# Patient Record
Sex: Male | Born: 1945
Health system: Southern US, Community
[De-identification: ages and names within clinical notes are randomized; demographics above are authoritative.]

## PROBLEM LIST (undated history)

## (undated) DIAGNOSIS — J449 Chronic obstructive pulmonary disease, unspecified: Secondary | ICD-10-CM

## (undated) DIAGNOSIS — M109 Gout, unspecified: Secondary | ICD-10-CM

## (undated) DIAGNOSIS — Z5189 Encounter for other specified aftercare: Secondary | ICD-10-CM

## (undated) DIAGNOSIS — M47816 Spondylosis without myelopathy or radiculopathy, lumbar region: Secondary | ICD-10-CM

## (undated) DIAGNOSIS — I2510 Atherosclerotic heart disease of native coronary artery without angina pectoris: Secondary | ICD-10-CM

## (undated) DIAGNOSIS — D649 Anemia, unspecified: Secondary | ICD-10-CM

## (undated) DIAGNOSIS — C801 Malignant (primary) neoplasm, unspecified: Secondary | ICD-10-CM

## (undated) DIAGNOSIS — I714 Abdominal aortic aneurysm, without rupture, unspecified: Secondary | ICD-10-CM

## (undated) DIAGNOSIS — E785 Hyperlipidemia, unspecified: Secondary | ICD-10-CM

## (undated) DIAGNOSIS — I712 Thoracic aortic aneurysm, without rupture, unspecified: Secondary | ICD-10-CM

## (undated) DIAGNOSIS — I219 Acute myocardial infarction, unspecified: Secondary | ICD-10-CM

## (undated) DIAGNOSIS — K432 Incisional hernia without obstruction or gangrene: Secondary | ICD-10-CM

## (undated) DIAGNOSIS — D72829 Elevated white blood cell count, unspecified: Secondary | ICD-10-CM

## (undated) DIAGNOSIS — I10 Essential (primary) hypertension: Secondary | ICD-10-CM

## (undated) DIAGNOSIS — K219 Gastro-esophageal reflux disease without esophagitis: Secondary | ICD-10-CM

## (undated) DIAGNOSIS — N189 Chronic kidney disease, unspecified: Secondary | ICD-10-CM

## (undated) DIAGNOSIS — E039 Hypothyroidism, unspecified: Secondary | ICD-10-CM

## (undated) HISTORY — DX: Chronic kidney disease, unspecified: N18.9

## (undated) HISTORY — DX: Spondylosis without myelopathy or radiculopathy, lumbar region: M47.816

## (undated) HISTORY — PX: JOINT REPLACEMENT: SHX530

## (undated) HISTORY — DX: Gastro-esophageal reflux disease without esophagitis: K21.9

## (undated) HISTORY — DX: Gout, unspecified: M10.9

## (undated) HISTORY — DX: Elevated white blood cell count, unspecified: D72.829

## (undated) HISTORY — DX: Atherosclerotic heart disease of native coronary artery without angina pectoris: I25.10

## (undated) HISTORY — DX: Essential (primary) hypertension: I10

## (undated) HISTORY — DX: Hypothyroidism, unspecified: E03.9

## (undated) HISTORY — DX: Hyperlipidemia, unspecified: E78.5

---

## 1995-11-14 HISTORY — PX: CORONARY ANGIOPLASTY WITH STENT PLACEMENT: SHX49

## 2001-02-26 ENCOUNTER — Encounter: Payer: Self-pay | Admitting: Cardiology

## 2001-02-26 ENCOUNTER — Ambulatory Visit (HOSPITAL_COMMUNITY): Admission: RE | Admit: 2001-02-26 | Discharge: 2001-02-26 | Payer: Self-pay | Admitting: Cardiology

## 2003-08-12 ENCOUNTER — Ambulatory Visit (HOSPITAL_COMMUNITY): Admission: RE | Admit: 2003-08-12 | Discharge: 2003-08-12 | Payer: Self-pay | Admitting: Family Medicine

## 2003-08-12 ENCOUNTER — Encounter: Payer: Self-pay | Admitting: Family Medicine

## 2004-11-13 HISTORY — PX: ABDOMINAL AORTIC ANEURYSM REPAIR: SUR1152

## 2005-05-23 ENCOUNTER — Ambulatory Visit: Payer: Self-pay

## 2005-05-29 ENCOUNTER — Encounter: Admission: RE | Admit: 2005-05-29 | Discharge: 2005-05-29 | Payer: Self-pay | Admitting: *Deleted

## 2005-06-20 ENCOUNTER — Inpatient Hospital Stay (HOSPITAL_COMMUNITY): Admission: RE | Admit: 2005-06-20 | Discharge: 2005-06-25 | Payer: Self-pay | Admitting: *Deleted

## 2005-06-20 ENCOUNTER — Encounter (INDEPENDENT_AMBULATORY_CARE_PROVIDER_SITE_OTHER): Payer: Self-pay | Admitting: *Deleted

## 2006-04-16 ENCOUNTER — Ambulatory Visit (HOSPITAL_COMMUNITY): Admission: RE | Admit: 2006-04-16 | Discharge: 2006-04-16 | Payer: Self-pay | Admitting: Urology

## 2006-11-13 HISTORY — PX: CORONARY ANGIOPLASTY WITH STENT PLACEMENT: SHX49

## 2006-11-13 HISTORY — PX: COLONOSCOPY: SHX174

## 2007-06-04 ENCOUNTER — Encounter: Payer: Self-pay | Admitting: Gastroenterology

## 2007-06-04 ENCOUNTER — Ambulatory Visit: Payer: Self-pay | Admitting: Gastroenterology

## 2007-06-04 ENCOUNTER — Ambulatory Visit (HOSPITAL_COMMUNITY): Admission: RE | Admit: 2007-06-04 | Discharge: 2007-06-04 | Payer: Self-pay | Admitting: Gastroenterology

## 2007-11-01 ENCOUNTER — Ambulatory Visit: Payer: Self-pay | Admitting: Cardiology

## 2008-11-18 ENCOUNTER — Ambulatory Visit: Payer: Self-pay | Admitting: Cardiology

## 2009-04-26 ENCOUNTER — Encounter: Payer: Self-pay | Admitting: Cardiology

## 2009-05-10 ENCOUNTER — Encounter: Payer: Self-pay | Admitting: Cardiology

## 2009-05-11 ENCOUNTER — Encounter: Payer: Self-pay | Admitting: Cardiology

## 2009-11-03 ENCOUNTER — Encounter: Payer: Self-pay | Admitting: Cardiology

## 2009-11-11 ENCOUNTER — Ambulatory Visit (HOSPITAL_COMMUNITY): Admission: RE | Admit: 2009-11-11 | Discharge: 2009-11-11 | Payer: Self-pay | Admitting: Internal Medicine

## 2010-02-02 DIAGNOSIS — Z9889 Other specified postprocedural states: Secondary | ICD-10-CM | POA: Insufficient documentation

## 2010-02-02 DIAGNOSIS — M109 Gout, unspecified: Secondary | ICD-10-CM

## 2010-02-02 DIAGNOSIS — K219 Gastro-esophageal reflux disease without esophagitis: Secondary | ICD-10-CM | POA: Insufficient documentation

## 2010-02-02 DIAGNOSIS — I10 Essential (primary) hypertension: Secondary | ICD-10-CM | POA: Insufficient documentation

## 2010-02-02 DIAGNOSIS — E039 Hypothyroidism, unspecified: Secondary | ICD-10-CM | POA: Insufficient documentation

## 2010-02-02 DIAGNOSIS — E785 Hyperlipidemia, unspecified: Secondary | ICD-10-CM | POA: Insufficient documentation

## 2010-02-02 DIAGNOSIS — I2510 Atherosclerotic heart disease of native coronary artery without angina pectoris: Secondary | ICD-10-CM

## 2010-02-09 ENCOUNTER — Ambulatory Visit: Payer: Self-pay | Admitting: Cardiology

## 2010-12-13 NOTE — Letter (Signed)
Summary: Dr Knute Neu Office Note  Dr Knute Neu Office Note   Imported By: Sallee Provencal 12/07/2009 11:41:31  _____________________________________________________________________  External Attachment:    Type:   Image     Comment:   External Document

## 2010-12-13 NOTE — Assessment & Plan Note (Signed)
Summary: f1y   Visit Type:  1 year follow up Primary Provider:  Willey Blade  CC:  No cardiac complains.  History of Present Illness: Has not been able to get out and do things because of the cold.  He is headed out on a cruise.  Denies any chest pain.    Current Medications (verified): 1)  Zocor 40 Mg Tabs (Simvastatin) .... Take 1 Tablet By Mouth Once A Day 2)  Hyzaar 50-12.5 Mg Tabs (Losartan Potassium-Hctz) .... Take 1 Tablet By Mouth Once A Day 3)  Synthroid 200 Mcg Tabs (Levothyroxine Sodium) .... Take 1 Tablet By Mouth Once A Day 4)  Metoprolol Succinate 50 Mg Xr24h-Tab (Metoprolol Succinate) .... Take 1/2 Tablet Two Times A Day 5)  Allopurinol 300 Mg Tabs (Allopurinol) .... Take 1 Tablet By Mouth Once A Day 6)  Omeprazole 20 Mg Tbec (Omeprazole) .... Take 1 Tablet By Mouth Once A Day 7)  Niaspan 750 Mg Cr-Tabs (Niacin (Antihyperlipidemic)) .... Take 1 Tablet By Mouth Two Times A Day 8)  Aspirin 81 Mg Tbec (Aspirin) .... Take One Tablet By Mouth Daily 9)  Multivitamins  Tabs (Multiple Vitamin) .... Take 1 Tablet By Mouth Once A Day 10)  Biotin 1000 Mcg Tabs (Biotin) .... Take 1 Tablet By Mouth Once A Day 11)  Cialis 20 Mg Tabs (Tadalafil) .... As Needed 12)  Aspirin 81 Mg Tbec (Aspirin) .... Take One Tablet By Mouth Daily 13)  Astaxanthin 4mg  Tablet .... Take 1 Tablet By Mouth Once A Day  Allergies (verified): 1)  ! Penicillin  Vital Signs:  Patient profile:   65 year old male Height:      72 inches Weight:      203.75 pounds BMI:     27.73 Pulse rate:   66 / minute Pulse rhythm:   regular Resp:     18 per minute BP sitting:   132 / 80  (left arm) Cuff size:   large  Vitals Entered By: Sidney Ace (February 09, 2010 12:27 PM)  Physical Exam  General:  Well developed, well nourished, in no acute distress. Head:  normocephalic and atraumatic Eyes:  PERRLA/EOM intact; conjunctiva and lids normal. Lungs:  Clear bilaterally to auscultation and percussion. Heart:  PMI non  displaced.  Normal S1 and S2.  No murmur.   Abdomen:  Small hernia in upper abdomen. Msk:  Back normal, normal gait. Muscle strength and tone normal. Pulses:  pulses normal in all 4 extremities Extremities:  No clubbing or cyanosis. Neurologic:  Alert and oriented x 3.   EKG  Procedure date:  02/09/2010  Findings:      NSR.  Nonspecific ST and T abnormaltiy.  Impression & Recommendations:  Problem # 1:  CORONARY ARTERY DISEASE (ICD-414.00) He has remained stable.  No symtoms.  Continue current medications.  His updated medication list for this problem includes:    Metoprolol Succinate 50 Mg Xr24h-tab (Metoprolol succinate) .[name]... Take 1/2 tablet two times a day    Aspirin 81 Mg Tbec (Aspirin) .[name]... Take one tablet by mouth daily  Orders: EKG w/ Interpretation (93000)  Problem # 2:  HYPERTENSION, UNSPECIFIED (ICD-401.9) Controlled. His updated medication list for this problem includes:    Hyzaar 50-12.5 Mg Tabs (Losartan potassium-hctz) .[name]... Take 1 tablet by mouth once a day    Metoprolol Succinate 50 Mg Xr24h-tab (Metoprolol succinate) .[name]... Take 1/2 tablet two times a day    Aspirin 81 Mg Tbec (Aspirin) .[name]... Take one tablet by mouth daily  Problem # 3:  HYPERLIPIDEMIA (B2193296.4) Followed nicely by Dr. Willey Blade His updated medication list for this problem includes:    Zocor 40 Mg Tabs (Simvastatin) .[name]... Take 1 tablet by mouth once a day    Niaspan 750 Mg Cr-tabs (Niacin (antihyperlipidemic)) .[name]... Take 1 tablet by mouth two times a day  Patient Instructions: 1)  Your physician recommends that you schedule a follow-up appointment as needed with Dr Lia Foyer 2)  Your physician recommends that you continue on your current medications as directed. Please refer to the Current Medication list given to you today.

## 2011-03-28 NOTE — Op Note (Signed)
NAME:  Jeremy Johnson, Jeremy Johnson               ACCOUNT NO.:  [id number]   MEDICAL RECORD NO.:  [id number]          PATIENT TYPE:  AMB   LOCATION:  DAY                           FACILITY:  [facility]   PHYSICIAN:  Caro Hight, M.D.      DATE OF BIRTH:  1946-01-11   DATE OF PROCEDURE:  06/04/2007  DATE OF DISCHARGE:                               OPERATIVE REPORT   REFERRING PHYSICIAN:  Paula Compton. Willey Blade, MD   PROCEDURE:  Colonoscopy with snare cautery polypectomy.   INDICATION FOR EXAM:  Jeremy Johnson is a 65 year old male who presents for  average-risk colon cancer screening.   FINDINGS:  1. A 6-mm descending colon polyp removed via snare cautery.  Otherwise      no masses, inflammatory changes, diverticula or AVMs seen.  2. Normal retroflexed view of the rectum.   RECOMMENDATIONS:  1. No NSAIDs, aspirin or any anticoagulation for 5 days.  2. He should follow a high-fiber diet.  He is given a handout on high-      fiber diet and polyps.  3. We will call Jeremy Johnson with the results of his biopsies.  If this      polyp is adenomatous, then his next screening colonoscopy should be      in 5 years.  If his polyp is adenomatous, then his siblings and      children should have a screening colonoscopy at age 82 and then      every 10 years.   MEDICATIONS:  1. Demerol 75 mg IV.  2. Versed 5 mg IV.   PROCEDURE TECHNIQUE:  Physical exam was performed and informed consent  was obtained from the patient after explaining the benefits, risks and  alternatives to the procedure.  The patient was connected to the monitor  and placed in the left lateral position.  Continuous oxygen was provided  by nasal cannula and IV medicine administered through an indwelling  cannula.  After administration of sedation and rectal exam, the  patient's rectum was intubated  and the scope was advanced under direct visualization to the cecum.  The  scope was removed slowly by carefully examining the color, texture,  anatomy and  integrity of the mucosa on the way out.  The patient was  recovered in endoscopy and discharged home in satisfactory condition.      Caro Hight, M.D.  Electronically Signed     SM/MEDQ  D:  06/04/2007  T:  06/04/2007  Job:  [id number]   cc:   Paula Compton. Willey Blade, MD  Fax: [phone]

## 2011-03-28 NOTE — Letter (Signed)
November 01, 2007    Jeremy Johnson. Jeremy Blade, MD  [address]  [place], [place] [address]   RE:  [name], [name]  MRN:  [id number]  /  DOB:  [birth year]   Dear Jeremy Johnson:   I had the pleasure of seeing Jeremy Johnson in the office today in followup.  He clinically is stable.  He is doing quite well.  He has had an  abdominal aortic aneurysm resection.  He denies any significant chest  pain.  I know that he saw you recently, and you suggested the  possibility of him coming in to be seen.  Overall, his most recent lipid  numbers that we have have been really quite good.  The numbers from your  office revealed a glucose of 104, BUN 17, creatinine 1.45.  His  cholesterol was 120 with a triglycerides of 71, HDL of 41, and an LDL of  65.   MEDICATIONS:  1. Zocor 40 mg daily.  2. Hyzaar 50/12.5 daily.  3. Synthroid 200 mcg daily.  4. Aspirin 325 mg daily.  5. Metoprolol 50 mg one-half tablet p.o. b.i.d.  6. Niaspan 750 mg two tablets daily.  7. Allopurinol 300 mg daily.  8. Omeprazole 20 mg daily.   PHYSICAL EXAMINATION:  GENERAL:  He is alert and oriented in no acute  distress.  VITAL SIGNS:  Blood pressure 130/80, pulse 64 and regular.  LUNGS:  The lung fields are clear to auscultation and percussion.  CARDIOVASCULAR:  I do not appreciate a significant murmur at the present  time.  ABDOMEN:  He has a large abdominal scar.   The electrocardiogram demonstrates normal sinus rhythm and essentially  within normal limits.   In summary, Jeremy Johnson has underlying coronary artery disease last  studied in 2002.  At that time he had a 30% LAD lesion, 40 and 50%  disease of the circumflex at the b bifurcation, and a small nondominant  right coronary with 40-50% narrowing.  There was an ejection fraction of  56%.  He had a moderate infrarenal abdominal aortic aneurysm which has  now been resected and was not a candidate for stent grafting.  He has  been on dual lipid management therapy which includes  Zocor and Niaspan.  His numbers are so good.  He has flushing with Niaspan, and given the  potential for drug interaction, I suggested that he decrease his Niaspan  to 750 mg once daily as opposed to twice and get these numbers rechecked  in your office in about two months.  I have also suggested that he could  potentially decrease his aspirin to 81 mg daily.   We will see him back in followup in one year.  Please do not hesitate to  let me know if I can be of help in his management.    Sincerely,      Jeremy Johnson. Jeremy Foyer, MD, [facility]  Electronically Signed    TDS/MedQ  DD: 11/01/2007  DT: 11/03/2007  Job #: [id number]

## 2011-03-28 NOTE — Letter (Signed)
November 18, 2008    Paula Compton. Willey Blade, MD  [address]  [place], [place] [address]   RE:  MONTARIUS, GRIBBINS  MRN:  [id number]  /  DOB:  01/23/1946   Dear Carloyn Manner,   I had the pleasure of seeing the nice patient, Jeremy Johnson in the office  today in followup.  He really seems to be getting along quite well.  He  did recently lose his brother-in-law, but other than that he has been  relatively stable.  He denies any ongoing chest pain or any significant  cardiac symptoms.  As you know, his last catheterization was in 2002.  He has also had an abdominal aortic aneurysm repair.   CURRENT MEDICATIONS:  1. Zocor 40 mg daily.  2. Hyzaar 50/12.5 daily.  3. Synthroid 200 mcg daily.  4. Metoprolol 50 mg one half b.i.d.  5. Allopurinol 300 mg daily.  6. Omeprazole 20 mg daily.  7. Niaspan 750 mg daily.  8. Aspirin 81 mg daily.  9. Cialis p.r.n..   PHYSICAL EXAMINATION:  GENERAL:  He is alert and oriented, in no acute  distress.  VITAL SIGNS:  Blood pressure is 151/88, he says that at home it is much  lower, and pulse is 78.  LUNGS:  Lung fields are clear.  CARDIAC:  Rhythm is regular.  I do not appreciate a significant murmur.   EKG reveals normal sinus rhythm and minor nonspecific ST abnormality.   Overall, he appears to be doing well.  We plan to see him back in  followup in about 1 year, should have any problems in the interim,  please do not hesitate to let me know and I will be happy to see him at  any time.    Sincerely,      Loretha Brasil. Lia Foyer, MD, [facility]  Electronically Signed    TDS/MedQ  DD: 11/18/2008  DT: 11/19/2008  Job #: [id number]

## 2011-03-31 NOTE — Discharge Summary (Signed)
[name], [name]               ACCOUNT NO.:  [id number]   MEDICAL RECORD NO.:  [id number]          PATIENT TYPE:  INP   LOCATION:  2022                         FACILITY:  [facility]   PHYSICIAN:  Jeremy Johnson, M.D.    DATE OF BIRTH:  05-01-1946   DATE OF ADMISSION:  06/20/2005  DATE OF DISCHARGE:  06/25/2005                                 DISCHARGE SUMMARY   PRIMARY ADMISSION DIAGNOSIS:  Abdominal aortic aneurysm.   ADDITIONAL/DISCHARGE DIAGNOSES:  1.  Abdominal aortic aneurysm.  2.  Coronary artery disease, status post myocardial infarction in 1997.  3.  Hypertension.  4.  Hyperlipidemia.  5.  Hypothyroidism.  6.  Gout.   PROCEDURES PERFORMED:  Repair of infrarenal abdominal aortic aneurysm with  18 mm straight Dacron Hemashield graft.   HISTORY:  The patient is a 65 year old white male with a history of known  abdominal aortic aneurysm which has been followed by Dr. Amedeo Johnson since 2002.  He has had routine surveillance ultrasounds over the past several years, and  during this period of time the aneurysm has progressively grown now to a 5  cm diameter.  On his most recent visit, Dr. Amedeo Johnson ordered a CT scan  ________________5.9 cm maximal diameter abdominal aortic aneurysm.  The  patient was not felt to be a good candidate for stent graft repair.  It was  recommended that he proceed with open surgical repair at this time.  Because  of his previous history of coronary artery disease, he underwent a  Cardiolite prior to surgery which showed him to be low risk for surgery and  he was cleared to proceed.  Prior to his admission, Dr. Amedeo Johnson explained the  risks, benefits, and the alternatives to the procedure, and the patient did  consent to surgery at this time.   HOSPITAL COURSE:  The patient was admitted to [facility] on June 20, 2005, underwent repair of his abdominal aortic aneurysm by Dr. Amedeo Johnson as  described in detail above.  He tolerated the procedure well, was  extubated  intraoperatively, and transferred to the SICU in stable condition.  On  postoperative day #1, he was doing well.  He was hemodynamically stable and  was felt to be ready for transfer to the floor.  He has slowly made  progress.  By postoperative day #3, he was passing flatus and had active  bowel sounds.  He was started on a clear liquid diet.  He has remained  afebrile and all vital signs have been stable.  The surgical incision sites  are all healing well.  He is ambulating in the halls without difficulty.  It  is anticipated that his bowel function will continue to return over the next  24 to 48 hours, and as this occurs, his diet will be advanced accordingly.  Once he is tolerating a regular diet without difficulty, is having normal  bowel function, it is anticipated that he will be ready for discharge home  hopefully on June 25, 2005.  His most recent labs show a hemoglobin of 11,  hematocrit 33, white count  15, platelets 152.  Sodium 136, potassium 3.4,  BUN 28, creatinine 1.1.   DISCHARGE MEDICATIONS:  1.  Tylox one to two q.4h. p.r.n. pain.  2.  Niaspan 500 mg daily.  3.  Aspirin 325 mg daily.  4.  Zocor 40 mg daily.  5.  Hyzaar 50/12.5 mg daily.  6.  Metoprolol 50 mg 1/2 tab b.i.d.  7.  Allopurinol 100 mg daily.  8.  Synthroid 175 mcg daily.   DISCHARGE INSTRUCTIONS:  1.  He is asked to refrain from driving, heavy lifting, or strenuous      activity.  2.  He may continue ambulating daily and using his incentive spirometer.  3.  He may shower daily and clean his incisions with soap and water.  4.  He will continue his same preoperative diet.   DISCHARGE FOLLOWUP:  1.  He will see Dr. Amedeo Johnson back in the office on July 24, 2005, at 3:20      p.m.  2.  He will return to the CVTS office on Friday, June 30, 2005, at 10 a.m.      for staple removal and wound re-check by the nurse.  He will contact our      office in the interim if he experiences any problems  or has questions.      Jeremy Johnson, P.A.      Jeremy Johnson, M.D.  Electronically Signed    GC/MEDQ  D:  06/23/2005  T:  06/24/2005  Job:  [id number]   cc:   Jeremy [place], [place]  [place] [address]  Fax: Jeremy Johnson, M.D. [facility]  [address] [place]  [place] [address]

## 2011-03-31 NOTE — H&P (Signed)
NAME:  Jeremy Johnson, Jeremy Johnson               ACCOUNT NO.:  [id number]   MEDICAL RECORD NO.:  [id number]          PATIENT TYPE:  INP   LOCATION:  NA                           FACILITY:  [facility]   PHYSICIAN:  Dorothea Glassman, M.D.    DATE OF BIRTH:  Jul 20, 1946   DATE OF ADMISSION:  06/20/2005  DATE OF DISCHARGE:                                HISTORY & PHYSICAL   PRIMARY CARE PHYSICIAN:  Dr. Avelino Leeds   CARDIOLOGIST:  Dr. Bing Quarry   CHIEF COMPLAINT:  Abdominal aortic aneurysm.   HISTORY OF PRESENT ILLNESS:  This is a 65 year old Caucasian male who has  been followed with routine visits and ultrasounds since 2002 by Dr. Amedeo Plenty  for his known AAA.  Over this period of time he has remained stable and the  aneurysm has slowly, but progressively grown to 5 cm in maximal diameter.  Secondary to enlargement over the previous six months Dr. Amedeo Plenty ordered a CT  scan and a Cardiolite evaluation.  The CT verified a 5.9 cm maximal diameter  AAA.  He was not a candidate for a stent graft.  A Cardiolite revealed to be  a low risk preoperative study.  The patient was then scheduled for elective  repair of his abdominal aortic aneurysm secondary to enlargement.  Carotid  duplex and lower extremity arterial evaluation was performed on June 16, 2005 at [place] office.  Carotid duplex revealed no evidence of internal or  external carotid artery stenosis bilaterally and the lower extremity  arterial evaluation revealed no evidence of significant arterial occlusive  disease bilaterally.  Patient does complain of occasional reflux symptoms.  Otherwise, he denies abdominal pain, nausea, vomiting, constipation,  diarrhea, hematochezia, hematemesis, back pain, claudication symptoms,  dysuria, hematuria, angina, palpitations, and TIA/CVA symptoms.  Patient  presents to the CVTS office today for history and physical examination prior  to surgery.   PAST MEDICAL HISTORY:  1.  Coronary artery disease status post  myocardial infarction 1997, status      post percutaneous intervention.  2.  Hypertension.  3.  Abdominal aortic aneurysm.  4.  Hyperlipidemia.  5.  Hypothyroid.  6.  Gout in his bilateral feet.   PAST SURGICAL HISTORY:  None.   ALLERGIES:  PENICILLIN which causes a rash.   MEDICATIONS:  1.  Niaspan 1500 mg daily.  2.  Zocor 40 mg daily.  3.  Metoprolol 50 mg one-half tablet b.i.d.  4.  Hyzaar 50 per 12.5 mg daily.  5.  Synthroid 175 mcg daily.  6.  Aspirin 325 mg daily.  7.  Allopurinol 100 mg daily.   REVIEW OF SYSTEMS:  Please see HPI for significant positives and negatives,  otherwise negative for diabetes mellitus and kidney disease.   SOCIAL HISTORY:  This is a married adult with two children who lives with  his family.  Patient continues to smoke a half a pack per day and has done  so for greater than 30 years.  Patient admits to occasional alcohol use.  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   FAMILY HISTORY:  Significant only in his sister for diabetes mellitus.   PHYSICAL EXAMINATION:  VITAL SIGNS:  Blood pressure 138/88 left arm sitting,  heart rate 60, respirations 18.  GENERAL:  This is a 65 year old Caucasian male in no acute distress.  HEENT:  Normocephalic, atraumatic.  Pupils are equal, round, and reactive to  light and accommodation.  Extraocular movements are intact.  The oral mucosa  is pink and moist and the sclerae are non-icteric.  NECK:  Supple with no JVD, no bruits, no lymphadenopathy.  The carotids are  palpable.  LUNGS:  Respirations are symmetric, unlabored, and clear.  CARDIAC:  Regular rate and rhythm.  No murmurs.  No rubs.  No gallops.  ABDOMEN:  Soft, nontender, nondistended with normoactive bowel sounds and  there is a palpable abdominal aortic aneurysm.  There are no bruits  auscultated, however.  GENITOURINARY:  Deferred.  EXTREMITIES:  There is no  edema evident or varicosities, venous stasis  changes, or skin breakdown.  Temperature is warm in all extremities.  Pulses  radial, femoral, popliteal, and pedal pulses are 2+ bilaterally.  NEUROLOGIC:  Nonfocal.  Patient is alert and oriented x3.  The gait is  steady.  Muscle strength is 5/5 throughout all extremities and symmetric.  Deep tendon reflexes are 2+.   ASSESSMENT:  Abdominal aortic aneurysm.   PLAN:  Resection and grafting of abdominal aortic aneurysm on June 20, 2005  by Dr. Amedeo Plenty.  Dr. Amedeo Plenty has seen and evaluated this patient prior to this  admission and has explained the risks and benefits of the procedure and the  patient has agreed to continue.       AY/MEDQ  D:  06/16/2005  T:  06/16/2005  Job:  [id number]

## 2011-03-31 NOTE — Cardiovascular Report (Signed)
[facility]  Patient:    Jeremy Johnson, Jeremy Johnson                      MRN: [id number] Adm. Date:  [id number] Attending:  Wadie Lessen CC:         Marjean Donna, M.D.  Gordy Clement, M.D.  Loretha Brasil. Lia Foyer, M.D. [facility]  CV laboratory  Patients record   Cardiac Catheterization  PROCEDURES: 1. Left heart catheterization. 2. Selective coronary arteriography. 3. Selective left ventriculography. 4. Distal aortography.  INDICATIONS:  Mr. Schlueter is a delightful 65 year old well known to me.  He has had a prior percutaneous intervention following an inferolateral MI with spontaneous reperfusion.  He had a modestly abnormal Cardiolite scan and because of that was brought back to the catheterization laboratory.  He has had some recent chest tightness.  The current study was done to assess coronary anatomy.  DESCRIPTION OF PROCEDURE:  The procedure was performed from the right femoral artery using 6 French catheter.  He tolerated the procedure well.  Because of some coiling of the wire in the intrarenal abdominal aorta, we elected to take a picture of the distal aorta.  There was evidence of abdominal aortic aneurysm.  The procedure was completed without complication.  The patient was called to Dr. Gordy Clement for evaluation of the patients aneurysm.  He was taken to the holding area in satisfactory clinical condition.  RESULTS:  Hemodynamic data: 1. Central aortic pressures were 135/77. 2. LV pressure 143/11. 3. There is no gradient on pullback across the aortic valve.  Angiographic data: 1. Left ventriculography was performed in the RAO projection.  There  was    infrabasilar hypokinesis.  Ejection fraction was calculated at 56%.  There    was no significant mitral regurgitation noted. 2. The aorta demonstrated a moderate infrarenal abdominal aortic aneurysm    which appeared to be somewhat bilobed.  The size was uncertain. 3. The  left main coronary artery was free of critical disease. 4. The LAD demonstrates about a 30% area of narrowing in the mid-vessel at the    bifurcation of the diagonal.  No critical disease is noted in the LAD.    There is some mild circumflex calcification. 5. The circumflex demonstrates about 40% narrowing through the segment of the    stent leading into the first marginal branch.  The first marginal branch    has about a 50% proximal stenosis.  The distal vessel is a dominant vessel    and shows no critical disease.  There is minor luminal irregularity. 6. There is a small ramus intermedius vessel that bifurcates and was free of    critical disease. 7. The right coronary artery is a nondominant vessel.  It bifurcates into an    acute marginal and AV branch.  The acute marginal has about 40-50%    narrowing.  There was some proximal spasm not seen on the initial shots but    later noted at the tip of the catheter.  This was not felt to be    significant.  IMPRESSION: 1. No evidence of significant restenosis. 2. Abdominal aortic aneurysm, new finding.  PLAN:  Continue medical treatment and follow up with Dr. Marjean Donna. Referral to Dr. Gordy Clement, CVTS, for evaluation of possible abdominal aortic aneurysm management. DD:  02/26/01 TD:  02/27/01 Job: [id number]

## 2011-03-31 NOTE — Op Note (Signed)
NAME:  Jeremy Johnson, Jeremy Johnson               ACCOUNT NO.:  [id number]   MEDICAL RECORD NO.:  [id number]          PATIENT TYPE:  INP   LOCATION:  2304                         FACILITY:  [facility]   PHYSICIAN:  Dorothea Glassman, M.D.    DATE OF BIRTH:  [birth year]   DATE OF PROCEDURE:  06/20/2005  DATE OF DISCHARGE:                                 OPERATIVE REPORT   SURGEON:  Gordy Clement, MD.   ASSISTANT:  Leta Baptist, PA.   ANESTHETIC:  General endotracheal.   ANESTHESIOLOGIST:  Nelda Severe. Tobias Alexander, M.D.   PREOPERATIVE DIAGNOSIS:  A 5.8-cm infrarenal abdominal aortic aneurysm.   POSTOPERATIVE DIAGNOSIS:  A 5.8-cm infrarenal abdominal aortic aneurysm.   PROCEDURE:  Repair of infrarenal abdominal aortic aneurysm with 18-mm  straight Dacron Hemashield graft.   CLINICAL NOTE:  Jeremy Johnson is a 65 year old male with a known  abdominal aortic aneurysm.  He has been followed in the office for some  time.  He recently presented with an aneurysm to have enlarged to 5.8 cm.  He has undergone a CT scan with CT angiogram.  Reconstructions revealed a  wide neck, he is not a candidate for a stent graft.  He is brought to the  operating room at this time for open operative repair.  Risks the operative  procedure with a major morbidity and mortality of approximately 5%  have  been discussed with the patient.   PROCEDURAL NOTE:  The patient was brought to the operating room in stable  condition.  Placed in a supine position.  General endotracheal anesthesia  induced.  Foley catheter, arterial line and Swan-Ganz catheter in place.   In a supine position, general endotracheal anesthesia induced.  The abdomen  and both legs were prepped and draped in a sterile fashion.   A longitudinal skin incision was made from xiphoid to umbilicus.  Dissection  carried down through subcutaneous tissue with electrocautery.  Linea alba  incised.  Peritoneal cavity entered without difficulty.   Full laparotomy  evaluation carried out.  The stomach and duodenum were  normal.  Liver, gallbladder, bile ducts and pancreas all normal.   The small bowel was unremarkable.  Large bowel revealed no masses.  There  were a few filmy adhesions noted in the right lower quadrant.   The retroperitoneum did reveal a large abdominal aortic aneurysm, this was  consistent with 5.8 cm.   The small bowel was retracted to the right, the transverse colon brought  superiorly.  The retroperitoneum incised over the neck of the aneurysm sac.  The inferior mesenteric vein, the left renal vein were both skeletonized and  retracted superiorly.  The neck of the aneurysm exposed up to the renal  arteries.  There was an adequate infrarenal neck for an infrarenal clamp  placement.  Dissection then carried distally down along the aneurysm sac to  the bifurcation.  The common iliac arteries bilaterally were exposed and  encircled Vesseloops.  These were normal in caliber.  Minimal plaque disease  was present in the iliac arteries.   The patient was administered 5000 units of  heparin intravenously, 25 grams  of mannitol intravenously.  The infrarenal aorta controlled with an aortic  DeBakey clamp.  The common iliac arteries bilaterally controlled with  coarctation clamps.   A longitudinal incision was made through the aneurysm sac.  Backbleeding  inferior mesenteric artery was controlled with a figure-of-eight 2-0 silk  suture.  Backbleeding lumbar vessels were controlled with figure-of-eight 2-  0 silk suture.   The aneurysm sac was opened up to the neck where it was T'd.  Down to the  bifurcation, it was also T'd.   An 18-mm straight graft was chosen.  This was Hemashield graft.  This was  anastomosed end-to-end to the infrarenal aorta using running 3-0 Prolene  suture.  At completion of the proximal anastomosis, the graft was flushed.  Proximal clamp removed and the graft controlled with a Fogarty clamp.   The attention  then placed on the bifurcation.  The graft was divided  transversely.  The graft was anastomosed end-to-end to the aortic  bifurcation using running 3-0 Prolene suture.  At completion of the distal  anastomosis, the graft was flushed.  The iliac arteries back-flushed.  Clamps were then removed and excellent flow present through the graft.  The  legs were well-perfused.  There were no apparent complications.  The patient  was administered 50 mg of protamine intravenously.  Adequate hemostasis  obtained.   The aneurysm sac was closed over the graft using running 2-0 Vicryl suture.   The abdomen was examined to ensure there were no retained instruments or  sponges.  The midline fascia closed using a running #1 PDS suture.  Skin  closed with staples.  Sterile dressings applied.   The patient tolerated the procedure well.  Transferred to recovery room in  stable condition.       PGH/MEDQ  D:  06/20/2005  T:  06/21/2005  Job:  [id number]

## 2011-11-24 DIAGNOSIS — E039 Hypothyroidism, unspecified: Secondary | ICD-10-CM | POA: Diagnosis not present

## 2011-11-24 DIAGNOSIS — E785 Hyperlipidemia, unspecified: Secondary | ICD-10-CM | POA: Diagnosis not present

## 2011-11-30 DIAGNOSIS — Z23 Encounter for immunization: Secondary | ICD-10-CM | POA: Diagnosis not present

## 2011-11-30 DIAGNOSIS — E785 Hyperlipidemia, unspecified: Secondary | ICD-10-CM | POA: Diagnosis not present

## 2011-11-30 DIAGNOSIS — E039 Hypothyroidism, unspecified: Secondary | ICD-10-CM | POA: Diagnosis not present

## 2012-04-22 ENCOUNTER — Encounter: Payer: Self-pay | Admitting: Gastroenterology

## 2012-05-24 DIAGNOSIS — E785 Hyperlipidemia, unspecified: Secondary | ICD-10-CM | POA: Diagnosis not present

## 2012-05-24 DIAGNOSIS — Z79899 Other long term (current) drug therapy: Secondary | ICD-10-CM | POA: Diagnosis not present

## 2012-05-24 DIAGNOSIS — I2510 Atherosclerotic heart disease of native coronary artery without angina pectoris: Secondary | ICD-10-CM | POA: Diagnosis not present

## 2012-05-24 DIAGNOSIS — E039 Hypothyroidism, unspecified: Secondary | ICD-10-CM | POA: Diagnosis not present

## 2012-05-24 DIAGNOSIS — Z125 Encounter for screening for malignant neoplasm of prostate: Secondary | ICD-10-CM | POA: Diagnosis not present

## 2012-06-10 DIAGNOSIS — E785 Hyperlipidemia, unspecified: Secondary | ICD-10-CM | POA: Diagnosis not present

## 2012-06-10 DIAGNOSIS — E039 Hypothyroidism, unspecified: Secondary | ICD-10-CM | POA: Diagnosis not present

## 2012-06-10 DIAGNOSIS — I2510 Atherosclerotic heart disease of native coronary artery without angina pectoris: Secondary | ICD-10-CM | POA: Diagnosis not present

## 2012-06-10 DIAGNOSIS — Z1212 Encounter for screening for malignant neoplasm of rectum: Secondary | ICD-10-CM | POA: Diagnosis not present

## 2012-08-16 DIAGNOSIS — Z23 Encounter for immunization: Secondary | ICD-10-CM | POA: Diagnosis not present

## 2012-12-02 DIAGNOSIS — Z79899 Other long term (current) drug therapy: Secondary | ICD-10-CM | POA: Diagnosis not present

## 2012-12-02 DIAGNOSIS — E785 Hyperlipidemia, unspecified: Secondary | ICD-10-CM | POA: Diagnosis not present

## 2012-12-09 DIAGNOSIS — I10 Essential (primary) hypertension: Secondary | ICD-10-CM | POA: Diagnosis not present

## 2012-12-09 DIAGNOSIS — K272 Acute peptic ulcer, site unspecified, with both hemorrhage and perforation: Secondary | ICD-10-CM | POA: Diagnosis not present

## 2013-06-12 DIAGNOSIS — E785 Hyperlipidemia, unspecified: Secondary | ICD-10-CM | POA: Diagnosis not present

## 2013-06-12 DIAGNOSIS — Z79899 Other long term (current) drug therapy: Secondary | ICD-10-CM | POA: Diagnosis not present

## 2013-06-12 DIAGNOSIS — Z125 Encounter for screening for malignant neoplasm of prostate: Secondary | ICD-10-CM | POA: Diagnosis not present

## 2013-06-12 DIAGNOSIS — I2510 Atherosclerotic heart disease of native coronary artery without angina pectoris: Secondary | ICD-10-CM | POA: Diagnosis not present

## 2013-06-12 DIAGNOSIS — E039 Hypothyroidism, unspecified: Secondary | ICD-10-CM | POA: Diagnosis not present

## 2013-06-19 DIAGNOSIS — E039 Hypothyroidism, unspecified: Secondary | ICD-10-CM | POA: Diagnosis not present

## 2013-06-19 DIAGNOSIS — Z1212 Encounter for screening for malignant neoplasm of rectum: Secondary | ICD-10-CM | POA: Diagnosis not present

## 2013-06-19 DIAGNOSIS — I714 Abdominal aortic aneurysm, without rupture: Secondary | ICD-10-CM | POA: Diagnosis not present

## 2013-06-19 DIAGNOSIS — I2510 Atherosclerotic heart disease of native coronary artery without angina pectoris: Secondary | ICD-10-CM | POA: Diagnosis not present

## 2013-06-19 DIAGNOSIS — E785 Hyperlipidemia, unspecified: Secondary | ICD-10-CM | POA: Diagnosis not present

## 2013-06-19 DIAGNOSIS — Z0000 Encounter for general adult medical examination without abnormal findings: Secondary | ICD-10-CM | POA: Diagnosis not present

## 2013-08-26 DIAGNOSIS — Z23 Encounter for immunization: Secondary | ICD-10-CM | POA: Diagnosis not present

## 2013-10-21 DIAGNOSIS — L570 Actinic keratosis: Secondary | ICD-10-CM | POA: Diagnosis not present

## 2013-10-21 DIAGNOSIS — C44319 Basal cell carcinoma of skin of other parts of face: Secondary | ICD-10-CM | POA: Diagnosis not present

## 2013-10-21 DIAGNOSIS — D235 Other benign neoplasm of skin of trunk: Secondary | ICD-10-CM | POA: Diagnosis not present

## 2013-12-03 DIAGNOSIS — Z85828 Personal history of other malignant neoplasm of skin: Secondary | ICD-10-CM | POA: Diagnosis not present

## 2013-12-15 DIAGNOSIS — E785 Hyperlipidemia, unspecified: Secondary | ICD-10-CM | POA: Diagnosis not present

## 2013-12-23 DIAGNOSIS — I714 Abdominal aortic aneurysm, without rupture, unspecified: Secondary | ICD-10-CM | POA: Diagnosis not present

## 2013-12-23 DIAGNOSIS — I10 Essential (primary) hypertension: Secondary | ICD-10-CM | POA: Diagnosis not present

## 2013-12-23 DIAGNOSIS — E785 Hyperlipidemia, unspecified: Secondary | ICD-10-CM | POA: Diagnosis not present

## 2013-12-23 DIAGNOSIS — N529 Male erectile dysfunction, unspecified: Secondary | ICD-10-CM | POA: Diagnosis not present

## 2014-03-17 DIAGNOSIS — L821 Other seborrheic keratosis: Secondary | ICD-10-CM | POA: Diagnosis not present

## 2014-03-17 DIAGNOSIS — Z85828 Personal history of other malignant neoplasm of skin: Secondary | ICD-10-CM | POA: Diagnosis not present

## 2014-06-17 DIAGNOSIS — E039 Hypothyroidism, unspecified: Secondary | ICD-10-CM | POA: Diagnosis not present

## 2014-06-17 DIAGNOSIS — Z79899 Other long term (current) drug therapy: Secondary | ICD-10-CM | POA: Diagnosis not present

## 2014-06-17 DIAGNOSIS — I2510 Atherosclerotic heart disease of native coronary artery without angina pectoris: Secondary | ICD-10-CM | POA: Diagnosis not present

## 2014-06-17 DIAGNOSIS — Z125 Encounter for screening for malignant neoplasm of prostate: Secondary | ICD-10-CM | POA: Diagnosis not present

## 2014-06-17 DIAGNOSIS — E785 Hyperlipidemia, unspecified: Secondary | ICD-10-CM | POA: Diagnosis not present

## 2014-06-25 DIAGNOSIS — Z0000 Encounter for general adult medical examination without abnormal findings: Secondary | ICD-10-CM | POA: Diagnosis not present

## 2014-08-31 DIAGNOSIS — Z23 Encounter for immunization: Secondary | ICD-10-CM | POA: Diagnosis not present

## 2014-09-30 DIAGNOSIS — Z08 Encounter for follow-up examination after completed treatment for malignant neoplasm: Secondary | ICD-10-CM | POA: Diagnosis not present

## 2014-09-30 DIAGNOSIS — L821 Other seborrheic keratosis: Secondary | ICD-10-CM | POA: Diagnosis not present

## 2014-09-30 DIAGNOSIS — Z85828 Personal history of other malignant neoplasm of skin: Secondary | ICD-10-CM | POA: Diagnosis not present

## 2014-10-05 DIAGNOSIS — Z23 Encounter for immunization: Secondary | ICD-10-CM | POA: Diagnosis not present

## 2014-12-21 DIAGNOSIS — E039 Hypothyroidism, unspecified: Secondary | ICD-10-CM | POA: Diagnosis not present

## 2014-12-28 DIAGNOSIS — E030 Congenital hypothyroidism with diffuse goiter: Secondary | ICD-10-CM | POA: Diagnosis not present

## 2014-12-28 DIAGNOSIS — I2510 Atherosclerotic heart disease of native coronary artery without angina pectoris: Secondary | ICD-10-CM | POA: Diagnosis not present

## 2015-06-04 DIAGNOSIS — Z6830 Body mass index (BMI) 30.0-30.9, adult: Secondary | ICD-10-CM | POA: Diagnosis not present

## 2015-06-04 DIAGNOSIS — H109 Unspecified conjunctivitis: Secondary | ICD-10-CM | POA: Diagnosis not present

## 2015-06-23 DIAGNOSIS — N181 Chronic kidney disease, stage 1: Secondary | ICD-10-CM | POA: Diagnosis not present

## 2015-06-23 DIAGNOSIS — H524 Presbyopia: Secondary | ICD-10-CM | POA: Diagnosis not present

## 2015-06-23 DIAGNOSIS — E785 Hyperlipidemia, unspecified: Secondary | ICD-10-CM | POA: Diagnosis not present

## 2015-06-23 DIAGNOSIS — I2510 Atherosclerotic heart disease of native coronary artery without angina pectoris: Secondary | ICD-10-CM | POA: Diagnosis not present

## 2015-06-23 DIAGNOSIS — Z125 Encounter for screening for malignant neoplasm of prostate: Secondary | ICD-10-CM | POA: Diagnosis not present

## 2015-06-23 DIAGNOSIS — I10 Essential (primary) hypertension: Secondary | ICD-10-CM | POA: Diagnosis not present

## 2015-06-30 DIAGNOSIS — S0502XA Injury of conjunctiva and corneal abrasion without foreign body, left eye, initial encounter: Secondary | ICD-10-CM | POA: Diagnosis not present

## 2015-07-01 DIAGNOSIS — Z0001 Encounter for general adult medical examination with abnormal findings: Secondary | ICD-10-CM | POA: Diagnosis not present

## 2015-07-01 DIAGNOSIS — I2510 Atherosclerotic heart disease of native coronary artery without angina pectoris: Secondary | ICD-10-CM | POA: Diagnosis not present

## 2015-07-01 DIAGNOSIS — Z6829 Body mass index (BMI) 29.0-29.9, adult: Secondary | ICD-10-CM | POA: Diagnosis not present

## 2015-07-01 DIAGNOSIS — N183 Chronic kidney disease, stage 3 (moderate): Secondary | ICD-10-CM | POA: Diagnosis not present

## 2015-07-01 DIAGNOSIS — I10 Essential (primary) hypertension: Secondary | ICD-10-CM | POA: Diagnosis not present

## 2015-08-13 DIAGNOSIS — Z23 Encounter for immunization: Secondary | ICD-10-CM | POA: Diagnosis not present

## 2015-12-27 DIAGNOSIS — I2510 Atherosclerotic heart disease of native coronary artery without angina pectoris: Secondary | ICD-10-CM | POA: Diagnosis not present

## 2015-12-27 DIAGNOSIS — Z79899 Other long term (current) drug therapy: Secondary | ICD-10-CM | POA: Diagnosis not present

## 2015-12-27 DIAGNOSIS — N181 Chronic kidney disease, stage 1: Secondary | ICD-10-CM | POA: Diagnosis not present

## 2016-01-03 DIAGNOSIS — N183 Chronic kidney disease, stage 3 (moderate): Secondary | ICD-10-CM | POA: Diagnosis not present

## 2016-01-03 DIAGNOSIS — M25551 Pain in right hip: Secondary | ICD-10-CM | POA: Diagnosis not present

## 2016-01-17 DIAGNOSIS — J111 Influenza due to unidentified influenza virus with other respiratory manifestations: Secondary | ICD-10-CM | POA: Diagnosis not present

## 2016-03-09 ENCOUNTER — Other Ambulatory Visit (HOSPITAL_COMMUNITY): Payer: Self-pay | Admitting: Internal Medicine

## 2016-03-09 ENCOUNTER — Ambulatory Visit (HOSPITAL_COMMUNITY)
Admission: RE | Admit: 2016-03-09 | Discharge: 2016-03-09 | Disposition: A | Discharge status: Home / Self Care | Payer: Medicare Other | Source: Ambulatory Visit | Attending: Internal Medicine | Admitting: Internal Medicine

## 2016-03-09 DIAGNOSIS — M25551 Pain in right hip: Secondary | ICD-10-CM

## 2016-03-09 DIAGNOSIS — M545 Low back pain: Secondary | ICD-10-CM | POA: Diagnosis not present

## 2016-03-09 DIAGNOSIS — M5441 Lumbago with sciatica, right side: Secondary | ICD-10-CM

## 2016-03-09 DIAGNOSIS — M5136 Other intervertebral disc degeneration, lumbar region: Secondary | ICD-10-CM | POA: Insufficient documentation

## 2016-07-05 DIAGNOSIS — E039 Hypothyroidism, unspecified: Secondary | ICD-10-CM | POA: Diagnosis not present

## 2016-07-05 DIAGNOSIS — Z125 Encounter for screening for malignant neoplasm of prostate: Secondary | ICD-10-CM | POA: Diagnosis not present

## 2016-07-05 DIAGNOSIS — I2510 Atherosclerotic heart disease of native coronary artery without angina pectoris: Secondary | ICD-10-CM | POA: Diagnosis not present

## 2016-07-05 DIAGNOSIS — Z79899 Other long term (current) drug therapy: Secondary | ICD-10-CM | POA: Diagnosis not present

## 2016-07-05 DIAGNOSIS — M109 Gout, unspecified: Secondary | ICD-10-CM | POA: Diagnosis not present

## 2016-07-13 DIAGNOSIS — I2510 Atherosclerotic heart disease of native coronary artery without angina pectoris: Secondary | ICD-10-CM | POA: Diagnosis not present

## 2016-07-13 DIAGNOSIS — Z0001 Encounter for general adult medical examination with abnormal findings: Secondary | ICD-10-CM | POA: Diagnosis not present

## 2016-07-13 DIAGNOSIS — N183 Chronic kidney disease, stage 3 (moderate): Secondary | ICD-10-CM | POA: Diagnosis not present

## 2016-08-29 DIAGNOSIS — Z23 Encounter for immunization: Secondary | ICD-10-CM | POA: Diagnosis not present

## 2016-12-20 DIAGNOSIS — H5203 Hypermetropia, bilateral: Secondary | ICD-10-CM | POA: Diagnosis not present

## 2016-12-20 DIAGNOSIS — H524 Presbyopia: Secondary | ICD-10-CM | POA: Diagnosis not present

## 2016-12-20 DIAGNOSIS — H2513 Age-related nuclear cataract, bilateral: Secondary | ICD-10-CM | POA: Diagnosis not present

## 2016-12-20 DIAGNOSIS — H52203 Unspecified astigmatism, bilateral: Secondary | ICD-10-CM | POA: Diagnosis not present

## 2017-01-05 DIAGNOSIS — I2510 Atherosclerotic heart disease of native coronary artery without angina pectoris: Secondary | ICD-10-CM | POA: Diagnosis not present

## 2017-01-05 DIAGNOSIS — Z79899 Other long term (current) drug therapy: Secondary | ICD-10-CM | POA: Diagnosis not present

## 2017-01-05 DIAGNOSIS — N181 Chronic kidney disease, stage 1: Secondary | ICD-10-CM | POA: Diagnosis not present

## 2017-01-05 DIAGNOSIS — I10 Essential (primary) hypertension: Secondary | ICD-10-CM | POA: Diagnosis not present

## 2017-01-10 DIAGNOSIS — M9903 Segmental and somatic dysfunction of lumbar region: Secondary | ICD-10-CM | POA: Diagnosis not present

## 2017-01-10 DIAGNOSIS — M9902 Segmental and somatic dysfunction of thoracic region: Secondary | ICD-10-CM | POA: Diagnosis not present

## 2017-01-10 DIAGNOSIS — M9905 Segmental and somatic dysfunction of pelvic region: Secondary | ICD-10-CM | POA: Diagnosis not present

## 2017-01-10 DIAGNOSIS — M545 Low back pain: Secondary | ICD-10-CM | POA: Diagnosis not present

## 2017-01-12 DIAGNOSIS — M545 Low back pain: Secondary | ICD-10-CM | POA: Diagnosis not present

## 2017-01-12 DIAGNOSIS — M9903 Segmental and somatic dysfunction of lumbar region: Secondary | ICD-10-CM | POA: Diagnosis not present

## 2017-01-12 DIAGNOSIS — N183 Chronic kidney disease, stage 3 (moderate): Secondary | ICD-10-CM | POA: Diagnosis not present

## 2017-01-12 DIAGNOSIS — M9902 Segmental and somatic dysfunction of thoracic region: Secondary | ICD-10-CM | POA: Diagnosis not present

## 2017-01-12 DIAGNOSIS — I2510 Atherosclerotic heart disease of native coronary artery without angina pectoris: Secondary | ICD-10-CM | POA: Diagnosis not present

## 2017-01-12 DIAGNOSIS — M9905 Segmental and somatic dysfunction of pelvic region: Secondary | ICD-10-CM | POA: Diagnosis not present

## 2017-01-15 DIAGNOSIS — M9905 Segmental and somatic dysfunction of pelvic region: Secondary | ICD-10-CM | POA: Diagnosis not present

## 2017-01-15 DIAGNOSIS — M545 Low back pain: Secondary | ICD-10-CM | POA: Diagnosis not present

## 2017-01-15 DIAGNOSIS — M9903 Segmental and somatic dysfunction of lumbar region: Secondary | ICD-10-CM | POA: Diagnosis not present

## 2017-01-15 DIAGNOSIS — M9902 Segmental and somatic dysfunction of thoracic region: Secondary | ICD-10-CM | POA: Diagnosis not present

## 2017-01-16 DIAGNOSIS — M9902 Segmental and somatic dysfunction of thoracic region: Secondary | ICD-10-CM | POA: Diagnosis not present

## 2017-01-16 DIAGNOSIS — M9905 Segmental and somatic dysfunction of pelvic region: Secondary | ICD-10-CM | POA: Diagnosis not present

## 2017-01-16 DIAGNOSIS — M9903 Segmental and somatic dysfunction of lumbar region: Secondary | ICD-10-CM | POA: Diagnosis not present

## 2017-01-16 DIAGNOSIS — M545 Low back pain: Secondary | ICD-10-CM | POA: Diagnosis not present

## 2017-01-19 DIAGNOSIS — M545 Low back pain: Secondary | ICD-10-CM | POA: Diagnosis not present

## 2017-01-19 DIAGNOSIS — M9902 Segmental and somatic dysfunction of thoracic region: Secondary | ICD-10-CM | POA: Diagnosis not present

## 2017-01-19 DIAGNOSIS — M9905 Segmental and somatic dysfunction of pelvic region: Secondary | ICD-10-CM | POA: Diagnosis not present

## 2017-01-19 DIAGNOSIS — M9903 Segmental and somatic dysfunction of lumbar region: Secondary | ICD-10-CM | POA: Diagnosis not present

## 2017-07-13 DIAGNOSIS — Z125 Encounter for screening for malignant neoplasm of prostate: Secondary | ICD-10-CM | POA: Diagnosis not present

## 2017-07-13 DIAGNOSIS — Z79899 Other long term (current) drug therapy: Secondary | ICD-10-CM | POA: Diagnosis not present

## 2017-07-13 DIAGNOSIS — I2510 Atherosclerotic heart disease of native coronary artery without angina pectoris: Secondary | ICD-10-CM | POA: Diagnosis not present

## 2017-07-13 DIAGNOSIS — R7301 Impaired fasting glucose: Secondary | ICD-10-CM | POA: Diagnosis not present

## 2017-07-13 DIAGNOSIS — I10 Essential (primary) hypertension: Secondary | ICD-10-CM | POA: Diagnosis not present

## 2017-07-13 DIAGNOSIS — E785 Hyperlipidemia, unspecified: Secondary | ICD-10-CM | POA: Diagnosis not present

## 2017-07-20 DIAGNOSIS — Z0001 Encounter for general adult medical examination with abnormal findings: Secondary | ICD-10-CM | POA: Diagnosis not present

## 2017-07-20 DIAGNOSIS — I10 Essential (primary) hypertension: Secondary | ICD-10-CM | POA: Diagnosis not present

## 2017-07-20 DIAGNOSIS — N183 Chronic kidney disease, stage 3 (moderate): Secondary | ICD-10-CM | POA: Diagnosis not present

## 2017-07-20 DIAGNOSIS — E785 Hyperlipidemia, unspecified: Secondary | ICD-10-CM | POA: Diagnosis not present

## 2017-08-09 ENCOUNTER — Other Ambulatory Visit: Payer: Self-pay | Admitting: Internal Medicine

## 2017-08-09 ENCOUNTER — Telehealth: Payer: Self-pay

## 2017-08-09 DIAGNOSIS — F17210 Nicotine dependence, cigarettes, uncomplicated: Secondary | ICD-10-CM

## 2017-08-09 NOTE — Telephone Encounter (Signed)
Pt called office, he received triage letter from [facility]. He can be reached at [phone].  Routing to DS.

## 2017-08-13 ENCOUNTER — Telehealth: Payer: Self-pay

## 2017-08-13 NOTE — Telephone Encounter (Signed)
See separate triage.  

## 2017-08-14 ENCOUNTER — Ambulatory Visit
Admission: RE | Admit: 2017-08-14 | Discharge: 2017-08-14 | Disposition: A | Discharge status: Home / Self Care | Payer: Medicare Other | Source: Ambulatory Visit | Attending: Internal Medicine | Admitting: Internal Medicine

## 2017-08-14 DIAGNOSIS — F17210 Nicotine dependence, cigarettes, uncomplicated: Secondary | ICD-10-CM

## 2017-08-14 NOTE — Telephone Encounter (Signed)
Appropriate.

## 2017-08-14 NOTE — Telephone Encounter (Signed)
Gastroenterology Pre-Procedure Review  Request Date: 08/13/2017 Requesting Physician: Dr. Willey Blade  PATIENT REVIEW QUESTIONS: The patient responded to the following health history questions as indicated:    1. Diabetes Melitis: no 2. Joint replacements in the past 12 months: no 3. Major health problems in the past 3 months: no 4. Has an artificial valve or MVP: no 5. Has a defibrillator: no 6. Has been advised in past to take antibiotics in advance of a procedure like teeth cleaning: no 7. Family history of colon cancer: no  8. Alcohol Use: no 9. History of sleep apnea: no  10. History of coronary artery or other vascular stents placed within the last 12 months: no 11. History of any prior anesthesia complications: no    MEDICATIONS & ALLERGIES:    Patient reports the following regarding taking any blood thinners:   Plavix? no Aspirin? no Coumadin? no Brilinta? no Xarelto? no Eliquis? no Pradaxa? no Savaysa? no Effient? no  Patient confirms/reports the following medications:  Current Outpatient Prescriptions  Medication Sig Dispense Refill  . allopurinol (ZYLOPRIM) 300 MG tablet Take 300 mg by mouth daily.    [name] aspirin EC 81 MG tablet Take 81 mg by mouth daily.    [name] levothyroxine (SYNTHROID, LEVOTHROID) 175 MCG tablet Take 175 mcg by mouth daily before breakfast.    . losartan-hydrochlorothiazide (HYZAAR) 100-25 MG tablet Take 1 tablet by mouth daily.    . metoprolol tartrate (LOPRESSOR) 50 MG tablet Take 50 mg by mouth 2 (two) times daily. Takes one half tablet bid    . omeprazole (PRILOSEC) 20 MG capsule Take 20 mg by mouth daily.    . simvastatin (ZOCOR) 40 MG tablet Take 40 mg by mouth daily.     No current facility-administered medications for this visit.     Patient confirms/reports the following allergies:  Allergies  Allergen Reactions  . Penicillins     No orders of the defined types were placed in this encounter.   AUTHORIZATION INFORMATION Primary  Insurance:   ID #:  Group #:  Pre-Cert / Auth required:  Pre-Cert / Auth #:   Secondary Insurance:   ID #:   Group #:  Pre-Cert / Auth required:  Pre-Cert / Auth #:  SCHEDULE INFORMATION: Procedure has been scheduled as follows:  Date:  09/17/2017              Time:  2:15 pm Location: [facility] Short Stay  This Gastroenterology Pre-Precedure Review Form is being routed to the following provider(s): Barney Drain, MD

## 2017-08-15 NOTE — Telephone Encounter (Signed)
FYI to Roseanne Kaufman, NP who signed off on the triage.

## 2017-08-15 NOTE — Telephone Encounter (Signed)
PT called and just had a CT scan and it showed an aortic aneurysm and he is gong to take care of that before he does the colonoscopy. I am taking off of the schedule for now. He will call when he is ready.

## 2017-09-04 DIAGNOSIS — Z23 Encounter for immunization: Secondary | ICD-10-CM | POA: Diagnosis not present

## 2017-09-11 ENCOUNTER — Encounter: Payer: Self-pay | Admitting: *Deleted

## 2017-09-11 ENCOUNTER — Encounter: Payer: Self-pay | Admitting: Cardiothoracic Surgery

## 2017-09-11 ENCOUNTER — Institutional Professional Consult (permissible substitution) (INDEPENDENT_AMBULATORY_CARE_PROVIDER_SITE_OTHER): Payer: Medicare Other | Admitting: Cardiothoracic Surgery

## 2017-09-11 VITALS — BP 162/76 | HR 64 | Resp 16 | Ht 70.0 in | Wt 187.0 lb

## 2017-09-11 DIAGNOSIS — E02 Subclinical iodine-deficiency hypothyroidism: Secondary | ICD-10-CM

## 2017-09-11 DIAGNOSIS — E039 Hypothyroidism, unspecified: Secondary | ICD-10-CM | POA: Insufficient documentation

## 2017-09-11 DIAGNOSIS — D72829 Elevated white blood cell count, unspecified: Secondary | ICD-10-CM | POA: Insufficient documentation

## 2017-09-11 DIAGNOSIS — I712 Thoracic aortic aneurysm, without rupture, unspecified: Secondary | ICD-10-CM

## 2017-09-11 DIAGNOSIS — I2510 Atherosclerotic heart disease of native coronary artery without angina pectoris: Secondary | ICD-10-CM | POA: Insufficient documentation

## 2017-09-11 DIAGNOSIS — I10 Essential (primary) hypertension: Secondary | ICD-10-CM

## 2017-09-11 DIAGNOSIS — M109 Gout, unspecified: Secondary | ICD-10-CM | POA: Insufficient documentation

## 2017-09-11 DIAGNOSIS — K219 Gastro-esophageal reflux disease without esophagitis: Secondary | ICD-10-CM | POA: Insufficient documentation

## 2017-09-11 DIAGNOSIS — E785 Hyperlipidemia, unspecified: Secondary | ICD-10-CM | POA: Insufficient documentation

## 2017-09-11 DIAGNOSIS — N189 Chronic kidney disease, unspecified: Secondary | ICD-10-CM | POA: Insufficient documentation

## 2017-09-11 DIAGNOSIS — M47816 Spondylosis without myelopathy or radiculopathy, lumbar region: Secondary | ICD-10-CM | POA: Insufficient documentation

## 2017-09-11 NOTE — Patient Instructions (Signed)

## 2017-09-11 NOTE — Progress Notes (Signed)
[address]       [place] [address]             [phone]                    Jeremy Johnson [place] Medical Record #[id number] Date of Birth: 09/22/1946  Referring: [name], MD Primary Care: [name], MD Cardiology: Dr Lia Foyer  Chief Complaint:    Chief Complaint  Patient presents with  . TAA    CT CHEST 08/14/17.Marland KitchenMarland KitchenNO ECHO    History of Present Illness:    Jeremy Johnson 71 y.o. male is seen in the office  today for evaluation of dilation of his arch.  The patient has had a known abdominal aneurysm repaired in 2006 by Dr. Amedeo Plenty.  Patient has a long history of smoking, and continues to smoke for more than 45 years currently half a pack a day.  Because of his long smoking history Dr. Willey Blade had recommended that he have a lung cancer screening CT. This was performed recently without contrast but did suggest a dilatation of the arch.    The patient does not have any family history of aortic dissection.  He did have an uncle who died suddenly in his 26s while in the [place] but with no details his mother at age 77 had a brain aneurysm bleed that was coiled she subsequently died at age 41 with dementia. [name]  History of CAD followed by Dr Lia Foyer, last seen by cardiology 02/09/2010  Current Activity/ Functional Status:  Patient is independent with mobility/ambulation, transfers, ADL's, IADL's.   Zubrod Score: At the time of surgery this patient's most appropriate activity status/level should be described as: []     0    Normal activity, no symptoms [x]     1    Restricted in physical strenuous activity but ambulatory, able to do out light work []     2    Ambulatory and capable of self care, unable to do work activities, up and about               >50 % of waking hours                              []     3    Only limited self care, in bed greater than 50% of waking hours []     4    Completely disabled, no self care, confined to bed or chair []     5     Moribund   Past Medical History:  Diagnosis Date  . CAD (coronary artery disease)    STENT... MID CIRCUMFLEX...1997  . Chronic kidney disease    STAGE 3  . Degenerative joint disease (DJD) of lumbar spine   . GERD (gastroesophageal reflux disease)   . Gout   . Hyperlipidemia   . Hypertension   . Hypothyroidism   . Leukocytosis    CHRONIC MILD    Past Surgical History:  Procedure Laterality Date  . ABDOMINAL AORTIC ANEURYSM REPAIR  2006  . COLONOSCOPY  2008  . CORONARY ANGIOPLASTY WITH STENT PLACEMENT  2008   MID CIRCUMFLEX    Family History  Problem Relation Age of Onset  . Stroke Brother     Social History   Social History  . Marital status: Married    Spouse name: N/A  . Number of children: N/A  .  Years of education: N/A   Occupational History  . Not on file.   Social History Main Topics  . Smoking status: Current Every Day Smoker    Packs/day: 0.50    Years: 54.00  . Smokeless tobacco: Never Used  . Alcohol use No  . Drug use: No  . Sexual activity: Not on file   Other Topics Concern  . Not on file   Social History Narrative  . No narrative on file    History  Smoking Status  . Current Every Day Smoker  . Packs/day: 0.50  . Years: 54.00  Smokeless Tobacco  . Never Used    History  Alcohol Use No     Allergies  Allergen Reactions  . Penicillins     Current Outpatient Prescriptions  Medication Sig Dispense Refill  . allopurinol (ZYLOPRIM) 300 MG tablet Take 300 mg by mouth daily.    [name] aspirin EC 81 MG tablet Take 81 mg by mouth daily.    [name] levothyroxine (SYNTHROID, LEVOTHROID) 175 MCG tablet Take 175 mcg by mouth daily before breakfast.    . losartan-hydrochlorothiazide (HYZAAR) 100-25 MG tablet Take 1 tablet by mouth daily.    . metoprolol tartrate (LOPRESSOR) 50 MG tablet Take 50 mg by mouth 2 (two) times daily. Takes one half tablet bid    . omeprazole (PRILOSEC) 20 MG capsule Take 20 mg by mouth daily.    . simvastatin (ZOCOR)  40 MG tablet Take 40 mg by mouth daily.     No current facility-administered medications for this visit.     Pertinent items are noted in HPI.   Review of Systems:     Cardiac Review of Systems: Y or N  Chest Pain [ n ]  Resting SOB [ n  ] Exertional SOB  [ y ]  Orthopnea [ n ]   Pedal Edema [ n  ]    Palpitations [ n ] Syncope  [ n ]   Presyncope [  n ]  General Review of Systems: [Y] = yes [  ]=no Constitional: recent weight change [ n ];  Wt loss over the last 3 months [   ] anorexia [  ]; fatigue [  ]; nausea [  ]; night sweats [  ]; fever [  ]; or chills [  ];          Dental: poor dentition[  ]; Last Dentist visit:   Eye : blurred vision [n  ]; diplopia [   ]; vision changes [  ];  Amaurosis fugax[  ]; Resp: cough [ n ];  wheezing[ n ];  hemoptysis[n  ]; shortness of breath[ y ]; paroxysmal nocturnal dyspnea[ n ]; dyspnea on exertion[  ]; or orthopnea[  ];  GI:  gallstones[n  ], vomiting[ n ];  dysphagia[  ]; melena[  ];  hematochezia [  ]; heartburn[  ];   Hx of  Colonoscopy[  ]; GU: kidney stones [  ]; hematuria[  ];   dysuria [  ];  nocturia[  ];  history of     obstruction [  ]; urinary frequency [  ]             Skin: rash, swelling[  ];, hair loss[  ];  peripheral edema[  ];  or itching[  ]; Musculosketetal: myalgias[  ];  joint swelling[  ];  joint erythema[  ];  joint pain[  ];  back pain[  ];  Heme/Lymph: bruising[  ];  bleeding[  ];  anemia[  ];  Neuro: TIA[ n ];  headaches[  ];  stroke[  ];  vertigo[  ];  seizures[  ];   paresthesias[  ];  difficulty walking[ n ];  Psych:depression[  ]; anxiety[  ];  Endocrine: diabetes[  ];  thyroid dysfunction[  ];  Immunizations: Flu up to date [ y ]; Pneumococcal up to date [ y ];  Other:  Physical Exam: BP (!) 162/76 (BP Location: Left Arm, Patient Position: Sitting, Cuff Size: Large)   Pulse 64   Resp 16   Ht 5\' 10"  (1.778 m)   Wt 187 lb (84.8 kg)   SpO2 98% Comment: ON RA  BMI 26.83 kg/m   PHYSICAL EXAMINATION: General  appearance: alert and cooperative Head: Normocephalic, without obvious abnormality, atraumatic Neck: no adenopathy, no carotid bruit, no JVD, supple, symmetrical, trachea midline and thyroid not enlarged, symmetric, no tenderness/mass/nodules Lymph nodes: Cervical, supraclavicular, and axillary nodes normal. Resp: clear to auscultation bilaterally Back: symmetric, no curvature. ROM normal. No CVA tenderness. Cardio: regular rate and rhythm, S1, S2 normal, no murmur, click, rub or gallop GI: soft, non-tender; bowel sounds normal; no masses,  no organomegaly Extremities: extremities normal, atraumatic, no cyanosis or edema and Homans sign is negative, no sign of DVT Neurologic: Grossly normal Patient has palpable DP and PT pulses bilaterally palpable brachial radial pulses bilaterally His previous aneurysm incision in the abdomen has a 5 cm fascial defect at the upper pole of, incisional hernia but without evidence of incarceration  Diagnostic Studies & Laboratory data:     Recent Radiology Findings:   Ct Chest Lung Ca Screen Low Dose W/o Cm  Result Date: 08/14/2017 CLINICAL DATA:  Current asymptomatic smoker. Forty pack-year history. EXAM: CT CHEST WITHOUT CONTRAST LOW-DOSE FOR LUNG CANCER SCREENING TECHNIQUE: Multidetector CT imaging of the chest was performed following the standard protocol without IV contrast. COMPARISON:  None. FINDINGS: Cardiovascular: The heart size is normal. Aortic atherosclerosis identified. Aneurysmal dilatation of the anterior arch measures 4.7 cm, image 21 of series 2. Calcifications within the left main coronary artery, LAD, left circumflex noted. No pericardial effusion. Mediastinum/Nodes: No enlarged mediastinal, hilar, or axillary lymph nodes. Thyroid gland, trachea, and esophagus demonstrate no significant findings. Lungs/Pleura: Moderate to advanced changes of centrilobular emphysema. Scattered nodules are identified the largest is in the right middle lobe with an  equivalent diameter and 6.4 mm. Upper Abdomen: No acute abnormality. Musculoskeletal: Degenerative disc disease noted within the thoracic spine. No aggressive lytic or sclerotic bone lesions. IMPRESSION: 1. Lung-RADS 2, benign appearance or behavior. Continue annual screening with low-dose chest CT without contrast in 12 months. 2. Aortic Atherosclerosis (ICD10-I70.0) and Emphysema (ICD10-J43.9). 3. Aortic aneurysm NOS (ICD10-I71.9). Aneurysmal dilatation of the anterior arch of the thoracic aorta is identified. Recommend semi-annual imaging followup by CTA or MRA and referral to cardiothoracic surgery if not already obtained. This recommendation follows 2010 ACCF/AHA/AATS/ACR/ASA/SCA/SCAI/SIR/STS/SVM Guidelines for the Diagnosis and Management of Patients With Thoracic Aortic Disease. Circulation. 2010; 121: [phone] Electronically Signed   By: Kerby Moors M.D.   On: 08/14/2017 16:00    I have independently reviewed the above radiology studies  and reviewed the findings with the patient.     Recent Lab Findings: No results found for: WBC, HGB, HCT, PLT, GLUCOSE, CHOL, TRIG, HDL, LDLDIRECT, LDLCALC, ALT, AST, NA, K, CL, CREATININE, BUN, CO2, TSH, INR, GLUF, HGBA1C   Aortic Size Index=     4.7    /Body surface area is 2.05 meters  squared. = 2.29 < 2.75 cm/m2      4% risk per year 2.75 to 4.25          8% risk per year > 4.25 cm/m2    20% risk per year     Assessment / Plan:   1/Aneurysmal dilatation of the anterior arch measures 4.7 cm, on oncontrast ct of chest 2/ Calcifications within the left main coronary artery, LAD, left circumflex- history of CAD with MI in 1999 with stent placement   3/ moderate to advanced changes of centrilobular emphysema on ct of chest   Diagnosis of dilated aortic arch was reviewed with the patient and his wife in detail, including the need to continue with good blood pressure control ideally with beta-blocker as he is doing, and to avoid cigarette smoking were  all discussed with the patient.  He expresses a desire to stop smoking.  I recommended to the patient that in 6 months we obtain a CTA of the chest to fully evaluate the thoracic aorta.  Currently the ascending and descending aorta do not appear involved.  Patient was warned about not using Cipro and similar antibiotics. Recent studies have raised concern that fluoroquinolone antibiotics could be associated with an increased risk of aortic aneurysm Fluoroquinolones have non-antimicrobial properties that might jeopardise the integrity of the extracellular matrix of the vascular wall In a  propensity score matched cohort study in [place], there was a 66% increased rate of aortic aneurysm or dissection associated with oral fluoroquinolone use, compared with amoxicillin use, within a 60 day risk period from start of treatment    I  spent 40 minutes counseling the patient face to face and 50% or more the  time was spent in counseling and coordination of care. The total time spent in the appointment was 60 minutes.  Grace Isaac MD      [address] [place] [address] Office [phone]   Beeper [phone]  09/11/2017 11:26 AM

## 2017-09-13 ENCOUNTER — Encounter: Payer: 59 | Admitting: Cardiothoracic Surgery

## 2017-10-23 DIAGNOSIS — E039 Hypothyroidism, unspecified: Secondary | ICD-10-CM | POA: Diagnosis not present

## 2017-10-30 DIAGNOSIS — I10 Essential (primary) hypertension: Secondary | ICD-10-CM | POA: Diagnosis not present

## 2017-10-30 DIAGNOSIS — E039 Hypothyroidism, unspecified: Secondary | ICD-10-CM | POA: Diagnosis not present

## 2017-10-30 DIAGNOSIS — I712 Thoracic aortic aneurysm, without rupture: Secondary | ICD-10-CM | POA: Diagnosis not present

## 2017-11-20 DIAGNOSIS — I10 Essential (primary) hypertension: Secondary | ICD-10-CM | POA: Diagnosis not present

## 2017-11-20 DIAGNOSIS — I712 Thoracic aortic aneurysm, without rupture: Secondary | ICD-10-CM | POA: Diagnosis not present

## 2018-02-12 DIAGNOSIS — I712 Thoracic aortic aneurysm, without rupture: Secondary | ICD-10-CM | POA: Diagnosis not present

## 2018-02-12 DIAGNOSIS — I10 Essential (primary) hypertension: Secondary | ICD-10-CM | POA: Diagnosis not present

## 2018-03-05 ENCOUNTER — Other Ambulatory Visit: Payer: Self-pay | Admitting: Cardiothoracic Surgery

## 2018-03-05 DIAGNOSIS — I712 Thoracic aortic aneurysm, without rupture, unspecified: Secondary | ICD-10-CM

## 2018-04-02 ENCOUNTER — Other Ambulatory Visit: Payer: Self-pay

## 2018-04-02 ENCOUNTER — Other Ambulatory Visit: Payer: Self-pay | Admitting: Cardiothoracic Surgery

## 2018-04-02 ENCOUNTER — Ambulatory Visit (INDEPENDENT_AMBULATORY_CARE_PROVIDER_SITE_OTHER): Payer: Medicare Other | Admitting: Cardiothoracic Surgery

## 2018-04-02 ENCOUNTER — Encounter: Payer: Self-pay | Admitting: Cardiothoracic Surgery

## 2018-04-02 ENCOUNTER — Ambulatory Visit
Admission: RE | Admit: 2018-04-02 | Discharge: 2018-04-02 | Disposition: A | Discharge status: Home / Self Care | Payer: Medicare Other | Source: Ambulatory Visit | Attending: Cardiothoracic Surgery | Admitting: Cardiothoracic Surgery

## 2018-04-02 VITALS — BP 145/88 | HR 72 | Ht 70.0 in | Wt 200.4 lb

## 2018-04-02 DIAGNOSIS — I712 Thoracic aortic aneurysm, without rupture, unspecified: Secondary | ICD-10-CM

## 2018-04-02 DIAGNOSIS — J439 Emphysema, unspecified: Secondary | ICD-10-CM | POA: Diagnosis not present

## 2018-04-02 NOTE — Progress Notes (Signed)
[address]       [name],Jeremy [place] [address]             [phone]                    Jeremy Johnson [place] Medical Record #[id number] Date of Birth: 1946/01/22  Referring: Jeremy Noble, MD Primary Care: Jeremy Noble, MD Cardiology: Dr Jeremy Johnson  Chief Complaint:    Chief Complaint  Patient presents with  . Thoracic Aortic Aneurysm    f/u with chest CT today    History of Present Illness:    Jeremy Johnson 72 y.o. male is seen in the office  today for follow-up evaluation evaluation of dilation of his arch.  Patient comes in today with a follow-up CTA of the chest.  Since last seen fall 2018 the patient has stopped smoking, and remain smoke-free.  Patient denies any cardiac symptoms, no angina or evidence of congestive heart failure.    Patient had a abdominal aneurysm repaired in 2006 by Dr. Amedeo Johnson.  Because of his long smoking history Dr. Willey Blade had recommended that he have a lung cancer screening CT this was done in October 2018. This was performed recently without contrast but did suggest a dilatation of the arch.    The patient does not have any family history of aortic dissection.  He did have an uncle who died suddenly in his 57s while in the [place] but with no details his mother at age 35 had a brain aneurysm bleed that was coiled she subsequently died at age 22 with dementia. Jeremy Johnson  History of CAD followed by Dr Jeremy Johnson, last seen by cardiology 02/09/2010  Current Activity/ Functional Status:  Patient is independent with mobility/ambulation, transfers, ADL's, IADL's.   Zubrod Score: At the time of surgery this patient's most appropriate activity status/level should be described as: []     0    Normal activity, no symptoms [x]     1    Restricted in physical strenuous activity but ambulatory, able to do out light work []     2    Ambulatory and capable of self care, unable to do work activities, up and about               >50 % of waking hours                               []     3    Only limited self care, in bed greater than 50% of waking hours []     4    Completely disabled, no self care, confined to bed or chair []     5    Moribund   Past Medical History:  Diagnosis Date  . CAD (coronary artery disease)    STENT... MID CIRCUMFLEX...1997  . Chronic kidney disease    STAGE 3  . Degenerative joint disease (DJD) of lumbar spine   . GERD (gastroesophageal reflux disease)   . Gout   . Hyperlipidemia   . Hypertension   . Hypothyroidism   . Leukocytosis    CHRONIC MILD      Family History  Problem Relation Age of Onset  . Stroke Brother     Social History   Socioeconomic History  . Marital status: Married    Spouse name: Not on file  . Number of children: Not on file  . Years of  education: Not on file  . Highest education level: Not on file  Occupational History  . Not on file  Social Needs  . Financial resource strain: Not on file  . Food insecurity:    Worry: Not on file    Inability: Not on file  . Transportation needs:    Medical: Not on file    Non-medical: Not on file  Tobacco Use  . Smoking status: Former Smoker    Packs/day: 0.50    Years: 54.00    Pack years: 27.00    Last attempt to quit: 09/17/2017    Years since quitting: 0.5  . Smokeless tobacco: Never Used  Substance and Sexual Activity  . Alcohol use: No  . Drug use: No  . Sexual activity: Not on file  Lifestyle  . Physical activity:    Days per week: Not on file    Minutes per session: Not on file  . Stress: Not on file  Relationships  . Social connections:    Talks on phone: Not on file    Gets together: Not on file    Attends religious service: Not on file    Active member of club or organization: Not on file    Attends meetings of clubs or organizations: Not on file    Relationship status: Not on file  . Intimate partner violence:    Fear of current or ex partner: Not on file    Emotionally abused: Not on file    Physically abused:  Not on file    Forced sexual activity: Not on file  Other Topics Concern  . Not on file  Social History Narrative  . Not on file    Social History   Tobacco Use  Smoking Status Former Smoker  . Packs/day: 0.50  . Years: 54.00  . Pack years: 27.00  . Last attempt to quit: 09/17/2017  . Years since quitting: 0.5  Smokeless Tobacco Never Used    Social History   Substance and Sexual Activity  Alcohol Use No     Allergies  Allergen Reactions  . Penicillins     Current Outpatient Medications  Medication Sig Dispense Refill  . allopurinol (ZYLOPRIM) 300 MG tablet Take 300 mg by mouth daily.    Jeremy Johnson amLODipine (NORVASC) 5 MG tablet     . aspirin EC 81 MG tablet Take 81 mg by mouth daily.    Jeremy Johnson levothyroxine (SYNTHROID, LEVOTHROID) 175 MCG tablet Take 175 mcg by mouth daily before breakfast.    . losartan-hydrochlorothiazide (HYZAAR) 100-25 MG tablet Take 1 tablet by mouth daily.    . metoprolol tartrate (LOPRESSOR) 50 MG tablet Take 50 mg by mouth 2 (two) times daily. Takes one half tablet bid    . omeprazole (PRILOSEC) 20 MG capsule Take 20 mg by mouth daily.    . simvastatin (ZOCOR) 20 MG tablet      No current facility-administered medications for this visit.     Pertinent items are noted in HPI.   Review of Systems:  Review of Systems  Constitutional: Negative.   HENT: Negative.   Eyes: Negative.   Respiratory: Negative.   Cardiovascular: Negative for chest pain, palpitations, orthopnea, claudication, leg swelling and PND.  Skin: Negative.       Physical Exam: BP (!) 145/88 (BP Location: Right Arm, Patient Position: Sitting, Cuff Size: Normal)   Pulse 72   Ht 5\' 10"  (1.778 m)   Wt 200 lb 6.4 oz (90.9 kg)   SpO2 98%  Comment: RA  BMI 28.75 kg/m   PHYSICAL EXAMINATION: General appearance: alert, cooperative and no distress Head: Normocephalic, without obvious abnormality, atraumatic Neck: no adenopathy, no carotid bruit, no JVD, supple, symmetrical, trachea  midline and thyroid not enlarged, symmetric, no tenderness/mass/nodules Lymph nodes: Cervical, supraclavicular, and axillary nodes normal. Resp: clear to auscultation bilaterally Cardio: regular rate and rhythm, S1, S2 normal, no murmur, click, rub or gallop GI: soft, non-tender; bowel sounds normal; no masses,  no organomegaly Extremities: extremities normal, atraumatic, no cyanosis or edema Neurologic: Grossly normal Patient has palpable DP and PT pulses bilaterally palpable brachial and radial pulses bilaterally He has a 5 cm incisional hernia from his previous abdominal aortic aneurysm repair but without evidence of incarceration  Diagnostic Studies & Laboratory data:     Recent Radiology Findings:  Ct Chest Wo Contrast  Result Date: 04/02/2018 CLINICAL DATA:  Thoracic aortic aneurysm without rupture. EXAM: CT CHEST WITHOUT CONTRAST TECHNIQUE: Multidetector CT imaging of the chest was performed following the standard protocol without IV contrast. COMPARISON:  CT scan of August 14, 2017. FINDINGS: Cardiovascular: Focal aneurysmal bulge measuring 4.6 cm is noted in the anterior portion of the transverse aortic arch. Atherosclerosis of thoracic aorta is noted. Coronary artery calcifications are noted. No pericardial effusion is noted. Normal cardiac size. Mediastinum/Nodes: No enlarged mediastinal or axillary lymph nodes. Thyroid gland, trachea, and esophagus demonstrate no significant findings. Lungs/Pleura: No pneumothorax or pleural effusion is noted. Emphysematous disease is noted in both upper lobes. No acute pulmonary disease is noted. Upper Abdomen: Moderate ventral hernia is noted in epigastric region. Musculoskeletal: No chest wall mass or suspicious bone lesions identified. IMPRESSION: Stable focal aneurysmal bulge is seen involving the anterior portion of transverse aortic arch measuring 4.6 cm. Ascending thoracic aortic aneurysm. Recommend semi-annual imaging followup by CTA or MRA and  referral to cardiothoracic surgery if not already obtained. This recommendation follows 2010 ACCF/AHA/AATS/ACR/ASA/SCA/SCAI/SIR/STS/SVM Guidelines for the Diagnosis and Management of Patients With Thoracic Aortic Disease. Circulation. 2010; 121: [id number]. Coronary artery calcifications are noted. Moderate ventral hernia is noted in epigastric region. Aortic Atherosclerosis (ICD10-I70.0) and Emphysema (ICD10-J43.9). Electronically Signed   By: Marijo Conception, M.D.   On: 04/02/2018 13:02     Ct Chest Lung Ca Screen Low Dose W/o Cm  Result Date: 08/14/2017 CLINICAL DATA:  Current asymptomatic smoker. Forty pack-year history. EXAM: CT CHEST WITHOUT CONTRAST LOW-DOSE FOR LUNG CANCER SCREENING TECHNIQUE: Multidetector CT imaging of the chest was performed following the standard protocol without IV contrast. COMPARISON:  None. FINDINGS: Cardiovascular: The heart size is normal. Aortic atherosclerosis identified. Aneurysmal dilatation of the anterior arch measures 4.7 cm, image 21 of series 2. Calcifications within the left main coronary artery, LAD, left circumflex noted. No pericardial effusion. Mediastinum/Nodes: No enlarged mediastinal, hilar, or axillary lymph nodes. Thyroid gland, trachea, and esophagus demonstrate no significant findings. Lungs/Pleura: Moderate to advanced changes of centrilobular emphysema. Scattered nodules are identified the largest is in the right middle lobe with an equivalent diameter and 6.4 mm. Upper Abdomen: No acute abnormality. Musculoskeletal: Degenerative disc disease noted within the thoracic spine. No aggressive lytic or sclerotic bone lesions. IMPRESSION: 1. Lung-RADS 2, benign appearance or behavior. Continue annual screening with low-dose chest CT without contrast in 12 months. 2. Aortic Atherosclerosis (ICD10-I70.0) and Emphysema (ICD10-J43.9). 3. Aortic aneurysm NOS (ICD10-I71.9). Aneurysmal dilatation of the anterior arch of the thoracic aorta is identified. Recommend  semi-annual imaging followup by CTA or MRA and referral to cardiothoracic surgery if not already obtained. This recommendation follows  2010 ACCF/AHA/AATS/ACR/ASA/SCA/SCAI/SIR/STS/SVM Guidelines for the Diagnosis and Management of Patients With Thoracic Aortic Disease. Circulation. 2010; 121: [phone] Electronically Signed   By: Kerby Moors M.D.   On: 08/14/2017 16:00    I have independently reviewed the above radiology studies  and reviewed the findings with the patient.     Recent Lab Findings: No results found for: WBC, HGB, HCT, PLT, GLUCOSE, CHOL, TRIG, HDL, LDLDIRECT, LDLCALC, ALT, AST, NA, K, CL, CREATININE, BUN, CO2, TSH, INR, GLUF, HGBA1C                                Assessment / Plan:   1/Stable focal aneurysmal bulge is seen involving the anterior portion of transverse aortic arch measuring 4.6 cm. Ascending thoracic aortic aneurys  2/Stage II chronic renal failure #3 calcification of the left main coronary artery LAD and circumflex history of myocardial infarction 1999 with stent placement #4 moderate to advanced changes of central lobar emphysema on chest CT   Patient's CT of the chest appears stable we will plan follow-up CT in 8 months He continues to remain smoke-free since stopping in the fall 2018    Grace Isaac MD      [address] [place] [address] Office [phone]   Beeper [phone]  04/02/2018 2:35 PM

## 2018-04-02 NOTE — Patient Instructions (Signed)

## 2018-04-05 DIAGNOSIS — L209 Atopic dermatitis, unspecified: Secondary | ICD-10-CM | POA: Diagnosis not present

## 2018-04-17 DIAGNOSIS — L291 Pruritus scroti: Secondary | ICD-10-CM | POA: Diagnosis not present

## 2018-04-17 DIAGNOSIS — B86 Scabies: Secondary | ICD-10-CM | POA: Diagnosis not present

## 2018-05-14 DIAGNOSIS — I10 Essential (primary) hypertension: Secondary | ICD-10-CM | POA: Diagnosis not present

## 2018-05-14 DIAGNOSIS — I712 Thoracic aortic aneurysm, without rupture: Secondary | ICD-10-CM | POA: Diagnosis not present

## 2018-05-14 DIAGNOSIS — Z79899 Other long term (current) drug therapy: Secondary | ICD-10-CM | POA: Diagnosis not present

## 2018-05-21 DIAGNOSIS — R21 Rash and other nonspecific skin eruption: Secondary | ICD-10-CM | POA: Diagnosis not present

## 2018-05-21 DIAGNOSIS — I10 Essential (primary) hypertension: Secondary | ICD-10-CM | POA: Diagnosis not present

## 2018-05-21 DIAGNOSIS — N184 Chronic kidney disease, stage 4 (severe): Secondary | ICD-10-CM | POA: Diagnosis not present

## 2018-07-08 ENCOUNTER — Ambulatory Visit: Payer: Medicare Other

## 2018-07-11 DIAGNOSIS — Z85828 Personal history of other malignant neoplasm of skin: Secondary | ICD-10-CM | POA: Diagnosis not present

## 2018-07-11 DIAGNOSIS — L309 Dermatitis, unspecified: Secondary | ICD-10-CM | POA: Diagnosis not present

## 2018-07-11 DIAGNOSIS — L570 Actinic keratosis: Secondary | ICD-10-CM | POA: Diagnosis not present

## 2018-07-11 DIAGNOSIS — L111 Transient acantholytic dermatosis [Grover]: Secondary | ICD-10-CM | POA: Diagnosis not present

## 2018-07-11 DIAGNOSIS — L821 Other seborrheic keratosis: Secondary | ICD-10-CM | POA: Diagnosis not present

## 2018-07-18 ENCOUNTER — Ambulatory Visit (INDEPENDENT_AMBULATORY_CARE_PROVIDER_SITE_OTHER): Payer: Self-pay

## 2018-07-18 DIAGNOSIS — N184 Chronic kidney disease, stage 4 (severe): Secondary | ICD-10-CM | POA: Diagnosis not present

## 2018-07-18 DIAGNOSIS — M109 Gout, unspecified: Secondary | ICD-10-CM | POA: Diagnosis not present

## 2018-07-18 DIAGNOSIS — I10 Essential (primary) hypertension: Secondary | ICD-10-CM | POA: Diagnosis not present

## 2018-07-18 DIAGNOSIS — Z79899 Other long term (current) drug therapy: Secondary | ICD-10-CM | POA: Diagnosis not present

## 2018-07-18 DIAGNOSIS — Z1211 Encounter for screening for malignant neoplasm of colon: Secondary | ICD-10-CM

## 2018-07-18 DIAGNOSIS — I2510 Atherosclerotic heart disease of native coronary artery without angina pectoris: Secondary | ICD-10-CM | POA: Diagnosis not present

## 2018-07-18 MED ORDER — NA SULFATE-K SULFATE-MG SULF 17.5-3.13-1.6 GM/177ML PO SOLN: 1.0000 | ORAL | 0 refills | Status: DC

## 2018-07-18 NOTE — Patient Instructions (Signed)
LADERRICK WILK  1946-08-01 MRN: [id number]     Procedure Date: 09/30/18 Time to register: 8:00am Place to register: [facility] Procedure Time: 9:00am Scheduled provider: Barney Drain, MD    PREPARATION FOR COLONOSCOPY WITH SUPREP BOWEL PREP KIT  Note: Suprep Bowel Prep Kit is a split-dose (2day) regimen. Consumption of BOTH 6-ounce bottles is required for a complete prep.  Please notify us immediately if you are diabetic, take iron supplements, or if you are on Coumadin or any other blood thinners.                                                                                                                                   1 DAY BEFORE PROCEDURE:  DATE: 09/29/18   DAY: Sunday  clear liquids the entire day - NO SOLID FOOD.   At 6:00pm: Complete steps 1 through 4 below, using ONE (1) 6-ounce bottle, before going to bed. Step 1:  Pour ONE (1) 6-ounce bottle of SUPREP liquid into the mixing container.  Step 2:  Add cool drinking water to the 16 ounce line on the container and mix.  Note: Dilute the solution concentrate as directed prior to use. Step 3:  DRINK ALL the liquid in the container. Step 4:  You MUST drink an additional two (2) or more 16 ounce containers of water over the next one (1) hour.   Continue clear liquids.  DAY OF PROCEDURE:   DATE: 09/30/18   DAY: Monday If you take medications for your heart, blood pressure, or breathing, you may take these medications.    5 hours before your procedure at :4:00am Step 1:  Pour ONE (1) 6-ounce bottle of SUPREP liquid into the mixing container.  Step 2:  Add cool drinking water to the 16 ounce line on the container and mix.  Note: Dilute the solution concentrate as directed prior to use. Step 3:  DRINK ALL the liquid in the container. Step 4:  You MUST drink an additional two (2) or more 16 ounce containers of water over the next one (1) hour. You MUST complete the final glass of water at least 3 hours before your  colonoscopy.   Nothing by mouth past: 6:00am  You may take your morning medications with sip of water unless we have instructed otherwise.    Please see below for Dietary Information.  CLEAR LIQUIDS INCLUDE:  Water Jello (NOT red in color)   Ice Popsicles (NOT red in color)   Tea (sugar ok, no milk/cream) Powdered fruit flavored drinks  Coffee (sugar ok, no milk/cream) Gatorade/ Lemonade/ Kool-Aid  (NOT red in color)   Juice: apple, white grape, white cranberry Soft drinks  Clear bullion, consomme, broth (fat free beef/chicken/vegetable)  Carbonated beverages (any kind)  Strained chicken noodle soup Hard Candy   Remember: Clear liquids are liquids that will allow you to see your fingers on the other side of a clear  glass. Be sure liquids are NOT red in color, and not cloudy, but CLEAR.  DO NOT EAT OR DRINK ANY OF THE FOLLOWING:  Dairy products of any kind   Cranberry juice Tomato juice / V8 juice   Grapefruit juice Orange juice     Red grape juice  Do not eat any solid foods, including such foods as: cereal, oatmeal, yogurt, fruits, vegetables, creamed soups, eggs, bread, crackers, pureed foods in a blender, etc.   HELPFUL HINTS FOR DRINKING PREP SOLUTION:   Make sure prep is extremely cold. Mix and refrigerate the the morning of the prep. You may also put in the freezer.   You may try mixing some Crystal Light or Country Time Lemonade if you prefer. Mix in small amounts; add more if necessary.  Try drinking through a straw  Rinse mouth with water or a mouthwash between glasses, to remove after-taste.  Try sipping on a cold beverage /ice/ popsicles between glasses of prep.  Place a piece of sugar-free hard candy in mouth between glasses.  If you become nauseated, try consuming smaller amounts, or stretch out the time between glasses. Stop for 30-60 minutes, then slowly start back drinking.     OTHER INSTRUCTIONS  You will need a responsible adult at least 72 years of  age to accompany you and drive you home. This person must remain in the waiting room during your procedure. The hospital will cancel your procedure if you do not have a responsible adult with you.   1. Wear loose fitting clothing that is easily removed. 2. Leave jewelry and other valuables at home.  3. Remove all body piercing jewelry and leave at home. 4. Total time from sign-in until discharge is approximately 2-3 hours. 5. You should go home directly after your procedure and rest. You can resume normal activities the day after your procedure. 6. The day of your procedure you should not:  Drive  Make legal decisions  Operate machinery  Drink alcohol  Return to work   You may call the office (Dept: [phone]) before 5:00pm, or page the doctor on call [phone]) after 5:00pm, for further instructions, if necessary.   Insurance Information YOU WILL NEED TO CHECK WITH YOUR INSURANCE COMPANY FOR THE BENEFITS OF COVERAGE YOU HAVE FOR THIS PROCEDURE.  UNFORTUNATELY, NOT ALL INSURANCE COMPANIES HAVE BENEFITS TO COVER ALL OR PART OF THESE TYPES OF PROCEDURES.  IT IS YOUR RESPONSIBILITY TO CHECK YOUR BENEFITS, HOWEVER, WE WILL BE GLAD TO ASSIST YOU WITH ANY CODES YOUR INSURANCE COMPANY MAY NEED.    PLEASE NOTE THAT MOST INSURANCE COMPANIES WILL NOT COVER A SCREENING COLONOSCOPY FOR PEOPLE UNDER THE AGE OF 50  IF YOU HAVE BCBS INSURANCE, YOU MAY HAVE BENEFITS FOR A SCREENING COLONOSCOPY BUT IF POLYPS ARE FOUND THE DIAGNOSIS WILL CHANGE AND THEN YOU MAY HAVE A DEDUCTIBLE THAT WILL NEED TO BE MET. SO PLEASE MAKE SURE YOU CHECK YOUR BENEFITS FOR A SCREENING COLONOSCOPY AS WELL AS A DIAGNOSTIC COLONOSCOPY.

## 2018-07-18 NOTE — Progress Notes (Signed)
Gastroenterology Pre-Procedure Review  Request Date:07/18/18 Requesting Physician: Dr.Fagan last tcs- 06/04/07- [facility]- hyperplastic polyp  PATIENT REVIEW QUESTIONS: The patient responded to the following health history questions as indicated:    (has aortic aneurysm- found in 08/2017, pt stated he was scheduled for a tcs last year but his pcp found an aortic aneurysm, so pt cancelled until he knew what was going on with it. He stated he has had 2 ultrasounds and it is stable at this time.    1. Diabetes Melitis: no 2. Joint replacements in the past 12 months: no 3. Major health problems in the past 3 months: no 4. Has an artificial valve or MVP: no 5. Has a defibrillator: no 6. Has been advised in past to take antibiotics in advance of a procedure like teeth cleaning: no 7. Family history of colon cancer: no  8. Alcohol Use: no 9. History of sleep apnea: no  10. History of coronary artery or other vascular stents placed within the last 12 months: no 11. History of any prior anesthesia complications: no    MEDICATIONS & ALLERGIES:    Patient reports the following regarding taking any blood thinners:   Plavix? no Aspirin? yes (81mg ) Coumadin? no Brilinta? no Xarelto? no Eliquis? no Pradaxa? no Savaysa? no Effient? no  Patient confirms/reports the following medications:  Current Outpatient Medications  Medication Sig Dispense Refill  . allopurinol (ZYLOPRIM) 300 MG tablet Take 300 mg by mouth daily.    [name] amLODipine (NORVASC) 5 MG tablet     . aspirin EC 81 MG tablet Take 81 mg by mouth daily.    [name] levothyroxine (SYNTHROID, LEVOTHROID) 175 MCG tablet Take 175 mcg by mouth daily before breakfast.    . losartan-hydrochlorothiazide (HYZAAR) 100-25 MG tablet Take 1 tablet by mouth daily.    . metoprolol tartrate (LOPRESSOR) 50 MG tablet Take 50 mg by mouth 2 (two) times daily. Takes one half tablet bid    . mometasone (ELOCON) 0.1 % ointment 2 (two) times daily.  2  . omeprazole  (PRILOSEC) 20 MG capsule Take 20 mg by mouth daily.    . simvastatin (ZOCOR) 20 MG tablet      No current facility-administered medications for this visit.     Patient confirms/reports the following allergies:  Allergies  Allergen Reactions  . Penicillins     No orders of the defined types were placed in this encounter.   AUTHORIZATION INFORMATION Primary Insurance: [place],  ID #: [id number] Pre-Cert / Josem Kaufmann required: no   SCHEDULE INFORMATION: Procedure has been scheduled as follows:  Date: 09/30/18, Time: 9:00  Location: [facility]  This Gastroenterology Pre-Precedure Review Form is being routed to the following provider(s): Neil Crouch, PA

## 2018-07-25 DIAGNOSIS — Z0001 Encounter for general adult medical examination with abnormal findings: Secondary | ICD-10-CM | POA: Diagnosis not present

## 2018-07-25 DIAGNOSIS — N184 Chronic kidney disease, stage 4 (severe): Secondary | ICD-10-CM | POA: Diagnosis not present

## 2018-07-25 DIAGNOSIS — I129 Hypertensive chronic kidney disease with stage 1 through stage 4 chronic kidney disease, or unspecified chronic kidney disease: Secondary | ICD-10-CM | POA: Diagnosis not present

## 2018-07-25 DIAGNOSIS — I712 Thoracic aortic aneurysm, without rupture: Secondary | ICD-10-CM | POA: Diagnosis not present

## 2018-07-25 DIAGNOSIS — R202 Paresthesia of skin: Secondary | ICD-10-CM | POA: Diagnosis not present

## 2018-07-28 NOTE — Progress Notes (Signed)
Reviewed epic. Patient has thoracic aortic aneurysm. Medium sized ventral hernia.   Ok to schedule.

## 2018-08-08 DIAGNOSIS — L570 Actinic keratosis: Secondary | ICD-10-CM | POA: Diagnosis not present

## 2018-08-08 DIAGNOSIS — R21 Rash and other nonspecific skin eruption: Secondary | ICD-10-CM | POA: Diagnosis not present

## 2018-08-19 DIAGNOSIS — Z23 Encounter for immunization: Secondary | ICD-10-CM | POA: Diagnosis not present

## 2018-08-21 ENCOUNTER — Other Ambulatory Visit: Payer: Self-pay

## 2018-08-21 NOTE — Patient Outreach (Signed)
[facility]) Care Management  08/21/2018  Jeremy Johnson 1946-09-02 [id number]   Medication Adherence call to Mr. Jeremy Johnson patient is due on Losartan / HCTZ 100/25 mg spoke with patient is expecting and order from [facility]  soon but , said he still has medication. Jeremy Johnson is showing past due under [place].   [facility] Management Direct Dial [phone]  Fax [phone] [email or web address]

## 2018-08-21 NOTE — Patient Outreach (Signed)
[facility]) Care Management  08/21/2018  Jeremy Johnson [birth year] [id number]   Medication Adherence call to Mr. [name] left a message for patient to call back patient is due on Lisinopril 20 mg under [place].   [facility] Management Direct Dial [phone]  Fax [phone] [email or web address]

## 2018-09-09 DIAGNOSIS — J069 Acute upper respiratory infection, unspecified: Secondary | ICD-10-CM | POA: Diagnosis not present

## 2018-09-09 DIAGNOSIS — J209 Acute bronchitis, unspecified: Secondary | ICD-10-CM | POA: Diagnosis not present

## 2018-09-13 DIAGNOSIS — J209 Acute bronchitis, unspecified: Secondary | ICD-10-CM | POA: Diagnosis not present

## 2018-09-30 ENCOUNTER — Ambulatory Visit (HOSPITAL_COMMUNITY)
Admission: RE | Admit: 2018-09-30 | Discharge: 2018-09-30 | Disposition: A | Discharge status: Home / Self Care | Payer: Medicare Other | Source: Ambulatory Visit | Attending: Gastroenterology | Admitting: Gastroenterology

## 2018-09-30 ENCOUNTER — Encounter (HOSPITAL_COMMUNITY): Payer: Self-pay

## 2018-09-30 ENCOUNTER — Encounter (HOSPITAL_COMMUNITY)
Admission: RE | Disposition: A | Discharge status: Home / Self Care | Payer: Self-pay | Source: Ambulatory Visit | Attending: Gastroenterology

## 2018-09-30 DIAGNOSIS — Z1211 Encounter for screening for malignant neoplasm of colon: Secondary | ICD-10-CM

## 2018-09-30 DIAGNOSIS — D123 Benign neoplasm of transverse colon: Secondary | ICD-10-CM | POA: Insufficient documentation

## 2018-09-30 DIAGNOSIS — D125 Benign neoplasm of sigmoid colon: Secondary | ICD-10-CM | POA: Insufficient documentation

## 2018-09-30 DIAGNOSIS — N183 Chronic kidney disease, stage 3 (moderate): Secondary | ICD-10-CM | POA: Insufficient documentation

## 2018-09-30 DIAGNOSIS — E039 Hypothyroidism, unspecified: Secondary | ICD-10-CM | POA: Insufficient documentation

## 2018-09-30 DIAGNOSIS — K219 Gastro-esophageal reflux disease without esophagitis: Secondary | ICD-10-CM | POA: Insufficient documentation

## 2018-09-30 DIAGNOSIS — Z955 Presence of coronary angioplasty implant and graft: Secondary | ICD-10-CM | POA: Diagnosis not present

## 2018-09-30 DIAGNOSIS — M47816 Spondylosis without myelopathy or radiculopathy, lumbar region: Secondary | ICD-10-CM | POA: Diagnosis not present

## 2018-09-30 DIAGNOSIS — I2510 Atherosclerotic heart disease of native coronary artery without angina pectoris: Secondary | ICD-10-CM | POA: Insufficient documentation

## 2018-09-30 DIAGNOSIS — E785 Hyperlipidemia, unspecified: Secondary | ICD-10-CM | POA: Insufficient documentation

## 2018-09-30 DIAGNOSIS — D122 Benign neoplasm of ascending colon: Secondary | ICD-10-CM | POA: Diagnosis not present

## 2018-09-30 DIAGNOSIS — M109 Gout, unspecified: Secondary | ICD-10-CM | POA: Diagnosis not present

## 2018-09-30 DIAGNOSIS — Z880 Allergy status to penicillin: Secondary | ICD-10-CM | POA: Insufficient documentation

## 2018-09-30 DIAGNOSIS — Z87891 Personal history of nicotine dependence: Secondary | ICD-10-CM | POA: Diagnosis not present

## 2018-09-30 DIAGNOSIS — I129 Hypertensive chronic kidney disease with stage 1 through stage 4 chronic kidney disease, or unspecified chronic kidney disease: Secondary | ICD-10-CM | POA: Diagnosis not present

## 2018-09-30 DIAGNOSIS — K648 Other hemorrhoids: Secondary | ICD-10-CM | POA: Insufficient documentation

## 2018-09-30 DIAGNOSIS — Z7982 Long term (current) use of aspirin: Secondary | ICD-10-CM | POA: Insufficient documentation

## 2018-09-30 HISTORY — PX: COLONOSCOPY: SHX5424

## 2018-09-30 HISTORY — PX: POLYPECTOMY: SHX5525

## 2018-09-30 HISTORY — PX: BIOPSY: SHX5522

## 2018-09-30 SURGERY — COLONOSCOPY
Anesthesia: Moderate Sedation

## 2018-09-30 MED ORDER — SPOT INK MARKER SYRINGE KIT
PACK | SUBMUCOSAL | Status: AC
  Filled 2018-09-30: qty 5

## 2018-09-30 MED ORDER — SPOT INK MARKER SYRINGE KIT
PACK | SUBMUCOSAL | Status: DC | PRN
  Administered 2018-09-30: 1 mL via SUBMUCOSAL

## 2018-09-30 MED ORDER — MIDAZOLAM HCL 5 MG/5ML IJ SOLN
INTRAMUSCULAR | Status: DC | PRN
  Administered 2018-09-30 (×2): 1 mg via INTRAVENOUS
  Administered 2018-09-30: 2 mg via INTRAVENOUS
  Administered 2018-09-30: 1 mg via INTRAVENOUS

## 2018-09-30 MED ORDER — MIDAZOLAM HCL 5 MG/5ML IJ SOLN
INTRAMUSCULAR | Status: AC
  Filled 2018-09-30: qty 10

## 2018-09-30 MED ORDER — MEPERIDINE HCL 100 MG/ML IJ SOLN
INTRAMUSCULAR | Status: DC | PRN
  Administered 2018-09-30 (×3): 25 mg

## 2018-09-30 MED ORDER — SODIUM CHLORIDE 0.9 % IV SOLN
INTRAVENOUS | Status: DC
  Administered 2018-09-30: 08:00:00 via INTRAVENOUS

## 2018-09-30 MED ORDER — MEPERIDINE HCL 100 MG/ML IJ SOLN
INTRAMUSCULAR | Status: AC
  Filled 2018-09-30: qty 2

## 2018-09-30 MED ORDER — STERILE WATER FOR IRRIGATION IR SOLN
Status: DC | PRN
  Administered 2018-09-30: 1.5 mL

## 2018-09-30 NOTE — Op Note (Signed)
[facility] Patient Name: Jeremy Johnson Procedure Date: 09/30/2018 9:03 AM MRN: [id number] Date of Birth: 26-Jul-1946 Attending MD: Barney Drain MD, MD CSN: [id number] Age: 72 Admit Type: Outpatient Procedure:                Colonoscopy WITH COLD FORCEPS BIOPSY/SNARE                            POLYPECTOMY/SPOT TATTOO Indications:              Screening for colorectal malignant neoplasm Providers:                Barney Drain MD, MD, Gerome Sam, RN, Aram Candela Referring MD:             [name] Medicines:                Meperidine 75 mg IV, Midazolam 5 mg IV Complications:             Estimated Blood Loss:     Estimated blood loss was minimal. Procedure:                Pre-Anesthesia Assessment:                           - Prior to the procedure, a History and Physical                            was performed, and patient medications and                            allergies were reviewed. The patient's tolerance of                            previous anesthesia was also reviewed. The risks                            and benefits of the procedure and the sedation                            options and risks were discussed with the patient.                            All questions were answered, and informed consent                            was obtained. Prior Anticoagulants: The patient has                            taken no previous anticoagulant or antiplatelet                            agents. ASA Grade Assessment: II - A patient with  mild systemic disease. After reviewing the risks                            and benefits, the patient was deemed in                            satisfactory condition to undergo the procedure.                            After obtaining informed consent, the colonoscope                            was passed under direct vision. Throughout the                            procedure, the  patient's blood pressure, pulse, and                            oxygen saturations were monitored continuously. The                            CF-[id number] ([id number]) scope was introduced through                            the anus and advanced to the the cecum, identified                            by appendiceal orifice and ileocecal valve. The                            colonoscopy was somewhat difficult due to a                            tortuous colon. Successful completion of the                            procedure was aided by straightening and shortening                            the scope to obtain bowel loop reduction and                            COLOWRAP. The patient tolerated the procedure well.                            The quality of the bowel preparation was good. The                            ileocecal valve, appendiceal orifice, and rectum                            were photographed. Scope In: 9:36:57 AM Scope Out: 10:10:28 AM Scope Withdrawal Time: 0 hours 29 minutes 42 seconds  Total Procedure  Duration: 0 hours 33 minutes 31 seconds  Findings:      External and internal hemorrhoids were found. The hemorrhoids were small.      The recto-sigmoid colon, sigmoid colon and descending colon were       moderately tortuous.      Six sessile polyps were found in the sigmoid colon(1: BTL #3),       transverse colon, hepatic flexure(2) and ascending colon(2). The polyps       were 3 to 6 mm in size. These polyps were removed with a cold snare.       Resection and retrieval were complete.      A medium-sized polypoid lesion was found in the proximal ascending       colon. The lesion was flat. No bleeding was present. This was biopsied       with a cold forceps for histology. THE FOLD JUST DISTAL TO THE Area was       tattooed with an injection of 1 mL of Spot (carbon black).(BTL #2) Impression:               - External and internal hemorrhoids.                           - SIX  POLYPS REMOVED                           - Tortuous LEFT colon.                           - ONE ASCENDING COLONPOLYPOID LESION BIOPSIED Moderate Sedation:      Moderate (conscious) sedation was administered by the endoscopy nurse       and supervised by the endoscopist. The following parameters were       monitored: oxygen saturation, heart rate, blood pressure, and response       to care. Total physician intraservice time was 48 minutes. Recommendation:           - Patient has a contact number available for                            emergencies. The signs and symptoms of potential                            delayed complications were discussed with the                            patient. Return to normal activities tomorrow.                            Written discharge instructions were provided to the                            patient.                           - High fiber diet.                           - Continue present medications.                           -  Await pathology results.                           - Repeat colonoscopy in 3 years for surveillance.                            IF SIMPLE ADENOMA IN BTL #2 PT WILL NEED TCS/EMR                            WITHIN 6 MOS. Procedure Code(s):        --- Professional ---                           [phone], Colonoscopy, flexible; with removal of                            tumor(s), polyp(s), or other lesion(s) by snare                            technique                           45381, Colonoscopy, flexible; with directed                            submucosal injection(s), any substance                           67124, 59, Colonoscopy, flexible; with biopsy,                            single or multiple                           99153, Moderate sedation; each additional 15                            minutes intraservice time                           99153, Moderate sedation; each additional 15                            minutes  intraservice time                           G0500, Moderate sedation services provided by the                            same physician or other qualified health care                            professional performing a gastrointestinal                            endoscopic service that sedation supports,  requiring the presence of an independent trained                            observer to assist in the monitoring of the                            patient's level of consciousness and physiological                            status; initial 15 minutes of intra-service time;                            patient age 17 years or older (additional time may                            be reported with [id number], as appropriate) Diagnosis Code(s):        --- Professional ---                           Z12.11, Encounter for screening for malignant                            neoplasm of colon                           K64.8, Other hemorrhoids                           Q43.8, Other specified congenital malformations of                            intestine CPT copyright 2018 [employer]. All rights reserved. The codes documented in this report are preliminary and upon coder review may  be revised to meet current compliance requirements. Barney Drain, MD Barney Drain MD, MD 09/30/2018 10:21:45 AM This report has been signed electronically. Number of Addenda: 0

## 2018-09-30 NOTE — Discharge Instructions (Signed)
You had 6 small polyps removed. You have SMALL internal hemorrhoids.   DRINK WATER TO KEEP YOUR URINE LIGHT YELLOW.  FOLLOW A HIGH FIBER DIET. AVOID ITEMS THAT CAUSE BLOATING & GAS. SEE INFO BELOW.  CONTINUE YOUR WEIGHT LOSS EFFORTS. YOUR BODY MASS INDEX IS OVER 30 WHICH MEANS YOU ARE OBESE. OBESITY IS ASSOCIATED WITH AN INCREASED FOR CIRRHOSIS AND ALL CANCERS, INCLUDING ESOPHAGEAL AND COLON CANCER. A WEIGHT OF 200 LBS OR LESS  WILL GET YOUR BODY MASS INDEX(BMI) UNDER 30.   YOUR BIOPSY RESULTS WILL BE BACK IN 5 BUSINESS DAYS.   Next colonoscopy in 3 years.    Colonoscopy Care After Read the instructions outlined below and refer to this sheet in the next week. These discharge instructions provide you with general information on caring for yourself after you leave the hospital. While your treatment has been planned according to the most current medical practices available, unavoidable complications occasionally occur. If you have any problems or questions after discharge, call DR. Tesa Meadors, [phone].  ACTIVITY  You may resume your regular activity, but move at a slower pace for the next 24 hours.   Take frequent rest periods for the next 24 hours.   Walking will help get rid of the air and reduce the bloated feeling in your belly (abdomen).   No driving for 24 hours (because of the medicine (anesthesia) used during the test).   You may shower.   Do not sign any important legal documents or operate any machinery for 24 hours (because of the anesthesia used during the test).    NUTRITION  Drink plenty of fluids.   You may resume your normal diet as instructed by your doctor.   Begin with a light meal and progress to your normal diet. Heavy or fried foods are harder to digest and may make you feel sick to your stomach (nauseated).   Avoid alcoholic beverages for 24 hours or as instructed.    MEDICATIONS  You may resume your normal medications.   WHAT YOU CAN EXPECT  TODAY  Some feelings of bloating in the abdomen.   Passage of more gas than usual.   Spotting of blood in your stool or on the toilet paper  .  IF YOU HAD POLYPS REMOVED DURING THE COLONOSCOPY:  Eat a soft diet IF YOU HAVE NAUSEA, BLOATING, ABDOMINAL PAIN, OR VOMITING.    FINDING OUT THE RESULTS OF YOUR TEST Not all test results are available during your visit. DR. Oneida Alar WILL CALL YOU WITHIN 14 DAYS OF YOUR PROCEDUE WITH YOUR RESULTS. Do not assume everything is normal if you have not heard from DR. Yashira Offenberger, CALL HER OFFICE AT [phone].  SEEK IMMEDIATE MEDICAL ATTENTION AND CALL THE OFFICE: [phone] IF:  You have more than a spotting of blood in your stool.   Your belly is swollen (abdominal distention).   You are nauseated or vomiting.   You have a temperature over 101F.   You have abdominal pain or discomfort that is severe or gets worse throughout the day.   High-Fiber Diet A high-fiber diet changes your normal diet to include more whole grains, legumes, fruits, and vegetables. Changes in the diet involve replacing refined carbohydrates with unrefined foods. The calorie level of the diet is essentially unchanged. The Dietary Reference Intake (recommended amount) for adult males is 38 grams per day. For adult females, it is 25 grams per day. Pregnant and lactating women should consume 28 grams of fiber per day. Fiber is the intact  part of a plant that is not broken down during digestion. Functional fiber is fiber that has been isolated from the plant to provide a beneficial effect in the body. PURPOSE  Increase stool bulk.   Ease and regulate bowel movements.   Lower cholesterol.   REDUCE RISK OF COLON CANCER  INDICATIONS THAT YOU NEED MORE FIBER  Constipation and hemorrhoids.   Uncomplicated diverticulosis (intestine condition) and irritable bowel syndrome.   Weight management.   As a protective measure against hardening of the arteries (atherosclerosis),  diabetes, and cancer.   GUIDELINES FOR INCREASING FIBER IN THE DIET  Start adding fiber to the diet slowly. A gradual increase of about 5 more grams (2 slices of whole-wheat bread, 2 servings of most fruits or vegetables, or 1 bowl of high-fiber cereal) per day is best. Too rapid an increase in fiber may result in constipation, flatulence, and bloating.   Drink enough water and fluids to keep your urine clear or pale yellow. Water, juice, or caffeine-free drinks are recommended. Not drinking enough fluid may cause constipation.   Eat a variety of high-fiber foods rather than one type of fiber.   Try to increase your intake of fiber through using high-fiber foods rather than fiber pills or supplements that contain small amounts of fiber.   The goal is to change the types of food eaten. Do not supplement your present diet with high-fiber foods, but replace foods in your present diet.   INCLUDE A VARIETY OF FIBER SOURCES  Replace refined and processed grains with whole grains, canned fruits with fresh fruits, and incorporate other fiber sources. White rice, white breads, and most bakery goods contain little or no fiber.   Brown whole-grain rice, buckwheat oats, and many fruits and vegetables are all good sources of fiber. These include: broccoli, Brussels sprouts, cabbage, cauliflower, beets, sweet potatoes, white potatoes (skin on), carrots, tomatoes, eggplant, squash, berries, fresh fruits, and dried fruits.   Cereals appear to be the richest source of fiber. Cereal fiber is found in whole grains and bran. Bran is the fiber-rich outer coat of cereal grain, which is largely removed in refining. In whole-grain cereals, the bran remains. In breakfast cereals, the largest amount of fiber is found in those with "bran" in their names. The fiber content is sometimes indicated on the label.   You may need to include additional fruits and vegetables each day.   In baking, for 1 cup white flour, you may  use the following substitutions:   1 cup whole-wheat flour minus 2 tablespoons.   1/2 cup white flour plus 1/2 cup whole-wheat flour.   Polyps, Colon  A polyp is extra tissue that grows inside your body. Colon polyps grow in the large intestine. The large intestine, also called the colon, is part of your digestive system. It is a long, hollow tube at the end of your digestive tract where your body makes and stores stool. Most polyps are not dangerous. They are benign. This means they are not cancerous. But over time, some types of polyps can turn into cancer. Polyps that are smaller than a pea are usually not harmful. But larger polyps could someday become or may already be cancerous. To be safe, doctors remove all polyps and test them.   WHO GETS POLYPS? Anyone can get polyps, but certain people are more likely than others. You may have a greater chance of getting polyps if:  You are over 50.   You have had polyps before.  Someone in your family has had polyps.   Someone in your family has had cancer of the large intestine.   Find out if someone in your family has had polyps. You may also be more likely to get polyps if you:   Eat a lot of fatty foods   Smoke   Drink alcohol   Do not exercise  Eat too much   PREVENTION There is not one sure way to prevent polyps. You might be able to lower your risk of getting them if you:  Eat more fruits and vegetables and less fatty food.   Do not smoke.   Avoid alcohol.   Exercise every day.   Lose weight if you are overweight.   Eating more calcium and folate can also lower your risk of getting polyps. Some foods that are rich in calcium are milk, cheese, and broccoli. Some foods that are rich in folate are chickpeas, kidney beans, and spinach.

## 2018-09-30 NOTE — H&P (Signed)
Primary Care Physician:  [name], MD Primary Gastroenterologist:  Dr. Oneida Alar  Pre-Procedure History & Physical: HPI:  Jeremy Johnson is a 72 y.o. male here for COLON CANCER SCREENING.  Past Medical History:  Diagnosis Date  . CAD (coronary artery disease)    STENT... MID CIRCUMFLEX...1997  . Chronic kidney disease    STAGE 3  . Degenerative joint disease (DJD) of lumbar spine   . GERD (gastroesophageal reflux disease)   . Gout   . Hyperlipidemia   . Hypertension   . Hypothyroidism   . Leukocytosis    CHRONIC MILD    Past Surgical History:  Procedure Laterality Date  . ABDOMINAL AORTIC ANEURYSM REPAIR  2006  . COLONOSCOPY  2008  . CORONARY ANGIOPLASTY WITH STENT PLACEMENT  2008   [facility]    Prior to Admission medications   Medication Sig Start Date End Date Taking? Authorizing Provider  allopurinol (ZYLOPRIM) 300 MG tablet Take 300 mg by mouth daily.   Yes [provider]  amLODipine (NORVASC) 5 MG tablet Take 5 mg by mouth daily.  03/12/18  Yes [provider]  aspirin EC 81 MG tablet Take 81 mg by mouth daily.   Yes [provider]  levothyroxine (SYNTHROID, LEVOTHROID) 175 MCG tablet Take 175 mcg by mouth daily before breakfast.   Yes [provider]  losartan-hydrochlorothiazide (HYZAAR) 100-25 MG tablet Take 1 tablet by mouth daily.   Yes [provider]  metoprolol tartrate (LOPRESSOR) 50 MG tablet Take 25 mg by mouth 2 (two) times daily.    Yes [provider]  mometasone (ELOCON) 0.1 % ointment Apply 1 application topically 2 (two) times daily as needed (for eczema).  07/11/18  Yes [provider]  Na Sulfate-K Sulfate-Mg Sulf (SUPREP BOWEL PREP KIT) 17.5-3.13-1.6 GM/177ML SOLN Take 1 kit by mouth as directed. 07/18/18  Yes Mahala Menghini, PA-C  omeprazole (PRILOSEC) 20 MG capsule Take 20 mg by mouth daily.   Yes [provider]  simvastatin (ZOCOR) 20 MG tablet Take 20 mg by mouth daily.   03/12/18  Yes [provider]    Allergies as of 07/18/2018 - Review Complete 07/18/2018  Allergen Reaction Noted  . Penicillins  02/09/2010    Family History  Problem Relation Age of Onset  . Stroke Brother     Social History   Socioeconomic History  . Marital status: Married    Spouse name: Not on file  . Number of children: Not on file  . Years of education: Not on file  . Highest education level: Not on file  Occupational History  . Not on file  Social Needs  . Financial resource strain: Not on file  . Food insecurity:    Worry: Not on file    Inability: Not on file  . Transportation needs:    Medical: Not on file    Non-medical: Not on file  Tobacco Use  . Smoking status: Former Smoker    Packs/day: 0.50    Years: 54.00    Pack years: 27.00    Last attempt to quit: 09/17/2017    Years since quitting: 1.0  . Smokeless tobacco: Never Used  Substance and Sexual Activity  . Alcohol use: No  . Drug use: No  . Sexual activity: Not on file  Lifestyle  . Physical activity:    Days per week: Not on file    Minutes per session: Not on file  . Stress: Not on file  Relationships  .  Social connections:    Talks on phone: Not on file    Gets together: Not on file    Attends religious service: Not on file    Active member of club or organization: Not on file    Attends meetings of clubs or organizations: Not on file    Relationship status: Not on file  . Intimate partner violence:    Fear of current or ex partner: Not on file    Emotionally abused: Not on file    Physically abused: Not on file    Forced sexual activity: Not on file  Other Topics Concern  . Not on file  Social History Narrative  . Not on file    Review of Systems: See HPI, otherwise negative ROS   Physical Exam: BP 124/75   Pulse 76   Temp 97.8 F (36.6 C) (Oral)   Resp 17   Ht '5\' 9"'  (1.753 m)   Wt 92.5 kg   SpO2 98%   BMI 30.13 kg/m  General:   Alert,  pleasant and  cooperative in NAD Head:  Normocephalic and atraumatic. Neck:  Supple; Lungs:  Clear throughout to auscultation.    Heart:  Regular rate and rhythm. Abdomen:  Soft, nontender and nondistended. Normal bowel sounds, without guarding, and without rebound. VENTRAL HERNIA REDUCIBLE, NONTENDER.  Neurologic:  Alert and  oriented x4;  grossly normal neurologically.  Impression/Plan:    SCREENING  Plan:  1. TCS TODAY DISCUSSED PROCEDURE, BENEFITS, & RISKS: < 1% chance of medication reaction, bleeding, perforation, or rupture of spleen/liver.

## 2018-10-03 ENCOUNTER — Encounter (HOSPITAL_COMMUNITY): Payer: Self-pay | Admitting: Gastroenterology

## 2018-10-03 NOTE — Progress Notes (Signed)
PT is aware.

## 2018-10-21 ENCOUNTER — Other Ambulatory Visit: Payer: Self-pay | Admitting: *Deleted

## 2018-10-21 DIAGNOSIS — I712 Thoracic aortic aneurysm, without rupture, unspecified: Secondary | ICD-10-CM

## 2018-11-18 DIAGNOSIS — I2510 Atherosclerotic heart disease of native coronary artery without angina pectoris: Secondary | ICD-10-CM | POA: Diagnosis not present

## 2018-11-18 DIAGNOSIS — E039 Hypothyroidism, unspecified: Secondary | ICD-10-CM | POA: Diagnosis not present

## 2018-11-18 DIAGNOSIS — N184 Chronic kidney disease, stage 4 (severe): Secondary | ICD-10-CM | POA: Diagnosis not present

## 2018-11-18 DIAGNOSIS — E785 Hyperlipidemia, unspecified: Secondary | ICD-10-CM | POA: Diagnosis not present

## 2018-11-18 DIAGNOSIS — I10 Essential (primary) hypertension: Secondary | ICD-10-CM | POA: Diagnosis not present

## 2018-11-18 DIAGNOSIS — R7303 Prediabetes: Secondary | ICD-10-CM | POA: Diagnosis not present

## 2018-11-25 DIAGNOSIS — E211 Secondary hyperparathyroidism, not elsewhere classified: Secondary | ICD-10-CM | POA: Diagnosis not present

## 2018-11-25 DIAGNOSIS — N184 Chronic kidney disease, stage 4 (severe): Secondary | ICD-10-CM | POA: Diagnosis not present

## 2018-11-25 DIAGNOSIS — M1000 Idiopathic gout, unspecified site: Secondary | ICD-10-CM | POA: Diagnosis not present

## 2018-12-05 ENCOUNTER — Ambulatory Visit: Payer: Medicare Other | Admitting: Cardiothoracic Surgery

## 2018-12-05 ENCOUNTER — Other Ambulatory Visit: Payer: Medicare Other

## 2019-01-09 ENCOUNTER — Ambulatory Visit
Admission: RE | Admit: 2019-01-09 | Discharge: 2019-01-09 | Disposition: A | Discharge status: Home / Self Care | Payer: Medicare Other | Source: Ambulatory Visit | Attending: Cardiothoracic Surgery | Admitting: Cardiothoracic Surgery

## 2019-01-09 ENCOUNTER — Encounter: Payer: Self-pay | Admitting: Cardiothoracic Surgery

## 2019-01-09 ENCOUNTER — Ambulatory Visit (INDEPENDENT_AMBULATORY_CARE_PROVIDER_SITE_OTHER): Payer: Medicare Other | Admitting: Cardiothoracic Surgery

## 2019-01-09 ENCOUNTER — Telehealth: Payer: Self-pay

## 2019-01-09 ENCOUNTER — Other Ambulatory Visit: Payer: Self-pay | Admitting: *Deleted

## 2019-01-09 VITALS — BP 147/83 | HR 79 | Resp 20 | Ht 70.0 in | Wt 200.0 lb

## 2019-01-09 DIAGNOSIS — R918 Other nonspecific abnormal finding of lung field: Secondary | ICD-10-CM

## 2019-01-09 DIAGNOSIS — R911 Solitary pulmonary nodule: Secondary | ICD-10-CM

## 2019-01-09 DIAGNOSIS — I712 Thoracic aortic aneurysm, without rupture, unspecified: Secondary | ICD-10-CM

## 2019-01-09 NOTE — Telephone Encounter (Signed)
Call report from [facility] for Ct chest done today.  Patient in the office with a follow-up appointment with Dr. Servando Snare. He is aware.

## 2019-01-09 NOTE — Progress Notes (Signed)
[address]       [place] [address]             [phone]                    Jeremy Johnson [facility] Medical Record #[id number] Date of Birth: [birth year]  Referring: Jeremy Noble, MD Primary Care: Jeremy Noble, MD Cardiology: No primary care provider on file.  Chief Complaint:    Chief Complaint  Patient presents with  . Thoracic Aortic Aneurysm    8 month f/u with chest CT    History of Present Illness:    Jeremy Johnson 73 y.o. male is seen in the office  today for follow-up evaluation evaluation of dilation of his aortic  arch.  Patient comes in today with a follow-up CTA of the chest.  Since last seen patient notes an episode of pneumonia with cough and sputum production in the fall 2019, this is resolved.  He denies any hemoptysis.  He continues to refrain from smoking.  Patient has known chronic renal insufficiency.   Patient had a abdominal aneurysm repaired in 2006 by Dr. Amedeo Plenty.  Because of his long smoking history Dr. Willey Blade had recommended that he have a lung cancer screening CT this was done in October 2018.  A lung cancer screening CT performed in October 28 suggested a dilated aortic arch and the patient was referred for further evaluation.   The patient does not have any family history of aortic dissection.  He did have an uncle who died suddenly in his 31s while in the [place] but with no details his mother at age 45 had a brain aneurysm bleed that was coiled she subsequently died at age 36 with dementia. [name]  History of CAD followed by Dr Lia Foyer, last seen by cardiology 02/09/2010  Current Activity/ Functional Status:  Patient is independent with mobility/ambulation, transfers, ADL's, IADL's.   Zubrod Score: At the time of surgery this patient's most appropriate activity status/level should be described as: []     0    Normal activity, no symptoms [x]     1    Restricted in physical strenuous activity but ambulatory, able to do out light  work []     2    Ambulatory and capable of self care, unable to do work activities, up and about               >50 % of waking hours                              []     3    Only limited self care, in bed greater than 50% of waking hours []     4    Completely disabled, no self care, confined to bed or chair []     5    Moribund   Past Medical History:  Diagnosis Date  . CAD (coronary artery disease)    STENT... MID CIRCUMFLEX...1997  . Chronic kidney disease    STAGE 3  . Degenerative joint disease (DJD) of lumbar spine   . GERD (gastroesophageal reflux disease)   . Gout   . Hyperlipidemia   . Hypertension   . Hypothyroidism   . Leukocytosis    CHRONIC MILD      Family History  Problem Relation Age of Onset  . Stroke Brother     Social History  Socioeconomic History  . Marital status: Married    Spouse name: Not on file  . Number of children: Not on file  . Years of education: Not on file  . Highest education level: Not on file  Occupational History  . Not on file  Social Needs  . Financial resource strain: Not on file  . Food insecurity:    Worry: Not on file    Inability: Not on file  . Transportation needs:    Medical: Not on file    Non-medical: Not on file  Tobacco Use  . Smoking status: Former Smoker    Packs/day: 0.50    Years: 54.00    Pack years: 27.00    Last attempt to quit: 09/17/2017    Years since quitting: 1.3  . Smokeless tobacco: Never Used  Substance and Sexual Activity  . Alcohol use: No  . Drug use: No  . Sexual activity: Not on file  Lifestyle  . Physical activity:    Days per week: Not on file    Minutes per session: Not on file  . Stress: Not on file  Relationships  . Social connections:    Talks on phone: Not on file    Gets together: Not on file    Attends religious service: Not on file    Active member of club or organization: Not on file    Attends meetings of clubs or organizations: Not on file    Relationship status:  Not on file  . Intimate partner violence:    Fear of current or ex partner: Not on file    Emotionally abused: Not on file    Physically abused: Not on file    Forced sexual activity: Not on file  Other Topics Concern  . Not on file  Social History Narrative  . Not on file    Social History   Tobacco Use  Smoking Status Former Smoker  . Packs/day: 0.50  . Years: 54.00  . Pack years: 27.00  . Last attempt to quit: 09/17/2017  . Years since quitting: 1.3  Smokeless Tobacco Never Used    Social History   Substance and Sexual Activity  Alcohol Use No     Allergies  Allergen Reactions  . Penicillins Rash and Other (See Comments)    Has patient had a PCN reaction causing immediate rash, facial/tongue/throat swelling, SOB or lightheadedness with hypotension: No Has patient had a PCN reaction causing severe rash involving mucus membranes or skin necrosis: No Has patient had a PCN reaction that required hospitalization: No Has patient had a PCN reaction occurring within the last 10 years: No If all of the above answers are "NO", then may proceed with Cephalosporin use.     Current Outpatient Medications  Medication Sig Dispense Refill  . allopurinol (ZYLOPRIM) 300 MG tablet Take 300 mg by mouth daily.    [name] amLODipine (NORVASC) 5 MG tablet Take 5 mg by mouth daily.     [name] aspirin EC 81 MG tablet Take 81 mg by mouth daily.    . calcitRIOL (ROCALTROL) 0.25 MCG capsule     . levothyroxine (SYNTHROID, LEVOTHROID) 175 MCG tablet Take 175 mcg by mouth daily before breakfast.    . losartan-hydrochlorothiazide (HYZAAR) 100-25 MG tablet Take 1 tablet by mouth daily.    . metoprolol tartrate (LOPRESSOR) 50 MG tablet Take 25 mg by mouth 2 (two) times daily.     . mometasone (ELOCON) 0.1 % ointment Apply 1 application topically 2 (two)  times daily as needed (for eczema).   2  . omeprazole (PRILOSEC) 20 MG capsule Take 20 mg by mouth daily.    . simvastatin (ZOCOR) 20 MG tablet Take 20 mg  by mouth daily.      No current facility-administered medications for this visit.     Pertinent items are noted in HPI.   Review of Systems:  Review of Systems  Constitutional: Negative.   HENT: Negative.   Eyes: Negative.   Respiratory: Positive for cough and shortness of breath. Negative for hemoptysis, sputum production and wheezing.   Cardiovascular: Negative.   Gastrointestinal: Negative.   Genitourinary: Negative.   Musculoskeletal: Negative.   Skin: Negative.   Endo/Heme/Allergies: Negative.       Physical Exam: BP (!) 147/83   Pulse 79   Resp 20   Ht 5\' 10"  (1.778 m)   Wt 200 lb (90.7 kg)   SpO2 95% Comment: RA  BMI 28.70 kg/m   PHYSICAL EXAMINATION: General appearance: alert and cooperative Head: Normocephalic, without obvious abnormality, atraumatic Neck: no adenopathy, no carotid bruit, no JVD, supple, symmetrical, trachea midline and thyroid not enlarged, symmetric, no tenderness/mass/nodules Lymph nodes: Cervical, supraclavicular, and axillary nodes normal. Resp: clear to auscultation bilaterally Back: symmetric, no curvature. ROM normal. No CVA tenderness. Cardio: regular rate and rhythm, S1, S2 normal, no murmur, click, rub or gallop GI: soft, non-tender; bowel sounds normal; no masses,  no organomegaly Extremities: extremities normal, atraumatic, no cyanosis or edema and Homans sign is negative, no sign of DVT Neurologic: Grossly normal Patient has palpable DP and PT pulses bilaterally palpable brachial and radial pulses bilaterally He has a 5 cm incisional hernia from his previous abdominal aortic aneurysm repair but without evidence of incarceration  Diagnostic Studies & Laboratory data:     Recent Radiology Findings:  Ct Chest Wo Contrast  Result Date: 01/09/2019 CLINICAL DATA:  Followup thoracic aortic aneurysm. EXAM: CT CHEST WITHOUT CONTRAST TECHNIQUE: Multidetector CT imaging of the chest was performed following the standard protocol without  IV contrast. COMPARISON:  5/21/9 FINDINGS: Cardiovascular: Stable appearance of ascending thoracic aortic ectasia measuring 3.9 cm, image 51/4. The transverse thoracic aorta is focally dilated measuring 4.5 cm on the current exam, image 107/6. Previously this measured 4.5 cm. The descending thoracic aorta is diffusely ectopic as well with a focal area of increased caliber measuring 3.4 cm, image 107/6. Unchanged. Mediastinum/Nodes: No axillary or supraclavicular adenopathy Normal appearance of the thyroid gland. The trachea appears patent and is midline. Normal appearance of the esophagus. Low left paratracheal lymph node measures 7 mm, image 47/11. Previously 6 mm. No right paratracheal or subcarinal adenopathy identified. Lungs/Pleura: No pleural effusion. There are advanced smoking related changes throughout both lungs including emphysema and diffuse bronchial wall thickening. There is a mass within the central left lung which crosses the oblique fissure and involves the posterior left upper lobe and superior segment of left lower lobe. On today's study this lesion is clearly identified measuring 2.2 x 3.2 by 4.0 cm. In retrospect this lesion was difficult to distinguish from the adjacent descending thoracic aorta due to lack of IV contrast material at this time this measured 1.0 x 1.5 by 1.8 cm. Highly concerning for primary bronchogenic carcinoma. Small nonspecific nodule in the left upper lobe measures 4 mm, image 40/8. New from previous exam. Stable nodule along the minor fissure of the right lung measuring 8 mm, image 89/8. New right middle lobe lung nodule measures 6 mm, image 96/8. Also new is  a small nodule in the posterior right base measuring 4 mm. Upper Abdomen: No acute abnormality identified. Small left adrenal gland nodule measures 0.9 cm, image 138/11. Unchanged. Musculoskeletal: No aggressive lytic or sclerotic bone lesions. IMPRESSION: 1. Extensive aortic atherosclerosis with focal aneurysmal  dilatation of the transverse aortic arch measures 4.5 cm. This is not significantly changed in the interval. Recommend semi-annual imaging followup by CTA or MRA and referral to cardiothoracic surgery if not already obtained. This recommendation follows 2010 ACCF/AHA/AATS/ACR/ASA/SCA/SCAI/SIR/STS/SVM Guidelines for the Diagnosis and Management of Patients With Thoracic Aortic Disease. Circulation. 2010; 121: X902-I09. Aortic aneurysm NOS (ICD10-I71.9) 2. There is a enlarging mass within the central left lung which crosses the oblique fracture involving the posterior left upper lobe and superior segment of left lower lobe. Highly concerning for primary bronchogenic carcinoma. Further investigation with PET-CT and tissue sampling is advised. 3. There are several new small nonspecific nodules as described above. Attention on follow-up imaging is advised. 4. Diffuse bronchial wall thickening with emphysema, as above; imaging findings suggestive of underlying COPD. 5. Multi vessel coronary artery atherosclerotic calcifications. 6. These results will be called to the ordering clinician or representative by the Radiologist Assistant, and communication documented in the PACS or zVision Dashboard. Electronically Signed   By: Kerby Moors M.D.   On: 01/09/2019 14:27    Ct Chest Wo Contrast  Result Date: 04/02/2018 CLINICAL DATA:  Thoracic aortic aneurysm without rupture. EXAM: CT CHEST WITHOUT CONTRAST TECHNIQUE: Multidetector CT imaging of the chest was performed following the standard protocol without IV contrast. COMPARISON:  CT scan of August 14, 2017. FINDINGS: Cardiovascular: Focal aneurysmal bulge measuring 4.6 cm is noted in the anterior portion of the transverse aortic arch. Atherosclerosis of thoracic aorta is noted. Coronary artery calcifications are noted. No pericardial effusion is noted. Normal cardiac size. Mediastinum/Nodes: No enlarged mediastinal or axillary lymph nodes. Thyroid gland, trachea, and  esophagus demonstrate no significant findings. Lungs/Pleura: No pneumothorax or pleural effusion is noted. Emphysematous disease is noted in both upper lobes. No acute pulmonary disease is noted. Upper Abdomen: Moderate ventral hernia is noted in epigastric region. Musculoskeletal: No chest wall mass or suspicious bone lesions identified. IMPRESSION: Stable focal aneurysmal bulge is seen involving the anterior portion of transverse aortic arch measuring 4.6 cm. Ascending thoracic aortic aneurysm. Recommend semi-annual imaging followup by CTA or MRA and referral to cardiothoracic surgery if not already obtained. This recommendation follows 2010 ACCF/AHA/AATS/ACR/ASA/SCA/SCAI/SIR/STS/SVM Guidelines for the Diagnosis and Management of Patients With Thoracic Aortic Disease. Circulation. 2010; 121: B353-G992. Coronary artery calcifications are noted. Moderate ventral hernia is noted in epigastric region. Aortic Atherosclerosis (ICD10-I70.0) and Emphysema (ICD10-J43.9). Electronically Signed   By: Marijo Conception, M.D.   On: 04/02/2018 13:02     Ct Chest Lung Ca Screen Low Dose W/o Cm  Result Date: 08/14/2017 CLINICAL DATA:  Current asymptomatic smoker. Forty pack-year history. EXAM: CT CHEST WITHOUT CONTRAST LOW-DOSE FOR LUNG CANCER SCREENING TECHNIQUE: Multidetector CT imaging of the chest was performed following the standard protocol without IV contrast. COMPARISON:  None. FINDINGS: Cardiovascular: The heart size is normal. Aortic atherosclerosis identified. Aneurysmal dilatation of the anterior arch measures 4.7 cm, image 21 of series 2. Calcifications within the left main coronary artery, LAD, left circumflex noted. No pericardial effusion. Mediastinum/Nodes: No enlarged mediastinal, hilar, or axillary lymph nodes. Thyroid gland, trachea, and esophagus demonstrate no significant findings. Lungs/Pleura: Moderate to advanced changes of centrilobular emphysema. Scattered nodules are identified the largest is in the  right middle lobe with an  equivalent diameter and 6.4 mm. Upper Abdomen: No acute abnormality. Musculoskeletal: Degenerative disc disease noted within the thoracic spine. No aggressive lytic or sclerotic bone lesions. IMPRESSION: 1. Lung-RADS 2, benign appearance or behavior. Continue annual screening with low-dose chest CT without contrast in 12 months. 2. Aortic Atherosclerosis (ICD10-I70.0) and Emphysema (ICD10-J43.9). 3. Aortic aneurysm NOS (ICD10-I71.9). Aneurysmal dilatation of the anterior arch of the thoracic aorta is identified. Recommend semi-annual imaging followup by CTA or MRA and referral to cardiothoracic surgery if not already obtained. This recommendation follows 2010 ACCF/AHA/AATS/ACR/ASA/SCA/SCAI/SIR/STS/SVM Guidelines for the Diagnosis and Management of Patients With Thoracic Aortic Disease. Circulation. 2010; 121: [phone] Electronically Signed   By: Kerby Moors M.D.   On: 08/14/2017 16:00    I have independently reviewed the above radiology studies  and reviewed the findings with the patient.     Recent Lab Findings: No results found for: WBC, HGB, HCT, PLT, GLUCOSE, CHOL, TRIG, HDL, LDLDIRECT, LDLCALC, ALT, AST, NA, K, CL, CREATININE, BUN, CO2, TSH, INR, GLUF, HGBA1C                                Assessment / Plan:   1/Stable focal aneurysmal bulge is seen involving the anterior portion of transverse aortic arch measuring 4.6 cm. Ascending thoracic aortic aneurysm 2/Stage II chronic renal failure #3 calcification of the left main coronary artery LAD and circumflex history of myocardial infarction 1999 with stent placement-last seen by cardiology in 2011 #4 moderate to advanced changes of central lobar emphysema on chest CT #5 There is a enlarging mass within the central left lung which crosses the oblique fracture involving the posterior left upper lobe and superior segment of left lower lobe. Highly concerning for primary bronchogenic carcinoma.  This mass is adjacent to  the aorta and approximately 2 cm in size.  I have reviewed with the patient and his wife the CT findings and the concern of the enlarging mass in the left lower lobe abutting the descending thoracic aorta.  The patient did not receive contrast with the CT of the chest, but the area in question appears adjacent to the aorta but not actually part of the aorta.  I discussed with he and his wife that this may represent carcinoma of the lung.  We will obtain a PET scan to further evaluate, save the CT scan data done today for possible navigation bronchoscopy, and obtain pulmonary function studies to evaluate his underlying known pulmonary disease.   Grace Isaac MD      [address] [place] [address] Office [phone]   Beeper [phone]  01/09/2019 2:36 PM

## 2019-01-22 ENCOUNTER — Ambulatory Visit (HOSPITAL_COMMUNITY)
Admission: RE | Admit: 2019-01-22 | Discharge: 2019-01-22 | Disposition: A | Discharge status: Home / Self Care | Payer: Medicare Other | Source: Ambulatory Visit | Attending: Cardiothoracic Surgery | Admitting: Cardiothoracic Surgery

## 2019-01-22 ENCOUNTER — Other Ambulatory Visit: Payer: Self-pay

## 2019-01-22 ENCOUNTER — Encounter (HOSPITAL_COMMUNITY)
Admission: RE | Admit: 2019-01-22 | Discharge: 2019-01-22 | Disposition: A | Discharge status: Home / Self Care | Payer: Medicare Other | Source: Ambulatory Visit | Attending: Cardiothoracic Surgery | Admitting: Cardiothoracic Surgery

## 2019-01-22 DIAGNOSIS — R918 Other nonspecific abnormal finding of lung field: Secondary | ICD-10-CM | POA: Insufficient documentation

## 2019-01-22 DIAGNOSIS — R911 Solitary pulmonary nodule: Secondary | ICD-10-CM | POA: Insufficient documentation

## 2019-01-22 LAB — PULMONARY FUNCTION TEST
DL/VA % pred: 80 %
DL/VA: 3.26 ml/min/mmHg/L
DLCO unc % pred: 70 %
DLCO unc: 17.14 ml/min/mmHg
FEF 25-75 Post: 2 L/sec
FEF 25-75 Pre: 1.63 L/sec
FEF2575-%Change-Post: 22 %
FEF2575-%Pred-Post: 89 %
FEF2575-%Pred-Pre: 73 %
FEV1-%Change-Post: 2 %
FEV1-%Pred-Post: 82 %
FEV1-%Pred-Pre: 79 %
FEV1-Post: 2.46 L
FEV1-Pre: 2.39 L
FEV1FVC-%Change-Post: -2 %
FEV1FVC-%Pred-Pre: 98 %
FEV6-%Change-Post: 4 %
FEV6-%Pred-Post: 90 %
FEV6-%Pred-Pre: 85 %
FEV6-Post: 3.47 L
FEV6-Pre: 3.31 L
FEV6FVC-%Change-Post: 0 %
FEV6FVC-%Pred-Post: 104 %
FEV6FVC-%Pred-Pre: 105 %
FVC-%Change-Post: 5 %
FVC-%Pred-Post: 85 %
FVC-%Pred-Pre: 81 %
FVC-Post: 3.52 L
FVC-Pre: 3.33 L
Post FEV1/FVC ratio: 70 %
Post FEV6/FVC ratio: 99 %
Pre FEV1/FVC ratio: 72 %
Pre FEV6/FVC Ratio: 99 %
RV % pred: 107 %
RV: 2.59 L
TLC % pred: 92 %
TLC: 6.25 L

## 2019-01-22 LAB — GLUCOSE, CAPILLARY: Glucose-Capillary: 134 mg/dL — ABNORMAL HIGH (ref 70–99)

## 2019-01-22 MED ORDER — FLUDEOXYGLUCOSE F - 18 (FDG) INJECTION
11.0000 | Freq: Once | INTRAVENOUS | Status: AC
  Administered 2019-01-22: 11 via INTRAVENOUS

## 2019-01-22 MED ORDER — ALBUTEROL SULFATE (2.5 MG/3ML) 0.083% IN NEBU
2.5000 mg | INHALATION_SOLUTION | Freq: Once | RESPIRATORY_TRACT | Status: AC
  Administered 2019-01-22: 2.5 mg via RESPIRATORY_TRACT

## 2019-01-23 ENCOUNTER — Other Ambulatory Visit: Payer: Self-pay | Admitting: *Deleted

## 2019-01-23 ENCOUNTER — Ambulatory Visit (INDEPENDENT_AMBULATORY_CARE_PROVIDER_SITE_OTHER): Payer: Medicare Other | Admitting: Cardiothoracic Surgery

## 2019-01-23 ENCOUNTER — Other Ambulatory Visit: Payer: Self-pay

## 2019-01-23 VITALS — BP 150/84 | HR 86 | Ht 70.0 in | Wt 200.0 lb

## 2019-01-23 DIAGNOSIS — I712 Thoracic aortic aneurysm, without rupture, unspecified: Secondary | ICD-10-CM

## 2019-01-23 DIAGNOSIS — R918 Other nonspecific abnormal finding of lung field: Secondary | ICD-10-CM | POA: Diagnosis not present

## 2019-01-23 NOTE — Progress Notes (Signed)
The proposed treatment discussed in cancer conference 01/23/2019 is for discussion purpose only and is not a binding recommendation. The patient was not physically examine nor present for their treatment options.  Therefore, final treatment plans cannot be decided.

## 2019-01-23 NOTE — Progress Notes (Signed)
[address]       [place] [address]             [phone]                    Jeremy Johnson Medical Record #[id number] Date of Birth: 1946-10-22  Referring: [name], MD Primary Care: [name], MD Cardiology: No primary care provider on file.  Chief Complaint:    Chief Complaint  Patient presents with   Lung Mass    2 week f/u review PET Scan and PFT's   Thoracic Aortic Aneurysm    History of Present Illness:    Jeremy Johnson 73 y.o. male is seen in the office  today for follow-up evaluation evaluation of dilation of his aortic  arch.  Patient comes in today with a follow-up CTA of the chest.  Since last seen patient notes an episode of pneumonia with cough and sputum production in the fall 2019, this is resolved.  He denies any hemoptysis.  He continues to refrain from smoking.  Patient has known chronic renal insufficiency.   Patient had a abdominal aneurysm repaired in 2006 by Dr. Amedeo Plenty.  Because of his long smoking history Dr. Willey Blade had recommended that he have a lung cancer screening CT this was done in October 2018.  A lung cancer screening CT performed in October 28 suggested a dilated aortic arch and the patient was referred for further evaluation.   The patient does not have any family history of aortic dissection.  He did have an uncle who died suddenly in his 80s while in the [place] but with no details his mother at age 8 had a brain aneurysm bleed that was coiled she subsequently died at age 69 with dementia. [name]  History of CAD followed by Dr Lia Foyer, last seen by cardiology 02/09/2010  Current Activity/ Functional Status:  Patient is independent with mobility/ambulation, transfers, ADL's, IADL's.   Zubrod Score: At the time of surgery this patients most appropriate activity status/level should be described as: []     0    Normal activity, no symptoms [x]     1    Restricted in physical strenuous activity but  ambulatory, able to do out light work []     2    Ambulatory and capable of self care, unable to do work activities, up and about               >50 % of waking hours                              []     3    Only limited self care, in bed greater than 50% of waking hours []     4    Completely disabled, no self care, confined to bed or chair []     5    Moribund   Past Medical History:  Diagnosis Date   CAD (coronary artery disease)    STENT... MID CIRCUMFLEX...1997   Chronic kidney disease    STAGE 3   Degenerative joint disease (DJD) of lumbar spine    GERD (gastroesophageal reflux disease)    Gout    Hyperlipidemia    Hypertension    Hypothyroidism    Leukocytosis    CHRONIC MILD      Family History  Problem Relation Age of Onset   Stroke Brother  Social History   Socioeconomic History   Marital status: Married    Spouse name: Not on file   Number of children: Not on file   Years of education: Not on file   Highest education level: Not on file  Occupational History   Not on file  Social Needs   Financial resource strain: Not on file   Food insecurity:    Worry: Not on file    Inability: Not on file   Transportation needs:    Medical: Not on file    Non-medical: Not on file  Tobacco Use   Smoking status: Former Smoker    Packs/day: 0.50    Years: 54.00    Pack years: 27.00    Last attempt to quit: 09/17/2017    Years since quitting: 1.3   Smokeless tobacco: Never Used  Substance and Sexual Activity   Alcohol use: No   Drug use: No   Sexual activity: Not on file  Lifestyle   Physical activity:    Days per week: Not on file    Minutes per session: Not on file   Stress: Not on file  Relationships   Social connections:    Talks on phone: Not on file    Gets together: Not on file    Attends religious service: Not on file    Active member of club or organization: Not on file    Attends meetings of clubs or organizations: Not on  file    Relationship status: Not on file   Intimate partner violence:    Fear of current or ex partner: Not on file    Emotionally abused: Not on file    Physically abused: Not on file    Forced sexual activity: Not on file  Other Topics Concern   Not on file  Social History Narrative   Not on file    Social History   Tobacco Use  Smoking Status Former Smoker   Packs/day: 0.50   Years: 54.00   Pack years: 27.00   Last attempt to quit: 09/17/2017   Years since quitting: 1.3  Smokeless Tobacco Never Used    Social History   Substance and Sexual Activity  Alcohol Use No     Allergies  Allergen Reactions   Penicillins Rash and Other (See Comments)    Has patient had a PCN reaction causing immediate rash, facial/tongue/throat swelling, SOB or lightheadedness with hypotension: No Has patient had a PCN reaction causing severe rash involving mucus membranes or skin necrosis: No Has patient had a PCN reaction that required hospitalization: No Has patient had a PCN reaction occurring within the last 10 years: No If all of the above answers are "NO", then may proceed with Cephalosporin use.     Current Outpatient Medications  Medication Sig Dispense Refill   allopurinol (ZYLOPRIM) 300 MG tablet Take 300 mg by mouth daily.     amLODipine (NORVASC) 5 MG tablet Take 5 mg by mouth daily.      aspirin EC 81 MG tablet Take 81 mg by mouth daily.     calcitRIOL (ROCALTROL) 0.25 MCG capsule Take 0.25 mcg by mouth daily.      levothyroxine (SYNTHROID, LEVOTHROID) 175 MCG tablet Take 175 mcg by mouth daily before breakfast.     losartan-hydrochlorothiazide (HYZAAR) 100-25 MG tablet Take 1 tablet by mouth daily.     metoprolol tartrate (LOPRESSOR) 50 MG tablet Take 25 mg by mouth 2 (two) times daily.      mometasone (  ELOCON) 0.1 % ointment Apply 1 application topically 2 (two) times daily as needed (for eczema).   2   omeprazole (PRILOSEC) 20 MG capsule Take 20 mg by  mouth daily.     simvastatin (ZOCOR) 20 MG tablet Take 20 mg by mouth daily.      No current facility-administered medications for this visit.     Pertinent items are noted in HPI.   Review of Systems:   Review of Systems  Constitutional: Negative.   HENT: Negative.   Eyes: Negative.   Respiratory: Positive for cough. Negative for hemoptysis, sputum production, shortness of breath and wheezing.   Cardiovascular: Negative.   Gastrointestinal: Negative.   Genitourinary: Negative.   Musculoskeletal: Negative.   Skin: Negative.   Neurological: Negative.   Endo/Heme/Allergies: Negative.   Psychiatric/Behavioral: Negative.      Physical Exam: BP (!) 150/84    Pulse 86    Ht 5\' 10"  (1.778 m)    Wt 200 lb (90.7 kg)    SpO2 97% Comment: RA   BMI 28.70 kg/m   PHYSICAL EXAMINATION: General appearance: alert and cooperative Head: Normocephalic, without obvious abnormality, atraumatic Neck: no adenopathy, no carotid bruit, no JVD, supple, symmetrical, trachea midline and thyroid not enlarged, symmetric, no tenderness/mass/nodules Lymph nodes: Cervical, supraclavicular, and axillary nodes normal. Resp: clear to auscultation bilaterally Back: symmetric, no curvature. ROM normal. No CVA tenderness. Cardio: regular rate and rhythm, S1, S2 normal, no murmur, click, rub or gallop GI: soft, non-tender; bowel sounds normal; no masses,  no organomegaly Extremities: extremities normal, atraumatic, no cyanosis or edema and Homans sign is negative, no sign of DVT Neurologic: Grossly normal  Patient has palpable DP and PT pulses bilaterally palpable brachial and radial pulses bilaterally He has a 5 cm incisional hernia from his previous abdominal aortic aneurysm repair but without evidence of incarceration  Diagnostic Studies & Laboratory data:     Recent Radiology Findings: Nm Pet Image Initial (pi) Skull Base To Thigh  Result Date: 01/22/2019 CLINICAL DATA:  Initial treatment strategy for  lung mass. EXAM: NUCLEAR MEDICINE PET SKULL BASE TO THIGH TECHNIQUE: 11.0 mCi F-18 FDG was injected intravenously. Full-ring PET imaging was performed from the skull base to thigh after the radiotracer. CT data was obtained and used for attenuation correction and anatomic localization. Fasting blood glucose: 134 mg/dl COMPARISON:  01/09/2019 FINDINGS: Mediastinal blood pool activity: SUV max 3.09 NECK: No hypermetabolic lymph nodes in the neck. Incidental CT findings: none CHEST: No hypermetabolic supraclavicular or axillary lymph nodes. Mild to moderate uptake associated with low left paratracheal lymph nodes with SUV max of 3.59. There is a left hilar lymph node with intense uptake within SUV max of 15.66. The mass within the central left lung which crosses the oblique fissure and involves the posterior left upper lobe and superior segment of left lower lobe is again noted. This exhibits intense radiotracer uptake within SUV max of 14.27. No additional hypermetabolic nodules or masses identified. Small perifissural nodules with in the minor fissure of the right lung are less than 1 cm and too small to characterize. No additional hypermetabolic pulmonary nodules or masses noted. Incidental CT findings: Thoracic aortic aneurysm and aortic atherosclerosis. Coronary artery calcifications. Moderate to advanced changes of emphysema. ABDOMEN/PELVIS: No abnormal hypermetabolic activity within the liver, pancreas, adrenal glands, or spleen. No hypermetabolic lymph nodes in the abdomen or pelvis. Incidental CT findings: Extensive aortic atherosclerosis. The infrarenal abdominal aorta measures 3.3 cm in maximum AP dimension. Right kidney cysts noted. Bilateral  fat containing inguinal hernias. SKELETON: No focal hypermetabolic activity to suggest skeletal metastasis. Incidental CT findings: none IMPRESSION: 1. Chest the central left perihilar lung mass which crosses the oblique fissure is again noted. This exhibits intense  radiotracer uptake within SUV max of 14.2. Hypermetabolic left hilar lymph node is also noted. Mild to moderate increased uptake identified within low left paratracheal lymph nodes, equivocal for metastatic adenopathy. No hypermetabolic contralateral mediastinal, subcarinal or hilar lymph nodes and no evidence for distant metastatic disease. 2. Aortic atherosclerosis with thoracic and infrarenal abdominal aortic aneurysm. Electronically Signed   By: Kerby Moors M.D.   On: 01/22/2019 14:11   Ct Chest Wo Contrast  Result Date: 01/09/2019 CLINICAL DATA:  Followup thoracic aortic aneurysm. EXAM: CT CHEST WITHOUT CONTRAST TECHNIQUE: Multidetector CT imaging of the chest was performed following the standard protocol without IV contrast. COMPARISON:  5/21/9 FINDINGS: Cardiovascular: Stable appearance of ascending thoracic aortic ectasia measuring 3.9 cm, image 51/4. The transverse thoracic aorta is focally dilated measuring 4.5 cm on the current exam, image 107/6. Previously this measured 4.5 cm. The descending thoracic aorta is diffusely ectopic as well with a focal area of increased caliber measuring 3.4 cm, image 107/6. Unchanged. Mediastinum/Nodes: No axillary or supraclavicular adenopathy Normal appearance of the thyroid gland. The trachea appears patent and is midline. Normal appearance of the esophagus. Low left paratracheal lymph node measures 7 mm, image 47/11. Previously 6 mm. No right paratracheal or subcarinal adenopathy identified. Lungs/Pleura: No pleural effusion. There are advanced smoking related changes throughout both lungs including emphysema and diffuse bronchial wall thickening. There is a mass within the central left lung which crosses the oblique fissure and involves the posterior left upper lobe and superior segment of left lower lobe. On today's study this lesion is clearly identified measuring 2.2 x 3.2 by 4.0 cm. In retrospect this lesion was difficult to distinguish from the adjacent  descending thoracic aorta due to lack of IV contrast material at this time this measured 1.0 x 1.5 by 1.8 cm. Highly concerning for primary bronchogenic carcinoma. Small nonspecific nodule in the left upper lobe measures 4 mm, image 40/8. New from previous exam. Stable nodule along the minor fissure of the right lung measuring 8 mm, image 89/8. New right middle lobe lung nodule measures 6 mm, image 96/8. Also new is a small nodule in the posterior right base measuring 4 mm. Upper Abdomen: No acute abnormality identified. Small left adrenal gland nodule measures 0.9 cm, image 138/11. Unchanged. Musculoskeletal: No aggressive lytic or sclerotic bone lesions. IMPRESSION: 1. Extensive aortic atherosclerosis with focal aneurysmal dilatation of the transverse aortic arch measures 4.5 cm. This is not significantly changed in the interval. Recommend semi-annual imaging followup by CTA or MRA and referral to cardiothoracic surgery if not already obtained. This recommendation follows 2010 ACCF/AHA/AATS/ACR/ASA/SCA/SCAI/SIR/STS/SVM Guidelines for the Diagnosis and Management of Patients With Thoracic Aortic Disease. Circulation. 2010; 121: N235-T73. Aortic aneurysm NOS (ICD10-I71.9) 2. There is a enlarging mass within the central left lung which crosses the oblique fracture involving the posterior left upper lobe and superior segment of left lower lobe. Highly concerning for primary bronchogenic carcinoma. Further investigation with PET-CT and tissue sampling is advised. 3. There are several new small nonspecific nodules as described above. Attention on follow-up imaging is advised. 4. Diffuse bronchial wall thickening with emphysema, as above; imaging findings suggestive of underlying COPD. 5. Multi vessel coronary artery atherosclerotic calcifications. 6. These results will be called to the ordering clinician or representative by the Radiologist  Assistant, and communication documented in the PACS or zVision Dashboard.  Electronically Signed   By: Kerby Moors M.D.   On: 01/09/2019 14:27    Ct Chest Wo Contrast  Result Date: 04/02/2018 CLINICAL DATA:  Thoracic aortic aneurysm without rupture. EXAM: CT CHEST WITHOUT CONTRAST TECHNIQUE: Multidetector CT imaging of the chest was performed following the standard protocol without IV contrast. COMPARISON:  CT scan of August 14, 2017. FINDINGS: Cardiovascular: Focal aneurysmal bulge measuring 4.6 cm is noted in the anterior portion of the transverse aortic arch. Atherosclerosis of thoracic aorta is noted. Coronary artery calcifications are noted. No pericardial effusion is noted. Normal cardiac size. Mediastinum/Nodes: No enlarged mediastinal or axillary lymph nodes. Thyroid gland, trachea, and esophagus demonstrate no significant findings. Lungs/Pleura: No pneumothorax or pleural effusion is noted. Emphysematous disease is noted in both upper lobes. No acute pulmonary disease is noted. Upper Abdomen: Moderate ventral hernia is noted in epigastric region. Musculoskeletal: No chest wall mass or suspicious bone lesions identified. IMPRESSION: Stable focal aneurysmal bulge is seen involving the anterior portion of transverse aortic arch measuring 4.6 cm. Ascending thoracic aortic aneurysm. Recommend semi-annual imaging followup by CTA or MRA and referral to cardiothoracic surgery if not already obtained. This recommendation follows 2010 ACCF/AHA/AATS/ACR/ASA/SCA/SCAI/SIR/STS/SVM Guidelines for the Diagnosis and Management of Patients With Thoracic Aortic Disease. Circulation. 2010; 121: K917-H150. Coronary artery calcifications are noted. Moderate ventral hernia is noted in epigastric region. Aortic Atherosclerosis (ICD10-I70.0) and Emphysema (ICD10-J43.9). Electronically Signed   By: Marijo Conception, M.D.   On: 04/02/2018 13:02     Ct Chest Lung Ca Screen Low Dose W/o Cm  Result Date: 08/14/2017 CLINICAL DATA:  Current asymptomatic smoker. Forty pack-year history. EXAM: CT CHEST  WITHOUT CONTRAST LOW-DOSE FOR LUNG CANCER SCREENING TECHNIQUE: Multidetector CT imaging of the chest was performed following the standard protocol without IV contrast. COMPARISON:  None. FINDINGS: Cardiovascular: The heart size is normal. Aortic atherosclerosis identified. Aneurysmal dilatation of the anterior arch measures 4.7 cm, image 21 of series 2. Calcifications within the left main coronary artery, LAD, left circumflex noted. No pericardial effusion. Mediastinum/Nodes: No enlarged mediastinal, hilar, or axillary lymph nodes. Thyroid gland, trachea, and esophagus demonstrate no significant findings. Lungs/Pleura: Moderate to advanced changes of centrilobular emphysema. Scattered nodules are identified the largest is in the right middle lobe with an equivalent diameter and 6.4 mm. Upper Abdomen: No acute abnormality. Musculoskeletal: Degenerative disc disease noted within the thoracic spine. No aggressive lytic or sclerotic bone lesions. IMPRESSION: 1. Lung-RADS 2, benign appearance or behavior. Continue annual screening with low-dose chest CT without contrast in 12 months. 2. Aortic Atherosclerosis (ICD10-I70.0) and Emphysema (ICD10-J43.9). 3. Aortic aneurysm NOS (ICD10-I71.9). Aneurysmal dilatation of the anterior arch of the thoracic aorta is identified. Recommend semi-annual imaging followup by CTA or MRA and referral to cardiothoracic surgery if not already obtained. This recommendation follows 2010 ACCF/AHA/AATS/ACR/ASA/SCA/SCAI/SIR/STS/SVM Guidelines for the Diagnosis and Management of Patients With Thoracic Aortic Disease. Circulation. 2010; 121: [id number] Electronically Signed   By: Kerby Moors M.D.   On: 08/14/2017 16:00    I have independently reviewed the above radiology studies  and reviewed the findings with the patient.     Recent Lab Findings: No results found for: WBC, HGB, HCT, PLT, GLUCOSE, CHOL, TRIG, HDL, LDLDIRECT, LDLCALC, ALT, AST, NA, K, CL, CREATININE, BUN, CO2, TSH, INR,  GLUF, HGBA1C  PFT' FEV1 2.39  79% DLCO 17.14 70%  Assessment / Plan:   1/Stable focal aneurysmal bulge is seen involving the anterior portion of transverse aortic arch measuring 4.6 cm. Ascending thoracic aortic aneurysm 2/Stage II chronic renal failure #3 calcification of the left main coronary artery LAD and circumflex history of myocardial infarction 1999 with stent placement-last seen by cardiology in 2011 #4 moderate to advanced changes of central lobar emphysema on chest CT #5 There is a enlarging mass within the central left lung which crosses the oblique fracture involving the posterior left upper lobe and superior segment of left lower lobe. Highly concerning for primary bronchogenic carcinoma.  This mass is adjacent to the aorta and approximately 2 cm in size.  I have reviewed with the patient and his wife the CT findings and the concern of the enlarging mass in the left lower lobe abutting the descending thoracic aorta.  The patient did not receive contrast with the CT of the chest, but the area in question appears adjacent to the aorta but not actually part of the aorta.  I discussed with he and his wife that this may represent carcinoma of the lung.   Patient's findings are presented in multidisciplinary thoracic oncology conference this morning including reviewing the PET scan done yesterday.  I recommend to the patient that we proceed with bronchoscopy ebus and navigation bronchoscopy  try to obtain a tissue diagnosis and possible cyst determine nodal involvement.  We will then have the patient return to the multidisciplinary thoracic oncology clinic to review the findings and make a decision on multimodality treatment if biopsy-proven carcinoma of the lung.  Grace Isaac MD      [address] [place] [address] Office [phone]   Beeper [phone]  01/23/2019 4:58 PM

## 2019-01-24 ENCOUNTER — Encounter (HOSPITAL_COMMUNITY): Payer: Self-pay

## 2019-01-24 ENCOUNTER — Encounter (HOSPITAL_COMMUNITY)
Admission: RE | Admit: 2019-01-24 | Discharge: 2019-01-24 | Disposition: A | Discharge status: Home / Self Care | Payer: Medicare Other | Source: Ambulatory Visit | Attending: Cardiothoracic Surgery | Admitting: Cardiothoracic Surgery

## 2019-01-24 ENCOUNTER — Ambulatory Visit (HOSPITAL_COMMUNITY)
Admission: RE | Admit: 2019-01-24 | Discharge: 2019-01-24 | Disposition: A | Discharge status: Home / Self Care | Payer: Medicare Other | Source: Ambulatory Visit | Attending: Cardiothoracic Surgery | Admitting: Cardiothoracic Surgery

## 2019-01-24 DIAGNOSIS — R918 Other nonspecific abnormal finding of lung field: Secondary | ICD-10-CM | POA: Diagnosis not present

## 2019-01-24 DIAGNOSIS — J929 Pleural plaque without asbestos: Secondary | ICD-10-CM | POA: Diagnosis not present

## 2019-01-24 DIAGNOSIS — R001 Bradycardia, unspecified: Secondary | ICD-10-CM | POA: Diagnosis not present

## 2019-01-24 HISTORY — DX: Acute myocardial infarction, unspecified: I21.9

## 2019-01-24 HISTORY — DX: Incisional hernia without obstruction or gangrene: K43.2

## 2019-01-24 LAB — CBC
HCT: 37.8 % — ABNORMAL LOW (ref 39.0–52.0)
Hemoglobin: 12.6 g/dL — ABNORMAL LOW (ref 13.0–17.0)
MCH: 31 pg (ref 26.0–34.0)
MCHC: 33.3 g/dL (ref 30.0–36.0)
MCV: 92.9 fL (ref 80.0–100.0)
Platelets: 282 10*3/uL (ref 150–400)
RBC: 4.07 MIL/uL — ABNORMAL LOW (ref 4.22–5.81)
RDW: 15.1 % (ref 11.5–15.5)
WBC: 10.3 10*3/uL (ref 4.0–10.5)
nRBC: 0 % (ref 0.0–0.2)

## 2019-01-24 LAB — COMPREHENSIVE METABOLIC PANEL
ALT: 15 U/L (ref 0–44)
AST: 20 U/L (ref 15–41)
Albumin: 3.8 g/dL (ref 3.5–5.0)
Alkaline Phosphatase: 82 U/L (ref 38–126)
Anion gap: 10 (ref 5–15)
BUN: 30 mg/dL — ABNORMAL HIGH (ref 8–23)
CO2: 21 mmol/L — ABNORMAL LOW (ref 22–32)
Calcium: 9.2 mg/dL (ref 8.9–10.3)
Chloride: 107 mmol/L (ref 98–111)
Creatinine, Ser: 2.4 mg/dL — ABNORMAL HIGH (ref 0.61–1.24)
GFR calc Af Amer: 30 mL/min — ABNORMAL LOW (ref >60)
GFR calc non Af Amer: 26 mL/min — ABNORMAL LOW (ref >60)
Glucose, Bld: 117 mg/dL — ABNORMAL HIGH (ref 70–99)
Potassium: 3.8 mmol/L (ref 3.5–5.1)
Sodium: 138 mmol/L (ref 135–145)
Total Bilirubin: 0.7 mg/dL (ref 0.3–1.2)
Total Protein: 7.4 g/dL (ref 6.5–8.1)

## 2019-01-24 LAB — SURGICAL PCR SCREEN
MRSA, PCR: NEGATIVE
Staphylococcus aureus: NEGATIVE

## 2019-01-24 LAB — PROTIME-INR
INR: 1 (ref 0.8–1.2)
Prothrombin Time: 12.6 seconds (ref 11.4–15.2)

## 2019-01-24 LAB — APTT: aPTT: 32 seconds (ref 24–36)

## 2019-01-24 NOTE — Anesthesia Preprocedure Evaluation (Addendum)
Anesthesia Evaluation  Patient identified by MRN, date of birth, ID band Patient awake    Reviewed: Allergy & Precautions, NPO status , Patient's Chart, lab work & pertinent test results, reviewed documented beta blocker date and time   Airway Mallampati: I  TM Distance: >3 FB Neck ROM: Full    Dental no notable dental hx. (+) Teeth Intact, Dental Advisory Given   Pulmonary neg pulmonary ROS, former smoker,    Pulmonary exam normal breath sounds clear to auscultation       Cardiovascular hypertension, Pt. on medications and Pt. on home beta blockers + CAD, + Past MI, + Cardiac Stents (1997) and + Peripheral Vascular Disease (AAA repair 2006)  Normal cardiovascular exam Rhythm:Regular Rate:Normal  12/2018 Stable appearance of ascending thoracic aortic ectasia measuring 3.9 cm, image 51/4. The transverse thoracic aorta is focally dilated measuring 4.5 cm on the current exam, image 107/6. Previously this measured 4.5 cm. The descending thoracic aorta is diffusely ectopic as well with a focal area of increased caliber measuring 3.4 cm, image 107/6. Unchanged   Neuro/Psych negative neurological ROS  negative psych ROS   GI/Hepatic Neg liver ROS, GERD  Medicated and Controlled,  Endo/Other  Hypothyroidism   Renal/GU Renal InsufficiencyRenal disease  negative genitourinary   Musculoskeletal  (+) Arthritis ,   Abdominal   Peds  Hematology negative hematology ROS (+)   Anesthesia Other Findings Left lung mass  Reproductive/Obstetrics                          Anesthesia Physical Anesthesia Plan  ASA: III  Anesthesia Plan: General   Post-op Pain Management:    Induction: Intravenous  PONV Risk Score and Plan: 2 and Dexamethasone and Ondansetron  Airway Management Planned: Oral ETT  Additional Equipment:   Intra-op Plan:   Post-operative Plan: Extubation in OR  Informed Consent: I have  reviewed the patients History and Physical, chart, labs and discussed the procedure including the risks, benefits and alternatives for the proposed anesthesia with the patient or authorized representative who has indicated his/her understanding and acceptance.     Dental advisory given  Plan Discussed with: CRNA  Anesthesia Plan Comments:       Anesthesia Quick Evaluation

## 2019-01-24 NOTE — Progress Notes (Signed)
PCP - Dr. Woody Seller  Cardiologist - Denies  Chest x-ray - 01/24/2019  EKG - 01/24/2019  Stress Test - 2006  ECHO - Denies  Cardiac Cath - 2008- Stent x1  AICD-na   PM-na LOOP-na  Sleep Study - Denies CPAP - None  LABS- 01/24/2019: CBC, CMP, PT, PTT, PCR  ASA- LD- 3/15   Anesthesia- Yes-cardiac history  Pt denies having chest pain, sob, or fever at this time. All instructions explained to the pt, with a verbal understanding of the material. Pt agrees to go over the instructions while at home for a better understanding. The opportunity to ask questions was provided.

## 2019-01-24 NOTE — Pre-Procedure Instructions (Signed)
OLVIN ROHR  01/24/2019      [id number] - [place], [place] AT [address] [place] [address] Phone: [phone] Fax: [phone]    Your procedure is scheduled on Mon., January 27, 2019 from 7:30AM-9:38AM  Report to [facility] Entrance "A" at 5:30AM  Call this number if you have problems the morning of surgery:  [phone]   Remember:  Do not eat or drink after midnight on March 15th    Take these medicines the morning of surgery with A SIP OF WATER: Allopurinol (ZYLOPRIM), AmLODipine (NORVASC), Levothyroxine (SYNTHROID, LEVOTHROID), Metoprolol tartrate (LOPRESSOR), and Omeprazole (PRILOSEC)   Follow your surgeon's instructions on when to stop Aspirin.  If no instructions were given by your surgeon then you will need to call the office to get those instructions.    As of today, stop taking all Other Aspirin Products, Vitamins, Fish oils, and Herbal medications. Also stop all NSAIDS i.e. Advil, Ibuprofen, Motrin, Aleve, Anaprox, Naproxen, BC, Goody Powders, and all Supplements.    Do not wear jewelry.  Do not wear lotions, powders, colognes, or deodorant.  Do not shave 48 hours prior to surgery.  Men may shave face.  Do not bring valuables to the hospital.  [facility] is not responsible for any belongings or valuables.  Contacts, dentures or bridgework may not be worn into surgery.  Leave your suitcase in the car.  After surgery it may be brought to your room.  For patients admitted to the hospital, discharge time will be determined by your treatment team.  Patients discharged the day of surgery will not be allowed to drive home.   Special instructions:   [place]- Preparing For Surgery  Before surgery, you can play an important role. Because skin is not sterile, your skin needs to be as free of germs as possible. You can reduce the number of germs on your skin by washing with  CHG (chlorahexidine gluconate) Soap before surgery.  CHG is an antiseptic cleaner which kills germs and bonds with the skin to continue killing germs even after washing.    Oral Hygiene is also important to reduce your risk of infection.  Remember - BRUSH YOUR TEETH THE MORNING OF SURGERY WITH YOUR REGULAR TOOTHPASTE  Please do not use if you have an allergy to CHG or antibacterial soaps. If your skin becomes reddened/irritated stop using the CHG.  Do not shave (including legs and underarms) for at least 48 hours prior to first CHG shower. It is OK to shave your face.  Please follow these instructions carefully.   1. Shower the NIGHT BEFORE SURGERY and the MORNING OF SURGERY with CHG.   2. If you chose to wash your hair, wash your hair first as usual with your normal shampoo.  3. After you shampoo, rinse your hair and body thoroughly to remove the shampoo.  4. Use CHG as you would any other liquid soap. You can apply CHG directly to the skin and wash gently with a scrungie or a clean washcloth.   5. Apply the CHG Soap to your body ONLY FROM THE NECK DOWN.  Do not use on open wounds or open sores. Avoid contact with your eyes, ears, mouth and genitals (private parts). Wash Face and genitals (private parts)  with your normal soap.  6. Wash thoroughly, paying special attention to the area where your surgery will be  performed.  7. Thoroughly rinse your body with warm water from the neck down.  8. DO NOT shower/wash with your normal soap after using and rinsing off the CHG Soap.  9. Pat yourself dry with a CLEAN TOWEL.  10. Wear CLEAN PAJAMAS to bed the night before surgery, wear comfortable clothes the morning of surgery  11. Place CLEAN SHEETS on your bed the night of your first shower and DO NOT SLEEP WITH PETS.  Day of Surgery:  Do not apply any deodorants/lotions.  Please wear clean clothes to the hospital/surgery center.   Remember to brush your teeth WITH YOUR REGULAR  TOOTHPASTE.  Please read over the following fact sheets that you were given. Pain Booklet, Coughing and Deep Breathing, MRSA Information and Surgical Site Infection Prevention

## 2019-01-26 ENCOUNTER — Encounter (HOSPITAL_COMMUNITY): Payer: Self-pay | Admitting: Certified Registered Nurse Anesthetist

## 2019-01-27 ENCOUNTER — Ambulatory Visit (HOSPITAL_COMMUNITY): Payer: Medicare Other | Admitting: Physician Assistant

## 2019-01-27 ENCOUNTER — Ambulatory Visit (HOSPITAL_COMMUNITY): Payer: Medicare Other

## 2019-01-27 ENCOUNTER — Encounter (HOSPITAL_COMMUNITY)
Admission: RE | Disposition: A | Discharge status: Home / Self Care | Payer: Self-pay | Source: Home / Self Care | Attending: Cardiothoracic Surgery

## 2019-01-27 ENCOUNTER — Other Ambulatory Visit: Payer: Self-pay

## 2019-01-27 ENCOUNTER — Ambulatory Visit (HOSPITAL_COMMUNITY): Payer: Medicare Other | Admitting: Certified Registered Nurse Anesthetist

## 2019-01-27 ENCOUNTER — Ambulatory Visit (HOSPITAL_COMMUNITY)
Admission: RE | Admit: 2019-01-27 | Discharge: 2019-01-27 | Disposition: A | Discharge status: Home / Self Care | Payer: Medicare Other | Attending: Cardiothoracic Surgery | Admitting: Cardiothoracic Surgery

## 2019-01-27 ENCOUNTER — Encounter (HOSPITAL_COMMUNITY): Payer: Self-pay | Admitting: *Deleted

## 2019-01-27 DIAGNOSIS — Z955 Presence of coronary angioplasty implant and graft: Secondary | ICD-10-CM | POA: Diagnosis not present

## 2019-01-27 DIAGNOSIS — E785 Hyperlipidemia, unspecified: Secondary | ICD-10-CM | POA: Insufficient documentation

## 2019-01-27 DIAGNOSIS — I2510 Atherosclerotic heart disease of native coronary artery without angina pectoris: Secondary | ICD-10-CM | POA: Insufficient documentation

## 2019-01-27 DIAGNOSIS — R222 Localized swelling, mass and lump, trunk: Secondary | ICD-10-CM | POA: Diagnosis not present

## 2019-01-27 DIAGNOSIS — E039 Hypothyroidism, unspecified: Secondary | ICD-10-CM | POA: Insufficient documentation

## 2019-01-27 DIAGNOSIS — Z87891 Personal history of nicotine dependence: Secondary | ICD-10-CM | POA: Insufficient documentation

## 2019-01-27 DIAGNOSIS — I252 Old myocardial infarction: Secondary | ICD-10-CM | POA: Diagnosis not present

## 2019-01-27 DIAGNOSIS — I712 Thoracic aortic aneurysm, without rupture: Secondary | ICD-10-CM | POA: Insufficient documentation

## 2019-01-27 DIAGNOSIS — K219 Gastro-esophageal reflux disease without esophagitis: Secondary | ICD-10-CM | POA: Diagnosis not present

## 2019-01-27 DIAGNOSIS — Z79899 Other long term (current) drug therapy: Secondary | ICD-10-CM | POA: Insufficient documentation

## 2019-01-27 DIAGNOSIS — I739 Peripheral vascular disease, unspecified: Secondary | ICD-10-CM | POA: Diagnosis not present

## 2019-01-27 DIAGNOSIS — M109 Gout, unspecified: Secondary | ICD-10-CM | POA: Insufficient documentation

## 2019-01-27 DIAGNOSIS — N183 Chronic kidney disease, stage 3 (moderate): Secondary | ICD-10-CM | POA: Insufficient documentation

## 2019-01-27 DIAGNOSIS — I129 Hypertensive chronic kidney disease with stage 1 through stage 4 chronic kidney disease, or unspecified chronic kidney disease: Secondary | ICD-10-CM | POA: Insufficient documentation

## 2019-01-27 DIAGNOSIS — R918 Other nonspecific abnormal finding of lung field: Secondary | ICD-10-CM | POA: Insufficient documentation

## 2019-01-27 DIAGNOSIS — J209 Acute bronchitis, unspecified: Secondary | ICD-10-CM | POA: Diagnosis not present

## 2019-01-27 DIAGNOSIS — Z419 Encounter for procedure for purposes other than remedying health state, unspecified: Secondary | ICD-10-CM

## 2019-01-27 DIAGNOSIS — R846 Abnormal cytological findings in specimens from respiratory organs and thorax: Secondary | ICD-10-CM | POA: Diagnosis not present

## 2019-01-27 HISTORY — PX: VIDEO BRONCHOSCOPY WITH ENDOBRONCHIAL ULTRASOUND: SHX6177

## 2019-01-27 HISTORY — PX: VIDEO BRONCHOSCOPY WITH ENDOBRONCHIAL NAVIGATION: SHX6175

## 2019-01-27 SURGERY — BRONCHOSCOPY, WITH EBUS
Anesthesia: General

## 2019-01-27 MED ORDER — MIDAZOLAM HCL 5 MG/5ML IJ SOLN
INTRAMUSCULAR | Status: DC | PRN
  Administered 2019-01-27: 1 mg via INTRAVENOUS

## 2019-01-27 MED ORDER — EPINEPHRINE PF 1 MG/ML IJ SOLN
INTRAMUSCULAR | Status: AC
  Filled 2019-01-27: qty 1

## 2019-01-27 MED ORDER — FENTANYL CITRATE (PF) 250 MCG/5ML IJ SOLN
INTRAMUSCULAR | Status: DC | PRN
  Administered 2019-01-27: 100 ug via INTRAVENOUS
  Administered 2019-01-27: 50 ug via INTRAVENOUS

## 2019-01-27 MED ORDER — PHENYLEPHRINE 40 MCG/ML (10ML) SYRINGE FOR IV PUSH (FOR BLOOD PRESSURE SUPPORT)
PREFILLED_SYRINGE | INTRAVENOUS | Status: DC | PRN
  Administered 2019-01-27 (×2): 80 ug via INTRAVENOUS

## 2019-01-27 MED ORDER — ROCURONIUM BROMIDE 50 MG/5ML IV SOSY
PREFILLED_SYRINGE | INTRAVENOUS | Status: AC
  Filled 2019-01-27: qty 5

## 2019-01-27 MED ORDER — 0.9 % SODIUM CHLORIDE (POUR BTL) OPTIME
TOPICAL | Status: DC | PRN
  Administered 2019-01-27: 1000 mL

## 2019-01-27 MED ORDER — ROCURONIUM BROMIDE 50 MG/5ML IV SOSY
PREFILLED_SYRINGE | INTRAVENOUS | Status: DC | PRN
  Administered 2019-01-27: 20 mg via INTRAVENOUS
  Administered 2019-01-27 (×2): 10 mg via INTRAVENOUS
  Administered 2019-01-27: 50 mg via INTRAVENOUS

## 2019-01-27 MED ORDER — EPHEDRINE 5 MG/ML INJ
INTRAVENOUS | Status: AC
  Filled 2019-01-27: qty 10

## 2019-01-27 MED ORDER — ONDANSETRON HCL 4 MG/2ML IJ SOLN
INTRAMUSCULAR | Status: AC
  Filled 2019-01-27: qty 2

## 2019-01-27 MED ORDER — FENTANYL CITRATE (PF) 250 MCG/5ML IJ SOLN
INTRAMUSCULAR | Status: AC
  Filled 2019-01-27: qty 5

## 2019-01-27 MED ORDER — EPHEDRINE SULFATE-NACL 50-0.9 MG/10ML-% IV SOSY
PREFILLED_SYRINGE | INTRAVENOUS | Status: DC | PRN
  Administered 2019-01-27 (×2): 10 mg via INTRAVENOUS

## 2019-01-27 MED ORDER — ONDANSETRON HCL 4 MG/2ML IJ SOLN
INTRAMUSCULAR | Status: DC | PRN
  Administered 2019-01-27: 4 mg via INTRAVENOUS

## 2019-01-27 MED ORDER — SODIUM CHLORIDE 0.9 % IV SOLN
INTRAVENOUS | Status: DC | PRN
  Administered 2019-01-27: 20 ug/min via INTRAVENOUS

## 2019-01-27 MED ORDER — SUGAMMADEX SODIUM 200 MG/2ML IV SOLN
INTRAVENOUS | Status: DC | PRN
  Administered 2019-01-27: 200 mg via INTRAVENOUS

## 2019-01-27 MED ORDER — DEXAMETHASONE SODIUM PHOSPHATE 10 MG/ML IJ SOLN
INTRAMUSCULAR | Status: AC
  Filled 2019-01-27: qty 1

## 2019-01-27 MED ORDER — PHENYLEPHRINE 40 MCG/ML (10ML) SYRINGE FOR IV PUSH (FOR BLOOD PRESSURE SUPPORT)
PREFILLED_SYRINGE | INTRAVENOUS | Status: AC
  Filled 2019-01-27: qty 10

## 2019-01-27 MED ORDER — MIDAZOLAM HCL 2 MG/2ML IJ SOLN
INTRAMUSCULAR | Status: AC
  Filled 2019-01-27: qty 2

## 2019-01-27 MED ORDER — LIDOCAINE 2% (20 MG/ML) 5 ML SYRINGE
INTRAMUSCULAR | Status: AC
  Filled 2019-01-27: qty 5

## 2019-01-27 MED ORDER — LACTATED RINGERS IV SOLN
INTRAVENOUS | Status: DC | PRN
  Administered 2019-01-27 (×2): via INTRAVENOUS

## 2019-01-27 MED ORDER — DEXAMETHASONE SODIUM PHOSPHATE 10 MG/ML IJ SOLN
INTRAMUSCULAR | Status: DC | PRN
  Administered 2019-01-27: 10 mg via INTRAVENOUS

## 2019-01-27 MED ORDER — PROPOFOL 10 MG/ML IV BOLUS
INTRAVENOUS | Status: AC
  Filled 2019-01-27: qty 40

## 2019-01-27 MED ORDER — ACETAMINOPHEN 500 MG PO TABS
1000.0000 mg | ORAL_TABLET | Freq: Once | ORAL | Status: AC
  Administered 2019-01-27: 1000 mg via ORAL
  Filled 2019-01-27: qty 2

## 2019-01-27 MED ORDER — SODIUM CHLORIDE 0.9 % IV SOLN: INTRAVENOUS | Status: DC

## 2019-01-27 MED ORDER — LIDOCAINE 2% (20 MG/ML) 5 ML SYRINGE
INTRAMUSCULAR | Status: DC | PRN
  Administered 2019-01-27: 100 mg via INTRAVENOUS

## 2019-01-27 MED ORDER — ROCURONIUM BROMIDE 10 MG/ML (PF) SYRINGE: PREFILLED_SYRINGE | INTRAVENOUS | Status: DC | PRN

## 2019-01-27 MED ORDER — FENTANYL CITRATE (PF) 100 MCG/2ML IJ SOLN: 25.0000 ug | INTRAMUSCULAR | Status: DC | PRN

## 2019-01-27 MED ORDER — PROPOFOL 10 MG/ML IV BOLUS
INTRAVENOUS | Status: DC | PRN
  Administered 2019-01-27: 130 mg via INTRAVENOUS

## 2019-01-27 SURGICAL SUPPLY — 50 items
ADAPTER BRONCHOSCOPE OLYMPUS (ADAPTER) ×2 IMPLANT
ADAPTER VALVE BIOPSY EBUS (MISCELLANEOUS) IMPLANT
ADPR BSCP OLMPS EDG (ADAPTER) ×1
ADPTR VALVE BIOPSY EBUS (MISCELLANEOUS)
BRUSH BIOPSY BRONCH 10 SDTNB (MISCELLANEOUS) ×1 IMPLANT
BRUSH CYTOL CELLEBRITY 1.5X140 (MISCELLANEOUS) IMPLANT
BRUSH SUPERTRAX BIOPSY (INSTRUMENTS) IMPLANT
BRUSH SUPERTRAX NDL-TIP CYTO (INSTRUMENTS) ×1 IMPLANT
CANISTER SUCT 3000ML PPV (MISCELLANEOUS) ×3 IMPLANT
CHANNEL WORK EXTEND EDGE 180 (KITS) ×1 IMPLANT
CHANNEL WORK EXTEND EDGE 90 (KITS) IMPLANT
CONT SPEC 4OZ CLIKSEAL STRL BL (MISCELLANEOUS) ×5 IMPLANT
COVER BACK TABLE 60X90IN (DRAPES) ×3 IMPLANT
COVER DOME SNAP 22 D (MISCELLANEOUS) ×2 IMPLANT
COVER WAND RF STERILE (DRAPES) ×2 IMPLANT
FILTER STRAW FLUID ASPIR (MISCELLANEOUS) IMPLANT
FORCEPS BIOP RJ4 1.8 (CUTTING FORCEPS) IMPLANT
FORCEPS BIOP SUPERTRX PREMAR (INSTRUMENTS) ×1 IMPLANT
GAUZE SPONGE 4X4 12PLY STRL (GAUZE/BANDAGES/DRESSINGS) ×3 IMPLANT
GLOVE BIO SURGEON STRL SZ 6.5 (GLOVE) ×4 IMPLANT
KIT CLEAN ENDO COMPLIANCE (KITS) ×6 IMPLANT
KIT PROCEDURE EDGE 180 (KITS) IMPLANT
KIT PROCEDURE EDGE 90 (KITS) ×1 IMPLANT
KIT TURNOVER KIT B (KITS) ×3 IMPLANT
MARKER SKIN DUAL TIP RULER LAB (MISCELLANEOUS) ×3 IMPLANT
NDL ASPIRATION VIZISHOT 19G (NEEDLE) IMPLANT
NDL ASPIRATION VIZISHOT 21G (NEEDLE) ×1 IMPLANT
NDL FILTER BLUNT 18X1 1/2 (NEEDLE) IMPLANT
NDL SUPERTRX PREMARK BIOPSY (NEEDLE) IMPLANT
NEEDLE ASPIRATION VIZISHOT 19G (NEEDLE) IMPLANT
NEEDLE ASPIRATION VIZISHOT 21G (NEEDLE) ×2 IMPLANT
NEEDLE FILTER BLUNT 18X 1/2SAF (NEEDLE)
NEEDLE FILTER BLUNT 18X1 1/2 (NEEDLE) IMPLANT
NEEDLE SUPERTRX PREMARK BIOPSY (NEEDLE) ×2 IMPLANT
NS IRRIG 1000ML POUR BTL (IV SOLUTION) ×3 IMPLANT
OIL SILICONE PENTAX (PARTS (SERVICE/REPAIRS)) ×3 IMPLANT
PAD ARMBOARD 7.5X6 YLW CONV (MISCELLANEOUS) ×6 IMPLANT
PATCHES PATIENT (LABEL) ×6 IMPLANT
SYR 20CC LL (SYRINGE) ×2 IMPLANT
SYR 20ML ECCENTRIC (SYRINGE) ×3 IMPLANT
SYR 3ML LL SCALE MARK (SYRINGE) ×2 IMPLANT
TOWEL GREEN STERILE (TOWEL DISPOSABLE) ×3 IMPLANT
TOWEL GREEN STERILE FF (TOWEL DISPOSABLE) ×3 IMPLANT
TRAP SPECIMEN MUCOUS 40CC (MISCELLANEOUS) ×3 IMPLANT
TUBE CONNECTING 20X1/4 (TUBING) ×3 IMPLANT
UNDERPAD 30X30 (UNDERPADS AND DIAPERS) ×2 IMPLANT
VALVE BIOPSY  SINGLE USE (MISCELLANEOUS) ×1
VALVE BIOPSY SINGLE USE (MISCELLANEOUS) ×2 IMPLANT
VALVE SUCTION BRONCHIO DISP (MISCELLANEOUS) ×3 IMPLANT
WATER STERILE IRR 1000ML POUR (IV SOLUTION) ×4 IMPLANT

## 2019-01-27 NOTE — Anesthesia Procedure Notes (Signed)
Procedure Name: Intubation Date/Time: 01/27/2019 7:45 AM Performed by: Harden Mo, CRNA Pre-anesthesia Checklist: Patient identified, Emergency Drugs available, Suction available and Patient being monitored Patient Re-evaluated:Patient Re-evaluated prior to induction Oxygen Delivery Method: Circle System Utilized Preoxygenation: Pre-oxygenation with 100% oxygen Induction Type: IV induction Ventilation: Mask ventilation without difficulty and Oral airway inserted - appropriate to patient size Laryngoscope Size: Sabra Heck and 2 Grade View: Grade II Tube type: Oral Tube size: 8.5 mm Number of attempts: 1 Airway Equipment and Method: Stylet and Oral airway Placement Confirmation: ETT inserted through vocal cords under direct vision,  positive ETCO2 and breath sounds checked- equal and bilateral Secured at: 23 cm Tube secured with: Tape Dental Injury: Teeth and Oropharynx as per pre-operative assessment

## 2019-01-27 NOTE — Transfer of Care (Signed)
Immediate Anesthesia Transfer of Care Note  Patient: Jeremy Johnson  Procedure(s) Performed: VIDEO BRONCHOSCOPY WITH ENDOBRONCHIAL ULTRASOUND (N/A ) VIDEO BRONCHOSCOPY WITH ENDOBRONCHIAL NAVIGATION (N/A )  Patient Location: PACU  Anesthesia Type:General  Level of Consciousness: awake, alert  and oriented  Airway & Oxygen Therapy: Patient Spontanous Breathing and Patient connected to face mask oxygen  Post-op Assessment: Report given to RN, Post -op Vital signs reviewed and stable and Patient moving all extremities X 4  Post vital signs: Reviewed and stable  Last Vitals:  Vitals Value Taken Time  BP 120/68 01/27/2019  9:59 AM  Temp    Pulse 86 01/27/2019  9:59 AM  Resp 17 01/27/2019  9:59 AM  SpO2 96 % 01/27/2019  9:59 AM  Vitals shown include unvalidated device data.  Last Pain:  Vitals:   01/27/19 0557  TempSrc: Oral  PainSc: 0-No pain      Patients Stated Pain Goal: 4 (03/35/33 1740)  Complications: No apparent anesthesia complications

## 2019-01-27 NOTE — Discharge Instructions (Signed)
Flexible Bronchoscopy, Care After This sheet gives you information about how to care for yourself after your procedure. Your health care provider may also give you more specific instructions. If you have problems or questions, contact your health care provider. What can I expect after the procedure? After the procedure, it is common to have the following symptoms for 24-48 hours:  A cough that is worse than it was before the procedure.  A low-grade fever.  A sore throat or hoarse voice.  Small streaks of blood in the mucus from your lungs (sputum), if tissue samples were removed (biopsy). Follow these instructions at home: Eating and drinking  Do not eat or drink anything (including water) for 2 hours after your procedure, or until your numbing medicine (local anesthetic) has worn off. Having a numb throat increases your risk of burning yourself or choking.  After your numbness is gone and your cough and gag reflexes have returned, you may start eating only soft foods and slowly drinking liquids.  The day after the procedure, return to your normal diet. Driving  Do not drive for 24 hours if you were given a medicine to help you relax (sedative).  Do not drive or use heavy machinery while taking prescription pain medicine. General instructions   Take over-the-counter and prescription medicines only as told by your health care provider.  Return to your normal activities as told by your health care provider. Ask your health care provider what activities are safe for you.  Do not use any products that contain nicotine or tobacco, such as cigarettes and e-cigarettes. If you need help quitting, ask your health care provider.  Keep all follow-up visits as told by your health care provider. This is important, especially if you had a biopsy taken. Get help right away if:  You have shortness of breath that gets worse.  You become light-headed or feel like you might faint.  You have  chest pain.  You cough up more than a small amount of blood.  The amount of blood you cough up increases. Summary  Common symptoms in the 24-48 hours following a flexible bronchoscopy include cough, low-grade fever, sore throat or hoarse voice, and blood-streaked mucus from the lungs (if you had a biopsy).  Do not eat or drink anything (including water) for 2 hours after your procedure, or until your local anesthetic has worn off. You can return to your normal diet the day after the procedure.  Get help right away if you develop worsening shortness of breath, have chest pain, become light-headed, or cough up more than a small amount of blood. This information is not intended to replace advice given to you by your health care provider. Make sure you discuss any questions you have with your health care provider. Document Released: 05/19/2005 Document Revised: 11/17/2016 Document Reviewed: 11/17/2016 Elsevier Interactive Patient Education  2019 [employer].

## 2019-01-27 NOTE — H&P (Signed)
[address]       [place] [address]             [id number]                    Jeremy Johnson Date of Birth: Jun 12, 1946 Referring: [name], MD Primary Care: [name], MD Cardiology: No primary care provider on file.  Chief Complaint:        Chief Complaint  Patient presents with   Lung Mass    2 week f/u review PET Scan and PFT's   Thoracic Aortic Aneurysm     History of Present Illness:    Jeremy Johnson 73 y.o. male is seen in the office for  evaluation evaluation of dilation of his aortic  arch.  Patienthad  follow-up CTA of the chest.  Since last seen patient notes an episode of pneumonia with cough and sputum production in the fall 2019, this is resolved.  He denies any hemoptysis.  He continues to refrain from smoking.  Patient has known chronic renal insufficiency.   Patient had a abdominal aneurysm repaired in 2006 by Dr. Amedeo Plenty.  Because of his long smoking history Dr. Willey Blade had recommended that he have a lung cancer screening CT this was done in October 2018.  A lung cancer screening CT performed in October 28 suggested a dilated aortic arch and the patient was referred for further evaluation.   The patient does not have any family history of aortic dissection.  He did have an uncle who died suddenly in his 41s while in the [place] but with no details his mother at age 63 had a brain aneurysm bleed that was coiled she subsequently died at age 81 with dementia. [name]  History of CAD followed by Dr Lia Foyer, last seen by cardiology 02/09/2010  Due to increasing mass in left lower lobe PET scan done last week   Current Activity/ Functional Status:  Patient is independent with mobility/ambulation, transfers, ADL's, IADL's.   Zubrod Score: At the time of surgery this patients most appropriate activity status/level should be described as: []     0    Normal activity, no symptoms [x]     1    Restricted  in physical strenuous activity but ambulatory, able to do out light work []     2    Ambulatory and capable of self care, unable to do work activities, up and about               >50 % of waking hours                              []     3    Only limited self care, in bed greater than 50% of waking hours []     4    Completely disabled, no self care, confined to bed or chair []     5    Moribund   Past Medical History:  Diagnosis Date   CAD (coronary artery disease)    STENT... MID CIRCUMFLEX...1997   Chronic kidney disease    STAGE 3   Degenerative joint disease (DJD) of lumbar spine    GERD (gastroesophageal reflux disease)    Gout    Hyperlipidemia    Hypertension    Hypothyroidism    Incisional hernia    Leukocytosis    CHRONIC MILD  Myocardial infarction ([place])    1997      Family History  Problem Relation Age of Onset   Stroke Brother     Social History   Socioeconomic History   Marital status: Married    Spouse name: Not on file   Number of children: Not on file   Years of education: Not on file   Highest education level: Not on file  Occupational History   Not on file  Social Needs   Financial resource strain: Not on file   Food insecurity:    Worry: Not on file    Inability: Not on file   Transportation needs:    Medical: Not on file    Non-medical: Not on file  Tobacco Use   Smoking status: Former Smoker    Packs/day: 0.50    Years: 54.00    Pack years: 27.00    Last attempt to quit: 09/17/2017    Years since quitting: 1.3   Smokeless tobacco: Never Used  Substance and Sexual Activity   Alcohol use: Yes    Comment: occasional   Drug use: No   Sexual activity: Not on file  Lifestyle   Physical activity:    Days per week: Not on file    Minutes per session: Not on file   Stress: Not on file  Relationships   Social connections:    Talks on phone: Not on file    Gets together: Not on file    Attends religious  service: Not on file    Active member of club or organization: Not on file    Attends meetings of clubs or organizations: Not on file    Relationship status: Not on file   Intimate partner violence:    Fear of current or ex partner: Not on file    Emotionally abused: Not on file    Physically abused: Not on file    Forced sexual activity: Not on file  Other Topics Concern   Not on file  Social History Narrative   Not on file    Social History   Tobacco Use  Smoking Status Former Smoker   Packs/day: 0.50   Years: 54.00   Pack years: 27.00   Last attempt to quit: 09/17/2017   Years since quitting: 1.3  Smokeless Tobacco Never Used    Social History   Substance and Sexual Activity  Alcohol Use Yes   Comment: occasional     Allergies  Allergen Reactions   Penicillins Rash and Other (See Comments)    Has patient had a PCN reaction causing immediate rash, facial/tongue/throat swelling, SOB or lightheadedness with hypotension: No Has patient had a PCN reaction causing severe rash involving mucus membranes or skin necrosis: No Has patient had a PCN reaction that required hospitalization: No Has patient had a PCN reaction occurring within the last 10 years: No If all of the above answers are "NO", then may proceed with Cephalosporin use.     Current Facility-Administered Medications  Medication Dose Route Frequency Provider Last Rate Last Dose   0.9 %  sodium chloride infusion   Intravenous Continuous Woodrum, Chelsey L, MD       Facility-Administered Medications Ordered in Other Encounters  Medication Dose Route Frequency Provider Last Rate Last Dose   lactated ringers infusion    Continuous PRN Harden Mo, CRNA        Pertinent items are noted in HPI.   Review of Systems:   Review of Systems  Constitutional: Negative.   HENT: Negative.   Eyes: Negative.   Respiratory: Positive for cough. Negative for hemoptysis, sputum production, shortness of breath  and wheezing.   Cardiovascular: Negative.   Gastrointestinal: Negative.   Genitourinary: Negative.   Musculoskeletal: Negative.   Skin: Negative.   Neurological: Negative.   Endo/Heme/Allergies: Negative.   Psychiatric/Behavioral: Negative.      Physical Exam: BP (!) 148/79    Pulse (!) 59    Temp 98 F (36.7 C) (Oral)    Ht 5\' 9"  (1.753 m)    Wt 90.6 kg    SpO2 99%    BMI 29.49 kg/m   PHYSICAL EXAMINATION: General appearance: alert and cooperative Head: Normocephalic, without obvious abnormality, atraumatic Neck: no adenopathy, no carotid bruit, no JVD, supple, symmetrical, trachea midline and thyroid not enlarged, symmetric, no tenderness/mass/nodules Lymph nodes: Cervical, supraclavicular, and axillary nodes normal. Resp: clear to auscultation bilaterally Back: symmetric, no curvature. ROM normal. No CVA tenderness. Cardio: regular rate and rhythm, S1, S2 normal, no murmur, click, rub or gallop GI: soft, non-tender; bowel sounds normal; no masses,  no organomegaly Extremities: extremities normal, atraumatic, no cyanosis or edema and Homans sign is negative, no sign of DVT Neurologic: Grossly normal  Patient has palpable DP and PT pulses bilaterally palpable brachial and radial pulses bilaterally He has a 5 cm incisional hernia from his previous abdominal aortic aneurysm repair but without evidence of incarceration  Diagnostic Studies & Laboratory data:     Recent Radiology Findings: Nm Pet Image Initial (pi) Skull Base To Thigh  Result Date: 01/22/2019 CLINICAL DATA:  Initial treatment strategy for lung mass. EXAM: NUCLEAR MEDICINE PET SKULL BASE TO THIGH TECHNIQUE: 11.0 mCi F-18 FDG was injected intravenously. Full-ring PET imaging was performed from the skull base to thigh after the radiotracer. CT data was obtained and used for attenuation correction and anatomic localization. Fasting blood glucose: 134 mg/dl COMPARISON:  01/09/2019 FINDINGS: Mediastinal blood pool activity:  SUV max 3.09 NECK: No hypermetabolic lymph nodes in the neck. Incidental CT findings: none CHEST: No hypermetabolic supraclavicular or axillary lymph nodes. Mild to moderate uptake associated with low left paratracheal lymph nodes with SUV max of 3.59. There is a left hilar lymph node with intense uptake within SUV max of 15.66. The mass within the central left lung which crosses the oblique fissure and involves the posterior left upper lobe and superior segment of left lower lobe is again noted. This exhibits intense radiotracer uptake within SUV max of 14.27. No additional hypermetabolic nodules or masses identified. Small perifissural nodules with in the minor fissure of the right lung are less than 1 cm and too small to characterize. No additional hypermetabolic pulmonary nodules or masses noted. Incidental CT findings: Thoracic aortic aneurysm and aortic atherosclerosis. Coronary artery calcifications. Moderate to advanced changes of emphysema. ABDOMEN/PELVIS: No abnormal hypermetabolic activity within the liver, pancreas, adrenal glands, or spleen. No hypermetabolic lymph nodes in the abdomen or pelvis. Incidental CT findings: Extensive aortic atherosclerosis. The infrarenal abdominal aorta measures 3.3 cm in maximum AP dimension. Right kidney cysts noted. Bilateral fat containing inguinal hernias. SKELETON: No focal hypermetabolic activity to suggest skeletal metastasis. Incidental CT findings: none IMPRESSION: 1. Chest the central left perihilar lung mass which crosses the oblique fissure is again noted. This exhibits intense radiotracer uptake within SUV max of 14.2. Hypermetabolic left hilar lymph node is also noted. Mild to moderate increased uptake identified within low left paratracheal lymph nodes, equivocal for metastatic adenopathy. No hypermetabolic contralateral mediastinal, subcarinal  or hilar lymph nodes and no evidence for distant metastatic disease. 2. Aortic atherosclerosis with thoracic and  infrarenal abdominal aortic aneurysm. Electronically Signed   By: Kerby Moors M.D.   On: 01/22/2019 14:11   Ct Chest Wo Contrast  Result Date: 01/09/2019 CLINICAL DATA:  Followup thoracic aortic aneurysm. EXAM: CT CHEST WITHOUT CONTRAST TECHNIQUE: Multidetector CT imaging of the chest was performed following the standard protocol without IV contrast. COMPARISON:  5/21/9 FINDINGS: Cardiovascular: Stable appearance of ascending thoracic aortic ectasia measuring 3.9 cm, image 51/4. The transverse thoracic aorta is focally dilated measuring 4.5 cm on the current exam, image 107/6. Previously this measured 4.5 cm. The descending thoracic aorta is diffusely ectopic as well with a focal area of increased caliber measuring 3.4 cm, image 107/6. Unchanged. Mediastinum/Nodes: No axillary or supraclavicular adenopathy Normal appearance of the thyroid gland. The trachea appears patent and is midline. Normal appearance of the esophagus. Low left paratracheal lymph node measures 7 mm, image 47/11. Previously 6 mm. No right paratracheal or subcarinal adenopathy identified. Lungs/Pleura: No pleural effusion. There are advanced smoking related changes throughout both lungs including emphysema and diffuse bronchial wall thickening. There is a mass within the central left lung which crosses the oblique fissure and involves the posterior left upper lobe and superior segment of left lower lobe. On today's study this lesion is clearly identified measuring 2.2 x 3.2 by 4.0 cm. In retrospect this lesion was difficult to distinguish from the adjacent descending thoracic aorta due to lack of IV contrast material at this time this measured 1.0 x 1.5 by 1.8 cm. Highly concerning for primary bronchogenic carcinoma. Small nonspecific nodule in the left upper lobe measures 4 mm, image 40/8. New from previous exam. Stable nodule along the minor fissure of the right lung measuring 8 mm, image 89/8. New right middle lobe lung nodule measures 6  mm, image 96/8. Also new is a small nodule in the posterior right base measuring 4 mm. Upper Abdomen: No acute abnormality identified. Small left adrenal gland nodule measures 0.9 cm, image 138/11. Unchanged. Musculoskeletal: No aggressive lytic or sclerotic bone lesions. IMPRESSION: 1. Extensive aortic atherosclerosis with focal aneurysmal dilatation of the transverse aortic arch measures 4.5 cm. This is not significantly changed in the interval. Recommend semi-annual imaging followup by CTA or MRA and referral to cardiothoracic surgery if not already obtained. This recommendation follows 2010 ACCF/AHA/AATS/ACR/ASA/SCA/SCAI/SIR/STS/SVM Guidelines for the Diagnosis and Management of Patients With Thoracic Aortic Disease. Circulation. 2010; 121: Q761-P50. Aortic aneurysm NOS (ICD10-I71.9) 2. There is a enlarging mass within the central left lung which crosses the oblique fracture involving the posterior left upper lobe and superior segment of left lower lobe. Highly concerning for primary bronchogenic carcinoma. Further investigation with PET-CT and tissue sampling is advised. 3. There are several new small nonspecific nodules as described above. Attention on follow-up imaging is advised. 4. Diffuse bronchial wall thickening with emphysema, as above; imaging findings suggestive of underlying COPD. 5. Multi vessel coronary artery atherosclerotic calcifications. 6. These results will be called to the ordering clinician or representative by the Radiologist Assistant, and communication documented in the PACS or zVision Dashboard. Electronically Signed   By: Kerby Moors M.D.   On: 01/09/2019 14:27    Ct Chest Wo Contrast  Result Date: 04/02/2018 CLINICAL DATA:  Thoracic aortic aneurysm without rupture. EXAM: CT CHEST WITHOUT CONTRAST TECHNIQUE: Multidetector CT imaging of the chest was performed following the standard protocol without IV contrast. COMPARISON:  CT scan of August 14, 2017. FINDINGS:  Cardiovascular:  Focal aneurysmal bulge measuring 4.6 cm is noted in the anterior portion of the transverse aortic arch. Atherosclerosis of thoracic aorta is noted. Coronary artery calcifications are noted. No pericardial effusion is noted. Normal cardiac size. Mediastinum/Nodes: No enlarged mediastinal or axillary lymph nodes. Thyroid gland, trachea, and esophagus demonstrate no significant findings. Lungs/Pleura: No pneumothorax or pleural effusion is noted. Emphysematous disease is noted in both upper lobes. No acute pulmonary disease is noted. Upper Abdomen: Moderate ventral hernia is noted in epigastric region. Musculoskeletal: No chest wall mass or suspicious bone lesions identified. IMPRESSION: Stable focal aneurysmal bulge is seen involving the anterior portion of transverse aortic arch measuring 4.6 cm. Ascending thoracic aortic aneurysm. Recommend semi-annual imaging followup by CTA or MRA and referral to cardiothoracic surgery if not already obtained. This recommendation follows 2010 ACCF/AHA/AATS/ACR/ASA/SCA/SCAI/SIR/STS/SVM Guidelines for the Diagnosis and Management of Patients With Thoracic Aortic Disease. Circulation. 2010; 121: D357-S177. Coronary artery calcifications are noted. Moderate ventral hernia is noted in epigastric region. Aortic Atherosclerosis (ICD10-I70.0) and Emphysema (ICD10-J43.9). Electronically Signed   By: Marijo Conception, M.D.   On: 04/02/2018 13:02     Ct Chest Lung Ca Screen Low Dose W/o Cm  Result Date: 08/14/2017 CLINICAL DATA:  Current asymptomatic smoker. Forty pack-year history. EXAM: CT CHEST WITHOUT CONTRAST LOW-DOSE FOR LUNG CANCER SCREENING TECHNIQUE: Multidetector CT imaging of the chest was performed following the standard protocol without IV contrast. COMPARISON:  None. FINDINGS: Cardiovascular: The heart size is normal. Aortic atherosclerosis identified. Aneurysmal dilatation of the anterior arch measures 4.7 cm, image 21 of series 2. Calcifications within the left main  coronary artery, LAD, left circumflex noted. No pericardial effusion. Mediastinum/Nodes: No enlarged mediastinal, hilar, or axillary lymph nodes. Thyroid gland, trachea, and esophagus demonstrate no significant findings. Lungs/Pleura: Moderate to advanced changes of centrilobular emphysema. Scattered nodules are identified the largest is in the right middle lobe with an equivalent diameter and 6.4 mm. Upper Abdomen: No acute abnormality. Musculoskeletal: Degenerative disc disease noted within the thoracic spine. No aggressive lytic or sclerotic bone lesions. IMPRESSION: 1. Lung-RADS 2, benign appearance or behavior. Continue annual screening with low-dose chest CT without contrast in 12 months. 2. Aortic Atherosclerosis (ICD10-I70.0) and Emphysema (ICD10-J43.9). 3. Aortic aneurysm NOS (ICD10-I71.9). Aneurysmal dilatation of the anterior arch of the thoracic aorta is identified. Recommend semi-annual imaging followup by CTA or MRA and referral to cardiothoracic surgery if not already obtained. This recommendation follows 2010 ACCF/AHA/AATS/ACR/ASA/SCA/SCAI/SIR/STS/SVM Guidelines for the Diagnosis and Management of Patients With Thoracic Aortic Disease. Circulation. 2010; 121: [phone] Electronically Signed   By: Kerby Moors M.D.   On: 08/14/2017 16:00    I have independently reviewed the above radiology studies  and reviewed the findings with the patient.     Recent Lab Findings: Lab Results  Component Value Date   WBC 10.3 01/24/2019   HGB 12.6 (L) 01/24/2019   HCT 37.8 (L) 01/24/2019   PLT 282 01/24/2019   GLUCOSE 117 (H) 01/24/2019   ALT 15 01/24/2019   AST 20 01/24/2019   NA 138 01/24/2019   K 3.8 01/24/2019   CL 107 01/24/2019   CREATININE 2.40 (H) 01/24/2019   BUN 30 (H) 01/24/2019   CO2 21 (L) 01/24/2019   INR 1.0 01/24/2019   Chronic Kidney Disease   Stage I     GFR >90  Stage II    GFR 60-89  Stage IIIA GFR 45-59  Stage IIIB GFR 30-44  Stage IV   GFR 15-29  Stage V  GFR   <15  Lab Results  Component Value Date   CREATININE 2.40 (H) 01/24/2019   Estimated Creatinine Clearance: 31 mL/min (A) (by C-G formula based on SCr of 2.4 mg/dL (H)).   PFT' FEV1 2.39  79% DLCO 17.14 70%                             Assessment / Plan:   1/Stable focal aneurysmal bulge is seen involving the anterior portion of transverse aortic arch measuring 4.6 cm. Ascending thoracic aortic aneurysm 2/Stage IIIb chronic renal failure #3 calcification of the left main coronary artery LAD and circumflex history of myocardial infarction 1999 with stent placement-last seen by cardiology in 2011 #4 moderate to advanced changes of central lobar emphysema on chest CT #5 There is a enlarging mass within the central left lung which crosses the oblique fracture involving the posterior left upper lobe and superior segment of left lower lobe. Highly concerning for primary bronchogenic carcinoma.  This mass is adjacent to the aorta and approximately 2 cm in size.  I have reviewed with the patient and his wife the CT findings and the concern of the enlarging mass in the left lower lobe abutting the descending thoracic aorta.  The patient did not receive contrast with the CT of the chest, but the area in question appears adjacent to the aorta but not actually part of the aorta.  I discussed with he and his wife that this may represent carcinoma of the lung.   Patient's findings are presented in multidisciplinary thoracic oncology conference this morning including reviewing the PET scan done yesterday.  I recommend to the patient that we proceed with bronchoscopy ebus and navigation bronchoscopy  try to obtain a tissue diagnosis and possible cyst determine nodal involvement.  We will then have the patient return to the multidisciplinary thoracic oncology clinic to review the findings and make a decision on multimodality treatment if biopsy-proven carcinoma of the lung. The goals risks and alternatives of the  planned surgical procedure Procedure(s): VIDEO BRONCHOSCOPY WITH ENDOBRONCHIAL ULTRASOUND (N/A) VIDEO BRONCHOSCOPY WITH ENDOBRONCHIAL NAVIGATION (N/A)  have been discussed with the patient in detail. The risks of the procedure including death, infection, stroke, myocardial infarction, bleeding, blood transfusion have all been discussed specifically.  I have quoted Jeremy Johnson a 1 % of perioperative mortality and a complication rate as high as 10 %. The patient's questions have been answered.Jeremy Johnson is willing  to proceed with the planned procedure.   Grace Isaac MD      [address] [place] [address] [place]   Beeper [phone]  01/27/2019 7:06 AM

## 2019-01-27 NOTE — Op Note (Signed)
      [address]       [address],[place] [address]             [phone]    01/27/2019  10:09 AM  PATIENT:  Jeremy Johnson  73 y.o. male  PRE-OPERATIVE DIAGNOSIS:  LUNG MASS  POST-OPERATIVE DIAGNOSIS:  LUNG MASS  PROCEDURE:  Procedure(s): VIDEO BRONCHOSCOPY WITH ENDOBRONCHIAL ULTRASOUND (N/A) VIDEO BRONCHOSCOPY WITH ENDOBRONCHIAL NAVIGATION (N/A) With transbronchial bx of 10 l node and left lower lung mass  SURGEON:  Surgeon(s) and Role:    * Grace Isaac, MD - Primary   ANESTHESIA:   general  EBL:  none  BLOOD ADMINISTERED:none  DRAINS: none   LOCAL MEDICATIONS USED:  NONE  SPECIMEN:  No Specimen and Source of Specimen:  10l nodes and left lower lobe lung mass  DISPOSITION OF SPECIMEN:  PATHOLOGY  COUNTS:  YES  DICTATION: .Dragon Dictation  PLAN OF CARE: Discharge to home after PACU  PATIENT DISPOSITION:  PACU - hemodynamically stable.   Delay start of Pharmacological VTE agent (>24hrs) due to surgical blood loss or risk of bleeding: yes

## 2019-01-28 ENCOUNTER — Encounter (HOSPITAL_COMMUNITY): Payer: Self-pay | Admitting: Cardiothoracic Surgery

## 2019-01-28 NOTE — Anesthesia Postprocedure Evaluation (Signed)
Anesthesia Post Note  Patient: Jeremy Johnson  Procedure(s) Performed: VIDEO BRONCHOSCOPY WITH ENDOBRONCHIAL ULTRASOUND (N/A ) VIDEO BRONCHOSCOPY WITH ENDOBRONCHIAL NAVIGATION (N/A )     Patient location during evaluation: PACU Anesthesia Type: General Level of consciousness: awake and alert Pain management: pain level controlled Vital Signs Assessment: post-procedure vital signs reviewed and stable Respiratory status: spontaneous breathing, nonlabored ventilation, respiratory function stable and patient connected to nasal cannula oxygen Cardiovascular status: blood pressure returned to baseline and stable Postop Assessment: no apparent nausea or vomiting Anesthetic complications: no    Last Vitals:  Vitals:   01/27/19 1015 01/27/19 1030  BP: 117/74 116/74  Pulse: 91 90  Resp: 15 16  Temp:  36.5 C  SpO2: 94% 94%    Last Pain:  Vitals:   01/27/19 1030  TempSrc:   PainSc: 0-No pain                 Chelsey L Woodrum

## 2019-01-30 ENCOUNTER — Other Ambulatory Visit: Payer: Medicare Other

## 2019-01-30 ENCOUNTER — Telehealth: Payer: Self-pay | Admitting: Cardiothoracic Surgery

## 2019-01-30 NOTE — Telephone Encounter (Addendum)
[address]       [place] [address]             [phone]      Jeremy Johnson [place] Medical Record #[id number] Date of Birth: August 25, 1946  Referring: No ref. provider found Primary Care: [name], MD Primary Cardiologist: No primary care provider on file.   Chief Complaint:   POST OP FOLLOW UP 01/27/2019 PROCEDURE:  Procedure(s): VIDEO BRONCHOSCOPY WITH ENDOBRONCHIAL ULTRASOUND (N/A) VIDEO BRONCHOSCOPY WITH ENDOBRONCHIAL NAVIGATION (N/A) With transbronchial bx of 10 l node and left lower lung mass History of Present Illness:     Patient had bronchoscopy with endobronchial ultrasound and navigation biopsy of left lower lobe lung lesion, he was called on the phone today said no difficulty since surgery.     Past Medical History:  Diagnosis Date  . CAD (coronary artery disease)    STENT... MID CIRCUMFLEX...1997  . Chronic kidney disease    STAGE 3  . Degenerative joint disease (DJD) of lumbar spine   . GERD (gastroesophageal reflux disease)   . Gout   . Hyperlipidemia   . Hypertension   . Hypothyroidism   . Incisional hernia   . Leukocytosis    CHRONIC MILD  . Myocardial infarction [facility])    1997     Social History   Tobacco Use  Smoking Status Former Smoker  . Packs/day: 0.50  . Years: 54.00  . Pack years: 27.00  . Last attempt to quit: 09/17/2017  . Years since quitting: 1.3  Smokeless Tobacco Never Used    Social History   Substance and Sexual Activity  Alcohol Use Yes   Comment: occasional     Allergies  Allergen Reactions  . Penicillins Rash and Other (See Comments)    Has patient had a PCN reaction causing immediate rash, facial/tongue/throat swelling, SOB or lightheadedness with hypotension: No Has patient had a PCN reaction causing severe rash involving mucus membranes or skin necrosis: No Has patient had a PCN reaction that required hospitalization: No Has patient had a PCN reaction occurring within the last  10 years: No If all of the above answers are "NO", then may proceed with Cephalosporin use.     Current Outpatient Medications  Medication Sig Dispense Refill  . allopurinol (ZYLOPRIM) 300 MG tablet Take 300 mg by mouth daily.    [name] amLODipine (NORVASC) 5 MG tablet Take 5 mg by mouth daily.     [name] aspirin EC 81 MG tablet Take 81 mg by mouth daily.    . calcitRIOL (ROCALTROL) 0.25 MCG capsule Take 0.25 mcg by mouth daily.     [name] levothyroxine (SYNTHROID, LEVOTHROID) 175 MCG tablet Take 175 mcg by mouth daily before breakfast.    . losartan-hydrochlorothiazide (HYZAAR) 100-25 MG tablet Take 1 tablet by mouth daily.    . metoprolol tartrate (LOPRESSOR) 50 MG tablet Take 25 mg by mouth 2 (two) times daily.     . mometasone (ELOCON) 0.1 % ointment Apply 1 application topically 2 (two) times daily as needed (for eczema).   2  . omeprazole (PRILOSEC) 20 MG capsule Take 20 mg by mouth daily.    . simvastatin (ZOCOR) 20 MG tablet Take 20 mg by mouth daily.      No current facility-administered medications for this visit.    Path:  Diagnosis Lung, biopsy, left lower lobe mass - ATYPICAL CELLULAR PROLIFERATION PRESENT - SEE COMMENT Microscopic Comment The biopsies are small with crush artifact; however,  there is small round blue cell proliferation present. Given the very limited material a small immunohistochemistry panel was performed (CD56, chromogranin and synaptophysin). The atypical cells are positive for CD56 but negative for chromogranin and synaptophysin. The overall features are worrisome for malignancy, specifically small cell carcinoma. Dr. Jeannie Done reviewed the case and agrees with the above diagnosis. DAWN BUTLER MD   Recent Lab Findings: Lab Results  Component Value Date   WBC 10.3 01/24/2019   HGB 12.6 (L) 01/24/2019   HCT 37.8 (L) 01/24/2019   PLT 282 01/24/2019   GLUCOSE 117 (H) 01/24/2019   ALT 15 01/24/2019   AST 20 01/24/2019   NA 138 01/24/2019   K 3.8 01/24/2019    CL 107 01/24/2019   CREATININE 2.40 (H) 01/24/2019   BUN 30 (H) 01/24/2019   CO2 21 (L) 01/24/2019   INR 1.0 01/24/2019      Assessment / Plan:   Left lower lobe lung mass with left hilar involvement in patient with chronic renal insufficiency-from the limited pathologic tissue appears most consistent with small cell lung cancer  We will have the patient seen in the multidisciplinary thoracic oncology conference and clinic and review case with medical oncology to arrive at a consensus of treatment  I have called the patient on the phone and interviewed him and reviewed the pathology findings with him.  He is expecting a call from the medical oncology nurse navigator to arrange a appointment next week to see Dr. Earlie Server.       Grace Isaac MD      [address] [place] [address] Office [phone]   Beeper [phone]  01/30/2019 12:46 PM

## 2019-02-03 ENCOUNTER — Telehealth: Payer: Self-pay | Admitting: *Deleted

## 2019-02-03 DIAGNOSIS — R918 Other nonspecific abnormal finding of lung field: Secondary | ICD-10-CM

## 2019-02-03 NOTE — Telephone Encounter (Signed)
Oncology Nurse Navigator Documentation  Oncology Nurse Navigator Flowsheets 02/03/2019  Navigator Location [facility]  Referral date to RadOnc/MedOnc 02/03/2019  Navigator Encounter Type Telephone/I received a message from [facility] about Jeremy Johnson appt.  My understanding that patient was going to be presented at cancer conference this week.  I read Dr. Everrett Coombe last not and he would like patient to be set up with Dr. Julien Nordmann.  I called patient and scheduled them to be seen this week.  He verbalized understanding of appt time and place.   Telephone Outgoing Call  Abnormal Finding Date 01/09/2019  Confirmed Diagnosis Date 01/27/2019  Treatment Phase Pre-Tx/Tx Discussion  Barriers/Navigation Needs Education;Coordination of Care  Education Other  Interventions Coordination of Care;Education  Coordination of Care Appts  Education Method Verbal  Acuity Level 2  Time Spent with Patient 30

## 2019-02-05 NOTE — Op Note (Signed)
NAME: Jeremy Johnson, [place] MEDICAL RECORD IH:[id number] ACCOUNT [id number] DATE OF BIRTH:[birth year] FACILITY: [facility] LOCATION: [facility] PHYSICIAN:Jaleiyah Alas Maryruth Bun, MD  OPERATIVE REPORT  DATE OF PROCEDURE:  01/27/2019  PREOPERATIVE DIAGNOSIS:  Left lower lobe lung mass.  POSTOPERATIVE DIAGNOSIS:  Left lower lobe lung mass.  SURGICAL PROCEDURES:  Video bronchoscopy with endobronchial ultrasound and transbronchial biopsy.  Also with biopsy of left lower lung mass with navigation bronchoscopy.  SURGEON:  Lanelle Bal, MD  BRIEF HISTORY:  The patient is a 73 year old male who was being followed for mildly dilated ascending aorta.  Over the past 2 scans, an area in the left lower lobe adjacent to the descending thoracic aorta became visible and had enlarged in size over 8  months.  Because of this change, we then performed a PET scan which showed area of hypermetabolic activity adjacent to the aorta in the left lower lobe and also evidence of left hilar adenopathy that was hypermetabolic.  It was recommended to the patient  that we attempt a biopsy.  It was felt the most appropriate to try to biopsy this with a EUS and ENB.  Risks and options were discussed with the patient who was agreeable and signed informed consent.  DESCRIPTION OF PROCEDURE:  The patient underwent general endotracheal anesthesia without incident.  Appropriate timeout was performed and through the single lumen endotracheal tube, a 2.8 mm fiberoptic video bronchoscope was passed through the  endotracheal tube.  The right and left tracheobronchial tree was examined carefully to the subsegmental level bilaterally.  No definitive endobronchial lesion was noted.  The video bronchoscopy was then removed and an endobronchial ultrasound scope was  then used.  With this, we were able to localize the area of nodal tissue corresponding to 10L lymph node.  This seemed to correlate with the area of hypermetabolic activity on the scan.   Several passes with the transbronchial aspiration with a 21  aspirating needle was then performed.  Initial smears of this showed nodal tissue.  We then proceeded to electromagnetic navigation bronchoscopy.   A standard bronchoscope was placed with a working channel and sensor placed and through the scope and  positioned.  The previously created plan was loaded into the Super D software system.  Appropriate registration of the plan to the patient was created.  We then proceeded with an initially 180 degree working channel and after multiple attempts, had   difficulty navigating satisfactorily to the lesion.  We changed the working channel to 180 degree working channel and were able to navigate to the mass more precisely.  Within 2 cm of the mass, we then used fluoroscopic guidance to correct the alignment  through the Super D system.  This gave [place] good position of the working channel.  LG was removed and we proceeded with taking biopsies first with needle aspiration,  needle brush, triple brush and biopsy forceps.  In between different passes,  the LG  probe was passed to confirm our location and the biopsies were done under fluoroscopic guidance.  The initial smears of this showed diagnostic material.  The remaining tissue was sent for permanent.  Additional biopsies with a small biopsy forceps were  also performed and sent to pathology.    At the completion of our obtaining what appeared to be adequate tissue, we then suctioned  He was then extubated in the operating room, transferred to the recovery room, having tolerated the procedure without obvious complication.  AN/NUANCE  D:02/05/2019 T:02/05/2019 JOB:[id number]

## 2019-02-06 ENCOUNTER — Inpatient Hospital Stay (HOSPITAL_BASED_OUTPATIENT_CLINIC_OR_DEPARTMENT_OTHER): Payer: Medicare Other | Admitting: Internal Medicine

## 2019-02-06 ENCOUNTER — Inpatient Hospital Stay: Payer: Medicare Other | Attending: Internal Medicine

## 2019-02-06 ENCOUNTER — Telehealth: Payer: Self-pay | Admitting: Internal Medicine

## 2019-02-06 ENCOUNTER — Other Ambulatory Visit: Payer: Self-pay

## 2019-02-06 ENCOUNTER — Other Ambulatory Visit: Payer: Self-pay | Admitting: *Deleted

## 2019-02-06 ENCOUNTER — Encounter: Payer: Self-pay | Admitting: Internal Medicine

## 2019-02-06 DIAGNOSIS — C3490 Malignant neoplasm of unspecified part of unspecified bronchus or lung: Secondary | ICD-10-CM

## 2019-02-06 DIAGNOSIS — N183 Chronic kidney disease, stage 3 (moderate): Secondary | ICD-10-CM | POA: Insufficient documentation

## 2019-02-06 DIAGNOSIS — C3411 Malignant neoplasm of upper lobe, right bronchus or lung: Secondary | ICD-10-CM

## 2019-02-06 DIAGNOSIS — Z7189 Other specified counseling: Secondary | ICD-10-CM | POA: Insufficient documentation

## 2019-02-06 DIAGNOSIS — Z5111 Encounter for antineoplastic chemotherapy: Secondary | ICD-10-CM

## 2019-02-06 DIAGNOSIS — C3431 Malignant neoplasm of lower lobe, right bronchus or lung: Secondary | ICD-10-CM | POA: Diagnosis not present

## 2019-02-06 DIAGNOSIS — I252 Old myocardial infarction: Secondary | ICD-10-CM | POA: Diagnosis not present

## 2019-02-06 DIAGNOSIS — E785 Hyperlipidemia, unspecified: Secondary | ICD-10-CM

## 2019-02-06 DIAGNOSIS — R5383 Other fatigue: Secondary | ICD-10-CM

## 2019-02-06 DIAGNOSIS — R590 Localized enlarged lymph nodes: Secondary | ICD-10-CM | POA: Insufficient documentation

## 2019-02-06 DIAGNOSIS — Z87891 Personal history of nicotine dependence: Secondary | ICD-10-CM

## 2019-02-06 DIAGNOSIS — Z7982 Long term (current) use of aspirin: Secondary | ICD-10-CM

## 2019-02-06 DIAGNOSIS — I129 Hypertensive chronic kidney disease with stage 1 through stage 4 chronic kidney disease, or unspecified chronic kidney disease: Secondary | ICD-10-CM

## 2019-02-06 DIAGNOSIS — K219 Gastro-esophageal reflux disease without esophagitis: Secondary | ICD-10-CM | POA: Diagnosis not present

## 2019-02-06 DIAGNOSIS — M109 Gout, unspecified: Secondary | ICD-10-CM

## 2019-02-06 DIAGNOSIS — E039 Hypothyroidism, unspecified: Secondary | ICD-10-CM | POA: Insufficient documentation

## 2019-02-06 DIAGNOSIS — Z8249 Family history of ischemic heart disease and other diseases of the circulatory system: Secondary | ICD-10-CM | POA: Diagnosis not present

## 2019-02-06 DIAGNOSIS — Z79899 Other long term (current) drug therapy: Secondary | ICD-10-CM | POA: Diagnosis not present

## 2019-02-06 DIAGNOSIS — R918 Other nonspecific abnormal finding of lung field: Secondary | ICD-10-CM

## 2019-02-06 DIAGNOSIS — C3492 Malignant neoplasm of unspecified part of left bronchus or lung: Secondary | ICD-10-CM

## 2019-02-06 LAB — CMP (CANCER CENTER ONLY)
ALT: 16 U/L (ref 0–44)
AST: 14 U/L — ABNORMAL LOW (ref 15–41)
Albumin: 3.6 g/dL (ref 3.5–5.0)
Alkaline Phosphatase: 103 U/L (ref 38–126)
Anion gap: 11 (ref 5–15)
BUN: 29 mg/dL — ABNORMAL HIGH (ref 8–23)
CO2: 20 mmol/L — ABNORMAL LOW (ref 22–32)
Calcium: 9 mg/dL (ref 8.9–10.3)
Chloride: 106 mmol/L (ref 98–111)
Creatinine: 2.24 mg/dL — ABNORMAL HIGH (ref 0.61–1.24)
GFR, Est AFR Am: 33 mL/min — ABNORMAL LOW (ref >60)
GFR, Estimated: 28 mL/min — ABNORMAL LOW (ref >60)
Glucose, Bld: 154 mg/dL — ABNORMAL HIGH (ref 70–99)
Potassium: 3.9 mmol/L (ref 3.5–5.1)
Sodium: 137 mmol/L (ref 135–145)
Total Bilirubin: 0.3 mg/dL (ref 0.3–1.2)
Total Protein: 7.6 g/dL (ref 6.5–8.1)

## 2019-02-06 LAB — CBC WITH DIFFERENTIAL (CANCER CENTER ONLY)
Abs Immature Granulocytes: 0.07 10*3/uL (ref 0.00–0.07)
Basophils Absolute: 0.1 10*3/uL (ref 0.0–0.1)
Basophils Relative: 1 %
Eosinophils Absolute: 0.3 10*3/uL (ref 0.0–0.5)
Eosinophils Relative: 2 %
HCT: 35.8 % — ABNORMAL LOW (ref 39.0–52.0)
Hemoglobin: 12.2 g/dL — ABNORMAL LOW (ref 13.0–17.0)
Immature Granulocytes: 1 %
Lymphocytes Relative: 36 %
Lymphs Abs: 4.1 10*3/uL — ABNORMAL HIGH (ref 0.7–4.0)
MCH: 31.6 pg (ref 26.0–34.0)
MCHC: 34.1 g/dL (ref 30.0–36.0)
MCV: 92.7 fL (ref 80.0–100.0)
Monocytes Absolute: 1.1 10*3/uL — ABNORMAL HIGH (ref 0.1–1.0)
Monocytes Relative: 10 %
Neutro Abs: 5.7 10*3/uL (ref 1.7–7.7)
Neutrophils Relative %: 50 %
Platelet Count: 263 10*3/uL (ref 150–400)
RBC: 3.86 MIL/uL — ABNORMAL LOW (ref 4.22–5.81)
RDW: 14.7 % (ref 11.5–15.5)
WBC Count: 11.2 10*3/uL — ABNORMAL HIGH (ref 4.0–10.5)
nRBC: 0 % (ref 0.0–0.2)

## 2019-02-06 MED ORDER — PROCHLORPERAZINE MALEATE 10 MG PO TABS: 10.0000 mg | ORAL_TABLET | Freq: Four times a day (QID) | ORAL | 0 refills | Status: DC | PRN

## 2019-02-06 NOTE — Progress Notes (Signed)
The proposed treatment discussed in cancer conference 02/06/2019 is for discussion purpose only an dis not a binding recommendation.  The patient was not physically examined nor present for their treatment options.  Therefore, final treatment plans cannot be decided.

## 2019-02-06 NOTE — Telephone Encounter (Signed)
Gave avs and calendar ° °

## 2019-02-06 NOTE — Progress Notes (Signed)
START ON PATHWAY REGIMEN - Small Cell Lung     A cycle is every 21 days:     Etoposide      Carboplatin   **Always confirm dose/schedule in your pharmacy ordering system**  Patient Characteristics: Newly Diagnosed, Preoperative or Nonsurgical Candidate (Clinical Staging), First Line, Limited Stage, Nonsurgical Candidate Therapeutic Status: Newly Diagnosed, Preoperative or Nonsurgical Candidate (Clinical Staging) AJCC T Category: cT2a AJCC N Category: cN1 AJCC M Category: cM0 AJCC 8 Stage Grouping: IIB Stage Classification: Limited Surgical Candidacy: Nonsurgical Candidate Intent of Therapy: Curative Intent, Discussed with Patient

## 2019-02-06 NOTE — Progress Notes (Signed)
[place] [phone]   [phone]  CONSULT NOTE  REFERRING PHYSICIAN: Dr. Lanelle Bal.  REASON FOR CONSULTATION:  73 years old white male recently diagnosed with lung cancer.  Jeremy Johnson is a 73 y.o. male with past medical history significant for coronary artery disease, chronic kidney disease, hypertension, dyslipidemia, gout, hypothyroidism and GERD as well as long history for smoking but quit in November 2018.  The patient was referred to Dr. Servando Snare for evaluation of a dilated aortic arch.  He had repeat CT scan of the chest on January 09, 2019 and that showed an enlarging mass within the central left lung which she crosses the oblique fissure and involving the posterior left upper lobe and superior segment of left lower lobe it measured 2.2 x 3.2 x 4.0 cm.  The scan also showed low left paratracheal lymph node measuring 0.7 cm.  A PET scan was performed on January 22, 2019 and it showed central left perihilar lung mass which crosses the oblique fissure and exhibit intense radiotracer uptake with SUV max of 14.2.  There was also hypermetabolic left hilar lymph node and mild to moderate increased uptake identified within lower left paratracheal lymph node equivocal for metastatic adenopathy.  It showed no hypermetabolic contralateral mediastinal, subcarinal or hilar lymph nodes and no evidence for distant metastatic disease.  On January 27, 2019 the patient underwent video bronchoscopy with endobronchial ultrasound and electromagnetic navigational bronchoscopy under the care of Dr. Servando Snare. The final pathology ([id number]) showed atypical cellular pleural variation. The biopsies are small with crush artifact; however, there is small round blue cell proliferation present. Given the very limited material a small immunohistochemistry panel was performed (CD56, chromogranin and synaptophysin). The atypical cells are positive for CD56 but negative for  chromogranin and synaptophysin. The overall features are worrisome for malignancy, specifically small cell carcinoma. Dr. Servando Snare kindly referred the patient to me today for evaluation and recommendation regarding treatment of his condition. When seen today the patient is feeling fine with no concerning complaints.  He denied having any chest pain, shortness of breath, cough or hemoptysis.  He denied having any nausea, vomiting, diarrhea or constipation.  He has no weight loss or night sweats.  He has no headache or visual changes. Family history mother died from old age at age 2, father had heart disease. The patient is married and has 2 children.  His wife.  He was available by phone during the visit because of limits on visitors.  The patient has a history for smoking 1 pack/day for around 55 years and quit in November 2018.  He also drinks alcohol occasion no history of drug abuse.  Jeremy  Past Medical History:  Diagnosis Date  . CAD (coronary artery disease)    STENT... MID CIRCUMFLEX...1997  . Chronic kidney disease    STAGE 3  . Degenerative joint disease (DJD) of lumbar spine   . GERD (gastroesophageal reflux disease)   . Gout   . Hyperlipidemia   . Hypertension   . Hypothyroidism   . Incisional hernia   . Leukocytosis    CHRONIC MILD  . Myocardial infarction ([place])    1997    Past Surgical History:  Procedure Laterality Date  . ABDOMINAL AORTIC ANEURYSM REPAIR  2006  . BIOPSY  09/30/2018   Procedure: BIOPSY;  Surgeon: Danie Binder, MD;  Location: [facility];  Service: Endoscopy;;  ascending colon  . COLONOSCOPY  2008  . COLONOSCOPY N/A  09/30/2018   Procedure: COLONOSCOPY;  Surgeon: Danie Binder, MD;  Location: [facility];  Service: Endoscopy;  Laterality: N/A;  9:00  . CORONARY ANGIOPLASTY WITH STENT PLACEMENT  2008   MID CIRCUMFLEX  . POLYPECTOMY  09/30/2018   Procedure: POLYPECTOMY;  Surgeon: Danie Binder, MD;  Location: [facility];  Service:  Endoscopy;;  colon  . VIDEO BRONCHOSCOPY WITH ENDOBRONCHIAL NAVIGATION N/A 01/27/2019   Procedure: VIDEO BRONCHOSCOPY WITH ENDOBRONCHIAL NAVIGATION;  Surgeon: Grace Isaac, MD;  Location: [place];  Service: Thoracic;  Laterality: N/A;  . VIDEO BRONCHOSCOPY WITH ENDOBRONCHIAL ULTRASOUND N/A 01/27/2019   Procedure: VIDEO BRONCHOSCOPY WITH ENDOBRONCHIAL ULTRASOUND;  Surgeon: Grace Isaac, MD;  Location: [facility];  Service: Thoracic;  Laterality: N/A;    Family History  Problem Relation Age of Onset  . Stroke Brother     Social History Social History   Tobacco Use  . Smoking status: Former Smoker    Packs/day: 0.50    Years: 54.00    Pack years: 27.00    Last attempt to quit: 09/17/2017    Years since quitting: 1.3  . Smokeless tobacco: Never Used  Substance Use Topics  . Alcohol use: Yes    Comment: occasional  . Drug use: No    Allergies  Allergen Reactions  . Penicillins Rash and Other (See Comments)    Has patient had a PCN reaction causing immediate rash, facial/tongue/throat swelling, SOB or lightheadedness with hypotension: No Has patient had a PCN reaction causing severe rash involving mucus membranes or skin necrosis: No Has patient had a PCN reaction that required hospitalization: No Has patient had a PCN reaction occurring within the last 10 years: No If all of the above answers are "NO", then may proceed with Cephalosporin use.     Current Outpatient Medications  Medication Sig Dispense Refill  . allopurinol (ZYLOPRIM) 300 MG tablet Take 300 mg by mouth daily.    [name] amLODipine (NORVASC) 5 MG tablet Take 5 mg by mouth daily.     [name] aspirin EC 81 MG tablet Take 81 mg by mouth daily.    . calcitRIOL (ROCALTROL) 0.25 MCG capsule Take 0.25 mcg by mouth daily.     [name] levothyroxine (SYNTHROID, LEVOTHROID) 175 MCG tablet Take 175 mcg by mouth daily before breakfast.    . losartan-hydrochlorothiazide (HYZAAR) 100-25 MG tablet Take 1 tablet by mouth daily.    .  metoprolol tartrate (LOPRESSOR) 50 MG tablet Take 25 mg by mouth 2 (two) times daily.     . mometasone (ELOCON) 0.1 % ointment Apply 1 application topically 2 (two) times daily as needed (for eczema).   2  . omeprazole (PRILOSEC) 20 MG capsule Take 20 mg by mouth daily.    . simvastatin (ZOCOR) 20 MG tablet Take 20 mg by mouth daily.      No current facility-administered medications for this visit.     Review of Systems  Constitutional: positive for fatigue Eyes: negative Ears, nose, mouth, throat, and face: negative Respiratory: negative Cardiovascular: negative Gastrointestinal: negative Genitourinary:negative Integument/breast: negative Hematologic/lymphatic: negative Musculoskeletal:negative Neurological: negative Behavioral/Psych: negative Endocrine: negative Allergic/Immunologic: negative  Physical Exam  AOZ:HYQMV, healthy, no distress, well nourished and well developed SKIN: skin color, texture, turgor are normal, no rashes or significant lesions HEAD: Normocephalic, No masses, lesions, tenderness or abnormalities EYES: normal, PERRLA, Conjunctiva are pink and non-injected EARS: External ears normal, Canals clear OROPHARYNX:no exudate, no erythema and lips, buccal mucosa, and tongue normal  NECK: supple, no adenopathy,  no JVD LYMPH:  no palpable lymphadenopathy, no hepatosplenomegaly LUNGS: clear to auscultation , and palpation HEART: regular rate & rhythm, no murmurs and no gallops ABDOMEN:abdomen soft, non-tender, normal bowel sounds and no masses or organomegaly BACK: Back symmetric, no curvature., No CVA tenderness EXTREMITIES:no joint deformities, effusion, or inflammation, no edema  NEURO: alert & oriented x 3 with fluent speech, no focal motor/sensory deficits  PERFORMANCE STATUS: ECOG 1  LABORATORY DATA: Lab Results  Component Value Date   WBC 11.2 (H) 02/06/2019   HGB 12.2 (L) 02/06/2019   HCT 35.8 (L) 02/06/2019   MCV 92.7 02/06/2019   PLT 263  02/06/2019      Chemistry      Component Value Date/Time   NA 138 01/24/2019 0834   K 3.8 01/24/2019 0834   CL 107 01/24/2019 0834   CO2 21 (L) 01/24/2019 0834   BUN 30 (H) 01/24/2019 0834   CREATININE 2.40 (H) 01/24/2019 0834      Component Value Date/Time   CALCIUM 9.2 01/24/2019 0834   ALKPHOS 82 01/24/2019 0834   AST 20 01/24/2019 0834   ALT 15 01/24/2019 0834   BILITOT 0.7 01/24/2019 0834       RADIOGRAPHIC STUDIES: Dg Chest 2 View  Result Date: 01/24/2019 CLINICAL DATA:  73 year old male with a history of bronchoscopy scheduled 3602020 EXAM: CHEST - 2 VIEW COMPARISON:  PET CT 01/22/2019, 01/09/2019, chest x-ray 11/11/2009 FINDINGS: Cardiomediastinal silhouette unchanged in size and contour. Abnormal tissue within the left hilar region not well visualized on the current plain film, better characterized on recent PET-CT. Coarsened interstitial markings with evidence of developing emphysema. Pleuroparenchymal thickening at the apices. No confluent airspace disease or pneumothorax. No displaced fracture IMPRESSION: Negative for acute cardiopulmonary disease. Chronic lung changes, with prior PET-CT better characterizing tissue in the left hilar region. Electronically Signed   By: Corrie Mckusick D.O.   On: 01/24/2019 09:10   Ct Chest Wo Contrast  Result Date: 01/09/2019 CLINICAL DATA:  Followup thoracic aortic aneurysm. EXAM: CT CHEST WITHOUT CONTRAST TECHNIQUE: Multidetector CT imaging of the chest was performed following the standard protocol without IV contrast. COMPARISON:  5/21/9 FINDINGS: Cardiovascular: Stable appearance of ascending thoracic aortic ectasia measuring 3.9 cm, image 51/4. The transverse thoracic aorta is focally dilated measuring 4.5 cm on the current exam, image 107/6. Previously this measured 4.5 cm. The descending thoracic aorta is diffusely ectopic as well with a focal area of increased caliber measuring 3.4 cm, image 107/6. Unchanged. Mediastinum/Nodes: No  axillary or supraclavicular adenopathy Normal appearance of the thyroid gland. The trachea appears patent and is midline. Normal appearance of the esophagus. Low left paratracheal lymph node measures 7 mm, image 47/11. Previously 6 mm. No right paratracheal or subcarinal adenopathy identified. Lungs/Pleura: No pleural effusion. There are advanced smoking related changes throughout both lungs including emphysema and diffuse bronchial wall thickening. There is a mass within the central left lung which crosses the oblique fissure and involves the posterior left upper lobe and superior segment of left lower lobe. On today's study this lesion is clearly identified measuring 2.2 x 3.2 by 4.0 cm. In retrospect this lesion was difficult to distinguish from the adjacent descending thoracic aorta due to lack of IV contrast material at this time this measured 1.0 x 1.5 by 1.8 cm. Highly concerning for primary bronchogenic carcinoma. Small nonspecific nodule in the left upper lobe measures 4 mm, image 40/8. New from previous exam. Stable nodule along the minor fissure of the right lung measuring 8  mm, image 89/8. New right middle lobe lung nodule measures 6 mm, image 96/8. Also new is a small nodule in the posterior right base measuring 4 mm. Upper Abdomen: No acute abnormality identified. Small left adrenal gland nodule measures 0.9 cm, image 138/11. Unchanged. Musculoskeletal: No aggressive lytic or sclerotic bone lesions. IMPRESSION: 1. Extensive aortic atherosclerosis with focal aneurysmal dilatation of the transverse aortic arch measures 4.5 cm. This is not significantly changed in the interval. Recommend semi-annual imaging followup by CTA or MRA and referral to cardiothoracic surgery if not already obtained. This recommendation follows 2010 ACCF/AHA/AATS/ACR/ASA/SCA/SCAI/SIR/STS/SVM Guidelines for the Diagnosis and Management of Patients With Thoracic Aortic Disease. Circulation. 2010; 121: E703-J00. Aortic aneurysm NOS  (ICD10-I71.9) 2. There is a enlarging mass within the central left lung which crosses the oblique fracture involving the posterior left upper lobe and superior segment of left lower lobe. Highly concerning for primary bronchogenic carcinoma. Further investigation with PET-CT and tissue sampling is advised. 3. There are several new small nonspecific nodules as described above. Attention on follow-up imaging is advised. 4. Diffuse bronchial wall thickening with emphysema, as above; imaging findings suggestive of underlying COPD. 5. Multi vessel coronary artery atherosclerotic calcifications. 6. These results will be called to the ordering clinician or representative by the Radiologist Assistant, and communication documented in the PACS or zVision Dashboard. Electronically Signed   By: Kerby Moors M.D.   On: 01/09/2019 14:27   Nm Pet Image Initial (pi) Skull Base To Thigh  Result Date: 01/22/2019 CLINICAL DATA:  Initial treatment strategy for lung mass. EXAM: NUCLEAR MEDICINE PET SKULL BASE TO THIGH TECHNIQUE: 11.0 mCi F-18 FDG was injected intravenously. Full-ring PET imaging was performed from the skull base to thigh after the radiotracer. CT data was obtained and used for attenuation correction and anatomic localization. Fasting blood glucose: 134 mg/dl COMPARISON:  01/09/2019 FINDINGS: Mediastinal blood pool activity: SUV max 3.09 NECK: No hypermetabolic lymph nodes in the neck. Incidental CT findings: none CHEST: No hypermetabolic supraclavicular or axillary lymph nodes. Mild to moderate uptake associated with low left paratracheal lymph nodes with SUV max of 3.59. There is a left hilar lymph node with intense uptake within SUV max of 15.66. The mass within the central left lung which crosses the oblique fissure and involves the posterior left upper lobe and superior segment of left lower lobe is again noted. This exhibits intense radiotracer uptake within SUV max of 14.27. No additional hypermetabolic  nodules or masses identified. Small perifissural nodules with in the minor fissure of the right lung are less than 1 cm and too small to characterize. No additional hypermetabolic pulmonary nodules or masses noted. Incidental CT findings: Thoracic aortic aneurysm and aortic atherosclerosis. Coronary artery calcifications. Moderate to advanced changes of emphysema. ABDOMEN/PELVIS: No abnormal hypermetabolic activity within the liver, pancreas, adrenal glands, or spleen. No hypermetabolic lymph nodes in the abdomen or pelvis. Incidental CT findings: Extensive aortic atherosclerosis. The infrarenal abdominal aorta measures 3.3 cm in maximum AP dimension. Right kidney cysts noted. Bilateral fat containing inguinal hernias. SKELETON: No focal hypermetabolic activity to suggest skeletal metastasis. Incidental CT findings: none IMPRESSION: 1. Chest the central left perihilar lung mass which crosses the oblique fissure is again noted. This exhibits intense radiotracer uptake within SUV max of 14.2. Hypermetabolic left hilar lymph node is also noted. Mild to moderate increased uptake identified within low left paratracheal lymph nodes, equivocal for metastatic adenopathy. No hypermetabolic contralateral mediastinal, subcarinal or hilar lymph nodes and no evidence for distant metastatic disease. 2.  Aortic atherosclerosis with thoracic and infrarenal abdominal aortic aneurysm. Electronically Signed   By: Kerby Moors M.D.   On: 01/22/2019 14:11   Dg C-arm Bronchoscopy  Result Date: 01/27/2019 C-ARM BRONCHOSCOPY: Fluoroscopy was utilized by the requesting physician.  No radiographic interpretation.    ASSESSMENT: This is a very pleasant 73 years old white male recently diagnosed with limited stage (T2b, N1, M0) small cell lung cancer presented with right upper lobe with invasion of superior segment of right lower lobe as well as right hilar lymphadenopathy diagnosed in March 2020.   PLAN: I had a lengthy discussion  with the patient today about his current disease stage, prognosis and treatment options.  I personally and independently reviewed the scan images and discussed the result with the patient and his wife. I recommended for the patient to complete the staging work-up by ordering MRI of the brain to rule out brain metastasis. I discussed with the patient his treatment options and recommended for him a course of systemic chemotherapy with carboplatin for AUC of 5 on day 1 and etoposide 100 mg/M2 on days 1, 2 and 3 every 3 weeks.  The patient is not a good candidate for treatment with cisplatin because of his renal insufficiency. His treatment will be concurrent with radiotherapy for 6 weeks during his chemotherapy. I discussed with the patient the adverse effect of the chemotherapy including but not limited to alopecia, myelosuppression, nausea and vomiting, peripheral neuropathy, liver or renal dysfunction. I will arrange for the patient to have a chemotherapy education class before the first dose of his treatment. I will call his pharmacy with prescription for Compazine 10 mg p.o. every 6 hours as needed for nausea. The patient will come back for follow-up visit in 1 week after the first dose of his treatment for evaluation and management of any adverse effect of his treatment. I will refer the patient to radiation oncology for evaluation and discussion of the radiotherapy option. The patient was advised to call immediately if he has any concerning symptoms in the interval. The patient voices understanding of current disease status and treatment options and is in agreement with the current care plan.  All questions were answered. The patient knows to call the clinic with any problems, questions or concerns. We can certainly see the patient much sooner if necessary.  Thank you so much for allowing me to participate in the care of Jeremy Johnson. I will continue to follow up the patient with you and assist  in his care.  I spent 55 minutes counseling the patient face to face. The total time spent in the appointment was 80 minutes.  Disclaimer: This note was dictated with voice recognition software. Similar sounding words can inadvertently be transcribed and may not be corrected upon review.   Eilleen Kempf February 06, 2019, 2:59 PM

## 2019-02-11 ENCOUNTER — Inpatient Hospital Stay: Payer: Medicare Other

## 2019-02-11 ENCOUNTER — Telehealth: Payer: Self-pay | Admitting: *Deleted

## 2019-02-17 ENCOUNTER — Encounter: Payer: Self-pay | Admitting: Internal Medicine

## 2019-02-17 ENCOUNTER — Telehealth: Payer: Self-pay | Admitting: *Deleted

## 2019-02-17 ENCOUNTER — Inpatient Hospital Stay: Payer: Medicare Other | Attending: Internal Medicine

## 2019-02-17 ENCOUNTER — Other Ambulatory Visit: Payer: Self-pay

## 2019-02-17 ENCOUNTER — Inpatient Hospital Stay: Payer: Medicare Other

## 2019-02-17 VITALS — BP 138/78 | HR 54 | Temp 98.3°F | Resp 18

## 2019-02-17 DIAGNOSIS — R05 Cough: Secondary | ICD-10-CM | POA: Diagnosis not present

## 2019-02-17 DIAGNOSIS — E785 Hyperlipidemia, unspecified: Secondary | ICD-10-CM | POA: Insufficient documentation

## 2019-02-17 DIAGNOSIS — C3492 Malignant neoplasm of unspecified part of left bronchus or lung: Secondary | ICD-10-CM

## 2019-02-17 DIAGNOSIS — I252 Old myocardial infarction: Secondary | ICD-10-CM | POA: Insufficient documentation

## 2019-02-17 DIAGNOSIS — Z5111 Encounter for antineoplastic chemotherapy: Secondary | ICD-10-CM | POA: Insufficient documentation

## 2019-02-17 DIAGNOSIS — Z7982 Long term (current) use of aspirin: Secondary | ICD-10-CM | POA: Insufficient documentation

## 2019-02-17 DIAGNOSIS — Z5189 Encounter for other specified aftercare: Secondary | ICD-10-CM | POA: Diagnosis not present

## 2019-02-17 DIAGNOSIS — I129 Hypertensive chronic kidney disease with stage 1 through stage 4 chronic kidney disease, or unspecified chronic kidney disease: Secondary | ICD-10-CM | POA: Diagnosis not present

## 2019-02-17 DIAGNOSIS — E039 Hypothyroidism, unspecified: Secondary | ICD-10-CM | POA: Insufficient documentation

## 2019-02-17 DIAGNOSIS — C3431 Malignant neoplasm of lower lobe, right bronchus or lung: Secondary | ICD-10-CM | POA: Diagnosis not present

## 2019-02-17 DIAGNOSIS — C3411 Malignant neoplasm of upper lobe, right bronchus or lung: Secondary | ICD-10-CM | POA: Diagnosis not present

## 2019-02-17 DIAGNOSIS — Z79899 Other long term (current) drug therapy: Secondary | ICD-10-CM | POA: Insufficient documentation

## 2019-02-17 DIAGNOSIS — N183 Chronic kidney disease, stage 3 (moderate): Secondary | ICD-10-CM | POA: Insufficient documentation

## 2019-02-17 DIAGNOSIS — M109 Gout, unspecified: Secondary | ICD-10-CM | POA: Diagnosis not present

## 2019-02-17 DIAGNOSIS — K219 Gastro-esophageal reflux disease without esophagitis: Secondary | ICD-10-CM | POA: Insufficient documentation

## 2019-02-17 LAB — CBC WITH DIFFERENTIAL (CANCER CENTER ONLY)
Abs Immature Granulocytes: 0.05 10*3/uL (ref 0.00–0.07)
Basophils Absolute: 0.1 10*3/uL (ref 0.0–0.1)
Basophils Relative: 1 %
Eosinophils Absolute: 0.4 10*3/uL (ref 0.0–0.5)
Eosinophils Relative: 4 %
HCT: 34.5 % — ABNORMAL LOW (ref 39.0–52.0)
Hemoglobin: 11.9 g/dL — ABNORMAL LOW (ref 13.0–17.0)
Immature Granulocytes: 1 %
Lymphocytes Relative: 41 %
Lymphs Abs: 3.9 10*3/uL (ref 0.7–4.0)
MCH: 31.6 pg (ref 26.0–34.0)
MCHC: 34.5 g/dL (ref 30.0–36.0)
MCV: 91.8 fL (ref 80.0–100.0)
Monocytes Absolute: 1 10*3/uL (ref 0.1–1.0)
Monocytes Relative: 11 %
Neutro Abs: 4.1 10*3/uL (ref 1.7–7.7)
Neutrophils Relative %: 42 %
Platelet Count: 252 10*3/uL (ref 150–400)
RBC: 3.76 MIL/uL — ABNORMAL LOW (ref 4.22–5.81)
RDW: 14.6 % (ref 11.5–15.5)
WBC Count: 9.6 10*3/uL (ref 4.0–10.5)
nRBC: 0 % (ref 0.0–0.2)

## 2019-02-17 LAB — CMP (CANCER CENTER ONLY)
ALT: 13 U/L (ref 0–44)
AST: 14 U/L — ABNORMAL LOW (ref 15–41)
Albumin: 3.5 g/dL (ref 3.5–5.0)
Alkaline Phosphatase: 104 U/L (ref 38–126)
Anion gap: 9 (ref 5–15)
BUN: 32 mg/dL — ABNORMAL HIGH (ref 8–23)
CO2: 22 mmol/L (ref 22–32)
Calcium: 9.2 mg/dL (ref 8.9–10.3)
Chloride: 107 mmol/L (ref 98–111)
Creatinine: 2.25 mg/dL — ABNORMAL HIGH (ref 0.61–1.24)
GFR, Est AFR Am: 33 mL/min — ABNORMAL LOW (ref >60)
GFR, Estimated: 28 mL/min — ABNORMAL LOW (ref >60)
Glucose, Bld: 132 mg/dL — ABNORMAL HIGH (ref 70–99)
Potassium: 4 mmol/L (ref 3.5–5.1)
Sodium: 138 mmol/L (ref 135–145)
Total Bilirubin: 0.3 mg/dL (ref 0.3–1.2)
Total Protein: 7.2 g/dL (ref 6.5–8.1)

## 2019-02-17 MED ORDER — DEXAMETHASONE SODIUM PHOSPHATE 10 MG/ML IJ SOLN
10.0000 mg | Freq: Once | INTRAMUSCULAR | Status: AC
  Administered 2019-02-17: 10 mg via INTRAVENOUS

## 2019-02-17 MED ORDER — PALONOSETRON HCL INJECTION 0.25 MG/5ML
INTRAVENOUS | Status: AC
  Filled 2019-02-17: qty 5

## 2019-02-17 MED ORDER — SODIUM CHLORIDE 0.9 % IV SOLN: 95.0000 mg/m2 | Freq: Once | INTRAVENOUS | Status: DC

## 2019-02-17 MED ORDER — SODIUM CHLORIDE 0.9 % IV SOLN
80.0000 mg/m2 | Freq: Once | INTRAVENOUS | Status: AC
  Administered 2019-02-17: 170 mg via INTRAVENOUS
  Filled 2019-02-17: qty 8.5

## 2019-02-17 MED ORDER — SODIUM CHLORIDE 0.9 % IV SOLN
Freq: Once | INTRAVENOUS | Status: AC
  Administered 2019-02-17: 09:00:00 via INTRAVENOUS
  Filled 2019-02-17: qty 250

## 2019-02-17 MED ORDER — DEXAMETHASONE SODIUM PHOSPHATE 10 MG/ML IJ SOLN
INTRAMUSCULAR | Status: AC
  Filled 2019-02-17: qty 1

## 2019-02-17 MED ORDER — PALONOSETRON HCL INJECTION 0.25 MG/5ML
0.2500 mg | Freq: Once | INTRAVENOUS | Status: AC
  Administered 2019-02-17: 0.25 mg via INTRAVENOUS

## 2019-02-17 MED ORDER — SODIUM CHLORIDE 0.9 % IV SOLN
315.5000 mg | Freq: Once | INTRAVENOUS | Status: AC
  Administered 2019-02-17: 320 mg via INTRAVENOUS
  Filled 2019-02-17: qty 32

## 2019-02-17 NOTE — Telephone Encounter (Signed)
Patient education information given to pt & reviewed meds & items in his binder & answered questions.

## 2019-02-17 NOTE — Progress Notes (Signed)
Per Dr. Julien Nordmann, okay to treat with crt of 2.25

## 2019-02-17 NOTE — Patient Instructions (Signed)
[facility] Discharge Instructions for Patients Receiving Chemotherapy  Today you received the following chemotherapy agents Carboplatin and Etoposide  To help prevent nausea and vomiting after your treatment, we encourage you to take your nausea medication as directed   If you develop nausea and vomiting that is not controlled by your nausea medication, call the clinic.   BELOW ARE SYMPTOMS THAT SHOULD BE REPORTED IMMEDIATELY:  *FEVER GREATER THAN 100.5 F  *CHILLS WITH OR WITHOUT FEVER  NAUSEA AND VOMITING THAT IS NOT CONTROLLED WITH YOUR NAUSEA MEDICATION  *UNUSUAL SHORTNESS OF BREATH  *UNUSUAL BRUISING OR BLEEDING  TENDERNESS IN MOUTH AND THROAT WITH OR WITHOUT PRESENCE OF ULCERS  *URINARY PROBLEMS  *BOWEL PROBLEMS  UNUSUAL RASH Items with * indicate a potential emergency and should be followed up as soon as possible.  Feel free to call the clinic should you have any questions or concerns. The clinic phone number is [phone].  Please show the [facility] at check-in to the Emergency Department and triage nurse.  Carboplatin injection What is this medicine? CARBOPLATIN (KAR boe pla tin) is a chemotherapy drug. It targets fast dividing cells, like cancer cells, and causes these cells to die. This medicine is used to treat ovarian cancer and many other cancers. This medicine may be used for other purposes; ask your health care provider or pharmacist if you have questions. COMMON BRAND NAME(S): Paraplatin What should I tell my health care provider before I take this medicine? They need to know if you have any of these conditions: -blood disorders -hearing problems -kidney disease -recent or ongoing radiation therapy -an unusual or allergic reaction to carboplatin, cisplatin, other chemotherapy, other medicines, foods, dyes, or preservatives -pregnant or trying to get pregnant -breast-feeding How should I use this medicine? This drug is usually  given as an infusion into a vein. It is administered in a hospital or clinic by a specially trained health care professional. Talk to your pediatrician regarding the use of this medicine in children. Special care may be needed. Overdosage: If you think you have taken too much of this medicine contact a poison control center or emergency room at once. NOTE: This medicine is only for you. Do not share this medicine with others. What if I miss a dose? It is important not to miss a dose. Call your doctor or health care professional if you are unable to keep an appointment. What may interact with this medicine? -medicines for seizures -medicines to increase blood counts like filgrastim, pegfilgrastim, sargramostim -some antibiotics like amikacin, gentamicin, neomycin, streptomycin, tobramycin -vaccines Talk to your doctor or health care professional before taking any of these medicines: -acetaminophen -aspirin -ibuprofen -ketoprofen -naproxen This list may not describe all possible interactions. Give your health care provider a list of all the medicines, herbs, non-prescription drugs, or dietary supplements you use. Also tell them if you smoke, drink alcohol, or use illegal drugs. Some items may interact with your medicine. What should I watch for while using this medicine? Your condition will be monitored carefully while you are receiving this medicine. You will need important blood work done while you are taking this medicine. This drug may make you feel generally unwell. This is not uncommon, as chemotherapy can affect healthy cells as well as cancer cells. Report any side effects. Continue your course of treatment even though you feel ill unless your doctor tells you to stop. In some cases, you may be given additional medicines to help with side effects.  Follow all directions for their use. Call your doctor or health care professional for advice if you get a fever, chills or sore throat, or  other symptoms of a cold or flu. Do not treat yourself. This drug decreases your body's ability to fight infections. Try to avoid being around people who are sick. This medicine may increase your risk to bruise or bleed. Call your doctor or health care professional if you notice any unusual bleeding. Be careful brushing and flossing your teeth or using a toothpick because you may get an infection or bleed more easily. If you have any dental work done, tell your dentist you are receiving this medicine. Avoid taking products that contain aspirin, acetaminophen, ibuprofen, naproxen, or ketoprofen unless instructed by your doctor. These medicines may hide a fever. Do not become pregnant while taking this medicine. Women should inform their doctor if they wish to become pregnant or think they might be pregnant. There is a potential for serious side effects to an unborn child. Talk to your health care professional or pharmacist for more information. Do not breast-feed an infant while taking this medicine. What side effects may I notice from receiving this medicine? Side effects that you should report to your doctor or health care professional as soon as possible: -allergic reactions like skin rash, itching or hives, swelling of the face, lips, or tongue -signs of infection - fever or chills, cough, sore throat, pain or difficulty passing urine -signs of decreased platelets or bleeding - bruising, pinpoint red spots on the skin, black, tarry stools, nosebleeds -signs of decreased red blood cells - unusually weak or tired, fainting spells, lightheadedness -breathing problems -changes in hearing -changes in vision -chest pain -high blood pressure -low blood counts - This drug may decrease the number of white blood cells, red blood cells and platelets. You may be at increased risk for infections and bleeding. -nausea and vomiting -pain, swelling, redness or irritation at the injection site -pain, tingling,  numbness in the hands or feet -problems with balance, talking, walking -trouble passing urine or change in the amount of urine Side effects that usually do not require medical attention (report to your doctor or health care professional if they continue or are bothersome): -hair loss -loss of appetite -metallic taste in the mouth or changes in taste This list may not describe all possible side effects. Call your doctor for medical advice about side effects. You may report side effects to FDA at 1-800-FDA-1088. Where should I keep my medicine? This drug is given in a hospital or clinic and will not be stored at home. NOTE: This sheet is a summary. It may not cover all possible information. If you have questions about this medicine, talk to your doctor, pharmacist, or health care provider.  2019 Elsevier/Gold Standard (2008-02-04 14:38:05)  Etoposide, VP-16 capsules What is this medicine? ETOPOSIDE, VP-16 (e toe POE side) is a chemotherapy drug. It is used to treat small cell lung cancer and other cancers. This medicine may be used for other purposes; ask your health care provider or pharmacist if you have questions. COMMON BRAND NAME(S): VePesid What should I tell my health care provider before I take this medicine? They need to know if you have any of these conditions: -infection -kidney disease -liver disease -low blood counts, like low white cell, platelet, or red cell counts -an unusual or allergic reaction to etoposide, other medicines, foods, dyes, or preservatives -pregnant or trying to get pregnant -breast-feeding How should I use this  medicine? Take this medicine by mouth with a glass of water. Follow the directions on the prescription label. Do not open, crush, or chew the capsules. It is advisable to wear gloves when handling this medicine. Take your medicine at regular intervals. Do not take it more often than directed. Do not stop taking except on your doctor's advice. Talk  to your pediatrician regarding the use of this medicine in children. Special care may be needed. Overdosage: If you think you have taken too much of this medicine contact a poison control center or emergency room at once. NOTE: This medicine is only for you. Do not share this medicine with others. What if I miss a dose? If you miss a dose, take it as soon as you can. If it is almost time for your next dose, take only that dose. Do not take double or extra doses. What may interact with this medicine? -aspirin -certain medications for seizures like carbamazepine, phenobarbital, phenytoin, valproic acid -cyclosporine -levamisole -valproic acid -warfarin This list may not describe all possible interactions. Give your health care provider a list of all the medicines, herbs, non-prescription drugs, or dietary supplements you use. Also tell them if you smoke, drink alcohol, or use illegal drugs. Some items may interact with your medicine. What should I watch for while using this medicine? Visit your doctor for checks on your progress. This drug may make you feel generally unwell. This is not uncommon, as chemotherapy can affect healthy cells as well as cancer cells. Report any side effects. Continue your course of treatment even though you feel ill unless your doctor tells you to stop. In some cases, you may be given additional medicines to help with side effects. Follow all directions for their use. Call your doctor or health care professional for advice if you get a fever, chills or sore throat, or other symptoms of a cold or flu. Do not treat yourself. This drug decreases your body's ability to fight infections. Try to avoid being around people who are sick. This medicine may increase your risk to bruise or bleed. Call your doctor or health care professional if you notice any unusual bleeding. Talk to your doctor about your risk of cancer. You may be more at risk for certain types of cancers if you  take this medicine. Do not become pregnant while taking this medicine or for at least 6 months after stopping it. Women should inform their doctor if they wish to become pregnant or think they might be pregnant. Women of child-bearing potential will need to have a negative pregnancy test before starting this medicine. There is a potential for serious side effects to an unborn child. Talk to your health care professional or pharmacist for more information. Do not breast-feed an infant while taking this medicine. Men must use a latex condom during sexual contact with a woman while taking this medicine and for at least 4 months after stopping it. A latex condom is needed even if you have had a vasectomy. Contact your doctor right away if your partner becomes pregnant. Do not donate sperm while taking this medicine and for 4 months after you stop taking this medicine. Men should inform their doctors if they wish to father a child. This medicine may lower sperm counts. What side effects may I notice from receiving this medicine? Side effects that you should report to your doctor or health care professional as soon as possible: -allergic reactions like skin rash, itching or hives, swelling of the  face, lips, or tongue -low blood counts - this medicine may decrease the number of white blood cells, red blood cells and platelets. You may be at increased risk for infections and bleeding. -signs of infection - fever or chills, cough, sore throat, pain or difficulty passing urine -signs of decreased platelets or bleeding - bruising, pinpoint red spots on the skin, black, tarry stools, blood in the urine -signs of decreased red blood cells - unusually weak or tired, fainting spells, lightheadedness -breathing problems -changes in vision -mouth or throat sores or ulcers -pain, tingling, numbness in the hands or feet -redness, blistering, peeling or loosening of the skin, including inside the  mouth -seizures -vomiting Side effects that usually do not require medical attention (report to your doctor or health care professional if they continue or are bothersome): -change in taste -diarrhea -hair loss -nausea -stomach pain This list may not describe all possible side effects. Call your doctor for medical advice about side effects. You may report side effects to FDA at 1-800-FDA-1088. Where should I keep my medicine? Keep out of the reach of children. Store in a refrigerator between 2 and 8 degrees C (36 and 46 degrees F). Do not freeze. Throw away any unused medicine after the expiration date. NOTE: This sheet is a summary. It may not cover all possible information. If you have questions about this medicine, talk to your doctor, pharmacist, or health care provider.  2019 Elsevier/Gold Standard (2015-10-22 11:49:52)

## 2019-02-17 NOTE — Progress Notes (Signed)
Spoke w/ Dr. Julien Nordmann, decrease dose of etoposide to 80 mg/m2 for renal impairment to avoid toxicities as much as possible during COVID-19 outbreak. This will be dose for all subsequent cycles. Orders updated.   No Emend w/ C1 - will see how patient tolerates and add to subsequent cycles if needed.   Demetrius Charity, PharmD, [facility] Phone: [phone] 02/17/2019

## 2019-02-17 NOTE — Progress Notes (Signed)
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  Pt has 2 insurances so copay assistance shouldn't be needed.  I offered the [place] and went over what it covers but pt declined.  He has my card for any questions or concerns he may have in the future.

## 2019-02-18 ENCOUNTER — Inpatient Hospital Stay: Payer: Medicare Other

## 2019-02-18 ENCOUNTER — Other Ambulatory Visit: Payer: Self-pay

## 2019-02-18 VITALS — BP 132/77 | HR 64 | Temp 97.7°F | Resp 18

## 2019-02-18 DIAGNOSIS — M109 Gout, unspecified: Secondary | ICD-10-CM | POA: Diagnosis not present

## 2019-02-18 DIAGNOSIS — Z5189 Encounter for other specified aftercare: Secondary | ICD-10-CM | POA: Diagnosis not present

## 2019-02-18 DIAGNOSIS — C3431 Malignant neoplasm of lower lobe, right bronchus or lung: Secondary | ICD-10-CM | POA: Diagnosis not present

## 2019-02-18 DIAGNOSIS — C3411 Malignant neoplasm of upper lobe, right bronchus or lung: Secondary | ICD-10-CM | POA: Diagnosis not present

## 2019-02-18 DIAGNOSIS — K219 Gastro-esophageal reflux disease without esophagitis: Secondary | ICD-10-CM | POA: Diagnosis not present

## 2019-02-18 DIAGNOSIS — R05 Cough: Secondary | ICD-10-CM | POA: Diagnosis not present

## 2019-02-18 DIAGNOSIS — E785 Hyperlipidemia, unspecified: Secondary | ICD-10-CM | POA: Diagnosis not present

## 2019-02-18 DIAGNOSIS — I129 Hypertensive chronic kidney disease with stage 1 through stage 4 chronic kidney disease, or unspecified chronic kidney disease: Secondary | ICD-10-CM | POA: Diagnosis not present

## 2019-02-18 DIAGNOSIS — N183 Chronic kidney disease, stage 3 (moderate): Secondary | ICD-10-CM | POA: Diagnosis not present

## 2019-02-18 DIAGNOSIS — E039 Hypothyroidism, unspecified: Secondary | ICD-10-CM | POA: Diagnosis not present

## 2019-02-18 DIAGNOSIS — Z79899 Other long term (current) drug therapy: Secondary | ICD-10-CM | POA: Diagnosis not present

## 2019-02-18 DIAGNOSIS — I252 Old myocardial infarction: Secondary | ICD-10-CM | POA: Diagnosis not present

## 2019-02-18 DIAGNOSIS — C3492 Malignant neoplasm of unspecified part of left bronchus or lung: Secondary | ICD-10-CM

## 2019-02-18 DIAGNOSIS — Z7982 Long term (current) use of aspirin: Secondary | ICD-10-CM | POA: Diagnosis not present

## 2019-02-18 DIAGNOSIS — Z5111 Encounter for antineoplastic chemotherapy: Secondary | ICD-10-CM | POA: Diagnosis not present

## 2019-02-18 MED ORDER — DEXAMETHASONE SODIUM PHOSPHATE 10 MG/ML IJ SOLN
INTRAMUSCULAR | Status: AC
  Filled 2019-02-18: qty 1

## 2019-02-18 MED ORDER — DEXAMETHASONE SODIUM PHOSPHATE 10 MG/ML IJ SOLN
10.0000 mg | Freq: Once | INTRAMUSCULAR | Status: AC
  Administered 2019-02-18: 10 mg via INTRAVENOUS

## 2019-02-18 MED ORDER — SODIUM CHLORIDE 0.9 % IV SOLN
80.0000 mg/m2 | Freq: Once | INTRAVENOUS | Status: AC
  Administered 2019-02-18: 170 mg via INTRAVENOUS
  Filled 2019-02-18: qty 8.5

## 2019-02-18 MED ORDER — SODIUM CHLORIDE 0.9 % IV SOLN
Freq: Once | INTRAVENOUS | Status: AC
  Administered 2019-02-18: 09:00:00 via INTRAVENOUS
  Filled 2019-02-18: qty 250

## 2019-02-18 NOTE — Patient Instructions (Signed)
[facility] Discharge Instructions for Patients Receiving Chemotherapy  Today you received the following chemotherapy agents: Etoposide.  To help prevent nausea and vomiting after your treatment, we encourage you to take your nausea medication as directed   If you develop nausea and vomiting that is not controlled by your nausea medication, call the clinic.   BELOW ARE SYMPTOMS THAT SHOULD BE REPORTED IMMEDIATELY:  *FEVER GREATER THAN 100.5 F  *CHILLS WITH OR WITHOUT FEVER  NAUSEA AND VOMITING THAT IS NOT CONTROLLED WITH YOUR NAUSEA MEDICATION  *UNUSUAL SHORTNESS OF BREATH  *UNUSUAL BRUISING OR BLEEDING  TENDERNESS IN MOUTH AND THROAT WITH OR WITHOUT PRESENCE OF ULCERS  *URINARY PROBLEMS  *BOWEL PROBLEMS  UNUSUAL RASH Items with * indicate a potential emergency and should be followed up as soon as possible.  Feel free to call the clinic should you have any questions or concerns. The clinic phone number is [phone].  Please show the [facility] at check-in to the Emergency Department and triage nurse.  This information is directly available on the CDC website: [email or web address].za.html    Source:CDC Reference to specific commercial products, manufacturers, companies, or trademarks does not constitute its endorsement or recommendation by the [facility], [place], or Centers for [employer] and [employer].

## 2019-02-19 ENCOUNTER — Inpatient Hospital Stay: Payer: Medicare Other | Admitting: Medical

## 2019-02-19 ENCOUNTER — Other Ambulatory Visit: Payer: Self-pay

## 2019-02-19 ENCOUNTER — Inpatient Hospital Stay: Payer: Medicare Other

## 2019-02-19 VITALS — BP 132/71 | HR 60 | Temp 97.8°F | Resp 20

## 2019-02-19 DIAGNOSIS — M109 Gout, unspecified: Secondary | ICD-10-CM | POA: Diagnosis not present

## 2019-02-19 DIAGNOSIS — Z5189 Encounter for other specified aftercare: Secondary | ICD-10-CM | POA: Diagnosis not present

## 2019-02-19 DIAGNOSIS — E785 Hyperlipidemia, unspecified: Secondary | ICD-10-CM | POA: Diagnosis not present

## 2019-02-19 DIAGNOSIS — I129 Hypertensive chronic kidney disease with stage 1 through stage 4 chronic kidney disease, or unspecified chronic kidney disease: Secondary | ICD-10-CM | POA: Diagnosis not present

## 2019-02-19 DIAGNOSIS — R05 Cough: Secondary | ICD-10-CM | POA: Diagnosis not present

## 2019-02-19 DIAGNOSIS — C3431 Malignant neoplasm of lower lobe, right bronchus or lung: Secondary | ICD-10-CM | POA: Diagnosis not present

## 2019-02-19 DIAGNOSIS — I252 Old myocardial infarction: Secondary | ICD-10-CM | POA: Diagnosis not present

## 2019-02-19 DIAGNOSIS — N183 Chronic kidney disease, stage 3 (moderate): Secondary | ICD-10-CM | POA: Diagnosis not present

## 2019-02-19 DIAGNOSIS — E039 Hypothyroidism, unspecified: Secondary | ICD-10-CM | POA: Diagnosis not present

## 2019-02-19 DIAGNOSIS — Z79899 Other long term (current) drug therapy: Secondary | ICD-10-CM | POA: Diagnosis not present

## 2019-02-19 DIAGNOSIS — Z7982 Long term (current) use of aspirin: Secondary | ICD-10-CM | POA: Diagnosis not present

## 2019-02-19 DIAGNOSIS — C3492 Malignant neoplasm of unspecified part of left bronchus or lung: Secondary | ICD-10-CM

## 2019-02-19 DIAGNOSIS — K219 Gastro-esophageal reflux disease without esophagitis: Secondary | ICD-10-CM | POA: Diagnosis not present

## 2019-02-19 DIAGNOSIS — Z5111 Encounter for antineoplastic chemotherapy: Secondary | ICD-10-CM | POA: Diagnosis not present

## 2019-02-19 DIAGNOSIS — C3411 Malignant neoplasm of upper lobe, right bronchus or lung: Secondary | ICD-10-CM | POA: Diagnosis not present

## 2019-02-19 MED ORDER — DEXAMETHASONE SODIUM PHOSPHATE 10 MG/ML IJ SOLN
INTRAMUSCULAR | Status: AC
  Filled 2019-02-19: qty 1

## 2019-02-19 MED ORDER — SODIUM CHLORIDE 0.9 % IV SOLN
80.0000 mg/m2 | Freq: Once | INTRAVENOUS | Status: AC
  Administered 2019-02-19: 170 mg via INTRAVENOUS
  Filled 2019-02-19: qty 8.5

## 2019-02-19 MED ORDER — DEXAMETHASONE SODIUM PHOSPHATE 10 MG/ML IJ SOLN
10.0000 mg | Freq: Once | INTRAMUSCULAR | Status: AC
  Administered 2019-02-19: 10 mg via INTRAVENOUS

## 2019-02-19 MED ORDER — SODIUM CHLORIDE 0.9 % IV SOLN
Freq: Once | INTRAVENOUS | Status: AC
  Administered 2019-02-19: 09:00:00 via INTRAVENOUS
  Filled 2019-02-19: qty 250

## 2019-02-19 NOTE — Patient Instructions (Signed)
[facility] Discharge Instructions for Patients Receiving Chemotherapy  Today you received the following chemotherapy agents Etoposide (VEPESID).  To help prevent nausea and vomiting after your treatment, we encourage you to take your nausea medication as prescribed.   If you develop nausea and vomiting that is not controlled by your nausea medication, call the clinic.   BELOW ARE SYMPTOMS THAT SHOULD BE REPORTED IMMEDIATELY:  *FEVER GREATER THAN 100.5 F  *CHILLS WITH OR WITHOUT FEVER  NAUSEA AND VOMITING THAT IS NOT CONTROLLED WITH YOUR NAUSEA MEDICATION  *UNUSUAL SHORTNESS OF BREATH  *UNUSUAL BRUISING OR BLEEDING  TENDERNESS IN MOUTH AND THROAT WITH OR WITHOUT PRESENCE OF ULCERS  *URINARY PROBLEMS  *BOWEL PROBLEMS  UNUSUAL RASH Items with * indicate a potential emergency and should be followed up as soon as possible.  Feel free to call the clinic should you have any questions or concerns. The clinic phone number is [phone].  Please show the [facility] at check-in to the Emergency Department and triage nurse.  Coronavirus (COVID-19) Are you at risk?  Are you at risk for the Coronavirus (COVID-19)?  To be considered HIGH RISK for Coronavirus (COVID-19), you have to meet the following criteria:  . Traveled to [place], [place], [place], [place] or [place]; or in the [place] to [place], [place], [place], or [place]; and have fever, cough, and shortness of breath within the last 2 weeks of travel OR . Been in close contact with a person diagnosed with COVID-19 within the last 2 weeks and have fever, cough, and shortness of breath . IF YOU DO NOT MEET THESE CRITERIA, YOU ARE CONSIDERED LOW RISK FOR COVID-19.  What to do if you are HIGH RISK for COVID-19?  [name] If you are having a medical emergency, call 911. . Seek medical care right away. Before you go to a doctor's office, urgent care or emergency department, call ahead and tell them  about your recent travel, contact with someone diagnosed with COVID-19, and your symptoms. You should receive instructions from your physician's office regarding next steps of care.  . When you arrive at healthcare provider, tell the healthcare staff immediately you have returned from visiting [place], [place], [place], [place] or [place]; or traveled in the [place] to [place], [place], [place], or [place]; in the last two weeks or you have been in close contact with a person diagnosed with COVID-19 in the last 2 weeks.   . Tell the health care staff about your symptoms: fever, cough and shortness of breath. . After you have been seen by a medical provider, you will be either: o Tested for (COVID-19) and discharged home on quarantine except to seek medical care if symptoms worsen, and asked to  - Stay home and avoid contact with others until you get your results (4-5 days)  - Avoid travel on public transportation if possible (such as bus, train, or airplane) or o Sent to the Emergency Department by EMS for evaluation, COVID-19 testing, and possible admission depending on your condition and test results.  What to do if you are LOW RISK for COVID-19?  Reduce your risk of any infection by using the same precautions used for avoiding the common cold or flu:  [name] Wash your hands often with soap and warm water for at least 20 seconds.  If soap and water are not readily available, use an alcohol-based hand sanitizer with at least 60% alcohol.  . If coughing or  sneezing, cover your mouth and nose by coughing or sneezing into the elbow areas of your shirt or coat, into a tissue or into your sleeve (not your hands). . Avoid shaking hands with others and consider head nods or verbal greetings only. . Avoid touching your eyes, nose, or mouth with unwashed hands.  . Avoid close contact with people who are sick. . Avoid places or events with large numbers of people in one location, like concerts or  sporting events. . Carefully consider travel plans you have or are making. . If you are planning any travel outside or inside the [place], visit the CDC's Travelers' Health webpage for the latest health notices. . If you have some symptoms but not all symptoms, continue to monitor at home and seek medical attention if your symptoms worsen. . If you are having a medical emergency, call 911.   [place] / e-Visit: [email or web address]         [facility]: [facility]: [phone]                   [facility]: [phone]

## 2019-02-20 ENCOUNTER — Ambulatory Visit
Admission: RE | Admit: 2019-02-20 | Discharge: 2019-02-20 | Disposition: A | Discharge status: Home / Self Care | Payer: Medicare Other | Source: Ambulatory Visit | Attending: Internal Medicine | Admitting: Internal Medicine

## 2019-02-20 DIAGNOSIS — C3490 Malignant neoplasm of unspecified part of unspecified bronchus or lung: Secondary | ICD-10-CM | POA: Diagnosis not present

## 2019-02-20 MED ORDER — GADOBENATE DIMEGLUMINE 529 MG/ML IV SOLN
9.0000 mL | Freq: Once | INTRAVENOUS | Status: AC | PRN
  Administered 2019-02-20: 9 mL via INTRAVENOUS

## 2019-02-25 ENCOUNTER — Inpatient Hospital Stay: Payer: Medicare Other

## 2019-02-25 ENCOUNTER — Inpatient Hospital Stay (HOSPITAL_BASED_OUTPATIENT_CLINIC_OR_DEPARTMENT_OTHER): Payer: Medicare Other | Admitting: Internal Medicine

## 2019-02-25 ENCOUNTER — Encounter: Payer: Self-pay | Admitting: Internal Medicine

## 2019-02-25 ENCOUNTER — Other Ambulatory Visit: Payer: Self-pay

## 2019-02-25 VITALS — BP 136/69 | HR 69 | Temp 98.6°F | Resp 18 | Ht 69.0 in | Wt 200.5 lb

## 2019-02-25 DIAGNOSIS — E039 Hypothyroidism, unspecified: Secondary | ICD-10-CM

## 2019-02-25 DIAGNOSIS — C3411 Malignant neoplasm of upper lobe, right bronchus or lung: Secondary | ICD-10-CM

## 2019-02-25 DIAGNOSIS — Z79899 Other long term (current) drug therapy: Secondary | ICD-10-CM | POA: Diagnosis not present

## 2019-02-25 DIAGNOSIS — C3492 Malignant neoplasm of unspecified part of left bronchus or lung: Secondary | ICD-10-CM

## 2019-02-25 DIAGNOSIS — I252 Old myocardial infarction: Secondary | ICD-10-CM | POA: Diagnosis not present

## 2019-02-25 DIAGNOSIS — K219 Gastro-esophageal reflux disease without esophagitis: Secondary | ICD-10-CM | POA: Diagnosis not present

## 2019-02-25 DIAGNOSIS — R05 Cough: Secondary | ICD-10-CM

## 2019-02-25 DIAGNOSIS — Z5189 Encounter for other specified aftercare: Secondary | ICD-10-CM | POA: Diagnosis not present

## 2019-02-25 DIAGNOSIS — Z7982 Long term (current) use of aspirin: Secondary | ICD-10-CM

## 2019-02-25 DIAGNOSIS — M109 Gout, unspecified: Secondary | ICD-10-CM | POA: Diagnosis not present

## 2019-02-25 DIAGNOSIS — C3431 Malignant neoplasm of lower lobe, right bronchus or lung: Secondary | ICD-10-CM

## 2019-02-25 DIAGNOSIS — N183 Chronic kidney disease, stage 3 (moderate): Secondary | ICD-10-CM

## 2019-02-25 DIAGNOSIS — E785 Hyperlipidemia, unspecified: Secondary | ICD-10-CM

## 2019-02-25 DIAGNOSIS — I129 Hypertensive chronic kidney disease with stage 1 through stage 4 chronic kidney disease, or unspecified chronic kidney disease: Secondary | ICD-10-CM

## 2019-02-25 DIAGNOSIS — Z5111 Encounter for antineoplastic chemotherapy: Secondary | ICD-10-CM | POA: Diagnosis not present

## 2019-02-25 LAB — CBC WITH DIFFERENTIAL (CANCER CENTER ONLY)
Abs Immature Granulocytes: 0.07 10*3/uL (ref 0.00–0.07)
Basophils Absolute: 0 10*3/uL (ref 0.0–0.1)
Basophils Relative: 0 %
Eosinophils Absolute: 0.1 10*3/uL (ref 0.0–0.5)
Eosinophils Relative: 2 %
HCT: 32.8 % — ABNORMAL LOW (ref 39.0–52.0)
Hemoglobin: 10.9 g/dL — ABNORMAL LOW (ref 13.0–17.0)
Immature Granulocytes: 1 %
Lymphocytes Relative: 54 %
Lymphs Abs: 2.7 10*3/uL (ref 0.7–4.0)
MCH: 30.7 pg (ref 26.0–34.0)
MCHC: 33.2 g/dL (ref 30.0–36.0)
MCV: 92.4 fL (ref 80.0–100.0)
Monocytes Absolute: 0.1 10*3/uL (ref 0.1–1.0)
Monocytes Relative: 2 %
Neutro Abs: 2.1 10*3/uL (ref 1.7–7.7)
Neutrophils Relative %: 41 %
Platelet Count: 120 10*3/uL — ABNORMAL LOW (ref 150–400)
RBC: 3.55 MIL/uL — ABNORMAL LOW (ref 4.22–5.81)
RDW: 14.2 % (ref 11.5–15.5)
WBC Count: 5 10*3/uL (ref 4.0–10.5)
nRBC: 0 % (ref 0.0–0.2)

## 2019-02-25 LAB — CMP (CANCER CENTER ONLY)
ALT: 13 U/L (ref 0–44)
AST: 12 U/L — ABNORMAL LOW (ref 15–41)
Albumin: 3.3 g/dL — ABNORMAL LOW (ref 3.5–5.0)
Alkaline Phosphatase: 88 U/L (ref 38–126)
Anion gap: 11 (ref 5–15)
BUN: 46 mg/dL — ABNORMAL HIGH (ref 8–23)
CO2: 21 mmol/L — ABNORMAL LOW (ref 22–32)
Calcium: 8.8 mg/dL — ABNORMAL LOW (ref 8.9–10.3)
Chloride: 103 mmol/L (ref 98–111)
Creatinine: 2.31 mg/dL — ABNORMAL HIGH (ref 0.61–1.24)
GFR, Est AFR Am: 32 mL/min — ABNORMAL LOW (ref >60)
GFR, Estimated: 27 mL/min — ABNORMAL LOW (ref >60)
Glucose, Bld: 159 mg/dL — ABNORMAL HIGH (ref 70–99)
Potassium: 4.8 mmol/L (ref 3.5–5.1)
Sodium: 135 mmol/L (ref 135–145)
Total Bilirubin: 0.4 mg/dL (ref 0.3–1.2)
Total Protein: 6.7 g/dL (ref 6.5–8.1)

## 2019-02-25 NOTE — Progress Notes (Signed)
[place] [phone]   [phone]  OFFICE PROGRESS NOTE  [name], MD [address] [place] [address]  DIAGNOSIS: Limited stage (T2b, N1, M0) small cell lung cancer presented with right upper lobe with invasion of superior segment of right lower lobe as well as right hilar lymphadenopathy diagnosed in March 2020.  PRIOR THERAPY: None.  CURRENT THERAPY: Systemic chemotherapy with carboplatin for AUC of 5 on day 1 and etoposide 100 mg/M2 on days 1, 2 and 3 every 3 weeks.  INTERVAL HISTORY: Jeremy Johnson 73 y.o. male returns to the clinic today for returns to the clinic today for follow-up visit.  His wife was on the phone.  The patient tolerated the first week of his treatment well with no concerning complaints.  He denied having any chest pain, shortness of breath but has mild cough with no hemoptysis.  He denied having any nausea, vomiting, diarrhea or constipation.  He has no headache or visual changes.  He had MRI of the brain performed last week that showed no evidence of metastatic disease to the brain.  The patient is here today for evaluation and repeat blood work.  MEDICAL HISTORY: Past Medical History:  Diagnosis Date  . CAD (coronary artery disease)    STENT... MID CIRCUMFLEX...1997  . Chronic kidney disease    STAGE 3  . Degenerative joint disease (DJD) of lumbar spine   . GERD (gastroesophageal reflux disease)   . Gout   . Hyperlipidemia   . Hypertension   . Hypothyroidism   . Incisional hernia   . Leukocytosis    CHRONIC MILD  . Myocardial infarction ([place])    1997    ALLERGIES:  is allergic to penicillins.  MEDICATIONS:  Current Outpatient Medications  Medication Sig Dispense Refill  . allopurinol (ZYLOPRIM) 300 MG tablet Take 300 mg by mouth daily.    [name] amLODipine (NORVASC) 5 MG tablet Take 5 mg by mouth daily.     [name] aspirin EC 81 MG tablet Take 81 mg by mouth daily.    . calcitRIOL (ROCALTROL)  0.25 MCG capsule Take 0.25 mcg by mouth daily.     [name] levothyroxine (SYNTHROID, LEVOTHROID) 175 MCG tablet Take 175 mcg by mouth daily before breakfast.    . losartan-hydrochlorothiazide (HYZAAR) 100-25 MG tablet Take 1 tablet by mouth daily.    . metoprolol tartrate (LOPRESSOR) 50 MG tablet Take 25 mg by mouth 2 (two) times daily.     . mometasone (ELOCON) 0.1 % ointment Apply 1 application topically 2 (two) times daily as needed (for eczema).   2  . omeprazole (PRILOSEC) 20 MG capsule Take 20 mg by mouth daily.    . prochlorperazine (COMPAZINE) 10 MG tablet Take 1 tablet (10 mg total) by mouth every 6 (six) hours as needed for nausea or vomiting. 30 tablet 0  . simvastatin (ZOCOR) 20 MG tablet Take 20 mg by mouth daily.      No current facility-administered medications for this visit.     SURGICAL HISTORY:  Past Surgical History:  Procedure Laterality Date  . ABDOMINAL AORTIC ANEURYSM REPAIR  2006  . BIOPSY  09/30/2018   Procedure: BIOPSY;  Surgeon: Danie Binder, MD;  [facility];  Service: Endoscopy;;  ascending colon  . COLONOSCOPY  2008  . COLONOSCOPY N/A 09/30/2018   Procedure: COLONOSCOPY;  Surgeon: Danie Binder, MD;  [facility];  Service: Endoscopy;  Laterality: N/A;  9:00  .  CORONARY ANGIOPLASTY WITH STENT PLACEMENT  2008   M[facility]  . POLYPECTOMY  09/30/2018   Procedure: POLYPECTOMY;  Surgeon: Danie Binder, MD;  [facility];  Service: Endoscopy;;  colon  . VIDEO BRONCHOSCOPY WITH ENDOBRONCHIAL NAVIGATION N/A 01/27/2019   Procedure: VIDEO BRONCHOSCOPY WITH ENDOBRONCHIAL NAVIGATION;  Surgeon: Grace Isaac, MD;  Location: [facility] [place];  Service: Thoracic;  Laterality: N/A;  . VIDEO BRONCHOSCOPY WITH ENDOBRONCHIAL ULTRASOUND N/A 01/27/2019   Procedure: VIDEO BRONCHOSCOPY WITH ENDOBRONCHIAL ULTRASOUND;  Surgeon: Grace Isaac, MD;  Location: [facility];  Service: Thoracic;  Laterality: N/A;    REVIEW OF SYSTEMS:  A comprehensive  review of systems was negative.   PHYSICAL EXAMINATION: General appearance: alert, cooperative and no distress Head: Normocephalic, without obvious abnormality, atraumatic Neck: no adenopathy, no JVD, supple, symmetrical, trachea midline and thyroid not enlarged, symmetric, no tenderness/mass/nodules Lymph nodes: Cervical, supraclavicular, and axillary nodes normal. Resp: clear to auscultation bilaterally Back: symmetric, no curvature. ROM normal. No CVA tenderness. Cardio: regular rate and rhythm, S1, S2 normal, no murmur, click, rub or gallop GI: soft, non-tender; bowel sounds normal; no masses,  no organomegaly Extremities: extremities normal, atraumatic, no cyanosis or edema  ECOG PERFORMANCE STATUS: 1 - Symptomatic but completely ambulatory  Blood pressure 136/69, pulse 69, temperature 98.6 F (37 C), temperature source Oral, resp. rate 18, height 5\' 9"  (1.753 m), weight 200 lb 8 oz (90.9 kg), SpO2 100 %.  LABORATORY DATA: Lab Results  Component Value Date   WBC 5.0 02/25/2019   HGB 10.9 (L) 02/25/2019   HCT 32.8 (L) 02/25/2019   MCV 92.4 02/25/2019   PLT 120 (L) 02/25/2019      Chemistry      Component Value Date/Time   NA 138 02/17/2019 0820   K 4.0 02/17/2019 0820   CL 107 02/17/2019 0820   CO2 22 02/17/2019 0820   BUN 32 (H) 02/17/2019 0820   CREATININE 2.25 (H) 02/17/2019 0820      Component Value Date/Time   CALCIUM 9.2 02/17/2019 0820   ALKPHOS 104 02/17/2019 0820   AST 14 (L) 02/17/2019 0820   ALT 13 02/17/2019 0820   BILITOT 0.3 02/17/2019 0820       RADIOGRAPHIC STUDIES: Mr Jeri Cos HY Contrast  Result Date: 02/20/2019 CLINICAL DATA:  Non-small-cell lung cancer staging Creatinine was obtained on site at [facility] at [address] Results: Creatinine 2.1 mg/dL. EXAM: MRI HEAD WITHOUT AND WITH CONTRAST TECHNIQUE: Multiplanar, multiecho pulse sequences of the brain and surrounding structures were obtained without and with intravenous  contrast. CONTRAST:  9mL MULTIHANCE GADOBENATE DIMEGLUMINE 529 MG/ML IV SOLN COMPARISON:  None. FINDINGS: Brain: Ventricle size normal. Negative for acute infarct. Minimal white matter hyperintensities bilaterally which do not enhance. No hemorrhage, mass or edema. No enhancing lesions are seen postcontrast administration. Negative for metastatic disease. Vascular: Normal arterial flow voids Skull and upper cervical spine: Negative Sinuses/Orbits: Minimal mucosal edema paranasal sinuses. Normal orbit Other: None IMPRESSION: No acute abnormality.  Negative for metastatic disease. Electronically Signed   By: Franchot Gallo M.D.   On: 02/20/2019 16:12   Dg C-arm Bronchoscopy  Result Date: 01/27/2019 C-ARM BRONCHOSCOPY: Fluoroscopy was utilized by the requesting physician.  No radiographic interpretation.    ASSESSMENT AND PLAN: This is a very pleasant 73 years old white male with limited stage small cell lung cancer and currently undergoing systemic chemotherapy with carboplatin and etoposide status post 1 cycle. He tolerated the first week of his treatment well  with no concerning complaints. He had MRI of the brain performed recently that showed no concerning findings for metastatic disease to the brain.  I discussed the results with the patient today. I recommended for him to continue his current treatment with systemic chemotherapy and he will come back for follow-up visit in 2 weeks for evaluation before starting cycle #3. The patient was advised to call immediately if he has any concerning symptoms in the interval. The patient voices understanding of current disease status and treatment options and is in agreement with the current care plan.  All questions were answered. The patient knows to call the clinic with any problems, questions or concerns. We can certainly see the patient much sooner if necessary.  I spent 10 minutes counseling the patient face to face. The total time spent in the  appointment was 15 minutes.  Disclaimer: This note was dictated with voice recognition software. Similar sounding words can inadvertently be transcribed and may not be corrected upon review.

## 2019-03-03 ENCOUNTER — Other Ambulatory Visit: Payer: Self-pay

## 2019-03-03 ENCOUNTER — Inpatient Hospital Stay: Payer: Medicare Other

## 2019-03-03 DIAGNOSIS — Z79899 Other long term (current) drug therapy: Secondary | ICD-10-CM | POA: Diagnosis not present

## 2019-03-03 DIAGNOSIS — C3431 Malignant neoplasm of lower lobe, right bronchus or lung: Secondary | ICD-10-CM | POA: Diagnosis not present

## 2019-03-03 DIAGNOSIS — I129 Hypertensive chronic kidney disease with stage 1 through stage 4 chronic kidney disease, or unspecified chronic kidney disease: Secondary | ICD-10-CM | POA: Diagnosis not present

## 2019-03-03 DIAGNOSIS — Z5189 Encounter for other specified aftercare: Secondary | ICD-10-CM | POA: Diagnosis not present

## 2019-03-03 DIAGNOSIS — N183 Chronic kidney disease, stage 3 (moderate): Secondary | ICD-10-CM | POA: Diagnosis not present

## 2019-03-03 DIAGNOSIS — Z5111 Encounter for antineoplastic chemotherapy: Secondary | ICD-10-CM | POA: Diagnosis not present

## 2019-03-03 DIAGNOSIS — C3411 Malignant neoplasm of upper lobe, right bronchus or lung: Secondary | ICD-10-CM | POA: Diagnosis not present

## 2019-03-03 DIAGNOSIS — I252 Old myocardial infarction: Secondary | ICD-10-CM | POA: Diagnosis not present

## 2019-03-03 DIAGNOSIS — M109 Gout, unspecified: Secondary | ICD-10-CM | POA: Diagnosis not present

## 2019-03-03 DIAGNOSIS — E785 Hyperlipidemia, unspecified: Secondary | ICD-10-CM | POA: Diagnosis not present

## 2019-03-03 DIAGNOSIS — C3492 Malignant neoplasm of unspecified part of left bronchus or lung: Secondary | ICD-10-CM

## 2019-03-03 DIAGNOSIS — Z7982 Long term (current) use of aspirin: Secondary | ICD-10-CM | POA: Diagnosis not present

## 2019-03-03 DIAGNOSIS — R05 Cough: Secondary | ICD-10-CM | POA: Diagnosis not present

## 2019-03-03 DIAGNOSIS — K219 Gastro-esophageal reflux disease without esophagitis: Secondary | ICD-10-CM | POA: Diagnosis not present

## 2019-03-03 DIAGNOSIS — E039 Hypothyroidism, unspecified: Secondary | ICD-10-CM | POA: Diagnosis not present

## 2019-03-03 LAB — CMP (CANCER CENTER ONLY)
ALT: 17 U/L (ref 0–44)
AST: 13 U/L — ABNORMAL LOW (ref 15–41)
Albumin: 3.4 g/dL — ABNORMAL LOW (ref 3.5–5.0)
Alkaline Phosphatase: 113 U/L (ref 38–126)
Anion gap: 10 (ref 5–15)
BUN: 32 mg/dL — ABNORMAL HIGH (ref 8–23)
CO2: 21 mmol/L — ABNORMAL LOW (ref 22–32)
Calcium: 9.1 mg/dL (ref 8.9–10.3)
Chloride: 107 mmol/L (ref 98–111)
Creatinine: 2.24 mg/dL — ABNORMAL HIGH (ref 0.61–1.24)
GFR, Est AFR Am: 33 mL/min — ABNORMAL LOW (ref >60)
GFR, Estimated: 28 mL/min — ABNORMAL LOW (ref >60)
Glucose, Bld: 114 mg/dL — ABNORMAL HIGH (ref 70–99)
Potassium: 4.7 mmol/L (ref 3.5–5.1)
Sodium: 138 mmol/L (ref 135–145)
Total Bilirubin: 0.2 mg/dL — ABNORMAL LOW (ref 0.3–1.2)
Total Protein: 7.2 g/dL (ref 6.5–8.1)

## 2019-03-03 LAB — CBC WITH DIFFERENTIAL (CANCER CENTER ONLY)
Abs Immature Granulocytes: 0.01 10*3/uL (ref 0.00–0.07)
Basophils Absolute: 0 10*3/uL (ref 0.0–0.1)
Basophils Relative: 0 %
Eosinophils Absolute: 0.2 10*3/uL (ref 0.0–0.5)
Eosinophils Relative: 5 %
HCT: 30.7 % — ABNORMAL LOW (ref 39.0–52.0)
Hemoglobin: 10.4 g/dL — ABNORMAL LOW (ref 13.0–17.0)
Immature Granulocytes: 0 %
Lymphocytes Relative: 84 %
Lymphs Abs: 3.4 10*3/uL (ref 0.7–4.0)
MCH: 30.4 pg (ref 26.0–34.0)
MCHC: 33.9 g/dL (ref 30.0–36.0)
MCV: 89.8 fL (ref 80.0–100.0)
Monocytes Absolute: 0.3 10*3/uL (ref 0.1–1.0)
Monocytes Relative: 7 %
Neutro Abs: 0.2 10*3/uL — CL (ref 1.7–7.7)
Neutrophils Relative %: 4 %
Platelet Count: 121 10*3/uL — ABNORMAL LOW (ref 150–400)
RBC: 3.42 MIL/uL — ABNORMAL LOW (ref 4.22–5.81)
RDW: 13.6 % (ref 11.5–15.5)
WBC Count: 4.1 10*3/uL (ref 4.0–10.5)
nRBC: 0 % (ref 0.0–0.2)

## 2019-03-07 ENCOUNTER — Ambulatory Visit
Admission: RE | Admit: 2019-03-07 | Discharge: 2019-03-07 | Disposition: A | Discharge status: Home / Self Care | Payer: Medicare Other | Source: Ambulatory Visit | Attending: Radiation Oncology | Admitting: Radiation Oncology

## 2019-03-07 ENCOUNTER — Encounter: Payer: Self-pay | Admitting: Radiation Oncology

## 2019-03-07 ENCOUNTER — Other Ambulatory Visit: Payer: Self-pay

## 2019-03-07 VITALS — Ht 69.0 in | Wt 200.0 lb

## 2019-03-07 DIAGNOSIS — C3492 Malignant neoplasm of unspecified part of left bronchus or lung: Secondary | ICD-10-CM

## 2019-03-07 DIAGNOSIS — Z801 Family history of malignant neoplasm of trachea, bronchus and lung: Secondary | ICD-10-CM | POA: Diagnosis not present

## 2019-03-07 DIAGNOSIS — C3412 Malignant neoplasm of upper lobe, left bronchus or lung: Secondary | ICD-10-CM

## 2019-03-07 DIAGNOSIS — Z87891 Personal history of nicotine dependence: Secondary | ICD-10-CM | POA: Diagnosis not present

## 2019-03-07 NOTE — Progress Notes (Addendum)
Thoracic Location of Tumor / Histology: Limited stage (T2b, N1, M0) small cell lung cancer presented with left upper lobe with invasion of superior segment of the left lower lobe as well as left hilar lymphadenopathy diagnosed in March 2020.  Patient was being followed for mildly dilated ascending aorta.  Over the past 2 scans, an area in the left lower lobe adjacent to the descending thoracic aorta became visible and had enlarged in size over 8 months.  Because of this change, we then performed a PET scan which showed area of hypermetabolic activity adjacent to the aorta in the left lower lobe and also evidence of left hilar adenopathy that was hypermetabolic.   Biopsies of LLL lung mass (if applicable) revealed:   Tobacco/Marijuana/Snuff/ETOH use: yes, 1 ppd x 55 years. Stopped smoking in November 2018.  Past/Anticipated interventions by cardiothoracic surgery, if any: no  Past/Anticipated interventions by medical oncology, if any:  PRIOR THERAPY: None.  CURRENT THERAPY: Systemic chemotherapy with carboplatin for AUC of 5 on day 1 and etoposide 100 mg/M2 on days 1, 2 and 3 every 3 weeks.  Signs/Symptoms  Weight changes, if any: no  Respiratory complaints, if any: Denies a cough. Denies chest pain. Reports since starting chemotherapy he has noticed episodes of shortness of breath.  Hemoptysis, if any: no  Pain issues, if any:  Intermittent hip and low back pain managed with Tylenol.   SAFETY ISSUES:  Prior radiation? no  Pacemaker/ICD? no   Possible current pregnancy?no, male patient  Is the patient on methotrexate? no  Current Complaints / other details:  73 year old male. Married with 2 children. Retired.

## 2019-03-07 NOTE — Progress Notes (Signed)
Radiation Oncology         [phone] ________________________________  Initial Outpatient Consultation Conducted via WebEx due to current COVID-19 concerns for limiting patient exposure  Name: Jeremy Johnson MRN: [id number]  Date of Service: 03/07/2019 DOB: [birth year]  WC:HENID, Jeremy Manner, MD  Curt Bears, MD   REFERRING PHYSICIAN: Curt Bears, MD  DIAGNOSIS: 73 y.o. gentleman with Limited stage (T2b, N1, M0) small cell lung cancer of the left upper lobe with invasion of superior segment of the left lower lobe as well as left hilar lymphadenopathy diagnosed in March 2020.    ICD-10-CM   1. 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 male seen at the request of Dr. Julien Johnson. The patient was initially noted to have a dilated aortic arch on a low dose lung cancer screening CT obtained in 08/2017 due to his long smoking history. He was then referred to Dr. Servando Snare on 09/11/2017 for further evaluation/management and has been followed with serial chest CTs since that time.   Unfortunately, his most recent chest CT on 01/09/2019 incidentally showed an enlarging, 4 cm mass within the central left lung crossing the oblique fissure involving the posterior left upper lobe and superior segment of the left lower lobe, highly concerning for primary bronchogenic carcinoma. A PET scan was performed on 01/22/2019 for further evaluation and confirmed a hypermetabolic central left perihilar lung mass as well as hypermetabolic left hilar lymph node and mild to moderate increased uptake identified within the low left paratracheal lymph nodes, equivocal for metastatic adenopathy. There was no hypermetabolic contralateral mediastinal, subcarinal, or hilar lymph nodes and no evidence for distant metastatic disease.  The patient then underwent video assisted bronchoscopy with EBUS on 01/27/2019. Final pathology revealed atypical  pleural variation with overall features suspicious for malignancy, specifically small cell carcinoma. He met with Dr. Julien Johnson in consult on 02/06/2019, who recommended proceeding with concurrent chemoradiation.  He was started on systemic chemotherapy with carboplatin and etoposide every 3 weeks- first cycle on 02/17/2019.  He reports that he has tolerated chemotherapy well so far.  He underwent a brain MRI on 02/20/2019, which was negative for metastatic disease.  The patient reviewed the biopsy and scan results with his oncologist and he has kindly been referred today for discussion of potential radiation treatment options.  PREVIOUS RADIATION THERAPY: No  PAST MEDICAL HISTORY:  Past Medical History:  Diagnosis Date  . CAD (coronary artery disease)    STENT... MID CIRCUMFLEX...1997  . Chronic kidney disease    STAGE 3  . Degenerative joint disease (DJD) of lumbar spine   . GERD (gastroesophageal reflux disease)   . Gout   . Hyperlipidemia   . Hypertension   . Hypothyroidism   . Incisional hernia   . Leukocytosis    CHRONIC MILD  . Myocardial infarction [facility])    1997      PAST SURGICAL HISTORY: Past Surgical History:  Procedure Laterality Date  . ABDOMINAL AORTIC ANEURYSM REPAIR  2006  . BIOPSY  09/30/2018   Procedure: BIOPSY;  Surgeon: Jeremy Binder, MD;  [facility];  Service: Endoscopy;;  ascending colon  . COLONOSCOPY  2008  . COLONOSCOPY N/A 09/30/2018   Procedure: COLONOSCOPY;  Surgeon: Jeremy Binder, MD;  [facility];  Service: Endoscopy;  Laterality: N/A;  9:00  . CORONARY ANGIOPLASTY WITH STENT PLACEMENT  2008   MID CIRCUMFLEX  .  POLYPECTOMY  09/30/2018   Procedure: POLYPECTOMY;  Surgeon: Jeremy Binder, MD;  [facility];  Service: Endoscopy;;  colon  . VIDEO BRONCHOSCOPY WITH ENDOBRONCHIAL NAVIGATION N/A 01/27/2019   Procedure: VIDEO BRONCHOSCOPY WITH ENDOBRONCHIAL NAVIGATION;  Surgeon: Jeremy Isaac, MD;  Location: [place];   Service: Thoracic;  Laterality: N/A;  . VIDEO BRONCHOSCOPY WITH ENDOBRONCHIAL ULTRASOUND N/A 01/27/2019   Procedure: VIDEO BRONCHOSCOPY WITH ENDOBRONCHIAL ULTRASOUND;  Surgeon: Jeremy Isaac, MD;  Location: [place];  Service: Thoracic;  Laterality: N/A;    FAMILY HISTORY:  Family History  Problem Relation Age of Onset  . Stroke Brother   . Lung cancer Sister 9       lung cancer/former    SOCIAL HISTORY:  Social History   Socioeconomic History  . Marital status: Married    Spouse name: Not on file  . Number of children: 2  . Years of education: Not on file  . Highest education level: Not on file  Occupational History    Comment: retired  Scientific laboratory technician  . Financial resource strain: Not on file  . Food insecurity:    Worry: Not on file    Inability: Not on file  . Transportation needs:    Medical: Not on file    Non-medical: Not on file  Tobacco Use  . Smoking status: Former Smoker    Packs/day: 0.50    Years: 54.00    Pack years: 27.00    Last attempt to quit: 09/17/2017    Years since quitting: 1.4  . Smokeless tobacco: Never Used  Substance and Sexual Activity  . Alcohol use: Yes    Comment: occasional  . Drug use: No  . Sexual activity: Not Currently  Lifestyle  . Physical activity:    Days per week: Not on file    Minutes per session: Not on file  . Stress: Not on file  Relationships  . Social connections:    Talks on phone: Not on file    Gets together: Not on file    Attends religious service: Not on file    Active member of club or organization: Not on file    Attends meetings of clubs or organizations: Not on file    Relationship status: Not on file  . Intimate partner violence:    Fear of current or ex partner: Not on file    Emotionally abused: Not on file    Physically abused: Not on file    Forced sexual activity: Not on file  Other Topics Concern  . Not on file  Social History Narrative  . Not on file    ALLERGIES: Penicillins   MEDICATIONS:  Current Outpatient Medications  Medication Sig Dispense Refill  . allopurinol (ZYLOPRIM) 300 MG tablet Take 300 mg by mouth daily.    [name] amLODipine (NORVASC) 5 MG tablet Take 5 mg by mouth daily.     [name] aspirin EC 81 MG tablet Take 81 mg by mouth daily.    . calcitRIOL (ROCALTROL) 0.25 MCG capsule Take 0.25 mcg by mouth daily.     [name] levothyroxine (SYNTHROID, LEVOTHROID) 175 MCG tablet Take 175 mcg by mouth daily before breakfast.    . losartan-hydrochlorothiazide (HYZAAR) 100-25 MG tablet Take 1 tablet by mouth daily.    . metoprolol tartrate (LOPRESSOR) 50 MG tablet Take 25 mg by mouth 2 (two) times daily.     [name] omeprazole (PRILOSEC) 20 MG capsule Take 20 mg by mouth daily.    [name]  prochlorperazine (COMPAZINE) 10 MG tablet Take 1 tablet (10 mg total) by mouth every 6 (six) hours as needed for nausea or vomiting. 30 tablet 0  . simvastatin (ZOCOR) 20 MG tablet Take 20 mg by mouth daily.     . mometasone (ELOCON) 0.1 % ointment Apply 1 application topically 2 (two) times daily as needed (for eczema).   2   No current facility-administered medications for this encounter.     REVIEW OF SYSTEMS:  On review of systems, the patient reports that he is doing well overall. He denies any chest pain, cough, fevers, chills, night sweats, unintended weight changes. He reports some episodes of shortness of breath since starting chemotherapy. He denies any bowel or bladder disturbances, and denies abdominal pain, nausea or vomiting. He reports intermittent hip and low back pain, which he manages with Tylenol. A complete review of systems is obtained and is otherwise negative.    PHYSICAL EXAM:  Wt Readings from Last 3 Encounters:  03/07/19 200 lb (90.7 kg)  02/25/19 200 lb 8 oz (90.9 kg)  02/06/19 199 lb 4.8 oz (90.4 kg)   Temp Readings from Last 3 Encounters:  02/25/19 98.6 F (37 C) (Oral)  02/19/19 97.8 F (36.6 C) (Oral)  02/18/19 97.7 F (36.5 C) (Oral)   BP Readings from Last 3  Encounters:  02/25/19 136/69  02/19/19 132/71  02/18/19 132/77   Pulse Readings from Last 3 Encounters:  02/25/19 69  02/19/19 60  02/18/19 64   Pain Assessment Pain Score: 0-No pain/10  In general this is a well appearing Caucasian gentleman in no acute distress. He's alert and oriented x4 and appropriate throughout the examination. Cardiopulmonary assessment is negative for acute distress and he exhibits normal effort.    KPS = 90  100 - Normal; no complaints; no evidence of disease. 90   - Able to carry on normal activity; minor signs or symptoms of disease. 80   - Normal activity with effort; some signs or symptoms of disease. 55   - Cares for self; unable to carry on normal activity or to do active work. 60   - Requires occasional assistance, but is able to care for most of his personal needs. 50   - Requires considerable assistance and frequent medical care. 45   - Disabled; requires special care and assistance. 62   - Severely disabled; hospital admission is indicated although death not imminent. 53   - Very sick; hospital admission necessary; active supportive treatment necessary. 10   - Moribund; fatal processes progressing rapidly. 0     - Dead  Karnofsky DA, Abelmann [place], Craver LS and Burchenal [facility] [phone]) The use of the nitrogen mustards in the palliative treatment of carcinoma: with particular reference to bronchogenic carcinoma Cancer 1 634-56  LABORATORY DATA:  Lab Results  Component Value Date   WBC 4.1 03/03/2019   HGB 10.4 (L) 03/03/2019   HCT 30.7 (L) 03/03/2019   MCV 89.8 03/03/2019   PLT 121 (L) 03/03/2019   Lab Results  Component Value Date   NA 138 03/03/2019   K 4.7 03/03/2019   CL 107 03/03/2019   CO2 21 (L) 03/03/2019   Lab Results  Component Value Date   ALT 17 03/03/2019   AST 13 (L) 03/03/2019   ALKPHOS 113 03/03/2019   BILITOT 0.2 (L) 03/03/2019     RADIOGRAPHY: [name] JX Contrast  Result Date: 02/20/2019 CLINICAL DATA:   Non-small-cell lung cancer staging Creatinine was obtained on site  at [facility] at [address] Results: Creatinine 2.1 mg/dL. EXAM: MRI HEAD WITHOUT AND WITH CONTRAST TECHNIQUE: Multiplanar, multiecho pulse sequences of the brain and surrounding structures were obtained without and with intravenous contrast. CONTRAST:  69m MULTIHANCE GADOBENATE DIMEGLUMINE 529 MG/ML IV SOLN COMPARISON:  None. FINDINGS: Brain: Ventricle size normal. Negative for acute infarct. Minimal white matter hyperintensities bilaterally which do not enhance. No hemorrhage, mass or edema. No enhancing lesions are seen postcontrast administration. Negative for metastatic disease. Vascular: Normal arterial flow voids Skull and upper cervical spine: Negative Sinuses/Orbits: Minimal mucosal edema paranasal sinuses. Normal orbit Other: None IMPRESSION: No acute abnormality.  Negative for metastatic disease. Electronically Signed   By: CFranchot GalloM.D.   On: 02/20/2019 16:12      IMPRESSION/PLAN: 1. 73y.o. gentleman with Limited stage (T2b, N1, M0) small cell lung cancer of the left upper lobe with invasion of superior segment of the left lower lobe as well as left hilar lymphadenopathy diagnosed in March 2020.  Today, we talked to the patient and his wife about the findings and work-up thus far.  We discussed the natural history of limited stage small cell lung cancer and general treatment, highlighting the role of radiotherapy in the management.  We discussed the available radiation techniques, and focused on the details of logistics and delivery.  The recommendation is to proceed with a 6-1/2-week course of daily radiotherapy concurrent with systemic chemotherapy.  We reviewed the anticipated acute and late sequelae associated with radiation in this setting.   We also briefly discussed the role of prophylactic cranial irradiation (PCI) to reduce the risk of brain metastases in patients with limited stage small cell lung  cancer.  We discussed that once he has completed his concurrent chemoradiation, we will plan to repeat an MRI of the brain and if this scan remains stable/disease-free at that time, we would meet back together to give further consideration for moving forward with PCI.  The patient was encouraged to ask questions that were answered to his stated satisfaction.   At the end of our conversation, the patient elects to proceed with daily external beam radiation to the left lung and nodes concurrent with his cystemic chemotherapy. He is scheduled for CT simulation on Monday, 03/10/2019 at 8 am in anticipatation of beginning his daily radiation treatments on Monday 03/17/19.  He appears to have a good understanding of his disease and our recommendations which are to proceed with treatment of curative intent.  He is comfortable with and in agreement with the stated plan.  We will share this discussion with Dr. MEarlie Serverand move forward with treatment planning accordingly.  This encounter was provided by telemedicine platform Webex.  The patient was contacted ahead of time and has given verbal consent for this type of encounter.  He was advised to only accept a meeting of this type in a secure network environment. The time spent during this encounter was 60 minutes and 50% of that time was spent in the review of outside records and coordination of his care. The attendants for this meeting include MTyler PitaMD, Ashlyn Bruning PA-C, [place] and patient JZACHORY MANGUALand his wife.  During the encounter, MTyler PitaMD, Ashlyn Bruning PA-C, and KWilburn Mylar scribe [facility].  JDarrin Luisand his wife were located at home.      ANicholos Johns PA-C    MTyler Pita MD  [facility]  Radiation Oncology Direct Dial: [phone]  Fax: [phone] [email or web address]  Skype  [email or web address]   This document serves  as a record of services personally performed by Tyler Pita, MD and Freeman Caldron, PA-C. It was created on their behalf by Wilburn Mylar, a trained medical scribe. The creation of this record is based on the scribe's personal observations and the provider's statements to them. This document has been checked and approved by the attending provider.

## 2019-03-07 NOTE — Progress Notes (Signed)
See progress note under physician encounter. 

## 2019-03-10 ENCOUNTER — Encounter: Payer: Self-pay | Admitting: Physician Assistant

## 2019-03-10 ENCOUNTER — Inpatient Hospital Stay: Payer: Medicare Other

## 2019-03-10 ENCOUNTER — Inpatient Hospital Stay (HOSPITAL_BASED_OUTPATIENT_CLINIC_OR_DEPARTMENT_OTHER): Payer: Medicare Other | Admitting: Physician Assistant

## 2019-03-10 ENCOUNTER — Other Ambulatory Visit: Payer: Self-pay

## 2019-03-10 ENCOUNTER — Ambulatory Visit
Admission: RE | Admit: 2019-03-10 | Discharge: 2019-03-10 | Disposition: A | Discharge status: Home / Self Care | Payer: Medicare Other | Source: Ambulatory Visit | Attending: Radiation Oncology | Admitting: Radiation Oncology

## 2019-03-10 VITALS — BP 126/66 | HR 64 | Temp 97.8°F | Resp 18 | Ht 69.0 in | Wt 206.2 lb

## 2019-03-10 DIAGNOSIS — C3431 Malignant neoplasm of lower lobe, right bronchus or lung: Secondary | ICD-10-CM

## 2019-03-10 DIAGNOSIS — N183 Chronic kidney disease, stage 3 (moderate): Secondary | ICD-10-CM | POA: Diagnosis not present

## 2019-03-10 DIAGNOSIS — C3412 Malignant neoplasm of upper lobe, left bronchus or lung: Secondary | ICD-10-CM | POA: Diagnosis not present

## 2019-03-10 DIAGNOSIS — E785 Hyperlipidemia, unspecified: Secondary | ICD-10-CM

## 2019-03-10 DIAGNOSIS — M109 Gout, unspecified: Secondary | ICD-10-CM | POA: Diagnosis not present

## 2019-03-10 DIAGNOSIS — Z5111 Encounter for antineoplastic chemotherapy: Secondary | ICD-10-CM | POA: Diagnosis not present

## 2019-03-10 DIAGNOSIS — Z79899 Other long term (current) drug therapy: Secondary | ICD-10-CM | POA: Diagnosis not present

## 2019-03-10 DIAGNOSIS — I129 Hypertensive chronic kidney disease with stage 1 through stage 4 chronic kidney disease, or unspecified chronic kidney disease: Secondary | ICD-10-CM

## 2019-03-10 DIAGNOSIS — C3492 Malignant neoplasm of unspecified part of left bronchus or lung: Secondary | ICD-10-CM

## 2019-03-10 DIAGNOSIS — Z7982 Long term (current) use of aspirin: Secondary | ICD-10-CM

## 2019-03-10 DIAGNOSIS — C3411 Malignant neoplasm of upper lobe, right bronchus or lung: Secondary | ICD-10-CM

## 2019-03-10 DIAGNOSIS — Z510 Encounter for antineoplastic radiation therapy: Secondary | ICD-10-CM | POA: Diagnosis not present

## 2019-03-10 DIAGNOSIS — I252 Old myocardial infarction: Secondary | ICD-10-CM | POA: Diagnosis not present

## 2019-03-10 DIAGNOSIS — K5900 Constipation, unspecified: Secondary | ICD-10-CM | POA: Diagnosis not present

## 2019-03-10 DIAGNOSIS — Z5189 Encounter for other specified aftercare: Secondary | ICD-10-CM | POA: Diagnosis not present

## 2019-03-10 DIAGNOSIS — K219 Gastro-esophageal reflux disease without esophagitis: Secondary | ICD-10-CM | POA: Diagnosis not present

## 2019-03-10 DIAGNOSIS — R05 Cough: Secondary | ICD-10-CM | POA: Diagnosis not present

## 2019-03-10 DIAGNOSIS — E039 Hypothyroidism, unspecified: Secondary | ICD-10-CM | POA: Diagnosis not present

## 2019-03-10 LAB — CMP (CANCER CENTER ONLY)
ALT: 14 U/L (ref 0–44)
AST: 15 U/L (ref 15–41)
Albumin: 3.4 g/dL — ABNORMAL LOW (ref 3.5–5.0)
Alkaline Phosphatase: 112 U/L (ref 38–126)
Anion gap: 12 (ref 5–15)
BUN: 24 mg/dL — ABNORMAL HIGH (ref 8–23)
CO2: 22 mmol/L (ref 22–32)
Calcium: 9.2 mg/dL (ref 8.9–10.3)
Chloride: 106 mmol/L (ref 98–111)
Creatinine: 2.28 mg/dL — ABNORMAL HIGH (ref 0.61–1.24)
GFR, Est AFR Am: 32 mL/min — ABNORMAL LOW (ref >60)
GFR, Estimated: 28 mL/min — ABNORMAL LOW (ref >60)
Glucose, Bld: 105 mg/dL — ABNORMAL HIGH (ref 70–99)
Potassium: 4.2 mmol/L (ref 3.5–5.1)
Sodium: 140 mmol/L (ref 135–145)
Total Bilirubin: <0.2 mg/dL — ABNORMAL LOW (ref 0.3–1.2)
Total Protein: 7.4 g/dL (ref 6.5–8.1)

## 2019-03-10 LAB — CBC WITH DIFFERENTIAL (CANCER CENTER ONLY)
Abs Immature Granulocytes: 0.94 10*3/uL — ABNORMAL HIGH (ref 0.00–0.07)
Basophils Absolute: 0.1 10*3/uL (ref 0.0–0.1)
Basophils Relative: 1 %
Eosinophils Absolute: 0.1 10*3/uL (ref 0.0–0.5)
Eosinophils Relative: 1 %
HCT: 32.2 % — ABNORMAL LOW (ref 39.0–52.0)
Hemoglobin: 10.8 g/dL — ABNORMAL LOW (ref 13.0–17.0)
Immature Granulocytes: 9 %
Lymphocytes Relative: 36 %
Lymphs Abs: 4.1 10*3/uL — ABNORMAL HIGH (ref 0.7–4.0)
MCH: 30.9 pg (ref 26.0–34.0)
MCHC: 33.5 g/dL (ref 30.0–36.0)
MCV: 92 fL (ref 80.0–100.0)
Monocytes Absolute: 1.8 10*3/uL — ABNORMAL HIGH (ref 0.1–1.0)
Monocytes Relative: 16 %
Neutro Abs: 4 10*3/uL (ref 1.7–7.7)
Neutrophils Relative %: 37 %
Platelet Count: 389 10*3/uL (ref 150–400)
RBC: 3.5 MIL/uL — ABNORMAL LOW (ref 4.22–5.81)
RDW: 14.6 % (ref 11.5–15.5)
WBC Count: 11 10*3/uL — ABNORMAL HIGH (ref 4.0–10.5)
nRBC: 0.4 % — ABNORMAL HIGH (ref 0.0–0.2)

## 2019-03-10 MED ORDER — SODIUM CHLORIDE 0.9 % IV SOLN
80.0000 mg/m2 | Freq: Once | INTRAVENOUS | Status: AC
  Administered 2019-03-10: 170 mg via INTRAVENOUS
  Filled 2019-03-10: qty 8.5

## 2019-03-10 MED ORDER — SODIUM CHLORIDE 0.9 % IV SOLN
315.5000 mg | Freq: Once | INTRAVENOUS | Status: AC
  Administered 2019-03-10: 320 mg via INTRAVENOUS
  Filled 2019-03-10: qty 32

## 2019-03-10 MED ORDER — DEXAMETHASONE SODIUM PHOSPHATE 10 MG/ML IJ SOLN
10.0000 mg | Freq: Once | INTRAMUSCULAR | Status: AC
  Administered 2019-03-10: 10 mg via INTRAVENOUS

## 2019-03-10 MED ORDER — PALONOSETRON HCL INJECTION 0.25 MG/5ML
INTRAVENOUS | Status: AC
  Filled 2019-03-10: qty 5

## 2019-03-10 MED ORDER — DEXAMETHASONE SODIUM PHOSPHATE 10 MG/ML IJ SOLN
INTRAMUSCULAR | Status: AC
  Filled 2019-03-10: qty 1

## 2019-03-10 MED ORDER — SODIUM CHLORIDE 0.9 % IV SOLN
Freq: Once | INTRAVENOUS | Status: AC
  Administered 2019-03-10: 11:00:00 via INTRAVENOUS
  Filled 2019-03-10: qty 250

## 2019-03-10 MED ORDER — PALONOSETRON HCL INJECTION 0.25 MG/5ML
0.2500 mg | Freq: Once | INTRAVENOUS | Status: AC
  Administered 2019-03-10: 0.25 mg via INTRAVENOUS

## 2019-03-10 NOTE — Progress Notes (Signed)
  Radiation Oncology         [phone] ________________________________  Name: CRUE OTERO MRN: [id number]  Date: 03/10/2019  DOB: 03/06/1946  SIMULATION AND TREATMENT PLANNING NOTE    ICD-10-CM   1. Small cell lung cancer, left (HCC) C34.92     DIAGNOSIS:  73 yo man with limited stage small cell lung cancer of the left upper lung  NARRATIVE:  The patient was brought to the [facility].  Identity was confirmed.  All relevant records and images related to the planned course of therapy were reviewed.  The patient freely provided informed written consent to proceed with treatment after reviewing the details related to the planned course of therapy. The consent form was witnessed and verified by the simulation staff.  Then, the patient was set-up in a stable reproducible  supine position for radiation therapy.  CT images were obtained.  Surface markings were placed.  The CT images were loaded into the planning software.  Then the target and avoidance structures were contoured.  Treatment planning then occurred.  The radiation prescription was entered and confirmed.  Then, I designed and supervised the construction of a total of 6 medically necessary complex treatment devices, including a BodyFix immobilization mold custom fitted to the patient along with 5 multileaf collimators conformally shaped radiation around the treatment target while shielding critical structures such as the heart and spinal cord maximally.  I have requested : 3D Simulation  I have requested a DVH of the following structures: Left lung, right lung, spinal cord, heart, esophagus, and target.  I have ordered:Nutrition Consult  SPECIAL TREATMENT PROCEDURE:  The planned course of therapy using radiation constitutes a special treatment procedure. Special care is required in the management of this patient for the following reasons.  The patient will be receiving concurrent chemotherapy requiring careful monitoring  for increased toxicities of treatment including periodic laboratory values.  The special nature of the planned course of radiotherapy will require increased physician supervision and oversight to ensure patient's safety with optimal treatment outcomes.  PLAN:  The patient will receive 59.4 Gy in 33 fractions.  ________________________________  Sheral Apley Tammi Klippel, M.D.

## 2019-03-10 NOTE — Progress Notes (Signed)
Per Cassie Heilingoepter, PA ok to treat with creatinine 2.28.

## 2019-03-10 NOTE — Progress Notes (Signed)
[facility] OFFICE PROGRESS NOTE  Jeremy Noble, MD [address] [place] [address]  DIAGNOSIS: Limited stage (T2b, N1, M0) small cell lung cancer presented with right upper lobe with invasion of superior segment of right lower lobe as well as right hilar lymphadenopathy diagnosed in March 2020.  PRIOR THERAPY: None  CURRENT THERAPY: Systemic chemotherapy with carboplatin for AUC of 5 on day 1 and etoposide 80 mg/M2 on days 1, 2 and 3 every 3 weeks. First treatment 02/17/2019. Status post 1 cycle.   INTERVAL HISTORY: Jeremy Johnson 73 y.o. male returns to the clinic for a follow-up visit.  The patient is feeling well today without any concerning complaints.  The patient recently completed his first cycle of treatment and tolerated it well without any adverse effects.  He denies any fevers, chills, night sweats, or weight loss.  He denies any chest pain, cough, or hemoptysis.  He does endorse some baseline shortness of breath with exertion.  He denies any nausea, vomiting, or diarrhea.  He does report some constipation which is managed with prune juice and milk of magnesia.  He denies any headaches or visual changes.  He is is going to start his radiation treatment next week on Monday, May 4.  He is here today for evaluation prior to starting treatment with cycle #2.  MEDICAL HISTORY: Past Medical History:  Diagnosis Date  . CAD (coronary artery disease)    STENT... MID CIRCUMFLEX...1997  . Chronic kidney disease    STAGE 3  . Degenerative joint disease (DJD) of lumbar spine   . GERD (gastroesophageal reflux disease)   . Gout   . Hyperlipidemia   . Hypertension   . Hypothyroidism   . Incisional hernia   . Leukocytosis    CHRONIC MILD  . Myocardial infarction ([place])    1997    ALLERGIES:  is allergic to penicillins.  MEDICATIONS:  Current Outpatient Medications  Medication Sig Dispense Refill  . allopurinol (ZYLOPRIM) 300 MG tablet Take 300 mg by mouth  daily.    [name] amLODipine (NORVASC) 5 MG tablet Take 5 mg by mouth daily.     [name] aspirin EC 81 MG tablet Take 81 mg by mouth daily.    . calcitRIOL (ROCALTROL) 0.25 MCG capsule Take 0.25 mcg by mouth daily.     [name] levothyroxine (SYNTHROID, LEVOTHROID) 175 MCG tablet Take 175 mcg by mouth daily before breakfast.    . losartan-hydrochlorothiazide (HYZAAR) 100-25 MG tablet Take 1 tablet by mouth daily.    . metoprolol tartrate (LOPRESSOR) 50 MG tablet Take 25 mg by mouth 2 (two) times daily.     . mometasone (ELOCON) 0.1 % ointment Apply 1 application topically 2 (two) times daily as needed (for eczema).   2  . omeprazole (PRILOSEC) 20 MG capsule Take 20 mg by mouth daily.    . prochlorperazine (COMPAZINE) 10 MG tablet Take 1 tablet (10 mg total) by mouth every 6 (six) hours as needed for nausea or vomiting. 30 tablet 0  . simvastatin (ZOCOR) 20 MG tablet Take 20 mg by mouth daily.      No current facility-administered medications for this visit.     SURGICAL HISTORY:  Past Surgical History:  Procedure Laterality Date  . ABDOMINAL AORTIC ANEURYSM REPAIR  2006  . BIOPSY  09/30/2018   Procedure: BIOPSY;  Surgeon: Danie Binder, MD;  [facility];  Service: Endoscopy;;  ascending colon  . COLONOSCOPY  2008  . COLONOSCOPY N/A  09/30/2018   Procedure: COLONOSCOPY;  Surgeon: Danie Binder, MD;  [facility];  Service: Endoscopy;  Laterality: N/A;  9:00  . CORONARY ANGIOPLASTY WITH STENT PLACEMENT  2008   MID CIRCUMFLEX  . POLYPECTOMY  09/30/2018   Procedure: POLYPECTOMY;  Surgeon: Danie Binder, MD;  Location: [facility];  Service: Endoscopy;;  colon  . VIDEO BRONCHOSCOPY WITH ENDOBRONCHIAL NAVIGATION N/A 01/27/2019   Procedure: VIDEO BRONCHOSCOPY WITH ENDOBRONCHIAL NAVIGATION;  Surgeon: Grace Isaac, MD;  Location: [facility];  Service: Thoracic;  Laterality: N/A;  . VIDEO BRONCHOSCOPY WITH ENDOBRONCHIAL ULTRASOUND N/A 01/27/2019   Procedure: VIDEO BRONCHOSCOPY WITH  ENDOBRONCHIAL ULTRASOUND;  Surgeon: Grace Isaac, MD;  Location: [facility];  Service: Thoracic;  Laterality: N/A;    REVIEW OF SYSTEMS:   Review of Systems  Constitutional: Negative for appetite change, chills, fatigue, fever and unexpected weight change.  HENT:   Negative for mouth sores, nosebleeds, sore throat and trouble swallowing.   Eyes: Negative for eye problems and icterus.  Respiratory: Positive for shortness of breath with exertion. Negative for cough, hemoptysis, shortness of breath and wheezing.   Cardiovascular: Negative for chest pain and leg swelling.  Gastrointestinal: Positive for constipation. Negative for abdominal pain, diarrhea, nausea and vomiting.  Genitourinary: Negative for bladder incontinence, difficulty urinating, dysuria, frequency and hematuria.   Musculoskeletal: Negative for back pain, gait problem, neck pain and neck stiffness.  Skin: Negative for itching and rash.  Neurological: Negative for dizziness, extremity weakness, gait problem, headaches, light-headedness and seizures.  Hematological: Negative for adenopathy. Does not bruise/bleed easily.  Psychiatric/Behavioral: Negative for confusion, depression and sleep disturbance. The patient is not nervous/anxious.     PHYSICAL EXAMINATION:  Blood pressure 126/66, pulse 64, temperature 97.8 F (36.6 C), temperature source Oral, resp. rate 18, height 5\' 9"  (1.753 m), weight 206 lb 3.2 oz (93.5 kg), SpO2 100 %.  ECOG PERFORMANCE STATUS: 1 - Symptomatic but completely ambulatory  Physical Exam  Constitutional: Oriented to person, place, and time and well-developed, well-nourished, and in no distress.  HENT:  Head: Normocephalic and atraumatic.  Mouth/Throat: Oropharynx is clear and moist. No oropharyngeal exudate.  Eyes: Conjunctivae are normal. Right eye exhibits no discharge. Left eye exhibits no discharge. No scleral icterus.  Neck: Normal range of motion. Neck supple.  Cardiovascular: Normal rate,  regular rhythm, normal heart sounds and intact distal pulses.   Pulmonary/Chest: Effort normal and breath sounds normal. No respiratory distress. No wheezes. No rales.  Abdominal: Incisional hernia noted in the epigastric region. Soft. Bowel sounds are normal. Exhibits no other distension and no mass. There is no tenderness.  Musculoskeletal: Normal range of motion. Exhibits no edema.  Lymphadenopathy:    No cervical adenopathy.  Neurological: Alert and oriented to person, place, and time. Exhibits normal muscle tone. Gait normal. Coordination normal.  Skin: Skin is warm and dry. No rash noted. Not diaphoretic. No erythema. No pallor.  Psychiatric: Mood, memory and judgment normal.  Vitals reviewed.  LABORATORY DATA: Lab Results  Component Value Date   WBC 11.0 (H) 03/10/2019   HGB 10.8 (L) 03/10/2019   HCT 32.2 (L) 03/10/2019   MCV 92.0 03/10/2019   PLT 389 03/10/2019      Chemistry      Component Value Date/Time   NA 140 03/10/2019 0920   K 4.2 03/10/2019 0920   CL 106 03/10/2019 0920   CO2 22 03/10/2019 0920   BUN 24 (H) 03/10/2019 0920   CREATININE 2.28 (H) 03/10/2019 0920  Component Value Date/Time   CALCIUM 9.2 03/10/2019 0920   ALKPHOS 112 03/10/2019 0920   AST 15 03/10/2019 0920   ALT 14 03/10/2019 0920   BILITOT <0.2 (L) 03/10/2019 0920       RADIOGRAPHIC STUDIES:  Mr Jeri Cos GN Contrast  Result Date: 02/20/2019 CLINICAL DATA:  Non-small-cell lung cancer staging Creatinine was obtained on site at [facility] at [address] Results: Creatinine 2.1 mg/dL. EXAM: MRI HEAD WITHOUT AND WITH CONTRAST TECHNIQUE: Multiplanar, multiecho pulse sequences of the brain and surrounding structures were obtained without and with intravenous contrast. CONTRAST:  61mL MULTIHANCE GADOBENATE DIMEGLUMINE 529 MG/ML IV SOLN COMPARISON:  None. FINDINGS: Brain: Ventricle size normal. Negative for acute infarct. Minimal white matter hyperintensities bilaterally which do  not enhance. No hemorrhage, mass or edema. No enhancing lesions are seen postcontrast administration. Negative for metastatic disease. Vascular: Normal arterial flow voids Skull and upper cervical spine: Negative Sinuses/Orbits: Minimal mucosal edema paranasal sinuses. Normal orbit Other: None IMPRESSION: No acute abnormality.  Negative for metastatic disease. Electronically Signed   By: Franchot Gallo M.D.   On: 02/20/2019 16:12     ASSESSMENT/PLAN:  This is a very pleasant 73 year old Caucasian male with limited stage small cell lung cancer.  He presented with a right upper lobe mass with invasion of the superior segment of the right lower lobe as well as right hilar lymphadenopathy.  He was diagnosed in March 2020.   He is currently undergoing treatment with carboplatin for an AUC of 5 on day 1 and etoposide 80 mg/m2 days 1, 2, and 3 every 3 weeks.  He is status post 1 cycle.  He tolerated treatment well without any adverse effects.  The patient was seen with Dr. Julien Nordmann today.  He recommend that he proceed with cycle #2 as scheduled. I will arrange for restaging CT scan of the chest to be performed prior to his next visit.  We will order the scan without contrast due to his chronic kidney disease.   We will see him back for follow-up visit in 3 weeks for evaluation and to review his scan results before starting cycle #3.  The patient was advised to call immediately if he has any concerning symptoms in the interval. The patient voices understanding of current disease status and treatment options and is in agreement with the current care plan. All questions were answered. The patient knows to call the clinic with any problems, questions or concerns. We can certainly see the patient much sooner if necessary   Orders Placed This Encounter  Procedures  . CT Chest Wo Contrast    Please schedule at [facility] 03/27/2019    Standing Status:   Future    Standing Expiration Date:   03/09/2020     Scheduling Instructions:     Please schedule at [facility] 03/27/2019    Order Specific Question:   ** REASON FOR EXAM (FREE TEXT)    Answer:   Restaging CT scan    Order Specific Question:   Preferred imaging location?    Answer:   [facility]    Order Specific Question:   Radiology Contrast Protocol - do NOT remove file path    Answer:   [email or web address]      L , PA-C 03/10/19  ADDENDUM: Hematology/Oncology Attending: I had a face-to-face encounter with the patient today.  I recommended his care plan.  This is a very pleasant 73 years old white male with limited stage small cell  lung cancer diagnosed in March 2020 and currently undergoing systemic chemotherapy with carboplatin and etoposide status post 1 cycle.  The patient tolerated the first cycle of his treatment well with no concerning adverse effects. I recommended for him to proceed with cycle #2 today as scheduled. I will see the patient back for follow-up visit in 3 weeks for evaluation with repeat CT scan of the chest without contrast for restaging of his disease. The patient was advised to call immediately if he has any concerning symptoms in the interval time.  Disclaimer: This note was dictated with voice recognition software. Similar sounding words can inadvertently be transcribed and may be missed upon review. Eilleen Kempf, MD 03/10/19

## 2019-03-10 NOTE — Patient Instructions (Signed)
[facility] Discharge Instructions for Patients Receiving Chemotherapy  Today you received the following chemotherapy agents Carboplatin and Etoposide  To help prevent nausea and vomiting after your treatment, we encourage you to take your nausea medication as directed   If you develop nausea and vomiting that is not controlled by your nausea medication, call the clinic.   BELOW ARE SYMPTOMS THAT SHOULD BE REPORTED IMMEDIATELY:  *FEVER GREATER THAN 100.5 F  *CHILLS WITH OR WITHOUT FEVER  NAUSEA AND VOMITING THAT IS NOT CONTROLLED WITH YOUR NAUSEA MEDICATION  *UNUSUAL SHORTNESS OF BREATH  *UNUSUAL BRUISING OR BLEEDING  TENDERNESS IN MOUTH AND THROAT WITH OR WITHOUT PRESENCE OF ULCERS  *URINARY PROBLEMS  *BOWEL PROBLEMS  UNUSUAL RASH Items with * indicate a potential emergency and should be followed up as soon as possible.  Feel free to call the clinic should you have any questions or concerns. The clinic phone number is [phone].  Please show the [facility] at check-in to the Emergency Department and triage nurse.  Carboplatin injection What is this medicine? CARBOPLATIN (KAR boe pla tin) is a chemotherapy drug. It targets fast dividing cells, like cancer cells, and causes these cells to die. This medicine is used to treat ovarian cancer and many other cancers. This medicine may be used for other purposes; ask your health care provider or pharmacist if you have questions. COMMON BRAND NAME(S): Paraplatin What should I tell my health care provider before I take this medicine? They need to know if you have any of these conditions: -blood disorders -hearing problems -kidney disease -recent or ongoing radiation therapy -an unusual or allergic reaction to carboplatin, cisplatin, other chemotherapy, other medicines, foods, dyes, or preservatives -pregnant or trying to get pregnant -breast-feeding How should I use this medicine? This drug is usually  given as an infusion into a vein. It is administered in a hospital or clinic by a specially trained health care professional. Talk to your pediatrician regarding the use of this medicine in children. Special care may be needed. Overdosage: If you think you have taken too much of this medicine contact a poison control center or emergency room at once. NOTE: This medicine is only for you. Do not share this medicine with others. What if I miss a dose? It is important not to miss a dose. Call your doctor or health care professional if you are unable to keep an appointment. What may interact with this medicine? -medicines for seizures -medicines to increase blood counts like filgrastim, pegfilgrastim, sargramostim -some antibiotics like amikacin, gentamicin, neomycin, streptomycin, tobramycin -vaccines Talk to your doctor or health care professional before taking any of these medicines: -acetaminophen -aspirin -ibuprofen -ketoprofen -naproxen This list may not describe all possible interactions. Give your health care provider a list of all the medicines, herbs, non-prescription drugs, or dietary supplements you use. Also tell them if you smoke, drink alcohol, or use illegal drugs. Some items may interact with your medicine. What should I watch for while using this medicine? Your condition will be monitored carefully while you are receiving this medicine. You will need important blood work done while you are taking this medicine. This drug may make you feel generally unwell. This is not uncommon, as chemotherapy can affect healthy cells as well as cancer cells. Report any side effects. Continue your course of treatment even though you feel ill unless your doctor tells you to stop. In some cases, you may be given additional medicines to help with side effects.  Follow all directions for their use. Call your doctor or health care professional for advice if you get a fever, chills or sore throat, or  other symptoms of a cold or flu. Do not treat yourself. This drug decreases your body's ability to fight infections. Try to avoid being around people who are sick. This medicine may increase your risk to bruise or bleed. Call your doctor or health care professional if you notice any unusual bleeding. Be careful brushing and flossing your teeth or using a toothpick because you may get an infection or bleed more easily. If you have any dental work done, tell your dentist you are receiving this medicine. Avoid taking products that contain aspirin, acetaminophen, ibuprofen, naproxen, or ketoprofen unless instructed by your doctor. These medicines may hide a fever. Do not become pregnant while taking this medicine. Women should inform their doctor if they wish to become pregnant or think they might be pregnant. There is a potential for serious side effects to an unborn child. Talk to your health care professional or pharmacist for more information. Do not breast-feed an infant while taking this medicine. What side effects may I notice from receiving this medicine? Side effects that you should report to your doctor or health care professional as soon as possible: -allergic reactions like skin rash, itching or hives, swelling of the face, lips, or tongue -signs of infection - fever or chills, cough, sore throat, pain or difficulty passing urine -signs of decreased platelets or bleeding - bruising, pinpoint red spots on the skin, black, tarry stools, nosebleeds -signs of decreased red blood cells - unusually weak or tired, fainting spells, lightheadedness -breathing problems -changes in hearing -changes in vision -chest pain -high blood pressure -low blood counts - This drug may decrease the number of white blood cells, red blood cells and platelets. You may be at increased risk for infections and bleeding. -nausea and vomiting -pain, swelling, redness or irritation at the injection site -pain, tingling,  numbness in the hands or feet -problems with balance, talking, walking -trouble passing urine or change in the amount of urine Side effects that usually do not require medical attention (report to your doctor or health care professional if they continue or are bothersome): -hair loss -loss of appetite -metallic taste in the mouth or changes in taste This list may not describe all possible side effects. Call your doctor for medical advice about side effects. You may report side effects to FDA at 1-800-FDA-1088. Where should I keep my medicine? This drug is given in a hospital or clinic and will not be stored at home. NOTE: This sheet is a summary. It may not cover all possible information. If you have questions about this medicine, talk to your doctor, pharmacist, or health care provider.  2019 Elsevier/Gold Standard (2008-02-04 14:38:05)  Etoposide, VP-16 capsules What is this medicine? ETOPOSIDE, VP-16 (e toe POE side) is a chemotherapy drug. It is used to treat small cell lung cancer and other cancers. This medicine may be used for other purposes; ask your health care provider or pharmacist if you have questions. COMMON BRAND NAME(S): VePesid What should I tell my health care provider before I take this medicine? They need to know if you have any of these conditions: -infection -kidney disease -liver disease -low blood counts, like low white cell, platelet, or red cell counts -an unusual or allergic reaction to etoposide, other medicines, foods, dyes, or preservatives -pregnant or trying to get pregnant -breast-feeding How should I use this  medicine? Take this medicine by mouth with a glass of water. Follow the directions on the prescription label. Do not open, crush, or chew the capsules. It is advisable to wear gloves when handling this medicine. Take your medicine at regular intervals. Do not take it more often than directed. Do not stop taking except on your doctor's advice. Talk  to your pediatrician regarding the use of this medicine in children. Special care may be needed. Overdosage: If you think you have taken too much of this medicine contact a poison control center or emergency room at once. NOTE: This medicine is only for you. Do not share this medicine with others. What if I miss a dose? If you miss a dose, take it as soon as you can. If it is almost time for your next dose, take only that dose. Do not take double or extra doses. What may interact with this medicine? -aspirin -certain medications for seizures like carbamazepine, phenobarbital, phenytoin, valproic acid -cyclosporine -levamisole -valproic acid -warfarin This list may not describe all possible interactions. Give your health care provider a list of all the medicines, herbs, non-prescription drugs, or dietary supplements you use. Also tell them if you smoke, drink alcohol, or use illegal drugs. Some items may interact with your medicine. What should I watch for while using this medicine? Visit your doctor for checks on your progress. This drug may make you feel generally unwell. This is not uncommon, as chemotherapy can affect healthy cells as well as cancer cells. Report any side effects. Continue your course of treatment even though you feel ill unless your doctor tells you to stop. In some cases, you may be given additional medicines to help with side effects. Follow all directions for their use. Call your doctor or health care professional for advice if you get a fever, chills or sore throat, or other symptoms of a cold or flu. Do not treat yourself. This drug decreases your body's ability to fight infections. Try to avoid being around people who are sick. This medicine may increase your risk to bruise or bleed. Call your doctor or health care professional if you notice any unusual bleeding. Talk to your doctor about your risk of cancer. You may be more at risk for certain types of cancers if you  take this medicine. Do not become pregnant while taking this medicine or for at least 6 months after stopping it. Women should inform their doctor if they wish to become pregnant or think they might be pregnant. Women of child-bearing potential will need to have a negative pregnancy test before starting this medicine. There is a potential for serious side effects to an unborn child. Talk to your health care professional or pharmacist for more information. Do not breast-feed an infant while taking this medicine. Men must use a latex condom during sexual contact with a woman while taking this medicine and for at least 4 months after stopping it. A latex condom is needed even if you have had a vasectomy. Contact your doctor right away if your partner becomes pregnant. Do not donate sperm while taking this medicine and for 4 months after you stop taking this medicine. Men should inform their doctors if they wish to father a child. This medicine may lower sperm counts. What side effects may I notice from receiving this medicine? Side effects that you should report to your doctor or health care professional as soon as possible: -allergic reactions like skin rash, itching or hives, swelling of the  face, lips, or tongue -low blood counts - this medicine may decrease the number of white blood cells, red blood cells and platelets. You may be at increased risk for infections and bleeding. -signs of infection - fever or chills, cough, sore throat, pain or difficulty passing urine -signs of decreased platelets or bleeding - bruising, pinpoint red spots on the skin, black, tarry stools, blood in the urine -signs of decreased red blood cells - unusually weak or tired, fainting spells, lightheadedness -breathing problems -changes in vision -mouth or throat sores or ulcers -pain, tingling, numbness in the hands or feet -redness, blistering, peeling or loosening of the skin, including inside the  mouth -seizures -vomiting Side effects that usually do not require medical attention (report to your doctor or health care professional if they continue or are bothersome): -change in taste -diarrhea -hair loss -nausea -stomach pain This list may not describe all possible side effects. Call your doctor for medical advice about side effects. You may report side effects to FDA at 1-800-FDA-1088. Where should I keep my medicine? Keep out of the reach of children. Store in a refrigerator between 2 and 8 degrees C (36 and 46 degrees F). Do not freeze. Throw away any unused medicine after the expiration date. NOTE: This sheet is a summary. It may not cover all possible information. If you have questions about this medicine, talk to your doctor, pharmacist, or health care provider.  2019 Elsevier/Gold Standard (2015-10-22 11:49:52)

## 2019-03-11 ENCOUNTER — Other Ambulatory Visit: Payer: Self-pay

## 2019-03-11 ENCOUNTER — Inpatient Hospital Stay: Payer: Medicare Other

## 2019-03-11 VITALS — BP 128/69 | HR 65 | Temp 97.8°F | Resp 20

## 2019-03-11 DIAGNOSIS — E785 Hyperlipidemia, unspecified: Secondary | ICD-10-CM | POA: Diagnosis not present

## 2019-03-11 DIAGNOSIS — E039 Hypothyroidism, unspecified: Secondary | ICD-10-CM | POA: Diagnosis not present

## 2019-03-11 DIAGNOSIS — I252 Old myocardial infarction: Secondary | ICD-10-CM | POA: Diagnosis not present

## 2019-03-11 DIAGNOSIS — Z79899 Other long term (current) drug therapy: Secondary | ICD-10-CM | POA: Diagnosis not present

## 2019-03-11 DIAGNOSIS — K219 Gastro-esophageal reflux disease without esophagitis: Secondary | ICD-10-CM | POA: Diagnosis not present

## 2019-03-11 DIAGNOSIS — C3431 Malignant neoplasm of lower lobe, right bronchus or lung: Secondary | ICD-10-CM | POA: Diagnosis not present

## 2019-03-11 DIAGNOSIS — C3411 Malignant neoplasm of upper lobe, right bronchus or lung: Secondary | ICD-10-CM | POA: Diagnosis not present

## 2019-03-11 DIAGNOSIS — I129 Hypertensive chronic kidney disease with stage 1 through stage 4 chronic kidney disease, or unspecified chronic kidney disease: Secondary | ICD-10-CM | POA: Diagnosis not present

## 2019-03-11 DIAGNOSIS — Z5189 Encounter for other specified aftercare: Secondary | ICD-10-CM | POA: Diagnosis not present

## 2019-03-11 DIAGNOSIS — R05 Cough: Secondary | ICD-10-CM | POA: Diagnosis not present

## 2019-03-11 DIAGNOSIS — N183 Chronic kidney disease, stage 3 (moderate): Secondary | ICD-10-CM | POA: Diagnosis not present

## 2019-03-11 DIAGNOSIS — Z7982 Long term (current) use of aspirin: Secondary | ICD-10-CM | POA: Diagnosis not present

## 2019-03-11 DIAGNOSIS — M109 Gout, unspecified: Secondary | ICD-10-CM | POA: Diagnosis not present

## 2019-03-11 DIAGNOSIS — C3492 Malignant neoplasm of unspecified part of left bronchus or lung: Secondary | ICD-10-CM

## 2019-03-11 DIAGNOSIS — Z5111 Encounter for antineoplastic chemotherapy: Secondary | ICD-10-CM | POA: Diagnosis not present

## 2019-03-11 MED ORDER — SODIUM CHLORIDE 0.9 % IV SOLN
80.0000 mg/m2 | Freq: Once | INTRAVENOUS | Status: AC
  Administered 2019-03-11: 170 mg via INTRAVENOUS
  Filled 2019-03-11: qty 8.5

## 2019-03-11 MED ORDER — SODIUM CHLORIDE 0.9 % IV SOLN
Freq: Once | INTRAVENOUS | Status: AC
  Administered 2019-03-11: 09:00:00 via INTRAVENOUS
  Filled 2019-03-11: qty 250

## 2019-03-11 MED ORDER — DEXAMETHASONE SODIUM PHOSPHATE 10 MG/ML IJ SOLN
10.0000 mg | Freq: Once | INTRAMUSCULAR | Status: AC
  Administered 2019-03-11: 10 mg via INTRAVENOUS

## 2019-03-11 MED ORDER — DEXAMETHASONE SODIUM PHOSPHATE 10 MG/ML IJ SOLN
INTRAMUSCULAR | Status: AC
  Filled 2019-03-11: qty 1

## 2019-03-12 ENCOUNTER — Inpatient Hospital Stay: Payer: Medicare Other

## 2019-03-12 ENCOUNTER — Other Ambulatory Visit: Payer: Self-pay

## 2019-03-12 VITALS — BP 136/76 | HR 67 | Temp 98.1°F | Resp 18

## 2019-03-12 DIAGNOSIS — E039 Hypothyroidism, unspecified: Secondary | ICD-10-CM | POA: Diagnosis not present

## 2019-03-12 DIAGNOSIS — Z79899 Other long term (current) drug therapy: Secondary | ICD-10-CM | POA: Diagnosis not present

## 2019-03-12 DIAGNOSIS — C3412 Malignant neoplasm of upper lobe, left bronchus or lung: Secondary | ICD-10-CM | POA: Diagnosis not present

## 2019-03-12 DIAGNOSIS — Z5189 Encounter for other specified aftercare: Secondary | ICD-10-CM | POA: Diagnosis not present

## 2019-03-12 DIAGNOSIS — C3411 Malignant neoplasm of upper lobe, right bronchus or lung: Secondary | ICD-10-CM | POA: Diagnosis not present

## 2019-03-12 DIAGNOSIS — Z7982 Long term (current) use of aspirin: Secondary | ICD-10-CM | POA: Diagnosis not present

## 2019-03-12 DIAGNOSIS — E785 Hyperlipidemia, unspecified: Secondary | ICD-10-CM | POA: Diagnosis not present

## 2019-03-12 DIAGNOSIS — M109 Gout, unspecified: Secondary | ICD-10-CM | POA: Diagnosis not present

## 2019-03-12 DIAGNOSIS — I129 Hypertensive chronic kidney disease with stage 1 through stage 4 chronic kidney disease, or unspecified chronic kidney disease: Secondary | ICD-10-CM | POA: Diagnosis not present

## 2019-03-12 DIAGNOSIS — N183 Chronic kidney disease, stage 3 (moderate): Secondary | ICD-10-CM | POA: Diagnosis not present

## 2019-03-12 DIAGNOSIS — Z510 Encounter for antineoplastic radiation therapy: Secondary | ICD-10-CM | POA: Diagnosis not present

## 2019-03-12 DIAGNOSIS — Z5111 Encounter for antineoplastic chemotherapy: Secondary | ICD-10-CM | POA: Diagnosis not present

## 2019-03-12 DIAGNOSIS — I252 Old myocardial infarction: Secondary | ICD-10-CM | POA: Diagnosis not present

## 2019-03-12 DIAGNOSIS — C3431 Malignant neoplasm of lower lobe, right bronchus or lung: Secondary | ICD-10-CM | POA: Diagnosis not present

## 2019-03-12 DIAGNOSIS — C3492 Malignant neoplasm of unspecified part of left bronchus or lung: Secondary | ICD-10-CM

## 2019-03-12 DIAGNOSIS — K219 Gastro-esophageal reflux disease without esophagitis: Secondary | ICD-10-CM | POA: Diagnosis not present

## 2019-03-12 DIAGNOSIS — R05 Cough: Secondary | ICD-10-CM | POA: Diagnosis not present

## 2019-03-12 MED ORDER — SODIUM CHLORIDE 0.9 % IV SOLN
80.0000 mg/m2 | Freq: Once | INTRAVENOUS | Status: AC
  Administered 2019-03-12: 170 mg via INTRAVENOUS
  Filled 2019-03-12: qty 8.5

## 2019-03-12 MED ORDER — PEGFILGRASTIM 6 MG/0.6ML ~~LOC~~ PSKT
PREFILLED_SYRINGE | SUBCUTANEOUS | Status: AC
  Filled 2019-03-12: qty 0.6

## 2019-03-12 MED ORDER — SODIUM CHLORIDE 0.9 % IV SOLN
Freq: Once | INTRAVENOUS | Status: AC
  Administered 2019-03-12: 09:00:00 via INTRAVENOUS
  Filled 2019-03-12: qty 250

## 2019-03-12 MED ORDER — DEXAMETHASONE SODIUM PHOSPHATE 10 MG/ML IJ SOLN
10.0000 mg | Freq: Once | INTRAMUSCULAR | Status: AC
  Administered 2019-03-12: 10 mg via INTRAVENOUS

## 2019-03-12 MED ORDER — DEXAMETHASONE SODIUM PHOSPHATE 10 MG/ML IJ SOLN
INTRAMUSCULAR | Status: AC
  Filled 2019-03-12: qty 1

## 2019-03-12 MED ORDER — PEGFILGRASTIM 6 MG/0.6ML ~~LOC~~ PSKT
6.0000 mg | PREFILLED_SYRINGE | Freq: Once | SUBCUTANEOUS | Status: AC
  Administered 2019-03-12: 6 mg via SUBCUTANEOUS

## 2019-03-12 NOTE — Patient Instructions (Signed)
[facility] Discharge Instructions for Patients Receiving Chemotherapy  Today you received the following chemotherapy agents Etoposide (VEPESID).  To help prevent nausea and vomiting after your treatment, we encourage you to take your nausea medication as prescribed.   If you develop nausea and vomiting that is not controlled by your nausea medication, call the clinic.   BELOW ARE SYMPTOMS THAT SHOULD BE REPORTED IMMEDIATELY:  *FEVER GREATER THAN 100.5 F  *CHILLS WITH OR WITHOUT FEVER  NAUSEA AND VOMITING THAT IS NOT CONTROLLED WITH YOUR NAUSEA MEDICATION  *UNUSUAL SHORTNESS OF BREATH  *UNUSUAL BRUISING OR BLEEDING  TENDERNESS IN MOUTH AND THROAT WITH OR WITHOUT PRESENCE OF ULCERS  *URINARY PROBLEMS  *BOWEL PROBLEMS  UNUSUAL RASH Items with * indicate a potential emergency and should be followed up as soon as possible.  Feel free to call the clinic should you have any questions or concerns. The clinic phone number is [phone].  Please show the [facility] at check-in to the Emergency Department and triage nurse.  Coronavirus (COVID-19) Are you at risk?  Are you at risk for the Coronavirus (COVID-19)?  To be considered HIGH RISK for Coronavirus (COVID-19), you have to meet the following criteria:  . Traveled to [place], [place], [place], [place] or [place]; or in the [place] to [place], [place], [place], or [place]; and have fever, cough, and shortness of breath within the last 2 weeks of travel OR . Been in close contact with a person diagnosed with COVID-19 within the last 2 weeks and have fever, cough, and shortness of breath . IF YOU DO NOT MEET THESE CRITERIA, YOU ARE CONSIDERED LOW RISK FOR COVID-19.  What to do if you are HIGH RISK for COVID-19?  [name] If you are having a medical emergency, call 911. . Seek medical care right away. Before you go to a doctor's office, urgent care or emergency department, call ahead and tell them  about your recent travel, contact with someone diagnosed with COVID-19, and your symptoms. You should receive instructions from your physician's office regarding next steps of care.  . When you arrive at healthcare provider, tell the healthcare staff immediately you have returned from visiting [place], [place], [place], [place] or [place]; or traveled in the [place] to [place], [place], [place], or [place]; in the last two weeks or you have been in close contact with a person diagnosed with COVID-19 in the last 2 weeks.   . Tell the health care staff about your symptoms: fever, cough and shortness of breath. . After you have been seen by a medical provider, you will be either: o Tested for (COVID-19) and discharged home on quarantine except to seek medical care if symptoms worsen, and asked to  - Stay home and avoid contact with others until you get your results (4-5 days)  - Avoid travel on public transportation if possible (such as bus, train, or airplane) or o Sent to the Emergency Department by EMS for evaluation, COVID-19 testing, and possible admission depending on your condition and test results.  What to do if you are LOW RISK for COVID-19?  Reduce your risk of any infection by using the same precautions used for avoiding the common cold or flu:  [name] Wash your hands often with soap and warm water for at least 20 seconds.  If soap and water are not readily available, use an alcohol-based hand sanitizer with at least 60% alcohol.  . If coughing or  sneezing, cover your mouth and nose by coughing or sneezing into the elbow areas of your shirt or coat, into a tissue or into your sleeve (not your hands). . Avoid shaking hands with others and consider head nods or verbal greetings only. . Avoid touching your eyes, nose, or mouth with unwashed hands.  . Avoid close contact with people who are sick. . Avoid places or events with large numbers of people in one location, like concerts or  sporting events. . Carefully consider travel plans you have or are making. . If you are planning any travel outside or inside the [place], visit the CDC's Travelers' Health webpage for the latest health notices. . If you have some symptoms but not all symptoms, continue to monitor at home and seek medical attention if your symptoms worsen. . If you are having a medical emergency, call 911.   [place] / e-Visit: [email or web address]         [facility]: [facility]: [phone]                   [facility]: [phone]

## 2019-03-12 NOTE — Progress Notes (Signed)
Per Cassie/MD add pegfilgrastim to C2 and subsequent cycles d/t low ANC during cycle 1.  PA approved for Neulasta formulation per Aleen Sells.  Demetrius Charity, PharmD, [facility] Phone: [phone] 03/12/2019

## 2019-03-12 NOTE — Addendum Note (Signed)
Addended by: Delane Ginger on: 03/12/2019 08:15 AM   Modules accepted: Orders

## 2019-03-14 ENCOUNTER — Telehealth: Payer: Self-pay | Admitting: Radiation Oncology

## 2019-03-14 ENCOUNTER — Encounter: Payer: Self-pay | Admitting: Radiation Oncology

## 2019-03-14 NOTE — Addendum Note (Signed)
Encounter addended by: Heywood Footman, RN on: 03/14/2019 4:18 PM  Actions taken: Clinical Note Signed

## 2019-03-14 NOTE — Telephone Encounter (Signed)
Received voicemail message from patient requesting return call. Phoned patient back to inquire. Reassured patient it is his left lung that we plan to treatment starting Monday. Confirmed his understanding that he has small cell lung cancer. Patient expressed feeling better about coming in for treatment on Monday and verbalized appreciation for the return call.

## 2019-03-17 ENCOUNTER — Ambulatory Visit
Admission: RE | Admit: 2019-03-17 | Discharge: 2019-03-17 | Disposition: A | Discharge status: Home / Self Care | Payer: Medicare Other | Source: Ambulatory Visit | Attending: Radiation Oncology | Admitting: Radiation Oncology

## 2019-03-17 ENCOUNTER — Other Ambulatory Visit: Payer: Self-pay | Admitting: Physician Assistant

## 2019-03-17 ENCOUNTER — Telehealth: Payer: Self-pay | Admitting: Medical Oncology

## 2019-03-17 ENCOUNTER — Other Ambulatory Visit: Payer: Self-pay

## 2019-03-17 ENCOUNTER — Inpatient Hospital Stay: Payer: Medicare Other | Attending: Internal Medicine

## 2019-03-17 DIAGNOSIS — I700 Atherosclerosis of aorta: Secondary | ICD-10-CM | POA: Insufficient documentation

## 2019-03-17 DIAGNOSIS — Z880 Allergy status to penicillin: Secondary | ICD-10-CM | POA: Insufficient documentation

## 2019-03-17 DIAGNOSIS — Z5111 Encounter for antineoplastic chemotherapy: Secondary | ICD-10-CM | POA: Insufficient documentation

## 2019-03-17 DIAGNOSIS — C3481 Malignant neoplasm of overlapping sites of right bronchus and lung: Secondary | ICD-10-CM | POA: Insufficient documentation

## 2019-03-17 DIAGNOSIS — J439 Emphysema, unspecified: Secondary | ICD-10-CM | POA: Diagnosis not present

## 2019-03-17 DIAGNOSIS — C3492 Malignant neoplasm of unspecified part of left bronchus or lung: Secondary | ICD-10-CM

## 2019-03-17 DIAGNOSIS — Z86010 Personal history of colonic polyps: Secondary | ICD-10-CM | POA: Insufficient documentation

## 2019-03-17 DIAGNOSIS — D6481 Anemia due to antineoplastic chemotherapy: Secondary | ICD-10-CM | POA: Insufficient documentation

## 2019-03-17 DIAGNOSIS — C3412 Malignant neoplasm of upper lobe, left bronchus or lung: Secondary | ICD-10-CM | POA: Diagnosis not present

## 2019-03-17 DIAGNOSIS — Z5189 Encounter for other specified aftercare: Secondary | ICD-10-CM | POA: Insufficient documentation

## 2019-03-17 DIAGNOSIS — Z510 Encounter for antineoplastic radiation therapy: Secondary | ICD-10-CM | POA: Insufficient documentation

## 2019-03-17 LAB — CMP (CANCER CENTER ONLY)
ALT: 10 U/L (ref 0–44)
AST: 11 U/L — ABNORMAL LOW (ref 15–41)
Albumin: 3.1 g/dL — ABNORMAL LOW (ref 3.5–5.0)
Alkaline Phosphatase: 116 U/L (ref 38–126)
Anion gap: 9 (ref 5–15)
BUN: 57 mg/dL — ABNORMAL HIGH (ref 8–23)
CO2: 22 mmol/L (ref 22–32)
Calcium: 8.7 mg/dL — ABNORMAL LOW (ref 8.9–10.3)
Chloride: 104 mmol/L (ref 98–111)
Creatinine: 2.67 mg/dL — ABNORMAL HIGH (ref 0.61–1.24)
GFR, Est AFR Am: 26 mL/min — ABNORMAL LOW (ref >60)
GFR, Estimated: 23 mL/min — ABNORMAL LOW (ref >60)
Glucose, Bld: 162 mg/dL — ABNORMAL HIGH (ref 70–99)
Potassium: 4.5 mmol/L (ref 3.5–5.1)
Sodium: 135 mmol/L (ref 135–145)
Total Bilirubin: 0.5 mg/dL (ref 0.3–1.2)
Total Protein: 6.6 g/dL (ref 6.5–8.1)

## 2019-03-17 LAB — CBC WITH DIFFERENTIAL (CANCER CENTER ONLY)
Abs Immature Granulocytes: 0.06 10*3/uL (ref 0.00–0.07)
Basophils Absolute: 0 10*3/uL (ref 0.0–0.1)
Basophils Relative: 1 %
Eosinophils Absolute: 0.1 10*3/uL (ref 0.0–0.5)
Eosinophils Relative: 1 %
HCT: 27.1 % — ABNORMAL LOW (ref 39.0–52.0)
Hemoglobin: 9.1 g/dL — ABNORMAL LOW (ref 13.0–17.0)
Immature Granulocytes: 1 %
Lymphocytes Relative: 28 %
Lymphs Abs: 2.3 10*3/uL (ref 0.7–4.0)
MCH: 31.3 pg (ref 26.0–34.0)
MCHC: 33.6 g/dL (ref 30.0–36.0)
MCV: 93.1 fL (ref 80.0–100.0)
Monocytes Absolute: 0.6 10*3/uL (ref 0.1–1.0)
Monocytes Relative: 7 %
Neutro Abs: 5.3 10*3/uL (ref 1.7–7.7)
Neutrophils Relative %: 62 %
Platelet Count: 254 10*3/uL (ref 150–400)
RBC: 2.91 MIL/uL — ABNORMAL LOW (ref 4.22–5.81)
RDW: 14.6 % (ref 11.5–15.5)
WBC Count: 8.4 10*3/uL (ref 4.0–10.5)
nRBC: 0 % (ref 0.0–0.2)

## 2019-03-17 NOTE — Telephone Encounter (Signed)
"  Bad Indigestion and dark urine after getting onpro".  He took antiemetic and it helped. He denies UTI symptoms. I instructed him to continue nausea med and increase po fluids to 10-12 glass/day,

## 2019-03-18 ENCOUNTER — Ambulatory Visit
Admission: RE | Admit: 2019-03-18 | Discharge: 2019-03-18 | Disposition: A | Discharge status: Home / Self Care | Payer: Medicare Other | Source: Ambulatory Visit | Attending: Radiation Oncology | Admitting: Radiation Oncology

## 2019-03-18 ENCOUNTER — Other Ambulatory Visit: Payer: Self-pay

## 2019-03-18 DIAGNOSIS — C3412 Malignant neoplasm of upper lobe, left bronchus or lung: Secondary | ICD-10-CM | POA: Diagnosis not present

## 2019-03-18 DIAGNOSIS — Z510 Encounter for antineoplastic radiation therapy: Secondary | ICD-10-CM | POA: Diagnosis not present

## 2019-03-19 ENCOUNTER — Ambulatory Visit
Admission: RE | Admit: 2019-03-19 | Discharge: 2019-03-19 | Disposition: A | Discharge status: Home / Self Care | Payer: Medicare Other | Source: Ambulatory Visit | Attending: Radiation Oncology | Admitting: Radiation Oncology

## 2019-03-19 ENCOUNTER — Other Ambulatory Visit: Payer: Self-pay

## 2019-03-19 DIAGNOSIS — C3412 Malignant neoplasm of upper lobe, left bronchus or lung: Secondary | ICD-10-CM | POA: Diagnosis not present

## 2019-03-19 DIAGNOSIS — Z510 Encounter for antineoplastic radiation therapy: Secondary | ICD-10-CM | POA: Diagnosis not present

## 2019-03-20 ENCOUNTER — Ambulatory Visit: Payer: Medicare Other

## 2019-03-21 ENCOUNTER — Other Ambulatory Visit: Payer: Self-pay

## 2019-03-21 ENCOUNTER — Ambulatory Visit
Admission: RE | Admit: 2019-03-21 | Discharge: 2019-03-21 | Disposition: A | Discharge status: Home / Self Care | Payer: Medicare Other | Source: Ambulatory Visit | Attending: Radiation Oncology | Admitting: Radiation Oncology

## 2019-03-21 DIAGNOSIS — C3492 Malignant neoplasm of unspecified part of left bronchus or lung: Secondary | ICD-10-CM

## 2019-03-21 DIAGNOSIS — Z510 Encounter for antineoplastic radiation therapy: Secondary | ICD-10-CM | POA: Diagnosis not present

## 2019-03-21 DIAGNOSIS — C3412 Malignant neoplasm of upper lobe, left bronchus or lung: Secondary | ICD-10-CM | POA: Diagnosis not present

## 2019-03-21 MED ORDER — SONAFINE EX EMUL
1.0000 "application " | Freq: Once | CUTANEOUS | Status: AC
  Administered 2019-03-21: 1 via TOPICAL

## 2019-03-21 NOTE — Progress Notes (Signed)
Pt here for patient teaching.  Pt given Radiation and You booklet and Sonafine.  Reviewed areas of pertinence such as fatigue, hair loss, skin changes, throat changes, cough and shortness of breath . Pt able to give teach back of to pat skin and use unscented/gentle soap,apply Sonafine bid and avoid applying anything to skin within 4 hours of treatment. Pt  Verbalizes understanding of information given and will contact nursing with any questions or concerns.     Gloriajean Dell. Leonie Green, BSN

## 2019-03-24 ENCOUNTER — Ambulatory Visit
Admission: RE | Admit: 2019-03-24 | Discharge: 2019-03-24 | Disposition: A | Discharge status: Home / Self Care | Payer: Medicare Other | Source: Ambulatory Visit | Attending: Radiation Oncology | Admitting: Radiation Oncology

## 2019-03-24 ENCOUNTER — Other Ambulatory Visit: Payer: Self-pay

## 2019-03-24 ENCOUNTER — Inpatient Hospital Stay: Payer: Medicare Other

## 2019-03-24 ENCOUNTER — Ambulatory Visit (HOSPITAL_COMMUNITY)
Admission: RE | Admit: 2019-03-24 | Discharge: 2019-03-24 | Disposition: A | Discharge status: Home / Self Care | Payer: Medicare Other | Source: Ambulatory Visit | Attending: Physician Assistant | Admitting: Physician Assistant

## 2019-03-24 ENCOUNTER — Ambulatory Visit (HOSPITAL_COMMUNITY): Payer: Medicare Other

## 2019-03-24 DIAGNOSIS — C3492 Malignant neoplasm of unspecified part of left bronchus or lung: Secondary | ICD-10-CM | POA: Insufficient documentation

## 2019-03-24 DIAGNOSIS — D6481 Anemia due to antineoplastic chemotherapy: Secondary | ICD-10-CM | POA: Diagnosis not present

## 2019-03-24 DIAGNOSIS — I700 Atherosclerosis of aorta: Secondary | ICD-10-CM | POA: Diagnosis not present

## 2019-03-24 DIAGNOSIS — Z5111 Encounter for antineoplastic chemotherapy: Secondary | ICD-10-CM | POA: Diagnosis not present

## 2019-03-24 DIAGNOSIS — Z5189 Encounter for other specified aftercare: Secondary | ICD-10-CM | POA: Diagnosis not present

## 2019-03-24 DIAGNOSIS — C3481 Malignant neoplasm of overlapping sites of right bronchus and lung: Secondary | ICD-10-CM | POA: Diagnosis not present

## 2019-03-24 DIAGNOSIS — J439 Emphysema, unspecified: Secondary | ICD-10-CM | POA: Diagnosis not present

## 2019-03-24 DIAGNOSIS — C3490 Malignant neoplasm of unspecified part of unspecified bronchus or lung: Secondary | ICD-10-CM | POA: Diagnosis not present

## 2019-03-24 DIAGNOSIS — Z880 Allergy status to penicillin: Secondary | ICD-10-CM | POA: Diagnosis not present

## 2019-03-24 DIAGNOSIS — Z510 Encounter for antineoplastic radiation therapy: Secondary | ICD-10-CM | POA: Diagnosis not present

## 2019-03-24 DIAGNOSIS — Z86010 Personal history of colonic polyps: Secondary | ICD-10-CM | POA: Diagnosis not present

## 2019-03-24 DIAGNOSIS — C3412 Malignant neoplasm of upper lobe, left bronchus or lung: Secondary | ICD-10-CM | POA: Diagnosis not present

## 2019-03-24 LAB — CMP (CANCER CENTER ONLY)
ALT: 14 U/L (ref 0–44)
AST: 17 U/L (ref 15–41)
Albumin: 2.9 g/dL — ABNORMAL LOW (ref 3.5–5.0)
Alkaline Phosphatase: 138 U/L — ABNORMAL HIGH (ref 38–126)
Anion gap: 12 (ref 5–15)
BUN: 25 mg/dL — ABNORMAL HIGH (ref 8–23)
CO2: 21 mmol/L — ABNORMAL LOW (ref 22–32)
Calcium: 8.1 mg/dL — ABNORMAL LOW (ref 8.9–10.3)
Chloride: 106 mmol/L (ref 98–111)
Creatinine: 2.79 mg/dL — ABNORMAL HIGH (ref 0.61–1.24)
GFR, Est AFR Am: 25 mL/min — ABNORMAL LOW (ref >60)
GFR, Estimated: 22 mL/min — ABNORMAL LOW (ref >60)
Glucose, Bld: 165 mg/dL — ABNORMAL HIGH (ref 70–99)
Potassium: 4.3 mmol/L (ref 3.5–5.1)
Sodium: 139 mmol/L (ref 135–145)
Total Bilirubin: <0.2 mg/dL — ABNORMAL LOW (ref 0.3–1.2)
Total Protein: 6.5 g/dL (ref 6.5–8.1)

## 2019-03-24 LAB — CBC WITH DIFFERENTIAL (CANCER CENTER ONLY)
Abs Immature Granulocytes: 9.03 10*3/uL — ABNORMAL HIGH (ref 0.00–0.07)
Basophils Absolute: 0.1 10*3/uL (ref 0.0–0.1)
Basophils Relative: 0 %
Eosinophils Absolute: 0.3 10*3/uL (ref 0.0–0.5)
Eosinophils Relative: 1 %
HCT: 24.6 % — ABNORMAL LOW (ref 39.0–52.0)
Hemoglobin: 8.3 g/dL — ABNORMAL LOW (ref 13.0–17.0)
Immature Granulocytes: 25 %
Lymphocytes Relative: 14 %
Lymphs Abs: 5 10*3/uL — ABNORMAL HIGH (ref 0.7–4.0)
MCH: 31.1 pg (ref 26.0–34.0)
MCHC: 33.7 g/dL (ref 30.0–36.0)
MCV: 92.1 fL (ref 80.0–100.0)
Monocytes Absolute: 2.9 10*3/uL — ABNORMAL HIGH (ref 0.1–1.0)
Monocytes Relative: 8 %
Neutro Abs: 18.6 10*3/uL — ABNORMAL HIGH (ref 1.7–7.7)
Neutrophils Relative %: 52 %
Platelet Count: 90 10*3/uL — ABNORMAL LOW (ref 150–400)
RBC: 2.67 MIL/uL — ABNORMAL LOW (ref 4.22–5.81)
RDW: 15.4 % (ref 11.5–15.5)
WBC Count: 36 10*3/uL — ABNORMAL HIGH (ref 4.0–10.5)
nRBC: 0.4 % — ABNORMAL HIGH (ref 0.0–0.2)

## 2019-03-25 ENCOUNTER — Other Ambulatory Visit: Payer: Self-pay

## 2019-03-25 ENCOUNTER — Ambulatory Visit
Admission: RE | Admit: 2019-03-25 | Discharge: 2019-03-25 | Disposition: A | Discharge status: Home / Self Care | Payer: Medicare Other | Source: Ambulatory Visit | Attending: Radiation Oncology | Admitting: Radiation Oncology

## 2019-03-25 DIAGNOSIS — Z510 Encounter for antineoplastic radiation therapy: Secondary | ICD-10-CM | POA: Diagnosis not present

## 2019-03-25 DIAGNOSIS — C3412 Malignant neoplasm of upper lobe, left bronchus or lung: Secondary | ICD-10-CM | POA: Diagnosis not present

## 2019-03-26 ENCOUNTER — Other Ambulatory Visit: Payer: Self-pay

## 2019-03-26 ENCOUNTER — Ambulatory Visit
Admission: RE | Admit: 2019-03-26 | Discharge: 2019-03-26 | Disposition: A | Discharge status: Home / Self Care | Payer: Medicare Other | Source: Ambulatory Visit | Attending: Radiation Oncology | Admitting: Radiation Oncology

## 2019-03-26 DIAGNOSIS — Z510 Encounter for antineoplastic radiation therapy: Secondary | ICD-10-CM | POA: Diagnosis not present

## 2019-03-26 DIAGNOSIS — C3412 Malignant neoplasm of upper lobe, left bronchus or lung: Secondary | ICD-10-CM | POA: Diagnosis not present

## 2019-03-27 ENCOUNTER — Ambulatory Visit
Admission: RE | Admit: 2019-03-27 | Discharge: 2019-03-27 | Disposition: A | Discharge status: Home / Self Care | Payer: Medicare Other | Source: Ambulatory Visit | Attending: Radiation Oncology | Admitting: Radiation Oncology

## 2019-03-27 ENCOUNTER — Other Ambulatory Visit: Payer: Self-pay

## 2019-03-27 DIAGNOSIS — Z510 Encounter for antineoplastic radiation therapy: Secondary | ICD-10-CM | POA: Diagnosis not present

## 2019-03-27 DIAGNOSIS — C3412 Malignant neoplasm of upper lobe, left bronchus or lung: Secondary | ICD-10-CM | POA: Diagnosis not present

## 2019-03-28 ENCOUNTER — Other Ambulatory Visit: Payer: Self-pay

## 2019-03-28 ENCOUNTER — Ambulatory Visit
Admission: RE | Admit: 2019-03-28 | Discharge: 2019-03-28 | Disposition: A | Discharge status: Home / Self Care | Payer: Medicare Other | Source: Ambulatory Visit | Attending: Radiation Oncology | Admitting: Radiation Oncology

## 2019-03-28 DIAGNOSIS — Z510 Encounter for antineoplastic radiation therapy: Secondary | ICD-10-CM | POA: Diagnosis not present

## 2019-03-28 DIAGNOSIS — C3412 Malignant neoplasm of upper lobe, left bronchus or lung: Secondary | ICD-10-CM | POA: Diagnosis not present

## 2019-03-31 ENCOUNTER — Inpatient Hospital Stay (HOSPITAL_BASED_OUTPATIENT_CLINIC_OR_DEPARTMENT_OTHER): Payer: Medicare Other | Admitting: Internal Medicine

## 2019-03-31 ENCOUNTER — Inpatient Hospital Stay: Payer: Medicare Other

## 2019-03-31 ENCOUNTER — Ambulatory Visit
Admission: RE | Admit: 2019-03-31 | Discharge: 2019-03-31 | Disposition: A | Discharge status: Home / Self Care | Payer: Medicare Other | Source: Ambulatory Visit | Attending: Radiation Oncology | Admitting: Radiation Oncology

## 2019-03-31 ENCOUNTER — Encounter: Payer: Self-pay | Admitting: Internal Medicine

## 2019-03-31 ENCOUNTER — Telehealth: Payer: Self-pay | Admitting: *Deleted

## 2019-03-31 ENCOUNTER — Other Ambulatory Visit: Payer: Self-pay

## 2019-03-31 VITALS — BP 129/65 | HR 97 | Temp 98.2°F | Resp 18 | Ht 69.0 in | Wt 202.5 lb

## 2019-03-31 DIAGNOSIS — C3492 Malignant neoplasm of unspecified part of left bronchus or lung: Secondary | ICD-10-CM

## 2019-03-31 DIAGNOSIS — D6481 Anemia due to antineoplastic chemotherapy: Secondary | ICD-10-CM

## 2019-03-31 DIAGNOSIS — I700 Atherosclerosis of aorta: Secondary | ICD-10-CM

## 2019-03-31 DIAGNOSIS — J439 Emphysema, unspecified: Secondary | ICD-10-CM | POA: Diagnosis not present

## 2019-03-31 DIAGNOSIS — Z86010 Personal history of colonic polyps: Secondary | ICD-10-CM

## 2019-03-31 DIAGNOSIS — Z510 Encounter for antineoplastic radiation therapy: Secondary | ICD-10-CM | POA: Diagnosis not present

## 2019-03-31 DIAGNOSIS — Z880 Allergy status to penicillin: Secondary | ICD-10-CM | POA: Diagnosis not present

## 2019-03-31 DIAGNOSIS — C3481 Malignant neoplasm of overlapping sites of right bronchus and lung: Secondary | ICD-10-CM | POA: Diagnosis not present

## 2019-03-31 DIAGNOSIS — F439 Reaction to severe stress, unspecified: Secondary | ICD-10-CM | POA: Diagnosis not present

## 2019-03-31 DIAGNOSIS — Z5111 Encounter for antineoplastic chemotherapy: Secondary | ICD-10-CM | POA: Diagnosis not present

## 2019-03-31 DIAGNOSIS — C3412 Malignant neoplasm of upper lobe, left bronchus or lung: Secondary | ICD-10-CM | POA: Diagnosis not present

## 2019-03-31 DIAGNOSIS — Z5189 Encounter for other specified aftercare: Secondary | ICD-10-CM

## 2019-03-31 DIAGNOSIS — E02 Subclinical iodine-deficiency hypothyroidism: Secondary | ICD-10-CM

## 2019-03-31 LAB — CBC WITH DIFFERENTIAL (CANCER CENTER ONLY)
Abs Immature Granulocytes: 0.86 10*3/uL — ABNORMAL HIGH (ref 0.00–0.07)
Basophils Absolute: 0.1 10*3/uL (ref 0.0–0.1)
Basophils Relative: 0 %
Eosinophils Absolute: 0 10*3/uL (ref 0.0–0.5)
Eosinophils Relative: 0 %
HCT: 24.3 % — ABNORMAL LOW (ref 39.0–52.0)
Hemoglobin: 8.2 g/dL — ABNORMAL LOW (ref 13.0–17.0)
Immature Granulocytes: 4 %
Lymphocytes Relative: 15 %
Lymphs Abs: 3 10*3/uL (ref 0.7–4.0)
MCH: 31.4 pg (ref 26.0–34.0)
MCHC: 33.7 g/dL (ref 30.0–36.0)
MCV: 93.1 fL (ref 80.0–100.0)
Monocytes Absolute: 3.6 10*3/uL — ABNORMAL HIGH (ref 0.1–1.0)
Monocytes Relative: 18 %
Neutro Abs: 13.1 10*3/uL — ABNORMAL HIGH (ref 1.7–7.7)
Neutrophils Relative %: 63 %
Platelet Count: 526 10*3/uL — ABNORMAL HIGH (ref 150–400)
RBC: 2.61 MIL/uL — ABNORMAL LOW (ref 4.22–5.81)
RDW: 16.6 % — ABNORMAL HIGH (ref 11.5–15.5)
WBC Count: 20.7 10*3/uL — ABNORMAL HIGH (ref 4.0–10.5)
nRBC: 0.1 % (ref 0.0–0.2)

## 2019-03-31 LAB — CMP (CANCER CENTER ONLY)
ALT: 16 U/L (ref 0–44)
AST: 14 U/L — ABNORMAL LOW (ref 15–41)
Albumin: 2.6 g/dL — ABNORMAL LOW (ref 3.5–5.0)
Alkaline Phosphatase: 89 U/L (ref 38–126)
Anion gap: 11 (ref 5–15)
BUN: 36 mg/dL — ABNORMAL HIGH (ref 8–23)
CO2: 21 mmol/L — ABNORMAL LOW (ref 22–32)
Calcium: 8.9 mg/dL (ref 8.9–10.3)
Chloride: 105 mmol/L (ref 98–111)
Creatinine: 3.37 mg/dL (ref 0.61–1.24)
GFR, Est AFR Am: 20 mL/min — ABNORMAL LOW (ref >60)
GFR, Estimated: 17 mL/min — ABNORMAL LOW (ref >60)
Glucose, Bld: 115 mg/dL — ABNORMAL HIGH (ref 70–99)
Potassium: 4.5 mmol/L (ref 3.5–5.1)
Sodium: 137 mmol/L (ref 135–145)
Total Bilirubin: 0.3 mg/dL (ref 0.3–1.2)
Total Protein: 7.3 g/dL (ref 6.5–8.1)

## 2019-03-31 MED ORDER — PALONOSETRON HCL INJECTION 0.25 MG/5ML
0.2500 mg | Freq: Once | INTRAVENOUS | Status: AC
  Administered 2019-03-31: 12:00:00 0.25 mg via INTRAVENOUS

## 2019-03-31 MED ORDER — SODIUM CHLORIDE 0.9 % IV SOLN
80.0000 mg/m2 | Freq: Once | INTRAVENOUS | Status: AC
  Administered 2019-03-31: 14:00:00 170 mg via INTRAVENOUS
  Filled 2019-03-31: qty 8.5

## 2019-03-31 MED ORDER — DEXAMETHASONE SODIUM PHOSPHATE 10 MG/ML IJ SOLN
10.0000 mg | Freq: Once | INTRAMUSCULAR | Status: AC
  Administered 2019-03-31: 12:00:00 10 mg via INTRAVENOUS

## 2019-03-31 MED ORDER — PALONOSETRON HCL INJECTION 0.25 MG/5ML
INTRAVENOUS | Status: AC
  Filled 2019-03-31: qty 5

## 2019-03-31 MED ORDER — DEXAMETHASONE SODIUM PHOSPHATE 10 MG/ML IJ SOLN
INTRAMUSCULAR | Status: AC
  Filled 2019-03-31: qty 1

## 2019-03-31 MED ORDER — SODIUM CHLORIDE 0.9 % IV SOLN
251.5000 mg | Freq: Once | INTRAVENOUS | Status: AC
  Administered 2019-03-31: 13:00:00 250 mg via INTRAVENOUS
  Filled 2019-03-31: qty 25

## 2019-03-31 MED ORDER — SODIUM CHLORIDE 0.9 % IV SOLN
Freq: Once | INTRAVENOUS | Status: AC
  Administered 2019-03-31: 12:00:00 via INTRAVENOUS
  Filled 2019-03-31: qty 250

## 2019-03-31 NOTE — Progress Notes (Signed)
Per Julien Nordmann it is OK to treat pt today with Carboplatin and VP-16 and creatinine 3.37

## 2019-03-31 NOTE — Patient Instructions (Signed)
[facility] Discharge Instructions for Patients Receiving Chemotherapy  Today you received the following chemotherapy agents :  Carboplatin,  Etoposide.  To help prevent nausea and vomiting after your treatment, we encourage you to take your nausea medication as prescribed.   If you develop nausea and vomiting that is not controlled by your nausea medication, call the clinic.   BELOW ARE SYMPTOMS THAT SHOULD BE REPORTED IMMEDIATELY:  *FEVER GREATER THAN 100.5 F  *CHILLS WITH OR WITHOUT FEVER  NAUSEA AND VOMITING THAT IS NOT CONTROLLED WITH YOUR NAUSEA MEDICATION  *UNUSUAL SHORTNESS OF BREATH  *UNUSUAL BRUISING OR BLEEDING  TENDERNESS IN MOUTH AND THROAT WITH OR WITHOUT PRESENCE OF ULCERS  *URINARY PROBLEMS  *BOWEL PROBLEMS  UNUSUAL RASH Items with * indicate a potential emergency and should be followed up as soon as possible.  Feel free to call the clinic should you have any questions or concerns. The clinic phone number is [phone].  Please show the [facility] at check-in to the Emergency Department and triage nurse.

## 2019-03-31 NOTE — Telephone Encounter (Signed)
Notified of elevated creatinine from lab. Message relayed to Abelina Bachelor, RN with Dr. Julien Nordmann.

## 2019-03-31 NOTE — Progress Notes (Signed)
[name] [phone]   [phone]  OFFICE PROGRESS NOTE  [name], MD [address] [place] [address]  DIAGNOSIS: Limited stage (T2b, N1, M0) small cell lung cancer presented with right upper lobe with invasion of superior segment of right lower lobe as well as right hilar lymphadenopathy diagnosed in March 2020.  PRIOR THERAPY: None.  CURRENT THERAPY: Systemic chemotherapy with carboplatin for AUC of 5 on day 1 and etoposide 100 mg/M2 on days 1, 2 and 3 every 3 weeks.  Status post 2 cycles.  INTERVAL HISTORY: Jeremy Johnson 73 y.o. male returns to the clinic today for follow-up visit.  His wife was available by phone.  The patient is feeling fine today with no concerning complaints except for fatigue and shortness of breath with exertion.  He denied having any chest pain, cough or hemoptysis.  He denied having any fever or chills.  He has no nausea, vomiting, diarrhea or constipation.  He denied having any headache or visual changes.  He had repeat CT scan of the chest performed recently and he is here for evaluation and discussion of his scan results.  MEDICAL HISTORY: Past Medical History:  Diagnosis Date  . CAD (coronary artery disease)    STENT... MID CIRCUMFLEX...1997  . Chronic kidney disease    STAGE 3  . Degenerative joint disease (DJD) of lumbar spine   . GERD (gastroesophageal reflux disease)   . Gout   . Hyperlipidemia   . Hypertension   . Hypothyroidism   . Incisional hernia   . Leukocytosis    CHRONIC MILD  . Myocardial infarction ([place])    1997    ALLERGIES:  is allergic to penicillins.  MEDICATIONS:  Current Outpatient Medications  Medication Sig Dispense Refill  . allopurinol (ZYLOPRIM) 300 MG tablet Take 300 mg by mouth daily.    [name] amLODipine (NORVASC) 5 MG tablet Take 5 mg by mouth daily.     [name] aspirin EC 81 MG tablet Take 81 mg by mouth daily.    . calcitRIOL (ROCALTROL) 0.25 MCG capsule Take  0.25 mcg by mouth daily.     [name] levothyroxine (SYNTHROID, LEVOTHROID) 175 MCG tablet Take 175 mcg by mouth daily before breakfast.    . losartan-hydrochlorothiazide (HYZAAR) 100-25 MG tablet Take 1 tablet by mouth daily.    . metoprolol tartrate (LOPRESSOR) 50 MG tablet Take 25 mg by mouth 2 (two) times daily.     . mometasone (ELOCON) 0.1 % ointment Apply 1 application topically 2 (two) times daily as needed (for eczema).   2  . omeprazole (PRILOSEC) 20 MG capsule Take 20 mg by mouth daily.    . prochlorperazine (COMPAZINE) 10 MG tablet Take 1 tablet (10 mg total) by mouth every 6 (six) hours as needed for nausea or vomiting. 30 tablet 0  . simvastatin (ZOCOR) 20 MG tablet Take 20 mg by mouth daily.      No current facility-administered medications for this visit.     SURGICAL HISTORY:  Past Surgical History:  Procedure Laterality Date  . ABDOMINAL AORTIC ANEURYSM REPAIR  2006  . BIOPSY  09/30/2018   Procedure: BIOPSY;  Surgeon: Danie Binder, MD;  [facility];  Service: Endoscopy;;  ascending colon  . COLONOSCOPY  2008  . COLONOSCOPY N/A 09/30/2018   Procedure: COLONOSCOPY;  Surgeon: Danie Binder, MD;  [facility];  Service: Endoscopy;  Laterality: N/A;  9:00  . CORONARY ANGIOPLASTY  WITH STENT PLACEMENT  2008   MID CIRCUMFLEX  . POLYPECTOMY  09/30/2018   Procedure: POLYPECTOMY;  Surgeon: Danie Binder, MD;  [facility];  Service: Endoscopy;;  colon  . VIDEO BRONCHOSCOPY WITH ENDOBRONCHIAL NAVIGATION N/A 01/27/2019   Procedure: VIDEO BRONCHOSCOPY WITH ENDOBRONCHIAL NAVIGATION;  Surgeon: Grace Isaac, MD;  Location: [place];  Service: Thoracic;  Laterality: N/A;  . VIDEO BRONCHOSCOPY WITH ENDOBRONCHIAL ULTRASOUND N/A 01/27/2019   Procedure: VIDEO BRONCHOSCOPY WITH ENDOBRONCHIAL ULTRASOUND;  Surgeon: Grace Isaac, MD;  Location: [place];  Service: Thoracic;  Laterality: N/A;    REVIEW OF SYSTEMS:  Constitutional: positive for fatigue  Eyes: negative Ears, nose, mouth, throat, and face: negative Respiratory: positive for dyspnea on exertion Cardiovascular: negative Gastrointestinal: negative Genitourinary:negative Integument/breast: negative Hematologic/lymphatic: negative Musculoskeletal:negative Neurological: negative Behavioral/Psych: negative Endocrine: negative Allergic/Immunologic: negative   PHYSICAL EXAMINATION: General appearance: alert, cooperative, fatigued and no distress Head: Normocephalic, without obvious abnormality, atraumatic Neck: no adenopathy, no JVD, supple, symmetrical, trachea midline and thyroid not enlarged, symmetric, no tenderness/mass/nodules Lymph nodes: Cervical, supraclavicular, and axillary nodes normal. Resp: clear to auscultation bilaterally Back: symmetric, no curvature. ROM normal. No CVA tenderness. Cardio: regular rate and rhythm, S1, S2 normal, no murmur, click, rub or gallop GI: soft, non-tender; bowel sounds normal; no masses,  no organomegaly Extremities: extremities normal, atraumatic, no cyanosis or edema Neurologic: Alert and oriented X 3, normal strength and tone. Normal symmetric reflexes. Normal coordination and gait  ECOG PERFORMANCE STATUS: 1 - Symptomatic but completely ambulatory  Blood pressure 129/65, pulse 97, temperature 98.2 F (36.8 C), temperature source Oral, resp. rate 18, height 5\' 9"  (1.753 m), weight 202 lb 8 oz (91.9 kg), SpO2 98 %.  LABORATORY DATA: Lab Results  Component Value Date   WBC 20.7 (H) 03/31/2019   HGB 8.2 (L) 03/31/2019   HCT 24.3 (L) 03/31/2019   MCV 93.1 03/31/2019   PLT 526 (H) 03/31/2019      Chemistry      Component Value Date/Time   NA 139 03/24/2019 1413   K 4.3 03/24/2019 1413   CL 106 03/24/2019 1413   CO2 21 (L) 03/24/2019 1413   BUN 25 (H) 03/24/2019 1413   CREATININE 2.79 (H) 03/24/2019 1413      Component Value Date/Time   CALCIUM 8.1 (L) 03/24/2019 1413   ALKPHOS 138 (H) 03/24/2019 1413   AST 17  03/24/2019 1413   ALT 14 03/24/2019 1413   BILITOT <0.2 (L) 03/24/2019 1413       RADIOGRAPHIC STUDIES: Ct Chest Wo Contrast  Result Date: 03/25/2019 CLINICAL DATA:  Limited stage small cell lung cancer EXAM: CT CHEST WITHOUT CONTRAST TECHNIQUE: Multidetector CT imaging of the chest was performed following the standard protocol without IV contrast. COMPARISON:  PET-CT dated 01/22/2019 FINDINGS: Cardiovascular: Heart is top-normal in size. No pericardial effusion. Stable focal aneurysmal dilatation anteriorly along the anterior aspect of the transverse aortic arch (series 2/image 50), measuring up to 4.7 cm, grossly unchanged. Atherosclerotic calcifications of the abdominal aorta and branch vessels. Coronary atherosclerosis of the LAD and left circumflex. Mediastinum/Nodes: No suspicious mediastinal lymphadenopathy. Lungs/Pleura: Prior perifissural nodule/mass anterior to the descending thoracic aorta has essentially resolved (series 7/image 58). Minimal residual scarring/radiation changes. Otherwise, no suspicious pulmonary nodules. No focal consolidation. Moderate centrilobular and paraseptal emphysematous changes, upper lobe predominant. No pleural effusion or pneumothorax. Upper Abdomen: Visualized upper abdomen is grossly unremarkable, noting vascular calcifications. Musculoskeletal: Degenerative changes of the visualized thoracolumbar spine. IMPRESSION: Prior perifissural nodule/mass anterior to  the descending thoracic aorta has essentially resolved. Mild residual scarring/radiation changes. No evidence of recurrent/metastatic disease. Aortic Atherosclerosis (ICD10-I70.0) and Emphysema (ICD10-J43.9). Electronically Signed   By: Julian Hy M.D.   On: 03/25/2019 08:49    ASSESSMENT AND PLAN: This is a very pleasant 73 years old white male with limited stage small cell lung cancer and currently undergoing systemic chemotherapy with carboplatin and etoposide status post 2 cycles. The patient  continues to tolerate this treatment well with no concerning adverse effects. He had repeat CT scan of the chest performed recently.  I personally and independently reviewed the scans and discussed the results with the patient and showed him the images today. His scan showed almost complete resolution of the mass anterior to the descending thoracic aorta. I recommended for the patient to continue his current treatment and he will proceed with cycle #3 of his chemotherapy today. For the chemotherapy-induced anemia, we will continue to monitor his hemoglobin and hematocrit closely and consider the patient for transfusion if needed. He will come back for follow-up visit in 3 weeks for evaluation before the next cycle of his treatment. He was advised to call immediately if he has any concerning symptoms in the interval. The patient voices understanding of current disease status and treatment options and is in agreement with the current care plan.  All questions were answered. The patient knows to call the clinic with any problems, questions or concerns. We can certainly see the patient much sooner if necessary.  Disclaimer: This note was dictated with voice recognition software. Similar sounding words can inadvertently be transcribed and may not be corrected upon review.

## 2019-04-01 ENCOUNTER — Other Ambulatory Visit: Payer: Self-pay

## 2019-04-01 ENCOUNTER — Inpatient Hospital Stay: Payer: Medicare Other

## 2019-04-01 ENCOUNTER — Ambulatory Visit
Admission: RE | Admit: 2019-04-01 | Discharge: 2019-04-01 | Disposition: A | Discharge status: Home / Self Care | Payer: Medicare Other | Source: Ambulatory Visit | Attending: Radiation Oncology | Admitting: Radiation Oncology

## 2019-04-01 VITALS — BP 125/71 | HR 81 | Temp 97.7°F | Resp 18

## 2019-04-01 DIAGNOSIS — Z5189 Encounter for other specified aftercare: Secondary | ICD-10-CM | POA: Diagnosis not present

## 2019-04-01 DIAGNOSIS — C3492 Malignant neoplasm of unspecified part of left bronchus or lung: Secondary | ICD-10-CM

## 2019-04-01 DIAGNOSIS — N184 Chronic kidney disease, stage 4 (severe): Secondary | ICD-10-CM | POA: Diagnosis not present

## 2019-04-01 DIAGNOSIS — I700 Atherosclerosis of aorta: Secondary | ICD-10-CM | POA: Diagnosis not present

## 2019-04-01 DIAGNOSIS — Z86010 Personal history of colonic polyps: Secondary | ICD-10-CM | POA: Diagnosis not present

## 2019-04-01 DIAGNOSIS — J439 Emphysema, unspecified: Secondary | ICD-10-CM | POA: Diagnosis not present

## 2019-04-01 DIAGNOSIS — C3481 Malignant neoplasm of overlapping sites of right bronchus and lung: Secondary | ICD-10-CM | POA: Diagnosis not present

## 2019-04-01 DIAGNOSIS — Z510 Encounter for antineoplastic radiation therapy: Secondary | ICD-10-CM | POA: Diagnosis not present

## 2019-04-01 DIAGNOSIS — C3491 Malignant neoplasm of unspecified part of right bronchus or lung: Secondary | ICD-10-CM | POA: Diagnosis not present

## 2019-04-01 DIAGNOSIS — Z880 Allergy status to penicillin: Secondary | ICD-10-CM | POA: Diagnosis not present

## 2019-04-01 DIAGNOSIS — D6481 Anemia due to antineoplastic chemotherapy: Secondary | ICD-10-CM | POA: Diagnosis not present

## 2019-04-01 DIAGNOSIS — Z5111 Encounter for antineoplastic chemotherapy: Secondary | ICD-10-CM | POA: Diagnosis not present

## 2019-04-01 DIAGNOSIS — C3412 Malignant neoplasm of upper lobe, left bronchus or lung: Secondary | ICD-10-CM | POA: Diagnosis not present

## 2019-04-01 MED ORDER — DEXAMETHASONE SODIUM PHOSPHATE 10 MG/ML IJ SOLN
INTRAMUSCULAR | Status: AC
  Filled 2019-04-01: qty 1

## 2019-04-01 MED ORDER — SODIUM CHLORIDE 0.9 % IV SOLN
80.0000 mg/m2 | Freq: Once | INTRAVENOUS | Status: AC
  Administered 2019-04-01: 09:00:00 170 mg via INTRAVENOUS
  Filled 2019-04-01: qty 8.5

## 2019-04-01 MED ORDER — SODIUM CHLORIDE 0.9 % IV SOLN
Freq: Once | INTRAVENOUS | Status: AC
  Administered 2019-04-01: 09:00:00 via INTRAVENOUS
  Filled 2019-04-01: qty 250

## 2019-04-01 MED ORDER — DEXAMETHASONE SODIUM PHOSPHATE 10 MG/ML IJ SOLN
10.0000 mg | Freq: Once | INTRAMUSCULAR | Status: AC
  Administered 2019-04-01: 09:00:00 10 mg via INTRAVENOUS

## 2019-04-02 ENCOUNTER — Other Ambulatory Visit: Payer: Self-pay

## 2019-04-02 ENCOUNTER — Ambulatory Visit
Admission: RE | Admit: 2019-04-02 | Discharge: 2019-04-02 | Disposition: A | Discharge status: Home / Self Care | Payer: Medicare Other | Source: Ambulatory Visit | Attending: Radiation Oncology | Admitting: Radiation Oncology

## 2019-04-02 ENCOUNTER — Inpatient Hospital Stay: Payer: Medicare Other

## 2019-04-02 VITALS — BP 139/77 | HR 80 | Temp 98.1°F | Resp 18

## 2019-04-02 DIAGNOSIS — C3481 Malignant neoplasm of overlapping sites of right bronchus and lung: Secondary | ICD-10-CM | POA: Diagnosis not present

## 2019-04-02 DIAGNOSIS — Z5111 Encounter for antineoplastic chemotherapy: Secondary | ICD-10-CM | POA: Diagnosis not present

## 2019-04-02 DIAGNOSIS — I700 Atherosclerosis of aorta: Secondary | ICD-10-CM | POA: Diagnosis not present

## 2019-04-02 DIAGNOSIS — C3412 Malignant neoplasm of upper lobe, left bronchus or lung: Secondary | ICD-10-CM | POA: Diagnosis not present

## 2019-04-02 DIAGNOSIS — Z86010 Personal history of colonic polyps: Secondary | ICD-10-CM | POA: Diagnosis not present

## 2019-04-02 DIAGNOSIS — Z510 Encounter for antineoplastic radiation therapy: Secondary | ICD-10-CM | POA: Diagnosis not present

## 2019-04-02 DIAGNOSIS — J439 Emphysema, unspecified: Secondary | ICD-10-CM | POA: Diagnosis not present

## 2019-04-02 DIAGNOSIS — Z880 Allergy status to penicillin: Secondary | ICD-10-CM | POA: Diagnosis not present

## 2019-04-02 DIAGNOSIS — C3492 Malignant neoplasm of unspecified part of left bronchus or lung: Secondary | ICD-10-CM

## 2019-04-02 DIAGNOSIS — D6481 Anemia due to antineoplastic chemotherapy: Secondary | ICD-10-CM | POA: Diagnosis not present

## 2019-04-02 DIAGNOSIS — Z5189 Encounter for other specified aftercare: Secondary | ICD-10-CM | POA: Diagnosis not present

## 2019-04-02 MED ORDER — PEGFILGRASTIM 6 MG/0.6ML ~~LOC~~ PSKT
6.0000 mg | PREFILLED_SYRINGE | Freq: Once | SUBCUTANEOUS | Status: AC
  Administered 2019-04-02: 11:00:00 6 mg via SUBCUTANEOUS

## 2019-04-02 MED ORDER — DEXAMETHASONE SODIUM PHOSPHATE 10 MG/ML IJ SOLN
INTRAMUSCULAR | Status: AC
  Filled 2019-04-02: qty 1

## 2019-04-02 MED ORDER — SODIUM CHLORIDE 0.9 % IV SOLN
80.0000 mg/m2 | Freq: Once | INTRAVENOUS | Status: AC
  Administered 2019-04-02: 170 mg via INTRAVENOUS
  Filled 2019-04-02: qty 8.5

## 2019-04-02 MED ORDER — PEGFILGRASTIM 6 MG/0.6ML ~~LOC~~ PSKT
PREFILLED_SYRINGE | SUBCUTANEOUS | Status: AC
  Filled 2019-04-02: qty 0.6

## 2019-04-02 MED ORDER — DEXAMETHASONE SODIUM PHOSPHATE 10 MG/ML IJ SOLN
10.0000 mg | Freq: Once | INTRAMUSCULAR | Status: AC
  Administered 2019-04-02: 09:00:00 10 mg via INTRAVENOUS

## 2019-04-02 MED ORDER — SODIUM CHLORIDE 0.9 % IV SOLN
Freq: Once | INTRAVENOUS | Status: AC
  Administered 2019-04-02: 09:00:00 via INTRAVENOUS
  Filled 2019-04-02: qty 250

## 2019-04-02 NOTE — Patient Instructions (Signed)
**  Remove Neulasta Onpro after 3 pm on Thursday 04/03/19.  [facility] Discharge Instructions for Patients Receiving Chemotherapy  Today you received the following chemotherapy agents :  Etoposide.  To help prevent nausea and vomiting after your treatment, we encourage you to take your nausea medication as prescribed.   If you develop nausea and vomiting that is not controlled by your nausea medication, call the clinic.   BELOW ARE SYMPTOMS THAT SHOULD BE REPORTED IMMEDIATELY:  *FEVER GREATER THAN 100.5 F  *CHILLS WITH OR WITHOUT FEVER  NAUSEA AND VOMITING THAT IS NOT CONTROLLED WITH YOUR NAUSEA MEDICATION  *UNUSUAL SHORTNESS OF BREATH  *UNUSUAL BRUISING OR BLEEDING  TENDERNESS IN MOUTH AND THROAT WITH OR WITHOUT PRESENCE OF ULCERS  *URINARY PROBLEMS  *BOWEL PROBLEMS  UNUSUAL RASH Items with * indicate a potential emergency and should be followed up as soon as possible.  Feel free to call the clinic should you have any questions or concerns. The clinic phone number is [phone].  Please show the [facility] at check-in to the Emergency Department and triage nurse.

## 2019-04-03 ENCOUNTER — Other Ambulatory Visit: Payer: Self-pay

## 2019-04-03 ENCOUNTER — Ambulatory Visit
Admission: RE | Admit: 2019-04-03 | Discharge: 2019-04-03 | Disposition: A | Discharge status: Home / Self Care | Payer: Medicare Other | Source: Ambulatory Visit | Attending: Radiation Oncology | Admitting: Radiation Oncology

## 2019-04-03 DIAGNOSIS — Z510 Encounter for antineoplastic radiation therapy: Secondary | ICD-10-CM | POA: Diagnosis not present

## 2019-04-03 DIAGNOSIS — C3412 Malignant neoplasm of upper lobe, left bronchus or lung: Secondary | ICD-10-CM | POA: Diagnosis not present

## 2019-04-04 ENCOUNTER — Other Ambulatory Visit: Payer: Self-pay

## 2019-04-04 ENCOUNTER — Ambulatory Visit
Admission: RE | Admit: 2019-04-04 | Discharge: 2019-04-04 | Disposition: A | Discharge status: Home / Self Care | Payer: Medicare Other | Source: Ambulatory Visit | Attending: Radiation Oncology | Admitting: Radiation Oncology

## 2019-04-04 DIAGNOSIS — Z510 Encounter for antineoplastic radiation therapy: Secondary | ICD-10-CM | POA: Diagnosis not present

## 2019-04-04 DIAGNOSIS — C3412 Malignant neoplasm of upper lobe, left bronchus or lung: Secondary | ICD-10-CM | POA: Diagnosis not present

## 2019-04-08 ENCOUNTER — Ambulatory Visit
Admission: RE | Admit: 2019-04-08 | Discharge: 2019-04-08 | Disposition: A | Discharge status: Home / Self Care | Payer: Medicare Other | Source: Ambulatory Visit | Attending: Radiation Oncology | Admitting: Radiation Oncology

## 2019-04-08 ENCOUNTER — Other Ambulatory Visit: Payer: Self-pay | Admitting: Medical Oncology

## 2019-04-08 ENCOUNTER — Inpatient Hospital Stay: Payer: Medicare Other

## 2019-04-08 ENCOUNTER — Other Ambulatory Visit: Payer: Self-pay

## 2019-04-08 ENCOUNTER — Telehealth: Payer: Self-pay | Admitting: *Deleted

## 2019-04-08 ENCOUNTER — Telehealth: Payer: Self-pay | Admitting: Medical Oncology

## 2019-04-08 DIAGNOSIS — Z510 Encounter for antineoplastic radiation therapy: Secondary | ICD-10-CM | POA: Diagnosis not present

## 2019-04-08 DIAGNOSIS — C3492 Malignant neoplasm of unspecified part of left bronchus or lung: Secondary | ICD-10-CM

## 2019-04-08 DIAGNOSIS — Z5189 Encounter for other specified aftercare: Secondary | ICD-10-CM | POA: Diagnosis not present

## 2019-04-08 DIAGNOSIS — D649 Anemia, unspecified: Secondary | ICD-10-CM

## 2019-04-08 DIAGNOSIS — Z5111 Encounter for antineoplastic chemotherapy: Secondary | ICD-10-CM | POA: Diagnosis not present

## 2019-04-08 DIAGNOSIS — C3481 Malignant neoplasm of overlapping sites of right bronchus and lung: Secondary | ICD-10-CM | POA: Diagnosis not present

## 2019-04-08 DIAGNOSIS — Z86010 Personal history of colonic polyps: Secondary | ICD-10-CM | POA: Diagnosis not present

## 2019-04-08 DIAGNOSIS — I700 Atherosclerosis of aorta: Secondary | ICD-10-CM | POA: Diagnosis not present

## 2019-04-08 DIAGNOSIS — D6481 Anemia due to antineoplastic chemotherapy: Secondary | ICD-10-CM | POA: Diagnosis not present

## 2019-04-08 DIAGNOSIS — Z880 Allergy status to penicillin: Secondary | ICD-10-CM | POA: Diagnosis not present

## 2019-04-08 DIAGNOSIS — C3412 Malignant neoplasm of upper lobe, left bronchus or lung: Secondary | ICD-10-CM | POA: Diagnosis not present

## 2019-04-08 DIAGNOSIS — J439 Emphysema, unspecified: Secondary | ICD-10-CM | POA: Diagnosis not present

## 2019-04-08 LAB — CBC WITH DIFFERENTIAL (CANCER CENTER ONLY)
Abs Immature Granulocytes: 0 10*3/uL (ref 0.00–0.07)
Band Neutrophils: 4 %
Basophils Absolute: 0.1 10*3/uL (ref 0.0–0.1)
Basophils Relative: 1 %
Eosinophils Absolute: 0 10*3/uL (ref 0.0–0.5)
Eosinophils Relative: 0 %
HCT: 21 % — ABNORMAL LOW (ref 39.0–52.0)
Hemoglobin: 6.8 g/dL — CL (ref 13.0–17.0)
Lymphocytes Relative: 28 %
Lymphs Abs: 2.3 10*3/uL (ref 0.7–4.0)
MCH: 31.5 pg (ref 26.0–34.0)
MCHC: 32.4 g/dL (ref 30.0–36.0)
MCV: 97.2 fL (ref 80.0–100.0)
Monocytes Absolute: 0.2 10*3/uL (ref 0.1–1.0)
Monocytes Relative: 2 %
Neutro Abs: 5.7 10*3/uL (ref 1.7–17.7)
Neutrophils Relative %: 65 %
Platelet Count: 152 10*3/uL (ref 150–400)
RBC: 2.16 MIL/uL — ABNORMAL LOW (ref 4.22–5.81)
RDW: 16.2 % — ABNORMAL HIGH (ref 11.5–15.5)
WBC Count: 8.2 10*3/uL (ref 4.0–10.5)
nRBC: 0 % (ref 0.0–0.2)

## 2019-04-08 LAB — CMP (CANCER CENTER ONLY)
ALT: 12 U/L (ref 0–44)
AST: 9 U/L — ABNORMAL LOW (ref 15–41)
Albumin: 2.6 g/dL — ABNORMAL LOW (ref 3.5–5.0)
Alkaline Phosphatase: 113 U/L (ref 38–126)
Anion gap: 11 (ref 5–15)
BUN: >85 mg/dL — ABNORMAL HIGH (ref 8–23)
CO2: 16 mmol/L — ABNORMAL LOW (ref 22–32)
Calcium: 8.2 mg/dL — ABNORMAL LOW (ref 8.9–10.3)
Chloride: 108 mmol/L (ref 98–111)
Creatinine: 4.81 mg/dL (ref 0.61–1.24)
GFR, Est AFR Am: 13 mL/min — ABNORMAL LOW (ref >60)
GFR, Estimated: 11 mL/min — ABNORMAL LOW (ref >60)
Glucose, Bld: 136 mg/dL — ABNORMAL HIGH (ref 70–99)
Potassium: 5.6 mmol/L — ABNORMAL HIGH (ref 3.5–5.1)
Sodium: 135 mmol/L (ref 135–145)
Total Bilirubin: 0.4 mg/dL (ref 0.3–1.2)
Total Protein: 6.3 g/dL — ABNORMAL LOW (ref 6.5–8.1)

## 2019-04-08 LAB — ABO/RH: ABO/RH(D): O POS

## 2019-04-08 LAB — SAMPLE TO BLOOD BANK

## 2019-04-08 LAB — PREPARE RBC (CROSSMATCH)

## 2019-04-08 MED ORDER — ACETAMINOPHEN 325 MG PO TABS
ORAL_TABLET | ORAL | Status: AC
  Filled 2019-04-08: qty 2

## 2019-04-08 MED ORDER — DIPHENHYDRAMINE HCL 25 MG PO CAPS
25.0000 mg | ORAL_CAPSULE | Freq: Once | ORAL | Status: AC
  Administered 2019-04-08: 25 mg via ORAL

## 2019-04-08 MED ORDER — SODIUM CHLORIDE 0.9% IV SOLUTION
250.0000 mL | Freq: Once | INTRAVENOUS | Status: AC
  Administered 2019-04-08: 250 mL via INTRAVENOUS
  Filled 2019-04-08: qty 250

## 2019-04-08 MED ORDER — ACETAMINOPHEN 325 MG PO TABS
650.0000 mg | ORAL_TABLET | Freq: Once | ORAL | Status: AC
  Administered 2019-04-08: 650 mg via ORAL

## 2019-04-08 MED ORDER — DIPHENHYDRAMINE HCL 25 MG PO CAPS
ORAL_CAPSULE | ORAL | Status: AC
  Filled 2019-04-08: qty 1

## 2019-04-08 NOTE — Telephone Encounter (Signed)
Weakness/SOB worse this weekend. Last night he was showering and he had to sit on corner chair due to extreme weakness and SOB. . The symptoms have been ongoing for 2 weeks but this weekend he stayed in bed every day. XRT He asked if he should keep xrt appt today and I told him yes. Message to xrt. Added sample to blood bank

## 2019-04-08 NOTE — Patient Instructions (Signed)
Blood Transfusion, Adult, Care After This sheet gives you information about how to care for yourself after your procedure. Your doctor may also give you more specific instructions. If you have problems or questions, contact your doctor. Follow these instructions at home:   Take over-the-counter and prescription medicines only as told by your doctor.  Go back to your normal activities as told by your doctor.  Follow instructions from your doctor about how to take care of the area where an IV tube was put into your vein (insertion site). Make sure you: ? Wash your hands with soap and water before you change your bandage (dressing). If there is no soap and water, use hand sanitizer. ? Change your bandage as told by your doctor.  Check your IV insertion site every day for signs of infection. Check for: ? More redness, swelling, or pain. ? More fluid or blood. ? Warmth. ? Pus or a bad smell. Contact a doctor if:  You have more redness, swelling, or pain around the IV insertion site.  You have more fluid or blood coming from the IV insertion site.  Your IV insertion site feels warm to the touch.  You have pus or a bad smell coming from the IV insertion site.  Your pee (urine) turns pink, red, or brown.  You feel weak after doing your normal activities. Get help right away if:  You have signs of a serious allergic or body defense (immune) system reaction, including: ? Itchiness. ? Hives. ? Trouble breathing. ? Anxiety. ? Pain in your chest or lower back. ? Fever, flushing, and chills. ? Fast pulse. ? Rash. ? Watery poop (diarrhea). ? Throwing up (vomiting). ? Dark pee. ? Serious headache. ? Dizziness. ? Stiff neck. ? Yellow color in your face or the white parts of your eyes (jaundice). Summary  After a blood transfusion, return to your normal activities as told by your doctor.  Every day, check for signs of infection where the IV tube was put into your vein.  Some  signs of infection are warm skin, more redness and pain, more fluid or blood, and pus or a bad smell where the needle went in.  Contact your doctor if you feel weak or have any unusual symptoms. This information is not intended to replace advice given to you by your health care provider. Make sure you discuss any questions you have with your health care provider. Document Released: 11/20/2014 Document Revised: 06/23/2016 Document Reviewed: 06/23/2016 Elsevier Interactive Patient Education  2019 [employer]  

## 2019-04-08 NOTE — Telephone Encounter (Signed)
Received call report from [facility].  "Today's Creat. = 4.81."  Secure chat message sent with results.  Received confirmation.

## 2019-04-09 ENCOUNTER — Other Ambulatory Visit: Payer: Self-pay

## 2019-04-09 ENCOUNTER — Ambulatory Visit
Admission: RE | Admit: 2019-04-09 | Discharge: 2019-04-09 | Disposition: A | Discharge status: Home / Self Care | Payer: Medicare Other | Source: Ambulatory Visit | Attending: Radiation Oncology | Admitting: Radiation Oncology

## 2019-04-09 DIAGNOSIS — C3412 Malignant neoplasm of upper lobe, left bronchus or lung: Secondary | ICD-10-CM | POA: Diagnosis not present

## 2019-04-09 DIAGNOSIS — Z510 Encounter for antineoplastic radiation therapy: Secondary | ICD-10-CM | POA: Diagnosis not present

## 2019-04-09 LAB — BPAM RBC
Blood Product Expiration Date: 202006192359
ISSUE DATE / TIME: 202005261322
Unit Type and Rh: 5100

## 2019-04-09 LAB — TYPE AND SCREEN
ABO/RH(D): O POS
Antibody Screen: NEGATIVE
Unit division: 0

## 2019-04-10 ENCOUNTER — Telehealth: Payer: Self-pay | Admitting: Medical Oncology

## 2019-04-10 ENCOUNTER — Other Ambulatory Visit: Payer: Self-pay

## 2019-04-10 ENCOUNTER — Ambulatory Visit
Admission: RE | Admit: 2019-04-10 | Discharge: 2019-04-10 | Disposition: A | Discharge status: Home / Self Care | Payer: Medicare Other | Source: Ambulatory Visit | Attending: Radiation Oncology | Admitting: Radiation Oncology

## 2019-04-10 ENCOUNTER — Ambulatory Visit: Payer: Medicare Other

## 2019-04-10 DIAGNOSIS — Z510 Encounter for antineoplastic radiation therapy: Secondary | ICD-10-CM | POA: Diagnosis not present

## 2019-04-10 DIAGNOSIS — C3412 Malignant neoplasm of upper lobe, left bronchus or lung: Secondary | ICD-10-CM | POA: Diagnosis not present

## 2019-04-10 NOTE — Telephone Encounter (Signed)
Concerned about creatinine rising . What needs to be done?  Is it reversible? Coming in for xrt soon.

## 2019-04-10 NOTE — Telephone Encounter (Signed)
Elevated creatinine .Pt thinks the Neulasta is causing his elevated creatinine. He says every time he takes the injection his urine turns very dark.   Per Julien Nordmann he needs to f/u with PCP and to push fluids. He can get IVF tomorrow. Schedule message sent.

## 2019-04-11 ENCOUNTER — Ambulatory Visit
Admission: RE | Admit: 2019-04-11 | Discharge: 2019-04-11 | Disposition: A | Discharge status: Home / Self Care | Payer: Medicare Other | Source: Ambulatory Visit | Attending: Radiation Oncology | Admitting: Radiation Oncology

## 2019-04-11 ENCOUNTER — Telehealth: Payer: Self-pay | Admitting: Radiation Oncology

## 2019-04-11 ENCOUNTER — Other Ambulatory Visit: Payer: Self-pay

## 2019-04-11 ENCOUNTER — Inpatient Hospital Stay: Payer: Medicare Other

## 2019-04-11 ENCOUNTER — Other Ambulatory Visit: Payer: Self-pay | Admitting: Radiation Oncology

## 2019-04-11 VITALS — BP 127/67 | HR 80 | Temp 98.7°F | Resp 18 | Ht 69.0 in | Wt 208.7 lb

## 2019-04-11 DIAGNOSIS — D6481 Anemia due to antineoplastic chemotherapy: Secondary | ICD-10-CM | POA: Diagnosis not present

## 2019-04-11 DIAGNOSIS — E876 Hypokalemia: Secondary | ICD-10-CM | POA: Diagnosis not present

## 2019-04-11 DIAGNOSIS — C3411 Malignant neoplasm of upper lobe, right bronchus or lung: Secondary | ICD-10-CM | POA: Diagnosis not present

## 2019-04-11 DIAGNOSIS — C3412 Malignant neoplasm of upper lobe, left bronchus or lung: Secondary | ICD-10-CM | POA: Diagnosis not present

## 2019-04-11 DIAGNOSIS — D509 Iron deficiency anemia, unspecified: Secondary | ICD-10-CM | POA: Diagnosis not present

## 2019-04-11 DIAGNOSIS — Z03818 Encounter for observation for suspected exposure to other biological agents ruled out: Secondary | ICD-10-CM | POA: Diagnosis not present

## 2019-04-11 DIAGNOSIS — I2510 Atherosclerotic heart disease of native coronary artery without angina pectoris: Secondary | ICD-10-CM | POA: Diagnosis not present

## 2019-04-11 DIAGNOSIS — E785 Hyperlipidemia, unspecified: Secondary | ICD-10-CM | POA: Diagnosis not present

## 2019-04-11 DIAGNOSIS — M109 Gout, unspecified: Secondary | ICD-10-CM | POA: Diagnosis not present

## 2019-04-11 DIAGNOSIS — D6959 Other secondary thrombocytopenia: Secondary | ICD-10-CM | POA: Diagnosis not present

## 2019-04-11 DIAGNOSIS — I712 Thoracic aortic aneurysm, without rupture: Secondary | ICD-10-CM | POA: Diagnosis not present

## 2019-04-11 DIAGNOSIS — N179 Acute kidney failure, unspecified: Secondary | ICD-10-CM | POA: Diagnosis not present

## 2019-04-11 DIAGNOSIS — J439 Emphysema, unspecified: Secondary | ICD-10-CM | POA: Diagnosis not present

## 2019-04-11 DIAGNOSIS — D649 Anemia, unspecified: Secondary | ICD-10-CM | POA: Diagnosis not present

## 2019-04-11 DIAGNOSIS — I252 Old myocardial infarction: Secondary | ICD-10-CM | POA: Diagnosis not present

## 2019-04-11 DIAGNOSIS — D72829 Elevated white blood cell count, unspecified: Secondary | ICD-10-CM | POA: Diagnosis not present

## 2019-04-11 DIAGNOSIS — I120 Hypertensive chronic kidney disease with stage 5 chronic kidney disease or end stage renal disease: Secondary | ICD-10-CM | POA: Diagnosis not present

## 2019-04-11 DIAGNOSIS — N186 End stage renal disease: Secondary | ICD-10-CM | POA: Diagnosis not present

## 2019-04-11 DIAGNOSIS — E039 Hypothyroidism, unspecified: Secondary | ICD-10-CM | POA: Diagnosis not present

## 2019-04-11 DIAGNOSIS — K219 Gastro-esophageal reflux disease without esophagitis: Secondary | ICD-10-CM | POA: Diagnosis not present

## 2019-04-11 DIAGNOSIS — E872 Acidosis: Secondary | ICD-10-CM | POA: Diagnosis not present

## 2019-04-11 DIAGNOSIS — Z85118 Personal history of other malignant neoplasm of bronchus and lung: Secondary | ICD-10-CM | POA: Diagnosis not present

## 2019-04-11 DIAGNOSIS — Z1159 Encounter for screening for other viral diseases: Secondary | ICD-10-CM | POA: Diagnosis not present

## 2019-04-11 DIAGNOSIS — N170 Acute kidney failure with tubular necrosis: Secondary | ICD-10-CM | POA: Diagnosis not present

## 2019-04-11 DIAGNOSIS — E860 Dehydration: Secondary | ICD-10-CM

## 2019-04-11 DIAGNOSIS — R319 Hematuria, unspecified: Secondary | ICD-10-CM | POA: Diagnosis not present

## 2019-04-11 DIAGNOSIS — D631 Anemia in chronic kidney disease: Secondary | ICD-10-CM | POA: Diagnosis not present

## 2019-04-11 DIAGNOSIS — T451X5A Adverse effect of antineoplastic and immunosuppressive drugs, initial encounter: Secondary | ICD-10-CM | POA: Diagnosis not present

## 2019-04-11 MED ORDER — SUCRALFATE 1 G PO TABS: 1.0000 g | ORAL_TABLET | Freq: Three times a day (TID) | ORAL | 2 refills | Status: DC

## 2019-04-11 MED ORDER — SODIUM CHLORIDE 0.9 % IV SOLN
Freq: Once | INTRAVENOUS | Status: AC
  Administered 2019-04-11: 09:00:00 via INTRAVENOUS
  Filled 2019-04-11: qty 250

## 2019-04-11 NOTE — Patient Instructions (Signed)

## 2019-04-11 NOTE — Telephone Encounter (Signed)
Received voicemail message from patient requesting clarification on new prescription given by Dr. Tammi Klippel this morning. Phoned patient back. Instructed patient to break Carafate tablet up and dissolve it in 10-15 cc of water and drink it 5-10 minutes before meals and before bedtime. Patient verbalized understanding via [facility] and expressed appreciation for the return call.

## 2019-04-11 NOTE — Progress Notes (Signed)
Pt able to drink during infusion.  Received 1L IVF NS today, tolerated well.  Reports feeling a bit better at end of infusion.  A&OX4.  Ambulatory w/steady gait to lobby to check in for radiation appt.

## 2019-04-14 ENCOUNTER — Encounter (HOSPITAL_COMMUNITY): Payer: Self-pay | Admitting: Emergency Medicine

## 2019-04-14 ENCOUNTER — Emergency Department (HOSPITAL_COMMUNITY): Payer: Medicare Other

## 2019-04-14 ENCOUNTER — Other Ambulatory Visit: Payer: Self-pay

## 2019-04-14 ENCOUNTER — Inpatient Hospital Stay: Payer: Medicare Other | Attending: Internal Medicine

## 2019-04-14 ENCOUNTER — Ambulatory Visit
Admission: RE | Admit: 2019-04-14 | Discharge: 2019-04-14 | Disposition: A | Discharge status: Home / Self Care | Payer: Medicare Other | Source: Ambulatory Visit | Attending: Radiation Oncology | Admitting: Radiation Oncology

## 2019-04-14 ENCOUNTER — Inpatient Hospital Stay (HOSPITAL_COMMUNITY)
Admission: EM | Admit: 2019-04-14 | Discharge: 2019-04-26 | DRG: 674 | Disposition: A | Discharge status: Home / Self Care | Payer: Medicare Other | Attending: Student | Admitting: Student

## 2019-04-14 ENCOUNTER — Telehealth: Payer: Self-pay | Admitting: Medical Oncology

## 2019-04-14 ENCOUNTER — Telehealth: Payer: Self-pay | Admitting: *Deleted

## 2019-04-14 DIAGNOSIS — N019 Rapidly progressive nephritic syndrome with unspecified morphologic changes: Secondary | ICD-10-CM | POA: Diagnosis not present

## 2019-04-14 DIAGNOSIS — K219 Gastro-esophageal reflux disease without esophagitis: Secondary | ICD-10-CM

## 2019-04-14 DIAGNOSIS — Z03818 Encounter for observation for suspected exposure to other biological agents ruled out: Secondary | ICD-10-CM | POA: Diagnosis not present

## 2019-04-14 DIAGNOSIS — E785 Hyperlipidemia, unspecified: Secondary | ICD-10-CM | POA: Diagnosis not present

## 2019-04-14 DIAGNOSIS — N183 Chronic kidney disease, stage 3 unspecified: Secondary | ICD-10-CM

## 2019-04-14 DIAGNOSIS — C3481 Malignant neoplasm of overlapping sites of right bronchus and lung: Secondary | ICD-10-CM | POA: Diagnosis not present

## 2019-04-14 DIAGNOSIS — N186 End stage renal disease: Secondary | ICD-10-CM | POA: Diagnosis not present

## 2019-04-14 DIAGNOSIS — I10 Essential (primary) hypertension: Secondary | ICD-10-CM | POA: Diagnosis not present

## 2019-04-14 DIAGNOSIS — I712 Thoracic aortic aneurysm, without rupture: Secondary | ICD-10-CM | POA: Diagnosis present

## 2019-04-14 DIAGNOSIS — E872 Acidosis: Secondary | ICD-10-CM

## 2019-04-14 DIAGNOSIS — I2510 Atherosclerotic heart disease of native coronary artery without angina pectoris: Secondary | ICD-10-CM | POA: Diagnosis not present

## 2019-04-14 DIAGNOSIS — R944 Abnormal results of kidney function studies: Secondary | ICD-10-CM | POA: Diagnosis not present

## 2019-04-14 DIAGNOSIS — Z7982 Long term (current) use of aspirin: Secondary | ICD-10-CM

## 2019-04-14 DIAGNOSIS — M47816 Spondylosis without myelopathy or radiculopathy, lumbar region: Secondary | ICD-10-CM | POA: Diagnosis not present

## 2019-04-14 DIAGNOSIS — N170 Acute kidney failure with tubular necrosis: Principal | ICD-10-CM | POA: Diagnosis present

## 2019-04-14 DIAGNOSIS — R319 Hematuria, unspecified: Secondary | ICD-10-CM | POA: Diagnosis present

## 2019-04-14 DIAGNOSIS — M1A9XX0 Chronic gout, unspecified, without tophus (tophi): Secondary | ICD-10-CM | POA: Diagnosis not present

## 2019-04-14 DIAGNOSIS — Z79899 Other long term (current) drug therapy: Secondary | ICD-10-CM

## 2019-04-14 DIAGNOSIS — Z1159 Encounter for screening for other viral diseases: Secondary | ICD-10-CM

## 2019-04-14 DIAGNOSIS — E876 Hypokalemia: Secondary | ICD-10-CM | POA: Diagnosis not present

## 2019-04-14 DIAGNOSIS — D72829 Elevated white blood cell count, unspecified: Secondary | ICD-10-CM | POA: Diagnosis present

## 2019-04-14 DIAGNOSIS — E782 Mixed hyperlipidemia: Secondary | ICD-10-CM | POA: Diagnosis not present

## 2019-04-14 DIAGNOSIS — Z7951 Long term (current) use of inhaled steroids: Secondary | ICD-10-CM | POA: Diagnosis not present

## 2019-04-14 DIAGNOSIS — N179 Acute kidney failure, unspecified: Secondary | ICD-10-CM | POA: Diagnosis not present

## 2019-04-14 DIAGNOSIS — Z5189 Encounter for other specified aftercare: Secondary | ICD-10-CM | POA: Diagnosis present

## 2019-04-14 DIAGNOSIS — E039 Hypothyroidism, unspecified: Secondary | ICD-10-CM | POA: Diagnosis present

## 2019-04-14 DIAGNOSIS — D6959 Other secondary thrombocytopenia: Secondary | ICD-10-CM | POA: Diagnosis present

## 2019-04-14 DIAGNOSIS — K439 Ventral hernia without obstruction or gangrene: Secondary | ICD-10-CM | POA: Diagnosis not present

## 2019-04-14 DIAGNOSIS — Z992 Dependence on renal dialysis: Secondary | ICD-10-CM

## 2019-04-14 DIAGNOSIS — I714 Abdominal aortic aneurysm, without rupture: Secondary | ICD-10-CM | POA: Diagnosis not present

## 2019-04-14 DIAGNOSIS — M109 Gout, unspecified: Secondary | ICD-10-CM | POA: Diagnosis present

## 2019-04-14 DIAGNOSIS — D631 Anemia in chronic kidney disease: Secondary | ICD-10-CM | POA: Diagnosis present

## 2019-04-14 DIAGNOSIS — D696 Thrombocytopenia, unspecified: Secondary | ICD-10-CM | POA: Diagnosis not present

## 2019-04-14 DIAGNOSIS — C3490 Malignant neoplasm of unspecified part of unspecified bronchus or lung: Secondary | ICD-10-CM | POA: Diagnosis not present

## 2019-04-14 DIAGNOSIS — C3411 Malignant neoplasm of upper lobe, right bronchus or lung: Secondary | ICD-10-CM | POA: Diagnosis not present

## 2019-04-14 DIAGNOSIS — D649 Anemia, unspecified: Secondary | ICD-10-CM | POA: Insufficient documentation

## 2019-04-14 DIAGNOSIS — Z87891 Personal history of nicotine dependence: Secondary | ICD-10-CM

## 2019-04-14 DIAGNOSIS — K8689 Other specified diseases of pancreas: Secondary | ICD-10-CM | POA: Diagnosis not present

## 2019-04-14 DIAGNOSIS — I129 Hypertensive chronic kidney disease with stage 1 through stage 4 chronic kidney disease, or unspecified chronic kidney disease: Secondary | ICD-10-CM | POA: Insufficient documentation

## 2019-04-14 DIAGNOSIS — N189 Chronic kidney disease, unspecified: Secondary | ICD-10-CM | POA: Diagnosis not present

## 2019-04-14 DIAGNOSIS — I252 Old myocardial infarction: Secondary | ICD-10-CM

## 2019-04-14 DIAGNOSIS — Z955 Presence of coronary angioplasty implant and graft: Secondary | ICD-10-CM | POA: Diagnosis not present

## 2019-04-14 DIAGNOSIS — Z923 Personal history of irradiation: Secondary | ICD-10-CM | POA: Diagnosis not present

## 2019-04-14 DIAGNOSIS — C3492 Malignant neoplasm of unspecified part of left bronchus or lung: Secondary | ICD-10-CM

## 2019-04-14 DIAGNOSIS — Z5111 Encounter for antineoplastic chemotherapy: Secondary | ICD-10-CM | POA: Diagnosis not present

## 2019-04-14 DIAGNOSIS — R809 Proteinuria, unspecified: Secondary | ICD-10-CM | POA: Diagnosis not present

## 2019-04-14 DIAGNOSIS — D509 Iron deficiency anemia, unspecified: Secondary | ICD-10-CM | POA: Diagnosis present

## 2019-04-14 DIAGNOSIS — Z4901 Encounter for fitting and adjustment of extracorporeal dialysis catheter: Secondary | ICD-10-CM | POA: Diagnosis not present

## 2019-04-14 DIAGNOSIS — D6481 Anemia due to antineoplastic chemotherapy: Secondary | ICD-10-CM | POA: Diagnosis present

## 2019-04-14 DIAGNOSIS — J439 Emphysema, unspecified: Secondary | ICD-10-CM | POA: Diagnosis not present

## 2019-04-14 DIAGNOSIS — Z823 Family history of stroke: Secondary | ICD-10-CM

## 2019-04-14 DIAGNOSIS — E02 Subclinical iodine-deficiency hypothyroidism: Secondary | ICD-10-CM | POA: Diagnosis not present

## 2019-04-14 DIAGNOSIS — I120 Hypertensive chronic kidney disease with stage 5 chronic kidney disease or end stage renal disease: Secondary | ICD-10-CM | POA: Diagnosis not present

## 2019-04-14 DIAGNOSIS — Z9221 Personal history of antineoplastic chemotherapy: Secondary | ICD-10-CM | POA: Diagnosis not present

## 2019-04-14 DIAGNOSIS — Z510 Encounter for antineoplastic radiation therapy: Secondary | ICD-10-CM | POA: Insufficient documentation

## 2019-04-14 DIAGNOSIS — T451X5A Adverse effect of antineoplastic and immunosuppressive drugs, initial encounter: Secondary | ICD-10-CM | POA: Diagnosis present

## 2019-04-14 DIAGNOSIS — Z801 Family history of malignant neoplasm of trachea, bronchus and lung: Secondary | ICD-10-CM

## 2019-04-14 DIAGNOSIS — N185 Chronic kidney disease, stage 5: Secondary | ICD-10-CM | POA: Diagnosis not present

## 2019-04-14 DIAGNOSIS — Z85118 Personal history of other malignant neoplasm of bronchus and lung: Secondary | ICD-10-CM | POA: Diagnosis not present

## 2019-04-14 DIAGNOSIS — C3412 Malignant neoplasm of upper lobe, left bronchus or lung: Secondary | ICD-10-CM | POA: Diagnosis not present

## 2019-04-14 DIAGNOSIS — E038 Other specified hypothyroidism: Secondary | ICD-10-CM | POA: Diagnosis not present

## 2019-04-14 DIAGNOSIS — N281 Cyst of kidney, acquired: Secondary | ICD-10-CM | POA: Diagnosis not present

## 2019-04-14 DIAGNOSIS — Z8679 Personal history of other diseases of the circulatory system: Secondary | ICD-10-CM

## 2019-04-14 DIAGNOSIS — Z79890 Hormone replacement therapy (postmenopausal): Secondary | ICD-10-CM

## 2019-04-14 HISTORY — DX: Encounter for other specified aftercare: Z51.89

## 2019-04-14 HISTORY — DX: Anemia, unspecified: D64.9

## 2019-04-14 HISTORY — DX: Malignant (primary) neoplasm, unspecified: C80.1

## 2019-04-14 LAB — CBC WITH DIFFERENTIAL/PLATELET
Abs Immature Granulocytes: 0.15 10*3/uL — ABNORMAL HIGH (ref 0.00–0.07)
Basophils Absolute: 0 10*3/uL (ref 0.0–0.1)
Basophils Relative: 0 %
Eosinophils Absolute: 0.3 10*3/uL (ref 0.0–0.5)
Eosinophils Relative: 2 %
HCT: 19.9 % — ABNORMAL LOW (ref 39.0–52.0)
Hemoglobin: 6.9 g/dL — CL (ref 13.0–17.0)
Immature Granulocytes: 1 %
Lymphocytes Relative: 14 %
Lymphs Abs: 1.7 10*3/uL (ref 0.7–4.0)
MCH: 31.8 pg (ref 26.0–34.0)
MCHC: 34.7 g/dL (ref 30.0–36.0)
MCV: 91.7 fL (ref 80.0–100.0)
Monocytes Absolute: 1.1 10*3/uL — ABNORMAL HIGH (ref 0.1–1.0)
Monocytes Relative: 9 %
Neutro Abs: 8.9 10*3/uL — ABNORMAL HIGH (ref 1.7–7.7)
Neutrophils Relative %: 74 %
Platelets: 32 10*3/uL — ABNORMAL LOW (ref 150–400)
RBC: 2.17 MIL/uL — ABNORMAL LOW (ref 4.22–5.81)
RDW: 15.9 % — ABNORMAL HIGH (ref 11.5–15.5)
WBC: 12.2 10*3/uL — ABNORMAL HIGH (ref 4.0–10.5)
nRBC: 0 % (ref 0.0–0.2)

## 2019-04-14 LAB — URINALYSIS, ROUTINE W REFLEX MICROSCOPIC
Bilirubin Urine: NEGATIVE
Glucose, UA: 50 mg/dL — AB
Ketones, ur: NEGATIVE mg/dL
Leukocytes,Ua: NEGATIVE
Nitrite: NEGATIVE
Protein, ur: >=300 mg/dL — AB
RBC / HPF: >50 RBC/hpf — ABNORMAL HIGH (ref 0–5)
Specific Gravity, Urine: 1.008 (ref 1.005–1.030)
pH: 7 (ref 5.0–8.0)

## 2019-04-14 LAB — CBC WITH DIFFERENTIAL (CANCER CENTER ONLY)
Abs Immature Granulocytes: 0.12 10*3/uL — ABNORMAL HIGH (ref 0.00–0.07)
Basophils Absolute: 0 10*3/uL (ref 0.0–0.1)
Basophils Relative: 0 %
Eosinophils Absolute: 0.3 10*3/uL (ref 0.0–0.5)
Eosinophils Relative: 2 %
HCT: 20.3 % — ABNORMAL LOW (ref 39.0–52.0)
Hemoglobin: 6.8 g/dL — CL (ref 13.0–17.0)
Immature Granulocytes: 1 %
Lymphocytes Relative: 14 %
Lymphs Abs: 1.6 10*3/uL (ref 0.7–4.0)
MCH: 31.1 pg (ref 26.0–34.0)
MCHC: 33.5 g/dL (ref 30.0–36.0)
MCV: 92.7 fL (ref 80.0–100.0)
Monocytes Absolute: 0.9 10*3/uL (ref 0.1–1.0)
Monocytes Relative: 8 %
Neutro Abs: 8.6 10*3/uL — ABNORMAL HIGH (ref 1.7–7.7)
Neutrophils Relative %: 75 %
Platelet Count: 34 10*3/uL — ABNORMAL LOW (ref 150–400)
RBC: 2.19 MIL/uL — ABNORMAL LOW (ref 4.22–5.81)
RDW: 15.9 % — ABNORMAL HIGH (ref 11.5–15.5)
WBC Count: 11.5 10*3/uL — ABNORMAL HIGH (ref 4.0–10.5)
nRBC: 0 % (ref 0.0–0.2)

## 2019-04-14 LAB — CMP (CANCER CENTER ONLY)
ALT: 9 U/L (ref 0–44)
AST: 11 U/L — ABNORMAL LOW (ref 15–41)
Albumin: 2.4 g/dL — ABNORMAL LOW (ref 3.5–5.0)
Alkaline Phosphatase: 97 U/L (ref 38–126)
Anion gap: 10 (ref 5–15)
BUN: 80 mg/dL — ABNORMAL HIGH (ref 8–23)
CO2: 18 mmol/L — ABNORMAL LOW (ref 22–32)
Calcium: 7.7 mg/dL — ABNORMAL LOW (ref 8.9–10.3)
Chloride: 106 mmol/L (ref 98–111)
Creatinine: 9.52 mg/dL (ref 0.61–1.24)
GFR, Est AFR Am: 6 mL/min — ABNORMAL LOW (ref >60)
GFR, Estimated: 5 mL/min — ABNORMAL LOW (ref >60)
Glucose, Bld: 152 mg/dL — ABNORMAL HIGH (ref 70–99)
Potassium: 4.4 mmol/L (ref 3.5–5.1)
Sodium: 134 mmol/L — ABNORMAL LOW (ref 135–145)
Total Bilirubin: 0.3 mg/dL (ref 0.3–1.2)
Total Protein: 5.9 g/dL — ABNORMAL LOW (ref 6.5–8.1)

## 2019-04-14 LAB — NA AND K (SODIUM & POTASSIUM), RAND UR
Potassium Urine: 27 mmol/L
Sodium, Ur: 69 mmol/L

## 2019-04-14 LAB — COMPREHENSIVE METABOLIC PANEL
ALT: 12 U/L (ref 0–44)
AST: 13 U/L — ABNORMAL LOW (ref 15–41)
Albumin: 2.7 g/dL — ABNORMAL LOW (ref 3.5–5.0)
Alkaline Phosphatase: 90 U/L (ref 38–126)
Anion gap: 14 (ref 5–15)
BUN: 82 mg/dL — ABNORMAL HIGH (ref 8–23)
CO2: 16 mmol/L — ABNORMAL LOW (ref 22–32)
Calcium: 7.8 mg/dL — ABNORMAL LOW (ref 8.9–10.3)
Chloride: 103 mmol/L (ref 98–111)
Creatinine, Ser: 9.86 mg/dL — ABNORMAL HIGH (ref 0.61–1.24)
GFR calc Af Amer: 5 mL/min — ABNORMAL LOW (ref >60)
GFR calc non Af Amer: 5 mL/min — ABNORMAL LOW (ref >60)
Glucose, Bld: 113 mg/dL — ABNORMAL HIGH (ref 70–99)
Potassium: 4.4 mmol/L (ref 3.5–5.1)
Sodium: 133 mmol/L — ABNORMAL LOW (ref 135–145)
Total Bilirubin: 0.3 mg/dL (ref 0.3–1.2)
Total Protein: 6.3 g/dL — ABNORMAL LOW (ref 6.5–8.1)

## 2019-04-14 LAB — ABO/RH: ABO/RH(D): O POS

## 2019-04-14 LAB — PREPARE RBC (CROSSMATCH)

## 2019-04-14 LAB — CHLORIDE, URINE, RANDOM: Chloride Urine: 71 mmol/L

## 2019-04-14 LAB — SARS CORONAVIRUS 2 BY RT PCR (HOSPITAL ORDER, PERFORMED IN ~~LOC~~ HOSPITAL LAB): SARS Coronavirus 2: NEGATIVE

## 2019-04-14 MED ORDER — LEVOTHYROXINE SODIUM 75 MCG PO TABS
175.0000 ug | ORAL_TABLET | Freq: Every day | ORAL | Status: DC
  Administered 2019-04-15 – 2019-04-26 (×11): 175 ug via ORAL
  Filled 2019-04-14 (×11): qty 1

## 2019-04-14 MED ORDER — SODIUM CHLORIDE 0.9 % IV SOLN
INTRAVENOUS | Status: DC
  Administered 2019-04-14: 23:00:00 via INTRAVENOUS

## 2019-04-14 MED ORDER — SUCRALFATE 1 G PO TABS
1.0000 g | ORAL_TABLET | Freq: Three times a day (TID) | ORAL | Status: DC
  Administered 2019-04-14 – 2019-04-26 (×40): 1 g via ORAL
  Filled 2019-04-14 (×42): qty 1

## 2019-04-14 MED ORDER — SODIUM BICARBONATE 650 MG PO TABS
650.0000 mg | ORAL_TABLET | Freq: Three times a day (TID) | ORAL | Status: DC
  Administered 2019-04-14: 650 mg via ORAL
  Filled 2019-04-14 (×3): qty 1

## 2019-04-14 MED ORDER — ASPIRIN EC 81 MG PO TBEC: 81.0000 mg | DELAYED_RELEASE_TABLET | Freq: Every day | ORAL | Status: DC

## 2019-04-14 MED ORDER — HEPARIN SODIUM (PORCINE) 5000 UNIT/ML IJ SOLN: 5000.0000 [IU] | Freq: Three times a day (TID) | INTRAMUSCULAR | Status: DC

## 2019-04-14 MED ORDER — METOPROLOL TARTRATE 12.5 MG HALF TABLET
12.5000 mg | ORAL_TABLET | Freq: Two times a day (BID) | ORAL | Status: DC
  Administered 2019-04-14 – 2019-04-22 (×16): 12.5 mg via ORAL
  Filled 2019-04-14 (×16): qty 1

## 2019-04-14 MED ORDER — CALCITRIOL 0.25 MCG PO CAPS
0.2500 ug | ORAL_CAPSULE | Freq: Every day | ORAL | Status: DC
  Administered 2019-04-15 – 2019-04-26 (×10): 0.25 ug via ORAL
  Filled 2019-04-14 (×11): qty 1

## 2019-04-14 MED ORDER — TRAZODONE HCL 50 MG PO TABS
25.0000 mg | ORAL_TABLET | Freq: Every evening | ORAL | Status: DC | PRN
  Administered 2019-04-14 – 2019-04-16 (×3): 25 mg via ORAL
  Filled 2019-04-14 (×3): qty 1

## 2019-04-14 MED ORDER — SODIUM CHLORIDE 0.9 % IV SOLN
10.0000 mL/h | Freq: Once | INTRAVENOUS | Status: AC
  Administered 2019-04-14: 10 mL/h via INTRAVENOUS

## 2019-04-14 MED ORDER — SIMVASTATIN 20 MG PO TABS
10.0000 mg | ORAL_TABLET | Freq: Every day | ORAL | Status: DC
  Administered 2019-04-15 – 2019-04-25 (×11): 10 mg via ORAL
  Filled 2019-04-14 (×11): qty 1

## 2019-04-14 MED ORDER — SODIUM CHLORIDE 0.9 % IV BOLUS
1000.0000 mL | Freq: Once | INTRAVENOUS | Status: AC
  Administered 2019-04-14: 1000 mL via INTRAVENOUS

## 2019-04-14 MED ORDER — ACETAMINOPHEN 325 MG PO TABS
650.0000 mg | ORAL_TABLET | Freq: Four times a day (QID) | ORAL | Status: DC | PRN
  Administered 2019-04-18 – 2019-04-24 (×6): 650 mg via ORAL
  Filled 2019-04-14 (×7): qty 2

## 2019-04-14 MED ORDER — PANTOPRAZOLE SODIUM 40 MG PO TBEC
40.0000 mg | DELAYED_RELEASE_TABLET | Freq: Every day | ORAL | Status: DC
  Administered 2019-04-15 – 2019-04-26 (×10): 40 mg via ORAL
  Filled 2019-04-14 (×11): qty 1

## 2019-04-14 MED ORDER — ALLOPURINOL 100 MG PO TABS: 100.0000 mg | ORAL_TABLET | ORAL | Status: DC

## 2019-04-14 NOTE — ED Notes (Signed)
Signature pad in room not working.  Consent for blood and transfer obtained by 2 RN's and signed as verbal consent.

## 2019-04-14 NOTE — ED Provider Notes (Signed)
[facility] EMERGENCY DEPARTMENT Provider Note   CSN: [id number] Arrival date & time: 04/14/19  1151    History   Chief Complaint Chief Complaint  Patient presents with  . Abnormal Lab    HPI Jeremy Johnson is a 73 y.o. male.     Patient sent in by Dr. Earlie Server for hematology oncology clinic at [facility].  Patient is followed there for small cell lung cancer.  Receiving chemotherapy and radiation treatment.  Patient receives radiation treatment daily.  Patient is next due for chemo in a week.  Patient had significant abnormal labs.  Was noted to have anemia and some renal failure problems.  Patient though without any specific complaints.  Is urinating fine as far as he is concerned.  Patient without any fevers no upper respiratory symptoms consistent with COVID-19 infection.     Past Medical History:  Diagnosis Date  . Anemia   . Blood transfusion without reported diagnosis   . CAD (coronary artery disease)    STENT... MID CIRCUMFLEX...1997  . Cancer ([place])   . Chronic kidney disease    STAGE 3  . Degenerative joint disease (DJD) of lumbar spine   . GERD (gastroesophageal reflux disease)   . Gout   . Hyperlipidemia   . Hypertension   . Hypothyroidism   . Incisional hernia   . Leukocytosis    CHRONIC MILD  . Myocardial infarction [facility])    1997    Patient Active Problem List   Diagnosis Date Noted  . Small cell lung cancer, left ([place]) 02/06/2019  . Encounter for antineoplastic chemotherapy 02/06/2019  . Goals of care, counseling/discussion 02/06/2019  . Special screening for malignant neoplasms, colon   . Chronic kidney disease   . GERD (gastroesophageal reflux disease)   . CAD (coronary artery disease)   . Hyperlipidemia   . Hypertension   . Gout   . Hypothyroidism   . Degenerative joint disease (DJD) of lumbar spine   . Leukocytosis   . HYPOTHYROIDISM 02/02/2010  . HYPERLIPIDEMIA 02/02/2010  . GOUT, UNSPECIFIED 02/02/2010  . HYPERTENSION,  UNSPECIFIED 02/02/2010  . CORONARY ARTERY DISEASE 02/02/2010  . GERD 02/02/2010  . ABDOMINAL AORTIC ANEURYSM REPAIR, HX OF 02/02/2010    Past Surgical History:  Procedure Laterality Date  . ABDOMINAL AORTIC ANEURYSM REPAIR  2006  . BIOPSY  09/30/2018   Procedure: BIOPSY;  Surgeon: Danie Binder, MD;  [facility];  Service: Endoscopy;;  ascending colon  . COLONOSCOPY  2008  . COLONOSCOPY N/A 09/30/2018   Procedure: COLONOSCOPY;  Surgeon: Danie Binder, MD;  [facility];  Service: Endoscopy;  Laterality: N/A;  9:00  . CORONARY ANGIOPLASTY WITH STENT PLACEMENT  2008   MID CIRCUMFLEX  . POLYPECTOMY  09/30/2018   Procedure: POLYPECTOMY;  Surgeon: Danie Binder, MD;  [facility];  Service: Endoscopy;;  colon  . VIDEO BRONCHOSCOPY WITH ENDOBRONCHIAL NAVIGATION N/A 01/27/2019   Procedure: VIDEO BRONCHOSCOPY WITH ENDOBRONCHIAL NAVIGATION;  Surgeon: Grace Isaac, MD;  Location: [place];  Service: Thoracic;  Laterality: N/A;  . VIDEO BRONCHOSCOPY WITH ENDOBRONCHIAL ULTRASOUND N/A 01/27/2019   Procedure: VIDEO BRONCHOSCOPY WITH ENDOBRONCHIAL ULTRASOUND;  Surgeon: Grace Isaac, MD;  Location: [place];  Service: Thoracic;  Laterality: N/A;        Home Medications    Prior to Admission medications   Medication Sig Start Date End Date Taking? Authorizing Provider  allopurinol (ZYLOPRIM) 300 MG tablet Take 300 mg by mouth daily.  Yes [provider]  amLODipine (NORVASC) 5 MG tablet Take 5 mg by mouth daily.  03/12/18  Yes [provider]  aspirin EC 81 MG tablet Take 81 mg by mouth daily.   Yes [provider]  calcitRIOL (ROCALTROL) 0.25 MCG capsule Take 0.25 mcg by mouth daily.  11/25/18  Yes [provider]  levothyroxine (SYNTHROID, LEVOTHROID) 175 MCG tablet Take 175 mcg by mouth daily before breakfast.   Yes [provider]  losartan-hydrochlorothiazide (HYZAAR) 100-25 MG tablet Take 1 tablet by  mouth daily.   Yes [provider]  metoprolol tartrate (LOPRESSOR) 50 MG tablet Take 25 mg by mouth 2 (two) times daily.    Yes [provider]  mometasone (ELOCON) 0.1 % ointment Apply 1 application topically 2 (two) times daily as needed (for eczema).  07/11/18  Yes [provider]  omeprazole (PRILOSEC) 20 MG capsule Take 20 mg by mouth daily.   Yes [provider]  prochlorperazine (COMPAZINE) 10 MG tablet Take 1 tablet (10 mg total) by mouth every 6 (six) hours as needed for nausea or vomiting. 02/06/19  Yes Curt Bears, MD  simvastatin (ZOCOR) 20 MG tablet Take 20 mg by mouth daily.  03/12/18  Yes [provider]  sucralfate (CARAFATE) 1 g tablet Take 1 tablet (1 g total) by mouth 4 (four) times daily -  with meals and at bedtime. 5 min before meals for radiation induced esophagitis 04/11/19  Yes Tyler Pita, MD    Family History Family History  Problem Relation Age of Onset  . Stroke Brother   . Lung cancer Sister 12       lung cancer/former    Social History Social History   Tobacco Use  . Smoking status: Former Smoker    Packs/day: 0.50    Years: 54.00    Pack years: 27.00    Last attempt to quit: 09/17/2017    Years since quitting: 1.5  . Smokeless tobacco: Never Used  Substance Use Topics  . Alcohol use: Yes    Comment: occasional  . Drug use: No     Allergies   Penicillins   Review of Systems Review of Systems  Constitutional: Negative for chills and fever.  HENT: Negative for rhinorrhea and sore throat.   Eyes: Negative for visual disturbance.  Respiratory: Negative for cough and shortness of breath.   Cardiovascular: Negative for chest pain and leg swelling.  Gastrointestinal: Negative for abdominal pain, blood in stool, diarrhea, nausea and vomiting.  Genitourinary: Negative for difficulty urinating, dysuria and frequency.  Musculoskeletal: Negative for back pain and neck pain.  Skin: Negative for rash.   Neurological: Negative for dizziness, light-headedness and headaches.  Hematological: Does not bruise/bleed easily.  Psychiatric/Behavioral: Negative for confusion.     Physical Exam Updated Vital Signs BP (!) 156/82 (BP Location: Right Arm)   Pulse 87   Temp 97.7 F (36.5 C) (Oral)   Resp 20   Ht 1.753 m (5\' 9" )   Wt 94.3 kg   SpO2 100%   BMI 30.72 kg/m   Physical Exam Vitals signs and nursing note reviewed.  Constitutional:      Appearance: Normal appearance. He is well-developed.  HENT:     Head: Normocephalic and atraumatic.  Eyes:     Conjunctiva/sclera: Conjunctivae normal.     Pupils: Pupils are equal, round, and reactive to light.  Neck:     Musculoskeletal: Normal range of motion and neck supple.  Cardiovascular:  Rate and Rhythm: Normal rate and regular rhythm.     Heart sounds: No murmur.  Pulmonary:     Effort: Pulmonary effort is normal. No respiratory distress.     Breath sounds: Normal breath sounds.  Abdominal:     Palpations: Abdomen is soft.     Tenderness: There is no abdominal tenderness.  Musculoskeletal: Normal range of motion.  Skin:    General: Skin is warm and dry.     Capillary Refill: Capillary refill takes less than 2 seconds.  Neurological:     General: No focal deficit present.     Mental Status: He is alert and oriented to person, place, and time.     Cranial Nerves: No cranial nerve deficit.     Sensory: No sensory deficit.     Motor: No weakness.      ED Treatments / Results  Labs (all labs ordered are listed, but only abnormal results are displayed) Labs Reviewed  CBC WITH DIFFERENTIAL/PLATELET - Abnormal; Notable for the following components:      Result Value   WBC 12.2 (*)    RBC 2.17 (*)    Hemoglobin 6.9 (*)    HCT 19.9 (*)    RDW 15.9 (*)    Platelets 32 (*)    Neutro Abs 8.9 (*)    Monocytes Absolute 1.1 (*)    Abs Immature Granulocytes 0.15 (*)    All other components within normal limits   COMPREHENSIVE METABOLIC PANEL - Abnormal; Notable for the following components:   Sodium 133 (*)    CO2 16 (*)    Glucose, Bld 113 (*)    BUN 82 (*)    Creatinine, Ser 9.86 (*)    Calcium 7.8 (*)    Total Protein 6.3 (*)    Albumin 2.7 (*)    AST 13 (*)    GFR calc non Af Amer 5 (*)    GFR calc Af Amer 5 (*)    All other components within normal limits  URINALYSIS, ROUTINE W REFLEX MICROSCOPIC - Abnormal; Notable for the following components:   Color, Urine STRAW (*)    Glucose, UA 50 (*)    Hgb urine dipstick MODERATE (*)    Protein, ur >=300 (*)    RBC / HPF >50 (*)    Bacteria, UA RARE (*)    All other components within normal limits  TYPE AND SCREEN    EKG None  Radiology Dg Chest 2 View  Result Date: 04/14/2019 CLINICAL DATA:  Anemia.  History of lung cancer. EXAM: CHEST - 2 VIEW COMPARISON:  Chest x-ray 01/24/2019 and chest CT 03/24/2019 FINDINGS: The cardiac silhouette, mediastinal and hilar contours are within normal limits and stable. There is mild tortuosity of the thoracic aorta. Mild chronic emphysematous changes with areas of pulmonary scarring. No infiltrates, edema or effusions. No worrisome pulmonary lesions. IMPRESSION: Chronic emphysematous changes and pulmonary scarring but no definite acute overlying pulmonary process or new pulmonary lesions. Electronically Signed   By: Marijo Sanes M.D.   On: 04/14/2019 13:20    Procedures Procedures (including critical care time)  CRITICAL CARE Performed by: Fredia Sorrow Total critical care time: 30 minutes Critical care time was exclusive of separately billable procedures and treating other patients. Critical care was necessary to treat or prevent imminent or life-threatening deterioration. Critical care was time spent personally by me on the following activities: development of treatment plan with patient and/or surrogate as well as nursing, discussions with consultants, evaluation of  patient's response to  treatment, examination of patient, obtaining history from patient or surrogate, ordering and performing treatments and interventions, ordering and review of laboratory studies, ordering and review of radiographic studies, pulse oximetry and re-evaluation of patient's condition.   Medications Ordered in ED Medications  sodium chloride 0.9 % bolus 1,000 mL (1,000 mLs Intravenous New Bag/Given 04/14/19 1255)     Initial Impression / Assessment and Plan / ED Course  I have reviewed the triage vital signs and the nursing notes.  Pertinent labs & imaging results that were available during my care of the patient were reviewed by me and considered in my medical decision making (see chart for details).        Patient stable but with significant lab abnormalities.  Has a hemoglobin less than 6 7.  And also patient has marketed renal function abnormalities.  Potassium is normal.  Renal function abnormalities consistent with acute kidney injury.  Patient will require admission.  Blood transfusion ordered for patient to receive 2 units of blood.  Discussed with hospitalist they will decide whether they will admit here or down to [facility] long so that he can continue his radiation treatment.  Nephrology not contacted.  Patient in no acute distress currently.  Final Clinical Impressions(s) / ED Diagnoses   Final diagnoses:  Anemia, unspecified type  AKI (acute kidney injury) [facility])    ED Discharge Orders    None       Fredia Sorrow, MD 04/14/19 1524

## 2019-04-14 NOTE — Telephone Encounter (Signed)
Renal Failure- Creatinine 9.0-Per Dr Julien Nordmann I instructed pt to got ED now .  Results faxed to Dr Willey Blade.

## 2019-04-14 NOTE — H&P (Addendum)
History and Physical  Jeremy Johnson Jeremy Johnson DOB: 07-Dec-1945 DOA: 04/14/2019  Referring physician: Rogene Houston  PCP: [name], MD   Chief Complaint: abnormal labs  HPI: Jeremy Johnson is a 73 y.o. male who is currently being treated by Dr. Earlie Server for small cell lung cancer was told to come to the emergency department with findings of an abnormal lab test.  The patient has been receiving radiation treatments.  He is also been having chemotherapy.  He was sent to the emergency department because he was noted to have anemia with a hemoglobin of 6.9 in addition to having markedly abnormal creatinine.  He does have a history of chronic kidney disease secondary to injury sustained when he had a AAA repair several years ago.  He says that he has been monitoring his creatinine along with his primary care physician since that time and it had been holding fairly stable up until recently.  He reports that since he started chemotherapy and he has been receiving Neulasta he reports that he has experienced several episodes where his urine became dark after a Neulasta treatment.  He says that he feels well.  He has been urinating.  He has no nausea or vomiting.  He has no chest pain or shortness of breath.  He has no sick contacts and he has tested negative for COVID-19.  He denies having blood in the urine.  He denies headaches.  He says that he has been getting fatigued with walking approximately 300 feet.  He says that he normally gets this way when he is anemic and seems to respond to a blood transfusion and seems to feel better after that.  ED course: The patient's labs were confirmed.  His creatinine is 9.86.  His CO2 is 16.  His sodium is 133.  His white blood cell count is 12.2 and his hemoglobin is 6.9.  His platelet count is 32.  The patient was typed and screened for packed red blood cells 2 units and he was started on IV fluids and admission was requested.  Review of Systems: All systems  reviewed and apart from history of presenting illness, are negative.  Past Medical History:  Diagnosis Date  . Anemia   . Blood transfusion without reported diagnosis   . CAD (coronary artery disease)    STENT... MID CIRCUMFLEX...1997  . Cancer ([place])   . Chronic kidney disease    STAGE 3  . Degenerative joint disease (DJD) of lumbar spine   . GERD (gastroesophageal reflux disease)   . Gout   . Hyperlipidemia   . Hypertension   . Hypothyroidism   . Incisional hernia   . Leukocytosis    CHRONIC MILD  . Myocardial infarction ([place])    1997   Past Surgical History:  Procedure Laterality Date  . ABDOMINAL AORTIC ANEURYSM REPAIR  2006  . BIOPSY  09/30/2018   Procedure: BIOPSY;  Surgeon: Danie Binder, MD;  [facility];  Service: Endoscopy;;  ascending colon  . COLONOSCOPY  2008  . COLONOSCOPY N/A 09/30/2018   Procedure: COLONOSCOPY;  Surgeon: Danie Binder, MD;  [facility];  Service: Endoscopy;  Laterality: N/A;  9:00  . CORONARY ANGIOPLASTY WITH STENT PLACEMENT  2008   MID CIRCUMFLEX  . POLYPECTOMY  09/30/2018   Procedure: POLYPECTOMY;  Surgeon: Danie Binder, MD;  [facility];  Service: Endoscopy;;  colon  . VIDEO BRONCHOSCOPY WITH ENDOBRONCHIAL NAVIGATION N/A 01/27/2019   Procedure: VIDEO BRONCHOSCOPY WITH  ENDOBRONCHIAL NAVIGATION;  Surgeon: Grace Isaac, MD;  Location: [place];  Service: Thoracic;  Laterality: N/A;  . VIDEO BRONCHOSCOPY WITH ENDOBRONCHIAL ULTRASOUND N/A 01/27/2019   Procedure: VIDEO BRONCHOSCOPY WITH ENDOBRONCHIAL ULTRASOUND;  Surgeon: Grace Isaac, MD;  Location: [place];  Service: Thoracic;  Laterality: N/A;   Social History:  reports that he quit smoking about 18 months ago. He has a 27.00 pack-year smoking history. He has never used smokeless tobacco. He reports current alcohol use. He reports that he does not use drugs.  Allergies  Allergen Reactions  . Penicillins Rash and Other (See Comments)    Has  patient had a PCN reaction causing immediate rash, facial/tongue/throat swelling, SOB or lightheadedness with hypotension: No Has patient had a PCN reaction causing severe rash involving mucus membranes or skin necrosis: No Has patient had a PCN reaction that required hospitalization: No Has patient had a PCN reaction occurring within the last 10 years: No If all of the above answers are "NO", then may proceed with Cephalosporin use.     Family History  Problem Relation Age of Onset  . Stroke Brother   . Lung cancer Sister 68       lung cancer/former    Prior to Admission medications   Medication Sig Start Date End Date Taking? Authorizing Provider  allopurinol (ZYLOPRIM) 300 MG tablet Take 300 mg by mouth daily.   Yes [provider]  amLODipine (NORVASC) 5 MG tablet Take 5 mg by mouth daily.  03/12/18  Yes [provider]  aspirin EC 81 MG tablet Take 81 mg by mouth daily.   Yes [provider]  calcitRIOL (ROCALTROL) 0.25 MCG capsule Take 0.25 mcg by mouth daily.  11/25/18  Yes [provider]  levothyroxine (SYNTHROID, LEVOTHROID) 175 MCG tablet Take 175 mcg by mouth daily before breakfast.   Yes [provider]  losartan-hydrochlorothiazide (HYZAAR) 100-25 MG tablet Take 1 tablet by mouth daily.   Yes [provider]  metoprolol tartrate (LOPRESSOR) 50 MG tablet Take 25 mg by mouth 2 (two) times daily.    Yes [provider]  mometasone (ELOCON) 0.1 % ointment Apply 1 application topically 2 (two) times daily as needed (for eczema).  07/11/18  Yes [provider]  omeprazole (PRILOSEC) 20 MG capsule Take 20 mg by mouth daily.   Yes [provider]  prochlorperazine (COMPAZINE) 10 MG tablet Take 1 tablet (10 mg total) by mouth every 6 (six) hours as needed for nausea or vomiting. 02/06/19  Yes Curt Bears, MD  simvastatin (ZOCOR) 20 MG tablet Take 20 mg by mouth daily.  03/12/18  Yes [provider]  sucralfate (CARAFATE) 1 g tablet Take 1 tablet (1 g total) by mouth 4 (four) times daily -  with meals and at bedtime. 5 min before meals for radiation induced esophagitis 04/11/19  Yes Tyler Pita, MD   Physical Exam: Vitals:   04/14/19 1400 04/14/19 1415 04/14/19 1430 04/14/19 1446  BP: 127/71  122/78 122/78  Pulse: 88 89 89 91  Resp: 20 (!) 24 18 16   Temp:    97.7 F (36.5 C)  TempSrc:    Oral  SpO2: 97% 96% 98%   Weight:      Height:         General exam: Chronically ill-appearing male he is in no distress.  He is alert and oriented x3.  Moderately built and nourished patient, lying comfortably supine on the gurney in no obvious distress.  Appears pale.  Head, eyes and ENT: Nontraumatic and normocephalic. Pupils equally reacting to light and accommodation. Oral mucosa moist.  Neck: Supple. No JVD, carotid bruit or thyromegaly.  Lymphatics: No lymphadenopathy.  Respiratory system: Clear to auscultation. No increased work of breathing.  Cardiovascular system: S1 and S2 heard, RRR. No JVD, murmurs, gallops, clicks or pedal edema.  Gastrointestinal system: Abdomen is nondistended, soft and nontender. Normal bowel sounds heard. No organomegaly or masses appreciated.  Central nervous system: Alert and oriented. No focal neurological deficits.  Extremities: Symmetric 5 x 5 power. Peripheral pulses symmetrically felt.   Skin: No rashes or acute findings.  Musculoskeletal system: Negative exam.  Psychiatry: Pleasant and cooperative.  Labs on Admission:  Basic Metabolic Panel: Recent Labs  Lab 04/08/19 0946 04/14/19 0959 04/14/19 1247  NA 135 134* 133*  K 5.6* 4.4 4.4  CL 108 106 103  CO2 16* 18* 16*  GLUCOSE 136* 152* 113*  BUN >85* 80* 82*  CREATININE 4.81* 9.52* 9.86*  CALCIUM 8.2* 7.7* 7.8*   Liver Function Tests: Recent Labs  Lab 04/08/19 0946 04/14/19 0959 04/14/19 1247  AST 9* 11* 13*  ALT 12 9 12   ALKPHOS 113 97 90  BILITOT 0.4 0.3 0.3   PROT 6.3* 5.9* 6.3*  ALBUMIN 2.6* 2.4* 2.7*   No results for input(s): LIPASE, AMYLASE in the last 168 hours. No results for input(s): AMMONIA in the last 168 hours. CBC: Recent Labs  Lab 04/08/19 0946 04/14/19 0959 04/14/19 1247  WBC 8.2 11.5* 12.2*  NEUTROABS 5.7 8.6* 8.9*  HGB 6.8* 6.8* 6.9*  HCT 21.0* 20.3* 19.9*  MCV 97.2 92.7 91.7  PLT 152 34* 32*   Cardiac Enzymes: No results for input(s): CKTOTAL, CKMB, CKMBINDEX, TROPONINI in the last 168 hours.  BNP (last 3 results) No results for input(s): PROBNP in the last 8760 hours. CBG: No results for input(s): GLUCAP in the last 168 hours.  Radiological Exams on Admission: Dg Chest 2 View  Result Date: 04/14/2019 CLINICAL DATA:  Anemia.  History of lung cancer. EXAM: CHEST - 2 VIEW COMPARISON:  Chest x-ray 01/24/2019 and chest CT 03/24/2019 FINDINGS: The cardiac silhouette, mediastinal and hilar contours are within normal limits and stable. There is mild tortuosity of the thoracic aorta. Mild chronic emphysematous changes with areas of pulmonary scarring. No infiltrates, edema or effusions. No worrisome pulmonary lesions. IMPRESSION: Chronic emphysematous changes and pulmonary scarring but no definite acute overlying pulmonary process or new pulmonary lesions. Electronically Signed   By: Marijo Sanes M.D.   On: 04/14/2019 13:20   Assessment/Plan Principal Problem:   Acute renal failure (ARF) (HCC) Active Problems:   Coronary atherosclerosis   Chronic kidney disease   GERD (gastroesophageal reflux disease)   CAD (coronary artery disease)   Hyperlipidemia   Hypertension   Gout   Hypothyroidism   Anemia in chronic kidney disease (CKD)   1. Acute kidney injury on stage 3 CKD - suspect his injury is related to chemotherapeutic agents.  I am going to consult inpatient nephrology.  He has been started on gentle fluid hydration.  Holding nephrotoxic agents.  Holding losartan.  Holding aspirin.  Check renal ultrasound.  Check  urine electrolytes.  Daily renal function panel testing. 2. Metabolic acidosis - added bicarbonate tabs.  3. Anemia and chronic kidney disease- the patient is being transfused 2 units packed red blood cells as ordered in the ED.  He will have daily CBC testing. 4. Essential hypertension-his blood pressures are stable we are holding  his blood pressure medications at this time. 5. Hypothyroidism-resume home levothyroxine. 6. CAD-temporarily holding aspirin. 7. Small cell lung cancer- will consult inpatient oncology service to see him while he is here.  We will temporarily have to hold his daily radiation treatments while he is recovering. 8. Thrombocytopenia- secondary to chemotherapy, holding heparin.  No signs of bleeding.  Follow daily CBC. 9. Gout-he takes daily allopurinol.  We have reduced the dose given his diminished renal function. 10. Hyperlipidemia- simvastatin dose reduced to 10 mg.   DVT Prophylaxis: SCDs Code Status: Full Family Communication: Patient updated at bedside Disposition Plan: Inpatient telemetry  Time spent: 57 minutes   Wynetta Emery, MD [facility] How to contact the [facility] Attending or Consulting provider [place] or covering provider during after hours [place], for this patient?  1. Check the care team in [facility] and look for a) attending/consulting [facility] provider listed and b) the [facility] team listed 2. Log into [email or web address] and use [place]'s universal password to access. If you do not have the password, please contact the hospital operator. 3. Locate the [facility] provider you are looking for under [facility] and page to a number that you can be directly reached. 4. If you still have difficulty reaching the provider, please page the  [facility] (Director on Call) for the Hospitalists listed on amion for assistance.

## 2019-04-14 NOTE — Telephone Encounter (Signed)
Received call report from [place].  "Today's Hgb = 6.8."  Secure chat message sent to collaborative with results.

## 2019-04-14 NOTE — ED Triage Notes (Addendum)
Patient sent here from [facility] after having blood drawn this morning. Per patient lab work indicated creatine at 9 and HGB at 6.8. HX of AAA with surgery.  Denies any blood in stools, pain, nausea, or vomiting. Patient reports recent anemia with first blood transfusion last week. Patient is a lung cancer patient and receives chemo. Last chemo a two weeks ago. Patient denies any urinary symptoms. Patient does report some shortness of breath without cough or fever.

## 2019-04-14 NOTE — ED Notes (Signed)
Date and time results received: 04/14/19 1:00 PM  (use smartphrase ".now" to insert current time)  Test: Hgb Critical Value: 6.9  Name of Provider Notified: [place]  Orders Received? Or Actions Taken?: Orders Received - See Orders for details

## 2019-04-15 ENCOUNTER — Inpatient Hospital Stay (HOSPITAL_COMMUNITY): Payer: Medicare Other

## 2019-04-15 ENCOUNTER — Ambulatory Visit: Payer: Medicare Other

## 2019-04-15 DIAGNOSIS — Z923 Personal history of irradiation: Secondary | ICD-10-CM

## 2019-04-15 DIAGNOSIS — Z801 Family history of malignant neoplasm of trachea, bronchus and lung: Secondary | ICD-10-CM

## 2019-04-15 DIAGNOSIS — Z9221 Personal history of antineoplastic chemotherapy: Secondary | ICD-10-CM

## 2019-04-15 DIAGNOSIS — I10 Essential (primary) hypertension: Secondary | ICD-10-CM

## 2019-04-15 DIAGNOSIS — N183 Chronic kidney disease, stage 3 (moderate): Secondary | ICD-10-CM

## 2019-04-15 DIAGNOSIS — E02 Subclinical iodine-deficiency hypothyroidism: Secondary | ICD-10-CM

## 2019-04-15 DIAGNOSIS — I252 Old myocardial infarction: Secondary | ICD-10-CM

## 2019-04-15 DIAGNOSIS — I129 Hypertensive chronic kidney disease with stage 1 through stage 4 chronic kidney disease, or unspecified chronic kidney disease: Secondary | ICD-10-CM

## 2019-04-15 DIAGNOSIS — C3411 Malignant neoplasm of upper lobe, right bronchus or lung: Secondary | ICD-10-CM

## 2019-04-15 DIAGNOSIS — D696 Thrombocytopenia, unspecified: Secondary | ICD-10-CM

## 2019-04-15 DIAGNOSIS — N179 Acute kidney failure, unspecified: Secondary | ICD-10-CM

## 2019-04-15 DIAGNOSIS — M47816 Spondylosis without myelopathy or radiculopathy, lumbar region: Secondary | ICD-10-CM

## 2019-04-15 DIAGNOSIS — I2510 Atherosclerotic heart disease of native coronary artery without angina pectoris: Secondary | ICD-10-CM

## 2019-04-15 DIAGNOSIS — D649 Anemia, unspecified: Secondary | ICD-10-CM

## 2019-04-15 DIAGNOSIS — Z87891 Personal history of nicotine dependence: Secondary | ICD-10-CM

## 2019-04-15 DIAGNOSIS — Z880 Allergy status to penicillin: Secondary | ICD-10-CM

## 2019-04-15 DIAGNOSIS — Z955 Presence of coronary angioplasty implant and graft: Secondary | ICD-10-CM

## 2019-04-15 LAB — TYPE AND SCREEN
ABO/RH(D): O POS
Antibody Screen: NEGATIVE
Unit division: 0
Unit division: 0

## 2019-04-15 LAB — C-REACTIVE PROTEIN: CRP: 5 mg/dL — ABNORMAL HIGH (ref <1.0)

## 2019-04-15 LAB — URINALYSIS, ROUTINE W REFLEX MICROSCOPIC
Bacteria, UA: NONE SEEN
Bilirubin Urine: NEGATIVE
Glucose, UA: 50 mg/dL — AB
Ketones, ur: NEGATIVE mg/dL
Leukocytes,Ua: NEGATIVE
Nitrite: NEGATIVE
Protein, ur: >=300 mg/dL — AB
RBC / HPF: >50 RBC/hpf — ABNORMAL HIGH (ref 0–5)
Specific Gravity, Urine: 1.01 (ref 1.005–1.030)
pH: 7 (ref 5.0–8.0)

## 2019-04-15 LAB — CBC
HCT: 26.1 % — ABNORMAL LOW (ref 39.0–52.0)
Hemoglobin: 8.9 g/dL — ABNORMAL LOW (ref 13.0–17.0)
MCH: 30.3 pg (ref 26.0–34.0)
MCHC: 34.1 g/dL (ref 30.0–36.0)
MCV: 88.8 fL (ref 80.0–100.0)
Platelets: 38 10*3/uL — ABNORMAL LOW (ref 150–400)
RBC: 2.94 MIL/uL — ABNORMAL LOW (ref 4.22–5.81)
RDW: 15.4 % (ref 11.5–15.5)
WBC: 11.2 10*3/uL — ABNORMAL HIGH (ref 4.0–10.5)
nRBC: 0 % (ref 0.0–0.2)

## 2019-04-15 LAB — RENAL FUNCTION PANEL
Albumin: 2.3 g/dL — ABNORMAL LOW (ref 3.5–5.0)
Anion gap: 13 (ref 5–15)
BUN: 82 mg/dL — ABNORMAL HIGH (ref 8–23)
CO2: 14 mmol/L — ABNORMAL LOW (ref 22–32)
Calcium: 7.4 mg/dL — ABNORMAL LOW (ref 8.9–10.3)
Chloride: 109 mmol/L (ref 98–111)
Creatinine, Ser: 10.18 mg/dL — ABNORMAL HIGH (ref 0.61–1.24)
GFR calc Af Amer: 5 mL/min — ABNORMAL LOW (ref >60)
GFR calc non Af Amer: 5 mL/min — ABNORMAL LOW (ref >60)
Glucose, Bld: 103 mg/dL — ABNORMAL HIGH (ref 70–99)
Phosphorus: 6.4 mg/dL — ABNORMAL HIGH (ref 2.5–4.6)
Potassium: 4.9 mmol/L (ref 3.5–5.1)
Sodium: 136 mmol/L (ref 135–145)

## 2019-04-15 LAB — PROTEIN / CREATININE RATIO, URINE
Creatinine, Urine: 50.58 mg/dL
Total Protein, Urine: 737 mg/dL

## 2019-04-15 LAB — SODIUM, URINE, RANDOM: Sodium, Ur: 72 mmol/L

## 2019-04-15 LAB — BPAM RBC
Blood Product Expiration Date: 202006272359
Blood Product Expiration Date: 202006272359
ISSUE DATE / TIME: 202006011439
ISSUE DATE / TIME: 202006011938
Unit Type and Rh: 5100
Unit Type and Rh: 5100

## 2019-04-15 LAB — SEDIMENTATION RATE: Sed Rate: 107 mm/hr — ABNORMAL HIGH (ref 0–16)

## 2019-04-15 LAB — HEMOGLOBIN AND HEMATOCRIT, BLOOD
HCT: 23.2 % — ABNORMAL LOW (ref 39.0–52.0)
Hemoglobin: 7.7 g/dL — ABNORMAL LOW (ref 13.0–17.0)

## 2019-04-15 LAB — OSMOLALITY, URINE: Osmolality, Ur: 289 mOsm/kg — ABNORMAL LOW (ref 300–900)

## 2019-04-15 LAB — OSMOLALITY: Osmolality: 306 mOsm/kg — ABNORMAL HIGH (ref 275–295)

## 2019-04-15 LAB — MAGNESIUM: Magnesium: 1.4 mg/dL — ABNORMAL LOW (ref 1.7–2.4)

## 2019-04-15 MED ORDER — SODIUM CHLORIDE 0.45 % IV SOLN
INTRAVENOUS | Status: DC
  Administered 2019-04-15: 14:00:00 via INTRAVENOUS
  Filled 2019-04-15 (×6): qty 100

## 2019-04-15 MED ORDER — MAGNESIUM SULFATE 2 GM/50ML IV SOLN
2.0000 g | Freq: Once | INTRAVENOUS | Status: AC
  Administered 2019-04-15: 2 g via INTRAVENOUS
  Filled 2019-04-15: qty 50

## 2019-04-15 NOTE — Progress Notes (Signed)
Radiation cancelled for today. Jeremy Johnson is an inpatient at [facility] with a low hemoglobin and poor kidney function. L2 instructed to cancel radiation therapy for today.

## 2019-04-15 NOTE — Consult Note (Signed)
[facility] Consultation Oncology  Name: Jeremy Johnson      MRN: [id number]    Location: A306/A306-01  Date: 04/15/2019 Time:5:36 PM   REFERRING PHYSICIAN: Dr. Wynetta Emery  REASON FOR CONSULT: Limited stage small cell lung cancer.   DIAGNOSIS: Acute kidney injury.  HISTORY OF PRESENT ILLNESS: Jeremy Johnson is a 73 year old very pleasant white male who is seen in consultation at the request of Dr. Wynetta Emery.  He follows up with Dr. Julien Nordmann in [place].  He was diagnosed with limited stage (T2b N1 M0) small cell lung cancer with right upper lobe invasion diagnosed in March 2020.  He was started on chemotherapy with carboplatin and etoposide.  After 2 cycles of chemotherapy was completed, CT scan of the chest showed complete resolution of the mass anterior to the descending thoracic aorta.  He did receive cycle 3 on 03/31/2019 and also started radiation therapy on 03/17/2019.  He received radiation therapy yesterday and on the way back home, he received a phone call to go to the nearest emergency room based on his labs.  He reported that he tolerated last cycle of chemotherapy reasonably well.  Denied any major issues with vomiting or diarrhea.  He felt mildly tired.  Denies any fevers or infections.  His creatinine was found to be elevated at 9.52.  His baseline creatinine is around 2.5.  His hemoglobin was also 6.8 with platelet count of 34.  He did receive 2 units of blood transfusion.  He is feeling better now.  Hemoglobin improved to 8.9.  PAST MEDICAL HISTORY:   Past Medical History:  Diagnosis Date  . Anemia   . Blood transfusion without reported diagnosis   . CAD (coronary artery disease)    STENT... MID CIRCUMFLEX...1997  . Cancer ([place])   . Chronic kidney disease    STAGE 3  . Degenerative joint disease (DJD) of lumbar spine   . GERD (gastroesophageal reflux disease)   . Gout   . Hyperlipidemia   . Hypertension   . Hypothyroidism   . Incisional hernia   . Leukocytosis    CHRONIC  MILD  . Myocardial infarction ([place])    1997    ALLERGIES: Allergies  Allergen Reactions  . Penicillins Rash and Other (See Comments)    Has patient had a PCN reaction causing immediate rash, facial/tongue/throat swelling, SOB or lightheadedness with hypotension: No Has patient had a PCN reaction causing severe rash involving mucus membranes or skin necrosis: No Has patient had a PCN reaction that required hospitalization: No Has patient had a PCN reaction occurring within the last 10 years: No If all of the above answers are "NO", then may proceed with Cephalosporin use.       MEDICATIONS: I have reviewed the patient's current medications.     PAST SURGICAL HISTORY Past Surgical History:  Procedure Laterality Date  . ABDOMINAL AORTIC ANEURYSM REPAIR  2006  . BIOPSY  09/30/2018   Procedure: BIOPSY;  Surgeon: Danie Binder, MD;  [facility];  Service: Endoscopy;;  ascending colon  . COLONOSCOPY  2008  . COLONOSCOPY N/A 09/30/2018   Procedure: COLONOSCOPY;  Surgeon: Danie Binder, MD;  [facility];  Service: Endoscopy;  Laterality: N/A;  9:00  . CORONARY ANGIOPLASTY WITH STENT PLACEMENT  2008   MID CIRCUMFLEX  . POLYPECTOMY  09/30/2018   Procedure: POLYPECTOMY;  Surgeon: Danie Binder, MD;  [facility];  Service: Endoscopy;;  colon  . VIDEO BRONCHOSCOPY WITH ENDOBRONCHIAL NAVIGATION N/A  01/27/2019   Procedure: VIDEO BRONCHOSCOPY WITH ENDOBRONCHIAL NAVIGATION;  Surgeon: Grace Isaac, MD;  Location[facility] OR;  Service: Thoracic;  Laterality: N/A;  . VIDEO BRONCHOSCOPY WITH ENDOBRONCHIAL ULTRASOUND N/A 01/27/2019   Procedure: VIDEO BRONCHOSCOPY WITH ENDOBRONCHIAL ULTRASOUND;  Surgeon: Grace Isaac, MD;  Location: [facility] OR;  Service: Thoracic;  Laterality: N/A;    FAMILY HISTORY: Family History  Problem Relation Age of Onset  . Stroke Brother   . Lung cancer Sister 49       lung cancer/former    SOCIAL HISTORY:  reports that he  quit smoking about 18 months ago. He has a 27.00 pack-year smoking history. He has never used smokeless tobacco. He reports current alcohol use. He reports that he does not use drugs.  PERFORMANCE STATUS: The patient's performance status is 1 - Symptomatic but completely ambulatory  PHYSICAL EXAM: Most Recent Vital Signs: Blood pressure 128/68, pulse 81, temperature 97.8 F (36.6 C), temperature source Oral, resp. rate 16, height 5\' 9"  (1.753 m), weight 209 lb 14.1 oz (95.2 kg), SpO2 97 %. BP 128/68 (BP Location: Left Arm)   Pulse 81   Temp 97.8 F (36.6 C) (Oral)   Resp 16   Ht 5\' 9"  (1.753 m)   Wt 209 lb 14.1 oz (95.2 kg)   SpO2 97%   BMI 30.99 kg/m  General appearance: alert, cooperative and appears stated age Neck: no adenopathy, supple, symmetrical, trachea midline and thyroid not enlarged, symmetric, no tenderness/mass/nodules Lungs: clear to auscultation bilaterally Heart: regular rate and rhythm Abdomen: soft, non-tender; bowel sounds normal; no masses,  no organomegaly and Midline abdominal hernia present. Extremities: extremities normal, atraumatic, no cyanosis or edema Skin: Skin color, texture, turgor normal. No rashes or lesions Lymph nodes: Cervical, supraclavicular, and axillary nodes normal. Neurologic: Grossly normal  LABORATORY DATA:  Results for orders placed or performed during the hospital encounter of 04/14/19 (from the past 48 hour(s))  Type and screen [facility]     Status: None   Collection Time: 04/14/19 12:45 PM  Result Value Ref Range   ABO/RH(D) O POS    Antibody Screen NEG    Sample Expiration 04/17/2019,2359    Unit Number [id number]    Blood Component Type RED CELLS,LR    Unit division 00    Status of Unit ISSUED,FINAL    Transfusion Status OK TO TRANSFUSE    Crossmatch Result Compatible    Unit Number [id number]    Blood Component Type RED CELLS,LR    Unit division 00    Status of Unit ISSUED,FINAL    Transfusion Status OK  TO TRANSFUSE    Crossmatch Result      Compatible Performed at [facility] : [facility], [address] [place], [place] [address]   Prepare RBC     Status: None   Collection Time: 04/14/19 12:45 PM  Result Value Ref Range   Order Confirmation      ORDER PROCESSED BY BLOOD BANK Performed at [facility], [address] [place], [place] [address]   ABO/Rh     Status: None   Collection Time: 04/14/19 12:45 PM  Result Value Ref Range   ABO/RH(D)      O POS Performed at [facility], [address] [place], [place] [address]   CBC with Differential     Status: Abnormal   Collection Time: 04/14/19 12:47 PM  Result Value Ref Range   WBC 12.2 (H) 4.0 - 10.5 K/uL   RBC 2.17 (L) 4.22 - 5.81  MIL/uL   Hemoglobin 6.9 (LL) 13.0 - 17.0 g/dL    Comment: REPEATED TO VERIFY THIS CRITICAL RESULT HAS VERIFIED AND BEEN CALLED TO MANDY CREWS BY HILLARY FLYNT ON 06 01 2020 AT 1257, AND HAS BEEN READ BACK.     HCT 19.9 (L) 39.0 - 52.0 %   MCV 91.7 80.0 - 100.0 fL   MCH 31.8 26.0 - 34.0 pg   MCHC 34.7 30.0 - 36.0 g/dL   RDW 15.9 (H) 11.5 - 15.5 %   Platelets 32 (L) 150 - 400 K/uL    Comment: PLATELET COUNT CONFIRMED BY SMEAR SPECIMEN CHECKED FOR CLOTS Immature Platelet Fraction may be clinically indicated, consider ordering this additional test [id number]    nRBC 0.0 0.0 - 0.2 %   Neutrophils Relative % 74 %   Neutro Abs 8.9 (H) 1.7 - 7.7 K/uL   Lymphocytes Relative 14 %   Lymphs Abs 1.7 0.7 - 4.0 K/uL   Monocytes Relative 9 %   Monocytes Absolute 1.1 (H) 0.1 - 1.0 K/uL   Eosinophils Relative 2 %   Eosinophils Absolute 0.3 0.0 - 0.5 K/uL   Basophils Relative 0 %   Basophils Absolute 0.0 0.0 - 0.1 K/uL   Immature Granulocytes 1 %   Abs Immature Granulocytes 0.15 (H) 0.00 - 0.07 K/uL    Comment: Performed at [facility], [address] [place], [place] [address]  Comprehensive metabolic panel     Status: Abnormal   Collection Time: 04/14/19 12:47 PM  Result Value Ref Range   Sodium 133 (L) 135 -  145 mmol/L   Potassium 4.4 3.5 - 5.1 mmol/L   Chloride 103 98 - 111 mmol/L   CO2 16 (L) 22 - 32 mmol/L   Glucose, Bld 113 (H) 70 - 99 mg/dL   BUN 82 (H) 8 - 23 mg/dL   Creatinine, Ser 9.86 (H) 0.61 - 1.24 mg/dL   Calcium 7.8 (L) 8.9 - 10.3 mg/dL   Total Protein 6.3 (L) 6.5 - 8.1 g/dL   Albumin 2.7 (L) 3.5 - 5.0 g/dL   AST 13 (L) 15 - 41 U/L   ALT 12 0 - 44 U/L   Alkaline Phosphatase 90 38 - 126 U/L   Total Bilirubin 0.3 0.3 - 1.2 mg/dL   GFR calc non Af Amer 5 (L) >60 mL/min   GFR calc Af Amer 5 (L) >60 mL/min   Anion gap 14 5 - 15    Comment: Performed at [facility], [address] [place], [place] [address]  Urinalysis, Routine w reflex microscopic     Status: Abnormal   Collection Time: 04/14/19 12:47 PM  Result Value Ref Range   Color, Urine STRAW (A) YELLOW   APPearance CLEAR CLEAR   Specific Gravity, Urine 1.008 1.005 - 1.030   pH 7.0 5.0 - 8.0   Glucose, UA 50 (A) NEGATIVE mg/dL   Hgb urine dipstick MODERATE (A) NEGATIVE   Bilirubin Urine NEGATIVE NEGATIVE   Ketones, ur NEGATIVE NEGATIVE mg/dL   Protein, ur >=300 (A) NEGATIVE mg/dL   Nitrite NEGATIVE NEGATIVE   Leukocytes,Ua NEGATIVE NEGATIVE   RBC / HPF >50 (H) 0 - 5 RBC/hpf   WBC, UA 0-5 0 - 5 WBC/hpf   Bacteria, UA RARE (A) NONE SEEN    Comment: Performed at [facility], [address] [place], [place] [address]  C-reactive protein     Status: Abnormal   Collection Time: 04/14/19 12:47 PM  Result Value Ref Range   CRP 5.0 (  H) <1.0 mg/dL    Comment: Performed at [facility], [address], [place], [place] [address]  SARS Coronavirus 2 ([place] - Performed in [facility] hospital lab), Hosp Order     Status: None   Collection Time: 04/14/19  2:13 PM  Result Value Ref Range   SARS Coronavirus 2 NEGATIVE NEGATIVE    Comment: (NOTE) If result is NEGATIVE SARS-CoV-2 target nucleic acids are NOT DETECTED. The SARS-CoV-2 RNA is generally detectable in upper and lower  respiratory specimens during the acute  phase of infection. The lowest  concentration of SARS-CoV-2 viral copies this assay can detect is 250  copies / mL. A negative result does not preclude SARS-CoV-2 infection  and should not be used as the sole basis for treatment or other  patient management decisions.  A negative result may occur with  improper specimen collection / handling, submission of specimen other  than nasopharyngeal swab, presence of viral mutation(s) within the  areas targeted by this assay, and inadequate number of viral copies  (<250 copies / mL). A negative result must be combined with clinical  observations, patient history, and epidemiological information. If result is POSITIVE SARS-CoV-2 target nucleic acids are DETECTED. The SARS-CoV-2 RNA is generally detectable in upper and lower  respiratory specimens dur ing the acute phase of infection.  Positive  results are indicative of active infection with SARS-CoV-2.  Clinical  correlation with patient history and other diagnostic information is  necessary to determine patient infection status.  Positive results do  not rule out bacterial infection or co-infection with other viruses. If result is PRESUMPTIVE POSTIVE SARS-CoV-2 nucleic acids MAY BE PRESENT.   A presumptive positive result was obtained on the submitted specimen  and confirmed on repeat testing.  While 2019 novel coronavirus  (SARS-CoV-2) nucleic acids may be present in the submitted sample  additional confirmatory testing may be necessary for epidemiological  and / or clinical management purposes  to differentiate between  SARS-CoV-2 and other Sarbecovirus currently known to infect humans.  If clinically indicated additional testing with an alternate test  methodology [phone]) is advised. The SARS-CoV-2 RNA is generally  detectable in upper and lower respiratory sp ecimens during the acute  phase of infection. The expected result is Negative. Fact Sheet for Patients:   StrictlyIdeas.no Fact Sheet for Healthcare Providers: BankingDealers.co.za This test is not yet approved or cleared by the [place] FDA and has been authorized for detection and/or diagnosis of SARS-CoV-2 by FDA under an Emergency Use Authorization (EUA).  This EUA will remain in effect (meaning this test can be used) for the duration of the COVID-19 declaration under Section 564(b)(1) of the Act, 21 U.S.C. section 360bbb-3(b)(1), unless the authorization is terminated or revoked sooner. Performed at [facility], [address], [place], [place] [address]   Osmolality, urine     Status: Abnormal   Collection Time: 04/14/19  4:19 PM  Result Value Ref Range   Osmolality, Ur 289 (L) 300 - 900 mOsm/kg    Comment: Performed at [place] [address] [place], [place] [address]  Na and K (sodium & potassium), [place] urine     Status: None   Collection Time: 04/14/19  4:19 PM  Result Value Ref Range   Sodium, Ur 69 mmol/L   Potassium Urine 27 mmol/L    Comment: Performed at [facility], [address], [place], [place] [address]  Chloride, urine, random     Status: None   Collection Time:  04/14/19  4:19 PM  Result Value Ref Range   Chloride Urine 71 mmol/L    Comment: Performed at [facility], [address] [place], [place] [address]  Osmolality     Status: Abnormal   Collection Time: 04/15/19 12:42 AM  Result Value Ref Range   Osmolality 306 (H) 275 - 295 mOsm/kg    Comment: Performed at [facility], [place] [address] [place], [place] [address]  Sedimentation rate     Status: Abnormal   Collection Time: 04/15/19 12:42 AM  Result Value Ref Range   Sed Rate 107 (H) 0 - 16 mm/hr    Comment: Performed at [facility], [address], [place], [place] [address]  Hemoglobin and hematocrit, blood     Status: Abnormal   Collection Time: 04/15/19 12:44 AM  Result Value Ref Range   Hemoglobin 7.7 (L) 13.0 - 17.0 g/dL   HCT  23.2 (L) 39.0 - 52.0 %    Comment: Performed at [facility], [address] [place], [place] [address]  Renal function panel     Status: Abnormal   Collection Time: 04/15/19 12:55 AM  Result Value Ref Range   Sodium 136 135 - 145 mmol/L   Potassium 4.9 3.5 - 5.1 mmol/L   Chloride 109 98 - 111 mmol/L   CO2 14 (L) 22 - 32 mmol/L   Glucose, Bld 103 (H) 70 - 99 mg/dL   BUN 82 (H) 8 - 23 mg/dL   Creatinine, Ser 10.18 (H) 0.61 - 1.24 mg/dL   Calcium 7.4 (L) 8.9 - 10.3 mg/dL   Phosphorus 6.4 (H) 2.5 - 4.6 mg/dL   Albumin 2.3 (L) 3.5 - 5.0 g/dL   GFR calc non Af Amer 5 (L) >60 mL/min   GFR calc Af Amer 5 (L) >60 mL/min   Anion gap 13 5 - 15    Comment: Performed at [facility], [address], [place], [place] [address]  Magnesium     Status: Abnormal   Collection Time: 04/15/19 12:55 AM  Result Value Ref Range   Magnesium 1.4 (L) 1.7 - 2.4 mg/dL    Comment: Performed at [facility], [address] [place], [place] [address]  CBC     Status: Abnormal   Collection Time: 04/15/19  8:54 AM  Result Value Ref Range   WBC 11.2 (H) 4.0 - 10.5 K/uL   RBC 2.94 (L) 4.22 - 5.81 MIL/uL   Hemoglobin 8.9 (L) 13.0 - 17.0 g/dL   HCT 26.1 (L) 39.0 - 52.0 %   MCV 88.8 80.0 - 100.0 fL   MCH 30.3 26.0 - 34.0 pg   MCHC 34.1 30.0 - 36.0 g/dL   RDW 15.4 11.5 - 15.5 %   Platelets 38 (L) 150 - 400 K/uL    Comment: PLATELET COUNT CONFIRMED BY SMEAR SPECIMEN CHECKED FOR CLOTS Immature Platelet Fraction may be clinically indicated, consider ordering this additional test [id number]    nRBC 0.0 0.0 - 0.2 %    Comment: Performed at [facility], [address] [place], [place] [address]  Urinalysis, Routine w reflex microscopic     Status: Abnormal   Collection Time: 04/15/19  9:13 AM  Result Value Ref Range   Color, Urine STRAW (A) YELLOW   APPearance CLEAR CLEAR   Specific Gravity, Urine 1.010 1.005 - 1.030   pH 7.0 5.0 - 8.0   Glucose, UA 50 (A) NEGATIVE mg/dL   Hgb urine dipstick MODERATE (A)  NEGATIVE   Bilirubin Urine  NEGATIVE NEGATIVE   Ketones, ur NEGATIVE NEGATIVE mg/dL   Protein, ur >=300 (A) NEGATIVE mg/dL   Nitrite NEGATIVE NEGATIVE   Leukocytes,Ua NEGATIVE NEGATIVE   RBC / HPF >50 (H) 0 - 5 RBC/hpf   WBC, UA 0-5 0 - 5 WBC/hpf   Bacteria, UA NONE SEEN NONE SEEN   Squamous Epithelial / LPF 0-5 0 - 5   Mucus PRESENT    Hyaline Casts, UA PRESENT    Uric Acid Crys, UA PRESENT     Comment: Performed at [facility], [address] [place], [place] [address]  Sodium, urine, random     Status: None   Collection Time: 04/15/19  9:13 AM  Result Value Ref Range   Sodium, Ur 72 mmol/L    Comment: Performed at [facility], [address] [place], [place] [address]  Protein / creatinine ratio, urine     Status: None   Collection Time: 04/15/19  9:16 AM  Result Value Ref Range   Creatinine, Urine 50.58 mg/dL   Total Protein, Urine 737 mg/dL    Comment: RESULTS CONFIRMED BY MANUAL DILUTION   Protein Creatinine Ratio        0.00 - 0.15 mg/mg[Cre]    Comment: RESULT BELOW REPORTABLE RANGE, UNABLE TO CALCULATE. Performed at [facility], [address] [place], [place] [address]       RADIOGRAPHY: Dg Chest 2 View  Result Date: 04/14/2019 CLINICAL DATA:  Anemia.  History of lung cancer. EXAM: CHEST - 2 VIEW COMPARISON:  Chest x-ray 01/24/2019 and chest CT 03/24/2019 FINDINGS: The cardiac silhouette, mediastinal and hilar contours are within normal limits and stable. There is mild tortuosity of the thoracic aorta. Mild chronic emphysematous changes with areas of pulmonary scarring. No infiltrates, edema or effusions. No worrisome pulmonary lesions. IMPRESSION: Chronic emphysematous changes and pulmonary scarring but no definite acute overlying pulmonary process or new pulmonary lesions. Electronically Signed   By: Marijo Sanes M.D.   On: 04/14/2019 13:20   US Renal  Result Date: 04/15/2019 CLINICAL DATA:  Acute renal disease.  Chronic renal disease. EXAM: RENAL / URINARY TRACT  ULTRASOUND COMPLETE COMPARISON:  PET-CT 01/22/2019. FINDINGS: Right Kidney: Renal measurements: 12.8 x 6.0 x 6.1 cm = volume: 242.3 mL. Increased echogenicity. 8.8 cm simple cyst. 3.6 cm simple cyst. No hydronephrosis visualized. Left Kidney: Renal measurements: 12.9 x 6.1 x 5.3 cm = volume: 219.5 mL. Increased echogenicity. No mass or hydronephrosis visualized. Bladder: Appears normal for degree of bladder distention. IMPRESSION: Increased echogenicity both kidneys. This consistent chronic medical renal disease. Two simple cyst right kidney. No evidence of hydronephrosis or bladder distention. No acute abnormality identified. Electronically Signed   By: Marcello Moores  Register   On: 04/15/2019 11:33       PATHOLOGY: Small cell lung cancer.  ASSESSMENT and PLAN:  1.  Limited stage small cell lung cancer: - Diagnosed in March 2020, started on cycle 1 chemotherapy with carboplatin and etoposide on 02/17/2019, delivered under the direction of Dr. Julien Nordmann. -Radiation was started on 03/17/2019 with cycle 3 of chemotherapy on 03/31/2019. - CT scan after 2 cycles showed near complete resolution of the mass anterior to the descending thoracic aorta. -Thrombocytopenia with platelet count of 38, most likely from myelosuppression from chemotherapy.  This will improve in the next few days. - Anemia improved with transfusion.  Hemoglobin today is 8.9.  Transfuse as needed. -We will follow the patient in the hospital and notify Dr. Julien Nordmann of his admission. -Radiation therapy will be on hold  until his discharge.  2.  Acute kidney injury: -He has CKD with a baseline creatinine around 2.2-2.5. - His creatinine on admission was 9.6, was 10.18 today. - He is nonoliguric and has no uremic symptoms. - He is being followed by nephrology Dr. Carolin Sicks.  All questions were answered. The patient knows to call the clinic with any problems, questions or concerns. We can certainly see the patient much sooner if  necessary.    Derek Jack

## 2019-04-15 NOTE — Progress Notes (Signed)
Patient has order for Bladder scan. Patient bladder scanned with 0 cc urine retention. Patient has a total of 1050 cc urine output today with several unmeasured urine occurrences. Patient denies any bladder fullness or discomfort at this time or difficulty voiding. Will continue to monitor patient.

## 2019-04-15 NOTE — Progress Notes (Addendum)
PROGRESS NOTE    Jeremy Johnson  IRW:[id number]  DOB: 10/05/1946  DOA: 04/14/2019 PCP: [name], MD   Brief Admission Hx: 73 year old gentleman currently being treated for small cell lung cancer by Dr. Earlie Server presented to the emergency department after he was noted to have an abnormal lab test.  He is currently receiving radiation treatments and chemotherapy.  He was sent to the emergency department with a hemoglobin of 6.9 and an acute kidney injury with a creatinine bump up to 9.3.  He had no uremic symptoms and was urinating on presentation.   MDM/Assessment & Plan:   1. AKI on CKD stage 3 - Pt's creatinine continue to rise, he has been seen by inpatient nephrology team.  It is felt that this could be from combination of carboplatin in association with losartan/HCT and possibly from Neulasta. He is having a repeat urinalysis, bladder scan, urine lytes pending, urine protein/creatine ratio pending.  2. Metabolic acidosis - Nephrology started on bicarbonate infusion.  3. Hypomagnesemia - this is being replaced, follow.  4. Gout - hold allopurinol for now.  5. Essential hypertension - continue metoprolol.  6. Anemia due to renal disease and neoplastic disease - He is s/p 2 unit PRBC.  Follow.  7. Thrombocytopenia - secondary to chemotherapy.  Follow.  No active bleeding.  8. SCLC - temporarily hold daily radiation treatments.    DVT prophylaxis: SCDs Code Status: Full  Family Communication:  Disposition Plan: continue inpatient care    Consultants:   nephrology  Procedures:    Antimicrobials:     Subjective: Pt says he has no nausea or vomiting, no chest pain and no SOB.  He is urinating.    Objective: Vitals:   04/14/19 2258 04/15/19 0525 04/15/19 0814 04/15/19 0916  BP: 140/74 122/66  135/81  Pulse: 95 85  97  Resp: 16 20    Temp: 98.4 F (36.9 C) 98.7 F (37.1 C)    TempSrc: Oral Oral    SpO2: 97% 96% 96%   Weight: 95.2 kg     Height:         Intake/Output Summary (Last 24 hours) at 04/15/2019 1148 Last data filed at 04/15/2019 0900 Gross per 24 hour  Intake 2374.11 ml  Output 650 ml  Net 1724.11 ml   Filed Weights   04/14/19 1204 04/14/19 2258  Weight: 94.3 kg 95.2 kg    REVIEW OF SYSTEMS  As per history otherwise all reviewed and reported negative  Exam:  General exam: awake, alert, NAD. Cooperative.  Respiratory system: Clear. No increased work of breathing. Cardiovascular system: S1 & S2 heard. No JVD, murmurs, gallops, clicks or pedal edema. Gastrointestinal system: Abdomen is nondistended, soft and nontender. Normal bowel sounds heard. Central nervous system: Alert and oriented. No focal neurological deficits. Extremities: trace pretibial edema BLEs.  Data Reviewed: Basic Metabolic Panel: Recent Labs  Lab 04/14/19 0959 04/14/19 1247 04/15/19 0055  NA 134* 133* 136  K 4.4 4.4 4.9  CL 106 103 109  CO2 18* 16* 14*  GLUCOSE 152* 113* 103*  BUN 80* 82* 82*  CREATININE 9.52* 9.86* 10.18*  CALCIUM 7.7* 7.8* 7.4*  MG  --   --  1.4*  PHOS  --   --  6.4*   Liver Function Tests: Recent Labs  Lab 04/14/19 0959 04/14/19 1247 04/15/19 0055  AST 11* 13*  --   ALT 9 12  --   ALKPHOS 97 90  --   BILITOT 0.3 0.3  --  PROT 5.9* 6.3*  --   ALBUMIN 2.4* 2.7* 2.3*   No results for input(s): LIPASE, AMYLASE in the last 168 hours. No results for input(s): AMMONIA in the last 168 hours. CBC: Recent Labs  Lab 04/14/19 0959 04/14/19 1247 04/15/19 0044 04/15/19 0854  WBC 11.5* 12.2*  --  11.2*  NEUTROABS 8.6* 8.9*  --   --   HGB 6.8* 6.9* 7.7* 8.9*  HCT 20.3* 19.9* 23.2* 26.1*  MCV 92.7 91.7  --  88.8  PLT 34* 32*  --  38*   Cardiac Enzymes: No results for input(s): CKTOTAL, CKMB, CKMBINDEX, TROPONINI in the last 168 hours. CBG (last 3)  No results for input(s): GLUCAP in the last 72 hours. Recent Results (from the past 240 hour(s))  SARS Coronavirus 2 (CEPHEID - Performed in [place] hospital  lab), Hosp Order     Status: None   Collection Time: 04/14/19  2:13 PM  Result Value Ref Range Status   SARS Coronavirus 2 NEGATIVE NEGATIVE Final    Comment: (NOTE) If result is NEGATIVE SARS-CoV-2 target nucleic acids are NOT DETECTED. The SARS-CoV-2 RNA is generally detectable in upper and lower  respiratory specimens during the acute phase of infection. The lowest  concentration of SARS-CoV-2 viral copies this assay can detect is 250  copies / mL. A negative result does not preclude SARS-CoV-2 infection  and should not be used as the sole basis for treatment or other  patient management decisions.  A negative result may occur with  improper specimen collection / handling, submission of specimen other  than nasopharyngeal swab, presence of viral mutation(s) within the  areas targeted by this assay, and inadequate number of viral copies  (<250 copies / mL). A negative result must be combined with clinical  observations, patient history, and epidemiological information. If result is POSITIVE SARS-CoV-2 target nucleic acids are DETECTED. The SARS-CoV-2 RNA is generally detectable in upper and lower  respiratory specimens dur ing the acute phase of infection.  Positive  results are indicative of active infection with SARS-CoV-2.  Clinical  correlation with patient history and other diagnostic information is  necessary to determine patient infection status.  Positive results do  not rule out bacterial infection or co-infection with other viruses. If result is PRESUMPTIVE POSTIVE SARS-CoV-2 nucleic acids MAY BE PRESENT.   A presumptive positive result was obtained on the submitted specimen  and confirmed on repeat testing.  While 2019 novel coronavirus  (SARS-CoV-2) nucleic acids may be present in the submitted sample  additional confirmatory testing may be necessary for epidemiological  and / or clinical management purposes  to differentiate between  SARS-CoV-2 and other Sarbecovirus  currently known to infect humans.  If clinically indicated additional testing with an alternate test  methodology [phone]) is advised. The SARS-CoV-2 RNA is generally  detectable in upper and lower respiratory sp ecimens during the acute  phase of infection. The expected result is Negative. Fact Sheet for Patients:  StrictlyIdeas.no Fact Sheet for Healthcare Providers: BankingDealers.co.za This test is not yet approved or cleared by the [place] FDA and has been authorized for detection and/or diagnosis of SARS-CoV-2 by FDA under an Emergency Use Authorization (EUA).  This EUA will remain in effect (meaning this test can be used) for the duration of the COVID-19 declaration under Section 564(b)(1) of the Act, 21 U.S.C. section 360bbb-3(b)(1), unless the authorization is terminated or revoked sooner. Performed at [facility], [address], [place], [place] [address]  Studies: Dg Chest 2 View  Result Date: 04/14/2019 CLINICAL DATA:  Anemia.  History of lung cancer. EXAM: CHEST - 2 VIEW COMPARISON:  Chest x-ray 01/24/2019 and chest CT 03/24/2019 FINDINGS: The cardiac silhouette, mediastinal and hilar contours are within normal limits and stable. There is mild tortuosity of the thoracic aorta. Mild chronic emphysematous changes with areas of pulmonary scarring. No infiltrates, edema or effusions. No worrisome pulmonary lesions. IMPRESSION: Chronic emphysematous changes and pulmonary scarring but no definite acute overlying pulmonary process or new pulmonary lesions. Electronically Signed   By: Marijo Sanes M.D.   On: 04/14/2019 13:20   US Renal  Result Date: 04/15/2019 CLINICAL DATA:  Acute renal disease.  Chronic renal disease. EXAM: RENAL / URINARY TRACT ULTRASOUND COMPLETE COMPARISON:  PET-CT 01/22/2019. FINDINGS: Right Kidney: Renal measurements: 12.8 x 6.0 x 6.1 cm = volume: 242.3 mL. Increased echogenicity. 8.8 cm simple cyst. 3.6  cm simple cyst. No hydronephrosis visualized. Left Kidney: Renal measurements: 12.9 x 6.1 x 5.3 cm = volume: 219.5 mL. Increased echogenicity. No mass or hydronephrosis visualized. Bladder: Appears normal for degree of bladder distention. IMPRESSION: Increased echogenicity both kidneys. This consistent chronic medical renal disease. Two simple cyst right kidney. No evidence of hydronephrosis or bladder distention. No acute abnormality identified. Electronically Signed   By: [place]   On: 04/15/2019 11:33     Scheduled Meds: . calcitRIOL  0.25 mcg Oral Daily  . levothyroxine  175 mcg Oral QAC breakfast  . metoprolol tartrate  12.5 mg Oral BID  . pantoprazole  40 mg Oral Daily  . simvastatin  10 mg Oral q1800  . sucralfate  1 g Oral TID WC & HS   Continuous Infusions: . magnesium sulfate bolus IVPB    .  sodium bicarbonate  infusion 1000 mL      Principal Problem:   Acute renal failure (ARF) (HCC) Active Problems:   Coronary atherosclerosis   Chronic kidney disease   GERD (gastroesophageal reflux disease)   CAD (coronary artery disease)   Hyperlipidemia   Hypertension   Gout   Hypothyroidism   Anemia in chronic kidney disease (CKD)   Metabolic acidosis  Time spent:   Irwin Brakeman, MD [facility] 04/15/2019, 11:48 AM    LOS: 1 day  How to contact the [facility] Attending or Consulting provider [place] or covering provider during after hours [place], for this patient?  1. Check the care team in [facility] and look for a) attending/consulting [facility] provider listed and b) the [facility] team listed 2. Log into [email or web address] and use [place]'s universal password to access. If you do not have the password, please contact the hospital operator. 3. Locate the [facility] provider you are looking for under [facility] and page to a number that you can be directly reached. 4. If you still have difficulty reaching the provider, please page the [facility] (Director on Call) for the Hospitalists  listed on amion for assistance.

## 2019-04-15 NOTE — Consult Note (Addendum)
[facility] Nephrology Consultation Note  Requesting MD: Dr Irwin Brakeman, MD Reason for consult: AKI  HPI:  Jeremy Johnson is a 73 y.o. male with history of hypertension, hyperlipidemia, abdominal aortic aneurysm repair in 2006 causing damage to the kidney and then CKD with baseline creatinine around 2.2 per patient, close to the small cell lung cancer in the right upper lobe status post 3 cycles of carboplatin and etoposide, last chemo on 5/18, sent to the hospital for the evaluation of abnormal lab including hemoglobin 6.9 and serum creatinine level 9.86.  The creatinine level gradually increasing from 2.79 on 03/24/2019, 4.81 on 5/26, 9.86 on 6/1 and 10.18 on 04/15/2019.  BUN 82, CO2 14, potassium 4.9.  Patient was treated with IV fluid.  He received red blood cell transfusion yesterday. He was on losartan and hydrochlorothiazide until a week ago when it was discontinued because of rising creatinine level.  Patient received Neulasta on 04/02/2019.  He reports dark urine.  He denied headache, dizziness, nausea, vomiting, chest pain, abdomen pain, diarrhea or constipation.  Occasional shortness of breath is a stable.  She denies any uremic symptoms including loss of appetite, dysgeusia, poor sleep.  The urine output is recorded 650 cc overnight.  Denies use of NSAIDs.  No recent IV contrast.  No family history of kidney disease.  Creatinine  Date/Time Value Ref Range Status  04/14/2019 09:59 AM 9.52 (HH) 0.61 - 1.24 mg/dL Final    Comment:    CRITICAL RESULT CALLED TO, READ BACK BY AND VERIFIED WITH: DIANE BELL, RN AT 1045 BY DM   04/08/2019 09:46 AM 4.81 (HH) 0.61 - 1.24 mg/dL Final    Comment:    CRITICAL RESULT CALLED TO, READ BACK BY AND VERIFIED WITH: [place] 04/08/2019 at 1043. RQ   03/31/2019 10:18 AM 3.37 (HH) 0.61 - 1.24 mg/dL Final    Comment:    CRITICAL RESULT CALLED TO, READ BACK BY AND VERIFIED WITH: Beth Tracey 03/31/2019 at 1105. RQ    03/24/2019 02:13 PM 2.79 (H) 0.61 - 1.24 mg/dL Final  03/17/2019 01:50 PM 2.67 (H) 0.61 - 1.24 mg/dL Final  03/10/2019 09:20 AM 2.28 (H) 0.61 - 1.24 mg/dL Final  03/03/2019 02:13 PM 2.24 (H) 0.61 - 1.24 mg/dL Final  02/25/2019 02:49 PM 2.31 (H) 0.61 - 1.24 mg/dL Final  02/17/2019 08:20 AM 2.25 (H) 0.61 - 1.24 mg/dL Final  02/06/2019 02:30 PM 2.24 (H) 0.61 - 1.24 mg/dL Final   Creatinine, Ser  Date/Time Value Ref Range Status  04/15/2019 12:55 AM 10.18 (H) 0.61 - 1.24 mg/dL Final  04/14/2019 12:47 PM 9.86 (H) 0.61 - 1.24 mg/dL Final  01/24/2019 08:34 AM 2.40 (H) 0.61 - 1.24 mg/dL Final     PMHx:   Past Medical History:  Diagnosis Date  . Anemia   . Blood transfusion without reported diagnosis   . CAD (coronary artery disease)    STENT... MID CIRCUMFLEX...1997  . Cancer ([place])   . Chronic kidney disease    STAGE 3  . Degenerative joint disease (DJD) of lumbar spine   . GERD (gastroesophageal reflux disease)   . Gout   . Hyperlipidemia   . Hypertension   . Hypothyroidism   . Incisional hernia   . Leukocytosis    CHRONIC MILD  . Myocardial infarction ([employer])    1997    Past Surgical History:  Procedure Laterality Date  . ABDOMINAL AORTIC ANEURYSM REPAIR  2006  . BIOPSY  09/30/2018   Procedure: BIOPSY;  Surgeon: Danie Binder, MD;  [facility];  Service: Endoscopy;;  ascending colon  . COLONOSCOPY  2008  . COLONOSCOPY N/A 09/30/2018   Procedure: COLONOSCOPY;  Surgeon: Danie Binder, MD;  [facility];  Service: Endoscopy;  Laterality: N/A;  9:00  . CORONARY ANGIOPLASTY WITH STENT PLACEMENT  2008   MID CIRCUMFLEX  . POLYPECTOMY  09/30/2018   Procedure: POLYPECTOMY;  Surgeon: Danie Binder, MD;  [facility];  Service: Endoscopy;;  colon  . VIDEO BRONCHOSCOPY WITH ENDOBRONCHIAL NAVIGATION N/A 01/27/2019   Procedure: VIDEO BRONCHOSCOPY WITH ENDOBRONCHIAL NAVIGATION;  Surgeon: Grace Isaac, MD;  Location: [place];  Service:  Thoracic;  Laterality: N/A;  . VIDEO BRONCHOSCOPY WITH ENDOBRONCHIAL ULTRASOUND N/A 01/27/2019   Procedure: VIDEO BRONCHOSCOPY WITH ENDOBRONCHIAL ULTRASOUND;  Surgeon: Grace Isaac, MD;  Location[facility];  Service: Thoracic;  Laterality: N/A;    Family Hx:  Family History  Problem Relation Age of Onset  . Stroke Brother   . Lung cancer Sister 67       lung cancer/former    Social History:  reports that he quit smoking about 18 months ago. He has a 27.00 pack-year smoking history. He has never used smokeless tobacco. He reports current alcohol use. He reports that he does not use drugs.  Allergies:  Allergies  Allergen Reactions  . Penicillins Rash and Other (See Comments)    Has patient had a PCN reaction causing immediate rash, facial/tongue/throat swelling, SOB or lightheadedness with hypotension: No Has patient had a PCN reaction causing severe rash involving mucus membranes or skin necrosis: No Has patient had a PCN reaction that required hospitalization: No Has patient had a PCN reaction occurring within the last 10 years: No If all of the above answers are "NO", then may proceed with Cephalosporin use.     Medications: Prior to Admission medications   Medication Sig Start Date End Date Taking? Authorizing Provider  allopurinol (ZYLOPRIM) 300 MG tablet Take 300 mg by mouth daily.   Yes [provider]  amLODipine (NORVASC) 5 MG tablet Take 5 mg by mouth daily.  03/12/18  Yes [provider]  aspirin EC 81 MG tablet Take 81 mg by mouth daily.   Yes [provider]  calcitRIOL (ROCALTROL) 0.25 MCG capsule Take 0.25 mcg by mouth daily.  11/25/18  Yes [provider]  levothyroxine (SYNTHROID, LEVOTHROID) 175 MCG tablet Take 175 mcg by mouth daily before breakfast.   Yes [provider]  losartan-hydrochlorothiazide (HYZAAR) 100-25 MG tablet Take 1 tablet by mouth daily.   Yes [provider]  metoprolol tartrate (LOPRESSOR)  50 MG tablet Take 25 mg by mouth 2 (two) times daily.    Yes [provider]  mometasone (ELOCON) 0.1 % ointment Apply 1 application topically 2 (two) times daily as needed (for eczema).  07/11/18  Yes [provider]  omeprazole (PRILOSEC) 20 MG capsule Take 20 mg by mouth daily.   Yes [provider]  prochlorperazine (COMPAZINE) 10 MG tablet Take 1 tablet (10 mg total) by mouth every 6 (six) hours as needed for nausea or vomiting. 02/06/19  Yes Curt Bears, MD  simvastatin (ZOCOR) 20 MG tablet Take 20 mg by mouth daily.  03/12/18  Yes [provider]  sucralfate (CARAFATE) 1 g tablet Take 1 tablet (1 g total) by mouth 4 (four) times daily -  with meals and at bedtime. 5 min before meals for radiation induced esophagitis 04/11/19  Yes Tyler Pita, MD    I have reviewed the patient's current medications.  Labs:  Results for orders placed or performed during the hospital encounter of 04/14/19 (from the past 48 hour(s))  Type and screen [facility]     Status: None   Collection Time: 04/14/19 12:45 PM  Result Value Ref Range   ABO/RH(D) O POS    Antibody Screen NEG    Sample Expiration 04/17/2019,2359    Unit Number [id number]    Blood Component Type RED CELLS,LR    Unit division 00    Status of Unit ISSUED,FINAL    Transfusion Status OK TO TRANSFUSE    Crossmatch Result Compatible    Unit Number [id number]    Blood Component Type RED CELLS,LR    Unit division 00    Status of Unit ISSUED,FINAL    Transfusion Status OK TO TRANSFUSE    Crossmatch Result      Compatible Performed at [facility], [address], [place], [place] [address]   Prepare RBC     Status: None   Collection Time: 04/14/19 12:45 PM  Result Value Ref Range   Order Confirmation      ORDER PROCESSED BY BLOOD BANK Performed at [facility], [address] [place], [place] [address]   ABO/Rh     Status: None   Collection Time: 04/14/19 12:45 PM  Result  Value Ref Range   ABO/RH(D)      O POS Performed at [facility], [address], [place], [place] [address]   CBC with Differential     Status: Abnormal   Collection Time: 04/14/19 12:47 PM  Result Value Ref Range   WBC 12.2 (H) 4.0 - 10.5 K/uL   RBC 2.17 (L) 4.22 - 5.81 MIL/uL   Hemoglobin 6.9 (LL) 13.0 - 17.0 g/dL    Comment: REPEATED TO VERIFY THIS CRITICAL RESULT HAS VERIFIED AND BEEN CALLED TO MANDY CREWS BY HILLARY FLYNT ON 06 01 2020 AT 1257, AND HAS BEEN READ BACK.     HCT 19.9 (L) 39.0 - 52.0 %   MCV 91.7 80.0 - 100.0 fL   MCH 31.8 26.0 - 34.0 pg   MCHC 34.7 30.0 - 36.0 g/dL   RDW 15.9 (H) 11.5 - 15.5 %   Platelets 32 (L) 150 - 400 K/uL    Comment: PLATELET COUNT CONFIRMED BY SMEAR SPECIMEN CHECKED FOR CLOTS Immature Platelet Fraction may be clinically indicated, consider ordering this additional test [id number]    nRBC 0.0 0.0 - 0.2 %   Neutrophils Relative % 74 %   Neutro Abs 8.9 (H) 1.7 - 7.7 K/uL   Lymphocytes Relative 14 %   Lymphs Abs 1.7 0.7 - 4.0 K/uL   Monocytes Relative 9 %   Monocytes Absolute 1.1 (H) 0.1 - 1.0 K/uL   Eosinophils Relative 2 %   Eosinophils Absolute 0.3 0.0 - 0.5 K/uL   Basophils Relative 0 %   Basophils Absolute 0.0 0.0 - 0.1 K/uL   Immature Granulocytes 1 %   Abs Immature Granulocytes 0.15 (H) 0.00 - 0.07 K/uL    Comment: Performed at [facility], [address], [place], [place] [address]  Comprehensive metabolic panel     Status: Abnormal   Collection Time: 04/14/19 12:47 PM  Result Value Ref Range   Sodium 133 (L) 135 - 145 mmol/L   Potassium 4.4 3.5 - 5.1 mmol/L   Chloride 103 98 - 111 mmol/L   CO2 16 (L) 22 - 32 mmol/L  Glucose, Bld 113 (H) 70 - 99 mg/dL   BUN 82 (H) 8 - 23 mg/dL   Creatinine, Ser 9.86 (H) 0.61 - 1.24 mg/dL   Calcium 7.8 (L) 8.9 - 10.3 mg/dL   Total Protein 6.3 (L) 6.5 - 8.1 g/dL   Albumin 2.7 (L) 3.5 - 5.0 g/dL   AST 13 (L) 15 - 41 U/L   ALT 12 0 - 44 U/L   Alkaline Phosphatase 90 38 - 126 U/L   Total  Bilirubin 0.3 0.3 - 1.2 mg/dL   GFR calc non Af Amer 5 (L) >60 mL/min   GFR calc Af Amer 5 (L) >60 mL/min   Anion gap 14 5 - 15    Comment: Performed at [facility], [address], [place], [place] [address]  Urinalysis, Routine w reflex microscopic     Status: Abnormal   Collection Time: 04/14/19 12:47 PM  Result Value Ref Range   Color, Urine STRAW (A) YELLOW   APPearance CLEAR CLEAR   Specific Gravity, Urine 1.008 1.005 - 1.030   pH 7.0 5.0 - 8.0   Glucose, UA 50 (A) NEGATIVE mg/dL   Hgb urine dipstick MODERATE (A) NEGATIVE   Bilirubin Urine NEGATIVE NEGATIVE   Ketones, ur NEGATIVE NEGATIVE mg/dL   Protein, ur >=300 (A) NEGATIVE mg/dL   Nitrite NEGATIVE NEGATIVE   Leukocytes,Ua NEGATIVE NEGATIVE   RBC / HPF >50 (H) 0 - 5 RBC/hpf   WBC, UA 0-5 0 - 5 WBC/hpf   Bacteria, UA RARE (A) NONE SEEN    Comment: Performed at [facility], [address], [place], [place] [address]  C-reactive protein     Status: Abnormal   Collection Time: 04/14/19 12:47 PM  Result Value Ref Range   CRP 5.0 (H) <1.0 mg/dL    Comment: Performed at [facility], [address], [place], [place] [address]  SARS Coronavirus 2 (CEPHEID - Performed in [facility] hospital lab), Hosp Order     Status: None   Collection Time: 04/14/19  2:13 PM  Result Value Ref Range   SARS Coronavirus 2 NEGATIVE NEGATIVE    Comment: (NOTE) If result is NEGATIVE SARS-CoV-2 target nucleic acids are NOT DETECTED. The SARS-CoV-2 RNA is generally detectable in upper and lower  respiratory specimens during the acute phase of infection. The lowest  concentration of SARS-CoV-2 viral copies this assay can detect is 250  copies / mL. A negative result does not preclude SARS-CoV-2 infection  and should not be used as the sole basis for treatment or other  patient management decisions.  A negative result may occur with  improper specimen collection / handling, submission of specimen other  than nasopharyngeal swab, presence of viral  mutation(s) within the  areas targeted by this assay, and inadequate number of viral copies  (<250 copies / mL). A negative result must be combined with clinical  observations, patient history, and epidemiological information. If result is POSITIVE SARS-CoV-2 target nucleic acids are DETECTED. The SARS-CoV-2 RNA is generally detectable in upper and lower  respiratory specimens dur ing the acute phase of infection.  Positive  results are indicative of active infection with SARS-CoV-2.  Clinical  correlation with patient history and other diagnostic information is  necessary to determine patient infection status.  Positive results do  not rule out bacterial infection or co-infection with other viruses. If result is PRESUMPTIVE POSTIVE SARS-CoV-2 nucleic acids MAY BE PRESENT.   A presumptive positive result was obtained on the submitted specimen  and confirmed  on repeat testing.  While 2019 novel coronavirus  (SARS-CoV-2) nucleic acids may be present in the submitted sample  additional confirmatory testing may be necessary for epidemiological  and / or clinical management purposes  to differentiate between  SARS-CoV-2 and other Sarbecovirus currently known to infect humans.  If clinically indicated additional testing with an alternate test  methodology [phone]) is advised. The SARS-CoV-2 RNA is generally  detectable in upper and lower respiratory sp ecimens during the acute  phase of infection. The expected result is Negative. Fact Sheet for Patients:  StrictlyIdeas.no Fact Sheet for Healthcare Providers: BankingDealers.co.za This test is not yet approved or cleared by the [place] FDA and has been authorized for detection and/or diagnosis of SARS-CoV-2 by FDA under an Emergency Use Authorization (EUA).  This EUA will remain in effect (meaning this test can be used) for the duration of the COVID-19 declaration under Section 564(b)(1)  of the Act, 21 U.S.C. section 360bbb-3(b)(1), unless the authorization is terminated or revoked sooner. Performed at [facility], [address] [place], [place] [address]   Na and K (sodium & potassium), rand urine     Status: None   Collection Time: 04/14/19  4:19 PM  Result Value Ref Range   Sodium, Ur 69 mmol/L   Potassium Urine 27 mmol/L    Comment: Performed at [facility], [address] [place], [place] [address]  Chloride, urine, random     Status: None   Collection Time: 04/14/19  4:19 PM  Result Value Ref Range   Chloride Urine 71 mmol/L    Comment: Performed at [facility], [address], [place], [place] [address]  Sedimentation rate     Status: Abnormal   Collection Time: 04/15/19 12:42 AM  Result Value Ref Range   Sed Rate 107 (H) 0 - 16 mm/hr    Comment: Performed at [facility], [address], [place], [place] [address]  Hemoglobin and hematocrit, blood     Status: Abnormal   Collection Time: 04/15/19 12:44 AM  Result Value Ref Range   Hemoglobin 7.7 (L) 13.0 - 17.0 g/dL   HCT 23.2 (L) 39.0 - 52.0 %    Comment: Performed at [facility], [address], [place], [place] [address]  Renal function panel     Status: Abnormal   Collection Time: 04/15/19 12:55 AM  Result Value Ref Range   Sodium 136 135 - 145 mmol/L   Potassium 4.9 3.5 - 5.1 mmol/L   Chloride 109 98 - 111 mmol/L   CO2 14 (L) 22 - 32 mmol/L   Glucose, Bld 103 (H) 70 - 99 mg/dL   BUN 82 (H) 8 - 23 mg/dL   Creatinine, Ser 10.18 (H) 0.61 - 1.24 mg/dL   Calcium 7.4 (L) 8.9 - 10.3 mg/dL   Phosphorus 6.4 (H) 2.5 - 4.6 mg/dL   Albumin 2.3 (L) 3.5 - 5.0 g/dL   GFR calc non Af Amer 5 (L) >60 mL/min   GFR calc Af Amer 5 (L) >60 mL/min   Anion gap 13 5 - 15    Comment: Performed at [facility], [address], [place], [place] [address]  Magnesium     Status: Abnormal   Collection Time: 04/15/19 12:55 AM  Result Value Ref Range   Magnesium 1.4 (L) 1.7 - 2.4 mg/dL    Comment: Performed at [facility], [address], [place], [place] [address]     ROS:  Pertinent items noted in HPI and remainder of comprehensive  ROS otherwise negative.  Physical Exam: Vitals:   04/15/19 0814 04/15/19 0916  BP:  135/81  Pulse:  97  Resp:    Temp:    SpO2: 96%      General exam: Appears calm and comfortable  Respiratory system: Clear to auscultation. Respiratory effort normal. No wheezing or crackle Cardiovascular system: S1 & S2 heard, RRR.  No pedal edema. Gastrointestinal system: Abdomen is nondistended, soft and nontender. Normal bowel sounds heard. Central nervous system: Alert and oriented. No focal neurological deficits. Extremities: Symmetric 5 x 5 power. Skin: No rashes, lesions or ulcers Psychiatry: Judgement and insight appear normal. Mood & affect appropriate.   Assessment/Plan:  #Acute kidney injury on CKD likely due to combination of carboplatin in association with losartan/HCTZ and anemia: Other differential could be glomerulonephritis due to Neulasta.  Urinalysis with proteinuria and hematuria.  He is nonoliguric and has no uremic symptoms. -Repeat urinalysis, check urine electrolytes, urine protein creatinine ratio -Bladder scan and kidney ultrasound to rule out obstruction -Change IV fluid to sodium bicarbonate.  -Monitor BMP, urine output and avoid nephrotoxins. -No need for dialysis at this time, hopefully the kidney function will recover gradually.  I have discussed this with the patient.  Continue to hold losartan/HCTZ. Hold allopurinol.  #Metabolic acidosis due to renal failure: Sodium bicarbonate IV as above.  #Hypomagnesemia: Due to carboplatin.  Replete magnesium sulfate.  Monitor lab.  #Hypertension: Blood pressure acceptable.  Continue metoprolol.  #Anemia due to chemotherapy: Received 2 units of PRBC transfusion on 04/14/2019.  Monitor CBC.  #Thrombocytopenia: Due to chemo.  No sign of bleeding.  #Small cell lung cancer: Receiving chemotherapy.  Per  oncology.  Thank you for the consult.  We will continue to follow with you.  Beckhem Isadore Tanna Furry 04/15/2019, 9:20 AM  [employer].

## 2019-04-15 NOTE — Progress Notes (Signed)
Patient c/o having small clots with urination.  Patient is able to void without issues, but small clots pass with urination.  Urine is still yellow.  Notified mid-level.  No new orders received at this time.

## 2019-04-16 ENCOUNTER — Ambulatory Visit: Payer: Medicare Other

## 2019-04-16 DIAGNOSIS — E782 Mixed hyperlipidemia: Secondary | ICD-10-CM

## 2019-04-16 DIAGNOSIS — C3490 Malignant neoplasm of unspecified part of unspecified bronchus or lung: Secondary | ICD-10-CM

## 2019-04-16 DIAGNOSIS — C3411 Malignant neoplasm of upper lobe, right bronchus or lung: Secondary | ICD-10-CM

## 2019-04-16 DIAGNOSIS — E038 Other specified hypothyroidism: Secondary | ICD-10-CM

## 2019-04-16 DIAGNOSIS — N183 Chronic kidney disease, stage 3 unspecified: Secondary | ICD-10-CM

## 2019-04-16 DIAGNOSIS — N179 Acute kidney failure, unspecified: Secondary | ICD-10-CM

## 2019-04-16 LAB — RENAL FUNCTION PANEL
Albumin: 2.3 g/dL — ABNORMAL LOW (ref 3.5–5.0)
Anion gap: 14 (ref 5–15)
BUN: 79 mg/dL — ABNORMAL HIGH (ref 8–23)
CO2: 15 mmol/L — ABNORMAL LOW (ref 22–32)
Calcium: 7.9 mg/dL — ABNORMAL LOW (ref 8.9–10.3)
Chloride: 106 mmol/L (ref 98–111)
Creatinine, Ser: 10.73 mg/dL — ABNORMAL HIGH (ref 0.61–1.24)
GFR calc Af Amer: 5 mL/min — ABNORMAL LOW (ref >60)
GFR calc non Af Amer: 4 mL/min — ABNORMAL LOW (ref >60)
Glucose, Bld: 105 mg/dL — ABNORMAL HIGH (ref 70–99)
Phosphorus: 7.1 mg/dL — ABNORMAL HIGH (ref 2.5–4.6)
Potassium: 3.8 mmol/L (ref 3.5–5.1)
Sodium: 135 mmol/L (ref 135–145)

## 2019-04-16 LAB — CBC
HCT: 24.2 % — ABNORMAL LOW (ref 39.0–52.0)
Hemoglobin: 8.2 g/dL — ABNORMAL LOW (ref 13.0–17.0)
MCH: 29.9 pg (ref 26.0–34.0)
MCHC: 33.9 g/dL (ref 30.0–36.0)
MCV: 88.3 fL (ref 80.0–100.0)
Platelets: 45 10*3/uL — ABNORMAL LOW (ref 150–400)
RBC: 2.74 MIL/uL — ABNORMAL LOW (ref 4.22–5.81)
RDW: 15 % (ref 11.5–15.5)
WBC: 11.4 10*3/uL — ABNORMAL HIGH (ref 4.0–10.5)
nRBC: 0 % (ref 0.0–0.2)

## 2019-04-16 LAB — MAGNESIUM: Magnesium: 1.7 mg/dL (ref 1.7–2.4)

## 2019-04-16 MED ORDER — SODIUM BICARBONATE 8.4 % IV SOLN
INTRAVENOUS | Status: DC
  Administered 2019-04-16 (×2): via INTRAVENOUS
  Filled 2019-04-16 (×6): qty 150

## 2019-04-16 NOTE — Progress Notes (Signed)
Patient had witnessed clot in urine this am. Clot less than a quarter but larger than a dime. Total output 450. Patient denies any pain. Started 24 hour collection. Bottle in patient bathroom, labeled, dated and timed.

## 2019-04-16 NOTE — Progress Notes (Signed)
PROGRESS NOTE  Jeremy Johnson QHU:[id number] DOB: 04-03-1946 DOA: 04/14/2019 PCP: [name], MD  Brief History:  73 year old male with a history of limited stage small cell lung cancer, hypertension, anemia of CKD, CKD stage III, hyperlipidemia, hypothyroidism, coronary artery disease presenting secondary to abnormal labs.  Patient was contacted by his medical oncologist, Dr. Earlie Server secondary to elevated serum creatinine on routine blood work of 9.52.  In addition, the patient was noted to have hemoglobin of 6.9.  Patient himself was feeling pretty well except for some mild generalized weakness.  He had denied any fevers, chills, chest pain, shortness breath, coughing, hemoptysis, nausea, vomiting, diarrhea, abdominal pain, dysuria, hematuria.  The patient denies any new medications or NSAIDs.  Upon presentation, the patient was noted to have serum creatinine of 9.52 and hemoglobin 6.9.  The patient was transfused with 2 units PRBC.  Nephrology was consulted to assist with management.  Assessment/Plan: Acute on chronic renal failure--CKD stage III -Baseline creatinine 2.2-2.4 -Nephrology consult appreciated -24-hour urine initiated -Work-up in progress -Renal ultrasound negative for hydronephrosis; suggest medical renal disease -ESR 107 -may be due to carboplatin in setting of losartan/HCTZ which have been held -last chemo 4/65  Metabolic acidosis -Continue bicarbonate drip per nephrology  Anemia of CKD -Check FOBT -Transfused 2 units PRBC  Essential hypertension -Holding losartan/HCTZ and amlodipine -Continue metoprolol titrate  Anemia of CKD and chemotherapy -Check FOBT  Limited stage small cell lung cancer -Initially diagnosed in March 2020 -Last chemotherapy 03/31/2019--cycle 3 -Last XRT 03/17/2019 -Follow-up with Dr. Earlie Server after discharge -Received 2 units PRBC  Thrombocytopenia -Secondary to chemotherapy -No signs of active bleeding -A.m.  CBC  Hypothyroidism -continue synthroid  Coronary Artery Disease -ASA on hold due to thrombocytopenia -continue metoprolol and statin -no chest pain presently     Disposition Plan:   Not stable for D/C--several days pending renal recovery Family Communication:   No Family at bedside  Consultants:  Renal/ med/onc  Code Status:  FULL   DVT Prophylaxis:  SCDs   Procedures: As Listed in Progress Note Above  Antibiotics: None    Subjective: Patient denies fevers, chills, headache, chest pain, dyspnea, nausea, vomiting, diarrhea, abdominal pain, dysuria, hematuria, hematochezia, and melena.   Objective: Vitals:   04/15/19 1316 04/15/19 2053 04/16/19 0519 04/16/19 1449  BP: 128/68 (!) 146/77 (!) 150/80 135/87  Pulse: 81 92 97 78  Resp: '16 20 20 16  ' Temp: 97.8 F (36.6 C) 98.6 F (37 C) 99.2 F (37.3 C) 98.5 F (36.9 C)  TempSrc: Oral Oral Oral Oral  SpO2: 97% 94% 96% 95%  Weight:   93.1 kg   Height:        Intake/Output Summary (Last 24 hours) at 04/16/2019 1516 Last data filed at 04/16/2019 1500 Gross per 24 hour  Intake 1492.14 ml  Output 2850 ml  Net -1357.86 ml   Weight change: -1.27 kg Exam:   General:  Pt is alert, follows commands appropriately, not in acute distress  HEENT: No icterus, No thrush, No neck mass, [place]/AT  Cardiovascular: RRR, S1/S2, no rubs, no gallops  Respiratory: CTA bilaterally, no wheezing, no crackles, no rhonchi  Abdomen: Soft/+BS, non tender, non distended, no guarding  Extremities: No edema, No lymphangitis, No petechiae, No rashes, no synovitis   Data Reviewed: I have personally reviewed following labs and imaging studies Basic Metabolic Panel: Recent Labs  Lab 04/14/19 0959 04/14/19 1247 04/15/19 0055 04/16/19 0442  NA 134* 133* 136  135  K 4.4 4.4 4.9 3.8  CL 106 103 109 106  CO2 18* 16* 14* 15*  GLUCOSE 152* 113* 103* 105*  BUN 80* 82* 82* 79*  CREATININE 9.52* 9.86* 10.18* 10.73*  CALCIUM 7.7* 7.8* 7.4*  7.9*  MG  --   --  1.4* 1.7  PHOS  --   --  6.4* 7.1*   Liver Function Tests: Recent Labs  Lab 04/14/19 0959 04/14/19 1247 04/15/19 0055 04/16/19 0442  AST 11* 13*  --   --   ALT 9 12  --   --   ALKPHOS 97 90  --   --   BILITOT 0.3 0.3  --   --   PROT 5.9* 6.3*  --   --   ALBUMIN 2.4* 2.7* 2.3* 2.3*   No results for input(s): LIPASE, AMYLASE in the last 168 hours. No results for input(s): AMMONIA in the last 168 hours. Coagulation Profile: No results for input(s): INR, PROTIME in the last 168 hours. CBC: Recent Labs  Lab 04/14/19 0959 04/14/19 1247 04/15/19 0044 04/15/19 0854 04/16/19 0442  WBC 11.5* 12.2*  --  11.2* 11.4*  NEUTROABS 8.6* 8.9*  --   --   --   HGB 6.8* 6.9* 7.7* 8.9* 8.2*  HCT 20.3* 19.9* 23.2* 26.1* 24.2*  MCV 92.7 91.7  --  88.8 88.3  PLT 34* 32*  --  38* 45*   Cardiac Enzymes: No results for input(s): CKTOTAL, CKMB, CKMBINDEX, TROPONINI in the last 168 hours. BNP: Invalid input(s): POCBNP CBG: No results for input(s): GLUCAP in the last 168 hours. HbA1C: No results for input(s): HGBA1C in the last 72 hours. Urine analysis:    Component Value Date/Time   COLORURINE STRAW (A) 04/15/2019 0913   APPEARANCEUR CLEAR 04/15/2019 0913   LABSPEC 1.010 04/15/2019 0913   PHURINE 7.0 04/15/2019 0913   GLUCOSEU 50 (A) 04/15/2019 0913   HGBUR MODERATE (A) 04/15/2019 0913   BILIRUBINUR NEGATIVE 04/15/2019 0913   KETONESUR NEGATIVE 04/15/2019 0913   PROTEINUR >=300 (A) 04/15/2019 0913   NITRITE NEGATIVE 04/15/2019 0913   LEUKOCYTESUR NEGATIVE 04/15/2019 0913   Sepsis Labs: '@LABRCNTIP' (procalcitonin:4,lacticidven:4) ) Recent Results (from the past 240 hour(s))  SARS Coronavirus 2 (CEPHEID - Performed in [place] hospital lab), Hosp Order     Status: None   Collection Time: 04/14/19  2:13 PM  Result Value Ref Range Status   SARS Coronavirus 2 NEGATIVE NEGATIVE Final    Comment: (NOTE) If result is NEGATIVE SARS-CoV-2 target nucleic acids are NOT  DETECTED. The SARS-CoV-2 RNA is generally detectable in upper and lower  respiratory specimens during the acute phase of infection. The lowest  concentration of SARS-CoV-2 viral copies this assay can detect is 250  copies / mL. A negative result does not preclude SARS-CoV-2 infection  and should not be used as the sole basis for treatment or other  patient management decisions.  A negative result may occur with  improper specimen collection / handling, submission of specimen other  than nasopharyngeal swab, presence of viral mutation(s) within the  areas targeted by this assay, and inadequate number of viral copies  (<250 copies / mL). A negative result must be combined with clinical  observations, patient history, and epidemiological information. If result is POSITIVE SARS-CoV-2 target nucleic acids are DETECTED. The SARS-CoV-2 RNA is generally detectable in upper and lower  respiratory specimens dur ing the acute phase of infection.  Positive  results are indicative of active infection with SARS-CoV-2.  Clinical  correlation with patient history and other diagnostic information is  necessary to determine patient infection status.  Positive results do  not rule out bacterial infection or co-infection with other viruses. If result is PRESUMPTIVE POSTIVE SARS-CoV-2 nucleic acids MAY BE PRESENT.   A presumptive positive result was obtained on the submitted specimen  and confirmed on repeat testing.  While 2019 novel coronavirus  (SARS-CoV-2) nucleic acids may be present in the submitted sample  additional confirmatory testing may be necessary for epidemiological  and / or clinical management purposes  to differentiate between  SARS-CoV-2 and other Sarbecovirus currently known to infect humans.  If clinically indicated additional testing with an alternate test  methodology [phone]) is advised. The SARS-CoV-2 RNA is generally  detectable in upper and lower respiratory sp ecimens during  the acute  phase of infection. The expected result is Negative. Fact Sheet for Patients:  StrictlyIdeas.no Fact Sheet for Healthcare Providers: BankingDealers.co.za This test is not yet approved or cleared by the [place] FDA and has been authorized for detection and/or diagnosis of SARS-CoV-2 by FDA under an Emergency Use Authorization (EUA).  This EUA will remain in effect (meaning this test can be used) for the duration of the COVID-19 declaration under Section 564(b)(1) of the Act, 21 U.S.C. section 360bbb-3(b)(1), unless the authorization is terminated or revoked sooner. Performed at [facility], [address] [place], [place] [address]      Scheduled Meds: . calcitRIOL  0.25 mcg Oral Daily  . levothyroxine  175 mcg Oral QAC breakfast  . metoprolol tartrate  12.5 mg Oral BID  . pantoprazole  40 mg Oral Daily  . simvastatin  10 mg Oral q1800  . sucralfate  1 g Oral TID WC & HS   Continuous Infusions: .  sodium bicarbonate  infusion 1000 mL 100 mL/hr at 04/16/19 1226    Procedures/Studies: Dg Chest 2 View  Result Date: 04/14/2019 CLINICAL DATA:  Anemia.  History of lung cancer. EXAM: CHEST - 2 VIEW COMPARISON:  Chest x-ray 01/24/2019 and chest CT 03/24/2019 FINDINGS: The cardiac silhouette, mediastinal and hilar contours are within normal limits and stable. There is mild tortuosity of the thoracic aorta. Mild chronic emphysematous changes with areas of pulmonary scarring. No infiltrates, edema or effusions. No worrisome pulmonary lesions. IMPRESSION: Chronic emphysematous changes and pulmonary scarring but no definite acute overlying pulmonary process or new pulmonary lesions. Electronically Signed   By: Marijo Sanes M.D.   On: 04/14/2019 13:20   Ct Chest Wo Contrast  Result Date: 03/25/2019 CLINICAL DATA:  Limited stage small cell lung cancer EXAM: CT CHEST WITHOUT CONTRAST TECHNIQUE: Multidetector CT imaging of the chest  was performed following the standard protocol without IV contrast. COMPARISON:  PET-CT dated 01/22/2019 FINDINGS: Cardiovascular: Heart is top-normal in size. No pericardial effusion. Stable focal aneurysmal dilatation anteriorly along the anterior aspect of the transverse aortic arch (series 2/image 50), measuring up to 4.7 cm, grossly unchanged. Atherosclerotic calcifications of the abdominal aorta and branch vessels. Coronary atherosclerosis of the LAD and left circumflex. Mediastinum/Nodes: No suspicious mediastinal lymphadenopathy. Lungs/Pleura: Prior perifissural nodule/mass anterior to the descending thoracic aorta has essentially resolved (series 7/image 58). Minimal residual scarring/radiation changes. Otherwise, no suspicious pulmonary nodules. No focal consolidation. Moderate centrilobular and paraseptal emphysematous changes, upper lobe predominant. No pleural effusion or pneumothorax. Upper Abdomen: Visualized upper abdomen is grossly unremarkable, noting vascular calcifications. Musculoskeletal: Degenerative changes of the visualized thoracolumbar spine. IMPRESSION: Prior perifissural nodule/mass anterior to the descending thoracic aorta has essentially resolved. Mild  residual scarring/radiation changes. No evidence of recurrent/metastatic disease. Aortic Atherosclerosis (ICD10-I70.0) and Emphysema (ICD10-J43.9). Electronically Signed   By: Julian Hy M.D.   On: 03/25/2019 08:49   US Renal  Result Date: 04/15/2019 CLINICAL DATA:  Acute renal disease.  Chronic renal disease. EXAM: RENAL / URINARY TRACT ULTRASOUND COMPLETE COMPARISON:  PET-CT 01/22/2019. FINDINGS: Right Kidney: Renal measurements: 12.8 x 6.0 x 6.1 cm = volume: 242.3 mL. Increased echogenicity. 8.8 cm simple cyst. 3.6 cm simple cyst. No hydronephrosis visualized. Left Kidney: Renal measurements: 12.9 x 6.1 x 5.3 cm = volume: 219.5 mL. Increased echogenicity. No mass or hydronephrosis visualized. Bladder: Appears normal for  degree of bladder distention. IMPRESSION: Increased echogenicity both kidneys. This consistent chronic medical renal disease. Two simple cyst right kidney. No evidence of hydronephrosis or bladder distention. No acute abnormality identified. Electronically Signed   By: Marcello Moores  Register   On: 04/15/2019 11:33    Orson Eva, DO  [facility] Pager [phone]  If 7PM-7AM, please contact night-coverage [email or web address] Password [email or web address] 04/16/2019, 3:16 PM   LOS: 2 days

## 2019-04-16 NOTE — Care Management Important Message (Signed)
Important Message  Patient Details  Name: Jeremy Johnson MRN: [id number] Date of Birth: April 23, 1946   Medicare Important Message Given:  Yes    [name] 04/16/2019, 2:01 PM

## 2019-04-16 NOTE — Consult Note (Signed)
[facility] Oncology Progress Note  Name: Jeremy Johnson      MRN: [id number]    Location: A306/A306-01  Date: 04/16/2019 Time:5:39 PM   Subjective: Interval History:Jeremy Johnson is seen today for follow-up of his acute renal failure in the setting of treatment for small cell lung cancer.  He is sitting in bed and having his dinner.  He denies any breathing difficulty while lying down.  He has good urine output of around 2400cc in the last 24 hours.  He does not have any uremic symptoms.  Does not report any nausea, vomiting or diarrhea.  Does not report any bleeding per rectum or melena.  Appetite is good.  Objective: Vital signs in last 24 hours: Temp:  [98.5 F (36.9 C)-99.2 F (37.3 C)] 98.5 F (36.9 C) (06/03 1449) Pulse Rate:  [78-97] 78 (06/03 1449) Resp:  [16-20] 16 (06/03 1449) BP: (135-150)/(77-87) 135/87 (06/03 1449) SpO2:  [94 %-96 %] 95 % (06/03 1449) Weight:  [205 lb 3.2 oz (93.1 kg)] 205 lb 3.2 oz (93.1 kg) (06/03 0519)    Intake/Output from previous day: 06/02 0800 - 06/03 0759 In: 1163.8 [P.O.:960; I.V.:153.8] Out: 2500 [Urine:2500]    Intake/Output this shift: Total I/O In: 1012.1 [P.O.:800; I.V.:212.1] Out: 750 [Urine:750]   PHYSICAL EXAM: BP 135/87 (BP Location: Left Arm)   Pulse 78   Temp 98.5 F (36.9 C) (Oral)   Resp 16   Ht 5\' 9"  (1.753 m)   Wt 205 lb 3.2 oz (93.1 kg)   SpO2 95%   BMI 30.30 kg/m  General appearance: alert, cooperative and appears stated age Lungs: clear to auscultation bilaterally Heart: regular rate and rhythm Abdomen: soft, non-tender; bowel sounds normal; no masses,  no organomegaly Extremities: extremities normal, atraumatic, no cyanosis or edema Skin: Skin color, texture, turgor normal. No rashes or lesions Lymph nodes: Cervical, supraclavicular, and axillary nodes normal. Neurologic: Grossly normal   Studies/Results: Results for orders placed or performed during the hospital encounter of 04/14/19 (from the  past 48 hour(s))  Osmolality     Status: Abnormal   Collection Time: 04/15/19 12:42 AM  Result Value Ref Range   Osmolality 306 (H) 275 - 295 mOsm/kg    Comment: Performed at [facility], [address] [place], [place] [address]  Sedimentation rate     Status: Abnormal   Collection Time: 04/15/19 12:42 AM  Result Value Ref Range   Sed Rate 107 (H) 0 - 16 mm/hr    Comment: Performed at [facility], [address], [place], [place] [address]  Hemoglobin and hematocrit, blood     Status: Abnormal   Collection Time: 04/15/19 12:44 AM  Result Value Ref Range   Hemoglobin 7.7 (L) 13.0 - 17.0 g/dL   HCT 23.2 (L) 39.0 - 52.0 %    Comment: Performed at [facility], [address] [place], [place] [address]  Renal function panel     Status: Abnormal   Collection Time: 04/15/19 12:55 AM  Result Value Ref Range   Sodium 136 135 - 145 mmol/L   Potassium 4.9 3.5 - 5.1 mmol/L   Chloride 109 98 - 111 mmol/L   CO2 14 (L) 22 - 32 mmol/L   Glucose, Bld 103 (H) 70 - 99 mg/dL   BUN 82 (H) 8 - 23 mg/dL   Creatinine, Ser 10.18 (H) 0.61 - 1.24 mg/dL   Calcium 7.4 (L) 8.9 - 10.3 mg/dL   Phosphorus 6.4 (H) 2.5 - 4.6 mg/dL  Albumin 2.3 (L) 3.5 - 5.0 g/dL   GFR calc non Af Amer 5 (L) >60 mL/min   GFR calc Af Amer 5 (L) >60 mL/min   Anion gap 13 5 - 15    Comment: Performed at [facility], [address], [place], [place] [address]  Magnesium     Status: Abnormal   Collection Time: 04/15/19 12:55 AM  Result Value Ref Range   Magnesium 1.4 (L) 1.7 - 2.4 mg/dL    Comment: Performed at [facility], [address], [place], [place] [address]  CBC     Status: Abnormal   Collection Time: 04/15/19  8:54 AM  Result Value Ref Range   WBC 11.2 (H) 4.0 - 10.5 K/uL   RBC 2.94 (L) 4.22 - 5.81 MIL/uL   Hemoglobin 8.9 (L) 13.0 - 17.0 g/dL   HCT 26.1 (L) 39.0 - 52.0 %   MCV 88.8 80.0 - 100.0 fL   MCH 30.3 26.0 - 34.0 pg   MCHC 34.1 30.0 - 36.0 g/dL   RDW 15.4 11.5 - 15.5 %   Platelets 38 (L) 150 -  400 K/uL    Comment: PLATELET COUNT CONFIRMED BY SMEAR SPECIMEN CHECKED FOR CLOTS Immature Platelet Fraction may be clinically indicated, consider ordering this additional test [id number]    nRBC 0.0 0.0 - 0.2 %    Comment: Performed at [facility], [address] [place], [place] [address]  Urinalysis, Routine w reflex microscopic     Status: Abnormal   Collection Time: 04/15/19  9:13 AM  Result Value Ref Range   Color, Urine STRAW (A) YELLOW   APPearance CLEAR CLEAR   Specific Gravity, Urine 1.010 1.005 - 1.030   pH 7.0 5.0 - 8.0   Glucose, UA 50 (A) NEGATIVE mg/dL   Hgb urine dipstick MODERATE (A) NEGATIVE   Bilirubin Urine NEGATIVE NEGATIVE   Ketones, ur NEGATIVE NEGATIVE mg/dL   Protein, ur >=300 (A) NEGATIVE mg/dL   Nitrite NEGATIVE NEGATIVE   Leukocytes,Ua NEGATIVE NEGATIVE   RBC / HPF >50 (H) 0 - 5 RBC/hpf   WBC, UA 0-5 0 - 5 WBC/hpf   Bacteria, UA NONE SEEN NONE SEEN   Squamous Epithelial / LPF 0-5 0 - 5   Mucus PRESENT    Hyaline Casts, UA PRESENT    Uric Acid Crys, UA PRESENT     Comment: Performed at [facility], [address] [place], [place] [address]  Sodium, urine, random     Status: None   Collection Time: 04/15/19  9:13 AM  Result Value Ref Range   Sodium, Ur 72 mmol/L    Comment: Performed at [facility], [address], [place], [place] [address]  Protein / creatinine ratio, urine     Status: None   Collection Time: 04/15/19  9:16 AM  Result Value Ref Range   Creatinine, Urine 50.58 mg/dL   Total Protein, Urine 737 mg/dL    Comment: RESULTS CONFIRMED BY MANUAL DILUTION   Protein Creatinine Ratio        0.00 - 0.15 mg/mg[Cre]    Comment: RESULT BELOW REPORTABLE RANGE, UNABLE TO CALCULATE. Performed at [facility], [address], [place], [place] [address]   Renal function panel     Status: Abnormal   Collection Time: 04/16/19  4:42 AM  Result Value Ref Range   Sodium 135 135 - 145 mmol/L   Potassium 3.8 3.5 - 5.1 mmol/L    Comment: DELTA CHECK  NOTED   Chloride 106 98 -  111 mmol/L   CO2 15 (L) 22 - 32 mmol/L   Glucose, Bld 105 (H) 70 - 99 mg/dL   BUN 79 (H) 8 - 23 mg/dL   Creatinine, Ser 10.73 (H) 0.61 - 1.24 mg/dL   Calcium 7.9 (L) 8.9 - 10.3 mg/dL   Phosphorus 7.1 (H) 2.5 - 4.6 mg/dL   Albumin 2.3 (L) 3.5 - 5.0 g/dL   GFR calc non Af Amer 4 (L) >60 mL/min   GFR calc Af Amer 5 (L) >60 mL/min   Anion gap 14 5 - 15    Comment: Performed at [facility], [address] [place], [place] [address]  Magnesium     Status: None   Collection Time: 04/16/19  4:42 AM  Result Value Ref Range   Magnesium 1.7 1.7 - 2.4 mg/dL    Comment: Performed at [facility], [address] [place], [place] [address]  CBC     Status: Abnormal   Collection Time: 04/16/19  4:42 AM  Result Value Ref Range   WBC 11.4 (H) 4.0 - 10.5 K/uL   RBC 2.74 (L) 4.22 - 5.81 MIL/uL   Hemoglobin 8.2 (L) 13.0 - 17.0 g/dL   HCT 24.2 (L) 39.0 - 52.0 %   MCV 88.3 80.0 - 100.0 fL   MCH 29.9 26.0 - 34.0 pg   MCHC 33.9 30.0 - 36.0 g/dL   RDW 15.0 11.5 - 15.5 %   Platelets 45 (L) 150 - 400 K/uL    Comment: REPEATED TO VERIFY SPECIMEN CHECKED FOR CLOTS Immature Platelet Fraction may be clinically indicated, consider ordering this additional test [id number] CONSISTENT WITH PREVIOUS RESULT    nRBC 0.0 0.0 - 0.2 %    Comment: Performed at [facility], [address], [place], [place] [address]   US Renal  Result Date: 04/15/2019 CLINICAL DATA:  Acute renal disease.  Chronic renal disease. EXAM: RENAL / URINARY TRACT ULTRASOUND COMPLETE COMPARISON:  PET-CT 01/22/2019. FINDINGS: Right Kidney: Renal measurements: 12.8 x 6.0 x 6.1 cm = volume: 242.3 mL. Increased echogenicity. 8.8 cm simple cyst. 3.6 cm simple cyst. No hydronephrosis visualized. Left Kidney: Renal measurements: 12.9 x 6.1 x 5.3 cm = volume: 219.5 mL. Increased echogenicity. No mass or hydronephrosis visualized. Bladder: Appears normal for degree of bladder distention. IMPRESSION: Increased echogenicity both  kidneys. This consistent chronic medical renal disease. Two simple cyst right kidney. No evidence of hydronephrosis or bladder distention. No acute abnormality identified. Electronically Signed   By: Marcello Moores  Register   On: 04/15/2019 11:33     MEDICATIONS: I have reviewed the patient's current medications.     Assessment/Plan:  1.  Limited stage small cell lung cancer: - Diagnosed in March 2020, started on chemotherapy with carboplatin and etoposide on 02/17/2019, delivered under the direction of Dr. Earlie Server. -Radiation started on 03/17/2019 with cycle 3 of chemotherapy on 03/31/2019. -CT scan after 2 cycles showed near complete resolution of the mass anterior to the descending thoracic aorta. - Platelet count improved to 45.  White count is 11.4, likely from Neulasta.  Hemoglobin is 8.2 after 2 units of blood transfusion. -Dr. Earlie Server was notified of his admission.  2.  Nonoliguric acute renal failure: - He has CKD with baseline creatinine around 2.02.5. - His creatinine today is at 10.73. - Ultrasound of the kidneys on 04/15/2019 shows increased echogenicity of both kidneys consistent with chronic medical renal disease.  No hydronephrosis noted. - Further work-up as per Dr. Carolin Sicks. - He is receiving IV sodium bicarbonate for  metabolic acidosis.  All questions were answered. The patient knows to call the clinic with any problems, questions or concerns. We can certainly see the patient much sooner if necessary.     Derek Jack

## 2019-04-16 NOTE — Progress Notes (Signed)
Patient remains hospitalized at [facility]. Radiation on hold again today. Melanie, RT on L2 informed of this finding.

## 2019-04-16 NOTE — Progress Notes (Signed)
Bladder scan performed.  Patient had approximately 230 cc of urine in bladder.

## 2019-04-16 NOTE — Progress Notes (Addendum)
[facility] NEPHROLOGY PROGRESS NOTE  Assessment/ Plan: Pt is a 73 y.o. yo male with history of HTN, HLD, AAA s/p repair in 2006 causing damage to the kidney and then CKD with baseline creatinine around 2.5 per patient, recently dx with SCC of lung s/p 3 cycles of carboplatin and etoposide and RT, last chemo on 5/18,  sent to the hospital for the evaluation of abnormal lab including hemoglobin 6.9 and serum creatinine level 9.86.    #Acute kidney injury on CKD likely due to combination of carboplatin in association with losartan/HCTZ and anemia: Other differential could be glomerulonephritis due to Neulasta.  Urinalysis with proteinuria and hematuria. -the serum cr level 10.73 today with urine output 2500 cc/24 hrs.  -check 24 hours urine protein. -check serologies including c3, c4, ANCA, ANA, antiGBM ab, hep B, C, HIV, K/l ratio. -change IVF to D5+sod bicarb 150 meq. -US renal with increased echogenicity consistent with CKD. He will probably need kidney biopsy for the definitive diagnosis. He has high risk of bleeding due to thrombocytopenia at this time.  -He is non-oliguric and has no sign of uremia. No need for dialysis today, hopefully the kidney function will recover gradually.  I have discussed this with the patient at length today.  Continue to hold losartan/HCTZ. Hold allopurinol.  #Metabolic acidosis due to renal failure: Sodium bicarbonate IV as above.  #Hypomagnesemia: Due to carboplatin.  Repleted magnesium sulfate.  Monitor lab.  #Hypertension: Monitor BP. Continue metoprolol.  #Anemia due to chemotherapy: Received 2 units of PRBC transfusion on 04/14/2019.  Monitor CBC.  #Thrombocytopenia: Due to chemo.  No sign of bleeding.  #Small cell lung cancer: Receiving chemotherapy.  Per oncology.  Subjective: Patient was seen and examined at bedside.  Patient reported feeling good and denies any complaint.  No nausea, vomiting, weakness, chest pain, shortness of  breath.  Good urine output. Objective Vital signs in last 24 hours: Vitals:   04/15/19 0916 04/15/19 1316 04/15/19 2053 04/16/19 0519  BP: 135/81 128/68 (!) 146/77 (!) 150/80  Pulse: 97 81 92 97  Resp:  16 20 20   Temp:  97.8 F (36.6 C) 98.6 F (37 C) 99.2 F (37.3 C)  TempSrc:  Oral Oral Oral  SpO2:  97% 94% 96%  Weight:    93.1 kg  Height:       Weight change: -1.27 kg  Intake/Output Summary (Last 24 hours) at 04/16/2019 0914 Last data filed at 04/16/2019 0500 Gross per 24 hour  Intake 923.79 ml  Output 2500 ml  Net -1576.21 ml       Labs: Basic Metabolic Panel: Recent Labs  Lab 04/14/19 1247 04/15/19 0055 04/16/19 0442  NA 133* 136 135  K 4.4 4.9 3.8  CL 103 109 106  CO2 16* 14* 15*  GLUCOSE 113* 103* 105*  BUN 82* 82* 79*  CREATININE 9.86* 10.18* 10.73*  CALCIUM 7.8* 7.4* 7.9*  PHOS  --  6.4* 7.1*   Liver Function Tests: Recent Labs  Lab 04/14/19 0959 04/14/19 1247 04/15/19 0055 04/16/19 0442  AST 11* 13*  --   --   ALT 9 12  --   --   ALKPHOS 97 90  --   --   BILITOT 0.3 0.3  --   --   PROT 5.9* 6.3*  --   --   ALBUMIN 2.4* 2.7* 2.3* 2.3*   No results for input(s): LIPASE, AMYLASE in the last 168 hours. No results for input(s): AMMONIA in the last 168 hours.  CBC: Recent Labs  Lab 04/14/19 0959  04/14/19 1247 04/15/19 0044 04/15/19 0854 04/16/19 0442  WBC 11.5*  --  12.2*  --  11.2* 11.4*  NEUTROABS 8.6*  --  8.9*  --   --   --   HGB 6.8*   < > 6.9* 7.7* 8.9* 8.2*  HCT 20.3*  --  19.9* 23.2* 26.1* 24.2*  MCV 92.7  --  91.7  --  88.8 88.3  PLT 34*  --  32*  --  38* 45*   < > = values in this interval not displayed.   Cardiac Enzymes: No results for input(s): CKTOTAL, CKMB, CKMBINDEX, TROPONINI in the last 168 hours. CBG: No results for input(s): GLUCAP in the last 168 hours.  Iron Studies: No results for input(s): IRON, TIBC, TRANSFERRIN, FERRITIN in the last 72 hours. Studies/Results: Dg Chest 2 View  Result Date:  04/14/2019 CLINICAL DATA:  Anemia.  History of lung cancer. EXAM: CHEST - 2 VIEW COMPARISON:  Chest x-ray 01/24/2019 and chest CT 03/24/2019 FINDINGS: The cardiac silhouette, mediastinal and hilar contours are within normal limits and stable. There is mild tortuosity of the thoracic aorta. Mild chronic emphysematous changes with areas of pulmonary scarring. No infiltrates, edema or effusions. No worrisome pulmonary lesions. IMPRESSION: Chronic emphysematous changes and pulmonary scarring but no definite acute overlying pulmonary process or new pulmonary lesions. Electronically Signed   By: Marijo Sanes M.D.   On: 04/14/2019 13:20   US Renal  Result Date: 04/15/2019 CLINICAL DATA:  Acute renal disease.  Chronic renal disease. EXAM: RENAL / URINARY TRACT ULTRASOUND COMPLETE COMPARISON:  PET-CT 01/22/2019. FINDINGS: Right Kidney: Renal measurements: 12.8 x 6.0 x 6.1 cm = volume: 242.3 mL. Increased echogenicity. 8.8 cm simple cyst. 3.6 cm simple cyst. No hydronephrosis visualized. Left Kidney: Renal measurements: 12.9 x 6.1 x 5.3 cm = volume: 219.5 mL. Increased echogenicity. No mass or hydronephrosis visualized. Bladder: Appears normal for degree of bladder distention. IMPRESSION: Increased echogenicity both kidneys. This consistent chronic medical renal disease. Two simple cyst right kidney. No evidence of hydronephrosis or bladder distention. No acute abnormality identified. Electronically Signed   By: Marcello Moores  Register   On: 04/15/2019 11:33    Medications: Infusions: .  sodium bicarbonate  infusion 1000 mL      Scheduled Medications: . calcitRIOL  0.25 mcg Oral Daily  . levothyroxine  175 mcg Oral QAC breakfast  . metoprolol tartrate  12.5 mg Oral BID  . pantoprazole  40 mg Oral Daily  . simvastatin  10 mg Oral q1800  . sucralfate  1 g Oral TID WC & HS    have reviewed scheduled and prn medications.  Physical Exam: General:NAD, comfortable Heart:RRR, s1s2 nl Lungs:clear b/l, no crackle,  wheezing Abdomen:soft, Non-tender, non-distended Extremities:No edema Neurology: Alert, awake, nonfocal, no asterixis Skin: No rash or ulcer  Jeremy Johnson Jeremy Johnson Jeremy Johnson 04/16/2019,9:14 AM  LOS: 2 days

## 2019-04-17 ENCOUNTER — Inpatient Hospital Stay (HOSPITAL_COMMUNITY): Payer: Medicare Other

## 2019-04-17 ENCOUNTER — Ambulatory Visit: Payer: Medicare Other

## 2019-04-17 LAB — CBC
HCT: 21.3 % — ABNORMAL LOW (ref 39.0–52.0)
Hemoglobin: 7.5 g/dL — ABNORMAL LOW (ref 13.0–17.0)
MCH: 30.5 pg (ref 26.0–34.0)
MCHC: 35.2 g/dL (ref 30.0–36.0)
MCV: 86.6 fL (ref 80.0–100.0)
Platelets: 57 10*3/uL — ABNORMAL LOW (ref 150–400)
RBC: 2.46 MIL/uL — ABNORMAL LOW (ref 4.22–5.81)
RDW: 15.2 % (ref 11.5–15.5)
WBC: 10.6 10*3/uL — ABNORMAL HIGH (ref 4.0–10.5)
nRBC: 0 % (ref 0.0–0.2)

## 2019-04-17 LAB — PROTEIN, URINE, 24 HOUR
Collection Interval-UPROT: 24 hours
Protein, 24H Urine: 11550 mg/d — ABNORMAL HIGH (ref 50–100)
Protein, Urine: 550 mg/dL
Urine Total Volume-UPROT: 2100 mL

## 2019-04-17 LAB — RENAL FUNCTION PANEL
Albumin: 2 g/dL — ABNORMAL LOW (ref 3.5–5.0)
Anion gap: 13 (ref 5–15)
BUN: 73 mg/dL — ABNORMAL HIGH (ref 8–23)
CO2: 23 mmol/L (ref 22–32)
Calcium: 7.4 mg/dL — ABNORMAL LOW (ref 8.9–10.3)
Chloride: 99 mmol/L (ref 98–111)
Creatinine, Ser: 11.7 mg/dL — ABNORMAL HIGH (ref 0.61–1.24)
GFR calc Af Amer: 4 mL/min — ABNORMAL LOW (ref >60)
GFR calc non Af Amer: 4 mL/min — ABNORMAL LOW (ref >60)
Glucose, Bld: 124 mg/dL — ABNORMAL HIGH (ref 70–99)
Phosphorus: 7.2 mg/dL — ABNORMAL HIGH (ref 2.5–4.6)
Potassium: 3.4 mmol/L — ABNORMAL LOW (ref 3.5–5.1)
Sodium: 135 mmol/L (ref 135–145)

## 2019-04-17 LAB — MAGNESIUM: Magnesium: 1.5 mg/dL — ABNORMAL LOW (ref 1.7–2.4)

## 2019-04-17 LAB — PROTIME-INR
INR: 1.2 (ref 0.8–1.2)
Prothrombin Time: 14.6 seconds (ref 11.4–15.2)

## 2019-04-17 LAB — LACTATE DEHYDROGENASE: LDH: 199 U/L — ABNORMAL HIGH (ref 98–192)

## 2019-04-17 LAB — HEPATITIS C ANTIBODY: HCV Ab: <0.1 s/co ratio (ref 0.0–0.9)

## 2019-04-17 LAB — ANA W/REFLEX IF POSITIVE: Anti Nuclear Antibody (ANA): NEGATIVE

## 2019-04-17 LAB — C3 COMPLEMENT: C3 Complement: 122 mg/dL (ref 82–167)

## 2019-04-17 LAB — KAPPA/LAMBDA LIGHT CHAINS
Kappa free light chain: 119.8 mg/L — ABNORMAL HIGH (ref 3.3–19.4)
Kappa, lambda light chain ratio: 1.45 (ref 0.26–1.65)
Lambda free light chains: 82.9 mg/L — ABNORMAL HIGH (ref 5.7–26.3)

## 2019-04-17 LAB — HEPATITIS B SURFACE ANTIGEN: Hepatitis B Surface Ag: NEGATIVE

## 2019-04-17 LAB — GLOMERULAR BASEMENT MEMBRANE ANTIBODIES: GBM Ab: 2 units (ref 0–20)

## 2019-04-17 LAB — HIV ANTIBODY (ROUTINE TESTING W REFLEX): HIV Screen 4th Generation wRfx: NONREACTIVE

## 2019-04-17 LAB — C4 COMPLEMENT: Complement C4, Body Fluid: 31 mg/dL (ref 14–44)

## 2019-04-17 MED ORDER — ONDANSETRON HCL 4 MG/2ML IJ SOLN
4.0000 mg | Freq: Four times a day (QID) | INTRAMUSCULAR | Status: DC | PRN
  Administered 2019-04-17 – 2019-04-20 (×2): 4 mg via INTRAVENOUS
  Filled 2019-04-17 (×2): qty 2

## 2019-04-17 MED ORDER — CALCIUM ACETATE (PHOS BINDER) 667 MG PO CAPS
667.0000 mg | ORAL_CAPSULE | Freq: Three times a day (TID) | ORAL | Status: DC
  Administered 2019-04-17 – 2019-04-21 (×11): 667 mg via ORAL
  Filled 2019-04-17 (×12): qty 1

## 2019-04-17 MED ORDER — BARIUM SULFATE 2.1 % PO SUSP
ORAL | Status: AC
  Filled 2019-04-17: qty 2

## 2019-04-17 MED ORDER — MAGNESIUM SULFATE 2 GM/50ML IV SOLN
2.0000 g | Freq: Once | INTRAVENOUS | Status: AC
  Administered 2019-04-17: 2 g via INTRAVENOUS
  Filled 2019-04-17: qty 50

## 2019-04-17 MED ORDER — POTASSIUM CHLORIDE CRYS ER 20 MEQ PO TBCR
40.0000 meq | EXTENDED_RELEASE_TABLET | Freq: Once | ORAL | Status: AC
  Administered 2019-04-17: 40 meq via ORAL
  Filled 2019-04-17: qty 2

## 2019-04-17 MED ORDER — SODIUM CHLORIDE 0.9 % IV SOLN
INTRAVENOUS | Status: DC
  Administered 2019-04-17 – 2019-04-19 (×4): via INTRAVENOUS

## 2019-04-17 NOTE — Progress Notes (Signed)
Patient with small cell lung cancer currently undergoing chemoradiation treatment. He was admitted to [facility] 04/14/19 due to anemia with HgB of 6.9 and elevated SCr.  Unfortunately his SCr continues to rise. IR consulted for renal biopsy at the request of Dr. Carolin Sicks.  Patient also with myelosuppression likely from chemotherapy.  S/p 2u PRBCs. His platelet count decreased to 38K and has slowly improved.  Discussed with Dr. Annamaria Boots. Patient approved for kidney biopsy once platelet count >100K.  Per Dr. Carles Collet note from today, consideration of transfer to [facility] for possible need of HD.   Plan to proceed with biopsy as able pending lab trends.  INR 1.2  Brynda Greathouse, MS RD PA-C 2:38 PM

## 2019-04-17 NOTE — Progress Notes (Signed)
Patient transferred to [facility] via [facility]. VSS. IV intact, IVF infusing. Tele removed. Report called to Shela Commons, RN.

## 2019-04-17 NOTE — Progress Notes (Addendum)
[facility] NEPHROLOGY PROGRESS NOTE  Assessment/ Plan: Pt is a 73 y.o. yo male with history of HTN, HLD, AAA s/p repair in 2006 causing damage to the kidney and then CKD with baseline creatinine around 2.5 per patient, recently dx with SCC of lung s/p 3 cycles of carboplatin and etoposide and RT, last chemo on 5/18,  sent to the hospital for the evaluation of abnormal lab including hemoglobin 6.9 and serum creatinine level 9.86.    #Acute kidney injury on CKD likely ATN due to carboplatin, losartan/HCTZ and anemia: Other differential could be glomerulonephritis due to Neulasta vs membranous GN due to cancer vs rare possibility of chemo induced TMA. Urinalysis with proteinuria and hematuria. -Patient is asymptomatic and has good urine output despite of worsening serum creatinine level to 11.7 today. Platelet counts improving. -F/u 24 hours urine protein and serolgies including c3, c4, ANCA, ANA, antiGBM ab, hep B, C, HIV, K/l ratio. -add haptoglobin, INR and LDH. No mention of schistocytes in CBC differential. -since patient reports occasional clots in the urine, he may benefit from urology evaluation. -acidosis improved therefore discontinue sodium bicarbonate and changed to NS.   -US renal with increased echogenicity consistent with CKD. He will probably need kidney biopsy for the definitive diagnosis. Will wait pending lab results and monitor platelet counts. -He has no sign of uremia. No need for dialysis today, hopefully the kidney function will start improving.  I have discussed above with the patient at length today.  Continue to hold losartan/HCTZ. Hold allopurinol.  #Metabolic acidosis due to renal failure: Improved.  #Hypomagnesemia: Due to carboplatin.  Replete magnesium sulfate.  Monitor lab.  # Hypokalemia: replete KCL.  #Hypertension: Monitor BP. Continue metoprolol.  #Anemia due to chemotherapy: Received 2 units of PRBC transfusion on 04/14/2019.  Monitor  CBC.  #Thrombocytopenia: Due to chemo.  No sign of bleeding.   #Small cell lung cancer: On chemotherapy.  Per oncology.  Addendum:  24 hour urine protein: 11,550  Complements normal.  He has nephrotic syndrome. I think we should transfer him to [facility] for possible kidney biopsy (if platelets continue to improve) and daily eval for dialysis. D/w Dr. Carles Collet. I also informed patient about the transfer.  Addendum: 12;46 PM Discussed benefit and risks of kidney biopsy with the patient. The risks including bleeding, infection, loss of kidney function etc discussed. Pt verbalized understanding and agreed. I have spoken with Dr. Annamaria Boots from IR. IR consult placed.  Subjective: Patient was seen and examined at bedside.  No new event.  Denies headache, dizziness, nausea, vomiting, chest pain, shortness of breath.  No weakness, no abdominal pain.  He feels good.  Reports occasional small clots during urination.  Objective Vital signs in last 24 hours: Vitals:   04/16/19 1449 04/16/19 1957 04/16/19 2107 04/17/19 0509  BP: 135/87  134/70 126/73  Pulse: 78  80 85  Resp: 16  20 20   Temp: 98.5 F (36.9 C)  98.6 F (37 C) 99.1 F (37.3 C)  TempSrc: Oral  Oral Oral  SpO2: 95% 97% 93% 94%  Weight:      Height:       Weight change:   Intake/Output Summary (Last 24 hours) at 04/17/2019 0836 Last data filed at 04/17/2019 0630 Gross per 24 hour  Intake 2533.22 ml  Output 1550 ml  Net 983.22 ml       Labs: Basic Metabolic Panel: Recent Labs  Lab 04/15/19 0055 04/16/19 0442 04/17/19 0424  NA 136 135 135  K  4.9 3.8 3.4*  CL 109 106 99  CO2 14* 15* 23  GLUCOSE 103* 105* 124*  BUN 82* 79* 73*  CREATININE 10.18* 10.73* 11.70*  CALCIUM 7.4* 7.9* 7.4*  PHOS 6.4* 7.1* 7.2*   Liver Function Tests: Recent Labs  Lab 04/14/19 0959 04/14/19 1247 04/15/19 0055 04/16/19 0442 04/17/19 0424  AST 11* 13*  --   --   --   ALT 9 12  --   --   --   ALKPHOS 97 90  --   --   --   BILITOT 0.3 0.3  --    --   --   PROT 5.9* 6.3*  --   --   --   ALBUMIN 2.4* 2.7* 2.3* 2.3* 2.0*   No results for input(s): LIPASE, AMYLASE in the last 168 hours. No results for input(s): AMMONIA in the last 168 hours. CBC: Recent Labs  Lab 04/14/19 0959  04/14/19 1247  04/15/19 0854 04/16/19 0442 04/17/19 0424  WBC 11.5*   < > 12.2*  --  11.2* 11.4* 10.6*  NEUTROABS 8.6*  --  8.9*  --   --   --   --   HGB 6.8*  --  6.9*   < > 8.9* 8.2* 7.5*  HCT 20.3*  --  19.9*   < > 26.1* 24.2* 21.3*  MCV 92.7  --  91.7  --  88.8 88.3 86.6  PLT 34*   < > 32*  --  38* 45* 57*   < > = values in this interval not displayed.   Cardiac Enzymes: No results for input(s): CKTOTAL, CKMB, CKMBINDEX, TROPONINI in the last 168 hours. CBG: No results for input(s): GLUCAP in the last 168 hours.  Iron Studies: No results for input(s): IRON, TIBC, TRANSFERRIN, FERRITIN in the last 72 hours. Studies/Results: US Renal  Result Date: 04/15/2019 CLINICAL DATA:  Acute renal disease.  Chronic renal disease. EXAM: RENAL / URINARY TRACT ULTRASOUND COMPLETE COMPARISON:  PET-CT 01/22/2019. FINDINGS: Right Kidney: Renal measurements: 12.8 x 6.0 x 6.1 cm = volume: 242.3 mL. Increased echogenicity. 8.8 cm simple cyst. 3.6 cm simple cyst. No hydronephrosis visualized. Left Kidney: Renal measurements: 12.9 x 6.1 x 5.3 cm = volume: 219.5 mL. Increased echogenicity. No mass or hydronephrosis visualized. Bladder: Appears normal for degree of bladder distention. IMPRESSION: Increased echogenicity both kidneys. This consistent chronic medical renal disease. Two simple cyst right kidney. No evidence of hydronephrosis or bladder distention. No acute abnormality identified. Electronically Signed   By: Marcello Moores  Register   On: 04/15/2019 11:33    Medications: Infusions: . sodium chloride      Scheduled Medications: . calcitRIOL  0.25 mcg Oral Daily  . calcium acetate  667 mg Oral TID WC  . levothyroxine  175 mcg Oral QAC breakfast  . metoprolol tartrate   12.5 mg Oral BID  . pantoprazole  40 mg Oral Daily  . potassium chloride  40 mEq Oral Once  . simvastatin  10 mg Oral q1800  . sucralfate  1 g Oral TID WC & HS    have reviewed scheduled and prn medications.  Physical Exam: General: Not in distress, comfortable Heart:RRR, s1s2 nl no rubs Lungs: Bilateral, no wheezing or crackle Abdomen:soft, Non-tender, non-distended Extremities: No edema Neurology: Alert, awake, nonfocal, no asterixis Skin: No rash or ulcer   Prasad  04/17/2019,8:36 AM  LOS: 3 days

## 2019-04-17 NOTE — Progress Notes (Addendum)
PROGRESS NOTE  MAKAVELI HOARD XIP:[id number] DOB: [birth year] DOA: 04/14/2019 PCP: [name], MD  Brief History:  73 year old male with a history of limited stage small cell lung cancer, hypertension, anemia of CKD, CKD stage III, hyperlipidemia, hypothyroidism, coronary artery disease presenting secondary to abnormal labs.  Patient was contacted by his medical oncologist, Dr. Earlie Server secondary to elevated serum creatinine on routine blood work of 9.52.  In addition, the patient was noted to have hemoglobin of 6.9.  Patient himself was feeling pretty well except for some mild generalized weakness.  He had denied any fevers, chills, chest pain, shortness breath, coughing, hemoptysis, nausea, vomiting, diarrhea, abdominal pain, dysuria, hematuria.  The patient denies any new medications or NSAIDs.  Upon presentation, the patient was noted to have serum creatinine of 9.52 and hemoglobin 6.9.  The patient was transfused with 2 units PRBC.  Nephrology was consulted to assist with management.  Assessment/Plan: Acute on chronic renal failure--CKD stage III -Baseline creatinine 2.2-2.4 -Nephrology consult appreciated -24-hour urine initiated -Work-up in progress -Renal ultrasound neg for hydronephrosis; suggests medical renal disease -ESR 107 -may be due to carboplatin in setting of losartan/HCTZ which have been held -last chemo 5/18 -complements unremarkable -6/4--case discussed with renal--Dr. Ardis Rowan to [facility] for possible renal bx and initiation of HD  Metabolic acidosis -Continue bicarbonate drip per nephrology  Anemia of CKD and chemotherapy -Check FOBT -Transfused 2 units PRBC this admit  Essential hypertension -Holding losartan/HCTZ and amlodipine -Continue metoprolol tartrate  Limited stage small cell lung cancer -Initially diagnosed in March 2020 -Last chemotherapy 03/31/2019--cycle 3 -Last XRT 03/17/2019 -Follow-up with Dr. Earlie Server after  discharge -Received 2 units PRBC this admission  Thrombocytopenia -Secondary to chemotherapy -starting to recover -may be contributing to hematuria -A.m. CBC  Hypothyroidism -continue synthroid  Coronary Artery Disease -ASA on hold due to thrombocytopenia -continue metoprolol and statin -no chest pain presently  Hypomagnesemia--replete  Hematuria -urinating blood clots -able to void without difficulty presently -6/4-discussed with urology, Dr. Eleonore Chiquito CT abd/pelvis and can follow up as outpt for cystoscopy unless CT abd is concerning     Disposition Plan:   Transfer to [facility]  Family Communication:   No Family at bedside  Consultants:  Renal/ med/onc  Code Status:  FULL   DVT Prophylaxis:  SCDs   Procedures: As Listed in Progress Note Above  Antibiotics: None   Total time spent 35 minutes.  Greater than 50% spent face to face counseling and coordinating care.    Subjective: Patient denies fevers, chills, headache, chest pain, dyspnea, nausea, vomiting, diarrhea, abdominal pain, dysuria, hematuria, hematochezia, and melena.   Objective: Vitals:   04/16/19 1449 04/16/19 1957 04/16/19 2107 04/17/19 0509  BP: 135/87  134/70 126/73  Pulse: 78  80 85  Resp: _0 Temp: 98.5 F (36.9 C)  98.6 F (37 C) 99.1 F (37.3 C)  TempSrc: Oral  Oral Oral  SpO2: 95% 97% 93% 94%  Weight:      Height:        Intake/Output Summary (Last 24 hours) at 04/17/2019 0902 Last data filed at 04/17/2019 0630 Gross per 24 hour  Intake 2533.22 ml  Output 1550 ml  Net 983.22 ml   Weight change:  Exam:   General:  Pt is alert, follows commands appropriately, not in acute distress  HEENT: No icterus, No thrush, No neck mass, [place]/AT  Cardiovascular: RRR, S1/S2, no rubs, no gallops  Respiratory: fine  bibasilar crackles, no wheeze  Abdomen: Soft/+BS, non tender, non distended, no guarding  Extremities: trace LE edema, No lymphangitis, No  petechiae, No rashes, no synovitis   Data Reviewed: I have personally reviewed following labs and imaging studies Basic Metabolic Panel: Recent Labs  Lab 04/14/19 0959 04/14/19 1247 04/15/19 0055 04/16/19 0442 04/17/19 0424  NA 134* 133* 136 135 135  K 4.4 4.4 4.9 3.8 3.4*  CL 106 103 109 106 99  CO2 18* 16* 14* 15* 23  GLUCOSE 152* 113* 103* 105* 124*  BUN 80* 82* 82* 79* 73*  CREATININE 9.52* 9.86* 10.18* 10.73* 11.70*  CALCIUM 7.7* 7.8* 7.4* 7.9* 7.4*  MG  --   --  1.4* 1.7 1.5*  PHOS  --   --  6.4* 7.1* 7.2*   Liver Function Tests: Recent Labs  Lab 04/14/19 0959 04/14/19 1247 04/15/19 0055 04/16/19 0442 04/17/19 0424  AST 11* 13*  --   --   --   ALT 9 12  --   --   --   ALKPHOS 97 90  --   --   --   BILITOT 0.3 0.3  --   --   --   PROT 5.9* 6.3*  --   --   --   ALBUMIN 2.4* 2.7* 2.3* 2.3* 2.0*   No results for input(s): LIPASE, AMYLASE in the last 168 hours. No results for input(s): AMMONIA in the last 168 hours. Coagulation Profile: No results for input(s): INR, PROTIME in the last 168 hours. CBC: Recent Labs  Lab 04/14/19 0959 04/14/19 1247 04/15/19 0044 04/15/19 0854 04/16/19 0442 04/17/19 0424  WBC 11.5* 12.2*  --  11.2* 11.4* 10.6*  NEUTROABS 8.6* 8.9*  --   --   --   --   HGB 6.8* 6.9* 7.7* 8.9* 8.2* 7.5*  HCT 20.3* 19.9* 23.2* 26.1* 24.2* 21.3*  MCV 92.7 91.7  --  88.8 88.3 86.6  PLT 34* 32*  --  38* 45* 57*   Cardiac Enzymes: No results for input(s): CKTOTAL, CKMB, CKMBINDEX, TROPONINI in the last 168 hours. BNP: Invalid input(s): POCBNP CBG: No results for input(s): GLUCAP in the last 168 hours. HbA1C: No results for input(s): HGBA1C in the last 72 hours. Urine analysis:    Component Value Date/Time   COLORURINE STRAW (A) 04/15/2019 0913   APPEARANCEUR CLEAR 04/15/2019 0913   LABSPEC 1.010 04/15/2019 0913   PHURINE 7.0 04/15/2019 0913   GLUCOSEU 50 (A) 04/15/2019 0913   HGBUR MODERATE (A) 04/15/2019 0913   BILIRUBINUR NEGATIVE  04/15/2019 0913   KETONESUR NEGATIVE 04/15/2019 0913   PROTEINUR >=300 (A) 04/15/2019 0913   NITRITE NEGATIVE 04/15/2019 0913   LEUKOCYTESUR NEGATIVE 04/15/2019 0913   Sepsis Labs: _0 (procalcitonin:4,lacticidven:4) ) Recent Results (from the past 240 hour(s))  SARS Coronavirus 2 (CEPHEID - Performed in [facility] hospital lab), Hosp Order     Status: None   Collection Time: 04/14/19  2:13 PM  Result Value Ref Range Status   SARS Coronavirus 2 NEGATIVE NEGATIVE Final    Comment: (NOTE) If result is NEGATIVE SARS-CoV-2 target nucleic acids are NOT DETECTED. The SARS-CoV-2 RNA is generally detectable in upper and lower  respiratory specimens during the acute phase of infection. The lowest  concentration of SARS-CoV-2 viral copies this assay can detect is 250  copies / mL. A negative result does not preclude SARS-CoV-2 infection  and should not be used as the sole basis for treatment or other  patient management decisions.  A negative  result may occur with  improper specimen collection / handling, submission of specimen other  than nasopharyngeal swab, presence of viral mutation(s) within the  areas targeted by this assay, and inadequate number of viral copies  (<250 copies / mL). A negative result must be combined with clinical  observations, patient history, and epidemiological information. If result is POSITIVE SARS-CoV-2 target nucleic acids are DETECTED. The SARS-CoV-2 RNA is generally detectable in upper and lower  respiratory specimens dur ing the acute phase of infection.  Positive  results are indicative of active infection with SARS-CoV-2.  Clinical  correlation with patient history and other diagnostic information is  necessary to determine patient infection status.  Positive results do  not rule out bacterial infection or co-infection with other viruses. If result is PRESUMPTIVE POSTIVE SARS-CoV-2 nucleic acids MAY BE PRESENT.   A presumptive positive result  was obtained on the submitted specimen  and confirmed on repeat testing.  While 2019 novel coronavirus  (SARS-CoV-2) nucleic acids may be present in the submitted sample  additional confirmatory testing may be necessary for epidemiological  and / or clinical management purposes  to differentiate between  SARS-CoV-2 and other Sarbecovirus currently known to infect humans.  If clinically indicated additional testing with an alternate test  methodology [phone]) is advised. The SARS-CoV-2 RNA is generally  detectable in upper and lower respiratory sp ecimens during the acute  phase of infection. The expected result is Negative. Fact Sheet for Patients:  StrictlyIdeas.no Fact Sheet for Healthcare Providers: BankingDealers.co.za This test is not yet approved or cleared by the [place] FDA and has been authorized for detection and/or diagnosis of SARS-CoV-2 by FDA under an Emergency Use Authorization (EUA).  This EUA will remain in effect (meaning this test can be used) for the duration of the COVID-19 declaration under Section 564(b)(1) of the Act, 21 U.S.C. section 360bbb-3(b)(1), unless the authorization is terminated or revoked sooner. Performed at [facility], [address], [place], [place] [address]      Scheduled Meds: . calcitRIOL  0.25 mcg Oral Daily  . calcium acetate  667 mg Oral TID WC  . levothyroxine  175 mcg Oral QAC breakfast  . metoprolol tartrate  12.5 mg Oral BID  . pantoprazole  40 mg Oral Daily  . simvastatin  10 mg Oral q1800  . sucralfate  1 g Oral TID WC & HS   Continuous Infusions: . sodium chloride 75 mL/hr at 04/17/19 0853  . magnesium sulfate bolus IVPB 2 g (04/17/19 0853)    Procedures/Studies: Dg Chest 2 View  Result Date: 04/14/2019 CLINICAL DATA:  Anemia.  History of lung cancer. EXAM: CHEST - 2 VIEW COMPARISON:  Chest x-ray 01/24/2019 and chest CT 03/24/2019 FINDINGS: The cardiac silhouette,  mediastinal and hilar contours are within normal limits and stable. There is mild tortuosity of the thoracic aorta. Mild chronic emphysematous changes with areas of pulmonary scarring. No infiltrates, edema or effusions. No worrisome pulmonary lesions. IMPRESSION: Chronic emphysematous changes and pulmonary scarring but no definite acute overlying pulmonary process or new pulmonary lesions. Electronically Signed   By: Marijo Sanes M.D.   On: 04/14/2019 13:20   Ct Chest Wo Contrast  Result Date: 03/25/2019 CLINICAL DATA:  Limited stage small cell lung cancer EXAM: CT CHEST WITHOUT CONTRAST TECHNIQUE: Multidetector CT imaging of the chest was performed following the standard protocol without IV contrast. COMPARISON:  PET-CT dated 01/22/2019 FINDINGS: Cardiovascular: Heart is top-normal in size. No pericardial effusion. Stable focal aneurysmal dilatation anteriorly along  the anterior aspect of the transverse aortic arch (series 2/image 50), measuring up to 4.7 cm, grossly unchanged. Atherosclerotic calcifications of the abdominal aorta and branch vessels. Coronary atherosclerosis of the LAD and left circumflex. Mediastinum/Nodes: No suspicious mediastinal lymphadenopathy. Lungs/Pleura: Prior perifissural nodule/mass anterior to the descending thoracic aorta has essentially resolved (series 7/image 58). Minimal residual scarring/radiation changes. Otherwise, no suspicious pulmonary nodules. No focal consolidation. Moderate centrilobular and paraseptal emphysematous changes, upper lobe predominant. No pleural effusion or pneumothorax. Upper Abdomen: Visualized upper abdomen is grossly unremarkable, noting vascular calcifications. Musculoskeletal: Degenerative changes of the visualized thoracolumbar spine. IMPRESSION: Prior perifissural nodule/mass anterior to the descending thoracic aorta has essentially resolved. Mild residual scarring/radiation changes. No evidence of recurrent/metastatic disease. Aortic  Atherosclerosis (ICD10-I70.0) and Emphysema (ICD10-J43.9). Electronically Signed   By: Julian Hy M.D.   On: 03/25/2019 08:49   US Renal  Result Date: 04/15/2019 CLINICAL DATA:  Acute renal disease.  Chronic renal disease. EXAM: RENAL / URINARY TRACT ULTRASOUND COMPLETE COMPARISON:  PET-CT 01/22/2019. FINDINGS: Right Kidney: Renal measurements: 12.8 x 6.0 x 6.1 cm = volume: 242.3 mL. Increased echogenicity. 8.8 cm simple cyst. 3.6 cm simple cyst. No hydronephrosis visualized. Left Kidney: Renal measurements: 12.9 x 6.1 x 5.3 cm = volume: 219.5 mL. Increased echogenicity. No mass or hydronephrosis visualized. Bladder: Appears normal for degree of bladder distention. IMPRESSION: Increased echogenicity both kidneys. This consistent chronic medical renal disease. Two simple cyst right kidney. No evidence of hydronephrosis or bladder distention. No acute abnormality identified. Electronically Signed   By: Marcello Moores  Register   On: 04/15/2019 11:33    Orson Eva, DO  [facility] Pager [phone]  If 7PM-7AM, please contact night-coverage [email or web address] Password [email or web address] 04/17/2019, 9:02 AM   LOS: 3 days

## 2019-04-18 ENCOUNTER — Ambulatory Visit: Payer: Medicare Other

## 2019-04-18 LAB — CBC WITH DIFFERENTIAL/PLATELET
Abs Immature Granulocytes: 0.13 10*3/uL — ABNORMAL HIGH (ref 0.00–0.07)
Basophils Absolute: 0 10*3/uL (ref 0.0–0.1)
Basophils Relative: 0 %
Eosinophils Absolute: 0.1 10*3/uL (ref 0.0–0.5)
Eosinophils Relative: 1 %
HCT: 19.8 % — ABNORMAL LOW (ref 39.0–52.0)
Hemoglobin: 7.1 g/dL — ABNORMAL LOW (ref 13.0–17.0)
Immature Granulocytes: 1 %
Lymphocytes Relative: 11 %
Lymphs Abs: 1.2 10*3/uL (ref 0.7–4.0)
MCH: 30.7 pg (ref 26.0–34.0)
MCHC: 35.9 g/dL (ref 30.0–36.0)
MCV: 85.7 fL (ref 80.0–100.0)
Monocytes Absolute: 1.5 10*3/uL — ABNORMAL HIGH (ref 0.1–1.0)
Monocytes Relative: 14 %
Neutro Abs: 7.8 10*3/uL — ABNORMAL HIGH (ref 1.7–7.7)
Neutrophils Relative %: 73 %
Platelets: 72 10*3/uL — ABNORMAL LOW (ref 150–400)
RBC: 2.31 MIL/uL — ABNORMAL LOW (ref 4.22–5.81)
RDW: 14.6 % (ref 11.5–15.5)
WBC: 10.7 10*3/uL — ABNORMAL HIGH (ref 4.0–10.5)
nRBC: 0 % (ref 0.0–0.2)

## 2019-04-18 LAB — RENAL FUNCTION PANEL
Albumin: 1.8 g/dL — ABNORMAL LOW (ref 3.5–5.0)
Anion gap: 13 (ref 5–15)
BUN: 69 mg/dL — ABNORMAL HIGH (ref 8–23)
CO2: 21 mmol/L — ABNORMAL LOW (ref 22–32)
Calcium: 7.3 mg/dL — ABNORMAL LOW (ref 8.9–10.3)
Chloride: 101 mmol/L (ref 98–111)
Creatinine, Ser: 11.98 mg/dL — ABNORMAL HIGH (ref 0.61–1.24)
GFR calc Af Amer: 4 mL/min — ABNORMAL LOW (ref >60)
GFR calc non Af Amer: 4 mL/min — ABNORMAL LOW (ref >60)
Glucose, Bld: 105 mg/dL — ABNORMAL HIGH (ref 70–99)
Phosphorus: 7.5 mg/dL — ABNORMAL HIGH (ref 2.5–4.6)
Potassium: 3.4 mmol/L — ABNORMAL LOW (ref 3.5–5.1)
Sodium: 135 mmol/L (ref 135–145)

## 2019-04-18 LAB — ANCA TITERS
Atypical P-ANCA titer: <1:20 {titer}
C-ANCA: <1:20 {titer}
P-ANCA: <1:20 {titer}

## 2019-04-18 LAB — MPO/PR-3 (ANCA) ANTIBODIES
ANCA Proteinase 3: <3.5 U/mL (ref 0.0–3.5)
Myeloperoxidase Abs: <9 U/mL (ref 0.0–9.0)

## 2019-04-18 LAB — MAGNESIUM: Magnesium: 1.8 mg/dL (ref 1.7–2.4)

## 2019-04-18 LAB — HAPTOGLOBIN: Haptoglobin: 383 mg/dL — ABNORMAL HIGH (ref 34–355)

## 2019-04-18 LAB — LIPASE, BLOOD: Lipase: 22 U/L (ref 11–51)

## 2019-04-18 MED ORDER — SENNOSIDES-DOCUSATE SODIUM 8.6-50 MG PO TABS: 2.0000 | ORAL_TABLET | Freq: Every evening | ORAL | Status: DC | PRN

## 2019-04-18 MED ORDER — POLYETHYLENE GLYCOL 3350 17 G PO PACK: 17.0000 g | PACK | Freq: Every day | ORAL | Status: DC | PRN

## 2019-04-18 MED ORDER — POTASSIUM CHLORIDE CRYS ER 20 MEQ PO TBCR
40.0000 meq | EXTENDED_RELEASE_TABLET | Freq: Once | ORAL | Status: AC
  Administered 2019-04-18: 40 meq via ORAL
  Filled 2019-04-18: qty 2

## 2019-04-18 MED ORDER — FAMOTIDINE 20 MG PO TABS: 20.0000 mg | ORAL_TABLET | Freq: Two times a day (BID) | ORAL | Status: DC | PRN

## 2019-04-18 MED ORDER — HYDRALAZINE HCL 20 MG/ML IJ SOLN: 10.0000 mg | INTRAMUSCULAR | Status: DC | PRN

## 2019-04-18 MED ORDER — FAMOTIDINE IN NACL 20-0.9 MG/50ML-% IV SOLN
20.0000 mg | Freq: Once | INTRAVENOUS | Status: AC
  Administered 2019-04-18: 20 mg via INTRAVENOUS
  Filled 2019-04-18: qty 50

## 2019-04-18 NOTE — Progress Notes (Signed)
[facility] NEPHROLOGY PROGRESS NOTE  Assessment/ Plan: Pt is a 73 y.o. yo male with  HTN, HLD, AAA s/p repair in 2006 causing damage to the kidney and then CKD with baseline creatinine around 2.5 per patient, recently dx with SCC of lung s/p 3 cycles of carboplatin and etoposide and RT, last chemo on 5/18,  sent to the hospital for abnormal labs including hemoglobin 6.9 and serum creatinine level 9.86.    #Acute kidney injury on CKD - on 5/11 crt was 2.79, 5/18 3.37 then just worse from there.   likely ATN due to carboplatin, losartan/HCTZ and anemia: Other differential could be glomerulonephritis due to Neulasta vs membranous GN due to cancer vs rare possibility of chemo induced TMA. Urinalysis with proteinuria- over 11 grams and hematuria. -Patient is asymptomatic and had good urine output despite of worsening serum creatinine level to 11.7 today. Rate of rise is less.    serologies- c3, c4 WNL, ANCA pending , ANA negative , antiGBM ab negative , hep B, C, HIV all negative , K/l ratio is 1.45. -  Haptoglobin high , INR and LDH slightly high. No mention of schistocytes in CBC differential.  -acidosis improved therefore discontinue sodium bicarbonate and changed to NS.   -US renal with increased echogenicity consistent with CKD. He will probably need kidney biopsy for the definitive diagnosis. Will wait pending lab results and monitor platelet counts. IR following for posibility of biopsy- want plts over 100  -He has no sign of uremia. No need for dialysis today, hopefully the kidney function will start improving- rate of rise is less.  I have discussed above with the patient at length today.  Continue to hold losartan/HCTZ. Hold allopurinol.  #Metabolic acidosis due to renal failure: Improved.  #Hypomagnesemia: Due to carboplatin.  Replete magnesium sulfate.  1.8 today   # Hypokalemia: replete KCL. Again   #Hypertension: Monitor BP. Continue metoprolol. Seems fine  #Anemia  due to chemotherapy: Received 2 units of PRBC transfusion on 04/14/2019.  Monitor CBC. Drifting down again   #Thrombocytopenia: Due to chemo.  No sign of bleeding.   #Small cell lung cancer: On chemotherapy.  Per oncology.   Subjective:  Moved to [facility] for needing renal biopsy and possibly dialysis.  He looks great, no complaints   Objective Vital signs in last 24 hours: Vitals:   04/17/19 1627 04/17/19 2028 04/18/19 0430 04/18/19 0907  BP: (!) 147/82 (!) 148/85 136/80 (!) 142/75  Pulse: 94 86 84 86  Resp: 18   16  Temp: 98.4 F (36.9 C) 98.4 F (36.9 C) 98.7 F (37.1 C) 98.2 F (36.8 C)  TempSrc: Oral Oral Oral Oral  SpO2: 97% 94% 90% 94%  Weight: 93.8 kg     Height: 5\' 9"  (1.753 m)      Weight change:   Intake/Output Summary (Last 24 hours) at 04/18/2019 1108 Last data filed at 04/18/2019 0923 Gross per 24 hour  Intake 1807.36 ml  Output 400 ml  Net 1407.36 ml       Labs: Basic Metabolic Panel: Recent Labs  Lab 04/16/19 0442 04/17/19 0424 04/18/19 0154  NA 135 135 135  K 3.8 3.4* 3.4*  CL 106 99 101  CO2 15* 23 21*  GLUCOSE 105* 124* 105*  BUN 79* 73* 69*  CREATININE 10.73* 11.70* 11.98*  CALCIUM 7.9* 7.4* 7.3*  PHOS 7.1* 7.2* 7.5*   Liver Function Tests: Recent Labs  Lab 04/14/19 0959 04/14/19 1247  04/16/19 0442 04/17/19 0424 04/18/19  0154  AST 11* 13*  --   --   --   --   ALT 9 12  --   --   --   --   ALKPHOS 97 90  --   --   --   --   BILITOT 0.3 0.3  --   --   --   --   PROT 5.9* 6.3*  --   --   --   --   ALBUMIN 2.4* 2.7*   < > 2.3* 2.0* 1.8*   < > = values in this interval not displayed.   Recent Labs  Lab 04/18/19 0154  LIPASE 22   No results for input(s): AMMONIA in the last 168 hours. CBC: Recent Labs  Lab 04/14/19 0959  04/14/19 1247  04/15/19 0854 04/16/19 0442 04/17/19 0424 04/18/19 0154  WBC 11.5*   < > 12.2*  --  11.2* 11.4* 10.6* 10.7*  NEUTROABS 8.6*  --  8.9*  --   --   --   --  7.8*  HGB 6.8*  --  6.9*   <  > 8.9* 8.2* 7.5* 7.1*  HCT 20.3*  --  19.9*   < > 26.1* 24.2* 21.3* 19.8*  MCV 92.7  --  91.7  --  88.8 88.3 86.6 85.7  PLT 34*   < > 32*  --  38* 45* 57* 72*   < > = values in this interval not displayed.   Cardiac Enzymes: No results for input(s): CKTOTAL, CKMB, CKMBINDEX, TROPONINI in the last 168 hours. CBG: No results for input(s): GLUCAP in the last 168 hours.  Iron Studies: No results for input(s): IRON, TIBC, TRANSFERRIN, FERRITIN in the last 72 hours. Studies/Results: Ct Abdomen Pelvis Wo Contrast  Result Date: 04/17/2019 CLINICAL DATA:  Inpatient. Gross hematuria with passage of clots. Acute renal failure. History of small cell lung cancer diagnosed March 2020 treated with chemotherapy. EXAM: CT ABDOMEN AND PELVIS WITHOUT CONTRAST TECHNIQUE: Multidetector CT imaging of the abdomen and pelvis was performed following the standard protocol without IV contrast. COMPARISON:  01/22/2019 PET-CT. FINDINGS: Lower chest: Trace dependent bilateral pleural effusions with mild dependent bibasilar atelectasis. Hepatobiliary: Normal liver size. No liver mass. Normal gallbladder with no radiopaque cholelithiasis. No biliary ductal dilatation. Pancreas: No pancreatic mass or duct dilation. There is mild haziness of the peripancreatic fat. There is new fat stranding and ill-defined fluid throughout the anterior paranephric spaces bilaterally extending into the pelvis. Spleen: Normal size. No mass. Adrenals/Urinary Tract: Normal adrenals. No hydronephrosis. No renal stones. Normal caliber ureters. No ureteral stones. Several simple renal cysts scattered in the right kidney, largest 7.2 cm in posterior lower right kidney. No contour deforming left renal masses. Normal bladder. Stomach/Bowel: Small hiatal hernia. Otherwise normal nondistended stomach. Normal caliber small bowel with no small bowel wall thickening. Appendix not discretely visualized. Oral contrast transits to left colon. Partial visualization of a  midline supraumbilical ventral hernia containing a small portion of the transverse colon, with no evidence of associated colonic wall thickening, pneumatosis or caliber transition. Small left inguinal hernia contains a small portion of the proximal sigmoid colon, with no associated colonic wall thickening, pneumatosis or caliber transition. No large bowel wall thickening, significant diverticulosis or significant pericolonic fat stranding. Vascular/Lymphatic: Atherosclerotic abdominal aorta with 3.2 cm infrarenal abdominal aortic aneurysm. No pathologically enlarged lymph nodes in the abdomen or pelvis. Reproductive: Top-normal size prostate. Other: No pneumoperitoneum, ascites or focal fluid collection. Musculoskeletal: No aggressive appearing focal osseous lesions. Moderate  thoracolumbar spondylosis. IMPRESSION: 1. No urolithiasis. No hydronephrosis. No acute bladder abnormality. 2. Mild haziness of the peripancreatic fat. New fat stranding and ill-defined fluid throughout the anterior paranephric retroperitoneal spaces bilaterally extending into the pelvis. Acute pancreatitis cannot be excluded. Suggest correlation with serum lipase. No biliary or pancreatic duct dilation on this noncontrast scan. 3. Trace dependent bilateral pleural effusions. 4. Midline supraumbilical ventral hernia contains a portion of the transverse colon. Left inguinal hernia contains a portion of the sigmoid colon. No acute bowel complication. 5. Small hiatal hernia. 6. Infrarenal 3.2 cm Abdominal Aortic Aneurysm (ICD10-I71.9). Recommend follow-up aortic ultrasound in 3 years. This recommendation follows ACR consensus guidelines: White Paper of the ACR Incidental Findings Committee II on Vascular Findings. J Am Coll Radiol 2013; 10:789-794. 7.  Aortic Atherosclerosis (ICD10-I70.0). Electronically Signed   By: Ilona Sorrel M.D.   On: 04/17/2019 16:09    Medications: Infusions: . sodium chloride 75 mL/hr at 04/17/19 2329    Scheduled  Medications: . calcitRIOL  0.25 mcg Oral Daily  . calcium acetate  667 mg Oral TID WC  . levothyroxine  175 mcg Oral QAC breakfast  . metoprolol tartrate  12.5 mg Oral BID  . pantoprazole  40 mg Oral Daily  . simvastatin  10 mg Oral q1800  . sucralfate  1 g Oral TID WC & HS    have reviewed scheduled and prn medications.  Physical Exam: General: Not in distress, comfortable Heart:RRR, s1s2 nl no rubs Lungs: Bilateral, no wheezing or crackle Abdomen:soft, Non-tender, non-distended Extremities: No edema Neurology: Alert, awake, nonfocal, no asterixis Skin: No rash or ulcer  Katniss Weedman A Teylor Wolven 04/18/2019,11:08 AM  LOS: 4 days

## 2019-04-18 NOTE — Progress Notes (Signed)
Platelets continue to slowly trend up; improved to 72K today.  Will continue to monitor for possible kidney biopsy in IR early next week.   Brynda Greathouse, MS RD PA-C 6:33 AM

## 2019-04-18 NOTE — Progress Notes (Signed)
PROGRESS NOTE    Jeremy Johnson  VOH:[id number] DOB: [birth year] DOA: 04/14/2019 PCP: [name], MD   Brief Narrative:  73 year old with history of limited stage small cell lung cancer, anemia of chronic disease, CKD stage III, essential hypertension, hypothyroidism, hyperlipidemia, coronary artery disease sent to the hospital by oncologist for evaluation of acute kidney injury 9.5 Hemoglobin of 6.9.  Patient was transferred to [facility] for possible renal biopsy and initiation of dialysis.   Assessment & Plan:   Principal Problem:   Acute renal failure (ARF) (HCC) Active Problems:   Coronary atherosclerosis   Chronic kidney disease   GERD (gastroesophageal reflux disease)   CAD (coronary artery disease)   Hyperlipidemia   Hypertension   Gout   Hypothyroidism   Small cell lung cancer (HCC)   Anemia in chronic kidney disease (CKD)   Metabolic acidosis   Acute renal failure superimposed on stage 3 chronic kidney disease (HCC)   Small cell lung cancer, right upper lobe (HCC)  Acute on chronic kidney disease, stage III Acute metabolic acidosis secondary to renal dysfunction -Baseline creatinine 2.2.  Admission creatinine 9.52.  Creatinine today is 11.7 -Renal ultrasound-negative for hydronephrosis.  Inflammatory markers elevated - Concern secondary to carboplatin, losartan/hydrochlorothiazide.  Last chemo 5/18 -Patient transferred here for consideration of renal biopsy and initiation of hemodialysis; but this this does not appear to be emergent because of good urine output and stable electrolytes. -Currently patient's bicarb and potassium appears to be stable with decent urine output.  Acute anemia of chronic disease, hemoglobin of 6.9 Thrombocytopenia - Likely secondary to chemotherapy.  Status post units of PRBC transfusion. Hb drifted down to 7.1 -No obvious evidence of bleeding except slight hematuria.  Monitor  Hematuria -Secondary to thrombocytopenia.  Case was  discussed with urology by the previous provider, recommending CT of the abdomen pelvis.  CT abdomen pelvis showed possible pancreatitis, trace bilateral pleural effusion, midline supraumbilical ventral hernia, 3.2 cm AAA. -Check lipase  Coronary artery disease As patient is currently chest pain-free.  Aspirin on hold due to thrombocytopenia.  Continue metoprolol and statin  Essential hypertension - Holding nephrotoxic medication including losartan hydrochlorothiazide.  Continue metoprolol  Limited stage small cell lung cancer - Last chemo 5/18.  Last radiation 03/17/19.  Initial diagnosis March 2020.  Follow oncology recommendations  DVT prophylaxis: SCDs Code Status: Full code Family Communication: None at bedside.  Offered to speak with this family but states no for now Disposition Plan: Maintain hospital stay until renal function has improved and stabilized  Consultants:   Oncology  Renal  Procedures:   None  Antimicrobials:   None   Subjective: States his hematuria is better and has produced decent urine over last 24 hours and has mostly been light yellow.  Denies any shortness of breath or other complaints.  Review of Systems Otherwise negative except as per HPI, including: General: Denies fever, chills, night sweats or unintended weight loss. Resp: Denies cough, wheezing, shortness of breath. Cardiac: Denies chest pain, palpitations, orthopnea, paroxysmal nocturnal dyspnea. GI: Denies abdominal pain, nausea, vomiting, diarrhea or constipation GU: Denies dysuria, frequency, hesitancy or incontinence MS: Denies muscle aches, joint pain or swelling Neuro: Denies headache, neurologic deficits (focal weakness, numbness, tingling), abnormal gait Psych: Denies anxiety, depression, SI/HI/AVH Skin: Denies new rashes or lesions ID: Denies sick contacts, exotic exposures, travel  Objective: Vitals:   04/17/19 1525 04/17/19 1627 04/17/19 2028 04/18/19 0430  BP: (!) 153/88  (!) 147/82 (!) 148/85 136/80  Pulse: 88 94  86 84  Resp: 17 18    Temp: 98.5 F (36.9 C) 98.4 F (36.9 C) 98.4 F (36.9 C) 98.7 F (37.1 C)  TempSrc: Oral Oral Oral Oral  SpO2: 95% 97% 94% 90%  Weight:  93.8 kg    Height:  5\' 9"  (1.753 m)      Intake/Output Summary (Last 24 hours) at 04/18/2019 0729 Last data filed at 04/18/2019 0545 Gross per 24 hour  Intake 1507.36 ml  Output 0 ml  Net 1507.36 ml   Filed Weights   04/14/19 2258 04/16/19 0519 04/17/19 1627  Weight: 95.2 kg 93.1 kg 93.8 kg    Examination:  General exam: Appears calm and comfortable  Respiratory system: Fine bibasilar crackles Cardiovascular system: S1 & S2 heard, RRR. No JVD, murmurs, rubs, gallops or clicks. No pedal edema. Gastrointestinal system: Abdomen is nondistended, soft and nontender. No organomegaly or masses felt. Normal bowel sounds heard. Central nervous system: Alert and oriented. No focal neurological deficits. Extremities: Symmetric 5 x 5 power. Skin: No rashes, lesions or ulcers Psychiatry: Judgement and insight appear normal. Mood & affect appropriate.     Data Reviewed:   CBC: Recent Labs  Lab 04/14/19 0959  04/14/19 1247 04/15/19 0044 04/15/19 0854 04/16/19 0442 04/17/19 0424 04/18/19 0154  WBC 11.5*  --  12.2*  --  11.2* 11.4* 10.6* 10.7*  NEUTROABS 8.6*  --  8.9*  --   --   --   --  7.8*  HGB 6.8*   < > 6.9* 7.7* 8.9* 8.2* 7.5* 7.1*  HCT 20.3*  --  19.9* 23.2* 26.1* 24.2* 21.3* 19.8*  MCV 92.7  --  91.7  --  88.8 88.3 86.6 85.7  PLT 34*  --  32*  --  38* 45* 57* 72*   < > = values in this interval not displayed.   Basic Metabolic Panel: Recent Labs  Lab 04/14/19 1247 04/15/19 0055 04/16/19 0442 04/17/19 0424 04/18/19 0154  NA 133* 136 135 135 135  K 4.4 4.9 3.8 3.4* 3.4*  CL 103 109 106 99 101  CO2 16* 14* 15* 23 21*  GLUCOSE 113* 103* 105* 124* 105*  BUN 82* 82* 79* 73* 69*  CREATININE 9.86* 10.18* 10.73* 11.70* 11.98*  CALCIUM 7.8* 7.4* 7.9* 7.4* 7.3*  MG   --  1.4* 1.7 1.5* 1.8  PHOS  --  6.4* 7.1* 7.2* 7.5*   GFR: Estimated Creatinine Clearance: 6.3 mL/min (A) (by C-G formula based on SCr of 11.98 mg/dL (H)). Liver Function Tests: Recent Labs  Lab 04/14/19 0959 04/14/19 1247 04/15/19 0055 04/16/19 0442 04/17/19 0424 04/18/19 0154  AST 11* 13*  --   --   --   --   ALT 9 12  --   --   --   --   ALKPHOS 97 90  --   --   --   --   BILITOT 0.3 0.3  --   --   --   --   PROT 5.9* 6.3*  --   --   --   --   ALBUMIN 2.4* 2.7* 2.3* 2.3* 2.0* 1.8*   No results for input(s): LIPASE, AMYLASE in the last 168 hours. No results for input(s): AMMONIA in the last 168 hours. Coagulation Profile: Recent Labs  Lab 04/17/19 0951  INR 1.2   Cardiac Enzymes: No results for input(s): CKTOTAL, CKMB, CKMBINDEX, TROPONINI in the last 168 hours. BNP (last 3 results) No results for input(s): PROBNP in the last 8760  hours. HbA1C: No results for input(s): HGBA1C in the last 72 hours. CBG: No results for input(s): GLUCAP in the last 168 hours. Lipid Profile: No results for input(s): CHOL, HDL, LDLCALC, TRIG, CHOLHDL, LDLDIRECT in the last 72 hours. Thyroid Function Tests: No results for input(s): TSH, T4TOTAL, FREET4, T3FREE, THYROIDAB in the last 72 hours. Anemia Panel: No results for input(s): VITAMINB12, FOLATE, FERRITIN, TIBC, IRON, RETICCTPCT in the last 72 hours. Sepsis Labs: No results for input(s): PROCALCITON, LATICACIDVEN in the last 168 hours.  Recent Results (from the past 240 hour(s))  SARS Coronavirus 2 (CEPHEID - Performed in [facility] hospital lab), Hosp Order     Status: None   Collection Time: 04/14/19  2:13 PM  Result Value Ref Range Status   SARS Coronavirus 2 NEGATIVE NEGATIVE Final    Comment: (NOTE) If result is NEGATIVE SARS-CoV-2 target nucleic acids are NOT DETECTED. The SARS-CoV-2 RNA is generally detectable in upper and lower  respiratory specimens during the acute phase of infection. The lowest  concentration of  SARS-CoV-2 viral copies this assay can detect is 250  copies / mL. A negative result does not preclude SARS-CoV-2 infection  and should not be used as the sole basis for treatment or other  patient management decisions.  A negative result may occur with  improper specimen collection / handling, submission of specimen other  than nasopharyngeal swab, presence of viral mutation(s) within the  areas targeted by this assay, and inadequate number of viral copies  (<250 copies / mL). A negative result must be combined with clinical  observations, patient history, and epidemiological information. If result is POSITIVE SARS-CoV-2 target nucleic acids are DETECTED. The SARS-CoV-2 RNA is generally detectable in upper and lower  respiratory specimens dur ing the acute phase of infection.  Positive  results are indicative of active infection with SARS-CoV-2.  Clinical  correlation with patient history and other diagnostic information is  necessary to determine patient infection status.  Positive results do  not rule out bacterial infection or co-infection with other viruses. If result is PRESUMPTIVE POSTIVE SARS-CoV-2 nucleic acids MAY BE PRESENT.   A presumptive positive result was obtained on the submitted specimen  and confirmed on repeat testing.  While 2019 novel coronavirus  (SARS-CoV-2) nucleic acids may be present in the submitted sample  additional confirmatory testing may be necessary for epidemiological  and / or clinical management purposes  to differentiate between  SARS-CoV-2 and other Sarbecovirus currently known to infect humans.  If clinically indicated additional testing with an alternate test  methodology [phone]) is advised. The SARS-CoV-2 RNA is generally  detectable in upper and lower respiratory sp ecimens during the acute  phase of infection. The expected result is Negative. Fact Sheet for Patients:  StrictlyIdeas.no Fact Sheet for Healthcare  Providers: BankingDealers.co.za This test is not yet approved or cleared by the [place] FDA and has been authorized for detection and/or diagnosis of SARS-CoV-2 by FDA under an Emergency Use Authorization (EUA).  This EUA will remain in effect (meaning this test can be used) for the duration of the COVID-19 declaration under Section 564(b)(1) of the Act, 21 U.S.C. section 360bbb-3(b)(1), unless the authorization is terminated or revoked sooner. Performed at [facility], [address], [place], [place] [address]          Radiology Studies: Ct Abdomen Pelvis Wo Contrast  Result Date: 04/17/2019 CLINICAL DATA:  Inpatient. Gross hematuria with passage of clots. Acute renal failure. History of small cell lung cancer diagnosed  March 2020 treated with chemotherapy. EXAM: CT ABDOMEN AND PELVIS WITHOUT CONTRAST TECHNIQUE: Multidetector CT imaging of the abdomen and pelvis was performed following the standard protocol without IV contrast. COMPARISON:  01/22/2019 PET-CT. FINDINGS: Lower chest: Trace dependent bilateral pleural effusions with mild dependent bibasilar atelectasis. Hepatobiliary: Normal liver size. No liver mass. Normal gallbladder with no radiopaque cholelithiasis. No biliary ductal dilatation. Pancreas: No pancreatic mass or duct dilation. There is mild haziness of the peripancreatic fat. There is new fat stranding and ill-defined fluid throughout the anterior paranephric spaces bilaterally extending into the pelvis. Spleen: Normal size. No mass. Adrenals/Urinary Tract: Normal adrenals. No hydronephrosis. No renal stones. Normal caliber ureters. No ureteral stones. Several simple renal cysts scattered in the right kidney, largest 7.2 cm in posterior lower right kidney. No contour deforming left renal masses. Normal bladder. Stomach/Bowel: Small hiatal hernia. Otherwise normal nondistended stomach. Normal caliber small bowel with no small bowel wall thickening.  Appendix not discretely visualized. Oral contrast transits to left colon. Partial visualization of a midline supraumbilical ventral hernia containing a small portion of the transverse colon, with no evidence of associated colonic wall thickening, pneumatosis or caliber transition. Small left inguinal hernia contains a small portion of the proximal sigmoid colon, with no associated colonic wall thickening, pneumatosis or caliber transition. No large bowel wall thickening, significant diverticulosis or significant pericolonic fat stranding. Vascular/Lymphatic: Atherosclerotic abdominal aorta with 3.2 cm infrarenal abdominal aortic aneurysm. No pathologically enlarged lymph nodes in the abdomen or pelvis. Reproductive: Top-normal size prostate. Other: No pneumoperitoneum, ascites or focal fluid collection. Musculoskeletal: No aggressive appearing focal osseous lesions. Moderate thoracolumbar spondylosis. IMPRESSION: 1. No urolithiasis. No hydronephrosis. No acute bladder abnormality. 2. Mild haziness of the peripancreatic fat. New fat stranding and ill-defined fluid throughout the anterior paranephric retroperitoneal spaces bilaterally extending into the pelvis. Acute pancreatitis cannot be excluded. Suggest correlation with serum lipase. No biliary or pancreatic duct dilation on this noncontrast scan. 3. Trace dependent bilateral pleural effusions. 4. Midline supraumbilical ventral hernia contains a portion of the transverse colon. Left inguinal hernia contains a portion of the sigmoid colon. No acute bowel complication. 5. Small hiatal hernia. 6. Infrarenal 3.2 cm Abdominal Aortic Aneurysm (ICD10-I71.9). Recommend follow-up aortic ultrasound in 3 years. This recommendation follows ACR consensus guidelines: White Paper of the ACR Incidental Findings Committee II on Vascular Findings. J Am Coll Radiol 2013; 10:789-794. 7.  Aortic Atherosclerosis (ICD10-I70.0). Electronically Signed   By: Ilona Sorrel M.D.   On:  04/17/2019 16:09        Scheduled Meds: . calcitRIOL  0.25 mcg Oral Daily  . calcium acetate  667 mg Oral TID WC  . levothyroxine  175 mcg Oral QAC breakfast  . metoprolol tartrate  12.5 mg Oral BID  . pantoprazole  40 mg Oral Daily  . simvastatin  10 mg Oral q1800  . sucralfate  1 g Oral TID WC & HS   Continuous Infusions: . sodium chloride 75 mL/hr at 04/17/19 2329     LOS: 4 days   Time spent= 35 mins    Hiba Garry Arsenio Loader, MD [facility]  If 7PM-7AM, please contact night-coverage [email or web address] 04/18/2019, 7:29 AM

## 2019-04-19 LAB — COMPREHENSIVE METABOLIC PANEL
ALT: 13 U/L (ref 0–44)
AST: 16 U/L (ref 15–41)
Albumin: 1.8 g/dL — ABNORMAL LOW (ref 3.5–5.0)
Alkaline Phosphatase: 70 U/L (ref 38–126)
Anion gap: 12 (ref 5–15)
BUN: 72 mg/dL — ABNORMAL HIGH (ref 8–23)
CO2: 20 mmol/L — ABNORMAL LOW (ref 22–32)
Calcium: 7.6 mg/dL — ABNORMAL LOW (ref 8.9–10.3)
Chloride: 104 mmol/L (ref 98–111)
Creatinine, Ser: 13.15 mg/dL — ABNORMAL HIGH (ref 0.61–1.24)
GFR calc Af Amer: 4 mL/min — ABNORMAL LOW (ref >60)
GFR calc non Af Amer: 3 mL/min — ABNORMAL LOW (ref >60)
Glucose, Bld: 95 mg/dL (ref 70–99)
Potassium: 4 mmol/L (ref 3.5–5.1)
Sodium: 136 mmol/L (ref 135–145)
Total Bilirubin: 0.5 mg/dL (ref 0.3–1.2)
Total Protein: 4.7 g/dL — ABNORMAL LOW (ref 6.5–8.1)

## 2019-04-19 LAB — CBC
HCT: 20.5 % — ABNORMAL LOW (ref 39.0–52.0)
Hemoglobin: 7 g/dL — ABNORMAL LOW (ref 13.0–17.0)
MCH: 29.9 pg (ref 26.0–34.0)
MCHC: 34.1 g/dL (ref 30.0–36.0)
MCV: 87.6 fL (ref 80.0–100.0)
Platelets: 97 10*3/uL — ABNORMAL LOW (ref 150–400)
RBC: 2.34 MIL/uL — ABNORMAL LOW (ref 4.22–5.81)
RDW: 15 % (ref 11.5–15.5)
WBC: 10.7 10*3/uL — ABNORMAL HIGH (ref 4.0–10.5)
nRBC: 0 % (ref 0.0–0.2)

## 2019-04-19 LAB — MAGNESIUM: Magnesium: 1.7 mg/dL (ref 1.7–2.4)

## 2019-04-19 NOTE — Progress Notes (Signed)
PROGRESS NOTE    Jeremy Johnson  EPP:[id number] DOB: 09-22-1946 DOA: 04/14/2019 PCP: [name], MD   Brief Narrative:  73 year old with history of limited stage small cell lung cancer, anemia of chronic disease, CKD stage III, essential hypertension, hypothyroidism, hyperlipidemia, coronary artery disease sent to the hospital by oncologist for evaluation of acute kidney injury 9.5 Hemoglobin of 6.9.  Patient was transferred to [facility] for possible renal biopsy and initiation of dialysis.  04/19/2019: Patient seen.  Available records reviewed.  Nephrology input is appreciated.  BUN is 72 and serum creatinine is 13.15 today.  Kidney biopsy is planned for in 2 days time.  Otherwise, no new complaints.   Assessment & Plan:   Principal Problem:   Acute renal failure (ARF) (HCC) Active Problems:   Coronary atherosclerosis   Chronic kidney disease   GERD (gastroesophageal reflux disease)   CAD (coronary artery disease)   Hyperlipidemia   Hypertension   Gout   Hypothyroidism   Small cell lung cancer (HCC)   Anemia in chronic kidney disease (CKD)   Metabolic acidosis   Acute renal failure superimposed on stage 3 chronic kidney disease (HCC)   Small cell lung cancer, right upper lobe (HCC)  Acute on chronic kidney disease, stage III Acute metabolic acidosis secondary to renal dysfunction -Baseline creatinine 2.2.  Admission creatinine 9.52.  Creatinine today is 11.7 -Renal ultrasound-negative for hydronephrosis.  Inflammatory markers elevated - Concern secondary to carboplatin, losartan/hydrochlorothiazide.  Last chemo 5/18 -Patient transferred here for consideration of renal biopsy and initiation of hemodialysis; but this this does not appear to be emergent because of good urine output and stable electrolytes. -Currently patient's bicarb and potassium appears to be stable with decent urine output. 04/19/2019: Definitive etiology of her acute kidney injury remains uncertain.   For renal biopsy on Monday (2 days time).  No indication for emergent renal replacement therapy.  Nephrology input is highly appreciated.  Acute anemia of chronic disease, hemoglobin of 6.9 Thrombocytopenia - Likely secondary to chemotherapy.  Status post units of PRBC transfusion. Hb drifted down to 7.1 -No obvious evidence of bleeding except slight hematuria.  Monitor 04/19/2019: Anemia and thrombocytopenia likely multifactorial.  Patient is currently undergoing chemotherapy for lung cancer.  Patient also has chronic kidney disease, with recent acute kidney injury.  Recent hematuria was also reported.  Monitor closely, and transfuse packed red blood cells as deemed necessary.    Hematuria -Etiology is unclear.  Possibly secondary to thrombocytopenia.   -Case was discussed with urology by the previous provider, recommending CT of the abdomen pelvis.  CT abdomen pelvis showed possible pancreatitis, trace bilateral pleural effusion, midline supraumbilical ventral hernia, 3.2 cm AAA.  Coronary artery disease As patient is currently chest pain-free.  Aspirin on hold due to thrombocytopenia.  Continue metoprolol and statin  Essential hypertension - Holding nephrotoxic medication including losartan hydrochlorothiazide.  Continue metoprolol  Limited stage small cell lung cancer - Last chemo 5/18.  Last radiation 03/17/19.  Initial diagnosis March 2020.  Follow oncology recommendations  DVT prophylaxis: SCDs Code Status: Full code Family Communication: None at bedside.  Offered to speak with this family but states no for now Disposition Plan: Maintain hospital stay until renal function has improved and stabilized  Consultants:   Oncology  Renal  Procedures:   None  Antimicrobials:   None   Subjective: Patient seen. No new complaints. No fever or chills. No shortness of breath.  Objective: Vitals:   04/18/19 2111 04/19/19 0500 04/19/19 6063  04/19/19 0821  BP: 136/76  (!) 141/84  131/64  Pulse: 83  78 88  Resp: 18  18 18   Temp: 99.7 F (37.6 C)  98.4 F (36.9 C) 98.4 F (36.9 C)  TempSrc: Oral  Oral Oral  SpO2: 90%  (!) 89% 92%  Weight: 93.8 kg 93.8 kg    Height:        Intake/Output Summary (Last 24 hours) at 04/19/2019 1444 Last data filed at 04/19/2019 1351 Gross per 24 hour  Intake 2160.97 ml  Output 2425 ml  Net -264.03 ml   Filed Weights   04/17/19 1627 04/18/19 2111 04/19/19 0500  Weight: 93.8 kg 93.8 kg 93.8 kg    Examination:  General exam: Appears calm and comfortable  Respiratory system: Adequate air entry. Cardiovascular system: S1 & S2 Gastrointestinal system: Abdomen is soft and nontender. No organomegaly or masses felt. Normal bowel sounds heard. Central nervous system: Alert and oriented.  Is all extremities. Extremities: Minimal leg edema  Data Reviewed:   CBC: Recent Labs  Lab 04/14/19 0959  04/14/19 1247  04/15/19 0854 04/16/19 0442 04/17/19 0424 04/18/19 0154 04/19/19 0303  WBC 11.5*   < > 12.2*  --  11.2* 11.4* 10.6* 10.7* 10.7*  NEUTROABS 8.6*  --  8.9*  --   --   --   --  7.8*  --   HGB 6.8*  --  6.9*   < > 8.9* 8.2* 7.5* 7.1* 7.0*  HCT 20.3*  --  19.9*   < > 26.1* 24.2* 21.3* 19.8* 20.5*  MCV 92.7  --  91.7  --  88.8 88.3 86.6 85.7 87.6  PLT 34*   < > 32*  --  38* 45* 57* 72* 97*   < > = values in this interval not displayed.   Basic Metabolic Panel: Recent Labs  Lab 04/15/19 0055 04/16/19 0442 04/17/19 0424 04/18/19 0154 04/19/19 0303  NA 136 135 135 135 136  K 4.9 3.8 3.4* 3.4* 4.0  CL 109 106 99 101 104  CO2 14* 15* 23 21* 20*  GLUCOSE 103* 105* 124* 105* 95  BUN 82* 79* 73* 69* 72*  CREATININE 10.18* 10.73* 11.70* 11.98* 13.15*  CALCIUM 7.4* 7.9* 7.4* 7.3* 7.6*  MG 1.4* 1.7 1.5* 1.8 1.7  PHOS 6.4* 7.1* 7.2* 7.5*  --    GFR: Estimated Creatinine Clearance: 5.7 mL/min (A) (by C-G formula based on SCr of 13.15 mg/dL (H)). Liver Function Tests: Recent Labs  Lab 04/14/19 0959 04/14/19 1247  04/15/19 0055 04/16/19 0442 04/17/19 0424 04/18/19 0154 04/19/19 0303  AST 11* 13*  --   --   --   --  16  ALT 9 12  --   --   --   --  13  ALKPHOS 97 90  --   --   --   --  70  BILITOT 0.3 0.3  --   --   --   --  0.5  PROT 5.9* 6.3*  --   --   --   --  4.7*  ALBUMIN 2.4* 2.7* 2.3* 2.3* 2.0* 1.8* 1.8*   Recent Labs  Lab 04/18/19 0154  LIPASE 22   No results for input(s): AMMONIA in the last 168 hours. Coagulation Profile: Recent Labs  Lab 04/17/19 0951  INR 1.2   Cardiac Enzymes: No results for input(s): CKTOTAL, CKMB, CKMBINDEX, TROPONINI in the last 168 hours. BNP (last 3 results) No results for input(s): PROBNP in the last 8760 hours. HbA1C:  No results for input(s): HGBA1C in the last 72 hours. CBG: No results for input(s): GLUCAP in the last 168 hours. Lipid Profile: No results for input(s): CHOL, HDL, LDLCALC, TRIG, CHOLHDL, LDLDIRECT in the last 72 hours. Thyroid Function Tests: No results for input(s): TSH, T4TOTAL, FREET4, T3FREE, THYROIDAB in the last 72 hours. Anemia Panel: No results for input(s): VITAMINB12, FOLATE, FERRITIN, TIBC, IRON, RETICCTPCT in the last 72 hours. Sepsis Labs: No results for input(s): PROCALCITON, LATICACIDVEN in the last 168 hours.  Recent Results (from the past 240 hour(s))  SARS Coronavirus 2 (CEPHEID - Performed in [place] hospital lab), Hosp Order     Status: None   Collection Time: 04/14/19  2:13 PM  Result Value Ref Range Status   SARS Coronavirus 2 NEGATIVE NEGATIVE Final    Comment: (NOTE) If result is NEGATIVE SARS-CoV-2 target nucleic acids are NOT DETECTED. The SARS-CoV-2 RNA is generally detectable in upper and lower  respiratory specimens during the acute phase of infection. The lowest  concentration of SARS-CoV-2 viral copies this assay can detect is 250  copies / mL. A negative result does not preclude SARS-CoV-2 infection  and should not be used as the sole basis for treatment or other  patient management  decisions.  A negative result may occur with  improper specimen collection / handling, submission of specimen other  than nasopharyngeal swab, presence of viral mutation(s) within the  areas targeted by this assay, and inadequate number of viral copies  (<250 copies / mL). A negative result must be combined with clinical  observations, patient history, and epidemiological information. If result is POSITIVE SARS-CoV-2 target nucleic acids are DETECTED. The SARS-CoV-2 RNA is generally detectable in upper and lower  respiratory specimens dur ing the acute phase of infection.  Positive  results are indicative of active infection with SARS-CoV-2.  Clinical  correlation with patient history and other diagnostic information is  necessary to determine patient infection status.  Positive results do  not rule out bacterial infection or co-infection with other viruses. If result is PRESUMPTIVE POSTIVE SARS-CoV-2 nucleic acids MAY BE PRESENT.   A presumptive positive result was obtained on the submitted specimen  and confirmed on repeat testing.  While 2019 novel coronavirus  (SARS-CoV-2) nucleic acids may be present in the submitted sample  additional confirmatory testing may be necessary for epidemiological  and / or clinical management purposes  to differentiate between  SARS-CoV-2 and other Sarbecovirus currently known to infect humans.  If clinically indicated additional testing with an alternate test  methodology [phone]) is advised. The SARS-CoV-2 RNA is generally  detectable in upper and lower respiratory sp ecimens during the acute  phase of infection. The expected result is Negative. Fact Sheet for Patients:  StrictlyIdeas.no Fact Sheet for Healthcare Providers: BankingDealers.co.za This test is not yet approved or cleared by the [place] FDA and has been authorized for detection and/or diagnosis of SARS-CoV-2 by FDA under an  Emergency Use Authorization (EUA).  This EUA will remain in effect (meaning this test can be used) for the duration of the COVID-19 declaration under Section 564(b)(1) of the Act, 21 U.S.C. section 360bbb-3(b)(1), unless the authorization is terminated or revoked sooner. Performed at [facility], [address] [place], [place] [address]          Radiology Studies: No results found.      Scheduled Meds: . calcitRIOL  0.25 mcg Oral Daily  . calcium acetate  667 mg Oral TID WC  . levothyroxine  175 mcg Oral QAC breakfast  . metoprolol tartrate  12.5 mg Oral BID  . pantoprazole  40 mg Oral Daily  . simvastatin  10 mg Oral q1800  . sucralfate  1 g Oral TID WC & HS   Continuous Infusions:    LOS: 5 days   Time spent= 35 mins    Bonnell Public, MD [facility]  If 7PM-7AM, please contact night-coverage [email or web address] 04/19/2019, 2:44 PM

## 2019-04-19 NOTE — Progress Notes (Signed)
[facility] NEPHROLOGY PROGRESS NOTE  Assessment/ Plan: Pt is a 73 y.o. yo male with  HTN, HLD, AAA s/p repair in 2006 causing damage to the kidney and then CKD with baseline creatinine around 2.5 per patient, recently dx with SCC of lung s/p 3 cycles of carboplatin and etoposide and RT, last chemo on 5/18,  sent to the hospital for abnormal labs including hemoglobin 6.9 and serum creatinine level 9.86.    #Acute kidney injury on CKD - on 5/11 crt was 2.79, 5/18 3.37 then just worse from there.   possibly ATN due to carboplatin, losartan/HCTZ and anemia: Other differential could be glomerulonephritis due to Neulasta vs membranous GN due to cancer vs rare possibility of chemo induced TMA. Urinalysis with proteinuria- over 11 grams and hematuria. -Patient is asymptomatic and had good urine output despite of worsening serum creatinine level to 13 today. Rate of rise is less. No HD indications but starting to get to an uncomfortable range   serologies- c3, c4 WNL, ANCA neg , ANA negative , antiGBM ab negative , hep B, C, HIV all negative , K/l ratio is 1.45. -  Haptoglobin high , INR and LDH slightly high. No mention of schistocytes in CBC differential.   -US renal with increased echogenicity consistent with CKD. He will probably need kidney biopsy for the definitive diagnosis. IR want plts over 100 - biopsy tentatively planned for Monday- will fill out bx form   #Metabolic acidosis due to renal failure: Improved.  #Hypomagnesemia: Due to carboplatin.  Replete magnesium sulfate.  1.7 today   # Hypokalemia: replete KCL. Is fine today   #Hypertension: Monitor BP. Continue metoprolol. Seems fine.  Stop IVF   #Anemia due to chemotherapy: Received 2 units of PRBC transfusion on 04/14/2019.  Monitor CBC. Drifting down again   #Thrombocytopenia: Due to chemo.  No sign of bleeding. Inc nicely    #Small cell lung cancer: On chemotherapy.  Per oncology.   Subjective:  2300 of UOP but  crt up- pt continues to look clinically well - potassium hurt stomach - wants to drink more fluid   Objective Vital signs in last 24 hours: Vitals:   04/18/19 2111 04/19/19 0500 04/19/19 0521 04/19/19 0821  BP: 136/76  (!) 141/84 131/64  Pulse: 83  78 88  Resp: 18  18 18   Temp: 99.7 F (37.6 C)  98.4 F (36.9 C) 98.4 F (36.9 C)  TempSrc: Oral  Oral Oral  SpO2: 90%  (!) 89% 92%  Weight: 93.8 kg 93.8 kg    Height:       Weight change: 0.041 kg  Intake/Output Summary (Last 24 hours) at 04/19/2019 1059 Last data filed at 04/19/2019 0818 Gross per 24 hour  Intake 2619.87 ml  Output 1925 ml  Net 694.87 ml       Labs: Basic Metabolic Panel: Recent Labs  Lab 04/16/19 0442 04/17/19 0424 04/18/19 0154 04/19/19 0303  NA 135 135 135 136  K 3.8 3.4* 3.4* 4.0  CL 106 99 101 104  CO2 15* 23 21* 20*  GLUCOSE 105* 124* 105* 95  BUN 79* 73* 69* 72*  CREATININE 10.73* 11.70* 11.98* 13.15*  CALCIUM 7.9* 7.4* 7.3* 7.6*  PHOS 7.1* 7.2* 7.5*  --    Liver Function Tests: Recent Labs  Lab 04/14/19 0959 04/14/19 1247  04/17/19 0424 04/18/19 0154 04/19/19 0303  AST 11* 13*  --   --   --  16  ALT 9 12  --   --   --  13  ALKPHOS 97 90  --   --   --  70  BILITOT 0.3 0.3  --   --   --  0.5  PROT 5.9* 6.3*  --   --   --  4.7*  ALBUMIN 2.4* 2.7*   < > 2.0* 1.8* 1.8*   < > = values in this interval not displayed.   Recent Labs  Lab 04/18/19 0154  LIPASE 22   No results for input(s): AMMONIA in the last 168 hours. CBC: Recent Labs  Lab 04/14/19 0959  04/14/19 1247  04/15/19 0854 04/16/19 0442 04/17/19 0424 04/18/19 0154 04/19/19 0303  WBC 11.5*   < > 12.2*  --  11.2* 11.4* 10.6* 10.7* 10.7*  NEUTROABS 8.6*  --  8.9*  --   --   --   --  7.8*  --   HGB 6.8*  --  6.9*   < > 8.9* 8.2* 7.5* 7.1* 7.0*  HCT 20.3*  --  19.9*   < > 26.1* 24.2* 21.3* 19.8* 20.5*  MCV 92.7  --  91.7  --  88.8 88.3 86.6 85.7 87.6  PLT 34*   < > 32*  --  38* 45* 57* 72* 97*   < > = values in this  interval not displayed.   Cardiac Enzymes: No results for input(s): CKTOTAL, CKMB, CKMBINDEX, TROPONINI in the last 168 hours. CBG: No results for input(s): GLUCAP in the last 168 hours.  Iron Studies: No results for input(s): IRON, TIBC, TRANSFERRIN, FERRITIN in the last 72 hours. Studies/Results: Ct Abdomen Pelvis Wo Contrast  Result Date: 04/17/2019 CLINICAL DATA:  Inpatient. Gross hematuria with passage of clots. Acute renal failure. History of small cell lung cancer diagnosed March 2020 treated with chemotherapy. EXAM: CT ABDOMEN AND PELVIS WITHOUT CONTRAST TECHNIQUE: Multidetector CT imaging of the abdomen and pelvis was performed following the standard protocol without IV contrast. COMPARISON:  01/22/2019 PET-CT. FINDINGS: Lower chest: Trace dependent bilateral pleural effusions with mild dependent bibasilar atelectasis. Hepatobiliary: Normal liver size. No liver mass. Normal gallbladder with no radiopaque cholelithiasis. No biliary ductal dilatation. Pancreas: No pancreatic mass or duct dilation. There is mild haziness of the peripancreatic fat. There is new fat stranding and ill-defined fluid throughout the anterior paranephric spaces bilaterally extending into the pelvis. Spleen: Normal size. No mass. Adrenals/Urinary Tract: Normal adrenals. No hydronephrosis. No renal stones. Normal caliber ureters. No ureteral stones. Several simple renal cysts scattered in the right kidney, largest 7.2 cm in posterior lower right kidney. No contour deforming left renal masses. Normal bladder. Stomach/Bowel: Small hiatal hernia. Otherwise normal nondistended stomach. Normal caliber small bowel with no small bowel wall thickening. Appendix not discretely visualized. Oral contrast transits to left colon. Partial visualization of a midline supraumbilical ventral hernia containing a small portion of the transverse colon, with no evidence of associated colonic wall thickening, pneumatosis or caliber transition.  Small left inguinal hernia contains a small portion of the proximal sigmoid colon, with no associated colonic wall thickening, pneumatosis or caliber transition. No large bowel wall thickening, significant diverticulosis or significant pericolonic fat stranding. Vascular/Lymphatic: Atherosclerotic abdominal aorta with 3.2 cm infrarenal abdominal aortic aneurysm. No pathologically enlarged lymph nodes in the abdomen or pelvis. Reproductive: Top-normal size prostate. Other: No pneumoperitoneum, ascites or focal fluid collection. Musculoskeletal: No aggressive appearing focal osseous lesions. Moderate thoracolumbar spondylosis. IMPRESSION: 1. No urolithiasis. No hydronephrosis. No acute bladder abnormality. 2. Mild haziness of the peripancreatic fat. New fat stranding and ill-defined fluid throughout the anterior  paranephric retroperitoneal spaces bilaterally extending into the pelvis. Acute pancreatitis cannot be excluded. Suggest correlation with serum lipase. No biliary or pancreatic duct dilation on this noncontrast scan. 3. Trace dependent bilateral pleural effusions. 4. Midline supraumbilical ventral hernia contains a portion of the transverse colon. Left inguinal hernia contains a portion of the sigmoid colon. No acute bowel complication. 5. Small hiatal hernia. 6. Infrarenal 3.2 cm Abdominal Aortic Aneurysm (ICD10-I71.9). Recommend follow-up aortic ultrasound in 3 years. This recommendation follows ACR consensus guidelines: White Paper of the ACR Incidental Findings Committee II on Vascular Findings. J Am Coll Radiol 2013; 10:789-794. 7.  Aortic Atherosclerosis (ICD10-I70.0). Electronically Signed   By: Ilona Sorrel M.D.   On: 04/17/2019 16:09    Medications: Infusions: . sodium chloride 75 mL/hr at 04/19/19 0505    Scheduled Medications: . calcitRIOL  0.25 mcg Oral Daily  . calcium acetate  667 mg Oral TID WC  . levothyroxine  175 mcg Oral QAC breakfast  . metoprolol tartrate  12.5 mg Oral BID  .  pantoprazole  40 mg Oral Daily  . simvastatin  10 mg Oral q1800  . sucralfate  1 g Oral TID WC & HS    have reviewed scheduled and prn medications.  Physical Exam: General: Not in distress, comfortable Heart:RRR, s1s2 nl no rubs Lungs: Bilateral, no wheezing or crackle Abdomen:soft, Non-tender, non-distended Extremities: No edema Neurology: Alert, awake, nonfocal, no asterixis Skin: No rash or ulcer  Stepheni Cameron A Deaysia Grigoryan 04/19/2019,10:59 AM  LOS: 5 days

## 2019-04-19 NOTE — Consult Note (Signed)
Chief Complaint: Patient was seen in consultation today for random renal biopsy Chief Complaint  Patient presents with  . Abnormal Lab   at the request of Dr Shirl Harris  Supervising Physician: Jacqulynn Cadet  Patient Status: [facility] - In-pt  History of Present Illness: Jeremy Johnson is a 73 y.o. male   HTN; HLD Known kidney injury - AAA repair 2006 CKD New Dx lung Cancer Chemo and radiation-- last chemo 03/31/19 Admitted with anemia and worsening renal function Rising Cr level: today 13.15 +proteinuria  [place] 6/2:  IMPRESSION: Increased echogenicity both kidneys. This consistent chronic medical renal disease. Two simple cyst right kidney. No evidence of hydronephrosis or bladder distention. No acute abnormality Identified.  Random renal biopsy ordered per Nephrology Plts 97 today Hg 7 this am Will prepare for Mon June 8 Will recheck plts  And Hg that am  Past Medical History:  Diagnosis Date  . Anemia   . Blood transfusion without reported diagnosis   . CAD (coronary artery disease)    STENT... MID CIRCUMFLEX...1997  . Cancer ([place])   . Chronic kidney disease    STAGE 3  . Degenerative joint disease (DJD) of lumbar spine   . GERD (gastroesophageal reflux disease)   . Gout   . Hyperlipidemia   . Hypertension   . Hypothyroidism   . Incisional hernia   . Leukocytosis    CHRONIC MILD  . Myocardial infarction ([place])    1997    Past Surgical History:  Procedure Laterality Date  . ABDOMINAL AORTIC ANEURYSM REPAIR  2006  . BIOPSY  09/30/2018   Procedure: BIOPSY;  Surgeon: Danie Binder, MD;  [facility]: [facility];  Service: Endoscopy;;  ascending colon  . COLONOSCOPY  2008  . COLONOSCOPY N/A 09/30/2018   Procedure: COLONOSCOPY;  Surgeon: Danie Binder, MD;  Location: [facility];  Service: Endoscopy;  Laterality: N/A;  9:00  . CORONARY ANGIOPLASTY WITH STENT PLACEMENT  2008   MID CIRCUMFLEX  . POLYPECTOMY  09/30/2018   Procedure: POLYPECTOMY;   Surgeon: Danie Binder, MD;  Location: [facility];  Service: Endoscopy;;  colon  . VIDEO BRONCHOSCOPY WITH ENDOBRONCHIAL NAVIGATION N/A 01/27/2019   Procedure: VIDEO BRONCHOSCOPY WITH ENDOBRONCHIAL NAVIGATION;  Surgeon: Grace Isaac, MD;  Location: [place];  Service: Thoracic;  Laterality: N/A;  . VIDEO BRONCHOSCOPY WITH ENDOBRONCHIAL ULTRASOUND N/A 01/27/2019   Procedure: VIDEO BRONCHOSCOPY WITH ENDOBRONCHIAL ULTRASOUND;  Surgeon: Grace Isaac, MD;  Location: [facility];  Service: Thoracic;  Laterality: N/A;    Allergies: Penicillins  Medications: Prior to Admission medications   Medication Sig Start Date End Date Taking? Authorizing Provider  allopurinol (ZYLOPRIM) 300 MG tablet Take 300 mg by mouth daily.   Yes [provider]  amLODipine (NORVASC) 5 MG tablet Take 5 mg by mouth daily.  03/12/18  Yes [provider]  aspirin EC 81 MG tablet Take 81 mg by mouth daily.   Yes [provider]  calcitRIOL (ROCALTROL) 0.25 MCG capsule Take 0.25 mcg by mouth daily.  11/25/18  Yes [provider]  levothyroxine (SYNTHROID, LEVOTHROID) 175 MCG tablet Take 175 mcg by mouth daily before breakfast.   Yes [provider]  losartan-hydrochlorothiazide (HYZAAR) 100-25 MG tablet Take 1 tablet by mouth daily.   Yes [provider]  metoprolol tartrate (LOPRESSOR) 50 MG tablet Take 25 mg by mouth 2 (two) times daily.    Yes [provider]  mometasone (ELOCON) 0.1 % ointment Apply 1 application  topically 2 (two) times daily as needed (for eczema).  07/11/18  Yes [provider]  omeprazole (PRILOSEC) 20 MG capsule Take 20 mg by mouth daily.   Yes [provider]  prochlorperazine (COMPAZINE) 10 MG tablet Take 1 tablet (10 mg total) by mouth every 6 (six) hours as needed for nausea or vomiting. 02/06/19  Yes Curt Bears, MD  simvastatin (ZOCOR) 20 MG tablet Take 20 mg by mouth daily.  03/12/18  Yes [provider]  sucralfate (CARAFATE) 1 g tablet Take 1 tablet (1 g total) by mouth 4 (four) times daily -  with meals and at bedtime. 5 min before meals for radiation induced esophagitis 04/11/19  Yes Tyler Pita, MD     Family History  Problem Relation Age of Onset  . Stroke Brother   . Lung cancer Sister 62       lung cancer/former    Social History   Socioeconomic History  . Marital status: Married    Spouse name: Not on file  . Number of children: 2  . Years of education: Not on file  . Highest education level: Not on file  Occupational History    Comment: retired  Scientific laboratory technician  . Financial resource strain: Not on file  . Food insecurity:    Worry: Not on file    Inability: Not on file  . Transportation needs:    Medical: Not on file    Non-medical: Not on file  Tobacco Use  . Smoking status: Former Smoker    Packs/day: 0.50    Years: 54.00    Pack years: 27.00    Last attempt to quit: 09/17/2017    Years since quitting: 1.5  . Smokeless tobacco: Never Used  Substance and Sexual Activity  . Alcohol use: Yes    Comment: occasional  . Drug use: No  . Sexual activity: Not Currently  Lifestyle  . Physical activity:    Days per week: Not on file    Minutes per session: Not on file  . Stress: Not on file  Relationships  . Social connections:    Talks on phone: Not on file    Gets together: Not on file    Attends religious service: Not on file    Active member of club or organization: Not on file    Attends meetings of clubs or organizations: Not on file    Relationship status: Not on file  Other Topics Concern  . Not on file  Social History Narrative  . Not on file   Review of Systems: A 12 point ROS discussed and pertinent positives are indicated in the HPI above.  All other systems are negative.  Review of Systems  Constitutional: Positive for fatigue. Negative for activity change and fever.  Respiratory: Negative for shortness of breath.   Cardiovascular:  Negative for chest pain.  Gastrointestinal: Negative for abdominal pain.  Neurological: Negative for weakness.  Psychiatric/Behavioral: Negative for behavioral problems and confusion.    Vital Signs: BP 131/64 (BP Location: Left Arm)   Pulse 88   Temp 98.4 F (36.9 C) (Oral)   Resp 18   Ht 5\' 9"  (1.753 m)   Wt 206 lb 12.7 oz (93.8 kg)   SpO2 92%   BMI 30.54 kg/m   Physical Exam Vitals signs reviewed.  Cardiovascular:     Rate and Rhythm: Normal rate and regular rhythm.     Heart sounds: Normal heart sounds.  Pulmonary:     Breath  sounds: Normal breath sounds.  Abdominal:     General: Bowel sounds are normal.  Musculoskeletal: Normal range of motion.  Skin:    General: Skin is warm and dry.  Neurological:     Mental Status: He is alert and oriented to person, place, and time.  Psychiatric:        Mood and Affect: Mood normal.        Behavior: Behavior normal.        Thought Content: Thought content normal.        Judgment: Judgment normal.     Imaging: Ct Abdomen Pelvis Wo Contrast  Result Date: 04/17/2019 CLINICAL DATA:  Inpatient. Gross hematuria with passage of clots. Acute renal failure. History of small cell lung cancer diagnosed March 2020 treated with chemotherapy. EXAM: CT ABDOMEN AND PELVIS WITHOUT CONTRAST TECHNIQUE: Multidetector CT imaging of the abdomen and pelvis was performed following the standard protocol without IV contrast. COMPARISON:  01/22/2019 PET-CT. FINDINGS: Lower chest: Trace dependent bilateral pleural effusions with mild dependent bibasilar atelectasis. Hepatobiliary: Normal liver size. No liver mass. Normal gallbladder with no radiopaque cholelithiasis. No biliary ductal dilatation. Pancreas: No pancreatic mass or duct dilation. There is mild haziness of the peripancreatic fat. There is new fat stranding and ill-defined fluid throughout the anterior paranephric spaces bilaterally extending into the pelvis. Spleen: Normal size. No mass.  Adrenals/Urinary Tract: Normal adrenals. No hydronephrosis. No renal stones. Normal caliber ureters. No ureteral stones. Several simple renal cysts scattered in the right kidney, largest 7.2 cm in posterior lower right kidney. No contour deforming left renal masses. Normal bladder. Stomach/Bowel: Small hiatal hernia. Otherwise normal nondistended stomach. Normal caliber small bowel with no small bowel wall thickening. Appendix not discretely visualized. Oral contrast transits to left colon. Partial visualization of a midline supraumbilical ventral hernia containing a small portion of the transverse colon, with no evidence of associated colonic wall thickening, pneumatosis or caliber transition. Small left inguinal hernia contains a small portion of the proximal sigmoid colon, with no associated colonic wall thickening, pneumatosis or caliber transition. No large bowel wall thickening, significant diverticulosis or significant pericolonic fat stranding. Vascular/Lymphatic: Atherosclerotic abdominal aorta with 3.2 cm infrarenal abdominal aortic aneurysm. No pathologically enlarged lymph nodes in the abdomen or pelvis. Reproductive: Top-normal size prostate. Other: No pneumoperitoneum, ascites or focal fluid collection. Musculoskeletal: No aggressive appearing focal osseous lesions. Moderate thoracolumbar spondylosis. IMPRESSION: 1. No urolithiasis. No hydronephrosis. No acute bladder abnormality. 2. Mild haziness of the peripancreatic fat. New fat stranding and ill-defined fluid throughout the anterior paranephric retroperitoneal spaces bilaterally extending into the pelvis. Acute pancreatitis cannot be excluded. Suggest correlation with serum lipase. No biliary or pancreatic duct dilation on this noncontrast scan. 3. Trace dependent bilateral pleural effusions. 4. Midline supraumbilical ventral hernia contains a portion of the transverse colon. Left inguinal hernia contains a portion of the sigmoid colon. No acute  bowel complication. 5. Small hiatal hernia. 6. Infrarenal 3.2 cm Abdominal Aortic Aneurysm (ICD10-I71.9). Recommend follow-up aortic ultrasound in 3 years. This recommendation follows ACR consensus guidelines: White Paper of the ACR Incidental Findings Committee II on Vascular Findings. J Am Coll Radiol 2013; 10:789-794. 7.  Aortic Atherosclerosis (ICD10-I70.0). Electronically Signed   By: Ilona Sorrel M.D.   On: 04/17/2019 16:09   Dg Chest 2 View  Result Date: 04/14/2019 CLINICAL DATA:  Anemia.  History of lung cancer. EXAM: CHEST - 2 VIEW COMPARISON:  Chest x-ray 01/24/2019 and chest CT 03/24/2019 FINDINGS: The cardiac silhouette, mediastinal and hilar contours are within normal  limits and stable. There is mild tortuosity of the thoracic aorta. Mild chronic emphysematous changes with areas of pulmonary scarring. No infiltrates, edema or effusions. No worrisome pulmonary lesions. IMPRESSION: Chronic emphysematous changes and pulmonary scarring but no definite acute overlying pulmonary process or new pulmonary lesions. Electronically Signed   By: Marijo Sanes M.D.   On: 04/14/2019 13:20   Ct Chest Wo Contrast  Result Date: 03/25/2019 CLINICAL DATA:  Limited stage small cell lung cancer EXAM: CT CHEST WITHOUT CONTRAST TECHNIQUE: Multidetector CT imaging of the chest was performed following the standard protocol without IV contrast. COMPARISON:  PET-CT dated 01/22/2019 FINDINGS: Cardiovascular: Heart is top-normal in size. No pericardial effusion. Stable focal aneurysmal dilatation anteriorly along the anterior aspect of the transverse aortic arch (series 2/image 50), measuring up to 4.7 cm, grossly unchanged. Atherosclerotic calcifications of the abdominal aorta and branch vessels. Coronary atherosclerosis of the LAD and left circumflex. Mediastinum/Nodes: No suspicious mediastinal lymphadenopathy. Lungs/Pleura: Prior perifissural nodule/mass anterior to the descending thoracic aorta has essentially resolved  (series 7/image 58). Minimal residual scarring/radiation changes. Otherwise, no suspicious pulmonary nodules. No focal consolidation. Moderate centrilobular and paraseptal emphysematous changes, upper lobe predominant. No pleural effusion or pneumothorax. Upper Abdomen: Visualized upper abdomen is grossly unremarkable, noting vascular calcifications. Musculoskeletal: Degenerative changes of the visualized thoracolumbar spine. IMPRESSION: Prior perifissural nodule/mass anterior to the descending thoracic aorta has essentially resolved. Mild residual scarring/radiation changes. No evidence of recurrent/metastatic disease. Aortic Atherosclerosis (ICD10-I70.0) and Emphysema (ICD10-J43.9). Electronically Signed   By: Julian Hy M.D.   On: 03/25/2019 08:49   US Renal  Result Date: 04/15/2019 CLINICAL DATA:  Acute renal disease.  Chronic renal disease. EXAM: RENAL / URINARY TRACT ULTRASOUND COMPLETE COMPARISON:  PET-CT 01/22/2019. FINDINGS: Right Kidney: Renal measurements: 12.8 x 6.0 x 6.1 cm = volume: 242.3 mL. Increased echogenicity. 8.8 cm simple cyst. 3.6 cm simple cyst. No hydronephrosis visualized. Left Kidney: Renal measurements: 12.9 x 6.1 x 5.3 cm = volume: 219.5 mL. Increased echogenicity. No mass or hydronephrosis visualized. Bladder: Appears normal for degree of bladder distention. IMPRESSION: Increased echogenicity both kidneys. This consistent chronic medical renal disease. Two simple cyst right kidney. No evidence of hydronephrosis or bladder distention. No acute abnormality identified. Electronically Signed   By: Marcello Moores  Register   On: 04/15/2019 11:33    Labs:  CBC: Recent Labs    04/16/19 0442 04/17/19 0424 04/18/19 0154 04/19/19 0303  WBC 11.4* 10.6* 10.7* 10.7*  HGB 8.2* 7.5* 7.1* 7.0*  HCT 24.2* 21.3* 19.8* 20.5*  PLT 45* 57* 72* 97*    COAGS: Recent Labs    01/24/19 0834 04/17/19 0951  INR 1.0 1.2  APTT 32  --     BMP: Recent Labs    04/16/19 0442  04/17/19 0424 04/18/19 0154 04/19/19 0303  NA 135 135 135 136  K 3.8 3.4* 3.4* 4.0  CL 106 99 101 104  CO2 15* 23 21* 20*  GLUCOSE 105* 124* 105* 95  BUN 79* 73* 69* 72*  CALCIUM 7.9* 7.4* 7.3* 7.6*  CREATININE 10.73* 11.70* 11.98* 13.15*  GFRNONAA 4* 4* 4* 3*  GFRAA 5* 4* 4* 4*    LIVER FUNCTION TESTS: Recent Labs    04/08/19 0946 04/14/19 0959 04/14/19 1247  04/16/19 0442 04/17/19 0424 04/18/19 0154 04/19/19 0303  BILITOT 0.4 0.3 0.3  --   --   --   --  0.5  AST 9* 11* 13*  --   --   --   --  16  ALT  12 9 12   --   --   --   --  13  ALKPHOS 113 97 90  --   --   --   --  70  PROT 6.3* 5.9* 6.3*  --   --   --   --  4.7*  ALBUMIN 2.6* 2.4* 2.7*   < > 2.3* 2.0* 1.8* 1.8*   < > = values in this interval not displayed.    TUMOR MARKERS: No results for input(s): AFPTM, CEA, CA199, CHROMGRNA in the last 8760 hours.  Assessment and Plan:  Anemia Thrombocytopenia Proteinuria Worsening /rising creatinine--- A/CKD Plan for poss random renal bx Mon June 8 Will check labs that am  Risks and benefits of random renal bx was discussed with the patient and/or patient's family including, but not limited to bleeding, infection, damage to adjacent structures or low yield requiring additional tests.  All of the questions were answered and there is agreement to proceed. Consent signed and in chart.  Thank you for this interesting consult.  I greatly enjoyed meeting Jeremy Johnson and look forward to participating in their care.  A copy of this report was sent to the requesting provider on this date.  Electronically Signed: Lavonia Drafts, PA-C 04/19/2019, 8:55 AM   I spent a total of 40 Minutes    in face to face in clinical consultation, greater than 50% of which was counseling/coordinating care for random renal bx

## 2019-04-20 LAB — CBC WITH DIFFERENTIAL/PLATELET
Abs Immature Granulocytes: 0.17 10*3/uL — ABNORMAL HIGH (ref 0.00–0.07)
Basophils Absolute: 0 10*3/uL (ref 0.0–0.1)
Basophils Relative: 0 %
Eosinophils Absolute: 0.1 10*3/uL (ref 0.0–0.5)
Eosinophils Relative: 1 %
HCT: 21.4 % — ABNORMAL LOW (ref 39.0–52.0)
Hemoglobin: 7.4 g/dL — ABNORMAL LOW (ref 13.0–17.0)
Immature Granulocytes: 1 %
Lymphocytes Relative: 11 %
Lymphs Abs: 1.3 10*3/uL (ref 0.7–4.0)
MCH: 30.2 pg (ref 26.0–34.0)
MCHC: 34.6 g/dL (ref 30.0–36.0)
MCV: 87.3 fL (ref 80.0–100.0)
Monocytes Absolute: 1.9 10*3/uL — ABNORMAL HIGH (ref 0.1–1.0)
Monocytes Relative: 15 %
Neutro Abs: 8.6 10*3/uL — ABNORMAL HIGH (ref 1.7–7.7)
Neutrophils Relative %: 72 %
Platelets: 133 10*3/uL — ABNORMAL LOW (ref 150–400)
RBC: 2.45 MIL/uL — ABNORMAL LOW (ref 4.22–5.81)
RDW: 15.3 % (ref 11.5–15.5)
WBC: 12.1 10*3/uL — ABNORMAL HIGH (ref 4.0–10.5)
nRBC: 0 % (ref 0.0–0.2)

## 2019-04-20 LAB — COMPREHENSIVE METABOLIC PANEL
ALT: 17 U/L (ref 0–44)
AST: 20 U/L (ref 15–41)
Albumin: 1.8 g/dL — ABNORMAL LOW (ref 3.5–5.0)
Alkaline Phosphatase: 74 U/L (ref 38–126)
Anion gap: 16 — ABNORMAL HIGH (ref 5–15)
BUN: 74 mg/dL — ABNORMAL HIGH (ref 8–23)
CO2: 19 mmol/L — ABNORMAL LOW (ref 22–32)
Calcium: 7.8 mg/dL — ABNORMAL LOW (ref 8.9–10.3)
Chloride: 102 mmol/L (ref 98–111)
Creatinine, Ser: 13.78 mg/dL — ABNORMAL HIGH (ref 0.61–1.24)
GFR calc Af Amer: 4 mL/min — ABNORMAL LOW (ref >60)
GFR calc non Af Amer: 3 mL/min — ABNORMAL LOW (ref >60)
Glucose, Bld: 101 mg/dL — ABNORMAL HIGH (ref 70–99)
Potassium: 3.6 mmol/L (ref 3.5–5.1)
Sodium: 137 mmol/L (ref 135–145)
Total Bilirubin: 0.3 mg/dL (ref 0.3–1.2)
Total Protein: 4.9 g/dL — ABNORMAL LOW (ref 6.5–8.1)

## 2019-04-20 LAB — MAGNESIUM: Magnesium: 1.7 mg/dL (ref 1.7–2.4)

## 2019-04-20 LAB — PHOSPHORUS: Phosphorus: 7.2 mg/dL — ABNORMAL HIGH (ref 2.5–4.6)

## 2019-04-20 NOTE — Progress Notes (Signed)
PROGRESS NOTE    Jeremy Johnson  KDT:[id number] DOB: [birth year] DOA: 04/14/2019 PCP: [name], MD   Brief Narrative:  73 year old with history of limited stage small cell lung cancer, anemia of chronic disease, CKD stage III, essential hypertension, hypothyroidism, hyperlipidemia, coronary artery disease sent to the hospital by oncologist for evaluation of acute kidney injury 9.5 Hemoglobin of 6.9.  Patient was transferred to [facility] for possible renal biopsy and initiation of dialysis.  04/20/2019: Patient seen.  Available records reviewed.  Nephrology input is appreciated.  BUN is 74 and serum creatinine is 13.78 today.  Kidney biopsy is planned for tomorrow.  Otherwise, no new complaints.   Assessment & Plan:   Principal Problem:   Acute renal failure (ARF) (HCC) Active Problems:   Coronary atherosclerosis   Chronic kidney disease   GERD (gastroesophageal reflux disease)   CAD (coronary artery disease)   Hyperlipidemia   Hypertension   Gout   Hypothyroidism   Small cell lung cancer (HCC)   Anemia in chronic kidney disease (CKD)   Metabolic acidosis   Acute renal failure superimposed on stage 3 chronic kidney disease (HCC)   Small cell lung cancer, right upper lobe (HCC)  Acute on chronic kidney disease, stage III Acute metabolic acidosis secondary to renal dysfunction -Baseline creatinine 2.2.  Admission creatinine 9.52.  Creatinine today is 11.7 -Renal ultrasound-negative for hydronephrosis.  Inflammatory markers elevated - Concern secondary to carboplatin, losartan/hydrochlorothiazide.  Last chemo 5/18 -Patient transferred here for consideration of renal biopsy and initiation of hemodialysis; but this this does not appear to be emergent because of good urine output and stable electrolytes. -Currently patient's bicarb and potassium appears to be stable with decent urine output. 04/20/2019: Definitive etiology of her acute kidney injury remains uncertain.  For  renal biopsy tomorrow.  No indication for emergent renal replacement therapy.  Nephrology input is highly appreciated.  Acute anemia of chronic disease, hemoglobin of 6.9 Thrombocytopenia - Likely secondary to chemotherapy.  Status post units of PRBC transfusion. Hb drifted down to 7.1 -No obvious evidence of bleeding except slight hematuria.  Monitor 04/20/2019: Anemia and thrombocytopenia likely multifactorial.  Patient is currently undergoing chemotherapy for lung cancer.  Patient also has chronic kidney disease, with recent acute kidney injury.  Recent hematuria was also reported.  Monitor closely, and transfuse packed red blood cells as deemed necessary.  Hemoglobin today 7.4 g/dL.  Hematuria -Etiology is unclear.  Possibly secondary to thrombocytopenia.   -Case was discussed with urology by the previous provider, recommending CT of the abdomen pelvis.  CT abdomen pelvis showed possible pancreatitis, trace bilateral pleural effusion, midline supraumbilical ventral hernia, 3.2 cm AAA.  Coronary artery disease As patient is currently chest pain-free.  Aspirin on hold due to thrombocytopenia.  Continue metoprolol and statin  Essential hypertension - Holding nephrotoxic medication including losartan hydrochlorothiazide.  Continue metoprolol  Limited stage small cell lung cancer - Last chemo 5/18.  Last radiation 03/17/19.  Initial diagnosis March 2020.  Follow oncology recommendations  DVT prophylaxis: SCDs Code Status: Full code Family Communication: None at bedside.  Offered to speak with this family but states no for now Disposition Plan: Maintain hospital stay until renal function has improved and stabilized  Consultants:   Oncology  Renal  Procedures:   None  Antimicrobials:   None   Subjective: Patient seen. No new complaints. No fever or chills. No shortness of breath.  Objective: Vitals:   04/19/19 2054 04/20/19 0500 04/20/19 0520 04/20/19 0858  BP: [name])  153/89   138/67 (!) 146/75  Pulse: 86  82 77  Resp: 19  20 18   Temp: 98.5 F (36.9 C)  98.8 F (37.1 C) 98.5 F (36.9 C)  TempSrc: Oral  Oral Oral  SpO2: 95%  93% 94%  Weight: 94.3 kg 94.3 kg    Height:        Intake/Output Summary (Last 24 hours) at 04/20/2019 1347 Last data filed at 04/20/2019 1309 Gross per 24 hour  Intake 890 ml  Output 2500 ml  Net -1610 ml   Filed Weights   04/19/19 0500 04/19/19 2054 04/20/19 0500  Weight: 93.8 kg 94.3 kg 94.3 kg    Examination:  General exam: Appears calm and comfortable  Respiratory system: Adequate air entry. Cardiovascular system: S1 & S2 Gastrointestinal system: Abdomen is soft and nontender. No organomegaly or masses felt. Normal bowel sounds heard. Central nervous system: Alert and oriented.  Is all extremities. Extremities: Minimal leg edema  Data Reviewed:   CBC: Recent Labs  Lab 04/14/19 0959 04/14/19 1247  04/16/19 0442 04/17/19 0424 04/18/19 0154 04/19/19 0303 04/20/19 0449  WBC 11.5* 12.2*   < > 11.4* 10.6* 10.7* 10.7* 12.1*  NEUTROABS 8.6* 8.9*  --   --   --  7.8*  --  8.6*  HGB 6.8* 6.9*   < > 8.2* 7.5* 7.1* 7.0* 7.4*  HCT 20.3* 19.9*   < > 24.2* 21.3* 19.8* 20.5* 21.4*  MCV 92.7 91.7   < > 88.3 86.6 85.7 87.6 87.3  PLT 34* 32*   < > 45* 57* 72* 97* 133*   < > = values in this interval not displayed.   Basic Metabolic Panel: Recent Labs  Lab 04/15/19 0055 04/16/19 0442 04/17/19 0424 04/18/19 0154 04/19/19 0303 04/20/19 0449  NA 136 135 135 135 136 137  K 4.9 3.8 3.4* 3.4* 4.0 3.6  CL 109 106 99 101 104 102  CO2 14* 15* 23 21* 20* 19*  GLUCOSE 103* 105* 124* 105* 95 101*  BUN 82* 79* 73* 69* 72* 74*  CREATININE 10.18* 10.73* 11.70* 11.98* 13.15* 13.78*  CALCIUM 7.4* 7.9* 7.4* 7.3* 7.6* 7.8*  MG 1.4* 1.7 1.5* 1.8 1.7 1.7  PHOS 6.4* 7.1* 7.2* 7.5*  --  7.2*   GFR: Estimated Creatinine Clearance: 5.5 mL/min (A) (by C-G formula based on SCr of 13.78 mg/dL (H)). Liver Function Tests: Recent Labs  Lab  04/14/19 0959 04/14/19 1247  04/16/19 0442 04/17/19 0424 04/18/19 0154 04/19/19 0303 04/20/19 0449  AST 11* 13*  --   --   --   --  16 20  ALT 9 12  --   --   --   --  13 17  ALKPHOS 97 90  --   --   --   --  70 74  BILITOT 0.3 0.3  --   --   --   --  0.5 0.3  PROT 5.9* 6.3*  --   --   --   --  4.7* 4.9*  ALBUMIN 2.4* 2.7*   < > 2.3* 2.0* 1.8* 1.8* 1.8*   < > = values in this interval not displayed.   Recent Labs  Lab 04/18/19 0154  LIPASE 22   No results for input(s): AMMONIA in the last 168 hours. Coagulation Profile: Recent Labs  Lab 04/17/19 0951  INR 1.2   Cardiac Enzymes: No results for input(s): CKTOTAL, CKMB, CKMBINDEX, TROPONINI in the last 168 hours. BNP (last 3 results) No results for  input(s): PROBNP in the last 8760 hours. HbA1C: No results for input(s): HGBA1C in the last 72 hours. CBG: No results for input(s): GLUCAP in the last 168 hours. Lipid Profile: No results for input(s): CHOL, HDL, LDLCALC, TRIG, CHOLHDL, LDLDIRECT in the last 72 hours. Thyroid Function Tests: No results for input(s): TSH, T4TOTAL, FREET4, T3FREE, THYROIDAB in the last 72 hours. Anemia Panel: No results for input(s): VITAMINB12, FOLATE, FERRITIN, TIBC, IRON, RETICCTPCT in the last 72 hours. Sepsis Labs: No results for input(s): PROCALCITON, LATICACIDVEN in the last 168 hours.  Recent Results (from the past 240 hour(s))  SARS Coronavirus 2 (CEPHEID - Performed in [place] [facility] hospital lab), Hosp Order     Status: None   Collection Time: 04/14/19  2:13 PM  Result Value Ref Range Status   SARS Coronavirus 2 NEGATIVE NEGATIVE Final    Comment: (NOTE) If result is NEGATIVE SARS-CoV-2 target nucleic acids are NOT DETECTED. The SARS-CoV-2 RNA is generally detectable in upper and lower  respiratory specimens during the acute phase of infection. The lowest  concentration of SARS-CoV-2 viral copies this assay can detect is 250  copies / mL. A negative result does not preclude  SARS-CoV-2 infection  and should not be used as the sole basis for treatment or other  patient management decisions.  A negative result may occur with  improper specimen collection / handling, submission of specimen other  than nasopharyngeal swab, presence of viral mutation(s) within the  areas targeted by this assay, and inadequate number of viral copies  (<250 copies / mL). A negative result must be combined with clinical  observations, patient history, and epidemiological information. If result is POSITIVE SARS-CoV-2 target nucleic acids are DETECTED. The SARS-CoV-2 RNA is generally detectable in upper and lower  respiratory specimens dur ing the acute phase of infection.  Positive  results are indicative of active infection with SARS-CoV-2.  Clinical  correlation with patient history and other diagnostic information is  necessary to determine patient infection status.  Positive results do  not rule out bacterial infection or co-infection with other viruses. If result is PRESUMPTIVE POSTIVE SARS-CoV-2 nucleic acids MAY BE PRESENT.   A presumptive positive result was obtained on the submitted specimen  and confirmed on repeat testing.  While 2019 novel coronavirus  (SARS-CoV-2) nucleic acids may be present in the submitted sample  additional confirmatory testing may be necessary for epidemiological  and / or clinical management purposes  to differentiate between  SARS-CoV-2 and other Sarbecovirus currently known to infect humans.  If clinically indicated additional testing with an alternate test  methodology [phone]) is advised. The SARS-CoV-2 RNA is generally  detectable in upper and lower respiratory sp ecimens during the acute  phase of infection. The expected result is Negative. Fact Sheet for Patients:  StrictlyIdeas.no Fact Sheet for Healthcare Providers: BankingDealers.co.za This test is not yet approved or cleared by the  [place] FDA and has been authorized for detection and/or diagnosis of SARS-CoV-2 by FDA under an Emergency Use Authorization (EUA).  This EUA will remain in effect (meaning this test can be used) for the duration of the COVID-19 declaration under Section 564(b)(1) of the Act, 21 U.S.C. section 360bbb-3(b)(1), unless the authorization is terminated or revoked sooner. Performed at [facility], [address] [place], [place] [address]          Radiology Studies: No results found.      Scheduled Meds: . calcitRIOL  0.25 mcg Oral Daily  . calcium acetate  667  mg Oral TID WC  . levothyroxine  175 mcg Oral QAC breakfast  . metoprolol tartrate  12.5 mg Oral BID  . pantoprazole  40 mg Oral Daily  . simvastatin  10 mg Oral q1800  . sucralfate  1 g Oral TID WC & HS   Continuous Infusions:    LOS: 6 days   Time spent= 35 mins    Bonnell Public, MD [facility]  If 7PM-7AM, please contact night-coverage [email or web address] 04/20/2019, 1:47 PM

## 2019-04-20 NOTE — Progress Notes (Signed)
[facility] NEPHROLOGY PROGRESS NOTE  Assessment/ Plan: Pt is a 73 y.o. yo male with  HTN, HLD, AAA s/p repair in 2006 causing damage to the kidney and then CKD with baseline creatinine around 2.5 per patient, recently dx with SCC of lung s/p 3 cycles of carboplatin and etoposide and RT, last chemo on 5/18,  sent to the hospital for abnormal labs including hemoglobin 6.9 and serum creatinine level 9.86.    #Acute kidney injury on CKD - on 5/11 crt was 2.79, 5/18 3.37 then just worse from there.   possibly ATN due to carboplatin, losartan/HCTZ and anemia: Other differential could be glomerulonephritis due to Neulasta vs membranous GN due to cancer vs rare possibility of chemo induced TMA. Urinalysis with proteinuria- over 11 grams and hematuria. -Patient is asymptomatic and had good urine output despite of worsening serum creatinine level to 13 today. Rate of rise is less ? No absolute HD indications but starting to get to an uncomfortable range   serologies- c3, c4 WNL, ANCA neg , ANA negative , antiGBM ab negative , hep B, C, HIV all negative , K/l ratio is 1.45. -  Haptoglobin high , INR and LDH slightly high. No mention of schistocytes in CBC differential.   -US renal with increased echogenicity consistent with CKD. He will probably need kidney biopsy for the definitive diagnosis. IR want plts over 100 - biopsy tentatively planned for Monday- will fill out bx form   #Metabolic acidosis due to renal failure: Improved.  #Hypomagnesemia: Due to carboplatin.  Replete magnesium sulfate.  1.7 today   # Hypokalemia: replete KCL. Is fine today   #Hypertension: Monitor BP. Continue metoprolol. Seems fine.  Stop IVF   #Anemia due to chemotherapy: Received 2 units of PRBC transfusion on 04/14/2019.  Monitor CBC. Drifting down again   #Thrombocytopenia: Due to chemo.  No sign of bleeding. Inc nicely    #Small cell lung cancer: On chemotherapy.  Per oncology.  Hyperphosphatemia-   Started on phoslo    Subjective:  1900 of UOP but crt up- pt continues to look clinically well - probably some uremic sxms-  Feels nauseated when smells food  Objective Vital signs in last 24 hours: Vitals:   04/19/19 2054 04/20/19 0500 04/20/19 0520 04/20/19 0858  BP: (!) 153/89  138/67 (!) 146/75  Pulse: 86  82 77  Resp: 19  20 18   Temp: 98.5 F (36.9 C)  98.8 F (37.1 C) 98.5 F (36.9 C)  TempSrc: Oral  Oral Oral  SpO2: 95%  93% 94%  Weight: 94.3 kg 94.3 kg    Height:       Weight change: 0.5 kg  Intake/Output Summary (Last 24 hours) at 04/20/2019 1047 Last data filed at 04/20/2019 0938 Gross per 24 hour  Intake 670 ml  Output 2300 ml  Net -1630 ml       Labs: Basic Metabolic Panel: Recent Labs  Lab 04/17/19 0424 04/18/19 0154 04/19/19 0303 04/20/19 0449  NA 135 135 136 137  K 3.4* 3.4* 4.0 3.6  CL 99 101 104 102  CO2 23 21* 20* 19*  GLUCOSE 124* 105* 95 101*  BUN 73* 69* 72* 74*  CREATININE 11.70* 11.98* 13.15* 13.78*  CALCIUM 7.4* 7.3* 7.6* 7.8*  PHOS 7.2* 7.5*  --  7.2*   Liver Function Tests: Recent Labs  Lab 04/14/19 1247  04/18/19 0154 04/19/19 0303 04/20/19 0449  AST 13*  --   --  16 20  ALT 12  --   --  13 17  ALKPHOS 90  --   --  70 74  BILITOT 0.3  --   --  0.5 0.3  PROT 6.3*  --   --  4.7* 4.9*  ALBUMIN 2.7*   < > 1.8* 1.8* 1.8*   < > = values in this interval not displayed.   Recent Labs  Lab 04/18/19 0154  LIPASE 22   No results for input(s): AMMONIA in the last 168 hours. CBC: Recent Labs  Lab 04/14/19 1247  04/16/19 0442 04/17/19 0424 04/18/19 0154 04/19/19 0303 04/20/19 0449  WBC 12.2*   < > 11.4* 10.6* 10.7* 10.7* 12.1*  NEUTROABS 8.9*  --   --   --  7.8*  --  8.6*  HGB 6.9*   < > 8.2* 7.5* 7.1* 7.0* 7.4*  HCT 19.9*   < > 24.2* 21.3* 19.8* 20.5* 21.4*  MCV 91.7   < > 88.3 86.6 85.7 87.6 87.3  PLT 32*   < > 45* 57* 72* 97* 133*   < > = values in this interval not displayed.   Cardiac Enzymes: No results for  input(s): CKTOTAL, CKMB, CKMBINDEX, TROPONINI in the last 168 hours. CBG: No results for input(s): GLUCAP in the last 168 hours.  Iron Studies: No results for input(s): IRON, TIBC, TRANSFERRIN, FERRITIN in the last 72 hours. Studies/Results: No results found.  Medications: Infusions:   Scheduled Medications: . calcitRIOL  0.25 mcg Oral Daily  . calcium acetate  667 mg Oral TID WC  . levothyroxine  175 mcg Oral QAC breakfast  . metoprolol tartrate  12.5 mg Oral BID  . pantoprazole  40 mg Oral Daily  . simvastatin  10 mg Oral q1800  . sucralfate  1 g Oral TID WC & HS    have reviewed scheduled and prn medications.  Physical Exam: General: Not in distress, comfortable Heart:RRR, s1s2 nl no rubs Lungs: Bilateral, no wheezing or crackle Abdomen:soft, Non-tender, non-distended Extremities: No edema Neurology: Alert, awake, nonfocal, no asterixis Skin: No rash or ulcer  Neysa Arts A Jermiah Soderman 04/20/2019,10:47 AM  LOS: 6 days

## 2019-04-21 ENCOUNTER — Other Ambulatory Visit (HOSPITAL_COMMUNITY): Payer: Self-pay | Admitting: Interventional Radiology

## 2019-04-21 ENCOUNTER — Ambulatory Visit: Payer: Medicare Other

## 2019-04-21 ENCOUNTER — Other Ambulatory Visit: Payer: Medicare Other

## 2019-04-21 ENCOUNTER — Ambulatory Visit: Payer: Medicare Other | Admitting: Physician Assistant

## 2019-04-21 ENCOUNTER — Inpatient Hospital Stay (HOSPITAL_COMMUNITY): Payer: Medicare Other

## 2019-04-21 LAB — CBC
HCT: 21.1 % — ABNORMAL LOW (ref 39.0–52.0)
Hemoglobin: 7.3 g/dL — ABNORMAL LOW (ref 13.0–17.0)
MCH: 30.3 pg (ref 26.0–34.0)
MCHC: 34.6 g/dL (ref 30.0–36.0)
MCV: 87.6 fL (ref 80.0–100.0)
Platelets: 165 10*3/uL (ref 150–400)
RBC: 2.41 MIL/uL — ABNORMAL LOW (ref 4.22–5.81)
RDW: 15.3 % (ref 11.5–15.5)
WBC: 12.1 10*3/uL — ABNORMAL HIGH (ref 4.0–10.5)
nRBC: 0 % (ref 0.0–0.2)

## 2019-04-21 LAB — COMPREHENSIVE METABOLIC PANEL
ALT: 18 U/L (ref 0–44)
AST: 21 U/L (ref 15–41)
Albumin: 1.7 g/dL — ABNORMAL LOW (ref 3.5–5.0)
Alkaline Phosphatase: 76 U/L (ref 38–126)
Anion gap: 14 (ref 5–15)
BUN: 79 mg/dL — ABNORMAL HIGH (ref 8–23)
CO2: 19 mmol/L — ABNORMAL LOW (ref 22–32)
Calcium: 7.8 mg/dL — ABNORMAL LOW (ref 8.9–10.3)
Chloride: 102 mmol/L (ref 98–111)
Creatinine, Ser: 14.89 mg/dL — ABNORMAL HIGH (ref 0.61–1.24)
GFR calc Af Amer: 3 mL/min — ABNORMAL LOW (ref >60)
GFR calc non Af Amer: 3 mL/min — ABNORMAL LOW (ref >60)
Glucose, Bld: 94 mg/dL (ref 70–99)
Potassium: 3.9 mmol/L (ref 3.5–5.1)
Sodium: 135 mmol/L (ref 135–145)
Total Bilirubin: 0.4 mg/dL (ref 0.3–1.2)
Total Protein: 5 g/dL — ABNORMAL LOW (ref 6.5–8.1)

## 2019-04-21 LAB — PHOSPHORUS: Phosphorus: 8.5 mg/dL — ABNORMAL HIGH (ref 2.5–4.6)

## 2019-04-21 LAB — MAGNESIUM: Magnesium: 1.7 mg/dL (ref 1.7–2.4)

## 2019-04-21 MED ORDER — MIDAZOLAM HCL 2 MG/2ML IJ SOLN
INTRAMUSCULAR | Status: AC
  Filled 2019-04-21: qty 2

## 2019-04-21 MED ORDER — LIDOCAINE HCL (PF) 1 % IJ SOLN
INTRAMUSCULAR | Status: AC
  Filled 2019-04-21: qty 30

## 2019-04-21 MED ORDER — SODIUM CHLORIDE 0.9 % IV SOLN
INTRAVENOUS | Status: AC | PRN
  Administered 2019-04-21: 10 mL/h via INTRAVENOUS

## 2019-04-21 MED ORDER — CHLORHEXIDINE GLUCONATE CLOTH 2 % EX PADS
6.0000 | MEDICATED_PAD | Freq: Every day | CUTANEOUS | Status: DC
  Administered 2019-04-22 – 2019-04-26 (×5): 6 via TOPICAL

## 2019-04-21 MED ORDER — MIDAZOLAM HCL 2 MG/2ML IJ SOLN
INTRAMUSCULAR | Status: AC | PRN
  Administered 2019-04-21 (×2): 1 mg via INTRAVENOUS

## 2019-04-21 MED ORDER — FENTANYL CITRATE (PF) 100 MCG/2ML IJ SOLN
INTRAMUSCULAR | Status: AC | PRN
  Administered 2019-04-21: 50 ug via INTRAVENOUS

## 2019-04-21 MED ORDER — FENTANYL CITRATE (PF) 100 MCG/2ML IJ SOLN
INTRAMUSCULAR | Status: AC
  Filled 2019-04-21: qty 2

## 2019-04-21 MED ORDER — GELATIN ABSORBABLE 12-7 MM EX MISC
CUTANEOUS | Status: AC
  Filled 2019-04-21: qty 1

## 2019-04-21 NOTE — Progress Notes (Signed)
PROGRESS NOTE    Jeremy Johnson  PPJ:[id number] DOB: [birth year] DOA: 04/14/2019 PCP: [name], MD   Brief Narrative:  73 year old with history of limited stage small cell lung cancer, anemia of chronic disease, CKD stage III, essential hypertension, hypothyroidism, hyperlipidemia, coronary artery disease sent to the hospital by oncologist for evaluation of acute kidney injury 9.5 Hemoglobin of 6.9.  Patient was transferred to [facility] for possible renal biopsy and initiation of dialysis.  04/20/2019: Patient seen.  Available records reviewed.  Nephrology input is appreciated.  BUN is 74 and serum creatinine is 13.78 today.  Kidney biopsy is planned for tomorrow.  Otherwise, no new complaints.  04/21/2019: Patient underwent renal biopsy earlier today.  Renal function continues to worsen.  Plan as per nephrology team is to place hemodialysis catheter.  Nephrology is directing care.  Assessment & Plan:   Principal Problem:   Acute renal failure (ARF) (HCC) Active Problems:   Coronary atherosclerosis   Chronic kidney disease   GERD (gastroesophageal reflux disease)   CAD (coronary artery disease)   Hyperlipidemia   Hypertension   Gout   Hypothyroidism   Small cell lung cancer (HCC)   Anemia in chronic kidney disease (CKD)   Metabolic acidosis   Acute renal failure superimposed on stage 3 chronic kidney disease (HCC)   Small cell lung cancer, right upper lobe (HCC)  Acute on chronic kidney disease, stage III Acute metabolic acidosis secondary to renal dysfunction -Baseline creatinine 2.2.  Admission creatinine 9.52.  Creatinine today is 11.7 -Renal ultrasound-negative for hydronephrosis.  Inflammatory markers elevated - Concern secondary to carboplatin, losartan/hydrochlorothiazide.  Last chemo 5/18 -Patient transferred here for consideration of renal biopsy and initiation of hemodialysis; but this this does not appear to be emergent because of good urine output and stable  electrolytes. -Currently patient's bicarb and potassium appears to be stable with decent urine output. 04/20/2019: Definitive etiology of her acute kidney injury remains uncertain.  For renal biopsy tomorrow.  No indication for emergent renal replacement therapy.  Nephrology input is highly appreciated. -04/21/2019: Patient underwent renal biopsy earlier today.  Renal function continues to worsen.  Plan as per nephrology team is to place hemodialysis catheter.  Nephrology is directing care.  Acute anemia of chronic disease, hemoglobin of 6.9 Thrombocytopenia - Likely secondary to chemotherapy.  Status post units of PRBC transfusion. Hb drifted down to 7.1 -No obvious evidence of bleeding except slight hematuria.  Monitor 04/20/2019: Anemia and thrombocytopenia likely multifactorial.  Patient is currently undergoing chemotherapy for lung cancer.  Patient also has chronic kidney disease, with recent acute kidney injury.  Recent hematuria was also reported.  Monitor closely, and transfuse packed red blood cells as deemed necessary.  Hemoglobin today 7.4 g/dL. 04/21/2019: Hemoglobin is stable.  Continue to monitor.  Hematuria -Etiology is unclear.  Possibly secondary to thrombocytopenia.   -Case was discussed with urology by the previous provider, recommending CT of the abdomen pelvis.  CT abdomen pelvis showed possible pancreatitis, trace bilateral pleural effusion, midline supraumbilical ventral hernia, 3.2 cm AAA.  Coronary artery disease As patient is currently chest pain-free.  Aspirin on hold due to thrombocytopenia.  Continue metoprolol and statin  Essential hypertension - Holding nephrotoxic medication including losartan hydrochlorothiazide.  Continue metoprolol  Limited stage small cell lung cancer - Last chemo 5/18.  Last radiation 03/17/19.  Initial diagnosis March 2020.  Follow oncology recommendations  DVT prophylaxis: SCDs Code Status: Full code Family Communication: None at bedside.   Offered to speak with  this family but states no for now Disposition Plan: Maintain hospital stay until renal function has improved and stabilized  Consultants:   Oncology  Renal  Procedures:   None  Antimicrobials:   None   Subjective: Patient seen. No new complaints. No fever or chills. No shortness of breath.  Objective: Vitals:   04/21/19 0845 04/21/19 0855 04/21/19 0900 04/21/19 0932  BP: (!) 144/81 (!) 157/89 (!) 153/86 134/77  Pulse: 92 95 92 89  Resp: (!) 22 18 20 18   Temp:    98.2 F (36.8 C)  TempSrc:    Oral  SpO2: 97% 99% 98% 95%  Weight:      Height:        Intake/Output Summary (Last 24 hours) at 04/21/2019 1335 Last data filed at 04/21/2019 1248 Gross per 24 hour  Intake 240 ml  Output 2450 ml  Net -2210 ml   Filed Weights   04/20/19 0500 04/20/19 2122 04/21/19 0317  Weight: 94.3 kg 92.4 kg 92.4 kg    Examination:  General exam: Appears calm and comfortable  Respiratory system: Adequate air entry. Cardiovascular system: S1 & S2 Gastrointestinal system: Abdomen is soft and nontender. No organomegaly or masses felt. Normal bowel sounds heard. Central nervous system: Alert and oriented.  Is all extremities. Extremities: Minimal leg edema  Data Reviewed:   CBC: Recent Labs  Lab 04/17/19 0424 04/18/19 0154 04/19/19 0303 04/20/19 0449 04/21/19 0541  WBC 10.6* 10.7* 10.7* 12.1* 12.1*  NEUTROABS  --  7.8*  --  8.6*  --   HGB 7.5* 7.1* 7.0* 7.4* 7.3*  HCT 21.3* 19.8* 20.5* 21.4* 21.1*  MCV 86.6 85.7 87.6 87.3 87.6  PLT 57* 72* 97* 133* 188   Basic Metabolic Panel: Recent Labs  Lab 04/16/19 0442 04/17/19 0424 04/18/19 0154 04/19/19 0303 04/20/19 0449 04/21/19 0541  NA 135 135 135 136 137 135  K 3.8 3.4* 3.4* 4.0 3.6 3.9  CL 106 99 101 104 102 102  CO2 15* 23 21* 20* 19* 19*  GLUCOSE 105* 124* 105* 95 101* 94  BUN 79* 73* 69* 72* 74* 79*  CREATININE 10.73* 11.70* 11.98* 13.15* 13.78* 14.89*  CALCIUM 7.9* 7.4* 7.3* 7.6* 7.8*  7.8*  MG 1.7 1.5* 1.8 1.7 1.7 1.7  PHOS 7.1* 7.2* 7.5*  --  7.2* 8.5*   GFR: Estimated Creatinine Clearance: 5 mL/min (A) (by C-G formula based on SCr of 14.89 mg/dL (H)). Liver Function Tests: Recent Labs  Lab 04/17/19 0424 04/18/19 0154 04/19/19 0303 04/20/19 0449 04/21/19 0541  AST  --   --  16 20 21   ALT  --   --  13 17 18   ALKPHOS  --   --  70 74 76  BILITOT  --   --  0.5 0.3 0.4  PROT  --   --  4.7* 4.9* 5.0*  ALBUMIN 2.0* 1.8* 1.8* 1.8* 1.7*   Recent Labs  Lab 04/18/19 0154  LIPASE 22   No results for input(s): AMMONIA in the last 168 hours. Coagulation Profile: Recent Labs  Lab 04/17/19 0951  INR 1.2   Cardiac Enzymes: No results for input(s): CKTOTAL, CKMB, CKMBINDEX, TROPONINI in the last 168 hours. BNP (last 3 results) No results for input(s): PROBNP in the last 8760 hours. HbA1C: No results for input(s): HGBA1C in the last 72 hours. CBG: No results for input(s): GLUCAP in the last 168 hours. Lipid Profile: No results for input(s): CHOL, HDL, LDLCALC, TRIG, CHOLHDL, LDLDIRECT in the last 72 hours. Thyroid  Function Tests: No results for input(s): TSH, T4TOTAL, FREET4, T3FREE, THYROIDAB in the last 72 hours. Anemia Panel: No results for input(s): VITAMINB12, FOLATE, FERRITIN, TIBC, IRON, RETICCTPCT in the last 72 hours. Sepsis Labs: No results for input(s): PROCALCITON, LATICACIDVEN in the last 168 hours.  Recent Results (from the past 240 hour(s))  SARS Coronavirus 2 (CEPHEID - Performed in [facility] hospital lab), Hosp Order     Status: None   Collection Time: 04/14/19  2:13 PM  Result Value Ref Range Status   SARS Coronavirus 2 NEGATIVE NEGATIVE Final    Comment: (NOTE) If result is NEGATIVE SARS-CoV-2 target nucleic acids are NOT DETECTED. The SARS-CoV-2 RNA is generally detectable in upper and lower  respiratory specimens during the acute phase of infection. The lowest  concentration of SARS-CoV-2 viral copies this assay can detect is 250   copies / mL. A negative result does not preclude SARS-CoV-2 infection  and should not be used as the sole basis for treatment or other  patient management decisions.  A negative result may occur with  improper specimen collection / handling, submission of specimen other  than nasopharyngeal swab, presence of viral mutation(s) within the  areas targeted by this assay, and inadequate number of viral copies  (<250 copies / mL). A negative result must be combined with clinical  observations, patient history, and epidemiological information. If result is POSITIVE SARS-CoV-2 target nucleic acids are DETECTED. The SARS-CoV-2 RNA is generally detectable in upper and lower  respiratory specimens dur ing the acute phase of infection.  Positive  results are indicative of active infection with SARS-CoV-2.  Clinical  correlation with patient history and other diagnostic information is  necessary to determine patient infection status.  Positive results do  not rule out bacterial infection or co-infection with other viruses. If result is PRESUMPTIVE POSTIVE SARS-CoV-2 nucleic acids MAY BE PRESENT.   A presumptive positive result was obtained on the submitted specimen  and confirmed on repeat testing.  While 2019 novel coronavirus  (SARS-CoV-2) nucleic acids may be present in the submitted sample  additional confirmatory testing may be necessary for epidemiological  and / or clinical management purposes  to differentiate between  SARS-CoV-2 and other Sarbecovirus currently known to infect humans.  If clinically indicated additional testing with an alternate test  methodology [phone]) is advised. The SARS-CoV-2 RNA is generally  detectable in upper and lower respiratory sp ecimens during the acute  phase of infection. The expected result is Negative. Fact Sheet for Patients:  StrictlyIdeas.no Fact Sheet for Healthcare  Providers: BankingDealers.co.za This test is not yet approved or cleared by the [place] FDA and has been authorized for detection and/or diagnosis of SARS-CoV-2 by FDA under an Emergency Use Authorization (EUA).  This EUA will remain in effect (meaning this test can be used) for the duration of the COVID-19 declaration under Section 564(b)(1) of the Act, 21 U.S.C. section 360bbb-3(b)(1), unless the authorization is terminated or revoked sooner. Performed at [facility], [address] [place], [place] [address]          Radiology Studies: US Biopsy (kidney)  Result Date: 04/21/2019 INDICATION: Acute kidney injury EXAM: ULTRASOUND GUIDED CORE BIOPSY OF LEFT KIDNEY MEDICATIONS: 1% LIDOCAINE LOCAL ANESTHESIA/SEDATION: Versed 2.0mg  IV; Fentanyl 20mcg IV; Moderate Sedation Time:  11 minutes The patient was continuously monitored during the procedure by the interventional radiology nurse under my direct supervision. FLUOROSCOPY TIME:  Fluoroscopy Time: None. COMPLICATIONS: None immediate. PROCEDURE: The procedure, risks, benefits, and alternatives were  explained to the patient. Questions regarding the procedure were encouraged and answered. The patient understands and consents to the procedure. The left flank was prepped with ChloraPrep in a sterile fashion, and a sterile drape was applied covering the operative field. A sterile gown and sterile gloves were used for the procedure. Local anesthesia was provided with 1% Lidocaine. Patient positioned prone. Preliminary ultrasound performed. The left kidney lower pole was localized. Overlying skin marked. Under sterile conditions and local anesthesia, a 15 gauge coaxial guide needle was advanced to the left kidney lower pole. Needle position confirmed with ultrasound. 2 16 gauge core biopsies obtained of the cortex. Needle position confirmed with ultrasound. Images obtained for documentation. Needle tract occluded with Gel-Foam.  Patient tolerated the procedure well. FINDINGS: Imaging confirms needle placed into the left kidney lower pole cortex for core biopsy IMPRESSION: Successful ultrasound left renal 16 gauge core biopsy Electronically Signed   By: Jerilynn Mages.  Shick M.D.   On: 04/21/2019 09:41        Scheduled Meds: . calcitRIOL  0.25 mcg Oral Daily  . calcium acetate  667 mg Oral TID WC  . [START ON 04/22/2019] Chlorhexidine Gluconate Cloth  6 each Topical Q0600  . fentaNYL      . gelatin adsorbable      . levothyroxine  175 mcg Oral QAC breakfast  . lidocaine (PF)      . metoprolol tartrate  12.5 mg Oral BID  . midazolam      . pantoprazole  40 mg Oral Daily  . simvastatin  10 mg Oral q1800  . sucralfate  1 g Oral TID WC & HS   Continuous Infusions:    LOS: 7 days   Time spent= 25 mins    Bonnell Public, MD [facility]  If 7PM-7AM, please contact night-coverage [email or web address] 04/21/2019, 1:35 PM

## 2019-04-21 NOTE — Care Management Important Message (Signed)
Important Message  Patient Details  Name: Jeremy Johnson MRN: [id number] Date of Birth: [birth year]   Medicare Important Message Given:  Yes    Memory Argue 04/21/2019, 4:19 PM

## 2019-04-21 NOTE — Progress Notes (Signed)
[facility] Progress Note    Assessment/ Plan:   73 y.o. yo male with  HTN, HLD, AAA s/p repair in 2006causing damage to the kidney and then CKD with baseline creatinine around2.5per patient, recently dx with SCC of lung s/p 3 cycles of carboplatin and etoposide and RT, last chemo on 5/18, sent to the hospital for abnormal labs including hemoglobin 6.9 and serum creatinine level 9.86.    #Acute kidney injury on CKD - on 5/11 crt was 2.79, 5/18 3.37 then just worse from there.   possiblyATN due to carboplatin, losartan/HCTZand anemia: Other differential could be glomerulonephritis due to Neulasta vs membranous GN due to cancer vs rare possibility of chemo induced TMA. Urinalysis with proteinuria- over 11 grams and hematuria. -Patient is asymptomatic and had good urine output despite of worsening serum creatinine level to 13 today. Rate of rise is less ? No absolute HD indications but starting to get to an uncomfortable range   serologies- c3, c4 WNL, ANCA neg , ANA negative , antiGBM ab negative , hep B, C, HIV all negative , K/l ratio is 1.45. -  Haptoglobin high , INR and LDH slightly high. No mention of schistocytes in CBC differential.   -US renal with increased echogenicity consistent with CKD.   - Appreciate Dr. Annamaria Boots performing the kidney biopsy for the definitive diagnosis.  - temporarily will need to initiate HD as he's getting uremic; will request VIR to place temp cath.   #Metabolic acidosis due to renal failure: Improved.  #Hypomagnesemia: Due to carboplatin. Replete magnesium sulfate. 1.7 today   # Hypokalemia: replete KCL. Is fine today   #Hypertension: Monitor BP.Continue metoprolol. Seems fine.  Stop IVF   #Anemia due to chemotherapy: Received 2 units of PRBC transfusion on 04/14/2019. Monitor CBC. Drifting down again   #Thrombocytopenia: Due to chemo. No sign of bleeding. Inc nicely    #Small cell lung cancer: On chemotherapy. Per  oncology.  Hyperphosphatemia-  Started on phoslo   Subjective:   Denies f/c/v but is  Intermittently nauseous with a poor appetite. Denies dyspnea.   Objective:   BP 134/77 (BP Location: Right Arm)   Pulse 89   Temp 98.2 F (36.8 C) (Oral)   Resp 18   Ht 5\' 9"  (1.753 m)   Wt 92.4 kg   SpO2 95%   BMI 30.08 kg/m   Intake/Output Summary (Last 24 hours) at 04/21/2019 1206 Last data filed at 04/21/2019 1038 Gross per 24 hour  Intake 460 ml  Output 1950 ml  Net -1490 ml   Weight change: -1.948 kg  Physical Exam: General: Not in distress, comfortable Heart:RRR, s1s2 nl no rubs Lungs: Bilateral, no wheezing or crackle Abdomen:soft, Non-tender, non-distended Extremities: trace edema Neurology: Alert, awake, nonfocal, no asterixis Skin: No rash or ulcer  Imaging: US Biopsy (kidney)  Result Date: 04/21/2019 INDICATION: Acute kidney injury EXAM: ULTRASOUND GUIDED CORE BIOPSY OF LEFT KIDNEY MEDICATIONS: 1% LIDOCAINE LOCAL ANESTHESIA/SEDATION: Versed 2.0mg  IV; Fentanyl 83mcg IV; Moderate Sedation Time:  11 minutes The patient was continuously monitored during the procedure by the interventional radiology nurse under my direct supervision. FLUOROSCOPY TIME:  Fluoroscopy Time: None. COMPLICATIONS: None immediate. PROCEDURE: The procedure, risks, benefits, and alternatives were explained to the patient. Questions regarding the procedure were encouraged and answered. The patient understands and consents to the procedure. The left flank was prepped with ChloraPrep in a sterile fashion, and a sterile drape was applied covering the operative field. A sterile gown and sterile gloves were used  for the procedure. Local anesthesia was provided with 1% Lidocaine. Patient positioned prone. Preliminary ultrasound performed. The left kidney lower pole was localized. Overlying skin marked. Under sterile conditions and local anesthesia, a 15 gauge coaxial guide needle was advanced to the left kidney lower  pole. Needle position confirmed with ultrasound. 2 16 gauge core biopsies obtained of the cortex. Needle position confirmed with ultrasound. Images obtained for documentation. Needle tract occluded with Gel-Foam. Patient tolerated the procedure well. FINDINGS: Imaging confirms needle placed into the left kidney lower pole cortex for core biopsy IMPRESSION: Successful ultrasound left renal 16 gauge core biopsy Electronically Signed   By: Jerilynn Mages.  Shick M.D.   On: 04/21/2019 09:41    Labs: BMET Recent Labs  Lab 04/15/19 0055 04/16/19 0442 04/17/19 0424 04/18/19 0154 04/19/19 0303 04/20/19 0449 04/21/19 0541  NA 136 135 135 135 136 137 135  K 4.9 3.8 3.4* 3.4* 4.0 3.6 3.9  CL 109 106 99 101 104 102 102  CO2 14* 15* 23 21* 20* 19* 19*  GLUCOSE 103* 105* 124* 105* 95 101* 94  BUN 82* 79* 73* 69* 72* 74* 79*  CREATININE 10.18* 10.73* 11.70* 11.98* 13.15* 13.78* 14.89*  CALCIUM 7.4* 7.9* 7.4* 7.3* 7.6* 7.8* 7.8*  PHOS 6.4* 7.1* 7.2* 7.5*  --  7.2* 8.5*   CBC Recent Labs  Lab 04/14/19 1247  04/18/19 0154 04/19/19 0303 04/20/19 0449 04/21/19 0541  WBC 12.2*   < > 10.7* 10.7* 12.1* 12.1*  NEUTROABS 8.9*  --  7.8*  --  8.6*  --   HGB 6.9*   < > 7.1* 7.0* 7.4* 7.3*  HCT 19.9*   < > 19.8* 20.5* 21.4* 21.1*  MCV 91.7   < > 85.7 87.6 87.3 87.6  PLT 32*   < > 72* 97* 133* 165   < > = values in this interval not displayed.    Medications:    . calcitRIOL  0.25 mcg Oral Daily  . calcium acetate  667 mg Oral TID WC  . fentaNYL      . gelatin adsorbable      . levothyroxine  175 mcg Oral QAC breakfast  . lidocaine (PF)      . metoprolol tartrate  12.5 mg Oral BID  . midazolam      . pantoprazole  40 mg Oral Daily  . simvastatin  10 mg Oral q1800  . sucralfate  1 g Oral TID WC & HS      Otelia Santee, MD 04/21/2019, 12:06 PM

## 2019-04-21 NOTE — Procedures (Signed)
CKD  S/P [place] LEFT RENAL BX  No comp Stable ebl min Path pending Full report in pacs

## 2019-04-21 NOTE — Progress Notes (Signed)
IR requested by Dr. Augustin Coupe for possible image-guided tunneled HD catheter conversion.  Patient underwent a sedation procedure in IR this AM. Because of this, plan for procedure tomorrow 04/22/2019 in IR. Will consent patient tomorrow prior to procedure as he has had sedation this AM. INR 1.1 on 04/17/2019. Patient will be NPO at midnight.  Please call IR with questions/concerns.   Bea Graff Delon Revelo, PA-C 04/21/2019, 1:31 PM

## 2019-04-22 ENCOUNTER — Ambulatory Visit: Payer: Medicare Other

## 2019-04-22 ENCOUNTER — Encounter (HOSPITAL_COMMUNITY): Payer: Self-pay | Admitting: Radiology

## 2019-04-22 ENCOUNTER — Inpatient Hospital Stay (HOSPITAL_COMMUNITY): Payer: Medicare Other

## 2019-04-22 DIAGNOSIS — R809 Proteinuria, unspecified: Secondary | ICD-10-CM

## 2019-04-22 DIAGNOSIS — R319 Hematuria, unspecified: Secondary | ICD-10-CM

## 2019-04-22 HISTORY — PX: IR US GUIDE VASC ACCESS RIGHT: IMG2390

## 2019-04-22 HISTORY — PX: IR FLUORO GUIDE CV LINE RIGHT: IMG2283

## 2019-04-22 LAB — RETICULOCYTES
Immature Retic Fract: 15.2 % (ref 2.3–15.9)
RBC.: 2.59 MIL/uL — ABNORMAL LOW (ref 4.22–5.81)
Retic Count, Absolute: 39.1 10*3/uL (ref 19.0–186.0)
Retic Ct Pct: 1.5 % (ref 0.4–3.1)

## 2019-04-22 LAB — IRON AND TIBC
Iron: 18 ug/dL — ABNORMAL LOW (ref 45–182)
Saturation Ratios: 13 % — ABNORMAL LOW (ref 17.9–39.5)
TIBC: 141 ug/dL — ABNORMAL LOW (ref 250–450)
UIBC: 123 ug/dL

## 2019-04-22 LAB — RENAL FUNCTION PANEL
Albumin: 2 g/dL — ABNORMAL LOW (ref 3.5–5.0)
Anion gap: 20 — ABNORMAL HIGH (ref 5–15)
BUN: 87 mg/dL — ABNORMAL HIGH (ref 8–23)
CO2: 18 mmol/L — ABNORMAL LOW (ref 22–32)
Calcium: 8.2 mg/dL — ABNORMAL LOW (ref 8.9–10.3)
Chloride: 96 mmol/L — ABNORMAL LOW (ref 98–111)
Creatinine, Ser: 15.95 mg/dL — ABNORMAL HIGH (ref 0.61–1.24)
GFR calc Af Amer: 3 mL/min — ABNORMAL LOW (ref >60)
GFR calc non Af Amer: 3 mL/min — ABNORMAL LOW (ref >60)
Glucose, Bld: 103 mg/dL — ABNORMAL HIGH (ref 70–99)
Phosphorus: 8.7 mg/dL — ABNORMAL HIGH (ref 2.5–4.6)
Potassium: 3.3 mmol/L — ABNORMAL LOW (ref 3.5–5.1)
Sodium: 134 mmol/L — ABNORMAL LOW (ref 135–145)

## 2019-04-22 LAB — CBC
HCT: 25 % — ABNORMAL LOW (ref 39.0–52.0)
Hemoglobin: 8.5 g/dL — ABNORMAL LOW (ref 13.0–17.0)
MCH: 30 pg (ref 26.0–34.0)
MCHC: 34 g/dL (ref 30.0–36.0)
MCV: 88.3 fL (ref 80.0–100.0)
Platelets: 234 10*3/uL (ref 150–400)
RBC: 2.83 MIL/uL — ABNORMAL LOW (ref 4.22–5.81)
RDW: 15.7 % — ABNORMAL HIGH (ref 11.5–15.5)
WBC: 15.5 10*3/uL — ABNORMAL HIGH (ref 4.0–10.5)
nRBC: 0 % (ref 0.0–0.2)

## 2019-04-22 LAB — LIPID PANEL
Cholesterol: 136 mg/dL (ref 0–200)
HDL: 25 mg/dL — ABNORMAL LOW (ref >40)
LDL Cholesterol: 77 mg/dL (ref 0–99)
Total CHOL/HDL Ratio: 5.4 RATIO
Triglycerides: 169 mg/dL — ABNORMAL HIGH (ref <150)
VLDL: 34 mg/dL (ref 0–40)

## 2019-04-22 LAB — FERRITIN: Ferritin: 1038 ng/mL — ABNORMAL HIGH (ref 24–336)

## 2019-04-22 LAB — VITAMIN B12: Vitamin B-12: 1007 pg/mL — ABNORMAL HIGH (ref 180–914)

## 2019-04-22 LAB — FOLATE: Folate: 11.1 ng/mL (ref >5.9)

## 2019-04-22 LAB — MAGNESIUM: Magnesium: 1.9 mg/dL (ref 1.7–2.4)

## 2019-04-22 MED ORDER — GELATIN ABSORBABLE 12-7 MM EX MISC
CUTANEOUS | Status: AC
  Administered 2019-04-22: 1
  Filled 2019-04-22: qty 1

## 2019-04-22 MED ORDER — LIDOCAINE HCL (PF) 1 % IJ SOLN
INTRAMUSCULAR | Status: AC | PRN
  Administered 2019-04-22: 10 mL

## 2019-04-22 MED ORDER — FENTANYL CITRATE (PF) 100 MCG/2ML IJ SOLN
INTRAMUSCULAR | Status: AC | PRN
  Administered 2019-04-22: 50 ug via INTRAVENOUS

## 2019-04-22 MED ORDER — HEPARIN SODIUM (PORCINE) 1000 UNIT/ML IJ SOLN
INTRAMUSCULAR | Status: AC
  Administered 2019-04-22: 3.8 [IU]
  Filled 2019-04-22: qty 1

## 2019-04-22 MED ORDER — METOPROLOL TARTRATE 25 MG PO TABS
25.0000 mg | ORAL_TABLET | Freq: Two times a day (BID) | ORAL | Status: DC
  Administered 2019-04-22 – 2019-04-26 (×7): 25 mg via ORAL
  Filled 2019-04-22 (×7): qty 1

## 2019-04-22 MED ORDER — AMLODIPINE BESYLATE 5 MG PO TABS
5.0000 mg | ORAL_TABLET | Freq: Every day | ORAL | Status: DC
  Administered 2019-04-23 – 2019-04-26 (×2): 5 mg via ORAL
  Filled 2019-04-22 (×2): qty 1

## 2019-04-22 MED ORDER — FENTANYL CITRATE (PF) 100 MCG/2ML IJ SOLN
INTRAMUSCULAR | Status: AC
  Filled 2019-04-22: qty 2

## 2019-04-22 MED ORDER — SEVELAMER CARBONATE 2.4 G PO PACK
2.4000 g | PACK | Freq: Three times a day (TID) | ORAL | Status: DC
  Administered 2019-04-22 – 2019-04-24 (×5): 2.4 g via ORAL
  Filled 2019-04-22 (×6): qty 1

## 2019-04-22 MED ORDER — LIDOCAINE HCL 1 % IJ SOLN
INTRAMUSCULAR | Status: AC
  Filled 2019-04-22: qty 20

## 2019-04-22 MED ORDER — VANCOMYCIN HCL IN DEXTROSE 1-5 GM/200ML-% IV SOLN
1000.0000 mg | INTRAVENOUS | Status: AC
  Administered 2019-04-22: 1000 mg via INTRAVENOUS

## 2019-04-22 MED ORDER — VANCOMYCIN HCL IN DEXTROSE 1-5 GM/200ML-% IV SOLN
INTRAVENOUS | Status: AC
  Filled 2019-04-22: qty 200

## 2019-04-22 NOTE — Procedures (Signed)
Acute kidney injury  S/p RT IJ TUNNELED HD CATH  No comp Stable ebl min Tip svcra  Ready for use

## 2019-04-22 NOTE — Consult Note (Signed)
Chief Complaint: Patient was seen in consultation today for tunneled dialysis catheter placement Chief Complaint  Patient presents with  . Abnormal Lab   at the request of Dr Corliss Marcus  Supervising Physician: Daryll Brod  Patient Status: [facility] - In-pt  History of Present Illness: Jeremy Johnson is a 73 y.o. male   Hx AAA repair 2006-- AKI Transition to CKD Now acute on chronic kidney disease Worsening renal function Renal bx yesterday  Request for tunneled dialysis catheter placement for initiation of HD-- possible prolonged course per MD    Past Medical History:  Diagnosis Date  . Anemia   . Blood transfusion without reported diagnosis   . CAD (coronary artery disease)    STENT... MID CIRCUMFLEX...1997  . Cancer ([place])   . Chronic kidney disease    STAGE 3  . Degenerative joint disease (DJD) of lumbar spine   . GERD (gastroesophageal reflux disease)   . Gout   . Hyperlipidemia   . Hypertension   . Hypothyroidism   . Incisional hernia   . Leukocytosis    CHRONIC MILD  . Myocardial infarction ([place])    1997    Past Surgical History:  Procedure Laterality Date  . ABDOMINAL AORTIC ANEURYSM REPAIR  2006  . BIOPSY  09/30/2018   Procedure: BIOPSY;  Surgeon: Danie Binder, MD;  [facility];  Service: Endoscopy;;  ascending colon  . COLONOSCOPY  2008  . COLONOSCOPY N/A 09/30/2018   Procedure: COLONOSCOPY;  Surgeon: Danie Binder, MD;  Location: [facility];  Service: Endoscopy;  Laterality: N/A;  9:00  . CORONARY ANGIOPLASTY WITH STENT PLACEMENT  2008   MID CIRCUMFLEX  . POLYPECTOMY  09/30/2018   Procedure: POLYPECTOMY;  Surgeon: Danie Binder, MD;  [facility]: [facility];  Service: Endoscopy;;  colon  . VIDEO BRONCHOSCOPY WITH ENDOBRONCHIAL NAVIGATION N/A 01/27/2019   Procedure: VIDEO BRONCHOSCOPY WITH ENDOBRONCHIAL NAVIGATION;  Surgeon: Grace Isaac, MD;  Location: [place];  Service: Thoracic;  Laterality: N/A;  . VIDEO BRONCHOSCOPY WITH  ENDOBRONCHIAL ULTRASOUND N/A 01/27/2019   Procedure: VIDEO BRONCHOSCOPY WITH ENDOBRONCHIAL ULTRASOUND;  Surgeon: Grace Isaac, MD;  Location: [facility] OR;  Service: Thoracic;  Laterality: N/A;    Allergies: Penicillins  Medications: Prior to Admission medications   Medication Sig Start Date End Date Taking? Authorizing Provider  allopurinol (ZYLOPRIM) 300 MG tablet Take 300 mg by mouth daily.   Yes [provider]  amLODipine (NORVASC) 5 MG tablet Take 5 mg by mouth daily.  03/12/18  Yes [provider]  aspirin EC 81 MG tablet Take 81 mg by mouth daily.   Yes [provider]  calcitRIOL (ROCALTROL) 0.25 MCG capsule Take 0.25 mcg by mouth daily.  11/25/18  Yes [provider]  levothyroxine (SYNTHROID, LEVOTHROID) 175 MCG tablet Take 175 mcg by mouth daily before breakfast.   Yes [provider]  losartan-hydrochlorothiazide (HYZAAR) 100-25 MG tablet Take 1 tablet by mouth daily.   Yes [provider]  metoprolol tartrate (LOPRESSOR) 50 MG tablet Take 25 mg by mouth 2 (two) times daily.    Yes [provider]  mometasone (ELOCON) 0.1 % ointment Apply 1 application topically 2 (two) times daily as needed (for eczema).  07/11/18  Yes [provider]  omeprazole (PRILOSEC) 20 MG capsule Take 20 mg by mouth daily.   Yes [provider]  prochlorperazine (COMPAZINE) 10 MG tablet Take 1 tablet (10 mg total) by mouth every 6 (six) hours as  needed for nausea or vomiting. 02/06/19  Yes Curt Bears, MD  simvastatin (ZOCOR) 20 MG tablet Take 20 mg by mouth daily.  03/12/18  Yes [provider]  sucralfate (CARAFATE) 1 g tablet Take 1 tablet (1 g total) by mouth 4 (four) times daily -  with meals and at bedtime. 5 min before meals for radiation induced esophagitis 04/11/19  Yes Tyler Pita, MD     Family History  Problem Relation Age of Onset  . Stroke Brother   . Lung cancer Sister 53       lung  cancer/former    Social History   Socioeconomic History  . Marital status: Married    Spouse name: Not on file  . Number of children: 2  . Years of education: Not on file  . Highest education level: Not on file  Occupational History    Comment: retired  Scientific laboratory technician  . Financial resource strain: Not on file  . Food insecurity:    Worry: Not on file    Inability: Not on file  . Transportation needs:    Medical: Not on file    Non-medical: Not on file  Tobacco Use  . Smoking status: Former Smoker    Packs/day: 0.50    Years: 54.00    Pack years: 27.00    Last attempt to quit: 09/17/2017    Years since quitting: 1.5  . Smokeless tobacco: Never Used  Substance and Sexual Activity  . Alcohol use: Yes    Comment: occasional  . Drug use: No  . Sexual activity: Not Currently  Lifestyle  . Physical activity:    Days per week: Not on file    Minutes per session: Not on file  . Stress: Not on file  Relationships  . Social connections:    Talks on phone: Not on file    Gets together: Not on file    Attends religious service: Not on file    Active member of club or organization: Not on file    Attends meetings of clubs or organizations: Not on file    Relationship status: Not on file  Other Topics Concern  . Not on file  Social History Narrative  . Not on file    Review of Systems: A 12 point ROS discussed and pertinent positives are indicated in the HPI above.  All other systems are negative.  Review of Systems  Constitutional: Positive for activity change and fatigue. Negative for fever.  Respiratory: Negative for cough and shortness of breath.   Cardiovascular: Negative for chest pain.  Gastrointestinal: Negative for abdominal pain.  Neurological: Negative for weakness.  Psychiatric/Behavioral: Negative for behavioral problems and confusion.    Vital Signs: BP 136/66 (BP Location: Left Arm)   Pulse 78   Temp 98.2 F (36.8 C) (Oral)   Resp 18   Ht 5\' 9"   (1.753 m)   Wt 200 lb 9.9 oz (91 kg)   SpO2 94%   BMI 29.63 kg/m   Physical Exam Vitals signs reviewed.  Cardiovascular:     Rate and Rhythm: Normal rate and regular rhythm.     Heart sounds: Normal heart sounds.  Pulmonary:     Breath sounds: Normal breath sounds.  Abdominal:     Tenderness: There is no abdominal tenderness.  Musculoskeletal: Normal range of motion.  Skin:    General: Skin is warm and dry.  Neurological:     Mental Status: He is alert and oriented to person, place, and  time.  Psychiatric:        Mood and Affect: Mood normal.        Behavior: Behavior normal.        Thought Content: Thought content normal.        Judgment: Judgment normal.     Imaging: Ct Abdomen Pelvis Wo Contrast  Result Date: 04/17/2019 CLINICAL DATA:  Inpatient. Gross hematuria with passage of clots. Acute renal failure. History of small cell lung cancer diagnosed March 2020 treated with chemotherapy. EXAM: CT ABDOMEN AND PELVIS WITHOUT CONTRAST TECHNIQUE: Multidetector CT imaging of the abdomen and pelvis was performed following the standard protocol without IV contrast. COMPARISON:  01/22/2019 PET-CT. FINDINGS: Lower chest: Trace dependent bilateral pleural effusions with mild dependent bibasilar atelectasis. Hepatobiliary: Normal liver size. No liver mass. Normal gallbladder with no radiopaque cholelithiasis. No biliary ductal dilatation. Pancreas: No pancreatic mass or duct dilation. There is mild haziness of the peripancreatic fat. There is new fat stranding and ill-defined fluid throughout the anterior paranephric spaces bilaterally extending into the pelvis. Spleen: Normal size. No mass. Adrenals/Urinary Tract: Normal adrenals. No hydronephrosis. No renal stones. Normal caliber ureters. No ureteral stones. Several simple renal cysts scattered in the right kidney, largest 7.2 cm in posterior lower right kidney. No contour deforming left renal masses. Normal bladder. Stomach/Bowel: Small hiatal  hernia. Otherwise normal nondistended stomach. Normal caliber small bowel with no small bowel wall thickening. Appendix not discretely visualized. Oral contrast transits to left colon. Partial visualization of a midline supraumbilical ventral hernia containing a small portion of the transverse colon, with no evidence of associated colonic wall thickening, pneumatosis or caliber transition. Small left inguinal hernia contains a small portion of the proximal sigmoid colon, with no associated colonic wall thickening, pneumatosis or caliber transition. No large bowel wall thickening, significant diverticulosis or significant pericolonic fat stranding. Vascular/Lymphatic: Atherosclerotic abdominal aorta with 3.2 cm infrarenal abdominal aortic aneurysm. No pathologically enlarged lymph nodes in the abdomen or pelvis. Reproductive: Top-normal size prostate. Other: No pneumoperitoneum, ascites or focal fluid collection. Musculoskeletal: No aggressive appearing focal osseous lesions. Moderate thoracolumbar spondylosis. IMPRESSION: 1. No urolithiasis. No hydronephrosis. No acute bladder abnormality. 2. Mild haziness of the peripancreatic fat. New fat stranding and ill-defined fluid throughout the anterior paranephric retroperitoneal spaces bilaterally extending into the pelvis. Acute pancreatitis cannot be excluded. Suggest correlation with serum lipase. No biliary or pancreatic duct dilation on this noncontrast scan. 3. Trace dependent bilateral pleural effusions. 4. Midline supraumbilical ventral hernia contains a portion of the transverse colon. Left inguinal hernia contains a portion of the sigmoid colon. No acute bowel complication. 5. Small hiatal hernia. 6. Infrarenal 3.2 cm Abdominal Aortic Aneurysm (ICD10-I71.9). Recommend follow-up aortic ultrasound in 3 years. This recommendation follows ACR consensus guidelines: White Paper of the ACR Incidental Findings Committee II on Vascular Findings. J Am Coll Radiol 2013;  10:789-794. 7.  Aortic Atherosclerosis (ICD10-I70.0). Electronically Signed   By: Ilona Sorrel M.D.   On: 04/17/2019 16:09   Dg Chest 2 View  Result Date: 04/14/2019 CLINICAL DATA:  Anemia.  History of lung cancer. EXAM: CHEST - 2 VIEW COMPARISON:  Chest x-ray 01/24/2019 and chest CT 03/24/2019 FINDINGS: The cardiac silhouette, mediastinal and hilar contours are within normal limits and stable. There is mild tortuosity of the thoracic aorta. Mild chronic emphysematous changes with areas of pulmonary scarring. No infiltrates, edema or effusions. No worrisome pulmonary lesions. IMPRESSION: Chronic emphysematous changes and pulmonary scarring but no definite acute overlying pulmonary process or new pulmonary lesions. Electronically Signed  By: Marijo Sanes M.D.   On: 04/14/2019 13:20   Ct Chest Wo Contrast  Result Date: 03/25/2019 CLINICAL DATA:  Limited stage small cell lung cancer EXAM: CT CHEST WITHOUT CONTRAST TECHNIQUE: Multidetector CT imaging of the chest was performed following the standard protocol without IV contrast. COMPARISON:  PET-CT dated 01/22/2019 FINDINGS: Cardiovascular: Heart is top-normal in size. No pericardial effusion. Stable focal aneurysmal dilatation anteriorly along the anterior aspect of the transverse aortic arch (series 2/image 50), measuring up to 4.7 cm, grossly unchanged. Atherosclerotic calcifications of the abdominal aorta and branch vessels. Coronary atherosclerosis of the LAD and left circumflex. Mediastinum/Nodes: No suspicious mediastinal lymphadenopathy. Lungs/Pleura: Prior perifissural nodule/mass anterior to the descending thoracic aorta has essentially resolved (series 7/image 58). Minimal residual scarring/radiation changes. Otherwise, no suspicious pulmonary nodules. No focal consolidation. Moderate centrilobular and paraseptal emphysematous changes, upper lobe predominant. No pleural effusion or pneumothorax. Upper Abdomen: Visualized upper abdomen is grossly  unremarkable, noting vascular calcifications. Musculoskeletal: Degenerative changes of the visualized thoracolumbar spine. IMPRESSION: Prior perifissural nodule/mass anterior to the descending thoracic aorta has essentially resolved. Mild residual scarring/radiation changes. No evidence of recurrent/metastatic disease. Aortic Atherosclerosis (ICD10-I70.0) and Emphysema (ICD10-J43.9). Electronically Signed   By: Julian Hy M.D.   On: 03/25/2019 08:49   US Renal  Result Date: 04/15/2019 CLINICAL DATA:  Acute renal disease.  Chronic renal disease. EXAM: RENAL / URINARY TRACT ULTRASOUND COMPLETE COMPARISON:  PET-CT 01/22/2019. FINDINGS: Right Kidney: Renal measurements: 12.8 x 6.0 x 6.1 cm = volume: 242.3 mL. Increased echogenicity. 8.8 cm simple cyst. 3.6 cm simple cyst. No hydronephrosis visualized. Left Kidney: Renal measurements: 12.9 x 6.1 x 5.3 cm = volume: 219.5 mL. Increased echogenicity. No mass or hydronephrosis visualized. Bladder: Appears normal for degree of bladder distention. IMPRESSION: Increased echogenicity both kidneys. This consistent chronic medical renal disease. Two simple cyst right kidney. No evidence of hydronephrosis or bladder distention. No acute abnormality identified. Electronically Signed   By: Marcello Moores  Register   On: 04/15/2019 11:33   US Biopsy (kidney)  Result Date: 04/21/2019 INDICATION: Acute kidney injury EXAM: ULTRASOUND GUIDED CORE BIOPSY OF LEFT KIDNEY MEDICATIONS: 1% LIDOCAINE LOCAL ANESTHESIA/SEDATION: Versed 2.0mg  IV; Fentanyl 43mcg IV; Moderate Sedation Time:  11 minutes The patient was continuously monitored during the procedure by the interventional radiology nurse under my direct supervision. FLUOROSCOPY TIME:  Fluoroscopy Time: None. COMPLICATIONS: None immediate. PROCEDURE: The procedure, risks, benefits, and alternatives were explained to the patient. Questions regarding the procedure were encouraged and answered. The patient understands and consents to the  procedure. The left flank was prepped with ChloraPrep in a sterile fashion, and a sterile drape was applied covering the operative field. A sterile gown and sterile gloves were used for the procedure. Local anesthesia was provided with 1% Lidocaine. Patient positioned prone. Preliminary ultrasound performed. The left kidney lower pole was localized. Overlying skin marked. Under sterile conditions and local anesthesia, a 15 gauge coaxial guide needle was advanced to the left kidney lower pole. Needle position confirmed with ultrasound. 2 16 gauge core biopsies obtained of the cortex. Needle position confirmed with ultrasound. Images obtained for documentation. Needle tract occluded with Gel-Foam. Patient tolerated the procedure well. FINDINGS: Imaging confirms needle placed into the left kidney lower pole cortex for core biopsy IMPRESSION: Successful ultrasound left renal 16 gauge core biopsy Electronically Signed   By: Jerilynn Mages.  Shick M.D.   On: 04/21/2019 09:41    Labs:  CBC: Recent Labs    04/18/19 0154 04/19/19 0303 04/20/19 0449 04/21/19 0541  WBC 10.7* 10.7* 12.1* 12.1*  HGB 7.1* 7.0* 7.4* 7.3*  HCT 19.8* 20.5* 21.4* 21.1*  PLT 72* 97* 133* 165    COAGS: Recent Labs    01/24/19 0834 04/17/19 0951  INR 1.0 1.2  APTT 32  --     BMP: Recent Labs    04/18/19 0154 04/19/19 0303 04/20/19 0449 04/21/19 0541  NA 135 136 137 135  K 3.4* 4.0 3.6 3.9  CL 101 104 102 102  CO2 21* 20* 19* 19*  GLUCOSE 105* 95 101* 94  BUN 69* 72* 74* 79*  CALCIUM 7.3* 7.6* 7.8* 7.8*  CREATININE 11.98* 13.15* 13.78* 14.89*  GFRNONAA 4* 3* 3* 3*  GFRAA 4* 4* 4* 3*    LIVER FUNCTION TESTS: Recent Labs    04/14/19 1247  04/18/19 0154 04/19/19 0303 04/20/19 0449 04/21/19 0541  BILITOT 0.3  --   --  0.5 0.3 0.4  AST 13*  --   --  16 20 21   ALT 12  --   --  13 17 18   ALKPHOS 90  --   --  70 74 76  PROT 6.3*  --   --  4.7* 4.9* 5.0*  ALBUMIN 2.7*   < > 1.8* 1.8* 1.8* 1.7*   < > = values in this  interval not displayed.    TUMOR MARKERS: No results for input(s): AFPTM, CEA, CA199, CHROMGRNA in the last 8760 hours.  Assessment and Plan:  Worsening A/CKD Initiation of HD -- prolonged course per MD Tunneled catheter placement in IR today Risks and benefits discussed with the patient including, but not limited to bleeding, infection, vascular injury, pneumothorax which may require chest tube placement, air embolism or even death  All of the patient's questions were answered, patient is agreeable to proceed. Consent signed and in chart.   Thank you for this interesting consult.  I greatly enjoyed meeting DORSEY AUTHEMENT and look forward to participating in their care.  A copy of this report was sent to the requesting provider on this date.  Electronically Signed: Lavonia Drafts, PA-C 04/22/2019, 7:24 AM   I spent a total of 20 Minutes    in face to face in clinical consultation, greater than 50% of which was counseling/coordinating care for tunneled dialysis catheter placement

## 2019-04-22 NOTE — Progress Notes (Signed)
[facility] Progress Note    Assessment/ Plan:   73 y.o.yo malewith HTN, HLD, AAA s/p repair in 2006causing damage to the kidney and then CKD with baseline creatinine around2.5per patient, recently dx with SCC of lung s/p 3 cycles of carboplatin and etoposide and RT, last chemo on 5/18, sent to the hospital for abnormal labs including hemoglobin 6.9 and serum creatinine level 9.86.  #Acute kidney injury on CKD- on 5/11 crt was 2.79, 5/18 3.37 then just worse from there. possiblyATN due to carboplatin, losartan/HCTZand anemia: Other differential could be glomerulonephritis due to Neulasta vs membranous GN due to cancer vs rare possibility of chemo induced TMA. Urinalysis with proteinuria- over 11 grams and hematuria. -Patient is asymptomatic and had good urine output despite of worsening serum creatinine level to 13 today. Rate of rise is less?No absoluteHD indications but starting to get to an uncomfortable range  serologies- c3, c4 WNL, ANCA neg , ANA negative , antiGBM ab negative , hep B, C, HIV all negative , K/l ratio is 1.45. - Haptoglobin high , INR and LDH slightly high. No mention of schistocytes in CBC differential.  -US renal with increased echogenicity consistent with CKD.   - Appreciate Dr. Annamaria Boots performing the kidney biopsy for the definitive diagnosis. No bleeding noted overnight. - temporarily will need to initiate HD as he's getting uremic; VIR to place tunneled cath as this is likely to be a prolonged course and will HD #1 afterwards today.  #Metabolic acidosis due to renal failure: Improved.  #Hypomagnesemia: Due to carboplatin. Repleted magnesium sulfate.   # Hypokalemia: improved  #Hypertension: Monitor BP.Continue metoprolol. Seems fine. Stop IVF   #Anemia due to chemotherapy: Received 2 units of PRBC transfusion on 04/14/2019. Monitor CBC. Drifting down again   #Thrombocytopenia: Due to chemo. No sign of bleeding. Inc  nicely   #Small cell lung cancer: On chemotherapy. Per oncology.  Hyperphosphatemia- change to renvela 2400mg  TIDM, will also check a CK.  Subjective:   Denies f/c/v but is  Intermittently nauseous with a poor appetite. Denies dyspnea. No blood in urine overnight.   Objective:   BP 136/66 (BP Location: Left Arm)   Pulse 78   Temp 98.2 F (36.8 C) (Oral)   Resp 18   Ht 5\' 9"  (1.753 m)   Wt 91 kg   SpO2 94%   BMI 29.63 kg/m   Intake/Output Summary (Last 24 hours) at 04/22/2019 0732 Last data filed at 04/22/2019 0630 Gross per 24 hour  Intake 590 ml  Output 3900 ml  Net -3310 ml   Weight change: -1.352 kg  Physical Exam: General: Not in distress, comfortable Heart:RRR, s1s2 nl no rubs Lungs: Bilateral, no wheezing or crackle Abdomen:soft, Non-tender, non-distended Extremities: trace edema Neurology: Alert, awake, nonfocal, no asterixis Skin:No rash or ulcer  Imaging: US Biopsy (kidney)  Result Date: 04/21/2019 INDICATION: Acute kidney injury EXAM: ULTRASOUND GUIDED CORE BIOPSY OF LEFT KIDNEY MEDICATIONS: 1% LIDOCAINE LOCAL ANESTHESIA/SEDATION: Versed 2.0mg  IV; Fentanyl 45mcg IV; Moderate Sedation Time:  11 minutes The patient was continuously monitored during the procedure by the interventional radiology nurse under my direct supervision. FLUOROSCOPY TIME:  Fluoroscopy Time: None. COMPLICATIONS: None immediate. PROCEDURE: The procedure, risks, benefits, and alternatives were explained to the patient. Questions regarding the procedure were encouraged and answered. The patient understands and consents to the procedure. The left flank was prepped with ChloraPrep in a sterile fashion, and a sterile drape was applied covering the operative field. A sterile gown and sterile gloves were  used for the procedure. Local anesthesia was provided with 1% Lidocaine. Patient positioned prone. Preliminary ultrasound performed. The left kidney lower pole was localized. Overlying skin marked.  Under sterile conditions and local anesthesia, a 15 gauge coaxial guide needle was advanced to the left kidney lower pole. Needle position confirmed with ultrasound. 2 16 gauge core biopsies obtained of the cortex. Needle position confirmed with ultrasound. Images obtained for documentation. Needle tract occluded with Gel-Foam. Patient tolerated the procedure well. FINDINGS: Imaging confirms needle placed into the left kidney lower pole cortex for core biopsy IMPRESSION: Successful ultrasound left renal 16 gauge core biopsy Electronically Signed   By: Jerilynn Mages.  Shick M.D.   On: 04/21/2019 09:41    Labs: BMET Recent Labs  Lab 04/16/19 0442 04/17/19 0424 04/18/19 0154 04/19/19 0303 04/20/19 0449 04/21/19 0541  NA 135 135 135 136 137 135  K 3.8 3.4* 3.4* 4.0 3.6 3.9  CL 106 99 101 104 102 102  CO2 15* 23 21* 20* 19* 19*  GLUCOSE 105* 124* 105* 95 101* 94  BUN 79* 73* 69* 72* 74* 79*  CREATININE 10.73* 11.70* 11.98* 13.15* 13.78* 14.89*  CALCIUM 7.9* 7.4* 7.3* 7.6* 7.8* 7.8*  PHOS 7.1* 7.2* 7.5*  --  7.2* 8.5*   CBC Recent Labs  Lab 04/18/19 0154 04/19/19 0303 04/20/19 0449 04/21/19 0541  WBC 10.7* 10.7* 12.1* 12.1*  NEUTROABS 7.8*  --  8.6*  --   HGB 7.1* 7.0* 7.4* 7.3*  HCT 19.8* 20.5* 21.4* 21.1*  MCV 85.7 87.6 87.3 87.6  PLT 72* 97* 133* 165    Medications:    . calcitRIOL  0.25 mcg Oral Daily  . calcium acetate  667 mg Oral TID WC  . Chlorhexidine Gluconate Cloth  6 each Topical Q0600  . levothyroxine  175 mcg Oral QAC breakfast  . metoprolol tartrate  12.5 mg Oral BID  . pantoprazole  40 mg Oral Daily  . simvastatin  10 mg Oral q1800  . sucralfate  1 g Oral TID WC & HS      Otelia Santee, MD 04/22/2019, 7:32 AM

## 2019-04-22 NOTE — Progress Notes (Signed)
PROGRESS NOTE  Jeremy Johnson BJY:[id number] DOB: 1946/05/14 DOA: 04/14/2019 PCP: [name], MD   LOS: 8 days   Patient is from: Home  Brief Narrative / Interim history: 73 year old with history of limited stage small cell lung cancer, anemia of chronic disease, CKD-3, essential HTN hypothyroidism, HLD and CAD who was sent to the hospital by oncologist for evaluation of AKI with creatinine to 9.5 and anemia with Hgb to 6.9. Patient was transferred to [facility] for possible renal biopsy and initiation of dialysis.  Renal function continued to worsen. Patient underwent renal biopsy on 04/21/2019.  Plan for HD cath to start hemodialysis per nephrology.  Subjective: No major events overnight of this morning.  No complaint this morning.  Denies chest pain, dyspnea, nausea, vomiting or abdominal pain.  Reports good urine output.  Denies leg swelling.   Assessment & Plan: Acute on CKD-3: Creatinine 9.5 on admission, up to 16 this morning. Cr 2.79 and 3.37 on 5/11 and 5/18 respectively. BUN 87.  Suspicion for ATN due to carboplatin, losartan/HCTZ and anemia.  Has proteinuria to 11.5 g dysuria concerning for nephrotic syndrome/glomerular pathology although he does not have significant edema.  Renal ultrasound suggestive for CKD.  Serologies including hepatitis panel not impressive.  No schistocytes on smear.  Haptoglobin high.  -Appreciate nephrology guidance -HD cath today, then to start HD -Has renal biopsy on 6/8-follow-up pathology -Continue monitoring renal function -Avoid nephrotoxic meds  Anion gap metabolic acidosis: Likely due to renal failure/azotemia. -Defer to nephrology  Leukocytosis: there is some element of hemoconcentration.  No apparent source of infection.  -We will continue monitoring  Anemia of chronic disease likely due to chemotherapy and renal failure: Hemoglobin 6.9 on admission.  Transfused 1 unit with appropriate response.  Hgb 8.5 today.  No apparent  bleeding except for moderate hematuria -Continue monitoring -Check anemia panel  Hematuria: H&H stable now -Outpatient urology follow-up  Chronic CAD: No cardiopulmonary symptoms -Continue home metoprolol, aspirin and Zocor  Essential hypertension: Normotensive.  On amlodipine and metoprolol at home -Increase metoprolol to home dose -Resume home amlodipine -Hold home losartan/HCTZ in the setting of AKI  Limited stage (T2b, N1, M0) small cell lung cancer: Diagnosed in 01/31/2019 -Recently started chemotherapy with carboplatin, etoposide-received cycle 3 on 5/18. -Outpatient oncology follow-up  Thrombocytopenia: Resolved.  Hypokalemia -Per nephrology  Scheduled Meds: . calcitRIOL  0.25 mcg Oral Daily  . Chlorhexidine Gluconate Cloth  6 each Topical Q0600  . heparin      . levothyroxine  175 mcg Oral QAC breakfast  . lidocaine      . metoprolol tartrate  12.5 mg Oral BID  . pantoprazole  40 mg Oral Daily  . sevelamer carbonate  2.4 g Oral TID WC  . simvastatin  10 mg Oral q1800  . sucralfate  1 g Oral TID WC & HS   Continuous Infusions: . vancomycin     PRN Meds:.acetaminophen, famotidine, hydrALAZINE, ondansetron (ZOFRAN) IV, polyethylene glycol, senna-docusate, traZODone   DVT prophylaxis: SCD Code Status: Full code Family Communication: Pending. Disposition Plan: Remains inpatient for acute renal failure.  Starting HD  Consultants:   Nephrology  IR  Procedures:   Renal biopsy on 6/8  HD cath on 6/9  Microbiology: . COVID-19 negative  Antimicrobials: Anti-infectives (From admission, onward)   Start     Dose/Rate Route Frequency Ordered Stop   04/22/19 0745  vancomycin (VANCOCIN) IVPB 1000 mg/200 mL premix     1,000 mg 200 mL/hr over 60 Minutes Intravenous  To Radiology 04/22/19 0731 04/23/19 0745       Objective: Vitals:   04/21/19 2055 04/22/19 0500 04/22/19 0509 04/22/19 1045  BP: 134/80  136/66 135/68  Pulse: 80  78 79  Resp: 18  18    Temp: 98.8 F (37.1 C)  98.2 F (36.8 C) 98.1 F (36.7 C)  TempSrc: Oral  Oral Oral  SpO2: 94%  94% 95%  Weight: 91 kg 91 kg    Height:        Intake/Output Summary (Last 24 hours) at 04/22/2019 1227 Last data filed at 04/22/2019 0801 Gross per 24 hour  Intake 350 ml  Output 3250 ml  Net -2900 ml   Filed Weights   04/21/19 0317 04/21/19 2055 04/22/19 0500  Weight: 92.4 kg 91 kg 91 kg    Examination:  GENERAL: No acute distress.  Appears well.  HEENT: MMM.  Vision and hearing grossly intact.  NECK: Supple.  No JVD.  LUNGS:  No IWOB. Good air movement bilaterally. HEART:  RRR. Heart sounds normal.  ABD: Bowel sounds present. Soft. Non tender.  MSK/EXT:  Moves all extremities. No apparent deformity. No edema bilaterally.  SKIN: no apparent skin lesion or wound NEURO: Awake, alert and oriented appropriately.  No gross deficit.  PSYCH: Calm. Normal affect.    Data Reviewed: I have independently reviewed following labs and imaging studies  CBC: Recent Labs  Lab 04/18/19 0154 04/19/19 0303 04/20/19 0449 04/21/19 0541 04/22/19 0612  WBC 10.7* 10.7* 12.1* 12.1* 15.5*  NEUTROABS 7.8*  --  8.6*  --   --   HGB 7.1* 7.0* 7.4* 7.3* 8.5*  HCT 19.8* 20.5* 21.4* 21.1* 25.0*  MCV 85.7 87.6 87.3 87.6 88.3  PLT 72* 97* 133* 165 270   Basic Metabolic Panel: Recent Labs  Lab 04/17/19 0424 04/18/19 0154 04/19/19 0303 04/20/19 0449 04/21/19 0541 04/22/19 0612  NA 135 135 136 137 135 134*  K 3.4* 3.4* 4.0 3.6 3.9 3.3*  CL 99 101 104 102 102 96*  CO2 23 21* 20* 19* 19* 18*  GLUCOSE 124* 105* 95 101* 94 103*  BUN 73* 69* 72* 74* 79* 87*  CREATININE 11.70* 11.98* 13.15* 13.78* 14.89* 15.95*  CALCIUM 7.4* 7.3* 7.6* 7.8* 7.8* 8.2*  MG 1.5* 1.8 1.7 1.7 1.7 1.9  PHOS 7.2* 7.5*  --  7.2* 8.5* 8.7*   GFR: Estimated Creatinine Clearance: 4.7 mL/min (A) (by C-G formula based on SCr of 15.95 mg/dL (H)). Liver Function Tests: Recent Labs  Lab 04/18/19 0154 04/19/19 0303  04/20/19 0449 04/21/19 0541 04/22/19 0612  AST  --  16 20 21   --   ALT  --  13 17 18   --   ALKPHOS  --  70 74 76  --   BILITOT  --  0.5 0.3 0.4  --   PROT  --  4.7* 4.9* 5.0*  --   ALBUMIN 1.8* 1.8* 1.8* 1.7* 2.0*   Recent Labs  Lab 04/18/19 0154  LIPASE 22   No results for input(s): AMMONIA in the last 168 hours. Coagulation Profile: Recent Labs  Lab 04/17/19 0951  INR 1.2   Cardiac Enzymes: No results for input(s): CKTOTAL, CKMB, CKMBINDEX, TROPONINI in the last 168 hours. BNP (last 3 results) No results for input(s): PROBNP in the last 8760 hours. HbA1C: No results for input(s): HGBA1C in the last 72 hours. CBG: No results for input(s): GLUCAP in the last 168 hours. Lipid Profile: No results for input(s): CHOL, HDL, LDLCALC, TRIG, CHOLHDL,  LDLDIRECT in the last 72 hours. Thyroid Function Tests: No results for input(s): TSH, T4TOTAL, FREET4, T3FREE, THYROIDAB in the last 72 hours. Anemia Panel: No results for input(s): VITAMINB12, FOLATE, FERRITIN, TIBC, IRON, RETICCTPCT in the last 72 hours. Urine analysis:    Component Value Date/Time   COLORURINE STRAW (A) 04/15/2019 0913   APPEARANCEUR CLEAR 04/15/2019 0913   LABSPEC 1.010 04/15/2019 0913   PHURINE 7.0 04/15/2019 0913   GLUCOSEU 50 (A) 04/15/2019 0913   HGBUR MODERATE (A) 04/15/2019 0913   BILIRUBINUR NEGATIVE 04/15/2019 0913   KETONESUR NEGATIVE 04/15/2019 0913   PROTEINUR >=300 (A) 04/15/2019 0913   NITRITE NEGATIVE 04/15/2019 0913   LEUKOCYTESUR NEGATIVE 04/15/2019 0913   Sepsis Labs: Invalid input(s): PROCALCITONIN, LACTICIDVEN  Recent Results (from the past 240 hour(s))  SARS Coronavirus 2 (CEPHEID - Performed in [place] hospital lab), Hosp Order     Status: None   Collection Time: 04/14/19  2:13 PM  Result Value Ref Range Status   SARS Coronavirus 2 NEGATIVE NEGATIVE Final    Comment: (NOTE) If result is NEGATIVE SARS-CoV-2 target nucleic acids are NOT DETECTED. The SARS-CoV-2 RNA is  generally detectable in upper and lower  respiratory specimens during the acute phase of infection. The lowest  concentration of SARS-CoV-2 viral copies this assay can detect is 250  copies / mL. A negative result does not preclude SARS-CoV-2 infection  and should not be used as the sole basis for treatment or other  patient management decisions.  A negative result may occur with  improper specimen collection / handling, submission of specimen other  than nasopharyngeal swab, presence of viral mutation(s) within the  areas targeted by this assay, and inadequate number of viral copies  (<250 copies / mL). A negative result must be combined with clinical  observations, patient history, and epidemiological information. If result is POSITIVE SARS-CoV-2 target nucleic acids are DETECTED. The SARS-CoV-2 RNA is generally detectable in upper and lower  respiratory specimens dur ing the acute phase of infection.  Positive  results are indicative of active infection with SARS-CoV-2.  Clinical  correlation with patient history and other diagnostic information is  necessary to determine patient infection status.  Positive results do  not rule out bacterial infection or co-infection with other viruses. If result is PRESUMPTIVE POSTIVE SARS-CoV-2 nucleic acids MAY BE PRESENT.   A presumptive positive result was obtained on the submitted specimen  and confirmed on repeat testing.  While 2019 novel coronavirus  (SARS-CoV-2) nucleic acids may be present in the submitted sample  additional confirmatory testing may be necessary for epidemiological  and / or clinical management purposes  to differentiate between  SARS-CoV-2 and other Sarbecovirus currently known to infect humans.  If clinically indicated additional testing with an alternate test  methodology [phone]) is advised. The SARS-CoV-2 RNA is generally  detectable in upper and lower respiratory sp ecimens during the acute  phase of  infection. The expected result is Negative. Fact Sheet for Patients:  StrictlyIdeas.no Fact Sheet for Healthcare Providers: BankingDealers.co.za This test is not yet approved or cleared by the [place] FDA and has been authorized for detection and/or diagnosis of SARS-CoV-2 by FDA under an Emergency Use Authorization (EUA).  This EUA will remain in effect (meaning this test can be used) for the duration of the COVID-19 declaration under Section 564(b)(1) of the Act, 21 U.S.C. section 360bbb-3(b)(1), unless the authorization is terminated or revoked sooner. Performed at [facility], [address], [place], [place] [address]  Radiology Studies: No results found.   Taye T. [facility] Pager [phone]  If 7PM-7AM, please contact night-coverage [email or web address] Password [facility] 04/22/2019, 12:27 PM

## 2019-04-22 NOTE — Sedation Documentation (Addendum)
Patient ate lunch. No sedation to be given. MD aware.

## 2019-04-23 ENCOUNTER — Telehealth: Payer: Self-pay | Admitting: Medical Oncology

## 2019-04-23 ENCOUNTER — Ambulatory Visit: Payer: Medicare Other

## 2019-04-23 DIAGNOSIS — N019 Rapidly progressive nephritic syndrome with unspecified morphologic changes: Secondary | ICD-10-CM

## 2019-04-23 DIAGNOSIS — N186 End stage renal disease: Secondary | ICD-10-CM

## 2019-04-23 DIAGNOSIS — Z992 Dependence on renal dialysis: Secondary | ICD-10-CM

## 2019-04-23 LAB — COMPREHENSIVE METABOLIC PANEL
ALT: 25 U/L (ref 0–44)
AST: 25 U/L (ref 15–41)
Albumin: 1.7 g/dL — ABNORMAL LOW (ref 3.5–5.0)
Alkaline Phosphatase: 74 U/L (ref 38–126)
Anion gap: 15 (ref 5–15)
BUN: 51 mg/dL — ABNORMAL HIGH (ref 8–23)
CO2: 21 mmol/L — ABNORMAL LOW (ref 22–32)
Calcium: 7.6 mg/dL — ABNORMAL LOW (ref 8.9–10.3)
Chloride: 100 mmol/L (ref 98–111)
Creatinine, Ser: 11.18 mg/dL — ABNORMAL HIGH (ref 0.61–1.24)
GFR calc Af Amer: 5 mL/min — ABNORMAL LOW (ref >60)
GFR calc non Af Amer: 4 mL/min — ABNORMAL LOW (ref >60)
Glucose, Bld: 154 mg/dL — ABNORMAL HIGH (ref 70–99)
Potassium: 3.1 mmol/L — ABNORMAL LOW (ref 3.5–5.1)
Sodium: 136 mmol/L (ref 135–145)
Total Bilirubin: 0.6 mg/dL (ref 0.3–1.2)
Total Protein: 5.7 g/dL — ABNORMAL LOW (ref 6.5–8.1)

## 2019-04-23 LAB — MAGNESIUM: Magnesium: 1.6 mg/dL — ABNORMAL LOW (ref 1.7–2.4)

## 2019-04-23 LAB — CBC
HCT: 20.9 % — ABNORMAL LOW (ref 39.0–52.0)
Hemoglobin: 7.4 g/dL — ABNORMAL LOW (ref 13.0–17.0)
MCH: 30.6 pg (ref 26.0–34.0)
MCHC: 35.4 g/dL (ref 30.0–36.0)
MCV: 86.4 fL (ref 80.0–100.0)
Platelets: 211 10*3/uL (ref 150–400)
RBC: 2.42 MIL/uL — ABNORMAL LOW (ref 4.22–5.81)
RDW: 15.4 % (ref 11.5–15.5)
WBC: 9.8 10*3/uL (ref 4.0–10.5)
nRBC: 0 % (ref 0.0–0.2)

## 2019-04-23 LAB — PHOSPHORUS: Phosphorus: 4.3 mg/dL (ref 2.5–4.6)

## 2019-04-23 LAB — CK: Total CK: 72 U/L (ref 49–397)

## 2019-04-23 MED ORDER — HEPARIN SODIUM (PORCINE) 1000 UNIT/ML DIALYSIS: 1000.0000 [IU] | INTRAMUSCULAR | Status: DC | PRN

## 2019-04-23 MED ORDER — MAGNESIUM SULFATE 2 GM/50ML IV SOLN
2.0000 g | Freq: Once | INTRAVENOUS | Status: AC
  Administered 2019-04-23: 2 g via INTRAVENOUS
  Filled 2019-04-23: qty 50

## 2019-04-23 MED ORDER — POTASSIUM CHLORIDE CRYS ER 20 MEQ PO TBCR
40.0000 meq | EXTENDED_RELEASE_TABLET | Freq: Once | ORAL | Status: AC
  Administered 2019-04-23: 40 meq via ORAL
  Filled 2019-04-23: qty 2

## 2019-04-23 MED ORDER — SODIUM CHLORIDE 0.9 % IV SOLN: 100.0000 mL | INTRAVENOUS | Status: DC | PRN

## 2019-04-23 NOTE — Progress Notes (Signed)
[facility] Progress Note    Assessment/ Plan:   73 y.o.yo malewith HTN, HLD, AAA s/p repair in 2006causing damage to the kidney and then CKD with baseline creatinine around2.5per patient, recently dx with SCC of lung s/p 3 cycles of carboplatin and etoposide and RT, last chemo on 5/18, sent to the hospital for abnormal labs including hemoglobin 6.9 and serum creatinine level 9.86.  #Acute kidney injury on CKD- on 5/11 crt was 2.79, 5/18 3.37 then just worse from there. possiblyATN due to carboplatin, losartan/HCTZand anemia: Other differential could be glomerulonephritis due to Neulasta vs membranous GN due to cancer vs rare possibility of chemo induced TMA. Urinalysis with proteinuria- over 11 grams and hematuria. -Patient is asymptomatic and had good urine output despite of worsening serum creatinine level to 13 today. Rate of rise is less?No absoluteHD indications but starting to get to an uncomfortable range  serologies- c3, c4 WNL, ANCA neg , ANA negative , antiGBM ab negative , hep B, C, HIV all negative , K/l ratio is 1.45. - Haptoglobin high , INR and LDH slightly high. No mention of schistocytes in CBC differential.  -US renal with increased echogenicity consistent with CKD.  - Appreciate Dr. Annamaria Boots performing the kidney biopsy for the definitive diagnosis.No bleeding noted overnight. - Biopsy shows anti-GBM w/ 100% crescents; chances of recovery with aggressive tx (PLEX, high dose steroids, CTX) is very low. D/w pt and risk of sepsis far outweights low potential for recovery.    Appreciate  VIR  Placing RIJ  tunneled; consulted [facility] for a permanent access placement.  Plan on HD #2 tomorrow but will plan around [facility].  Also started CLIP process.  #Metabolic acidosis due to renal failure: Improved.  #Hypomagnesemia: Due to carboplatin. Repleted magnesium sulfate.   # Hypokalemia: improved  #Hypertension: Monitor BP.Continue  metoprolol. Seems fine.    #Anemia due to chemotherapy: Received 2 units of PRBC transfusion on 04/14/2019. Monitor CBC. Drifting down again   #Thrombocytopenia: Due to chemo. No sign of bleeding. Inc nicely   #Small cell lung cancer: On chemotherapy. Per oncology - spoke w/ Dr. Julien Nordmann and he will need 1 more cycle (would be the 4th).  Hyperphosphatemia- change to renvela 2400mg  TIDM, will also check a CK. Phos is better now. Recheck Sat.  Subjective:   Denies f/c/v but is Intermittently nauseous with a poor appetite. Denies dyspnea. No blood in urine overnight.  No issues w/ HD but finished early this am and he is tired   Objective:   BP (!) 143/83 (BP Location: Right Arm)   Pulse 82   Temp 98 F (36.7 C) (Oral)   Resp 18   Ht 5\' 9"  (1.753 m)   Wt 89.9 kg   SpO2 95%   BMI 29.27 kg/m   Intake/Output Summary (Last 24 hours) at 04/23/2019 1258 Last data filed at 04/23/2019 0900 Gross per 24 hour  Intake 910 ml  Output 1700 ml  Net -790 ml   Weight change: 0 kg  Physical Exam: General: Not in distress, comfortable Heart:RRR, s1s2 nl no rubs Lungs: Bilateral, no wheezing or crackle Abdomen:soft, Non-tender, non-distended Extremities:traceedema Neurology: Alert, awake, nonfocal, no asterixis Skin:No rash or ulcer Access:RIJ TC   Imaging: Ir Fluoro Guide Cv Line Right  Result Date: 04/22/2019 INDICATION: Acute kidney injury, access for dialysis EXAM: ULTRASOUND GUIDANCE FOR VASCULAR ACCESS RIGHT INTERNAL JUGULAR PERMANENT HEMODIALYSIS CATHETER Date:  04/22/2019 04/22/2019 1:31 pm Radiologist:  Jerilynn Mages. Daryll Brod, MD Guidance:  Ultrasound and fluoroscopic FLUOROSCOPY TIME:  Fluoroscopy Time: 0 minutes 32 seconds (4 mGy). MEDICATIONS: 1 g vancomycin within hour of the procedure ANESTHESIA/SEDATION: Versed 0 mg IV; Fentanyl 50 mcg IV; Moderate Sedation Time:  None. The patient was continuously monitored during the procedure by the interventional radiology nurse under my  direct supervision. CONTRAST:  None. COMPLICATIONS: None immediate. PROCEDURE: Informed consent was obtained from the patient following explanation of the procedure, risks, benefits and alternatives. The patient understands, agrees and consents for the procedure. All questions were addressed. A time out was performed. Maximal barrier sterile technique utilized including caps, mask, sterile gowns, sterile gloves, large sterile drape, hand hygiene, and 2% chlorhexidine scrub. Under sterile conditions and local anesthesia, right internal jugular micropuncture venous access was performed with ultrasound. Images were obtained for documentation of the patent right internal jugular vein. A guide wire was inserted followed by a transitional dilator. Next, a 0.035 guidewire was advanced into the IVC with a 5-French catheter. Measurements were obtained from the right venotomy site to the proximal right atrium. In the right infraclavicular chest, a subcutaneous tunnel was created under sterile conditions and local anesthesia. 1% lidocaine with epinephrine was utilized for this. The 23 cm tip to cuff palindrome catheter was tunneled subcutaneously to the venotomy site and inserted into the SVC/RA junction through a valved peel-away sheath. Position was confirmed with fluoroscopy. Images were obtained for documentation. Blood was aspirated from the catheter followed by saline and heparin flushes. The appropriate volume and strength of heparin was instilled in each lumen. Caps were applied. The catheter was secured at the tunnel site with Gelfoam and a pursestring suture. The venotomy site was closed with subcuticular Vicryl suture. Dermabond was applied to the small right neck incision. A dry sterile dressing was applied. The catheter is ready for use. No immediate complications. IMPRESSION: Ultrasound and fluoroscopically guided right internal jugular tunneled hemodialysis catheter (23 cm tip to cuff palindrome catheter).  Electronically Signed   By: Jerilynn Mages.  Shick M.D.   On: 04/22/2019 13:36   Ir US Guide Vasc Access Right  Result Date: 04/22/2019 INDICATION: Acute kidney injury, access for dialysis EXAM: ULTRASOUND GUIDANCE FOR VASCULAR ACCESS RIGHT INTERNAL JUGULAR PERMANENT HEMODIALYSIS CATHETER Date:  04/22/2019 04/22/2019 1:31 pm Radiologist:  Jerilynn Mages. Daryll Brod, MD Guidance:  Ultrasound and fluoroscopic FLUOROSCOPY TIME:  Fluoroscopy Time: 0 minutes 32 seconds (4 mGy). MEDICATIONS: 1 g vancomycin within hour of the procedure ANESTHESIA/SEDATION: Versed 0 mg IV; Fentanyl 50 mcg IV; Moderate Sedation Time:  None. The patient was continuously monitored during the procedure by the interventional radiology nurse under my direct supervision. CONTRAST:  None. COMPLICATIONS: None immediate. PROCEDURE: Informed consent was obtained from the patient following explanation of the procedure, risks, benefits and alternatives. The patient understands, agrees and consents for the procedure. All questions were addressed. A time out was performed. Maximal barrier sterile technique utilized including caps, mask, sterile gowns, sterile gloves, large sterile drape, hand hygiene, and 2% chlorhexidine scrub. Under sterile conditions and local anesthesia, right internal jugular micropuncture venous access was performed with ultrasound. Images were obtained for documentation of the patent right internal jugular vein. A guide wire was inserted followed by a transitional dilator. Next, a 0.035 guidewire was advanced into the IVC with a 5-French catheter. Measurements were obtained from the right venotomy site to the proximal right atrium. In the right infraclavicular chest, a subcutaneous tunnel was created under sterile conditions and local anesthesia. 1% lidocaine with epinephrine was utilized for this. The 23 cm tip to cuff palindrome catheter was  tunneled subcutaneously to the venotomy site and inserted into the SVC/RA junction through a valved peel-away  sheath. Position was confirmed with fluoroscopy. Images were obtained for documentation. Blood was aspirated from the catheter followed by saline and heparin flushes. The appropriate volume and strength of heparin was instilled in each lumen. Caps were applied. The catheter was secured at the tunnel site with Gelfoam and a pursestring suture. The venotomy site was closed with subcuticular Vicryl suture. Dermabond was applied to the small right neck incision. A dry sterile dressing was applied. The catheter is ready for use. No immediate complications. IMPRESSION: Ultrasound and fluoroscopically guided right internal jugular tunneled hemodialysis catheter (23 cm tip to cuff palindrome catheter). Electronically Signed   By: Jerilynn Mages.  Shick M.D.   On: 04/22/2019 13:36    Labs: BMET Recent Labs  Lab 04/17/19 0424 04/18/19 0154 04/19/19 0303 04/20/19 0449 04/21/19 0541 04/22/19 0612 04/23/19 0934  NA 135 135 136 137 135 134* 136  K 3.4* 3.4* 4.0 3.6 3.9 3.3* 3.1*  CL 99 101 104 102 102 96* 100  CO2 23 21* 20* 19* 19* 18* 21*  GLUCOSE 124* 105* 95 101* 94 103* 154*  BUN 73* 69* 72* 74* 79* 87* 51*  CREATININE 11.70* 11.98* 13.15* 13.78* 14.89* 15.95* 11.18*  CALCIUM 7.4* 7.3* 7.6* 7.8* 7.8* 8.2* 7.6*  PHOS 7.2* 7.5*  --  7.2* 8.5* 8.7* 4.3   CBC Recent Labs  Lab 04/18/19 0154  04/20/19 0449 04/21/19 0541 04/22/19 0612 04/23/19 0934  WBC 10.7*   < > 12.1* 12.1* 15.5* 9.8  NEUTROABS 7.8*  --  8.6*  --   --   --   HGB 7.1*   < > 7.4* 7.3* 8.5* 7.4*  HCT 19.8*   < > 21.4* 21.1* 25.0* 20.9*  MCV 85.7   < > 87.3 87.6 88.3 86.4  PLT 72*   < > 133* 165 234 211   < > = values in this interval not displayed.    Medications:    . amLODipine  5 mg Oral Daily  . calcitRIOL  0.25 mcg Oral Daily  . Chlorhexidine Gluconate Cloth  6 each Topical Q0600  . levothyroxine  175 mcg Oral QAC breakfast  . metoprolol tartrate  25 mg Oral BID  . pantoprazole  40 mg Oral Daily  . sevelamer carbonate  2.4 g  Oral TID WC  . simvastatin  10 mg Oral q1800  . sucralfate  1 g Oral TID WC & HS      Otelia Santee, MD 04/23/2019, 12:58 PM

## 2019-04-23 NOTE — Progress Notes (Signed)
Renal Navigator received notification from Nephrologist/Dr. Augustin Coupe to initiate OP HD referral. Renal Navigator attempted to call patient who did not answer. Renal Navigator called patient's wife/Betty who is listed as ER contact in Epic. Patient's wife informed Renal Navigator of patient's Chemo and Radiation appointments at [facility] on M, T, Ws. She asked that patient be referred to [facility]. Renal Navigator called Diane, RN at Dr. Lew Dawes office and spoke with her about patient's OP HD needs and current cancer treatment appointments.  Renal Navigator submitted referral to [facility] Admissions Coordinator requesting a first shift TTS schedule to accommodate his cancer treatments.  Renal Navigator will follow up with patient's wife, Dr. Augustin Coupe and Shauna Hugh, RN once seat schedule has been obtained.  Alphonzo Cruise, Page Renal Navigator [phone]

## 2019-04-23 NOTE — Telephone Encounter (Signed)
Dialysis -Jeremy Johnson is setting up his dialysis at [facility] in [place]. She is going to try for Tuesday,thursday and sat ,first shift 6a-10a to work around his xrt and infusion.

## 2019-04-23 NOTE — Consult Note (Addendum)
Hospital Consult    Reason for Consult: Permanent dialysis access Requesting Physician: Dr. Augustin Coupe MRN #:  [id number]  History of Present Illness: This is a 73 y.o. male with past medical history significant for hypertension, hyperlipidemia, CAD, active small cell lung cancer receiving chemotherapy, thoracic aortic aneurysm, and CKD progressing to end-stage renal disease requiring initiation of hemodialysis.  He is being seen in consultation for evaluation for permanent dialysis access placement.  Interventional radiology placed a tunneled dialysis catheter and the patient had his first hemodialysis treatment earlier this morning.  He is right arm dominant and would prefer placement of access in left arm.  He is not taking any blood thinners.  He does not have a pacemaker.  Past Medical History:  Diagnosis Date  . Anemia   . Blood transfusion without reported diagnosis   . CAD (coronary artery disease)    STENT... MID CIRCUMFLEX...1997  . Cancer ([place])   . Chronic kidney disease    STAGE 3  . Degenerative joint disease (DJD) of lumbar spine   . GERD (gastroesophageal reflux disease)   . Gout   . Hyperlipidemia   . Hypertension   . Hypothyroidism   . Incisional hernia   . Leukocytosis    CHRONIC MILD  . Myocardial infarction ([place])    1997    Past Surgical History:  Procedure Laterality Date  . ABDOMINAL AORTIC ANEURYSM REPAIR  2006  . BIOPSY  09/30/2018   Procedure: BIOPSY;  Surgeon: Danie Binder, MD;  [facility];  Service: Endoscopy;;  ascending colon  . COLONOSCOPY  2008  . COLONOSCOPY N/A 09/30/2018   Procedure: COLONOSCOPY;  Surgeon: Danie Binder, MD;  [facility];  Service: Endoscopy;  Laterality: N/A;  9:00  . CORONARY ANGIOPLASTY WITH STENT PLACEMENT  2008   MID CIRCUMFLEX  . IR FLUORO GUIDE CV LINE RIGHT  04/22/2019  . IR US GUIDE VASC ACCESS RIGHT  04/22/2019  . POLYPECTOMY  09/30/2018   Procedure: POLYPECTOMY;  Surgeon: Danie Binder,  MD;  [facility];  Service: Endoscopy;;  colon  . VIDEO BRONCHOSCOPY WITH ENDOBRONCHIAL NAVIGATION N/A 01/27/2019   Procedure: VIDEO BRONCHOSCOPY WITH ENDOBRONCHIAL NAVIGATION;  Surgeon: Grace Isaac, MD;  Location: [facility];  Service: Thoracic;  Laterality: N/A;  . VIDEO BRONCHOSCOPY WITH ENDOBRONCHIAL ULTRASOUND N/A 01/27/2019   Procedure: VIDEO BRONCHOSCOPY WITH ENDOBRONCHIAL ULTRASOUND;  Surgeon: Grace Isaac, MD;  Location: [facility];  Service: Thoracic;  Laterality: N/A;    Allergies  Allergen Reactions  . Penicillins Rash and Other (See Comments)    Has patient had a PCN reaction causing immediate rash, facial/tongue/throat swelling, SOB or lightheadedness with hypotension: No Has patient had a PCN reaction causing severe rash involving mucus membranes or skin necrosis: No Has patient had a PCN reaction that required hospitalization: No Has patient had a PCN reaction occurring within the last 10 years: No If all of the above answers are "NO", then may proceed with Cephalosporin use.     Prior to Admission medications   Medication Sig Start Date End Date Taking? Authorizing Provider  allopurinol (ZYLOPRIM) 300 MG tablet Take 300 mg by mouth daily.   Yes [provider]  amLODipine (NORVASC) 5 MG tablet Take 5 mg by mouth daily.  03/12/18  Yes [provider]  aspirin EC 81 MG tablet Take 81 mg by mouth daily.   Yes [provider]  calcitRIOL (ROCALTROL) 0.25 MCG capsule Take 0.25 mcg by mouth daily.  11/25/18  Yes [provider]  levothyroxine (SYNTHROID, LEVOTHROID) 175 MCG tablet Take 175 mcg by mouth daily before breakfast.   Yes [provider]  losartan-hydrochlorothiazide (HYZAAR) 100-25 MG tablet Take 1 tablet by mouth daily.   Yes [provider]  metoprolol tartrate (LOPRESSOR) 50 MG tablet Take 25 mg by mouth 2 (two) times daily.    Yes [provider]  mometasone (ELOCON) 0.1 % ointment Apply 1  application topically 2 (two) times daily as needed (for eczema).  07/11/18  Yes [provider]  omeprazole (PRILOSEC) 20 MG capsule Take 20 mg by mouth daily.   Yes [provider]  prochlorperazine (COMPAZINE) 10 MG tablet Take 1 tablet (10 mg total) by mouth every 6 (six) hours as needed for nausea or vomiting. 02/06/19  Yes Curt Bears, MD  simvastatin (ZOCOR) 20 MG tablet Take 20 mg by mouth daily.  03/12/18  Yes [provider]  sucralfate (CARAFATE) 1 g tablet Take 1 tablet (1 g total) by mouth 4 (four) times daily -  with meals and at bedtime. 5 min before meals for radiation induced esophagitis 04/11/19  Yes Tyler Pita, MD    Social History   Socioeconomic History  . Marital status: Married    Spouse name: Not on file  . Number of children: 2  . Years of education: Not on file  . Highest education level: Not on file  Occupational History    Comment: retired  Scientific laboratory technician  . Financial resource strain: Not on file  . Food insecurity:    Worry: Not on file    Inability: Not on file  . Transportation needs:    Medical: Not on file    Non-medical: Not on file  Tobacco Use  . Smoking status: Former Smoker    Packs/day: 0.50    Years: 54.00    Pack years: 27.00    Last attempt to quit: 09/17/2017    Years since quitting: 1.5  . Smokeless tobacco: Never Used  Substance and Sexual Activity  . Alcohol use: Yes    Comment: occasional  . Drug use: No  . Sexual activity: Not Currently  Lifestyle  . Physical activity:    Days per week: Not on file    Minutes per session: Not on file  . Stress: Not on file  Relationships  . Social connections:    Talks on phone: Not on file    Gets together: Not on file    Attends religious service: Not on file    Active member of club or organization: Not on file    Attends meetings of clubs or organizations: Not on file    Relationship status: Not on file  . Intimate partner violence:    Fear of  current or ex partner: Not on file    Emotionally abused: Not on file    Physically abused: Not on file    Forced sexual activity: Not on file  Other Topics Concern  . Not on file  Social History Narrative  . Not on file     Family History  Problem Relation Age of Onset  . Stroke Brother   . Lung cancer Sister 23       lung cancer/former    ROS: Otherwise negative unless mentioned in HPI  Physical Examination  Vitals:   04/23/19 0544 04/23/19 0951  BP: 135/64 (!) 143/83  Pulse: 77 82  Resp: 18 18  Temp: 98 F (36.7 C) 98 F (36.7  C)  SpO2: 94% 95%   Body mass index is 29.27 kg/m.  General:  WDWN in NAD Gait: Not observed HENT: WNL, normocephalic Pulmonary: normal non-labored breathing Cardiac: regular Abdomen:  soft, NT/ND, no masses Skin: without rashes Vascular Exam/Pulses: symmetrical radial pulses Extremities: without ischemic changes, without Gangrene , without cellulitis; without open wounds;  Musculoskeletal: no muscle wasting or atrophy  Neurologic: A&O X 3;  No focal weakness or paresthesias are detected; speech is fluent/normal Psychiatric:  The pt has Normal affect. Lymph:  Unremarkable  CBC    Component Value Date/Time   WBC 9.8 04/23/2019 0934   RBC 2.42 (L) 04/23/2019 0934   HGB 7.4 (L) 04/23/2019 0934   HGB 6.8 (LL) 04/14/2019 0959   HCT 20.9 (L) 04/23/2019 0934   PLT 211 04/23/2019 0934   PLT 34 (L) 04/14/2019 0959   MCV 86.4 04/23/2019 0934   MCH 30.6 04/23/2019 0934   MCHC 35.4 04/23/2019 0934   RDW 15.4 04/23/2019 0934   LYMPHSABS 1.3 04/20/2019 0449   MONOABS 1.9 (H) 04/20/2019 0449   EOSABS 0.1 04/20/2019 0449   BASOSABS 0.0 04/20/2019 0449    BMET    Component Value Date/Time   NA 136 04/23/2019 0934   K 3.1 (L) 04/23/2019 0934   CL 100 04/23/2019 0934   CO2 21 (L) 04/23/2019 0934   GLUCOSE 154 (H) 04/23/2019 0934   BUN 51 (H) 04/23/2019 0934   CREATININE 11.18 (H) 04/23/2019 0934   CREATININE 9.52 (HH) 04/14/2019  0959   CALCIUM 7.6 (L) 04/23/2019 0934   GFRNONAA 4 (L) 04/23/2019 0934   GFRNONAA 5 (L) 04/14/2019 0959   GFRAA 5 (L) 04/23/2019 0934   GFRAA 6 (L) 04/14/2019 0959    COAGS: Lab Results  Component Value Date   INR 1.2 04/17/2019   INR 1.0 01/24/2019     Non-Invasive Vascular Imaging:   Vein mapping pending   ASSESSMENT/PLAN: This is a 73 y.o. male with CKD progressing to end-stage renal disease requiring hemodialysis  Vein mapping pending Right arm dominant; restrict left upper extremity Plan will be for left arm AV fistula creation versus graft placement later this week On-call vascular surgeon Dr. Donnetta Hutching will evaluate the patient and provide further treatment plans.   Dagoberto Ligas PA-C [facility] [phone]  I have examined the patient, reviewed and agree with above.  Normal arterial pulses in left and right arm.  Will make a plan for left AV fistula versus AV graft pending vein map.  Plan surgery for Friday.  Discussed at length with the patient who understands  Curt Jews, MD 04/23/2019 3:26 PM

## 2019-04-23 NOTE — Progress Notes (Signed)
PROGRESS NOTE  Jeremy Johnson RXV:[id number] DOB: Jun 06, 1946 DOA: 04/14/2019 PCP: [name], MD   LOS: 9 days   Patient is from: Home  Brief Narrative / Interim history: 73 year old with history of limited stage small cell lung cancer, anemia of chronic disease, CKD-3, essential HTN hypothyroidism, HLD and CAD who was sent to the hospital by oncologist for evaluation of AKI with creatinine to 9.5 and anemia with Hgb to 6.9. Patient was transferred to [facility] for possible renal biopsy and initiation of dialysis.  Renal function continued to worsen. Patient underwent renal biopsy on 04/21/2019.  HD cath placed on 6/9 hemodialysis started on 6/10.  Subjective: No major events overnight of this morning.  No complaints this morning other than being woken up at 3 AM for dialysis.  Denies chest pain, dyspnea, abdominal pain, nausea or vomiting.  Continues to make significant amount   Assessment & Plan: Acute on CKD-3/azotemia: Creatinine 9.5 on admission, up to 16 this morning. Cr 2.79 and 3.37 on 5/11 and 5/18 respectively. BUN 87.  Suspicion for ATN due to carboplatin, losartan/HCTZ and anemia.  Has proteinuria to 11.5 g dysuria concerning for nephrotic syndrome/glomerular pathology although he does not have significant edema.  Renal ultrasound suggestive for CKD.  Serologies including hepatitis panel not impressive.  No schistocytes on smear.  Haptoglobin high.   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  Plan for permanent access by VVS -Continue monitoring renal function -Avoid nephrotoxic meds  Anion gap metabolic acidosis: Likely due to renal failure/azotemia.  Improving. -Defer to nephrology  Leukocytosis: there is some element of hemoconcentration.  No apparent source of infection.  -We will continue monitoring  Iron deficiency anemia/anemia of  chronic disease likely due to chemotherapy and renal failure: Hemoglobin 6.9 on admission.  Transfused 1 unit with appropriate response. No apparent bleeding except for moderate hematuria.  Hemoglobin 7.4 this morning.  Anemia panel suggestive combination of iron deficiency and anemia of chronic disease. -Could benefit from iron infusion and Aransep but defer this to nephrology. -Continue monitoring  Hematuria:  -Outpatient urology follow-up  Chronic CAD: No cardiopulmonary symptoms -Continue home metoprolol, aspirin and Zocor  Essential hypertension: Normotensive.  On amlodipine and metoprolol at home -Increased metoprolol to home dose -Resumed home amlodipine -Hold home losartan/HCTZ in the setting of AKI  Limited stage (T2b, N1, M0) small cell lung cancer: Diagnosed in 01/31/2019 -Recently started chemotherapy with carboplatin, etoposide-received cycle 3 on 5/18. -Outpatient oncology follow-up  Thrombocytopenia: Resolved.  Hypokalemia and hypomagnesemia -Carefully replenish and recheck  Scheduled Meds: . amLODipine  5 mg Oral Daily  . calcitRIOL  0.25 mcg Oral Daily  . Chlorhexidine Gluconate Cloth  6 each Topical Q0600  . levothyroxine  175 mcg Oral QAC breakfast  . metoprolol tartrate  25 mg Oral BID  . pantoprazole  40 mg Oral Daily  . sevelamer carbonate  2.4 g Oral TID WC  . simvastatin  10 mg Oral q1800  . sucralfate  1 g Oral TID WC & HS   Continuous Infusions: . magnesium sulfate bolus IVPB     PRN Meds:.acetaminophen, famotidine, hydrALAZINE, ondansetron (ZOFRAN) IV, polyethylene glycol, senna-docusate, traZODone   DVT prophylaxis: SCD Code Status: Full code Family Communication: patient to let me know if questions from family Disposition Plan: Remains inpatient for acute renal failure.  Starting HD  Consultants:   Nephrology  IR  Procedures:   Renal  biopsy on 6/8  HD cath on 6/9  Microbiology: . COVID-19  negative  Antimicrobials: Anti-infectives (From admission, onward)   Start     Dose/Rate Route Frequency Ordered Stop   04/22/19 1253  vancomycin (VANCOCIN) 1-5 GM/200ML-% IVPB    Note to Pharmacy:  Manuela Neptune   : cabinet override      04/22/19 1253 04/23/19 0059   04/22/19 0745  vancomycin (VANCOCIN) IVPB 1000 mg/200 mL premix     1,000 mg 200 mL/hr over 60 Minutes Intravenous To Radiology 04/22/19 0731 04/22/19 1409      Objective: Vitals:   04/23/19 0456 04/23/19 0505 04/23/19 0544 04/23/19 0951  BP: (!) 156/85 (!) 149/85 135/64 (!) 143/83  Pulse: 70 74 77 82  Resp:  18 18 18   Temp:  98.3 F (36.8 C) 98 F (36.7 C) 98 F (36.7 C)  TempSrc:  Oral Oral Oral  SpO2:  99% 94% 95%  Weight:  89.9 kg    Height:        Intake/Output Summary (Last 24 hours) at 04/23/2019 1249 Last data filed at 04/23/2019 0900 Gross per 24 hour  Intake 910 ml  Output 1700 ml  Net -790 ml   Filed Weights   04/22/19 2115 04/23/19 0249 04/23/19 0505  Weight: 91 kg 90.8 kg 89.9 kg    Examination:  GENERAL: No acute distress.  Appears well.  HEENT: MMM.  Vision and hearing grossly intact.  NECK: Supple.  No JVD.  LUNGS:  No IWOB. Good air movement bilaterally. HEART:  RRR. Heart sounds normal.  ABD: Bowel sounds present. Soft. Non tender.  MSK/EXT:  Moves all extremities. No apparent deformity. No edema bilaterally.  SKIN: no apparent skin lesion or wound NEURO: Awake, alert and oriented appropriately.  No gross deficit.  PSYCH: Calm. Normal affect.  Data Reviewed: I have independently reviewed following labs and imaging studies  CBC: Recent Labs  Lab 04/18/19 0154 04/19/19 0303 04/20/19 0449 04/21/19 0541 04/22/19 0612 04/23/19 0934  WBC 10.7* 10.7* 12.1* 12.1* 15.5* 9.8  NEUTROABS 7.8*  --  8.6*  --   --   --   HGB 7.1* 7.0* 7.4* 7.3* 8.5* 7.4*  HCT 19.8* 20.5* 21.4* 21.1* 25.0* 20.9*  MCV 85.7 87.6 87.3 87.6 88.3 86.4  PLT 72* 97* 133* 165 234 240   Basic Metabolic  Panel: Recent Labs  Lab 04/18/19 0154 04/19/19 0303 04/20/19 0449 04/21/19 0541 04/22/19 0612 04/23/19 0934  NA 135 136 137 135 134* 136  K 3.4* 4.0 3.6 3.9 3.3* 3.1*  CL 101 104 102 102 96* 100  CO2 21* 20* 19* 19* 18* 21*  GLUCOSE 105* 95 101* 94 103* 154*  BUN 69* 72* 74* 79* 87* 51*  CREATININE 11.98* 13.15* 13.78* 14.89* 15.95* 11.18*  CALCIUM 7.3* 7.6* 7.8* 7.8* 8.2* 7.6*  MG 1.8 1.7 1.7 1.7 1.9 1.6*  PHOS 7.5*  --  7.2* 8.5* 8.7* 4.3   GFR: Estimated Creatinine Clearance: 6.6 mL/min (A) (by C-G formula based on SCr of 11.18 mg/dL (H)). Liver Function Tests: Recent Labs  Lab 04/19/19 0303 04/20/19 0449 04/21/19 0541 04/22/19 0612 04/23/19 0934  AST 16 20 21   --  25  ALT 13 17 18   --  25  ALKPHOS 70 74 76  --  74  BILITOT 0.5 0.3 0.4  --  0.6  PROT 4.7* 4.9* 5.0*  --  5.7*  ALBUMIN 1.8* 1.8* 1.7* 2.0* 1.7*   Recent Labs  Lab 04/18/19 0154  LIPASE 22  No results for input(s): AMMONIA in the last 168 hours. Coagulation Profile: Recent Labs  Lab 04/17/19 0951  INR 1.2   Cardiac Enzymes: Recent Labs  Lab 04/23/19 0934  CKTOTAL 72   BNP (last 3 results) No results for input(s): PROBNP in the last 8760 hours. HbA1C: No results for input(s): HGBA1C in the last 72 hours. CBG: No results for input(s): GLUCAP in the last 168 hours. Lipid Profile: Recent Labs    04/22/19 1518  CHOL 136  HDL 25*  LDLCALC 77  TRIG 169*  CHOLHDL 5.4   Thyroid Function Tests: No results for input(s): TSH, T4TOTAL, FREET4, T3FREE, THYROIDAB in the last 72 hours. Anemia Panel: Recent Labs    04/22/19 1518  VITAMINB12 1,007*  FOLATE 11.1  FERRITIN 1,038*  TIBC 141*  IRON 18*  RETICCTPCT 1.5   Urine analysis:    Component Value Date/Time   COLORURINE STRAW (A) 04/15/2019 0913   APPEARANCEUR CLEAR 04/15/2019 0913   LABSPEC 1.010 04/15/2019 0913   PHURINE 7.0 04/15/2019 0913   GLUCOSEU 50 (A) 04/15/2019 0913   HGBUR MODERATE (A) 04/15/2019 0913    BILIRUBINUR NEGATIVE 04/15/2019 0913   KETONESUR NEGATIVE 04/15/2019 0913   PROTEINUR >=300 (A) 04/15/2019 0913   NITRITE NEGATIVE 04/15/2019 0913   LEUKOCYTESUR NEGATIVE 04/15/2019 0913   Sepsis Labs: Invalid input(s): PROCALCITONIN, LACTICIDVEN  Recent Results (from the past 240 hour(s))  SARS Coronavirus 2 (CEPHEID - Performed in [place] hospital lab), Hosp Order     Status: None   Collection Time: 04/14/19  2:13 PM  Result Value Ref Range Status   SARS Coronavirus 2 NEGATIVE NEGATIVE Final    Comment: (NOTE) If result is NEGATIVE SARS-CoV-2 target nucleic acids are NOT DETECTED. The SARS-CoV-2 RNA is generally detectable in upper and lower  respiratory specimens during the acute phase of infection. The lowest  concentration of SARS-CoV-2 viral copies this assay can detect is 250  copies / mL. A negative result does not preclude SARS-CoV-2 infection  and should not be used as the sole basis for treatment or other  patient management decisions.  A negative result may occur with  improper specimen collection / handling, submission of specimen other  than nasopharyngeal swab, presence of viral mutation(s) within the  areas targeted by this assay, and inadequate number of viral copies  (<250 copies / mL). A negative result must be combined with clinical  observations, patient history, and epidemiological information. If result is POSITIVE SARS-CoV-2 target nucleic acids are DETECTED. The SARS-CoV-2 RNA is generally detectable in upper and lower  respiratory specimens dur ing the acute phase of infection.  Positive  results are indicative of active infection with SARS-CoV-2.  Clinical  correlation with patient history and other diagnostic information is  necessary to determine patient infection status.  Positive results do  not rule out bacterial infection or co-infection with other viruses. If result is PRESUMPTIVE POSTIVE SARS-CoV-2 nucleic acids MAY BE PRESENT.   A  presumptive positive result was obtained on the submitted specimen  and confirmed on repeat testing.  While 2019 novel coronavirus  (SARS-CoV-2) nucleic acids may be present in the submitted sample  additional confirmatory testing may be necessary for epidemiological  and / or clinical management purposes  to differentiate between  SARS-CoV-2 and other Sarbecovirus currently known to infect humans.  If clinically indicated additional testing with an alternate test  methodology [phone]) is advised. The SARS-CoV-2 RNA is generally  detectable in upper and lower respiratory sp ecimens during  the acute  phase of infection. The expected result is Negative. Fact Sheet for Patients:  StrictlyIdeas.no Fact Sheet for Healthcare Providers: BankingDealers.co.za This test is not yet approved or cleared by the [place] FDA and has been authorized for detection and/or diagnosis of SARS-CoV-2 by FDA under an Emergency Use Authorization (EUA).  This EUA will remain in effect (meaning this test can be used) for the duration of the COVID-19 declaration under Section 564(b)(1) of the Act, 21 U.S.C. section 360bbb-3(b)(1), unless the authorization is terminated or revoked sooner. Performed at [facility], [address] [place], [place] [address]       Radiology Studies: Ir Fluoro Guide Cv Line Right  Result Date: 04/22/2019 INDICATION: Acute kidney injury, access for dialysis EXAM: ULTRASOUND GUIDANCE FOR VASCULAR ACCESS RIGHT INTERNAL JUGULAR PERMANENT HEMODIALYSIS CATHETER Date:  04/22/2019 04/22/2019 1:31 pm Radiologist:  Jerilynn Mages. Daryll Brod, MD Guidance:  Ultrasound and fluoroscopic FLUOROSCOPY TIME:  Fluoroscopy Time: 0 minutes 32 seconds (4 mGy). MEDICATIONS: 1 g vancomycin within hour of the procedure ANESTHESIA/SEDATION: Versed 0 mg IV; Fentanyl 50 mcg IV; Moderate Sedation Time:  None. The patient was continuously monitored during the procedure by the  interventional radiology nurse under my direct supervision. CONTRAST:  None. COMPLICATIONS: None immediate. PROCEDURE: Informed consent was obtained from the patient following explanation of the procedure, risks, benefits and alternatives. The patient understands, agrees and consents for the procedure. All questions were addressed. A time out was performed. Maximal barrier sterile technique utilized including caps, mask, sterile gowns, sterile gloves, large sterile drape, hand hygiene, and 2% chlorhexidine scrub. Under sterile conditions and local anesthesia, right internal jugular micropuncture venous access was performed with ultrasound. Images were obtained for documentation of the patent right internal jugular vein. A guide wire was inserted followed by a transitional dilator. Next, a 0.035 guidewire was advanced into the IVC with a 5-French catheter. Measurements were obtained from the right venotomy site to the proximal right atrium. In the right infraclavicular chest, a subcutaneous tunnel was created under sterile conditions and local anesthesia. 1% lidocaine with epinephrine was utilized for this. The 23 cm tip to cuff palindrome catheter was tunneled subcutaneously to the venotomy site and inserted into the SVC/RA junction through a valved peel-away sheath. Position was confirmed with fluoroscopy. Images were obtained for documentation. Blood was aspirated from the catheter followed by saline and heparin flushes. The appropriate volume and strength of heparin was instilled in each lumen. Caps were applied. The catheter was secured at the tunnel site with Gelfoam and a pursestring suture. The venotomy site was closed with subcuticular Vicryl suture. Dermabond was applied to the small right neck incision. A dry sterile dressing was applied. The catheter is ready for use. No immediate complications. IMPRESSION: Ultrasound and fluoroscopically guided right internal jugular tunneled hemodialysis catheter (23 cm  tip to cuff palindrome catheter). Electronically Signed   By: Jerilynn Mages.  Shick M.D.   On: 04/22/2019 13:36   Ir US Guide Vasc Access Right  Result Date: 04/22/2019 INDICATION: Acute kidney injury, access for dialysis EXAM: ULTRASOUND GUIDANCE FOR VASCULAR ACCESS RIGHT INTERNAL JUGULAR PERMANENT HEMODIALYSIS CATHETER Date:  04/22/2019 04/22/2019 1:31 pm Radiologist:  Jerilynn Mages. Daryll Brod, MD Guidance:  Ultrasound and fluoroscopic FLUOROSCOPY TIME:  Fluoroscopy Time: 0 minutes 32 seconds (4 mGy). MEDICATIONS: 1 g vancomycin within hour of the procedure ANESTHESIA/SEDATION: Versed 0 mg IV; Fentanyl 50 mcg IV; Moderate Sedation Time:  None. The patient was continuously monitored during the procedure by the interventional radiology nurse under my direct  supervision. CONTRAST:  None. COMPLICATIONS: None immediate. PROCEDURE: Informed consent was obtained from the patient following explanation of the procedure, risks, benefits and alternatives. The patient understands, agrees and consents for the procedure. All questions were addressed. A time out was performed. Maximal barrier sterile technique utilized including caps, mask, sterile gowns, sterile gloves, large sterile drape, hand hygiene, and 2% chlorhexidine scrub. Under sterile conditions and local anesthesia, right internal jugular micropuncture venous access was performed with ultrasound. Images were obtained for documentation of the patent right internal jugular vein. A guide wire was inserted followed by a transitional dilator. Next, a 0.035 guidewire was advanced into the IVC with a 5-French catheter. Measurements were obtained from the right venotomy site to the proximal right atrium. In the right infraclavicular chest, a subcutaneous tunnel was created under sterile conditions and local anesthesia. 1% lidocaine with epinephrine was utilized for this. The 23 cm tip to cuff palindrome catheter was tunneled subcutaneously to the venotomy site and inserted into the SVC/RA  junction through a valved peel-away sheath. Position was confirmed with fluoroscopy. Images were obtained for documentation. Blood was aspirated from the catheter followed by saline and heparin flushes. The appropriate volume and strength of heparin was instilled in each lumen. Caps were applied. The catheter was secured at the tunnel site with Gelfoam and a pursestring suture. The venotomy site was closed with subcuticular Vicryl suture. Dermabond was applied to the small right neck incision. A dry sterile dressing was applied. The catheter is ready for use. No immediate complications. IMPRESSION: Ultrasound and fluoroscopically guided right internal jugular tunneled hemodialysis catheter (23 cm tip to cuff palindrome catheter). Electronically Signed   By: Jerilynn Mages.  Shick M.D.   On: 04/22/2019 13:36     Taye T. [facility] Pager [phone]  If 7PM-7AM, please contact night-coverage [email or web address] Password [facility] 04/23/2019, 12:49 PM

## 2019-04-24 ENCOUNTER — Ambulatory Visit: Payer: Medicare Other

## 2019-04-24 ENCOUNTER — Telehealth: Payer: Self-pay | Admitting: *Deleted

## 2019-04-24 ENCOUNTER — Inpatient Hospital Stay (HOSPITAL_COMMUNITY): Payer: Medicare Other

## 2019-04-24 DIAGNOSIS — N186 End stage renal disease: Secondary | ICD-10-CM

## 2019-04-24 LAB — RENAL FUNCTION PANEL
Albumin: 1.7 g/dL — ABNORMAL LOW (ref 3.5–5.0)
Anion gap: 14 (ref 5–15)
BUN: 57 mg/dL — ABNORMAL HIGH (ref 8–23)
CO2: 20 mmol/L — ABNORMAL LOW (ref 22–32)
Calcium: 7.7 mg/dL — ABNORMAL LOW (ref 8.9–10.3)
Chloride: 100 mmol/L (ref 98–111)
Creatinine, Ser: 12.54 mg/dL — ABNORMAL HIGH (ref 0.61–1.24)
GFR calc Af Amer: 4 mL/min — ABNORMAL LOW (ref >60)
GFR calc non Af Amer: 4 mL/min — ABNORMAL LOW (ref >60)
Glucose, Bld: 101 mg/dL — ABNORMAL HIGH (ref 70–99)
Phosphorus: 5.2 mg/dL — ABNORMAL HIGH (ref 2.5–4.6)
Potassium: 3.5 mmol/L (ref 3.5–5.1)
Sodium: 134 mmol/L — ABNORMAL LOW (ref 135–145)

## 2019-04-24 LAB — CBC
HCT: 20.5 % — ABNORMAL LOW (ref 39.0–52.0)
Hemoglobin: 7 g/dL — ABNORMAL LOW (ref 13.0–17.0)
MCH: 30.7 pg (ref 26.0–34.0)
MCHC: 34.1 g/dL (ref 30.0–36.0)
MCV: 89.9 fL (ref 80.0–100.0)
Platelets: 233 10*3/uL (ref 150–400)
RBC: 2.28 MIL/uL — ABNORMAL LOW (ref 4.22–5.81)
RDW: 15.9 % — ABNORMAL HIGH (ref 11.5–15.5)
WBC: 10.3 10*3/uL (ref 4.0–10.5)
nRBC: 0 % (ref 0.0–0.2)

## 2019-04-24 LAB — HEPATITIS B SURFACE ANTIBODY, QUANTITATIVE: Hep B S AB Quant (Post): <3.1 m[IU]/mL — ABNORMAL LOW (ref >9.9)

## 2019-04-24 LAB — ABO/RH: ABO/RH(D): O POS

## 2019-04-24 LAB — PREPARE RBC (CROSSMATCH)

## 2019-04-24 LAB — HEPATITIS B CORE ANTIBODY, TOTAL: Hep B Core Total Ab: NEGATIVE

## 2019-04-24 LAB — MAGNESIUM: Magnesium: 2.1 mg/dL (ref 1.7–2.4)

## 2019-04-24 MED ORDER — SEVELAMER CARBONATE 800 MG PO TABS
1600.0000 mg | ORAL_TABLET | Freq: Three times a day (TID) | ORAL | Status: DC
  Administered 2019-04-25 – 2019-04-26 (×2): 1600 mg via ORAL
  Filled 2019-04-24 (×2): qty 2

## 2019-04-24 MED ORDER — DARBEPOETIN ALFA 100 MCG/0.5ML IJ SOSY
PREFILLED_SYRINGE | INTRAMUSCULAR | Status: AC
  Filled 2019-04-24: qty 0.5

## 2019-04-24 MED ORDER — SODIUM CHLORIDE 0.9% IV SOLUTION
Freq: Once | INTRAVENOUS | Status: AC
  Administered 2019-04-24: 18:00:00 via INTRAVENOUS

## 2019-04-24 MED ORDER — SODIUM CHLORIDE 0.9 % IV SOLN: 510.0000 mg | Freq: Once | INTRAVENOUS | Status: DC

## 2019-04-24 MED ORDER — HEPARIN SODIUM (PORCINE) 1000 UNIT/ML IJ SOLN
INTRAMUSCULAR | Status: AC
  Administered 2019-04-24: 3800 [IU] via INTRAVENOUS
  Filled 2019-04-24: qty 4

## 2019-04-24 MED ORDER — DARBEPOETIN ALFA 100 MCG/0.5ML IJ SOSY
100.0000 ug | PREFILLED_SYRINGE | INTRAMUSCULAR | Status: DC
  Administered 2019-04-24: 100 ug via INTRAVENOUS

## 2019-04-24 MED ORDER — HEPARIN SODIUM (PORCINE) 1000 UNIT/ML IJ SOLN
1000.0000 [IU] | INTRAMUSCULAR | Status: DC | PRN
  Administered 2019-04-24: 16:00:00 3800 [IU] via INTRAVENOUS
  Administered 2019-04-26: 1000 [IU] via INTRAVENOUS
  Filled 2019-04-24: qty 1

## 2019-04-24 MED ORDER — VANCOMYCIN HCL IN DEXTROSE 1-5 GM/200ML-% IV SOLN
1000.0000 mg | INTRAVENOUS | Status: DC
  Filled 2019-04-24 (×2): qty 200

## 2019-04-24 MED ORDER — SODIUM CHLORIDE 0.9 % IV SOLN
250.0000 mg | Freq: Every day | INTRAVENOUS | Status: AC
  Administered 2019-04-24 – 2019-04-26 (×2): 250 mg via INTRAVENOUS
  Filled 2019-04-24 (×3): qty 20

## 2019-04-24 NOTE — H&P (View-Only) (Signed)
Patient ID: Jeremy Johnson, male   DOB: [birth year], 73 y.o.   MRN: [id number] Comfortable today.  No distress. Vein map pending with left arm being restricted. Discussed plan for AV fistula versus AV graft tomorrow in the early afternoon

## 2019-04-24 NOTE — Telephone Encounter (Signed)
Received call from Renal navigator, Garth Bigness.  She is trying to coordinate pt's new dialysis schedule with his chemo and radiation. He is schedule for chemo 6/29, 6/30 and 7/1.  He also has radiation treatments n those days as well. The only day that is a problem is 6/30.  He has dialysis on Tuesdays, Thursdays and Saturdays. The plan is for HD in the morning of 6/30 in [place] and then pt is to have chemo and radiation that afternoon.  Also he will need radiation earlier in the day on 05/15/19. Scheduling message sent to change time of chemo on 6/30.  Message sent to [facility] in rad/onc to adjust his radiation schedule on 6/30 and 7/2

## 2019-04-24 NOTE — Progress Notes (Signed)
Patient ID: Jeremy Johnson, male   DOB: November 25, 1945, 73 y.o.   MRN: [id number] Comfortable today.  No distress. Vein map pending with left arm being restricted. Discussed plan for AV fistula versus AV graft tomorrow in the early afternoon

## 2019-04-24 NOTE — Care Management Important Message (Signed)
Important Message  Patient Details  Name: Jeremy Johnson MRN: [id number] Date of Birth: [birth year]   Medicare Important Message Given:  Yes    Orbie Pyo 04/24/2019, 1:36 PM

## 2019-04-24 NOTE — Progress Notes (Signed)
Bilateral upper extremity vein mapping has been completed. Preliminary results can be found in CV Proc through chart review.   04/24/19 11:52 AM Jeremy Johnson RVT

## 2019-04-24 NOTE — Progress Notes (Signed)
PROGRESS NOTE  Jeremy Johnson IOM:[id number] DOB: 02/28/1946 DOA: 04/14/2019 PCP: [name], MD   LOS: 10 days   Patient is from: Home  Brief Narrative / Interim history: 73 year old with history of limited stage small cell lung cancer, anemia of chronic disease, CKD-3, essential HTN hypothyroidism, HLD and CAD who was sent to the hospital by oncologist for evaluation of AKI with creatinine to 9.5 and anemia with Hgb to 6.9. Patient was transferred to [facility] for possible renal biopsy and initiation of dialysis.  Renal function continued to worsen. Patient underwent renal biopsy on 04/21/2019.  HD cath placed on 6/9 hemodialysis started on 6/10.  Subjective: No major events overnight of this morning.  No complaint this morning.  Denies chest pain, dyspnea, GI or GU symptoms.  Reports making fair amount of urine.   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 Cr 2.79 and 3.37 on 5/11 and 5/18 respectively. -ATN from carboplatin, losartan/HCTZ and anemia could contribute. -Serologies including hepatitis panel not impressive.  No schistocytes on smear.  Haptoglobin high.   -Appreciate nephrology guidance -Patient is not pursuing treatment for anti-GBM after risk-benefit discussion with nephrology. -HD started on 6/10.   -Plan for permanent access by VVS on 6/12 -Continue monitoring renal function -Avoid nephrotoxic meds -Needs hepatitis B vaccine  Anion gap metabolic acidosis: Likely due to renal failure/azotemia.  Improving. -Defer to nephrology  Leukocytosis: there is some element of hemoconcentration.  No apparent source of infection.  Resolved.  Iron deficiency anemia/anemia of chronic disease likely due to chemotherapy and renal failure: Hemoglobin 6.9 on admission.  Transfused 1 unit with appropriate response. No apparent bleeding except for moderate hematuria.   Hemoglobin trended down to 7.0 this morning.  Anemia panel suggestive combination of iron deficiency and anemia of chronic disease. -Transfuse 1 unit -Could benefit from iron infusion and Aransep but defer this to nephrology. -Continue monitoring -FOBT  Hematuria: ?due to GBM -Recheck UA -Outpatient urology follow-up  Chronic CAD: No cardiopulmonary symptoms -Continue home metoprolol, aspirin and Zocor  Essential hypertension: Normotensive.  On amlodipine and metoprolol at home -Increased metoprolol to home dose -Resumed home amlodipine -Hold home losartan/HCTZ in the setting of AKI  Limited stage (T2b, N1, M0) small cell lung cancer: Diagnosed in 01/31/2019 -Recently started chemotherapy with carboplatin, etoposide-received cycle 3 on 5/18. -Outpatient oncology follow-up  Thrombocytopenia: Resolved.  Hypokalemia and hypomagnesemia -Carefully replenish and recheck  Scheduled Meds: . amLODipine  5 mg Oral Daily  . calcitRIOL  0.25 mcg Oral Daily  . Chlorhexidine Gluconate Cloth  6 each Topical Q0600  . levothyroxine  175 mcg Oral QAC breakfast  . metoprolol tartrate  25 mg Oral BID  . pantoprazole  40 mg Oral Daily  . sevelamer carbonate  2.4 g Oral TID WC  . simvastatin  10 mg Oral q1800  . sucralfate  1 g Oral TID WC & HS   Continuous Infusions:  PRN Meds:.acetaminophen, famotidine, hydrALAZINE, ondansetron (ZOFRAN) IV, polyethylene glycol, senna-docusate, traZODone   DVT prophylaxis: SCD Code Status: Full code Family Communication: patient to let me know if questions from family Disposition Plan: Remains inpatient for acute renal failure.  Starting HD  Consultants:   Nephrology  IR  VVS  Procedures:   Renal biopsy on 6/8  HD cath on 6/9  Microbiology: . COVID-19 negative  Antimicrobials: Anti-infectives (From admission, onward)   Start  Dose/Rate Route Frequency Ordered Stop   04/22/19 1253  vancomycin (VANCOCIN) 1-5 GM/200ML-% IVPB    Note to  Pharmacy: Manuela Neptune   : cabinet override      04/22/19 1253 04/23/19 0059   04/22/19 0745  vancomycin (VANCOCIN) IVPB 1000 mg/200 mL premix     1,000 mg 200 mL/hr over 60 Minutes Intravenous To Radiology 04/22/19 0731 04/22/19 1409      Objective: Vitals:   04/23/19 1634 04/23/19 2039 04/24/19 0458 04/24/19 0500  BP: 132/60 123/65 (!) 154/86   Pulse: 75 76 80   Resp: 18 18 18    Temp: 98 F (36.7 C) 98.4 F (36.9 C) 98.3 F (36.8 C)   TempSrc: Oral Oral Oral   SpO2: 97% 95% 96%   Weight:  89.9 kg  89.9 kg  Height:        Intake/Output Summary (Last 24 hours) at 04/24/2019 0751 Last data filed at 04/24/2019 0600 Gross per 24 hour  Intake 580 ml  Output 850 ml  Net -270 ml   Filed Weights   04/23/19 0505 04/23/19 2039 04/24/19 0500  Weight: 89.9 kg 89.9 kg 89.9 kg    Examination:  GENERAL: No acute distress.  Appears well.  HEENT: MMM.  Vision and hearing grossly intact.  NECK: Supple.  No JVD.  LUNGS:  No IWOB. Good air movement bilaterally. HEART:  RRR. Heart sounds normal.  ABD: Bowel sounds present. Soft. Non tender.  MSK/EXT:  Moves all extremities. No apparent deformity. No edema bilaterally.  SKIN: no apparent skin lesion or wound NEURO: Awake, alert and oriented appropriately.  No gross deficit.  PSYCH: Calm. Normal affect.  Data Reviewed: I have independently reviewed following labs and imaging studies  CBC: Recent Labs  Lab 04/18/19 0154 04/19/19 0303 04/20/19 0449 04/21/19 0541 04/22/19 0612 04/23/19 0934  WBC 10.7* 10.7* 12.1* 12.1* 15.5* 9.8  NEUTROABS 7.8*  --  8.6*  --   --   --   HGB 7.1* 7.0* 7.4* 7.3* 8.5* 7.4*  HCT 19.8* 20.5* 21.4* 21.1* 25.0* 20.9*  MCV 85.7 87.6 87.3 87.6 88.3 86.4  PLT 72* 97* 133* 165 234 097   Basic Metabolic Panel: Recent Labs  Lab 04/18/19 0154 04/19/19 0303 04/20/19 0449 04/21/19 0541 04/22/19 0612 04/23/19 0934  NA 135 136 137 135 134* 136  K 3.4* 4.0 3.6 3.9 3.3* 3.1*  CL 101 104 102 102 96*  100  CO2 21* 20* 19* 19* 18* 21*  GLUCOSE 105* 95 101* 94 103* 154*  BUN 69* 72* 74* 79* 87* 51*  CREATININE 11.98* 13.15* 13.78* 14.89* 15.95* 11.18*  CALCIUM 7.3* 7.6* 7.8* 7.8* 8.2* 7.6*  MG 1.8 1.7 1.7 1.7 1.9 1.6*  PHOS 7.5*  --  7.2* 8.5* 8.7* 4.3   GFR: Estimated Creatinine Clearance: 6.6 mL/min (A) (by C-G formula based on SCr of 11.18 mg/dL (H)). Liver Function Tests: Recent Labs  Lab 04/19/19 0303 04/20/19 0449 04/21/19 0541 04/22/19 0612 04/23/19 0934  AST 16 20 21   --  25  ALT 13 17 18   --  25  ALKPHOS 70 74 76  --  74  BILITOT 0.5 0.3 0.4  --  0.6  PROT 4.7* 4.9* 5.0*  --  5.7*  ALBUMIN 1.8* 1.8* 1.7* 2.0* 1.7*   Recent Labs  Lab 04/18/19 0154  LIPASE 22   No results for input(s): AMMONIA in the last 168 hours. Coagulation Profile: Recent Labs  Lab 04/17/19 0951  INR 1.2   Cardiac Enzymes: Recent  Labs  Lab 04/23/19 0934  CKTOTAL 72   BNP (last 3 results) No results for input(s): PROBNP in the last 8760 hours. HbA1C: No results for input(s): HGBA1C in the last 72 hours. CBG: No results for input(s): GLUCAP in the last 168 hours. Lipid Profile: Recent Labs    04/22/19 1518  CHOL 136  HDL 25*  LDLCALC 77  TRIG 169*  CHOLHDL 5.4   Thyroid Function Tests: No results for input(s): TSH, T4TOTAL, FREET4, T3FREE, THYROIDAB in the last 72 hours. Anemia Panel: Recent Labs    04/22/19 1518  VITAMINB12 1,007*  FOLATE 11.1  FERRITIN 1,038*  TIBC 141*  IRON 18*  RETICCTPCT 1.5   Urine analysis:    Component Value Date/Time   COLORURINE STRAW (A) 04/15/2019 0913   APPEARANCEUR CLEAR 04/15/2019 0913   LABSPEC 1.010 04/15/2019 0913   PHURINE 7.0 04/15/2019 0913   GLUCOSEU 50 (A) 04/15/2019 0913   HGBUR MODERATE (A) 04/15/2019 0913   BILIRUBINUR NEGATIVE 04/15/2019 0913   KETONESUR NEGATIVE 04/15/2019 0913   PROTEINUR >=300 (A) 04/15/2019 0913   NITRITE NEGATIVE 04/15/2019 0913   LEUKOCYTESUR NEGATIVE 04/15/2019 0913   Sepsis  Labs: Invalid input(s): PROCALCITONIN, LACTICIDVEN  Recent Results (from the past 240 hour(s))  SARS Coronavirus 2 (CEPHEID - Performed in [facility] hospital lab), Hosp Order     Status: None   Collection Time: 04/14/19  2:13 PM   Specimen: Nasopharyngeal Swab  Result Value Ref Range Status   SARS Coronavirus 2 NEGATIVE NEGATIVE Final    Comment: (NOTE) If result is NEGATIVE SARS-CoV-2 target nucleic acids are NOT DETECTED. The SARS-CoV-2 RNA is generally detectable in upper and lower  respiratory specimens during the acute phase of infection. The lowest  concentration of SARS-CoV-2 viral copies this assay can detect is 250  copies / mL. A negative result does not preclude SARS-CoV-2 infection  and should not be used as the sole basis for treatment or other  patient management decisions.  A negative result may occur with  improper specimen collection / handling, submission of specimen other  than nasopharyngeal swab, presence of viral mutation(s) within the  areas targeted by this assay, and inadequate number of viral copies  (<250 copies / mL). A negative result must be combined with clinical  observations, patient history, and epidemiological information. If result is POSITIVE SARS-CoV-2 target nucleic acids are DETECTED. The SARS-CoV-2 RNA is generally detectable in upper and lower  respiratory specimens dur ing the acute phase of infection.  Positive  results are indicative of active infection with SARS-CoV-2.  Clinical  correlation with patient history and other diagnostic information is  necessary to determine patient infection status.  Positive results do  not rule out bacterial infection or co-infection with other viruses. If result is PRESUMPTIVE POSTIVE SARS-CoV-2 nucleic acids MAY BE PRESENT.   A presumptive positive result was obtained on the submitted specimen  and confirmed on repeat testing.  While 2019 novel coronavirus  (SARS-CoV-2) nucleic acids may be present  in the submitted sample  additional confirmatory testing may be necessary for epidemiological  and / or clinical management purposes  to differentiate between  SARS-CoV-2 and other Sarbecovirus currently known to infect humans.  If clinically indicated additional testing with an alternate test  methodology [phone]) is advised. The SARS-CoV-2 RNA is generally  detectable in upper and lower respiratory sp ecimens during the acute  phase of infection. The expected result is Negative. Fact Sheet for Patients:  StrictlyIdeas.no Fact Sheet for Healthcare  Providers: BankingDealers.co.za This test is not yet approved or cleared by the [place] and has been authorized for detection and/or diagnosis of SARS-CoV-2 by FDA under an Emergency Use Authorization (EUA).  This EUA will remain in effect (meaning this test can be used) for the duration of the COVID-19 declaration under Section 564(b)(1) of the Act, 21 U.S.C. section 360bbb-3(b)(1), unless the authorization is terminated or revoked sooner. Performed at [facility], [address], [place],  [address]       Radiology Studies: No results found.   [facility] Pager [phone]  If 7PM-7AM, please contact night-coverage [email or web address] Password [facility] 04/24/2019, 7:51 AM

## 2019-04-24 NOTE — Progress Notes (Addendum)
[facility] Progress Note    Assessment/ Plan:   73 y.o.yo malewith HTN, HLD, AAA s/p repair in 2006causing damage to the kidney and then CKD with baseline creatinine around2.5per patient, recently dx with SCC of lung s/p 3 cycles of carboplatin and etoposide and RT, last chemo on 5/18, sent to the hospital for abnormal labs including hemoglobin 6.9 and serum creatinine level 9.86.  #Acute kidney injury on CKD- on 5/11 crt was 2.79, 5/18 3.37 then just worse from there. possiblyATN due to carboplatin, losartan/HCTZand anemia: Other differential could be glomerulonephritis due to Neulasta vs membranous GN due to cancer vs rare possibility of chemo induced TMA. Urinalysis with proteinuria- over 11 grams and hematuria. -Patient is asymptomatic and had good urine output despite of worsening serum creatinine level to 13 today. Rate of rise is less?No absoluteHD indications but starting to get to an uncomfortable range  serologies- c3, c4 WNL, ANCA neg , ANA negative , antiGBM ab negative , hep B, C, HIV all negative , K/l ratio is 1.45. - Haptoglobin high , INR and LDH slightly high. No mention of schistocytes in CBC differential.  -US renal with increased echogenicity consistent with CKD.  - Appreciate Dr. Annamaria Boots performing the kidney biopsy for the definitive diagnosis.No bleeding noted overnight. - Biopsy shows anti-GBM w/ 100% crescents; chances of recovery with aggressive tx (PLEX, high dose steroids, CTX) is very low. D/w pt and risk of sepsis far outweights low potential for recovery.    Appreciate  VIR  Placing RIJ tunneled; Dr. Donnetta Hutching has already seen the pt and has pt on for a permanent access placement Fri.  Plan on HD #2 today and then will not HD till Sat.  We have already started CLIP process but no center yet.  Hyperphosphatemia- change to renvela 2400mg  TIDM, will also check a CK. Phos is better now. Recheck Sat.  #Hypertension:  Monitor BP.Continue metoprolol. Seems fine.    #Anemia due to chemotherapy: Received 2 units of PRBC transfusion on 04/14/2019. Monitor CBC. Drifting down again. Feraheme x1 + Aranesp ordered for today.  [name] #Thrombocytopenia: Due to chemo. No sign of bleeding. Inc nicely   #Small cell lung cancer: On chemotherapy. Per oncology - spoke w/ Dr. Julien Nordmann and he will need 1 more cycle (would be the 4th) + XRT.   Subjective:   Denies f/c/v but is Intermittently nauseous with a poor appetite. Denies dyspnea. No blood in urine overnight.  He is ready to go home.   Objective:   BP 137/79 (BP Location: Right Arm)   Pulse 75   Temp 98.3 F (36.8 C) (Oral)   Resp 14   Ht 5\' 9"  (1.753 m)   Wt 89.9 kg   SpO2 97%   BMI 29.27 kg/m   Intake/Output Summary (Last 24 hours) at 04/24/2019 1202 Last data filed at 04/24/2019 9741 Gross per 24 hour  Intake 490 ml  Output 850 ml  Net -360 ml   Weight change: -1.1 kg  Physical Exam: General: Not in distress, comfortable Heart:RRR, s1s2 nl no rubs Lungs: Bilateral, no wheezing or crackle Abdomen:soft, Non-tender, non-distended Extremities:traceedema Neurology: Alert, awake, nonfocal, no asterixis Skin:No rash or ulcer Access:RIJ TC  Imaging: Ir Fluoro Guide Cv Line Right  Result Date: 04/22/2019 INDICATION: Acute kidney injury, access for dialysis EXAM: ULTRASOUND GUIDANCE FOR VASCULAR ACCESS RIGHT INTERNAL JUGULAR PERMANENT HEMODIALYSIS CATHETER Date:  04/22/2019 04/22/2019 1:31 pm Radiologist:  Jerilynn Mages. Daryll Brod, MD Guidance:  Ultrasound and fluoroscopic FLUOROSCOPY TIME:  Fluoroscopy Time:  0 minutes 32 seconds (4 mGy). MEDICATIONS: 1 g vancomycin within hour of the procedure ANESTHESIA/SEDATION: Versed 0 mg IV; Fentanyl 50 mcg IV; Moderate Sedation Time:  None. The patient was continuously monitored during the procedure by the interventional radiology nurse under my direct supervision. CONTRAST:  None. COMPLICATIONS: None immediate.  PROCEDURE: Informed consent was obtained from the patient following explanation of the procedure, risks, benefits and alternatives. The patient understands, agrees and consents for the procedure. All questions were addressed. A time out was performed. Maximal barrier sterile technique utilized including caps, mask, sterile gowns, sterile gloves, large sterile drape, hand hygiene, and 2% chlorhexidine scrub. Under sterile conditions and local anesthesia, right internal jugular micropuncture venous access was performed with ultrasound. Images were obtained for documentation of the patent right internal jugular vein. A guide wire was inserted followed by a transitional dilator. Next, a 0.035 guidewire was advanced into the IVC with a 5-French catheter. Measurements were obtained from the right venotomy site to the proximal right atrium. In the right infraclavicular chest, a subcutaneous tunnel was created under sterile conditions and local anesthesia. 1% lidocaine with epinephrine was utilized for this. The 23 cm tip to cuff palindrome catheter was tunneled subcutaneously to the venotomy site and inserted into the SVC/RA junction through a valved peel-away sheath. Position was confirmed with fluoroscopy. Images were obtained for documentation. Blood was aspirated from the catheter followed by saline and heparin flushes. The appropriate volume and strength of heparin was instilled in each lumen. Caps were applied. The catheter was secured at the tunnel site with Gelfoam and a pursestring suture. The venotomy site was closed with subcuticular Vicryl suture. Dermabond was applied to the small right neck incision. A dry sterile dressing was applied. The catheter is ready for use. No immediate complications. IMPRESSION: Ultrasound and fluoroscopically guided right internal jugular tunneled hemodialysis catheter (23 cm tip to cuff palindrome catheter). Electronically Signed   By: Jerilynn Mages.  Shick M.D.   On: 04/22/2019 13:36   Ir  US Guide Vasc Access Right  Result Date: 04/22/2019 INDICATION: Acute kidney injury, access for dialysis EXAM: ULTRASOUND GUIDANCE FOR VASCULAR ACCESS RIGHT INTERNAL JUGULAR PERMANENT HEMODIALYSIS CATHETER Date:  04/22/2019 04/22/2019 1:31 pm Radiologist:  Jerilynn Mages. Daryll Brod, MD Guidance:  Ultrasound and fluoroscopic FLUOROSCOPY TIME:  Fluoroscopy Time: 0 minutes 32 seconds (4 mGy). MEDICATIONS: 1 g vancomycin within hour of the procedure ANESTHESIA/SEDATION: Versed 0 mg IV; Fentanyl 50 mcg IV; Moderate Sedation Time:  None. The patient was continuously monitored during the procedure by the interventional radiology nurse under my direct supervision. CONTRAST:  None. COMPLICATIONS: None immediate. PROCEDURE: Informed consent was obtained from the patient following explanation of the procedure, risks, benefits and alternatives. The patient understands, agrees and consents for the procedure. All questions were addressed. A time out was performed. Maximal barrier sterile technique utilized including caps, mask, sterile gowns, sterile gloves, large sterile drape, hand hygiene, and 2% chlorhexidine scrub. Under sterile conditions and local anesthesia, right internal jugular micropuncture venous access was performed with ultrasound. Images were obtained for documentation of the patent right internal jugular vein. A guide wire was inserted followed by a transitional dilator. Next, a 0.035 guidewire was advanced into the IVC with a 5-French catheter. Measurements were obtained from the right venotomy site to the proximal right atrium. In the right infraclavicular chest, a subcutaneous tunnel was created under sterile conditions and local anesthesia. 1% lidocaine with epinephrine was utilized for this. The 23 cm tip to cuff palindrome catheter was tunneled subcutaneously  to the venotomy site and inserted into the SVC/RA junction through a valved peel-away sheath. Position was confirmed with fluoroscopy. Images were obtained for  documentation. Blood was aspirated from the catheter followed by saline and heparin flushes. The appropriate volume and strength of heparin was instilled in each lumen. Caps were applied. The catheter was secured at the tunnel site with Gelfoam and a pursestring suture. The venotomy site was closed with subcuticular Vicryl suture. Dermabond was applied to the small right neck incision. A dry sterile dressing was applied. The catheter is ready for use. No immediate complications. IMPRESSION: Ultrasound and fluoroscopically guided right internal jugular tunneled hemodialysis catheter (23 cm tip to cuff palindrome catheter). Electronically Signed   By: Jerilynn Mages.  Shick M.D.   On: 04/22/2019 13:36    Labs: BMET Recent Labs  Lab 04/18/19 0154 04/19/19 0303 04/20/19 0449 04/21/19 0541 04/22/19 0612 04/23/19 0934 04/24/19 0631  NA 135 136 137 135 134* 136 134*  K 3.4* 4.0 3.6 3.9 3.3* 3.1* 3.5  CL 101 104 102 102 96* 100 100  CO2 21* 20* 19* 19* 18* 21* 20*  GLUCOSE 105* 95 101* 94 103* 154* 101*  BUN 69* 72* 74* 79* 87* 51* 57*  CREATININE 11.98* 13.15* 13.78* 14.89* 15.95* 11.18* 12.54*  CALCIUM 7.3* 7.6* 7.8* 7.8* 8.2* 7.6* 7.7*  PHOS 7.5*  --  7.2* 8.5* 8.7* 4.3 5.2*   CBC Recent Labs  Lab 04/18/19 0154  04/20/19 0449 04/21/19 0541 04/22/19 0612 04/23/19 0934  WBC 10.7*   < > 12.1* 12.1* 15.5* 9.8  NEUTROABS 7.8*  --  8.6*  --   --   --   HGB 7.1*   < > 7.4* 7.3* 8.5* 7.4*  HCT 19.8*   < > 21.4* 21.1* 25.0* 20.9*  MCV 85.7   < > 87.3 87.6 88.3 86.4  PLT 72*   < > 133* 165 234 211   < > = values in this interval not displayed.    Medications:    . amLODipine  5 mg Oral Daily  . calcitRIOL  0.25 mcg Oral Daily  . Chlorhexidine Gluconate Cloth  6 each Topical Q0600  . levothyroxine  175 mcg Oral QAC breakfast  . metoprolol tartrate  25 mg Oral BID  . pantoprazole  40 mg Oral Daily  . sevelamer carbonate  2.4 g Oral TID WC  . simvastatin  10 mg Oral q1800  . sucralfate  1 g Oral  TID WC & HS      Otelia Santee, MD 04/24/2019, 12:02 PM

## 2019-04-24 NOTE — Progress Notes (Signed)
Patient has been accepted at [facility] on a TTS schedule with a seat time of 12:15pm. Renal Navigator spoke with Clinic Manager/J. Cronic who states she cannot give patient a first shift seat, although Renal Navigator has explained conflict with cancer treatments. Clinic Manager states she can move patient's seat time on days that his dialysis conflicts with cancer treatments. Patient needs to arrive at 11:30am on his first day of treatment to sign paperwork. If first day of treatment falls on a Saturday, he will need to go to the clinic the Friday prior to complete paperwork. Renal Navigator spoke with patient's wife/Betty Lamica to explain above. She states understanding and appreciation. Renal Navigator called RN at [facility] to discuss also. Patient's cancer treatment will conflict with dialysis on 05/12/19 and 05/15/19. Dialysis seat time will be moved to an earlier time, per Clinic Manager on 6/29 to accommodate Chemo and Radiation. Cancer Center RN will reschedule Chemo to 1pm that day and will send message to Radiation RN to reschedule Radiation to after Chemo on 6/29 and move Radiation to early in the am on 05/15/19.  Patient is cleared for discharge from an OP HD standpoint and Renal Navigator will follow for discharge plan in order to notify OP HD clinic of start date.  Alphonzo Cruise, [place] Renal Navigator [phone]

## 2019-04-25 ENCOUNTER — Ambulatory Visit: Payer: Medicare Other

## 2019-04-25 ENCOUNTER — Inpatient Hospital Stay (HOSPITAL_COMMUNITY): Payer: Medicare Other | Admitting: Registered Nurse

## 2019-04-25 ENCOUNTER — Encounter (HOSPITAL_COMMUNITY): Payer: Self-pay

## 2019-04-25 ENCOUNTER — Telehealth: Payer: Self-pay | Admitting: Physician Assistant

## 2019-04-25 ENCOUNTER — Encounter (HOSPITAL_COMMUNITY)
Admission: EM | Disposition: A | Discharge status: Home / Self Care | Payer: Self-pay | Source: Home / Self Care | Attending: Student

## 2019-04-25 DIAGNOSIS — C3490 Malignant neoplasm of unspecified part of unspecified bronchus or lung: Secondary | ICD-10-CM | POA: Insufficient documentation

## 2019-04-25 DIAGNOSIS — K432 Incisional hernia without obstruction or gangrene: Secondary | ICD-10-CM | POA: Insufficient documentation

## 2019-04-25 DIAGNOSIS — K219 Gastro-esophageal reflux disease without esophagitis: Secondary | ICD-10-CM | POA: Insufficient documentation

## 2019-04-25 DIAGNOSIS — M1000 Idiopathic gout, unspecified site: Secondary | ICD-10-CM | POA: Insufficient documentation

## 2019-04-25 DIAGNOSIS — N189 Chronic kidney disease, unspecified: Secondary | ICD-10-CM | POA: Insufficient documentation

## 2019-04-25 DIAGNOSIS — N185 Chronic kidney disease, stage 5: Secondary | ICD-10-CM

## 2019-04-25 DIAGNOSIS — I2510 Atherosclerotic heart disease of native coronary artery without angina pectoris: Secondary | ICD-10-CM | POA: Insufficient documentation

## 2019-04-25 DIAGNOSIS — E7849 Other hyperlipidemia: Secondary | ICD-10-CM | POA: Insufficient documentation

## 2019-04-25 DIAGNOSIS — G3189 Other specified degenerative diseases of nervous system: Secondary | ICD-10-CM | POA: Insufficient documentation

## 2019-04-25 DIAGNOSIS — D631 Anemia in chronic kidney disease: Secondary | ICD-10-CM | POA: Insufficient documentation

## 2019-04-25 DIAGNOSIS — N186 End stage renal disease: Secondary | ICD-10-CM | POA: Insufficient documentation

## 2019-04-25 DIAGNOSIS — E039 Hypothyroidism, unspecified: Secondary | ICD-10-CM | POA: Insufficient documentation

## 2019-04-25 DIAGNOSIS — I10 Essential (primary) hypertension: Secondary | ICD-10-CM | POA: Insufficient documentation

## 2019-04-25 HISTORY — PX: AV FISTULA PLACEMENT: SHX1204

## 2019-04-25 LAB — TYPE AND SCREEN
ABO/RH(D): O POS
Antibody Screen: NEGATIVE
Unit division: 0

## 2019-04-25 LAB — SURGICAL PCR SCREEN
MRSA, PCR: NEGATIVE
Staphylococcus aureus: NEGATIVE

## 2019-04-25 LAB — PROTIME-INR
INR: 1.1 (ref 0.8–1.2)
Prothrombin Time: 14.3 seconds (ref 11.4–15.2)

## 2019-04-25 LAB — CBC
HCT: 24.3 % — ABNORMAL LOW (ref 39.0–52.0)
HCT: 27.6 % — ABNORMAL LOW (ref 39.0–52.0)
Hemoglobin: 8.4 g/dL — ABNORMAL LOW (ref 13.0–17.0)
Hemoglobin: 9.4 g/dL — ABNORMAL LOW (ref 13.0–17.0)
MCH: 30.1 pg (ref 26.0–34.0)
MCH: 30.2 pg (ref 26.0–34.0)
MCHC: 34.6 g/dL (ref 30.0–36.0)
MCHC: 34.1 g/dL (ref 30.0–36.0)
MCV: 87.1 fL (ref 80.0–100.0)
MCV: 88.7 fL (ref 80.0–100.0)
Platelets: 252 10*3/uL (ref 150–400)
Platelets: 267 10*3/uL (ref 150–400)
RBC: 2.79 MIL/uL — ABNORMAL LOW (ref 4.22–5.81)
RBC: 3.11 MIL/uL — ABNORMAL LOW (ref 4.22–5.81)
RDW: 15.3 % (ref 11.5–15.5)
RDW: 15.4 % (ref 11.5–15.5)
WBC: 10.2 10*3/uL (ref 4.0–10.5)
WBC: 10.9 10*3/uL — ABNORMAL HIGH (ref 4.0–10.5)
nRBC: 0 % (ref 0.0–0.2)
nRBC: 0 % (ref 0.0–0.2)

## 2019-04-25 LAB — BPAM RBC
Blood Product Expiration Date: 202007142359
ISSUE DATE / TIME: 202006111755
Unit Type and Rh: 5100

## 2019-04-25 LAB — HEMOGLOBIN A1C
Hgb A1c MFr Bld: 6.5 % — ABNORMAL HIGH (ref 4.8–5.6)
Mean Plasma Glucose: 139.85 mg/dL

## 2019-04-25 SURGERY — ARTERIOVENOUS (AV) FISTULA CREATION
Anesthesia: Monitor Anesthesia Care | Laterality: Left

## 2019-04-25 MED ORDER — SODIUM CHLORIDE 0.9 % IV SOLN
INTRAVENOUS | Status: AC
  Filled 2019-04-25: qty 1.2

## 2019-04-25 MED ORDER — PENTAFLUOROPROP-TETRAFLUOROETH EX AERO
1.0000 "application " | INHALATION_SPRAY | CUTANEOUS | Status: DC | PRN
  Filled 2019-04-25: qty 30

## 2019-04-25 MED ORDER — VANCOMYCIN HCL IN DEXTROSE 1-5 GM/200ML-% IV SOLN
1000.0000 mg | INTRAVENOUS | Status: AC
  Administered 2019-04-25: 1000 mg via INTRAVENOUS

## 2019-04-25 MED ORDER — FENTANYL CITRATE (PF) 250 MCG/5ML IJ SOLN
INTRAMUSCULAR | Status: AC
  Filled 2019-04-25: qty 5

## 2019-04-25 MED ORDER — ONDANSETRON HCL 4 MG/2ML IJ SOLN: 4.0000 mg | Freq: Once | INTRAMUSCULAR | Status: DC | PRN

## 2019-04-25 MED ORDER — LIDOCAINE-EPINEPHRINE (PF) 1 %-1:200000 IJ SOLN
INTRAMUSCULAR | Status: DC | PRN
  Administered 2019-04-25: 30 mL

## 2019-04-25 MED ORDER — PROTAMINE SULFATE 10 MG/ML IV SOLN
INTRAVENOUS | Status: DC | PRN
  Administered 2019-04-25 (×2): 10 mg via INTRAVENOUS
  Administered 2019-04-25: 20 mg via INTRAVENOUS

## 2019-04-25 MED ORDER — SODIUM CHLORIDE 0.9 % IV SOLN
INTRAVENOUS | Status: DC | PRN
  Administered 2019-04-25: 25 ug/min via INTRAVENOUS

## 2019-04-25 MED ORDER — LIDOCAINE-EPINEPHRINE (PF) 1 %-1:200000 IJ SOLN
INTRAMUSCULAR | Status: AC
  Filled 2019-04-25: qty 30

## 2019-04-25 MED ORDER — SODIUM CHLORIDE 0.9% FLUSH: 10.0000 mL | INTRAVENOUS | Status: DC | PRN

## 2019-04-25 MED ORDER — PAPAVERINE HCL 30 MG/ML IJ SOLN
INTRAMUSCULAR | Status: AC
  Filled 2019-04-25: qty 2

## 2019-04-25 MED ORDER — LIDOCAINE HCL (PF) 1 % IJ SOLN: 5.0000 mL | INTRAMUSCULAR | Status: DC | PRN

## 2019-04-25 MED ORDER — OXYCODONE HCL 5 MG/5ML PO SOLN: 5.0000 mg | Freq: Once | ORAL | Status: DC | PRN

## 2019-04-25 MED ORDER — SODIUM CHLORIDE 0.9 % IV SOLN
INTRAVENOUS | Status: DC
  Administered 2019-04-25 (×3): via INTRAVENOUS

## 2019-04-25 MED ORDER — 0.9 % SODIUM CHLORIDE (POUR BTL) OPTIME
TOPICAL | Status: DC | PRN
  Administered 2019-04-25: 1000 mL

## 2019-04-25 MED ORDER — PROPOFOL 10 MG/ML IV BOLUS
INTRAVENOUS | Status: DC | PRN
  Administered 2019-04-25 (×2): 20 mg via INTRAVENOUS

## 2019-04-25 MED ORDER — FENTANYL CITRATE (PF) 100 MCG/2ML IJ SOLN: 25.0000 ug | INTRAMUSCULAR | Status: DC | PRN

## 2019-04-25 MED ORDER — LIDOCAINE 2% (20 MG/ML) 5 ML SYRINGE
INTRAMUSCULAR | Status: DC | PRN
  Administered 2019-04-25: 40 mg via INTRAVENOUS

## 2019-04-25 MED ORDER — LIDOCAINE HCL (PF) 1 % IJ SOLN
INTRAMUSCULAR | Status: AC
  Filled 2019-04-25: qty 30

## 2019-04-25 MED ORDER — FENTANYL CITRATE (PF) 250 MCG/5ML IJ SOLN
INTRAMUSCULAR | Status: DC | PRN
  Administered 2019-04-25: 50 ug via INTRAVENOUS

## 2019-04-25 MED ORDER — PAPAVERINE HCL 30 MG/ML IJ SOLN
INTRAMUSCULAR | Status: DC | PRN
  Administered 2019-04-25: 60 mg via INTRAVENOUS

## 2019-04-25 MED ORDER — OXYCODONE HCL 5 MG PO TABS: 5.0000 mg | ORAL_TABLET | Freq: Once | ORAL | Status: DC | PRN

## 2019-04-25 MED ORDER — LIDOCAINE-PRILOCAINE 2.5-2.5 % EX CREA: 1.0000 "application " | TOPICAL_CREAM | CUTANEOUS | Status: DC | PRN

## 2019-04-25 MED ORDER — SODIUM CHLORIDE 0.9 % IV SOLN: 100.0000 mL | INTRAVENOUS | Status: DC | PRN

## 2019-04-25 MED ORDER — SODIUM CHLORIDE 0.9 % IV SOLN
INTRAVENOUS | Status: DC | PRN
  Administered 2019-04-25: 500 mL

## 2019-04-25 MED ORDER — PROPOFOL 500 MG/50ML IV EMUL
INTRAVENOUS | Status: DC | PRN
  Administered 2019-04-25: 75 ug/kg/min via INTRAVENOUS

## 2019-04-25 MED ORDER — PROPOFOL 10 MG/ML IV BOLUS
INTRAVENOUS | Status: AC
  Filled 2019-04-25: qty 20

## 2019-04-25 MED ORDER — ONDANSETRON HCL 4 MG/2ML IJ SOLN
INTRAMUSCULAR | Status: DC | PRN
  Administered 2019-04-25: 4 mg via INTRAVENOUS

## 2019-04-25 MED ORDER — HEPARIN SODIUM (PORCINE) 1000 UNIT/ML IJ SOLN
INTRAMUSCULAR | Status: DC | PRN
  Administered 2019-04-25: 8000 [IU] via INTRAVENOUS

## 2019-04-25 MED ORDER — ALTEPLASE 2 MG IJ SOLR: 2.0000 mg | Freq: Once | INTRAMUSCULAR | Status: DC | PRN

## 2019-04-25 SURGICAL SUPPLY — 39 items
ADH SKN CLS APL DERMABOND .7 (GAUZE/BANDAGES/DRESSINGS) ×1
ARMBAND PINK RESTRICT EXTREMIT (MISCELLANEOUS) ×6 IMPLANT
CANISTER SUCT 3000ML PPV (MISCELLANEOUS) ×3 IMPLANT
CANNULA VESSEL 3MM 2 BLNT TIP (CANNULA) ×3 IMPLANT
CLIP VESOCCLUDE MED 6/CT (CLIP) ×3 IMPLANT
CLIP VESOCCLUDE SM WIDE 6/CT (CLIP) ×5 IMPLANT
COVER PROBE W GEL 5X96 (DRAPES) IMPLANT
COVER WAND RF STERILE (DRAPES) ×3 IMPLANT
DECANTER SPIKE VIAL GLASS SM (MISCELLANEOUS) ×3 IMPLANT
DERMABOND ADVANCED (GAUZE/BANDAGES/DRESSINGS) ×2
DERMABOND ADVANCED .7 DNX12 (GAUZE/BANDAGES/DRESSINGS) ×1 IMPLANT
ELECT REM PT RETURN 9FT ADLT (ELECTROSURGICAL) ×3
ELECTRODE REM PT RTRN 9FT ADLT (ELECTROSURGICAL) ×1 IMPLANT
GLOVE BIO SURGEON STRL SZ7.5 (GLOVE) ×3 IMPLANT
GLOVE BIOGEL PI IND STRL 6.5 (GLOVE) IMPLANT
GLOVE BIOGEL PI IND STRL 8 (GLOVE) ×1 IMPLANT
GLOVE BIOGEL PI INDICATOR 6.5 (GLOVE) ×2
GLOVE BIOGEL PI INDICATOR 8 (GLOVE) ×2
GLOVE ECLIPSE 7.0 STRL STRAW (GLOVE) ×2 IMPLANT
GLOVE INDICATOR 7.5 STRL GRN (GLOVE) ×2 IMPLANT
GOWN STRL REUS W/ TWL LRG LVL3 (GOWN DISPOSABLE) ×3 IMPLANT
GOWN STRL REUS W/TWL LRG LVL3 (GOWN DISPOSABLE) ×9
KIT BASIN OR (CUSTOM PROCEDURE TRAY) ×3 IMPLANT
KIT TURNOVER KIT B (KITS) ×3 IMPLANT
NDL 18GX1X1/2 (RX/OR ONLY) (NEEDLE) IMPLANT
NEEDLE 18GX1X1/2 (RX/OR ONLY) (NEEDLE) ×3 IMPLANT
NS IRRIG 1000ML POUR BTL (IV SOLUTION) ×3 IMPLANT
PACK CV ACCESS (CUSTOM PROCEDURE TRAY) ×3 IMPLANT
PAD ARMBOARD 7.5X6 YLW CONV (MISCELLANEOUS) ×6 IMPLANT
SPONGE SURGIFOAM ABS GEL 100 (HEMOSTASIS) IMPLANT
SUT PROLENE 6 0 BV (SUTURE) ×5 IMPLANT
SUT VIC AB 3-0 SH 27 (SUTURE) ×3
SUT VIC AB 3-0 SH 27X BRD (SUTURE) ×1 IMPLANT
SUT VICRYL 4-0 PS2 18IN ABS (SUTURE) ×3 IMPLANT
SYR 20CC LL (SYRINGE) ×2 IMPLANT
SYR 3ML LL SCALE MARK (SYRINGE) ×2 IMPLANT
TOWEL GREEN STERILE (TOWEL DISPOSABLE) ×3 IMPLANT
UNDERPAD 30X30 (UNDERPADS AND DIAPERS) ×3 IMPLANT
WATER STERILE IRR 1000ML POUR (IV SOLUTION) ×3 IMPLANT

## 2019-04-25 NOTE — Anesthesia Preprocedure Evaluation (Addendum)
Anesthesia Evaluation  Patient identified by MRN, date of birth, ID band Patient awake    Reviewed: Allergy & Precautions, NPO status , Patient's Chart, lab work & pertinent test results  Airway Mallampati: II  TM Distance: >3 FB Neck ROM: Full    Dental  (+) Teeth Intact, Dental Advisory Given   Pulmonary former smoker,    breath sounds clear to auscultation       Cardiovascular hypertension,  Rhythm:Regular Rate:Normal     Neuro/Psych    GI/Hepatic   Endo/Other    Renal/GU      Musculoskeletal   Abdominal   Peds  Hematology   Anesthesia Other Findings   Reproductive/Obstetrics                            Anesthesia Physical Anesthesia Plan  ASA: III  Anesthesia Plan: MAC   Post-op Pain Management:    Induction: Intravenous  PONV Risk Score and Plan:   Airway Management Planned: Natural Airway and Simple Face Mask  Additional Equipment:   Intra-op Plan:   Post-operative Plan:   Informed Consent: I have reviewed the patients History and Physical, chart, labs and discussed the procedure including the risks, benefits and alternatives for the proposed anesthesia with the patient or authorized representative who has indicated his/her understanding and acceptance.     Dental advisory given  Plan Discussed with: CRNA and Anesthesiologist  Anesthesia Plan Comments:        Anesthesia Quick Evaluation

## 2019-04-25 NOTE — Anesthesia Postprocedure Evaluation (Signed)
Anesthesia Post Note  Patient: Jeremy Johnson  Procedure(s) Performed: ARTERIOVENOUS (AV) FISTULA CREATION LEFT ARM (Left )     Patient location during evaluation: PACU Anesthesia Type: MAC Level of consciousness: awake and alert Pain management: pain level controlled Vital Signs Assessment: post-procedure vital signs reviewed and stable Respiratory status: spontaneous breathing, nonlabored ventilation, respiratory function stable and patient connected to nasal cannula oxygen Cardiovascular status: stable and blood pressure returned to baseline Postop Assessment: no apparent nausea or vomiting Anesthetic complications: no    Last Vitals:  Vitals:   04/25/19 1642 04/25/19 1702  BP: (!) 147/79 (!) 145/90  Pulse: 76 77  Resp: 15 18  Temp: (!) 36.3 C 36.8 C  SpO2: 93% 92%    Last Pain:  Vitals:   04/25/19 1702  TempSrc: Oral  PainSc:                  Stephone Gum COKER

## 2019-04-25 NOTE — Progress Notes (Signed)
Renal Navigator discussed patient with Nephrologist/Dr. Augustin Coupe and notified OP HD clinic/[facility] that patient will start in clinic on Tuesday, 04/29/19 if discharged over the weekend. Patient's wife aware of this plan. Patient cleared for discharge from OP HD standpoint for start in clinic on 04/29/19. If patient remains in hospital on Monday, Renal Navigator will continue to follow.  Alphonzo Cruise, [place] Renal Navigator [phone]

## 2019-04-25 NOTE — Progress Notes (Signed)
Brief Nutrition Note  RD planned to see pt in person to provide ESRD on HD diet education today. However, pt was unavailable in the OR on all attempts.  Multiple nutrition education materials left in pt's room. Pt will receive extensive diet education from the RD at his outpatient HD center.   Gaynell Face, MS, RD, LDN Inpatient Clinical Dietitian Pager: [phone] Weekend/After Hours: [phone]

## 2019-04-25 NOTE — Op Note (Signed)
    NAME: Jeremy Johnson    MRN: [id number] DOB: 01-17-1946    DATE OF OPERATION: 04/25/2019  PREOP DIAGNOSIS:    End-stage renal disease  POSTOP DIAGNOSIS:    Same  PROCEDURE:    Left brachiocephalic AV fistula  SURGEON: Judeth Cornfield. Scot Dock, MD, FACS  ASSIST: Laurence Slate, PA  ANESTHESIA: Local with sedation  EBL: Minimal  INDICATIONS:    [name] is a 73 y.o. male who presents for new access  FINDINGS:   3 mm upper arm cephalic vein  TECHNIQUE:   The patient was taken to the operating room and sedated by anesthesia.  The left upper extremity was prepped and draped in the usual sterile fashion.  A transverse incision was made just above the antecubital level after the skin was anesthetized.  The cephalic vein was dissected free and mobilized and ligated distally.  It was a 3 mm vein.  The brachial artery was dissected free of any the fascia.  The patient was heparinized.  Of note the artery was medial and the vein quite lateral.  I mobilized enough vein to make that reach.  Brachial artery was clamped proximally and distally and a longitudinal arteriotomy was made.  The vein was slightly spatulated and sewn into side to the artery using continuous 6-0 Prolene suture.  At the completion there was an excellent thrill in the fistula and a palpable radial pulse.  Hemostasis was obtained in the wound.  The wound was closed with a deep layer 3-0 Vicryl the skin closed with 4-0 Vicryl.  Dermabond was applied.  Patient tolerated the procedure well was transferred to the recovery room in stable condition.  All needle and sponge counts were correct.  [name], MD, FACS [facility]  DATE OF DICTATION:   04/25/2019

## 2019-04-25 NOTE — Plan of Care (Signed)
Pt gets up and walks a round his room frequently. Makes frequent moves from the bed to his chair also.

## 2019-04-25 NOTE — Anesthesia Procedure Notes (Signed)
Procedure Name: MAC Date/Time: 04/25/2019 3:00 PM Performed by: Jearld Pies, CRNA Pre-anesthesia Checklist: Patient being monitored, Suction available, Emergency Drugs available and Patient identified Patient Re-evaluated:Patient Re-evaluated prior to induction Oxygen Delivery Method: Simple face mask Preoxygenation: Pre-oxygenation with 100% oxygen

## 2019-04-25 NOTE — Progress Notes (Signed)
Pt arrived to the unit from PACU. He is alert & oriented x4, denies any pain, no distress noted. Bed alarm on and call light within reach.    Paulla Fore, RN.

## 2019-04-25 NOTE — Progress Notes (Signed)
Started Vancomycin 1gm per order. Epic not allowing pharmacy to program order in [facility].

## 2019-04-25 NOTE — Progress Notes (Signed)
PROGRESS NOTE  Jeremy Johnson TIR:[id number] DOB: 08-29-1946 DOA: 04/14/2019 PCP: [name], MD   LOS: 11 days   Patient is from: Home  Brief Narrative / Interim history: 73 year old with history of limited stage small cell lung cancer, anemia of chronic disease, CKD-3, essential HTN hypothyroidism, HLD and CAD who was sent to the hospital by oncologist for evaluation of AKI with creatinine to 9.5 and anemia with Hgb to 6.9. Patient was transferred to [facility] for possible renal biopsy and initiation of dialysis.  Renal function continued to worsen. Patient underwent renal biopsy on 04/21/2019.  HD cath placed on 6/9 hemodialysis started on 6/10.  Subjective: No major events overnight of this morning.  No complaint this morning.  Denies chest pain, dyspnea, GI or GU symptoms.  Continues to make fair amount of urine.  On board with the plan for vascular procedure today. CLIPPED for outpatient HD.   Assessment & Plan: Acute on CKD-3/azotemia: Azotemia resolving with HD. 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 Cr 2.79 & 3.37 on 5/11 & 5/18 respectively.  -ATN from carboplatin, losartan/HCTZ and anemia could contribute. -Serologies not impressive.  No schistocytes on smear.  Haptoglobin high.   -Appreciate nephrology guidance -Not pursuing tx for anti-GBM after risk-benefit discussion with renal -HD started on 6/10.   -Plan for permanent access by VVS on 6/12 -CLIPPED -Continue monitoring renal function -Avoid nephrotoxic meds -Needs hepatitis B vaccine  Anion gap metabolic acidosis: Likely due to renal failure/azotemia.  Resolved.  Leukocytosis: there is some element of hemoconcentration.  No apparent source of infection.  Resolved.  IDA/AICD likely due to chemo and renal failure:  -Anemia panel suggestive IDA/ACD combined -Hgb 6.9 (admit)--1 unit--8.5--7.4--7.0--1 unit--8.4.  -Aransep and p.o. iron per nephrology -Continue  monitoring -FOBT  Hematuria: ?due to GBM -Outpatient urology follow-up  Chronic CAD: No cardiopulmonary symptoms -Continue home metoprolol, aspirin and Zocor  Essential hypertension: Normotensive.  On amlodipine and metoprolol at home -Continue home metoprolol and amlodipine -Hold home losartan/HCTZ in the setting of AKI  Limited stage (T2b, N1, M0) small cell lung cancer: Diagnosed in 01/31/2019 -Recently started chemo with carboplatin, etoposide-received cycle 3 on 5/18. -Outpatient oncology follow-up  Thrombocytopenia: Resolved.  Hypokalemia and hypomagnesemia -Carefully replenish and recheck  Scheduled Meds: . [MAR Hold] amLODipine  5 mg Oral Daily  . [MAR Hold] calcitRIOL  0.25 mcg Oral Daily  . [MAR Hold] Chlorhexidine Gluconate Cloth  6 each Topical Q0600  . [MAR Hold] darbepoetin (ARANESP) injection - DIALYSIS  100 mcg Intravenous Q Thu-HD  . [MAR Hold] levothyroxine  175 mcg Oral QAC breakfast  . [MAR Hold] metoprolol tartrate  25 mg Oral BID  . [MAR Hold] pantoprazole  40 mg Oral Daily  . [MAR Hold] sevelamer carbonate  1,600 mg Oral TID WC  . [MAR Hold] simvastatin  10 mg Oral q1800  . [MAR Hold] sucralfate  1 g Oral TID WC & HS   Continuous Infusions: . sodium chloride 10 mL/hr at 04/25/19 0940  . [MAR Hold] ferric gluconate (FERRLECIT/NULECIT) IV 250 mg (04/24/19 1449)  . vancomycin 1,000 mg (04/25/19 0955)   PRN Meds:.[MAR Hold] acetaminophen, [MAR Hold] famotidine, [MAR Hold] heparin, [MAR Hold] hydrALAZINE, [MAR Hold] ondansetron (ZOFRAN) IV, [MAR Hold] polyethylene glycol, [MAR Hold] senna-docusate, [MAR Hold] traZODone   DVT prophylaxis: SCD Code Status: Full code Family Communication: patient to let me know if questions from family Disposition Plan: Remains inpatient for acute renal failure.  Starting HD  Consultants:   Nephrology  IR  VVS  Procedures:   Renal biopsy on 6/8  HD cath on 6/9  Microbiology: . COVID-19  negative  Antimicrobials: Anti-infectives (From admission, onward)   Start     Dose/Rate Route Frequency Ordered Stop   04/25/19 1200  vancomycin (VANCOCIN) IVPB 1000 mg/200 mL premix  Status:  Discontinued     1,000 mg 200 mL/hr over 60 Minutes Intravenous To Surgery 04/24/19 0801 04/25/19 1002   04/25/19 1000  vancomycin (VANCOCIN) IVPB 1000 mg/200 mL premix     1,000 mg 200 mL/hr over 60 Minutes Intravenous To Surgery 04/25/19 1002 04/26/19 1015   04/22/19 1253  vancomycin (VANCOCIN) 1-5 GM/200ML-% IVPB    Note to Pharmacy: Manuela Neptune   : cabinet override      04/22/19 1253 04/23/19 0059   04/22/19 0745  vancomycin (VANCOCIN) IVPB 1000 mg/200 mL premix     1,000 mg 200 mL/hr over 60 Minutes Intravenous To Radiology 04/22/19 0731 04/22/19 1409      Objective: Vitals:   04/25/19 0427 04/25/19 0440 04/25/19 0923 04/25/19 0938  BP:  (!) 149/92 (!) 156/89   Pulse:  85 92   Resp:  20 18   Temp:  98.3 F (36.8 C) 98 F (36.7 C)   TempSrc:  Oral Oral   SpO2:  96% 99%   Weight: 87.6 kg   87.6 kg  Height:    5\' 9"  (1.753 m)    Intake/Output Summary (Last 24 hours) at 04/25/2019 1038 Last data filed at 04/25/2019 0601 Gross per 24 hour  Intake 229.78 ml  Output 2800 ml  Net -2570.22 ml   Filed Weights   04/24/19 2114 04/25/19 0427 04/25/19 0938  Weight: 87.6 kg 87.6 kg 87.6 kg    Examination:  GENERAL: No acute distress.  Appears well.  HEENT: MMM.  Vision and hearing grossly intact.  NECK: Supple.  No JVD.  LUNGS:  No IWOB. Good air movement bilaterally. HEART:  RRR. Heart sounds normal.  ABD: Bowel sounds present. Soft. Non tender.  MSK/EXT:  Moves all extremities. No apparent deformity. No edema bilaterally.  SKIN: no apparent skin lesion or wound NEURO: Awake, alert and oriented appropriately.  No gross deficit.  PSYCH: Calm. Normal affect.  Data Reviewed: I have independently reviewed following labs and imaging studies  CBC: Recent Labs  Lab  04/20/19 0449 04/21/19 0541 04/22/19 0612 04/23/19 0934 04/24/19 1311 04/25/19 0512  WBC 12.1* 12.1* 15.5* 9.8 10.3 10.2  NEUTROABS 8.6*  --   --   --   --   --   HGB 7.4* 7.3* 8.5* 7.4* 7.0* 8.4*  HCT 21.4* 21.1* 25.0* 20.9* 20.5* 24.3*  MCV 87.3 87.6 88.3 86.4 89.9 87.1  PLT 133* 165 234 211 233 643   Basic Metabolic Panel: Recent Labs  Lab 04/20/19 0449 04/21/19 0541 04/22/19 0612 04/23/19 0934 04/24/19 0631 04/24/19 1648 04/25/19 0512  NA 137 135 134* 136 134*  --  135  K 3.6 3.9 3.3* 3.1* 3.5  --  3.4*  CL 102 102 96* 100 100  --  99  CO2 19* 19* 18* 21* 20*  --  24  GLUCOSE 101* 94 103* 154* 101*  --  102*  BUN 74* 79* 87* 51* 57*  --  26*  CREATININE 13.78* 14.89* 15.95* 11.18* 12.54*  --  7.72*  CALCIUM 7.8* 7.8* 8.2* 7.6* 7.7*  --  7.9*  MG 1.7 1.7 1.9 1.6*  --  2.1  --  PHOS 7.2* 8.5* 8.7* 4.3 5.2*  --  3.7   GFR: Estimated Creatinine Clearance: 9.5 mL/min (A) (by C-G formula based on SCr of 7.72 mg/dL (H)). Liver Function Tests: Recent Labs  Lab 04/19/19 0303 04/20/19 0449 04/21/19 0541 04/22/19 0612 04/23/19 0934 04/24/19 0631 04/25/19 0512  AST 16 20 21   --  25  --   --   ALT 13 17 18   --  25  --   --   ALKPHOS 70 74 76  --  74  --   --   BILITOT 0.5 0.3 0.4  --  0.6  --   --   PROT 4.7* 4.9* 5.0*  --  5.7*  --   --   ALBUMIN 1.8* 1.8* 1.7* 2.0* 1.7* 1.7* 1.9*   No results for input(s): LIPASE, AMYLASE in the last 168 hours. No results for input(s): AMMONIA in the last 168 hours. Coagulation Profile: Recent Labs  Lab 04/25/19 0512  INR 1.1   Cardiac Enzymes: Recent Labs  Lab 04/23/19 0934  CKTOTAL 72   BNP (last 3 results) No results for input(s): PROBNP in the last 8760 hours. HbA1C: Recent Labs    04/25/19 0512  HGBA1C 6.5*   CBG: No results for input(s): GLUCAP in the last 168 hours. Lipid Profile: Recent Labs    04/22/19 1518  CHOL 136  HDL 25*  LDLCALC 77  TRIG 169*  CHOLHDL 5.4   Thyroid Function Tests: No  results for input(s): TSH, T4TOTAL, FREET4, T3FREE, THYROIDAB in the last 72 hours. Anemia Panel: Recent Labs    04/22/19 1518  VITAMINB12 1,007*  FOLATE 11.1  FERRITIN 1,038*  TIBC 141*  IRON 18*  RETICCTPCT 1.5   Urine analysis:    Component Value Date/Time   COLORURINE STRAW (A) 04/15/2019 0913   APPEARANCEUR CLEAR 04/15/2019 0913   LABSPEC 1.010 04/15/2019 0913   PHURINE 7.0 04/15/2019 0913   GLUCOSEU 50 (A) 04/15/2019 0913   HGBUR MODERATE (A) 04/15/2019 0913   BILIRUBINUR NEGATIVE 04/15/2019 0913   KETONESUR NEGATIVE 04/15/2019 0913   PROTEINUR >=300 (A) 04/15/2019 0913   NITRITE NEGATIVE 04/15/2019 0913   LEUKOCYTESUR NEGATIVE 04/15/2019 0913   Sepsis Labs: Invalid input(s): PROCALCITONIN, LACTICIDVEN  No results found for this or any previous visit (from the past 240 hour(s)).    Radiology Studies: Vas [place] Upper Ext Vein Mapping (pre-op Avf)  Result Date: 04/24/2019 UPPER EXTREMITY VEIN MAPPING  Indications: Pre-access. Comparison Study: No prior studies.  Examination Guidelines: A complete evaluation includes B-mode imaging, spectral Doppler, color Doppler, and power Doppler as needed of all accessible portions of each vessel. Bilateral testing is considered an integral part of a complete examination. Limited examinations for reoccurring indications may be performed as noted. +-----------------+-------------+----------+---------+ Right Cephalic   Diameter (cm)Depth (cm)Findings  +-----------------+-------------+----------+---------+ Shoulder             0.48        0.83             +-----------------+-------------+----------+---------+ Prox upper arm       0.40        0.93             +-----------------+-------------+----------+---------+ Mid upper arm        0.38        0.39             +-----------------+-------------+----------+---------+ Dist upper arm       0.32        0.38             +-----------------+-------------+----------+---------+  Antecubital fossa    0.33        0.58   branching +-----------------+-------------+----------+---------+ Prox forearm         0.27        0.52             +-----------------+-------------+----------+---------+ Mid forearm          0.22        0.48             +-----------------+-------------+----------+---------+ Dist forearm         0.25        0.31             +-----------------+-------------+----------+---------+ +-----------------+-------------+----------+---------+ Left Cephalic    Diameter (cm)Depth (cm)Findings  +-----------------+-------------+----------+---------+ Shoulder             0.42        1.08             +-----------------+-------------+----------+---------+ Prox upper arm       0.35        1.14             +-----------------+-------------+----------+---------+ Mid upper arm        0.32        0.40   branching +-----------------+-------------+----------+---------+ Dist upper arm       0.44        0.38             +-----------------+-------------+----------+---------+ Antecubital fossa    0.43        0.40   branching +-----------------+-------------+----------+---------+ Prox forearm         0.38        0.48   branching +-----------------+-------------+----------+---------+ Mid forearm          0.29        0.45             +-----------------+-------------+----------+---------+ Dist forearm         0.22        0.27             +-----------------+-------------+----------+---------+ *See table(s) above for measurements and observations.  Diagnosing physician: Servando Snare MD Electronically signed by Servando Snare MD on 04/24/2019 at 5:32:07 PM.    Final       [facility] Pager [phone]  If 7PM-7AM, please contact night-coverage [email or web address] Password [facility] 04/25/2019, 10:38 AM

## 2019-04-25 NOTE — Interval H&P Note (Signed)
History and Physical Interval Note:  04/25/2019 2:24 PM  Jeremy Johnson  has presented today for surgery, with the diagnosis of END STAGE RENAL DISEASE FOR HEMODIALYSIS ACCESS.  The various methods of treatment have been discussed with the patient and family. After consideration of risks, benefits and other options for treatment, the patient has consented to  Procedure(s): ARTERIOVENOUS (AV) FISTULA CREATION VERSUS INSERTION OF ARTERIOVENOUS GRAFT LEFT ARM (Left) as a surgical intervention.  The patient's history has been reviewed, patient examined, no change in status, stable for surgery.  I have reviewed the patient's chart and labs.  Questions were answered to the patient's satisfaction.     [name]

## 2019-04-25 NOTE — Telephone Encounter (Signed)
I talk with patients wife regarding time change on 6/30

## 2019-04-25 NOTE — Transfer of Care (Signed)
Immediate Anesthesia Transfer of Care Note  Patient: Jeremy Johnson  Procedure(s) Performed: ARTERIOVENOUS (AV) FISTULA CREATION LEFT ARM (Left )  Patient Location: PACU  Anesthesia Type:MAC  Level of Consciousness: awake, alert  and oriented  Airway & Oxygen Therapy: Patient Spontanous Breathing and Patient connected to face mask oxygen  Post-op Assessment: Report given to RN and Post -op Vital signs reviewed and stable  Post vital signs: Reviewed and stable  Last Vitals:  Vitals Value Taken Time  BP 132/80   Temp    Pulse 74 04/25/19 1627  Resp 22 04/25/19 1627  SpO2 98 % 04/25/19 1627  Vitals shown include unvalidated device data.  Last Pain:  Vitals:   04/25/19 0923  TempSrc: Oral  PainSc:          Complications: No apparent anesthesia complications

## 2019-04-26 ENCOUNTER — Encounter (HOSPITAL_COMMUNITY): Payer: Self-pay | Admitting: Vascular Surgery

## 2019-04-26 DIAGNOSIS — Z23 Encounter for immunization: Secondary | ICD-10-CM | POA: Insufficient documentation

## 2019-04-26 DIAGNOSIS — R0602 Shortness of breath: Secondary | ICD-10-CM | POA: Insufficient documentation

## 2019-04-26 DIAGNOSIS — R52 Pain, unspecified: Secondary | ICD-10-CM | POA: Insufficient documentation

## 2019-04-26 DIAGNOSIS — R197 Diarrhea, unspecified: Secondary | ICD-10-CM | POA: Insufficient documentation

## 2019-04-26 DIAGNOSIS — N2581 Secondary hyperparathyroidism of renal origin: Secondary | ICD-10-CM | POA: Insufficient documentation

## 2019-04-26 DIAGNOSIS — M1A9XX0 Chronic gout, unspecified, without tophus (tophi): Secondary | ICD-10-CM

## 2019-04-26 DIAGNOSIS — R0600 Dyspnea, unspecified: Secondary | ICD-10-CM | POA: Insufficient documentation

## 2019-04-26 DIAGNOSIS — D689 Coagulation defect, unspecified: Secondary | ICD-10-CM | POA: Insufficient documentation

## 2019-04-26 DIAGNOSIS — L299 Pruritus, unspecified: Secondary | ICD-10-CM | POA: Insufficient documentation

## 2019-04-26 DIAGNOSIS — E876 Hypokalemia: Secondary | ICD-10-CM | POA: Insufficient documentation

## 2019-04-26 LAB — RENAL FUNCTION PANEL
Albumin: 1.9 g/dL — ABNORMAL LOW (ref 3.5–5.0)
Albumin: 1.7 g/dL — ABNORMAL LOW (ref 3.5–5.0)
Anion gap: 12 (ref 5–15)
Anion gap: 13 (ref 5–15)
BUN: 26 mg/dL — ABNORMAL HIGH (ref 8–23)
BUN: 35 mg/dL — ABNORMAL HIGH (ref 8–23)
CO2: 24 mmol/L (ref 22–32)
CO2: 23 mmol/L (ref 22–32)
Calcium: 7.9 mg/dL — ABNORMAL LOW (ref 8.9–10.3)
Calcium: 7.8 mg/dL — ABNORMAL LOW (ref 8.9–10.3)
Chloride: 99 mmol/L (ref 98–111)
Chloride: 97 mmol/L — ABNORMAL LOW (ref 98–111)
Creatinine, Ser: 7.72 mg/dL — ABNORMAL HIGH (ref 0.61–1.24)
Creatinine, Ser: 9.6 mg/dL — ABNORMAL HIGH (ref 0.61–1.24)
GFR calc Af Amer: 7 mL/min — ABNORMAL LOW
GFR calc Af Amer: 6 mL/min — ABNORMAL LOW (ref >60)
GFR calc non Af Amer: 6 mL/min — ABNORMAL LOW
GFR calc non Af Amer: 5 mL/min — ABNORMAL LOW (ref >60)
Glucose, Bld: 102 mg/dL — ABNORMAL HIGH (ref 70–99)
Glucose, Bld: 96 mg/dL (ref 70–99)
Phosphorus: 3.7 mg/dL (ref 2.5–4.6)
Phosphorus: 5 mg/dL — ABNORMAL HIGH (ref 2.5–4.6)
Potassium: 3.4 mmol/L — ABNORMAL LOW (ref 3.5–5.1)
Potassium: 3.5 mmol/L (ref 3.5–5.1)
Sodium: 135 mmol/L (ref 135–145)
Sodium: 133 mmol/L — ABNORMAL LOW (ref 135–145)

## 2019-04-26 LAB — CBC
HCT: 24.7 % — ABNORMAL LOW (ref 39.0–52.0)
Hemoglobin: 8.2 g/dL — ABNORMAL LOW (ref 13.0–17.0)
MCH: 30.1 pg (ref 26.0–34.0)
MCHC: 33.2 g/dL (ref 30.0–36.0)
MCV: 90.8 fL (ref 80.0–100.0)
Platelets: 246 10*3/uL (ref 150–400)
RBC: 2.72 MIL/uL — ABNORMAL LOW (ref 4.22–5.81)
RDW: 15.5 % (ref 11.5–15.5)
WBC: 10.5 10*3/uL (ref 4.0–10.5)
nRBC: 0 % (ref 0.0–0.2)

## 2019-04-26 MED ORDER — ALLOPURINOL 100 MG PO TABS: 100.0000 mg | ORAL_TABLET | Freq: Every day | ORAL | 1 refills | Status: DC

## 2019-04-26 MED ORDER — DARBEPOETIN ALFA 100 MCG/0.5ML IJ SOSY: 100.0000 ug | PREFILLED_SYRINGE | INTRAMUSCULAR | Status: DC

## 2019-04-26 MED ORDER — HEPARIN SODIUM (PORCINE) 1000 UNIT/ML IJ SOLN
INTRAMUSCULAR | Status: AC
  Administered 2019-04-26: 1000 [IU] via INTRAVENOUS
  Filled 2019-04-26: qty 4

## 2019-04-26 MED ORDER — SEVELAMER CARBONATE 800 MG PO TABS: 1600.0000 mg | ORAL_TABLET | Freq: Three times a day (TID) | ORAL | 2 refills | Status: DC

## 2019-04-26 NOTE — Progress Notes (Signed)
DISCHARGE NOTE Jeremy Johnson to be discharged Home per MD order. Patient verbalized understanding.  Skin clean, dry and intact without evidence of skin break down, no evidence of skin tears noted. IV catheter discontinued intact. Site without signs and symptoms of complications. Dressing and pressure applied. Pt denies pain at the site currently. No complaints noted.  Patient free of lines, drains, and wounds.   Discharge packet assembled. An After Visit Summary (AVS) was printed and given to the patient. Patient escorted via wheelchair and discharged to home  via private auto.  Babs Sciara, RN

## 2019-04-26 NOTE — Progress Notes (Signed)
[facility] Progress Note    Assessment/ Plan:   73 y.o.yo malewith HTN, HLD, AAA s/p repair in 2006causing damage to the kidney and then CKD with baseline creatinine around2.5per patient, recently dx with SCC of lung s/p 3 cycles of carboplatin and etoposide and RT, last chemo on 5/18, sent to the hospital for abnormal labs including hemoglobin 6.9 and serum creatinine level 9.86.  #Acute kidney injury on CKD- on 5/11 crt was 2.79, 5/18 3.37 then just worse from there. possiblyATN due to carboplatin, losartan/HCTZand anemia: Other differential could be glomerulonephritis due to Neulasta vs membranous GN due to cancer vs rare possibility of chemo induced TMA. Urinalysis with proteinuria- over 11 grams and hematuria. -Patient is asymptomatic and had good urine output despite of worsening serum creatinine level to 13 today. Rate of rise is less?No absoluteHD indications but starting to get to an uncomfortable range  serologies- c3, c4 WNL, ANCA neg , ANA negative , antiGBM ab negative , hep B, C, HIV all negative , K/l ratio is 1.45. - Haptoglobin high , INR and LDH slightly high. No mention of schistocytes in CBC differential.  -US renal with increased echogenicity consistent with CKD.  - Appreciate Dr. Annamaria Boots performing the kidney biopsy for the definitive diagnosis.No bleeding noted overnight. -Biopsy shows anti-GBM w/ 100% crescents; chances of recovery with aggressive tx (PLEX, high dose steroids, CTX) is very low. D/w pt and risk of sepsis far outweights low potential for recovery.    AppreciateVIR Placing RIJtunneled and Dr. Scot Dock placing the lt BCF on 6/12.  Seen just after HD #3; filter clotted last 15-20 min and did not restart.   CLIP'd for [facility] TTS  #Hyperphosphatemia- change to renvela 2400mg  TIDM, will also check a CK.Phos is better now. Sat phos is 5.  #Hypertension: Monitor BP.Continue metoprolol. Seems fine.    #Anemia  due to chemotherapy: Received 2 units of PRBC transfusion on 04/14/2019. Monitor CBC. Drifting down again. Nulecit 250 given on 6/11 and 6/13, Aranesp 100 given 6/11 . #Thrombocytopenia: Due to chemo. No sign of bleeding. Inc nicely   #Small cell lung cancer: On chemotherapy. Per oncology- spoke w/ Dr. Julien Nordmann and he will need 1 more cycle (would be the 4th) + XRT.  Subjective:   Denies f/c/v but is Intermittently nauseous but improving  Denies dyspnea. No blood in urine overnight.  He is ready to go home.   Objective:   BP (!) 157/83   Pulse 80   Temp 97.9 F (36.6 C) (Oral)   Resp 18   Ht 5\' 9"  (1.753 m)   Wt 87.9 kg   SpO2 94%   BMI 28.62 kg/m   Intake/Output Summary (Last 24 hours) at 04/26/2019 1054 Last data filed at 04/26/2019 0601 Gross per 24 hour  Intake 620 ml  Output 925 ml  Net -305 ml   Weight change: -1.9 kg  Physical Exam: General: Not in distress, comfortable Heart:RRR, s1s2 nl no rubs Lungs: Bilateral, no wheezing or crackle Abdomen:soft, Non-tender, non-distended Extremities:traceedema Neurology: Alert, awake, nonfocal, no asterixis Skin:No rash or ulcer Access:RIJ TC, Lt BCF good bruit  Imaging: Vas [place] Upper Ext Vein Mapping (pre-op Avf)  Result Date: 04/24/2019 UPPER EXTREMITY VEIN MAPPING  Indications: Pre-access. Comparison Study: No prior studies.  Examination Guidelines: A complete evaluation includes B-mode imaging, spectral Doppler, color Doppler, and power Doppler as needed of all accessible portions of each vessel. Bilateral testing is considered an integral part of a complete examination. Limited examinations for reoccurring indications  may be performed as noted. +-----------------+-------------+----------+---------+ Right Cephalic   Diameter (cm)Depth (cm)Findings  +-----------------+-------------+----------+---------+ Shoulder             0.48        0.83              +-----------------+-------------+----------+---------+ Prox upper arm       0.40        0.93             +-----------------+-------------+----------+---------+ Mid upper arm        0.38        0.39             +-----------------+-------------+----------+---------+ Dist upper arm       0.32        0.38             +-----------------+-------------+----------+---------+ Antecubital fossa    0.33        0.58   branching +-----------------+-------------+----------+---------+ Prox forearm         0.27        0.52             +-----------------+-------------+----------+---------+ Mid forearm          0.22        0.48             +-----------------+-------------+----------+---------+ Dist forearm         0.25        0.31             +-----------------+-------------+----------+---------+ +-----------------+-------------+----------+---------+ Left Cephalic    Diameter (cm)Depth (cm)Findings  +-----------------+-------------+----------+---------+ Shoulder             0.42        1.08             +-----------------+-------------+----------+---------+ Prox upper arm       0.35        1.14             +-----------------+-------------+----------+---------+ Mid upper arm        0.32        0.40   branching +-----------------+-------------+----------+---------+ Dist upper arm       0.44        0.38             +-----------------+-------------+----------+---------+ Antecubital fossa    0.43        0.40   branching +-----------------+-------------+----------+---------+ Prox forearm         0.38        0.48   branching +-----------------+-------------+----------+---------+ Mid forearm          0.29        0.45             +-----------------+-------------+----------+---------+ Dist forearm         0.22        0.27             +-----------------+-------------+----------+---------+ *See table(s) above for measurements and observations.  Diagnosing physician:  Servando Snare MD Electronically signed by Servando Snare MD on 04/24/2019 at 5:32:07 PM.    Final     Labs: BMET Recent Labs  Lab 04/20/19 4034 04/21/19 7425 04/22/19 9563 04/23/19 8756 04/24/19 0631 04/25/19 0512 04/26/19 0721  NA 137 135 134* 136 134* 135 133*  K 3.6 3.9 3.3* 3.1* 3.5 3.4* 3.5  CL 102 102 96* 100 100 99 97*  CO2 19* 19* 18* 21* 20* 24 23  GLUCOSE 101* 94 103* 154* 101* 102* 96  BUN 74* 79* 87*  51* 57* 26* 35*  CREATININE 13.78* 14.89* 15.95* 11.18* 12.54* 7.72* 9.60*  CALCIUM 7.8* 7.8* 8.2* 7.6* 7.7* 7.9* 7.8*  PHOS 7.2* 8.5* 8.7* 4.3 5.2* 3.7 5.0*   CBC Recent Labs  Lab 04/20/19 0449  04/24/19 1311 04/25/19 0512 04/25/19 1834 04/26/19 0720  WBC 12.1*   < > 10.3 10.2 10.9* 10.5  NEUTROABS 8.6*  --   --   --   --   --   HGB 7.4*   < > 7.0* 8.4* 9.4* 8.2*  HCT 21.4*   < > 20.5* 24.3* 27.6* 24.7*  MCV 87.3   < > 89.9 87.1 88.7 90.8  PLT 133*   < > 233 252 267 246   < > = values in this interval not displayed.    Medications:    . amLODipine  5 mg Oral Daily  . calcitRIOL  0.25 mcg Oral Daily  . Chlorhexidine Gluconate Cloth  6 each Topical Q0600  . [START ON 05/01/2019] darbepoetin (ARANESP) injection - DIALYSIS  100 mcg Intravenous Q Thu-HD  . heparin      . levothyroxine  175 mcg Oral QAC breakfast  . metoprolol tartrate  25 mg Oral BID  . pantoprazole  40 mg Oral Daily  . sevelamer carbonate  1,600 mg Oral TID WC  . simvastatin  10 mg Oral q1800  . sucralfate  1 g Oral TID WC & HS      Otelia Santee, MD 04/26/2019, 10:54 AM

## 2019-04-26 NOTE — Progress Notes (Signed)
[facility]  Subjective  - Doing well and ready to go home.   Objective (!) 157/83 80 97.9 F (36.6 C) (Oral) 18 94%  Intake/Output Summary (Last 24 hours) at 04/26/2019 1145 Last data filed at 04/26/2019 1100 Gross per 24 hour  Intake 860 ml  Output 925 ml  Net -65 ml    Left UE incision with mild edema, but soft surrounding tissue. Left grip 5/5, palpable radial pulse, and intact sensation.  Palpable thrill in fistula. Lungs non labored breathing Gen NAD   Assessment/Planning: POD # 1 left BC fistula creation  No signs of steal F/U in Dr. Nicole Cella office in 5-6 weeks  Roxy Horseman 04/26/2019 11:45 AM --  Laboratory Lab Results: Recent Labs    04/25/19 1834 04/26/19 0720  WBC 10.9* 10.5  HGB 9.4* 8.2*  HCT 27.6* 24.7*  PLT 267 246   BMET Recent Labs    04/25/19 0512 04/26/19 0721  NA 135 133*  K 3.4* 3.5  CL 99 97*  CO2 24 23  GLUCOSE 102* 96  BUN 26* 35*  CREATININE 7.72* 9.60*  CALCIUM 7.9* 7.8*    COAG Lab Results  Component Value Date   INR 1.1 04/25/2019   INR 1.2 04/17/2019   INR 1.0 01/24/2019   No results found for: PTT

## 2019-04-26 NOTE — Discharge Summary (Signed)
Physician Discharge Summary  Jeremy Johnson URK:[id number] DOB: Apr 04, 1946 DOA: 04/14/2019  PCP: [name], MD  Admit date: 04/14/2019 Discharge date: 04/26/2019   Admitted From: Home Disposition: Home  Recommendations for Outpatient Follow-up:  1. Follow up with PCP in 1-2 weeks 2. Please obtain CBC/BMP/Mag at follow up 3. Please follow up on the following pending results: None  Home Health: None Equipment/Devices: None  Discharge Condition: Stable CODE STATUS: Full code  Hospital Course: 73 year old with history of limited stage small cell lung cancer, anemia of chronic disease, CKD-3, essential HTN hypothyroidism, HLD and CAD who was sent to the hospital by oncologist for evaluation of AKI with creatinine to 9.5 and anemia with Hgb to 6.9.Patient was transferred to [facility] for possible renal biopsy and initiation of dialysis.  Renal function continued to worsen. Patient underwent renal biopsy on 04/21/2019.  HD cath placed on 6/9 hemodialysis started on 6/10.  Had left brachiocephalic AVF on 8/31 without complication.  Patient to follow-up outpatient dialysis at [facility] kidney center on TTS.  See individual problem list below for more.  Discharge Diagnoses:  Acute on CKD-3/azotemia: Azotemia resolving with HD. 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 Cr 2.79 & 3.37 on 5/11 & 5/18 respectively.  -ATN from carboplatin, losartan/HCTZ and anemia could contribute. -Serologies not impressive.  No schistocytes on smear.  Haptoglobin high.   -Appreciate nephrology guidance -Not pursuing tx for anti-GBM after risk-benefit discussion with renal -HD started on 6/10.   -Left brachiocephalic aVF on 5/17 -Patient to continue outpatient dialysis [facility] T[facility].  Anion gap metabolic acidosis: Likely due to renal failure/azotemia.  Resolved.  Leukocytosis: there is some element of hemoconcentration.  No apparent source of infection.   Resolved.  IDA/AICD likely due to chemo and renal failure:  -Anemia panel suggestive IDA/ACD combined -Hgb 6.9 (admit)--1 unit--8.5--7.4--7.0--1 unit--8.2.  -Aransep and p.o. iron per nephrology -Recheck CBC at follow-up  Hematuria: ?due to GBM -Consider outpatient urology follow-up  Chronic CAD: No cardiopulmonary symptoms -Continue home metoprolol, aspirin and Zocor  Essential hypertension: Normotensive.  On amlodipine and metoprolol at home -Continue home metoprolol and amlodipine -Hold home losartan/HCTZ in the setting of AKI  Limited stage (T2b, N1, M0) small cell lung cancer: Diagnosed in 01/31/2019 -Recently started chemo with carboplatin, etoposide-received cycle 3 on 5/18. -Outpatient oncology follow-up  Thrombocytopenia: Resolved.  Hypokalemia and hypomagnesemia: Resolved.   History of gout: -Reduced home allopurinol to 100 mg daily  Discharge Instructions  Discharge Instructions    Call MD for:  extreme fatigue   Complete by: As directed    Call MD for:  persistant dizziness or light-headedness   Complete by: As directed    Call MD for:  persistant nausea and vomiting   Complete by: As directed    Call MD for:  redness, tenderness, or signs of infection (pain, swelling, redness, odor or green/yellow discharge around incision site)   Complete by: As directed    Call MD for:  severe uncontrolled pain   Complete by: As directed    Call MD for:  temperature >100.4   Complete by: As directed    Diet - low sodium heart healthy   Complete by: As directed    Discharge instructions   Complete by: As directed    It has been a pleasure taking care of you! You were admitted with kidney failure and started on dialysis.  You will continue dialysis as recommended by nephrology  at dialysis center.  They are some adjustments to your home medication based on your kidney function.  Please review medication list and the directions before you take them.  Follow-up with  your primary care doctors and your oncologist in 1 to 2 weeks.  Once you are discharged, your primary care physician will handle any further medical issues. Please note that NO REFILLS for any discharge medications will be authorized once you are discharged, as it is imperative that you return to your primary care physician (or establish a relationship with a primary care physician if you do not have one) for your aftercare needs so that they can reassess your need for medications and monitor your lab values. Take care,   Increase activity slowly   Complete by: As directed      Allergies as of 04/26/2019      Reactions   Penicillins Rash, Other (See Comments)   Has patient had a PCN reaction causing immediate rash, facial/tongue/throat swelling, SOB or lightheadedness with hypotension: No Has patient had a PCN reaction causing severe rash involving mucus membranes or skin necrosis: No Has patient had a PCN reaction that required hospitalization: No Has patient had a PCN reaction occurring within the last 10 years: No If all of the above answers are "NO", then may proceed with Cephalosporin use.      Medication List    STOP taking these medications   losartan-hydrochlorothiazide 100-25 MG tablet Commonly known as: HYZAAR     TAKE these medications   allopurinol 100 MG tablet Commonly known as: ZYLOPRIM Take 1 tablet (100 mg total) by mouth daily. What changed:   medication strength  how much to take   amLODipine 5 MG tablet Commonly known as: NORVASC Take 5 mg by mouth daily.   aspirin EC 81 MG tablet Take 81 mg by mouth daily.   calcitRIOL 0.25 MCG capsule Commonly known as: ROCALTROL Take 0.25 mcg by mouth daily.   Darbepoetin Alfa 100 MCG/0.5ML Sosy injection Commonly known as: ARANESP Inject 0.5 mLs (100 mcg total) into the vein every Thursday with hemodialysis. Start taking on: May 01, 2019   levothyroxine 175 MCG tablet Commonly known as: SYNTHROID Take 175  mcg by mouth daily before breakfast.   metoprolol tartrate 50 MG tablet Commonly known as: LOPRESSOR Take 25 mg by mouth 2 (two) times daily.   mometasone 0.1 % ointment Commonly known as: ELOCON Apply 1 application topically 2 (two) times daily as needed (for eczema).   omeprazole 20 MG capsule Commonly known as: PRILOSEC Take 20 mg by mouth daily.   prochlorperazine 10 MG tablet Commonly known as: COMPAZINE Take 1 tablet (10 mg total) by mouth every 6 (six) hours as needed for nausea or vomiting.   sevelamer carbonate 800 MG tablet Commonly known as: RENVELA Take 2 tablets (1,600 mg total) by mouth 3 (three) times daily with meals.   simvastatin 20 MG tablet Commonly known as: ZOCOR Take 20 mg by mouth daily.   sucralfate 1 g tablet Commonly known as: Carafate Take 1 tablet (1 g total) by mouth 4 (four) times daily -  with meals and at bedtime. 5 min before meals for radiation induced esophagitis        Consultations:  Nephrology  Vascular surgery  Procedures/Studies:  2D Echo: None  HD cath on 04/22/2019  Left brachiocephalic aVF on 7/78/2423    Ct Abdomen Pelvis Wo Contrast  Result Date: 04/17/2019 CLINICAL DATA:  Inpatient. Gross hematuria with passage of clots.  Acute renal failure. History of small cell lung cancer diagnosed March 2020 treated with chemotherapy. EXAM: CT ABDOMEN AND PELVIS WITHOUT CONTRAST TECHNIQUE: Multidetector CT imaging of the abdomen and pelvis was performed following the standard protocol without IV contrast. COMPARISON:  01/22/2019 PET-CT. FINDINGS: Lower chest: Trace dependent bilateral pleural effusions with mild dependent bibasilar atelectasis. Hepatobiliary: Normal liver size. No liver mass. Normal gallbladder with no radiopaque cholelithiasis. No biliary ductal dilatation. Pancreas: No pancreatic mass or duct dilation. There is mild haziness of the peripancreatic fat. There is new fat stranding and ill-defined fluid throughout the  anterior paranephric spaces bilaterally extending into the pelvis. Spleen: Normal size. No mass. Adrenals/Urinary Tract: Normal adrenals. No hydronephrosis. No renal stones. Normal caliber ureters. No ureteral stones. Several simple renal cysts scattered in the right kidney, largest 7.2 cm in posterior lower right kidney. No contour deforming left renal masses. Normal bladder. Stomach/Bowel: Small hiatal hernia. Otherwise normal nondistended stomach. Normal caliber small bowel with no small bowel wall thickening. Appendix not discretely visualized. Oral contrast transits to left colon. Partial visualization of a midline supraumbilical ventral hernia containing a small portion of the transverse colon, with no evidence of associated colonic wall thickening, pneumatosis or caliber transition. Small left inguinal hernia contains a small portion of the proximal sigmoid colon, with no associated colonic wall thickening, pneumatosis or caliber transition. No large bowel wall thickening, significant diverticulosis or significant pericolonic fat stranding. Vascular/Lymphatic: Atherosclerotic abdominal aorta with 3.2 cm infrarenal abdominal aortic aneurysm. No pathologically enlarged lymph nodes in the abdomen or pelvis. Reproductive: Top-normal size prostate. Other: No pneumoperitoneum, ascites or focal fluid collection. Musculoskeletal: No aggressive appearing focal osseous lesions. Moderate thoracolumbar spondylosis. IMPRESSION: 1. No urolithiasis. No hydronephrosis. No acute bladder abnormality. 2. Mild haziness of the peripancreatic fat. New fat stranding and ill-defined fluid throughout the anterior paranephric retroperitoneal spaces bilaterally extending into the pelvis. Acute pancreatitis cannot be excluded. Suggest correlation with serum lipase. No biliary or pancreatic duct dilation on this noncontrast scan. 3. Trace dependent bilateral pleural effusions. 4. Midline supraumbilical ventral hernia contains a portion  of the transverse colon. Left inguinal hernia contains a portion of the sigmoid colon. No acute bowel complication. 5. Small hiatal hernia. 6. Infrarenal 3.2 cm Abdominal Aortic Aneurysm (ICD10-I71.9). Recommend follow-up aortic ultrasound in 3 years. This recommendation follows ACR consensus guidelines: White Paper of the ACR Incidental Findings Committee II on Vascular Findings. J Am Coll Radiol 2013; 10:789-794. 7.  Aortic Atherosclerosis (ICD10-I70.0). Electronically Signed   By: Ilona Sorrel M.D.   On: 04/17/2019 16:09   Dg Chest 2 View  Result Date: 04/14/2019 CLINICAL DATA:  Anemia.  History of lung cancer. EXAM: CHEST - 2 VIEW COMPARISON:  Chest x-ray 01/24/2019 and chest CT 03/24/2019 FINDINGS: The cardiac silhouette, mediastinal and hilar contours are within normal limits and stable. There is mild tortuosity of the thoracic aorta. Mild chronic emphysematous changes with areas of pulmonary scarring. No infiltrates, edema or effusions. No worrisome pulmonary lesions. IMPRESSION: Chronic emphysematous changes and pulmonary scarring but no definite acute overlying pulmonary process or new pulmonary lesions. Electronically Signed   By: Marijo Sanes M.D.   On: 04/14/2019 13:20   US Renal  Result Date: 04/15/2019 CLINICAL DATA:  Acute renal disease.  Chronic renal disease. EXAM: RENAL / URINARY TRACT ULTRASOUND COMPLETE COMPARISON:  PET-CT 01/22/2019. FINDINGS: Right Kidney: Renal measurements: 12.8 x 6.0 x 6.1 cm = volume: 242.3 mL. Increased echogenicity. 8.8 cm simple cyst. 3.6 cm simple cyst. No hydronephrosis visualized. Left Kidney:  Renal measurements: 12.9 x 6.1 x 5.3 cm = volume: 219.5 mL. Increased echogenicity. No mass or hydronephrosis visualized. Bladder: Appears normal for degree of bladder distention. IMPRESSION: Increased echogenicity both kidneys. This consistent chronic medical renal disease. Two simple cyst right kidney. No evidence of hydronephrosis or bladder distention. No acute  abnormality identified. Electronically Signed   By: Marcello Moores  Register   On: 04/15/2019 11:33   Ir Fluoro Guide Cv Line Right  Result Date: 04/22/2019 INDICATION: Acute kidney injury, access for dialysis EXAM: ULTRASOUND GUIDANCE FOR VASCULAR ACCESS RIGHT INTERNAL JUGULAR PERMANENT HEMODIALYSIS CATHETER Date:  04/22/2019 04/22/2019 1:31 pm Radiologist:  Jerilynn Mages. Daryll Brod, MD Guidance:  Ultrasound and fluoroscopic FLUOROSCOPY TIME:  Fluoroscopy Time: 0 minutes 32 seconds (4 mGy). MEDICATIONS: 1 g vancomycin within hour of the procedure ANESTHESIA/SEDATION: Versed 0 mg IV; Fentanyl 50 mcg IV; Moderate Sedation Time:  None. The patient was continuously monitored during the procedure by the interventional radiology nurse under my direct supervision. CONTRAST:  None. COMPLICATIONS: None immediate. PROCEDURE: Informed consent was obtained from the patient following explanation of the procedure, risks, benefits and alternatives. The patient understands, agrees and consents for the procedure. All questions were addressed. A time out was performed. Maximal barrier sterile technique utilized including caps, mask, sterile gowns, sterile gloves, large sterile drape, hand hygiene, and 2% chlorhexidine scrub. Under sterile conditions and local anesthesia, right internal jugular micropuncture venous access was performed with ultrasound. Images were obtained for documentation of the patent right internal jugular vein. A guide wire was inserted followed by a transitional dilator. Next, a 0.035 guidewire was advanced into the IVC with a 5-French catheter. Measurements were obtained from the right venotomy site to the proximal right atrium. In the right infraclavicular chest, a subcutaneous tunnel was created under sterile conditions and local anesthesia. 1% lidocaine with epinephrine was utilized for this. The 23 cm tip to cuff palindrome catheter was tunneled subcutaneously to the venotomy site and inserted into the SVC/RA junction  through a valved peel-away sheath. Position was confirmed with fluoroscopy. Images were obtained for documentation. Blood was aspirated from the catheter followed by saline and heparin flushes. The appropriate volume and strength of heparin was instilled in each lumen. Caps were applied. The catheter was secured at the tunnel site with Gelfoam and a pursestring suture. The venotomy site was closed with subcuticular Vicryl suture. Dermabond was applied to the small right neck incision. A dry sterile dressing was applied. The catheter is ready for use. No immediate complications. IMPRESSION: Ultrasound and fluoroscopically guided right internal jugular tunneled hemodialysis catheter (23 cm tip to cuff palindrome catheter). Electronically Signed   By: Jerilynn Mages.  Shick M.D.   On: 04/22/2019 13:36   Ir US Guide Vasc Access Right  Result Date: 04/22/2019 INDICATION: Acute kidney injury, access for dialysis EXAM: ULTRASOUND GUIDANCE FOR VASCULAR ACCESS RIGHT INTERNAL JUGULAR PERMANENT HEMODIALYSIS CATHETER Date:  04/22/2019 04/22/2019 1:31 pm Radiologist:  Jerilynn Mages. Daryll Brod, MD Guidance:  Ultrasound and fluoroscopic FLUOROSCOPY TIME:  Fluoroscopy Time: 0 minutes 32 seconds (4 mGy). MEDICATIONS: 1 g vancomycin within hour of the procedure ANESTHESIA/SEDATION: Versed 0 mg IV; Fentanyl 50 mcg IV; Moderate Sedation Time:  None. The patient was continuously monitored during the procedure by the interventional radiology nurse under my direct supervision. CONTRAST:  None. COMPLICATIONS: None immediate. PROCEDURE: Informed consent was obtained from the patient following explanation of the procedure, risks, benefits and alternatives. The patient understands, agrees and consents for the procedure. All questions were addressed. A time out was performed.  Maximal barrier sterile technique utilized including caps, mask, sterile gowns, sterile gloves, large sterile drape, hand hygiene, and 2% chlorhexidine scrub. Under sterile conditions and local  anesthesia, right internal jugular micropuncture venous access was performed with ultrasound. Images were obtained for documentation of the patent right internal jugular vein. A guide wire was inserted followed by a transitional dilator. Next, a 0.035 guidewire was advanced into the IVC with a 5-French catheter. Measurements were obtained from the right venotomy site to the proximal right atrium. In the right infraclavicular chest, a subcutaneous tunnel was created under sterile conditions and local anesthesia. 1% lidocaine with epinephrine was utilized for this. The 23 cm tip to cuff palindrome catheter was tunneled subcutaneously to the venotomy site and inserted into the SVC/RA junction through a valved peel-away sheath. Position was confirmed with fluoroscopy. Images were obtained for documentation. Blood was aspirated from the catheter followed by saline and heparin flushes. The appropriate volume and strength of heparin was instilled in each lumen. Caps were applied. The catheter was secured at the tunnel site with Gelfoam and a pursestring suture. The venotomy site was closed with subcuticular Vicryl suture. Dermabond was applied to the small right neck incision. A dry sterile dressing was applied. The catheter is ready for use. No immediate complications. IMPRESSION: Ultrasound and fluoroscopically guided right internal jugular tunneled hemodialysis catheter (23 cm tip to cuff palindrome catheter). Electronically Signed   By: Jerilynn Mages.  Shick M.D.   On: 04/22/2019 13:36   US Biopsy (kidney)  Result Date: 04/21/2019 INDICATION: Acute kidney injury EXAM: ULTRASOUND GUIDED CORE BIOPSY OF LEFT KIDNEY MEDICATIONS: 1% LIDOCAINE LOCAL ANESTHESIA/SEDATION: Versed 2.0mg  IV; Fentanyl 44mcg IV; Moderate Sedation Time:  11 minutes The patient was continuously monitored during the procedure by the interventional radiology nurse under my direct supervision. FLUOROSCOPY TIME:  Fluoroscopy Time: None. COMPLICATIONS: None  immediate. PROCEDURE: The procedure, risks, benefits, and alternatives were explained to the patient. Questions regarding the procedure were encouraged and answered. The patient understands and consents to the procedure. The left flank was prepped with ChloraPrep in a sterile fashion, and a sterile drape was applied covering the operative field. A sterile gown and sterile gloves were used for the procedure. Local anesthesia was provided with 1% Lidocaine. Patient positioned prone. Preliminary ultrasound performed. The left kidney lower pole was localized. Overlying skin marked. Under sterile conditions and local anesthesia, a 15 gauge coaxial guide needle was advanced to the left kidney lower pole. Needle position confirmed with ultrasound. 2 16 gauge core biopsies obtained of the cortex. Needle position confirmed with ultrasound. Images obtained for documentation. Needle tract occluded with Gel-Foam. Patient tolerated the procedure well. FINDINGS: Imaging confirms needle placed into the left kidney lower pole cortex for core biopsy IMPRESSION: Successful ultrasound left renal 16 gauge core biopsy Electronically Signed   By: Jerilynn Mages.  Shick M.D.   On: 04/21/2019 09:41   Vas [place] Upper Ext Vein Mapping (pre-op Avf)  Result Date: 04/24/2019 UPPER EXTREMITY VEIN MAPPING  Indications: Pre-access. Comparison Study: No prior studies.  Examination Guidelines: A complete evaluation includes B-mode imaging, spectral Doppler, color Doppler, and power Doppler as needed of all accessible portions of each vessel. Bilateral testing is considered an integral part of a complete examination. Limited examinations for reoccurring indications may be performed as noted. +-----------------+-------------+----------+---------+ Right Cephalic   Diameter (cm)Depth (cm)Findings  +-----------------+-------------+----------+---------+ Shoulder             0.48        0.83             +-----------------+-------------+----------+---------+  Prox upper arm       0.40        0.93             +-----------------+-------------+----------+---------+ Mid upper arm        0.38        0.39             +-----------------+-------------+----------+---------+ Dist upper arm       0.32        0.38             +-----------------+-------------+----------+---------+ Antecubital fossa    0.33        0.58   branching +-----------------+-------------+----------+---------+ Prox forearm         0.27        0.52             +-----------------+-------------+----------+---------+ Mid forearm          0.22        0.48             +-----------------+-------------+----------+---------+ Dist forearm         0.25        0.31             +-----------------+-------------+----------+---------+ +-----------------+-------------+----------+---------+ Left Cephalic    Diameter (cm)Depth (cm)Findings  +-----------------+-------------+----------+---------+ Shoulder             0.42        1.08             +-----------------+-------------+----------+---------+ Prox upper arm       0.35        1.14             +-----------------+-------------+----------+---------+ Mid upper arm        0.32        0.40   branching +-----------------+-------------+----------+---------+ Dist upper arm       0.44        0.38             +-----------------+-------------+----------+---------+ Antecubital fossa    0.43        0.40   branching +-----------------+-------------+----------+---------+ Prox forearm         0.38        0.48   branching +-----------------+-------------+----------+---------+ Mid forearm          0.29        0.45             +-----------------+-------------+----------+---------+ Dist forearm         0.22        0.27             +-----------------+-------------+----------+---------+ *See table(s) above for measurements and observations.  Diagnosing physician: Servando Snare MD Electronically signed by Servando Snare MD  on 04/24/2019 at 5:32:07 PM.    Final      Subjective: No major events overnight of this morning.  Had left brachiocephalic aVF yesterday.  No significant pain.  Receiving dialysis today.  Excited and ready to go home.   Discharge Exam: Vitals:   04/26/19 0915 04/26/19 0945  BP: 139/76 (!) 157/92  Pulse: 77 82  Resp:    Temp:    SpO2:      GENERAL: No acute distress.  Appears well.  HEENT: MMM.  Vision and hearing grossly intact.  NECK: Supple.  No JVD.  LUNGS:  No IWOB. Good air movement bilaterally. HEART:  RRR. Heart sounds normal.  ABD: Bowel sounds present. Soft. Non tender.  MSK/EXT:  Moves all extremities. No apparent deformity. No edema  bilaterally.  Left BC AVF with good bruits. SKIN: no apparent skin lesion or wound NEURO: Awake, alert and oriented appropriately.  No gross deficit.  PSYCH: Calm. Normal affect.   The results of significant diagnostics from this hospitalization (including imaging, microbiology, ancillary and laboratory) are listed below for reference.     Microbiology: Recent Results (from the past 240 hour(s))  Surgical pcr screen     Status: None   Collection Time: 04/25/19  7:35 AM   Specimen: Nasal Mucosa; Nasal Swab  Result Value Ref Range Status   MRSA, PCR NEGATIVE NEGATIVE Final   Staphylococcus aureus NEGATIVE NEGATIVE Final    Comment: (NOTE) The Xpert SA Assay (FDA approved for NASAL specimens in patients 33 years of age and older), is one component of a comprehensive surveillance program. It is not intended to diagnose infection nor to guide or monitor treatment. Performed at [facility], [place] [address], [place], [place] [address]      Labs: BNP (last 3 results) No results for input(s): BNP in the last 8760 hours. Basic Metabolic Panel: Recent Labs  Lab 04/20/19 0449 04/21/19 0541 04/22/19 0612 04/23/19 0934 04/24/19 0631 04/24/19 1648 04/25/19 0512 04/26/19 0721  NA 137 135 134* 136 134*  --  135 133*  K 3.6  3.9 3.3* 3.1* 3.5  --  3.4* 3.5  CL 102 102 96* 100 100  --  99 97*  CO2 19* 19* 18* 21* 20*  --  24 23  GLUCOSE 101* 94 103* 154* 101*  --  102* 96  BUN 74* 79* 87* 51* 57*  --  26* 35*  CREATININE 13.78* 14.89* 15.95* 11.18* 12.54*  --  7.72* 9.60*  CALCIUM 7.8* 7.8* 8.2* 7.6* 7.7*  --  7.9* 7.8*  MG 1.7 1.7 1.9 1.6*  --  2.1  --   --   PHOS 7.2* 8.5* 8.7* 4.3 5.2*  --  3.7 5.0*   Liver Function Tests: Recent Labs  Lab 04/20/19 0449 04/21/19 0541 04/22/19 0612 04/23/19 0934 04/24/19 0631 04/25/19 0512 04/26/19 0721  AST 20 21  --  25  --   --   --   ALT 17 18  --  25  --   --   --   ALKPHOS 74 76  --  74  --   --   --   BILITOT 0.3 0.4  --  0.6  --   --   --   PROT 4.9* 5.0*  --  5.7*  --   --   --   ALBUMIN 1.8* 1.7* 2.0* 1.7* 1.7* 1.9* 1.7*   No results for input(s): LIPASE, AMYLASE in the last 168 hours. No results for input(s): AMMONIA in the last 168 hours. CBC: Recent Labs  Lab 04/20/19 0449  04/23/19 0934 04/24/19 1311 04/25/19 0512 04/25/19 1834 04/26/19 0720  WBC 12.1*   < > 9.8 10.3 10.2 10.9* 10.5  NEUTROABS 8.6*  --   --   --   --   --   --   HGB 7.4*   < > 7.4* 7.0* 8.4* 9.4* 8.2*  HCT 21.4*   < > 20.9* 20.5* 24.3* 27.6* 24.7*  MCV 87.3   < > 86.4 89.9 87.1 88.7 90.8  PLT 133*   < > 211 233 252 267 246   < > = values in this interval not displayed.   Cardiac Enzymes: Recent Labs  Lab 04/23/19 0934  CKTOTAL 72   BNP: Invalid input(s):  POCBNP CBG: No results for input(s): GLUCAP in the last 168 hours. D-Dimer No results for input(s): DDIMER in the last 72 hours. Hgb A1c Recent Labs    04/25/19 0512  HGBA1C 6.5*   Lipid Profile No results for input(s): CHOL, HDL, LDLCALC, TRIG, CHOLHDL, LDLDIRECT in the last 72 hours. Thyroid function studies No results for input(s): TSH, T4TOTAL, T3FREE, THYROIDAB in the last 72 hours.  Invalid input(s): FREET3 Anemia work up No results for input(s): VITAMINB12, FOLATE, FERRITIN, TIBC, IRON,  RETICCTPCT in the last 72 hours. Urinalysis    Component Value Date/Time   COLORURINE STRAW (A) 04/15/2019 0913   APPEARANCEUR CLEAR 04/15/2019 0913   LABSPEC 1.010 04/15/2019 0913   PHURINE 7.0 04/15/2019 0913   GLUCOSEU 50 (A) 04/15/2019 0913   HGBUR MODERATE (A) 04/15/2019 0913   BILIRUBINUR NEGATIVE 04/15/2019 0913   KETONESUR NEGATIVE 04/15/2019 0913   PROTEINUR >=300 (A) 04/15/2019 0913   NITRITE NEGATIVE 04/15/2019 0913   LEUKOCYTESUR NEGATIVE 04/15/2019 0913   Sepsis Labs Invalid input(s): PROCALCITONIN,  WBC,  LACTICIDVEN   Time coordinating discharge: 35 minutes  SIGNED:  Mercy Riding, MD  [facility] 04/26/2019, 10:28 AM Pager [phone]  If 7PM-7AM, please contact night-coverage [email or web address] Password [email or web address]

## 2019-04-26 NOTE — Progress Notes (Signed)
Pt stated he will call his wife for update

## 2019-04-28 ENCOUNTER — Telehealth: Payer: Self-pay | Admitting: *Deleted

## 2019-04-28 ENCOUNTER — Inpatient Hospital Stay: Payer: Medicare Other

## 2019-04-28 ENCOUNTER — Other Ambulatory Visit: Payer: Medicare Other

## 2019-04-28 ENCOUNTER — Other Ambulatory Visit: Payer: Self-pay

## 2019-04-28 ENCOUNTER — Ambulatory Visit
Admission: RE | Admit: 2019-04-28 | Discharge: 2019-04-28 | Disposition: A | Discharge status: Home / Self Care | Payer: Medicare Other | Source: Ambulatory Visit | Attending: Radiation Oncology | Admitting: Radiation Oncology

## 2019-04-28 DIAGNOSIS — Z5111 Encounter for antineoplastic chemotherapy: Secondary | ICD-10-CM | POA: Diagnosis not present

## 2019-04-28 DIAGNOSIS — I129 Hypertensive chronic kidney disease with stage 1 through stage 4 chronic kidney disease, or unspecified chronic kidney disease: Secondary | ICD-10-CM | POA: Diagnosis not present

## 2019-04-28 DIAGNOSIS — C3412 Malignant neoplasm of upper lobe, left bronchus or lung: Secondary | ICD-10-CM | POA: Diagnosis not present

## 2019-04-28 DIAGNOSIS — K219 Gastro-esophageal reflux disease without esophagitis: Secondary | ICD-10-CM | POA: Diagnosis not present

## 2019-04-28 DIAGNOSIS — Z5189 Encounter for other specified aftercare: Secondary | ICD-10-CM | POA: Diagnosis present

## 2019-04-28 DIAGNOSIS — C3481 Malignant neoplasm of overlapping sites of right bronchus and lung: Secondary | ICD-10-CM | POA: Diagnosis not present

## 2019-04-28 DIAGNOSIS — Z992 Dependence on renal dialysis: Secondary | ICD-10-CM | POA: Diagnosis not present

## 2019-04-28 DIAGNOSIS — E785 Hyperlipidemia, unspecified: Secondary | ICD-10-CM | POA: Diagnosis not present

## 2019-04-28 DIAGNOSIS — C3492 Malignant neoplasm of unspecified part of left bronchus or lung: Secondary | ICD-10-CM

## 2019-04-28 DIAGNOSIS — N189 Chronic kidney disease, unspecified: Secondary | ICD-10-CM | POA: Diagnosis not present

## 2019-04-28 DIAGNOSIS — Z79899 Other long term (current) drug therapy: Secondary | ICD-10-CM | POA: Diagnosis not present

## 2019-04-28 DIAGNOSIS — Z7982 Long term (current) use of aspirin: Secondary | ICD-10-CM | POA: Diagnosis not present

## 2019-04-28 DIAGNOSIS — Z7951 Long term (current) use of inhaled steroids: Secondary | ICD-10-CM | POA: Diagnosis not present

## 2019-04-28 DIAGNOSIS — Z510 Encounter for antineoplastic radiation therapy: Secondary | ICD-10-CM | POA: Diagnosis not present

## 2019-04-28 DIAGNOSIS — D649 Anemia, unspecified: Secondary | ICD-10-CM | POA: Diagnosis not present

## 2019-04-28 LAB — CMP (CANCER CENTER ONLY)
ALT: 25 U/L (ref 0–44)
AST: 28 U/L (ref 15–41)
Albumin: 2.1 g/dL — ABNORMAL LOW (ref 3.5–5.0)
Alkaline Phosphatase: 93 U/L (ref 38–126)
Anion gap: 13 (ref 5–15)
BUN: 29 mg/dL — ABNORMAL HIGH (ref 8–23)
CO2: 25 mmol/L (ref 22–32)
Calcium: 8.1 mg/dL — ABNORMAL LOW (ref 8.9–10.3)
Chloride: 97 mmol/L — ABNORMAL LOW (ref 98–111)
Creatinine: 9.69 mg/dL (ref 0.61–1.24)
GFR, Est AFR Am: 6 mL/min — ABNORMAL LOW (ref >60)
GFR, Estimated: 5 mL/min — ABNORMAL LOW (ref >60)
Glucose, Bld: 96 mg/dL (ref 70–99)
Potassium: 3.9 mmol/L (ref 3.5–5.1)
Sodium: 135 mmol/L (ref 135–145)
Total Bilirubin: 0.4 mg/dL (ref 0.3–1.2)
Total Protein: 7.1 g/dL (ref 6.5–8.1)

## 2019-04-28 LAB — CBC WITH DIFFERENTIAL (CANCER CENTER ONLY)
Abs Immature Granulocytes: 0.51 10*3/uL — ABNORMAL HIGH (ref 0.00–0.07)
Basophils Absolute: 0.1 10*3/uL (ref 0.0–0.1)
Basophils Relative: 1 %
Eosinophils Absolute: 0.1 10*3/uL (ref 0.0–0.5)
Eosinophils Relative: 1 %
HCT: 28.2 % — ABNORMAL LOW (ref 39.0–52.0)
Hemoglobin: 9.1 g/dL — ABNORMAL LOW (ref 13.0–17.0)
Immature Granulocytes: 4 %
Lymphocytes Relative: 13 %
Lymphs Abs: 1.7 10*3/uL (ref 0.7–4.0)
MCH: 30.3 pg (ref 26.0–34.0)
MCHC: 32.3 g/dL (ref 30.0–36.0)
MCV: 94 fL (ref 80.0–100.0)
Monocytes Absolute: 2.9 10*3/uL — ABNORMAL HIGH (ref 0.1–1.0)
Monocytes Relative: 22 %
Neutro Abs: 8 10*3/uL — ABNORMAL HIGH (ref 1.7–7.7)
Neutrophils Relative %: 59 %
Platelet Count: 253 10*3/uL (ref 150–400)
RBC: 3 MIL/uL — ABNORMAL LOW (ref 4.22–5.81)
RDW: 16 % — ABNORMAL HIGH (ref 11.5–15.5)
WBC Count: 13.2 10*3/uL — ABNORMAL HIGH (ref 4.0–10.5)
nRBC: 0 % (ref 0.0–0.2)

## 2019-04-28 NOTE — Telephone Encounter (Signed)
Received call from [facility] in [facility] lab with Creatinine level of 9.69. Pt is a new hemo dialysis patient. Dr. Julien Nordmann made aware.

## 2019-04-29 ENCOUNTER — Other Ambulatory Visit: Payer: Self-pay

## 2019-04-29 ENCOUNTER — Ambulatory Visit
Admission: RE | Admit: 2019-04-29 | Discharge: 2019-04-29 | Disposition: A | Discharge status: Home / Self Care | Payer: Medicare Other | Source: Ambulatory Visit | Attending: Radiation Oncology | Admitting: Radiation Oncology

## 2019-04-29 DIAGNOSIS — N2581 Secondary hyperparathyroidism of renal origin: Secondary | ICD-10-CM | POA: Diagnosis not present

## 2019-04-29 DIAGNOSIS — Z510 Encounter for antineoplastic radiation therapy: Secondary | ICD-10-CM | POA: Diagnosis not present

## 2019-04-29 DIAGNOSIS — Z23 Encounter for immunization: Secondary | ICD-10-CM | POA: Diagnosis not present

## 2019-04-29 DIAGNOSIS — D689 Coagulation defect, unspecified: Secondary | ICD-10-CM | POA: Diagnosis not present

## 2019-04-29 DIAGNOSIS — E876 Hypokalemia: Secondary | ICD-10-CM | POA: Diagnosis not present

## 2019-04-29 DIAGNOSIS — C3412 Malignant neoplasm of upper lobe, left bronchus or lung: Secondary | ICD-10-CM | POA: Diagnosis not present

## 2019-04-29 DIAGNOSIS — N186 End stage renal disease: Secondary | ICD-10-CM | POA: Diagnosis not present

## 2019-04-30 ENCOUNTER — Other Ambulatory Visit: Payer: Self-pay

## 2019-04-30 ENCOUNTER — Ambulatory Visit
Admission: RE | Admit: 2019-04-30 | Discharge: 2019-04-30 | Disposition: A | Discharge status: Home / Self Care | Payer: Medicare Other | Source: Ambulatory Visit | Attending: Radiation Oncology | Admitting: Radiation Oncology

## 2019-04-30 DIAGNOSIS — Z510 Encounter for antineoplastic radiation therapy: Secondary | ICD-10-CM | POA: Diagnosis not present

## 2019-04-30 DIAGNOSIS — C3412 Malignant neoplasm of upper lobe, left bronchus or lung: Secondary | ICD-10-CM | POA: Diagnosis not present

## 2019-05-01 ENCOUNTER — Ambulatory Visit
Admission: RE | Admit: 2019-05-01 | Discharge: 2019-05-01 | Disposition: A | Discharge status: Home / Self Care | Payer: Medicare Other | Source: Ambulatory Visit | Attending: Radiation Oncology | Admitting: Radiation Oncology

## 2019-05-01 ENCOUNTER — Other Ambulatory Visit: Payer: Self-pay

## 2019-05-01 ENCOUNTER — Ambulatory Visit: Payer: Medicare Other

## 2019-05-01 DIAGNOSIS — Z510 Encounter for antineoplastic radiation therapy: Secondary | ICD-10-CM | POA: Diagnosis not present

## 2019-05-01 DIAGNOSIS — N2581 Secondary hyperparathyroidism of renal origin: Secondary | ICD-10-CM | POA: Diagnosis not present

## 2019-05-01 DIAGNOSIS — D689 Coagulation defect, unspecified: Secondary | ICD-10-CM | POA: Diagnosis not present

## 2019-05-01 DIAGNOSIS — C3412 Malignant neoplasm of upper lobe, left bronchus or lung: Secondary | ICD-10-CM | POA: Diagnosis not present

## 2019-05-01 DIAGNOSIS — Z23 Encounter for immunization: Secondary | ICD-10-CM | POA: Diagnosis not present

## 2019-05-01 DIAGNOSIS — N186 End stage renal disease: Secondary | ICD-10-CM | POA: Diagnosis not present

## 2019-05-01 DIAGNOSIS — E876 Hypokalemia: Secondary | ICD-10-CM | POA: Diagnosis not present

## 2019-05-02 ENCOUNTER — Telehealth: Payer: Self-pay | Admitting: Medical Oncology

## 2019-05-02 ENCOUNTER — Ambulatory Visit: Payer: Medicare Other

## 2019-05-02 ENCOUNTER — Ambulatory Visit
Admission: RE | Admit: 2019-05-02 | Discharge: 2019-05-02 | Disposition: A | Discharge status: Home / Self Care | Payer: Medicare Other | Source: Ambulatory Visit | Attending: Radiation Oncology | Admitting: Radiation Oncology

## 2019-05-02 ENCOUNTER — Other Ambulatory Visit: Payer: Self-pay

## 2019-05-02 DIAGNOSIS — C3412 Malignant neoplasm of upper lobe, left bronchus or lung: Secondary | ICD-10-CM | POA: Diagnosis not present

## 2019-05-02 DIAGNOSIS — Z510 Encounter for antineoplastic radiation therapy: Secondary | ICD-10-CM | POA: Diagnosis not present

## 2019-05-02 DIAGNOSIS — E46 Unspecified protein-calorie malnutrition: Secondary | ICD-10-CM | POA: Insufficient documentation

## 2019-05-02 NOTE — Telephone Encounter (Signed)
Chemo appts - Pt wants to make sure we coordinate chemo with dialysis which is on on Tuesday,thurs and sat 12-4.  Next lab , Cassie and chemo is June 29th ( Monday ).

## 2019-05-03 DIAGNOSIS — N2581 Secondary hyperparathyroidism of renal origin: Secondary | ICD-10-CM | POA: Diagnosis not present

## 2019-05-03 DIAGNOSIS — D689 Coagulation defect, unspecified: Secondary | ICD-10-CM | POA: Diagnosis not present

## 2019-05-03 DIAGNOSIS — N186 End stage renal disease: Secondary | ICD-10-CM | POA: Diagnosis not present

## 2019-05-03 DIAGNOSIS — E876 Hypokalemia: Secondary | ICD-10-CM | POA: Diagnosis not present

## 2019-05-03 DIAGNOSIS — Z23 Encounter for immunization: Secondary | ICD-10-CM | POA: Diagnosis not present

## 2019-05-03 NOTE — Telephone Encounter (Signed)
No. I will see him with Cassie

## 2019-05-05 ENCOUNTER — Telehealth: Payer: Self-pay | Admitting: *Deleted

## 2019-05-05 ENCOUNTER — Ambulatory Visit
Admission: RE | Admit: 2019-05-05 | Discharge: 2019-05-05 | Disposition: A | Discharge status: Home / Self Care | Payer: Medicare Other | Source: Ambulatory Visit | Attending: Radiation Oncology | Admitting: Radiation Oncology

## 2019-05-05 ENCOUNTER — Other Ambulatory Visit: Payer: Self-pay

## 2019-05-05 ENCOUNTER — Ambulatory Visit: Payer: Medicare Other

## 2019-05-05 ENCOUNTER — Inpatient Hospital Stay: Payer: Medicare Other

## 2019-05-05 DIAGNOSIS — Z7982 Long term (current) use of aspirin: Secondary | ICD-10-CM | POA: Diagnosis not present

## 2019-05-05 DIAGNOSIS — C3412 Malignant neoplasm of upper lobe, left bronchus or lung: Secondary | ICD-10-CM | POA: Diagnosis not present

## 2019-05-05 DIAGNOSIS — Z7951 Long term (current) use of inhaled steroids: Secondary | ICD-10-CM | POA: Diagnosis not present

## 2019-05-05 DIAGNOSIS — I129 Hypertensive chronic kidney disease with stage 1 through stage 4 chronic kidney disease, or unspecified chronic kidney disease: Secondary | ICD-10-CM | POA: Diagnosis not present

## 2019-05-05 DIAGNOSIS — Z510 Encounter for antineoplastic radiation therapy: Secondary | ICD-10-CM | POA: Diagnosis not present

## 2019-05-05 DIAGNOSIS — N189 Chronic kidney disease, unspecified: Secondary | ICD-10-CM | POA: Diagnosis not present

## 2019-05-05 DIAGNOSIS — D649 Anemia, unspecified: Secondary | ICD-10-CM | POA: Diagnosis not present

## 2019-05-05 DIAGNOSIS — Z79899 Other long term (current) drug therapy: Secondary | ICD-10-CM | POA: Diagnosis not present

## 2019-05-05 DIAGNOSIS — K219 Gastro-esophageal reflux disease without esophagitis: Secondary | ICD-10-CM | POA: Diagnosis not present

## 2019-05-05 DIAGNOSIS — C3481 Malignant neoplasm of overlapping sites of right bronchus and lung: Secondary | ICD-10-CM | POA: Diagnosis not present

## 2019-05-05 DIAGNOSIS — Z992 Dependence on renal dialysis: Secondary | ICD-10-CM | POA: Diagnosis not present

## 2019-05-05 DIAGNOSIS — E785 Hyperlipidemia, unspecified: Secondary | ICD-10-CM | POA: Diagnosis not present

## 2019-05-05 DIAGNOSIS — C3491 Malignant neoplasm of unspecified part of right bronchus or lung: Secondary | ICD-10-CM | POA: Diagnosis not present

## 2019-05-05 DIAGNOSIS — C3492 Malignant neoplasm of unspecified part of left bronchus or lung: Secondary | ICD-10-CM

## 2019-05-05 DIAGNOSIS — N186 End stage renal disease: Secondary | ICD-10-CM | POA: Diagnosis not present

## 2019-05-05 DIAGNOSIS — Z5111 Encounter for antineoplastic chemotherapy: Secondary | ICD-10-CM | POA: Diagnosis not present

## 2019-05-05 LAB — CBC WITH DIFFERENTIAL (CANCER CENTER ONLY)
Abs Immature Granulocytes: 0.21 10*3/uL — ABNORMAL HIGH (ref 0.00–0.07)
Basophils Absolute: 0.1 10*3/uL (ref 0.0–0.1)
Basophils Relative: 1 %
Eosinophils Absolute: 0.2 10*3/uL (ref 0.0–0.5)
Eosinophils Relative: 2 %
HCT: 29.3 % — ABNORMAL LOW (ref 39.0–52.0)
Hemoglobin: 9.6 g/dL — ABNORMAL LOW (ref 13.0–17.0)
Immature Granulocytes: 2 %
Lymphocytes Relative: 14 %
Lymphs Abs: 1.6 10*3/uL (ref 0.7–4.0)
MCH: 30.6 pg (ref 26.0–34.0)
MCHC: 32.8 g/dL (ref 30.0–36.0)
MCV: 93.3 fL (ref 80.0–100.0)
Monocytes Absolute: 1.6 10*3/uL — ABNORMAL HIGH (ref 0.1–1.0)
Monocytes Relative: 15 %
Neutro Abs: 7.2 10*3/uL (ref 1.7–7.7)
Neutrophils Relative %: 66 %
Platelet Count: 283 10*3/uL (ref 150–400)
RBC: 3.14 MIL/uL — ABNORMAL LOW (ref 4.22–5.81)
RDW: 16.3 % — ABNORMAL HIGH (ref 11.5–15.5)
WBC Count: 10.9 10*3/uL — ABNORMAL HIGH (ref 4.0–10.5)
nRBC: 0 % (ref 0.0–0.2)

## 2019-05-05 LAB — CMP (CANCER CENTER ONLY)
ALT: 17 U/L (ref 0–44)
AST: 19 U/L (ref 15–41)
Albumin: 2.4 g/dL — ABNORMAL LOW (ref 3.5–5.0)
Alkaline Phosphatase: 102 U/L (ref 38–126)
Anion gap: 14 (ref 5–15)
BUN: 26 mg/dL — ABNORMAL HIGH (ref 8–23)
CO2: 27 mmol/L (ref 22–32)
Calcium: 8.3 mg/dL — ABNORMAL LOW (ref 8.9–10.3)
Chloride: 93 mmol/L — ABNORMAL LOW (ref 98–111)
Creatinine: 8.05 mg/dL (ref 0.61–1.24)
GFR, Est AFR Am: 7 mL/min — ABNORMAL LOW (ref >60)
GFR, Estimated: 6 mL/min — ABNORMAL LOW (ref >60)
Glucose, Bld: 143 mg/dL — ABNORMAL HIGH (ref 70–99)
Potassium: 3.7 mmol/L (ref 3.5–5.1)
Sodium: 134 mmol/L — ABNORMAL LOW (ref 135–145)
Total Bilirubin: 0.3 mg/dL (ref 0.3–1.2)
Total Protein: 7.2 g/dL (ref 6.5–8.1)

## 2019-05-05 NOTE — Telephone Encounter (Signed)
Reeceived call re: creatinine of 8.05 today.  Pt is a hemodialysis patient.

## 2019-05-06 ENCOUNTER — Other Ambulatory Visit: Payer: Self-pay

## 2019-05-06 ENCOUNTER — Ambulatory Visit: Payer: Medicare Other

## 2019-05-06 ENCOUNTER — Ambulatory Visit
Admission: RE | Admit: 2019-05-06 | Discharge: 2019-05-06 | Disposition: A | Discharge status: Home / Self Care | Payer: Medicare Other | Source: Ambulatory Visit | Attending: Radiation Oncology | Admitting: Radiation Oncology

## 2019-05-06 DIAGNOSIS — E876 Hypokalemia: Secondary | ICD-10-CM | POA: Diagnosis not present

## 2019-05-06 DIAGNOSIS — C3412 Malignant neoplasm of upper lobe, left bronchus or lung: Secondary | ICD-10-CM | POA: Diagnosis not present

## 2019-05-06 DIAGNOSIS — Z23 Encounter for immunization: Secondary | ICD-10-CM | POA: Diagnosis not present

## 2019-05-06 DIAGNOSIS — Z510 Encounter for antineoplastic radiation therapy: Secondary | ICD-10-CM | POA: Diagnosis not present

## 2019-05-06 DIAGNOSIS — N2581 Secondary hyperparathyroidism of renal origin: Secondary | ICD-10-CM | POA: Diagnosis not present

## 2019-05-06 DIAGNOSIS — D689 Coagulation defect, unspecified: Secondary | ICD-10-CM | POA: Diagnosis not present

## 2019-05-06 DIAGNOSIS — N186 End stage renal disease: Secondary | ICD-10-CM | POA: Diagnosis not present

## 2019-05-06 NOTE — Telephone Encounter (Signed)
LVM re next appt.

## 2019-05-07 ENCOUNTER — Ambulatory Visit: Payer: Medicare Other

## 2019-05-07 ENCOUNTER — Other Ambulatory Visit: Payer: Self-pay

## 2019-05-07 ENCOUNTER — Ambulatory Visit
Admission: RE | Admit: 2019-05-07 | Discharge: 2019-05-07 | Disposition: A | Discharge status: Home / Self Care | Payer: Medicare Other | Source: Ambulatory Visit | Attending: Radiation Oncology | Admitting: Radiation Oncology

## 2019-05-07 DIAGNOSIS — C3412 Malignant neoplasm of upper lobe, left bronchus or lung: Secondary | ICD-10-CM | POA: Diagnosis not present

## 2019-05-07 DIAGNOSIS — Z510 Encounter for antineoplastic radiation therapy: Secondary | ICD-10-CM | POA: Diagnosis not present

## 2019-05-08 ENCOUNTER — Ambulatory Visit
Admission: RE | Admit: 2019-05-08 | Discharge: 2019-05-08 | Disposition: A | Discharge status: Home / Self Care | Payer: Medicare Other | Source: Ambulatory Visit | Attending: Radiation Oncology | Admitting: Radiation Oncology

## 2019-05-08 ENCOUNTER — Ambulatory Visit: Payer: Medicare Other

## 2019-05-08 ENCOUNTER — Other Ambulatory Visit: Payer: Self-pay

## 2019-05-08 DIAGNOSIS — Z23 Encounter for immunization: Secondary | ICD-10-CM | POA: Diagnosis not present

## 2019-05-08 DIAGNOSIS — N2581 Secondary hyperparathyroidism of renal origin: Secondary | ICD-10-CM | POA: Diagnosis not present

## 2019-05-08 DIAGNOSIS — E876 Hypokalemia: Secondary | ICD-10-CM | POA: Diagnosis not present

## 2019-05-08 DIAGNOSIS — N186 End stage renal disease: Secondary | ICD-10-CM | POA: Diagnosis not present

## 2019-05-08 DIAGNOSIS — D689 Coagulation defect, unspecified: Secondary | ICD-10-CM | POA: Diagnosis not present

## 2019-05-08 DIAGNOSIS — C3412 Malignant neoplasm of upper lobe, left bronchus or lung: Secondary | ICD-10-CM | POA: Diagnosis not present

## 2019-05-08 DIAGNOSIS — Z510 Encounter for antineoplastic radiation therapy: Secondary | ICD-10-CM | POA: Diagnosis not present

## 2019-05-09 ENCOUNTER — Ambulatory Visit
Admission: RE | Admit: 2019-05-09 | Discharge: 2019-05-09 | Disposition: A | Discharge status: Home / Self Care | Payer: Medicare Other | Source: Ambulatory Visit | Attending: Radiation Oncology | Admitting: Radiation Oncology

## 2019-05-09 ENCOUNTER — Ambulatory Visit: Payer: Medicare Other

## 2019-05-09 ENCOUNTER — Other Ambulatory Visit: Payer: Self-pay

## 2019-05-09 DIAGNOSIS — Z510 Encounter for antineoplastic radiation therapy: Secondary | ICD-10-CM | POA: Diagnosis not present

## 2019-05-09 DIAGNOSIS — C3412 Malignant neoplasm of upper lobe, left bronchus or lung: Secondary | ICD-10-CM | POA: Diagnosis not present

## 2019-05-10 DIAGNOSIS — N186 End stage renal disease: Secondary | ICD-10-CM | POA: Diagnosis not present

## 2019-05-10 DIAGNOSIS — D689 Coagulation defect, unspecified: Secondary | ICD-10-CM | POA: Diagnosis not present

## 2019-05-10 DIAGNOSIS — E876 Hypokalemia: Secondary | ICD-10-CM | POA: Diagnosis not present

## 2019-05-10 DIAGNOSIS — Z23 Encounter for immunization: Secondary | ICD-10-CM | POA: Diagnosis not present

## 2019-05-10 DIAGNOSIS — N2581 Secondary hyperparathyroidism of renal origin: Secondary | ICD-10-CM | POA: Diagnosis not present

## 2019-05-12 ENCOUNTER — Telehealth: Payer: Self-pay | Admitting: Internal Medicine

## 2019-05-12 ENCOUNTER — Encounter (HOSPITAL_COMMUNITY): Payer: Self-pay

## 2019-05-12 ENCOUNTER — Telehealth: Payer: Self-pay | Admitting: *Deleted

## 2019-05-12 ENCOUNTER — Other Ambulatory Visit: Payer: Self-pay

## 2019-05-12 ENCOUNTER — Inpatient Hospital Stay: Payer: Medicare Other

## 2019-05-12 ENCOUNTER — Ambulatory Visit
Admission: RE | Admit: 2019-05-12 | Discharge: 2019-05-12 | Disposition: A | Discharge status: Home / Self Care | Payer: Medicare Other | Source: Ambulatory Visit | Attending: Radiation Oncology | Admitting: Radiation Oncology

## 2019-05-12 ENCOUNTER — Encounter: Payer: Self-pay | Admitting: Physician Assistant

## 2019-05-12 ENCOUNTER — Inpatient Hospital Stay (HOSPITAL_BASED_OUTPATIENT_CLINIC_OR_DEPARTMENT_OTHER): Payer: Medicare Other | Admitting: Physician Assistant

## 2019-05-12 VITALS — BP 151/78 | HR 75 | Temp 99.8°F | Resp 20 | Ht 69.0 in | Wt 191.8 lb

## 2019-05-12 DIAGNOSIS — Z7951 Long term (current) use of inhaled steroids: Secondary | ICD-10-CM

## 2019-05-12 DIAGNOSIS — N189 Chronic kidney disease, unspecified: Secondary | ICD-10-CM

## 2019-05-12 DIAGNOSIS — D649 Anemia, unspecified: Secondary | ICD-10-CM | POA: Diagnosis not present

## 2019-05-12 DIAGNOSIS — N183 Chronic kidney disease, stage 3 unspecified: Secondary | ICD-10-CM

## 2019-05-12 DIAGNOSIS — Z5111 Encounter for antineoplastic chemotherapy: Secondary | ICD-10-CM

## 2019-05-12 DIAGNOSIS — K219 Gastro-esophageal reflux disease without esophagitis: Secondary | ICD-10-CM | POA: Diagnosis not present

## 2019-05-12 DIAGNOSIS — C3412 Malignant neoplasm of upper lobe, left bronchus or lung: Secondary | ICD-10-CM | POA: Diagnosis not present

## 2019-05-12 DIAGNOSIS — Z7982 Long term (current) use of aspirin: Secondary | ICD-10-CM

## 2019-05-12 DIAGNOSIS — Z992 Dependence on renal dialysis: Secondary | ICD-10-CM | POA: Diagnosis not present

## 2019-05-12 DIAGNOSIS — Z79899 Other long term (current) drug therapy: Secondary | ICD-10-CM | POA: Diagnosis not present

## 2019-05-12 DIAGNOSIS — Z510 Encounter for antineoplastic radiation therapy: Secondary | ICD-10-CM | POA: Diagnosis not present

## 2019-05-12 DIAGNOSIS — E785 Hyperlipidemia, unspecified: Secondary | ICD-10-CM | POA: Diagnosis not present

## 2019-05-12 DIAGNOSIS — C3490 Malignant neoplasm of unspecified part of unspecified bronchus or lung: Secondary | ICD-10-CM

## 2019-05-12 DIAGNOSIS — C3492 Malignant neoplasm of unspecified part of left bronchus or lung: Secondary | ICD-10-CM

## 2019-05-12 DIAGNOSIS — I129 Hypertensive chronic kidney disease with stage 1 through stage 4 chronic kidney disease, or unspecified chronic kidney disease: Secondary | ICD-10-CM

## 2019-05-12 DIAGNOSIS — C3481 Malignant neoplasm of overlapping sites of right bronchus and lung: Secondary | ICD-10-CM

## 2019-05-12 LAB — CBC WITH DIFFERENTIAL (CANCER CENTER ONLY)
Abs Immature Granulocytes: 0.08 10*3/uL — ABNORMAL HIGH (ref 0.00–0.07)
Basophils Absolute: 0.1 10*3/uL (ref 0.0–0.1)
Basophils Relative: 1 %
Eosinophils Absolute: 0.3 10*3/uL (ref 0.0–0.5)
Eosinophils Relative: 3 %
HCT: 26.7 % — ABNORMAL LOW (ref 39.0–52.0)
Hemoglobin: 8.7 g/dL — ABNORMAL LOW (ref 13.0–17.0)
Immature Granulocytes: 1 %
Lymphocytes Relative: 18 %
Lymphs Abs: 1.6 10*3/uL (ref 0.7–4.0)
MCH: 29.8 pg (ref 26.0–34.0)
MCHC: 32.6 g/dL (ref 30.0–36.0)
MCV: 91.4 fL (ref 80.0–100.0)
Monocytes Absolute: 1.2 10*3/uL — ABNORMAL HIGH (ref 0.1–1.0)
Monocytes Relative: 13 %
Neutro Abs: 5.5 10*3/uL (ref 1.7–7.7)
Neutrophils Relative %: 64 %
Platelet Count: 279 10*3/uL (ref 150–400)
RBC: 2.92 MIL/uL — ABNORMAL LOW (ref 4.22–5.81)
RDW: 16.6 % — ABNORMAL HIGH (ref 11.5–15.5)
WBC Count: 8.7 10*3/uL (ref 4.0–10.5)
nRBC: 0 % (ref 0.0–0.2)

## 2019-05-12 LAB — CMP (CANCER CENTER ONLY)
ALT: 10 U/L (ref 0–44)
AST: 16 U/L (ref 15–41)
Albumin: 2.4 g/dL — ABNORMAL LOW (ref 3.5–5.0)
Alkaline Phosphatase: 99 U/L (ref 38–126)
Anion gap: 12 (ref 5–15)
BUN: 25 mg/dL — ABNORMAL HIGH (ref 8–23)
CO2: 25 mmol/L (ref 22–32)
Calcium: 8 mg/dL — ABNORMAL LOW (ref 8.9–10.3)
Chloride: 100 mmol/L (ref 98–111)
Creatinine: 7.07 mg/dL (ref 0.61–1.24)
GFR, Est AFR Am: 8 mL/min — ABNORMAL LOW (ref >60)
GFR, Estimated: 7 mL/min — ABNORMAL LOW (ref >60)
Glucose, Bld: 165 mg/dL — ABNORMAL HIGH (ref 70–99)
Potassium: 3.4 mmol/L — ABNORMAL LOW (ref 3.5–5.1)
Sodium: 137 mmol/L (ref 135–145)
Total Bilirubin: 0.3 mg/dL (ref 0.3–1.2)
Total Protein: 6.9 g/dL (ref 6.5–8.1)

## 2019-05-12 MED ORDER — DEXAMETHASONE SODIUM PHOSPHATE 10 MG/ML IJ SOLN
10.0000 mg | Freq: Once | INTRAMUSCULAR | Status: AC
  Administered 2019-05-12: 10 mg via INTRAVENOUS

## 2019-05-12 MED ORDER — SODIUM CHLORIDE 0.9 % IV SOLN
185.5000 mg | Freq: Once | INTRAVENOUS | Status: AC
  Administered 2019-05-12: 190 mg via INTRAVENOUS
  Filled 2019-05-12: qty 19

## 2019-05-12 MED ORDER — SODIUM CHLORIDE 0.9 % IV SOLN
Freq: Once | INTRAVENOUS | Status: AC
  Administered 2019-05-12: 13:00:00 via INTRAVENOUS
  Filled 2019-05-12: qty 250

## 2019-05-12 MED ORDER — SODIUM CHLORIDE 0.9 % IV SOLN
80.0000 mg/m2 | Freq: Once | INTRAVENOUS | Status: AC
  Administered 2019-05-12: 170 mg via INTRAVENOUS
  Filled 2019-05-12: qty 8.5

## 2019-05-12 MED ORDER — PALONOSETRON HCL INJECTION 0.25 MG/5ML
0.2500 mg | Freq: Once | INTRAVENOUS | Status: AC
  Administered 2019-05-12: 0.25 mg via INTRAVENOUS

## 2019-05-12 MED ORDER — DEXAMETHASONE SODIUM PHOSPHATE 10 MG/ML IJ SOLN
INTRAMUSCULAR | Status: AC
  Filled 2019-05-12: qty 1

## 2019-05-12 MED ORDER — PALONOSETRON HCL INJECTION 0.25 MG/5ML
INTRAVENOUS | Status: AC
  Filled 2019-05-12: qty 5

## 2019-05-12 NOTE — Progress Notes (Signed)
Per Dr. Julien Nordmann: okay to treat patient with current Scr. level

## 2019-05-12 NOTE — Progress Notes (Signed)
[facility] OFFICE PROGRESS NOTE  [name], MD [address] [place] [address]  DIAGNOSIS: Limited stage (T2b, N1, M0) small cell lung cancer presented with right upper lobe with invasion of superior segment of right lower lobe as well as right hilar lymphadenopathy diagnosed in March 2020.  PRIOR THERAPY: None.  CURRENT THERAPY: Systemic chemotherapy with carboplatin for AUC of 5 on day 1 and etoposide 100 mg/M2 on days 1, 2 and 3 every 3 weeks.  Status post 2 cycles.  INTERVAL HISTORY: Jeremy Johnson 73 y.o. male returns to the clinic for a follow-up visit.  On routine labs the patient's creatinine elevated to 9.5. He also had anemia with a hemoglobin of 6.9.  He was subsequently sent to the emergency room and admitted to [facility] for worsening kidney function.  While admitted, he underwent kidney biopsy which showed anti-GBM. The patient is currently undergoing hemodialysis on Tuesdays, Thursdays, and Saturdays. He is doing well with dialysis except for experiencing fatigue after dialysis.   Today the patient is feeling well without any concerning complaints.  He denies any fever, chills, or night sweats.  He denies any chest pain, cough, or hemoptysis. He reports his baseline shortness of breath with exertion. He denies any nausea, vomiting, diarrhea, or constipation.  He denies any headache or visual changes. He is here today for evaluation before starting cycle #4.   MEDICAL HISTORY: Past Medical History:  Diagnosis Date  . Anemia   . Blood transfusion without reported diagnosis   . CAD (coronary artery disease)    STENT... MID CIRCUMFLEX...1997  . Cancer ([place])   . Chronic kidney disease    STAGE 3  . Degenerative joint disease (DJD) of lumbar spine   . GERD (gastroesophageal reflux disease)   . Gout   . Hyperlipidemia   . Hypertension   . Hypothyroidism   . Incisional hernia   . Leukocytosis    CHRONIC MILD  . Myocardial infarction ([place])     1997    ALLERGIES:  is allergic to penicillins.  MEDICATIONS:  Current Outpatient Medications  Medication Sig Dispense Refill  . allopurinol (ZYLOPRIM) 100 MG tablet Take 1 tablet (100 mg total) by mouth daily. 30 tablet 1  . amLODipine (NORVASC) 5 MG tablet Take 5 mg by mouth daily.     [name] aspirin EC 81 MG tablet Take 81 mg by mouth daily.    . Darbepoetin Alfa (ARANESP) 100 MCG/0.5ML SOSY injection Inject 0.5 mLs (100 mcg total) into the vein every Thursday with hemodialysis. 4.2 mL   . levothyroxine (SYNTHROID, LEVOTHROID) 175 MCG tablet Take 175 mcg by mouth daily before breakfast.    . metoprolol tartrate (LOPRESSOR) 50 MG tablet Take 25 mg by mouth 2 (two) times daily.     . mometasone (ELOCON) 0.1 % ointment Apply 1 application topically 2 (two) times daily as needed (for eczema).   2  . omeprazole (PRILOSEC) 20 MG capsule Take 20 mg by mouth daily.    . sevelamer carbonate (RENVELA) 800 MG tablet Take 2 tablets (1,600 mg total) by mouth 3 (three) times daily with meals. 180 tablet 2  . simvastatin (ZOCOR) 20 MG tablet Take 20 mg by mouth daily.     . sucralfate (CARAFATE) 1 g tablet Take 1 tablet (1 g total) by mouth 4 (four) times daily -  with meals and at bedtime. 5 min before meals for radiation induced esophagitis 120 tablet 2  . prochlorperazine (COMPAZINE) 10 MG  tablet Take 1 tablet (10 mg total) by mouth every 6 (six) hours as needed for nausea or vomiting. (Patient not taking: Reported on 05/12/2019) 30 tablet 0   No current facility-administered medications for this visit.     SURGICAL HISTORY:  Past Surgical History:  Procedure Laterality Date  . ABDOMINAL AORTIC ANEURYSM REPAIR  2006  . AV FISTULA PLACEMENT Left 04/25/2019   Procedure: ARTERIOVENOUS (AV) FISTULA CREATION LEFT ARM;  Surgeon: Angelia Mould, MD;  Location: [facility];  Service: Vascular;  Laterality: Left;  . BIOPSY  09/30/2018   Procedure: BIOPSY;  Surgeon: Danie Binder, MD;  [facility];  Service: Endoscopy;;  ascending colon  . COLONOSCOPY  2008  . COLONOSCOPY N/A 09/30/2018   Procedure: COLONOSCOPY;  Surgeon: Danie Binder, MD;  [facility];  Service: Endoscopy;  Laterality: N/A;  9:00  . CORONARY ANGIOPLASTY WITH STENT PLACEMENT  2008   MID CIRCUMFLEX  . IR FLUORO GUIDE CV LINE RIGHT  04/22/2019  . IR US GUIDE VASC ACCESS RIGHT  04/22/2019  . POLYPECTOMY  09/30/2018   Procedure: POLYPECTOMY;  Surgeon: Danie Binder, MD;  [facility];  Service: Endoscopy;;  colon  . VIDEO BRONCHOSCOPY WITH ENDOBRONCHIAL NAVIGATION N/A 01/27/2019   Procedure: VIDEO BRONCHOSCOPY WITH ENDOBRONCHIAL NAVIGATION;  Surgeon: Grace Isaac, MD;  Location: [facility];  Service: Thoracic;  Laterality: N/A;  . VIDEO BRONCHOSCOPY WITH ENDOBRONCHIAL ULTRASOUND N/A 01/27/2019   Procedure: VIDEO BRONCHOSCOPY WITH ENDOBRONCHIAL ULTRASOUND;  Surgeon: Grace Isaac, MD;  Location: [facility];  Service: Thoracic;  Laterality: N/A;    REVIEW OF SYSTEMS:   Review of Systems  Constitutional: Negative for appetite change, chills, fever and unexpected weight change.  HENT:   Negative for mouth sores, nosebleeds, sore throat and trouble swallowing.   Eyes: Negative for eye problems and icterus.  Respiratory: Positive for baseline shortness of breath. Negative for cough, hemoptysis, and wheezing.   Cardiovascular: Negative for chest pain and leg swelling.  Gastrointestinal: Negative for abdominal pain, constipation, diarrhea, nausea and vomiting.  Genitourinary: Negative for bladder incontinence, difficulty urinating, dysuria, frequency and hematuria.   Musculoskeletal: Negative for back pain, gait problem, neck pain and neck stiffness.  Skin: Negative for itching and rash.  Neurological: Negative for dizziness, extremity weakness, gait problem, headaches, light-headedness and seizures.  Hematological: Negative for adenopathy. Does not bruise/bleed easily.  Psychiatric/Behavioral:  Negative for confusion, depression and sleep disturbance. The patient is not nervous/anxious.     PHYSICAL EXAMINATION:  Blood pressure (!) 151/78, pulse 75, temperature 99.8 F (37.7 C), temperature source Oral, resp. rate 20, height 5\' 9"  (1.753 m), weight 191 lb 12.8 oz (87 kg), SpO2 100 %.  ECOG PERFORMANCE STATUS: 1 - Symptomatic but completely ambulatory  Physical Exam  Constitutional: Oriented to person, place, and time and well-developed, well-nourished, and in no distress.  HENT:  Head: Normocephalic and atraumatic.  Mouth/Throat: Oropharynx is clear and moist. No oropharyngeal exudate.  Eyes: Conjunctivae are normal. Right eye exhibits no discharge. Left eye exhibits no discharge. No scleral icterus.  Neck: Normal range of motion. Neck supple.  Cardiovascular: Normal rate, regular rhythm, normal heart sounds and intact distal pulses.   Pulmonary/Chest: Effort normal and breath sounds normal. No respiratory distress. No wheezes. No rales.  Abdominal: Soft. Bowel sounds are normal. Exhibits no distension and no mass. There is no tenderness.  Musculoskeletal: Normal range of motion. Exhibits no edema.  Lymphadenopathy:    No cervical adenopathy.  Neurological: Alert and oriented to person, place, and time. Exhibits normal muscle tone. Gait normal. Coordination normal.  Skin: Skin is warm and dry. No rash noted. Not diaphoretic. No erythema. No pallor.  Psychiatric: Mood, memory and judgment normal.  Vitals reviewed.  LABORATORY DATA: Lab Results  Component Value Date   WBC 8.7 05/12/2019   HGB 8.7 (L) 05/12/2019   HCT 26.7 (L) 05/12/2019   MCV 91.4 05/12/2019   PLT 279 05/12/2019      Chemistry      Component Value Date/Time   NA 137 05/12/2019 0953   K 3.4 (L) 05/12/2019 0953   CL 100 05/12/2019 0953   CO2 25 05/12/2019 0953   BUN 25 (H) 05/12/2019 0953   CREATININE 7.07 (HH) 05/12/2019 0953      Component Value Date/Time   CALCIUM 8.0 (L) 05/12/2019 0953    ALKPHOS 99 05/12/2019 0953   AST 16 05/12/2019 0953   ALT 10 05/12/2019 0953   BILITOT 0.3 05/12/2019 0953       RADIOGRAPHIC STUDIES:  Ct Abdomen Pelvis Wo Contrast  Result Date: 04/17/2019 CLINICAL DATA:  Inpatient. Gross hematuria with passage of clots. Acute renal failure. History of small cell lung cancer diagnosed March 2020 treated with chemotherapy. EXAM: CT ABDOMEN AND PELVIS WITHOUT CONTRAST TECHNIQUE: Multidetector CT imaging of the abdomen and pelvis was performed following the standard protocol without IV contrast. COMPARISON:  01/22/2019 PET-CT. FINDINGS: Lower chest: Trace dependent bilateral pleural effusions with mild dependent bibasilar atelectasis. Hepatobiliary: Normal liver size. No liver mass. Normal gallbladder with no radiopaque cholelithiasis. No biliary ductal dilatation. Pancreas: No pancreatic mass or duct dilation. There is mild haziness of the peripancreatic fat. There is new fat stranding and ill-defined fluid throughout the anterior paranephric spaces bilaterally extending into the pelvis. Spleen: Normal size. No mass. Adrenals/Urinary Tract: Normal adrenals. No hydronephrosis. No renal stones. Normal caliber ureters. No ureteral stones. Several simple renal cysts scattered in the right kidney, largest 7.2 cm in posterior lower right kidney. No contour deforming left renal masses. Normal bladder. Stomach/Bowel: Small hiatal hernia. Otherwise normal nondistended stomach. Normal caliber small bowel with no small bowel wall thickening. Appendix not discretely visualized. Oral contrast transits to left colon. Partial visualization of a midline supraumbilical ventral hernia containing a small portion of the transverse colon, with no evidence of associated colonic wall thickening, pneumatosis or caliber transition. Small left inguinal hernia contains a small portion of the proximal sigmoid colon, with no associated colonic wall thickening, pneumatosis or caliber transition. No  large bowel wall thickening, significant diverticulosis or significant pericolonic fat stranding. Vascular/Lymphatic: Atherosclerotic abdominal aorta with 3.2 cm infrarenal abdominal aortic aneurysm. No pathologically enlarged lymph nodes in the abdomen or pelvis. Reproductive: Top-normal size prostate. Other: No pneumoperitoneum, ascites or focal fluid collection. Musculoskeletal: No aggressive appearing focal osseous lesions. Moderate thoracolumbar spondylosis. IMPRESSION: 1. No urolithiasis. No hydronephrosis. No acute bladder abnormality. 2. Mild haziness of the peripancreatic fat. New fat stranding and ill-defined fluid throughout the anterior paranephric retroperitoneal spaces bilaterally extending into the pelvis. Acute pancreatitis cannot be excluded. Suggest correlation with serum lipase. No biliary or pancreatic duct dilation on this noncontrast scan. 3. Trace dependent bilateral pleural effusions. 4. Midline supraumbilical ventral hernia contains a portion of the transverse colon. Left inguinal hernia contains a portion of the sigmoid colon. No acute bowel complication. 5. Small hiatal hernia. 6. Infrarenal 3.2 cm Abdominal Aortic Aneurysm (ICD10-I71.9). Recommend follow-up aortic ultrasound in 3 years. This recommendation follows ACR consensus guidelines: White Paper  of the ACR Incidental Findings Committee II on Vascular Findings. J Am Coll Radiol 2013; 10:789-794. 7.  Aortic Atherosclerosis (ICD10-I70.0). Electronically Signed   By: Ilona Sorrel M.D.   On: 04/17/2019 16:09   Dg Chest 2 View  Result Date: 04/14/2019 CLINICAL DATA:  Anemia.  History of lung cancer. EXAM: CHEST - 2 VIEW COMPARISON:  Chest x-ray 01/24/2019 and chest CT 03/24/2019 FINDINGS: The cardiac silhouette, mediastinal and hilar contours are within normal limits and stable. There is mild tortuosity of the thoracic aorta. Mild chronic emphysematous changes with areas of pulmonary scarring. No infiltrates, edema or effusions. No  worrisome pulmonary lesions. IMPRESSION: Chronic emphysematous changes and pulmonary scarring but no definite acute overlying pulmonary process or new pulmonary lesions. Electronically Signed   By: Marijo Sanes M.D.   On: 04/14/2019 13:20   US Renal  Result Date: 04/15/2019 CLINICAL DATA:  Acute renal disease.  Chronic renal disease. EXAM: RENAL / URINARY TRACT ULTRASOUND COMPLETE COMPARISON:  PET-CT 01/22/2019. FINDINGS: Right Kidney: Renal measurements: 12.8 x 6.0 x 6.1 cm = volume: 242.3 mL. Increased echogenicity. 8.8 cm simple cyst. 3.6 cm simple cyst. No hydronephrosis visualized. Left Kidney: Renal measurements: 12.9 x 6.1 x 5.3 cm = volume: 219.5 mL. Increased echogenicity. No mass or hydronephrosis visualized. Bladder: Appears normal for degree of bladder distention. IMPRESSION: Increased echogenicity both kidneys. This consistent chronic medical renal disease. Two simple cyst right kidney. No evidence of hydronephrosis or bladder distention. No acute abnormality identified. Electronically Signed   By: Marcello Moores  Register   On: 04/15/2019 11:33   Ir Fluoro Guide Cv Line Right  Result Date: 04/22/2019 INDICATION: Acute kidney injury, access for dialysis EXAM: ULTRASOUND GUIDANCE FOR VASCULAR ACCESS RIGHT INTERNAL JUGULAR PERMANENT HEMODIALYSIS CATHETER Date:  04/22/2019 04/22/2019 1:31 pm Radiologist:  Jerilynn Mages. Daryll Brod, MD Guidance:  Ultrasound and fluoroscopic FLUOROSCOPY TIME:  Fluoroscopy Time: 0 minutes 32 seconds (4 mGy). MEDICATIONS: 1 g vancomycin within hour of the procedure ANESTHESIA/SEDATION: Versed 0 mg IV; Fentanyl 50 mcg IV; Moderate Sedation Time:  None. The patient was continuously monitored during the procedure by the interventional radiology nurse under my direct supervision. CONTRAST:  None. COMPLICATIONS: None immediate. PROCEDURE: Informed consent was obtained from the patient following explanation of the procedure, risks, benefits and alternatives. The patient understands, agrees and  consents for the procedure. All questions were addressed. A time out was performed. Maximal barrier sterile technique utilized including caps, mask, sterile gowns, sterile gloves, large sterile drape, hand hygiene, and 2% chlorhexidine scrub. Under sterile conditions and local anesthesia, right internal jugular micropuncture venous access was performed with ultrasound. Images were obtained for documentation of the patent right internal jugular vein. A guide wire was inserted followed by a transitional dilator. Next, a 0.035 guidewire was advanced into the IVC with a 5-French catheter. Measurements were obtained from the right venotomy site to the proximal right atrium. In the right infraclavicular chest, a subcutaneous tunnel was created under sterile conditions and local anesthesia. 1% lidocaine with epinephrine was utilized for this. The 23 cm tip to cuff palindrome catheter was tunneled subcutaneously to the venotomy site and inserted into the SVC/RA junction through a valved peel-away sheath. Position was confirmed with fluoroscopy. Images were obtained for documentation. Blood was aspirated from the catheter followed by saline and heparin flushes. The appropriate volume and strength of heparin was instilled in each lumen. Caps were applied. The catheter was secured at the tunnel site with Gelfoam and a pursestring suture. The venotomy site was closed with  subcuticular Vicryl suture. Dermabond was applied to the small right neck incision. A dry sterile dressing was applied. The catheter is ready for use. No immediate complications. IMPRESSION: Ultrasound and fluoroscopically guided right internal jugular tunneled hemodialysis catheter (23 cm tip to cuff palindrome catheter). Electronically Signed   By: Jerilynn Mages.  Shick M.D.   On: 04/22/2019 13:36   Ir US Guide Vasc Access Right  Result Date: 04/22/2019 INDICATION: Acute kidney injury, access for dialysis EXAM: ULTRASOUND GUIDANCE FOR VASCULAR ACCESS RIGHT INTERNAL  JUGULAR PERMANENT HEMODIALYSIS CATHETER Date:  04/22/2019 04/22/2019 1:31 pm Radiologist:  Jerilynn Mages. Daryll Brod, MD Guidance:  Ultrasound and fluoroscopic FLUOROSCOPY TIME:  Fluoroscopy Time: 0 minutes 32 seconds (4 mGy). MEDICATIONS: 1 g vancomycin within hour of the procedure ANESTHESIA/SEDATION: Versed 0 mg IV; Fentanyl 50 mcg IV; Moderate Sedation Time:  None. The patient was continuously monitored during the procedure by the interventional radiology nurse under my direct supervision. CONTRAST:  None. COMPLICATIONS: None immediate. PROCEDURE: Informed consent was obtained from the patient following explanation of the procedure, risks, benefits and alternatives. The patient understands, agrees and consents for the procedure. All questions were addressed. A time out was performed. Maximal barrier sterile technique utilized including caps, mask, sterile gowns, sterile gloves, large sterile drape, hand hygiene, and 2% chlorhexidine scrub. Under sterile conditions and local anesthesia, right internal jugular micropuncture venous access was performed with ultrasound. Images were obtained for documentation of the patent right internal jugular vein. A guide wire was inserted followed by a transitional dilator. Next, a 0.035 guidewire was advanced into the IVC with a 5-French catheter. Measurements were obtained from the right venotomy site to the proximal right atrium. In the right infraclavicular chest, a subcutaneous tunnel was created under sterile conditions and local anesthesia. 1% lidocaine with epinephrine was utilized for this. The 23 cm tip to cuff palindrome catheter was tunneled subcutaneously to the venotomy site and inserted into the SVC/RA junction through a valved peel-away sheath. Position was confirmed with fluoroscopy. Images were obtained for documentation. Blood was aspirated from the catheter followed by saline and heparin flushes. The appropriate volume and strength of heparin was instilled in each lumen.  Caps were applied. The catheter was secured at the tunnel site with Gelfoam and a pursestring suture. The venotomy site was closed with subcuticular Vicryl suture. Dermabond was applied to the small right neck incision. A dry sterile dressing was applied. The catheter is ready for use. No immediate complications. IMPRESSION: Ultrasound and fluoroscopically guided right internal jugular tunneled hemodialysis catheter (23 cm tip to cuff palindrome catheter). Electronically Signed   By: Jerilynn Mages.  Shick M.D.   On: 04/22/2019 13:36   US Biopsy (kidney)  Result Date: 04/21/2019 INDICATION: Acute kidney injury EXAM: ULTRASOUND GUIDED CORE BIOPSY OF LEFT KIDNEY MEDICATIONS: 1% LIDOCAINE LOCAL ANESTHESIA/SEDATION: Versed 2.0mg  IV; Fentanyl 53mcg IV; Moderate Sedation Time:  11 minutes The patient was continuously monitored during the procedure by the interventional radiology nurse under my direct supervision. FLUOROSCOPY TIME:  Fluoroscopy Time: None. COMPLICATIONS: None immediate. PROCEDURE: The procedure, risks, benefits, and alternatives were explained to the patient. Questions regarding the procedure were encouraged and answered. The patient understands and consents to the procedure. The left flank was prepped with ChloraPrep in a sterile fashion, and a sterile drape was applied covering the operative field. A sterile gown and sterile gloves were used for the procedure. Local anesthesia was provided with 1% Lidocaine. Patient positioned prone. Preliminary ultrasound performed. The left kidney lower pole was localized. Overlying skin marked. Under  sterile conditions and local anesthesia, a 15 gauge coaxial guide needle was advanced to the left kidney lower pole. Needle position confirmed with ultrasound. 2 16 gauge core biopsies obtained of the cortex. Needle position confirmed with ultrasound. Images obtained for documentation. Needle tract occluded with Gel-Foam. Patient tolerated the procedure well. FINDINGS: Imaging  confirms needle placed into the left kidney lower pole cortex for core biopsy IMPRESSION: Successful ultrasound left renal 16 gauge core biopsy Electronically Signed   By: Jerilynn Mages.  Shick M.D.   On: 04/21/2019 09:41   Vas [place] Upper Ext Vein Mapping (pre-op Avf)  Result Date: 04/24/2019 UPPER EXTREMITY VEIN MAPPING  Indications: Pre-access. Comparison Study: No prior studies.  Examination Guidelines: A complete evaluation includes B-mode imaging, spectral Doppler, color Doppler, and power Doppler as needed of all accessible portions of each vessel. Bilateral testing is considered an integral part of a complete examination. Limited examinations for reoccurring indications may be performed as noted. +-----------------+-------------+----------+---------+ Right Cephalic   Diameter (cm)Depth (cm)Findings  +-----------------+-------------+----------+---------+ Shoulder             0.48        0.83             +-----------------+-------------+----------+---------+ Prox upper arm       0.40        0.93             +-----------------+-------------+----------+---------+ Mid upper arm        0.38        0.39             +-----------------+-------------+----------+---------+ Dist upper arm       0.32        0.38             +-----------------+-------------+----------+---------+ Antecubital fossa    0.33        0.58   branching +-----------------+-------------+----------+---------+ Prox forearm         0.27        0.52             +-----------------+-------------+----------+---------+ Mid forearm          0.22        0.48             +-----------------+-------------+----------+---------+ Dist forearm         0.25        0.31             +-----------------+-------------+----------+---------+ +-----------------+-------------+----------+---------+ Left Cephalic    Diameter (cm)Depth (cm)Findings  +-----------------+-------------+----------+---------+ Shoulder             0.42         1.08             +-----------------+-------------+----------+---------+ Prox upper arm       0.35        1.14             +-----------------+-------------+----------+---------+ Mid upper arm        0.32        0.40   branching +-----------------+-------------+----------+---------+ Dist upper arm       0.44        0.38             +-----------------+-------------+----------+---------+ Antecubital fossa    0.43        0.40   branching +-----------------+-------------+----------+---------+ Prox forearm         0.38        0.48   branching +-----------------+-------------+----------+---------+ Mid forearm  0.29        0.45             +-----------------+-------------+----------+---------+ Dist forearm         0.22        0.27             +-----------------+-------------+----------+---------+ *See table(s) above for measurements and observations.  Diagnosing physician: Servando Snare MD Electronically signed by Servando Snare MD on 04/24/2019 at 5:32:07 PM.    Final      ASSESSMENT/PLAN:  This is a very pleasant 73 year old Caucasian male with limited stage small cell lung cancer.  He presented with a right upper lobe mass with invasion of the superior segment of the right lower lobe as well as right hilar lymphadenopathy. He was diagnosed in March 2020.   He is currently undergoing treatment with carboplatin for an AUC of 5 on day 1 and etoposide 80 mg/m2 days 1, 2, and 3 every 3 weeks.  He is status post 3 cycles. He tolerated treatment well without any adverse effects. The patient was recently hospitalized for worsening kidney function and is currently undergoing dialysis Tuesday, Thursday, and saturday in [place].   The patient was seen with Dr. Julien Nordmann today. Her lengthy discussion with the patient today about his current condition. Dr. Julien Nordmann recommends that the patient proceed with one more cycle, cycle #4, of chemotherapy today. We will re-evaluate his  disease with a restaging CT scan of his chest in 3 weeks.   I will see him back for a follow up visit in 3 weeks for evaluation and to review his scan results.   We will coordinate with his dialysis center to have him receive dialysis in the morning and his chemotherapy/radiation in the afternoon.   The patient was advised to call immediately if she has any concerning symptoms in the interval. The patient voices understanding of current disease status and treatment options and is in agreement with the current care plan. All questions were answered. The patient knows to call the clinic with any problems, questions or concerns. We can certainly see the patient much sooner if necessary   Orders Placed This Encounter  Procedures  . CT Chest W Contrast    Standing Status:   Future    Standing Expiration Date:   05/11/2020    Order Specific Question:   ** REASON FOR EXAM (FREE TEXT)    Answer:   Restaging Lung Cancer    Order Specific Question:   If indicated for the ordered procedure, I authorize the administration of contrast media per Radiology protocol    Answer:   Yes    Order Specific Question:   Preferred imaging location?    Answer:   [facility]    Order Specific Question:   Radiology Contrast Protocol - do NOT remove file path    Answer:   [email or web address]     Nathaniel Wakeley L Dravon Nott, PA-C 05/12/19   ADDENDUM: Hematology/Oncology Attending: I had a face-to-face encounter with the patient today.  I recommended his care plan.  This is a very pleasant 73 years old white male with limited stage small cell lung cancer diagnosed in March 2020 and currently undergoing systemic chemotherapy with carboplatin and etoposide concurrent with radiation.  The patient also has a history of chronic renal insufficiency with acute renal failure recently.  He had significant elevation of his serum creatinine and the patient was restarted on hemodialysis in  [place]. I recommended for the patient to proceed with  cycle #4 of his treatment today as planned. We will work with the hemodialysis team to reschedule his treatment anyway that he can receive his systemic chemotherapy and stay in his system for at least 1 day before the dialysis. We will see him back for follow-up visit in 3 weeks for evaluation with repeat CT scan of the chest for restaging of his disease. The patient was advised to call immediately if he has any concerning symptoms in the interval.  Disclaimer: This note was dictated with voice recognition software. Similar sounding words can inadvertently be transcribed and may be missed upon review. Eilleen Kempf, MD 05/12/19

## 2019-05-12 NOTE — Patient Instructions (Signed)
[facility] Discharge Instructions for Patients Receiving Chemotherapy  Today you received the following chemotherapy agents Etoposide (VEPESID) & Carboplatin (PARAPLATIN).  To help prevent nausea and vomiting after your treatment, we encourage you to take your nausea medication as prescribed.   If you develop nausea and vomiting that is not controlled by your nausea medication, call the clinic.   BELOW ARE SYMPTOMS THAT SHOULD BE REPORTED IMMEDIATELY:  *FEVER GREATER THAN 100.5 F  *CHILLS WITH OR WITHOUT FEVER  NAUSEA AND VOMITING THAT IS NOT CONTROLLED WITH YOUR NAUSEA MEDICATION  *UNUSUAL SHORTNESS OF BREATH  *UNUSUAL BRUISING OR BLEEDING  TENDERNESS IN MOUTH AND THROAT WITH OR WITHOUT PRESENCE OF ULCERS  *URINARY PROBLEMS  *BOWEL PROBLEMS  UNUSUAL RASH Items with * indicate a potential emergency and should be followed up as soon as possible.  Feel free to call the clinic should you have any questions or concerns. The clinic phone number is [phone].  Please show the [place] at check-in to the Emergency Department and triage nurse.  Coronavirus (COVID-19) Are you at risk?  Are you at risk for the Coronavirus (COVID-19)?  To be considered HIGH RISK for Coronavirus (COVID-19), you have to meet the following criteria:  . Traveled to [place], [place], [place], [place] or [place]; or in the [place] to [place], [place], [place], or [place]; and have fever, cough, and shortness of breath within the last 2 weeks of travel OR . Been in close contact with a person diagnosed with COVID-19 within the last 2 weeks and have fever, cough, and shortness of breath . IF YOU DO NOT MEET THESE CRITERIA, YOU ARE CONSIDERED LOW RISK FOR COVID-19.  What to do if you are HIGH RISK for COVID-19?  [name] If you are having a medical emergency, call 911. . Seek medical care right away. Before you go to a doctor's office, urgent care or emergency department,  call ahead and tell them about your recent travel, contact with someone diagnosed with COVID-19, and your symptoms. You should receive instructions from your physician's office regarding next steps of care.  . When you arrive at healthcare provider, tell the healthcare staff immediately you have returned from visiting [place], [place], [place], [place] or [place]; or traveled in the [place] to [place], [place], [place], or [place]; in the last two weeks or you have been in close contact with a person diagnosed with COVID-19 in the last 2 weeks.   . Tell the health care staff about your symptoms: fever, cough and shortness of breath. . After you have been seen by a medical provider, you will be either: o Tested for (COVID-19) and discharged home on quarantine except to seek medical care if symptoms worsen, and asked to  - Stay home and avoid contact with others until you get your results (4-5 days)  - Avoid travel on public transportation if possible (such as bus, train, or airplane) or o Sent to the Emergency Department by EMS for evaluation, COVID-19 testing, and possible admission depending on your condition and test results.  What to do if you are LOW RISK for COVID-19?  Reduce your risk of any infection by using the same precautions used for avoiding the common cold or flu:  [name] Wash your hands often with soap and warm water for at least 20 seconds.  If soap and water are not readily available, use an alcohol-based hand sanitizer with at least 60% alcohol.  [name]  If coughing or sneezing, cover your mouth and nose by coughing or sneezing into the elbow areas of your shirt or coat, into a tissue or into your sleeve (not your hands). . Avoid shaking hands with others and consider head nods or verbal greetings only. . Avoid touching your eyes, nose, or mouth with unwashed hands.  . Avoid close contact with people who are sick. . Avoid places or events with large numbers of people in one  location, like concerts or sporting events. . Carefully consider travel plans you have or are making. . If you are planning any travel outside or inside the [place], visit the CDC's Travelers' Health webpage for the latest health notices. . If you have some symptoms but not all symptoms, continue to monitor at home and seek medical attention if your symptoms worsen. . If you are having a medical emergency, call 911.   [place] / e-Visit: [email or web address]         [facility]: [facility]: [phone]                   [facility]: [phone]

## 2019-05-12 NOTE — Telephone Encounter (Signed)
Wife aware of appts added per 6/29 sch message and pt to get an updated schedule tomorrow

## 2019-05-12 NOTE — Telephone Encounter (Signed)
Received report from lab that pt has elevated creat = 7.07.  This is slightly better than last week. Pt is being seen today.  Message left on Dr [name]/RN vm & will also route & send via CHAT.

## 2019-05-13 ENCOUNTER — Ambulatory Visit: Payer: Medicare Other

## 2019-05-13 ENCOUNTER — Other Ambulatory Visit: Payer: Self-pay

## 2019-05-13 ENCOUNTER — Ambulatory Visit
Admission: RE | Admit: 2019-05-13 | Discharge: 2019-05-13 | Disposition: A | Discharge status: Home / Self Care | Payer: Medicare Other | Source: Ambulatory Visit | Attending: Radiation Oncology | Admitting: Radiation Oncology

## 2019-05-13 ENCOUNTER — Inpatient Hospital Stay: Payer: Medicare Other

## 2019-05-13 VITALS — BP 120/81 | HR 78 | Temp 98.9°F | Resp 18

## 2019-05-13 DIAGNOSIS — Z510 Encounter for antineoplastic radiation therapy: Secondary | ICD-10-CM | POA: Diagnosis not present

## 2019-05-13 DIAGNOSIS — Z5111 Encounter for antineoplastic chemotherapy: Secondary | ICD-10-CM | POA: Diagnosis not present

## 2019-05-13 DIAGNOSIS — Z79899 Other long term (current) drug therapy: Secondary | ICD-10-CM | POA: Diagnosis not present

## 2019-05-13 DIAGNOSIS — D689 Coagulation defect, unspecified: Secondary | ICD-10-CM | POA: Diagnosis not present

## 2019-05-13 DIAGNOSIS — E876 Hypokalemia: Secondary | ICD-10-CM | POA: Diagnosis not present

## 2019-05-13 DIAGNOSIS — K219 Gastro-esophageal reflux disease without esophagitis: Secondary | ICD-10-CM | POA: Diagnosis not present

## 2019-05-13 DIAGNOSIS — E785 Hyperlipidemia, unspecified: Secondary | ICD-10-CM | POA: Diagnosis not present

## 2019-05-13 DIAGNOSIS — I129 Hypertensive chronic kidney disease with stage 1 through stage 4 chronic kidney disease, or unspecified chronic kidney disease: Secondary | ICD-10-CM | POA: Diagnosis not present

## 2019-05-13 DIAGNOSIS — Z7982 Long term (current) use of aspirin: Secondary | ICD-10-CM | POA: Diagnosis not present

## 2019-05-13 DIAGNOSIS — Z992 Dependence on renal dialysis: Secondary | ICD-10-CM | POA: Diagnosis not present

## 2019-05-13 DIAGNOSIS — Z7951 Long term (current) use of inhaled steroids: Secondary | ICD-10-CM | POA: Diagnosis not present

## 2019-05-13 DIAGNOSIS — C3490 Malignant neoplasm of unspecified part of unspecified bronchus or lung: Secondary | ICD-10-CM

## 2019-05-13 DIAGNOSIS — C3481 Malignant neoplasm of overlapping sites of right bronchus and lung: Secondary | ICD-10-CM | POA: Diagnosis not present

## 2019-05-13 DIAGNOSIS — C3412 Malignant neoplasm of upper lobe, left bronchus or lung: Secondary | ICD-10-CM | POA: Diagnosis not present

## 2019-05-13 DIAGNOSIS — N2581 Secondary hyperparathyroidism of renal origin: Secondary | ICD-10-CM | POA: Diagnosis not present

## 2019-05-13 DIAGNOSIS — Z23 Encounter for immunization: Secondary | ICD-10-CM | POA: Diagnosis not present

## 2019-05-13 DIAGNOSIS — N186 End stage renal disease: Secondary | ICD-10-CM | POA: Diagnosis not present

## 2019-05-13 DIAGNOSIS — D649 Anemia, unspecified: Secondary | ICD-10-CM | POA: Diagnosis not present

## 2019-05-13 DIAGNOSIS — N189 Chronic kidney disease, unspecified: Secondary | ICD-10-CM | POA: Diagnosis not present

## 2019-05-13 MED ORDER — SODIUM CHLORIDE 0.9 % IV SOLN
80.0000 mg/m2 | Freq: Once | INTRAVENOUS | Status: AC
  Administered 2019-05-13: 170 mg via INTRAVENOUS
  Filled 2019-05-13: qty 8.5

## 2019-05-13 MED ORDER — DEXAMETHASONE SODIUM PHOSPHATE 10 MG/ML IJ SOLN
10.0000 mg | Freq: Once | INTRAMUSCULAR | Status: AC
  Administered 2019-05-13: 10 mg via INTRAVENOUS

## 2019-05-13 MED ORDER — SODIUM CHLORIDE 0.9 % IV SOLN
Freq: Once | INTRAVENOUS | Status: AC
  Administered 2019-05-13: 16:00:00 via INTRAVENOUS
  Filled 2019-05-13: qty 250

## 2019-05-13 MED ORDER — DEXAMETHASONE SODIUM PHOSPHATE 10 MG/ML IJ SOLN
INTRAMUSCULAR | Status: AC
  Filled 2019-05-13: qty 1

## 2019-05-13 NOTE — Patient Instructions (Signed)
[facility] Discharge Instructions for Patients Receiving Chemotherapy  Today you received the following chemotherapy agents: Etoposide (VEPESID).  To help prevent nausea and vomiting after your treatment, we encourage you to take your nausea medication as prescribed.   If you develop nausea and vomiting that is not controlled by your nausea medication, call the clinic.   BELOW ARE SYMPTOMS THAT SHOULD BE REPORTED IMMEDIATELY:  *FEVER GREATER THAN 100.5 F  *CHILLS WITH OR WITHOUT FEVER  NAUSEA AND VOMITING THAT IS NOT CONTROLLED WITH YOUR NAUSEA MEDICATION  *UNUSUAL SHORTNESS OF BREATH  *UNUSUAL BRUISING OR BLEEDING  TENDERNESS IN MOUTH AND THROAT WITH OR WITHOUT PRESENCE OF ULCERS  *URINARY PROBLEMS  *BOWEL PROBLEMS  UNUSUAL RASH Items with * indicate a potential emergency and should be followed up as soon as possible.  Feel free to call the clinic should you have any questions or concerns. The clinic phone number is [phone].  Please show the [facility] ALERT CARD at check-in to the Emergency Department and triage nurse.   

## 2019-05-14 ENCOUNTER — Ambulatory Visit (HOSPITAL_COMMUNITY)
Admission: RE | Admit: 2019-05-14 | Discharge: 2019-05-14 | Disposition: A | Discharge status: Home / Self Care | Payer: Medicare Other | Source: Ambulatory Visit | Attending: Family | Admitting: Family

## 2019-05-14 ENCOUNTER — Inpatient Hospital Stay: Payer: Medicare Other | Attending: Internal Medicine

## 2019-05-14 ENCOUNTER — Encounter: Payer: Self-pay | Admitting: Family

## 2019-05-14 ENCOUNTER — Other Ambulatory Visit: Payer: Self-pay | Admitting: *Deleted

## 2019-05-14 ENCOUNTER — Inpatient Hospital Stay: Payer: Medicare Other

## 2019-05-14 ENCOUNTER — Other Ambulatory Visit: Payer: Self-pay

## 2019-05-14 ENCOUNTER — Encounter: Payer: Self-pay | Admitting: *Deleted

## 2019-05-14 ENCOUNTER — Ambulatory Visit (INDEPENDENT_AMBULATORY_CARE_PROVIDER_SITE_OTHER): Payer: Self-pay | Admitting: Family

## 2019-05-14 ENCOUNTER — Ambulatory Visit
Admission: RE | Admit: 2019-05-14 | Discharge: 2019-05-14 | Disposition: A | Discharge status: Home / Self Care | Payer: Medicare Other | Source: Ambulatory Visit | Attending: Radiation Oncology | Admitting: Radiation Oncology

## 2019-05-14 VITALS — BP 157/86 | HR 86 | Temp 98.4°F | Resp 18 | Ht 69.0 in | Wt 195.0 lb

## 2019-05-14 VITALS — BP 153/86 | HR 79 | Temp 97.9°F | Resp 18

## 2019-05-14 DIAGNOSIS — R5383 Other fatigue: Secondary | ICD-10-CM | POA: Diagnosis not present

## 2019-05-14 DIAGNOSIS — Z9221 Personal history of antineoplastic chemotherapy: Secondary | ICD-10-CM | POA: Diagnosis not present

## 2019-05-14 DIAGNOSIS — Z79899 Other long term (current) drug therapy: Secondary | ICD-10-CM | POA: Diagnosis not present

## 2019-05-14 DIAGNOSIS — J449 Chronic obstructive pulmonary disease, unspecified: Secondary | ICD-10-CM | POA: Diagnosis not present

## 2019-05-14 DIAGNOSIS — Z510 Encounter for antineoplastic radiation therapy: Secondary | ICD-10-CM | POA: Diagnosis not present

## 2019-05-14 DIAGNOSIS — Z5111 Encounter for antineoplastic chemotherapy: Secondary | ICD-10-CM | POA: Insufficient documentation

## 2019-05-14 DIAGNOSIS — I120 Hypertensive chronic kidney disease with stage 5 chronic kidney disease or end stage renal disease: Secondary | ICD-10-CM | POA: Insufficient documentation

## 2019-05-14 DIAGNOSIS — N186 End stage renal disease: Secondary | ICD-10-CM

## 2019-05-14 DIAGNOSIS — Z955 Presence of coronary angioplasty implant and graft: Secondary | ICD-10-CM | POA: Insufficient documentation

## 2019-05-14 DIAGNOSIS — Z87891 Personal history of nicotine dependence: Secondary | ICD-10-CM | POA: Insufficient documentation

## 2019-05-14 DIAGNOSIS — Z5189 Encounter for other specified aftercare: Secondary | ICD-10-CM | POA: Diagnosis not present

## 2019-05-14 DIAGNOSIS — E039 Hypothyroidism, unspecified: Secondary | ICD-10-CM | POA: Diagnosis not present

## 2019-05-14 DIAGNOSIS — D696 Thrombocytopenia, unspecified: Secondary | ICD-10-CM | POA: Insufficient documentation

## 2019-05-14 DIAGNOSIS — C3490 Malignant neoplasm of unspecified part of unspecified bronchus or lung: Secondary | ICD-10-CM

## 2019-05-14 DIAGNOSIS — Z992 Dependence on renal dialysis: Secondary | ICD-10-CM

## 2019-05-14 DIAGNOSIS — Z923 Personal history of irradiation: Secondary | ICD-10-CM | POA: Insufficient documentation

## 2019-05-14 DIAGNOSIS — M109 Gout, unspecified: Secondary | ICD-10-CM | POA: Insufficient documentation

## 2019-05-14 DIAGNOSIS — C3412 Malignant neoplasm of upper lobe, left bronchus or lung: Secondary | ICD-10-CM | POA: Diagnosis not present

## 2019-05-14 DIAGNOSIS — D61818 Other pancytopenia: Secondary | ICD-10-CM | POA: Diagnosis not present

## 2019-05-14 DIAGNOSIS — D631 Anemia in chronic kidney disease: Secondary | ICD-10-CM | POA: Diagnosis not present

## 2019-05-14 DIAGNOSIS — C3481 Malignant neoplasm of overlapping sites of right bronchus and lung: Secondary | ICD-10-CM | POA: Diagnosis not present

## 2019-05-14 DIAGNOSIS — T82898A Other specified complication of vascular prosthetic devices, implants and grafts, initial encounter: Secondary | ICD-10-CM

## 2019-05-14 DIAGNOSIS — E785 Hyperlipidemia, unspecified: Secondary | ICD-10-CM | POA: Insufficient documentation

## 2019-05-14 DIAGNOSIS — I252 Old myocardial infarction: Secondary | ICD-10-CM | POA: Insufficient documentation

## 2019-05-14 DIAGNOSIS — K219 Gastro-esophageal reflux disease without esophagitis: Secondary | ICD-10-CM | POA: Insufficient documentation

## 2019-05-14 MED ORDER — SODIUM CHLORIDE 0.9 % IV SOLN
80.0000 mg/m2 | Freq: Once | INTRAVENOUS | Status: AC
  Administered 2019-05-14: 170 mg via INTRAVENOUS
  Filled 2019-05-14: qty 8.5

## 2019-05-14 MED ORDER — PEGFILGRASTIM 6 MG/0.6ML ~~LOC~~ PSKT
PREFILLED_SYRINGE | SUBCUTANEOUS | Status: AC
  Filled 2019-05-14: qty 0.6

## 2019-05-14 MED ORDER — PEGFILGRASTIM 6 MG/0.6ML ~~LOC~~ PSKT
6.0000 mg | PREFILLED_SYRINGE | Freq: Once | SUBCUTANEOUS | Status: AC
  Administered 2019-05-14: 6 mg via SUBCUTANEOUS

## 2019-05-14 MED ORDER — DEXAMETHASONE SODIUM PHOSPHATE 10 MG/ML IJ SOLN
INTRAMUSCULAR | Status: AC
  Filled 2019-05-14: qty 1

## 2019-05-14 MED ORDER — SODIUM CHLORIDE 0.9 % IV SOLN
Freq: Once | INTRAVENOUS | Status: AC
  Administered 2019-05-14: 10:00:00 via INTRAVENOUS
  Filled 2019-05-14: qty 250

## 2019-05-14 MED ORDER — DEXAMETHASONE SODIUM PHOSPHATE 10 MG/ML IJ SOLN
10.0000 mg | Freq: Once | INTRAMUSCULAR | Status: AC
  Administered 2019-05-14: 10 mg via INTRAVENOUS

## 2019-05-14 NOTE — H&P (View-Only) (Signed)
CC: no palpable thrill, no audible bruit at left arm AVF s/p creation of AVF on 04-25-19, ESRD on hemodialysis  History of Present Illness  Jeremy Johnson is a 73 y.o. (May 23, 1946) male who is s/p left brachiocephalic AV fistula creation on 04-25-19 by Dr. Scot Dock.  He states that in 2006 Dr. Amedeo Plenty performed an open AAA repair. Pt denies back pain or abdominal pain. He has an asymptomatic incisional hernia from this.   He returns today with reported no thrill at AVF, appears to have been referred by Dr. Marval Regal.   He started hemodialysis 2 weeks ago via right IJ TDC on T-T-S at [facility] in [place].  He denies tingling or numbness in his left hand or arm. He is right hand dominant.    He is undergoing treatment for small cell lung cancer.  He is a former smoker, quit in 2018, smoked 1/2 ppd x 54 years.    Past Medical History:  Diagnosis Date  . Anemia   . Blood transfusion without reported diagnosis   . CAD (coronary artery disease)    STENT... MID CIRCUMFLEX...1997  . Cancer ([place])   . Chronic kidney disease    STAGE 3  . Degenerative joint disease (DJD) of lumbar spine   . GERD (gastroesophageal reflux disease)   . Gout   . Hyperlipidemia   . Hypertension   . Hypothyroidism   . Incisional hernia   . Leukocytosis    CHRONIC MILD  . Myocardial infarction [facility])    1997    Social History Social History   Tobacco Use  . Smoking status: Former Smoker    Packs/day: 0.50    Years: 54.00    Pack years: 27.00    Quit date: 09/17/2017    Years since quitting: 1.6  . Smokeless tobacco: Never Used  Substance Use Topics  . Alcohol use: Yes    Comment: occasional  . Drug use: No    Family History Family History  Problem Relation Age of Onset  . Stroke Brother   . Lung cancer Sister 76       lung cancer/former    Surgical History Past Surgical History:  Procedure Laterality Date  . ABDOMINAL AORTIC ANEURYSM REPAIR  2006  . AV FISTULA PLACEMENT Left  04/25/2019   Procedure: ARTERIOVENOUS (AV) FISTULA CREATION LEFT ARM;  Surgeon: Angelia Mould, MD;  Location: [facility] [facility];  Service: Vascular;  Laterality: Left;  . BIOPSY  09/30/2018   Procedure: BIOPSY;  Surgeon: Danie Binder, MD;  [facility];  Service: Endoscopy;;  ascending colon  . COLONOSCOPY  2008  . COLONOSCOPY N/A 09/30/2018   Procedure: COLONOSCOPY;  Surgeon: Danie Binder, MD;  [facility];  Service: Endoscopy;  Laterality: N/A;  9:00  . CORONARY ANGIOPLASTY WITH STENT PLACEMENT  2008   MID CIRCUMFLEX  . IR FLUORO GUIDE CV LINE RIGHT  04/22/2019  . IR US GUIDE VASC ACCESS RIGHT  04/22/2019  . POLYPECTOMY  09/30/2018   Procedure: POLYPECTOMY;  Surgeon: Danie Binder, MD;  [facility];  Service: Endoscopy;;  colon  . VIDEO BRONCHOSCOPY WITH ENDOBRONCHIAL NAVIGATION N/A 01/27/2019   Procedure: VIDEO BRONCHOSCOPY WITH ENDOBRONCHIAL NAVIGATION;  Surgeon: Grace Isaac, MD;  Location: [facility];  Service: Thoracic;  Laterality: N/A;  . VIDEO BRONCHOSCOPY WITH ENDOBRONCHIAL ULTRASOUND N/A 01/27/2019   Procedure: VIDEO BRONCHOSCOPY WITH ENDOBRONCHIAL ULTRASOUND;  Surgeon: Grace Isaac, MD;  Location: [facility];  Service: Thoracic;  Laterality:  N/A;    Allergies  Allergen Reactions  . Penicillins Rash and Other (See Comments)    Has patient had a PCN reaction causing immediate rash, facial/tongue/throat swelling, SOB or lightheadedness with hypotension: No Has patient had a PCN reaction causing severe rash involving mucus membranes or skin necrosis: No Has patient had a PCN reaction that required hospitalization: No Has patient had a PCN reaction occurring within the last 10 years: No If all of the above answers are "NO", then may proceed with Cephalosporin use.     Current Outpatient Medications  Medication Sig Dispense Refill  . allopurinol (ZYLOPRIM) 100 MG tablet Take 1 tablet (100 mg total) by mouth daily. 30 tablet 1  . amLODipine  (NORVASC) 5 MG tablet Take 5 mg by mouth daily.     [name] aspirin EC 81 MG tablet Take 81 mg by mouth daily.    . Darbepoetin Alfa (ARANESP) 100 MCG/0.5ML SOSY injection Inject 0.5 mLs (100 mcg total) into the vein every Thursday with hemodialysis. 4.2 mL   . levothyroxine (SYNTHROID, LEVOTHROID) 175 MCG tablet Take 175 mcg by mouth daily before breakfast.    . metoprolol tartrate (LOPRESSOR) 50 MG tablet Take 25 mg by mouth 2 (two) times daily.     . mometasone (ELOCON) 0.1 % ointment Apply 1 application topically 2 (two) times daily as needed (for eczema).   2  . omeprazole (PRILOSEC) 20 MG capsule Take 20 mg by mouth daily.    . prochlorperazine (COMPAZINE) 10 MG tablet Take 1 tablet (10 mg total) by mouth every 6 (six) hours as needed for nausea or vomiting. 30 tablet 0  . sevelamer carbonate (RENVELA) 800 MG tablet Take 2 tablets (1,600 mg total) by mouth 3 (three) times daily with meals. 180 tablet 2  . simvastatin (ZOCOR) 20 MG tablet Take 20 mg by mouth daily.     . sucralfate (CARAFATE) 1 g tablet Take 1 tablet (1 g total) by mouth 4 (four) times daily -  with meals and at bedtime. 5 min before meals for radiation induced esophagitis 120 tablet 2   No current facility-administered medications for this visit.      REVIEW OF SYSTEMS: see HPI for pertinent positives and negatives    PHYSICAL EXAMINATION:  Vitals:   05/14/19 1511  BP: (!) 157/86  Pulse: 86  Resp: 18  Temp: 98.4 F (36.9 C)  TempSrc: Temporal  SpO2: 99%  Weight: 195 lb (88.5 kg)  Height: 5\' 9"  (1.753 m)   Body mass index is 28.8 kg/m.  General: The patient appears his stated age.   HEENT:  No gross abnormalities Pulmonary: Respirations are non-labored Abdomen: Soft and non-tender. Moderate sized asymptomatic incisional hernia at AAA well healed old incision.  Musculoskeletal: There are no major deformities.   Neurologic: No focal weakness or paresthesias are detected. CN 2-12 grossly intact except that he has  considerable hearing loss. Bilateral hand grip strength is 5/5.  Skin: There are no ulcer or rashes noted. Psychiatric: The patient has normal affect. Cardiovascular: There is a regular rate and rhythm without significant murmur appreciated.  Radial pulses are 2+ palpable bilaterally. Left brachiocephalic AVF has no palpable thrill, no audible bruit. Left brachial pulse is palpable.  Left antecubital incision is healing well, edges well proximated, with no signs of infection. Some surgical glue remains.   Non-Invasive Vascular Imaging  Left arm Access Duplex  (Date: 05/14/2019): AVF - thrombus noted at the antecubital fossa region with no color or Doppler flow.  Widely patent left basilic vein.   Medical Decision Making  Jeremy Johnson is a 73 y.o. male who is s/p left brachiocephalic AV fistula creation on 04-25-19 by Dr. Scot Dock.  No palpable thrill and no audible bruit left arm AVF. Left radial pulse is 2+ palpable, left brachial pulse is 1+ palpable.   Dr. Scot Dock spoke with and examined pt.  He reviewed vein mapping results from 04-24-19.  Will schedule for conversion of left arm AVF to AV graft on 05-19-19 by Dr. Scot Dock.   Jeremy Chambers, RN, MSN, FNP-C [facility] Office: [phone]  05/14/2019, 3:39 PM  Clinic MD: Scot Dock

## 2019-05-14 NOTE — Patient Instructions (Addendum)
Remove Neulasta Onpro after 4 pm on Thursday 05/15/19.   [facility] Discharge Instructions for Patients Receiving Chemotherapy  Today you received the following chemotherapy agents Etoposide (VEPESID)  To help prevent nausea and vomiting after your treatment, we encourage you to take your nausea medication as prescribed.   If you develop nausea and vomiting that is not controlled by your nausea medication, call the clinic.   BELOW ARE SYMPTOMS THAT SHOULD BE REPORTED IMMEDIATELY:  *FEVER GREATER THAN 100.5 F  *CHILLS WITH OR WITHOUT FEVER  NAUSEA AND VOMITING THAT IS NOT CONTROLLED WITH YOUR NAUSEA MEDICATION  *UNUSUAL SHORTNESS OF BREATH  *UNUSUAL BRUISING OR BLEEDING  TENDERNESS IN MOUTH AND THROAT WITH OR WITHOUT PRESENCE OF ULCERS  *URINARY PROBLEMS  *BOWEL PROBLEMS  UNUSUAL RASH Items with * indicate a potential emergency and should be followed up as soon as possible.  Feel free to call the clinic should you have any questions or concerns. The clinic phone number is [phone].  Please show the [facility] at check-in to the Emergency Department and triage nurse.

## 2019-05-14 NOTE — Progress Notes (Signed)
CC: no palpable thrill, no audible bruit at left arm AVF s/p creation of AVF on 04-25-19, ESRD on hemodialysis  History of Present Illness  Jeremy Johnson is a 73 y.o. (1946-08-16) male who is s/p left brachiocephalic AV fistula creation on 04-25-19 by Dr. Scot Dock.  He states that in 2006 Dr. Amedeo Plenty performed an open AAA repair. Pt denies back pain or abdominal pain. He has an asymptomatic incisional hernia from this.   He returns today with reported no thrill at AVF, appears to have been referred by Dr. Marval Regal.   He started hemodialysis 2 weeks ago via right IJ TDC on T-T-S at [facility] in [place].  He denies tingling or numbness in his left hand or arm. He is right hand dominant.    He is undergoing treatment for small cell lung cancer.  He is a former smoker, quit in 2018, smoked 1/2 ppd x 54 years.    Past Medical History:  Diagnosis Date  . Anemia   . Blood transfusion without reported diagnosis   . CAD (coronary artery disease)    STENT... MID CIRCUMFLEX...1997  . Cancer ([place])   . Chronic kidney disease    STAGE 3  . Degenerative joint disease (DJD) of lumbar spine   . GERD (gastroesophageal reflux disease)   . Gout   . Hyperlipidemia   . Hypertension   . Hypothyroidism   . Incisional hernia   . Leukocytosis    CHRONIC MILD  . Myocardial infarction [facility])    1997    Social History Social History   Tobacco Use  . Smoking status: Former Smoker    Packs/day: 0.50    Years: 54.00    Pack years: 27.00    Quit date: 09/17/2017    Years since quitting: 1.6  . Smokeless tobacco: Never Used  Substance Use Topics  . Alcohol use: Yes    Comment: occasional  . Drug use: No    Family History Family History  Problem Relation Age of Onset  . Stroke Brother   . Lung cancer Sister 18       lung cancer/former    Surgical History Past Surgical History:  Procedure Laterality Date  . ABDOMINAL AORTIC ANEURYSM REPAIR  2006  . AV FISTULA PLACEMENT Left  04/25/2019   Procedure: ARTERIOVENOUS (AV) FISTULA CREATION LEFT ARM;  Surgeon: Angelia Mould, MD;  Location: [facility];  Service: Vascular;  Laterality: Left;  . BIOPSY  09/30/2018   Procedure: BIOPSY;  Surgeon: Danie Binder, MD;  [facility];  Service: Endoscopy;;  ascending colon  . COLONOSCOPY  2008  . COLONOSCOPY N/A 09/30/2018   Procedure: COLONOSCOPY;  Surgeon: Danie Binder, MD;  [facility];  Service: Endoscopy;  Laterality: N/A;  9:00  . CORONARY ANGIOPLASTY WITH STENT PLACEMENT  2008   MID CIRCUMFLEX  . IR FLUORO GUIDE CV LINE RIGHT  04/22/2019  . IR US GUIDE VASC ACCESS RIGHT  04/22/2019  . POLYPECTOMY  09/30/2018   Procedure: POLYPECTOMY;  Surgeon: Danie Binder, MD;  [facility];  Service: Endoscopy;;  colon  . VIDEO BRONCHOSCOPY WITH ENDOBRONCHIAL NAVIGATION N/A 01/27/2019   Procedure: VIDEO BRONCHOSCOPY WITH ENDOBRONCHIAL NAVIGATION;  Surgeon: Grace Isaac, MD;  Location: [facility];  Service: Thoracic;  Laterality: N/A;  . VIDEO BRONCHOSCOPY WITH ENDOBRONCHIAL ULTRASOUND N/A 01/27/2019   Procedure: VIDEO BRONCHOSCOPY WITH ENDOBRONCHIAL ULTRASOUND;  Surgeon: Grace Isaac, MD;  Location: [facility];  Service: Thoracic;  Laterality:  N/A;    Allergies  Allergen Reactions  . Penicillins Rash and Other (See Comments)    Has patient had a PCN reaction causing immediate rash, facial/tongue/throat swelling, SOB or lightheadedness with hypotension: No Has patient had a PCN reaction causing severe rash involving mucus membranes or skin necrosis: No Has patient had a PCN reaction that required hospitalization: No Has patient had a PCN reaction occurring within the last 10 years: No If all of the above answers are "NO", then may proceed with Cephalosporin use.     Current Outpatient Medications  Medication Sig Dispense Refill  . allopurinol (ZYLOPRIM) 100 MG tablet Take 1 tablet (100 mg total) by mouth daily. 30 tablet 1  . amLODipine  (NORVASC) 5 MG tablet Take 5 mg by mouth daily.     [name] aspirin EC 81 MG tablet Take 81 mg by mouth daily.    . Darbepoetin Alfa (ARANESP) 100 MCG/0.5ML SOSY injection Inject 0.5 mLs (100 mcg total) into the vein every Thursday with hemodialysis. 4.2 mL   . levothyroxine (SYNTHROID, LEVOTHROID) 175 MCG tablet Take 175 mcg by mouth daily before breakfast.    . metoprolol tartrate (LOPRESSOR) 50 MG tablet Take 25 mg by mouth 2 (two) times daily.     . mometasone (ELOCON) 0.1 % ointment Apply 1 application topically 2 (two) times daily as needed (for eczema).   2  . omeprazole (PRILOSEC) 20 MG capsule Take 20 mg by mouth daily.    . prochlorperazine (COMPAZINE) 10 MG tablet Take 1 tablet (10 mg total) by mouth every 6 (six) hours as needed for nausea or vomiting. 30 tablet 0  . sevelamer carbonate (RENVELA) 800 MG tablet Take 2 tablets (1,600 mg total) by mouth 3 (three) times daily with meals. 180 tablet 2  . simvastatin (ZOCOR) 20 MG tablet Take 20 mg by mouth daily.     . sucralfate (CARAFATE) 1 g tablet Take 1 tablet (1 g total) by mouth 4 (four) times daily -  with meals and at bedtime. 5 min before meals for radiation induced esophagitis 120 tablet 2   No current facility-administered medications for this visit.      REVIEW OF SYSTEMS: see HPI for pertinent positives and negatives    PHYSICAL EXAMINATION:  Vitals:   05/14/19 1511  BP: (!) 157/86  Pulse: 86  Resp: 18  Temp: 98.4 F (36.9 C)  TempSrc: Temporal  SpO2: 99%  Weight: 195 lb (88.5 kg)  Height: 5\' 9"  (1.753 m)   Body mass index is 28.8 kg/m.  General: The patient appears his stated age.   HEENT:  No gross abnormalities Pulmonary: Respirations are non-labored Abdomen: Soft and non-tender. Moderate sized asymptomatic incisional hernia at AAA well healed old incision.  Musculoskeletal: There are no major deformities.   Neurologic: No focal weakness or paresthesias are detected. CN 2-12 grossly intact except that he has  considerable hearing loss. Bilateral hand grip strength is 5/5.  Skin: There are no ulcer or rashes noted. Psychiatric: The patient has normal affect. Cardiovascular: There is a regular rate and rhythm without significant murmur appreciated.  Radial pulses are 2+ palpable bilaterally. Left brachiocephalic AVF has no palpable thrill, no audible bruit. Left brachial pulse is palpable.  Left antecubital incision is healing well, edges well proximated, with no signs of infection. Some surgical glue remains.   Non-Invasive Vascular Imaging  Left arm Access Duplex  (Date: 05/14/2019): AVF - thrombus noted at the antecubital fossa region with no color or Doppler flow.  Widely patent left basilic vein.   Medical Decision Making  DEVON PRETTY is a 73 y.o. male who is s/p left brachiocephalic AV fistula creation on 04-25-19 by Dr. Scot Dock.  No palpable thrill and no audible bruit left arm AVF. Left radial pulse is 2+ palpable, left brachial pulse is 1+ palpable.   Dr. Scot Dock spoke with and examined pt.  He reviewed vein mapping results from 04-24-19.  Will schedule for conversion of left arm AVF to AV graft on 05-19-19 by Dr. Scot Dock.   Clemon Chambers, RN, MSN, FNP-C [facility] Office: [phone]  05/14/2019, 3:39 PM  Clinic MD: Scot Dock

## 2019-05-15 ENCOUNTER — Other Ambulatory Visit: Payer: Self-pay

## 2019-05-15 ENCOUNTER — Telehealth: Payer: Self-pay | Admitting: *Deleted

## 2019-05-15 ENCOUNTER — Encounter (HOSPITAL_COMMUNITY): Payer: Self-pay | Admitting: *Deleted

## 2019-05-15 ENCOUNTER — Ambulatory Visit
Admission: RE | Admit: 2019-05-15 | Discharge: 2019-05-15 | Disposition: A | Discharge status: Home / Self Care | Payer: Medicare Other | Source: Ambulatory Visit | Attending: Radiation Oncology | Admitting: Radiation Oncology

## 2019-05-15 ENCOUNTER — Encounter: Payer: Self-pay | Admitting: Radiation Oncology

## 2019-05-15 ENCOUNTER — Other Ambulatory Visit (HOSPITAL_COMMUNITY)
Admission: RE | Admit: 2019-05-15 | Discharge: 2019-05-15 | Disposition: A | Discharge status: Home / Self Care | Payer: Medicare Other | Source: Ambulatory Visit | Attending: Vascular Surgery | Admitting: Vascular Surgery

## 2019-05-15 DIAGNOSIS — E039 Hypothyroidism, unspecified: Secondary | ICD-10-CM | POA: Diagnosis not present

## 2019-05-15 DIAGNOSIS — D631 Anemia in chronic kidney disease: Secondary | ICD-10-CM | POA: Diagnosis not present

## 2019-05-15 DIAGNOSIS — Z1159 Encounter for screening for other viral diseases: Secondary | ICD-10-CM | POA: Diagnosis not present

## 2019-05-15 DIAGNOSIS — Z01812 Encounter for preprocedural laboratory examination: Secondary | ICD-10-CM | POA: Diagnosis not present

## 2019-05-15 DIAGNOSIS — N2581 Secondary hyperparathyroidism of renal origin: Secondary | ICD-10-CM | POA: Diagnosis not present

## 2019-05-15 DIAGNOSIS — E876 Hypokalemia: Secondary | ICD-10-CM | POA: Diagnosis not present

## 2019-05-15 DIAGNOSIS — N186 End stage renal disease: Secondary | ICD-10-CM | POA: Diagnosis not present

## 2019-05-15 DIAGNOSIS — Z23 Encounter for immunization: Secondary | ICD-10-CM | POA: Diagnosis not present

## 2019-05-15 DIAGNOSIS — D689 Coagulation defect, unspecified: Secondary | ICD-10-CM | POA: Diagnosis not present

## 2019-05-15 DIAGNOSIS — Z510 Encounter for antineoplastic radiation therapy: Secondary | ICD-10-CM | POA: Diagnosis not present

## 2019-05-15 DIAGNOSIS — C3412 Malignant neoplasm of upper lobe, left bronchus or lung: Secondary | ICD-10-CM | POA: Diagnosis not present

## 2019-05-15 LAB — SARS CORONAVIRUS 2 (TAT 6-24 HRS): SARS Coronavirus 2: NEGATIVE

## 2019-05-15 NOTE — Telephone Encounter (Signed)
Pt called to cancel appt 7/6 as he is having surgery.

## 2019-05-17 DIAGNOSIS — D631 Anemia in chronic kidney disease: Secondary | ICD-10-CM | POA: Diagnosis not present

## 2019-05-17 DIAGNOSIS — Z23 Encounter for immunization: Secondary | ICD-10-CM | POA: Diagnosis not present

## 2019-05-17 DIAGNOSIS — N2581 Secondary hyperparathyroidism of renal origin: Secondary | ICD-10-CM | POA: Diagnosis not present

## 2019-05-17 DIAGNOSIS — D689 Coagulation defect, unspecified: Secondary | ICD-10-CM | POA: Diagnosis not present

## 2019-05-17 DIAGNOSIS — E876 Hypokalemia: Secondary | ICD-10-CM | POA: Diagnosis not present

## 2019-05-17 DIAGNOSIS — N186 End stage renal disease: Secondary | ICD-10-CM | POA: Diagnosis not present

## 2019-05-17 DIAGNOSIS — E039 Hypothyroidism, unspecified: Secondary | ICD-10-CM | POA: Diagnosis not present

## 2019-05-19 ENCOUNTER — Other Ambulatory Visit: Payer: Self-pay

## 2019-05-19 ENCOUNTER — Ambulatory Visit (HOSPITAL_COMMUNITY): Payer: Medicare Other | Admitting: Certified Registered Nurse Anesthetist

## 2019-05-19 ENCOUNTER — Ambulatory Visit (HOSPITAL_COMMUNITY)
Admission: RE | Admit: 2019-05-19 | Discharge: 2019-05-19 | Disposition: A | Discharge status: Home / Self Care | Payer: Medicare Other | Source: Home / Self Care | Attending: Vascular Surgery | Admitting: Vascular Surgery

## 2019-05-19 ENCOUNTER — Encounter (HOSPITAL_COMMUNITY): Payer: Self-pay | Admitting: Certified Registered Nurse Anesthetist

## 2019-05-19 ENCOUNTER — Other Ambulatory Visit: Payer: Medicare Other

## 2019-05-19 ENCOUNTER — Ambulatory Visit (HOSPITAL_COMMUNITY)
Admission: RE | Disposition: A | Discharge status: Home / Self Care | Payer: Self-pay | Source: Home / Self Care | Attending: Vascular Surgery

## 2019-05-19 DIAGNOSIS — Z1159 Encounter for screening for other viral diseases: Secondary | ICD-10-CM | POA: Diagnosis not present

## 2019-05-19 DIAGNOSIS — Z7982 Long term (current) use of aspirin: Secondary | ICD-10-CM | POA: Insufficient documentation

## 2019-05-19 DIAGNOSIS — E039 Hypothyroidism, unspecified: Secondary | ICD-10-CM | POA: Insufficient documentation

## 2019-05-19 DIAGNOSIS — T829XXA Unspecified complication of cardiac and vascular prosthetic device, implant and graft, initial encounter: Secondary | ICD-10-CM | POA: Insufficient documentation

## 2019-05-19 DIAGNOSIS — D696 Thrombocytopenia, unspecified: Secondary | ICD-10-CM | POA: Diagnosis not present

## 2019-05-19 DIAGNOSIS — Z955 Presence of coronary angioplasty implant and graft: Secondary | ICD-10-CM | POA: Insufficient documentation

## 2019-05-19 DIAGNOSIS — I10 Essential (primary) hypertension: Secondary | ICD-10-CM | POA: Diagnosis not present

## 2019-05-19 DIAGNOSIS — Z87891 Personal history of nicotine dependence: Secondary | ICD-10-CM | POA: Insufficient documentation

## 2019-05-19 DIAGNOSIS — I2510 Atherosclerotic heart disease of native coronary artery without angina pectoris: Secondary | ICD-10-CM | POA: Insufficient documentation

## 2019-05-19 DIAGNOSIS — M47816 Spondylosis without myelopathy or radiculopathy, lumbar region: Secondary | ICD-10-CM | POA: Insufficient documentation

## 2019-05-19 DIAGNOSIS — T82898A Other specified complication of vascular prosthetic devices, implants and grafts, initial encounter: Secondary | ICD-10-CM | POA: Diagnosis not present

## 2019-05-19 DIAGNOSIS — D631 Anemia in chronic kidney disease: Secondary | ICD-10-CM | POA: Diagnosis not present

## 2019-05-19 DIAGNOSIS — I120 Hypertensive chronic kidney disease with stage 5 chronic kidney disease or end stage renal disease: Secondary | ICD-10-CM | POA: Insufficient documentation

## 2019-05-19 DIAGNOSIS — C3492 Malignant neoplasm of unspecified part of left bronchus or lung: Secondary | ICD-10-CM | POA: Diagnosis not present

## 2019-05-19 DIAGNOSIS — D61810 Antineoplastic chemotherapy induced pancytopenia: Secondary | ICD-10-CM | POA: Diagnosis not present

## 2019-05-19 DIAGNOSIS — M109 Gout, unspecified: Secondary | ICD-10-CM | POA: Insufficient documentation

## 2019-05-19 DIAGNOSIS — Z992 Dependence on renal dialysis: Secondary | ICD-10-CM | POA: Diagnosis not present

## 2019-05-19 DIAGNOSIS — Z79899 Other long term (current) drug therapy: Secondary | ICD-10-CM | POA: Insufficient documentation

## 2019-05-19 DIAGNOSIS — K219 Gastro-esophageal reflux disease without esophagitis: Secondary | ICD-10-CM | POA: Insufficient documentation

## 2019-05-19 DIAGNOSIS — I252 Old myocardial infarction: Secondary | ICD-10-CM | POA: Insufficient documentation

## 2019-05-19 DIAGNOSIS — E785 Hyperlipidemia, unspecified: Secondary | ICD-10-CM | POA: Insufficient documentation

## 2019-05-19 DIAGNOSIS — N186 End stage renal disease: Secondary | ICD-10-CM | POA: Diagnosis not present

## 2019-05-19 DIAGNOSIS — Z79890 Hormone replacement therapy (postmenopausal): Secondary | ICD-10-CM | POA: Insufficient documentation

## 2019-05-19 DIAGNOSIS — C3490 Malignant neoplasm of unspecified part of unspecified bronchus or lung: Secondary | ICD-10-CM | POA: Insufficient documentation

## 2019-05-19 HISTORY — DX: Chronic obstructive pulmonary disease, unspecified: J44.9

## 2019-05-19 HISTORY — PX: AV FISTULA PLACEMENT: SHX1204

## 2019-05-19 SURGERY — INSERTION OF ARTERIOVENOUS (AV) GORE-TEX GRAFT ARM
Anesthesia: Monitor Anesthesia Care | Site: Arm Upper | Laterality: Left

## 2019-05-19 MED ORDER — DEXAMETHASONE SODIUM PHOSPHATE 10 MG/ML IJ SOLN
INTRAMUSCULAR | Status: DC | PRN
  Administered 2019-05-19: 4 mg via INTRAVENOUS

## 2019-05-19 MED ORDER — LIDOCAINE-EPINEPHRINE (PF) 1 %-1:200000 IJ SOLN
INTRAMUSCULAR | Status: AC
  Filled 2019-05-19: qty 30

## 2019-05-19 MED ORDER — PROPOFOL 10 MG/ML IV BOLUS
INTRAVENOUS | Status: AC
  Filled 2019-05-19: qty 20

## 2019-05-19 MED ORDER — PAPAVERINE HCL 30 MG/ML IJ SOLN
INTRAMUSCULAR | Status: DC | PRN
  Administered 2019-05-19: 60 mg

## 2019-05-19 MED ORDER — FENTANYL CITRATE (PF) 100 MCG/2ML IJ SOLN: 25.0000 ug | INTRAMUSCULAR | Status: DC | PRN

## 2019-05-19 MED ORDER — PROMETHAZINE HCL 25 MG/ML IJ SOLN: 6.2500 mg | INTRAMUSCULAR | Status: DC | PRN

## 2019-05-19 MED ORDER — THROMBIN 20000 UNITS EX SOLR
CUTANEOUS | Status: DC | PRN
  Administered 2019-05-19: 20 mL via TOPICAL

## 2019-05-19 MED ORDER — ONDANSETRON HCL 4 MG/2ML IJ SOLN
INTRAMUSCULAR | Status: DC | PRN
  Administered 2019-05-19: 4 mg via INTRAVENOUS

## 2019-05-19 MED ORDER — FENTANYL CITRATE (PF) 250 MCG/5ML IJ SOLN
INTRAMUSCULAR | Status: AC
  Filled 2019-05-19: qty 5

## 2019-05-19 MED ORDER — PROTAMINE SULFATE 10 MG/ML IV SOLN
INTRAVENOUS | Status: DC | PRN
  Administered 2019-05-19: 50 mg via INTRAVENOUS

## 2019-05-19 MED ORDER — PROPOFOL 500 MG/50ML IV EMUL
INTRAVENOUS | Status: DC | PRN
  Administered 2019-05-19: 75 ug/kg/min via INTRAVENOUS

## 2019-05-19 MED ORDER — PROTAMINE SULFATE 10 MG/ML IV SOLN
INTRAVENOUS | Status: AC
  Filled 2019-05-19: qty 5

## 2019-05-19 MED ORDER — SODIUM CHLORIDE 0.9 % IV SOLN
INTRAVENOUS | Status: DC | PRN
  Administered 2019-05-19: 10 ug/min via INTRAVENOUS

## 2019-05-19 MED ORDER — CHLORHEXIDINE GLUCONATE 4 % EX LIQD: 60.0000 mL | Freq: Once | CUTANEOUS | Status: DC

## 2019-05-19 MED ORDER — HYDROCODONE-ACETAMINOPHEN 5-325 MG PO TABS: 1.0000 | ORAL_TABLET | Freq: Four times a day (QID) | ORAL | 0 refills | Status: DC | PRN

## 2019-05-19 MED ORDER — FENTANYL CITRATE (PF) 100 MCG/2ML IJ SOLN
INTRAMUSCULAR | Status: DC | PRN
  Administered 2019-05-19: 75 ug via INTRAVENOUS

## 2019-05-19 MED ORDER — SODIUM CHLORIDE 0.9 % IV SOLN
INTRAVENOUS | Status: AC
  Filled 2019-05-19: qty 1.2

## 2019-05-19 MED ORDER — LIDOCAINE-EPINEPHRINE (PF) 1 %-1:200000 IJ SOLN
INTRAMUSCULAR | Status: DC | PRN
  Administered 2019-05-19: 25 mL

## 2019-05-19 MED ORDER — SODIUM CHLORIDE 0.9 % IV SOLN
INTRAVENOUS | Status: DC
  Administered 2019-05-19: 07:00:00 via INTRAVENOUS

## 2019-05-19 MED ORDER — HEPARIN SODIUM (PORCINE) 1000 UNIT/ML IJ SOLN
INTRAMUSCULAR | Status: AC
  Filled 2019-05-19: qty 1

## 2019-05-19 MED ORDER — 0.9 % SODIUM CHLORIDE (POUR BTL) OPTIME
TOPICAL | Status: DC | PRN
  Administered 2019-05-19: 1000 mL

## 2019-05-19 MED ORDER — PAPAVERINE HCL 30 MG/ML IJ SOLN
INTRAMUSCULAR | Status: AC
  Filled 2019-05-19: qty 2

## 2019-05-19 MED ORDER — THROMBIN (RECOMBINANT) 20000 UNITS EX SOLR
CUTANEOUS | Status: AC
  Filled 2019-05-19: qty 20000

## 2019-05-19 MED ORDER — HEPARIN SODIUM (PORCINE) 1000 UNIT/ML IJ SOLN
INTRAMUSCULAR | Status: DC | PRN
  Administered 2019-05-19: 8000 [IU] via INTRAVENOUS

## 2019-05-19 MED ORDER — VANCOMYCIN HCL IN DEXTROSE 1-5 GM/200ML-% IV SOLN
1000.0000 mg | INTRAVENOUS | Status: AC
  Administered 2019-05-19: 1000 mg via INTRAVENOUS
  Filled 2019-05-19: qty 200

## 2019-05-19 MED ORDER — LIDOCAINE HCL (PF) 1 % IJ SOLN
INTRAMUSCULAR | Status: AC
  Filled 2019-05-19: qty 30

## 2019-05-19 MED ORDER — SODIUM CHLORIDE 0.9 % IV SOLN
INTRAVENOUS | Status: DC | PRN
  Administered 2019-05-19: 500 mL

## 2019-05-19 MED ORDER — PROPOFOL 10 MG/ML IV BOLUS
INTRAVENOUS | Status: DC | PRN
  Administered 2019-05-19 (×2): 20 mg via INTRAVENOUS
  Administered 2019-05-19: 10 mg via INTRAVENOUS

## 2019-05-19 SURGICAL SUPPLY — 37 items
ADH SKN CLS APL DERMABOND .7 (GAUZE/BANDAGES/DRESSINGS) ×1
ARMBAND PINK RESTRICT EXTREMIT (MISCELLANEOUS) ×4 IMPLANT
CANISTER SUCT 3000ML PPV (MISCELLANEOUS) ×3 IMPLANT
CANNULA VESSEL 3MM 2 BLNT TIP (CANNULA) ×5 IMPLANT
CLIP VESOCCLUDE MED 6/CT (CLIP) ×3 IMPLANT
CLIP VESOCCLUDE SM WIDE 6/CT (CLIP) ×3 IMPLANT
COVER WAND RF STERILE (DRAPES) ×1 IMPLANT
DECANTER SPIKE VIAL GLASS SM (MISCELLANEOUS) ×3 IMPLANT
DERMABOND ADVANCED (GAUZE/BANDAGES/DRESSINGS) ×2
DERMABOND ADVANCED .7 DNX12 (GAUZE/BANDAGES/DRESSINGS) ×1 IMPLANT
ELECT REM PT RETURN 9FT ADLT (ELECTROSURGICAL) ×3
ELECTRODE REM PT RTRN 9FT ADLT (ELECTROSURGICAL) ×1 IMPLANT
GAUZE 4X4 16PLY RFD (DISPOSABLE) ×2 IMPLANT
GLOVE BIO SURGEON STRL SZ7.5 (GLOVE) ×3 IMPLANT
GLOVE BIOGEL PI IND STRL 8 (GLOVE) ×1 IMPLANT
GLOVE BIOGEL PI INDICATOR 8 (GLOVE) ×2
GOWN STRL REUS W/ TWL LRG LVL3 (GOWN DISPOSABLE) ×3 IMPLANT
GOWN STRL REUS W/TWL LRG LVL3 (GOWN DISPOSABLE) ×6
GRAFT GORETEX STRT 4-7X45 (Vascular Products) ×2 IMPLANT
KIT BASIN OR (CUSTOM PROCEDURE TRAY) ×3 IMPLANT
KIT TURNOVER KIT B (KITS) ×3 IMPLANT
NDL 18GX1X1/2 (RX/OR ONLY) (NEEDLE) IMPLANT
NEEDLE 18GX1X1/2 (RX/OR ONLY) (NEEDLE) ×3 IMPLANT
NS IRRIG 1000ML POUR BTL (IV SOLUTION) ×3 IMPLANT
PACK CV ACCESS (CUSTOM PROCEDURE TRAY) ×3 IMPLANT
PAD ARMBOARD 7.5X6 YLW CONV (MISCELLANEOUS) ×6 IMPLANT
SPONGE SURGIFOAM ABS GEL 100 (HEMOSTASIS) ×2 IMPLANT
SUT PROLENE 6 0 BV (SUTURE) ×14 IMPLANT
SUT VIC AB 3-0 SH 27 (SUTURE) ×6
SUT VIC AB 3-0 SH 27X BRD (SUTURE) ×2 IMPLANT
SUT VICRYL 4-0 PS2 18IN ABS (SUTURE) ×6 IMPLANT
SYR 5ML LL (SYRINGE) ×2 IMPLANT
SYR TOOMEY 50ML (SYRINGE) IMPLANT
SYRINGE 20CC LL (MISCELLANEOUS) ×2 IMPLANT
TOWEL GREEN STERILE (TOWEL DISPOSABLE) ×3 IMPLANT
UNDERPAD 30X30 (UNDERPADS AND DIAPERS) ×3 IMPLANT
WATER STERILE IRR 1000ML POUR (IV SOLUTION) ×3 IMPLANT

## 2019-05-19 NOTE — Anesthesia Preprocedure Evaluation (Signed)
Anesthesia Evaluation  Patient identified by MRN, date of birth, ID band Patient awake    Reviewed: Allergy & Precautions, NPO status , Patient's Chart, lab work & pertinent test results  Airway Mallampati: II  TM Distance: >3 FB Neck ROM: Full    Dental no notable dental hx.    Pulmonary COPD, former smoker,    Pulmonary exam normal breath sounds clear to auscultation       Cardiovascular hypertension, + CAD, + Past MI and + Cardiac Stents  Normal cardiovascular exam Rhythm:Regular Rate:Normal     Neuro/Psych negative neurological ROS  negative psych ROS   GI/Hepatic negative GI ROS, Neg liver ROS,   Endo/Other  negative endocrine ROS  Renal/GU negative Renal ROS  negative genitourinary   Musculoskeletal negative musculoskeletal ROS (+)   Abdominal   Peds negative pediatric ROS (+)  Hematology negative hematology ROS (+)   Anesthesia Other Findings   Reproductive/Obstetrics negative OB ROS                             Anesthesia Physical Anesthesia Plan  ASA: III  Anesthesia Plan: MAC   Post-op Pain Management:    Induction: Intravenous  PONV Risk Score and Plan: 0  Airway Management Planned: Simple Face Mask  Additional Equipment:   Intra-op Plan:   Post-operative Plan:   Informed Consent: I have reviewed the patients History and Physical, chart, labs and discussed the procedure including the risks, benefits and alternatives for the proposed anesthesia with the patient or authorized representative who has indicated his/her understanding and acceptance.     Dental advisory given  Plan Discussed with: CRNA and Surgeon  Anesthesia Plan Comments:         Anesthesia Quick Evaluation

## 2019-05-19 NOTE — Interval H&P Note (Signed)
History and Physical Interval Note:  05/19/2019 7:14 AM  Jeremy Johnson  has presented today for surgery, with the diagnosis of COMPLICATION OF ARTERIOVENOUS FISTULA.  The various methods of treatment have been discussed with the patient and family. After consideration of risks, benefits and other options for treatment, the patient has consented to  Procedure(s): CONVERSION OF LEFT ARM ARTERIOVENOUS FISTULA TO GRAFT (Left) as a surgical intervention.  The patient's history has been reviewed, patient examined, no change in status, stable for surgery.  I have reviewed the patient's chart and labs.  Questions were answered to the patient's satisfaction.     [name]

## 2019-05-19 NOTE — Transfer of Care (Signed)
Immediate Anesthesia Transfer of Care Note  Patient: Jeremy Johnson  Procedure(s) Performed: CONVERSION OF LEFT ARM ARTERIOVENOUS FISTULA TO GRAFT (Left Arm Upper)  Patient Location: PACU  Anesthesia Type:MAC  Level of Consciousness: drowsy and patient cooperative  Airway & Oxygen Therapy: Patient Spontanous Breathing  Post-op Assessment: Report given to RN, Post -op Vital signs reviewed and stable and Patient moving all extremities X 4  Post vital signs: Reviewed and stable  Last Vitals:  Vitals Value Taken Time  BP 121/71 05/19/19 0930  Temp    Pulse 76 05/19/19 0930  Resp 10 05/19/19 0930  SpO2 98 % 05/19/19 0930  Vitals shown include unvalidated device data.  Last Pain:  Vitals:   05/19/19 0646  TempSrc:   PainSc: 0-No pain      Patients Stated Pain Goal: 3 (32/95/18 8416)  Complications: No apparent anesthesia complications

## 2019-05-19 NOTE — Discharge Instructions (Signed)
° °  [facility] ° °Discharge Instructions ° °AV Fistula or Graft Surgery for Dialysis Access ° °Please refer to the following instructions for your post-procedure care. Your surgeon or physician assistant will discuss any changes with you. ° °Activity ° °You may drive the day following your surgery, if you are comfortable and no longer taking prescription pain medication. Resume full activity as the soreness in your incision resolves. ° °Bathing/Showering ° °You may shower after you go home. Keep your incision dry for 48 hours. Do not soak in a bathtub, hot tub, or swim until the incision heals completely. You may not shower if you have a hemodialysis catheter. ° °Incision Care ° °Clean your incision with mild soap and water after 48 hours. Pat the area dry with a clean towel. You do not need a bandage unless otherwise instructed. Do not apply any ointments or creams to your incision. You may have skin glue on your incision. Do not peel it off. It will come off on its own in about one week. Your arm may swell a bit after surgery. To reduce swelling use pillows to elevate your arm so it is above your heart. Your doctor will tell you if you need to lightly wrap your arm with an ACE bandage. ° °Diet ° °Resume your normal diet. There are not special food restrictions following this procedure. In order to heal from your surgery, it is CRITICAL to get adequate nutrition. Your body requires vitamins, minerals, and protein. Vegetables are the best source of vitamins and minerals. Vegetables also provide the perfect balance of protein. Processed food has little nutritional value, so try to avoid this. ° °Medications ° °Resume taking all of your medications. If your incision is causing pain, you may take over-the counter pain relievers such as acetaminophen (Tylenol). If you were prescribed a stronger pain medication, please be aware these medications can cause nausea and constipation. Prevent  nausea by taking the medication with a snack or meal. Avoid constipation by drinking plenty of fluids and eating foods with high amount of fiber, such as fruits, vegetables, and grains. Do not take Tylenol if you are taking prescription pain medications. ° ° ° ° °Follow up °Your surgeon may want to see you in the office following your access surgery. If so, this will be arranged at the time of your surgery. ° °Please call us immediately for any of the following conditions: ° °Increased pain, redness, drainage (pus) from your incision site °Fever of 101 degrees or higher °Severe or worsening pain at your incision site °Hand pain or numbness. ° °Reduce your risk of vascular disease: ° °Stop smoking. If you would like help, call [facility] at 1-800-QUIT-NOW (1-800-784-8669) or [place] at [phone] ° °Manage your cholesterol °Maintain a desired weight °Control your diabetes °Keep your blood pressure down ° °Dialysis ° °It will take several weeks to several months for your new dialysis access to be ready for use. Your surgeon will determine when it is OK to use it. Your nephrologist will continue to direct your dialysis. You can continue to use your Permcath until your new access is ready for use. ° °If you have any questions, please call the office at [phone]. ° °

## 2019-05-19 NOTE — Op Note (Signed)
    NAME: [name]    MRN: [id number] DOB: [birth year]    DATE OF OPERATION: 05/19/2019  PREOP DIAGNOSIS:    End-stage renal disease  POSTOP DIAGNOSIS:    Same  PROCEDURE:    New left upper arm AV graft (4-7 mm PTFE graft)  SURGEON: Judeth Cornfield. Scot Dock, MD, FACS  ASSIST: Arlee Muslim, PA  ANESTHESIA: Local with sedation  EBL: Minimal  INDICATIONS:    Jeremy Johnson is a 73 y.o. male who presents for new access.  He has a functioning catheter.  He had a left brachiocephalic fistula placed which failed very quickly.  The basilic vein was not adequate.  FINDINGS:   Small antecubital veins therefore an upper arm graft was placed.  TECHNIQUE:   The patient was taken to the operating room and sedated by anesthesia.  The left upper extremity was prepped and draped in usual sterile fashion.  After the skin was anesthetized a longitudinal incision was made over the brachial artery just above the antecubital space.  Here the artery was dissected free and was reasonable in size.  The adjacent veins were quite small.  I elected to place an upper arm graft.  Separate longitudinal incision was made beneath the axilla after the skin was anesthetized.  Here the high brachial vein was dissected free.  A 4-7 mm PTFE graft was tunneled between the 2 incisions and the patient was heparinized.  The brachial artery was clamped proximally and distally and a longitudinal arteriotomy was made.  A segment of the 4 mm into the graft was excised, the graft slightly spatulated and sewn into side to the artery using continuous 6-0 Prolene suture.  The graft then pulled the appropriate length for anastomosis to the high brachial vein.  The vein was ligated distally and spatulated proximally.  The graft cut the appropriate length, spatulated and sewn into into the vein using 2 continuous 6-0 Prolene sutures.  At the completion there was an excellent thrill in the graft and a palpable radial pulse.   Hemostasis was obtained in the wounds in each of the wounds was closed with a deep layer of 3-0 Vicryl and the skin closed with 4-0 Vicryl.  Dermabond was applied.  The patient tolerated the procedure well and was transferred to the recovery room in stable condition.  All needle and sponge counts were correct. [name], MD, FACS [facility]  DATE OF DICTATION:   05/19/2019

## 2019-05-19 NOTE — Anesthesia Postprocedure Evaluation (Signed)
Anesthesia Post Note  Patient: Jeremy Johnson  Procedure(s) Performed: CONVERSION OF LEFT ARM ARTERIOVENOUS FISTULA TO GRAFT (Left Arm Upper)     Patient location during evaluation: PACU Anesthesia Type: MAC Level of consciousness: awake and alert Pain management: pain level controlled Vital Signs Assessment: post-procedure vital signs reviewed and stable Respiratory status: spontaneous breathing, nonlabored ventilation, respiratory function stable and patient connected to nasal cannula oxygen Cardiovascular status: stable and blood pressure returned to baseline Postop Assessment: no apparent nausea or vomiting Anesthetic complications: no    Last Vitals:  Vitals:   05/19/19 0601 05/19/19 0930  BP: (!) 158/84 121/71  Pulse: 66 77  Resp: 18 10  Temp: 36.6 C (!) 36.2 C  SpO2: 100% 98%    Last Pain:  Vitals:   05/19/19 0930  TempSrc:   PainSc: 0-No pain                 Dylan Monforte S

## 2019-05-20 ENCOUNTER — Encounter (HOSPITAL_COMMUNITY): Payer: Self-pay | Admitting: Vascular Surgery

## 2019-05-20 DIAGNOSIS — N186 End stage renal disease: Secondary | ICD-10-CM | POA: Diagnosis not present

## 2019-05-20 DIAGNOSIS — K5900 Constipation, unspecified: Secondary | ICD-10-CM | POA: Diagnosis not present

## 2019-05-20 LAB — POCT I-STAT 4, (NA,K, GLUC, HGB,HCT)
Glucose, Bld: 105 mg/dL — ABNORMAL HIGH (ref 70–99)
HCT: 19 % — ABNORMAL LOW (ref 39.0–52.0)
Hemoglobin: 6.5 g/dL — CL (ref 13.0–17.0)
Potassium: 3.6 mmol/L (ref 3.5–5.1)
Sodium: 132 mmol/L — ABNORMAL LOW (ref 135–145)

## 2019-05-20 MED FILL — Thrombin (Recombinant) For Soln 20000 Unit: CUTANEOUS | Qty: 1 | Status: AC

## 2019-05-21 DIAGNOSIS — N186 End stage renal disease: Secondary | ICD-10-CM | POA: Diagnosis not present

## 2019-05-21 DIAGNOSIS — E876 Hypokalemia: Secondary | ICD-10-CM | POA: Diagnosis not present

## 2019-05-21 DIAGNOSIS — D689 Coagulation defect, unspecified: Secondary | ICD-10-CM | POA: Diagnosis not present

## 2019-05-21 DIAGNOSIS — N2581 Secondary hyperparathyroidism of renal origin: Secondary | ICD-10-CM | POA: Diagnosis not present

## 2019-05-21 DIAGNOSIS — Z23 Encounter for immunization: Secondary | ICD-10-CM | POA: Diagnosis not present

## 2019-05-21 DIAGNOSIS — D631 Anemia in chronic kidney disease: Secondary | ICD-10-CM | POA: Diagnosis not present

## 2019-05-21 DIAGNOSIS — E039 Hypothyroidism, unspecified: Secondary | ICD-10-CM | POA: Diagnosis not present

## 2019-05-22 ENCOUNTER — Other Ambulatory Visit: Payer: Self-pay

## 2019-05-22 ENCOUNTER — Telehealth: Payer: Self-pay | Admitting: *Deleted

## 2019-05-22 ENCOUNTER — Inpatient Hospital Stay: Payer: Medicare Other

## 2019-05-22 ENCOUNTER — Inpatient Hospital Stay (HOSPITAL_BASED_OUTPATIENT_CLINIC_OR_DEPARTMENT_OTHER): Payer: Medicare Other | Admitting: Medical

## 2019-05-22 ENCOUNTER — Encounter (HOSPITAL_COMMUNITY): Payer: Self-pay | Admitting: *Deleted

## 2019-05-22 ENCOUNTER — Telehealth: Payer: Self-pay | Admitting: Internal Medicine

## 2019-05-22 ENCOUNTER — Inpatient Hospital Stay (HOSPITAL_COMMUNITY)
Admission: EM | Admit: 2019-05-22 | Discharge: 2019-05-25 | DRG: 264 | Disposition: A | Discharge status: Home / Self Care | Payer: Medicare Other | Attending: Internal Medicine | Admitting: Internal Medicine

## 2019-05-22 ENCOUNTER — Emergency Department (HOSPITAL_COMMUNITY): Payer: Medicare Other

## 2019-05-22 ENCOUNTER — Other Ambulatory Visit: Payer: Self-pay | Admitting: Emergency Medicine

## 2019-05-22 DIAGNOSIS — M109 Gout, unspecified: Secondary | ICD-10-CM

## 2019-05-22 DIAGNOSIS — K432 Incisional hernia without obstruction or gangrene: Secondary | ICD-10-CM | POA: Diagnosis present

## 2019-05-22 DIAGNOSIS — Z8679 Personal history of other diseases of the circulatory system: Secondary | ICD-10-CM

## 2019-05-22 DIAGNOSIS — K219 Gastro-esophageal reflux disease without esophagitis: Secondary | ICD-10-CM | POA: Diagnosis not present

## 2019-05-22 DIAGNOSIS — D62 Acute posthemorrhagic anemia: Secondary | ICD-10-CM | POA: Diagnosis not present

## 2019-05-22 DIAGNOSIS — R42 Dizziness and giddiness: Secondary | ICD-10-CM | POA: Diagnosis not present

## 2019-05-22 DIAGNOSIS — C3492 Malignant neoplasm of unspecified part of left bronchus or lung: Secondary | ICD-10-CM | POA: Diagnosis present

## 2019-05-22 DIAGNOSIS — Z823 Family history of stroke: Secondary | ICD-10-CM

## 2019-05-22 DIAGNOSIS — C3411 Malignant neoplasm of upper lobe, right bronchus or lung: Secondary | ICD-10-CM

## 2019-05-22 DIAGNOSIS — C3481 Malignant neoplasm of overlapping sites of right bronchus and lung: Secondary | ICD-10-CM

## 2019-05-22 DIAGNOSIS — M5136 Other intervertebral disc degeneration, lumbar region: Secondary | ICD-10-CM | POA: Diagnosis present

## 2019-05-22 DIAGNOSIS — Z880 Allergy status to penicillin: Secondary | ICD-10-CM

## 2019-05-22 DIAGNOSIS — Z923 Personal history of irradiation: Secondary | ICD-10-CM

## 2019-05-22 DIAGNOSIS — Z992 Dependence on renal dialysis: Secondary | ICD-10-CM

## 2019-05-22 DIAGNOSIS — D61818 Other pancytopenia: Secondary | ICD-10-CM | POA: Diagnosis not present

## 2019-05-22 DIAGNOSIS — N189 Chronic kidney disease, unspecified: Secondary | ICD-10-CM | POA: Diagnosis not present

## 2019-05-22 DIAGNOSIS — D631 Anemia in chronic kidney disease: Secondary | ICD-10-CM

## 2019-05-22 DIAGNOSIS — Z955 Presence of coronary angioplasty implant and graft: Secondary | ICD-10-CM

## 2019-05-22 DIAGNOSIS — E039 Hypothyroidism, unspecified: Secondary | ICD-10-CM | POA: Diagnosis present

## 2019-05-22 DIAGNOSIS — N183 Chronic kidney disease, stage 3 unspecified: Secondary | ICD-10-CM

## 2019-05-22 DIAGNOSIS — D709 Neutropenia, unspecified: Secondary | ICD-10-CM | POA: Diagnosis not present

## 2019-05-22 DIAGNOSIS — R7889 Finding of other specified substances, not normally found in blood: Secondary | ICD-10-CM | POA: Diagnosis not present

## 2019-05-22 DIAGNOSIS — T451X5A Adverse effect of antineoplastic and immunosuppressive drugs, initial encounter: Secondary | ICD-10-CM | POA: Diagnosis not present

## 2019-05-22 DIAGNOSIS — I252 Old myocardial infarction: Secondary | ICD-10-CM | POA: Diagnosis not present

## 2019-05-22 DIAGNOSIS — R0602 Shortness of breath: Secondary | ICD-10-CM | POA: Diagnosis not present

## 2019-05-22 DIAGNOSIS — Z7982 Long term (current) use of aspirin: Secondary | ICD-10-CM

## 2019-05-22 DIAGNOSIS — Z9221 Personal history of antineoplastic chemotherapy: Secondary | ICD-10-CM

## 2019-05-22 DIAGNOSIS — I10 Essential (primary) hypertension: Secondary | ICD-10-CM | POA: Diagnosis present

## 2019-05-22 DIAGNOSIS — I953 Hypotension of hemodialysis: Secondary | ICD-10-CM | POA: Diagnosis not present

## 2019-05-22 DIAGNOSIS — T82898A Other specified complication of vascular prosthetic devices, implants and grafts, initial encounter: Secondary | ICD-10-CM | POA: Diagnosis not present

## 2019-05-22 DIAGNOSIS — I2510 Atherosclerotic heart disease of native coronary artery without angina pectoris: Secondary | ICD-10-CM | POA: Diagnosis present

## 2019-05-22 DIAGNOSIS — J449 Chronic obstructive pulmonary disease, unspecified: Secondary | ICD-10-CM

## 2019-05-22 DIAGNOSIS — C3490 Malignant neoplasm of unspecified part of unspecified bronchus or lung: Secondary | ICD-10-CM | POA: Diagnosis present

## 2019-05-22 DIAGNOSIS — N186 End stage renal disease: Secondary | ICD-10-CM | POA: Diagnosis not present

## 2019-05-22 DIAGNOSIS — R5383 Other fatigue: Secondary | ICD-10-CM

## 2019-05-22 DIAGNOSIS — D61810 Antineoplastic chemotherapy induced pancytopenia: Secondary | ICD-10-CM | POA: Diagnosis not present

## 2019-05-22 DIAGNOSIS — N179 Acute kidney failure, unspecified: Secondary | ICD-10-CM | POA: Diagnosis not present

## 2019-05-22 DIAGNOSIS — D696 Thrombocytopenia, unspecified: Secondary | ICD-10-CM

## 2019-05-22 DIAGNOSIS — Z801 Family history of malignant neoplasm of trachea, bronchus and lung: Secondary | ICD-10-CM | POA: Diagnosis not present

## 2019-05-22 DIAGNOSIS — D649 Anemia, unspecified: Secondary | ICD-10-CM

## 2019-05-22 DIAGNOSIS — Y832 Surgical operation with anastomosis, bypass or graft as the cause of abnormal reaction of the patient, or of later complication, without mention of misadventure at the time of the procedure: Secondary | ICD-10-CM | POA: Diagnosis present

## 2019-05-22 DIAGNOSIS — E785 Hyperlipidemia, unspecified: Secondary | ICD-10-CM

## 2019-05-22 DIAGNOSIS — I120 Hypertensive chronic kidney disease with stage 5 chronic kidney disease or end stage renal disease: Secondary | ICD-10-CM

## 2019-05-22 DIAGNOSIS — Y929 Unspecified place or not applicable: Secondary | ICD-10-CM

## 2019-05-22 DIAGNOSIS — Z95828 Presence of other vascular implants and grafts: Secondary | ICD-10-CM

## 2019-05-22 DIAGNOSIS — Z87891 Personal history of nicotine dependence: Secondary | ICD-10-CM

## 2019-05-22 DIAGNOSIS — Z1159 Encounter for screening for other viral diseases: Secondary | ICD-10-CM

## 2019-05-22 DIAGNOSIS — Z79899 Other long term (current) drug therapy: Secondary | ICD-10-CM

## 2019-05-22 DIAGNOSIS — Z79890 Hormone replacement therapy (postmenopausal): Secondary | ICD-10-CM

## 2019-05-22 LAB — CBC WITH DIFFERENTIAL/PLATELET
Abs Immature Granulocytes: 0.11 10*3/uL — ABNORMAL HIGH (ref 0.00–0.07)
Basophils Absolute: 0 10*3/uL (ref 0.0–0.1)
Basophils Relative: 2 %
Eosinophils Absolute: 0 10*3/uL (ref 0.0–0.5)
Eosinophils Relative: 2 %
HCT: 18 % — ABNORMAL LOW (ref 39.0–52.0)
Hemoglobin: 6 g/dL — CL (ref 13.0–17.0)
Immature Granulocytes: 10 %
Lymphocytes Relative: 66 %
Lymphs Abs: 0.8 10*3/uL (ref 0.7–4.0)
MCH: 30.8 pg (ref 26.0–34.0)
MCHC: 33.3 g/dL (ref 30.0–36.0)
MCV: 92.3 fL (ref 80.0–100.0)
Monocytes Absolute: 0.1 10*3/uL (ref 0.1–1.0)
Monocytes Relative: 9 %
Neutro Abs: 0.1 10*3/uL — ABNORMAL LOW (ref 1.7–7.7)
Neutrophils Relative %: 11 %
Platelets: 12 10*3/uL — CL (ref 150–400)
RBC: 1.95 MIL/uL — ABNORMAL LOW (ref 4.22–5.81)
RDW: 15.6 % — ABNORMAL HIGH (ref 11.5–15.5)
WBC: 1.1 10*3/uL — CL (ref 4.0–10.5)
nRBC: 0 % (ref 0.0–0.2)

## 2019-05-22 LAB — CBC WITH DIFFERENTIAL (CANCER CENTER ONLY)
Abs Immature Granulocytes: 0 10*3/uL (ref 0.00–0.07)
Basophils Absolute: 0 10*3/uL (ref 0.0–0.1)
Basophils Relative: 2 %
Eosinophils Absolute: 0 10*3/uL (ref 0.0–0.5)
Eosinophils Relative: 2 %
HCT: 17.6 % — ABNORMAL LOW (ref 39.0–52.0)
Hemoglobin: 5.8 g/dL — CL (ref 13.0–17.0)
Immature Granulocytes: 0 %
Lymphocytes Relative: 71 %
Lymphs Abs: 0.9 10*3/uL (ref 0.7–4.0)
MCH: 30.2 pg (ref 26.0–34.0)
MCHC: 33 g/dL (ref 30.0–36.0)
MCV: 91.7 fL (ref 80.0–100.0)
Monocytes Absolute: 0.2 10*3/uL (ref 0.1–1.0)
Monocytes Relative: 11 %
Neutro Abs: 0.2 10*3/uL — CL (ref 1.7–7.7)
Neutrophils Relative %: 14 %
Platelet Count: 12 10*3/uL — ABNORMAL LOW (ref 150–400)
RBC: 1.92 MIL/uL — ABNORMAL LOW (ref 4.22–5.81)
RDW: 15.7 % — ABNORMAL HIGH (ref 11.5–15.5)
WBC Count: 1.3 10*3/uL — ABNORMAL LOW (ref 4.0–10.5)
nRBC: 0 % (ref 0.0–0.2)

## 2019-05-22 LAB — COMPREHENSIVE METABOLIC PANEL
ALT: 11 U/L (ref 0–44)
AST: 13 U/L — ABNORMAL LOW (ref 15–41)
Albumin: 2.7 g/dL — ABNORMAL LOW (ref 3.5–5.0)
Alkaline Phosphatase: 92 U/L (ref 38–126)
Anion gap: 13 (ref 5–15)
BUN: 33 mg/dL — ABNORMAL HIGH (ref 8–23)
CO2: 25 mmol/L (ref 22–32)
Calcium: 8.3 mg/dL — ABNORMAL LOW (ref 8.9–10.3)
Chloride: 96 mmol/L — ABNORMAL LOW (ref 98–111)
Creatinine, Ser: 7.04 mg/dL — ABNORMAL HIGH (ref 0.61–1.24)
GFR calc Af Amer: 8 mL/min — ABNORMAL LOW (ref >60)
GFR calc non Af Amer: 7 mL/min — ABNORMAL LOW (ref >60)
Glucose, Bld: 119 mg/dL — ABNORMAL HIGH (ref 70–99)
Potassium: 3.8 mmol/L (ref 3.5–5.1)
Sodium: 134 mmol/L — ABNORMAL LOW (ref 135–145)
Total Bilirubin: 0.8 mg/dL (ref 0.3–1.2)
Total Protein: 6 g/dL — ABNORMAL LOW (ref 6.5–8.1)

## 2019-05-22 LAB — URINALYSIS, ROUTINE W REFLEX MICROSCOPIC
Bilirubin Urine: NEGATIVE
Glucose, UA: 150 mg/dL — AB
Ketones, ur: NEGATIVE mg/dL
Leukocytes,Ua: NEGATIVE
Nitrite: NEGATIVE
Protein, ur: >=300 mg/dL — AB
RBC / HPF: >50 RBC/hpf — ABNORMAL HIGH (ref 0–5)
Specific Gravity, Urine: 1.022 (ref 1.005–1.030)
pH: 8 (ref 5.0–8.0)

## 2019-05-22 LAB — PROTIME-INR
INR: 1 (ref 0.8–1.2)
Prothrombin Time: 13.4 seconds (ref 11.4–15.2)

## 2019-05-22 LAB — PREPARE RBC (CROSSMATCH)

## 2019-05-22 LAB — LACTIC ACID, PLASMA: Lactic Acid, Venous: 1.6 mmol/L (ref 0.5–1.9)

## 2019-05-22 LAB — APTT: aPTT: 33 seconds (ref 24–36)

## 2019-05-22 LAB — SARS CORONAVIRUS 2 BY RT PCR (HOSPITAL ORDER, PERFORMED IN ~~LOC~~ HOSPITAL LAB): SARS Coronavirus 2: NEGATIVE

## 2019-05-22 MED ORDER — CHLORHEXIDINE GLUCONATE CLOTH 2 % EX PADS
6.0000 | MEDICATED_PAD | Freq: Every day | CUTANEOUS | Status: DC
  Administered 2019-05-25: 6 via TOPICAL

## 2019-05-22 MED ORDER — SODIUM CHLORIDE 0.9 % IV SOLN: 10.0000 mL/h | Freq: Once | INTRAVENOUS | Status: DC

## 2019-05-22 MED ORDER — SODIUM CHLORIDE 0.9 % IV SOLN: INTRAVENOUS | Status: DC

## 2019-05-22 MED ORDER — SODIUM CHLORIDE 0.9% IV SOLUTION
250.0000 mL | Freq: Once | INTRAVENOUS | Status: DC
  Filled 2019-05-22: qty 250

## 2019-05-22 MED ORDER — SODIUM CHLORIDE 0.9% IV SOLUTION
Freq: Once | INTRAVENOUS | Status: AC
  Administered 2019-05-22: 17:00:00 via INTRAVENOUS

## 2019-05-22 MED ORDER — ACETAMINOPHEN 325 MG PO TABS: 650.0000 mg | ORAL_TABLET | Freq: Once | ORAL | Status: DC

## 2019-05-22 MED ORDER — DIPHENHYDRAMINE HCL 25 MG PO CAPS: 25.0000 mg | ORAL_CAPSULE | Freq: Once | ORAL | Status: DC

## 2019-05-22 NOTE — H&P (Signed)
History and Physical    IRVINE GLORIOSO TKP:[id number] DOB: [birth year] DOA: 05/22/2019  PCP: [name], MD  Patient coming from: Cancer center  I have personally briefly reviewed patient's old medical records in [place]  Chief Complaint: Shortness of breath, anemia  HPI: Jeremy Johnson is a 73 y.o. male with medical history significant of small cell lung cancer on active chemotherapy, ESRD on HD (TTS), gout, essential hypertension, hypothyroidism, GERD, HLD who presents from the cancer center with weakness, fatigue.  He is found to have pancytopenia with a white blood cell count of 1.1, hemoglobin 6.0, platelet 12.  He has been experiencing weakness, fatigue and overall malaise over the past week.  He is currently on chemotherapy with carboplatin, etoposide with Neulasta last treatment on 05/14/2019.  Additionally, he has reported passing out during hemodialysis on several past occasions.  No other specific complaints at this time.  Of note, patient recently had left upper extremity AV graft placed on 05/19/2019 by vascular surgery, Dr. Scot Dock.  Shortly following procedure, patient has complained of progressive left upper extremity edema from his graft site proceeding distally to his hand with associated ecchymosis.  Patient also reports missed HD session on Tuesday, but had full session Wednesday/yesterday and was planning on getting back on his normal schedule today but missed hemodialysis since he was at the cancer center.  ED Course: Temperature 97.8, HR 79, RR 14, BP 118/70, SPO2 99% on room air.  WBC count 1.1, hemoglobin 6.0, platelets 12.  Sodium 134, potassium 3.8, chloride 96, CO2 25, BUN 33, creatinine 7.04, glucose 119.  Anion gap 13.  Albumin 2.7.  INR 1.0.  Calcium 8.3.  Chest x-ray with no acute findings, mild COPD changes noted.  ED provider ordered 1 unit of platelets and 2 units PRBCs for transfusion.  Hospital service was consulted for admission given severe pancytopenia  and concern of left upper extremity edema with recent AV graft placement as possible site of acute blood loss anemia.  Review of Systems: As per HPI otherwise 10 point review of systems negative.   Past Medical History:  Diagnosis Date  . Anemia   . Blood transfusion without reported diagnosis   . CAD (coronary artery disease)    STENT... MID CIRCUMFLEX...1997  . Cancer [facility])    Lung cancer  . Chronic kidney disease    STAGE 3  . COPD (chronic obstructive pulmonary disease) ([place])   . Degenerative joint disease (DJD) of lumbar spine   . GERD (gastroesophageal reflux disease)   . Gout   . Hyperlipidemia   . Hypertension   . Hypothyroidism   . Incisional hernia    abdomen  . Leukocytosis    CHRONIC MILD  . Myocardial infarction ([place])    1997    Past Surgical History:  Procedure Laterality Date  . ABDOMINAL AORTIC ANEURYSM REPAIR  2006  . AV FISTULA PLACEMENT Left 04/25/2019   Procedure: ARTERIOVENOUS (AV) FISTULA CREATION LEFT ARM;  Surgeon: Angelia Mould, MD;  Location: [facility];  Service: Vascular;  Laterality: Left;  . AV FISTULA PLACEMENT Left 05/19/2019   Procedure: CONVERSION OF LEFT ARM ARTERIOVENOUS FISTULA TO GRAFT;  Surgeon: Angelia Mould, MD;  Location: [facility];  Service: Vascular;  Laterality: Left;  . BIOPSY  09/30/2018   Procedure: BIOPSY;  Surgeon: Danie Binder, MD;  [facility];  Service: Endoscopy;;  ascending colon  . COLONOSCOPY  2008  . COLONOSCOPY N/A 09/30/2018   Procedure: COLONOSCOPY;  Surgeon: Danie Binder, MD;  [facility];  Service: Endoscopy;  Laterality: N/A;  9:00  . CORONARY ANGIOPLASTY WITH STENT [place]  . IR FLUORO GUIDE CV LINE RIGHT  04/22/2019  . IR US GUIDE VASC ACCESS RIGHT  04/22/2019  . POLYPECTOMY  09/30/2018   Procedure: POLYPECTOMY;  Surgeon: Danie Binder, MD;  [facility];  Service: Endoscopy;;  colon  . VIDEO BRONCHOSCOPY WITH ENDOBRONCHIAL NAVIGATION  N/A 01/27/2019   Procedure: VIDEO BRONCHOSCOPY WITH ENDOBRONCHIAL NAVIGATION;  Surgeon: Grace Isaac, MD;  Location: [facility];  Service: Thoracic;  Laterality: N/A;  . VIDEO BRONCHOSCOPY WITH ENDOBRONCHIAL ULTRASOUND N/A 01/27/2019   Procedure: VIDEO BRONCHOSCOPY WITH ENDOBRONCHIAL ULTRASOUND;  Surgeon: Grace Isaac, MD;  Location: [facility];  Service: Thoracic;  Laterality: N/A;     reports that he quit smoking about 20 months ago. He has a 27.00 pack-year smoking history. He has never used smokeless tobacco. He reports current alcohol use. He reports that he does not use drugs.  Allergies  Allergen Reactions  . Penicillins Rash and Other (See Comments)    Has patient had a PCN reaction causing immediate rash, facial/tongue/throat swelling, SOB or lightheadedness with hypotension: No Has patient had a PCN reaction causing severe rash involving mucus membranes or skin necrosis: No Has patient had a PCN reaction that required hospitalization: No Has patient had a PCN reaction occurring within the last 10 years: No If all of the above answers are "NO", then may proceed with Cephalosporin use.     Family History  Problem Relation Age of Onset  . Stroke Brother   . Lung cancer Sister 83       lung cancer/former     Prior to Admission medications   Medication Sig Start Date End Date Taking? Authorizing Provider  allopurinol (ZYLOPRIM) 100 MG tablet Take 1 tablet (100 mg total) by mouth daily. 04/26/19  Yes Mercy Riding, MD  amLODipine (NORVASC) 5 MG tablet Take 5 mg by mouth daily.  03/12/18  Yes [provider]  aspirin EC 81 MG tablet Take 81 mg by mouth daily.   Yes [provider]  Darbepoetin Alfa (ARANESP) 100 MCG/0.5ML SOSY injection Inject 0.5 mLs (100 mcg total) into the vein every Thursday with hemodialysis. 05/01/19  Yes Mercy Riding, MD  levothyroxine (SYNTHROID, LEVOTHROID) 175 MCG tablet Take 175 mcg by mouth daily before breakfast.   Yes [provider]  metoprolol tartrate (LOPRESSOR) 50 MG tablet Take 25 mg by mouth 2 (two) times daily.    Yes [provider]  omeprazole (PRILOSEC) 20 MG capsule Take 20 mg by mouth daily.   Yes [provider]  sevelamer carbonate (RENVELA) 800 MG tablet Take 2 tablets (1,600 mg total) by mouth 3 (three) times daily with meals. 04/26/19  Yes Mercy Riding, MD  simvastatin (ZOCOR) 20 MG tablet Take 20 mg by mouth at bedtime.  03/12/18  Yes [provider]  sucralfate (CARAFATE) 1 g tablet Take 1 tablet (1 g total) by mouth 4 (four) times daily -  with meals and at bedtime. 5 min before meals for radiation induced esophagitis Patient taking differently: Take 1 g by mouth 3 (three) times daily as needed (stomach irritation). 5 min before meals for radiation induced esophagitis 04/11/19  Yes Tyler Pita, MD    Physical Exam: Vitals:   05/22/19 1530 05/22/19 1630 05/22/19 1650 05/22/19 1702  BP: 118/70 122/70 122/70 [name])  141/80  Pulse: 79  85 84  Resp: 14 19 17  (!) 23  Temp:   98.3 F (36.8 C) 98.5 F (36.9 C)  TempSrc:   Oral Oral  SpO2: 99%  100% 99%  Weight:      Height:        Constitutional: NAD, calm, comfortable Vitals:   05/22/19 1530 05/22/19 1630 05/22/19 1650 05/22/19 1702  BP: 118/70 122/70 122/70 (!) 141/80  Pulse: 79  85 84  Resp: 14 19 17  (!) 23  Temp:   98.3 F (36.8 C) 98.5 F (36.9 C)  TempSrc:   Oral Oral  SpO2: 99%  100% 99%  Weight:      Height:       Eyes: PERRL, lids normal, pale conjunctivae ENMT: Mucous membranes are moist. Posterior pharynx clear of any exudate or lesions.Normal dentition.  Neck: normal, supple, no masses, no thyromegaly Respiratory: clear to auscultation bilaterally, no wheezing, no crackles. Normal respiratory effort. No accessory muscle use.  Cardiovascular: Regular rate and rhythm, no murmurs / rubs / gallops. No extremity edema. 2+ pedal pulses. No carotid bruits.  Abdomen: no tenderness, no masses  palpated. No hepatosplenomegaly. Bowel sounds positive.  Musculoskeletal: no clubbing / cyanosis. No joint deformity upper and lower extremities. Good ROM, no contractures. Normal muscle tone.  Skin: Significant ecchymosis surrounding LUE AV graft site with pitting edema to left hand Neurologic: CN 2-12 grossly intact. Sensation intact, DTR normal. Strength 5/5 in all 4.  Psychiatric: Normal judgment and insight. Alert and oriented x 3. Normal mood.    Labs on Admission: I have personally reviewed following labs and imaging studies  CBC: Recent Labs  Lab 05/19/19 0704 05/22/19 1232 05/22/19 1418  WBC  --  1.3* 1.1*  NEUTROABS  --  0.2* 0.1*  HGB 6.5* 5.8* 6.0*  HCT 19.0* 17.6* 18.0*  MCV  --  91.7 92.3  PLT  --  12* 12*   Basic Metabolic Panel: Recent Labs  Lab 05/19/19 0704 05/22/19 1418  NA 132* 134*  K 3.6 3.8  CL  --  96*  CO2  --  25  GLUCOSE 105* 119*  BUN  --  33*  CREATININE  --  7.04*  CALCIUM  --  8.3*   GFR: Estimated Creatinine Clearance: 10.4 mL/min (A) (by C-G formula based on SCr of 7.04 mg/dL (H)). Liver Function Tests: Recent Labs  Lab 05/22/19 1418  AST 13*  ALT 11  ALKPHOS 92  BILITOT 0.8  PROT 6.0*  ALBUMIN 2.7*   No results for input(s): LIPASE, AMYLASE in the last 168 hours. No results for input(s): AMMONIA in the last 168 hours. Coagulation Profile: Recent Labs  Lab 05/22/19 1604  INR 1.0   Cardiac Enzymes: No results for input(s): CKTOTAL, CKMB, CKMBINDEX, TROPONINI in the last 168 hours. BNP (last 3 results) No results for input(s): PROBNP in the last 8760 hours. HbA1C: No results for input(s): HGBA1C in the last 72 hours. CBG: No results for input(s): GLUCAP in the last 168 hours. Lipid Profile: No results for input(s): CHOL, HDL, LDLCALC, TRIG, CHOLHDL, LDLDIRECT in the last 72 hours. Thyroid Function Tests: No results for input(s): TSH, T4TOTAL, FREET4, T3FREE, THYROIDAB in the last 72 hours. Anemia Panel: No results  for input(s): VITAMINB12, FOLATE, FERRITIN, TIBC, IRON, RETICCTPCT in the last 72 hours. Urine analysis:    Component Value Date/Time   COLORURINE STRAW (A) 04/15/2019 0913   APPEARANCEUR CLEAR 04/15/2019 0913   LABSPEC 1.010 04/15/2019 0913  PHURINE 7.0 04/15/2019 0913   GLUCOSEU 50 (A) 04/15/2019 0913   HGBUR MODERATE (A) 04/15/2019 0913   BILIRUBINUR NEGATIVE 04/15/2019 0913   KETONESUR NEGATIVE 04/15/2019 0913   PROTEINUR >=300 (A) 04/15/2019 0913   NITRITE NEGATIVE 04/15/2019 0913   LEUKOCYTESUR NEGATIVE 04/15/2019 0913    Radiological Exams on Admission: Dg Chest Portable 1 View  Result Date: 05/22/2019 CLINICAL DATA:  Shortness of breath. EXAM: PORTABLE CHEST 1 VIEW COMPARISON:  04/14/2019. FINDINGS: Normal sized heart. Tortuous aorta. Clear lungs with stable biapical pleural and parenchymal scarring. The lungs are mildly hyperexpanded. Interval right jugular catheter with its tip at the superior cavoatrial junction. No pneumothorax. Thoracic spine degenerative changes. IMPRESSION: No acute abnormality. Mild changes of COPD. Electronically Signed   By: Claudie Revering M.D.   On: 05/22/2019 15:49    EKG: Independently reviewed. NSR  Assessment/Plan Active Problems:   Pancytopenia ([place])  Pancytopenia  Patient presenting from the cancer center with profound pancytopenia with white blood cell count 1.1, hemoglobin 6.0, platelet count 12.  No signs of active blood loss, although slightly concerned at left upper extremity AV graft site with edema and ecchymosis.  Most recent CBC on 05/12/2019 with WBC count 8.7, hemoglobin 8.7, and platelet 279.  Currently on chemotherapy for his small cell lung cancer with carboplatin, etoposide and receiving Neulasta; last treatment on 05/14/2019.  Etiology likely chemotherapy induced.  Also considerations for immune induced, nutritional deficiency, infectious, DIC, HUS, HL H, TTP --Transfusion 2 units PRBCs and 1 unit platelets ordered by  EDP --Peripheral blood smear, reticulocyte count, iron, TIBC, ferritin, B12, folate, PTT, uric acid, fibrinogen, d-dimer ordered --Oncology, Dr. Julien Nordmann tagged the chart for evaluation and further recommendations --Neutropenic precautions --Repeat CBC with differential in the a.m. --Holding home aspirin --Transfuse for hemoglobin less than 7.0 and platelets less than 10 or if active bleeding  Small cell lung cancer active on chemotherapy Patient is followed by medical oncology, Dr. Julien Nordmann.  Currently on chemotherapy with carboplatin, etoposide and Neulasta with last treatment on 05/14/2019.  Has completed cycle 4-day 3.  Concern that his pancytopenia is likely chemotherapy induced.  Case was discussed by ED provider with on-call oncology, Dr. Jana Hakim who will notify his primary oncologist regarding his admission --Oncology, Dr. Julien Nordmann tagged the chart for evaluation and further recommendations  ESRD on HD (TTS) Patient follows in [facility] hemod[facility] center.  Reports misses HD session on Tuesday but had a full session Wednesday, 05/21/2019.  Was post to have another session today on 05/22/2019 to get back on his normal schedule but missed since he was at the cancer center. --Hemodynamically stable, oxygenating 99% on room air, potassium 3.8 --Nephrology consulted, Dr. Royce Macadamia; likely plan HD tomorrow  Left upper extremity edema and ecchymosis Recent AV graft placed left upper extremity with notable edema from AV graft site to left hand with surrounding ecchymosis. --Ordered vascular US duplex left upper extremity for further evaluation of AV graft --Vascular surgery consulted, Dr. Donzetta Matters for evaluation of AV graft site  Essential hypertension Patient on amlodipine 5 mg p.o. daily, and Lopressor 25 mg p.o. twice daily.  Blood pressure 118/70 in ED, patient states has not taken his blood pressure medicines over the past few days.  He reports has had "passing out spells" during dialysis due to low  blood pressures. --Continue to hold home antihypertensives --Closely monitor blood pressure  Hypothyroidism --Check TSH --Continue levothyroxine 175 mcg p.o. daily  GERD: Continue PPI  Hyperlipidemia: Continue statin  DVT prophylaxis: SCDs, chemical DVT  prophylaxis contraindicated in the setting of pancytopenia Code Status: Full code Family Communication: Spouse, Inez Catalina updated via telephone Disposition Plan: Anticipate discharge home when medically ready Consults called: Nephrology, Dr. Royce Macadamia; vascular surgery, Dr. Donzetta Matters; Oncology, Dr. Julien Nordmann tagged to chart Admission status: Inpatient, telemetry  Severity of Illness: The appropriate patient status for this patient is INPATIENT. Inpatient status is judged to be reasonable and necessary in order to provide the required intensity of service to ensure the patient's safety. The patient's presenting symptoms, physical exam findings, and initial radiographic and laboratory data in the context of their chronic comorbidities is felt to place them at high risk for further clinical deterioration. Furthermore, it is not anticipated that the patient will be medically stable for discharge from the hospital within 2 midnights of admission. The following factors support the patient status of inpatient.   " The patient's presenting symptoms include fatigue, weakness, generalized malaise, shortness of breath " The worrisome physical exam findings include pale conjunctiva, ecchymosis and edema to left upper extremity with recent AV graft placement " The initial radiographic and laboratory data are worrisome because of pancytopenia, " The chronic co-morbidities include small cell lung cancer active on chemotherapy, ESRD on HD, essential hypertension, hypothyroidism, GERD, hyperlipidemia, gout..   * I certify that at the point of admission it is my clinical judgment that the patient will require inpatient hospital care spanning beyond 2 midnights from the  point of admission due to high intensity of service, high risk for further deterioration and high frequency of surveillance required.*  Eric J [place] DO [facility] Pager [phone]  If 7PM-7AM, please contact night-coverage [email or web address] Password [email or web address]  05/22/2019, 5:26 PM

## 2019-05-22 NOTE — ED Notes (Signed)
ED TO INPATIENT HANDOFF REPORT  ED Nurse Name and Phone #: Annie Main [phone]  S Name/Age/Gender Jeremy Johnson 73 y.o. male Room/Bed: 057C/057C  Code Status   Code Status: Prior  Home/SNF/Other Home Patient oriented to: self, place, time and situation Is this baseline? Yes   Triage Complete: Triage complete  Chief Complaint Abnormal Labs  Triage Note PT sent from Cancer center because of HGB 5.8 . Pt reported he was short of breath with movement and a little dizzy the patient had a AV graft placed with a large bruise as a concern for blood loss.  PT goes to dialysis  T, W , T   Allergies Allergies  Allergen Reactions  . Penicillins Rash and Other (See Comments)    Has patient had a PCN reaction causing immediate rash, facial/tongue/throat swelling, SOB or lightheadedness with hypotension: No Has patient had a PCN reaction causing severe rash involving mucus membranes or skin necrosis: No Has patient had a PCN reaction that required hospitalization: No Has patient had a PCN reaction occurring within the last 10 years: No If all of the above answers are "NO", then may proceed with Cephalosporin use.     Level of Care/Admitting Diagnosis ED Disposition    ED Disposition Condition [place] [facility]: [place] [[id number]]  Level of Care: Telemetry Medical [104]  Covid Evaluation: Asymptomatic Screening Protocol (No Symptoms)  Diagnosis: Pancytopenia [facility]) [[id number]]  Admitting Physician: [place] ERIC J [[id number]]  Attending Physician: [place] ERIC J [[id number]]  Estimated length of stay: past midnight tomorrow  Certification:: I certify this patient will need inpatient services for at least 2 midnights  PT Class (Do Not Modify): Inpatient [101]  PT Acc Code (Do Not Modify): Private [1]       B Medical/Surgery History Past Medical History:  Diagnosis Date  . Anemia   . Blood transfusion without reported diagnosis   . CAD (coronary artery  disease)    STENT... MID CIRCUMFLEX...1997  . Cancer [facility])    Lung cancer  . Chronic kidney disease    STAGE 3  . COPD (chronic obstructive pulmonary disease) ([place])   . Degenerative joint disease (DJD) of lumbar spine   . GERD (gastroesophageal reflux disease)   . Gout   . Hyperlipidemia   . Hypertension   . Hypothyroidism   . Incisional hernia    abdomen  . Leukocytosis    CHRONIC MILD  . Myocardial infarction ([place])    1997   Past Surgical History:  Procedure Laterality Date  . ABDOMINAL AORTIC ANEURYSM REPAIR  2006  . AV FISTULA PLACEMENT Left 04/25/2019   Procedure: ARTERIOVENOUS (AV) FISTULA CREATION LEFT ARM;  Surgeon: Angelia Mould, MD;  Location: [place];  Service: Vascular;  Laterality: Left;  . AV FISTULA PLACEMENT Left 05/19/2019   Procedure: CONVERSION OF LEFT ARM ARTERIOVENOUS FISTULA TO GRAFT;  Surgeon: Angelia Mould, MD;  Location: [place];  Service: Vascular;  Laterality: Left;  . BIOPSY  09/30/2018   Procedure: BIOPSY;  Surgeon: Danie Binder, MD;  [facility];  Service: Endoscopy;;  ascending colon  . COLONOSCOPY  2008  . COLONOSCOPY N/A 09/30/2018   Procedure: COLONOSCOPY;  Surgeon: Danie Binder, MD;  [facility];  Service: Endoscopy;  Laterality: N/A;  9:00  . CORONARY ANGIOPLASTY WITH STENT [place]  . IR FLUORO GUIDE CV LINE RIGHT  04/22/2019  . IR US GUIDE VASC ACCESS  RIGHT  04/22/2019  . POLYPECTOMY  09/30/2018   Procedure: POLYPECTOMY;  Surgeon: Danie Binder, MD;  [facility];  Service: Endoscopy;;  colon  . VIDEO BRONCHOSCOPY WITH ENDOBRONCHIAL NAVIGATION N/A 01/27/2019   Procedure: VIDEO BRONCHOSCOPY WITH ENDOBRONCHIAL NAVIGATION;  Surgeon: Grace Isaac, MD;  Location: [place];  Service: Thoracic;  Laterality: N/A;  . VIDEO BRONCHOSCOPY WITH ENDOBRONCHIAL ULTRASOUND N/A 01/27/2019   Procedure: VIDEO BRONCHOSCOPY WITH ENDOBRONCHIAL ULTRASOUND;  Surgeon: Grace Isaac,  MD;  Location: [place];  Service: Thoracic;  Laterality: N/A;     A IV Location/Drains/Wounds Patient Lines/Drains/Airways Status   Active Line/Drains/Airways    Name:   Placement date:   Placement time:   Site:   Days:   Peripheral IV 05/22/19 Right Hand   05/22/19    1522    Hand   less than 1   Peripheral IV 05/22/19 Right Antecubital   05/22/19    1701    Antecubital   less than 1   Fistula / Graft Left Upper arm Arteriovenous vein graft   04/25/19    1603    Upper arm   27   Hemodialysis Catheter Right Internal jugular Double lumen Permanent   04/22/19    1322    Internal jugular   30   Incision (Closed) 01/27/19 N/A Other (Comment)   01/27/19    0726     115   Incision (Closed) 04/22/19 Neck Right   04/22/19    1333     30   Incision (Closed) 04/22/19 Chest Right   04/22/19    1334     30   Incision (Closed) 04/25/19 Arm Left   04/25/19    1254     27   Incision (Closed) 05/19/19 Arm Left   05/19/19    0715     3          Intake/Output Last 24 hours No intake or output data in the 24 hours ending 05/22/19 1749  Labs/Imaging Results for orders placed or performed during the hospital encounter of 05/22/19 (from the past 48 hour(s))  CBC with Differential/Platelet     Status: Abnormal   Collection Time: 05/22/19  2:18 PM  Result Value Ref Range   WBC 1.1 (LL) 4.0 - 10.5 K/uL    Comment: REPEATED TO VERIFY WHITE COUNT CONFIRMED ON SMEAR THIS CRITICAL RESULT HAS VERIFIED AND BEEN CALLED TO K BROWN RN BY KIRSTENE FORSYTH ON 07 09 2020 AT 6433, AND HAS BEEN READ BACK.     RBC 1.95 (L) 4.22 - 5.81 MIL/uL   Hemoglobin 6.0 (LL) 13.0 - 17.0 g/dL    Comment: REPEATED TO VERIFY THIS CRITICAL RESULT HAS VERIFIED AND BEEN CALLED TO T MORRIS RN BY KIRSTENE FORSYTH ON 07 09 2020 AT 1512, AND HAS BEEN READ BACK.     HCT 18.0 (L) 39.0 - 52.0 %   MCV 92.3 80.0 - 100.0 fL   MCH 30.8 26.0 - 34.0 pg   MCHC 33.3 30.0 - 36.0 g/dL   RDW 15.6 (H) 11.5 - 15.5 %   Platelets 12 (LL) 150 - 400 K/uL     Comment: REPEATED TO VERIFY PLATELET COUNT CONFIRMED BY SMEAR Immature Platelet Fraction may be clinically indicated, consider ordering this additional test [id number] THIS CRITICAL RESULT HAS VERIFIED AND BEEN CALLED TO K BROWN RN BY KIRSTENE FORSYTH ON 07 09 2020 AT 6606, AND HAS BEEN READ BACK.     nRBC 0.0 0.0 -  0.2 %   Neutrophils Relative % 11 %   Neutro Abs 0.1 (L) 1.7 - 7.7 K/uL   Lymphocytes Relative 66 %   Lymphs Abs 0.8 0.7 - 4.0 K/uL   Monocytes Relative 9 %   Monocytes Absolute 0.1 0.1 - 1.0 K/uL   Eosinophils Relative 2 %   Eosinophils Absolute 0.0 0.0 - 0.5 K/uL   Basophils Relative 2 %   Basophils Absolute 0.0 0.0 - 0.1 K/uL   Immature Granulocytes 10 %   Abs Immature Granulocytes 0.11 (H) 0.00 - 0.07 K/uL    Comment: Performed at [facility] [address] [place], [place] [address]  Comprehensive metabolic panel     Status: Abnormal   Collection Time: 05/22/19  2:18 PM  Result Value Ref Range   Sodium 134 (L) 135 - 145 mmol/L   Potassium 3.8 3.5 - 5.1 mmol/L   Chloride 96 (L) 98 - 111 mmol/L   CO2 25 22 - 32 mmol/L   Glucose, Bld 119 (H) 70 - 99 mg/dL   BUN 33 (H) 8 - 23 mg/dL   Creatinine, Ser 7.04 (H) 0.61 - 1.24 mg/dL   Calcium 8.3 (L) 8.9 - 10.3 mg/dL   Total Protein 6.0 (L) 6.5 - 8.1 g/dL   Albumin 2.7 (L) 3.5 - 5.0 g/dL   AST 13 (L) 15 - 41 U/L   ALT 11 0 - 44 U/L   Alkaline Phosphatase 92 38 - 126 U/L   Total Bilirubin 0.8 0.3 - 1.2 mg/dL   GFR calc non Af Amer 7 (L) >60 mL/min   GFR calc Af Amer 8 (L) >60 mL/min   Anion gap 13 5 - 15    Comment: Performed at [facility], [place] [address], [place], [place] [address]  Type and screen     Status: None (Preliminary result)   Collection Time: 05/22/19  2:29 PM  Result Value Ref Range   ABO/RH(D) O POS    Antibody Screen NEG    Sample Expiration 05/25/2019,2359    Unit Number [id number]    Blood Component Type RED CELLS,LR    Unit division 00    Status of Unit ISSUED     Transfusion Status OK TO TRANSFUSE    Crossmatch Result      Compatible Performed at [facility], [place] [address] [place], [place] [address]    Unit Number [id number]    Blood Component Type RBC LR PHER1    Unit division 00    Status of Unit ALLOCATED    Transfusion Status OK TO TRANSFUSE    Crossmatch Result Compatible   Lactic acid, plasma     Status: None   Collection Time: 05/22/19  3:59 PM  Result Value Ref Range   Lactic Acid, Venous 1.6 0.5 - 1.9 mmol/L    Comment: Performed at [facility], [place] [address], [place], [place] [address]  Prepare RBC     Status: None   Collection Time: 05/22/19  4:00 PM  Result Value Ref Range   Order Confirmation      ORDER PROCESSED BY BLOOD BANK Performed at [place] [facility], [place] [address], [place], [place] [address]   PTT     Status: None   Collection Time: 05/22/19  4:04 PM  Result Value Ref Range   aPTT 33 24 - 36 seconds    Comment: Performed at [facility] [address] [place],  [address]  Protime-INR     Status: None   Collection Time: 05/22/19  4:04 PM  Result Value Ref Range   Prothrombin Time 13.4 11.4 - 15.2 seconds   INR 1.0 0.8 - 1.2    Comment: (NOTE) INR goal varies based on device and disease states. Performed at [facility], [place] [address], [place],  [address]   Prepare Pheresed Platelets     Status: None (Preliminary result)   Collection Time: 05/22/19  4:18 PM  Result Value Ref Range   Unit Number [id number]    Blood Component Type PLTP LR2 PAS    Unit division 00    Status of Unit ALLOCATED    Transfusion Status OK TO TRANSFUSE    Dg Chest Portable 1 View  Result Date: 05/22/2019 CLINICAL DATA:  Shortness of breath. EXAM: PORTABLE CHEST 1 VIEW COMPARISON:  04/14/2019. FINDINGS: Normal sized heart. Tortuous aorta. Clear lungs with stable biapical pleural and parenchymal scarring. The lungs are mildly hyperexpanded. Interval right jugular catheter with its tip  at the superior cavoatrial junction. No pneumothorax. Thoracic spine degenerative changes. IMPRESSION: No acute abnormality. Mild changes of COPD. Electronically Signed   By: Claudie Revering M.D.   On: 05/22/2019 15:49    Pending Labs Unresulted Labs (From admission, onward)    Start     Ordered   05/22/19 1515  Blood culture (routine x 2)  BLOOD CULTURE X 2,   STAT     05/22/19 1514   05/22/19 1515  Urinalysis, Routine w reflex microscopic  Once,   STAT     05/22/19 1514   05/22/19 1514  Lactic acid, plasma  Now then every 2 hours,   STAT     05/22/19 1514   Signed and Held  CBC with Differential/Platelet  Daily,   R     Signed and Held   Signed and Held  Basic metabolic panel  Daily,   R     Signed and Held   Signed and Held  Magnesium  Tomorrow morning,   R     Signed and Held   Signed and Held  SARS Coronavirus 2 (CEPHEID - Performed in [place] hospital lab), [employer]  Once,   R    Question:  Rule Out  Answer:  Yes   Signed and Held   Signed and Held  Save Smear  Once,   R     Signed and Held   Visual merchandiser and Held  Miscellaneous test - Anatomic Pathology  Once,   R    Comments: Pathology smear review   Signed and Held   Visual merchandiser and Held  Reticulocytes  Once,   R     Signed and Held   Signed and Held  APTT  Once,   R     Signed and Held   Signed and Held  Lactate dehydrogenase  Once,   R     Signed and Held   Visual merchandiser and Held  Uric acid  Once,   R     Signed and Held   Visual merchandiser and Held  Folate RBC  Once,   R     Signed and Held   Visual merchandiser and Held  Vitamin B12  Once,   R     Signed and Held   Signed and Held  Ferritin  Once,   R     Signed and Held   Signed and Held  Iron and TIBC  Once,   R  Signed and Held   Signed and Held  Fibrinogen  Once,   R     Signed and Held   Signed and Held  D-dimer, quantitative (not at [facility])  Once,   R     Signed and Held          Vitals/Pain Today's Vitals   05/22/19 1530 05/22/19 1630 05/22/19 1650 05/22/19 1702  BP: 118/70 122/70  122/70 (!) 141/80  Pulse: 79  85 84  Resp: 14 19 17  (!) 23  Temp:   98.3 F (36.8 C) 98.5 F (36.9 C)  TempSrc:   Oral Oral  SpO2: 99%  100% 99%  Weight:      Height:      PainSc:        Isolation Precautions No active isolations  Medications Medications  0.9 %  sodium chloride infusion ( Intravenous Canceled Entry 05/22/19 1656)  0.9 %  sodium chloride infusion ( Intravenous Canceled Entry 05/22/19 1657)  0.9 %  sodium chloride infusion (Manually program via Guardrails IV Fluids) ( Intravenous New Bag/Given 05/22/19 1657)    Mobility walks Low fall risk   Focused Assessments    R Recommendations: See Admitting Provider Note  Report given to:   Additional Notes:

## 2019-05-22 NOTE — Telephone Encounter (Signed)
Dialysis called and requested blood transfusion for hgb 6.5 on 7/6. Blood transfusion scheduled for today . Pt notified.

## 2019-05-22 NOTE — Telephone Encounter (Signed)
Plts 12k -Van notified.

## 2019-05-22 NOTE — ED Notes (Signed)
Wife Inez [place] [phone]

## 2019-05-22 NOTE — ED Provider Notes (Signed)
[facility] EMERGENCY DEPARTMENT Provider Note   CSN: [id number] Arrival date & time: 05/22/19  1359    History   Chief Complaint Chief Complaint  Patient presents with  . Abnormal Lab    HPI Jeremy Johnson is a 73 y.o. male.     The history is provided by the patient and medical records.  Abnormal Lab Time since result:  3 hours Patient referred by:  Specialist Resulting agency:  Internal Result type: hematology   Hematology:    Hematocrit:  Low   Hemoglobin:  Low   Leukocytes:  Low   Platelets:  Low  73 year old male patient with past medical history significant for CAD, small cell lung cancer (diagnosed March 2020, currently receiving radiation therapy and chemotherapy), hypothyroidism, HTN, HLD, CKD with recent hemodialysis initiation who presents to the emergency department today with symptomatic anemia.  He was scheduled to have dialysis today, and on predialysis labs he was found to have a hemoglobin of 5.8.  He was sent to the ED for further evaluation.  Of note, he is 3 days status post new left upper arm AV fistula for dialysis, he still has a right upper chest PermCath.  He reports his last chemotherapy treatment was last week on Monday, Tuesday, Wednesday. Current chemotherapy is carboplatin and etoposide  Past Medical History:  Diagnosis Date  . Anemia   . Blood transfusion without reported diagnosis   . CAD (coronary artery disease)    STENT... MID CIRCUMFLEX...1997  . Cancer [facility])    Lung cancer  . Chronic kidney disease    STAGE 3  . COPD (chronic obstructive pulmonary disease) ([place])   . Degenerative joint disease (DJD) of lumbar spine   . GERD (gastroesophageal reflux disease)   . Gout   . Hyperlipidemia   . Hypertension   . Hypothyroidism   . Incisional hernia    abdomen  . Leukocytosis    CHRONIC MILD  . Myocardial infarction [facility])    1997    Patient Active Problem List   Diagnosis Date Noted  . Pancytopenia ([place])  05/22/2019  . ESRD (end stage renal disease) on dialysis ([place]) 05/22/2019  . Acute renal failure superimposed on stage 3 chronic kidney disease ([place]) 04/16/2019  . Small cell lung cancer, right upper lobe ([place]) 04/16/2019  . Acute renal failure (ARF) ([place]) 04/14/2019  . Anemia in chronic kidney disease (CKD) 04/14/2019  . Metabolic acidosis 32/35/5732  . Small cell lung cancer ([place]) 02/06/2019  . Encounter for antineoplastic chemotherapy 02/06/2019  . Goals of care, counseling/discussion 02/06/2019  . Special screening for malignant neoplasms, colon   . Chronic kidney disease   . GERD (gastroesophageal reflux disease)   . CAD (coronary artery disease)   . Hyperlipidemia   . Hypertension   . Gout   . Hypothyroidism   . Degenerative joint disease (DJD) of lumbar spine   . Leukocytosis   . HYPOTHYROIDISM 02/02/2010  . HLD (hyperlipidemia) 02/02/2010  . Gout, unspecified 02/02/2010  . Essential hypertension 02/02/2010  . Coronary atherosclerosis 02/02/2010  . GERD 02/02/2010  . ABDOMINAL AORTIC ANEURYSM REPAIR, HX OF 02/02/2010    Past Surgical History:  Procedure Laterality Date  . ABDOMINAL AORTIC ANEURYSM REPAIR  2006  . AV FISTULA PLACEMENT Left 04/25/2019   Procedure: ARTERIOVENOUS (AV) FISTULA CREATION LEFT ARM;  Surgeon: Angelia Mould, MD;  Location: [facility] OR;  Service: Vascular;  Laterality: Left;  . AV FISTULA PLACEMENT Left 05/19/2019   Procedure: CONVERSION OF LEFT  ARM ARTERIOVENOUS FISTULA TO GRAFT;  Surgeon: Angelia Mould, MD;  Location: [facility];  Service: Vascular;  Laterality: Left;  . BIOPSY  09/30/2018   Procedure: BIOPSY;  Surgeon: Danie Binder, MD;  [facility];  Service: Endoscopy;;  ascending colon  . COLONOSCOPY  2008  . COLONOSCOPY N/A 09/30/2018   Procedure: COLONOSCOPY;  Surgeon: Danie Binder, MD;  [facility];  Service: Endoscopy;  Laterality: N/A;  9:00  . CORONARY ANGIOPLASTY WITH STENT [place]  . IR FLUORO GUIDE CV LINE RIGHT  04/22/2019  . IR US GUIDE VASC ACCESS RIGHT  04/22/2019  . POLYPECTOMY  09/30/2018   Procedure: POLYPECTOMY;  Surgeon: Danie Binder, MD;  [facility];  Service: Endoscopy;;  colon  . VIDEO BRONCHOSCOPY WITH ENDOBRONCHIAL NAVIGATION N/A 01/27/2019   Procedure: VIDEO BRONCHOSCOPY WITH ENDOBRONCHIAL NAVIGATION;  Surgeon: Grace Isaac, MD;  Location: [facility];  Service: Thoracic;  Laterality: N/A;  . VIDEO BRONCHOSCOPY WITH ENDOBRONCHIAL ULTRASOUND N/A 01/27/2019   Procedure: VIDEO BRONCHOSCOPY WITH ENDOBRONCHIAL ULTRASOUND;  Surgeon: Grace Isaac, MD;  Location: [facility];  Service: Thoracic;  Laterality: N/A;        Home Medications    Prior to Admission medications   Medication Sig Start Date End Date Taking? Authorizing Provider  allopurinol (ZYLOPRIM) 100 MG tablet Take 1 tablet (100 mg total) by mouth daily. 04/26/19  Yes Mercy Riding, MD  amLODipine (NORVASC) 5 MG tablet Take 5 mg by mouth daily.  03/12/18  Yes [provider]  aspirin EC 81 MG tablet Take 81 mg by mouth daily.   Yes [provider]  Darbepoetin Alfa (ARANESP) 100 MCG/0.5ML SOSY injection Inject 0.5 mLs (100 mcg total) into the vein every Thursday with hemodialysis. 05/01/19  Yes Mercy Riding, MD  levothyroxine (SYNTHROID, LEVOTHROID) 175 MCG tablet Take 175 mcg by mouth daily before breakfast.   Yes [provider]  metoprolol tartrate (LOPRESSOR) 50 MG tablet Take 25 mg by mouth 2 (two) times daily.    Yes [provider]  omeprazole (PRILOSEC) 20 MG capsule Take 20 mg by mouth daily.   Yes [provider]  sevelamer carbonate (RENVELA) 800 MG tablet Take 2 tablets (1,600 mg total) by mouth 3 (three) times daily with meals. 04/26/19  Yes Mercy Riding, MD  simvastatin (ZOCOR) 20 MG tablet Take 20 mg by mouth at bedtime.  03/12/18  Yes [provider]  sucralfate (CARAFATE) 1 g tablet Take 1 tablet (1 g total)  by mouth 4 (four) times daily -  with meals and at bedtime. 5 min before meals for radiation induced esophagitis Patient taking differently: Take 1 g by mouth 3 (three) times daily as needed (stomach irritation). 5 min before meals for radiation induced esophagitis 04/11/19  Yes Tyler Pita, MD    Family History Family History  Problem Relation Age of Onset  . Stroke Brother   . Lung cancer Sister 47       lung cancer/former    Social History Social History   Tobacco Use  . Smoking status: Former Smoker    Packs/day: 0.50    Years: 54.00    Pack years: 27.00    Quit date: 09/17/2017    Years since quitting: 1.6  . Smokeless tobacco: Never Used  Substance Use Topics  . Alcohol use: Yes    Comment: occasional  . Drug use: No  Allergies   Penicillins   Review of Systems Review of Systems  Constitutional: Positive for fatigue. Negative for chills and fever.  HENT: Negative for ear pain and sore throat.   Eyes: Negative for pain and visual disturbance.  Respiratory: Negative for cough and shortness of breath.   Cardiovascular: Negative for chest pain and palpitations.  Gastrointestinal: Negative for abdominal pain and vomiting.  Genitourinary: Negative for dysuria and hematuria.  Musculoskeletal: Negative for arthralgias and back pain.  Skin: Negative for color change and rash.  Neurological: Positive for dizziness (with exertion) and weakness. Negative for seizures, syncope, light-headedness and headaches.  All other systems reviewed and are negative.    Physical Exam Updated Vital Signs BP 136/76 (BP Location: Right Arm)   Pulse 85   Temp 98.5 F (36.9 C) (Oral)   Resp 16   Ht 5\' 9"  (1.753 m)   Wt 87 kg   SpO2 99%   BMI 28.32 kg/m   Physical Exam Vitals signs and nursing note reviewed.  Constitutional:      General: He is not in acute distress.    Appearance: He is well-developed. He is ill-appearing.  HENT:     Head: Normocephalic and  atraumatic.     Right Ear: External ear normal.     Left Ear: External ear normal.     Nose: No congestion.     Mouth/Throat:     Mouth: Mucous membranes are moist.  Eyes:     Extraocular Movements: Extraocular movements intact.     Conjunctiva/sclera: Conjunctivae normal.     Pupils: Pupils are equal, round, and reactive to light.  Neck:     Musculoskeletal: Neck supple.  Cardiovascular:     Rate and Rhythm: Normal rate and regular rhythm.     Pulses:          Radial pulses are 2+ on the right side and 1+ on the left side.     Heart sounds: No murmur.     Comments: Left radial pulse slightly decreased compared to the right.  See additional physical exam, patient has moderate edema of the left upper extremity. Pulmonary:     Effort: Pulmonary effort is normal. No respiratory distress.     Breath sounds: Normal breath sounds.  Abdominal:     Palpations: Abdomen is soft.     Tenderness: There is no abdominal tenderness.     Hernia: A hernia is present. Hernia is present in the ventral area. There is no hernia in the umbilical area.     Comments: Approximately 4 x 4 centimeter ventral hernia is noted on abdominal exam, it is easily reducible.  Musculoskeletal:     Left forearm: He exhibits swelling and edema.       Arms:  Skin:    General: Skin is warm and dry.     Comments: examination of the left upper extremity reveals post surgical site to the left upper arm where he just had a new left upper arm AV graft placed on 7/6.  There is moderate ecchymosis surrounding the surgical incision site.  There is no active drainage.  There is moderate edema of the entirety of the distal left upper extremity.  Neurovascular exam of the distal aspect of the left upper extremity reveals normal sensation in radial, median and ulnar nerve distributions.  Radial pulse is slightly diminished at 1+ in comparison to the right, though no sensory change and full motor function.  Neurological:     Mental  Status:  He is alert.      ED Treatments / Results  Labs (all labs ordered are listed, but only abnormal results are displayed) Labs Reviewed  CBC WITH DIFFERENTIAL/PLATELET - Abnormal; Notable for the following components:      Result Value   WBC 1.1 (*)    RBC 1.95 (*)    Hemoglobin 6.0 (*)    HCT 18.0 (*)    RDW 15.6 (*)    Platelets 12 (*)    Neutro Abs 0.1 (*)    Abs Immature Granulocytes 0.11 (*)    All other components within normal limits  COMPREHENSIVE METABOLIC PANEL - Abnormal; Notable for the following components:   Sodium 134 (*)    Chloride 96 (*)    Glucose, Bld 119 (*)    BUN 33 (*)    Creatinine, Ser 7.04 (*)    Calcium 8.3 (*)    Total Protein 6.0 (*)    Albumin 2.7 (*)    AST 13 (*)    GFR calc non Af Amer 7 (*)    GFR calc Af Amer 8 (*)    All other components within normal limits  URINALYSIS, ROUTINE W REFLEX MICROSCOPIC - Abnormal; Notable for the following components:   Glucose, UA 150 (*)    Hgb urine dipstick MODERATE (*)    Protein, ur >=300 (*)    RBC / HPF >50 (*)    Bacteria, UA RARE (*)    All other components within normal limits  CBC - Abnormal; Notable for the following components:   WBC 1.2 (*)    RBC 1.97 (*)    Hemoglobin 6.1 (*)    HCT 17.3 (*)    Platelets 15 (*)    All other components within normal limits  RETICULOCYTES - Abnormal; Notable for the following components:   Retic Ct Pct <0.4 (*)    RBC. 1.97 (*)    Immature Retic Fract 0.0 (*)    All other components within normal limits  FERRITIN - Abnormal; Notable for the following components:   Ferritin 1,379 (*)    All other components within normal limits  IRON AND TIBC - Abnormal; Notable for the following components:   Iron 185 (*)    TIBC 231 (*)    Saturation Ratios 80 (*)    All other components within normal limits  D-DIMER, QUANTITATIVE (NOT AT [facility]) - Abnormal; Notable for the following components:   D-Dimer, Quant 5.88 (*)    All other components within  normal limits  TSH - Abnormal; Notable for the following components:   TSH 9.439 (*)    All other components within normal limits  BASIC METABOLIC PANEL - Abnormal; Notable for the following components:   Sodium 133 (*)    Chloride 97 (*)    Glucose, Bld 123 (*)    BUN 44 (*)    Creatinine, Ser 8.83 (*)    Calcium 7.9 (*)    GFR calc non Af Amer 5 (*)    GFR calc Af Amer 6 (*)    All other components within normal limits  CBC WITH DIFFERENTIAL/PLATELET - Abnormal; Notable for the following components:   WBC 1.2 (*)    RBC 1.97 (*)    Hemoglobin 6.1 (*)    HCT 17.3 (*)    Platelets 13 (*)    Neutro Abs 0.3 (*)    All other components within normal limits  HEMOGLOBIN AND HEMATOCRIT, BLOOD - Abnormal; Notable for the following components:  Hemoglobin 7.7 (*)    HCT 21.6 (*)    All other components within normal limits  CULTURE, BLOOD (ROUTINE X 2)  CULTURE, BLOOD (ROUTINE X 2)  SARS CORONAVIRUS 2 (HOSPITAL ORDER, [place] LAB)  LACTIC ACID, PLASMA  APTT  PROTIME-INR  LACTIC ACID, PLASMA  MAGNESIUM  SAVE SMEAR (SS[facility])  APTT  LACTATE DEHYDROGENASE  URIC ACID  VITAMIN B12  FIBRINOGEN  PATHOLOGIST SMEAR REVIEW  CBC WITH DIFFERENTIAL/PLATELET  MISCELLANEOUS TEST - ANATOMIC PATHOLOGY  FOLATE RBC  CBC WITH DIFFERENTIAL/PLATELET  BASIC METABOLIC PANEL  TYPE AND SCREEN  PREPARE RBC (CROSSMATCH)  PREPARE PLATELET PHERESIS  PREPARE RBC (CROSSMATCH)    EKG EKG Interpretation  Date/Time:  Thursday May 22 2019 16:14:52 EDT Ventricular Rate:  81 PR Interval:    QRS Duration: 110 QT Interval:  401 QTC Calculation: 466 R Axis:   66 Text Interpretation:  Sinus rhythm Minimal ST depression, inferior leads Borderline ECG Confirmed by Carmin Muskrat [phone]) on 05/23/2019 4:30:38 PM   Radiology Dg Chest Portable 1 View  Result Date: 05/22/2019 CLINICAL DATA:  Shortness of breath. EXAM: PORTABLE CHEST 1 VIEW COMPARISON:  04/14/2019. FINDINGS: Normal  sized heart. Tortuous aorta. Clear lungs with stable biapical pleural and parenchymal scarring. The lungs are mildly hyperexpanded. Interval right jugular catheter with its tip at the superior cavoatrial junction. No pneumothorax. Thoracic spine degenerative changes. IMPRESSION: No acute abnormality. Mild changes of COPD. Electronically Signed   By: Claudie Revering M.D.   On: 05/22/2019 15:49    Procedures Procedures (including critical care time)  Medications Ordered in ED Medications  0.9 %  sodium chloride infusion ( Intravenous Canceled Entry 05/22/19 1656)  0.9 %  sodium chloride infusion ( Intravenous Canceled Entry 05/22/19 1657)  acetaminophen (TYLENOL) tablet 650 mg (has no administration in time range)    Or  acetaminophen (TYLENOL) suppository 650 mg (has no administration in time range)  senna-docusate (Senokot-S) tablet 1 tablet (has no administration in time range)  ondansetron (ZOFRAN) tablet 4 mg (has no administration in time range)    Or  ondansetron (ZOFRAN) injection 4 mg (has no administration in time range)  allopurinol (ZYLOPRIM) tablet 100 mg (100 mg Oral Given 05/23/19 1019)  simvastatin (ZOCOR) tablet 20 mg (20 mg Oral Given 05/23/19 2135)  levothyroxine (SYNTHROID) tablet 175 mcg (175 mcg Oral Given 05/23/19 1019)  pantoprazole (PROTONIX) EC tablet 40 mg (40 mg Oral Given 05/23/19 1019)  sevelamer carbonate (RENVELA) tablet 1,600 mg (1,600 mg Oral Given 05/23/19 1808)  sucralfate (CARAFATE) tablet 1 g (1 g Oral Given 05/23/19 1808)  Chlorhexidine Gluconate Cloth 2 % PADS 6 each (has no administration in time range)  0.9 %  sodium chloride infusion (Manually program via Guardrails IV Fluids) ( Intravenous Not Given 05/23/19 1221)  Chlorhexidine Gluconate Cloth 2 % PADS 6 each (has no administration in time range)  heparin 1000 UNIT/ML injection (has no administration in time range)  0.9 %  sodium chloride infusion (Manually program via Guardrails IV Fluids) ( Intravenous New  Bag/Given 05/22/19 1657)  diphenhydrAMINE (BENADRYL) capsule 25 mg (25 mg Oral Given 05/23/19 1220)  acetaminophen (TYLENOL) tablet 650 mg (650 mg Oral Given 05/23/19 1220)  heparin injection 3,800 Units (3,800 Units Intracatheter Given 05/23/19 1736)     Initial Impression / Assessment and Plan / ED Course  I have reviewed the triage vital signs and the nursing notes.  Pertinent labs & imaging results that were available during my care of the patient were  reviewed by me and considered in my medical decision making (see chart for details).  Differentials considered: Symptomatic anemia, pneumonia, sepsis, sepsis, oncology fever   EM Physician interpretation of Labs & Imaging: . Leukopenia with WBC 1.1, Hgb 6.0 and 18 on repeat, thrombocytopenia with platelets of 12.   Medical Decision Making:  AERIK POLAN is a 73 y.o. male with medical history as above significant for small cell lung cancer and ESRD requiring hemodialysis who presented to the emergency department today for evaluation of symptomatic anemia.  Blood work has been reviewed and is significant for hemoglobin of 5.8 and hematocrit of 17 as well as leukopenia and neutropenia.   I consulted the on-call hematology/oncology attending who will follow him while he is inpatient with patient.  He requested patient be admitted to medicine/hospitalist service.  IV transfusion of RBCs and platelets has been ordered.  Patient has been updated to the plan for admission.  The care of this patient was supervised by Mauri Pole , who agreed with the plan and management of the patient.   CLINICAL IMPRESSION: 1. Symptomatic anemia   2. Thrombocytopenia ([place])   3. Neutropenia, unspecified type ([place])      Disposition: Admit  Tyris Eliot A. Jimmye Norman, MD Resident Physician, PGY-3 Emergency Medicine [facility]    Jefm Petty, MD 05/24/19 0518    Maudie Flakes, MD 05/26/19 [phone]

## 2019-05-22 NOTE — ED Notes (Signed)
WBC 1.1, platelet count of 12 called to this RN by lab. Provider notified.

## 2019-05-22 NOTE — Telephone Encounter (Signed)
Scheduled appt per 7/09 sch message- pt is aware of appt date and time.

## 2019-05-22 NOTE — Telephone Encounter (Signed)
Received call from lab with HG of 5.8 Pt is in [facility] for blood transfusion today. Spoke with Learta Codding, RN who will advise Dr. Julien Nordmann.

## 2019-05-22 NOTE — Progress Notes (Signed)
See RN progress note from [facility] infusion visit today (05/22/2019).

## 2019-05-22 NOTE — Progress Notes (Addendum)
Received critical Hgb value of 5.8 today.  Reviewed with Md Julien Nordmann who recommended pt be taken to the ER for evaluation d/t recent severe decrease in Hgb along with recently reported hematuria, current DOE, and recent surgical procedure.  Pt denies blood in stools or emesis or sputum.  Denies recent injury.  PA Lucianne Lei called EMS for transfer to [facility] ED as pt is on dialysis.  Pt made aware and alerted his wife.  Blood bank called and alerted to pt being transferred, transfusion orders held at this time for [place].  MD [facility] aware.  Report given bedside to EMS by PA Lucianne Lei and RN Learta Codding, pt transported via stretcher with belongings.  PA Lucianne Lei also called report to [facility] Agricultural consultant.

## 2019-05-22 NOTE — Progress Notes (Signed)
Symptoms Management Clinic Progress Note   Jeremy Johnson [id number] 10-23-1946 73 y.o.  Jeremy Johnson is managed by Dr. Fanny Bien. Jeremy Johnson  Actively treated with chemotherapy/immunotherapy/hormonal therapy: yes  Current therapy: Carboplatin and etoposide with Neulasta support  Last treated: 05/14/2019 (cycle 4, day 3)  Next scheduled appointment with provider: 06/02/2019  Assessment: Plan:    Small cell lung cancer, right upper lobe (HCC)  Acute renal failure superimposed on stage 3 chronic kidney disease, unspecified acute renal failure type ([place])  Anemia in stage 3 chronic kidney disease ([place])   Small cell lung cancer: The patient is status post cycle 4, day 3 of carboplatin and etoposide with Neulasta support.  His last cycle was completed on 05/14/2019.  He is scheduled to be seen in follow-up on 06/02/2019.  Anemia in setting of acute renal failure: The patient is on dialysis.  He presented to the symptom management clinic today for a transfusion of red blood cells.  His hemoglobin from 2 days ago was 6.3.  His hemoglobin today was 5.8.  He reports that he is having some blood in what urine that he does make.  He was transported via nonemergency transport to [facility] for evaluation and management.  Please see After Visit Summary for patient specific instructions.  Future Appointments  Date Time Provider [place]  05/26/2019 11:45 AM [facility] LAB 4 [facility] None  05/30/2019 12:30 PM [facility] 2 [facility] [place]  06/02/2019 11:45 AM Curt Bears, MD [facility] None  06/09/2019 11:00 AM [facility] VASC 2 - [facility]  06/09/2019 11:45 AM Nickel, Sharmon Leyden, NP [facility]  06/18/2019  2:00 PM Bruning, Ashlyn, PA-C [facility] None    No orders of the defined types were placed in this encounter.      Subjective:   Patient ID:  Jeremy Johnson is a 73 y.o. (DOB [birth year]) male.  Chief Complaint: No chief complaint on  file.   HPI Jeremy Johnson   is a 73 year old male with a diagnosis of a small cell lung cancer who is managed by Dr. Julien Nordmann and is status post cycle 4 of carboplatin and etoposide.  He has a history of anemia in a setting of acute renal failure.  2 days ago his hemoglobin was noted to be at 6.3.  He presented to the clinic today for transfusion of packed red blood cells and was found to have a hemoglobin of 5.8.  He reports that he does still produce a small amount of urine despite the fact that he is on dialysis.  He has noted bright red blood in his urine recently.  He had a shunt placed recently in his left upper extremity.  He has extensive swelling and bruising at the site.  He denies fevers, chills, sweats, nausea, vomiting, constipation, or diarrhea.  Medications: I have reviewed the patient's current medications.  Allergies:  Allergies  Allergen Reactions   Penicillins Rash and Other (See Comments)    Has patient had a PCN reaction causing immediate rash, facial/tongue/throat swelling, SOB or lightheadedness with hypotension: No Has patient had a PCN reaction causing severe rash involving mucus membranes or skin necrosis: No Has patient had a PCN reaction that required hospitalization: No Has patient had a PCN reaction occurring within the last 10 years: No If all of the above answers are "NO", then may proceed with Cephalosporin use.     Past Medical History:  Diagnosis Date   Anemia    Blood  transfusion without reported diagnosis    CAD (coronary artery disease)    STENT... MID CIRCUMFLEX...1997   Cancer ([place])    Lung cancer   Chronic kidney disease    STAGE 3   COPD (chronic obstructive pulmonary disease) (HCC)    Degenerative joint disease (DJD) of lumbar spine    GERD (gastroesophageal reflux disease)    Gout    Hyperlipidemia    Hypertension    Hypothyroidism    Incisional hernia    abdomen   Leukocytosis    CHRONIC MILD   Myocardial  infarction ([place])    1997    Past Surgical History:  Procedure Laterality Date   ABDOMINAL AORTIC ANEURYSM REPAIR  2006   AV FISTULA PLACEMENT Left 04/25/2019   Procedure: ARTERIOVENOUS (AV) FISTULA CREATION LEFT ARM;  Surgeon: Angelia Mould, MD;  Location: [place];  Service: Vascular;  Laterality: Left;   AV FISTULA PLACEMENT Left 05/19/2019   Procedure: CONVERSION OF LEFT ARM ARTERIOVENOUS FISTULA TO GRAFT;  Surgeon: Angelia Mould, MD;  Location: [place];  Service: Vascular;  Laterality: Left;   BIOPSY  09/30/2018   Procedure: BIOPSY;  Surgeon: Danie Binder, MD;  [facility];  Service: Endoscopy;;  ascending colon   COLONOSCOPY  2008   COLONOSCOPY N/A 09/30/2018   Procedure: COLONOSCOPY;  Surgeon: Danie Binder, MD;  [facility];  Service: Endoscopy;  Laterality: N/A;  9:00   CORONARY ANGIOPLASTY WITH STENT PLACEMENT  1997   MID CIRCUMFLEX   IR FLUORO GUIDE CV LINE RIGHT  04/22/2019   IR US GUIDE [place] RIGHT  04/22/2019   POLYPECTOMY  09/30/2018   Procedure: POLYPECTOMY;  Surgeon: Danie Binder, MD;  [facility];  Service: Endoscopy;;  colon   VIDEO BRONCHOSCOPY WITH ENDOBRONCHIAL NAVIGATION N/A 01/27/2019   Procedure: VIDEO BRONCHOSCOPY WITH ENDOBRONCHIAL NAVIGATION;  Surgeon: Grace Isaac, MD;  Location: [place];  Service: Thoracic;  Laterality: N/A;   VIDEO BRONCHOSCOPY WITH ENDOBRONCHIAL ULTRASOUND N/A 01/27/2019   Procedure: VIDEO BRONCHOSCOPY WITH ENDOBRONCHIAL ULTRASOUND;  Surgeon: Grace Isaac, MD;  [facility];  Service: Thoracic;  Laterality: N/A;    Family History  Problem Relation Age of Onset   Stroke Brother    Lung cancer Sister 54       lung cancer/former    Social History   Socioeconomic History   Marital status: Married    Spouse name: Not on file   Number of children: 2   Years of education: Not on file   Highest education level: Not on file  Occupational History     Comment: retired  Scientist, product/process development strain: Not on file   Food insecurity    Worry: Not on file    Inability: Not on Lexicographer needs    Medical: Not on file    Non-medical: Not on file  Tobacco Use   Smoking status: Former Smoker    Packs/day: 0.50    Years: 54.00    Pack years: 27.00    Quit date: 09/17/2017    Years since quitting: 1.6   Smokeless tobacco: Never Used  Substance and Sexual Activity   Alcohol use: Yes    Comment: occasional   Drug use: No   Sexual activity: Not Currently  Lifestyle   Physical activity    Days per week: Not on file    Minutes per session: Not on file   Stress: Not on  file  Relationships   Social connections    Talks on phone: Not on file    Gets together: Not on file    Attends religious service: Not on file    Active member of club or organization: Not on file    Attends meetings of clubs or organizations: Not on file    Relationship status: Not on file   Intimate partner violence    Fear of current or ex partner: Not on file    Emotionally abused: Not on file    Physically abused: Not on file    Forced sexual activity: Not on file  Other Topics Concern   Not on file  Social History Narrative   Not on file    Past Medical History, Surgical history, Social history, and Family history were reviewed and updated as appropriate.   Please see review of systems for further details on the patient's review from today.   Review of Systems:  Review of Systems  Constitutional: Negative for activity change, appetite change, chills, diaphoresis and fever.  Respiratory: Negative for cough, chest tightness and shortness of breath.   Cardiovascular: Negative for chest pain, palpitations and leg swelling.  Gastrointestinal: Negative for abdominal distention, abdominal pain, constipation, diarrhea, nausea and vomiting.  Skin: Positive for color change.       Swelling and bruising of the left upper  extremity.    Objective:   Physical Exam:  There were no vitals taken for this visit. ECOG: 1  Physical Exam Constitutional:      General: He is not in acute distress.    Appearance: He is not diaphoretic.  HENT:     Head: Normocephalic and atraumatic.  Eyes:     General: No scleral icterus.       Right eye: No discharge.        Left eye: No discharge.     Conjunctiva/sclera: Conjunctivae normal.  Cardiovascular:     Rate and Rhythm: Normal rate and regular rhythm.     Heart sounds: Normal heart sounds. No murmur. No friction rub. No gallop.      Comments: Permacath noted in the right chest wall Pulmonary:     Effort: Pulmonary effort is normal. No respiratory distress.     Breath sounds: Normal breath sounds. No wheezing or rales.  Skin:    General: Skin is warm and dry.     Findings: Bruising present. No erythema or rash.     Comments: Extensive bruising and swelling was noted and the left upper extremity in the area associated with a recent dialysis shunt placement.  Neurological:     Mental Status: He is alert.  Psychiatric:        Mood and Affect: Mood normal.        Behavior: Behavior normal.        Thought Content: Thought content normal.        Judgment: Judgment normal.     Lab Review:     Component Value Date/Time   NA 134 (L) 05/22/2019 1418   K 3.8 05/22/2019 1418   CL 96 (L) 05/22/2019 1418   CO2 25 05/22/2019 1418   GLUCOSE 119 (H) 05/22/2019 1418   BUN 33 (H) 05/22/2019 1418   CREATININE 7.04 (H) 05/22/2019 1418   CREATININE 7.07 (HH) 05/12/2019 0953   CALCIUM 8.3 (L) 05/22/2019 1418   PROT 6.0 (L) 05/22/2019 1418   ALBUMIN 2.7 (L) 05/22/2019 1418   AST 13 (L) 05/22/2019 1418   AST  16 05/12/2019 0953   ALT 11 05/22/2019 1418   ALT 10 05/12/2019 0953   ALKPHOS 92 05/22/2019 1418   BILITOT 0.8 05/22/2019 1418   BILITOT 0.3 05/12/2019 0953   GFRNONAA 7 (L) 05/22/2019 1418   GFRNONAA 7 (L) 05/12/2019 0953   GFRAA 8 (L) 05/22/2019 1418    GFRAA 8 (L) 05/12/2019 0953       Component Value Date/Time   WBC 1.1 (LL) 05/22/2019 1418   RBC 1.95 (L) 05/22/2019 1418   HGB 6.0 (LL) 05/22/2019 1418   HGB 5.8 (LL) 05/22/2019 1232   HCT 18.0 (L) 05/22/2019 1418   PLT 12 (LL) 05/22/2019 1418   PLT 12 (L) 05/22/2019 1232   MCV 92.3 05/22/2019 1418   MCH 30.8 05/22/2019 1418   MCHC 33.3 05/22/2019 1418   RDW 15.6 (H) 05/22/2019 1418   LYMPHSABS 0.8 05/22/2019 1418   MONOABS 0.1 05/22/2019 1418   EOSABS 0.0 05/22/2019 1418   BASOSABS 0.0 05/22/2019 1418   -------------------------------  Imaging from last 24 hours (if applicable):  Radiology interpretation: Vas US Duplex Dialysis Access (avf, Avg)  Result Date: 05/14/2019 DIALYSIS ACCESS Reason for Exam: No palpable thrill for AVF/AVG. Access Site: Left Upper Extremity. Access Type: Brachial-cephalic AVF 7/37/1062 Performing Technologist: Ronal Fear RVS, RCS  Examination Guidelines: A complete evaluation includes B-mode imaging, spectral Doppler, color Doppler, and power Doppler as needed of all accessible portions of each vessel. Unilateral testing is considered an integral part of a complete examination. Limited examinations for reoccurring indications may be performed as noted.  Findings: +--------------------+----------+-----------------+--------+  AVF                  PSV (cm/s) Flow Vol (mL/min) Comments  +--------------------+----------+-----------------+--------+  Native artery inflow     98                                 +--------------------+----------+-----------------+--------+  AVF Anastomosis          62                                 +--------------------+----------+-----------------+--------+  +------------+----------+-------------+----------+-------------------+  OUTFLOW VEIN PSV (cm/s) Diameter (cm) Depth (cm)      Describe        +------------+----------+-------------+----------+-------------------+  Shoulder                    0.21         1.18                          +------------+----------+-------------+----------+-------------------+  Prox UA                     0.23         0.68                         +------------+----------+-------------+----------+-------------------+  Mid UA                      0.37         0.52                         +------------+----------+-------------+----------+-------------------+  Dist UA  0.43         0.31    partially-occlusive  +------------+----------+-------------+----------+-------------------+  AC Fossa                    0.22                      occluded        +------------+----------+-------------+----------+-------------------+  +-----------------+-------------+----------+---------+----------+--------------+  basilic vein:     Diameter (cm) Depth (cm) Branching PSV (cm/s)  Flow Volume                                                                        (ml/min)     +-----------------+-------------+----------+---------+----------+--------------+  proximal upper        0.62                                                       arm                                                                             +-----------------+-------------+----------+---------+----------+--------------+  mid upper arm         0.55                                                      +-----------------+-------------+----------+---------+----------+--------------+  distal upper arm      0.52                                                      +-----------------+-------------+----------+---------+----------+--------------+  antecubital fossa     0.33                   0.22                               +-----------------+-------------+----------+---------+----------+--------------+  Summary: Arteriovenous fistula-Thrombus noted in the antecubital fossa region with no color or Doppler flow.  Widely patent left basilic vein.  *See table(s) above for measurements and observations.  Diagnosing physician: [name] MD  Electronically signed by [name] MD on 05/14/2019 at 4:59:25 PM.    --------------------------------------------------------------------------------   Final    Vas [place] Upper Ext Vein Mapping (pre-op Avf)  Result Date: 04/24/2019 UPPER EXTREMITY VEIN MAPPING  Indications: Pre-access. Comparison Study: No prior studies.  Examination Guidelines: A complete evaluation includes B-mode imaging, spectral Doppler, color Doppler, and power Doppler as needed of all accessible  portions of each vessel. Bilateral testing is considered an integral part of a complete examination. Limited examinations for reoccurring indications may be performed as noted. +-----------------+-------------+----------+---------+  Right Cephalic    Diameter (cm) Depth (cm) Findings   +-----------------+-------------+----------+---------+  Shoulder              0.48         0.83               +-----------------+-------------+----------+---------+  Prox upper arm        0.40         0.93               +-----------------+-------------+----------+---------+  Mid upper arm         0.38         0.39               +-----------------+-------------+----------+---------+  Dist upper arm        0.32         0.38               +-----------------+-------------+----------+---------+  Antecubital fossa     0.33         0.58    branching  +-----------------+-------------+----------+---------+  Prox forearm          0.27         0.52               +-----------------+-------------+----------+---------+  Mid forearm           0.22         0.48               +-----------------+-------------+----------+---------+  Dist forearm          0.25         0.31               +-----------------+-------------+----------+---------+ +-----------------+-------------+----------+---------+  Left Cephalic     Diameter (cm) Depth (cm) Findings   +-----------------+-------------+----------+---------+  Shoulder              0.42         1.08                +-----------------+-------------+----------+---------+  Prox upper arm        0.35         1.14               +-----------------+-------------+----------+---------+  Mid upper arm         0.32         0.40    branching  +-----------------+-------------+----------+---------+  Dist upper arm        0.44         0.38               +-----------------+-------------+----------+---------+  Antecubital fossa     0.43         0.40    branching  +-----------------+-------------+----------+---------+  Prox forearm          0.38         0.48    branching  +-----------------+-------------+----------+---------+  Mid forearm           0.29         0.45               +-----------------+-------------+----------+---------+  Dist forearm          0.22         0.27               +-----------------+-------------+----------+---------+ *  See table(s) above for measurements and observations.  Diagnosing physician: Servando Snare MD Electronically signed by Servando Snare MD on 04/24/2019 at 5:32:07 PM.    Final         This case was discussed with Dr. Julien Nordmann. He expressed agreement with my management of this patient.

## 2019-05-22 NOTE — Consult Note (Signed)
ED Consult    Reason for Consult:  Left arm swelling Referring Physician:  Dr. [place] MRN #:  [id number]  History of Present Illness: This is a 73 y.o. male with end-stage renal disease currently on dialysis via catheter.  He has previously failed left arm fistula.  Recently had left arm graft placement.  Now presents with pancytopenia.  Has swelling and ecchymosis of his left upper extremity.  Does not have much pain to left arm.  Sensation and motor of his left hand is intact  Past Medical History:  Diagnosis Date  . Anemia   . Blood transfusion without reported diagnosis   . CAD (coronary artery disease)    STENT... MID CIRCUMFLEX...1997  . Cancer [facility])    Lung cancer  . Chronic kidney disease    STAGE 3  . COPD (chronic obstructive pulmonary disease) ([place])   . Degenerative joint disease (DJD) of lumbar spine   . GERD (gastroesophageal reflux disease)   . Gout   . Hyperlipidemia   . Hypertension   . Hypothyroidism   . Incisional hernia    abdomen  . Leukocytosis    CHRONIC MILD  . Myocardial infarction ([place])    1997    Past Surgical History:  Procedure Laterality Date  . ABDOMINAL AORTIC ANEURYSM REPAIR  2006  . AV FISTULA PLACEMENT Left 04/25/2019   Procedure: ARTERIOVENOUS (AV) FISTULA CREATION LEFT ARM;  Surgeon: Angelia Mould, MD;  Location: [place];  Service: Vascular;  Laterality: Left;  . AV FISTULA PLACEMENT Left 05/19/2019   Procedure: CONVERSION OF LEFT ARM ARTERIOVENOUS FISTULA TO GRAFT;  Surgeon: Angelia Mould, MD;  Location: [place];  Service: Vascular;  Laterality: Left;  . BIOPSY  09/30/2018   Procedure: BIOPSY;  Surgeon: Danie Binder, MD;  [facility];  Service: Endoscopy;;  ascending colon  . COLONOSCOPY  2008  . COLONOSCOPY N/A 09/30/2018   Procedure: COLONOSCOPY;  Surgeon: Danie Binder, MD;  [facility];  Service: Endoscopy;  Laterality: N/A;  9:00  . CORONARY ANGIOPLASTY WITH STENT [place]  . IR FLUORO GUIDE CV LINE RIGHT  04/22/2019  . IR US GUIDE VASC ACCESS RIGHT  04/22/2019  . POLYPECTOMY  09/30/2018   Procedure: POLYPECTOMY;  Surgeon: Danie Binder, MD;  [facility];  Service: Endoscopy;;  colon  . VIDEO BRONCHOSCOPY WITH ENDOBRONCHIAL NAVIGATION N/A 01/27/2019   Procedure: VIDEO BRONCHOSCOPY WITH ENDOBRONCHIAL NAVIGATION;  Surgeon: Grace Isaac, MD;  Location: [place];  Service: Thoracic;  Laterality: N/A;  . VIDEO BRONCHOSCOPY WITH ENDOBRONCHIAL ULTRASOUND N/A 01/27/2019   Procedure: VIDEO BRONCHOSCOPY WITH ENDOBRONCHIAL ULTRASOUND;  Surgeon: Grace Isaac, MD;  Location: [place];  Service: Thoracic;  Laterality: N/A;    Allergies  Allergen Reactions  . Penicillins Rash and Other (See Comments)    Has patient had a PCN reaction causing immediate rash, facial/tongue/throat swelling, SOB or lightheadedness with hypotension: No Has patient had a PCN reaction causing severe rash involving mucus membranes or skin necrosis: No Has patient had a PCN reaction that required hospitalization: No Has patient had a PCN reaction occurring within the last 10 years: No If all of the above answers are "NO", then may proceed with Cephalosporin use.     Prior to Admission medications   Medication Sig Start Date End Date Taking? Authorizing Provider  allopurinol (ZYLOPRIM) 100 MG tablet Take 1 tablet (100 mg total) by mouth daily. 04/26/19  Yes  Mercy Riding, MD  amLODipine (NORVASC) 5 MG tablet Take 5 mg by mouth daily.  03/12/18  Yes [provider]  aspirin EC 81 MG tablet Take 81 mg by mouth daily.   Yes [provider]  Darbepoetin Alfa (ARANESP) 100 MCG/0.5ML SOSY injection Inject 0.5 mLs (100 mcg total) into the vein every Thursday with hemodialysis. 05/01/19  Yes Mercy Riding, MD  levothyroxine (SYNTHROID, LEVOTHROID) 175 MCG tablet Take 175 mcg by mouth daily before breakfast.   Yes [provider]  metoprolol tartrate  (LOPRESSOR) 50 MG tablet Take 25 mg by mouth 2 (two) times daily.    Yes [provider]  omeprazole (PRILOSEC) 20 MG capsule Take 20 mg by mouth daily.   Yes [provider]  sevelamer carbonate (RENVELA) 800 MG tablet Take 2 tablets (1,600 mg total) by mouth 3 (three) times daily with meals. 04/26/19  Yes Mercy Riding, MD  simvastatin (ZOCOR) 20 MG tablet Take 20 mg by mouth at bedtime.  03/12/18  Yes [provider]  sucralfate (CARAFATE) 1 g tablet Take 1 tablet (1 g total) by mouth 4 (four) times daily -  with meals and at bedtime. 5 min before meals for radiation induced esophagitis Patient taking differently: Take 1 g by mouth 3 (three) times daily as needed (stomach irritation). 5 min before meals for radiation induced esophagitis 04/11/19  Yes Tyler Pita, MD    Social History   Socioeconomic History  . Marital status: Married    Spouse name: Not on file  . Number of children: 2  . Years of education: Not on file  . Highest education level: Not on file  Occupational History    Comment: retired  Scientific laboratory technician  . Financial resource strain: Not on file  . Food insecurity    Worry: Not on file    Inability: Not on file  . Transportation needs    Medical: Not on file    Non-medical: Not on file  Tobacco Use  . Smoking status: Former Smoker    Packs/day: 0.50    Years: 54.00    Pack years: 27.00    Quit date: 09/17/2017    Years since quitting: 1.6  . Smokeless tobacco: Never Used  Substance and Sexual Activity  . Alcohol use: Yes    Comment: occasional  . Drug use: No  . Sexual activity: Not Currently  Lifestyle  . Physical activity    Days per week: Not on file    Minutes per session: Not on file  . Stress: Not on file  Relationships  . Social Herbalist on phone: Not on file    Gets together: Not on file    Attends religious service: Not on file    Active member of club or organization: Not on file    Attends meetings of  clubs or organizations: Not on file    Relationship status: Not on file  . Intimate partner violence    Fear of current or ex partner: Not on file    Emotionally abused: Not on file    Physically abused: Not on file    Forced sexual activity: Not on file  Other Topics Concern  . Not on file  Social History Narrative  . Not on file    Family History  Problem Relation Age of Onset  . Stroke Brother   . Lung cancer Sister 20       lung cancer/former  ROS: swelling and bruising of left arm   Physical Examination  Vitals:   05/22/19 1650 05/22/19 1702  BP: 122/70 (!) 141/80  Pulse: 85 84  Resp: 17 (!) 23  Temp: 98.3 F (36.8 C) 98.5 F (36.9 C)  SpO2: 100% 99%   Body mass index is 28.65 kg/m.  General: No acute distress HENT: WNL, normocephalic Pulmonary: normal non-labored breathing Cardiac: Palpable left radial pulse Extremities: Bruising and mild edema of left upper extremity.  Skin is intact incisions are clean dry intact Neurologic: A&O X 3; sensory and motor left hand is intact  CBC    Component Value Date/Time   WBC 1.1 (LL) 05/22/2019 1418   RBC 1.95 (L) 05/22/2019 1418   HGB 6.0 (LL) 05/22/2019 1418   HGB 5.8 (LL) 05/22/2019 1232   HCT 18.0 (L) 05/22/2019 1418   PLT 12 (LL) 05/22/2019 1418   PLT 12 (L) 05/22/2019 1232   MCV 92.3 05/22/2019 1418   MCH 30.8 05/22/2019 1418   MCHC 33.3 05/22/2019 1418   RDW 15.6 (H) 05/22/2019 1418   LYMPHSABS 0.8 05/22/2019 1418   MONOABS 0.1 05/22/2019 1418   EOSABS 0.0 05/22/2019 1418   BASOSABS 0.0 05/22/2019 1418    BMET    Component Value Date/Time   NA 134 (L) 05/22/2019 1418   K 3.8 05/22/2019 1418   CL 96 (L) 05/22/2019 1418   CO2 25 05/22/2019 1418   GLUCOSE 119 (H) 05/22/2019 1418   BUN 33 (H) 05/22/2019 1418   CREATININE 7.04 (H) 05/22/2019 1418   CREATININE 7.07 (HH) 05/12/2019 0953   CALCIUM 8.3 (L) 05/22/2019 1418   GFRNONAA 7 (L) 05/22/2019 1418   GFRNONAA 7 (L) 05/12/2019 0953   GFRAA  8 (L) 05/22/2019 1418   GFRAA 8 (L) 05/12/2019 0953    COAGS: Lab Results  Component Value Date   INR 1.0 05/22/2019   INR 1.1 04/25/2019   INR 1.2 04/17/2019     ASSESSMENT/PLAN: This is a 73 y.o. male here with end-stage renal disease admitted with pancytopenia.  Recent left arm AV graft with minimal hematoma and swelling of his entire left upper extremity although mild and minimally symptomatic.  May have a central stenosis given swelling and previously failed fistula but at this time would not intervene.  I will notify Dr. Scot Dock of his admission but do not anticipate any surgical intervention necessary  Kitara Hebb C. Donzetta Matters, MD [facility] Office: [phone] Pager: [phone]

## 2019-05-22 NOTE — ED Triage Notes (Signed)
PT sent from Cancer center because of HGB 5.8 . Pt reported he was short of breath with movement and a little dizzy the patient had a AV graft placed with a large bruise as a concern for blood loss.

## 2019-05-22 NOTE — ED Notes (Signed)
Date and time results received: 05/22/19 1520 (use smartphrase ".now" to insert current time)  Test: hemoglobin Critical Value: 6.0  Name of Provider Notified: Dr Sedonia Small  Orders Received? Or Actions Taken?: Orders Received - See Orders for details

## 2019-05-22 NOTE — Progress Notes (Addendum)
Received report from day shift nurse stated patient will received second unit of blood at dialysis per doctor order. Therefore day shift nurse did not transfuse second unit even after released. Blood did not get pick up and still at the blood bank.

## 2019-05-22 NOTE — ED Notes (Signed)
Portable at bedside 

## 2019-05-22 NOTE — Consult Note (Addendum)
[facility] Renal Consultation Note  Requesting MD: [place]  Indication for Consultation:  ESRD  Chief complaint: weakness   HPI: Jeremy Johnson is a 73 y.o. male with a history of new ESRD, small cell lung cancer, and CAD who presented with fatigue - "felt terrible".  He states that he received chemotherapy last week and felt like his blood counts were dropping.  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  He has been dialyzing at [facility] TTS.  He presented from the cancer center with weakness and fatigue and was found to have pancytopenia.  He has received one unit of PRBC's and they are preparing a second unit to give tomorrow with HD.  He missed dialysis on Tuesday this week and had a treatment yesterday to make up.  Missed treatment today with acute issues above.  He states that he has passed out with dialysis as recently as Saturday, 7/4.  Yesterday his blood pressure dropped with treatment.  They are adjusting his regimen - he tells me he takes amlodipine 5 mg daily and metoprolol 25 mg BID.   He recently had LUE AV graft placed earlier this week and given worsening edema in this extremity vascular has evaluated the patient; they do not anticipate need for surgical intervention.  Note failed LUE AVF.  Started HD 6/9 per catheter per charting.  Hemoglobin was 6 earlier today and had been 8.2 on 7/2 at HD and 8.8 on 6/25.  Hb 6.5 on 7/6 as well.  He had been ordered mircera 30 at outpatient unit but note that he had not received it.  SARS Coronavirus 2 negative tonight and 7 days ago.   PMHx:   Past Medical History:  Diagnosis Date  . Anemia   . Blood transfusion without reported diagnosis   . CAD (coronary artery disease)    STENT... MID  CIRCUMFLEX...1997  . Cancer [facility])    Lung cancer  . Chronic kidney disease    STAGE 3  . COPD (chronic obstructive pulmonary disease) ([place])   . Degenerative joint disease (DJD) of lumbar spine   . GERD (gastroesophageal reflux disease)   . Gout   . Hyperlipidemia   . Hypertension   . Hypothyroidism   . Incisional hernia    abdomen  . Leukocytosis    CHRONIC MILD  . Myocardial infarction ([place])    1997    Past Surgical History:  Procedure Laterality Date  . ABDOMINAL AORTIC ANEURYSM REPAIR  2006  . AV FISTULA PLACEMENT Left 04/25/2019   Procedure: ARTERIOVENOUS (AV) FISTULA CREATION LEFT ARM;  Surgeon: Angelia Mould, MD;  Location: [place];  Service: Vascular;  Laterality: Left;  . AV FISTULA PLACEMENT Left 05/19/2019   Procedure: CONVERSION OF LEFT ARM ARTERIOVENOUS FISTULA TO GRAFT;  Surgeon: Angelia Mould, MD;  Location: [place];  Service: Vascular;  Laterality: Left;  . BIOPSY  09/30/2018   Procedure: BIOPSY;  Surgeon: Danie Binder, MD;  [facility];  Service: Endoscopy;;  ascending colon  . COLONOSCOPY  2008  . COLONOSCOPY N/A 09/30/2018   Procedure: COLONOSCOPY;  Surgeon: Danie Binder, MD;  [facility];  Service: Endoscopy;  Laterality: N/A;  9:00  . [place]  MID CIRCUMFLEX  . IR FLUORO GUIDE CV LINE RIGHT  04/22/2019  . IR US GUIDE VASC ACCESS RIGHT  04/22/2019  . POLYPECTOMY  09/30/2018   Procedure: POLYPECTOMY;  Surgeon: Danie Binder, MD;  [facility];  Service: Endoscopy;;  colon  . VIDEO BRONCHOSCOPY WITH ENDOBRONCHIAL NAVIGATION N/A 01/27/2019   Procedure: VIDEO BRONCHOSCOPY WITH ENDOBRONCHIAL NAVIGATION;  Surgeon: Grace Isaac, MD;  Location: [place];  Service: Thoracic;  Laterality: N/A;  . VIDEO BRONCHOSCOPY WITH ENDOBRONCHIAL ULTRASOUND N/A 01/27/2019   Procedure: VIDEO BRONCHOSCOPY WITH ENDOBRONCHIAL ULTRASOUND;  Surgeon: Grace Isaac, MD;  Location: [facility];   Service: Thoracic;  Laterality: N/A;    Family Hx:  Family History  Problem Relation Age of Onset  . Stroke Brother   . Lung cancer Sister 50       lung cancer/former    Social History:  reports that he quit smoking about 20 months ago. He has a 27.00 pack-year smoking history. He has never used smokeless tobacco. He reports current alcohol use. He reports that he does not use drugs.  Allergies:  Allergies  Allergen Reactions  . Penicillins Rash and Other (See Comments)    Has patient had a PCN reaction causing immediate rash, facial/tongue/throat swelling, SOB or lightheadedness with hypotension: No Has patient had a PCN reaction causing severe rash involving mucus membranes or skin necrosis: No Has patient had a PCN reaction that required hospitalization: No Has patient had a PCN reaction occurring within the last 10 years: No If all of the above answers are "NO", then may proceed with Cephalosporin use.     Medications: Prior to Admission medications   Medication Sig Start Date End Date Taking? Authorizing Provider  allopurinol (ZYLOPRIM) 100 MG tablet Take 1 tablet (100 mg total) by mouth daily. 04/26/19  Yes Mercy Riding, MD  amLODipine (NORVASC) 5 MG tablet Take 5 mg by mouth daily.  03/12/18  Yes [provider]  aspirin EC 81 MG tablet Take 81 mg by mouth daily.   Yes [provider]  Darbepoetin Alfa (ARANESP) 100 MCG/0.5ML SOSY injection Inject 0.5 mLs (100 mcg total) into the vein every Thursday with hemodialysis. 05/01/19  Yes Mercy Riding, MD  levothyroxine (SYNTHROID, LEVOTHROID) 175 MCG tablet Take 175 mcg by mouth daily before breakfast.   Yes [provider]  metoprolol tartrate (LOPRESSOR) 50 MG tablet Take 25 mg by mouth 2 (two) times daily.    Yes [provider]  omeprazole (PRILOSEC) 20 MG capsule Take 20 mg by mouth daily.   Yes [provider]  sevelamer carbonate (RENVELA) 800 MG tablet Take 2 tablets (1,600 mg  total) by mouth 3 (three) times daily with meals. 04/26/19  Yes Mercy Riding, MD  simvastatin (ZOCOR) 20 MG tablet Take 20 mg by mouth at bedtime.  03/12/18  Yes [provider]  sucralfate (CARAFATE) 1 g tablet Take 1 tablet (1 g total) by mouth 4 (four) times daily -  with meals and at bedtime. 5 min before meals for radiation induced esophagitis Patient taking differently: Take 1 g by mouth 3 (three) times daily as needed (stomach irritation). 5 min before meals for radiation induced esophagitis 04/11/19  Yes Tyler Pita, MD    I have reviewed the patient's current medications.  Labs:  BMP Latest Ref Rng & Units 05/22/2019 05/19/2019 05/12/2019  Glucose 70 - 99 mg/dL 119(H) 105(H) 165(H)  BUN 8 - 23 mg/dL 33(H) - 25(H)  Creatinine 0.61 -  1.24 mg/dL 7.04(H) - 7.07(HH)  Sodium 135 - 145 mmol/L 134(L) 132(L) 137  Potassium 3.5 - 5.1 mmol/L 3.8 3.6 3.4(L)  Chloride 98 - 111 mmol/L 96(L) - 100  CO2 22 - 32 mmol/L 25 - 25  Calcium 8.9 - 10.3 mg/dL 8.3(L) - 8.0(L)    ROS:  Pertinent items noted in HPI and remainder of comprehensive ROS otherwise negative.  Physical Exam: Vitals:   05/22/19 1831 05/22/19 1915  BP: (!) 145/88 (!) 152/79  Pulse: 81 81  Resp: 16 18  Temp:  98.6 F (37 C)  SpO2: 100% 100%     General: adult male in bed in NAD at rest HEENT:NCAT Eyes: EOMI; sclera anicteric Neck: supple no JVD Heart: RRR; no rub Lungs:clear to auscultation bilaterally ; unlabored Abdomen:  Soft/NT/ND normal bowel sounds Extremities: no lower extremity edema; edema LUE  Skin: bruising LUE  Neuro: alert and oriented x 3; provides a history and follows commands Access: right chest tunneled catheter and LUE AVG with bruit and thrill; bruising and swelling of extremity  HD outpatient orders:  4 hours EDW 86.5 kg  400 BF/800 dialysate  3K 2.5 calcium bath  180 dialyzer Currently using tunneled catheter    Assessment/Plan:  # ESRD - Recent dialysis dependent AKI and was  felt to have progressed to ESRD per outpatient charting - Patient is on hemodialysis Tuesday Thursday Saturday - No emergent indication for HD tonight - Plan for dialysis on 7/10 and anticipate HD on 7/11 as well to return to TTS schedule  # Anemia - Setting of chemotherapy as well as AKI  - Plan for the second unit of blood to be given on dialysis tomorrow if possible; if has been released ok to go ahead and transfuse tonight as tolerated  - Will defer any ESA given his malignancy    # Pancytopenia - Setting of chemotherapy   # Small cell lung cancer - s/p cycle 4 day 3 of carboplatin and etoposide with neulasta support per hem/onc note.  Last cycle completed 7/1 - per hematology/oncology   # HTN - Given hypotension on HD would hold amlodipine.  Metoprolol is currently on hold as well.  Ok to resume low dose beta blocker if agent needed for BP control   # Syncope on HD - Hopeful for improvement with transfusion and holding anti-hypertensives     Claudia Desanctis 05/22/2019, 7:35 PM

## 2019-05-22 NOTE — ED Triage Notes (Signed)
PT goes to dialysis  T, W , T

## 2019-05-22 NOTE — Patient Instructions (Signed)
Anemia  Anemia is a condition in which you do not have enough red blood cells or hemoglobin. Hemoglobin is a substance in red blood cells that carries oxygen. When you do not have enough red blood cells or hemoglobin (are anemic), your body cannot get enough oxygen and your organs may not work properly. As a result, you may feel very tired or have other problems. What are the causes? Common causes of anemia include:  Excessive bleeding. Anemia can be caused by excessive bleeding inside or outside the body, including bleeding from the intestine or from periods in women.  Poor nutrition.  Long-lasting (chronic) kidney, thyroid, and liver disease.  Bone marrow disorders.  Cancer and treatments for cancer.  HIV (human immunodeficiency virus) and AIDS (acquired immunodeficiency syndrome).  Treatments for HIV and AIDS.  Spleen problems.  Blood disorders.  Infections, medicines, and autoimmune disorders that destroy red blood cells. What are the signs or symptoms? Symptoms of this condition include:  Minor weakness.  Dizziness.  Headache.  Feeling heartbeats that are irregular or faster than normal (palpitations).  Shortness of breath, especially with exercise.  Paleness.  Cold sensitivity.  Indigestion.  Nausea.  Difficulty sleeping.  Difficulty concentrating. Symptoms may occur suddenly or develop slowly. If your anemia is mild, you may not have symptoms. How is this diagnosed? This condition is diagnosed based on:  Blood tests.  Your medical history.  A physical exam.  Bone marrow biopsy. Your health care provider may also check your stool (feces) for blood and may do additional testing to look for the cause of your bleeding. You may also have other tests, including:  Imaging tests, such as a CT scan or MRI.  Endoscopy.  Colonoscopy. How is this treated? Treatment for this condition depends on the cause. If you continue to lose a lot of blood, you may  need to be treated at a hospital. Treatment may include:  Taking supplements of iron, vitamin S31, or folic acid.  Taking a hormone medicine (erythropoietin) that can help to stimulate red blood cell growth.  Having a blood transfusion. This may be needed if you lose a lot of blood.  Making changes to your diet.  Having surgery to remove your spleen. Follow these instructions at home:  Take over-the-counter and prescription medicines only as told by your health care provider.  Take supplements only as told by your health care provider.  Follow any diet instructions that you were given.  Keep all follow-up visits as told by your health care provider. This is important. Contact a health care provider if:  You develop new bleeding anywhere in the body. Get help right away if:  You are very weak.  You are short of breath.  You have pain in your abdomen or chest.  You are dizzy or feel faint.  You have trouble concentrating.  You have bloody or black, tarry stools.  You vomit repeatedly or you vomit up blood. Summary  Anemia is a condition in which you do not have enough red blood cells or enough of a substance in your red blood cells that carries oxygen (hemoglobin).  Symptoms may occur suddenly or develop slowly.  If your anemia is mild, you may not have symptoms.  This condition is diagnosed with blood tests as well as a medical history and physical exam. Other tests may be needed.  Treatment for this condition depends on the cause of the anemia. This information is not intended to replace advice given to you by  your health care provider. Make sure you discuss any questions you have with your health care provider. Document Released: 12/07/2004 Document Revised: 10/12/2017 Document Reviewed: 12/01/2016 Elsevier Patient Education  2020 [employer].

## 2019-05-22 NOTE — ED Notes (Signed)
Attempted report x1. 

## 2019-05-22 NOTE — Plan of Care (Signed)
  Problem: Health Behavior/Discharge Planning: Goal: Ability to manage health-related needs will improve Outcome: Progressing   Problem: Education: Goal: Knowledge of General Education information will improve Description: Including pain rating scale, medication(s)/side effects and non-pharmacologic comfort measures Outcome: Progressing   Problem: Clinical Measurements: Goal: Will remain free from infection Outcome: Progressing   Problem: Clinical Measurements: Goal: Ability to maintain clinical measurements within normal limits will improve Outcome: Progressing   Problem: Clinical Measurements: Goal: Diagnostic test results will improve Outcome: Progressing

## 2019-05-23 ENCOUNTER — Ambulatory Visit: Payer: Medicare Other

## 2019-05-23 ENCOUNTER — Telehealth: Payer: Self-pay | Admitting: Medical

## 2019-05-23 ENCOUNTER — Encounter (HOSPITAL_COMMUNITY): Payer: Medicare Other

## 2019-05-23 ENCOUNTER — Other Ambulatory Visit: Payer: Medicare Other

## 2019-05-23 LAB — RETICULOCYTES
Immature Retic Fract: 0 % — ABNORMAL LOW (ref 2.3–15.9)
RBC.: 1.97 MIL/uL — ABNORMAL LOW (ref 4.22–5.81)
Retic Ct Pct: <0.4 % — ABNORMAL LOW (ref 0.4–3.1)

## 2019-05-23 LAB — MAGNESIUM: Magnesium: 1.9 mg/dL (ref 1.7–2.4)

## 2019-05-23 LAB — CBC WITH DIFFERENTIAL/PLATELET
Abs Immature Granulocytes: 0.03 10*3/uL (ref 0.00–0.07)
Basophils Absolute: 0 10*3/uL (ref 0.0–0.1)
Basophils Relative: 2 %
Eosinophils Absolute: 0.1 10*3/uL (ref 0.0–0.5)
Eosinophils Relative: 4 %
HCT: 17.3 % — ABNORMAL LOW (ref 39.0–52.0)
Hemoglobin: 6.1 g/dL — CL (ref 13.0–17.0)
Immature Granulocytes: 2 %
Lymphocytes Relative: 56 %
Lymphs Abs: 0.7 10*3/uL (ref 0.7–4.0)
MCH: 31 pg (ref 26.0–34.0)
MCHC: 35.3 g/dL (ref 30.0–36.0)
MCV: 87.8 fL (ref 80.0–100.0)
Monocytes Absolute: 0.1 10*3/uL (ref 0.1–1.0)
Monocytes Relative: 9 %
Neutro Abs: 0.3 10*3/uL — ABNORMAL LOW (ref 1.7–7.7)
Neutrophils Relative %: 27 %
Platelets: 13 10*3/uL — CL (ref 150–400)
RBC: 1.97 MIL/uL — ABNORMAL LOW (ref 4.22–5.81)
RDW: 14.7 % (ref 11.5–15.5)
WBC Morphology: INCREASED
WBC: 1.2 10*3/uL — CL (ref 4.0–10.5)
nRBC: 0 % (ref 0.0–0.2)

## 2019-05-23 LAB — TYPE AND SCREEN
ABO/RH(D): O POS
Antibody Screen: NEGATIVE
Unit division: 0

## 2019-05-23 LAB — FERRITIN: Ferritin: 1379 ng/mL — ABNORMAL HIGH (ref 24–336)

## 2019-05-23 LAB — PREPARE PLATELET PHERESIS: Unit division: 0

## 2019-05-23 LAB — BPAM PLATELET PHERESIS
Blood Product Expiration Date: 202007092359
ISSUE DATE / TIME: 202007092050
Unit Type and Rh: 5100

## 2019-05-23 LAB — IRON AND TIBC
Iron: 185 ug/dL — ABNORMAL HIGH (ref 45–182)
Saturation Ratios: 80 % — ABNORMAL HIGH (ref 17.9–39.5)
TIBC: 231 ug/dL — ABNORMAL LOW (ref 250–450)
UIBC: 46 ug/dL

## 2019-05-23 LAB — BASIC METABOLIC PANEL
Anion gap: 12 (ref 5–15)
BUN: 44 mg/dL — ABNORMAL HIGH (ref 8–23)
CO2: 24 mmol/L (ref 22–32)
Calcium: 7.9 mg/dL — ABNORMAL LOW (ref 8.9–10.3)
Chloride: 97 mmol/L — ABNORMAL LOW (ref 98–111)
Creatinine, Ser: 8.83 mg/dL — ABNORMAL HIGH (ref 0.61–1.24)
GFR calc Af Amer: 6 mL/min — ABNORMAL LOW (ref >60)
GFR calc non Af Amer: 5 mL/min — ABNORMAL LOW (ref >60)
Glucose, Bld: 123 mg/dL — ABNORMAL HIGH (ref 70–99)
Potassium: 3.7 mmol/L (ref 3.5–5.1)
Sodium: 133 mmol/L — ABNORMAL LOW (ref 135–145)

## 2019-05-23 LAB — PREPARE RBC (CROSSMATCH)

## 2019-05-23 LAB — LACTIC ACID, PLASMA: Lactic Acid, Venous: 0.9 mmol/L (ref 0.5–1.9)

## 2019-05-23 LAB — URIC ACID: Uric Acid, Serum: 4.6 mg/dL (ref 3.7–8.6)

## 2019-05-23 LAB — BPAM RBC
Blood Product Expiration Date: 202007142359
ISSUE DATE / TIME: 202007091903
Unit Type and Rh: 5100

## 2019-05-23 LAB — HEMOGLOBIN AND HEMATOCRIT, BLOOD
HCT: 21.6 % — ABNORMAL LOW (ref 39.0–52.0)
Hemoglobin: 7.7 g/dL — ABNORMAL LOW (ref 13.0–17.0)

## 2019-05-23 LAB — SAVE SMEAR(SSMR), FOR PROVIDER SLIDE REVIEW

## 2019-05-23 LAB — PATHOLOGIST SMEAR REVIEW

## 2019-05-23 LAB — FIBRINOGEN: Fibrinogen: 401 mg/dL (ref 210–475)

## 2019-05-23 LAB — VITAMIN B12: Vitamin B-12: 454 pg/mL (ref 180–914)

## 2019-05-23 LAB — TSH: TSH: 9.439 u[IU]/mL — ABNORMAL HIGH (ref 0.350–4.500)

## 2019-05-23 LAB — LACTATE DEHYDROGENASE: LDH: 172 U/L (ref 98–192)

## 2019-05-23 LAB — APTT: aPTT: 33 seconds (ref 24–36)

## 2019-05-23 LAB — D-DIMER, QUANTITATIVE: D-Dimer, Quant: 5.88 ug/mL-FEU — ABNORMAL HIGH (ref 0.00–0.50)

## 2019-05-23 MED ORDER — HEPARIN SODIUM (PORCINE) 1000 UNIT/ML IJ SOLN
INTRAMUSCULAR | Status: AC
  Filled 2019-05-23: qty 4

## 2019-05-23 MED ORDER — SODIUM CHLORIDE 0.9% IV SOLUTION: Freq: Once | INTRAVENOUS | Status: DC

## 2019-05-23 MED ORDER — DIPHENHYDRAMINE HCL 25 MG PO CAPS
25.0000 mg | ORAL_CAPSULE | Freq: Once | ORAL | Status: AC
  Administered 2019-05-23: 25 mg via ORAL
  Filled 2019-05-23: qty 1

## 2019-05-23 MED ORDER — SIMVASTATIN 20 MG PO TABS
20.0000 mg | ORAL_TABLET | Freq: Every day | ORAL | Status: DC
  Administered 2019-05-23 – 2019-05-24 (×2): 20 mg via ORAL
  Filled 2019-05-23 (×2): qty 1

## 2019-05-23 MED ORDER — ALLOPURINOL 100 MG PO TABS
100.0000 mg | ORAL_TABLET | Freq: Every day | ORAL | Status: DC
  Administered 2019-05-23 – 2019-05-25 (×3): 100 mg via ORAL
  Filled 2019-05-23 (×3): qty 1

## 2019-05-23 MED ORDER — SENNOSIDES-DOCUSATE SODIUM 8.6-50 MG PO TABS: 1.0000 | ORAL_TABLET | Freq: Every evening | ORAL | Status: DC | PRN

## 2019-05-23 MED ORDER — ONDANSETRON HCL 4 MG PO TABS: 4.0000 mg | ORAL_TABLET | Freq: Four times a day (QID) | ORAL | Status: DC | PRN

## 2019-05-23 MED ORDER — ONDANSETRON HCL 4 MG/2ML IJ SOLN: 4.0000 mg | Freq: Four times a day (QID) | INTRAMUSCULAR | Status: DC | PRN

## 2019-05-23 MED ORDER — ACETAMINOPHEN 650 MG RE SUPP: 650.0000 mg | Freq: Four times a day (QID) | RECTAL | Status: DC | PRN

## 2019-05-23 MED ORDER — LEVOTHYROXINE SODIUM 75 MCG PO TABS
175.0000 ug | ORAL_TABLET | Freq: Every day | ORAL | Status: DC
  Administered 2019-05-23 – 2019-05-25 (×3): 175 ug via ORAL
  Filled 2019-05-23 (×3): qty 1

## 2019-05-23 MED ORDER — SUCRALFATE 1 G PO TABS
1.0000 g | ORAL_TABLET | Freq: Three times a day (TID) | ORAL | Status: DC | PRN
  Administered 2019-05-23 (×2): 1 g via ORAL
  Filled 2019-05-23 (×2): qty 1

## 2019-05-23 MED ORDER — CHLORHEXIDINE GLUCONATE CLOTH 2 % EX PADS
6.0000 | MEDICATED_PAD | Freq: Every day | CUTANEOUS | Status: DC
  Administered 2019-05-24 – 2019-05-25 (×2): 6 via TOPICAL

## 2019-05-23 MED ORDER — ACETAMINOPHEN 325 MG PO TABS
650.0000 mg | ORAL_TABLET | Freq: Once | ORAL | Status: AC
  Administered 2019-05-23: 650 mg via ORAL
  Filled 2019-05-23: qty 2

## 2019-05-23 MED ORDER — PANTOPRAZOLE SODIUM 40 MG PO TBEC
40.0000 mg | DELAYED_RELEASE_TABLET | Freq: Every day | ORAL | Status: DC
  Administered 2019-05-23 – 2019-05-25 (×3): 40 mg via ORAL
  Filled 2019-05-23 (×3): qty 1

## 2019-05-23 MED ORDER — HEPARIN SODIUM (PORCINE) 1000 UNIT/ML IJ SOLN
3800.0000 [IU] | Freq: Once | INTRAMUSCULAR | Status: AC
  Administered 2019-05-23: 3800 [IU]

## 2019-05-23 MED ORDER — ACETAMINOPHEN 325 MG PO TABS: 650.0000 mg | ORAL_TABLET | Freq: Four times a day (QID) | ORAL | Status: DC | PRN

## 2019-05-23 MED ORDER — SEVELAMER CARBONATE 800 MG PO TABS
1600.0000 mg | ORAL_TABLET | Freq: Three times a day (TID) | ORAL | Status: DC
  Administered 2019-05-23 – 2019-05-25 (×5): 1600 mg via ORAL
  Filled 2019-05-23 (×5): qty 2

## 2019-05-23 NOTE — Progress Notes (Signed)
   VASCULAR SURGERY ASSESSMENT & PLAN:   STATUS POST LEFT UPPER ARM AV GRAFT: His graft has a good thrill.  He has a palpable radial pulse.  I suspect his ecchymosis will take several weeks to resolve.  He does have some moderate swelling and I have encouraged him to elevate his arm.  SUBJECTIVE:   No specific complaints.  PHYSICAL EXAM:   Vitals:   05/22/19 2049 05/22/19 2114 05/22/19 2252 05/23/19 0634  BP: (!) 141/76 (!) 145/82 132/72 140/79  Pulse: 80 85 95 84  Resp: 16 18 18 17   Temp: 98.3 F (36.8 C) 98.3 F (36.8 C) 98.8 F (37.1 C) 98.1 F (36.7 C)  TempSrc: Oral Oral Oral Oral  SpO2: 99% 100% 99% 97%  Weight:      Height:       Good thrill in left upper arm graft. Palpable left radial pulse. Significant ecchymosis for the graft was tunneled.  LABS:   Lab Results  Component Value Date   WBC 1.1 (LL) 05/22/2019   HGB 6.0 (LL) 05/22/2019   HCT 18.0 (L) 05/22/2019   MCV 92.3 05/22/2019   PLT 12 (LL) 05/22/2019   Lab Results  Component Value Date   CREATININE 7.04 (H) 05/22/2019   Lab Results  Component Value Date   INR 1.0 05/22/2019   CBG (last 3)  No results for input(s): GLUCAP in the last 72 hours.  PROBLEM LIST:    Principal Problem:   Pancytopenia ([place]) Active Problems:   HLD (hyperlipidemia)   Gout, unspecified   Essential hypertension   GERD (gastroesophageal reflux disease)   Hypothyroidism   Small cell lung cancer (HCC)   ESRD (end stage renal disease) on dialysis (HCC)   CURRENT MEDS:   . allopurinol  100 mg Oral Daily  . Chlorhexidine Gluconate Cloth  6 each Topical Q0600  . levothyroxine  175 mcg Oral Q0600  . pantoprazole  40 mg Oral Daily  . sevelamer carbonate  1,600 mg Oral TID WC  . simvastatin  20 mg Oral QHS    [name] Beeper: [phone] Office: [phone] 05/23/2019

## 2019-05-23 NOTE — Progress Notes (Signed)
HEMATOLOGY-ONCOLOGY PROGRESS NOTE  SUBJECTIVE: Mr. Jeremy Johnson is a 73 year old male with limited stage small cell lung cancer.  He is currently receiving systemic chemotherapy with carboplatin for an AUC of 5 on day 1 and etoposide 80 mg/m on days 1 through 3 each cycle.  He received his fourth cycle of chemotherapy on 05/12/2019 through 05/14/2019.  Patient received Neulasta as part of his treatment plan on 05/14/2019.  His chemotherapy was given currently with radiation which he completed on 05/15/2019.  During therapy, his kidney function worsened and he is now receiving dialysis. He had a left upper arm AV graft placed on 05/19/2019.  He was seen at the cancer center on the day of admission secondary to anemia.  His hemoglobin was down to 5.8 and he reported hematuria.  Additionally, his CBC showed a total white blood cell count of 1.3, ANC 0.2, and platelet count of 12,000.  He was referred to the emergency room for evaluation and admission.  When seen today, he feels well overall. Still has left arm swelling. Had a small amount of blood in his urine earlier today, but no other bleeding noted. Denies fevers and chills. No chest pain or shortness of breath. Due for dialysis shortly.   Oncology History  Small cell lung cancer ([place])  02/06/2019 Initial Diagnosis   Small cell lung cancer, left ([place])   02/17/2019 -  Chemotherapy   The patient had palonosetron (ALOXI) injection 0.25 mg, 0.25 mg, Intravenous,  Once, 4 of 6 cycles Administration: 0.25 mg (02/17/2019), 0.25 mg (03/10/2019), 0.25 mg (03/31/2019), 0.25 mg (05/12/2019) pegfilgrastim (NEULASTA ONPRO KIT) injection 6 mg, 6 mg, Subcutaneous, Once, 3 of 5 cycles Administration: 6 mg (03/12/2019), 6 mg (04/02/2019), 6 mg (05/14/2019) CARBOplatin (PARAPLATIN) 320 mg in sodium chloride 0.9 % 250 mL chemo infusion, 320 mg (100 % of original dose 315.5 mg), Intravenous,  Once, 4 of 6 cycles Dose modification: 315.5 mg (original dose 315.5 mg, Cycle 1), 315.5 mg  (original dose 315.5 mg, Cycle 2), 251.5 mg (original dose 251.5 mg, Cycle 3), 185.5 mg (original dose 185.5 mg, Cycle 4) Administration: 320 mg (02/17/2019), 320 mg (03/10/2019), 250 mg (03/31/2019), 190 mg (05/12/2019) etoposide (VEPESID) 200 mg in sodium chloride 0.9 % 500 mL chemo infusion, 95 mg/m2 = 210 mg, Intravenous,  Once, 4 of 6 cycles Dose modification: 80 mg/m2 (original dose 100 mg/m2, Cycle 1, Reason: Other (see comments), Comment: adjusted for renal fxn per MD) Administration: 170 mg (02/18/2019), 170 mg (02/19/2019), 170 mg (03/10/2019), 170 mg (03/11/2019), 170 mg (03/12/2019), 170 mg (03/31/2019), 170 mg (04/01/2019), 170 mg (04/02/2019), 170 mg (05/12/2019), 170 mg (05/13/2019), 170 mg (05/14/2019)  for chemotherapy treatment.       REVIEW OF SYSTEMS:   Constitutional: Denies fevers, chills Eyes: Denies blurriness of vision Ears, nose, mouth, throat, and face: Denies mucositis or sore throat Respiratory: Denies cough, dyspnea or wheezes Cardiovascular: Denies palpitation, chest discomfort  Gastrointestinal:  Denies nausea, heartburn or change in bowel habits Skin: Denies abnormal skin rashes Lymphatics: Denies new lymphadenopathy or easy bruising Neurological:Denies numbness, tingling or new weaknesses Behavioral/Psych: Mood is stable, no new changes  Extremities: No lower extremity edema. Has Left arm swelling.  All other systems were reviewed with the patient and are negative.  I have reviewed the past medical history, past surgical history, social history and family history with the patient and they are unchanged from previous note.   PHYSICAL EXAMINATION: ECOG PERFORMANCE STATUS: 1 - Symptomatic but completely ambulatory  Vitals:   05/22/19  2252 05/23/19 0634  BP: 132/72 140/79  Pulse: 95 84  Resp: 18 17  Temp: 98.8 F (37.1 C) 98.1 F (36.7 C)  SpO2: 99% 97%   Filed Weights   05/22/19 1406  Weight: 194 lb (88 kg)    Intake/Output from previous day: No intake/output  data recorded.  GENERAL:alert, no distress and comfortable SKIN: skin color, texture, turgor are normal, no rashes or significant lesions EYES: normal, Conjunctiva are pink and non-injected, sclera clear OROPHARYNX:no exudate, no erythema and lips, buccal mucosa, and tongue normal  NECK: supple, thyroid normal size, non-tender, without nodularity LYMPH:  no palpable lymphadenopathy in the cervical, axillary or inguinal LUNGS: clear to auscultation and percussion with normal breathing effort HEART: regular rate & rhythm and no murmurs and no lower extremity edema. Left arm edema and bruising. L upper arm AV graft with thrill and bruit.  ABDOMEN:abdomen soft, non-tender and normal bowel sounds Musculoskeletal:no cyanosis of digits and no clubbing  NEURO: alert & oriented x 3 with fluent speech, no focal motor/sensory deficits  LABORATORY DATA:  I have reviewed the data as listed CMP Latest Ref Rng & Units 05/22/2019 05/19/2019 05/12/2019  Glucose 70 - 99 mg/dL 119(H) 105(H) 165(H)  BUN 8 - 23 mg/dL 33(H) - 25(H)  Creatinine 0.61 - 1.24 mg/dL 7.04(H) - 7.07(HH)  Sodium 135 - 145 mmol/L 134(L) 132(L) 137  Potassium 3.5 - 5.1 mmol/L 3.8 3.6 3.4(L)  Chloride 98 - 111 mmol/L 96(L) - 100  CO2 22 - 32 mmol/L 25 - 25  Calcium 8.9 - 10.3 mg/dL 8.3(L) - 8.0(L)  Total Protein 6.5 - 8.1 g/dL 6.0(L) - 6.9  Total Bilirubin 0.3 - 1.2 mg/dL 0.8 - 0.3  Alkaline Phos 38 - 126 U/L 92 - 99  AST 15 - 41 U/L 13(L) - 16  ALT 0 - 44 U/L 11 - 10    Lab Results  Component Value Date   WBC  DUP SEE [id number] 05/23/2019   HGB  DUP SEE [id number] 05/23/2019   HCT  DUP SEE [id number] 05/23/2019   MCV  DUP SEE [id number] 05/23/2019   PLT  DUP SEE [id number] 05/23/2019   NEUTROABS PENDING 05/23/2019    Dg Chest Portable 1 View  Result Date: 05/22/2019 CLINICAL DATA:  Shortness of breath. EXAM: PORTABLE CHEST 1 VIEW COMPARISON:  04/14/2019. FINDINGS: Normal sized heart. Tortuous aorta. Clear lungs with stable biapical pleural and  parenchymal scarring. The lungs are mildly hyperexpanded. Interval right jugular catheter with its tip at the superior cavoatrial junction. No pneumothorax. Thoracic spine degenerative changes. IMPRESSION: No acute abnormality. Mild changes of COPD. Electronically Signed   By: Claudie Revering M.D.   On: 05/22/2019 15:49   Vas US Duplex Dialysis Access (avf, Avg)  Result Date: 05/14/2019 DIALYSIS ACCESS Reason for Exam: No palpable thrill for AVF/AVG. Access Site: Left Upper Extremity. Access Type: Brachial-cephalic AVF 5/36/6440 Performing Technologist: Ronal Fear RVS, RCS  Examination Guidelines: A complete evaluation includes B-mode imaging, spectral Doppler, color Doppler, and power Doppler as needed of all accessible portions of each vessel. Unilateral testing is considered an integral part of a complete examination. Limited examinations for reoccurring indications may be performed as noted.  Findings: +--------------------+----------+-----------------+--------+ AVF                 PSV (cm/s)Flow Vol (mL/min)Comments +--------------------+----------+-----------------+--------+ Native artery inflow    98                              +--------------------+----------+-----------------+--------+  AVF Anastomosis         62                              +--------------------+----------+-----------------+--------+  +------------+----------+-------------+----------+-------------------+ OUTFLOW VEINPSV (cm/s)Diameter (cm)Depth (cm)     Describe       +------------+----------+-------------+----------+-------------------+ Shoulder                  0.21        1.18                       +------------+----------+-------------+----------+-------------------+ Prox UA                   0.23        0.68                       +------------+----------+-------------+----------+-------------------+ Mid UA                    0.37        0.52                        +------------+----------+-------------+----------+-------------------+ Dist UA                   0.43        0.31   partially-occlusive +------------+----------+-------------+----------+-------------------+ AC Fossa                  0.22                    occluded       +------------+----------+-------------+----------+-------------------+  +-----------------+-------------+----------+---------+----------+--------------+ basilic vein:    Diameter (cm)Depth (cm)BranchingPSV (cm/s) Flow Volume                                                                 (ml/min)    +-----------------+-------------+----------+---------+----------+--------------+ proximal upper       0.62                                                 arm                                                                       +-----------------+-------------+----------+---------+----------+--------------+ mid upper arm        0.55                                                 +-----------------+-------------+----------+---------+----------+--------------+ distal upper arm     0.52                                                 +-----------------+-------------+----------+---------+----------+--------------+  antecubital fossa    0.33                 0.22                            +-----------------+-------------+----------+---------+----------+--------------+  Summary: Arteriovenous fistula-Thrombus noted in the antecubital fossa region with no color or Doppler flow.  Widely patent left basilic vein.  *See table(s) above for measurements and observations.  Diagnosing physician: [name] MD Electronically signed by [name] MD on 05/14/2019 at 4:59:25 PM.    --------------------------------------------------------------------------------   Final    Vas [place] Upper Ext Vein Mapping (pre-op Avf)  Result Date: 04/24/2019 UPPER EXTREMITY VEIN MAPPING  Indications:  Pre-access. Comparison Study: No prior studies.  Examination Guidelines: A complete evaluation includes B-mode imaging, spectral Doppler, color Doppler, and power Doppler as needed of all accessible portions of each vessel. Bilateral testing is considered an integral part of a complete examination. Limited examinations for reoccurring indications may be performed as noted. +-----------------+-------------+----------+---------+ Right Cephalic   Diameter (cm)Depth (cm)Findings  +-----------------+-------------+----------+---------+ Shoulder             0.48        0.83             +-----------------+-------------+----------+---------+ Prox upper arm       0.40        0.93             +-----------------+-------------+----------+---------+ Mid upper arm        0.38        0.39             +-----------------+-------------+----------+---------+ Dist upper arm       0.32        0.38             +-----------------+-------------+----------+---------+ Antecubital fossa    0.33        0.58   branching +-----------------+-------------+----------+---------+ Prox forearm         0.27        0.52             +-----------------+-------------+----------+---------+ Mid forearm          0.22        0.48             +-----------------+-------------+----------+---------+ Dist forearm         0.25        0.31             +-----------------+-------------+----------+---------+ +-----------------+-------------+----------+---------+ Left Cephalic    Diameter (cm)Depth (cm)Findings  +-----------------+-------------+----------+---------+ Shoulder             0.42        1.08             +-----------------+-------------+----------+---------+ Prox upper arm       0.35        1.14             +-----------------+-------------+----------+---------+ Mid upper arm        0.32        0.40   branching +-----------------+-------------+----------+---------+ Dist upper arm       0.44         0.38             +-----------------+-------------+----------+---------+ Antecubital fossa    0.43        0.40   branching +-----------------+-------------+----------+---------+ Prox forearm         0.38  0.48   branching +-----------------+-------------+----------+---------+ Mid forearm          0.29        0.45             +-----------------+-------------+----------+---------+ Dist forearm         0.22        0.27             +-----------------+-------------+----------+---------+ *See table(s) above for measurements and observations.  Diagnosing physician: Servando Snare MD Electronically signed by Servando Snare MD on 04/24/2019 at 5:32:07 PM.    Final     ASSESSMENT AND PLAN: 1.  Limited stage small cell lung cancer 2.  Pancytopenia secondary to chemotherapy 3.  End-stage renal disease on hemodialysis  -Pancytopenia due to recent chemo. He has already received Neulasta. No need for Granix. He is afebrile. BC are negative. No need for antibiotics unless he develops fever. He remains anemic and will receive 2 units PRBC later today in dialysis. Recommend PRBC transfusion for hemoglobin <8.0 or active bleeding. Platelet count is 15,000. Recommend platelet transfusion for platelet count less than 10,000 or active bleeding. No need for platelets today unless he develops recurrent hematuria or other bleeding. -He will have a restaging CT scan of the chest on 05/30/2019. He will keep this appointment and follow-up with Dr. Julien Nordmann as scheduled to discuss these results.  -HD per Nephrology.   Please call Oncology over the weekend if there are questions. Will follow-up next week if he remains inpatient.   LOS: 1 day   Mikey Bussing, DNP, AGPCNP-BC, AOCNP 05/23/19

## 2019-05-23 NOTE — Progress Notes (Signed)
Renal Navigator notified OP HD clinic/[facility] of patient's admission and negative COVID 19 rapid test result in order to provide continuity of care and safety.  Alphonzo Cruise, [facility] Renal Navigator [phone]

## 2019-05-23 NOTE — Progress Notes (Signed)
[facility] Progress Note  Subjective: no c/o today, no bleeding or SOB  Vitals:   05/23/19 1400 05/23/19 1430 05/23/19 1440 05/23/19 1500  BP: 139/79 126/78 130/80 131/83  Pulse: 85 82 82 84  Resp: 14 15 16 16   Temp:  98.3 F (36.8 C) 98.1 F (36.7 C)   TempSrc:  Oral Oral   SpO2:      Weight:      Height:        Inpatient medications: . sodium chloride   Intravenous Once  . allopurinol  100 mg Oral Daily  . Chlorhexidine Gluconate Cloth  6 each Topical Q0600  . levothyroxine  175 mcg Oral Q0600  . pantoprazole  40 mg Oral Daily  . sevelamer carbonate  1,600 mg Oral TID WC  . simvastatin  20 mg Oral QHS   . sodium chloride    . sodium chloride     acetaminophen **OR** acetaminophen, ondansetron **OR** ondansetron (ZOFRAN) IV, senna-docusate, sucralfate    Exam: General: adult male in bed in NAD at rest HEENT:NCAT Eyes: EOMI; sclera anicteric Neck: supple no JVD Heart: RRR; no rub Lungs:clear to auscultation bilaterally ; unlabored Abdomen:  Soft/NT/ND normal bowel sounds Extremities: no lower extremity edema; edema LUE  Skin: bruising LUE  Neuro: alert and oriented x 3; provides a history and follows commands Access: right chest tunneled catheter and LUE AVG with bruit and thrill; bruising and swelling of LU extremity  HD outpatient orders:   4h  86.5kg  400/800   3K/2.5 bath  TDC   Assessment/Plan:  # ESRD - Recent dialysis dependent AKI and was felt to have progressed to ESRD per outpatient charting - pt has no edema except for focal LUE - Patient is on hemodialysis TTS - No emergent indication for HD tonight - Plan for dialysis today and then again tomorrow to get back on sched  # Anemia - Setting of chemotherapy as well as AKI  - Plan for the second unit of blood to be given on dialysis today - Will defer any ESA given his malignancy    # Pancytopenia - Setting of chemotherapy , seen by ONC  # Small cell lung cancer -  s/p cycle 4 of carboplatin and etoposide with neulasta support per hem/onc note.  Last cycle completed 7/1 - per hematology/oncology   # HTN - Given hypotension on HD would hold amlodipine.  Metoprolol is currently on hold as well.  Ok to resume low dose beta blocker if agent needed for BP control  - BP's are normal today  # Syncope on HD: combination anemia, BP meds, possibly need edw raised - hopeful for improvement with transfusion and holding anti-hypertensives  - keep even on HD tomorrow  # LUE edema: this is local edema due to recent AVF placement and not reflective of vol overload    [facility] 05/23/2019, 3:31 PM  Iron/TIBC/Ferritin/ %Sat    Component Value Date/Time   IRON 185 (H) 05/23/2019 0746   TIBC 231 (L) 05/23/2019 0746   FERRITIN 1,379 (H) 05/23/2019 0746   IRONPCTSAT 80 (H) 05/23/2019 0746   Recent Labs  Lab 05/22/19 1418 05/22/19 1604 05/23/19 1202  NA 134*  --  133*  K 3.8  --  3.7  CL 96*  --  97*  CO2 25  --  24  GLUCOSE 119*  --  123*  BUN 33*  --  44*  CREATININE 7.04*  --  8.83*  CALCIUM 8.3*  --  7.9*  ALBUMIN 2.7*  --   --   INR  --  1.0  --    Recent Labs  Lab 05/22/19 1418  AST 13*  ALT 11  ALKPHOS 92  BILITOT 0.8  PROT 6.0*   Recent Labs  Lab 05/23/19 0849  WBC  DUP SEE [id number]  HGB  DUP SEE [id number]  HCT  DUP SEE [id number]  PLT  DUP SEE [id number]

## 2019-05-23 NOTE — Progress Notes (Signed)
PROGRESS NOTE  Jeremy Johnson SHF:[id number] DOB: [birth year] DOA: 05/22/2019 PCP: [name], MD  Brief History   Jeremy Johnson is a 73 y.o. male with medical history significant of small cell lung cancer on active chemotherapy, ESRD on HD (TTS), gout, essential hypertension, hypothyroidism, GERD, HLD who presents from the cancer center with weakness, fatigue.  He is found to have pancytopenia with a white blood cell count of 1.1, hemoglobin 6.0, platelet 12.  He has been experiencing weakness, fatigue and overall malaise over the past week.  He is currently on chemotherapy with carboplatin, etoposide with Neulasta last treatment on 05/14/2019.  Additionally, he has reported passing out during hemodialysis on several past occasions.  No other specific complaints at this time.  Of note, patient recently had left upper extremity AV graft placed on 05/19/2019 by vascular surgery, Dr. Scot Dock.  Shortly following procedure, patient has complained of progressive left upper extremity edema from his graft site proceeding distally to his hand with associated ecchymosis.  Patient also reports missed HD session on Tuesday, but had full session Wednesday/yesterday and was planning on getting back on his normal schedule today but missed hemodialysis since he was at the cancer center.  ED Course: Temperature 97.8, HR 79, RR 14, BP 118/70, SPO2 99% on room air.  WBC count 1.1, hemoglobin 6.0, platelets 12.  Sodium 134, potassium 3.8, chloride 96, CO2 25, BUN 33, creatinine 7.04, glucose 119.  Anion gap 13.  Albumin 2.7.  INR 1.0.  Calcium 8.3.  Chest x-ray with no acute findings, mild COPD changes noted.  ED provider ordered 1 unit of platelets and 2 units PRBCs for transfusion.  Hospital service was consulted for admission given severe pancytopenia and concern of left upper extremity edema with recent AV graft placement as possible site of acute blood loss anemia. The patient received 2 units of PRBC's as inpatient  and 1 unit of platelets in the ED.  He was admitted to a medical bed. Nephrology, oncology, and vascular surgery were consulted.  Consultants  . Nephrology . Vascular Surgery . Oncology  Procedures  . HD  Antibiotics   Anti-infectives (From admission, onward)   None    .   Subjective  The patient is resting comfortably. He states that he is feeling better. No new complaints.  Objective   Vitals:  Vitals:   05/23/19 1530 05/23/19 1600  BP: 130/80 133/80  Pulse: 80 78  Resp: 16 18  Temp: 98.1 F (36.7 C)   SpO2:      Exam:  Constitutional:  . The patient is awake, alert, and oriented x 3. No acute distress. Respiratory:  . There is no increased work of breathing. . No wheezes, rales, or rhonchi. . No tactile fremitus. Cardiovascular:  . Regular rate and rhythm . No murmurs, ectopy, or gallups . No lateral PMI. No thrills. Abdomen:  . Abdomen is soft, non-tender, non-distended . No hernias, masses, or organomegaly . Normoactive bowel sounds. Musculoskeletal:  . No cyanosis, clubbing, or edema Skin:  . No rashes, lesions, ulcers . palpation of skin: no induration or nodules Neurologic:  . CN 2-12 intact . Sensation all 4 extremities intact Psychiatric:  . Mental status o Mood, affect appropriate o Orientation to person, place, time  . judgment and insight appear intact   I have personally reviewed the following:   Today's Data  . CBC, BMP, Vitals  Scheduled Meds: . sodium chloride   Intravenous Once  . allopurinol  100 mg Oral  Daily  . Chlorhexidine Gluconate Cloth  6 each Topical Q0600  . [START ON 05/24/2019] Chlorhexidine Gluconate Cloth  6 each Topical Q0600  . heparin  3,800 Units Intracatheter Once  . levothyroxine  175 mcg Oral Q0600  . pantoprazole  40 mg Oral Daily  . sevelamer carbonate  1,600 mg Oral TID WC  . simvastatin  20 mg Oral QHS   Continuous Infusions: . sodium chloride    . sodium chloride      Principal Problem:    Pancytopenia (HCC) Active Problems:   HLD (hyperlipidemia)   Gout, unspecified   Essential hypertension   GERD (gastroesophageal reflux disease)   Hypothyroidism   Small cell lung cancer (HCC)   ESRD (end stage renal disease) on dialysis ([place])   LOS: 1 day    A & P   Pancytopenia  Patient presenting from the cancer center with profound pancytopenia with white blood cell count 1.1, hemoglobin 6.0, platelet count 12.  No signs of active blood loss, although slightly concerned at left upper extremity AV graft site with edema and ecchymosis.  Most recent CBC on 05/12/2019 with WBC count 8.7, hemoglobin 8.7, and platelet 279.  Currently on chemotherapy for his small cell lung cancer with carboplatin, etoposide and receiving Neulasta; last treatment on 05/14/2019.  Etiology likely chemotherapy induced.  Also considerations for immune induced, nutritional deficiency, infectious, DIC, HUS, HL H, TTP. The patient received transfusion of 2 units PRBCs and 1 unit platelets ordered by EDP. This morning the patient's hemoglobin was again 6.1 and platelets were 15 up from 12 on admission. The patient will be transfused with 2 more units PRBC's. Oncology is following the patient. He has completed chemotherapy for his small cell CA. He is on neutropenic precautions. ASA was held. CBC will be followed. Transfuse for hemoglobin less than 7.0 and platelets less than 10 or if active bleeding.  Small cell lung cancer active on chemotherapy: Patient is followed by medical oncology, Dr. Julien Nordmann.  Currently on chemotherapy with carboplatin, etoposide and Neulasta with last treatment on 05/14/2019.  Has completed cycle 4-day 3.  Concern that his pancytopenia is likely chemotherapy induced.  Case was discussed by ED provider with on-call oncology, Dr. Jana Hakim who will notify his primary oncologist regarding his admission. Oncology following. Dr. Julien Nordmann tagged the chart for evaluation and further recommendations.  ESRD on  HD (TTS): Patient follows in [facility].  Reports misses HD session on Tuesday but had a full session Wednesday, 05/21/2019.  Was post to have another session today on 05/22/2019 to get back on his normal schedule but missed since he was at the cancer center. Hemodynamically stable, oxygenating 99% on room air, potassium 3.8. Nephrology consulted, Dr. Royce Macadamia; likely plan HD today.  Left upper extremity edema and ecchymosis: Recent AV graft placed left upper extremity with notable edema from AV graft site to left hand with surrounding ecchymosis. Vascular surgery consulted, Dr. Scot Dock for evaluation of AV graft site. I received a call from vascular that vascular duplex of left upper extremity was not necessary and it has been discontinued.  Essential hypertension: Patient on amlodipine 5 mg p.o. daily, and Lopressor 25 mg p.o. twice daily.  Blood pressure 118/70 in ED, patient states has not taken his blood pressure medicines over the past few days.  He reports has had "passing out spells" during dialysis due to low blood pressures. Continue to hold home antihypertensives. Closely monitor blood pressure.  Hypothyroidism: TSH is elevated at 9.439. He receives  171mcg daily. Will check FT4 and FT3.  GERD: Continue PPI  Hyperlipidemia: Continue statin  DVT prophylaxis: SCDs, chemical DVT prophylaxis contraindicated in the setting of pancytopenia Code Status: Full code Family Communication: Spouse, Inez Catalina updated via telephone Disposition Plan: Anticipate discharge home when medically ready.  I have seen and examined this patient myself. I have spent 35 minutes in his evaluation and care.  Giovanna Kemmerer, DO [facility] Direct contact: see [email or web address]  7PM-7AM contact night coverage as above 05/23/2019, 4:25 PM  LOS: 1 day

## 2019-05-23 NOTE — Plan of Care (Signed)

## 2019-05-23 NOTE — Telephone Encounter (Signed)
No los per 7/9.

## 2019-05-24 DIAGNOSIS — C3490 Malignant neoplasm of unspecified part of unspecified bronchus or lung: Secondary | ICD-10-CM

## 2019-05-24 LAB — CBC WITH DIFFERENTIAL/PLATELET
Abs Immature Granulocytes: 0.01 10*3/uL (ref 0.00–0.07)
Abs Immature Granulocytes: 0.04 10*3/uL (ref 0.00–0.07)
Basophils Absolute: 0 10*3/uL (ref 0.0–0.1)
Basophils Absolute: 0 10*3/uL (ref 0.0–0.1)
Basophils Relative: 1 %
Basophils Relative: 1 %
Eosinophils Absolute: 0 10*3/uL (ref 0.0–0.5)
Eosinophils Absolute: 0 10*3/uL (ref 0.0–0.5)
Eosinophils Relative: 2 %
Eosinophils Relative: 2 %
HCT: 20.7 % — ABNORMAL LOW (ref 39.0–52.0)
HCT: 20.3 % — ABNORMAL LOW (ref 39.0–52.0)
Hemoglobin: 7.2 g/dL — ABNORMAL LOW (ref 13.0–17.0)
Hemoglobin: 6.9 g/dL — CL (ref 13.0–17.0)
Immature Granulocytes: 1 %
Immature Granulocytes: 2 %
Lymphocytes Relative: 37 %
Lymphocytes Relative: 32 %
Lymphs Abs: 0.7 10*3/uL (ref 0.7–4.0)
Lymphs Abs: 0.8 10*3/uL (ref 0.7–4.0)
MCH: 30.4 pg (ref 26.0–34.0)
MCH: 30.3 pg (ref 26.0–34.0)
MCHC: 34.8 g/dL (ref 30.0–36.0)
MCHC: 34 g/dL (ref 30.0–36.0)
MCV: 87.3 fL (ref 80.0–100.0)
MCV: 89 fL (ref 80.0–100.0)
Monocytes Absolute: 0.2 10*3/uL (ref 0.1–1.0)
Monocytes Absolute: 0.4 10*3/uL (ref 0.1–1.0)
Monocytes Relative: 13 %
Monocytes Relative: 15 %
Neutro Abs: 0.9 10*3/uL — ABNORMAL LOW (ref 1.7–7.7)
Neutro Abs: 1.2 10*3/uL — ABNORMAL LOW (ref 1.7–7.7)
Neutrophils Relative %: 46 %
Neutrophils Relative %: 48 %
Platelets: 8 10*3/uL — CL (ref 150–400)
Platelets: 18 10*3/uL — CL (ref 150–400)
RBC: 2.37 MIL/uL — ABNORMAL LOW (ref 4.22–5.81)
RBC: 2.28 MIL/uL — ABNORMAL LOW (ref 4.22–5.81)
RDW: 14.7 % (ref 11.5–15.5)
RDW: 14.4 % (ref 11.5–15.5)
WBC Morphology: INCREASED
WBC: 1.9 10*3/uL — ABNORMAL LOW (ref 4.0–10.5)
WBC: 2.4 10*3/uL — ABNORMAL LOW (ref 4.0–10.5)
nRBC: 0 % (ref 0.0–0.2)
nRBC: 0 % (ref 0.0–0.2)

## 2019-05-24 LAB — BPAM PLATELET PHERESIS
Blood Product Expiration Date: 202007112359
Unit Type and Rh: 7300

## 2019-05-24 LAB — CBC
HCT: 17.3 % — ABNORMAL LOW (ref 39.0–52.0)
Hemoglobin: 6.1 g/dL — CL (ref 13.0–17.0)
MCH: 31 pg (ref 26.0–34.0)
MCHC: 35.3 g/dL (ref 30.0–36.0)
MCV: 87.8 fL (ref 80.0–100.0)
Platelets: 15 10*3/uL — CL (ref 150–400)
RBC: 1.97 MIL/uL — ABNORMAL LOW (ref 4.22–5.81)
RDW: 14.7 % (ref 11.5–15.5)
WBC: 1.2 10*3/uL — CL (ref 4.0–10.5)
nRBC: 0 % (ref 0.0–0.2)

## 2019-05-24 LAB — PREPARE PLATELET PHERESIS: Unit division: 0

## 2019-05-24 LAB — PREPARE RBC (CROSSMATCH)

## 2019-05-24 MED ORDER — ACETAMINOPHEN 325 MG PO TABS
650.0000 mg | ORAL_TABLET | Freq: Once | ORAL | Status: DC
  Filled 2019-05-24: qty 2

## 2019-05-24 MED ORDER — PENTAFLUOROPROP-TETRAFLUOROETH EX AERO: 1.0000 "application " | INHALATION_SPRAY | CUTANEOUS | Status: DC | PRN

## 2019-05-24 MED ORDER — ALTEPLASE 2 MG IJ SOLR
2.0000 mg | Freq: Once | INTRAMUSCULAR | Status: DC | PRN
  Filled 2019-05-24: qty 2

## 2019-05-24 MED ORDER — FUROSEMIDE 10 MG/ML IJ SOLN
20.0000 mg | Freq: Once | INTRAMUSCULAR | Status: AC
  Administered 2019-05-24: 18:00:00 20 mg via INTRAVENOUS
  Filled 2019-05-24: qty 2

## 2019-05-24 MED ORDER — SODIUM CHLORIDE 0.9% IV SOLUTION
Freq: Once | INTRAVENOUS | Status: AC
  Administered 2019-05-24: 10 mL/h via INTRAVENOUS

## 2019-05-24 MED ORDER — DIPHENHYDRAMINE HCL 25 MG PO CAPS
25.0000 mg | ORAL_CAPSULE | Freq: Once | ORAL | Status: DC
  Filled 2019-05-24: qty 1

## 2019-05-24 MED ORDER — ACETAMINOPHEN 325 MG PO TABS: 650.0000 mg | ORAL_TABLET | Freq: Once | ORAL | Status: DC

## 2019-05-24 MED ORDER — HEPARIN SODIUM (PORCINE) 1000 UNIT/ML DIALYSIS
1000.0000 [IU] | INTRAMUSCULAR | Status: DC | PRN
  Administered 2019-05-24: 1000 [IU] via INTRAVENOUS_CENTRAL
  Filled 2019-05-24 (×2): qty 1

## 2019-05-24 MED ORDER — SODIUM CHLORIDE 0.9% IV SOLUTION: Freq: Once | INTRAVENOUS | Status: DC

## 2019-05-24 MED ORDER — HEPARIN SODIUM (PORCINE) 1000 UNIT/ML IJ SOLN
INTRAMUSCULAR | Status: AC
  Filled 2019-05-24: qty 4

## 2019-05-24 MED ORDER — POLYETHYLENE GLYCOL 3350 17 G PO PACK
17.0000 g | PACK | Freq: Every day | ORAL | Status: DC
  Administered 2019-05-24: 17 g via ORAL
  Filled 2019-05-24 (×2): qty 1

## 2019-05-24 MED ORDER — SODIUM CHLORIDE 0.9 % IV SOLN: 100.0000 mL | INTRAVENOUS | Status: DC | PRN

## 2019-05-24 MED ORDER — LIDOCAINE-PRILOCAINE 2.5-2.5 % EX CREA: 1.0000 "application " | TOPICAL_CREAM | CUTANEOUS | Status: DC | PRN

## 2019-05-24 MED ORDER — LIDOCAINE HCL (PF) 1 % IJ SOLN: 5.0000 mL | INTRAMUSCULAR | Status: DC | PRN

## 2019-05-24 MED ORDER — DIPHENHYDRAMINE HCL 25 MG PO CAPS: 25.0000 mg | ORAL_CAPSULE | Freq: Once | ORAL | Status: DC

## 2019-05-24 MED ORDER — SODIUM CHLORIDE 0.9% IV SOLUTION
Freq: Once | INTRAVENOUS | Status: AC
  Administered 2019-05-24: 15:00:00 via INTRAVENOUS

## 2019-05-24 NOTE — Progress Notes (Signed)
PROGRESS NOTE  Jeremy Johnson ATF:[id number] DOB: [birth year] DOA: 05/22/2019 PCP: [name], MD  Brief History   Jeremy Johnson is a 73 y.o. male with medical history significant of small cell lung cancer on active chemotherapy, ESRD on HD (TTS), gout, essential hypertension, hypothyroidism, GERD, HLD who presents from the cancer center with weakness, fatigue.  He is found to have pancytopenia with a white blood cell count of 1.1, hemoglobin 6.0, platelet 12.  He has been experiencing weakness, fatigue and overall malaise over the past week.  He is currently on chemotherapy with carboplatin, etoposide with Neulasta last treatment on 05/14/2019.  Additionally, he has reported passing out during hemodialysis on several past occasions.  No other specific complaints at this time.  Of note, patient recently had left upper extremity AV graft placed on 05/19/2019 by vascular surgery, Dr. Scot Johnson.  Shortly following procedure, patient has complained of progressive left upper extremity edema from his graft site proceeding distally to his hand with associated ecchymosis.  Patient also reports missed HD session on Tuesday, but had full session Wednesday/yesterday and was planning on getting back on his normal schedule today but missed hemodialysis since he was at the cancer center.  ED Course: Temperature 97.8, HR 79, RR 14, BP 118/70, SPO2 99% on room air.  WBC count 1.1, hemoglobin 6.0, platelets 12.  Sodium 134, potassium 3.8, chloride 96, CO2 25, BUN 33, creatinine 7.04, glucose 119.  Anion gap 13.  Albumin 2.7.  INR 1.0.  Calcium 8.3.  Chest x-ray with no acute findings, mild COPD changes noted.  ED provider ordered 1 unit of platelets and 2 units PRBCs for transfusion.  Hospital service was consulted for admission given severe pancytopenia and concern of left upper extremity edema with recent AV graft placement as possible site of acute blood loss anemia. The patient received 2 units of PRBC's as inpatient  and 1 unit of platelets in the ED.  He was admitted to a medical bed. Nephrology, oncology, and vascular surgery were consulted. He received 2 units of PRBC's again on 05/23/2019. He received another 2 units of platelets this morning, and is getting another 2 units of PRBC's this afternoon. Although WBC is improved, he remains neutropenic.  Consultants  . Nephrology . Vascular Surgery . Oncology  Procedures  . HD  Antibiotics   Anti-infectives (From admission, onward)   None      Subjective  The patient is resting comfortably. No new complaints.  Objective   Vitals:  Vitals:   05/24/19 1420 05/24/19 1507  BP: (!) 153/86 (!) 143/77  Pulse: 81 87  Resp: 17 16  Temp: 98.5 F (36.9 C) 97.9 F (36.6 C)  SpO2: 98% 99%    Exam:  Constitutional:  . The patient is awake, alert, and oriented x 3. No acute distress. Respiratory:  . There is no increased work of breathing. . No wheezes, rales, or rhonchi. . No tactile fremitus. Cardiovascular:  . Regular rate and rhythm . No murmurs, ectopy, or gallups . No lateral PMI. No thrills. Abdomen:  . Abdomen is soft, non-tender, non-distended . No hernias, masses, or organomegaly . Normoactive bowel sounds. Musculoskeletal:  . No cyanosis, clubbing, or edema Skin:  . No rashes, lesions, ulcers . palpation of skin: no induration or nodules Neurologic:  . CN 2-12 intact . Sensation all 4 extremities intact Psychiatric:  . Mental status o Mood, affect appropriate o Orientation to person, place, time  . judgment and insight appear intact  I have personally reviewed the following:   Today's Data  . CBC, BMP, Vitals  Scheduled Meds: . sodium chloride   Intravenous Once  . sodium chloride   Intravenous Once  . acetaminophen  650 mg Oral Once  . acetaminophen  650 mg Oral Once  . allopurinol  100 mg Oral Daily  . Chlorhexidine Gluconate Cloth  6 each Topical Q0600  . Chlorhexidine Gluconate Cloth  6 each Topical  Q0600  . diphenhydrAMINE  25 mg Oral Once  . diphenhydrAMINE  25 mg Oral Once  . furosemide  20 mg Intravenous Once  . levothyroxine  175 mcg Oral Q0600  . pantoprazole  40 mg Oral Daily  . polyethylene glycol  17 g Oral Daily  . sevelamer carbonate  1,600 mg Oral TID WC  . simvastatin  20 mg Oral QHS   Continuous Infusions: . sodium chloride    . sodium chloride    . sodium chloride    . sodium chloride      Principal Problem:   Pancytopenia (HCC) Active Problems:   HLD (hyperlipidemia)   Gout, unspecified   Essential hypertension   GERD (gastroesophageal reflux disease)   Hypothyroidism   Small cell lung cancer (HCC)   ESRD (end stage renal disease) on dialysis ([place])   LOS: 2 days    A & P   Pancytopenia  Patient presenting from the cancer center with profound pancytopenia with white blood cell count 1.1, hemoglobin 6.0, platelet count 12.  No signs of active blood loss, although slightly concerned at left upper extremity AV graft site with edema and ecchymosis.  Most recent CBC on 05/12/2019 with WBC count 8.7, hemoglobin 8.7, and platelet 279.  Currently on chemotherapy for his small cell lung cancer with carboplatin, etoposide and receiving Neulasta; last treatment on 05/14/2019.  Etiology likely chemotherapy induced.  Also considerations for immune induced, nutritional deficiency, infectious, DIC, HUS, HL H, TTP. The patient received transfusion of 2 units PRBCs and 1 unit platelets ordered by EDP. This morning the patient's hemoglobin was again 6.1 and platelets were 15 up from 12 on admission. The patient was transfused with 2 more units PRBC's on 05/23/2019. This morning platelets were 8. The patient received 2 more pheresis packs of platelets. This afternoon his hemoglobin againdropped below 7. He will receive 2 more units of PRBC's. Oncology is following the patient. He has completed chemotherapy for his small cell CA. He is on neutropenic precautions. ASA was held. CBC will  be followed. Transfuse for hemoglobin less than 7.0 and platelets less than 10 or if active bleeding.  Small cell lung cancer active on chemotherapy: Patient is followed by medical oncology, Dr. Julien Johnson.  Currently on chemotherapy with carboplatin, etoposide and Neulasta with last treatment on 05/14/2019.  Has completed cycle 4-day 3.  Concern that his pancytopenia is likely chemotherapy induced.  Case was discussed by ED provider with on-call oncology, Dr. Jana Johnson who will notify his primary oncologist regarding his admission. Oncology following. Dr. Julien Johnson tagged the chart for evaluation and further recommendations.  Hypotension: Blood pressures low on admission, but much better now with transfusions and holding antihypertensives. Monitor.  ESRD on HD (TTS): Patient follows in [place].  Reports misses HD session on Tuesday but had a full session Wednesday, 05/21/2019.  Was post to have another session today on 05/22/2019 to get back on his normal schedule but missed since he was at the cancer center. Hemodynamically stable, oxygenating 99% on room air, potassium 3.8.  Nephrology consulted, Dr. Royce Johnson.  Left upper extremity edema and ecchymosis: Resolved. Recent AV graft placed left upper extremity with notable edema from AV graft site to left hand with surrounding ecchymosis. Vascular surgery consulted, Dr. Scot Johnson for evaluation of AV graft site. I received a call from vascular that vascular duplex of left upper extremity was not necessary and it has been discontinued.  Essential hypertension: Patient on amlodipine 5 mg p.o. daily, and Lopressor 25 mg p.o. twice daily at home.  Blood pressure 118/70 in ED, patient states has not taken his blood pressure medicines over the past few days.  He reports has had "passing out spells" during dialysis due to low blood pressures. Continue to hold home antihypertensives. Blood pressures improved with holding antihypertensives and  transfusions.  Hypothyroidism: TSH is elevated at 9.439. He receives 135mcg daily. Will check FT4 and FT3.  GERD: Continue PPI  Hyperlipidemia: Continue statin  DVT prophylaxis: SCDs, chemical DVT prophylaxis contraindicated in the setting of pancytopenia Code Status: Full code Family Communication: Spouse, Jeremy Johnson updated via telephone Disposition Plan: Anticipate discharge home when medically ready.  I have seen and examined this patient myself. I have spent 32 minutes in his evaluation and care.  Zethan Alfieri, DO [facility] Direct contact: see [email or web address]  7PM-7AM contact night coverage as above 05/24/2019, 3:30 PM  LOS: 1 day

## 2019-05-24 NOTE — Progress Notes (Signed)
CRITICAL VALUE ALERT  Critical Value: Hemoglobin 6.9  Date & Time Notied:05/24/2019 1345  Provider Notified: [place]  Orders Received/Actions taken:MD notified will continue to monitor.

## 2019-05-24 NOTE — Progress Notes (Addendum)
Subjective:  No  Co's   Objective Vital signs in last 24 hours: Vitals:   05/24/19 0819 05/24/19 0834 05/24/19 1103 05/24/19 1115  BP: (!) 151/74 (!) 158/76 (!) 145/81 (!) 146/85  Pulse: 85 90 80 82  Resp: 17 18 17 17   Temp: 98 F (36.7 C) 98.1 F (36.7 C) 98.1 F (36.7 C) 99 F (37.2 C)  TempSrc: Oral Oral Oral Oral  SpO2:  99% 98% 98%  Weight:    85.8 kg  Height:       Weight change: 0.002 kg  Physical Exam: General: alert ,adult male in bed in NAD  Heart:RRR; no rub Lungs:clear to auscultation bilaterally ; unlabored Abdomen:Soft/NT/ND normal bowel sounds Extremities:no lower extremity edema; edema LUE Skin:bruising LUE Access: right chest tunneled catheter and LUE AVG with bruit and thrill; bruising and swelling of LU extremity  HD outpatient orders:  [facility] Caraway [facility]=  4h  86.5kg  400/800   3K/2.5 bath  TDC   Problem/Plan  #ESRD - Recent dialysis dependent AKI now progressed to ESRD per outpatient charting - pt has no edema except for focal LUE sp new AVF  -Patient is on hemodialysis TTS  # Syncope on HD: combination anemia, BP meds, possibly need edw raised - hopeful for improvement with transfusion and holding anti-hypertensives - keep even on HD today   #Anemia -Setting ofchemotherapy as well as AKI  -  yest for the second unit of blood to be given on dialysis  - Will defer any ESA given his malignancy  # Pancytopenia - Setting of chemotherapy , seen by ONC  # Small cell lung cancer - s/p cycle 4 of carboplatin and etoposide with neulasta support per hem/onc note. Last cycle completed 7/1 - per hematology/oncology  # HTN/ Hypotension on Admit  - Given hypotension on HD with Admit =would hold amlodipine. Metoprolol is currently on hold as well. Ok to resume low dose beta blocker if agent needed for BP control  - BP's are normal today   # LUE edema: this is local edema due to recent AVF placement (not  vol overload), improving  slowly per pt  Ernest Haber, PA-C [facility] [phone] 05/24/2019,12:45 PM  LOS: 2 days   Labs: Basic Metabolic Panel: Recent Labs  Lab 05/22/19 1418 05/23/19 1202 05/24/19 0353  NA 134* 133* 133*  K 3.8 3.7 3.3*  CL 96* 97* 98  CO2 25 24 27   GLUCOSE 119* 123* 99  BUN 33* 44* 15  CREATININE 7.04* 8.83* 4.89*  CALCIUM 8.3* 7.9* 7.7*   Liver Function Tests: Recent Labs  Lab 05/22/19 1418  AST 13*  ALT 11  ALKPHOS 92  BILITOT 0.8  PROT 6.0*  ALBUMIN 2.7*   No results for input(s): LIPASE, AMYLASE in the last 168 hours. No results for input(s): AMMONIA in the last 168 hours. CBC: Recent Labs  Lab 05/22/19 1232  05/22/19 1418 05/23/19 0746 05/23/19 0849 05/23/19 1843 05/24/19 0353  WBC 1.3*   < > 1.1* 1.2*  1.2*  DUP SEE [id number]  --  1.9*  NEUTROABS 0.2*  --  0.1* 0.3* PENDING  --  0.9*  HGB 5.8*  --  6.0* 6.1*  6.1*  DUP SEE [id number] 7.7* 7.2*  HCT 17.6*  --  18.0* 17.3*  17.3*  DUP SEE [id number] 21.6* 20.7*  MCV 91.7  --  92.3 87.8  87.8  DUP SEE [id number]  --  87.3  PLT 12*   < > 12* 13*  15*  DUP SEE [id number]  --  8*   < > = values in this interval not displayed.   Cardiac Enzymes: No results for input(s): CKTOTAL, CKMB, CKMBINDEX, TROPONINI in the last 168 hours. CBG: No results for input(s): GLUCAP in the last 168 hours.  Studies/Results: Dg Chest Portable 1 View  Result Date: 05/22/2019 CLINICAL DATA:  Shortness of breath. EXAM: PORTABLE CHEST 1 VIEW COMPARISON:  04/14/2019. FINDINGS: Normal sized heart. Tortuous aorta. Clear lungs with stable biapical pleural and parenchymal scarring. The lungs are mildly hyperexpanded. Interval right jugular catheter with its tip at the superior cavoatrial junction. No pneumothorax. Thoracic spine degenerative changes. IMPRESSION: No acute abnormality. Mild changes of COPD. Electronically Signed   By: Claudie Revering M.D.   On: 05/22/2019 15:49   Medications: . sodium chloride    . sodium chloride      . sodium chloride   Intravenous Once  . sodium chloride   Intravenous Once  . acetaminophen  650 mg Oral Once  . allopurinol  100 mg Oral Daily  . Chlorhexidine Gluconate Cloth  6 each Topical Q0600  . Chlorhexidine Gluconate Cloth  6 each Topical Q0600  . diphenhydrAMINE  25 mg Oral Once  . levothyroxine  175 mcg Oral Q0600  . pantoprazole  40 mg Oral Daily  . polyethylene glycol  17 g Oral Daily  . sevelamer carbonate  1,600 mg Oral TID WC  . simvastatin  20 mg Oral QHS

## 2019-05-24 NOTE — Progress Notes (Signed)
Critical platelets 8. Blount, NP notified.

## 2019-05-25 LAB — TYPE AND SCREEN
ABO/RH(D): O POS
Antibody Screen: NEGATIVE
Unit division: 0
Unit division: 0
Unit division: 0
Unit division: 0

## 2019-05-25 LAB — BASIC METABOLIC PANEL
Anion gap: 8 (ref 5–15)
Anion gap: 9 (ref 5–15)
BUN: 15 mg/dL (ref 8–23)
BUN: 10 mg/dL (ref 8–23)
CO2: 27 mmol/L (ref 22–32)
CO2: 25 mmol/L (ref 22–32)
Calcium: 7.7 mg/dL — ABNORMAL LOW (ref 8.9–10.3)
Calcium: 7.6 mg/dL — ABNORMAL LOW (ref 8.9–10.3)
Chloride: 98 mmol/L (ref 98–111)
Chloride: 100 mmol/L (ref 98–111)
Creatinine, Ser: 4.89 mg/dL — ABNORMAL HIGH (ref 0.61–1.24)
Creatinine, Ser: 4.22 mg/dL — ABNORMAL HIGH (ref 0.61–1.24)
GFR calc Af Amer: 13 mL/min — ABNORMAL LOW (ref >60)
GFR calc Af Amer: 15 mL/min — ABNORMAL LOW (ref >60)
GFR calc non Af Amer: 11 mL/min — ABNORMAL LOW (ref >60)
GFR calc non Af Amer: 13 mL/min — ABNORMAL LOW (ref >60)
Glucose, Bld: 99 mg/dL (ref 70–99)
Glucose, Bld: 91 mg/dL (ref 70–99)
Potassium: 3.3 mmol/L — ABNORMAL LOW (ref 3.5–5.1)
Potassium: 3.6 mmol/L (ref 3.5–5.1)
Sodium: 133 mmol/L — ABNORMAL LOW (ref 135–145)
Sodium: 134 mmol/L — ABNORMAL LOW (ref 135–145)

## 2019-05-25 LAB — CBC WITH DIFFERENTIAL/PLATELET
Abs Immature Granulocytes: 0.08 10*3/uL — ABNORMAL HIGH (ref 0.00–0.07)
Basophils Absolute: 0 10*3/uL (ref 0.0–0.1)
Basophils Relative: 1 %
Eosinophils Absolute: 0.1 10*3/uL (ref 0.0–0.5)
Eosinophils Relative: 1 %
HCT: 25.8 % — ABNORMAL LOW (ref 39.0–52.0)
Hemoglobin: 9.1 g/dL — ABNORMAL LOW (ref 13.0–17.0)
Immature Granulocytes: 2 %
Lymphocytes Relative: 32 %
Lymphs Abs: 1.1 10*3/uL (ref 0.7–4.0)
MCH: 30.6 pg (ref 26.0–34.0)
MCHC: 35.3 g/dL (ref 30.0–36.0)
MCV: 86.9 fL (ref 80.0–100.0)
Monocytes Absolute: 0.5 10*3/uL (ref 0.1–1.0)
Monocytes Relative: 13 %
Neutro Abs: 1.8 10*3/uL (ref 1.7–7.7)
Neutrophils Relative %: 51 %
Platelets: 15 10*3/uL — CL (ref 150–400)
RBC: 2.97 MIL/uL — ABNORMAL LOW (ref 4.22–5.81)
RDW: 14.5 % (ref 11.5–15.5)
WBC: 3.5 10*3/uL — ABNORMAL LOW (ref 4.0–10.5)
nRBC: 0 % (ref 0.0–0.2)

## 2019-05-25 LAB — HEPATITIS B SURFACE ANTIGEN: Hepatitis B Surface Ag: NEGATIVE

## 2019-05-25 LAB — BPAM RBC
Blood Product Expiration Date: 202008072359
Blood Product Expiration Date: 202008102359
Blood Product Expiration Date: 202008112359
Blood Product Expiration Date: 202008122359
ISSUE DATE / TIME: 202007091637
ISSUE DATE / TIME: 202007101430
ISSUE DATE / TIME: 202007111512
ISSUE DATE / TIME: 202007111838
Unit Type and Rh: 5100
Unit Type and Rh: 5100
Unit Type and Rh: 5100
Unit Type and Rh: 5100

## 2019-05-25 LAB — PREPARE PLATELET PHERESIS
Unit division: 0
Unit division: 0

## 2019-05-25 LAB — BPAM PLATELET PHERESIS
Blood Product Expiration Date: 202007122359
Blood Product Expiration Date: 202007122359
ISSUE DATE / TIME: 202007110620
ISSUE DATE / TIME: 202007110812
Unit Type and Rh: 6200
Unit Type and Rh: 6200

## 2019-05-25 LAB — HEPATITIS B SURFACE ANTIBODY,QUALITATIVE: Hep B S Ab: REACTIVE

## 2019-05-25 MED ORDER — POLYETHYLENE GLYCOL 3350 17 G PO PACK: 17.0000 g | PACK | Freq: Every day | ORAL | 0 refills | Status: DC

## 2019-05-25 MED ORDER — ACETAMINOPHEN 325 MG PO TABS: 650.0000 mg | ORAL_TABLET | Freq: Four times a day (QID) | ORAL | 0 refills | Status: DC | PRN

## 2019-05-25 NOTE — Progress Notes (Addendum)
Subjective:  Tolerated hd yesterday  No bp drops and not syncopal ,Up in bedside chair  Objective Vital signs in last 24 hours: Vitals:   05/24/19 2126 05/24/19 2200 05/25/19 0449 05/25/19 0556  BP: (!) 155/89 (!) 144/83 (!) 147/81   Pulse: 83 78 79   Resp: 18 16 18    Temp: 98.5 F (36.9 C) 99 F (37.2 C) 98.1 F (36.7 C)   TempSrc: Oral Oral Oral   SpO2: 100% 97% 96%   Weight:    86.9 kg  Height:       Weight change: -2.2 kg  Physical Exam: General: alert ,adult male  NAD  Heart:RRR; no rub Lungs:clear to auscultation bilaterally ; unlabored Abdomen:Soft/NT/ND normal bowel sounds Extremities:no lower extremity edema; edema LUE @ new avf  Skin:bruising LUE improving  Access: right chest tunneled catheter and LUE AVG with bruit and thrill; bruising and swelling ofLUextremity  HD outpatient orders: [facility] Car[facility]= 4h 86.5kg 400/800 3K/2.5 bath TDC   Problem/Plan  #ESRD - Recent dialysis dependent AKI now progressed to ESRD per outpatient charting - pt has no edema except for focal LUE sp new AVF  -Patient is on hemodialysisTTS  # Syncope on HD: combination anemia, BP meds, possibly need edw raised  To 87.5  -hopeful for improvement with transfusion and holding anti-hypertensives - keep even on HD  yesterday  #Anemia -Setting ofchemotherapy as well as AKI  -  yest for the second unit of blood to be given on dialysis hgb 9.1 - Will defer any ESA given his malignancy  # Pancytopenia plt 15  - Setting of chemotherapy, seen by ONC  # Small cell lung cancer - s/p cycle 4ofcarboplatin and etoposide with neulasta support per hem/onc note. Last cycle completed 7/1 - per hematology/oncology  # HTN/ Hypotension on Admit  - Given hypotension on HD with Admit =would hold amlodipine. Metoprolol is currently on hold as well. Ok to resume low dose beta blocker if agent needed for BP control - BP's are normal today  # LUE edema: this  is local edema due to recent AVF placement (not  vol overload), improving slowly per pt  Ernest Haber, PA-C [place] [phone] 05/25/2019,2:10 PM  LOS: 3 days   Pt seen, examined and agree w A/P as above. Counts are up, for dc today.  Kelly Splinter  MD 05/25/2019, 4:07 PM   Labs: Basic Metabolic Panel: Recent Labs  Lab 05/23/19 1202 05/24/19 0353 05/25/19 0325  NA 133* 133* 134*  K 3.7 3.3* 3.6  CL 97* 98 100  CO2 24 27 25   GLUCOSE 123* 99 91  BUN 44* 15 10  CREATININE 8.83* 4.89* 4.22*  CALCIUM 7.9* 7.7* 7.6*   Liver Function Tests: Recent Labs  Lab 05/22/19 1418  AST 13*  ALT 11  ALKPHOS 92  BILITOT 0.8  PROT 6.0*  ALBUMIN 2.7*   No results for input(s): LIPASE, AMYLASE in the last 168 hours. No results for input(s): AMMONIA in the last 168 hours. CBC: Recent Labs  Lab 05/23/19 0746 05/23/19 0849  05/24/19 0353 05/24/19 1244 05/25/19 0325  WBC 1.2*  1.2*  DUP SEE [id number]  --  1.9* 2.4* 3.5*  NEUTROABS 0.3* PENDING  --  0.9* 1.2* 1.8  HGB 6.1*  6.1*  DUP SEE [id number]   < > 7.2* 6.9* 9.1*  HCT 17.3*  17.3*  DUP SEE [id number]   < > 20.7* 20.3* 25.8*  MCV 87.8  87.8  DUP SEE [id number]  --  87.3 89.0 86.9  PLT 15*  13*  DUP SEE [id number]  --  8* 18* 15*   < > = values in this interval not displayed.   Cardiac Enzymes: No results for input(s): CKTOTAL, CKMB, CKMBINDEX, TROPONINI in the last 168 hours. CBG: No results for input(s): GLUCAP in the last 168 hours.  Studies/Results: No results found. Medications: . sodium chloride    . sodium chloride    . sodium chloride    . sodium chloride     . sodium chloride   Intravenous Once  . sodium chloride   Intravenous Once  . acetaminophen  650 mg Oral Once  . acetaminophen  650 mg Oral Once  . allopurinol  100 mg Oral Daily  . Chlorhexidine Gluconate Cloth  6 each Topical Q0600  . Chlorhexidine Gluconate Cloth  6 each Topical Q0600  . diphenhydrAMINE  25 mg Oral Once  . diphenhydrAMINE   25 mg Oral Once  . levothyroxine  175 mcg Oral Q0600  . pantoprazole  40 mg Oral Daily  . polyethylene glycol  17 g Oral Daily  . sevelamer carbonate  1,600 mg Oral TID WC  . simvastatin  20 mg Oral QHS

## 2019-05-25 NOTE — Progress Notes (Signed)
Jeremy Johnson to be D/C'd home per MD order. Discussed with the patient and all questions fully answered.   VVS, Skin clean, dry and intact without evidence of skin break down, no evidence of skin tears noted.  IV catheter discontinued intact. Site without signs and symptoms of complications. Dressing and pressure applied.  An After Visit Summary was printed and given to the patient.  Patient escorted via [place], and D/C home via private auto.  [name]  05/25/2019 2:28 PM

## 2019-05-25 NOTE — Progress Notes (Signed)
Lab Called with Critical Value.  RBC FOLATE Sent out to Lab Core on 05/23/2019, HCT 17.5.  Its an old value but I reported this to RN taking care of him tonight.  I don't believe this needs to be reported to MD because pt has been treated with 2 units of blood since this lab was drawn.

## 2019-05-26 ENCOUNTER — Inpatient Hospital Stay: Payer: Medicare Other

## 2019-05-26 ENCOUNTER — Telehealth: Payer: Self-pay | Admitting: Hematology

## 2019-05-26 ENCOUNTER — Other Ambulatory Visit: Payer: Self-pay | Admitting: Medical Oncology

## 2019-05-26 ENCOUNTER — Other Ambulatory Visit: Payer: Self-pay

## 2019-05-26 ENCOUNTER — Telehealth: Payer: Self-pay | Admitting: Medical Oncology

## 2019-05-26 ENCOUNTER — Telehealth: Payer: Self-pay | Admitting: Internal Medicine

## 2019-05-26 DIAGNOSIS — Z923 Personal history of irradiation: Secondary | ICD-10-CM | POA: Diagnosis not present

## 2019-05-26 DIAGNOSIS — K219 Gastro-esophageal reflux disease without esophagitis: Secondary | ICD-10-CM | POA: Diagnosis not present

## 2019-05-26 DIAGNOSIS — Z5189 Encounter for other specified aftercare: Secondary | ICD-10-CM | POA: Diagnosis not present

## 2019-05-26 DIAGNOSIS — Z9221 Personal history of antineoplastic chemotherapy: Secondary | ICD-10-CM | POA: Diagnosis not present

## 2019-05-26 DIAGNOSIS — R5383 Other fatigue: Secondary | ICD-10-CM | POA: Diagnosis not present

## 2019-05-26 DIAGNOSIS — J449 Chronic obstructive pulmonary disease, unspecified: Secondary | ICD-10-CM | POA: Diagnosis not present

## 2019-05-26 DIAGNOSIS — C3481 Malignant neoplasm of overlapping sites of right bronchus and lung: Secondary | ICD-10-CM | POA: Diagnosis not present

## 2019-05-26 DIAGNOSIS — Z955 Presence of coronary angioplasty implant and graft: Secondary | ICD-10-CM | POA: Diagnosis not present

## 2019-05-26 DIAGNOSIS — D696 Thrombocytopenia, unspecified: Secondary | ICD-10-CM | POA: Diagnosis not present

## 2019-05-26 DIAGNOSIS — D631 Anemia in chronic kidney disease: Secondary | ICD-10-CM | POA: Diagnosis not present

## 2019-05-26 DIAGNOSIS — Z87891 Personal history of nicotine dependence: Secondary | ICD-10-CM | POA: Diagnosis not present

## 2019-05-26 DIAGNOSIS — E039 Hypothyroidism, unspecified: Secondary | ICD-10-CM | POA: Diagnosis not present

## 2019-05-26 DIAGNOSIS — Z5111 Encounter for antineoplastic chemotherapy: Secondary | ICD-10-CM | POA: Diagnosis not present

## 2019-05-26 DIAGNOSIS — N186 End stage renal disease: Secondary | ICD-10-CM | POA: Diagnosis not present

## 2019-05-26 DIAGNOSIS — E785 Hyperlipidemia, unspecified: Secondary | ICD-10-CM | POA: Diagnosis not present

## 2019-05-26 DIAGNOSIS — M109 Gout, unspecified: Secondary | ICD-10-CM | POA: Diagnosis not present

## 2019-05-26 DIAGNOSIS — Z992 Dependence on renal dialysis: Secondary | ICD-10-CM | POA: Diagnosis not present

## 2019-05-26 DIAGNOSIS — I120 Hypertensive chronic kidney disease with stage 5 chronic kidney disease or end stage renal disease: Secondary | ICD-10-CM | POA: Diagnosis not present

## 2019-05-26 DIAGNOSIS — Z79899 Other long term (current) drug therapy: Secondary | ICD-10-CM | POA: Diagnosis not present

## 2019-05-26 DIAGNOSIS — C3492 Malignant neoplasm of unspecified part of left bronchus or lung: Secondary | ICD-10-CM

## 2019-05-26 DIAGNOSIS — D61818 Other pancytopenia: Secondary | ICD-10-CM | POA: Diagnosis not present

## 2019-05-26 DIAGNOSIS — I252 Old myocardial infarction: Secondary | ICD-10-CM | POA: Diagnosis not present

## 2019-05-26 LAB — CBC WITH DIFFERENTIAL (CANCER CENTER ONLY)
Abs Immature Granulocytes: 0.28 10*3/uL — ABNORMAL HIGH (ref 0.00–0.07)
Basophils Absolute: 0 10*3/uL (ref 0.0–0.1)
Basophils Relative: 0 %
Eosinophils Absolute: 0.1 10*3/uL (ref 0.0–0.5)
Eosinophils Relative: 1 %
HCT: 30.1 % — ABNORMAL LOW (ref 39.0–52.0)
Hemoglobin: 10.1 g/dL — ABNORMAL LOW (ref 13.0–17.0)
Immature Granulocytes: 4 %
Lymphocytes Relative: 20 %
Lymphs Abs: 1.4 10*3/uL (ref 0.7–4.0)
MCH: 30.1 pg (ref 26.0–34.0)
MCHC: 33.6 g/dL (ref 30.0–36.0)
MCV: 89.9 fL (ref 80.0–100.0)
Monocytes Absolute: 1 10*3/uL (ref 0.1–1.0)
Monocytes Relative: 14 %
Neutro Abs: 4.4 10*3/uL (ref 1.7–7.7)
Neutrophils Relative %: 61 %
Platelet Count: 12 10*3/uL — ABNORMAL LOW (ref 150–400)
RBC: 3.35 MIL/uL — ABNORMAL LOW (ref 4.22–5.81)
RDW: 14.6 % (ref 11.5–15.5)
WBC Count: 7.1 10*3/uL (ref 4.0–10.5)
nRBC: 0 % (ref 0.0–0.2)

## 2019-05-26 LAB — CMP (CANCER CENTER ONLY)
ALT: 7 U/L (ref 0–44)
AST: 14 U/L — ABNORMAL LOW (ref 15–41)
Albumin: 2.6 g/dL — ABNORMAL LOW (ref 3.5–5.0)
Alkaline Phosphatase: 116 U/L (ref 38–126)
Anion gap: 9 (ref 5–15)
BUN: 19 mg/dL (ref 8–23)
CO2: 29 mmol/L (ref 22–32)
Calcium: 7.8 mg/dL — ABNORMAL LOW (ref 8.9–10.3)
Chloride: 98 mmol/L (ref 98–111)
Creatinine: 6.97 mg/dL (ref 0.61–1.24)
GFR, Est AFR Am: 8 mL/min — ABNORMAL LOW (ref >60)
GFR, Estimated: 7 mL/min — ABNORMAL LOW (ref >60)
Glucose, Bld: 112 mg/dL — ABNORMAL HIGH (ref 70–99)
Potassium: 4.1 mmol/L (ref 3.5–5.1)
Sodium: 136 mmol/L (ref 135–145)
Total Bilirubin: 0.3 mg/dL (ref 0.3–1.2)
Total Protein: 6.3 g/dL — ABNORMAL LOW (ref 6.5–8.1)

## 2019-05-26 LAB — FOLATE RBC
Folate, Hemolysate: 244 ng/mL
Folate, RBC: 1394 ng/mL (ref >498)
Hematocrit: 17.5 % — CL (ref 37.5–51.0)

## 2019-05-26 NOTE — Discharge Summary (Signed)
Physician Discharge Summary  Jeremy Johnson TIR:[id number] DOB: 1946/03/17 DOA: 05/22/2019  PCP: [name], MD  Admit date: 05/22/2019 Discharge date: 05/26/2019  Recommendations for Outpatient Follow-up:  1. Patient to follow up with oncology as directed. 2. Patient to have CBC drawn on 05/28/2019 and reported to oncology and PCP. 3. Patient is to follow up with PCP in 7-10 days.  Discharge Diagnoses: Principal diagnosis is #1 1. Pancytopenia due to chemotherapy 2. ESRD on HD 3. Recent AV graft 4. Small Cell Lung CA. 5. Ecchymosis 6. Weakness and fatigue  Discharge Condition: Fair Disposition: Home  Diet recommendation: Heart healthy  Filed Weights   05/24/19 1115 05/24/19 1420 05/25/19 0556  Weight: 85.8 kg 85.8 kg 86.9 kg    History of present illness:    Hospital Course:  Jeremy Johnson a 73 y.o.malewith medical history significant ofsmall cell lung cancer on active chemotherapy, ESRD on HD(TTS),gout, essential hypertension, hypothyroidism, GERD, HLD who presents from the cancer center with weakness, fatigue. He is found to have pancytopenia with a white blood cell count of 1.1, hemoglobin 6.0, platelet 12. He has been experiencing weakness, fatigue and overall malaise over the past week. He is currently on chemotherapy with carboplatin, etoposide with Neulasta last treatment on 05/14/2019. Additionally, he has reported passing out during hemodialysis on several past occasions. No other specific complaints at this time.  Of note, patient recently had left upper extremity AV graft placed on 05/19/2019 by vascular surgery, Dr.Dickson.Shortly following procedure, patient has complained of progressive left upper extremity edema from his graft site proceeding distally to his hand with associated ecchymosis.  Patient also reports missed HD session on Tuesday, but had full session Wednesday/yesterday and was planning on getting back on his normal schedule today but  missed hemodialysis since he was at the cancer center.  ED Course:Temperature 97.8, HR 79, RR 14, BP 118/70, SPO2 99% on room air. WBC count 1.1, hemoglobin 6.0, platelets 12. Sodium 134, potassium 3.8, chloride 96, CO2 25, BUN 33, creatinine 7.04, glucose 119. Anion gap 13. Albumin 2.7. INR 1.0. Calcium 8.3. Chest x-ray with no acute findings, mild COPD changes noted. ED provider ordered 1 unit of platelets and 2 units PRBCs for transfusion. Hospital service was consulted for admission given severe pancytopenia and concern of left upper extremity edema with recent AV graft placement as possible site of acute blood loss anemia. The patient received 2 units of PRBC's as inpatient and 1 unit of platelets in the ED.  He was admitted to a medical bed. Nephrology, oncology, and vascular surgery were consulted. He received 2 units of PRBC's again on 05/23/2019. He received another 2 units of platelets this morning, and is getting another 2 units of PRBC's this afternoon.   On the day of discharge, the patient's platelets remained at 15, his WBC was up to 3.5. ANC was 1.78. Hemoglobin was 9.1. He was feeling well and wanted to be discharged.  Today's assessment: S: The patient is sitting up at bedside. No new complaints. O: Vitals:  Vitals:   05/24/19 2200 05/25/19 0449  BP: (!) 144/83 (!) 147/81  Pulse: 78 79  Resp: 16 18  Temp: 99 F (37.2 C) 98.1 F (36.7 C)  SpO2: 97% 96%    Constitutional:   The patient is awake, alert, and oriented x 3. No acute distress. Respiratory:   There is no increased work of breathing.  No wheezes, rales, or rhonchi.  No tactile fremitus. Cardiovascular:   Regular rate and rhythm  No murmurs, ectopy, or gallups  No lateral PMI. No thrills. Abdomen:   Abdomen is soft, non-tender, non-distended  No hernias, masses, or organomegaly  Normoactive bowel sounds. Musculoskeletal:   No cyanosis, clubbing, or edema Skin:   No rashes,  lesions, ulcers  palpation of skin: no induration or nodules Neurologic:   CN 2-12 intact  Sensation all 4 extremities intact Psychiatric:   Mental status ? Mood, affect appropriate ? Orientation to person, place, time   judgment and insight appear intact   Discharge Instructions  Discharge Instructions    Activity as tolerated - No restrictions   Complete by: As directed    Call MD for:  persistant dizziness or light-headedness   Complete by: As directed    Call MD for:  persistant nausea and vomiting   Complete by: As directed    Call MD for:  severe uncontrolled pain   Complete by: As directed    Diet - low sodium heart healthy   Complete by: As directed    Discharge instructions   Complete by: As directed    Follow up with PCP in 7-10 days. Keep appointments with oncology. CBC to be drawn and reported to oncology on 05/28/2019.   Increase activity slowly   Complete by: As directed      Allergies as of 05/25/2019      Reactions   Penicillins Rash, Other (See Comments)   Has patient had a PCN reaction causing immediate rash, facial/tongue/throat swelling, SOB or lightheadedness with hypotension: No Has patient had a PCN reaction causing severe rash involving mucus membranes or skin necrosis: No Has patient had a PCN reaction that required hospitalization: No Has patient had a PCN reaction occurring within the last 10 years: No If all of the above answers are "NO", then may proceed with Cephalosporin use.      Medication List    STOP taking these medications   amLODipine 5 MG tablet Commonly known as: NORVASC   aspirin EC 81 MG tablet     TAKE these medications   acetaminophen 325 MG tablet Commonly known as: TYLENOL Take 2 tablets (650 mg total) by mouth every 6 (six) hours as needed for mild pain (or Fever >/= 101).   allopurinol 100 MG tablet Commonly known as: ZYLOPRIM Take 1 tablet (100 mg total) by mouth daily.   Darbepoetin Alfa 100 MCG/0.5ML  Sosy injection Commonly known as: ARANESP Inject 0.5 mLs (100 mcg total) into the vein every Thursday with hemodialysis.   levothyroxine 175 MCG tablet Commonly known as: SYNTHROID Take 175 mcg by mouth daily before breakfast.   metoprolol tartrate 50 MG tablet Commonly known as: LOPRESSOR Take 25 mg by mouth 2 (two) times daily.   omeprazole 20 MG capsule Commonly known as: PRILOSEC Take 20 mg by mouth daily.   polyethylene glycol 17 g packet Commonly known as: MIRALAX / GLYCOLAX Take 17 g by mouth daily.   sevelamer carbonate 800 MG tablet Commonly known as: RENVELA Take 2 tablets (1,600 mg total) by mouth 3 (three) times daily with meals.   simvastatin 20 MG tablet Commonly known as: ZOCOR Take 20 mg by mouth at bedtime.   sucralfate 1 g tablet Commonly known as: Carafate Take 1 tablet (1 g total) by mouth 4 (four) times daily -  with meals and at bedtime. 5 min before meals for radiation induced esophagitis What changed:   when to take this  reasons to take this      Allergies  Allergen Reactions  . Penicillins Rash and Other (See Comments)    Has patient had a PCN reaction causing immediate rash, facial/tongue/throat swelling, SOB or lightheadedness with hypotension: No Has patient had a PCN reaction causing severe rash involving mucus membranes or skin necrosis: No Has patient had a PCN reaction that required hospitalization: No Has patient had a PCN reaction occurring within the last 10 years: No If all of the above answers are "NO", then may proceed with Cephalosporin use.     The results of significant diagnostics from this hospitalization (including imaging, microbiology, ancillary and laboratory) are listed below for reference.    Significant Diagnostic Studies: Dg Chest Portable 1 View  Result Date: 05/22/2019 CLINICAL DATA:  Shortness of breath. EXAM: PORTABLE CHEST 1 VIEW COMPARISON:  04/14/2019. FINDINGS: Normal sized heart. Tortuous aorta. Clear  lungs with stable biapical pleural and parenchymal scarring. The lungs are mildly hyperexpanded. Interval right jugular catheter with its tip at the superior cavoatrial junction. No pneumothorax. Thoracic spine degenerative changes. IMPRESSION: No acute abnormality. Mild changes of COPD. Electronically Signed   By: Claudie Revering M.D.   On: 05/22/2019 15:49   Vas US Duplex Dialysis Access (avf, Avg)  Result Date: 05/14/2019 DIALYSIS ACCESS Reason for Exam: No palpable thrill for AVF/AVG. Access Site: Left Upper Extremity. Access Type: Brachial-cephalic AVF 02/22/8785 Performing Technologist: Ronal Fear RVS, RCS  Examination Guidelines: A complete evaluation includes B-mode imaging, spectral Doppler, color Doppler, and power Doppler as needed of all accessible portions of each vessel. Unilateral testing is considered an integral part of a complete examination. Limited examinations for reoccurring indications may be performed as noted.  Findings: +--------------------+----------+-----------------+--------+ AVF                 PSV (cm/s)Flow Vol (mL/min)Comments +--------------------+----------+-----------------+--------+ Native artery inflow    98                              +--------------------+----------+-----------------+--------+ AVF Anastomosis         62                              +--------------------+----------+-----------------+--------+  +------------+----------+-------------+----------+-------------------+ OUTFLOW VEINPSV (cm/s)Diameter (cm)Depth (cm)     Describe       +------------+----------+-------------+----------+-------------------+ Shoulder                  0.21        1.18                       +------------+----------+-------------+----------+-------------------+ Prox UA                   0.23        0.68                       +------------+----------+-------------+----------+-------------------+ Mid UA                    0.37        0.52                        +------------+----------+-------------+----------+-------------------+ Dist UA                   0.43        0.31   partially-occlusive +------------+----------+-------------+----------+-------------------+ [facility] Fossa  0.22                    occluded       +------------+----------+-------------+----------+-------------------+  +-----------------+-------------+----------+---------+----------+--------------+ basilic vein:    Diameter (cm)Depth (cm)BranchingPSV (cm/s) Flow Volume                                                                 (ml/min)    +-----------------+-------------+----------+---------+----------+--------------+ proximal upper       0.62                                                 arm                                                                       +-----------------+-------------+----------+---------+----------+--------------+ mid upper arm        0.55                                                 +-----------------+-------------+----------+---------+----------+--------------+ distal upper arm     0.52                                                 +-----------------+-------------+----------+---------+----------+--------------+ antecubital fossa    0.33                 0.22                            +-----------------+-------------+----------+---------+----------+--------------+  Summary: Arteriovenous fistula-Thrombus noted in the antecubital fossa region with no color or Doppler flow.  Widely patent left basilic vein.  *See table(s) above for measurements and observations.  Diagnosing physician: [name] MD Electronically signed by [name] MD on 05/14/2019 at 4:59:25 PM.    --------------------------------------------------------------------------------   Final     Microbiology: Recent Results (from the past 240 hour(s))  Blood culture (routine x 2)     Status: None  (Preliminary result)   Collection Time: 05/22/19  3:59 PM   Specimen: BLOOD LEFT ARM  Result Value Ref Range Status   Specimen Description BLOOD LEFT ARM  Final   Special Requests   Final    BOTTLES DRAWN AEROBIC AND ANAEROBIC Blood Culture results may not be optimal due to an inadequate volume of blood received in culture bottles   Culture   Final    NO GROWTH 3 DAYS Performed at [facility], [place] [address], [place], [place] [address]    Report Status PENDING  Incomplete  Blood culture (routine x 2)     Status: None (Preliminary result)  Collection Time: 05/22/19  3:59 PM   Specimen: BLOOD LEFT ARM  Result Value Ref Range Status   Specimen Description BLOOD LEFT ARM  Final   Special Requests   Final    BOTTLES DRAWN AEROBIC AND ANAEROBIC Blood Culture adequate volume   Culture   Final    NO GROWTH 3 DAYS Performed at [facility], [address] [place], [place] [address]    Report Status PENDING  Incomplete  SARS Coronavirus 2 (CEPHEID - Performed in [place] hospital lab), Hosp Order     Status: None   Collection Time: 05/22/19  5:58 PM   Specimen: Nasopharyngeal Swab  Result Value Ref Range Status   SARS Coronavirus 2 NEGATIVE NEGATIVE Final    Comment: (NOTE) If result is NEGATIVE SARS-CoV-2 target nucleic acids are NOT DETECTED. The SARS-CoV-2 RNA is generally detectable in upper and lower  respiratory specimens during the acute phase of infection. The lowest  concentration of SARS-CoV-2 viral copies this assay can detect is 250  copies / mL. A negative result does not preclude SARS-CoV-2 infection  and should not be used as the sole basis for treatment or other  patient management decisions.  A negative result may occur with  improper specimen collection / handling, submission of specimen other  than nasopharyngeal swab, presence of viral mutation(s) within the  areas targeted by this assay, and inadequate number of viral copies  (<250 copies / mL). A  negative result must be combined with clinical  observations, patient history, and epidemiological information. If result is POSITIVE SARS-CoV-2 target nucleic acids are DETECTED. The SARS-CoV-2 RNA is generally detectable in upper and lower  respiratory specimens dur ing the acute phase of infection.  Positive  results are indicative of active infection with SARS-CoV-2.  Clinical  correlation with patient history and other diagnostic information is  necessary to determine patient infection status.  Positive results do  not rule out bacterial infection or co-infection with other viruses. If result is PRESUMPTIVE POSTIVE SARS-CoV-2 nucleic acids MAY BE PRESENT.   A presumptive positive result was obtained on the submitted specimen  and confirmed on repeat testing.  While 2019 novel coronavirus  (SARS-CoV-2) nucleic acids may be present in the submitted sample  additional confirmatory testing may be necessary for epidemiological  and / or clinical management purposes  to differentiate between  SARS-CoV-2 and other Sarbecovirus currently known to infect humans.  If clinically indicated additional testing with an alternate test  methodology [phone]) is advised. The SARS-CoV-2 RNA is generally  detectable in upper and lower respiratory sp ecimens during the acute  phase of infection. The expected result is Negative. Fact Sheet for Patients:  StrictlyIdeas.no Fact Sheet for Healthcare Providers: BankingDealers.co.za This test is not yet approved or cleared by the [place] FDA and has been authorized for detection and/or diagnosis of SARS-CoV-2 by FDA under an Emergency Use Authorization (EUA).  This EUA will remain in effect (meaning this test can be used) for the duration of the COVID-19 declaration under Section 564(b)(1) of the Act, 21 U.S.C. section 360bbb-3(b)(1), unless the authorization is terminated or revoked sooner. Performed  at [facility], [place] [address] [place], [place] [address]      Labs: Basic Metabolic Panel: Recent Labs  Lab 05/22/19 1418 05/23/19 0746 05/23/19 1202 05/24/19 0353 05/25/19 0325  NA 134*  --  133* 133* 134*  K 3.8  --  3.7 3.3* 3.6  CL 96*  --  97*  98 100  CO2 25  --  24 27 25   GLUCOSE 119*  --  123* 99 91  BUN 33*  --  44* 15 10  CREATININE 7.04*  --  8.83* 4.89* 4.22*  CALCIUM 8.3*  --  7.9* 7.7* 7.6*  MG  --  1.9  --   --   --    Liver Function Tests: Recent Labs  Lab 05/22/19 1418  AST 13*  ALT 11  ALKPHOS 92  BILITOT 0.8  PROT 6.0*  ALBUMIN 2.7*   No results for input(s): LIPASE, AMYLASE in the last 168 hours. No results for input(s): AMMONIA in the last 168 hours. CBC: Recent Labs  Lab 05/23/19 0746 05/23/19 0849 05/23/19 1843 05/24/19 0353 05/24/19 1244 05/25/19 0325  WBC 1.2*  1.2*  DUP SEE [id number]  --  1.9* 2.4* 3.5*  NEUTROABS 0.3* PENDING  --  0.9* 1.2* 1.8  HGB 6.1*  6.1*  DUP SEE [id number] 7.7* 7.2* 6.9* 9.1*  HCT 17.3*  17.3*  DUP SEE [id number] 21.6* 20.7* 20.3* 25.8*  MCV 87.8  87.8  DUP SEE [id number]  --  87.3 89.0 86.9  PLT 15*  13*  DUP SEE [id number]  --  8* 18* 15*   Cardiac Enzymes: No results for input(s): CKTOTAL, CKMB, CKMBINDEX, TROPONINI in the last 168 hours. BNP: BNP (last 3 results) No results for input(s): BNP in the last 8760 hours.  ProBNP (last 3 results) No results for input(s): PROBNP in the last 8760 hours.  CBG: No results for input(s): GLUCAP in the last 168 hours.  Principal Problem:   Pancytopenia ([place]) Active Problems:   HLD (hyperlipidemia)   Gout, unspecified   Essential hypertension   GERD (gastroesophageal reflux disease)   Hypothyroidism   Small cell lung cancer ([place])   ESRD (end stage renal disease) on dialysis [facility])   Time coordinating discharge: 38 minutes.  Signed:        Brittiny Levitz, DO [facility]  05/26/2019, 9:19 AM

## 2019-05-26 NOTE — Telephone Encounter (Signed)
Erroneous encounter

## 2019-05-26 NOTE — Telephone Encounter (Signed)
Spoke with patient re 7/15 infusion. Per desk nurse date ok due to patient has dialysis tues/thurs/sat.

## 2019-05-26 NOTE — Telephone Encounter (Signed)
Low platelets-  Per Dr Julien Nordmann I LVM for pt expect a call to schedule a platelet transfusion this week.

## 2019-05-27 ENCOUNTER — Ambulatory Visit: Payer: Medicare Other

## 2019-05-27 DIAGNOSIS — D689 Coagulation defect, unspecified: Secondary | ICD-10-CM | POA: Diagnosis not present

## 2019-05-27 DIAGNOSIS — N2581 Secondary hyperparathyroidism of renal origin: Secondary | ICD-10-CM | POA: Diagnosis not present

## 2019-05-27 DIAGNOSIS — Z23 Encounter for immunization: Secondary | ICD-10-CM | POA: Diagnosis not present

## 2019-05-27 DIAGNOSIS — E039 Hypothyroidism, unspecified: Secondary | ICD-10-CM | POA: Diagnosis not present

## 2019-05-27 DIAGNOSIS — N186 End stage renal disease: Secondary | ICD-10-CM | POA: Diagnosis not present

## 2019-05-27 DIAGNOSIS — E876 Hypokalemia: Secondary | ICD-10-CM | POA: Diagnosis not present

## 2019-05-27 DIAGNOSIS — D631 Anemia in chronic kidney disease: Secondary | ICD-10-CM | POA: Diagnosis not present

## 2019-05-27 LAB — CULTURE, BLOOD (ROUTINE X 2)
Culture: NO GROWTH
Culture: NO GROWTH
Special Requests: ADEQUATE

## 2019-05-28 ENCOUNTER — Other Ambulatory Visit: Payer: Self-pay

## 2019-05-28 ENCOUNTER — Inpatient Hospital Stay: Payer: Medicare Other

## 2019-05-28 DIAGNOSIS — Z955 Presence of coronary angioplasty implant and graft: Secondary | ICD-10-CM | POA: Diagnosis not present

## 2019-05-28 DIAGNOSIS — Z79899 Other long term (current) drug therapy: Secondary | ICD-10-CM | POA: Diagnosis not present

## 2019-05-28 DIAGNOSIS — Z5111 Encounter for antineoplastic chemotherapy: Secondary | ICD-10-CM | POA: Diagnosis not present

## 2019-05-28 DIAGNOSIS — E039 Hypothyroidism, unspecified: Secondary | ICD-10-CM | POA: Diagnosis not present

## 2019-05-28 DIAGNOSIS — J449 Chronic obstructive pulmonary disease, unspecified: Secondary | ICD-10-CM | POA: Diagnosis not present

## 2019-05-28 DIAGNOSIS — M109 Gout, unspecified: Secondary | ICD-10-CM | POA: Diagnosis not present

## 2019-05-28 DIAGNOSIS — Z87891 Personal history of nicotine dependence: Secondary | ICD-10-CM | POA: Diagnosis not present

## 2019-05-28 DIAGNOSIS — N186 End stage renal disease: Secondary | ICD-10-CM | POA: Diagnosis not present

## 2019-05-28 DIAGNOSIS — I120 Hypertensive chronic kidney disease with stage 5 chronic kidney disease or end stage renal disease: Secondary | ICD-10-CM | POA: Diagnosis not present

## 2019-05-28 DIAGNOSIS — D631 Anemia in chronic kidney disease: Secondary | ICD-10-CM | POA: Diagnosis not present

## 2019-05-28 DIAGNOSIS — D696 Thrombocytopenia, unspecified: Secondary | ICD-10-CM | POA: Diagnosis not present

## 2019-05-28 DIAGNOSIS — Z992 Dependence on renal dialysis: Secondary | ICD-10-CM | POA: Diagnosis not present

## 2019-05-28 DIAGNOSIS — D61818 Other pancytopenia: Secondary | ICD-10-CM | POA: Diagnosis not present

## 2019-05-28 DIAGNOSIS — Z5189 Encounter for other specified aftercare: Secondary | ICD-10-CM | POA: Diagnosis not present

## 2019-05-28 DIAGNOSIS — Z923 Personal history of irradiation: Secondary | ICD-10-CM | POA: Diagnosis not present

## 2019-05-28 DIAGNOSIS — Z9221 Personal history of antineoplastic chemotherapy: Secondary | ICD-10-CM | POA: Diagnosis not present

## 2019-05-28 DIAGNOSIS — I252 Old myocardial infarction: Secondary | ICD-10-CM | POA: Diagnosis not present

## 2019-05-28 DIAGNOSIS — K219 Gastro-esophageal reflux disease without esophagitis: Secondary | ICD-10-CM | POA: Diagnosis not present

## 2019-05-28 DIAGNOSIS — C3481 Malignant neoplasm of overlapping sites of right bronchus and lung: Secondary | ICD-10-CM | POA: Diagnosis not present

## 2019-05-28 DIAGNOSIS — R5383 Other fatigue: Secondary | ICD-10-CM | POA: Diagnosis not present

## 2019-05-28 DIAGNOSIS — E785 Hyperlipidemia, unspecified: Secondary | ICD-10-CM | POA: Diagnosis not present

## 2019-05-28 MED ORDER — ACETAMINOPHEN 325 MG PO TABS
ORAL_TABLET | ORAL | Status: AC
  Filled 2019-05-28: qty 2

## 2019-05-28 MED ORDER — DIPHENHYDRAMINE HCL 25 MG PO CAPS
ORAL_CAPSULE | ORAL | Status: AC
  Filled 2019-05-28: qty 1

## 2019-05-28 MED ORDER — ACETAMINOPHEN 325 MG PO TABS
650.0000 mg | ORAL_TABLET | Freq: Once | ORAL | Status: AC
  Administered 2019-05-28: 650 mg via ORAL

## 2019-05-28 MED ORDER — DIPHENHYDRAMINE HCL 25 MG PO CAPS
25.0000 mg | ORAL_CAPSULE | Freq: Once | ORAL | Status: AC
  Administered 2019-05-28: 25 mg via ORAL

## 2019-05-28 MED ORDER — SODIUM CHLORIDE 0.9% IV SOLUTION
250.0000 mL | Freq: Once | INTRAVENOUS | Status: AC
  Administered 2019-05-28: 250 mL via INTRAVENOUS
  Filled 2019-05-28: qty 250

## 2019-05-28 NOTE — Patient Instructions (Signed)
Platelet Count Test Why am I having this test? Platelets are specialized cells that help the blood clot. When you get a tissue injury like a cut, platelets gather at the site of the injury to stop the bleeding. You may have a platelet count test:  If you have symptoms that may be related to excess bleeding or delayed blood clotting, such as: ? A rash of pinprick-sized red and purple dots on the skin (petechiae). These are small collections of blood (hemorrhages) in the skin. ? Heavy menstrual bleeding.  To help monitor treatment for: ? Thrombocytopenia. This is a condition in which you have a low platelet count. ? Bone marrow failure. What is being tested? This test measures how many platelets you have within a specific amount (volume) of blood. What kind of sample is taken?  A blood sample is required for this test. It is usually collected by inserting a needle into a blood vessel or by sticking a finger with a small needle. Tell a health care provider about:  Any allergies you have.  All medicines you are taking, including vitamins, herbs, eye drops, creams, and over-the-counter medicines.  Any blood disorders you have.  Any surgeries you have had.  Any medical conditions you have.  Whether you are pregnant or may be pregnant. How are the results reported? Your test results will be reported as a value that indicates how many platelets are in the blood volume. This will be given as platelets per cubic millimeter (mm3) of blood. Your health care provider will compare your results to normal ranges that were established after testing a large group of people (reference ranges). Reference ranges may vary among labs and hospitals. For this test, common reference ranges are:  Adult or elderly: 150,000-400,000/mm3.  Child: 150,000-400,000/mm3.  Infant: 200,000-475,000/mm3.  Premature infant: 100,000-300,000/mm3.  Newborn: 150,000-300,000/mm3. What do the results mean? A result  that is within your reference range is considered normal, meaning that you have a normal amount of platelets in your blood. A result that is higher than your reference range means that you have too many platelets in your blood. This may mean that you have:  Certain types of cancer, such as leukemia or lymphoma.  A condition in which the bone marrow produces excess amounts of all cell types, including platelets (polycythemia vera).  A condition that can occur after surgery to remove the spleen (post-splenectomy syndrome).  Rheumatoid arthritis.  Anemia due to lack of iron (iron-deficiency anemia). A result that is lower than your reference range means that you have too few platelets in your blood. This may mean that you have:  A condition in which the spleen breaks down platelets faster than normal (hypersplenism).  A hemorrhage somewhere in your body.  Low platelet count due to your body's disease-fighting system attacking your platelets (immune thrombocytopenia).  Cancer. Chemotherapy treatments for cancers such as leukemia can also cause low platelet count.  A rare, serious form of thrombocytopenia that causes blood clots (thrombotic thrombocytopenia).  HELLP syndrome, a disorder of pregnancy that causes high blood pressure and other serious problems.  Certain disorders that are passed from parent to child (inherited) that cause a low platelet count.  A condition in which the proteins that control blood clotting are overactive, causing abnormal clotting processes to occur (disseminated intravascular coagulation, DIC).  A disease that causes long-term inflammation and pain in many parts of the body (systemic lupus erythematosus, SLE).  Certain types of anemia, such as pernicious anemia or hemolytic anemia.  Infection. Talk with your health care provider about what your results mean. Questions to ask your health care provider Ask your health care provider, or the department that  is doing the test:  When will my results be ready?  How will I get my results?  What are my treatment options?  What other tests do I need?  What are my next steps? Summary  Platelets are specialized cells that help the blood clot. When you get a tissue injury like a cut, platelets gather at the site of the injury to stop the bleeding.  This test measures how many platelets you have within a specific amount (volume) of blood.  Talk with your health care provider about what your results mean. This information is not intended to replace advice given to you by your health care provider. Make sure you discuss any questions you have with your health care provider. Document Released: 11/24/2004 Document Revised: 07/23/2017 Document Reviewed: 07/23/2017 Elsevier Patient Education  2020 [employer].

## 2019-05-29 ENCOUNTER — Other Ambulatory Visit: Payer: Self-pay | Admitting: *Deleted

## 2019-05-29 DIAGNOSIS — E876 Hypokalemia: Secondary | ICD-10-CM | POA: Diagnosis not present

## 2019-05-29 DIAGNOSIS — D689 Coagulation defect, unspecified: Secondary | ICD-10-CM | POA: Diagnosis not present

## 2019-05-29 DIAGNOSIS — C3411 Malignant neoplasm of upper lobe, right bronchus or lung: Secondary | ICD-10-CM

## 2019-05-29 DIAGNOSIS — E039 Hypothyroidism, unspecified: Secondary | ICD-10-CM | POA: Diagnosis not present

## 2019-05-29 DIAGNOSIS — N186 End stage renal disease: Secondary | ICD-10-CM | POA: Diagnosis not present

## 2019-05-29 DIAGNOSIS — N2581 Secondary hyperparathyroidism of renal origin: Secondary | ICD-10-CM | POA: Diagnosis not present

## 2019-05-29 DIAGNOSIS — D631 Anemia in chronic kidney disease: Secondary | ICD-10-CM | POA: Diagnosis not present

## 2019-05-29 DIAGNOSIS — Z23 Encounter for immunization: Secondary | ICD-10-CM | POA: Diagnosis not present

## 2019-05-29 LAB — BPAM PLATELET PHERESIS
Blood Product Expiration Date: 202007162359
ISSUE DATE / TIME: 202007150957
Unit Type and Rh: 5100

## 2019-05-29 LAB — PREPARE PLATELET PHERESIS: Unit division: 0

## 2019-05-30 ENCOUNTER — Encounter (HOSPITAL_COMMUNITY): Payer: Self-pay

## 2019-05-30 ENCOUNTER — Other Ambulatory Visit: Payer: Self-pay

## 2019-05-30 ENCOUNTER — Ambulatory Visit (HOSPITAL_COMMUNITY)
Admission: RE | Admit: 2019-05-30 | Discharge: 2019-05-30 | Disposition: A | Discharge status: Home / Self Care | Payer: Medicare Other | Source: Ambulatory Visit | Attending: Physician Assistant | Admitting: Physician Assistant

## 2019-05-30 DIAGNOSIS — C3411 Malignant neoplasm of upper lobe, right bronchus or lung: Secondary | ICD-10-CM | POA: Insufficient documentation

## 2019-05-30 DIAGNOSIS — C3490 Malignant neoplasm of unspecified part of unspecified bronchus or lung: Secondary | ICD-10-CM | POA: Diagnosis not present

## 2019-05-31 DIAGNOSIS — E876 Hypokalemia: Secondary | ICD-10-CM | POA: Diagnosis not present

## 2019-05-31 DIAGNOSIS — N186 End stage renal disease: Secondary | ICD-10-CM | POA: Diagnosis not present

## 2019-05-31 DIAGNOSIS — D631 Anemia in chronic kidney disease: Secondary | ICD-10-CM | POA: Diagnosis not present

## 2019-05-31 DIAGNOSIS — D689 Coagulation defect, unspecified: Secondary | ICD-10-CM | POA: Diagnosis not present

## 2019-05-31 DIAGNOSIS — Z23 Encounter for immunization: Secondary | ICD-10-CM | POA: Diagnosis not present

## 2019-05-31 DIAGNOSIS — E039 Hypothyroidism, unspecified: Secondary | ICD-10-CM | POA: Diagnosis not present

## 2019-05-31 DIAGNOSIS — N2581 Secondary hyperparathyroidism of renal origin: Secondary | ICD-10-CM | POA: Diagnosis not present

## 2019-06-02 ENCOUNTER — Telehealth: Payer: Self-pay | Admitting: Internal Medicine

## 2019-06-02 ENCOUNTER — Inpatient Hospital Stay (HOSPITAL_BASED_OUTPATIENT_CLINIC_OR_DEPARTMENT_OTHER): Payer: Medicare Other | Admitting: Internal Medicine

## 2019-06-02 ENCOUNTER — Telehealth: Payer: Self-pay | Admitting: Medical Oncology

## 2019-06-02 ENCOUNTER — Encounter: Payer: Self-pay | Admitting: Internal Medicine

## 2019-06-02 ENCOUNTER — Other Ambulatory Visit: Payer: Self-pay

## 2019-06-02 VITALS — BP 162/81 | HR 77 | Temp 98.6°F | Resp 17 | Ht 69.0 in | Wt 195.4 lb

## 2019-06-02 DIAGNOSIS — I120 Hypertensive chronic kidney disease with stage 5 chronic kidney disease or end stage renal disease: Secondary | ICD-10-CM | POA: Diagnosis not present

## 2019-06-02 DIAGNOSIS — Z992 Dependence on renal dialysis: Secondary | ICD-10-CM

## 2019-06-02 DIAGNOSIS — C3481 Malignant neoplasm of overlapping sites of right bronchus and lung: Secondary | ICD-10-CM | POA: Diagnosis not present

## 2019-06-02 DIAGNOSIS — Z79899 Other long term (current) drug therapy: Secondary | ICD-10-CM

## 2019-06-02 DIAGNOSIS — Z923 Personal history of irradiation: Secondary | ICD-10-CM | POA: Diagnosis not present

## 2019-06-02 DIAGNOSIS — E039 Hypothyroidism, unspecified: Secondary | ICD-10-CM | POA: Diagnosis not present

## 2019-06-02 DIAGNOSIS — K219 Gastro-esophageal reflux disease without esophagitis: Secondary | ICD-10-CM | POA: Diagnosis not present

## 2019-06-02 DIAGNOSIS — M109 Gout, unspecified: Secondary | ICD-10-CM | POA: Diagnosis not present

## 2019-06-02 DIAGNOSIS — Z9221 Personal history of antineoplastic chemotherapy: Secondary | ICD-10-CM

## 2019-06-02 DIAGNOSIS — D631 Anemia in chronic kidney disease: Secondary | ICD-10-CM

## 2019-06-02 DIAGNOSIS — I252 Old myocardial infarction: Secondary | ICD-10-CM

## 2019-06-02 DIAGNOSIS — C3411 Malignant neoplasm of upper lobe, right bronchus or lung: Secondary | ICD-10-CM

## 2019-06-02 DIAGNOSIS — N186 End stage renal disease: Secondary | ICD-10-CM

## 2019-06-02 DIAGNOSIS — R5383 Other fatigue: Secondary | ICD-10-CM

## 2019-06-02 DIAGNOSIS — D61818 Other pancytopenia: Secondary | ICD-10-CM

## 2019-06-02 DIAGNOSIS — E785 Hyperlipidemia, unspecified: Secondary | ICD-10-CM | POA: Diagnosis not present

## 2019-06-02 DIAGNOSIS — D696 Thrombocytopenia, unspecified: Secondary | ICD-10-CM | POA: Diagnosis not present

## 2019-06-02 DIAGNOSIS — N183 Chronic kidney disease, stage 3 unspecified: Secondary | ICD-10-CM

## 2019-06-02 DIAGNOSIS — I10 Essential (primary) hypertension: Secondary | ICD-10-CM

## 2019-06-02 DIAGNOSIS — C3490 Malignant neoplasm of unspecified part of unspecified bronchus or lung: Secondary | ICD-10-CM

## 2019-06-02 DIAGNOSIS — Z5189 Encounter for other specified aftercare: Secondary | ICD-10-CM | POA: Diagnosis not present

## 2019-06-02 DIAGNOSIS — Z955 Presence of coronary angioplasty implant and graft: Secondary | ICD-10-CM | POA: Diagnosis not present

## 2019-06-02 DIAGNOSIS — J449 Chronic obstructive pulmonary disease, unspecified: Secondary | ICD-10-CM | POA: Diagnosis not present

## 2019-06-02 DIAGNOSIS — Z87891 Personal history of nicotine dependence: Secondary | ICD-10-CM | POA: Diagnosis not present

## 2019-06-02 DIAGNOSIS — Z5111 Encounter for antineoplastic chemotherapy: Secondary | ICD-10-CM | POA: Diagnosis not present

## 2019-06-02 NOTE — Telephone Encounter (Signed)
Scheduled appt per 7/20 los - reminder letter mailed/

## 2019-06-02 NOTE — Progress Notes (Signed)
[facility] [phone]   [phone]  OFFICE PROGRESS NOTE  Jeremy Noble, MD [address] [place] [address]  DIAGNOSIS: Limited stage (T2b, N1, M0) small cell lung cancer presented with right upper lobe with invasion of superior segment of right lower lobe as well as right hilar lymphadenopathy diagnosed in March 2020.  PRIOR THERAPY: None.  CURRENT THERAPY: Systemic chemotherapy with carboplatin for AUC of 5 on day 1 and etoposide 100 mg/M2 on days 1, 2 and 3 every 3 weeks.  Status post 4 cycles.  INTERVAL HISTORY: Jeremy Johnson 73 y.o. male returns to the clinic today for follow-up visit.  His wife Inez Catalina was available by phone during the visit.  The patient is feeling fine today with no concerning complaints except for fatigue.  He is currently on hemodialysis because of the end-stage renal disease.  He completed 4 cycles of systemic chemotherapy with carboplatin and 2 etoposide in addition to concurrent radiation.  He is feeling much better.  He has significant pancytopenia after the last cycle of his treatment.  He required PRBCs transfusion as well as platelet transfusion.  The patient denied having any current nausea, vomiting, diarrhea or constipation.  He denied having any fever or chills.  He has no chest pain, shortness of breath, cough or hemoptysis.  He had repeat CT scan of the chest performed recently and is here for evaluation and discussion of his scan results.  MEDICAL HISTORY: Past Medical History:  Diagnosis Date   Anemia    Blood transfusion without reported diagnosis    CAD (coronary artery disease)    STENT... MID CIRCUMFLEX...1997   Chronic kidney disease    STAGE 3   COPD (chronic obstructive pulmonary disease) (HCC)    Degenerative joint disease (DJD) of lumbar spine    GERD (gastroesophageal reflux disease)    Gout    Hyperlipidemia    Hypertension    Hypothyroidism    Incisional hernia    abdomen   Leukocytosis    CHRONIC MILD   Myocardial infarction ([place])    1997   SCL CA dx'd 01/2019   Lung cancer    ALLERGIES:  is allergic to penicillins.  MEDICATIONS:  Current Outpatient Medications  Medication Sig Dispense Refill   acetaminophen (TYLENOL) 325 MG tablet Take 2 tablets (650 mg total) by mouth every 6 (six) hours as needed for mild pain (or Fever >/= 101). 30 tablet 0   allopurinol (ZYLOPRIM) 100 MG tablet Take 1 tablet (100 mg total) by mouth daily. 30 tablet 1   Darbepoetin Alfa (ARANESP) 100 MCG/0.5ML SOSY injection Inject 0.5 mLs (100 mcg total) into the vein every Thursday with hemodialysis. 4.2 mL    levothyroxine (SYNTHROID, LEVOTHROID) 175 MCG tablet Take 175 mcg by mouth daily before breakfast.     metoprolol tartrate (LOPRESSOR) 50 MG tablet Take 25 mg by mouth 2 (two) times daily.      omeprazole (PRILOSEC) 20 MG capsule Take 20 mg by mouth daily.     polyethylene glycol (MIRALAX / GLYCOLAX) 17 g packet Take 17 g by mouth daily. 14 each 0   sevelamer carbonate (RENVELA) 800 MG tablet Take 2 tablets (1,600 mg total) by mouth 3 (three) times daily with meals. 180 tablet 2   simvastatin (ZOCOR) 20 MG tablet Take 20 mg by mouth at bedtime.      sucralfate (CARAFATE) 1 g tablet Take 1 tablet (1 g total) by mouth 4 (four)  times daily -  with meals and at bedtime. 5 min before meals for radiation induced esophagitis (Patient taking differently: Take 1 g by mouth 3 (three) times daily as needed (stomach irritation). 5 min before meals for radiation induced esophagitis) 120 tablet 2   No current facility-administered medications for this visit.     SURGICAL HISTORY:  Past Surgical History:  Procedure Laterality Date   ABDOMINAL AORTIC ANEURYSM REPAIR  2006   AV FISTULA PLACEMENT Left 04/25/2019   Procedure: ARTERIOVENOUS (AV) FISTULA CREATION LEFT ARM;  Surgeon: Angelia Mould, MD;  Location: [facility];  Service: Vascular;  Laterality: Left;    AV FISTULA PLACEMENT Left 05/19/2019   Procedure: CONVERSION OF LEFT ARM ARTERIOVENOUS FISTULA TO GRAFT;  Surgeon: Angelia Mould, MD;  Location: [facility];  Service: Vascular;  Laterality: Left;   BIOPSY  09/30/2018   Procedure: BIOPSY;  Surgeon: Danie Binder, MD;  [facility];  Service: Endoscopy;;  ascending colon   COLONOSCOPY  2008   COLONOSCOPY N/A 09/30/2018   Procedure: COLONOSCOPY;  Surgeon: Danie Binder, MD;  [facility];  Service: Endoscopy;  Laterality: N/A;  9:00   CORONARY ANGIOPLASTY WITH STENT PLACEMENT  1997   MID CIRCUMFLEX   IR FLUORO GUIDE CV LINE RIGHT  04/22/2019   IR US GUIDE [place] RIGHT  04/22/2019   POLYPECTOMY  09/30/2018   Procedure: POLYPECTOMY;  Surgeon: Danie Binder, MD;  [facility];  Service: Endoscopy;;  colon   VIDEO BRONCHOSCOPY WITH ENDOBRONCHIAL NAVIGATION N/A 01/27/2019   Procedure: VIDEO BRONCHOSCOPY WITH ENDOBRONCHIAL NAVIGATION;  Surgeon: Grace Isaac, MD;  Location: [facility];  Service: Thoracic;  Laterality: N/A;   VIDEO BRONCHOSCOPY WITH ENDOBRONCHIAL ULTRASOUND N/A 01/27/2019   Procedure: VIDEO BRONCHOSCOPY WITH ENDOBRONCHIAL ULTRASOUND;  Surgeon: Grace Isaac, MD;  Location: [facility];  Service: Thoracic;  Laterality: N/A;    REVIEW OF SYSTEMS:  Constitutional: positive for fatigue Eyes: negative Ears, nose, mouth, throat, and face: negative Respiratory: negative Cardiovascular: negative Gastrointestinal: negative Genitourinary:negative Integument/breast: negative Hematologic/lymphatic: negative Musculoskeletal:negative Neurological: negative Behavioral/Psych: negative Endocrine: negative Allergic/Immunologic: negative   PHYSICAL EXAMINATION: General appearance: alert, cooperative, fatigued and no distress Head: Normocephalic, without obvious abnormality, atraumatic Neck: no adenopathy, no JVD, supple, symmetrical, trachea midline and thyroid not enlarged, symmetric, no  tenderness/mass/nodules Lymph nodes: Cervical, supraclavicular, and axillary nodes normal. Resp: clear to auscultation bilaterally Back: symmetric, no curvature. ROM normal. No CVA tenderness. Cardio: regular rate and rhythm, S1, S2 normal, no murmur, click, rub or gallop GI: soft, non-tender; bowel sounds normal; no masses,  no organomegaly Extremities: extremities normal, atraumatic, no cyanosis or edema Neurologic: Alert and oriented X 3, normal strength and tone. Normal symmetric reflexes. Normal coordination and gait  ECOG PERFORMANCE STATUS: 1 - Symptomatic but completely ambulatory  Blood pressure (!) 162/81, pulse 77, temperature 98.6 F (37 C), temperature source Oral, resp. rate 17, height 5\' 9"  (1.753 m), weight 195 lb 7 oz (88.6 kg), SpO2 98 %.  LABORATORY DATA: Lab Results  Component Value Date   WBC 7.1 05/26/2019   HGB 10.1 (L) 05/26/2019   HCT 30.1 (L) 05/26/2019   MCV 89.9 05/26/2019   PLT 12 (L) 05/26/2019      Chemistry      Component Value Date/Time   NA 136 05/26/2019 1224   K 4.1 05/26/2019 1224   CL 98 05/26/2019 1224   CO2 29 05/26/2019 1224   BUN 19 05/26/2019 1224   CREATININE 6.97 (  HH) 05/26/2019 1224      Component Value Date/Time   CALCIUM 7.8 (L) 05/26/2019 1224   ALKPHOS 116 05/26/2019 1224   AST 14 (L) 05/26/2019 1224   ALT 7 05/26/2019 1224   BILITOT 0.3 05/26/2019 1224       RADIOGRAPHIC STUDIES: Ct Chest Wo Contrast  Result Date: 05/30/2019 CLINICAL DATA:  Small-cell lung cancer. EXAM: CT CHEST WITHOUT CONTRAST TECHNIQUE: Multidetector CT imaging of the chest was performed following the standard protocol without IV contrast. COMPARISON:  03/24/2019 FINDINGS: Cardiovascular: The heart size is normal. No substantial pericardial effusion. Coronary artery calcification is evident. Atherosclerotic calcification is noted in the wall of the thoracic aorta. The focal aneurysmal dilatation along the proximal transverse thoracic aorta is stable,  measuring 4.7 cm diameter today. Right IJ central line tip projects in the upper right atrium. Mediastinum/Nodes: No mediastinal lymphadenopathy. No evidence for gross hilar lymphadenopathy although assessment is limited by the lack of intravenous contrast on today's study. The esophagus has normal imaging features. There is no axillary lymphadenopathy. Lungs/Pleura: Centrilobular emphsyema noted. 10 mm perifissural nodule in the right lung (109/10) is stable. No new suspicious pulmonary nodule or mass. No focal airspace consolidation. No pleural effusion. There is some minimal atelectasis in the posterior right costophrenic sulcus. Upper Abdomen: There is abdominal aortic atherosclerosis without aneurysm. Musculoskeletal: No worrisome lytic or sclerotic osseous abnormality.Supraumbilical ventral hernia has been incompletely visualized. IMPRESSION: 1. Stable exam.  No new or progressive interval findings. 2.  Aortic Atherosclerois (ICD10-170.0) 3.  Emphysema. ([id number]) Electronically Signed   By: Misty Stanley M.D.   On: 05/30/2019 17:22   Dg Chest Portable 1 View  Result Date: 05/22/2019 CLINICAL DATA:  Shortness of breath. EXAM: PORTABLE CHEST 1 VIEW COMPARISON:  04/14/2019. FINDINGS: Normal sized heart. Tortuous aorta. Clear lungs with stable biapical pleural and parenchymal scarring. The lungs are mildly hyperexpanded. Interval right jugular catheter with its tip at the superior cavoatrial junction. No pneumothorax. Thoracic spine degenerative changes. IMPRESSION: No acute abnormality. Mild changes of COPD. Electronically Signed   By: Claudie Revering M.D.   On: 05/22/2019 15:49   Vas US Duplex Dialysis Access (avf, Avg)  Result Date: 05/14/2019 DIALYSIS ACCESS Reason for Exam: No palpable thrill for AVF/AVG. Access Site: Left Upper Extremity. Access Type: Brachial-cephalic AVF 2/80/0349 Performing Technologist: Ronal Fear RVS, RCS  Examination Guidelines: A complete evaluation includes B-mode  imaging, spectral Doppler, color Doppler, and power Doppler as needed of all accessible portions of each vessel. Unilateral testing is considered an integral part of a complete examination. Limited examinations for reoccurring indications may be performed as noted.  Findings: +--------------------+----------+-----------------+--------+  AVF                  PSV (cm/s) Flow Vol (mL/min) Comments  +--------------------+----------+-----------------+--------+  Native artery inflow     98                                 +--------------------+----------+-----------------+--------+  AVF Anastomosis          62                                 +--------------------+----------+-----------------+--------+  +------------+----------+-------------+----------+-------------------+  OUTFLOW VEIN PSV (cm/s) Diameter (cm) Depth (cm)      Describe        +------------+----------+-------------+----------+-------------------+  Shoulder  0.21         1.18                         +------------+----------+-------------+----------+-------------------+  Prox UA                     0.23         0.68                         +------------+----------+-------------+----------+-------------------+  Mid UA                      0.37         0.52                         +------------+----------+-------------+----------+-------------------+  Dist UA                     0.43         0.31    partially-occlusive  +------------+----------+-------------+----------+-------------------+  AC Fossa                    0.22                      occluded        +------------+----------+-------------+----------+-------------------+  +-----------------+-------------+----------+---------+----------+--------------+  basilic vein:     Diameter (cm) Depth (cm) Branching PSV (cm/s)  Flow Volume                                                                        (ml/min)      +-----------------+-------------+----------+---------+----------+--------------+  proximal upper        0.62                                                       arm                                                                             +-----------------+-------------+----------+---------+----------+--------------+  mid upper arm         0.55                                                      +-----------------+-------------+----------+---------+----------+--------------+  distal upper arm      0.52                                                      +-----------------+-------------+----------+---------+----------+--------------+  antecubital fossa     0.33                   0.22                               +-----------------+-------------+----------+---------+----------+--------------+  Summary: Arteriovenous fistula-Thrombus noted in the antecubital fossa region with no color or Doppler flow.  Widely patent left basilic vein.  *See table(s) above for measurements and observations.  Diagnosing physician: [name] MD Electronically signed by [name] MD on 05/14/2019 at 4:59:25 PM.    --------------------------------------------------------------------------------   Final     ASSESSMENT AND PLAN: This is a very pleasant 73 years old white male with limited stage small cell lung cancer and currently undergoing systemic chemotherapy with carboplatin and etoposide status post 4 cycles. The patient tolerated his treatment well except for the pancytopenia that was complicated by his end-stage renal disease. He had repeat CT scan of the chest performed recently.  I personally and independently reviewed the scans and discussed the results with the patient and his wife today. His a scan showed no concerning findings for disease progression. I recommended for the patient to continue on observation with repeat CT scan of the chest in 3 months. He will see Dr. Tammi Klippel in few weeks to  discuss the prophylactic cranial irradiation. For the end-stage renal disease, the patient will continue on hemodialysis in [place]. He was advised to call immediately if he has any concerning symptoms in the interval. The patient voices understanding of current disease status and treatment options and is in agreement with the current care plan.  All questions were answered. The patient knows to call the clinic with any problems, questions or concerns. We can certainly see the patient much sooner if necessary.  Disclaimer: This note was dictated with voice recognition software. Similar sounding words can inadvertently be transcribed and may not be corrected upon review.

## 2019-06-03 ENCOUNTER — Other Ambulatory Visit: Payer: Self-pay

## 2019-06-03 DIAGNOSIS — Z992 Dependence on renal dialysis: Secondary | ICD-10-CM

## 2019-06-03 DIAGNOSIS — D689 Coagulation defect, unspecified: Secondary | ICD-10-CM | POA: Diagnosis not present

## 2019-06-03 DIAGNOSIS — N186 End stage renal disease: Secondary | ICD-10-CM | POA: Diagnosis not present

## 2019-06-03 DIAGNOSIS — E039 Hypothyroidism, unspecified: Secondary | ICD-10-CM | POA: Diagnosis not present

## 2019-06-03 DIAGNOSIS — D631 Anemia in chronic kidney disease: Secondary | ICD-10-CM | POA: Diagnosis not present

## 2019-06-03 DIAGNOSIS — Z23 Encounter for immunization: Secondary | ICD-10-CM | POA: Diagnosis not present

## 2019-06-03 DIAGNOSIS — N2581 Secondary hyperparathyroidism of renal origin: Secondary | ICD-10-CM | POA: Diagnosis not present

## 2019-06-03 DIAGNOSIS — E876 Hypokalemia: Secondary | ICD-10-CM | POA: Diagnosis not present

## 2019-06-03 NOTE — Progress Notes (Signed)
  Radiation Oncology         [phone]) [phone] ________________________________  Name: Jeremy Johnson MRN: [id number]  Date: 05/15/2019  DOB: [birth year]  End of Treatment Note  Diagnosis:   73 y.o. male with limited stage small cell lung cancer of the left upper lung  Indication for treatment:  Curative       Radiation treatment dates:   03/17/2019 - 04/14/2019, break in RT due to hospitalization, 04/28/2019 - 05/15/2019  Site/dose:   The left lung was treated to 59.4 Gy in 33 fractions of 1.8 Gy- Concurrent with systemic chemotherapy.  Beams/energy:   3D / 6X, 10X Photon   Narrative: The patient tolerated radiation treatment relatively well.   He denied any chest pain, cough, shortness of breath, or difficulty swallowing. He is applying Radiaplex gel to his skin as directed and denied any skin changes within treatment field.  He lost approximately 18 lbs during his two-week break from RT due to hospitalization related to kidney failure. He now takes dialysis every Tuesday, Thursday, and Saturday.   He completed treatment feeling well with no further complaints.   Plan: The patient has completed radiation treatment. The patient will return to radiation oncology clinic for routine followup in one month. I advised him to call or return sooner if he has any questions or concerns related to his recovery or treatment. ________________________________  Sheral Apley. Tammi Klippel, M.D.  This document serves as a record of services personally performed by Tyler Pita, MD. It was created on his behalf by Rae Lips, a trained medical scribe. The creation of this record is based on the scribe's personal observations and the provider's statements to them. This document has been checked and approved by the attending provider.

## 2019-06-03 NOTE — Telephone Encounter (Signed)
Lab Results received.

## 2019-06-04 DIAGNOSIS — D61810 Antineoplastic chemotherapy induced pancytopenia: Secondary | ICD-10-CM | POA: Diagnosis not present

## 2019-06-04 DIAGNOSIS — I712 Thoracic aortic aneurysm, without rupture: Secondary | ICD-10-CM | POA: Diagnosis not present

## 2019-06-04 DIAGNOSIS — N186 End stage renal disease: Secondary | ICD-10-CM | POA: Diagnosis not present

## 2019-06-04 DIAGNOSIS — I10 Essential (primary) hypertension: Secondary | ICD-10-CM | POA: Diagnosis not present

## 2019-06-04 DIAGNOSIS — M109 Gout, unspecified: Secondary | ICD-10-CM | POA: Diagnosis not present

## 2019-06-05 DIAGNOSIS — Z23 Encounter for immunization: Secondary | ICD-10-CM | POA: Diagnosis not present

## 2019-06-05 DIAGNOSIS — E039 Hypothyroidism, unspecified: Secondary | ICD-10-CM | POA: Diagnosis not present

## 2019-06-05 DIAGNOSIS — E876 Hypokalemia: Secondary | ICD-10-CM | POA: Diagnosis not present

## 2019-06-05 DIAGNOSIS — D689 Coagulation defect, unspecified: Secondary | ICD-10-CM | POA: Diagnosis not present

## 2019-06-05 DIAGNOSIS — D631 Anemia in chronic kidney disease: Secondary | ICD-10-CM | POA: Diagnosis not present

## 2019-06-05 DIAGNOSIS — N186 End stage renal disease: Secondary | ICD-10-CM | POA: Diagnosis not present

## 2019-06-05 DIAGNOSIS — N2581 Secondary hyperparathyroidism of renal origin: Secondary | ICD-10-CM | POA: Diagnosis not present

## 2019-06-06 ENCOUNTER — Telehealth (HOSPITAL_COMMUNITY): Payer: Self-pay

## 2019-06-06 NOTE — Telephone Encounter (Signed)
The above patient or their representative was contacted and gave the following answers to these questions:         Do you have any of the following symptoms? NO  Fever                    Cough                   Shortness of breath  Do  you have any of the following other symptoms?    muscle pain         vomiting,        diarrhea        rash         weakness        red eye        abdominal pain         bruising          bruising or bleeding              joint pain           severe headache    Have you been in contact with someone who was or has been sick in the past 2 weeks? No  Yes                 Unsure                         Unable to assess   Does the person that you were in contact with have any of the following symptoms?   Cough         shortness of breath           muscle pain         vomiting,            diarrhea            rash            weakness           fever            red eye           abdominal pain           bruising  or  bleeding                joint pain                severe headache

## 2019-06-07 DIAGNOSIS — D689 Coagulation defect, unspecified: Secondary | ICD-10-CM | POA: Diagnosis not present

## 2019-06-07 DIAGNOSIS — Z23 Encounter for immunization: Secondary | ICD-10-CM | POA: Diagnosis not present

## 2019-06-07 DIAGNOSIS — N186 End stage renal disease: Secondary | ICD-10-CM | POA: Diagnosis not present

## 2019-06-07 DIAGNOSIS — D631 Anemia in chronic kidney disease: Secondary | ICD-10-CM | POA: Diagnosis not present

## 2019-06-07 DIAGNOSIS — N2581 Secondary hyperparathyroidism of renal origin: Secondary | ICD-10-CM | POA: Diagnosis not present

## 2019-06-07 DIAGNOSIS — E876 Hypokalemia: Secondary | ICD-10-CM | POA: Diagnosis not present

## 2019-06-07 DIAGNOSIS — E039 Hypothyroidism, unspecified: Secondary | ICD-10-CM | POA: Diagnosis not present

## 2019-06-09 ENCOUNTER — Ambulatory Visit (INDEPENDENT_AMBULATORY_CARE_PROVIDER_SITE_OTHER): Payer: Self-pay | Admitting: Family

## 2019-06-09 ENCOUNTER — Ambulatory Visit (HOSPITAL_COMMUNITY)
Admission: RE | Admit: 2019-06-09 | Discharge: 2019-06-09 | Disposition: A | Discharge status: Home / Self Care | Payer: Medicare Other | Source: Ambulatory Visit | Attending: Family | Admitting: Family

## 2019-06-09 ENCOUNTER — Encounter: Payer: Self-pay | Admitting: Family

## 2019-06-09 ENCOUNTER — Other Ambulatory Visit: Payer: Self-pay

## 2019-06-09 VITALS — BP 158/87 | HR 75 | Temp 97.2°F | Resp 16 | Ht 69.0 in | Wt 194.0 lb

## 2019-06-09 DIAGNOSIS — Z992 Dependence on renal dialysis: Secondary | ICD-10-CM

## 2019-06-09 DIAGNOSIS — Z9889 Other specified postprocedural states: Secondary | ICD-10-CM

## 2019-06-09 DIAGNOSIS — Z95828 Presence of other vascular implants and grafts: Secondary | ICD-10-CM

## 2019-06-09 DIAGNOSIS — N186 End stage renal disease: Secondary | ICD-10-CM | POA: Diagnosis not present

## 2019-06-09 NOTE — Progress Notes (Signed)
CC: AV graft duplex follow up s/p AVG creation on 05-19-19  History of Present Illness  Jeremy Johnson is a 73 y.o. ([birth year]) male who is s/p creation of new left upper arm AV graft (4-7 mm PTFE graft) on 05-19-19 by Dr. Scot Dock. He had a left brachiocephalic fistula placed which failed very quickly.  The basilic vein was not adequate.  He returns today for duplex follow up of AV graft.   He states that in 2006 Dr. Amedeo Plenty performed an open AAA repair. Pt denies back pain or abdominal pain. He has an asymptomatic incisional hernia from this.   He started hemodialysis via right IJ TDC on T-T-S at [facility] in [place]. He denies tingling or numbness in his left hand or arm. He is right hand dominant.    He was undergoing treatment for small cell lung cancer, states it is currently in remission and the chemotherapy has bee  Stopped.  He is a former smoker, quit in 2018, smoked 1/2 ppd x 54 years   Past Medical History:  Diagnosis Date  . Anemia   . Blood transfusion without reported diagnosis   . CAD (coronary artery disease)    STENT... MID CIRCUMFLEX...1997  . Chronic kidney disease    STAGE 3  . COPD (chronic obstructive pulmonary disease) ([place])   . Degenerative joint disease (DJD) of lumbar spine   . GERD (gastroesophageal reflux disease)   . Gout   . Hyperlipidemia   . Hypertension   . Hypothyroidism   . Incisional hernia    abdomen  . Leukocytosis    CHRONIC MILD  . Myocardial infarction ([place])    1997  . SCL CA dx'd 01/2019   Lung cancer    Social History Social History   Tobacco Use  . Smoking status: Former Smoker    Packs/day: 0.50    Years: 54.00    Pack years: 27.00    Quit date: 09/17/2017    Years since quitting: 1.7  . Smokeless tobacco: Never Used  Substance Use Topics  . Alcohol use: Yes    Comment: occasional  . Drug use: No    Family History Family History  Problem Relation Age of Onset  . Stroke Brother   . Lung cancer Sister 61      lung cancer/former    Surgical History Past Surgical History:  Procedure Laterality Date  . ABDOMINAL AORTIC ANEURYSM REPAIR  2006  . AV FISTULA PLACEMENT Left 04/25/2019   Procedure: ARTERIOVENOUS (AV) FISTULA CREATION LEFT ARM;  Surgeon: Angelia Mould, MD;  Location: [facility];  Service: Vascular;  Laterality: Left;  . AV FISTULA PLACEMENT Left 05/19/2019   Procedure: CONVERSION OF LEFT ARM ARTERIOVENOUS FISTULA TO GRAFT;  Surgeon: Angelia Mould, MD;  Location: [facility];  Service: Vascular;  Laterality: Left;  . BIOPSY  09/30/2018   Procedure: BIOPSY;  Surgeon: Danie Binder, MD;  [facility];  Service: Endoscopy;;  ascending colon  . COLONOSCOPY  2008  . COLONOSCOPY N/A 09/30/2018   Procedure: COLONOSCOPY;  Surgeon: Danie Binder, MD;  [facility];  Service: Endoscopy;  Laterality: N/A;  9:00  . CORONARY ANGIOPLASTY WITH STENT [place]  . IR FLUORO GUIDE CV LINE RIGHT  04/22/2019  . IR US GUIDE VASC ACCESS RIGHT  04/22/2019  . POLYPECTOMY  09/30/2018   Procedure: POLYPECTOMY;  Surgeon: Danie Binder, MD;  [facility];  Service: Endoscopy;;  colon  . VIDEO BRONCHOSCOPY WITH ENDOBRONCHIAL NAVIGATION N/A 01/27/2019   Procedure: VIDEO BRONCHOSCOPY WITH ENDOBRONCHIAL NAVIGATION;  Surgeon: Grace Isaac, MD;  Location: [facility];  Service: Thoracic;  Laterality: N/A;  . VIDEO BRONCHOSCOPY WITH ENDOBRONCHIAL ULTRASOUND N/A 01/27/2019   Procedure: VIDEO BRONCHOSCOPY WITH ENDOBRONCHIAL ULTRASOUND;  Surgeon: Grace Isaac, MD;  Location: [facility];  Service: Thoracic;  Laterality: N/A;    Allergies  Allergen Reactions  . Penicillins Rash and Other (See Comments)    Has patient had a PCN reaction causing immediate rash, facial/tongue/throat swelling, SOB or lightheadedness with hypotension: No Has patient had a PCN reaction causing severe rash involving mucus membranes or skin necrosis: No Has patient had a PCN reaction  that required hospitalization: No Has patient had a PCN reaction occurring within the last 10 years: No If all of the above answers are "NO", then may proceed with Cephalosporin use.     Current Outpatient Medications  Medication Sig Dispense Refill  . acetaminophen (TYLENOL) 325 MG tablet Take 2 tablets (650 mg total) by mouth every 6 (six) hours as needed for mild pain (or Fever >/= 101). 30 tablet 0  . allopurinol (ZYLOPRIM) 100 MG tablet Take 1 tablet (100 mg total) by mouth daily. 30 tablet 1  . Darbepoetin Alfa (ARANESP) 100 MCG/0.5ML SOSY injection Inject 0.5 mLs (100 mcg total) into the vein every Thursday with hemodialysis. 4.2 mL   . levothyroxine (SYNTHROID, LEVOTHROID) 175 MCG tablet Take 175 mcg by mouth daily before breakfast.    . metoprolol tartrate (LOPRESSOR) 50 MG tablet Take 25 mg by mouth 2 (two) times daily.     [name] omeprazole (PRILOSEC) 20 MG capsule Take 20 mg by mouth daily.    . polyethylene glycol (MIRALAX / GLYCOLAX) 17 g packet Take 17 g by mouth daily. 14 each 0  . sevelamer carbonate (RENVELA) 800 MG tablet Take 2 tablets (1,600 mg total) by mouth 3 (three) times daily with meals. 180 tablet 2  . simvastatin (ZOCOR) 20 MG tablet Take 20 mg by mouth at bedtime.     . sucralfate (CARAFATE) 1 g tablet Take 1 tablet (1 g total) by mouth 4 (four) times daily -  with meals and at bedtime. 5 min before meals for radiation induced esophagitis (Patient taking differently: Take 1 g by mouth 3 (three) times daily as needed (stomach irritation). 5 min before meals for radiation induced esophagitis) 120 tablet 2   No current facility-administered medications for this visit.      REVIEW OF SYSTEMS: see HPI for pertinent positives and negatives    PHYSICAL EXAMINATION:  Vitals:   06/09/19 1122  BP: (!) 158/87  Pulse: 75  Resp: 16  Temp: (!) 97.2 F (36.2 C)  TempSrc: Temporal  SpO2: 100%  Weight: 194 lb (88 kg)  Height: 5\' 9"  (1.753 m)   Body mass index is 28.65  kg/m.  General: The patient appears his stated age.   HEENT:  No gross abnormalities Pulmonary: Respirations are non-labored Abdomen: Soft and non-tender  Musculoskeletal: There are no major deformities.   Neurologic: No focal weakness or paresthesias are detected, bilateral hand grip strength is 5/5. Skin: There are no ulcer or rashes noted. Psychiatric: The patient has normal affect. Cardiovascular: There is a regular rate and rhythm without significant murmur appreciated.  Left radial pulse is 1+ palpable.  Left upper arm AVG with brisk bruit in several segments and turbulence in other segments.  Palpation notes several segments of thrill and  several segments of turbulence.   Non-Invasive Vascular Imaging  Left upper arm Access Duplex  (Date: 06/09/2019):  +--------------------+----------+-----------------+--------+ AVG                 PSV (cm/s)Flow Vol (mL/min)Describe +--------------------+----------+-----------------+--------+ Native artery inflow   339                              +--------------------+----------+-----------------+--------+ Arterial anastomosis   664                              +--------------------+----------+-----------------+--------+ Prox graft             787                              +--------------------+----------+-----------------+--------+ Mid graft              240                              +--------------------+----------+-----------------+--------+ Distal graft           341                              +--------------------+----------+-----------------+--------+ Venous anastomosis     270                              +--------------------+----------+-----------------+--------+ Venous outflow         334                              +--------------------+----------+-----------------+--------+ Left subclavian vein was evaluated from anastomosis to proximal to the clavicle. No obvious outflow  stenosis identified.    Medical Decision Making  Jeremy Johnson is a 72 y.o. male who is s/p creation of new left upper arm AV graft (4-7 mm PTFE graft) on 05-19-19 by Dr. Scot Dock.  There are no signs of infection at the left upper arm AVG, he has no fever or chills.  I advised him to notify his nephrologist, dialysis center, or [place] if he develops pain or redness at his AVG site, or fever of chills.   He has swelling in his left arm that he states has improved. I advised him to elevate his left arm above his heart as often and as long as he can tolerate it to facilitate mobilization of subcutaneous fluid.    I spoke with Dr. Oneida Alar by phone re AVG placement on 05-19-19, no steal sx's, and turbulence and bruits auscultated in left arm AVG.   May try to access left upper arm AV graft no sooner than June 19 2019. Follow up with [place] as needed.   Clemon Chambers, RN, MSN, FNP-C [facility] Office: [phone]  06/09/2019, 11:31 AM  Clinic MD: Oneida Alar on call

## 2019-06-10 DIAGNOSIS — D631 Anemia in chronic kidney disease: Secondary | ICD-10-CM | POA: Diagnosis not present

## 2019-06-10 DIAGNOSIS — N2581 Secondary hyperparathyroidism of renal origin: Secondary | ICD-10-CM | POA: Diagnosis not present

## 2019-06-10 DIAGNOSIS — Z23 Encounter for immunization: Secondary | ICD-10-CM | POA: Diagnosis not present

## 2019-06-10 DIAGNOSIS — E039 Hypothyroidism, unspecified: Secondary | ICD-10-CM | POA: Diagnosis not present

## 2019-06-10 DIAGNOSIS — D689 Coagulation defect, unspecified: Secondary | ICD-10-CM | POA: Diagnosis not present

## 2019-06-10 DIAGNOSIS — N186 End stage renal disease: Secondary | ICD-10-CM | POA: Diagnosis not present

## 2019-06-10 DIAGNOSIS — E876 Hypokalemia: Secondary | ICD-10-CM | POA: Diagnosis not present

## 2019-06-12 DIAGNOSIS — D689 Coagulation defect, unspecified: Secondary | ICD-10-CM | POA: Diagnosis not present

## 2019-06-12 DIAGNOSIS — Z23 Encounter for immunization: Secondary | ICD-10-CM | POA: Diagnosis not present

## 2019-06-12 DIAGNOSIS — D631 Anemia in chronic kidney disease: Secondary | ICD-10-CM | POA: Diagnosis not present

## 2019-06-12 DIAGNOSIS — E039 Hypothyroidism, unspecified: Secondary | ICD-10-CM | POA: Diagnosis not present

## 2019-06-12 DIAGNOSIS — E876 Hypokalemia: Secondary | ICD-10-CM | POA: Diagnosis not present

## 2019-06-12 DIAGNOSIS — N2581 Secondary hyperparathyroidism of renal origin: Secondary | ICD-10-CM | POA: Diagnosis not present

## 2019-06-12 DIAGNOSIS — N186 End stage renal disease: Secondary | ICD-10-CM | POA: Diagnosis not present

## 2019-06-13 DIAGNOSIS — Z992 Dependence on renal dialysis: Secondary | ICD-10-CM | POA: Diagnosis not present

## 2019-06-13 DIAGNOSIS — N189 Chronic kidney disease, unspecified: Secondary | ICD-10-CM | POA: Diagnosis not present

## 2019-06-13 DIAGNOSIS — I129 Hypertensive chronic kidney disease with stage 1 through stage 4 chronic kidney disease, or unspecified chronic kidney disease: Secondary | ICD-10-CM | POA: Diagnosis not present

## 2019-06-14 DIAGNOSIS — D689 Coagulation defect, unspecified: Secondary | ICD-10-CM | POA: Diagnosis not present

## 2019-06-14 DIAGNOSIS — N2581 Secondary hyperparathyroidism of renal origin: Secondary | ICD-10-CM | POA: Diagnosis not present

## 2019-06-14 DIAGNOSIS — E876 Hypokalemia: Secondary | ICD-10-CM | POA: Diagnosis not present

## 2019-06-14 DIAGNOSIS — D631 Anemia in chronic kidney disease: Secondary | ICD-10-CM | POA: Diagnosis not present

## 2019-06-14 DIAGNOSIS — N186 End stage renal disease: Secondary | ICD-10-CM | POA: Diagnosis not present

## 2019-06-14 DIAGNOSIS — Z23 Encounter for immunization: Secondary | ICD-10-CM | POA: Diagnosis not present

## 2019-06-14 DIAGNOSIS — Z992 Dependence on renal dialysis: Secondary | ICD-10-CM | POA: Diagnosis not present

## 2019-06-17 DIAGNOSIS — D689 Coagulation defect, unspecified: Secondary | ICD-10-CM | POA: Diagnosis not present

## 2019-06-17 DIAGNOSIS — N186 End stage renal disease: Secondary | ICD-10-CM | POA: Diagnosis not present

## 2019-06-17 DIAGNOSIS — D631 Anemia in chronic kidney disease: Secondary | ICD-10-CM | POA: Diagnosis not present

## 2019-06-17 DIAGNOSIS — N2581 Secondary hyperparathyroidism of renal origin: Secondary | ICD-10-CM | POA: Diagnosis not present

## 2019-06-17 DIAGNOSIS — E876 Hypokalemia: Secondary | ICD-10-CM | POA: Diagnosis not present

## 2019-06-17 DIAGNOSIS — Z992 Dependence on renal dialysis: Secondary | ICD-10-CM | POA: Diagnosis not present

## 2019-06-17 DIAGNOSIS — Z23 Encounter for immunization: Secondary | ICD-10-CM | POA: Diagnosis not present

## 2019-06-18 ENCOUNTER — Other Ambulatory Visit: Payer: Self-pay | Admitting: Radiation Therapy

## 2019-06-18 ENCOUNTER — Ambulatory Visit
Admission: RE | Admit: 2019-06-18 | Discharge: 2019-06-18 | Disposition: A | Discharge status: Home / Self Care | Payer: Medicare Other | Source: Ambulatory Visit | Attending: Radiation Oncology | Admitting: Radiation Oncology

## 2019-06-18 ENCOUNTER — Other Ambulatory Visit: Payer: Self-pay

## 2019-06-18 DIAGNOSIS — C3411 Malignant neoplasm of upper lobe, right bronchus or lung: Secondary | ICD-10-CM

## 2019-06-18 DIAGNOSIS — Z510 Encounter for antineoplastic radiation therapy: Secondary | ICD-10-CM | POA: Insufficient documentation

## 2019-06-18 DIAGNOSIS — C3490 Malignant neoplasm of unspecified part of unspecified bronchus or lung: Secondary | ICD-10-CM

## 2019-06-18 DIAGNOSIS — C3412 Malignant neoplasm of upper lobe, left bronchus or lung: Secondary | ICD-10-CM | POA: Insufficient documentation

## 2019-06-18 NOTE — Progress Notes (Signed)
Radiation Oncology         [phone] ________________________________  Name: Jeremy Johnson MRN: [id number]  Date: 06/18/2019  DOB: 08/30/1946  Post Treatment Note  CC: [name], MD  [name], MD  Diagnosis:   73 y.o. male with limited stage small cell lung cancer of the left upper lung  Interval Since Last Radiation:  5 weeks  03/17/2019 - 04/14/2019, break in RT due to hospitalization, 04/28/2019 - 05/15/2019:  The left lung was treated to 59.4 Gy in 33 fractions of 1.8 Gy- Concurrent with systemic chemotherapy.   Narrative:  I spoke with the patient to conduct his routine scheduled 1 month follow up visit via telephone to spare the patient unnecessary potential exposure in the healthcare setting during the current COVID-19 pandemic.  The patient was notified in advance and gave permission to proceed with this visit format.   He tolerated radiation treatment relatively well.  He denied any chest pain, cough, shortness of breath, or difficulty swallowing and denied any skin changes within treatment field. He lost approximately 18 lbs during his two-week break from RT due to hospitalization related to acute renal failure. He was determined to have ESRD and was started on and has continued with dialysis every Tuesday, Thursday, and Saturday.                               On review of systems, the patient states that he is doing very well overall.  He does report a mild, dry intermittent cough particularly when he is wearing a facemask.  Otherwise, he denies productive cough, chest pain, increased shortness of breath, fever, chills or night sweats.  He reports a healthy appetite and is maintaining his weight.  He has completed chemotherapy and is currently on observation.  He had a recent follow-up chest CT on 05/30/2019 which showed a stable appearance of the previously treated right lung nodule without evidence of new disease or progression.  At his last follow-up visit with Dr. Earlie Server  on 06/02/2019, the decision was to continue in observation only with a repeat CT chest scan in approximately 3 months.  ALLERGIES:  is allergic to penicillins.  Meds: Current Outpatient Medications  Medication Sig Dispense Refill  . acetaminophen (TYLENOL) 325 MG tablet Take 2 tablets (650 mg total) by mouth every 6 (six) hours as needed for mild pain (or Fever >/= 101). 30 tablet 0  . allopurinol (ZYLOPRIM) 100 MG tablet Take 1 tablet (100 mg total) by mouth daily. 30 tablet 1  . Darbepoetin Alfa (ARANESP) 100 MCG/0.5ML SOSY injection Inject 0.5 mLs (100 mcg total) into the vein every Thursday with hemodialysis. 4.2 mL   . levothyroxine (SYNTHROID, LEVOTHROID) 175 MCG tablet Take 175 mcg by mouth daily before breakfast.    . metoprolol tartrate (LOPRESSOR) 50 MG tablet Take 25 mg by mouth 2 (two) times daily.     [name] omeprazole (PRILOSEC) 20 MG capsule Take 20 mg by mouth daily.    . polyethylene glycol (MIRALAX / GLYCOLAX) 17 g packet Take 17 g by mouth daily. 14 each 0  . simvastatin (ZOCOR) 20 MG tablet Take 20 mg by mouth at bedtime.     . sevelamer carbonate (RENVELA) 800 MG tablet Take 2 tablets (1,600 mg total) by mouth 3 (three) times daily with meals. (Patient not taking: Reported on 06/18/2019) 180 tablet 2  . sucralfate (CARAFATE) 1 g tablet Take 1 tablet (1 g  total) by mouth 4 (four) times daily -  with meals and at bedtime. 5 min before meals for radiation induced esophagitis (Patient not taking: Reported on 06/18/2019) 120 tablet 2   No current facility-administered medications for this encounter.     Physical Findings:  vitals were not taken for this visit.   /Unable to assess due to telephone follow-up visit format.  Lab Findings: Lab Results  Component Value Date   WBC 7.1 05/26/2019   HGB 10.1 (L) 05/26/2019   HCT 30.1 (L) 05/26/2019   MCV 89.9 05/26/2019   PLT 12 (L) 05/26/2019     Radiographic Findings: Ct Chest Wo Contrast  Result Date: 05/30/2019 CLINICAL DATA:   Small-cell lung cancer. EXAM: CT CHEST WITHOUT CONTRAST TECHNIQUE: Multidetector CT imaging of the chest was performed following the standard protocol without IV contrast. COMPARISON:  03/24/2019 FINDINGS: Cardiovascular: The heart size is normal. No substantial pericardial effusion. Coronary artery calcification is evident. Atherosclerotic calcification is noted in the wall of the thoracic aorta. The focal aneurysmal dilatation along the proximal transverse thoracic aorta is stable, measuring 4.7 cm diameter today. Right IJ central line tip projects in the upper right atrium. Mediastinum/Nodes: No mediastinal lymphadenopathy. No evidence for gross hilar lymphadenopathy although assessment is limited by the lack of intravenous contrast on today's study. The esophagus has normal imaging features. There is no axillary lymphadenopathy. Lungs/Pleura: Centrilobular emphsyema noted. 10 mm perifissural nodule in the right lung (109/10) is stable. No new suspicious pulmonary nodule or mass. No focal airspace consolidation. No pleural effusion. There is some minimal atelectasis in the posterior right costophrenic sulcus. Upper Abdomen: There is abdominal aortic atherosclerosis without aneurysm. Musculoskeletal: No worrisome lytic or sclerotic osseous abnormality.Supraumbilical ventral hernia has been incompletely visualized. IMPRESSION: 1. Stable exam.  No new or progressive interval findings. 2.  Aortic Atherosclerois (ICD10-170.0) 3.  Emphysema. ([id number]-J09.9) Electronically Signed   By: Misty Stanley M.D.   On: 05/30/2019 17:22   Dg Chest Portable 1 View  Result Date: 05/22/2019 CLINICAL DATA:  Shortness of breath. EXAM: PORTABLE CHEST 1 VIEW COMPARISON:  04/14/2019. FINDINGS: Normal sized heart. Tortuous aorta. Clear lungs with stable biapical pleural and parenchymal scarring. The lungs are mildly hyperexpanded. Interval right jugular catheter with its tip at the superior cavoatrial junction. No pneumothorax. Thoracic  spine degenerative changes. IMPRESSION: No acute abnormality. Mild changes of COPD. Electronically Signed   By: Claudie Revering M.D.   On: 05/22/2019 15:49   Vas US Duplex Dialysis Access (avf,avg)  Result Date: 06/10/2019 DIALYSIS ACCESS Reason for Exam: Routine follow up. Access Site: Left Upper Extremity. Access Type: Conversion to left arm AVGG. History: Previous left brachiocephalic fistula failed and basilic vein was not          usable. Patient complains of mild itching, redness, and edema in the          left upper extremity. Performing Technologist: Delorise Shiner RVT  Examination Guidelines: A complete evaluation includes B-mode imaging, spectral Doppler, color Doppler, and power Doppler as needed of all accessible portions of each vessel. Unilateral testing is considered an integral part of a complete examination. Limited examinations for reoccurring indications may be performed as noted.  Findings:   +--------------------+----------+-----------------+--------+ AVG                 PSV (cm/s)Flow Vol (mL/min)Describe +--------------------+----------+-----------------+--------+ Native artery inflow   339                              +--------------------+----------+-----------------+--------+  Arterial anastomosis   664                              +--------------------+----------+-----------------+--------+ Prox graft             787                              +--------------------+----------+-----------------+--------+ Mid graft              240                              +--------------------+----------+-----------------+--------+ Distal graft           341                              +--------------------+----------+-----------------+--------+ Venous anastomosis     270                              +--------------------+----------+-----------------+--------+ Venous outflow         334                               +--------------------+----------+-----------------+--------+ Left subclavian vein was evaluated from anastomosis to proximal to the clavicle. No obvious outflow stenosis identified.  Summary: Patent arteriovenous graft. *See table(s) above for measurements and observations.  Diagnosing physician: Curt Jews MD Electronically signed by Curt Jews MD on 06/10/2019 at 12:55:09 PM.   --------------------------------------------------------------------------------   Final     Impression/Plan: 1. 73 y.o. male with limited stage small cell lung cancer of the left upper lung. He appears to be recovering well from the effects of his recent lung radiation.  The plan is to continue in observation at this point with follow-up restaging CT chest scan in approximately 3 months under the care and direction of Dr. Earlie Server. 2. PCI.  We again discussed the role of prophylactic cranial irradiation in the management of limited stage small cell lung cancer to reduce the risk of brain metastasis.  We discussed the risks and benefits as well as the long-term and short-term side effects associated with this treatment.  We discussed the need to repeat an MRI brain to confirm that he remains disease-free in the brain prior to proceeding with this treatment.  He is in agreement and we will schedule this within the next 1 to 2 weeks with plans to meet back together again to discuss these results and confirm his decision to proceed with PCI.    Nicholos Johns, PA-C

## 2019-06-19 DIAGNOSIS — N2581 Secondary hyperparathyroidism of renal origin: Secondary | ICD-10-CM | POA: Diagnosis not present

## 2019-06-19 DIAGNOSIS — E876 Hypokalemia: Secondary | ICD-10-CM | POA: Diagnosis not present

## 2019-06-19 DIAGNOSIS — D631 Anemia in chronic kidney disease: Secondary | ICD-10-CM | POA: Diagnosis not present

## 2019-06-19 DIAGNOSIS — Z992 Dependence on renal dialysis: Secondary | ICD-10-CM | POA: Diagnosis not present

## 2019-06-19 DIAGNOSIS — Z23 Encounter for immunization: Secondary | ICD-10-CM | POA: Diagnosis not present

## 2019-06-19 DIAGNOSIS — D689 Coagulation defect, unspecified: Secondary | ICD-10-CM | POA: Diagnosis not present

## 2019-06-19 DIAGNOSIS — N186 End stage renal disease: Secondary | ICD-10-CM | POA: Diagnosis not present

## 2019-06-21 DIAGNOSIS — D689 Coagulation defect, unspecified: Secondary | ICD-10-CM | POA: Diagnosis not present

## 2019-06-21 DIAGNOSIS — N2581 Secondary hyperparathyroidism of renal origin: Secondary | ICD-10-CM | POA: Diagnosis not present

## 2019-06-21 DIAGNOSIS — D631 Anemia in chronic kidney disease: Secondary | ICD-10-CM | POA: Diagnosis not present

## 2019-06-21 DIAGNOSIS — E876 Hypokalemia: Secondary | ICD-10-CM | POA: Diagnosis not present

## 2019-06-21 DIAGNOSIS — N186 End stage renal disease: Secondary | ICD-10-CM | POA: Diagnosis not present

## 2019-06-21 DIAGNOSIS — Z992 Dependence on renal dialysis: Secondary | ICD-10-CM | POA: Diagnosis not present

## 2019-06-21 DIAGNOSIS — Z23 Encounter for immunization: Secondary | ICD-10-CM | POA: Diagnosis not present

## 2019-06-23 ENCOUNTER — Telehealth: Payer: Self-pay | Admitting: Radiation Oncology

## 2019-06-23 NOTE — Telephone Encounter (Signed)
Phoned patient back. Explained that I received his message and forwarded his concerns about contrast onto [employer], PA-C. Patient understands this RN will phone back with her response.

## 2019-06-24 DIAGNOSIS — E876 Hypokalemia: Secondary | ICD-10-CM | POA: Diagnosis not present

## 2019-06-24 DIAGNOSIS — D689 Coagulation defect, unspecified: Secondary | ICD-10-CM | POA: Diagnosis not present

## 2019-06-24 DIAGNOSIS — N186 End stage renal disease: Secondary | ICD-10-CM | POA: Diagnosis not present

## 2019-06-24 DIAGNOSIS — D631 Anemia in chronic kidney disease: Secondary | ICD-10-CM | POA: Diagnosis not present

## 2019-06-24 DIAGNOSIS — Z992 Dependence on renal dialysis: Secondary | ICD-10-CM | POA: Diagnosis not present

## 2019-06-24 DIAGNOSIS — Z23 Encounter for immunization: Secondary | ICD-10-CM | POA: Diagnosis not present

## 2019-06-24 DIAGNOSIS — N2581 Secondary hyperparathyroidism of renal origin: Secondary | ICD-10-CM | POA: Diagnosis not present

## 2019-06-26 DIAGNOSIS — E876 Hypokalemia: Secondary | ICD-10-CM | POA: Diagnosis not present

## 2019-06-26 DIAGNOSIS — Z23 Encounter for immunization: Secondary | ICD-10-CM | POA: Diagnosis not present

## 2019-06-26 DIAGNOSIS — N2581 Secondary hyperparathyroidism of renal origin: Secondary | ICD-10-CM | POA: Diagnosis not present

## 2019-06-26 DIAGNOSIS — D631 Anemia in chronic kidney disease: Secondary | ICD-10-CM | POA: Diagnosis not present

## 2019-06-26 DIAGNOSIS — N186 End stage renal disease: Secondary | ICD-10-CM | POA: Diagnosis not present

## 2019-06-26 DIAGNOSIS — D689 Coagulation defect, unspecified: Secondary | ICD-10-CM | POA: Diagnosis not present

## 2019-06-26 DIAGNOSIS — Z992 Dependence on renal dialysis: Secondary | ICD-10-CM | POA: Diagnosis not present

## 2019-06-28 DIAGNOSIS — N186 End stage renal disease: Secondary | ICD-10-CM | POA: Diagnosis not present

## 2019-06-28 DIAGNOSIS — N2581 Secondary hyperparathyroidism of renal origin: Secondary | ICD-10-CM | POA: Diagnosis not present

## 2019-06-28 DIAGNOSIS — Z23 Encounter for immunization: Secondary | ICD-10-CM | POA: Diagnosis not present

## 2019-06-28 DIAGNOSIS — Z992 Dependence on renal dialysis: Secondary | ICD-10-CM | POA: Diagnosis not present

## 2019-06-28 DIAGNOSIS — E876 Hypokalemia: Secondary | ICD-10-CM | POA: Diagnosis not present

## 2019-06-28 DIAGNOSIS — D689 Coagulation defect, unspecified: Secondary | ICD-10-CM | POA: Diagnosis not present

## 2019-06-28 DIAGNOSIS — D631 Anemia in chronic kidney disease: Secondary | ICD-10-CM | POA: Diagnosis not present

## 2019-06-30 ENCOUNTER — Ambulatory Visit (HOSPITAL_COMMUNITY)
Admission: RE | Admit: 2019-06-30 | Discharge: 2019-06-30 | Disposition: A | Discharge status: Home / Self Care | Payer: Medicare Other | Source: Ambulatory Visit | Attending: Radiation Oncology | Admitting: Radiation Oncology

## 2019-06-30 ENCOUNTER — Other Ambulatory Visit: Payer: Self-pay

## 2019-06-30 DIAGNOSIS — J3489 Other specified disorders of nose and nasal sinuses: Secondary | ICD-10-CM | POA: Diagnosis not present

## 2019-06-30 DIAGNOSIS — M47812 Spondylosis without myelopathy or radiculopathy, cervical region: Secondary | ICD-10-CM | POA: Diagnosis not present

## 2019-06-30 DIAGNOSIS — C3490 Malignant neoplasm of unspecified part of unspecified bronchus or lung: Secondary | ICD-10-CM | POA: Insufficient documentation

## 2019-06-30 DIAGNOSIS — I6782 Cerebral ischemia: Secondary | ICD-10-CM | POA: Diagnosis not present

## 2019-06-30 DIAGNOSIS — G319 Degenerative disease of nervous system, unspecified: Secondary | ICD-10-CM | POA: Diagnosis not present

## 2019-07-01 ENCOUNTER — Encounter: Payer: Self-pay | Admitting: Urology

## 2019-07-01 ENCOUNTER — Ambulatory Visit
Admission: RE | Admit: 2019-07-01 | Discharge: 2019-07-01 | Disposition: A | Discharge status: Home / Self Care | Payer: Medicare Other | Source: Ambulatory Visit | Attending: Urology | Admitting: Urology

## 2019-07-01 ENCOUNTER — Other Ambulatory Visit: Payer: Self-pay

## 2019-07-01 DIAGNOSIS — E876 Hypokalemia: Secondary | ICD-10-CM | POA: Diagnosis not present

## 2019-07-01 DIAGNOSIS — N186 End stage renal disease: Secondary | ICD-10-CM | POA: Diagnosis not present

## 2019-07-01 DIAGNOSIS — Z992 Dependence on renal dialysis: Secondary | ICD-10-CM | POA: Diagnosis not present

## 2019-07-01 DIAGNOSIS — N2581 Secondary hyperparathyroidism of renal origin: Secondary | ICD-10-CM | POA: Diagnosis not present

## 2019-07-01 DIAGNOSIS — Z23 Encounter for immunization: Secondary | ICD-10-CM | POA: Diagnosis not present

## 2019-07-01 DIAGNOSIS — D631 Anemia in chronic kidney disease: Secondary | ICD-10-CM | POA: Diagnosis not present

## 2019-07-01 DIAGNOSIS — C3411 Malignant neoplasm of upper lobe, right bronchus or lung: Secondary | ICD-10-CM

## 2019-07-01 DIAGNOSIS — D689 Coagulation defect, unspecified: Secondary | ICD-10-CM | POA: Diagnosis not present

## 2019-07-01 NOTE — Progress Notes (Signed)
Patient for My chart visit no complaints at this time

## 2019-07-01 NOTE — Progress Notes (Signed)
Radiation Oncology         [phone]) [phone] ________________________________  Name: Jeremy Johnson MRN: [id number]  Date: 07/01/2019  DOB: 1946/05/23  Follow up Re-Evaluation Note  CC: [name], MD  [name], MD  Diagnosis:   73 y.o. male with limited stage small cell lung cancer of the left upper lung  Interval Since Last Radiation:  6.5 weeks  03/17/2019 - 04/14/2019, break in RT due to hospitalization, 04/28/2019 - 05/15/2019:  The left lung was treated to 59.4 Gy in 33 fractions of 1.8 Gy- Concurrent with systemic chemotherapy.  Narrative:  Jeremy Johnson was contacted today via [place] virtual visit to avoid unnecessary exposure in the healthcare setting during the current COVID-19 pandemic.  We discussed the results of his recent brain MRI performed 06/30/19 which fortunately remains without evidence of metastasis, though this exam is somewhat limited due to lack of contrast as his nephrologist preferred that he not have contrast exam  In light of his ESRD, on dialysis every Tuesday, Thursday, and Saturday currently. We reviewed the recommendation for prophylactic cranial irradiation (PCI) to reduce the risk of potential brain metastasis in the future.  We discussed the risks, benefits, short and long term sequela associated with PCI.  We discussed the logistics of delivery as it relates to the recommended 2 week course of PCI delivered daily in 10 fractions.  He was encouraged to ask questions that were answered to his stated satisfaction.   He has completed chemotherapy and is currently on observation. He had a recent follow-up chest CT on 05/30/2019 which showed a stable appearance of the previously treated left upper lung nodule without evidence of new disease or progression.  At his last follow-up visit with Dr. Earlie Server on 06/02/2019, the decision was to continue in observation only with repeat chest CT on 09/01/2019 for disease restaging.   ALLERGIES:  is allergic to  penicillins.  Meds: Current Outpatient Medications  Medication Sig Dispense Refill   acetaminophen (TYLENOL) 325 MG tablet Take 2 tablets (650 mg total) by mouth every 6 (six) hours as needed for mild pain (or Fever >/= 101). 30 tablet 0   allopurinol (ZYLOPRIM) 100 MG tablet Take 1 tablet (100 mg total) by mouth daily. 30 tablet 1   Darbepoetin Alfa (ARANESP) 100 MCG/0.5ML SOSY injection Inject 0.5 mLs (100 mcg total) into the vein every Thursday with hemodialysis. 4.2 mL    levothyroxine (SYNTHROID, LEVOTHROID) 175 MCG tablet Take 175 mcg by mouth daily before breakfast.     metoprolol tartrate (LOPRESSOR) 50 MG tablet Take 25 mg by mouth 2 (two) times daily.      omeprazole (PRILOSEC) 20 MG capsule Take 20 mg by mouth daily.     polyethylene glycol (MIRALAX / GLYCOLAX) 17 g packet Take 17 g by mouth daily. 14 each 0   sevelamer carbonate (RENVELA) 800 MG tablet Take 2 tablets (1,600 mg total) by mouth 3 (three) times daily with meals. (Patient not taking: Reported on 06/18/2019) 180 tablet 2   simvastatin (ZOCOR) 20 MG tablet Take 20 mg by mouth at bedtime.      sucralfate (CARAFATE) 1 g tablet Take 1 tablet (1 g total) by mouth 4 (four) times daily -  with meals and at bedtime. 5 min before meals for radiation induced esophagitis (Patient not taking: Reported on 06/18/2019) 120 tablet 2   No current facility-administered medications for this encounter.     Physical Findings:  vitals were not taken for this visit.    In  general this is a well appearing Caucasian male in no acute distress. He's alert and oriented x4 and appropriate throughout the examination. Cardiopulmonary assessment is negative for acute distress and he exhibits normal effort.    Lab Findings: Lab Results  Component Value Date   WBC 7.1 05/26/2019   HGB 10.1 (L) 05/26/2019   HCT 30.1 (L) 05/26/2019   MCV 89.9 05/26/2019   PLT 12 (L) 05/26/2019     Radiographic Findings: Vas US Duplex Dialysis Access  (avf,avg)  Result Date: 06/10/2019 DIALYSIS ACCESS Reason for Exam: Routine follow up. Access Site: Left Upper Extremity. Access Type: Conversion to left arm AVGG. History: Previous left brachiocephalic fistula failed and basilic vein was not          usable. Patient complains of mild itching, redness, and edema in the          left upper extremity. Performing Technologist: Delorise Shiner RVT  Examination Guidelines: A complete evaluation includes B-mode imaging, spectral Doppler, color Doppler, and power Doppler as needed of all accessible portions of each vessel. Unilateral testing is considered an integral part of a complete examination. Limited examinations for reoccurring indications may be performed as noted.  Findings:   +--------------------+----------+-----------------+--------+  AVG                  PSV (cm/s) Flow Vol (mL/min) Describe  +--------------------+----------+-----------------+--------+  Native artery inflow    339                                 +--------------------+----------+-----------------+--------+  Arterial anastomosis    664                                 +--------------------+----------+-----------------+--------+  Prox graft              787                                 +--------------------+----------+-----------------+--------+  Mid graft               240                                 +--------------------+----------+-----------------+--------+  Distal graft            341                                 +--------------------+----------+-----------------+--------+  Venous anastomosis      270                                 +--------------------+----------+-----------------+--------+  Venous outflow          334                                 +--------------------+----------+-----------------+--------+ Left subclavian vein was evaluated from anastomosis to proximal to the clavicle. No obvious outflow stenosis identified.  Summary: Patent arteriovenous graft. *See table(s)  above for measurements and observations.  Diagnosing physician: Curt Jews MD Electronically signed by Curt Jews MD on 06/10/2019 at 12:55:09  PM.   --------------------------------------------------------------------------------   Final     Impression/Plan: 1. 73 y.o. male with limited stage small cell lung cancer of the left upper lung. He continues to recover well from the effects of his recent lung radiation.  He is scheduled for repeat chest CT on 09/01/2019 and for follow up with Dr. Julien Nordmann on 09/04/2019. 2. PCI.   We reviewed his recent brain MRI, which confirmed that he remains without convincing evidence of any intraparenchymal metastasis. We discussed the available radiation techniques, and focused on the details of logistics and delivery related to PCI. We reviewed the anticipated acute and late sequelae associated with radiation in this setting. The patient was encouraged to ask questions that were answered to his satisfaction.  At the conclusion of our conversation, the patient elects to proceed with PCI which will be delivered over the course of 10 daily treatments in a 2 week period of time.  He appears to have a good understanding of these recommendations and is in agreement to proceed.  He is scheduled for CT simulation on Friday, 07/04/2019, at 9 am in anticipation of beginning his treatments in the near future.  Given current concerns for patient exposure during the COVID-19 pandemic, this encounter was conducted via MyChart virtual visit. The patient was notified in advance and gave permission to proceed with a visit of this type. The patient has given verbal consent for this type of encounter. The time spent during this encounter was 25 minutes.   Nicholos Johns, PA-C    Tyler Pita, MD  [facility] Direct Dial: [phone]   Fax: [phone] [email or web address]   Skype   [email or web address]   This document serves as a record of services personally performed  by Tyler Pita, MD and Freeman Caldron, PA-C. It was created on their behalf by Wilburn Mylar, a trained medical scribe. The creation of this record is based on the scribe's personal observations and the provider's statements to them. This document has been checked and approved by the attending provider.

## 2019-07-03 DIAGNOSIS — D689 Coagulation defect, unspecified: Secondary | ICD-10-CM | POA: Diagnosis not present

## 2019-07-03 DIAGNOSIS — N186 End stage renal disease: Secondary | ICD-10-CM | POA: Diagnosis not present

## 2019-07-03 DIAGNOSIS — D631 Anemia in chronic kidney disease: Secondary | ICD-10-CM | POA: Diagnosis not present

## 2019-07-03 DIAGNOSIS — Z992 Dependence on renal dialysis: Secondary | ICD-10-CM | POA: Diagnosis not present

## 2019-07-03 DIAGNOSIS — E876 Hypokalemia: Secondary | ICD-10-CM | POA: Diagnosis not present

## 2019-07-03 DIAGNOSIS — N2581 Secondary hyperparathyroidism of renal origin: Secondary | ICD-10-CM | POA: Diagnosis not present

## 2019-07-03 DIAGNOSIS — Z23 Encounter for immunization: Secondary | ICD-10-CM | POA: Diagnosis not present

## 2019-07-04 ENCOUNTER — Other Ambulatory Visit: Payer: Self-pay

## 2019-07-04 ENCOUNTER — Ambulatory Visit
Admission: RE | Admit: 2019-07-04 | Discharge: 2019-07-04 | Disposition: A | Discharge status: Home / Self Care | Payer: Medicare Other | Source: Ambulatory Visit | Attending: Radiation Oncology | Admitting: Radiation Oncology

## 2019-07-04 DIAGNOSIS — C3412 Malignant neoplasm of upper lobe, left bronchus or lung: Secondary | ICD-10-CM | POA: Diagnosis not present

## 2019-07-04 DIAGNOSIS — C3411 Malignant neoplasm of upper lobe, right bronchus or lung: Secondary | ICD-10-CM

## 2019-07-04 DIAGNOSIS — Z298 Encounter for other specified prophylactic measures: Secondary | ICD-10-CM | POA: Diagnosis not present

## 2019-07-04 DIAGNOSIS — C3490 Malignant neoplasm of unspecified part of unspecified bronchus or lung: Secondary | ICD-10-CM

## 2019-07-04 DIAGNOSIS — Z510 Encounter for antineoplastic radiation therapy: Secondary | ICD-10-CM | POA: Diagnosis not present

## 2019-07-04 NOTE — Progress Notes (Signed)
  Radiation Oncology         [phone] ________________________________  Name: Jeremy Johnson MRN: [id number]  Date: 07/04/2019  DOB: 1946-02-18  SIMULATION AND TREATMENT PLANNING NOTE    ICD-10-CM   1. Small cell lung cancer (HCC)  C34.90   2. Small cell lung cancer, right upper lobe (HCC)  C34.11     DIAGNOSIS:  72 y.o.male withlimited stage small cell lung cancer of the left upper lung.  NARRATIVE:  The patient was brought to the [facility].  Identity was confirmed.  All relevant records and images related to the planned course of therapy were reviewed.  The patient freely provided informed written consent to proceed with treatment after reviewing the details related to the planned course of therapy. The consent form was witnessed and verified by the simulation staff.  Then, the patient was set-up in a stable reproducible  supine position for radiation therapy.  CT images were obtained.  Surface markings were placed.  The CT images were loaded into the planning software.  Then the target and avoidance structures were contoured.  Treatment planning then occurred.  The radiation prescription was entered and confirmed.  Then, I designed and supervised the construction of a total of 3 medically necessary complex treatment devices, including a custom made thermoplastic mask used for immobilization and two complex multileaf collimators to cover the entire intracranial contents, while shielding the eyes and face.  Each [facility] is independently created to account for beam divergence.  The right and left lateral fields will be treated with 6 MV X-rays.  I have requested : Isodose Plan.    PLAN:  The whole brain will be treated to 25 Gy in 10 fractions.  ________________________________  Sheral Apley Tammi Klippel, M.D.

## 2019-07-05 DIAGNOSIS — N2581 Secondary hyperparathyroidism of renal origin: Secondary | ICD-10-CM | POA: Diagnosis not present

## 2019-07-05 DIAGNOSIS — E876 Hypokalemia: Secondary | ICD-10-CM | POA: Diagnosis not present

## 2019-07-05 DIAGNOSIS — D631 Anemia in chronic kidney disease: Secondary | ICD-10-CM | POA: Diagnosis not present

## 2019-07-05 DIAGNOSIS — N186 End stage renal disease: Secondary | ICD-10-CM | POA: Diagnosis not present

## 2019-07-05 DIAGNOSIS — Z992 Dependence on renal dialysis: Secondary | ICD-10-CM | POA: Diagnosis not present

## 2019-07-05 DIAGNOSIS — D689 Coagulation defect, unspecified: Secondary | ICD-10-CM | POA: Diagnosis not present

## 2019-07-05 DIAGNOSIS — Z23 Encounter for immunization: Secondary | ICD-10-CM | POA: Diagnosis not present

## 2019-07-07 DIAGNOSIS — Z510 Encounter for antineoplastic radiation therapy: Secondary | ICD-10-CM | POA: Diagnosis not present

## 2019-07-07 DIAGNOSIS — C3412 Malignant neoplasm of upper lobe, left bronchus or lung: Secondary | ICD-10-CM | POA: Diagnosis not present

## 2019-07-07 DIAGNOSIS — Z298 Encounter for other specified prophylactic measures: Secondary | ICD-10-CM | POA: Diagnosis not present

## 2019-07-08 DIAGNOSIS — Z23 Encounter for immunization: Secondary | ICD-10-CM | POA: Diagnosis not present

## 2019-07-08 DIAGNOSIS — D631 Anemia in chronic kidney disease: Secondary | ICD-10-CM | POA: Diagnosis not present

## 2019-07-08 DIAGNOSIS — Z992 Dependence on renal dialysis: Secondary | ICD-10-CM | POA: Diagnosis not present

## 2019-07-08 DIAGNOSIS — N2581 Secondary hyperparathyroidism of renal origin: Secondary | ICD-10-CM | POA: Diagnosis not present

## 2019-07-08 DIAGNOSIS — E876 Hypokalemia: Secondary | ICD-10-CM | POA: Diagnosis not present

## 2019-07-08 DIAGNOSIS — N186 End stage renal disease: Secondary | ICD-10-CM | POA: Diagnosis not present

## 2019-07-08 DIAGNOSIS — D689 Coagulation defect, unspecified: Secondary | ICD-10-CM | POA: Diagnosis not present

## 2019-07-10 DIAGNOSIS — D689 Coagulation defect, unspecified: Secondary | ICD-10-CM | POA: Diagnosis not present

## 2019-07-10 DIAGNOSIS — Z992 Dependence on renal dialysis: Secondary | ICD-10-CM | POA: Diagnosis not present

## 2019-07-10 DIAGNOSIS — N2581 Secondary hyperparathyroidism of renal origin: Secondary | ICD-10-CM | POA: Diagnosis not present

## 2019-07-10 DIAGNOSIS — D631 Anemia in chronic kidney disease: Secondary | ICD-10-CM | POA: Diagnosis not present

## 2019-07-10 DIAGNOSIS — E876 Hypokalemia: Secondary | ICD-10-CM | POA: Diagnosis not present

## 2019-07-10 DIAGNOSIS — N186 End stage renal disease: Secondary | ICD-10-CM | POA: Diagnosis not present

## 2019-07-10 DIAGNOSIS — Z23 Encounter for immunization: Secondary | ICD-10-CM | POA: Diagnosis not present

## 2019-07-12 DIAGNOSIS — Z992 Dependence on renal dialysis: Secondary | ICD-10-CM | POA: Diagnosis not present

## 2019-07-12 DIAGNOSIS — N186 End stage renal disease: Secondary | ICD-10-CM | POA: Diagnosis not present

## 2019-07-12 DIAGNOSIS — Z23 Encounter for immunization: Secondary | ICD-10-CM | POA: Diagnosis not present

## 2019-07-12 DIAGNOSIS — N2581 Secondary hyperparathyroidism of renal origin: Secondary | ICD-10-CM | POA: Diagnosis not present

## 2019-07-12 DIAGNOSIS — D631 Anemia in chronic kidney disease: Secondary | ICD-10-CM | POA: Diagnosis not present

## 2019-07-12 DIAGNOSIS — E876 Hypokalemia: Secondary | ICD-10-CM | POA: Diagnosis not present

## 2019-07-12 DIAGNOSIS — D689 Coagulation defect, unspecified: Secondary | ICD-10-CM | POA: Diagnosis not present

## 2019-07-14 ENCOUNTER — Other Ambulatory Visit: Payer: Self-pay

## 2019-07-14 ENCOUNTER — Ambulatory Visit
Admission: RE | Admit: 2019-07-14 | Discharge: 2019-07-14 | Disposition: A | Discharge status: Home / Self Care | Payer: Medicare Other | Source: Ambulatory Visit | Attending: Radiation Oncology | Admitting: Radiation Oncology

## 2019-07-14 DIAGNOSIS — N189 Chronic kidney disease, unspecified: Secondary | ICD-10-CM | POA: Diagnosis not present

## 2019-07-14 DIAGNOSIS — Z298 Encounter for other specified prophylactic measures: Secondary | ICD-10-CM | POA: Diagnosis not present

## 2019-07-14 DIAGNOSIS — Z992 Dependence on renal dialysis: Secondary | ICD-10-CM | POA: Diagnosis not present

## 2019-07-14 DIAGNOSIS — Z510 Encounter for antineoplastic radiation therapy: Secondary | ICD-10-CM | POA: Diagnosis not present

## 2019-07-14 DIAGNOSIS — I129 Hypertensive chronic kidney disease with stage 1 through stage 4 chronic kidney disease, or unspecified chronic kidney disease: Secondary | ICD-10-CM | POA: Diagnosis not present

## 2019-07-14 DIAGNOSIS — C3412 Malignant neoplasm of upper lobe, left bronchus or lung: Secondary | ICD-10-CM | POA: Diagnosis not present

## 2019-07-15 ENCOUNTER — Other Ambulatory Visit: Payer: Self-pay

## 2019-07-15 ENCOUNTER — Ambulatory Visit
Admission: RE | Admit: 2019-07-15 | Discharge: 2019-07-15 | Disposition: A | Discharge status: Home / Self Care | Payer: Medicare Other | Source: Ambulatory Visit | Attending: Radiation Oncology | Admitting: Radiation Oncology

## 2019-07-15 DIAGNOSIS — D631 Anemia in chronic kidney disease: Secondary | ICD-10-CM | POA: Diagnosis not present

## 2019-07-15 DIAGNOSIS — Z510 Encounter for antineoplastic radiation therapy: Secondary | ICD-10-CM | POA: Diagnosis not present

## 2019-07-15 DIAGNOSIS — Z992 Dependence on renal dialysis: Secondary | ICD-10-CM | POA: Diagnosis not present

## 2019-07-15 DIAGNOSIS — Z23 Encounter for immunization: Secondary | ICD-10-CM | POA: Diagnosis not present

## 2019-07-15 DIAGNOSIS — N186 End stage renal disease: Secondary | ICD-10-CM | POA: Diagnosis not present

## 2019-07-15 DIAGNOSIS — C3412 Malignant neoplasm of upper lobe, left bronchus or lung: Secondary | ICD-10-CM | POA: Insufficient documentation

## 2019-07-15 DIAGNOSIS — E876 Hypokalemia: Secondary | ICD-10-CM | POA: Diagnosis not present

## 2019-07-15 DIAGNOSIS — N2581 Secondary hyperparathyroidism of renal origin: Secondary | ICD-10-CM | POA: Diagnosis not present

## 2019-07-15 DIAGNOSIS — D689 Coagulation defect, unspecified: Secondary | ICD-10-CM | POA: Diagnosis not present

## 2019-07-16 ENCOUNTER — Ambulatory Visit
Admission: RE | Admit: 2019-07-16 | Discharge: 2019-07-16 | Disposition: A | Discharge status: Home / Self Care | Payer: Medicare Other | Source: Ambulatory Visit | Attending: Radiation Oncology | Admitting: Radiation Oncology

## 2019-07-16 ENCOUNTER — Other Ambulatory Visit: Payer: Self-pay

## 2019-07-16 DIAGNOSIS — Z510 Encounter for antineoplastic radiation therapy: Secondary | ICD-10-CM | POA: Diagnosis not present

## 2019-07-16 DIAGNOSIS — C3412 Malignant neoplasm of upper lobe, left bronchus or lung: Secondary | ICD-10-CM | POA: Diagnosis not present

## 2019-07-17 ENCOUNTER — Ambulatory Visit
Admission: RE | Admit: 2019-07-17 | Discharge: 2019-07-17 | Disposition: A | Discharge status: Home / Self Care | Payer: Medicare Other | Source: Ambulatory Visit | Attending: Radiation Oncology | Admitting: Radiation Oncology

## 2019-07-17 ENCOUNTER — Other Ambulatory Visit: Payer: Self-pay

## 2019-07-17 DIAGNOSIS — Z23 Encounter for immunization: Secondary | ICD-10-CM | POA: Diagnosis not present

## 2019-07-17 DIAGNOSIS — C3490 Malignant neoplasm of unspecified part of unspecified bronchus or lung: Secondary | ICD-10-CM | POA: Diagnosis not present

## 2019-07-17 DIAGNOSIS — D689 Coagulation defect, unspecified: Secondary | ICD-10-CM | POA: Diagnosis not present

## 2019-07-17 DIAGNOSIS — Z992 Dependence on renal dialysis: Secondary | ICD-10-CM | POA: Diagnosis not present

## 2019-07-17 DIAGNOSIS — I10 Essential (primary) hypertension: Secondary | ICD-10-CM | POA: Diagnosis not present

## 2019-07-17 DIAGNOSIS — Z510 Encounter for antineoplastic radiation therapy: Secondary | ICD-10-CM | POA: Diagnosis not present

## 2019-07-17 DIAGNOSIS — N2581 Secondary hyperparathyroidism of renal origin: Secondary | ICD-10-CM | POA: Diagnosis not present

## 2019-07-17 DIAGNOSIS — N186 End stage renal disease: Secondary | ICD-10-CM | POA: Diagnosis not present

## 2019-07-17 DIAGNOSIS — D631 Anemia in chronic kidney disease: Secondary | ICD-10-CM | POA: Diagnosis not present

## 2019-07-17 DIAGNOSIS — C3412 Malignant neoplasm of upper lobe, left bronchus or lung: Secondary | ICD-10-CM | POA: Diagnosis not present

## 2019-07-17 DIAGNOSIS — E876 Hypokalemia: Secondary | ICD-10-CM | POA: Diagnosis not present

## 2019-07-18 ENCOUNTER — Other Ambulatory Visit: Payer: Self-pay

## 2019-07-18 ENCOUNTER — Ambulatory Visit
Admission: RE | Admit: 2019-07-18 | Discharge: 2019-07-18 | Disposition: A | Discharge status: Home / Self Care | Payer: Medicare Other | Source: Ambulatory Visit | Attending: Radiation Oncology | Admitting: Radiation Oncology

## 2019-07-18 DIAGNOSIS — Z298 Encounter for other specified prophylactic measures: Secondary | ICD-10-CM | POA: Diagnosis not present

## 2019-07-18 DIAGNOSIS — C3412 Malignant neoplasm of upper lobe, left bronchus or lung: Secondary | ICD-10-CM | POA: Diagnosis not present

## 2019-07-18 DIAGNOSIS — Z452 Encounter for adjustment and management of vascular access device: Secondary | ICD-10-CM | POA: Diagnosis not present

## 2019-07-18 DIAGNOSIS — Z510 Encounter for antineoplastic radiation therapy: Secondary | ICD-10-CM | POA: Diagnosis not present

## 2019-07-19 DIAGNOSIS — N2581 Secondary hyperparathyroidism of renal origin: Secondary | ICD-10-CM | POA: Diagnosis not present

## 2019-07-19 DIAGNOSIS — Z992 Dependence on renal dialysis: Secondary | ICD-10-CM | POA: Diagnosis not present

## 2019-07-19 DIAGNOSIS — Z23 Encounter for immunization: Secondary | ICD-10-CM | POA: Diagnosis not present

## 2019-07-19 DIAGNOSIS — N186 End stage renal disease: Secondary | ICD-10-CM | POA: Diagnosis not present

## 2019-07-19 DIAGNOSIS — E876 Hypokalemia: Secondary | ICD-10-CM | POA: Diagnosis not present

## 2019-07-19 DIAGNOSIS — D689 Coagulation defect, unspecified: Secondary | ICD-10-CM | POA: Diagnosis not present

## 2019-07-19 DIAGNOSIS — D631 Anemia in chronic kidney disease: Secondary | ICD-10-CM | POA: Diagnosis not present

## 2019-07-21 DIAGNOSIS — D689 Coagulation defect, unspecified: Secondary | ICD-10-CM | POA: Diagnosis not present

## 2019-07-21 DIAGNOSIS — E876 Hypokalemia: Secondary | ICD-10-CM | POA: Diagnosis not present

## 2019-07-21 DIAGNOSIS — Z992 Dependence on renal dialysis: Secondary | ICD-10-CM | POA: Diagnosis not present

## 2019-07-21 DIAGNOSIS — N186 End stage renal disease: Secondary | ICD-10-CM | POA: Diagnosis not present

## 2019-07-21 DIAGNOSIS — D631 Anemia in chronic kidney disease: Secondary | ICD-10-CM | POA: Diagnosis not present

## 2019-07-21 DIAGNOSIS — Z23 Encounter for immunization: Secondary | ICD-10-CM | POA: Diagnosis not present

## 2019-07-21 DIAGNOSIS — N2581 Secondary hyperparathyroidism of renal origin: Secondary | ICD-10-CM | POA: Diagnosis not present

## 2019-07-22 ENCOUNTER — Other Ambulatory Visit: Payer: Self-pay

## 2019-07-22 ENCOUNTER — Ambulatory Visit
Admission: RE | Admit: 2019-07-22 | Discharge: 2019-07-22 | Disposition: A | Discharge status: Home / Self Care | Payer: Medicare Other | Source: Ambulatory Visit | Attending: Radiation Oncology | Admitting: Radiation Oncology

## 2019-07-22 DIAGNOSIS — N2581 Secondary hyperparathyroidism of renal origin: Secondary | ICD-10-CM | POA: Diagnosis not present

## 2019-07-22 DIAGNOSIS — D689 Coagulation defect, unspecified: Secondary | ICD-10-CM | POA: Diagnosis not present

## 2019-07-22 DIAGNOSIS — C3412 Malignant neoplasm of upper lobe, left bronchus or lung: Secondary | ICD-10-CM | POA: Diagnosis not present

## 2019-07-22 DIAGNOSIS — Z992 Dependence on renal dialysis: Secondary | ICD-10-CM | POA: Diagnosis not present

## 2019-07-22 DIAGNOSIS — Z510 Encounter for antineoplastic radiation therapy: Secondary | ICD-10-CM | POA: Diagnosis not present

## 2019-07-22 DIAGNOSIS — E876 Hypokalemia: Secondary | ICD-10-CM | POA: Diagnosis not present

## 2019-07-22 DIAGNOSIS — Z23 Encounter for immunization: Secondary | ICD-10-CM | POA: Diagnosis not present

## 2019-07-22 DIAGNOSIS — N186 End stage renal disease: Secondary | ICD-10-CM | POA: Diagnosis not present

## 2019-07-22 DIAGNOSIS — D631 Anemia in chronic kidney disease: Secondary | ICD-10-CM | POA: Diagnosis not present

## 2019-07-23 ENCOUNTER — Other Ambulatory Visit: Payer: Self-pay

## 2019-07-23 ENCOUNTER — Ambulatory Visit
Admission: RE | Admit: 2019-07-23 | Discharge: 2019-07-23 | Disposition: A | Discharge status: Home / Self Care | Payer: Medicare Other | Source: Ambulatory Visit | Attending: Radiation Oncology | Admitting: Radiation Oncology

## 2019-07-23 DIAGNOSIS — C3412 Malignant neoplasm of upper lobe, left bronchus or lung: Secondary | ICD-10-CM | POA: Diagnosis not present

## 2019-07-23 DIAGNOSIS — Z510 Encounter for antineoplastic radiation therapy: Secondary | ICD-10-CM | POA: Diagnosis not present

## 2019-07-24 ENCOUNTER — Ambulatory Visit
Admission: RE | Admit: 2019-07-24 | Discharge: 2019-07-24 | Disposition: A | Discharge status: Home / Self Care | Payer: Medicare Other | Source: Ambulatory Visit | Attending: Radiation Oncology | Admitting: Radiation Oncology

## 2019-07-24 ENCOUNTER — Other Ambulatory Visit: Payer: Self-pay

## 2019-07-24 DIAGNOSIS — Z510 Encounter for antineoplastic radiation therapy: Secondary | ICD-10-CM | POA: Diagnosis not present

## 2019-07-24 DIAGNOSIS — N2581 Secondary hyperparathyroidism of renal origin: Secondary | ICD-10-CM | POA: Diagnosis not present

## 2019-07-24 DIAGNOSIS — Z992 Dependence on renal dialysis: Secondary | ICD-10-CM | POA: Diagnosis not present

## 2019-07-24 DIAGNOSIS — E876 Hypokalemia: Secondary | ICD-10-CM | POA: Diagnosis not present

## 2019-07-24 DIAGNOSIS — D689 Coagulation defect, unspecified: Secondary | ICD-10-CM | POA: Diagnosis not present

## 2019-07-24 DIAGNOSIS — D631 Anemia in chronic kidney disease: Secondary | ICD-10-CM | POA: Diagnosis not present

## 2019-07-24 DIAGNOSIS — N186 End stage renal disease: Secondary | ICD-10-CM | POA: Diagnosis not present

## 2019-07-24 DIAGNOSIS — Z23 Encounter for immunization: Secondary | ICD-10-CM | POA: Diagnosis not present

## 2019-07-24 DIAGNOSIS — C3412 Malignant neoplasm of upper lobe, left bronchus or lung: Secondary | ICD-10-CM | POA: Diagnosis not present

## 2019-07-25 ENCOUNTER — Ambulatory Visit
Admission: RE | Admit: 2019-07-25 | Discharge: 2019-07-25 | Disposition: A | Discharge status: Home / Self Care | Payer: Medicare Other | Source: Ambulatory Visit | Attending: Radiation Oncology | Admitting: Radiation Oncology

## 2019-07-25 ENCOUNTER — Other Ambulatory Visit: Payer: Self-pay

## 2019-07-25 DIAGNOSIS — C3412 Malignant neoplasm of upper lobe, left bronchus or lung: Secondary | ICD-10-CM | POA: Diagnosis not present

## 2019-07-25 DIAGNOSIS — Z510 Encounter for antineoplastic radiation therapy: Secondary | ICD-10-CM | POA: Diagnosis not present

## 2019-07-26 DIAGNOSIS — E876 Hypokalemia: Secondary | ICD-10-CM | POA: Diagnosis not present

## 2019-07-26 DIAGNOSIS — Z992 Dependence on renal dialysis: Secondary | ICD-10-CM | POA: Diagnosis not present

## 2019-07-26 DIAGNOSIS — Z23 Encounter for immunization: Secondary | ICD-10-CM | POA: Diagnosis not present

## 2019-07-26 DIAGNOSIS — D631 Anemia in chronic kidney disease: Secondary | ICD-10-CM | POA: Diagnosis not present

## 2019-07-26 DIAGNOSIS — D689 Coagulation defect, unspecified: Secondary | ICD-10-CM | POA: Diagnosis not present

## 2019-07-26 DIAGNOSIS — N2581 Secondary hyperparathyroidism of renal origin: Secondary | ICD-10-CM | POA: Diagnosis not present

## 2019-07-26 DIAGNOSIS — N186 End stage renal disease: Secondary | ICD-10-CM | POA: Diagnosis not present

## 2019-07-28 ENCOUNTER — Ambulatory Visit
Admission: RE | Admit: 2019-07-28 | Discharge: 2019-07-28 | Disposition: A | Discharge status: Home / Self Care | Payer: Medicare Other | Source: Ambulatory Visit | Attending: Radiation Oncology | Admitting: Radiation Oncology

## 2019-07-28 ENCOUNTER — Encounter: Payer: Self-pay | Admitting: Radiation Oncology

## 2019-07-28 ENCOUNTER — Other Ambulatory Visit: Payer: Self-pay

## 2019-07-28 DIAGNOSIS — C3412 Malignant neoplasm of upper lobe, left bronchus or lung: Secondary | ICD-10-CM | POA: Diagnosis not present

## 2019-07-28 DIAGNOSIS — Z298 Encounter for other specified prophylactic measures: Secondary | ICD-10-CM | POA: Diagnosis not present

## 2019-07-28 DIAGNOSIS — Z510 Encounter for antineoplastic radiation therapy: Secondary | ICD-10-CM | POA: Diagnosis not present

## 2019-07-29 DIAGNOSIS — E876 Hypokalemia: Secondary | ICD-10-CM | POA: Diagnosis not present

## 2019-07-29 DIAGNOSIS — Z992 Dependence on renal dialysis: Secondary | ICD-10-CM | POA: Diagnosis not present

## 2019-07-29 DIAGNOSIS — D631 Anemia in chronic kidney disease: Secondary | ICD-10-CM | POA: Diagnosis not present

## 2019-07-29 DIAGNOSIS — D689 Coagulation defect, unspecified: Secondary | ICD-10-CM | POA: Diagnosis not present

## 2019-07-29 DIAGNOSIS — Z23 Encounter for immunization: Secondary | ICD-10-CM | POA: Diagnosis not present

## 2019-07-29 DIAGNOSIS — N2581 Secondary hyperparathyroidism of renal origin: Secondary | ICD-10-CM | POA: Diagnosis not present

## 2019-07-29 DIAGNOSIS — N186 End stage renal disease: Secondary | ICD-10-CM | POA: Diagnosis not present

## 2019-07-30 DIAGNOSIS — T886XXA Anaphylactic reaction due to adverse effect of correct drug or medicament properly administered, initial encounter: Secondary | ICD-10-CM | POA: Insufficient documentation

## 2019-07-31 DIAGNOSIS — N2581 Secondary hyperparathyroidism of renal origin: Secondary | ICD-10-CM | POA: Diagnosis not present

## 2019-07-31 DIAGNOSIS — Z992 Dependence on renal dialysis: Secondary | ICD-10-CM | POA: Diagnosis not present

## 2019-07-31 DIAGNOSIS — D631 Anemia in chronic kidney disease: Secondary | ICD-10-CM | POA: Diagnosis not present

## 2019-07-31 DIAGNOSIS — Z23 Encounter for immunization: Secondary | ICD-10-CM | POA: Diagnosis not present

## 2019-07-31 DIAGNOSIS — N186 End stage renal disease: Secondary | ICD-10-CM | POA: Diagnosis not present

## 2019-07-31 DIAGNOSIS — E876 Hypokalemia: Secondary | ICD-10-CM | POA: Diagnosis not present

## 2019-07-31 DIAGNOSIS — D689 Coagulation defect, unspecified: Secondary | ICD-10-CM | POA: Diagnosis not present

## 2019-08-02 DIAGNOSIS — Z992 Dependence on renal dialysis: Secondary | ICD-10-CM | POA: Diagnosis not present

## 2019-08-02 DIAGNOSIS — D689 Coagulation defect, unspecified: Secondary | ICD-10-CM | POA: Diagnosis not present

## 2019-08-02 DIAGNOSIS — N2581 Secondary hyperparathyroidism of renal origin: Secondary | ICD-10-CM | POA: Diagnosis not present

## 2019-08-02 DIAGNOSIS — Z23 Encounter for immunization: Secondary | ICD-10-CM | POA: Diagnosis not present

## 2019-08-02 DIAGNOSIS — D631 Anemia in chronic kidney disease: Secondary | ICD-10-CM | POA: Diagnosis not present

## 2019-08-02 DIAGNOSIS — N186 End stage renal disease: Secondary | ICD-10-CM | POA: Diagnosis not present

## 2019-08-02 DIAGNOSIS — E876 Hypokalemia: Secondary | ICD-10-CM | POA: Diagnosis not present

## 2019-08-03 NOTE — Progress Notes (Signed)
  Radiation Oncology         [phone]) [phone] ________________________________  Name: Jeremy Johnson MRN: [id number]  Date: 07/28/2019  DOB: 06-Mar-1946  End of Treatment Note  Diagnosis:   73 y.o.male withlimited stage small cell lung cancer of the left upper lung     Indication for treatment:  Palliation       Radiation treatment dates:   07/14/19-07/28/19  Site/dose:   The whole brain was treated to 25 Gy in 10 fractions of 2.5 Gy  Beams/energy:   Right and Left radiation fields were treated using 6 MV X-rays with custom MLC collimation to shield the eyes and face.  The patient was immobilized with a thermoplastic mask and isocenter was verified with weekly port films.  Narrative: The patient tolerated radiation treatment relatively well.   No acute complications occurred.  Plan: The patient has completed radiation treatment. The patient will return to radiation oncology clinic for routine followup in one month. I advised them to call or return sooner if they have any questions or concerns related to their recovery or treatment. ________________________________  Sheral Apley. Tammi Klippel, M.D.

## 2019-08-05 DIAGNOSIS — D631 Anemia in chronic kidney disease: Secondary | ICD-10-CM | POA: Diagnosis not present

## 2019-08-05 DIAGNOSIS — Z992 Dependence on renal dialysis: Secondary | ICD-10-CM | POA: Diagnosis not present

## 2019-08-05 DIAGNOSIS — D689 Coagulation defect, unspecified: Secondary | ICD-10-CM | POA: Diagnosis not present

## 2019-08-05 DIAGNOSIS — E876 Hypokalemia: Secondary | ICD-10-CM | POA: Diagnosis not present

## 2019-08-05 DIAGNOSIS — N186 End stage renal disease: Secondary | ICD-10-CM | POA: Diagnosis not present

## 2019-08-05 DIAGNOSIS — Z23 Encounter for immunization: Secondary | ICD-10-CM | POA: Diagnosis not present

## 2019-08-05 DIAGNOSIS — N2581 Secondary hyperparathyroidism of renal origin: Secondary | ICD-10-CM | POA: Diagnosis not present

## 2019-08-07 DIAGNOSIS — D689 Coagulation defect, unspecified: Secondary | ICD-10-CM | POA: Diagnosis not present

## 2019-08-07 DIAGNOSIS — E876 Hypokalemia: Secondary | ICD-10-CM | POA: Diagnosis not present

## 2019-08-07 DIAGNOSIS — N2581 Secondary hyperparathyroidism of renal origin: Secondary | ICD-10-CM | POA: Diagnosis not present

## 2019-08-07 DIAGNOSIS — D631 Anemia in chronic kidney disease: Secondary | ICD-10-CM | POA: Diagnosis not present

## 2019-08-07 DIAGNOSIS — Z992 Dependence on renal dialysis: Secondary | ICD-10-CM | POA: Diagnosis not present

## 2019-08-07 DIAGNOSIS — N186 End stage renal disease: Secondary | ICD-10-CM | POA: Diagnosis not present

## 2019-08-07 DIAGNOSIS — Z23 Encounter for immunization: Secondary | ICD-10-CM | POA: Diagnosis not present

## 2019-08-08 DIAGNOSIS — I10 Essential (primary) hypertension: Secondary | ICD-10-CM | POA: Diagnosis not present

## 2019-08-08 DIAGNOSIS — E039 Hypothyroidism, unspecified: Secondary | ICD-10-CM | POA: Diagnosis not present

## 2019-08-08 DIAGNOSIS — E785 Hyperlipidemia, unspecified: Secondary | ICD-10-CM | POA: Diagnosis not present

## 2019-08-08 DIAGNOSIS — Z79899 Other long term (current) drug therapy: Secondary | ICD-10-CM | POA: Diagnosis not present

## 2019-08-08 DIAGNOSIS — I2510 Atherosclerotic heart disease of native coronary artery without angina pectoris: Secondary | ICD-10-CM | POA: Diagnosis not present

## 2019-08-09 DIAGNOSIS — D689 Coagulation defect, unspecified: Secondary | ICD-10-CM | POA: Diagnosis not present

## 2019-08-09 DIAGNOSIS — N186 End stage renal disease: Secondary | ICD-10-CM | POA: Diagnosis not present

## 2019-08-09 DIAGNOSIS — Z23 Encounter for immunization: Secondary | ICD-10-CM | POA: Diagnosis not present

## 2019-08-09 DIAGNOSIS — Z992 Dependence on renal dialysis: Secondary | ICD-10-CM | POA: Diagnosis not present

## 2019-08-09 DIAGNOSIS — E876 Hypokalemia: Secondary | ICD-10-CM | POA: Diagnosis not present

## 2019-08-09 DIAGNOSIS — D631 Anemia in chronic kidney disease: Secondary | ICD-10-CM | POA: Diagnosis not present

## 2019-08-09 DIAGNOSIS — N2581 Secondary hyperparathyroidism of renal origin: Secondary | ICD-10-CM | POA: Diagnosis not present

## 2019-08-12 DIAGNOSIS — D631 Anemia in chronic kidney disease: Secondary | ICD-10-CM | POA: Diagnosis not present

## 2019-08-12 DIAGNOSIS — Z23 Encounter for immunization: Secondary | ICD-10-CM | POA: Diagnosis not present

## 2019-08-12 DIAGNOSIS — Z992 Dependence on renal dialysis: Secondary | ICD-10-CM | POA: Diagnosis not present

## 2019-08-12 DIAGNOSIS — N186 End stage renal disease: Secondary | ICD-10-CM | POA: Diagnosis not present

## 2019-08-12 DIAGNOSIS — E876 Hypokalemia: Secondary | ICD-10-CM | POA: Diagnosis not present

## 2019-08-12 DIAGNOSIS — N2581 Secondary hyperparathyroidism of renal origin: Secondary | ICD-10-CM | POA: Diagnosis not present

## 2019-08-12 DIAGNOSIS — D689 Coagulation defect, unspecified: Secondary | ICD-10-CM | POA: Diagnosis not present

## 2019-08-13 DIAGNOSIS — Z992 Dependence on renal dialysis: Secondary | ICD-10-CM | POA: Diagnosis not present

## 2019-08-13 DIAGNOSIS — I129 Hypertensive chronic kidney disease with stage 1 through stage 4 chronic kidney disease, or unspecified chronic kidney disease: Secondary | ICD-10-CM | POA: Diagnosis not present

## 2019-08-13 DIAGNOSIS — N189 Chronic kidney disease, unspecified: Secondary | ICD-10-CM | POA: Diagnosis not present

## 2019-08-14 DIAGNOSIS — D689 Coagulation defect, unspecified: Secondary | ICD-10-CM | POA: Diagnosis not present

## 2019-08-14 DIAGNOSIS — N2581 Secondary hyperparathyroidism of renal origin: Secondary | ICD-10-CM | POA: Diagnosis not present

## 2019-08-14 DIAGNOSIS — Z992 Dependence on renal dialysis: Secondary | ICD-10-CM | POA: Diagnosis not present

## 2019-08-14 DIAGNOSIS — D631 Anemia in chronic kidney disease: Secondary | ICD-10-CM | POA: Diagnosis not present

## 2019-08-14 DIAGNOSIS — E039 Hypothyroidism, unspecified: Secondary | ICD-10-CM | POA: Diagnosis not present

## 2019-08-14 DIAGNOSIS — N186 End stage renal disease: Secondary | ICD-10-CM | POA: Diagnosis not present

## 2019-08-14 DIAGNOSIS — E876 Hypokalemia: Secondary | ICD-10-CM | POA: Diagnosis not present

## 2019-08-15 DIAGNOSIS — K219 Gastro-esophageal reflux disease without esophagitis: Secondary | ICD-10-CM | POA: Diagnosis not present

## 2019-08-15 DIAGNOSIS — I132 Hypertensive heart and chronic kidney disease with heart failure and with stage 5 chronic kidney disease, or end stage renal disease: Secondary | ICD-10-CM | POA: Diagnosis not present

## 2019-08-15 DIAGNOSIS — C3490 Malignant neoplasm of unspecified part of unspecified bronchus or lung: Secondary | ICD-10-CM | POA: Diagnosis not present

## 2019-08-15 DIAGNOSIS — N186 End stage renal disease: Secondary | ICD-10-CM | POA: Diagnosis not present

## 2019-08-15 DIAGNOSIS — E785 Hyperlipidemia, unspecified: Secondary | ICD-10-CM | POA: Diagnosis not present

## 2019-08-16 DIAGNOSIS — D631 Anemia in chronic kidney disease: Secondary | ICD-10-CM | POA: Diagnosis not present

## 2019-08-16 DIAGNOSIS — N186 End stage renal disease: Secondary | ICD-10-CM | POA: Diagnosis not present

## 2019-08-16 DIAGNOSIS — E876 Hypokalemia: Secondary | ICD-10-CM | POA: Diagnosis not present

## 2019-08-16 DIAGNOSIS — D689 Coagulation defect, unspecified: Secondary | ICD-10-CM | POA: Diagnosis not present

## 2019-08-16 DIAGNOSIS — Z992 Dependence on renal dialysis: Secondary | ICD-10-CM | POA: Diagnosis not present

## 2019-08-16 DIAGNOSIS — N2581 Secondary hyperparathyroidism of renal origin: Secondary | ICD-10-CM | POA: Diagnosis not present

## 2019-08-16 DIAGNOSIS — E039 Hypothyroidism, unspecified: Secondary | ICD-10-CM | POA: Diagnosis not present

## 2019-08-18 ENCOUNTER — Telehealth: Payer: Self-pay | Admitting: Internal Medicine

## 2019-08-18 NOTE — Telephone Encounter (Signed)
R/s appt 10/5 sch message - pt is aware of appt date and time

## 2019-08-19 DIAGNOSIS — E876 Hypokalemia: Secondary | ICD-10-CM | POA: Diagnosis not present

## 2019-08-19 DIAGNOSIS — N186 End stage renal disease: Secondary | ICD-10-CM | POA: Diagnosis not present

## 2019-08-19 DIAGNOSIS — D631 Anemia in chronic kidney disease: Secondary | ICD-10-CM | POA: Diagnosis not present

## 2019-08-19 DIAGNOSIS — N2581 Secondary hyperparathyroidism of renal origin: Secondary | ICD-10-CM | POA: Diagnosis not present

## 2019-08-19 DIAGNOSIS — Z992 Dependence on renal dialysis: Secondary | ICD-10-CM | POA: Diagnosis not present

## 2019-08-19 DIAGNOSIS — E039 Hypothyroidism, unspecified: Secondary | ICD-10-CM | POA: Diagnosis not present

## 2019-08-19 DIAGNOSIS — D689 Coagulation defect, unspecified: Secondary | ICD-10-CM | POA: Diagnosis not present

## 2019-08-21 DIAGNOSIS — N186 End stage renal disease: Secondary | ICD-10-CM | POA: Diagnosis not present

## 2019-08-21 DIAGNOSIS — Z992 Dependence on renal dialysis: Secondary | ICD-10-CM | POA: Diagnosis not present

## 2019-08-21 DIAGNOSIS — N2581 Secondary hyperparathyroidism of renal origin: Secondary | ICD-10-CM | POA: Diagnosis not present

## 2019-08-21 DIAGNOSIS — D631 Anemia in chronic kidney disease: Secondary | ICD-10-CM | POA: Diagnosis not present

## 2019-08-21 DIAGNOSIS — E876 Hypokalemia: Secondary | ICD-10-CM | POA: Diagnosis not present

## 2019-08-21 DIAGNOSIS — E039 Hypothyroidism, unspecified: Secondary | ICD-10-CM | POA: Diagnosis not present

## 2019-08-21 DIAGNOSIS — D689 Coagulation defect, unspecified: Secondary | ICD-10-CM | POA: Diagnosis not present

## 2019-08-23 DIAGNOSIS — E876 Hypokalemia: Secondary | ICD-10-CM | POA: Diagnosis not present

## 2019-08-23 DIAGNOSIS — D631 Anemia in chronic kidney disease: Secondary | ICD-10-CM | POA: Diagnosis not present

## 2019-08-23 DIAGNOSIS — N186 End stage renal disease: Secondary | ICD-10-CM | POA: Diagnosis not present

## 2019-08-23 DIAGNOSIS — E039 Hypothyroidism, unspecified: Secondary | ICD-10-CM | POA: Diagnosis not present

## 2019-08-23 DIAGNOSIS — D689 Coagulation defect, unspecified: Secondary | ICD-10-CM | POA: Diagnosis not present

## 2019-08-23 DIAGNOSIS — Z992 Dependence on renal dialysis: Secondary | ICD-10-CM | POA: Diagnosis not present

## 2019-08-23 DIAGNOSIS — N2581 Secondary hyperparathyroidism of renal origin: Secondary | ICD-10-CM | POA: Diagnosis not present

## 2019-08-25 ENCOUNTER — Telehealth: Payer: Self-pay | Admitting: Radiation Oncology

## 2019-08-25 NOTE — Telephone Encounter (Signed)
Received voicemail message from patient requesting return call. Phoned patient back. Wife answered phone. She questions if patient's appointment on Wednesday is in person or over the phone. Explained the appointment with Ashlyn is over the phone. She verbalized understanding. She denies that she or the patient has further questions or needs.

## 2019-08-26 DIAGNOSIS — D631 Anemia in chronic kidney disease: Secondary | ICD-10-CM | POA: Diagnosis not present

## 2019-08-26 DIAGNOSIS — E876 Hypokalemia: Secondary | ICD-10-CM | POA: Diagnosis not present

## 2019-08-26 DIAGNOSIS — N2581 Secondary hyperparathyroidism of renal origin: Secondary | ICD-10-CM | POA: Diagnosis not present

## 2019-08-26 DIAGNOSIS — E039 Hypothyroidism, unspecified: Secondary | ICD-10-CM | POA: Diagnosis not present

## 2019-08-26 DIAGNOSIS — D689 Coagulation defect, unspecified: Secondary | ICD-10-CM | POA: Diagnosis not present

## 2019-08-26 DIAGNOSIS — Z992 Dependence on renal dialysis: Secondary | ICD-10-CM | POA: Diagnosis not present

## 2019-08-26 DIAGNOSIS — N186 End stage renal disease: Secondary | ICD-10-CM | POA: Diagnosis not present

## 2019-08-27 ENCOUNTER — Other Ambulatory Visit: Payer: Self-pay

## 2019-08-27 ENCOUNTER — Ambulatory Visit
Admission: RE | Admit: 2019-08-27 | Discharge: 2019-08-27 | Disposition: A | Discharge status: Home / Self Care | Payer: Medicare Other | Source: Ambulatory Visit | Attending: Urology | Admitting: Urology

## 2019-08-27 ENCOUNTER — Encounter: Payer: Self-pay | Admitting: Urology

## 2019-08-27 DIAGNOSIS — C3490 Malignant neoplasm of unspecified part of unspecified bronchus or lung: Secondary | ICD-10-CM

## 2019-08-27 NOTE — Progress Notes (Signed)
Radiation Oncology         [phone] ________________________________  Name: LORAS GRIESHOP MRN: [id number]  Date: 08/27/2019  DOB: [birth year]  Post Treatment Note  CC: [name], MD  [name], MD  Diagnosis:   73 y.o.male withlimited stage small cell lung cancer of the left upper lung     Interval Since Last Radiation:  4 weeks  07/14/19-07/28/19:  PCI: The whole brain was treated to 25 Gy in 10 fractions of 2.5 Gy.  Narrative:  I spoke with the patient to conduct his routine scheduled 73 month follow up visit via telephone to spare the patient unnecessary potential exposure in the healthcare setting during the current COVID-19 pandemic.  The patient was notified in advance and gave permission to proceed with this visit format. He tolerated radiation treatment relatively well.   No acute complications occurred.                              On review of systems, the patient states that he is doing very well overall and is currently without complaints.  He specifically denies headaches, N/V, diplopia, blurred vision, decreased auditory acuity, dizziness/imbalance, tremors or seizure activity. He remains in observation only for his systemic disease under the care and direction of Dr. Julien Nordmann and is due for repeat systemic imaging with CT Chest on 09/01/19. He is feeling well and denies increased shortness of breath, chest pain, productive cough or hemoptysis. He reports a healthy appetite and is maintaining his weight.  ALLERGIES:  is allergic to penicillins.  Meds: Current Outpatient Medications  Medication Sig Dispense Refill  . acetaminophen (TYLENOL) 325 MG tablet Take 2 tablets (650 mg total) by mouth every 6 (six) hours as needed for mild pain (or Fever >/= 101). 30 tablet 0  . allopurinol (ZYLOPRIM) 100 MG tablet Take 1 tablet (100 mg total) by mouth daily. 30 tablet 1  . amLODipine (NORVASC) 10 MG tablet Take by mouth.    . B Complex-C-Zn-Folic Acid (DIALYVITE [id number] 15)  0.8 MG TABS Take 1 tablet by mouth daily.    . Darbepoetin Alfa (ARANESP) 100 MCG/0.5ML SOSY injection Inject 0.5 mLs (100 mcg total) into the vein every Thursday with hemodialysis. 4.2 mL   . levothyroxine (SYNTHROID, LEVOTHROID) 175 MCG tablet Take 200 mcg by mouth daily before breakfast.     . metoprolol tartrate (LOPRESSOR) 50 MG tablet Take 25 mg by mouth 2 (two) times daily.     [name] omeprazole (PRILOSEC) 40 MG capsule     . polyethylene glycol (MIRALAX / GLYCOLAX) 17 g packet Take 17 g by mouth daily. 14 each 0  . simvastatin (ZOCOR) 20 MG tablet Take 20 mg by mouth at bedtime.     . lidocaine-prilocaine (EMLA) cream     . omeprazole (PRILOSEC) 20 MG capsule Take 20 mg by mouth daily.    . sevelamer carbonate (RENVELA) 800 MG tablet Take 2 tablets (1,600 mg total) by mouth 3 (three) times daily with meals. (Patient not taking: Reported on 06/18/2019) 180 tablet 2  . sucralfate (CARAFATE) 1 g tablet Take 1 tablet (1 g total) by mouth 4 (four) times daily -  with meals and at bedtime. 5 min before meals for radiation induced esophagitis (Patient not taking: Reported on 06/18/2019) 120 tablet 2   No current facility-administered medications for this encounter.     Physical Findings:  vitals were not taken for this visit.   /  Unable to assess due to telephone follow up visit format.  Lab Findings: Lab Results  Component Value Date   WBC 7.1 05/26/2019   HGB 10.1 (L) 05/26/2019   HCT 30.1 (L) 05/26/2019   MCV 89.9 05/26/2019   PLT 12 (L) 05/26/2019     Radiographic Findings: No results found.  Impression/Plan: 1. 73 y.o.male withlimited stage small cell lung cancer of the left upper lung.    He appears to have recovered well form the effects of his recent PCI and is currently without complaints.  We discussed that while we are happy to conitnue to participate in his care if clinically indicated, at this point, we will plan to see him back on an as needed basis. He will continue in  routine follow up under the care and direction of Dr. Julien Nordmann for continued monitoring and management of his systemic disease.  The current plan is to continue in observation only with scheduled repeat systemic imaging on 09/01/19 prior to his next follow up with Dr. Julien Nordmann on 09/04/19.  He knows to call at any time with any questions or concerns regarding his previous radiotherapy.    Nicholos Johns, PA-C

## 2019-08-28 DIAGNOSIS — E876 Hypokalemia: Secondary | ICD-10-CM | POA: Diagnosis not present

## 2019-08-28 DIAGNOSIS — N2581 Secondary hyperparathyroidism of renal origin: Secondary | ICD-10-CM | POA: Diagnosis not present

## 2019-08-28 DIAGNOSIS — Z992 Dependence on renal dialysis: Secondary | ICD-10-CM | POA: Diagnosis not present

## 2019-08-28 DIAGNOSIS — E039 Hypothyroidism, unspecified: Secondary | ICD-10-CM | POA: Diagnosis not present

## 2019-08-28 DIAGNOSIS — D631 Anemia in chronic kidney disease: Secondary | ICD-10-CM | POA: Diagnosis not present

## 2019-08-28 DIAGNOSIS — N186 End stage renal disease: Secondary | ICD-10-CM | POA: Diagnosis not present

## 2019-08-28 DIAGNOSIS — D689 Coagulation defect, unspecified: Secondary | ICD-10-CM | POA: Diagnosis not present

## 2019-08-30 DIAGNOSIS — Z992 Dependence on renal dialysis: Secondary | ICD-10-CM | POA: Diagnosis not present

## 2019-08-30 DIAGNOSIS — E039 Hypothyroidism, unspecified: Secondary | ICD-10-CM | POA: Diagnosis not present

## 2019-08-30 DIAGNOSIS — E876 Hypokalemia: Secondary | ICD-10-CM | POA: Diagnosis not present

## 2019-08-30 DIAGNOSIS — D631 Anemia in chronic kidney disease: Secondary | ICD-10-CM | POA: Diagnosis not present

## 2019-08-30 DIAGNOSIS — D689 Coagulation defect, unspecified: Secondary | ICD-10-CM | POA: Diagnosis not present

## 2019-08-30 DIAGNOSIS — N186 End stage renal disease: Secondary | ICD-10-CM | POA: Diagnosis not present

## 2019-08-30 DIAGNOSIS — N2581 Secondary hyperparathyroidism of renal origin: Secondary | ICD-10-CM | POA: Diagnosis not present

## 2019-09-01 ENCOUNTER — Inpatient Hospital Stay: Payer: Medicare Other | Attending: Internal Medicine

## 2019-09-01 ENCOUNTER — Telehealth: Payer: Self-pay | Admitting: *Deleted

## 2019-09-01 ENCOUNTER — Ambulatory Visit (HOSPITAL_COMMUNITY)
Admission: RE | Admit: 2019-09-01 | Discharge: 2019-09-01 | Disposition: A | Discharge status: Home / Self Care | Payer: Medicare Other | Source: Ambulatory Visit | Attending: Internal Medicine | Admitting: Internal Medicine

## 2019-09-01 ENCOUNTER — Encounter (HOSPITAL_COMMUNITY): Payer: Self-pay

## 2019-09-01 ENCOUNTER — Other Ambulatory Visit: Payer: Self-pay

## 2019-09-01 DIAGNOSIS — I700 Atherosclerosis of aorta: Secondary | ICD-10-CM | POA: Diagnosis not present

## 2019-09-01 DIAGNOSIS — E039 Hypothyroidism, unspecified: Secondary | ICD-10-CM | POA: Insufficient documentation

## 2019-09-01 DIAGNOSIS — C3481 Malignant neoplasm of overlapping sites of right bronchus and lung: Secondary | ICD-10-CM | POA: Diagnosis not present

## 2019-09-01 DIAGNOSIS — I252 Old myocardial infarction: Secondary | ICD-10-CM | POA: Insufficient documentation

## 2019-09-01 DIAGNOSIS — I120 Hypertensive chronic kidney disease with stage 5 chronic kidney disease or end stage renal disease: Secondary | ICD-10-CM | POA: Insufficient documentation

## 2019-09-01 DIAGNOSIS — Z992 Dependence on renal dialysis: Secondary | ICD-10-CM | POA: Insufficient documentation

## 2019-09-01 DIAGNOSIS — Z79899 Other long term (current) drug therapy: Secondary | ICD-10-CM | POA: Insufficient documentation

## 2019-09-01 DIAGNOSIS — C3411 Malignant neoplasm of upper lobe, right bronchus or lung: Secondary | ICD-10-CM | POA: Diagnosis not present

## 2019-09-01 DIAGNOSIS — N186 End stage renal disease: Secondary | ICD-10-CM | POA: Diagnosis not present

## 2019-09-01 DIAGNOSIS — J439 Emphysema, unspecified: Secondary | ICD-10-CM | POA: Diagnosis not present

## 2019-09-01 DIAGNOSIS — Z880 Allergy status to penicillin: Secondary | ICD-10-CM | POA: Insufficient documentation

## 2019-09-01 DIAGNOSIS — Z5111 Encounter for antineoplastic chemotherapy: Secondary | ICD-10-CM | POA: Diagnosis not present

## 2019-09-01 DIAGNOSIS — R634 Abnormal weight loss: Secondary | ICD-10-CM | POA: Diagnosis not present

## 2019-09-01 DIAGNOSIS — C3490 Malignant neoplasm of unspecified part of unspecified bronchus or lung: Secondary | ICD-10-CM | POA: Diagnosis not present

## 2019-09-01 LAB — CBC WITH DIFFERENTIAL (CANCER CENTER ONLY)
Abs Immature Granulocytes: 0.04 10*3/uL (ref 0.00–0.07)
Basophils Absolute: 0.1 10*3/uL (ref 0.0–0.1)
Basophils Relative: 1 %
Eosinophils Absolute: 0.3 10*3/uL (ref 0.0–0.5)
Eosinophils Relative: 3 %
HCT: 33 % — ABNORMAL LOW (ref 39.0–52.0)
Hemoglobin: 10.6 g/dL — ABNORMAL LOW (ref 13.0–17.0)
Immature Granulocytes: 1 %
Lymphocytes Relative: 21 %
Lymphs Abs: 1.8 10*3/uL (ref 0.7–4.0)
MCH: 30.8 pg (ref 26.0–34.0)
MCHC: 32.1 g/dL (ref 30.0–36.0)
MCV: 95.9 fL (ref 80.0–100.0)
Monocytes Absolute: 1 10*3/uL (ref 0.1–1.0)
Monocytes Relative: 11 %
Neutro Abs: 5.7 10*3/uL (ref 1.7–7.7)
Neutrophils Relative %: 63 %
Platelet Count: 187 10*3/uL (ref 150–400)
RBC: 3.44 MIL/uL — ABNORMAL LOW (ref 4.22–5.81)
RDW: 16.5 % — ABNORMAL HIGH (ref 11.5–15.5)
WBC Count: 8.9 10*3/uL (ref 4.0–10.5)
nRBC: 0 % (ref 0.0–0.2)

## 2019-09-01 LAB — CMP (CANCER CENTER ONLY)
ALT: 9 U/L (ref 0–44)
AST: 20 U/L (ref 15–41)
Albumin: 2.4 g/dL — ABNORMAL LOW (ref 3.5–5.0)
Alkaline Phosphatase: 80 U/L (ref 38–126)
Anion gap: 10 (ref 5–15)
BUN: 19 mg/dL (ref 8–23)
CO2: 28 mmol/L (ref 22–32)
Calcium: 8.5 mg/dL — ABNORMAL LOW (ref 8.9–10.3)
Chloride: 99 mmol/L (ref 98–111)
Creatinine: 7.7 mg/dL (ref 0.61–1.24)
GFR, Est AFR Am: 7 mL/min — ABNORMAL LOW (ref >60)
GFR, Estimated: 6 mL/min — ABNORMAL LOW (ref >60)
Glucose, Bld: 158 mg/dL — ABNORMAL HIGH (ref 70–99)
Potassium: 4.3 mmol/L (ref 3.5–5.1)
Sodium: 137 mmol/L (ref 135–145)
Total Bilirubin: 0.3 mg/dL (ref 0.3–1.2)
Total Protein: 6.8 g/dL (ref 6.5–8.1)

## 2019-09-01 NOTE — Telephone Encounter (Signed)
Received call from patient, requesting to change the time of his appt with Dr. Julien Nordmann on 09/04/19 to later in the day d/t his dialysis. Scheduling request sent in to change his appt to [facility] for 1pm that day.  Pt aware

## 2019-09-02 ENCOUNTER — Other Ambulatory Visit: Payer: Medicare Other

## 2019-09-02 DIAGNOSIS — D631 Anemia in chronic kidney disease: Secondary | ICD-10-CM | POA: Diagnosis not present

## 2019-09-02 DIAGNOSIS — N2581 Secondary hyperparathyroidism of renal origin: Secondary | ICD-10-CM | POA: Diagnosis not present

## 2019-09-02 DIAGNOSIS — N186 End stage renal disease: Secondary | ICD-10-CM | POA: Diagnosis not present

## 2019-09-02 DIAGNOSIS — D689 Coagulation defect, unspecified: Secondary | ICD-10-CM | POA: Diagnosis not present

## 2019-09-02 DIAGNOSIS — E876 Hypokalemia: Secondary | ICD-10-CM | POA: Diagnosis not present

## 2019-09-02 DIAGNOSIS — E039 Hypothyroidism, unspecified: Secondary | ICD-10-CM | POA: Diagnosis not present

## 2019-09-02 DIAGNOSIS — Z992 Dependence on renal dialysis: Secondary | ICD-10-CM | POA: Diagnosis not present

## 2019-09-04 ENCOUNTER — Other Ambulatory Visit: Payer: Self-pay

## 2019-09-04 ENCOUNTER — Inpatient Hospital Stay (HOSPITAL_BASED_OUTPATIENT_CLINIC_OR_DEPARTMENT_OTHER): Payer: Medicare Other | Admitting: Physician Assistant

## 2019-09-04 ENCOUNTER — Inpatient Hospital Stay: Payer: Medicare Other | Admitting: Internal Medicine

## 2019-09-04 VITALS — BP 140/85 | HR 99 | Temp 99.1°F | Resp 18 | Ht 69.0 in | Wt 183.6 lb

## 2019-09-04 DIAGNOSIS — E876 Hypokalemia: Secondary | ICD-10-CM | POA: Diagnosis not present

## 2019-09-04 DIAGNOSIS — I120 Hypertensive chronic kidney disease with stage 5 chronic kidney disease or end stage renal disease: Secondary | ICD-10-CM | POA: Diagnosis not present

## 2019-09-04 DIAGNOSIS — Z992 Dependence on renal dialysis: Secondary | ICD-10-CM | POA: Diagnosis not present

## 2019-09-04 DIAGNOSIS — N186 End stage renal disease: Secondary | ICD-10-CM

## 2019-09-04 DIAGNOSIS — Z79899 Other long term (current) drug therapy: Secondary | ICD-10-CM | POA: Diagnosis not present

## 2019-09-04 DIAGNOSIS — N2581 Secondary hyperparathyroidism of renal origin: Secondary | ICD-10-CM | POA: Diagnosis not present

## 2019-09-04 DIAGNOSIS — Z880 Allergy status to penicillin: Secondary | ICD-10-CM | POA: Diagnosis not present

## 2019-09-04 DIAGNOSIS — E039 Hypothyroidism, unspecified: Secondary | ICD-10-CM | POA: Diagnosis not present

## 2019-09-04 DIAGNOSIS — I700 Atherosclerosis of aorta: Secondary | ICD-10-CM | POA: Diagnosis not present

## 2019-09-04 DIAGNOSIS — C3481 Malignant neoplasm of overlapping sites of right bronchus and lung: Secondary | ICD-10-CM | POA: Diagnosis not present

## 2019-09-04 DIAGNOSIS — D689 Coagulation defect, unspecified: Secondary | ICD-10-CM | POA: Diagnosis not present

## 2019-09-04 DIAGNOSIS — C3411 Malignant neoplasm of upper lobe, right bronchus or lung: Secondary | ICD-10-CM | POA: Diagnosis not present

## 2019-09-04 DIAGNOSIS — I252 Old myocardial infarction: Secondary | ICD-10-CM | POA: Diagnosis not present

## 2019-09-04 DIAGNOSIS — J439 Emphysema, unspecified: Secondary | ICD-10-CM | POA: Diagnosis not present

## 2019-09-04 DIAGNOSIS — D631 Anemia in chronic kidney disease: Secondary | ICD-10-CM | POA: Diagnosis not present

## 2019-09-04 NOTE — Progress Notes (Signed)
[facility] OFFICE PROGRESS NOTE  Jeremy Noble, MD [address] [place] [address]  DIAGNOSIS: Limited stage (T2b, N1, M0) small cell lung cancer presented with right upper lobe with invasion of superior segment of right lower lobe as well as right hilar lymphadenopathy diagnosed in March 2020.  PRIOR THERAPY:  1) Systemic chemotherapy with carboplatin for AUC of 5 on day 1 and etoposide 100 mg/M2 on days 1, 2 and 3 every 3 weeks. Status post 4 cycles. 2) Prophylactic cranial irradiation under the care of Dr. Tammi Klippel. Last treatment on 07/28/2019  CURRENT THERAPY: Observation   INTERVAL HISTORY: Jeremy Johnson 73 y.o. male returns to the clinic for a follow up visit. The patient is feeling well today without any concerning complaints. He completed 4 cycles of chemotherapy with concurrent radiation for his small cell lung cancer. He has been on observation for 3 months. He also completed PCI under the care of Dr. Tammi Klippel which he tolerated well.  Since his last visit, the patient is still undergoing dialysis on Tuesday/Thursday/Saturday. He denies any fever, chills, or night sweats. He reports weight loss secondary to a decreased appetite. He denies any nausea, vomiting, diarrhea, or constipation. He denies any headaches or visual changes. He reports his baseline cough and baseline shortness of breath. He denies any hemoptysis or chest pain. He recently had a restaging CT scan performed. He is here for evaluation and to review his scan results.   MEDICAL HISTORY: Past Medical History:  Diagnosis Date  . Anemia   . Blood transfusion without reported diagnosis   . CAD (coronary artery disease)    STENT... MID CIRCUMFLEX...1997  . Chronic kidney disease    STAGE 3  . COPD (chronic obstructive pulmonary disease) ([place])   . Degenerative joint disease (DJD) of lumbar spine   . GERD (gastroesophageal reflux disease)   . Gout   . Hyperlipidemia   . Hypertension   .  Hypothyroidism   . Incisional hernia    abdomen  . Leukocytosis    CHRONIC MILD  . Myocardial infarction ([place])    1997  . SCL CA dx'd 01/2019   Lung cancer    ALLERGIES:  is allergic to penicillins.  MEDICATIONS:  Current Outpatient Medications  Medication Sig Dispense Refill  . acetaminophen (TYLENOL) 325 MG tablet Take 2 tablets (650 mg total) by mouth every 6 (six) hours as needed for mild pain (or Fever >/= 101). 30 tablet 0  . allopurinol (ZYLOPRIM) 100 MG tablet Take 1 tablet (100 mg total) by mouth daily. 30 tablet 1  . amLODipine (NORVASC) 10 MG tablet Take by mouth.    . B Complex-C-Zn-Folic Acid (DIALYVITE 976-BHAL 15) 0.8 MG TABS Take 1 tablet by mouth daily.    . Darbepoetin Alfa (ARANESP) 100 MCG/0.5ML SOSY injection Inject 0.5 mLs (100 mcg total) into the vein every Thursday with hemodialysis. 4.2 mL   . levothyroxine (SYNTHROID, LEVOTHROID) 175 MCG tablet Take 200 mcg by mouth daily before breakfast.     . lidocaine-prilocaine (EMLA) cream     . metoprolol tartrate (LOPRESSOR) 50 MG tablet Take 25 mg by mouth 2 (two) times daily.     [name] omeprazole (PRILOSEC) 40 MG capsule     . polyethylene glycol (MIRALAX / GLYCOLAX) 17 g packet Take 17 g by mouth daily. 14 each 0  . simvastatin (ZOCOR) 20 MG tablet Take 20 mg by mouth at bedtime.      No current facility-administered medications for this  visit.     SURGICAL HISTORY:  Past Surgical History:  Procedure Laterality Date  . ABDOMINAL AORTIC ANEURYSM REPAIR  2006  . AV FISTULA PLACEMENT Left 04/25/2019   Procedure: ARTERIOVENOUS (AV) FISTULA CREATION LEFT ARM;  Surgeon: Angelia Mould, MD;  Location: [facility];  Service: Vascular;  Laterality: Left;  . AV FISTULA PLACEMENT Left 05/19/2019   Procedure: CONVERSION OF LEFT ARM ARTERIOVENOUS FISTULA TO GRAFT;  Surgeon: Angelia Mould, MD;  Location: [facility];  Service: Vascular;  Laterality: Left;  . BIOPSY  09/30/2018   Procedure: BIOPSY;  Surgeon: Danie Binder,  MD;  [facility];  Service: Endoscopy;;  ascending colon  . COLONOSCOPY  2008  . COLONOSCOPY N/A 09/30/2018   Procedure: COLONOSCOPY;  Surgeon: Danie Binder, MD;  [facility];  Service: Endoscopy;  Laterality: N/A;  9:00  . CORONARY ANGIOPLASTY WITH STENT [place]  . IR FLUORO GUIDE CV LINE RIGHT  04/22/2019  . IR US GUIDE VASC ACCESS RIGHT  04/22/2019  . POLYPECTOMY  09/30/2018   Procedure: POLYPECTOMY;  Surgeon: Danie Binder, MD;  [facility];  Service: Endoscopy;;  colon  . VIDEO BRONCHOSCOPY WITH ENDOBRONCHIAL NAVIGATION N/A 01/27/2019   Procedure: VIDEO BRONCHOSCOPY WITH ENDOBRONCHIAL NAVIGATION;  Surgeon: Grace Isaac, MD;  Location: [facility];  Service: Thoracic;  Laterality: N/A;  . VIDEO BRONCHOSCOPY WITH ENDOBRONCHIAL ULTRASOUND N/A 01/27/2019   Procedure: VIDEO BRONCHOSCOPY WITH ENDOBRONCHIAL ULTRASOUND;  Surgeon: Grace Isaac, MD;  Location: [facility];  Service: Thoracic;  Laterality: N/A;    REVIEW OF SYSTEMS:   Review of Systems  Constitutional: Negative for appetite change, chills, fatigue, fever and unexpected weight change.  HENT: Positive for nasal congestion. Negative for mouth sores, nosebleeds, sore throat and trouble swallowing.   Eyes: Negative for eye problems and icterus.  Respiratory: Positive for cough and baseline shortness of breath. Negative for hemoptysis and wheezing.   Cardiovascular: Negative for chest pain and leg swelling.  Gastrointestinal: Negative for abdominal pain, constipation, diarrhea, nausea and vomiting.  Genitourinary: Negative for bladder incontinence, difficulty urinating, dysuria, frequency and hematuria.   Musculoskeletal: Negative for back pain, gait problem, neck pain and neck stiffness.  Skin: Negative for itching and rash.  Neurological: Negative for dizziness, extremity weakness, gait problem, headaches, light-headedness and seizures.  Hematological: Negative for  adenopathy. Does not bruise/bleed easily.  Psychiatric/Behavioral: Negative for confusion, depression and sleep disturbance. The patient is not nervous/anxious.     PHYSICAL EXAMINATION:  Blood pressure 140/85, pulse 99, temperature 99.1 F (37.3 C), temperature source Oral, resp. rate 18, height 5\' 9"  (1.753 m), weight 183 lb 9.6 oz (83.3 kg), SpO2 96 %.  ECOG PERFORMANCE STATUS: 0 - Asymptomatic  Physical Exam  Constitutional: Oriented to person, place, and time and well-developed, well-nourished, and in no distress.  HENT:  Head: Normocephalic and atraumatic.  Mouth/Throat: Oropharynx is clear and moist. No oropharyngeal exudate.  Eyes: Conjunctivae are normal. Right eye exhibits no discharge. Left eye exhibits no discharge. No scleral icterus.  Neck: Normal range of motion. Neck supple.  Cardiovascular: Normal rate, regular rhythm, normal heart sounds and intact distal pulses.   Pulmonary/Chest: Effort normal and breath sounds normal. No respiratory distress. No wheezes. No rales.  Abdominal: Incisional hernia noted. Soft. Bowel sounds are normal. There is no tenderness.  Musculoskeletal: Normal range of motion. Exhibits no edema.  Lymphadenopathy:    No cervical adenopathy.  Neurological: Alert and oriented to  person, place, and time. Exhibits normal muscle tone. Gait normal. Coordination normal.  Skin: Skin is warm and dry. No rash noted. Not diaphoretic. No erythema. No pallor.  Psychiatric: Mood, memory and judgment normal.  Vitals reviewed.  LABORATORY DATA: Lab Results  Component Value Date   WBC 8.9 09/01/2019   HGB 10.6 (L) 09/01/2019   HCT 33.0 (L) 09/01/2019   MCV 95.9 09/01/2019   PLT 187 09/01/2019      Chemistry      Component Value Date/Time   NA 137 09/01/2019 0951   K 4.3 09/01/2019 0951   CL 99 09/01/2019 0951   CO2 28 09/01/2019 0951   BUN 19 09/01/2019 0951   CREATININE 7.70 (HH) 09/01/2019 0951      Component Value Date/Time   CALCIUM 8.5 (L)  09/01/2019 0951   ALKPHOS 80 09/01/2019 0951   AST 20 09/01/2019 0951   ALT 9 09/01/2019 0951   BILITOT 0.3 09/01/2019 0951       RADIOGRAPHIC STUDIES:  Ct Chest Wo Contrast  Result Date: 09/01/2019 CLINICAL DATA:  Small-cell lung cancer, 2-3 cycles of chemotherapy and radiation therapy complete. Dry cough for 2 weeks. Left lung radiation therapy 04/27/2022 -05/15/2019. EXAM: CT CHEST WITHOUT CONTRAST TECHNIQUE: Multidetector CT imaging of the chest was performed following the standard protocol without IV contrast. COMPARISON:  05/30/2019 FINDINGS: Cardiovascular: Aortic atherosclerosis. Proximal transverse aortic aneurysm appears similar, including at 4.5 cm on 54/2. No surrounding hemorrhage. Tortuous thoracic aorta. There is also dilatation of the aorta at the level of the diaphragmatic hiatus of 4.2 cm on 130/2, similar. Mild cardiomegaly, without pericardial effusion. Multivessel coronary artery atherosclerosis. Mediastinum/Nodes: No mediastinal or definite hilar adenopathy, given limitations of unenhanced CT. Lungs/Pleura: Small left and trace right pleural effusions, new since the prior CT. Moderate to marked bullous type emphysema. Thickening along the right minor fissure is similar, including at 1.0 cm on 103/7. Just inferior and lateral to this, an area of right minor fissure thickening measures 4 mm on 107/7 and is new. Left perihilar and paramediastinal geographic consolidation with traction bronchiectasis and architectural distortion are new. Upper Abdomen: Ventral abdominal wall hernia containing transverse colon. Normal imaged portions of the liver, spleen, stomach, pancreas, adrenal glands. Incompletely imaged right renal lesion of 1.9 cm is likely a cyst. Mild renal cortical thinning bilaterally. Abdominal aortic atherosclerosis. Musculoskeletal: Moderate upper and midthoracic spondylosis. IMPRESSION: 1. Development of left perihilar/paramediastinal presumably radiation induced  fibrosis. 2. Thickening along the right minor fissure is similar and slightly increased as detailed above. 3. New small left and trace right pleural fluid. 4. No thoracic adenopathy. 5. Aortic atherosclerosis (ICD10-I70.0), coronary artery atherosclerosis and emphysema (ICD10-J43.9). 6. Incompletely imaged ventral abdominal wall hernia containing transverse colon. 7. Similar transverse aortic aneurysm and thoracoabdominal aortic ectasia. Electronically Signed   By: Abigail Miyamoto M.D.   On: 09/01/2019 12:02     ASSESSMENT/PLAN:  This is a very pleasant 73 year old Caucasian male with limited stage small cell lung cancer. He presented with a right upper lobe mass with invasion of the superior segment of the right lower lobe as well as right hilar lymphadenopathy. He was diagnosed in March 2020.   He is currently undergoing treatment with carboplatin for an AUC of 5 on day 1 and etoposide80mg /m2days 1, 2, and 3 every 3 weeks. He is status post 4 cycles.   He had PCI under the care of Dr. Tammi Klippel.   The patient recently had a restaging CT scan. Dr. Julien Nordmann personally and independently  reviewed the scan and discussed the results with the patient today. The scan showed no evidence of disease progression. Dr. Julien Nordmann recommends that the patient continue on observation with a repeat CT scan of the chest in 3 months. Given the patient's kidney function, the scan will be ordered without contrast.   We will see the patient back for a follow up visit in 3 months for evaluation and to review his scan.   For the patient's hernia, this is followed by the patient's primary physician. Discussed with the patient that he may consider a referral for evaluation with a surgeon for his hernia. Discussed concerning symptoms which warrant emergency evaluation for his hernia.   The patient was advised to call immediately if he has any concerning symptoms in the interval. The patient voices understanding of current  disease status and treatment options and is in agreement with the current care plan. All questions were answered. The patient knows to call the clinic with any problems, questions or concerns. We can certainly see the patient much sooner if necessary    Orders Placed This Encounter  Procedures  . CT Chest Wo Contrast    Standing Status:   Future    Standing Expiration Date:   09/03/2020    Order Specific Question:   ** REASON FOR EXAM (FREE TEXT)    Answer:   Restaging Lung Cancer    Order Specific Question:   Preferred imaging location?    Answer:   [facility]    Order Specific Question:   Radiology Contrast Protocol - do NOT remove file path    Answer:   [email or web address]  . CBC with Differential ([facility] Only)    Standing Status:   Future    Standing Expiration Date:   09/03/2020  . CMP ([facility] only)    Standing Status:   Future    Standing Expiration Date:   09/03/2020     Tobe Sos Keosha Rossa, PA-C 09/04/19  ADDENDUM: Hematology/Oncology Attending: I had a face-to-face encounter with the patient.  I recommended his care plan.  This is a very pleasant 73 years old white male with limited stage small cell lung cancer status post a course of systemic chemotherapy concurrent with radiation followed by prophylactic cranial irradiation.  The patient has end-stage renal disease and currently on hemodialysis. He is feeling fine today with no concerning complaints except for fatigue.  He had repeat CT scan of the chest performed recently.  I personally and independently reviewed the scans and discussed the results with the patient today.  His scan showed no concerning findings for disease recurrence or progression. I recommended for the patient to continue on observation. I will see him back for follow-up visit in 3 months for evaluation with repeat CT scan of the chest. The patient was advised to call immediately if he has any  concerning symptoms in the interval.  Disclaimer: This note was dictated with voice recognition software. Similar sounding words can inadvertently be transcribed and may be missed upon review. Eilleen Kempf, MD 09/05/19

## 2019-09-05 ENCOUNTER — Encounter: Payer: Self-pay | Admitting: Physician Assistant

## 2019-09-05 ENCOUNTER — Telehealth: Payer: Self-pay | Admitting: Internal Medicine

## 2019-09-05 NOTE — Telephone Encounter (Signed)
Scheduled per los. Called and left msg. Mailed printout  °

## 2019-09-06 DIAGNOSIS — N186 End stage renal disease: Secondary | ICD-10-CM | POA: Diagnosis not present

## 2019-09-06 DIAGNOSIS — N2581 Secondary hyperparathyroidism of renal origin: Secondary | ICD-10-CM | POA: Diagnosis not present

## 2019-09-06 DIAGNOSIS — D689 Coagulation defect, unspecified: Secondary | ICD-10-CM | POA: Diagnosis not present

## 2019-09-06 DIAGNOSIS — E876 Hypokalemia: Secondary | ICD-10-CM | POA: Diagnosis not present

## 2019-09-06 DIAGNOSIS — Z992 Dependence on renal dialysis: Secondary | ICD-10-CM | POA: Diagnosis not present

## 2019-09-06 DIAGNOSIS — D631 Anemia in chronic kidney disease: Secondary | ICD-10-CM | POA: Diagnosis not present

## 2019-09-06 DIAGNOSIS — E039 Hypothyroidism, unspecified: Secondary | ICD-10-CM | POA: Diagnosis not present

## 2019-09-09 DIAGNOSIS — D631 Anemia in chronic kidney disease: Secondary | ICD-10-CM | POA: Diagnosis not present

## 2019-09-09 DIAGNOSIS — D689 Coagulation defect, unspecified: Secondary | ICD-10-CM | POA: Diagnosis not present

## 2019-09-09 DIAGNOSIS — N186 End stage renal disease: Secondary | ICD-10-CM | POA: Diagnosis not present

## 2019-09-09 DIAGNOSIS — E876 Hypokalemia: Secondary | ICD-10-CM | POA: Diagnosis not present

## 2019-09-09 DIAGNOSIS — Z992 Dependence on renal dialysis: Secondary | ICD-10-CM | POA: Diagnosis not present

## 2019-09-09 DIAGNOSIS — N2581 Secondary hyperparathyroidism of renal origin: Secondary | ICD-10-CM | POA: Diagnosis not present

## 2019-09-09 DIAGNOSIS — E039 Hypothyroidism, unspecified: Secondary | ICD-10-CM | POA: Diagnosis not present

## 2019-09-11 DIAGNOSIS — E876 Hypokalemia: Secondary | ICD-10-CM | POA: Diagnosis not present

## 2019-09-11 DIAGNOSIS — D689 Coagulation defect, unspecified: Secondary | ICD-10-CM | POA: Diagnosis not present

## 2019-09-11 DIAGNOSIS — Z992 Dependence on renal dialysis: Secondary | ICD-10-CM | POA: Diagnosis not present

## 2019-09-11 DIAGNOSIS — E039 Hypothyroidism, unspecified: Secondary | ICD-10-CM | POA: Diagnosis not present

## 2019-09-11 DIAGNOSIS — N2581 Secondary hyperparathyroidism of renal origin: Secondary | ICD-10-CM | POA: Diagnosis not present

## 2019-09-11 DIAGNOSIS — D631 Anemia in chronic kidney disease: Secondary | ICD-10-CM | POA: Diagnosis not present

## 2019-09-11 DIAGNOSIS — N186 End stage renal disease: Secondary | ICD-10-CM | POA: Diagnosis not present

## 2019-09-13 DIAGNOSIS — D689 Coagulation defect, unspecified: Secondary | ICD-10-CM | POA: Diagnosis not present

## 2019-09-13 DIAGNOSIS — Z992 Dependence on renal dialysis: Secondary | ICD-10-CM | POA: Diagnosis not present

## 2019-09-13 DIAGNOSIS — N2581 Secondary hyperparathyroidism of renal origin: Secondary | ICD-10-CM | POA: Diagnosis not present

## 2019-09-13 DIAGNOSIS — E876 Hypokalemia: Secondary | ICD-10-CM | POA: Diagnosis not present

## 2019-09-13 DIAGNOSIS — D631 Anemia in chronic kidney disease: Secondary | ICD-10-CM | POA: Diagnosis not present

## 2019-09-13 DIAGNOSIS — N189 Chronic kidney disease, unspecified: Secondary | ICD-10-CM | POA: Diagnosis not present

## 2019-09-13 DIAGNOSIS — N186 End stage renal disease: Secondary | ICD-10-CM | POA: Diagnosis not present

## 2019-09-13 DIAGNOSIS — I129 Hypertensive chronic kidney disease with stage 1 through stage 4 chronic kidney disease, or unspecified chronic kidney disease: Secondary | ICD-10-CM | POA: Diagnosis not present

## 2019-09-13 DIAGNOSIS — E039 Hypothyroidism, unspecified: Secondary | ICD-10-CM | POA: Diagnosis not present

## 2019-09-14 DIAGNOSIS — Z992 Dependence on renal dialysis: Secondary | ICD-10-CM | POA: Diagnosis not present

## 2019-09-14 DIAGNOSIS — I129 Hypertensive chronic kidney disease with stage 1 through stage 4 chronic kidney disease, or unspecified chronic kidney disease: Secondary | ICD-10-CM | POA: Diagnosis not present

## 2019-09-14 DIAGNOSIS — N189 Chronic kidney disease, unspecified: Secondary | ICD-10-CM | POA: Diagnosis not present

## 2019-09-16 DIAGNOSIS — N186 End stage renal disease: Secondary | ICD-10-CM | POA: Diagnosis not present

## 2019-09-16 DIAGNOSIS — Z992 Dependence on renal dialysis: Secondary | ICD-10-CM | POA: Diagnosis not present

## 2019-09-16 DIAGNOSIS — D631 Anemia in chronic kidney disease: Secondary | ICD-10-CM | POA: Diagnosis not present

## 2019-09-16 DIAGNOSIS — E876 Hypokalemia: Secondary | ICD-10-CM | POA: Diagnosis not present

## 2019-09-16 DIAGNOSIS — N2581 Secondary hyperparathyroidism of renal origin: Secondary | ICD-10-CM | POA: Diagnosis not present

## 2019-09-16 DIAGNOSIS — D509 Iron deficiency anemia, unspecified: Secondary | ICD-10-CM | POA: Diagnosis not present

## 2019-09-16 DIAGNOSIS — D689 Coagulation defect, unspecified: Secondary | ICD-10-CM | POA: Diagnosis not present

## 2019-09-18 DIAGNOSIS — D509 Iron deficiency anemia, unspecified: Secondary | ICD-10-CM | POA: Diagnosis not present

## 2019-09-18 DIAGNOSIS — D631 Anemia in chronic kidney disease: Secondary | ICD-10-CM | POA: Diagnosis not present

## 2019-09-18 DIAGNOSIS — N2581 Secondary hyperparathyroidism of renal origin: Secondary | ICD-10-CM | POA: Diagnosis not present

## 2019-09-18 DIAGNOSIS — E876 Hypokalemia: Secondary | ICD-10-CM | POA: Diagnosis not present

## 2019-09-18 DIAGNOSIS — N186 End stage renal disease: Secondary | ICD-10-CM | POA: Diagnosis not present

## 2019-09-18 DIAGNOSIS — Z992 Dependence on renal dialysis: Secondary | ICD-10-CM | POA: Diagnosis not present

## 2019-09-18 DIAGNOSIS — D689 Coagulation defect, unspecified: Secondary | ICD-10-CM | POA: Diagnosis not present

## 2019-09-20 DIAGNOSIS — D689 Coagulation defect, unspecified: Secondary | ICD-10-CM | POA: Diagnosis not present

## 2019-09-20 DIAGNOSIS — E876 Hypokalemia: Secondary | ICD-10-CM | POA: Diagnosis not present

## 2019-09-20 DIAGNOSIS — D509 Iron deficiency anemia, unspecified: Secondary | ICD-10-CM | POA: Diagnosis not present

## 2019-09-20 DIAGNOSIS — N2581 Secondary hyperparathyroidism of renal origin: Secondary | ICD-10-CM | POA: Diagnosis not present

## 2019-09-20 DIAGNOSIS — D631 Anemia in chronic kidney disease: Secondary | ICD-10-CM | POA: Diagnosis not present

## 2019-09-20 DIAGNOSIS — N186 End stage renal disease: Secondary | ICD-10-CM | POA: Diagnosis not present

## 2019-09-20 DIAGNOSIS — Z992 Dependence on renal dialysis: Secondary | ICD-10-CM | POA: Diagnosis not present

## 2019-09-23 DIAGNOSIS — D631 Anemia in chronic kidney disease: Secondary | ICD-10-CM | POA: Diagnosis not present

## 2019-09-23 DIAGNOSIS — N2581 Secondary hyperparathyroidism of renal origin: Secondary | ICD-10-CM | POA: Diagnosis not present

## 2019-09-23 DIAGNOSIS — D689 Coagulation defect, unspecified: Secondary | ICD-10-CM | POA: Diagnosis not present

## 2019-09-23 DIAGNOSIS — N186 End stage renal disease: Secondary | ICD-10-CM | POA: Diagnosis not present

## 2019-09-23 DIAGNOSIS — Z992 Dependence on renal dialysis: Secondary | ICD-10-CM | POA: Diagnosis not present

## 2019-09-23 DIAGNOSIS — D509 Iron deficiency anemia, unspecified: Secondary | ICD-10-CM | POA: Diagnosis not present

## 2019-09-23 DIAGNOSIS — E876 Hypokalemia: Secondary | ICD-10-CM | POA: Diagnosis not present

## 2019-09-25 DIAGNOSIS — N186 End stage renal disease: Secondary | ICD-10-CM | POA: Diagnosis not present

## 2019-09-25 DIAGNOSIS — N2581 Secondary hyperparathyroidism of renal origin: Secondary | ICD-10-CM | POA: Diagnosis not present

## 2019-09-25 DIAGNOSIS — D631 Anemia in chronic kidney disease: Secondary | ICD-10-CM | POA: Diagnosis not present

## 2019-09-25 DIAGNOSIS — E876 Hypokalemia: Secondary | ICD-10-CM | POA: Diagnosis not present

## 2019-09-25 DIAGNOSIS — D689 Coagulation defect, unspecified: Secondary | ICD-10-CM | POA: Diagnosis not present

## 2019-09-25 DIAGNOSIS — D509 Iron deficiency anemia, unspecified: Secondary | ICD-10-CM | POA: Diagnosis not present

## 2019-09-25 DIAGNOSIS — Z992 Dependence on renal dialysis: Secondary | ICD-10-CM | POA: Diagnosis not present

## 2019-09-27 DIAGNOSIS — D631 Anemia in chronic kidney disease: Secondary | ICD-10-CM | POA: Diagnosis not present

## 2019-09-27 DIAGNOSIS — E876 Hypokalemia: Secondary | ICD-10-CM | POA: Diagnosis not present

## 2019-09-27 DIAGNOSIS — N2581 Secondary hyperparathyroidism of renal origin: Secondary | ICD-10-CM | POA: Diagnosis not present

## 2019-09-27 DIAGNOSIS — N186 End stage renal disease: Secondary | ICD-10-CM | POA: Diagnosis not present

## 2019-09-27 DIAGNOSIS — D689 Coagulation defect, unspecified: Secondary | ICD-10-CM | POA: Diagnosis not present

## 2019-09-27 DIAGNOSIS — Z992 Dependence on renal dialysis: Secondary | ICD-10-CM | POA: Diagnosis not present

## 2019-09-27 DIAGNOSIS — D509 Iron deficiency anemia, unspecified: Secondary | ICD-10-CM | POA: Diagnosis not present

## 2019-09-30 DIAGNOSIS — D631 Anemia in chronic kidney disease: Secondary | ICD-10-CM | POA: Diagnosis not present

## 2019-09-30 DIAGNOSIS — D689 Coagulation defect, unspecified: Secondary | ICD-10-CM | POA: Diagnosis not present

## 2019-09-30 DIAGNOSIS — Z992 Dependence on renal dialysis: Secondary | ICD-10-CM | POA: Diagnosis not present

## 2019-09-30 DIAGNOSIS — N2581 Secondary hyperparathyroidism of renal origin: Secondary | ICD-10-CM | POA: Diagnosis not present

## 2019-09-30 DIAGNOSIS — N186 End stage renal disease: Secondary | ICD-10-CM | POA: Diagnosis not present

## 2019-09-30 DIAGNOSIS — E876 Hypokalemia: Secondary | ICD-10-CM | POA: Diagnosis not present

## 2019-09-30 DIAGNOSIS — D509 Iron deficiency anemia, unspecified: Secondary | ICD-10-CM | POA: Diagnosis not present

## 2019-10-02 DIAGNOSIS — D509 Iron deficiency anemia, unspecified: Secondary | ICD-10-CM | POA: Diagnosis not present

## 2019-10-02 DIAGNOSIS — Z992 Dependence on renal dialysis: Secondary | ICD-10-CM | POA: Diagnosis not present

## 2019-10-02 DIAGNOSIS — D631 Anemia in chronic kidney disease: Secondary | ICD-10-CM | POA: Diagnosis not present

## 2019-10-02 DIAGNOSIS — N186 End stage renal disease: Secondary | ICD-10-CM | POA: Diagnosis not present

## 2019-10-02 DIAGNOSIS — E876 Hypokalemia: Secondary | ICD-10-CM | POA: Diagnosis not present

## 2019-10-02 DIAGNOSIS — N2581 Secondary hyperparathyroidism of renal origin: Secondary | ICD-10-CM | POA: Diagnosis not present

## 2019-10-02 DIAGNOSIS — D689 Coagulation defect, unspecified: Secondary | ICD-10-CM | POA: Diagnosis not present

## 2019-10-04 DIAGNOSIS — N2581 Secondary hyperparathyroidism of renal origin: Secondary | ICD-10-CM | POA: Diagnosis not present

## 2019-10-04 DIAGNOSIS — Z992 Dependence on renal dialysis: Secondary | ICD-10-CM | POA: Diagnosis not present

## 2019-10-04 DIAGNOSIS — D689 Coagulation defect, unspecified: Secondary | ICD-10-CM | POA: Diagnosis not present

## 2019-10-04 DIAGNOSIS — D509 Iron deficiency anemia, unspecified: Secondary | ICD-10-CM | POA: Diagnosis not present

## 2019-10-04 DIAGNOSIS — D631 Anemia in chronic kidney disease: Secondary | ICD-10-CM | POA: Diagnosis not present

## 2019-10-04 DIAGNOSIS — E876 Hypokalemia: Secondary | ICD-10-CM | POA: Diagnosis not present

## 2019-10-04 DIAGNOSIS — N186 End stage renal disease: Secondary | ICD-10-CM | POA: Diagnosis not present

## 2019-10-06 DIAGNOSIS — N2581 Secondary hyperparathyroidism of renal origin: Secondary | ICD-10-CM | POA: Diagnosis not present

## 2019-10-06 DIAGNOSIS — D631 Anemia in chronic kidney disease: Secondary | ICD-10-CM | POA: Diagnosis not present

## 2019-10-06 DIAGNOSIS — Z992 Dependence on renal dialysis: Secondary | ICD-10-CM | POA: Diagnosis not present

## 2019-10-06 DIAGNOSIS — N186 End stage renal disease: Secondary | ICD-10-CM | POA: Diagnosis not present

## 2019-10-06 DIAGNOSIS — E876 Hypokalemia: Secondary | ICD-10-CM | POA: Diagnosis not present

## 2019-10-06 DIAGNOSIS — D689 Coagulation defect, unspecified: Secondary | ICD-10-CM | POA: Diagnosis not present

## 2019-10-06 DIAGNOSIS — D509 Iron deficiency anemia, unspecified: Secondary | ICD-10-CM | POA: Diagnosis not present

## 2019-10-08 DIAGNOSIS — D631 Anemia in chronic kidney disease: Secondary | ICD-10-CM | POA: Diagnosis not present

## 2019-10-08 DIAGNOSIS — D689 Coagulation defect, unspecified: Secondary | ICD-10-CM | POA: Diagnosis not present

## 2019-10-08 DIAGNOSIS — E876 Hypokalemia: Secondary | ICD-10-CM | POA: Diagnosis not present

## 2019-10-08 DIAGNOSIS — N2581 Secondary hyperparathyroidism of renal origin: Secondary | ICD-10-CM | POA: Diagnosis not present

## 2019-10-08 DIAGNOSIS — Z992 Dependence on renal dialysis: Secondary | ICD-10-CM | POA: Diagnosis not present

## 2019-10-08 DIAGNOSIS — N186 End stage renal disease: Secondary | ICD-10-CM | POA: Diagnosis not present

## 2019-10-08 DIAGNOSIS — D509 Iron deficiency anemia, unspecified: Secondary | ICD-10-CM | POA: Diagnosis not present

## 2019-10-11 DIAGNOSIS — N186 End stage renal disease: Secondary | ICD-10-CM | POA: Diagnosis not present

## 2019-10-11 DIAGNOSIS — E876 Hypokalemia: Secondary | ICD-10-CM | POA: Diagnosis not present

## 2019-10-11 DIAGNOSIS — D509 Iron deficiency anemia, unspecified: Secondary | ICD-10-CM | POA: Diagnosis not present

## 2019-10-11 DIAGNOSIS — D689 Coagulation defect, unspecified: Secondary | ICD-10-CM | POA: Diagnosis not present

## 2019-10-11 DIAGNOSIS — N2581 Secondary hyperparathyroidism of renal origin: Secondary | ICD-10-CM | POA: Diagnosis not present

## 2019-10-11 DIAGNOSIS — Z992 Dependence on renal dialysis: Secondary | ICD-10-CM | POA: Diagnosis not present

## 2019-10-11 DIAGNOSIS — D631 Anemia in chronic kidney disease: Secondary | ICD-10-CM | POA: Diagnosis not present

## 2019-10-14 DIAGNOSIS — N2581 Secondary hyperparathyroidism of renal origin: Secondary | ICD-10-CM | POA: Diagnosis not present

## 2019-10-14 DIAGNOSIS — N186 End stage renal disease: Secondary | ICD-10-CM | POA: Diagnosis not present

## 2019-10-14 DIAGNOSIS — Z992 Dependence on renal dialysis: Secondary | ICD-10-CM | POA: Diagnosis not present

## 2019-10-14 DIAGNOSIS — D631 Anemia in chronic kidney disease: Secondary | ICD-10-CM | POA: Diagnosis not present

## 2019-10-14 DIAGNOSIS — E876 Hypokalemia: Secondary | ICD-10-CM | POA: Diagnosis not present

## 2019-10-14 DIAGNOSIS — D689 Coagulation defect, unspecified: Secondary | ICD-10-CM | POA: Diagnosis not present

## 2019-10-16 DIAGNOSIS — E876 Hypokalemia: Secondary | ICD-10-CM | POA: Diagnosis not present

## 2019-10-16 DIAGNOSIS — N2581 Secondary hyperparathyroidism of renal origin: Secondary | ICD-10-CM | POA: Diagnosis not present

## 2019-10-16 DIAGNOSIS — D631 Anemia in chronic kidney disease: Secondary | ICD-10-CM | POA: Diagnosis not present

## 2019-10-16 DIAGNOSIS — D689 Coagulation defect, unspecified: Secondary | ICD-10-CM | POA: Diagnosis not present

## 2019-10-16 DIAGNOSIS — Z992 Dependence on renal dialysis: Secondary | ICD-10-CM | POA: Diagnosis not present

## 2019-10-16 DIAGNOSIS — N186 End stage renal disease: Secondary | ICD-10-CM | POA: Diagnosis not present

## 2019-10-18 DIAGNOSIS — N186 End stage renal disease: Secondary | ICD-10-CM | POA: Diagnosis not present

## 2019-10-18 DIAGNOSIS — D631 Anemia in chronic kidney disease: Secondary | ICD-10-CM | POA: Diagnosis not present

## 2019-10-18 DIAGNOSIS — Z992 Dependence on renal dialysis: Secondary | ICD-10-CM | POA: Diagnosis not present

## 2019-10-18 DIAGNOSIS — N2581 Secondary hyperparathyroidism of renal origin: Secondary | ICD-10-CM | POA: Diagnosis not present

## 2019-10-18 DIAGNOSIS — D689 Coagulation defect, unspecified: Secondary | ICD-10-CM | POA: Diagnosis not present

## 2019-10-18 DIAGNOSIS — E876 Hypokalemia: Secondary | ICD-10-CM | POA: Diagnosis not present

## 2019-10-21 DIAGNOSIS — D631 Anemia in chronic kidney disease: Secondary | ICD-10-CM | POA: Diagnosis not present

## 2019-10-21 DIAGNOSIS — E876 Hypokalemia: Secondary | ICD-10-CM | POA: Diagnosis not present

## 2019-10-21 DIAGNOSIS — N2581 Secondary hyperparathyroidism of renal origin: Secondary | ICD-10-CM | POA: Diagnosis not present

## 2019-10-21 DIAGNOSIS — N186 End stage renal disease: Secondary | ICD-10-CM | POA: Diagnosis not present

## 2019-10-21 DIAGNOSIS — D689 Coagulation defect, unspecified: Secondary | ICD-10-CM | POA: Diagnosis not present

## 2019-10-21 DIAGNOSIS — Z992 Dependence on renal dialysis: Secondary | ICD-10-CM | POA: Diagnosis not present

## 2019-10-22 ENCOUNTER — Encounter: Payer: Self-pay | Admitting: *Deleted

## 2019-10-23 DIAGNOSIS — D689 Coagulation defect, unspecified: Secondary | ICD-10-CM | POA: Diagnosis not present

## 2019-10-23 DIAGNOSIS — Z992 Dependence on renal dialysis: Secondary | ICD-10-CM | POA: Diagnosis not present

## 2019-10-23 DIAGNOSIS — N2581 Secondary hyperparathyroidism of renal origin: Secondary | ICD-10-CM | POA: Diagnosis not present

## 2019-10-23 DIAGNOSIS — D631 Anemia in chronic kidney disease: Secondary | ICD-10-CM | POA: Diagnosis not present

## 2019-10-23 DIAGNOSIS — E876 Hypokalemia: Secondary | ICD-10-CM | POA: Diagnosis not present

## 2019-10-23 DIAGNOSIS — N186 End stage renal disease: Secondary | ICD-10-CM | POA: Diagnosis not present

## 2019-10-25 DIAGNOSIS — D689 Coagulation defect, unspecified: Secondary | ICD-10-CM | POA: Diagnosis not present

## 2019-10-25 DIAGNOSIS — D631 Anemia in chronic kidney disease: Secondary | ICD-10-CM | POA: Diagnosis not present

## 2019-10-25 DIAGNOSIS — N2581 Secondary hyperparathyroidism of renal origin: Secondary | ICD-10-CM | POA: Diagnosis not present

## 2019-10-25 DIAGNOSIS — E876 Hypokalemia: Secondary | ICD-10-CM | POA: Diagnosis not present

## 2019-10-25 DIAGNOSIS — Z992 Dependence on renal dialysis: Secondary | ICD-10-CM | POA: Diagnosis not present

## 2019-10-25 DIAGNOSIS — N186 End stage renal disease: Secondary | ICD-10-CM | POA: Diagnosis not present

## 2019-10-28 DIAGNOSIS — D631 Anemia in chronic kidney disease: Secondary | ICD-10-CM | POA: Diagnosis not present

## 2019-10-28 DIAGNOSIS — N2581 Secondary hyperparathyroidism of renal origin: Secondary | ICD-10-CM | POA: Diagnosis not present

## 2019-10-28 DIAGNOSIS — N186 End stage renal disease: Secondary | ICD-10-CM | POA: Diagnosis not present

## 2019-10-28 DIAGNOSIS — E876 Hypokalemia: Secondary | ICD-10-CM | POA: Diagnosis not present

## 2019-10-28 DIAGNOSIS — Z992 Dependence on renal dialysis: Secondary | ICD-10-CM | POA: Diagnosis not present

## 2019-10-28 DIAGNOSIS — D689 Coagulation defect, unspecified: Secondary | ICD-10-CM | POA: Diagnosis not present

## 2019-10-30 DIAGNOSIS — D689 Coagulation defect, unspecified: Secondary | ICD-10-CM | POA: Diagnosis not present

## 2019-10-30 DIAGNOSIS — N186 End stage renal disease: Secondary | ICD-10-CM | POA: Diagnosis not present

## 2019-10-30 DIAGNOSIS — D631 Anemia in chronic kidney disease: Secondary | ICD-10-CM | POA: Diagnosis not present

## 2019-10-30 DIAGNOSIS — N2581 Secondary hyperparathyroidism of renal origin: Secondary | ICD-10-CM | POA: Diagnosis not present

## 2019-10-30 DIAGNOSIS — E876 Hypokalemia: Secondary | ICD-10-CM | POA: Diagnosis not present

## 2019-10-30 DIAGNOSIS — Z992 Dependence on renal dialysis: Secondary | ICD-10-CM | POA: Diagnosis not present

## 2019-11-01 DIAGNOSIS — N2581 Secondary hyperparathyroidism of renal origin: Secondary | ICD-10-CM | POA: Diagnosis not present

## 2019-11-01 DIAGNOSIS — E876 Hypokalemia: Secondary | ICD-10-CM | POA: Diagnosis not present

## 2019-11-01 DIAGNOSIS — D631 Anemia in chronic kidney disease: Secondary | ICD-10-CM | POA: Diagnosis not present

## 2019-11-01 DIAGNOSIS — N186 End stage renal disease: Secondary | ICD-10-CM | POA: Diagnosis not present

## 2019-11-01 DIAGNOSIS — D689 Coagulation defect, unspecified: Secondary | ICD-10-CM | POA: Diagnosis not present

## 2019-11-01 DIAGNOSIS — Z992 Dependence on renal dialysis: Secondary | ICD-10-CM | POA: Diagnosis not present

## 2019-11-04 DIAGNOSIS — Z992 Dependence on renal dialysis: Secondary | ICD-10-CM | POA: Diagnosis not present

## 2019-11-04 DIAGNOSIS — N186 End stage renal disease: Secondary | ICD-10-CM | POA: Diagnosis not present

## 2019-11-04 DIAGNOSIS — E876 Hypokalemia: Secondary | ICD-10-CM | POA: Diagnosis not present

## 2019-11-04 DIAGNOSIS — N2581 Secondary hyperparathyroidism of renal origin: Secondary | ICD-10-CM | POA: Diagnosis not present

## 2019-11-04 DIAGNOSIS — D689 Coagulation defect, unspecified: Secondary | ICD-10-CM | POA: Diagnosis not present

## 2019-11-04 DIAGNOSIS — D631 Anemia in chronic kidney disease: Secondary | ICD-10-CM | POA: Diagnosis not present

## 2019-11-06 DIAGNOSIS — E876 Hypokalemia: Secondary | ICD-10-CM | POA: Diagnosis not present

## 2019-11-06 DIAGNOSIS — N186 End stage renal disease: Secondary | ICD-10-CM | POA: Diagnosis not present

## 2019-11-06 DIAGNOSIS — D631 Anemia in chronic kidney disease: Secondary | ICD-10-CM | POA: Diagnosis not present

## 2019-11-06 DIAGNOSIS — Z992 Dependence on renal dialysis: Secondary | ICD-10-CM | POA: Diagnosis not present

## 2019-11-06 DIAGNOSIS — N2581 Secondary hyperparathyroidism of renal origin: Secondary | ICD-10-CM | POA: Diagnosis not present

## 2019-11-06 DIAGNOSIS — D689 Coagulation defect, unspecified: Secondary | ICD-10-CM | POA: Diagnosis not present

## 2019-11-09 DIAGNOSIS — D631 Anemia in chronic kidney disease: Secondary | ICD-10-CM | POA: Diagnosis not present

## 2019-11-09 DIAGNOSIS — N186 End stage renal disease: Secondary | ICD-10-CM | POA: Diagnosis not present

## 2019-11-09 DIAGNOSIS — Z992 Dependence on renal dialysis: Secondary | ICD-10-CM | POA: Diagnosis not present

## 2019-11-09 DIAGNOSIS — N2581 Secondary hyperparathyroidism of renal origin: Secondary | ICD-10-CM | POA: Diagnosis not present

## 2019-11-09 DIAGNOSIS — E876 Hypokalemia: Secondary | ICD-10-CM | POA: Diagnosis not present

## 2019-11-09 DIAGNOSIS — D689 Coagulation defect, unspecified: Secondary | ICD-10-CM | POA: Diagnosis not present

## 2019-11-10 DIAGNOSIS — E039 Hypothyroidism, unspecified: Secondary | ICD-10-CM | POA: Diagnosis not present

## 2019-11-11 DIAGNOSIS — N2581 Secondary hyperparathyroidism of renal origin: Secondary | ICD-10-CM | POA: Diagnosis not present

## 2019-11-11 DIAGNOSIS — E876 Hypokalemia: Secondary | ICD-10-CM | POA: Diagnosis not present

## 2019-11-11 DIAGNOSIS — D631 Anemia in chronic kidney disease: Secondary | ICD-10-CM | POA: Diagnosis not present

## 2019-11-11 DIAGNOSIS — Z992 Dependence on renal dialysis: Secondary | ICD-10-CM | POA: Diagnosis not present

## 2019-11-11 DIAGNOSIS — D689 Coagulation defect, unspecified: Secondary | ICD-10-CM | POA: Diagnosis not present

## 2019-11-11 DIAGNOSIS — N186 End stage renal disease: Secondary | ICD-10-CM | POA: Diagnosis not present

## 2019-11-13 DIAGNOSIS — D631 Anemia in chronic kidney disease: Secondary | ICD-10-CM | POA: Diagnosis not present

## 2019-11-13 DIAGNOSIS — N2581 Secondary hyperparathyroidism of renal origin: Secondary | ICD-10-CM | POA: Diagnosis not present

## 2019-11-13 DIAGNOSIS — E876 Hypokalemia: Secondary | ICD-10-CM | POA: Diagnosis not present

## 2019-11-13 DIAGNOSIS — N186 End stage renal disease: Secondary | ICD-10-CM | POA: Diagnosis not present

## 2019-11-13 DIAGNOSIS — Z992 Dependence on renal dialysis: Secondary | ICD-10-CM | POA: Diagnosis not present

## 2019-11-13 DIAGNOSIS — D689 Coagulation defect, unspecified: Secondary | ICD-10-CM | POA: Diagnosis not present

## 2019-11-13 DIAGNOSIS — I129 Hypertensive chronic kidney disease with stage 1 through stage 4 chronic kidney disease, or unspecified chronic kidney disease: Secondary | ICD-10-CM | POA: Diagnosis not present

## 2019-11-13 DIAGNOSIS — N189 Chronic kidney disease, unspecified: Secondary | ICD-10-CM | POA: Diagnosis not present

## 2019-11-16 DIAGNOSIS — Z23 Encounter for immunization: Secondary | ICD-10-CM | POA: Diagnosis not present

## 2019-11-16 DIAGNOSIS — E039 Hypothyroidism, unspecified: Secondary | ICD-10-CM | POA: Diagnosis not present

## 2019-11-16 DIAGNOSIS — D631 Anemia in chronic kidney disease: Secondary | ICD-10-CM | POA: Diagnosis not present

## 2019-11-16 DIAGNOSIS — E876 Hypokalemia: Secondary | ICD-10-CM | POA: Diagnosis not present

## 2019-11-16 DIAGNOSIS — N2581 Secondary hyperparathyroidism of renal origin: Secondary | ICD-10-CM | POA: Diagnosis not present

## 2019-11-16 DIAGNOSIS — D689 Coagulation defect, unspecified: Secondary | ICD-10-CM | POA: Diagnosis not present

## 2019-11-16 DIAGNOSIS — N186 End stage renal disease: Secondary | ICD-10-CM | POA: Diagnosis not present

## 2019-11-16 DIAGNOSIS — D509 Iron deficiency anemia, unspecified: Secondary | ICD-10-CM | POA: Diagnosis not present

## 2019-11-16 DIAGNOSIS — Z992 Dependence on renal dialysis: Secondary | ICD-10-CM | POA: Diagnosis not present

## 2019-11-17 DIAGNOSIS — G2581 Restless legs syndrome: Secondary | ICD-10-CM | POA: Diagnosis not present

## 2019-11-17 DIAGNOSIS — I10 Essential (primary) hypertension: Secondary | ICD-10-CM | POA: Diagnosis not present

## 2019-11-17 DIAGNOSIS — K219 Gastro-esophageal reflux disease without esophagitis: Secondary | ICD-10-CM | POA: Diagnosis not present

## 2019-11-17 DIAGNOSIS — E039 Hypothyroidism, unspecified: Secondary | ICD-10-CM | POA: Diagnosis not present

## 2019-11-18 DIAGNOSIS — E039 Hypothyroidism, unspecified: Secondary | ICD-10-CM | POA: Diagnosis not present

## 2019-11-18 DIAGNOSIS — D689 Coagulation defect, unspecified: Secondary | ICD-10-CM | POA: Diagnosis not present

## 2019-11-18 DIAGNOSIS — Z992 Dependence on renal dialysis: Secondary | ICD-10-CM | POA: Diagnosis not present

## 2019-11-18 DIAGNOSIS — D509 Iron deficiency anemia, unspecified: Secondary | ICD-10-CM | POA: Diagnosis not present

## 2019-11-18 DIAGNOSIS — D631 Anemia in chronic kidney disease: Secondary | ICD-10-CM | POA: Diagnosis not present

## 2019-11-18 DIAGNOSIS — N2581 Secondary hyperparathyroidism of renal origin: Secondary | ICD-10-CM | POA: Diagnosis not present

## 2019-11-18 DIAGNOSIS — E876 Hypokalemia: Secondary | ICD-10-CM | POA: Diagnosis not present

## 2019-11-18 DIAGNOSIS — N186 End stage renal disease: Secondary | ICD-10-CM | POA: Diagnosis not present

## 2019-11-18 DIAGNOSIS — Z23 Encounter for immunization: Secondary | ICD-10-CM | POA: Diagnosis not present

## 2019-11-20 DIAGNOSIS — N2581 Secondary hyperparathyroidism of renal origin: Secondary | ICD-10-CM | POA: Diagnosis not present

## 2019-11-20 DIAGNOSIS — E039 Hypothyroidism, unspecified: Secondary | ICD-10-CM | POA: Diagnosis not present

## 2019-11-20 DIAGNOSIS — D509 Iron deficiency anemia, unspecified: Secondary | ICD-10-CM | POA: Diagnosis not present

## 2019-11-20 DIAGNOSIS — D631 Anemia in chronic kidney disease: Secondary | ICD-10-CM | POA: Diagnosis not present

## 2019-11-20 DIAGNOSIS — E876 Hypokalemia: Secondary | ICD-10-CM | POA: Diagnosis not present

## 2019-11-20 DIAGNOSIS — D689 Coagulation defect, unspecified: Secondary | ICD-10-CM | POA: Diagnosis not present

## 2019-11-20 DIAGNOSIS — Z23 Encounter for immunization: Secondary | ICD-10-CM | POA: Diagnosis not present

## 2019-11-20 DIAGNOSIS — N186 End stage renal disease: Secondary | ICD-10-CM | POA: Diagnosis not present

## 2019-11-20 DIAGNOSIS — Z992 Dependence on renal dialysis: Secondary | ICD-10-CM | POA: Diagnosis not present

## 2019-11-22 DIAGNOSIS — E876 Hypokalemia: Secondary | ICD-10-CM | POA: Diagnosis not present

## 2019-11-22 DIAGNOSIS — E039 Hypothyroidism, unspecified: Secondary | ICD-10-CM | POA: Diagnosis not present

## 2019-11-22 DIAGNOSIS — D509 Iron deficiency anemia, unspecified: Secondary | ICD-10-CM | POA: Diagnosis not present

## 2019-11-22 DIAGNOSIS — D631 Anemia in chronic kidney disease: Secondary | ICD-10-CM | POA: Diagnosis not present

## 2019-11-22 DIAGNOSIS — Z992 Dependence on renal dialysis: Secondary | ICD-10-CM | POA: Diagnosis not present

## 2019-11-22 DIAGNOSIS — Z23 Encounter for immunization: Secondary | ICD-10-CM | POA: Diagnosis not present

## 2019-11-22 DIAGNOSIS — N2581 Secondary hyperparathyroidism of renal origin: Secondary | ICD-10-CM | POA: Diagnosis not present

## 2019-11-22 DIAGNOSIS — N186 End stage renal disease: Secondary | ICD-10-CM | POA: Diagnosis not present

## 2019-11-22 DIAGNOSIS — D689 Coagulation defect, unspecified: Secondary | ICD-10-CM | POA: Diagnosis not present

## 2019-11-25 DIAGNOSIS — E876 Hypokalemia: Secondary | ICD-10-CM | POA: Diagnosis not present

## 2019-11-25 DIAGNOSIS — D689 Coagulation defect, unspecified: Secondary | ICD-10-CM | POA: Diagnosis not present

## 2019-11-25 DIAGNOSIS — E039 Hypothyroidism, unspecified: Secondary | ICD-10-CM | POA: Diagnosis not present

## 2019-11-25 DIAGNOSIS — Z992 Dependence on renal dialysis: Secondary | ICD-10-CM | POA: Diagnosis not present

## 2019-11-25 DIAGNOSIS — D631 Anemia in chronic kidney disease: Secondary | ICD-10-CM | POA: Diagnosis not present

## 2019-11-25 DIAGNOSIS — N2581 Secondary hyperparathyroidism of renal origin: Secondary | ICD-10-CM | POA: Diagnosis not present

## 2019-11-25 DIAGNOSIS — D509 Iron deficiency anemia, unspecified: Secondary | ICD-10-CM | POA: Diagnosis not present

## 2019-11-25 DIAGNOSIS — Z23 Encounter for immunization: Secondary | ICD-10-CM | POA: Diagnosis not present

## 2019-11-25 DIAGNOSIS — N186 End stage renal disease: Secondary | ICD-10-CM | POA: Diagnosis not present

## 2019-11-27 DIAGNOSIS — Z992 Dependence on renal dialysis: Secondary | ICD-10-CM | POA: Diagnosis not present

## 2019-11-27 DIAGNOSIS — D631 Anemia in chronic kidney disease: Secondary | ICD-10-CM | POA: Diagnosis not present

## 2019-11-27 DIAGNOSIS — E876 Hypokalemia: Secondary | ICD-10-CM | POA: Diagnosis not present

## 2019-11-27 DIAGNOSIS — D689 Coagulation defect, unspecified: Secondary | ICD-10-CM | POA: Diagnosis not present

## 2019-11-27 DIAGNOSIS — D509 Iron deficiency anemia, unspecified: Secondary | ICD-10-CM | POA: Diagnosis not present

## 2019-11-27 DIAGNOSIS — N2581 Secondary hyperparathyroidism of renal origin: Secondary | ICD-10-CM | POA: Diagnosis not present

## 2019-11-27 DIAGNOSIS — Z23 Encounter for immunization: Secondary | ICD-10-CM | POA: Diagnosis not present

## 2019-11-27 DIAGNOSIS — E039 Hypothyroidism, unspecified: Secondary | ICD-10-CM | POA: Diagnosis not present

## 2019-11-27 DIAGNOSIS — N186 End stage renal disease: Secondary | ICD-10-CM | POA: Diagnosis not present

## 2019-11-29 DIAGNOSIS — E039 Hypothyroidism, unspecified: Secondary | ICD-10-CM | POA: Diagnosis not present

## 2019-11-29 DIAGNOSIS — N2581 Secondary hyperparathyroidism of renal origin: Secondary | ICD-10-CM | POA: Diagnosis not present

## 2019-11-29 DIAGNOSIS — D689 Coagulation defect, unspecified: Secondary | ICD-10-CM | POA: Diagnosis not present

## 2019-11-29 DIAGNOSIS — D509 Iron deficiency anemia, unspecified: Secondary | ICD-10-CM | POA: Diagnosis not present

## 2019-11-29 DIAGNOSIS — E876 Hypokalemia: Secondary | ICD-10-CM | POA: Diagnosis not present

## 2019-11-29 DIAGNOSIS — N186 End stage renal disease: Secondary | ICD-10-CM | POA: Diagnosis not present

## 2019-11-29 DIAGNOSIS — Z992 Dependence on renal dialysis: Secondary | ICD-10-CM | POA: Diagnosis not present

## 2019-11-29 DIAGNOSIS — Z23 Encounter for immunization: Secondary | ICD-10-CM | POA: Diagnosis not present

## 2019-11-29 DIAGNOSIS — D631 Anemia in chronic kidney disease: Secondary | ICD-10-CM | POA: Diagnosis not present

## 2019-12-02 DIAGNOSIS — D689 Coagulation defect, unspecified: Secondary | ICD-10-CM | POA: Diagnosis not present

## 2019-12-02 DIAGNOSIS — N186 End stage renal disease: Secondary | ICD-10-CM | POA: Diagnosis not present

## 2019-12-02 DIAGNOSIS — Z992 Dependence on renal dialysis: Secondary | ICD-10-CM | POA: Diagnosis not present

## 2019-12-02 DIAGNOSIS — D631 Anemia in chronic kidney disease: Secondary | ICD-10-CM | POA: Diagnosis not present

## 2019-12-02 DIAGNOSIS — E039 Hypothyroidism, unspecified: Secondary | ICD-10-CM | POA: Diagnosis not present

## 2019-12-02 DIAGNOSIS — N2581 Secondary hyperparathyroidism of renal origin: Secondary | ICD-10-CM | POA: Diagnosis not present

## 2019-12-02 DIAGNOSIS — Z23 Encounter for immunization: Secondary | ICD-10-CM | POA: Diagnosis not present

## 2019-12-02 DIAGNOSIS — E876 Hypokalemia: Secondary | ICD-10-CM | POA: Diagnosis not present

## 2019-12-02 DIAGNOSIS — D509 Iron deficiency anemia, unspecified: Secondary | ICD-10-CM | POA: Diagnosis not present

## 2019-12-04 DIAGNOSIS — Z992 Dependence on renal dialysis: Secondary | ICD-10-CM | POA: Diagnosis not present

## 2019-12-04 DIAGNOSIS — D631 Anemia in chronic kidney disease: Secondary | ICD-10-CM | POA: Diagnosis not present

## 2019-12-04 DIAGNOSIS — D689 Coagulation defect, unspecified: Secondary | ICD-10-CM | POA: Diagnosis not present

## 2019-12-04 DIAGNOSIS — D509 Iron deficiency anemia, unspecified: Secondary | ICD-10-CM | POA: Diagnosis not present

## 2019-12-04 DIAGNOSIS — N2581 Secondary hyperparathyroidism of renal origin: Secondary | ICD-10-CM | POA: Diagnosis not present

## 2019-12-04 DIAGNOSIS — Z23 Encounter for immunization: Secondary | ICD-10-CM | POA: Diagnosis not present

## 2019-12-04 DIAGNOSIS — N186 End stage renal disease: Secondary | ICD-10-CM | POA: Diagnosis not present

## 2019-12-04 DIAGNOSIS — E876 Hypokalemia: Secondary | ICD-10-CM | POA: Diagnosis not present

## 2019-12-04 DIAGNOSIS — E039 Hypothyroidism, unspecified: Secondary | ICD-10-CM | POA: Diagnosis not present

## 2019-12-05 ENCOUNTER — Encounter (HOSPITAL_COMMUNITY): Payer: Self-pay

## 2019-12-05 ENCOUNTER — Other Ambulatory Visit: Payer: Self-pay

## 2019-12-05 ENCOUNTER — Ambulatory Visit (HOSPITAL_COMMUNITY)
Admission: RE | Admit: 2019-12-05 | Discharge: 2019-12-05 | Disposition: A | Discharge status: Home / Self Care | Payer: Medicare Other | Source: Ambulatory Visit | Attending: Physician Assistant | Admitting: Physician Assistant

## 2019-12-05 ENCOUNTER — Inpatient Hospital Stay: Payer: Medicare Other | Attending: Internal Medicine

## 2019-12-05 DIAGNOSIS — I712 Thoracic aortic aneurysm, without rupture: Secondary | ICD-10-CM | POA: Diagnosis not present

## 2019-12-05 DIAGNOSIS — Z79899 Other long term (current) drug therapy: Secondary | ICD-10-CM | POA: Insufficient documentation

## 2019-12-05 DIAGNOSIS — J439 Emphysema, unspecified: Secondary | ICD-10-CM | POA: Insufficient documentation

## 2019-12-05 DIAGNOSIS — I2510 Atherosclerotic heart disease of native coronary artery without angina pectoris: Secondary | ICD-10-CM | POA: Diagnosis not present

## 2019-12-05 DIAGNOSIS — I700 Atherosclerosis of aorta: Secondary | ICD-10-CM | POA: Insufficient documentation

## 2019-12-05 DIAGNOSIS — C3411 Malignant neoplasm of upper lobe, right bronchus or lung: Secondary | ICD-10-CM

## 2019-12-05 DIAGNOSIS — N186 End stage renal disease: Secondary | ICD-10-CM | POA: Diagnosis not present

## 2019-12-05 DIAGNOSIS — I120 Hypertensive chronic kidney disease with stage 5 chronic kidney disease or end stage renal disease: Secondary | ICD-10-CM | POA: Insufficient documentation

## 2019-12-05 DIAGNOSIS — J90 Pleural effusion, not elsewhere classified: Secondary | ICD-10-CM | POA: Insufficient documentation

## 2019-12-05 DIAGNOSIS — D61818 Other pancytopenia: Secondary | ICD-10-CM | POA: Insufficient documentation

## 2019-12-05 DIAGNOSIS — R590 Localized enlarged lymph nodes: Secondary | ICD-10-CM | POA: Insufficient documentation

## 2019-12-05 DIAGNOSIS — C3490 Malignant neoplasm of unspecified part of unspecified bronchus or lung: Secondary | ICD-10-CM | POA: Diagnosis not present

## 2019-12-05 DIAGNOSIS — Z880 Allergy status to penicillin: Secondary | ICD-10-CM | POA: Insufficient documentation

## 2019-12-05 DIAGNOSIS — I252 Old myocardial infarction: Secondary | ICD-10-CM | POA: Insufficient documentation

## 2019-12-05 DIAGNOSIS — Z992 Dependence on renal dialysis: Secondary | ICD-10-CM | POA: Insufficient documentation

## 2019-12-05 DIAGNOSIS — E039 Hypothyroidism, unspecified: Secondary | ICD-10-CM | POA: Insufficient documentation

## 2019-12-05 LAB — CMP (CANCER CENTER ONLY)
ALT: 9 U/L (ref 0–44)
AST: 17 U/L (ref 15–41)
Albumin: 3.4 g/dL — ABNORMAL LOW (ref 3.5–5.0)
Alkaline Phosphatase: 81 U/L (ref 38–126)
Anion gap: 13 (ref 5–15)
BUN: 19 mg/dL (ref 8–23)
CO2: 31 mmol/L (ref 22–32)
Calcium: 9.1 mg/dL (ref 8.9–10.3)
Chloride: 95 mmol/L — ABNORMAL LOW (ref 98–111)
Creatinine: 6.85 mg/dL (ref 0.61–1.24)
GFR, Est AFR Am: 8 mL/min — ABNORMAL LOW (ref >60)
GFR, Estimated: 7 mL/min — ABNORMAL LOW (ref >60)
Glucose, Bld: 141 mg/dL — ABNORMAL HIGH (ref 70–99)
Potassium: 3.7 mmol/L (ref 3.5–5.1)
Sodium: 139 mmol/L (ref 135–145)
Total Bilirubin: 0.5 mg/dL (ref 0.3–1.2)
Total Protein: 7.6 g/dL (ref 6.5–8.1)

## 2019-12-05 LAB — CBC WITH DIFFERENTIAL (CANCER CENTER ONLY)
Abs Immature Granulocytes: 0.03 10*3/uL (ref 0.00–0.07)
Basophils Absolute: 0.1 10*3/uL (ref 0.0–0.1)
Basophils Relative: 1 %
Eosinophils Absolute: 0.2 10*3/uL (ref 0.0–0.5)
Eosinophils Relative: 3 %
HCT: 34.3 % — ABNORMAL LOW (ref 39.0–52.0)
Hemoglobin: 11.3 g/dL — ABNORMAL LOW (ref 13.0–17.0)
Immature Granulocytes: 0 %
Lymphocytes Relative: 26 %
Lymphs Abs: 2.1 10*3/uL (ref 0.7–4.0)
MCH: 30.7 pg (ref 26.0–34.0)
MCHC: 32.9 g/dL (ref 30.0–36.0)
MCV: 93.2 fL (ref 80.0–100.0)
Monocytes Absolute: 1 10*3/uL (ref 0.1–1.0)
Monocytes Relative: 13 %
Neutro Abs: 4.6 10*3/uL (ref 1.7–7.7)
Neutrophils Relative %: 57 %
Platelet Count: 196 10*3/uL (ref 150–400)
RBC: 3.68 MIL/uL — ABNORMAL LOW (ref 4.22–5.81)
RDW: 16.7 % — ABNORMAL HIGH (ref 11.5–15.5)
WBC Count: 7.9 10*3/uL (ref 4.0–10.5)
nRBC: 0 % (ref 0.0–0.2)

## 2019-12-06 DIAGNOSIS — D689 Coagulation defect, unspecified: Secondary | ICD-10-CM | POA: Diagnosis not present

## 2019-12-06 DIAGNOSIS — Z23 Encounter for immunization: Secondary | ICD-10-CM | POA: Diagnosis not present

## 2019-12-06 DIAGNOSIS — N186 End stage renal disease: Secondary | ICD-10-CM | POA: Diagnosis not present

## 2019-12-06 DIAGNOSIS — Z992 Dependence on renal dialysis: Secondary | ICD-10-CM | POA: Diagnosis not present

## 2019-12-06 DIAGNOSIS — N2581 Secondary hyperparathyroidism of renal origin: Secondary | ICD-10-CM | POA: Diagnosis not present

## 2019-12-06 DIAGNOSIS — E876 Hypokalemia: Secondary | ICD-10-CM | POA: Diagnosis not present

## 2019-12-06 DIAGNOSIS — D509 Iron deficiency anemia, unspecified: Secondary | ICD-10-CM | POA: Diagnosis not present

## 2019-12-06 DIAGNOSIS — E039 Hypothyroidism, unspecified: Secondary | ICD-10-CM | POA: Diagnosis not present

## 2019-12-06 DIAGNOSIS — D631 Anemia in chronic kidney disease: Secondary | ICD-10-CM | POA: Diagnosis not present

## 2019-12-08 ENCOUNTER — Other Ambulatory Visit: Payer: Self-pay

## 2019-12-08 ENCOUNTER — Inpatient Hospital Stay (HOSPITAL_BASED_OUTPATIENT_CLINIC_OR_DEPARTMENT_OTHER): Payer: Medicare Other | Admitting: Internal Medicine

## 2019-12-08 ENCOUNTER — Encounter: Payer: Self-pay | Admitting: Internal Medicine

## 2019-12-08 VITALS — BP 142/72 | HR 67 | Temp 99.2°F | Resp 18 | Ht 69.0 in | Wt 178.5 lb

## 2019-12-08 DIAGNOSIS — Z992 Dependence on renal dialysis: Secondary | ICD-10-CM | POA: Diagnosis not present

## 2019-12-08 DIAGNOSIS — J90 Pleural effusion, not elsewhere classified: Secondary | ICD-10-CM | POA: Diagnosis not present

## 2019-12-08 DIAGNOSIS — C3490 Malignant neoplasm of unspecified part of unspecified bronchus or lung: Secondary | ICD-10-CM

## 2019-12-08 DIAGNOSIS — I700 Atherosclerosis of aorta: Secondary | ICD-10-CM | POA: Diagnosis not present

## 2019-12-08 DIAGNOSIS — Z880 Allergy status to penicillin: Secondary | ICD-10-CM | POA: Diagnosis not present

## 2019-12-08 DIAGNOSIS — N186 End stage renal disease: Secondary | ICD-10-CM | POA: Diagnosis not present

## 2019-12-08 DIAGNOSIS — I712 Thoracic aortic aneurysm, without rupture: Secondary | ICD-10-CM | POA: Diagnosis not present

## 2019-12-08 DIAGNOSIS — C3411 Malignant neoplasm of upper lobe, right bronchus or lung: Secondary | ICD-10-CM

## 2019-12-08 DIAGNOSIS — D61818 Other pancytopenia: Secondary | ICD-10-CM | POA: Diagnosis not present

## 2019-12-08 DIAGNOSIS — R590 Localized enlarged lymph nodes: Secondary | ICD-10-CM | POA: Diagnosis not present

## 2019-12-08 DIAGNOSIS — E039 Hypothyroidism, unspecified: Secondary | ICD-10-CM

## 2019-12-08 DIAGNOSIS — I252 Old myocardial infarction: Secondary | ICD-10-CM | POA: Diagnosis not present

## 2019-12-08 DIAGNOSIS — I2510 Atherosclerotic heart disease of native coronary artery without angina pectoris: Secondary | ICD-10-CM | POA: Diagnosis not present

## 2019-12-08 DIAGNOSIS — I10 Essential (primary) hypertension: Secondary | ICD-10-CM | POA: Diagnosis not present

## 2019-12-08 DIAGNOSIS — Z79899 Other long term (current) drug therapy: Secondary | ICD-10-CM | POA: Diagnosis not present

## 2019-12-08 DIAGNOSIS — I120 Hypertensive chronic kidney disease with stage 5 chronic kidney disease or end stage renal disease: Secondary | ICD-10-CM | POA: Diagnosis not present

## 2019-12-08 DIAGNOSIS — J439 Emphysema, unspecified: Secondary | ICD-10-CM | POA: Diagnosis not present

## 2019-12-08 NOTE — Progress Notes (Signed)
[place] Telephone:(336) [phone]   Fax:(336) [phone]  OFFICE PROGRESS NOTE  [name], MD [address] [place] [address]  DIAGNOSIS: Limited stage (T2b, N1, M0) small cell lung cancer presented with right upper lobe with invasion of superior segment of right lower lobe as well as right hilar lymphadenopathy diagnosed in March 2020.  PRIOR THERAPY:  Systemic chemotherapy with carboplatin for AUC of 5 on day 1 and etoposide 100 mg/M2 on days 1, 2 and 3 every 3 weeks.  Status post 4 cycles.  CURRENT THERAPY: Observation.   INTERVAL HISTORY: Jeremy Johnson 74 y.o. male returns to the clinic today for follow-up visit.  His wife was available by phone during the visit.  The patient is feeling fine today with no concerning complaints.  He is currently undergoing hemodialysis on Tuesday, Thursday and Saturday every week.  The patient denied having any current chest pain, shortness of breath, cough or hemoptysis.  He denied having any fever or chills.  He has no nausea, vomiting, diarrhea or constipation.  He denied having any recent upper respiratory infection.  He had repeat CT scan of the chest performed recently and is here for evaluation and discussion of his discuss results.   MEDICAL HISTORY: Past Medical History:  Diagnosis Date   Anemia    Blood transfusion without reported diagnosis    CAD (coronary artery disease)    STENT... MID CIRCUMFLEX...1997   Chronic kidney disease    STAGE 3   COPD (chronic obstructive pulmonary disease) (HCC)    Degenerative joint disease (DJD) of lumbar spine    GERD (gastroesophageal reflux disease)    Gout    Hyperlipidemia    Hypertension    Hypothyroidism    Incisional hernia    abdomen   Leukocytosis    CHRONIC MILD   Myocardial infarction ([facility])    1997   SCL CA dx'd 01/2019   Lung cancer    ALLERGIES:  is allergic to penicillins.  MEDICATIONS:  Current Outpatient Medications    Medication Sig Dispense Refill   Methoxy PEG-Epoetin Beta (MIRCERA IJ) Mircera     acetaminophen (TYLENOL) 325 MG tablet Take 2 tablets (650 mg total) by mouth every 6 (six) hours as needed for mild pain (or Fever >/= 101). 30 tablet 0   allopurinol (ZYLOPRIM) 100 MG tablet Take 1 tablet (100 mg total) by mouth daily. 30 tablet 1   amLODipine (NORVASC) 10 MG tablet Take by mouth.     B Complex-C-Zn-Folic Acid (DIALYVITE [id number] 15) 0.8 MG TABS Take 1 tablet by mouth daily.     Darbepoetin Alfa (ARANESP) 100 MCG/0.5ML SOSY injection Inject 0.5 mLs (100 mcg total) into the vein every Thursday with hemodialysis. 4.2 mL    levocetirizine (XYZAL) 5 MG tablet Take 5 mg by mouth daily.     levothyroxine (SYNTHROID, LEVOTHROID) 175 MCG tablet Take 200 mcg by mouth daily before breakfast.      lidocaine-prilocaine (EMLA) cream      metoprolol tartrate (LOPRESSOR) 50 MG tablet Take 25 mg by mouth 2 (two) times daily.      omeprazole (PRILOSEC) 40 MG capsule      polyethylene glycol (MIRALAX / GLYCOLAX) 17 g packet Take 17 g by mouth daily. 14 each 0   simvastatin (ZOCOR) 20 MG tablet Take 20 mg by mouth at bedtime.      No current facility-administered medications for this visit.    SURGICAL HISTORY:  Past Surgical  History:  Procedure Laterality Date   ABDOMINAL AORTIC ANEURYSM REPAIR  2006   AV FISTULA PLACEMENT Left 04/25/2019   Procedure: ARTERIOVENOUS (AV) FISTULA CREATION LEFT ARM;  Surgeon: Angelia Mould, MD;  Location: [facility];  Service: Vascular;  Laterality: Left;   AV FISTULA PLACEMENT Left 05/19/2019   Procedure: CONVERSION OF LEFT ARM ARTERIOVENOUS FISTULA TO GRAFT;  Surgeon: Angelia Mould, MD;  Location: [facility];  Service: Vascular;  Laterality: Left;   BIOPSY  09/30/2018   Procedure: BIOPSY;  Surgeon: Danie Binder, MD;  [facility];  Service: Endoscopy;;  ascending colon   COLONOSCOPY  2008   COLONOSCOPY N/A 09/30/2018   Procedure:  COLONOSCOPY;  Surgeon: Danie Binder, MD;  [facility];  Service: Endoscopy;  Laterality: N/A;  9:00   CORONARY ANGIOPLASTY WITH STENT PLACEMENT  1997   MID CIRCUMFLEX   IR FLUORO GUIDE CV LINE RIGHT  04/22/2019   IR US GUIDE [place] RIGHT  04/22/2019   POLYPECTOMY  09/30/2018   Procedure: POLYPECTOMY;  Surgeon: Danie Binder, MD;  [facility];  Service: Endoscopy;;  colon   VIDEO BRONCHOSCOPY WITH ENDOBRONCHIAL NAVIGATION N/A 01/27/2019   Procedure: VIDEO BRONCHOSCOPY WITH ENDOBRONCHIAL NAVIGATION;  Surgeon: Grace Isaac, MD;  Location: [facility];  Service: Thoracic;  Laterality: N/A;   VIDEO BRONCHOSCOPY WITH ENDOBRONCHIAL ULTRASOUND N/A 01/27/2019   Procedure: VIDEO BRONCHOSCOPY WITH ENDOBRONCHIAL ULTRASOUND;  Surgeon: Grace Isaac, MD;  Location: [facility];  Service: Thoracic;  Laterality: N/A;    REVIEW OF SYSTEMS:  Constitutional: negative Eyes: negative Ears, nose, mouth, throat, and face: negative Respiratory: negative Cardiovascular: negative Gastrointestinal: negative Genitourinary:negative Integument/breast: negative Hematologic/lymphatic: negative Musculoskeletal:negative Neurological: negative Behavioral/Psych: negative Endocrine: negative Allergic/Immunologic: negative   PHYSICAL EXAMINATION: General appearance: alert, cooperative and no distress Head: Normocephalic, without obvious abnormality, atraumatic Neck: no adenopathy, no JVD, supple, symmetrical, trachea midline and thyroid not enlarged, symmetric, no tenderness/mass/nodules Lymph nodes: Cervical, supraclavicular, and axillary nodes normal. Resp: clear to auscultation bilaterally Back: symmetric, no curvature. ROM normal. No CVA tenderness. Cardio: regular rate and rhythm, S1, S2 normal, no murmur, click, rub or gallop GI: soft, non-tender; bowel sounds normal; no masses,  no organomegaly Extremities: extremities normal, atraumatic, no cyanosis or edema Neurologic: Alert  and oriented X 3, normal strength and tone. Normal symmetric reflexes. Normal coordination and gait  ECOG PERFORMANCE STATUS: 1 - Symptomatic but completely ambulatory  Blood pressure (!) 142/72, pulse 67, temperature 99.2 F (37.3 C), temperature source Temporal, resp. rate 18, height 5\' 9"  (1.753 m), weight 178 lb 8 oz (81 kg), SpO2 98 %.  LABORATORY DATA: Lab Results  Component Value Date   WBC 7.9 12/05/2019   HGB 11.3 (L) 12/05/2019   HCT 34.3 (L) 12/05/2019   MCV 93.2 12/05/2019   PLT 196 12/05/2019      Chemistry      Component Value Date/Time   NA 139 12/05/2019 1126   K 3.7 12/05/2019 1126   CL 95 (L) 12/05/2019 1126   CO2 31 12/05/2019 1126   BUN 19 12/05/2019 1126   CREATININE 6.85 (HH) 12/05/2019 1126      Component Value Date/Time   CALCIUM 9.1 12/05/2019 1126   ALKPHOS 81 12/05/2019 1126   AST 17 12/05/2019 1126   ALT 9 12/05/2019 1126   BILITOT 0.5 12/05/2019 1126       RADIOGRAPHIC STUDIES: CT Chest Wo Contrast  Result Date: 12/05/2019 CLINICAL DATA:  Restaging lung cancer EXAM: CT  CHEST WITHOUT CONTRAST TECHNIQUE: Multidetector CT imaging of the chest was performed following the standard protocol without IV contrast. COMPARISON:  09/01/2019 FINDINGS: Cardiovascular: Coronary, aortic arch, and branch vessel atherosclerotic vascular disease. Aneurysm of the proximal aortic arch, 4.5 cm in diameter on image 54/2, previously the same by my measurements. Mild cardiomegaly. Descending thoracic aortic aneurysm 4.3 cm in diameter on image 132/2, stable by my measurements. Mediastinum/Nodes: Right upper paratracheal node 0.8 cm in short axis on image 27/2, stable. Obscuration of the left hilum due to surrounding opacities. Lungs/Pleura: Emphysema. Biapical pleuroparenchymal scarring. New bandlike densities with some nodularity the right lung apex including 0.6 by 0.5 cm nodular component on image 3/7. Pleural-based nodule posteriorly in the right upper lobe, 0.7 by 0.6  cm on image 40/7, previously 0.6 by 0.5 cm. Peripheral ground-glass density nodule in the right upper lobe on image 84/7, 1.2 by 1.1 cm, stable. Mild nodularity along the minor fissure, similar to prior. A new solid-appearing nodule in the right middle lobe measures 0.7 by 0.5 cm on image 122/7. A new nodule in the right lower lobe measures 1.0 by 0.7 cm on image 120/7. Similar left perihilar consolidation with an anterior-posterior orientation suspicious for radiation port. No significant change in contour. Mildly worsened atelectasis along the left hemidiaphragm. New bandlike subpleural nodularity in the left lower lobe measuring 1.7 by 0.6 cm on image 86/7. Trace right and small to moderate left pleural effusions with passive atelectasis. Upper Abdomen: Upper abdominal ventral hernias noted, one containing adipose tissue and another containing adipose tissue and part of the transverse colon. Abdominal aortic atherosclerosis. Musculoskeletal: Thoracic spondylosis. IMPRESSION: 1. There are several new pulmonary nodules in the right lower lobe lower lobe and right middle lobe, which may represent early metastatic lesions or inflammatory nodules. There is also a somewhat sub solid elongated subpleural nodule in the left lower lobe which is new and merit surveillance. 2. Stable sub solid nodule in the right upper lobe, 1.2 cm in long axis. Postinflammatory versus low-grade adenocarcinoma. 3. Stable left perihilar consolidation with an anterior-posterior orientation, suspicious for radiation port. 4. Trace right and small to moderate left pleural effusions with passive atelectasis. 5. Other imaging findings of potential clinical significance: Coronary, aortic arch, and branch vessel atherosclerotic vascular disease. Mild cardiomegaly. Stable thoracic aortic aneurysms. Upper abdominal ventral hernias containing adipose tissue and part of the transverse colon. Aortic Atherosclerosis (ICD10-I70.0) and Emphysema  (ICD10-J43.9). Electronically Signed   By: Van Clines M.D.   On: 12/05/2019 13:39    ASSESSMENT AND PLAN: This is a very pleasant 74 years old white male with limited stage small cell lung cancer and currently undergoing systemic chemotherapy with carboplatin and etoposide status post 4 cycles. The patient tolerated his treatment well except for the pancytopenia that was complicated by his end-stage renal disease. The patient is currently on observation and he is feeling fine today with no concerning complaints. He had repeat CT scan of the chest performed recently.  I personally and independently reviewed the scan images and discussed the results with the patient and his wife. His scan showed no concerning findings for disease progression but there was several new pulmonary nodules in the right lower lobe and right middle lobe suspicious for inflammatory process versus early metastatic disease. I recommended for the patient to continue on observation but we will have repeat CT scan of the chest in around 6 weeks for further evaluation of these nodules. For hypertension, I strongly advised the patient to take his  blood pressure medication as prescribed and to monitor it closely at home. For the end-stage renal disease, the patient will continue his hemodialysis in [place] as planned on Tuesday, Thursday and Saturdays. He was advised to call immediately if he has any other concerning symptoms in the interval. The patient voices understanding of current disease status and treatment options and is in agreement with the current care plan.  All questions were answered. The patient knows to call the clinic with any problems, questions or concerns. We can certainly see the patient much sooner if necessary.  Disclaimer: This note was dictated with voice recognition software. Similar sounding words can inadvertently be transcribed and may not be corrected upon review.

## 2019-12-09 DIAGNOSIS — Z23 Encounter for immunization: Secondary | ICD-10-CM | POA: Diagnosis not present

## 2019-12-09 DIAGNOSIS — D631 Anemia in chronic kidney disease: Secondary | ICD-10-CM | POA: Diagnosis not present

## 2019-12-09 DIAGNOSIS — D509 Iron deficiency anemia, unspecified: Secondary | ICD-10-CM | POA: Diagnosis not present

## 2019-12-09 DIAGNOSIS — Z992 Dependence on renal dialysis: Secondary | ICD-10-CM | POA: Diagnosis not present

## 2019-12-09 DIAGNOSIS — E876 Hypokalemia: Secondary | ICD-10-CM | POA: Diagnosis not present

## 2019-12-09 DIAGNOSIS — N186 End stage renal disease: Secondary | ICD-10-CM | POA: Diagnosis not present

## 2019-12-09 DIAGNOSIS — E039 Hypothyroidism, unspecified: Secondary | ICD-10-CM | POA: Diagnosis not present

## 2019-12-09 DIAGNOSIS — D689 Coagulation defect, unspecified: Secondary | ICD-10-CM | POA: Diagnosis not present

## 2019-12-09 DIAGNOSIS — N2581 Secondary hyperparathyroidism of renal origin: Secondary | ICD-10-CM | POA: Diagnosis not present

## 2019-12-10 ENCOUNTER — Telehealth: Payer: Self-pay | Admitting: Internal Medicine

## 2019-12-10 NOTE — Telephone Encounter (Signed)
Scheduled per los. Called and left msg. mailed printout  

## 2019-12-11 DIAGNOSIS — N2581 Secondary hyperparathyroidism of renal origin: Secondary | ICD-10-CM | POA: Diagnosis not present

## 2019-12-11 DIAGNOSIS — E876 Hypokalemia: Secondary | ICD-10-CM | POA: Diagnosis not present

## 2019-12-11 DIAGNOSIS — D509 Iron deficiency anemia, unspecified: Secondary | ICD-10-CM | POA: Diagnosis not present

## 2019-12-11 DIAGNOSIS — Z23 Encounter for immunization: Secondary | ICD-10-CM | POA: Diagnosis not present

## 2019-12-11 DIAGNOSIS — D689 Coagulation defect, unspecified: Secondary | ICD-10-CM | POA: Diagnosis not present

## 2019-12-11 DIAGNOSIS — D631 Anemia in chronic kidney disease: Secondary | ICD-10-CM | POA: Diagnosis not present

## 2019-12-11 DIAGNOSIS — N186 End stage renal disease: Secondary | ICD-10-CM | POA: Diagnosis not present

## 2019-12-11 DIAGNOSIS — Z992 Dependence on renal dialysis: Secondary | ICD-10-CM | POA: Diagnosis not present

## 2019-12-11 DIAGNOSIS — E039 Hypothyroidism, unspecified: Secondary | ICD-10-CM | POA: Diagnosis not present

## 2019-12-13 DIAGNOSIS — Z23 Encounter for immunization: Secondary | ICD-10-CM | POA: Diagnosis not present

## 2019-12-13 DIAGNOSIS — E039 Hypothyroidism, unspecified: Secondary | ICD-10-CM | POA: Diagnosis not present

## 2019-12-13 DIAGNOSIS — D689 Coagulation defect, unspecified: Secondary | ICD-10-CM | POA: Diagnosis not present

## 2019-12-13 DIAGNOSIS — D631 Anemia in chronic kidney disease: Secondary | ICD-10-CM | POA: Diagnosis not present

## 2019-12-13 DIAGNOSIS — N186 End stage renal disease: Secondary | ICD-10-CM | POA: Diagnosis not present

## 2019-12-13 DIAGNOSIS — D509 Iron deficiency anemia, unspecified: Secondary | ICD-10-CM | POA: Diagnosis not present

## 2019-12-13 DIAGNOSIS — E876 Hypokalemia: Secondary | ICD-10-CM | POA: Diagnosis not present

## 2019-12-13 DIAGNOSIS — N2581 Secondary hyperparathyroidism of renal origin: Secondary | ICD-10-CM | POA: Diagnosis not present

## 2019-12-13 DIAGNOSIS — Z992 Dependence on renal dialysis: Secondary | ICD-10-CM | POA: Diagnosis not present

## 2019-12-14 DIAGNOSIS — Z992 Dependence on renal dialysis: Secondary | ICD-10-CM | POA: Diagnosis not present

## 2019-12-14 DIAGNOSIS — I129 Hypertensive chronic kidney disease with stage 1 through stage 4 chronic kidney disease, or unspecified chronic kidney disease: Secondary | ICD-10-CM | POA: Diagnosis not present

## 2019-12-14 DIAGNOSIS — N189 Chronic kidney disease, unspecified: Secondary | ICD-10-CM | POA: Diagnosis not present

## 2019-12-16 DIAGNOSIS — N2581 Secondary hyperparathyroidism of renal origin: Secondary | ICD-10-CM | POA: Diagnosis not present

## 2019-12-16 DIAGNOSIS — D689 Coagulation defect, unspecified: Secondary | ICD-10-CM | POA: Diagnosis not present

## 2019-12-16 DIAGNOSIS — D631 Anemia in chronic kidney disease: Secondary | ICD-10-CM | POA: Diagnosis not present

## 2019-12-16 DIAGNOSIS — Z992 Dependence on renal dialysis: Secondary | ICD-10-CM | POA: Diagnosis not present

## 2019-12-16 DIAGNOSIS — N186 End stage renal disease: Secondary | ICD-10-CM | POA: Diagnosis not present

## 2019-12-16 DIAGNOSIS — D509 Iron deficiency anemia, unspecified: Secondary | ICD-10-CM | POA: Diagnosis not present

## 2019-12-18 DIAGNOSIS — N2581 Secondary hyperparathyroidism of renal origin: Secondary | ICD-10-CM | POA: Diagnosis not present

## 2019-12-18 DIAGNOSIS — N186 End stage renal disease: Secondary | ICD-10-CM | POA: Diagnosis not present

## 2019-12-18 DIAGNOSIS — Z992 Dependence on renal dialysis: Secondary | ICD-10-CM | POA: Diagnosis not present

## 2019-12-18 DIAGNOSIS — D689 Coagulation defect, unspecified: Secondary | ICD-10-CM | POA: Diagnosis not present

## 2019-12-18 DIAGNOSIS — D509 Iron deficiency anemia, unspecified: Secondary | ICD-10-CM | POA: Diagnosis not present

## 2019-12-18 DIAGNOSIS — D631 Anemia in chronic kidney disease: Secondary | ICD-10-CM | POA: Diagnosis not present

## 2019-12-19 DIAGNOSIS — K432 Incisional hernia without obstruction or gangrene: Secondary | ICD-10-CM | POA: Diagnosis not present

## 2019-12-20 DIAGNOSIS — D509 Iron deficiency anemia, unspecified: Secondary | ICD-10-CM | POA: Diagnosis not present

## 2019-12-20 DIAGNOSIS — Z992 Dependence on renal dialysis: Secondary | ICD-10-CM | POA: Diagnosis not present

## 2019-12-20 DIAGNOSIS — D689 Coagulation defect, unspecified: Secondary | ICD-10-CM | POA: Diagnosis not present

## 2019-12-20 DIAGNOSIS — D631 Anemia in chronic kidney disease: Secondary | ICD-10-CM | POA: Diagnosis not present

## 2019-12-20 DIAGNOSIS — N2581 Secondary hyperparathyroidism of renal origin: Secondary | ICD-10-CM | POA: Diagnosis not present

## 2019-12-20 DIAGNOSIS — N186 End stage renal disease: Secondary | ICD-10-CM | POA: Diagnosis not present

## 2019-12-23 DIAGNOSIS — N186 End stage renal disease: Secondary | ICD-10-CM | POA: Diagnosis not present

## 2019-12-23 DIAGNOSIS — Z992 Dependence on renal dialysis: Secondary | ICD-10-CM | POA: Diagnosis not present

## 2019-12-23 DIAGNOSIS — D509 Iron deficiency anemia, unspecified: Secondary | ICD-10-CM | POA: Diagnosis not present

## 2019-12-23 DIAGNOSIS — D631 Anemia in chronic kidney disease: Secondary | ICD-10-CM | POA: Diagnosis not present

## 2019-12-23 DIAGNOSIS — N2581 Secondary hyperparathyroidism of renal origin: Secondary | ICD-10-CM | POA: Diagnosis not present

## 2019-12-23 DIAGNOSIS — D689 Coagulation defect, unspecified: Secondary | ICD-10-CM | POA: Diagnosis not present

## 2019-12-25 DIAGNOSIS — D509 Iron deficiency anemia, unspecified: Secondary | ICD-10-CM | POA: Diagnosis not present

## 2019-12-25 DIAGNOSIS — Z992 Dependence on renal dialysis: Secondary | ICD-10-CM | POA: Diagnosis not present

## 2019-12-25 DIAGNOSIS — N2581 Secondary hyperparathyroidism of renal origin: Secondary | ICD-10-CM | POA: Diagnosis not present

## 2019-12-25 DIAGNOSIS — D689 Coagulation defect, unspecified: Secondary | ICD-10-CM | POA: Diagnosis not present

## 2019-12-25 DIAGNOSIS — N186 End stage renal disease: Secondary | ICD-10-CM | POA: Diagnosis not present

## 2019-12-25 DIAGNOSIS — D631 Anemia in chronic kidney disease: Secondary | ICD-10-CM | POA: Diagnosis not present

## 2019-12-27 DIAGNOSIS — D631 Anemia in chronic kidney disease: Secondary | ICD-10-CM | POA: Diagnosis not present

## 2019-12-27 DIAGNOSIS — N186 End stage renal disease: Secondary | ICD-10-CM | POA: Diagnosis not present

## 2019-12-27 DIAGNOSIS — D689 Coagulation defect, unspecified: Secondary | ICD-10-CM | POA: Diagnosis not present

## 2019-12-27 DIAGNOSIS — N2581 Secondary hyperparathyroidism of renal origin: Secondary | ICD-10-CM | POA: Diagnosis not present

## 2019-12-27 DIAGNOSIS — D509 Iron deficiency anemia, unspecified: Secondary | ICD-10-CM | POA: Diagnosis not present

## 2019-12-27 DIAGNOSIS — Z992 Dependence on renal dialysis: Secondary | ICD-10-CM | POA: Diagnosis not present

## 2019-12-30 DIAGNOSIS — D689 Coagulation defect, unspecified: Secondary | ICD-10-CM | POA: Diagnosis not present

## 2019-12-30 DIAGNOSIS — N2581 Secondary hyperparathyroidism of renal origin: Secondary | ICD-10-CM | POA: Diagnosis not present

## 2019-12-30 DIAGNOSIS — D631 Anemia in chronic kidney disease: Secondary | ICD-10-CM | POA: Diagnosis not present

## 2019-12-30 DIAGNOSIS — D509 Iron deficiency anemia, unspecified: Secondary | ICD-10-CM | POA: Diagnosis not present

## 2019-12-30 DIAGNOSIS — Z992 Dependence on renal dialysis: Secondary | ICD-10-CM | POA: Diagnosis not present

## 2019-12-30 DIAGNOSIS — N186 End stage renal disease: Secondary | ICD-10-CM | POA: Diagnosis not present

## 2019-12-31 DIAGNOSIS — G2581 Restless legs syndrome: Secondary | ICD-10-CM | POA: Diagnosis not present

## 2019-12-31 DIAGNOSIS — I10 Essential (primary) hypertension: Secondary | ICD-10-CM | POA: Diagnosis not present

## 2019-12-31 DIAGNOSIS — N186 End stage renal disease: Secondary | ICD-10-CM | POA: Diagnosis not present

## 2020-01-01 DIAGNOSIS — N186 End stage renal disease: Secondary | ICD-10-CM | POA: Diagnosis not present

## 2020-01-01 DIAGNOSIS — D631 Anemia in chronic kidney disease: Secondary | ICD-10-CM | POA: Diagnosis not present

## 2020-01-01 DIAGNOSIS — Z992 Dependence on renal dialysis: Secondary | ICD-10-CM | POA: Diagnosis not present

## 2020-01-01 DIAGNOSIS — D689 Coagulation defect, unspecified: Secondary | ICD-10-CM | POA: Diagnosis not present

## 2020-01-01 DIAGNOSIS — N2581 Secondary hyperparathyroidism of renal origin: Secondary | ICD-10-CM | POA: Diagnosis not present

## 2020-01-01 DIAGNOSIS — D509 Iron deficiency anemia, unspecified: Secondary | ICD-10-CM | POA: Diagnosis not present

## 2020-01-03 DIAGNOSIS — N186 End stage renal disease: Secondary | ICD-10-CM | POA: Diagnosis not present

## 2020-01-03 DIAGNOSIS — N2581 Secondary hyperparathyroidism of renal origin: Secondary | ICD-10-CM | POA: Diagnosis not present

## 2020-01-03 DIAGNOSIS — D689 Coagulation defect, unspecified: Secondary | ICD-10-CM | POA: Diagnosis not present

## 2020-01-03 DIAGNOSIS — D509 Iron deficiency anemia, unspecified: Secondary | ICD-10-CM | POA: Diagnosis not present

## 2020-01-03 DIAGNOSIS — D631 Anemia in chronic kidney disease: Secondary | ICD-10-CM | POA: Diagnosis not present

## 2020-01-03 DIAGNOSIS — Z992 Dependence on renal dialysis: Secondary | ICD-10-CM | POA: Diagnosis not present

## 2020-01-06 DIAGNOSIS — D509 Iron deficiency anemia, unspecified: Secondary | ICD-10-CM | POA: Diagnosis not present

## 2020-01-06 DIAGNOSIS — Z992 Dependence on renal dialysis: Secondary | ICD-10-CM | POA: Diagnosis not present

## 2020-01-06 DIAGNOSIS — N186 End stage renal disease: Secondary | ICD-10-CM | POA: Diagnosis not present

## 2020-01-06 DIAGNOSIS — D631 Anemia in chronic kidney disease: Secondary | ICD-10-CM | POA: Diagnosis not present

## 2020-01-06 DIAGNOSIS — N2581 Secondary hyperparathyroidism of renal origin: Secondary | ICD-10-CM | POA: Diagnosis not present

## 2020-01-06 DIAGNOSIS — D689 Coagulation defect, unspecified: Secondary | ICD-10-CM | POA: Diagnosis not present

## 2020-01-08 DIAGNOSIS — D509 Iron deficiency anemia, unspecified: Secondary | ICD-10-CM | POA: Diagnosis not present

## 2020-01-08 DIAGNOSIS — N186 End stage renal disease: Secondary | ICD-10-CM | POA: Diagnosis not present

## 2020-01-08 DIAGNOSIS — N2581 Secondary hyperparathyroidism of renal origin: Secondary | ICD-10-CM | POA: Diagnosis not present

## 2020-01-08 DIAGNOSIS — Z992 Dependence on renal dialysis: Secondary | ICD-10-CM | POA: Diagnosis not present

## 2020-01-08 DIAGNOSIS — D631 Anemia in chronic kidney disease: Secondary | ICD-10-CM | POA: Diagnosis not present

## 2020-01-08 DIAGNOSIS — D689 Coagulation defect, unspecified: Secondary | ICD-10-CM | POA: Diagnosis not present

## 2020-01-10 DIAGNOSIS — Z992 Dependence on renal dialysis: Secondary | ICD-10-CM | POA: Diagnosis not present

## 2020-01-10 DIAGNOSIS — D509 Iron deficiency anemia, unspecified: Secondary | ICD-10-CM | POA: Diagnosis not present

## 2020-01-10 DIAGNOSIS — N2581 Secondary hyperparathyroidism of renal origin: Secondary | ICD-10-CM | POA: Diagnosis not present

## 2020-01-10 DIAGNOSIS — D631 Anemia in chronic kidney disease: Secondary | ICD-10-CM | POA: Diagnosis not present

## 2020-01-10 DIAGNOSIS — D689 Coagulation defect, unspecified: Secondary | ICD-10-CM | POA: Diagnosis not present

## 2020-01-10 DIAGNOSIS — N186 End stage renal disease: Secondary | ICD-10-CM | POA: Diagnosis not present

## 2020-01-11 DIAGNOSIS — I129 Hypertensive chronic kidney disease with stage 1 through stage 4 chronic kidney disease, or unspecified chronic kidney disease: Secondary | ICD-10-CM | POA: Diagnosis not present

## 2020-01-11 DIAGNOSIS — N189 Chronic kidney disease, unspecified: Secondary | ICD-10-CM | POA: Diagnosis not present

## 2020-01-11 DIAGNOSIS — Z992 Dependence on renal dialysis: Secondary | ICD-10-CM | POA: Diagnosis not present

## 2020-01-13 DIAGNOSIS — N186 End stage renal disease: Secondary | ICD-10-CM | POA: Diagnosis not present

## 2020-01-13 DIAGNOSIS — D631 Anemia in chronic kidney disease: Secondary | ICD-10-CM | POA: Diagnosis not present

## 2020-01-13 DIAGNOSIS — Z992 Dependence on renal dialysis: Secondary | ICD-10-CM | POA: Diagnosis not present

## 2020-01-13 DIAGNOSIS — N2581 Secondary hyperparathyroidism of renal origin: Secondary | ICD-10-CM | POA: Diagnosis not present

## 2020-01-13 DIAGNOSIS — D509 Iron deficiency anemia, unspecified: Secondary | ICD-10-CM | POA: Diagnosis not present

## 2020-01-13 DIAGNOSIS — D689 Coagulation defect, unspecified: Secondary | ICD-10-CM | POA: Diagnosis not present

## 2020-01-15 DIAGNOSIS — D631 Anemia in chronic kidney disease: Secondary | ICD-10-CM | POA: Diagnosis not present

## 2020-01-15 DIAGNOSIS — N186 End stage renal disease: Secondary | ICD-10-CM | POA: Diagnosis not present

## 2020-01-15 DIAGNOSIS — D509 Iron deficiency anemia, unspecified: Secondary | ICD-10-CM | POA: Diagnosis not present

## 2020-01-15 DIAGNOSIS — N2581 Secondary hyperparathyroidism of renal origin: Secondary | ICD-10-CM | POA: Diagnosis not present

## 2020-01-15 DIAGNOSIS — D689 Coagulation defect, unspecified: Secondary | ICD-10-CM | POA: Diagnosis not present

## 2020-01-15 DIAGNOSIS — Z992 Dependence on renal dialysis: Secondary | ICD-10-CM | POA: Diagnosis not present

## 2020-01-16 ENCOUNTER — Encounter (HOSPITAL_COMMUNITY): Payer: Self-pay

## 2020-01-16 ENCOUNTER — Telehealth: Payer: Self-pay | Admitting: Medical Oncology

## 2020-01-16 ENCOUNTER — Other Ambulatory Visit: Payer: Self-pay

## 2020-01-16 ENCOUNTER — Inpatient Hospital Stay: Payer: Medicare Other | Attending: Internal Medicine

## 2020-01-16 ENCOUNTER — Ambulatory Visit (HOSPITAL_COMMUNITY)
Admission: RE | Admit: 2020-01-16 | Discharge: 2020-01-16 | Disposition: A | Discharge status: Home / Self Care | Payer: Medicare Other | Source: Ambulatory Visit | Attending: Internal Medicine | Admitting: Internal Medicine

## 2020-01-16 DIAGNOSIS — Z880 Allergy status to penicillin: Secondary | ICD-10-CM | POA: Diagnosis not present

## 2020-01-16 DIAGNOSIS — I1311 Hypertensive heart and chronic kidney disease without heart failure, with stage 5 chronic kidney disease, or end stage renal disease: Secondary | ICD-10-CM | POA: Insufficient documentation

## 2020-01-16 DIAGNOSIS — Z9221 Personal history of antineoplastic chemotherapy: Secondary | ICD-10-CM | POA: Diagnosis not present

## 2020-01-16 DIAGNOSIS — Z923 Personal history of irradiation: Secondary | ICD-10-CM | POA: Insufficient documentation

## 2020-01-16 DIAGNOSIS — E039 Hypothyroidism, unspecified: Secondary | ICD-10-CM | POA: Insufficient documentation

## 2020-01-16 DIAGNOSIS — Z992 Dependence on renal dialysis: Secondary | ICD-10-CM | POA: Insufficient documentation

## 2020-01-16 DIAGNOSIS — C3490 Malignant neoplasm of unspecified part of unspecified bronchus or lung: Secondary | ICD-10-CM | POA: Insufficient documentation

## 2020-01-16 DIAGNOSIS — K219 Gastro-esophageal reflux disease without esophagitis: Secondary | ICD-10-CM | POA: Diagnosis not present

## 2020-01-16 DIAGNOSIS — D61818 Other pancytopenia: Secondary | ICD-10-CM | POA: Insufficient documentation

## 2020-01-16 DIAGNOSIS — R590 Localized enlarged lymph nodes: Secondary | ICD-10-CM | POA: Insufficient documentation

## 2020-01-16 DIAGNOSIS — I2510 Atherosclerotic heart disease of native coronary artery without angina pectoris: Secondary | ICD-10-CM | POA: Insufficient documentation

## 2020-01-16 DIAGNOSIS — N186 End stage renal disease: Secondary | ICD-10-CM | POA: Insufficient documentation

## 2020-01-16 DIAGNOSIS — J90 Pleural effusion, not elsewhere classified: Secondary | ICD-10-CM | POA: Diagnosis not present

## 2020-01-16 DIAGNOSIS — C3411 Malignant neoplasm of upper lobe, right bronchus or lung: Secondary | ICD-10-CM | POA: Insufficient documentation

## 2020-01-16 DIAGNOSIS — K439 Ventral hernia without obstruction or gangrene: Secondary | ICD-10-CM | POA: Diagnosis not present

## 2020-01-16 DIAGNOSIS — I712 Thoracic aortic aneurysm, without rupture: Secondary | ICD-10-CM | POA: Diagnosis not present

## 2020-01-16 DIAGNOSIS — R0609 Other forms of dyspnea: Secondary | ICD-10-CM | POA: Insufficient documentation

## 2020-01-16 DIAGNOSIS — I252 Old myocardial infarction: Secondary | ICD-10-CM | POA: Diagnosis not present

## 2020-01-16 DIAGNOSIS — Z79899 Other long term (current) drug therapy: Secondary | ICD-10-CM | POA: Insufficient documentation

## 2020-01-16 LAB — CBC WITH DIFFERENTIAL (CANCER CENTER ONLY)
Abs Immature Granulocytes: 0.02 10*3/uL (ref 0.00–0.07)
Basophils Absolute: 0.1 10*3/uL (ref 0.0–0.1)
Basophils Relative: 1 %
Eosinophils Absolute: 0.2 10*3/uL (ref 0.0–0.5)
Eosinophils Relative: 3 %
HCT: 33.2 % — ABNORMAL LOW (ref 39.0–52.0)
Hemoglobin: 11.1 g/dL — ABNORMAL LOW (ref 13.0–17.0)
Immature Granulocytes: 0 %
Lymphocytes Relative: 30 %
Lymphs Abs: 2.1 10*3/uL (ref 0.7–4.0)
MCH: 32.2 pg (ref 26.0–34.0)
MCHC: 33.4 g/dL (ref 30.0–36.0)
MCV: 96.2 fL (ref 80.0–100.0)
Monocytes Absolute: 1 10*3/uL (ref 0.1–1.0)
Monocytes Relative: 15 %
Neutro Abs: 3.5 10*3/uL (ref 1.7–7.7)
Neutrophils Relative %: 51 %
Platelet Count: 151 10*3/uL (ref 150–400)
RBC: 3.45 MIL/uL — ABNORMAL LOW (ref 4.22–5.81)
RDW: 16.7 % — ABNORMAL HIGH (ref 11.5–15.5)
WBC Count: 6.9 10*3/uL (ref 4.0–10.5)
nRBC: 0 % (ref 0.0–0.2)

## 2020-01-16 LAB — CMP (CANCER CENTER ONLY)
ALT: 9 U/L (ref 0–44)
AST: 19 U/L (ref 15–41)
Albumin: 3.3 g/dL — ABNORMAL LOW (ref 3.5–5.0)
Alkaline Phosphatase: 80 U/L (ref 38–126)
Anion gap: 10 (ref 5–15)
BUN: 18 mg/dL (ref 8–23)
CO2: 33 mmol/L — ABNORMAL HIGH (ref 22–32)
Calcium: 9 mg/dL (ref 8.9–10.3)
Chloride: 94 mmol/L — ABNORMAL LOW (ref 98–111)
Creatinine: 6.69 mg/dL (ref 0.61–1.24)
GFR, Est AFR Am: 9 mL/min — ABNORMAL LOW (ref >60)
GFR, Estimated: 7 mL/min — ABNORMAL LOW (ref >60)
Glucose, Bld: 96 mg/dL (ref 70–99)
Potassium: 3.6 mmol/L (ref 3.5–5.1)
Sodium: 137 mmol/L (ref 135–145)
Total Bilirubin: 0.5 mg/dL (ref 0.3–1.2)
Total Protein: 7.3 g/dL (ref 6.5–8.1)

## 2020-01-16 NOTE — Telephone Encounter (Signed)
CRITICAL VALUE STICKER  CRITICAL VALUE: creatinine 6.69  RECEIVER (on-site recipient of call):Kainen Struckman  DATE & TIME NOTIFIED: 1222 01/16/2020  MESSENGER (representative from lab): Verdis Frederickson MD NOTIFIED: Rubin Payor Heilingoetter  TIME OF NOTIFICATION:1226  RESPONSE: Pt on dialysis and this is why his creatinine is elevated.   Faxed lab to [facility].

## 2020-01-17 DIAGNOSIS — D689 Coagulation defect, unspecified: Secondary | ICD-10-CM | POA: Diagnosis not present

## 2020-01-17 DIAGNOSIS — Z992 Dependence on renal dialysis: Secondary | ICD-10-CM | POA: Diagnosis not present

## 2020-01-17 DIAGNOSIS — D631 Anemia in chronic kidney disease: Secondary | ICD-10-CM | POA: Diagnosis not present

## 2020-01-17 DIAGNOSIS — D509 Iron deficiency anemia, unspecified: Secondary | ICD-10-CM | POA: Diagnosis not present

## 2020-01-17 DIAGNOSIS — N186 End stage renal disease: Secondary | ICD-10-CM | POA: Diagnosis not present

## 2020-01-17 DIAGNOSIS — N2581 Secondary hyperparathyroidism of renal origin: Secondary | ICD-10-CM | POA: Diagnosis not present

## 2020-01-19 ENCOUNTER — Inpatient Hospital Stay (HOSPITAL_BASED_OUTPATIENT_CLINIC_OR_DEPARTMENT_OTHER): Payer: Medicare Other | Admitting: Internal Medicine

## 2020-01-19 ENCOUNTER — Encounter: Payer: Self-pay | Admitting: Internal Medicine

## 2020-01-19 ENCOUNTER — Other Ambulatory Visit: Payer: Self-pay

## 2020-01-19 VITALS — BP 150/73 | HR 81 | Temp 98.9°F | Resp 18 | Ht 69.0 in | Wt 178.6 lb

## 2020-01-19 DIAGNOSIS — Z992 Dependence on renal dialysis: Secondary | ICD-10-CM | POA: Diagnosis not present

## 2020-01-19 DIAGNOSIS — I252 Old myocardial infarction: Secondary | ICD-10-CM | POA: Diagnosis not present

## 2020-01-19 DIAGNOSIS — Z79899 Other long term (current) drug therapy: Secondary | ICD-10-CM | POA: Diagnosis not present

## 2020-01-19 DIAGNOSIS — K439 Ventral hernia without obstruction or gangrene: Secondary | ICD-10-CM | POA: Diagnosis not present

## 2020-01-19 DIAGNOSIS — I10 Essential (primary) hypertension: Secondary | ICD-10-CM

## 2020-01-19 DIAGNOSIS — I712 Thoracic aortic aneurysm, without rupture: Secondary | ICD-10-CM | POA: Diagnosis not present

## 2020-01-19 DIAGNOSIS — Z923 Personal history of irradiation: Secondary | ICD-10-CM | POA: Diagnosis not present

## 2020-01-19 DIAGNOSIS — C3490 Malignant neoplasm of unspecified part of unspecified bronchus or lung: Secondary | ICD-10-CM

## 2020-01-19 DIAGNOSIS — D61818 Other pancytopenia: Secondary | ICD-10-CM | POA: Diagnosis not present

## 2020-01-19 DIAGNOSIS — C3411 Malignant neoplasm of upper lobe, right bronchus or lung: Secondary | ICD-10-CM

## 2020-01-19 DIAGNOSIS — I1311 Hypertensive heart and chronic kidney disease without heart failure, with stage 5 chronic kidney disease, or end stage renal disease: Secondary | ICD-10-CM | POA: Diagnosis not present

## 2020-01-19 DIAGNOSIS — Z9221 Personal history of antineoplastic chemotherapy: Secondary | ICD-10-CM | POA: Diagnosis not present

## 2020-01-19 DIAGNOSIS — N186 End stage renal disease: Secondary | ICD-10-CM | POA: Diagnosis not present

## 2020-01-19 DIAGNOSIS — I2510 Atherosclerotic heart disease of native coronary artery without angina pectoris: Secondary | ICD-10-CM | POA: Diagnosis not present

## 2020-01-19 DIAGNOSIS — K219 Gastro-esophageal reflux disease without esophagitis: Secondary | ICD-10-CM | POA: Diagnosis not present

## 2020-01-19 DIAGNOSIS — E039 Hypothyroidism, unspecified: Secondary | ICD-10-CM | POA: Diagnosis not present

## 2020-01-19 DIAGNOSIS — Z880 Allergy status to penicillin: Secondary | ICD-10-CM | POA: Diagnosis not present

## 2020-01-19 DIAGNOSIS — R590 Localized enlarged lymph nodes: Secondary | ICD-10-CM | POA: Diagnosis not present

## 2020-01-19 DIAGNOSIS — R0609 Other forms of dyspnea: Secondary | ICD-10-CM | POA: Diagnosis not present

## 2020-01-19 DIAGNOSIS — J90 Pleural effusion, not elsewhere classified: Secondary | ICD-10-CM | POA: Diagnosis not present

## 2020-01-19 NOTE — Progress Notes (Signed)
[place] Telephone:([phone]   Fax:(336) [phone]  OFFICE PROGRESS NOTE  Jeremy Noble, MD [address] [place] [address]  DIAGNOSIS: Limited stage (T2b, N1, M0) small cell lung cancer presented with right upper lobe with invasion of superior segment of right lower lobe as well as right hilar lymphadenopathy diagnosed in March 2020.  PRIOR THERAPY:  Systemic chemotherapy with carboplatin for AUC of 5 on day 1 and etoposide 100 mg/M2 on days 1, 2 and 3 every 3 weeks.  Status post 4 cycles.  CURRENT THERAPY: Observation.   INTERVAL HISTORY: Jeremy Johnson 74 y.o. male returns to the clinic today for follow-up visit.  His wife was available by phone during the visit.  The patient denied having any current chest pain, shortness of breath, cough or hemoptysis.  He denied having any fever or chills.  He has no nausea, vomiting, diarrhea or constipation.  He denied having any headache or visual changes.  He had repeat CT scan of the chest performed recently and he is here for evaluation and discussion of his scan results.  MEDICAL HISTORY: Past Medical History:  Diagnosis Date  . Anemia   . Blood transfusion without reported diagnosis   . CAD (coronary artery disease)    STENT... MID CIRCUMFLEX...1997  . Chronic kidney disease    STAGE 3  . COPD (chronic obstructive pulmonary disease) ([facility])   . Degenerative joint disease (DJD) of lumbar spine   . GERD (gastroesophageal reflux disease)   . Gout   . Hyperlipidemia   . Hypertension   . Hypothyroidism   . Incisional hernia    abdomen  . Leukocytosis    CHRONIC MILD  . Myocardial infarction ([facility])    1997  . SCL CA dx'd 01/2019   Lung cancer    ALLERGIES:  is allergic to penicillins.  MEDICATIONS:  Current Outpatient Medications  Medication Sig Dispense Refill  . cloNIDine (CATAPRES) 0.1 MG tablet Take 0.1 mg by mouth 2 (two) times daily.    . pramipexole (MIRAPEX) 0.125 MG tablet Take  0.125 mg by mouth 3 (three) times daily.    [name] acetaminophen (TYLENOL) 325 MG tablet Take 2 tablets (650 mg total) by mouth every 6 (six) hours as needed for mild pain (or Fever >/= 101). 30 tablet 0  . allopurinol (ZYLOPRIM) 100 MG tablet Take 1 tablet (100 mg total) by mouth daily. 30 tablet 1  . amLODipine (NORVASC) 10 MG tablet Take by mouth.    . B Complex-C-Zn-Folic Acid (DIALYVITE [id number] 15) 0.8 MG TABS Take 1 tablet by mouth daily.    . Darbepoetin Alfa (ARANESP) 100 MCG/0.5ML SOSY injection Inject 0.5 mLs (100 mcg total) into the vein every Thursday with hemodialysis. 4.2 mL   . levocetirizine (XYZAL) 5 MG tablet Take 5 mg by mouth daily.    [name] levothyroxine (SYNTHROID, LEVOTHROID) 175 MCG tablet Take 200 mcg by mouth daily before breakfast.     . lidocaine-prilocaine (EMLA) cream     . Methoxy PEG-Epoetin Beta (MIRCERA IJ) Mircera    . metoprolol tartrate (LOPRESSOR) 50 MG tablet Take 25 mg by mouth 2 (two) times daily.     [name] omeprazole (PRILOSEC) 40 MG capsule     . polyethylene glycol (MIRALAX / GLYCOLAX) 17 g packet Take 17 g by mouth daily. 14 each 0  . simvastatin (ZOCOR) 20 MG tablet Take 20 mg by mouth at bedtime.      No current facility-administered medications  for this visit.    SURGICAL HISTORY:  Past Surgical History:  Procedure Laterality Date  . ABDOMINAL AORTIC ANEURYSM REPAIR  2006  . AV FISTULA PLACEMENT Left 04/25/2019   Procedure: ARTERIOVENOUS (AV) FISTULA CREATION LEFT ARM;  Surgeon: Angelia Mould, MD;  Location: [facility];  Service: Vascular;  Laterality: Left;  . AV FISTULA PLACEMENT Left 05/19/2019   Procedure: CONVERSION OF LEFT ARM ARTERIOVENOUS FISTULA TO GRAFT;  Surgeon: Angelia Mould, MD;  Location: [facility];  Service: Vascular;  Laterality: Left;  . BIOPSY  09/30/2018   Procedure: BIOPSY;  Surgeon: Danie Binder, MD;  [facility];  Service: Endoscopy;;  ascending colon  . COLONOSCOPY  2008  . COLONOSCOPY N/A 09/30/2018    Procedure: COLONOSCOPY;  Surgeon: Danie Binder, MD;  [facility];  Service: Endoscopy;  Laterality: N/A;  9:00  . CORONARY ANGIOPLASTY WITH STENT [name]  . IR FLUORO GUIDE CV LINE RIGHT  04/22/2019  . IR US GUIDE VASC ACCESS RIGHT  04/22/2019  . POLYPECTOMY  09/30/2018   Procedure: POLYPECTOMY;  Surgeon: Danie Binder, MD;  [facility];  Service: Endoscopy;;  colon  . VIDEO BRONCHOSCOPY WITH ENDOBRONCHIAL NAVIGATION N/A 01/27/2019   Procedure: VIDEO BRONCHOSCOPY WITH ENDOBRONCHIAL NAVIGATION;  Surgeon: Grace Isaac, MD;  Location: [facility];  Service: Thoracic;  Laterality: N/A;  . VIDEO BRONCHOSCOPY WITH ENDOBRONCHIAL ULTRASOUND N/A 01/27/2019   Procedure: VIDEO BRONCHOSCOPY WITH ENDOBRONCHIAL ULTRASOUND;  Surgeon: Grace Isaac, MD;  Location: [facility];  Service: Thoracic;  Laterality: N/A;    REVIEW OF SYSTEMS:  A comprehensive review of systems was negative except for: Respiratory: positive for dyspnea on exertion   PHYSICAL EXAMINATION: General appearance: alert, cooperative and no distress Head: Normocephalic, without obvious abnormality, atraumatic Neck: no adenopathy, no JVD, supple, symmetrical, trachea midline and thyroid not enlarged, symmetric, no tenderness/mass/nodules Lymph nodes: Cervical, supraclavicular, and axillary nodes normal. Resp: clear to auscultation bilaterally Back: symmetric, no curvature. ROM normal. No CVA tenderness. Cardio: regular rate and rhythm, S1, S2 normal, no murmur, click, rub or gallop GI: soft, non-tender; bowel sounds normal; no masses,  no organomegaly Extremities: extremities normal, atraumatic, no cyanosis or edema  ECOG PERFORMANCE STATUS: 1 - Symptomatic but completely ambulatory  Blood pressure (!) 150/73, pulse 81, temperature 98.9 F (37.2 C), temperature source Temporal, resp. rate 18, height 5\' 9"  (1.753 m), weight 178 lb 9.6 oz (81 kg), SpO2 96 %.  LABORATORY DATA: Lab Results    Component Value Date   WBC 6.9 01/16/2020   HGB 11.1 (L) 01/16/2020   HCT 33.2 (L) 01/16/2020   MCV 96.2 01/16/2020   PLT 151 01/16/2020      Chemistry      Component Value Date/Time   NA 137 01/16/2020 1131   K 3.6 01/16/2020 1131   CL 94 (L) 01/16/2020 1131   CO2 33 (H) 01/16/2020 1131   BUN 18 01/16/2020 1131   CREATININE 6.69 (HH) 01/16/2020 1131      Component Value Date/Time   CALCIUM 9.0 01/16/2020 1131   ALKPHOS 80 01/16/2020 1131   AST 19 01/16/2020 1131   ALT 9 01/16/2020 1131   BILITOT 0.5 01/16/2020 1131       RADIOGRAPHIC STUDIES: CT Chest Wo Contrast  Result Date: 01/16/2020 CLINICAL DATA:  Follow-up non-small cell lung carcinoma. Previous radiation therapy and chemotherapy. EXAM: CT CHEST WITHOUT CONTRAST TECHNIQUE: Multidetector CT imaging of the chest was performed following  the standard protocol without IV contrast. COMPARISON:  12/05/2019 FINDINGS: Cardiovascular: Stable thoracic aortic aneurysm which measures 4.5 cm in maximum diameter in the arch on image 55/2. Aortic and coronary artery atherosclerosis incidentally noted. Stable cardiomegaly, without evidence of pericardial effusion. Mediastinum/Nodes: No masses or pathologically enlarged lymph nodes identified on this unenhanced exam. Lungs/Pleura: Stable small left pleural effusion, and tiny right pleural effusion versus pleural thickening. Stable moderate centrilobular emphysema. Stable post radiation changes in left paramediastinal lung zone. Multiple sub-cm right lung nodules remain stable compared the prior study. Largest measures 9 mm in the posterior right lower lobe on image 122/5. Stable peripheral ground-glass nodule in the right upper lobe measuring 12 mm on image 84/5. A subpleural nodule in the left lower lobe measuring 15 x 6 mm on image 83/5 shows no significant change. No new or enlarging pulmonary nodules or masses identified. Upper Abdomen: No mass or other acute findings identified. A small  epigastric ventral hernia again noted which contains colon. Musculoskeletal:  No suspicious bone lesions. IMPRESSION: 1. Stable small bilateral pulmonary nodules. No new or progressive disease identified. Recommend continued follow-up by chest CT in 3-6 months. 2. Stable small left pleural effusion and left lung post radiation changes. 3. Stable 4.5 cm thoracic aortic arch aneurysm. Aortic Atherosclerosis (ICD10-I70.0) and Emphysema (ICD10-J43.9). Electronically Signed   By: Marlaine Hind M.D.   On: 01/16/2020 14:43    ASSESSMENT AND PLAN: This is a very pleasant 74 years old white male with limited stage small cell lung cancer and currently undergoing systemic chemotherapy with carboplatin and etoposide status post 4 cycles. The patient tolerated his treatment well except for the pancytopenia that was complicated by his end-stage renal disease. The patient is currently on observation and he is feeling fine today with no concerning complaints. He had repeat CT scan of the chest performed recently.  I personally and independently reviewed the scans and discussed the results with the patient and his wife today. His scan showed no concerning findings for disease recurrence or metastasis. I recommended for him to continue on observation with repeat CT scan of the chest in 6 months. The patient was advised to monitor his blood pressure closely at home and to discuss with his primary care physician adjustment of his medication if needed. He was also advised to call immediately if he has any concerning symptoms in the interval. For the end-stage renal disease, the patient will continue his hemodialysis in [place] as planned on Tuesday, Thursday and Saturdays.  The patient voices understanding of current disease status and treatment options and is in agreement with the current care plan.  All questions were answered. The patient knows to call the clinic with any problems, questions or concerns. We can  certainly see the patient much sooner if necessary.  Disclaimer: This note was dictated with voice recognition software. Similar sounding words can inadvertently be transcribed and may not be corrected upon review.

## 2020-01-20 ENCOUNTER — Telehealth: Payer: Self-pay | Admitting: Internal Medicine

## 2020-01-20 DIAGNOSIS — N2581 Secondary hyperparathyroidism of renal origin: Secondary | ICD-10-CM | POA: Diagnosis not present

## 2020-01-20 DIAGNOSIS — D631 Anemia in chronic kidney disease: Secondary | ICD-10-CM | POA: Diagnosis not present

## 2020-01-20 DIAGNOSIS — D509 Iron deficiency anemia, unspecified: Secondary | ICD-10-CM | POA: Diagnosis not present

## 2020-01-20 DIAGNOSIS — N186 End stage renal disease: Secondary | ICD-10-CM | POA: Diagnosis not present

## 2020-01-20 DIAGNOSIS — D689 Coagulation defect, unspecified: Secondary | ICD-10-CM | POA: Diagnosis not present

## 2020-01-20 DIAGNOSIS — Z992 Dependence on renal dialysis: Secondary | ICD-10-CM | POA: Diagnosis not present

## 2020-01-20 NOTE — Telephone Encounter (Signed)
Scheduled per los. Called and spoke with patient. Confirmed appt 

## 2020-01-22 DIAGNOSIS — N186 End stage renal disease: Secondary | ICD-10-CM | POA: Diagnosis not present

## 2020-01-22 DIAGNOSIS — D631 Anemia in chronic kidney disease: Secondary | ICD-10-CM | POA: Diagnosis not present

## 2020-01-22 DIAGNOSIS — D509 Iron deficiency anemia, unspecified: Secondary | ICD-10-CM | POA: Diagnosis not present

## 2020-01-22 DIAGNOSIS — D689 Coagulation defect, unspecified: Secondary | ICD-10-CM | POA: Diagnosis not present

## 2020-01-22 DIAGNOSIS — N2581 Secondary hyperparathyroidism of renal origin: Secondary | ICD-10-CM | POA: Diagnosis not present

## 2020-01-22 DIAGNOSIS — Z992 Dependence on renal dialysis: Secondary | ICD-10-CM | POA: Diagnosis not present

## 2020-01-24 DIAGNOSIS — N2581 Secondary hyperparathyroidism of renal origin: Secondary | ICD-10-CM | POA: Diagnosis not present

## 2020-01-24 DIAGNOSIS — D509 Iron deficiency anemia, unspecified: Secondary | ICD-10-CM | POA: Diagnosis not present

## 2020-01-24 DIAGNOSIS — D631 Anemia in chronic kidney disease: Secondary | ICD-10-CM | POA: Diagnosis not present

## 2020-01-24 DIAGNOSIS — Z992 Dependence on renal dialysis: Secondary | ICD-10-CM | POA: Diagnosis not present

## 2020-01-24 DIAGNOSIS — D689 Coagulation defect, unspecified: Secondary | ICD-10-CM | POA: Diagnosis not present

## 2020-01-24 DIAGNOSIS — N186 End stage renal disease: Secondary | ICD-10-CM | POA: Diagnosis not present

## 2020-01-27 DIAGNOSIS — Z992 Dependence on renal dialysis: Secondary | ICD-10-CM | POA: Diagnosis not present

## 2020-01-27 DIAGNOSIS — N2581 Secondary hyperparathyroidism of renal origin: Secondary | ICD-10-CM | POA: Diagnosis not present

## 2020-01-27 DIAGNOSIS — D509 Iron deficiency anemia, unspecified: Secondary | ICD-10-CM | POA: Diagnosis not present

## 2020-01-27 DIAGNOSIS — D631 Anemia in chronic kidney disease: Secondary | ICD-10-CM | POA: Diagnosis not present

## 2020-01-27 DIAGNOSIS — N186 End stage renal disease: Secondary | ICD-10-CM | POA: Diagnosis not present

## 2020-01-27 DIAGNOSIS — D689 Coagulation defect, unspecified: Secondary | ICD-10-CM | POA: Diagnosis not present

## 2020-01-29 DIAGNOSIS — D689 Coagulation defect, unspecified: Secondary | ICD-10-CM | POA: Diagnosis not present

## 2020-01-29 DIAGNOSIS — N2581 Secondary hyperparathyroidism of renal origin: Secondary | ICD-10-CM | POA: Diagnosis not present

## 2020-01-29 DIAGNOSIS — Z992 Dependence on renal dialysis: Secondary | ICD-10-CM | POA: Diagnosis not present

## 2020-01-29 DIAGNOSIS — D631 Anemia in chronic kidney disease: Secondary | ICD-10-CM | POA: Diagnosis not present

## 2020-01-29 DIAGNOSIS — D509 Iron deficiency anemia, unspecified: Secondary | ICD-10-CM | POA: Diagnosis not present

## 2020-01-29 DIAGNOSIS — N186 End stage renal disease: Secondary | ICD-10-CM | POA: Diagnosis not present

## 2020-01-30 DIAGNOSIS — Z992 Dependence on renal dialysis: Secondary | ICD-10-CM | POA: Diagnosis not present

## 2020-01-30 DIAGNOSIS — D689 Coagulation defect, unspecified: Secondary | ICD-10-CM | POA: Diagnosis not present

## 2020-01-30 DIAGNOSIS — D631 Anemia in chronic kidney disease: Secondary | ICD-10-CM | POA: Diagnosis not present

## 2020-01-30 DIAGNOSIS — E875 Hyperkalemia: Secondary | ICD-10-CM | POA: Diagnosis not present

## 2020-01-30 DIAGNOSIS — D509 Iron deficiency anemia, unspecified: Secondary | ICD-10-CM | POA: Diagnosis not present

## 2020-01-30 DIAGNOSIS — N186 End stage renal disease: Secondary | ICD-10-CM | POA: Diagnosis not present

## 2020-01-30 DIAGNOSIS — G2581 Restless legs syndrome: Secondary | ICD-10-CM | POA: Diagnosis not present

## 2020-01-30 DIAGNOSIS — J439 Emphysema, unspecified: Secondary | ICD-10-CM | POA: Diagnosis not present

## 2020-01-30 DIAGNOSIS — N2581 Secondary hyperparathyroidism of renal origin: Secondary | ICD-10-CM | POA: Diagnosis not present

## 2020-01-30 DIAGNOSIS — I10 Essential (primary) hypertension: Secondary | ICD-10-CM | POA: Diagnosis not present

## 2020-02-02 DIAGNOSIS — D509 Iron deficiency anemia, unspecified: Secondary | ICD-10-CM | POA: Diagnosis not present

## 2020-02-02 DIAGNOSIS — D631 Anemia in chronic kidney disease: Secondary | ICD-10-CM | POA: Diagnosis not present

## 2020-02-02 DIAGNOSIS — N186 End stage renal disease: Secondary | ICD-10-CM | POA: Diagnosis not present

## 2020-02-02 DIAGNOSIS — N2581 Secondary hyperparathyroidism of renal origin: Secondary | ICD-10-CM | POA: Diagnosis not present

## 2020-02-02 DIAGNOSIS — Z992 Dependence on renal dialysis: Secondary | ICD-10-CM | POA: Diagnosis not present

## 2020-02-02 DIAGNOSIS — D689 Coagulation defect, unspecified: Secondary | ICD-10-CM | POA: Diagnosis not present

## 2020-02-05 DIAGNOSIS — N2581 Secondary hyperparathyroidism of renal origin: Secondary | ICD-10-CM | POA: Diagnosis not present

## 2020-02-05 DIAGNOSIS — D689 Coagulation defect, unspecified: Secondary | ICD-10-CM | POA: Diagnosis not present

## 2020-02-05 DIAGNOSIS — N186 End stage renal disease: Secondary | ICD-10-CM | POA: Diagnosis not present

## 2020-02-05 DIAGNOSIS — D509 Iron deficiency anemia, unspecified: Secondary | ICD-10-CM | POA: Diagnosis not present

## 2020-02-05 DIAGNOSIS — D631 Anemia in chronic kidney disease: Secondary | ICD-10-CM | POA: Diagnosis not present

## 2020-02-05 DIAGNOSIS — Z992 Dependence on renal dialysis: Secondary | ICD-10-CM | POA: Diagnosis not present

## 2020-02-07 DIAGNOSIS — Z992 Dependence on renal dialysis: Secondary | ICD-10-CM | POA: Diagnosis not present

## 2020-02-07 DIAGNOSIS — D689 Coagulation defect, unspecified: Secondary | ICD-10-CM | POA: Diagnosis not present

## 2020-02-07 DIAGNOSIS — N2581 Secondary hyperparathyroidism of renal origin: Secondary | ICD-10-CM | POA: Diagnosis not present

## 2020-02-07 DIAGNOSIS — N186 End stage renal disease: Secondary | ICD-10-CM | POA: Diagnosis not present

## 2020-02-07 DIAGNOSIS — D631 Anemia in chronic kidney disease: Secondary | ICD-10-CM | POA: Diagnosis not present

## 2020-02-07 DIAGNOSIS — D509 Iron deficiency anemia, unspecified: Secondary | ICD-10-CM | POA: Diagnosis not present

## 2020-02-10 DIAGNOSIS — D509 Iron deficiency anemia, unspecified: Secondary | ICD-10-CM | POA: Diagnosis not present

## 2020-02-10 DIAGNOSIS — N2581 Secondary hyperparathyroidism of renal origin: Secondary | ICD-10-CM | POA: Diagnosis not present

## 2020-02-10 DIAGNOSIS — D689 Coagulation defect, unspecified: Secondary | ICD-10-CM | POA: Diagnosis not present

## 2020-02-10 DIAGNOSIS — D631 Anemia in chronic kidney disease: Secondary | ICD-10-CM | POA: Diagnosis not present

## 2020-02-10 DIAGNOSIS — N186 End stage renal disease: Secondary | ICD-10-CM | POA: Diagnosis not present

## 2020-02-10 DIAGNOSIS — Z992 Dependence on renal dialysis: Secondary | ICD-10-CM | POA: Diagnosis not present

## 2020-02-11 DIAGNOSIS — N189 Chronic kidney disease, unspecified: Secondary | ICD-10-CM | POA: Diagnosis not present

## 2020-02-11 DIAGNOSIS — I129 Hypertensive chronic kidney disease with stage 1 through stage 4 chronic kidney disease, or unspecified chronic kidney disease: Secondary | ICD-10-CM | POA: Diagnosis not present

## 2020-02-11 DIAGNOSIS — Z992 Dependence on renal dialysis: Secondary | ICD-10-CM | POA: Diagnosis not present

## 2020-02-12 DIAGNOSIS — D631 Anemia in chronic kidney disease: Secondary | ICD-10-CM | POA: Diagnosis not present

## 2020-02-12 DIAGNOSIS — N186 End stage renal disease: Secondary | ICD-10-CM | POA: Diagnosis not present

## 2020-02-12 DIAGNOSIS — D689 Coagulation defect, unspecified: Secondary | ICD-10-CM | POA: Diagnosis not present

## 2020-02-12 DIAGNOSIS — N2581 Secondary hyperparathyroidism of renal origin: Secondary | ICD-10-CM | POA: Diagnosis not present

## 2020-02-12 DIAGNOSIS — Z992 Dependence on renal dialysis: Secondary | ICD-10-CM | POA: Diagnosis not present

## 2020-02-12 DIAGNOSIS — E039 Hypothyroidism, unspecified: Secondary | ICD-10-CM | POA: Diagnosis not present

## 2020-02-12 DIAGNOSIS — D509 Iron deficiency anemia, unspecified: Secondary | ICD-10-CM | POA: Diagnosis not present

## 2020-02-14 DIAGNOSIS — D689 Coagulation defect, unspecified: Secondary | ICD-10-CM | POA: Diagnosis not present

## 2020-02-14 DIAGNOSIS — D631 Anemia in chronic kidney disease: Secondary | ICD-10-CM | POA: Diagnosis not present

## 2020-02-14 DIAGNOSIS — D509 Iron deficiency anemia, unspecified: Secondary | ICD-10-CM | POA: Diagnosis not present

## 2020-02-14 DIAGNOSIS — Z992 Dependence on renal dialysis: Secondary | ICD-10-CM | POA: Diagnosis not present

## 2020-02-14 DIAGNOSIS — E039 Hypothyroidism, unspecified: Secondary | ICD-10-CM | POA: Diagnosis not present

## 2020-02-14 DIAGNOSIS — N186 End stage renal disease: Secondary | ICD-10-CM | POA: Diagnosis not present

## 2020-02-14 DIAGNOSIS — N2581 Secondary hyperparathyroidism of renal origin: Secondary | ICD-10-CM | POA: Diagnosis not present

## 2020-02-17 DIAGNOSIS — N186 End stage renal disease: Secondary | ICD-10-CM | POA: Diagnosis not present

## 2020-02-17 DIAGNOSIS — Z992 Dependence on renal dialysis: Secondary | ICD-10-CM | POA: Diagnosis not present

## 2020-02-17 DIAGNOSIS — D509 Iron deficiency anemia, unspecified: Secondary | ICD-10-CM | POA: Diagnosis not present

## 2020-02-17 DIAGNOSIS — D689 Coagulation defect, unspecified: Secondary | ICD-10-CM | POA: Diagnosis not present

## 2020-02-17 DIAGNOSIS — E039 Hypothyroidism, unspecified: Secondary | ICD-10-CM | POA: Diagnosis not present

## 2020-02-17 DIAGNOSIS — N2581 Secondary hyperparathyroidism of renal origin: Secondary | ICD-10-CM | POA: Diagnosis not present

## 2020-02-17 DIAGNOSIS — D631 Anemia in chronic kidney disease: Secondary | ICD-10-CM | POA: Diagnosis not present

## 2020-02-19 DIAGNOSIS — E039 Hypothyroidism, unspecified: Secondary | ICD-10-CM | POA: Diagnosis not present

## 2020-02-19 DIAGNOSIS — N186 End stage renal disease: Secondary | ICD-10-CM | POA: Diagnosis not present

## 2020-02-19 DIAGNOSIS — D509 Iron deficiency anemia, unspecified: Secondary | ICD-10-CM | POA: Diagnosis not present

## 2020-02-19 DIAGNOSIS — D631 Anemia in chronic kidney disease: Secondary | ICD-10-CM | POA: Diagnosis not present

## 2020-02-19 DIAGNOSIS — Z992 Dependence on renal dialysis: Secondary | ICD-10-CM | POA: Diagnosis not present

## 2020-02-19 DIAGNOSIS — D689 Coagulation defect, unspecified: Secondary | ICD-10-CM | POA: Diagnosis not present

## 2020-02-19 DIAGNOSIS — N2581 Secondary hyperparathyroidism of renal origin: Secondary | ICD-10-CM | POA: Diagnosis not present

## 2020-02-21 DIAGNOSIS — N2581 Secondary hyperparathyroidism of renal origin: Secondary | ICD-10-CM | POA: Diagnosis not present

## 2020-02-21 DIAGNOSIS — Z992 Dependence on renal dialysis: Secondary | ICD-10-CM | POA: Diagnosis not present

## 2020-02-21 DIAGNOSIS — N186 End stage renal disease: Secondary | ICD-10-CM | POA: Diagnosis not present

## 2020-02-21 DIAGNOSIS — E039 Hypothyroidism, unspecified: Secondary | ICD-10-CM | POA: Diagnosis not present

## 2020-02-21 DIAGNOSIS — D509 Iron deficiency anemia, unspecified: Secondary | ICD-10-CM | POA: Diagnosis not present

## 2020-02-21 DIAGNOSIS — D689 Coagulation defect, unspecified: Secondary | ICD-10-CM | POA: Diagnosis not present

## 2020-02-21 DIAGNOSIS — D631 Anemia in chronic kidney disease: Secondary | ICD-10-CM | POA: Diagnosis not present

## 2020-02-24 DIAGNOSIS — D631 Anemia in chronic kidney disease: Secondary | ICD-10-CM | POA: Diagnosis not present

## 2020-02-24 DIAGNOSIS — Z992 Dependence on renal dialysis: Secondary | ICD-10-CM | POA: Diagnosis not present

## 2020-02-24 DIAGNOSIS — D509 Iron deficiency anemia, unspecified: Secondary | ICD-10-CM | POA: Diagnosis not present

## 2020-02-24 DIAGNOSIS — D689 Coagulation defect, unspecified: Secondary | ICD-10-CM | POA: Diagnosis not present

## 2020-02-24 DIAGNOSIS — N2581 Secondary hyperparathyroidism of renal origin: Secondary | ICD-10-CM | POA: Diagnosis not present

## 2020-02-24 DIAGNOSIS — N186 End stage renal disease: Secondary | ICD-10-CM | POA: Diagnosis not present

## 2020-02-24 DIAGNOSIS — E039 Hypothyroidism, unspecified: Secondary | ICD-10-CM | POA: Diagnosis not present

## 2020-02-26 DIAGNOSIS — D689 Coagulation defect, unspecified: Secondary | ICD-10-CM | POA: Diagnosis not present

## 2020-02-26 DIAGNOSIS — E039 Hypothyroidism, unspecified: Secondary | ICD-10-CM | POA: Diagnosis not present

## 2020-02-26 DIAGNOSIS — N2581 Secondary hyperparathyroidism of renal origin: Secondary | ICD-10-CM | POA: Diagnosis not present

## 2020-02-26 DIAGNOSIS — D509 Iron deficiency anemia, unspecified: Secondary | ICD-10-CM | POA: Diagnosis not present

## 2020-02-26 DIAGNOSIS — D631 Anemia in chronic kidney disease: Secondary | ICD-10-CM | POA: Diagnosis not present

## 2020-02-26 DIAGNOSIS — N186 End stage renal disease: Secondary | ICD-10-CM | POA: Diagnosis not present

## 2020-02-26 DIAGNOSIS — Z992 Dependence on renal dialysis: Secondary | ICD-10-CM | POA: Diagnosis not present

## 2020-02-28 DIAGNOSIS — N186 End stage renal disease: Secondary | ICD-10-CM | POA: Diagnosis not present

## 2020-02-28 DIAGNOSIS — D509 Iron deficiency anemia, unspecified: Secondary | ICD-10-CM | POA: Diagnosis not present

## 2020-02-28 DIAGNOSIS — Z992 Dependence on renal dialysis: Secondary | ICD-10-CM | POA: Diagnosis not present

## 2020-02-28 DIAGNOSIS — E039 Hypothyroidism, unspecified: Secondary | ICD-10-CM | POA: Diagnosis not present

## 2020-02-28 DIAGNOSIS — D689 Coagulation defect, unspecified: Secondary | ICD-10-CM | POA: Diagnosis not present

## 2020-02-28 DIAGNOSIS — D631 Anemia in chronic kidney disease: Secondary | ICD-10-CM | POA: Diagnosis not present

## 2020-02-28 DIAGNOSIS — N2581 Secondary hyperparathyroidism of renal origin: Secondary | ICD-10-CM | POA: Diagnosis not present

## 2020-03-02 DIAGNOSIS — N186 End stage renal disease: Secondary | ICD-10-CM | POA: Diagnosis not present

## 2020-03-02 DIAGNOSIS — D509 Iron deficiency anemia, unspecified: Secondary | ICD-10-CM | POA: Diagnosis not present

## 2020-03-02 DIAGNOSIS — N2581 Secondary hyperparathyroidism of renal origin: Secondary | ICD-10-CM | POA: Diagnosis not present

## 2020-03-02 DIAGNOSIS — D689 Coagulation defect, unspecified: Secondary | ICD-10-CM | POA: Diagnosis not present

## 2020-03-02 DIAGNOSIS — D631 Anemia in chronic kidney disease: Secondary | ICD-10-CM | POA: Diagnosis not present

## 2020-03-02 DIAGNOSIS — Z992 Dependence on renal dialysis: Secondary | ICD-10-CM | POA: Diagnosis not present

## 2020-03-02 DIAGNOSIS — E039 Hypothyroidism, unspecified: Secondary | ICD-10-CM | POA: Diagnosis not present

## 2020-03-04 DIAGNOSIS — N2581 Secondary hyperparathyroidism of renal origin: Secondary | ICD-10-CM | POA: Diagnosis not present

## 2020-03-04 DIAGNOSIS — Z992 Dependence on renal dialysis: Secondary | ICD-10-CM | POA: Diagnosis not present

## 2020-03-04 DIAGNOSIS — D631 Anemia in chronic kidney disease: Secondary | ICD-10-CM | POA: Diagnosis not present

## 2020-03-04 DIAGNOSIS — D509 Iron deficiency anemia, unspecified: Secondary | ICD-10-CM | POA: Diagnosis not present

## 2020-03-04 DIAGNOSIS — E039 Hypothyroidism, unspecified: Secondary | ICD-10-CM | POA: Diagnosis not present

## 2020-03-04 DIAGNOSIS — D689 Coagulation defect, unspecified: Secondary | ICD-10-CM | POA: Diagnosis not present

## 2020-03-04 DIAGNOSIS — N186 End stage renal disease: Secondary | ICD-10-CM | POA: Diagnosis not present

## 2020-03-06 DIAGNOSIS — D631 Anemia in chronic kidney disease: Secondary | ICD-10-CM | POA: Diagnosis not present

## 2020-03-06 DIAGNOSIS — Z992 Dependence on renal dialysis: Secondary | ICD-10-CM | POA: Diagnosis not present

## 2020-03-06 DIAGNOSIS — N186 End stage renal disease: Secondary | ICD-10-CM | POA: Diagnosis not present

## 2020-03-06 DIAGNOSIS — E039 Hypothyroidism, unspecified: Secondary | ICD-10-CM | POA: Diagnosis not present

## 2020-03-06 DIAGNOSIS — D509 Iron deficiency anemia, unspecified: Secondary | ICD-10-CM | POA: Diagnosis not present

## 2020-03-06 DIAGNOSIS — N2581 Secondary hyperparathyroidism of renal origin: Secondary | ICD-10-CM | POA: Diagnosis not present

## 2020-03-06 DIAGNOSIS — D689 Coagulation defect, unspecified: Secondary | ICD-10-CM | POA: Diagnosis not present

## 2020-03-09 DIAGNOSIS — Z992 Dependence on renal dialysis: Secondary | ICD-10-CM | POA: Diagnosis not present

## 2020-03-09 DIAGNOSIS — N186 End stage renal disease: Secondary | ICD-10-CM | POA: Diagnosis not present

## 2020-03-09 DIAGNOSIS — D509 Iron deficiency anemia, unspecified: Secondary | ICD-10-CM | POA: Diagnosis not present

## 2020-03-09 DIAGNOSIS — D631 Anemia in chronic kidney disease: Secondary | ICD-10-CM | POA: Diagnosis not present

## 2020-03-09 DIAGNOSIS — N2581 Secondary hyperparathyroidism of renal origin: Secondary | ICD-10-CM | POA: Diagnosis not present

## 2020-03-09 DIAGNOSIS — D689 Coagulation defect, unspecified: Secondary | ICD-10-CM | POA: Diagnosis not present

## 2020-03-09 DIAGNOSIS — E039 Hypothyroidism, unspecified: Secondary | ICD-10-CM | POA: Diagnosis not present

## 2020-03-11 DIAGNOSIS — N2581 Secondary hyperparathyroidism of renal origin: Secondary | ICD-10-CM | POA: Diagnosis not present

## 2020-03-11 DIAGNOSIS — D631 Anemia in chronic kidney disease: Secondary | ICD-10-CM | POA: Diagnosis not present

## 2020-03-11 DIAGNOSIS — D689 Coagulation defect, unspecified: Secondary | ICD-10-CM | POA: Diagnosis not present

## 2020-03-11 DIAGNOSIS — N186 End stage renal disease: Secondary | ICD-10-CM | POA: Diagnosis not present

## 2020-03-11 DIAGNOSIS — E039 Hypothyroidism, unspecified: Secondary | ICD-10-CM | POA: Diagnosis not present

## 2020-03-11 DIAGNOSIS — Z992 Dependence on renal dialysis: Secondary | ICD-10-CM | POA: Diagnosis not present

## 2020-03-11 DIAGNOSIS — D509 Iron deficiency anemia, unspecified: Secondary | ICD-10-CM | POA: Diagnosis not present

## 2020-03-12 DIAGNOSIS — I129 Hypertensive chronic kidney disease with stage 1 through stage 4 chronic kidney disease, or unspecified chronic kidney disease: Secondary | ICD-10-CM | POA: Diagnosis not present

## 2020-03-12 DIAGNOSIS — N189 Chronic kidney disease, unspecified: Secondary | ICD-10-CM | POA: Diagnosis not present

## 2020-03-12 DIAGNOSIS — Z992 Dependence on renal dialysis: Secondary | ICD-10-CM | POA: Diagnosis not present

## 2020-03-13 DIAGNOSIS — N186 End stage renal disease: Secondary | ICD-10-CM | POA: Diagnosis not present

## 2020-03-13 DIAGNOSIS — N2581 Secondary hyperparathyroidism of renal origin: Secondary | ICD-10-CM | POA: Diagnosis not present

## 2020-03-13 DIAGNOSIS — D631 Anemia in chronic kidney disease: Secondary | ICD-10-CM | POA: Diagnosis not present

## 2020-03-13 DIAGNOSIS — D689 Coagulation defect, unspecified: Secondary | ICD-10-CM | POA: Diagnosis not present

## 2020-03-13 DIAGNOSIS — Z992 Dependence on renal dialysis: Secondary | ICD-10-CM | POA: Diagnosis not present

## 2020-03-16 DIAGNOSIS — D689 Coagulation defect, unspecified: Secondary | ICD-10-CM | POA: Diagnosis not present

## 2020-03-16 DIAGNOSIS — Z992 Dependence on renal dialysis: Secondary | ICD-10-CM | POA: Diagnosis not present

## 2020-03-16 DIAGNOSIS — D631 Anemia in chronic kidney disease: Secondary | ICD-10-CM | POA: Diagnosis not present

## 2020-03-16 DIAGNOSIS — N186 End stage renal disease: Secondary | ICD-10-CM | POA: Diagnosis not present

## 2020-03-16 DIAGNOSIS — N2581 Secondary hyperparathyroidism of renal origin: Secondary | ICD-10-CM | POA: Diagnosis not present

## 2020-03-18 DIAGNOSIS — Z992 Dependence on renal dialysis: Secondary | ICD-10-CM | POA: Diagnosis not present

## 2020-03-18 DIAGNOSIS — D631 Anemia in chronic kidney disease: Secondary | ICD-10-CM | POA: Diagnosis not present

## 2020-03-18 DIAGNOSIS — N186 End stage renal disease: Secondary | ICD-10-CM | POA: Diagnosis not present

## 2020-03-18 DIAGNOSIS — N2581 Secondary hyperparathyroidism of renal origin: Secondary | ICD-10-CM | POA: Diagnosis not present

## 2020-03-18 DIAGNOSIS — D689 Coagulation defect, unspecified: Secondary | ICD-10-CM | POA: Diagnosis not present

## 2020-03-20 DIAGNOSIS — N2581 Secondary hyperparathyroidism of renal origin: Secondary | ICD-10-CM | POA: Diagnosis not present

## 2020-03-20 DIAGNOSIS — D631 Anemia in chronic kidney disease: Secondary | ICD-10-CM | POA: Diagnosis not present

## 2020-03-20 DIAGNOSIS — Z992 Dependence on renal dialysis: Secondary | ICD-10-CM | POA: Diagnosis not present

## 2020-03-20 DIAGNOSIS — D689 Coagulation defect, unspecified: Secondary | ICD-10-CM | POA: Diagnosis not present

## 2020-03-20 DIAGNOSIS — N186 End stage renal disease: Secondary | ICD-10-CM | POA: Diagnosis not present

## 2020-03-23 DIAGNOSIS — D689 Coagulation defect, unspecified: Secondary | ICD-10-CM | POA: Diagnosis not present

## 2020-03-23 DIAGNOSIS — N186 End stage renal disease: Secondary | ICD-10-CM | POA: Diagnosis not present

## 2020-03-23 DIAGNOSIS — D631 Anemia in chronic kidney disease: Secondary | ICD-10-CM | POA: Diagnosis not present

## 2020-03-23 DIAGNOSIS — N2581 Secondary hyperparathyroidism of renal origin: Secondary | ICD-10-CM | POA: Diagnosis not present

## 2020-03-23 DIAGNOSIS — Z992 Dependence on renal dialysis: Secondary | ICD-10-CM | POA: Diagnosis not present

## 2020-03-24 DIAGNOSIS — E785 Hyperlipidemia, unspecified: Secondary | ICD-10-CM | POA: Diagnosis not present

## 2020-03-24 DIAGNOSIS — Z79899 Other long term (current) drug therapy: Secondary | ICD-10-CM | POA: Diagnosis not present

## 2020-03-25 DIAGNOSIS — Z992 Dependence on renal dialysis: Secondary | ICD-10-CM | POA: Diagnosis not present

## 2020-03-25 DIAGNOSIS — D689 Coagulation defect, unspecified: Secondary | ICD-10-CM | POA: Diagnosis not present

## 2020-03-25 DIAGNOSIS — N2581 Secondary hyperparathyroidism of renal origin: Secondary | ICD-10-CM | POA: Diagnosis not present

## 2020-03-25 DIAGNOSIS — D631 Anemia in chronic kidney disease: Secondary | ICD-10-CM | POA: Diagnosis not present

## 2020-03-25 DIAGNOSIS — N186 End stage renal disease: Secondary | ICD-10-CM | POA: Diagnosis not present

## 2020-03-27 DIAGNOSIS — Z992 Dependence on renal dialysis: Secondary | ICD-10-CM | POA: Diagnosis not present

## 2020-03-27 DIAGNOSIS — D631 Anemia in chronic kidney disease: Secondary | ICD-10-CM | POA: Diagnosis not present

## 2020-03-27 DIAGNOSIS — N2581 Secondary hyperparathyroidism of renal origin: Secondary | ICD-10-CM | POA: Diagnosis not present

## 2020-03-27 DIAGNOSIS — N186 End stage renal disease: Secondary | ICD-10-CM | POA: Diagnosis not present

## 2020-03-27 DIAGNOSIS — D689 Coagulation defect, unspecified: Secondary | ICD-10-CM | POA: Diagnosis not present

## 2020-03-30 DIAGNOSIS — N186 End stage renal disease: Secondary | ICD-10-CM | POA: Diagnosis not present

## 2020-03-30 DIAGNOSIS — Z992 Dependence on renal dialysis: Secondary | ICD-10-CM | POA: Diagnosis not present

## 2020-03-30 DIAGNOSIS — D631 Anemia in chronic kidney disease: Secondary | ICD-10-CM | POA: Diagnosis not present

## 2020-03-30 DIAGNOSIS — N2581 Secondary hyperparathyroidism of renal origin: Secondary | ICD-10-CM | POA: Diagnosis not present

## 2020-03-30 DIAGNOSIS — D689 Coagulation defect, unspecified: Secondary | ICD-10-CM | POA: Diagnosis not present

## 2020-03-31 DIAGNOSIS — E785 Hyperlipidemia, unspecified: Secondary | ICD-10-CM | POA: Diagnosis not present

## 2020-03-31 DIAGNOSIS — I712 Thoracic aortic aneurysm, without rupture: Secondary | ICD-10-CM | POA: Diagnosis not present

## 2020-03-31 DIAGNOSIS — C3491 Malignant neoplasm of unspecified part of right bronchus or lung: Secondary | ICD-10-CM | POA: Diagnosis not present

## 2020-03-31 DIAGNOSIS — R1310 Dysphagia, unspecified: Secondary | ICD-10-CM | POA: Diagnosis not present

## 2020-03-31 DIAGNOSIS — N186 End stage renal disease: Secondary | ICD-10-CM | POA: Diagnosis not present

## 2020-04-01 DIAGNOSIS — Z992 Dependence on renal dialysis: Secondary | ICD-10-CM | POA: Diagnosis not present

## 2020-04-01 DIAGNOSIS — N186 End stage renal disease: Secondary | ICD-10-CM | POA: Diagnosis not present

## 2020-04-01 DIAGNOSIS — D689 Coagulation defect, unspecified: Secondary | ICD-10-CM | POA: Diagnosis not present

## 2020-04-01 DIAGNOSIS — N2581 Secondary hyperparathyroidism of renal origin: Secondary | ICD-10-CM | POA: Diagnosis not present

## 2020-04-01 DIAGNOSIS — D631 Anemia in chronic kidney disease: Secondary | ICD-10-CM | POA: Diagnosis not present

## 2020-04-03 DIAGNOSIS — N186 End stage renal disease: Secondary | ICD-10-CM | POA: Diagnosis not present

## 2020-04-03 DIAGNOSIS — N2581 Secondary hyperparathyroidism of renal origin: Secondary | ICD-10-CM | POA: Diagnosis not present

## 2020-04-03 DIAGNOSIS — Z992 Dependence on renal dialysis: Secondary | ICD-10-CM | POA: Diagnosis not present

## 2020-04-03 DIAGNOSIS — D689 Coagulation defect, unspecified: Secondary | ICD-10-CM | POA: Diagnosis not present

## 2020-04-03 DIAGNOSIS — D631 Anemia in chronic kidney disease: Secondary | ICD-10-CM | POA: Diagnosis not present

## 2020-04-05 ENCOUNTER — Emergency Department (HOSPITAL_COMMUNITY): Payer: Medicare Other

## 2020-04-05 ENCOUNTER — Encounter: Payer: Self-pay | Admitting: Gastroenterology

## 2020-04-05 ENCOUNTER — Inpatient Hospital Stay (HOSPITAL_COMMUNITY)
Admission: EM | Admit: 2020-04-05 | Discharge: 2020-04-06 | DRG: 189 | Disposition: A | Discharge status: Home / Self Care | Payer: Medicare Other | Attending: Family Medicine | Admitting: Family Medicine

## 2020-04-05 ENCOUNTER — Other Ambulatory Visit: Payer: Self-pay

## 2020-04-05 ENCOUNTER — Encounter (HOSPITAL_COMMUNITY): Payer: Self-pay

## 2020-04-05 DIAGNOSIS — Z87891 Personal history of nicotine dependence: Secondary | ICD-10-CM

## 2020-04-05 DIAGNOSIS — Z79899 Other long term (current) drug therapy: Secondary | ICD-10-CM | POA: Diagnosis not present

## 2020-04-05 DIAGNOSIS — Z955 Presence of coronary angioplasty implant and graft: Secondary | ICD-10-CM

## 2020-04-05 DIAGNOSIS — Z85118 Personal history of other malignant neoplasm of bronchus and lung: Secondary | ICD-10-CM

## 2020-04-05 DIAGNOSIS — Z09 Encounter for follow-up examination after completed treatment for conditions other than malignant neoplasm: Secondary | ICD-10-CM

## 2020-04-05 DIAGNOSIS — E785 Hyperlipidemia, unspecified: Secondary | ICD-10-CM | POA: Diagnosis not present

## 2020-04-05 DIAGNOSIS — D631 Anemia in chronic kidney disease: Secondary | ICD-10-CM | POA: Diagnosis not present

## 2020-04-05 DIAGNOSIS — Z923 Personal history of irradiation: Secondary | ICD-10-CM

## 2020-04-05 DIAGNOSIS — K219 Gastro-esophageal reflux disease without esophagitis: Secondary | ICD-10-CM | POA: Diagnosis present

## 2020-04-05 DIAGNOSIS — E8889 Other specified metabolic disorders: Secondary | ICD-10-CM | POA: Diagnosis present

## 2020-04-05 DIAGNOSIS — J9601 Acute respiratory failure with hypoxia: Secondary | ICD-10-CM | POA: Diagnosis not present

## 2020-04-05 DIAGNOSIS — Z992 Dependence on renal dialysis: Secondary | ICD-10-CM

## 2020-04-05 DIAGNOSIS — I252 Old myocardial infarction: Secondary | ICD-10-CM | POA: Diagnosis not present

## 2020-04-05 DIAGNOSIS — I509 Heart failure, unspecified: Secondary | ICD-10-CM | POA: Diagnosis present

## 2020-04-05 DIAGNOSIS — E039 Hypothyroidism, unspecified: Secondary | ICD-10-CM | POA: Diagnosis present

## 2020-04-05 DIAGNOSIS — I10 Essential (primary) hypertension: Secondary | ICD-10-CM | POA: Diagnosis not present

## 2020-04-05 DIAGNOSIS — I2510 Atherosclerotic heart disease of native coronary artery without angina pectoris: Secondary | ICD-10-CM | POA: Diagnosis present

## 2020-04-05 DIAGNOSIS — Z7982 Long term (current) use of aspirin: Secondary | ICD-10-CM

## 2020-04-05 DIAGNOSIS — R0602 Shortness of breath: Secondary | ICD-10-CM | POA: Diagnosis not present

## 2020-04-05 DIAGNOSIS — M109 Gout, unspecified: Secondary | ICD-10-CM | POA: Diagnosis present

## 2020-04-05 DIAGNOSIS — Z79890 Hormone replacement therapy (postmenopausal): Secondary | ICD-10-CM | POA: Diagnosis not present

## 2020-04-05 DIAGNOSIS — N189 Chronic kidney disease, unspecified: Secondary | ICD-10-CM | POA: Diagnosis present

## 2020-04-05 DIAGNOSIS — J8410 Pulmonary fibrosis, unspecified: Secondary | ICD-10-CM | POA: Diagnosis not present

## 2020-04-05 DIAGNOSIS — I132 Hypertensive heart and chronic kidney disease with heart failure and with stage 5 chronic kidney disease, or end stage renal disease: Secondary | ICD-10-CM | POA: Diagnosis not present

## 2020-04-05 DIAGNOSIS — N186 End stage renal disease: Secondary | ICD-10-CM | POA: Diagnosis not present

## 2020-04-05 DIAGNOSIS — R918 Other nonspecific abnormal finding of lung field: Secondary | ICD-10-CM

## 2020-04-05 DIAGNOSIS — N250 Renal osteodystrophy: Secondary | ICD-10-CM | POA: Diagnosis not present

## 2020-04-05 DIAGNOSIS — Z9221 Personal history of antineoplastic chemotherapy: Secondary | ICD-10-CM | POA: Diagnosis not present

## 2020-04-05 DIAGNOSIS — I119 Hypertensive heart disease without heart failure: Secondary | ICD-10-CM | POA: Diagnosis not present

## 2020-04-05 DIAGNOSIS — Z794 Long term (current) use of insulin: Secondary | ICD-10-CM

## 2020-04-05 DIAGNOSIS — J439 Emphysema, unspecified: Secondary | ICD-10-CM | POA: Diagnosis not present

## 2020-04-05 DIAGNOSIS — Z20822 Contact with and (suspected) exposure to covid-19: Secondary | ICD-10-CM | POA: Diagnosis present

## 2020-04-05 DIAGNOSIS — Z801 Family history of malignant neoplasm of trachea, bronchus and lung: Secondary | ICD-10-CM

## 2020-04-05 DIAGNOSIS — C3490 Malignant neoplasm of unspecified part of unspecified bronchus or lung: Secondary | ICD-10-CM | POA: Diagnosis not present

## 2020-04-05 DIAGNOSIS — J449 Chronic obstructive pulmonary disease, unspecified: Secondary | ICD-10-CM | POA: Diagnosis not present

## 2020-04-05 DIAGNOSIS — J9621 Acute and chronic respiratory failure with hypoxia: Secondary | ICD-10-CM | POA: Diagnosis not present

## 2020-04-05 LAB — CBC WITH DIFFERENTIAL/PLATELET
Abs Immature Granulocytes: 0.02 10*3/uL (ref 0.00–0.07)
Basophils Absolute: 0 10*3/uL (ref 0.0–0.1)
Basophils Relative: 1 %
Eosinophils Absolute: 0 10*3/uL (ref 0.0–0.5)
Eosinophils Relative: 1 %
HCT: 34.7 % — ABNORMAL LOW (ref 39.0–52.0)
Hemoglobin: 11.4 g/dL — ABNORMAL LOW (ref 13.0–17.0)
Immature Granulocytes: 0 %
Lymphocytes Relative: 14 %
Lymphs Abs: 1 10*3/uL (ref 0.7–4.0)
MCH: 32 pg (ref 26.0–34.0)
MCHC: 32.9 g/dL (ref 30.0–36.0)
MCV: 97.5 fL (ref 80.0–100.0)
Monocytes Absolute: 0.6 10*3/uL (ref 0.1–1.0)
Monocytes Relative: 9 %
Neutro Abs: 4.9 10*3/uL (ref 1.7–7.7)
Neutrophils Relative %: 75 %
Platelets: 181 10*3/uL (ref 150–400)
RBC: 3.56 MIL/uL — ABNORMAL LOW (ref 4.22–5.81)
RDW: 15.9 % — ABNORMAL HIGH (ref 11.5–15.5)
WBC: 6.6 10*3/uL (ref 4.0–10.5)
nRBC: 0 % (ref 0.0–0.2)

## 2020-04-05 LAB — BASIC METABOLIC PANEL
Anion gap: 14 (ref 5–15)
BUN: 34 mg/dL — ABNORMAL HIGH (ref 8–23)
CO2: 26 mmol/L (ref 22–32)
Calcium: 8.9 mg/dL (ref 8.9–10.3)
Chloride: 94 mmol/L — ABNORMAL LOW (ref 98–111)
Creatinine, Ser: 8.63 mg/dL — ABNORMAL HIGH (ref 0.61–1.24)
GFR calc Af Amer: 6 mL/min — ABNORMAL LOW (ref >60)
GFR calc non Af Amer: 5 mL/min — ABNORMAL LOW (ref >60)
Glucose, Bld: 141 mg/dL — ABNORMAL HIGH (ref 70–99)
Potassium: 4.1 mmol/L (ref 3.5–5.1)
Sodium: 134 mmol/L — ABNORMAL LOW (ref 135–145)

## 2020-04-05 LAB — POC SARS CORONAVIRUS 2 AG -  ED: SARS Coronavirus 2 Ag: NEGATIVE

## 2020-04-05 LAB — SARS CORONAVIRUS 2 BY RT PCR (HOSPITAL ORDER, PERFORMED IN ~~LOC~~ HOSPITAL LAB): SARS Coronavirus 2: NEGATIVE

## 2020-04-05 MED ORDER — SODIUM CHLORIDE 0.9 % IV SOLN: 250.0000 mL | INTRAVENOUS | Status: DC | PRN

## 2020-04-05 MED ORDER — ACETAMINOPHEN 325 MG PO TABS: 650.0000 mg | ORAL_TABLET | Freq: Four times a day (QID) | ORAL | Status: DC | PRN

## 2020-04-05 MED ORDER — FUROSEMIDE 10 MG/ML IJ SOLN: 40.0000 mg | Freq: Once | INTRAMUSCULAR | Status: DC

## 2020-04-05 MED ORDER — CHLORHEXIDINE GLUCONATE CLOTH 2 % EX PADS: 6.0000 | MEDICATED_PAD | Freq: Every day | CUTANEOUS | Status: DC

## 2020-04-05 MED ORDER — SODIUM CHLORIDE 0.9% FLUSH
3.0000 mL | Freq: Two times a day (BID) | INTRAVENOUS | Status: DC
  Administered 2020-04-06: 3 mL via INTRAVENOUS

## 2020-04-05 MED ORDER — METHYLPREDNISOLONE SODIUM SUCC 125 MG IJ SOLR
125.0000 mg | Freq: Once | INTRAMUSCULAR | Status: AC
  Administered 2020-04-06: 125 mg via INTRAVENOUS
  Filled 2020-04-05: qty 2

## 2020-04-05 MED ORDER — POLYETHYLENE GLYCOL 3350 17 G PO PACK: 17.0000 g | PACK | Freq: Every day | ORAL | Status: DC | PRN

## 2020-04-05 MED ORDER — ENOXAPARIN SODIUM 30 MG/0.3ML ~~LOC~~ SOLN: 30.0000 mg | SUBCUTANEOUS | Status: DC

## 2020-04-05 MED ORDER — ONDANSETRON HCL 4 MG/2ML IJ SOLN: 4.0000 mg | Freq: Four times a day (QID) | INTRAMUSCULAR | Status: DC | PRN

## 2020-04-05 MED ORDER — ACETAMINOPHEN 650 MG RE SUPP: 650.0000 mg | Freq: Four times a day (QID) | RECTAL | Status: DC | PRN

## 2020-04-05 MED ORDER — FUROSEMIDE 10 MG/ML IJ SOLN: 80.0000 mg | Freq: Once | INTRAMUSCULAR | Status: DC

## 2020-04-05 MED ORDER — NITROGLYCERIN 0.4 MG SL SUBL
0.4000 mg | SUBLINGUAL_TABLET | Freq: Once | SUBLINGUAL | Status: AC
  Administered 2020-04-05: 0.4 mg via SUBLINGUAL
  Filled 2020-04-05: qty 1

## 2020-04-05 MED ORDER — VANCOMYCIN HCL 750 MG/150ML IV SOLN
750.0000 mg | INTRAVENOUS | Status: DC
  Administered 2020-04-06 (×2): 750 mg via INTRAVENOUS
  Filled 2020-04-05: qty 150

## 2020-04-05 MED ORDER — HEPARIN SODIUM (PORCINE) 5000 UNIT/ML IJ SOLN
5000.0000 [IU] | Freq: Three times a day (TID) | INTRAMUSCULAR | Status: DC
  Administered 2020-04-06: 5000 [IU] via SUBCUTANEOUS
  Filled 2020-04-05: qty 1

## 2020-04-05 MED ORDER — IOHEXOL 350 MG/ML SOLN
100.0000 mL | Freq: Once | INTRAVENOUS | Status: AC | PRN
  Administered 2020-04-05: 100 mL via INTRAVENOUS

## 2020-04-05 MED ORDER — VANCOMYCIN HCL 1500 MG/300ML IV SOLN
1500.0000 mg | Freq: Once | INTRAVENOUS | Status: AC
  Administered 2020-04-06: 1500 mg via INTRAVENOUS
  Filled 2020-04-05: qty 300

## 2020-04-05 MED ORDER — NITROGLYCERIN IN D5W 200-5 MCG/ML-% IV SOLN: 0.0000 ug/min | INTRAVENOUS | Status: DC

## 2020-04-05 MED ORDER — SODIUM CHLORIDE 0.9% FLUSH: 3.0000 mL | INTRAVENOUS | Status: DC | PRN

## 2020-04-05 MED ORDER — ONDANSETRON HCL 4 MG PO TABS: 4.0000 mg | ORAL_TABLET | Freq: Four times a day (QID) | ORAL | Status: DC | PRN

## 2020-04-05 MED ORDER — SODIUM CHLORIDE 0.9 % IV SOLN
1.0000 g | INTRAVENOUS | Status: DC
  Administered 2020-04-06: 1 g via INTRAVENOUS
  Filled 2020-04-05 (×3): qty 1

## 2020-04-05 NOTE — Progress Notes (Signed)
Was called by Dr. Jonnie Finner that he was admitting this patient who is TTS HD patient at [facility].  The patient is in more respiratory distress throughout the afternoon, has worsened to needing bipap.  Imaging showing fluid.  Will go ahead and do dialysis this evening with [facility] to see if oxygenation will improve  Reg HD TTS 4 hours-  3 K/2.5 calc- EDW 76.5 (but losing due to his cancer)  AVG- 15 guage- gets heparin  [facility] will do full consult tomorrow   Louis Meckel

## 2020-04-05 NOTE — ED Notes (Signed)
Notified EDP that Pt. Was complaining of shortness of breath upon returning from CT. Assessed O2 pt. Was not maintaining O2 sat above 89. EDP requested a phone call to respiratory therapy.

## 2020-04-05 NOTE — ED Notes (Signed)
Respiratory therapy at pt. Bedside.

## 2020-04-05 NOTE — ED Provider Notes (Signed)
  Face-to-face evaluation   History: SOB, with decreased O2 sats.  He noticed this last night at home.  He dialyzed 2 days ago as per usual.  He denies change diet.  He denies fever.  He has had occasional cough not producing sputum.  Physical exam: Alert, calm and cooperative.  JVD present.  Lungs with bilateral rales, left greater than right.  Graft left upper arm, with good pulsation, nontender to palpation.  Medical screening examination/treatment/procedure(s) were conducted as a shared visit with non-physician practitioner(s) and myself.  I personally evaluated the patient during the encounter    Daleen Bo, MD 04/06/20 [place]

## 2020-04-05 NOTE — ED Provider Notes (Signed)
[facility] EMERGENCY DEPARTMENT Provider Note   CSN: [id number] Arrival date & time: 04/05/20  1133     History Chief Complaint  Patient presents with  . Shortness of Breath    Jeremy Johnson is a 74 y.o. male with history of small cell lung cancer, ESRD on dialysis Tuesday Thursday Saturday, COPD, GERD, hypertension, hyperlipidemia, CAD, hypothyroidism presenting for evaluation of acute onset, progressively worsening shortness of breath.  Reports he is currently not undergoing any kind of chemotherapy, is currently in observation.  Working with his oncologist Dr. Julien Nordmann.  He states he has been feeling a bit more short of breath over the last 3 weeks and developed a recurrent dry cough.  He denies fevers or exposures to Covid.  Reports he is fully vaccinated against Covid.  He was last dialyzed on Saturday and underwent the full treatment without difficulty.  He states that last night in the middle of the night he began to feel more short of breath and could not lay flat so he went downstairs to lay on his couch.  He states that he checked his oxygen saturations with his pulse oximeter at home and noted them to be in the 70s.  He used his rescue inhaler and had improvement to the 80s.  He reports he is typically not dependent on any supplemental oxygen with normal oxygen saturations around 95% on room air.  Today he noticed dyspnea on exertion.  He is a former smoker, quit in 2018.  Denies chest pain, leg swelling or pain.  The history is provided by the patient.       Past Medical History:  Diagnosis Date  . Anemia   . Blood transfusion without reported diagnosis   . CAD (coronary artery disease)    STENT... MID CIRCUMFLEX...1997  . Chronic kidney disease    STAGE 3  . COPD (chronic obstructive pulmonary disease) ([place])   . Degenerative joint disease (DJD) of lumbar spine   . GERD (gastroesophageal reflux disease)   . Gout   . Hyperlipidemia   . Hypertension   . Hypothyroidism     . Incisional hernia    abdomen  . Leukocytosis    CHRONIC MILD  . Myocardial infarction ([place])    1997  . SCL CA dx'd 01/2019   Lung cancer    Patient Active Problem List   Diagnosis Date Noted  . Acute and chronic respiratory failure with hypoxia ([place]) 04/05/2020  . Chronic obstructive pulmonary disease ([place])   . Anaphylactic reaction due to adverse effect of correct drug or medicament properly administered, initial encounter 07/30/2019  . Hypocalcemia 05/31/2019  . Other disorders of phosphorus metabolism 05/31/2019  . Pancytopenia ([place]) 05/22/2019  . ESRD (end stage renal disease) on dialysis ([place]) 05/22/2019  . Unspecified protein-calorie malnutrition ([place]) 05/02/2019  . Coagulation defect, unspecified ([place]) 04/26/2019  . Diarrhea, unspecified 04/26/2019  . Dyspnea, unspecified 04/26/2019  . Hypokalemia 04/26/2019  . Pain, unspecified 04/26/2019  . Pruritus, unspecified 04/26/2019  . Secondary hyperparathyroidism of renal origin ([place]) 04/26/2019  . Encounter for immunization 04/26/2019  . Incisional hernia without obstruction or gangrene 04/25/2019  . Malignant neoplasm of unspecified part of unspecified bronchus or lung ([place]) 04/25/2019  . Other specified degenerative diseases of nervous system ([place]) 04/25/2019  . Anemia in chronic kidney disease 04/25/2019  . Atherosclerotic heart disease of native coronary artery without angina pectoris 04/25/2019  . Gastro-esophageal reflux disease without esophagitis 04/25/2019  . Hypothyroidism, unspecified 04/25/2019  . Acute renal failure  superimposed on stage 3 chronic kidney disease ([place]) 04/16/2019  . Anemia in chronic kidney disease (CKD) 04/14/2019  . Small cell lung cancer ([place]) 02/06/2019  . Encounter for antineoplastic chemotherapy 02/06/2019  . Goals of care, counseling/discussion 02/06/2019  . Special screening for malignant neoplasms, colon   . GERD (gastroesophageal reflux disease)   . Hyperlipidemia   . Gout   .  Hypothyroidism   . Degenerative joint disease (DJD) of lumbar spine   . HYPOTHYROIDISM 02/02/2010  . HLD (hyperlipidemia) 02/02/2010  . Essential hypertension 02/02/2010  . GERD 02/02/2010  . ABDOMINAL AORTIC ANEURYSM REPAIR, HX OF 02/02/2010    Past Surgical History:  Procedure Laterality Date  . ABDOMINAL AORTIC ANEURYSM REPAIR  2006  . AV FISTULA PLACEMENT Left 04/25/2019   Procedure: ARTERIOVENOUS (AV) FISTULA CREATION LEFT ARM;  Surgeon: Angelia Mould, MD;  Location: [place];  Service: Vascular;  Laterality: Left;  . AV FISTULA PLACEMENT Left 05/19/2019   Procedure: CONVERSION OF LEFT ARM ARTERIOVENOUS FISTULA TO GRAFT;  Surgeon: Angelia Mould, MD;  Location: [place];  Service: Vascular;  Laterality: Left;  . BIOPSY  09/30/2018   Procedure: BIOPSY;  Surgeon: Danie Binder, MD;  [facility];  Service: Endoscopy;;  ascending colon  . COLONOSCOPY  2008  . COLONOSCOPY N/A 09/30/2018   Procedure: COLONOSCOPY;  Surgeon: Danie Binder, MD;  [facility];  Service: Endoscopy;  Laterality: N/A;  9:00  . CORONARY ANGIOPLASTY WITH STENT [place]  . IR FLUORO GUIDE CV LINE RIGHT  04/22/2019  . IR US GUIDE VASC ACCESS RIGHT  04/22/2019  . POLYPECTOMY  09/30/2018   Procedure: POLYPECTOMY;  Surgeon: Danie Binder, MD;  [facility];  Service: Endoscopy;;  colon  . VIDEO BRONCHOSCOPY WITH ENDOBRONCHIAL NAVIGATION N/A 01/27/2019   Procedure: VIDEO BRONCHOSCOPY WITH ENDOBRONCHIAL NAVIGATION;  Surgeon: Grace Isaac, MD;  Location: [place];  Service: Thoracic;  Laterality: N/A;  . VIDEO BRONCHOSCOPY WITH ENDOBRONCHIAL ULTRASOUND N/A 01/27/2019   Procedure: VIDEO BRONCHOSCOPY WITH ENDOBRONCHIAL ULTRASOUND;  Surgeon: Grace Isaac, MD;  Location: [facility] OR;  Service: Thoracic;  Laterality: N/A;       Family History  Problem Relation Age of Onset  . Stroke Brother   . Lung cancer Sister 22       lung cancer/former     Social History   Tobacco Use  . Smoking status: Former Smoker    Packs/day: 0.50    Years: 54.00    Pack years: 27.00    Quit date: 09/17/2017    Years since quitting: 2.5  . Smokeless tobacco: Never Used  Substance Use Topics  . Alcohol use: Yes    Comment: occasional  . Drug use: No    Home Medications Prior to Admission medications   Medication Sig Start Date End Date Taking? Authorizing Provider  acetaminophen (TYLENOL) 325 MG tablet Take 2 tablets (650 mg total) by mouth every 6 (six) hours as needed for mild pain (or Fever >/= 101). 05/25/19  Yes Swayze, Ava, DO  albuterol (VENTOLIN HFA) 108 (90 Base) MCG/ACT inhaler Inhale 1-2 puffs into the lungs every 6 (six) hours as needed for wheezing or shortness of breath.  02/02/20  Yes [provider]  allopurinol (ZYLOPRIM) 100 MG tablet Take 1 tablet (100 mg total) by mouth daily. Patient taking differently: Take 100 mg by mouth in the morning.  04/26/19  Yes Mercy Riding, MD  amLODipine (NORVASC)  10 MG tablet Take 10 mg by mouth at bedtime.  08/16/19  Yes [provider]  aspirin EC 81 MG tablet Take 81 mg by mouth at bedtime.   Yes [provider]  cloNIDine (CATAPRES) 0.1 MG tablet Take 0.1 mg by mouth 2 (two) times daily.   Yes [provider]  Darbepoetin Alfa (ARANESP) 100 MCG/0.5ML SOSY injection Inject 0.5 mLs (100 mcg total) into the vein every Thursday with hemodialysis. 05/01/19  Yes Mercy Riding, MD  folic acid-vitamin b complex-vitamin c-selenium-zinc (DIALYVITE) 3 MG TABS tablet Take 1 tablet by mouth in the morning.   Yes [provider]  levocetirizine (XYZAL) 5 MG tablet Take 5 mg by mouth every evening.  09/04/19  Yes [provider]  levothyroxine (SYNTHROID, LEVOTHROID) 175 MCG tablet Take 175 mcg by mouth daily before breakfast.    Yes [provider]  lidocaine-prilocaine (EMLA) cream Apply 1 application topically Every Tuesday,Thursday,and Saturday  with dialysis.  06/24/19  Yes [provider]  Methoxy PEG-Epoetin Beta (MIRCERA IJ) Inject 30 mg into the vein every 28 (twenty-eight) days.  03/23/20 03/22/21 Yes [provider]  metoprolol tartrate (LOPRESSOR) 50 MG tablet Take 25 mg by mouth 2 (two) times daily.    Yes [provider]  omeprazole (PRILOSEC) 40 MG capsule Take 40 mg by mouth in the morning.  08/15/19  Yes [provider]  polyethylene glycol (MIRALAX / GLYCOLAX) 17 g packet Take 17 g by mouth daily. Patient taking differently: Take 17 g by mouth daily as needed for mild constipation or moderate constipation.  05/26/19  Yes Swayze, Ava, DO  pramipexole (MIRAPEX) 0.25 MG tablet Take 0.25 mg by mouth at bedtime. 03/31/20  Yes [provider]  rosuvastatin (CRESTOR) 10 MG tablet Take 10 mg by mouth at bedtime. 01/30/20  Yes [provider]    Allergies    Penicillins  Review of Systems   Review of Systems  Constitutional: Negative for chills and fever.  Respiratory: Positive for cough and shortness of breath.   Cardiovascular: Negative for chest pain and leg swelling.  Gastrointestinal: Negative for abdominal pain, nausea and vomiting.  All other systems reviewed and are negative.   Physical Exam Updated Vital Signs BP (!) 154/78   Pulse 90   Temp (!) 97.4 F (36.3 C)   Resp (!) 22   Ht 5\' 9"  (1.753 m)   Wt 76.2 kg   SpO2 95%   BMI 24.81 kg/m   Physical Exam Vitals and nursing note reviewed.  Constitutional:      General: He is not in acute distress.    Appearance: He is well-developed.  HENT:     Head: Normocephalic and atraumatic.  Eyes:     General:        Right eye: No discharge.        Left eye: No discharge.     Conjunctiva/sclera: Conjunctivae normal.  Neck:     Vascular: No JVD.     Trachea: No tracheal deviation.  Cardiovascular:     Rate and Rhythm: Normal rate and regular rhythm.     Comments: 2+ DP/PT pulses bilaterally, Homans' sign absent  bilaterally, no palpable cords.  Trace pitting edema of the bilateral lower extremities.  AV fistula to the left upper extremity with palpable thrill Pulmonary:     Effort: Tachypnea present.     Breath sounds: Examination of the right-lower field reveals rales. Examination of the left-lower field reveals rales. Rales present.  Comments: SPO2 saturations 88 to 92% on 4 L supplemental oxygen via nasal cannula.  Drops to 88% with sitting upright.  Speaking in mostly full sentences but some shorter phrases.  Bibasilar crackles noted. Abdominal:     General: Bowel sounds are normal. There is no distension.     Palpations: Abdomen is soft.     Tenderness: There is no abdominal tenderness.  Musculoskeletal:     Right lower leg: Edema present.     Left lower leg: Edema present.  Skin:    General: Skin is warm and dry.     Findings: No erythema.  Neurological:     Mental Status: He is alert.  Psychiatric:        Behavior: Behavior normal.     ED Results / Procedures / Treatments   Labs (all labs ordered are listed, but only abnormal results are displayed) Labs Reviewed  BASIC METABOLIC PANEL - Abnormal; Notable for the following components:      Result Value   Sodium 134 (*)    Chloride 94 (*)    Glucose, Bld 141 (*)    BUN 34 (*)    Creatinine, Ser 8.63 (*)    GFR calc non Af Amer 5 (*)    GFR calc Af Amer 6 (*)    All other components within normal limits  CBC WITH DIFFERENTIAL/PLATELET - Abnormal; Notable for the following components:   RBC 3.56 (*)    Hemoglobin 11.4 (*)    HCT 34.7 (*)    RDW 15.9 (*)    All other components within normal limits  SARS CORONAVIRUS 2 BY RT PCR (HOSPITAL ORDER, [place] [facility])  COMPREHENSIVE METABOLIC PANEL  CBC  POC SARS CORONAVIRUS 2 AG -  ED    EKG None  Radiology DG Chest 2 View  Result Date: 04/05/2020 CLINICAL DATA:  Shortness of breath EXAM: CHEST - 2 VIEW COMPARISON:  Chest CT January 16, 2020; chest  radiograph May 22, 2019 FINDINGS: There is fibrosis throughout the lungs bilaterally with patchy airspace opacity in the left upper lobe an each lung base. Apparent radiation therapy change left perihilar region. Heart is upper normal in size with pulmonary vascularity normal. No adenopathy. There is aortic atherosclerosis. Aneurysmal dilatation in the aortic arch region is better seen on recent CT. No adenopathy. No bone lesions. IMPRESSION: Underlying fibrosis. Radiation therapy fibrosis noted in the left perihilar region. Patchy airspace opacity in the right base laterally, concerning for focal pneumonia. Airspace opacity in the left upper lobe and left base are similar to March 2021 study and may represent combination of scarring and residual infiltrate. Concern for a degree of multifocal pneumonia with superimposed scarring and fibrosis. Stable cardiac silhouette. No adenopathy appreciable. Aortic Atherosclerosis (ICD10-I70.0). Electronically Signed   By: Lowella Grip III M.D.   On: 04/05/2020 13:06   CT Angio Chest PE W and/or Wo Contrast  Result Date: 04/05/2020 CLINICAL DATA:  Intermittent shortness of breath for 1 month, worsening over last several days, orthopnea, hypoxia, history of non-small cell lung cancer status post chemotherapy and radiation therapy EXAM: CT ANGIOGRAPHY CHEST WITH CONTRAST TECHNIQUE: Multidetector CT imaging of the chest was performed using the standard protocol during bolus administration of intravenous contrast. Multiplanar CT image reconstructions and MIPs were obtained to evaluate the vascular anatomy. CONTRAST:  125mL OMNIPAQUE IOHEXOL 350 MG/ML SOLN COMPARISON:  01/16/2020, 12/05/2019 FINDINGS: Cardiovascular: This is a technically adequate evaluation of the pulmonary vasculature. No filling defects or pulmonary  emboli. The heart is enlarged, with prominent left ventricular dilatation. No pericardial effusion. Focal aneurysmal dilatation of the proximal aortic arch  again noted, measuring up to 4.6 cm in greatest diameter, stable. No evidence of dissection. There is diffuse atherosclerosis of the aorta and coronary vessels. Mediastinum/Nodes: No enlarged mediastinal, hilar, or axillary lymph nodes. Thyroid gland, trachea, and esophagus demonstrate no significant findings. Lungs/Pleura: Upper lobe predominant scarring fibrosis is noted. Likely post radiation change within the left upper lobe, stable. There is superimposed interlobular septal thickening and bilateral airspace disease, along with bilateral pleural effusions. Findings are consistent with congestive heart failure. No pneumothorax. There are areas of compressive atelectasis within the bilateral lower lobes. The subcentimeter pulmonary nodule seen previously are not well visualized on this exam due to underlying consolidation. Upper Abdomen: No acute abnormality. Musculoskeletal: No acute displaced fractures. Reconstructed images demonstrate no additional findings. Review of the MIP images confirms the above findings. IMPRESSION: 1. No evidence of pulmonary embolus. 2. Findings consistent with congestive heart failure superimposed upon background scarring and fibrosis. 3. Stable 4.6 cm aneurysmal dilatation of the proximal aortic arch. No evidence of dissection. 4. Post therapeutic changes left upper lobe likely related to prior radiation therapy for lung cancer. Stable appearance since prior studies. 5. Aortic Atherosclerosis (ICD10-I70.0) and Emphysema (ICD10-J43.9). Electronically Signed   By: Randa Ngo M.D.   On: 04/05/2020 17:09    Procedures .Critical Care Performed by: Renita Papa, PA-C Authorized by: Renita Papa, PA-C   Critical care provider statement:    Critical care time (minutes):  60   Critical care was necessary to treat or prevent imminent or life-threatening deterioration of the following conditions:  Respiratory failure   Critical care was time spent personally by me on the  following activities:  Discussions with consultants, evaluation of patient's response to treatment, examination of patient, ordering and performing treatments and interventions, ordering and review of laboratory studies, ordering and review of radiographic studies, pulse oximetry, re-evaluation of patient's condition, obtaining history from patient or surrogate and review of old charts   (including critical care time)  Medications Ordered in ED Medications  methylPREDNISolone sodium succinate (SOLU-MEDROL) 125 mg/2 mL injection 125 mg (has no administration in time range)  furosemide (LASIX) injection 80 mg (has no administration in time range)  nitroGLYCERIN 50 mg in dextrose 5 % 250 mL (0.2 mg/mL) infusion (has no administration in time range)  enoxaparin (LOVENOX) injection 30 mg (has no administration in time range)  sodium chloride flush (NS) 0.9 % injection 3 mL (has no administration in time range)  sodium chloride flush (NS) 0.9 % injection 3 mL (has no administration in time range)  0.9 %  sodium chloride infusion (has no administration in time range)  acetaminophen (TYLENOL) tablet 650 mg (has no administration in time range)    Or  acetaminophen (TYLENOL) suppository 650 mg (has no administration in time range)  polyethylene glycol (MIRALAX / GLYCOLAX) packet 17 g (has no administration in time range)  ondansetron (ZOFRAN) tablet 4 mg (has no administration in time range)    Or  ondansetron (ZOFRAN) injection 4 mg (has no administration in time range)  Chlorhexidine Gluconate Cloth 2 % PADS 6 each (has no administration in time range)  iohexol (OMNIPAQUE) 350 MG/ML injection 100 mL (100 mLs Intravenous Contrast Given 04/05/20 1651)  nitroGLYCERIN (NITROSTAT) SL tablet 0.4 mg (0.4 mg Sublingual Given 04/05/20 1804)    ED Course  I have reviewed the triage vital signs and the  nursing notes.  Pertinent labs & imaging results that were available during my care of the patient were  reviewed by me and considered in my medical decision making (see chart for details).    MDM Rules/Calculators/A&P                      Jeremy Johnson was evaluated in Emergency Department on 04/05/2020 for the symptoms described in the history of present illness. He was evaluated in the context of the global COVID-19 pandemic, which necessitated consideration that the patient might be at risk for infection with the SARS-CoV-2 virus that causes COVID-19. Institutional protocols and algorithms that pertain to the evaluation of patients at risk for COVID-19 are in a state of rapid change based on information released by regulatory bodies including the CDC and federal and state organizations. These policies and algorithms were followed during the patient's care in the ED.  Patient with history of small cell lung cancer currently undergoing observation period presents for evaluation of shortness of breath.  Reports mild cough for 3 weeks but shortness of breath suddenly worsened last night.  Presents hypoxic with O2 saturations in the 70s with some improvement on nasal cannula but no higher than 88% on 6 L supplemental oxygen via nasal cannula.  Respiratory therapy was consulted and he was placed on high flow nasal cannula with improvement up to 94% at 80% O2.  Lab work reviewed and interpreted by myself shows no leukocytosis, stable anemia, renal insufficiency consistent with history of ESRD on dialysis.  He reports compliance with dialysis, was last dialyzed fully on Saturday.  Chest x-ray shows underlying fibrosis with concern for possible new patchy airspace opacity in the right lung base, potentially multifocal pneumonia?  1:48PM CONSULT: Spoke with Dr. Delton Coombes with oncology who recommends touching base with the patient's primary oncologist Dr. Earlie Server for further recommendations.  However he does state that if the patient is admitted here and requires any oncology -related care, he would be happy  to see the patient in consultation.  3:07PM CONSULT: Spoke with Dr. Julien Nordmann, patient's oncologist.  He recommends obtaining advanced imaging for further evaluation of the patient's abnormal chest x-ray and hypoxia to determine whether or not his symptoms are secondary to edema, infection, or possible PE.  He recommends proceeding with CTA of the chest if the patient can tolerate contrast.  He also recommends admission and feels it is reasonable to keep the patient at [facility].   3:30 PM CONSULT: Spoke with Dr. Moshe Cipro with nephrology.  She suspects it is unlikely for the patient to come off of dialysis at this point.  She advises that it is okay to proceed with CTA of the chest to evaluate for possible PE.  PE study shows no evidence of PE but findings suggestive of CHF superimposed upon background scarring and fibrosis per radiology read.  I spoke with Dr. Jonnie Finner with [facility] hospitalist service who agrees to assume care of patient and bring him to hospital for further evaluation and management.   While in the ED after returning from [place], patient developed increased work of breathing, SPO2 saturations down to 84% on high flow nasal cannula.  Respiratory therapy was consulted and he was given a tablet of sublingual nitroglycerin with some improvement.  Subsequently he was placed on BiPAP with improvement in O2 saturations and work of breathing.  Patient seen and evaluate by Dr. Eulis Foster as well as Dr. Sedonia Small in the ED who agree with  assessment and plan at this time.  Final Clinical Impression(s) / ED Diagnoses Final diagnoses:  Acute respiratory failure with hypoxia [facility])    Rx / DC Orders ED Discharge Orders    None       Debroah Baller 04/05/20 2019    Daleen Bo, MD 04/06/20 1905

## 2020-04-05 NOTE — ED Notes (Addendum)
Notified EDP that pts. O2 was 81 and that Pt. Had a respiratory rate of 30 and was gasping for air. Notified EDP that the pts. High flow was at 12 l/m. EDP requested Respiratory therapy. Respiratory therapy was called.

## 2020-04-05 NOTE — ED Notes (Signed)
Pt. With CT. 

## 2020-04-05 NOTE — ED Notes (Signed)
Respiratory therapy notified of pt O2 sats.

## 2020-04-05 NOTE — Progress Notes (Signed)
Pharmacy Antibiotic Note  Jeremy Johnson is a 74 y.o. male admitted on 04/05/2020 respiratory distress, suspected PNA. Pt has w/ hx of SCCa of lung, COPD, and pulm fibrosis.  Pharmacy has been consulted for cefepime and vancomycin dosing. Pt is noted to have PCN allergy but reaction is just rash.  Plan: Cefepime 1 gm IV q 24 hr. Vancomycin 1500 mg IV load and 750 mg IV after each full dialysis session.   Height: 5\' 9"  (175.3 cm) Weight: 76.2 kg (168 lb) IBW/kg (Calculated) : 70.7  Temp (24hrs), Avg:97.5 F (36.4 C), Min:97.4 F (36.3 C), Max:97.6 F (36.4 C)  Recent Labs  Lab 04/05/20 1247  WBC 6.6  CREATININE 8.63*    Estimated Creatinine Clearance: 7.6 mL/min (A) (by C-G formula based on SCr of 8.63 mg/dL (H)).    Allergies  Allergen Reactions  . Penicillins Rash and Other (See Comments)    Has patient had a PCN reaction causing immediate rash, facial/tongue/throat swelling, SOB or lightheadedness with hypotension: No Has patient had a PCN reaction causing severe rash involving mucus membranes or skin necrosis: No Has patient had a PCN reaction that required hospitalization: No Has patient had a PCN reaction occurring within the last 10 years: No If all of the above answers are "NO", then may proceed with Cephalosporin use.     Antimicrobials this admission:  Cefepime 5/24>> Vancomycin 5/24>>   Thank you for allowing pharmacy to be a part of this patient's care.  Blenda Nicely 04/05/2020 9:12 PM

## 2020-04-05 NOTE — H&P (Deleted)
[facility] History & Physical  Rob Doctor, hospital MD  TARYN NAVE 04/05/2020  Chief Complaint: SOB HPI: The patient is a 74 y.o. year-old w/ hx of SCCa of lung (off chemo now, last chemoRx fall 2020), ESRD on HD x 1 yr [facility] TTS, HTN, HL, gout, COPD, pulm fibrosis, CAD sp stent comes to ED w/ SOB.  No fever, chills no prod cough, + dry cough, slight leg edema. May be losing wt, he is not sure.  SOB x 1-2 days, worse today. No CP.  Last HD Sat 2 days ago, next due tomorrow.  Asked to see for admit.    [name] and provided most of hx.  Pt failed HFNC so is now on bipap and wob is starting to settle down.      ROS  denies CP  no joint pain   no HA  no blurry vision  no rash  no diarrhea  no nausea/ vomiting     Past Medical History  Past Medical History:  Diagnosis Date  . Allergy   . Arthritis    neck  . Cataract    bilateral - MD monitoring cataracts  . CHF (congestive heart failure) ([place])   . Chronic kidney disease, stage I    DR OTTELIN  HX UTIS  . Cirrhosis ([place])   . Cramp of limb   . Diabetes mellitus   . Dysphagia, unspecified(787.20)   . Dysuria   . Epistaxis   . GERD (gastroesophageal reflux disease)   . Heart murmur    NO CARDIOLOGIST  DX FOR YEARS ASYMPTOMATIC  . Lumbago   . Neoplasm of uncertain behavior of skin   . Nonspecific elevation of levels of transaminase or lactic acid dehydrogenase (LDH)   . Osteoarthrosis, unspecified whether generalized or localized, unspecified site   . Other and unspecified hyperlipidemia    diet controlled  . Pain in joint, shoulder region   . Paresthesias 10/06/2015  . Postablative ovarian failure   . Trochanteric bursitis of left hip 06/20/2016  . Type 2 diabetes mellitus without complication ([place])   . Unspecified essential hypertension    no meds   Past Surgical History  Past Surgical History:  Procedure Laterality Date  . BREAST BIOPSY    . CARDIAC CATHETERIZATION N/A  08/02/2016   Procedure: Left Heart Cath and Coronary Angiography;  Surgeon: Belva Crome, MD;  Location: [place] [facility];  Service: Cardiovascular;  Laterality: N/A;  . COLONOSCOPY  2012   Dr Lajoyce Corners.   . COLONOSCOPY WITH PROPOFOL N/A 01/11/2017   Procedure: COLONOSCOPY WITH PROPOFOL;  Surgeon: Milus Banister, MD;  Location: [facility];  Service: Endoscopy;  Laterality: N/A;  . CORONARY ARTERY BYPASS GRAFT N/A 08/03/2016   Procedure: CORONARY ARTERY BYPASS GRAFTING (CABG) x 3 USING RIGHT LEG GREATER SAPHENOUS VEIN GRAFT;  Surgeon: Melrose Nakayama, MD;  Location: [place];  Service: Open Heart Surgery;  Laterality: N/A;  . ENDOVEIN HARVEST OF GREATER SAPHENOUS VEIN Right 08/03/2016   Procedure: ENDOVEIN HARVEST OF GREATER SAPHENOUS VEIN;  Surgeon: Melrose Nakayama, MD;  Location: [place];  Service: Open Heart Surgery;  Laterality: Right;  . ESOPHAGEAL BANDING  10/02/2019   Procedure: ESOPHAGEAL BANDING;  Surgeon: Milus Banister, MD;  Location: [facility];  Service: Endoscopy;;  . ESOPHAGEAL BANDING  10/12/2019   Procedure: ESOPHAGEAL BANDING;  Surgeon: Juanita Craver, MD;  Location: [facility];  Service: Endoscopy;;  . ESOPHAGOGASTRODUODENOSCOPY N/A 10/12/2019   Procedure:  ESOPHAGOGASTRODUODENOSCOPY (EGD);  Surgeon: Juanita Craver, MD;  Location: [facility] ENDOSCOPY;  Service: Endoscopy;  Laterality: N/A;  . ESOPHAGOGASTRODUODENOSCOPY (EGD) WITH PROPOFOL N/A 01/11/2017   Procedure: ESOPHAGOGASTRODUODENOSCOPY (EGD) WITH PROPOFOL;  Surgeon: Milus Banister, MD;  Location: [facility];  Service: Endoscopy;  Laterality: N/A;  . ESOPHAGOGASTRODUODENOSCOPY (EGD) WITH PROPOFOL N/A 10/02/2019   Procedure: ESOPHAGOGASTRODUODENOSCOPY (EGD) WITH PROPOFOL;  Surgeon: Milus Banister, MD;  [facility];  Service: Endoscopy;  Laterality: N/A;  . HEMOSTASIS CLIP PLACEMENT  10/12/2019   Procedure: HEMOSTASIS CLIP PLACEMENT;  Surgeon: Juanita Craver, MD;  Location: [place] [facility];  Service: Endoscopy;;  . IR  ANGIOGRAM SELECTIVE EACH ADDITIONAL VESSEL  10/13/2019  . IR EMBO ART  VEN HEMORR LYMPH EXTRAV  INC GUIDE ROADMAPPING  10/13/2019  . IR PARACENTESIS  10/13/2019  . IR TIPS  10/13/2019  . MAXIMUM ACCESS (MAS)POSTERIOR LUMBAR INTERBODY FUSION (PLIF) 1 LEVEL Left 12/15/2015   Procedure: FOR MAXIMUM ACCESS (MAS) POSTERIOR LUMBAR INTERBODY FUSION (PLIF) LUMBAR THREE-FOUR EXTRAFORAMINAL MICRODISCECTOMY LUMBAR FIVE-SACRAL ONE LEFT;  Surgeon: Eustace Moore, MD;  Location: [place] [facility];  Service: Neurosurgery;  Laterality: Left;  . RADIOLOGY WITH ANESTHESIA N/A 10/13/2019   Procedure: RADIOLOGY WITH ANESTHESIA;  Surgeon: Radiologist, Medication, MD;  Location: [place];  Service: Radiology;  Laterality: N/A;  . SCLEROTHERAPY  10/12/2019   Procedure: SCLEROTHERAPY;  Surgeon: Juanita Craver, MD;  Location: [facility] ENDOSCOPY;  Service: Endoscopy;;  . TEE WITHOUT CARDIOVERSION N/A 08/03/2016   Procedure: TRANSESOPHAGEAL ECHOCARDIOGRAM (TEE);  Surgeon: Melrose Nakayama, MD;  Location: [place];  Service: Open Heart Surgery;  Laterality: N/A;  . TUBAL LIGATION  1982   Dr Connye Burkitt  . UPPER GASTROINTESTINAL ENDOSCOPY    . VAGINAL HYSTERECTOMY  1997   Dr Rande Lawman   Family History  Family History  Problem Relation Age of Onset  . Lung cancer Father   . Arthritis Sister   . Arthritis Brother   . Heart disease Maternal Grandmother   . Heart disease Maternal Grandfather   . Heart disease Paternal Grandmother   . Heart disease Paternal Grandfather   . Breast cancer Mother   . Liver cancer Brother   . Breast cancer Maternal Aunt   . Breast cancer Paternal Aunt   . Colon cancer Neg Hx   . Esophageal cancer Neg Hx   . Rectal cancer Neg Hx   . Stomach cancer Neg Hx    Social History  reports that she has never smoked. She has never used smokeless tobacco. She reports that she does not drink alcohol or use drugs. Allergies  Allergies  Allergen Reactions  . Kiwi Extract Anaphylaxis  . Tdap [Tetanus-Diphth-Acell  Pertussis] Swelling and Other (See Comments)    Swelling at injection site, gets very hot  . Statins     RHABDOMYOLYSIS  . Latex Itching, Dermatitis and Rash  . Tramadol Nausea And Vomiting   Home medications Prior to Admission medications   Medication Sig Start Date End Date Taking? Authorizing Provider  acetaminophen (TYLENOL) 500 MG tablet Take 500 mg by mouth at bedtime.     [provider]  Aromatic Inhalants (VICKS VAPOR IN) Vicks Vapor Rub apply small amount to outside of nose to help breathing    [provider]  BD PEN NEEDLE NANO U/F 32G X 4 MM MISC USE THREE TIMES DAILY AS DIRECTED 06/02/19   Reed, Tiffany L, DO  Biotin 10000 MCG TABS Take 10,000 mcg by mouth every morning.    [provider]  bisacodyl (  DULCOLAX) 10 MG suppository Place 1 suppository (10 mg total) rectally daily as needed for moderate constipation. 10/17/19   Aline August, MD  calcium carbonate (OS-CAL) 600 MG TABS Take 600 mg by mouth 2 (two) times daily with a meal.      [provider]  Cholecalciferol (VITAMIN D) 50 MCG (2000 UT) CAPS Take 2,000 Units by mouth daily.     [provider]  Cyanocobalamin (VITAMIN B 12 PO) Take 1,000 mcg by mouth daily.      [provider]  ezetimibe (ZETIA) 10 MG tablet TAKE 1 TABLET(10 MG) BY MOUTH DAILY 07/02/19   Reed, Tiffany L, DO  furosemide (LASIX) 40 MG tablet Take 40 mg by mouth daily.     [provider]  glucose blood test strip One Touch Ultra II strips. Use to test blood sugar three times daily. Dx: E11.65 12/06/17   Reed, Tiffany L, DO  insulin detemir (LEVEMIR) 100 UNIT/ML injection Inject 0.2 mLs (20 Units total) into the skin at bedtime. 10/18/19   Aline August, MD  Insulin Syringe-Needle U-100 (INSULIN SYRINGE 1CC/31GX5/16") 31G X 5/16" 1 ML MISC USE AS DIRECTED DAILY WITH LEVEMIR 01/31/19   Reed, Tiffany L, DO  JARDIANCE 25 MG TABS tablet Take 25 mg by mouth daily. 11/04/19   [provider]   lactulose (CHRONULAC) 10 GM/15ML solution Take 20 g by mouth 3 (three) times daily. 11/08/19   [provider]  loratadine (CLARITIN) 10 MG tablet Take 10 mg by mouth daily as needed for allergies.    [provider]  MAGNESIUM PO Take 500 mg by mouth 2 (two) times daily in the am and at bedtime..    [provider]  Multiple Vitamins-Minerals (MULTIVITAMIN WITH MINERALS) tablet Take 1 tablet by mouth daily.      [provider]  NOVOLOG FLEXPEN 100 UNIT/ML FlexPen Inject 12 units under the skin every morning, 8 units at lunch and 12 units at supper 08/26/19   Reed, Tiffany L, DO  ondansetron (ZOFRAN) 4 MG tablet Take 1 tablet (4 mg total) by mouth every 6 (six) hours as needed for nausea. 10/17/19   Aline August, MD  pantoprazole (PROTONIX) 40 MG tablet Take 1 tablet (40 mg total) by mouth 2 (two) times daily. 10/17/19   Aline August, MD  Polyethyl Glycol-Propyl Glycol (SYSTANE OP) Place 1 drop into both eyes 2 (two) times daily.    [provider]  Probiotic Product (PROBIOTIC DAILY PO) Take 1 capsule by mouth daily. Digestive Advantage Probiotic    [provider]  spironolactone (ALDACTONE) 50 MG tablet Take 1 tablet (50 mg total) by mouth 2 (two) times daily. 10/20/19   Gayland Curry, DO   Liver Function Tests Recent Labs  Lab 11/17/19 1436 11/19/19 1042  AST 58* 62*  ALT 41* 45*  ALKPHOS  --  127*  BILITOT 3.5* 3.4*  PROT 7.8 7.2  ALBUMIN  --  3.0*   Recent Labs  Lab 11/19/19 1042  LIPASE 29   CBC Recent Labs  Lab 11/17/19 1436 11/19/19 1042 11/19/19 1056  WBC 10.4 7.7  --   NEUTROABS 8,299* 5.9  --   HGB 16.1* 14.9 15.0  HCT 47.2* 43.4 44.0  MCV 92.5 94.6  --   PLT 151 116*  --    Basic Metabolic Panel Recent Labs  Lab 11/17/19 1436 11/19/19 1042 11/19/19 1056  NA 133* 131* 133*  K 4.5 4.6 4.6  CL 96* 97*  --  CO2 24 23  --   GLUCOSE 269* 138*  --   BUN 19 20  --   CREATININE 1.05* 1.13*  --    CALCIUM 11.1* 10.0  --    Iron/TIBC/Ferritin/ %Sat    Component Value Date/Time   IRON 29 11/11/2016 1422   TIBC 427 11/11/2016 1422   FERRITIN 13 11/11/2016 1422   IRONPCTSAT 7 (L) 11/11/2016 1422    Vitals:   11/19/19 1018 11/19/19 1030 11/19/19 1045 11/19/19 1215  BP: (!) 123/51 (!) 101/46  (!) 125/54  Pulse: 89 83    Resp: 16 15  20   Temp: 97.8 F (36.6 C)  97.9 F (36.6 C)   TempSrc: Oral  Rectal   SpO2: 99% 98%      Exam Gen on bipap, ^'d wob, no cyanosis No rash, cyanosis or gangrene Sclera anicteric, throat not seen  JVD to 12 cm Chest velcro rales 1/2 up on R, L basilar rales, no bronch BS or wheezing RRR no MRG Abd soft ntnd no mass or ascites +bs GU normal male MS no joint effusions or deformity Ext 1+ pretib edema, no wounds or ulcers Neuro is alert, Ox 3 , nf LUA AVG+bruit    Home meds:  - norvasc 10/ clonidine 0.1 bid/ metoprolol 25 bid  - mirapex 0.125 tid  - allopurinol 100 qd/ synthroid 200 ug/ prilosec 40 qd/ zocor 20 hs  - prn's/ vitamins/ supplements    Assessment/ Plan: 1. SOB/ acute on chronic resp failure w/ hypoxia - underlying COPD w/ pulm fibrosis by CT, hx SCCa lung cancer last chemo fall 2020.  Unclear cause of acute SOB.  CTA of chest w/o PE.  Radiology reading suggests fluid excess.  Perhaps there is some consolidation in the bases but not mentioned on the reading.  No PNA symptoms.  Will plan IV ntg and bipap to bridge pt to HD a bit later tonight. Admit to ICU.  Is getting IV solumedrol x 1 empirically w/ hx COPD.  Will start empiric IV abx that may then be withdrawn if CXR / patient improve rapidly w/ HD/ solumedrol.  2. ESRD - HD TTS. Renal aware, they will see tomorrow, have d/w them.  3. HTN - IV ntg for now, resume home meds after HD.  4. SCCa lung - f/b Dr Julien Nordmann.  Last chemo fall 2020, recent CT looked good in March per pts wife.  5. Gout 6. CAD hx of stent    Kelly Splinter, MD   [facility] 11/19/2019, 1:10 PM

## 2020-04-05 NOTE — ED Triage Notes (Signed)
Pt reports that he has intermittent SOB for at last one month. Pt reports worsening last night. Pt reports sats in 70's this am. During triage noted to to save RA sat 73 % and placed on 3 L via [place] with increase to 93 %

## 2020-04-05 NOTE — Progress Notes (Signed)
Pt placed on 7L [place] HFNC.

## 2020-04-05 NOTE — ED Notes (Signed)
Dialysis nurse at bedside

## 2020-04-06 ENCOUNTER — Inpatient Hospital Stay (HOSPITAL_COMMUNITY): Payer: Medicare Other

## 2020-04-06 DIAGNOSIS — J9601 Acute respiratory failure with hypoxia: Secondary | ICD-10-CM

## 2020-04-06 LAB — COMPREHENSIVE METABOLIC PANEL
ALT: 25 U/L (ref 0–44)
AST: 32 U/L (ref 15–41)
Albumin: 3.5 g/dL (ref 3.5–5.0)
Alkaline Phosphatase: 86 U/L (ref 38–126)
Anion gap: 14 (ref 5–15)
BUN: 21 mg/dL (ref 8–23)
CO2: 23 mmol/L (ref 22–32)
Calcium: 8.8 mg/dL — ABNORMAL LOW (ref 8.9–10.3)
Chloride: 97 mmol/L — ABNORMAL LOW (ref 98–111)
Creatinine, Ser: 5.72 mg/dL — ABNORMAL HIGH (ref 0.61–1.24)
GFR calc Af Amer: 10 mL/min — ABNORMAL LOW (ref >60)
GFR calc non Af Amer: 9 mL/min — ABNORMAL LOW (ref >60)
Glucose, Bld: 119 mg/dL — ABNORMAL HIGH (ref 70–99)
Potassium: 4.8 mmol/L (ref 3.5–5.1)
Sodium: 134 mmol/L — ABNORMAL LOW (ref 135–145)
Total Bilirubin: 1.2 mg/dL (ref 0.3–1.2)
Total Protein: 7.6 g/dL (ref 6.5–8.1)

## 2020-04-06 LAB — CBC
HCT: 36.4 % — ABNORMAL LOW (ref 39.0–52.0)
Hemoglobin: 11.9 g/dL — ABNORMAL LOW (ref 13.0–17.0)
MCH: 32.4 pg (ref 26.0–34.0)
MCHC: 32.7 g/dL (ref 30.0–36.0)
MCV: 99.2 fL (ref 80.0–100.0)
Platelets: 167 10*3/uL (ref 150–400)
RBC: 3.67 MIL/uL — ABNORMAL LOW (ref 4.22–5.81)
RDW: 15.9 % — ABNORMAL HIGH (ref 11.5–15.5)
WBC: 8.3 10*3/uL (ref 4.0–10.5)
nRBC: 0 % (ref 0.0–0.2)

## 2020-04-06 MED ORDER — CHLORHEXIDINE GLUCONATE CLOTH 2 % EX PADS: 6.0000 | MEDICATED_PAD | Freq: Every day | CUTANEOUS | Status: DC

## 2020-04-06 MED ORDER — LORATADINE 10 MG PO TABS
10.0000 mg | ORAL_TABLET | Freq: Every day | ORAL | Status: DC
  Filled 2020-04-06: qty 1

## 2020-04-06 MED ORDER — SODIUM CHLORIDE 0.9 % IV SOLN: 100.0000 mL | INTRAVENOUS | Status: DC | PRN

## 2020-04-06 MED ORDER — PRAMIPEXOLE DIHYDROCHLORIDE 0.25 MG PO TABS
0.1250 mg | ORAL_TABLET | Freq: Three times a day (TID) | ORAL | Status: DC
  Administered 2020-04-06 (×2): 0.125 mg via ORAL
  Filled 2020-04-06 (×4): qty 1

## 2020-04-06 MED ORDER — SIMVASTATIN 10 MG PO TABS: 20.0000 mg | ORAL_TABLET | Freq: Every day | ORAL | Status: DC

## 2020-04-06 MED ORDER — PREDNISONE 20 MG PO TABS: 40.0000 mg | ORAL_TABLET | Freq: Every day | ORAL | 0 refills | Status: DC

## 2020-04-06 MED ORDER — LIDOCAINE-PRILOCAINE 2.5-2.5 % EX CREA: TOPICAL_CREAM | Freq: Once | CUTANEOUS | Status: DC

## 2020-04-06 MED ORDER — PANTOPRAZOLE SODIUM 40 MG PO TBEC
40.0000 mg | DELAYED_RELEASE_TABLET | Freq: Every day | ORAL | Status: DC
  Administered 2020-04-06: 40 mg via ORAL
  Filled 2020-04-06: qty 1

## 2020-04-06 MED ORDER — LIDOCAINE-PRILOCAINE 2.5-2.5 % EX CREA: 1.0000 "application " | TOPICAL_CREAM | CUTANEOUS | Status: DC | PRN

## 2020-04-06 MED ORDER — LIDOCAINE HCL (PF) 1 % IJ SOLN: 5.0000 mL | INTRAMUSCULAR | Status: DC | PRN

## 2020-04-06 MED ORDER — LEVOTHYROXINE SODIUM 50 MCG PO TABS
200.0000 ug | ORAL_TABLET | Freq: Every day | ORAL | Status: DC
  Administered 2020-04-06: 200 ug via ORAL
  Filled 2020-04-06: qty 4

## 2020-04-06 MED ORDER — PENTAFLUOROPROP-TETRAFLUOROETH EX AERO: 1.0000 "application " | INHALATION_SPRAY | CUTANEOUS | Status: DC | PRN

## 2020-04-06 MED ORDER — PREDNISONE 50 MG PO TABS
50.0000 mg | ORAL_TABLET | Freq: Once | ORAL | Status: AC
  Administered 2020-04-06: 50 mg via ORAL
  Filled 2020-04-06: qty 1

## 2020-04-06 MED ORDER — MUCINEX 600 MG PO TB12: 600.0000 mg | ORAL_TABLET | Freq: Two times a day (BID) | ORAL | 0 refills | Status: AC

## 2020-04-06 MED ORDER — FLUTICASONE-SALMETEROL 250-50 MCG/DOSE IN AEPB: 1.0000 | INHALATION_SPRAY | Freq: Two times a day (BID) | RESPIRATORY_TRACT | 1 refills | Status: DC

## 2020-04-06 MED ORDER — RENA-VITE PO TABS
1.0000 | ORAL_TABLET | Freq: Every day | ORAL | Status: DC
  Administered 2020-04-06: 1 via ORAL
  Filled 2020-04-06 (×2): qty 1

## 2020-04-06 MED ORDER — ALLOPURINOL 100 MG PO TABS
100.0000 mg | ORAL_TABLET | Freq: Every day | ORAL | Status: DC
  Administered 2020-04-06: 100 mg via ORAL
  Filled 2020-04-06 (×2): qty 1

## 2020-04-06 NOTE — Clinical Social Work Note (Signed)
Transition of Care [facility]) - Emergency Department Mini Assessment  Patient Details  Name: Jeremy Johnson MRN: [id number] Date of Birth: 1946-09-30  [facility]) CM/SW Contact:    Sherie Don, LCSW Phone Number: 04/06/2020, 4:42 PM  Clinical Narrative: Patient is a 74 year old male who presented at the ED for shortness of breath. TOC received consult for HH/DME needs. Juliann Pulse called with [employer] to make referral for home O2. Sat note has been added. TOC signing off.  ED Mini Assessment: What brought you to the Emergency Department? : Shortness of breath Barriers to Discharge: ED Barriers Resolved Barrier interventions: Referral to [employer] for home O2 Means of departure: Car Interventions which prevented an admission or readmission: DME Provided  Patient Contact and Communications Key Contact 1: Blake Divine with [employer] Spoke with: [name] Date: 04/06/20,     Contact Phone Number: [phone] Call outcome: [employer] will provide home O2 to allow patient to discharge Patient states their goals for this hospitalization and ongoing recovery are:: Return home CMS Medicare.gov Compare Post Acute Care list provided to:: Patient Choice offered to / list presented to : Patient  Admission diagnosis:  Acute and chronic respiratory failure with hypoxia ([place]) [J96.21] Patient Active Problem List   Diagnosis Date Noted  . Acute respiratory failure with hypoxia ([place]) 04/06/2020  . Acute and chronic respiratory failure with hypoxia ([place]) 04/05/2020  . Chronic obstructive pulmonary disease/emphysema   . Anaphylactic reaction due to adverse effect of correct drug or medicament properly administered, initial encounter 07/30/2019  . Hypocalcemia 05/31/2019  . Other disorders of phosphorus metabolism 05/31/2019  . Pancytopenia ([place]) 05/22/2019  . ESRD (end stage renal disease) on dialysis ([place]) 05/22/2019  . Unspecified protein-calorie malnutrition ([place]) 05/02/2019  . Coagulation  defect, unspecified ([place]) 04/26/2019  . Diarrhea, unspecified 04/26/2019  . Dyspnea, unspecified 04/26/2019  . Hypokalemia 04/26/2019  . Pain, unspecified 04/26/2019  . Pruritus, unspecified 04/26/2019  . Secondary hyperparathyroidism of renal origin ([place]) 04/26/2019  . Encounter for immunization 04/26/2019  . Incisional hernia without obstruction or gangrene 04/25/2019  . Malignant neoplasm of unspecified part of unspecified bronchus or lung ([place]) 04/25/2019  . Other specified degenerative diseases of nervous system ([place]) 04/25/2019  . Anemia in chronic kidney disease 04/25/2019  . Atherosclerotic heart disease of native coronary artery without angina pectoris 04/25/2019  . Gastro-esophageal reflux disease without esophagitis 04/25/2019  . Hypothyroidism, unspecified 04/25/2019  . Acute renal failure superimposed on stage 3 chronic kidney disease ([place]) 04/16/2019  . Anemia in chronic kidney disease (CKD) 04/14/2019  . Small cell lung cancer ([place]) 02/06/2019  . Encounter for antineoplastic chemotherapy 02/06/2019  . Goals of care, counseling/discussion 02/06/2019  . Special screening for malignant neoplasms, colon   . GERD (gastroesophageal reflux disease)   . Hyperlipidemia   . Gout   . Hypothyroidism   . Degenerative joint disease (DJD) of lumbar spine   . HYPOTHYROIDISM 02/02/2010  . HLD (hyperlipidemia) 02/02/2010  . Essential hypertension 02/02/2010  . GERD 02/02/2010  . ABDOMINAL AORTIC ANEURYSM REPAIR, HX OF 02/02/2010   PCP:  [name], MD Pharmacy:   [facility] [address] [place] [address] Phone: [phone] Fax: [phone]

## 2020-04-06 NOTE — ED Notes (Signed)
Patient had a delay in medication administration due to receiving dialysis treatment in the ED. Maxipime coming from the main pharmacy.

## 2020-04-06 NOTE — ED Notes (Signed)
Rockwell Alexandria, Dialysis Nurse, is here.

## 2020-04-06 NOTE — ED Notes (Signed)
Patient had low oxygen saturations and became short of breath upon exertion. Patient repositioned in bed. Respiratory called to evaluate patient.

## 2020-04-06 NOTE — Discharge Summary (Signed)
Jeremy Johnson, is a 74 y.o. male  DOB [birth year]  MRN [id number].  Admission date:  04/05/2020  Admitting Physician  Roney Jaffe, MD  Discharge Date:  04/06/2020   Primary MD  [name], MD  Recommendations for primary care physician for things to follow:   1)Very low-salt diet advised 2)Weigh yourself daily, call if you gain more than 3 pounds in 1 day or more than 5 pounds in 1 week as your diuretic medications and your hemodialysis dry weight may need to be adjusted 3)Limit your Fluid  intake to no more than 50 ounces (1.5 Liters) per day 4)you need oxygen at home at 2 L via nasal cannula continuously while awake and while asleep---  having open fires around oxygen can cause fire, significant injury and death 5) please continue hemodialysis per usual schedule of Tuesday Thursdays and Saturdays 6) please follow-up with your primary care physician for reevaluation within a week  Admission Diagnosis  Acute and chronic respiratory failure with hypoxia ([place]) [J96.21]   Discharge Diagnosis  Acute and chronic respiratory failure with hypoxia ([place]) [J96.21]    Principal Problem:   Acute respiratory failure with hypoxia ([place]) Active Problems:   ESRD (end stage renal disease) on dialysis ([place])   Chronic obstructive pulmonary disease/emphysema   Essential hypertension   Small cell lung cancer ([place])   Hypothyroidism   Anemia in chronic kidney disease      Past Medical History:  Diagnosis Date  . Anemia   . Blood transfusion without reported diagnosis   . CAD (coronary artery disease)    STENT... MID CIRCUMFLEX...1997  . Chronic kidney disease    STAGE 3  . COPD (chronic obstructive pulmonary disease) ([place])   . Degenerative joint disease (DJD) of lumbar spine   . GERD (gastroesophageal reflux disease)   . Gout   . Hyperlipidemia   . Hypertension   . Hypothyroidism   . Incisional hernia     abdomen  . Leukocytosis    CHRONIC MILD  . Myocardial infarction ([place])    1997  . SCL CA dx'd 01/2019   Lung cancer    Past Surgical History:  Procedure Laterality Date  . ABDOMINAL AORTIC ANEURYSM REPAIR  2006  . AV FISTULA PLACEMENT Left 04/25/2019   Procedure: ARTERIOVENOUS (AV) FISTULA CREATION LEFT ARM;  Surgeon: Angelia Mould, MD;  Location: [facility];  Service: Vascular;  Laterality: Left;  . AV FISTULA PLACEMENT Left 05/19/2019   Procedure: CONVERSION OF LEFT ARM ARTERIOVENOUS FISTULA TO GRAFT;  Surgeon: Angelia Mould, MD;  Location: [facility];  Service: Vascular;  Laterality: Left;  . BIOPSY  09/30/2018   Procedure: BIOPSY;  Surgeon: Danie Binder, MD;  [facility];  Service: Endoscopy;;  ascending colon  . COLONOSCOPY  2008  . COLONOSCOPY N/A 09/30/2018   Procedure: COLONOSCOPY;  Surgeon: Danie Binder, MD;  [facility];  Service: Endoscopy;  Laterality: N/A;  9:00  . [place]   MID  CIRCUMFLEX  . IR FLUORO GUIDE CV LINE RIGHT  04/22/2019  . IR US GUIDE VASC ACCESS RIGHT  04/22/2019  . POLYPECTOMY  09/30/2018   Procedure: POLYPECTOMY;  Surgeon: Danie Binder, MD;  [facility];  Service: Endoscopy;;  colon  . VIDEO BRONCHOSCOPY WITH ENDOBRONCHIAL NAVIGATION N/A 01/27/2019   Procedure: VIDEO BRONCHOSCOPY WITH ENDOBRONCHIAL NAVIGATION;  Surgeon: Grace Isaac, MD;  Location: [facility];  Service: Thoracic;  Laterality: N/A;  . VIDEO BRONCHOSCOPY WITH ENDOBRONCHIAL ULTRASOUND N/A 01/27/2019   Procedure: VIDEO BRONCHOSCOPY WITH ENDOBRONCHIAL ULTRASOUND;  Surgeon: Grace Isaac, MD;  Location: [facility];  Service: Thoracic;  Laterality: N/A;     HPI  from the history and physical done on the day of admission:   Chief Complaint: SOB HPI: The patient is a 74 y.o. year-old w/ hx of SCCa of lung (off chemo now, last chemoRx fall 2020), ESRD on HD x 1 yr [facility] TTS, HTN, HL, gout, COPD,  pulm fibrosis, CAD sp stent comes to ED w/ SOB.  No fever, chills no prod cough, + dry cough, slight leg edema. May be losing wt, he is not sure.  SOB x 1-2 days, worse today. No CP.  Last HD Sat 2 days ago, next due tomorrow.  Asked to see for admit.    [name] and provided most of hx.  Pt failed HFNC so is now on bipap and wob is starting to settle down.      Hospital Course:     Brief Summary 74 y.o. male  who is a reformed smoker with history of lung cancer, emphysema/ COPD with pulmonary fibrosis, CAD status post stent, ESRD on HD TTS at [facility] presented with worsening shortness of breath and new onset hypoxia and was admitted on 04/05/2020 for acute hypoxic respiratory failure secondary to presumed CHF in an ESRD patient   A/p 1)Acute hypoxic respiratory failure--- multifactorial, patient is a reformed smoker with history of emphysema/COPD, pulmonary fibrosis and lung cancer, also appears to have congestive heart failure with volume overload -Patient was very hypoxic on presentation initially required BiPAP and oxygen at 6 to 7 L/min continuously - -Patient required serial/back-to-back hemodialysis on 04/05/2020 and 04/06/20--- hypoxia improved but did not resolve -Patient will need home O2 at 2 L/min CTA Chest without PE or acute pneumonia  2)ESRD--- continue hemodialysis on Tuesdays Thursdays and Saturdays, next HD session is 04/08/2020  3)Anemia of ESRD--- patient on iron and Mircera as outpatient  4)HTN--double, continue amlodipine, clonidine and metoprolol  5)COPD/Pulm Fibrosis---??  Mild exacerbation, prednisone, Advair and albuterol as prescribed  6)CAD--patient with prior angioplasty and stent, no ACS type symptoms, remains chest pain-free--- continue aspirin, metoprolol and Crestor  7)Hypothyroidism--, continue levothyroxine  NB!!! -Inpatient criteria met -On admission patient was very very symptomatic, very dyspneic and very hypoxic with  O2 sats of 73% on oxygen requiring BiPAP--- with serial back-to-back hemodialysis sessions on 04/05/2020 on 04/06/2020 respiratory status and hypoxia improved but hypoxia did not resolve -Overall patient improved above and beyond what would have been expected given his initial presentation -We will discharge home on home O2  Discharge Condition: Stable  Follow UP--nephrologist and PCP   Consults obtained - Nephrology  Diet and Activity recommendation:  As advised  Discharge Instructions    Discharge Instructions    Call MD for:  difficulty breathing, headache or visual disturbances   Complete by: As directed    Call MD for:  extreme fatigue  Complete by: As directed    Call MD for:  persistant dizziness or light-headedness   Complete by: As directed    Call MD for:  persistant nausea and vomiting   Complete by: As directed    Call MD for:  severe uncontrolled pain   Complete by: As directed    Call MD for:  temperature >100.4   Complete by: As directed    Diet - low sodium heart healthy   Complete by: As directed    Discharge instructions   Complete by: As directed    1)Very low-salt diet advised 2)Weigh yourself daily, call if you gain more than 3 pounds in 1 day or more than 5 pounds in 1 week as your diuretic medications and your hemodialysis dry weight may need to be adjusted 3)Limit your Fluid  intake to no more than 50 ounces (1.5 Liters) per day 4)you need oxygen at home at 2 L via nasal cannula continuously while awake and while asleep---  having open fires around oxygen can cause fire, significant injury and death 5) please continue hemodialysis per usual schedule of Tuesday Thursdays and Saturdays 6) please follow-up with your primary care physician for reevaluation within a week   Increase activity slowly   Complete by: As directed        Discharge Medications     Allergies as of 04/06/2020      Reactions   Penicillins Rash, Other (See Comments)   Has patient  had a PCN reaction causing immediate rash, facial/tongue/throat swelling, SOB or lightheadedness with hypotension: No Has patient had a PCN reaction causing severe rash involving mucus membranes or skin necrosis: No Has patient had a PCN reaction that required hospitalization: No Has patient had a PCN reaction occurring within the last 10 years: No If all of the above answers are "NO", then may proceed with Cephalosporin use.      Medication List    TAKE these medications   acetaminophen 325 MG tablet Commonly known as: TYLENOL Take 2 tablets (650 mg total) by mouth every 6 (six) hours as needed for mild pain (or Fever >/= 101).   albuterol 108 (90 Base) MCG/ACT inhaler Commonly known as: VENTOLIN HFA Inhale 1-2 puffs into the lungs every 6 (six) hours as needed for wheezing or shortness of breath.   allopurinol 100 MG tablet Commonly known as: ZYLOPRIM Take 1 tablet (100 mg total) by mouth daily. What changed: when to take this   amLODipine 10 MG tablet Commonly known as: NORVASC Take 10 mg by mouth at bedtime.   aspirin EC 81 MG tablet Take 81 mg by mouth at bedtime.   cloNIDine 0.1 MG tablet Commonly known as: CATAPRES Take 0.1 mg by mouth 2 (two) times daily.   Darbepoetin Alfa 100 MCG/0.5ML Sosy injection Commonly known as: ARANESP Inject 0.5 mLs (100 mcg total) into the vein every Thursday with hemodialysis.   folic acid-vitamin b complex-vitamin c-selenium-zinc 3 MG Tabs tablet Take 1 tablet by mouth in the morning.   levocetirizine 5 MG tablet Commonly known as: XYZAL Take 5 mg by mouth every evening.   levothyroxine 175 MCG tablet Commonly known as: SYNTHROID Take 175 mcg by mouth daily before breakfast.   lidocaine-prilocaine cream Commonly known as: EMLA Apply 1 application topically Every Tuesday,Thursday,and Saturday with dialysis.   metoprolol tartrate 50 MG tablet Commonly known as: LOPRESSOR Take 25 mg by mouth 2 (two) times daily.   MIRCERA  IJ Inject 30 mg into the vein every  28 (twenty-eight) days.   Mucinex 600 MG 12 hr tablet Generic drug: guaiFENesin Take 1 tablet (600 mg total) by mouth 2 (two) times daily for 10 days.   omeprazole 40 MG capsule Commonly known as: PRILOSEC Take 40 mg by mouth in the morning.   polyethylene glycol 17 g packet Commonly known as: MIRALAX / GLYCOLAX Take 17 g by mouth daily. What changed:   when to take this  reasons to take this   pramipexole 0.25 MG tablet Commonly known as: MIRAPEX Take 0.25 mg by mouth at bedtime.   predniSONE 20 MG tablet Commonly known as: Deltasone Take 2 tablets (40 mg total) by mouth daily with breakfast.   rosuvastatin 10 MG tablet Commonly known as: CRESTOR Take 10 mg by mouth at bedtime.            Durable Medical Equipment  (From admission, onward)         Start     Ordered   04/06/20 1504  For home use only DME oxygen  Once    Comments: SATURATION QUALIFICATIONS: (Thisnote is usedto comply with regulatory documentation for home oxygen)  Patient Saturations on Room Air at Rest =87 %  Patient Saturations on Room Air while Ambulating =84 %  Patient Saturations on2Liters of oxygen while Ambulating = 93 %   Patient needs continuous O2 at 2 L/min continuously via nasal cannula with humidifier, with gaseous portability and conserving device  Question Answer Comment  Length of Need Lifetime   Mode or (Route) Nasal cannula   Liters per Minute 2   Frequency Continuous (stationary and portable oxygen unit needed)   Oxygen conserving device Yes   Oxygen delivery system Gas      04/06/20 1506          Major procedures and Radiology Reports - PLEASE review detailed and final reports for all details, in brief -   DG Chest 2 View  Result Date: 04/05/2020 CLINICAL DATA:  Shortness of breath EXAM: CHEST - 2 VIEW COMPARISON:  Chest CT January 16, 2020; chest radiograph May 22, 2019 FINDINGS: There is fibrosis throughout the  lungs bilaterally with patchy airspace opacity in the left upper lobe an each lung base. Apparent radiation therapy change left perihilar region. Heart is upper normal in size with pulmonary vascularity normal. No adenopathy. There is aortic atherosclerosis. Aneurysmal dilatation in the aortic arch region is better seen on recent CT. No adenopathy. No bone lesions. IMPRESSION: Underlying fibrosis. Radiation therapy fibrosis noted in the left perihilar region. Patchy airspace opacity in the right base laterally, concerning for focal pneumonia. Airspace opacity in the left upper lobe and left base are similar to March 2021 study and may represent combination of scarring and residual infiltrate. Concern for a degree of multifocal pneumonia with superimposed scarring and fibrosis. Stable cardiac silhouette. No adenopathy appreciable. Aortic Atherosclerosis (ICD10-I70.0). Electronically Signed   By: Lowella Grip III M.D.   On: 04/05/2020 13:06   CT Angio Chest PE W and/or Wo Contrast  Result Date: 04/05/2020 CLINICAL DATA:  Intermittent shortness of breath for 1 month, worsening over last several days, orthopnea, hypoxia, history of non-small cell lung cancer status post chemotherapy and radiation therapy EXAM: CT ANGIOGRAPHY CHEST WITH CONTRAST TECHNIQUE: Multidetector CT imaging of the chest was performed using the standard protocol during bolus administration of intravenous contrast. Multiplanar CT image reconstructions and MIPs were obtained to evaluate the vascular anatomy. CONTRAST:  17m OMNIPAQUE IOHEXOL 350 MG/ML SOLN COMPARISON:  01/16/2020, 12/05/2019 FINDINGS:  Cardiovascular: This is a technically adequate evaluation of the pulmonary vasculature. No filling defects or pulmonary emboli. The heart is enlarged, with prominent left ventricular dilatation. No pericardial effusion. Focal aneurysmal dilatation of the proximal aortic arch again noted, measuring up to 4.6 cm in greatest diameter, stable. No  evidence of dissection. There is diffuse atherosclerosis of the aorta and coronary vessels. Mediastinum/Nodes: No enlarged mediastinal, hilar, or axillary lymph nodes. Thyroid gland, trachea, and esophagus demonstrate no significant findings. Lungs/Pleura: Upper lobe predominant scarring fibrosis is noted. Likely post radiation change within the left upper lobe, stable. There is superimposed interlobular septal thickening and bilateral airspace disease, along with bilateral pleural effusions. Findings are consistent with congestive heart failure. No pneumothorax. There are areas of compressive atelectasis within the bilateral lower lobes. The subcentimeter pulmonary nodule seen previously are not well visualized on this exam due to underlying consolidation. Upper Abdomen: No acute abnormality. Musculoskeletal: No acute displaced fractures. Reconstructed images demonstrate no additional findings. Review of the MIP images confirms the above findings. IMPRESSION: 1. No evidence of pulmonary embolus. 2. Findings consistent with congestive heart failure superimposed upon background scarring and fibrosis. 3. Stable 4.6 cm aneurysmal dilatation of the proximal aortic arch. No evidence of dissection. 4. Post therapeutic changes left upper lobe likely related to prior radiation therapy for lung cancer. Stable appearance since prior studies. 5. Aortic Atherosclerosis (ICD10-I70.0) and Emphysema (ICD10-J43.9). Electronically Signed   By: Randa Ngo M.D.   On: 04/05/2020 17:09   Portable chest 1 View  Result Date: 04/06/2020 CLINICAL DATA:  Follow-up pulmonary infiltrate EXAM: PORTABLE CHEST 1 VIEW COMPARISON:  Radiograph 04/05/2020, CT 04/05/2020 FINDINGS: There are some increasing coalescent opacities predominantly through the right mid to lower lung with enlarging bilateral pleural effusions from the comparison radiograph in keeping with the large effusion seen on CT images. There is a background of chronic reticular  scarring and severe emphysematous changes better assessed on cross-sectional imaging. Focal aneurysmal dilatation of the thoracic aortic arch is again noted. Remaining cardiomediastinal contours are similar to prior with cardiomegaly. No pneumothorax. Telemetry leads overlie the chest. IMPRESSION: 1. Increasing coalescent and interstitial opacities predominantly through the right mid to lower lung likely reflecting worsening pulmonary edema with enlarging bilateral pleural effusions and passive atelectasis when compared to prior radiograph. 2. Focal aneurysmal dilatation of the thoracic aortic arch, similar to comparison. 3. Background of chronic architectural distortion, fibrosis and severe emphysema Electronically Signed   By: Lovena Le M.D.   On: 04/06/2020 05:29    Micro Results    Recent Results (from the past 240 hour(s))  SARS Coronavirus 2 by RT PCR (hospital order, performed in [facility] hospital lab) Nasopharyngeal Nasopharyngeal Swab     Status: None   Collection Time: 04/05/20  2:42 PM   Specimen: Nasopharyngeal Swab  Result Value Ref Range Status   SARS Coronavirus 2 NEGATIVE NEGATIVE Final    Comment: (NOTE) SARS-CoV-2 target nucleic acids are NOT DETECTED. The SARS-CoV-2 RNA is generally detectable in upper and lower respiratory specimens during the acute phase of infection. The lowest concentration of SARS-CoV-2 viral copies this assay can detect is 250 copies / mL. A negative result does not preclude SARS-CoV-2 infection and should not be used as the sole basis for treatment or other patient management decisions.  A negative result may occur with improper specimen collection / handling, submission of specimen other than nasopharyngeal swab, presence of viral mutation(s) within the areas targeted by this assay, and inadequate number of viral copies (<  250 copies / mL). A negative result must be combined with clinical observations, patient history, and epidemiological  information. Fact Sheet for Patients:   StrictlyIdeas.no Fact Sheet for Healthcare Providers: BankingDealers.co.za This test is not yet approved or cleared  by the [place] FDA and has been authorized for detection and/or diagnosis of SARS-CoV-2 by FDA under an Emergency Use Authorization (EUA).  This EUA will remain in effect (meaning this test can be used) for the duration of the COVID-19 declaration under Section 564(b)(1) of the Act, 21 U.S.C. section 360bbb-3(b)(1), unless the authorization is terminated or revoked sooner. Performed at [facility], [address], [place], [place] [address]        Today   Subjective    Jeremy Johnson today has no new complaints   -wife at bedside -Respiratory status has improved significantly          Patient has been seen and examined prior to discharge   Objective   Blood pressure 120/71, pulse 79, temperature 98.1 F (36.7 C), temperature source Oral, resp. rate (!) 24, height '5\' 9"'  (1.753 m), weight 76.2 kg, SpO2 90 %.   Intake/Output Summary (Last 24 hours) at 04/06/2020 1527 Last data filed at 04/06/2020 1515 Gross per 24 hour  Intake 495 ml  Output 4800 ml  Net -4305 ml   Exam Gen:- Awake Alert, no acute distress  HEENT:- [place].AT, No sclera icterus Nose- [place] 2L.min Neck-Supple Neck,No JVD,.  Lungs-improving air movement, no wheezing  CV- S1, S2 normal, regular Abd-  +ve B.Sounds, Abd Soft, No tenderness,    Extremity/Skin:- No  edema,   good pulses Psych-affect is appropriate, oriented x3 Neuro-no new focal deficits, no tremors  MSK-left upper extremity AV fistula with positive bruit and thrill   Data Review   CBC w Diff:  Lab Results  Component Value Date   WBC 8.3 04/06/2020   HGB 11.9 (L) 04/06/2020   HGB 11.1 (L) 01/16/2020   HCT 36.4 (L) 04/06/2020   HCT 17.5 (LL) 05/23/2019   PLT 167 04/06/2020   PLT 151 01/16/2020   LYMPHOPCT 14 04/05/2020   BANDSPCT  PENDING 05/23/2019   MONOPCT 9 04/05/2020   EOSPCT 1 04/05/2020   BASOPCT 1 04/05/2020    CMP:  Lab Results  Component Value Date   NA 134 (L) 04/06/2020   K 4.8 04/06/2020   CL 97 (L) 04/06/2020   CO2 23 04/06/2020   BUN 21 04/06/2020   CREATININE 5.72 (H) 04/06/2020   CREATININE 6.69 (HH) 01/16/2020   PROT 7.6 04/06/2020   ALBUMIN 3.5 04/06/2020   BILITOT 1.2 04/06/2020   BILITOT 0.5 01/16/2020   ALKPHOS 86 04/06/2020   AST 32 04/06/2020   AST 19 01/16/2020   ALT 25 04/06/2020   ALT 9 01/16/2020  .   Total Discharge time is about 33 minutes  Roxan Hockey M.D on 04/06/2020 at 3:27 PM  Go to [email or web address] -  for contact info  [facility] - Office  [phone]

## 2020-04-06 NOTE — Progress Notes (Addendum)
  SATURATION QUALIFICATIONS: (Thisnote is usedto comply with regulatory documentation for home oxygen)  Patient Saturations on Room Air at Rest =87 %  Patient Saturations on Room Air while Ambulating =84 %  Patient Saturations on2Liters of oxygen while Ambulating = 93 %  Diagnosis ---COPD and Lung Cancer   Patient needs continuous O2 at 2 L/min continuously via nasal cannula with humidifier, with gaseous portability and conserving device    Roxan Hockey, MD

## 2020-04-06 NOTE — Discharge Instructions (Signed)
1)Very low-salt diet advised 2)Weigh yourself daily, call if you gain more than 3 pounds in 1 day or more than 5 pounds in 1 week as your diuretic medications and your hemodialysis dry weight may need to be adjusted 3)Limit your Fluid  intake to no more than 50 ounces (1.5 Liters) per day 4)you need oxygen at home at 2 L via nasal cannula continuously while awake and while asleep---  having open fires around oxygen can cause fire, significant injury and death 5) please continue hemodialysis per usual schedule of Tuesday Thursdays and Saturdays 6) please follow-up with your primary care physician for reevaluation within a week

## 2020-04-06 NOTE — Consult Note (Addendum)
[facility] Nephrology consult note Reason for Consult: To manage dialysis and dialysis related needs Referring Physician: Dr Denton Brick, C  HPI:  Jeremy Johnson is an 74 y.o. male with history of lung cancer, emphysema, COPD with pulmonary fibrosis, CAD status post stent, ESRD on HD TTS at [facility] presented with worsening shortness of breath and hypoxia, seen as a consultation to manage ESRD. Patient had last dialysis treatment on Saturday which was uneventful.  He has some shortness of breath which was worsened yesterday.  When he checked oxygen saturation it was around 70s.  He called his PCP who directed him to come to ER.  In the emergency he is oxygen saturation level was 73% required high flow oxygen.  The chest x-ray showed COPD and acute pulmonary edema.  He received dialysis last night at ultrafiltration of 2.4 L.  He tolerated well.  This morning his shortness of breath is much better but is still requiring around 2 to 3 L of oxygen. He denies fever, chills, chest pain, nausea, vomiting, headache or dizziness.  His wife is at the bedside in ER.  He has a left upper extremity graft for the access.  The CT scan ruled out PE. He is currently receiving empiric antibiotics vancomycin and cefepime for possible pneumonia.  OP HD orders: Dialyzes TTS at [facility] at [facility] EDW 76.5 kg, 4 hours HD Bath 3K, 2.5 ca, Dialyzer 180 NR .  Access: left UE AVF mircera 30 mcg on 5/20 venofer 50 mg weekly   Past Medical History:  Diagnosis Date  . Anemia   . Blood transfusion without reported diagnosis   . CAD (coronary artery disease)    STENT... MID CIRCUMFLEX...1997  . Chronic kidney disease    STAGE 3  . COPD (chronic obstructive pulmonary disease) ([place])   . Degenerative joint disease (DJD) of lumbar spine   . GERD (gastroesophageal reflux disease)   . Gout   . Hyperlipidemia   . Hypertension   . Hypothyroidism   . Incisional hernia    abdomen   . Leukocytosis    CHRONIC MILD  . Myocardial infarction ([place])    1997  . SCL CA dx'd 01/2019   Lung cancer    Past Surgical History:  Procedure Laterality Date  . ABDOMINAL AORTIC ANEURYSM REPAIR  2006  . AV FISTULA PLACEMENT Left 04/25/2019   Procedure: ARTERIOVENOUS (AV) FISTULA CREATION LEFT ARM;  Surgeon: Angelia Mould, MD;  Location: [facility];  Service: Vascular;  Laterality: Left;  . AV FISTULA PLACEMENT Left 05/19/2019   Procedure: CONVERSION OF LEFT ARM ARTERIOVENOUS FISTULA TO GRAFT;  Surgeon: Angelia Mould, MD;  Location: [place];  Service: Vascular;  Laterality: Left;  . BIOPSY  09/30/2018   Procedure: BIOPSY;  Surgeon: Danie Binder, MD;  [facility];  Service: Endoscopy;;  ascending colon  . COLONOSCOPY  2008  . COLONOSCOPY N/A 09/30/2018   Procedure: COLONOSCOPY;  Surgeon: Danie Binder, MD;  [facility];  Service: Endoscopy;  Laterality: N/A;  9:00  . CORONARY ANGIOPLASTY WITH STENT [place]  . IR FLUORO GUIDE CV LINE RIGHT  04/22/2019  . IR US GUIDE VASC ACCESS RIGHT  04/22/2019  . POLYPECTOMY  09/30/2018   Procedure: POLYPECTOMY;  Surgeon: Danie Binder, MD;  [facility];  Service: Endoscopy;;  colon  . VIDEO BRONCHOSCOPY WITH ENDOBRONCHIAL NAVIGATION N/A 01/27/2019   Procedure: VIDEO BRONCHOSCOPY WITH ENDOBRONCHIAL NAVIGATION;  Surgeon: Grace Isaac, MD;  Location: [facility] OR;  Service: Thoracic;  Laterality: N/A;  . VIDEO BRONCHOSCOPY WITH ENDOBRONCHIAL ULTRASOUND N/A 01/27/2019   Procedure: VIDEO BRONCHOSCOPY WITH ENDOBRONCHIAL ULTRASOUND;  Surgeon: Grace Isaac, MD;  Location: [facility] OR;  Service: Thoracic;  Laterality: N/A;    Family History  Problem Relation Age of Onset  . Stroke Brother   . Lung cancer Sister 16       lung cancer/former    Social History:  reports that he quit smoking about 2 years ago. He has a 27.00 pack-year smoking history. He has never used smokeless tobacco.  He reports current alcohol use. He reports that he does not use drugs.  Allergies:  Allergies  Allergen Reactions  . Penicillins Rash and Other (See Comments)    Has patient had a PCN reaction causing immediate rash, facial/tongue/throat swelling, SOB or lightheadedness with hypotension: No Has patient had a PCN reaction causing severe rash involving mucus membranes or skin necrosis: No Has patient had a PCN reaction that required hospitalization: No Has patient had a PCN reaction occurring within the last 10 years: No If all of the above answers are "NO", then may proceed with Cephalosporin use.     Medications: I have reviewed the patient's current medications.   Results for orders placed or performed during the hospital encounter of 04/05/20 (from the past 48 hour(s))  Basic metabolic panel     Status: Abnormal   Collection Time: 04/05/20 12:47 PM  Result Value Ref Range   Sodium 134 (L) 135 - 145 mmol/L   Potassium 4.1 3.5 - 5.1 mmol/L   Chloride 94 (L) 98 - 111 mmol/L   CO2 26 22 - 32 mmol/L   Glucose, Bld 141 (H) 70 - 99 mg/dL    Comment: Glucose reference range applies only to samples taken after fasting for at least 8 hours.   BUN 34 (H) 8 - 23 mg/dL   Creatinine, Ser 8.63 (H) 0.61 - 1.24 mg/dL   Calcium 8.9 8.9 - 10.3 mg/dL   GFR calc non Af Amer 5 (L) >60 mL/min   GFR calc Af Amer 6 (L) >60 mL/min   Anion gap 14 5 - 15    Comment: Performed at [facility], [address], [place], [place] [address]  CBC with Differential     Status: Abnormal   Collection Time: 04/05/20 12:47 PM  Result Value Ref Range   WBC 6.6 4.0 - 10.5 K/uL   RBC 3.56 (L) 4.22 - 5.81 MIL/uL   Hemoglobin 11.4 (L) 13.0 - 17.0 g/dL   HCT 34.7 (L) 39.0 - 52.0 %   MCV 97.5 80.0 - 100.0 fL   MCH 32.0 26.0 - 34.0 pg   MCHC 32.9 30.0 - 36.0 g/dL   RDW 15.9 (H) 11.5 - 15.5 %   Platelets 181 150 - 400 K/uL   nRBC 0.0 0.0 - 0.2 %   Neutrophils Relative % 75 %   Neutro Abs 4.9 1.7 - 7.7 K/uL    Lymphocytes Relative 14 %   Lymphs Abs 1.0 0.7 - 4.0 K/uL   Monocytes Relative 9 %   Monocytes Absolute 0.6 0.1 - 1.0 K/uL   Eosinophils Relative 1 %   Eosinophils Absolute 0.0 0.0 - 0.5 K/uL   Basophils Relative 1 %   Basophils Absolute 0.0 0.0 - 0.1 K/uL   Immature Granulocytes 0 %   Abs Immature Granulocytes 0.02 0.00 - 0.07 K/uL    Comment: Performed at  [address] [place], [place] [address]  POC SARS Coronavirus 2 Ag-ED - Nasal Swab (BD Veritor Kit)     Status: None   Collection Time: 04/05/20  1:12 PM  Result Value Ref Range   SARS Coronavirus 2 Ag NEGATIVE NEGATIVE    Comment: (NOTE) SARS-CoV-2 antigen NOT DETECTED.  Negative results are presumptive.  Negative results do not preclude SARS-CoV-2 infection and should not be used as the sole basis for treatment or other patient management decisions, including infection  control decisions, particularly in the presence of clinical signs and  symptoms consistent with COVID-19, or in those who have been in contact with the virus.  Negative results must be combined with clinical observations, patient history, and epidemiological information. The expected result is Negative. Fact Sheet for Patients: PodPark.tn Fact Sheet for Healthcare Providers: GiftContent.is This test is not yet approved or cleared by the [place] FDA and  has been authorized for detection and/or diagnosis of SARS-CoV-2 by FDA under an Emergency Use Authorization (EUA).  This EUA will remain in effect (meaning this test can be used) for the duration of  the COVID-19 de claration under Section 564(b)(1) of the Act, 21 U.S.C. section 360bbb-3(b)(1), unless the authorization is terminated or revoked sooner.   SARS Coronavirus 2 by RT PCR (hospital order, performed in [facility] hospital lab) Nasopharyngeal Nasopharyngeal Swab     Status: None   Collection Time: 04/05/20  2:42 PM    Specimen: Nasopharyngeal Swab  Result Value Ref Range   SARS Coronavirus 2 NEGATIVE NEGATIVE    Comment: (NOTE) SARS-CoV-2 target nucleic acids are NOT DETECTED. The SARS-CoV-2 RNA is generally detectable in upper and lower respiratory specimens during the acute phase of infection. The lowest concentration of SARS-CoV-2 viral copies this assay can detect is 250 copies / mL. A negative result does not preclude SARS-CoV-2 infection and should not be used as the sole basis for treatment or other patient management decisions.  A negative result may occur with improper specimen collection / handling, submission of specimen other than nasopharyngeal swab, presence of viral mutation(s) within the areas targeted by this assay, and inadequate number of viral copies (<250 copies / mL). A negative result must be combined with clinical observations, patient history, and epidemiological information. Fact Sheet for Patients:   StrictlyIdeas.no Fact Sheet for Healthcare Providers: BankingDealers.co.za This test is not yet approved or cleared  by the [place] FDA and has been authorized for detection and/or diagnosis of SARS-CoV-2 by FDA under an Emergency Use Authorization (EUA).  This EUA will remain in effect (meaning this test can be used) for the duration of the COVID-19 declaration under Section 564(b)(1) of the Act, 21 U.S.C. section 360bbb-3(b)(1), unless the authorization is terminated or revoked sooner. Performed at [facility], [address], [place], [place] [address]   Comprehensive metabolic panel     Status: Abnormal   Collection Time: 04/06/20  4:20 AM  Result Value Ref Range   Sodium 134 (L) 135 - 145 mmol/L   Potassium 4.8 3.5 - 5.1 mmol/L   Chloride 97 (L) 98 - 111 mmol/L   CO2 23 22 - 32 mmol/L   Glucose, Bld 119 (H) 70 - 99 mg/dL    Comment: Glucose reference range applies only to samples taken after fasting for at least 8  hours.   BUN 21 8 - 23 mg/dL   Creatinine, Ser 5.72 (H) 0.61 - 1.24 mg/dL    Comment: DELTA CHECK NOTED  Calcium 8.8 (L) 8.9 - 10.3 mg/dL   Total Protein 7.6 6.5 - 8.1 g/dL   Albumin 3.5 3.5 - 5.0 g/dL   AST 32 15 - 41 U/L   ALT 25 0 - 44 U/L   Alkaline Phosphatase 86 38 - 126 U/L   Total Bilirubin 1.2 0.3 - 1.2 mg/dL   GFR calc non Af Amer 9 (L) >60 mL/min   GFR calc Af Amer 10 (L) >60 mL/min   Anion gap 14 5 - 15    Comment: Performed at [facility], [address] [place], [place] [address]  CBC     Status: Abnormal   Collection Time: 04/06/20  4:20 AM  Result Value Ref Range   WBC 8.3 4.0 - 10.5 K/uL   RBC 3.67 (L) 4.22 - 5.81 MIL/uL   Hemoglobin 11.9 (L) 13.0 - 17.0 g/dL   HCT 36.4 (L) 39.0 - 52.0 %   MCV 99.2 80.0 - 100.0 fL   MCH 32.4 26.0 - 34.0 pg   MCHC 32.7 30.0 - 36.0 g/dL   RDW 15.9 (H) 11.5 - 15.5 %   Platelets 167 150 - 400 K/uL   nRBC 0.0 0.0 - 0.2 %    Comment: Performed at [facility], [address], [place], [place] [address]    DG Chest 2 View  Result Date: 04/05/2020 CLINICAL DATA:  Shortness of breath EXAM: CHEST - 2 VIEW COMPARISON:  Chest CT January 16, 2020; chest radiograph May 22, 2019 FINDINGS: There is fibrosis throughout the lungs bilaterally with patchy airspace opacity in the left upper lobe an each lung base. Apparent radiation therapy change left perihilar region. Heart is upper normal in size with pulmonary vascularity normal. No adenopathy. There is aortic atherosclerosis. Aneurysmal dilatation in the aortic arch region is better seen on recent CT. No adenopathy. No bone lesions. IMPRESSION: Underlying fibrosis. Radiation therapy fibrosis noted in the left perihilar region. Patchy airspace opacity in the right base laterally, concerning for focal pneumonia. Airspace opacity in the left upper lobe and left base are similar to March 2021 study and may represent combination of scarring and residual infiltrate. Concern for a degree of multifocal  pneumonia with superimposed scarring and fibrosis. Stable cardiac silhouette. No adenopathy appreciable. Aortic Atherosclerosis (ICD10-I70.0). Electronically Signed   By: Lowella Grip III M.D.   On: 04/05/2020 13:06   CT Angio Chest PE W and/or Wo Contrast  Result Date: 04/05/2020 CLINICAL DATA:  Intermittent shortness of breath for 1 month, worsening over last several days, orthopnea, hypoxia, history of non-small cell lung cancer status post chemotherapy and radiation therapy EXAM: CT ANGIOGRAPHY CHEST WITH CONTRAST TECHNIQUE: Multidetector CT imaging of the chest was performed using the standard protocol during bolus administration of intravenous contrast. Multiplanar CT image reconstructions and MIPs were obtained to evaluate the vascular anatomy. CONTRAST:  1109m OMNIPAQUE IOHEXOL 350 MG/ML SOLN COMPARISON:  01/16/2020, 12/05/2019 FINDINGS: Cardiovascular: This is a technically adequate evaluation of the pulmonary vasculature. No filling defects or pulmonary emboli. The heart is enlarged, with prominent left ventricular dilatation. No pericardial effusion. Focal aneurysmal dilatation of the proximal aortic arch again noted, measuring up to 4.6 cm in greatest diameter, stable. No evidence of dissection. There is diffuse atherosclerosis of the aorta and coronary vessels. Mediastinum/Nodes: No enlarged mediastinal, hilar, or axillary lymph nodes. Thyroid gland, trachea, and esophagus demonstrate no significant findings. Lungs/Pleura: Upper lobe predominant scarring fibrosis is noted. Likely post radiation change within the left upper lobe, stable. There is superimposed interlobular  septal thickening and bilateral airspace disease, along with bilateral pleural effusions. Findings are consistent with congestive heart failure. No pneumothorax. There are areas of compressive atelectasis within the bilateral lower lobes. The subcentimeter pulmonary nodule seen previously are not well visualized on this exam  due to underlying consolidation. Upper Abdomen: No acute abnormality. Musculoskeletal: No acute displaced fractures. Reconstructed images demonstrate no additional findings. Review of the MIP images confirms the above findings. IMPRESSION: 1. No evidence of pulmonary embolus. 2. Findings consistent with congestive heart failure superimposed upon background scarring and fibrosis. 3. Stable 4.6 cm aneurysmal dilatation of the proximal aortic arch. No evidence of dissection. 4. Post therapeutic changes left upper lobe likely related to prior radiation therapy for lung cancer. Stable appearance since prior studies. 5. Aortic Atherosclerosis (ICD10-I70.0) and Emphysema (ICD10-J43.9). Electronically Signed   By: Randa Ngo M.D.   On: 04/05/2020 17:09   Portable chest 1 View  Result Date: 04/06/2020 CLINICAL DATA:  Follow-up pulmonary infiltrate EXAM: PORTABLE CHEST 1 VIEW COMPARISON:  Radiograph 04/05/2020, CT 04/05/2020 FINDINGS: There are some increasing coalescent opacities predominantly through the right mid to lower lung with enlarging bilateral pleural effusions from the comparison radiograph in keeping with the large effusion seen on CT images. There is a background of chronic reticular scarring and severe emphysematous changes better assessed on cross-sectional imaging. Focal aneurysmal dilatation of the thoracic aortic arch is again noted. Remaining cardiomediastinal contours are similar to prior with cardiomegaly. No pneumothorax. Telemetry leads overlie the chest. IMPRESSION: 1. Increasing coalescent and interstitial opacities predominantly through the right mid to lower lung likely reflecting worsening pulmonary edema with enlarging bilateral pleural effusions and passive atelectasis when compared to prior radiograph. 2. Focal aneurysmal dilatation of the thoracic aortic arch, similar to comparison. 3. Background of chronic architectural distortion, fibrosis and severe emphysema Electronically Signed    By: Lovena Le M.D.   On: 04/06/2020 05:29    ROS: As per H&P.  Rest of the systems reviewed and are negative. Blood pressure (!) 146/77, pulse 78, temperature 97.6 F (36.4 C), temperature source Axillary, resp. rate 16, height '5\' 9"'  (1.753 m), weight 76.2 kg, SpO2 100 %. General: Sitting on chair comfortable, using 2 L of oxygen with oxygen saturation around 93%. Neck: No JVP distention, no neck mass Cardiovascular: Regular rate rhythm, S1-S2 normal, no rub Respiratory: Bibasal reduced breath sound, no wheezing appreciated GI: Abdomen soft, nontender.  Bowel sounds positive Extremities: No lower extreme edema, no cyanosis Skin: No rash, ulcer or wound Neurology: Alert awake and oriented x3.  No focal neurological exam Vascular: Left upper extremity AV graft has good thrill and bruit. Hematology: No petechiae or rashes   Assessment/Plan: 1 Acute hypoxic respiratory failure/acute pulmonary edema: Concomitant with history of COPD pulmonary fibrosis.  Status post urgent HD last night with 2.4 L UF.  Clinically improving.  Plan for another HD today.  On empiric antibiotics per primary team.  2 ESRD: TTS at [facility].  Had extra treatment last night.  Plan for HD today mainly for ultrafiltration and to resume his TTS schedule.  Hopefully he can go home today after the treatment and receive next treatment on Thursday.  Has AV graft for the access.  3 Hypertension: BP acceptable.  UF during HD.  4. Anemia of ESRD: Hemoglobin at goal.  On IV iron and Mircera as outpatient.  5. Metabolic Bone Disease: Monitor electrolytes and phosphorus level.  Discussed with the primary team and the dialysis nurse.  Meganne Rita Tanna Furry 04/06/2020,  9:24 AM

## 2020-04-06 NOTE — Progress Notes (Signed)
TRH NIGHT SHIFT.  The patient completed dialysis.  The staff reports that furosemide was not given and the nitroglycerin infusion was not started.  These 2 medications have been discontinued.  Continue with IV antibiotics.  The patient states that he feels well.  He is currently tolerating nasal cannula well and speaking in full sentences.  I will change the bed request to telemetry.  Tennis Must, MD

## 2020-04-06 NOTE — Procedures (Signed)
     HEMODIALYSIS TREATMENT NOTE:  3 hour low-heparin HD completed via left upper arm AVG (15g ante/retrograde).  Goal met: 2.5 liters removed without interruption in UF.  All blood was returned and hemostasis was achieved in 15 minutes.  Post weight 72.8kg was reported to outpatient HD center.  Unable to wean off oxygen and home O2 has been ordered.    Rockwell Alexandria, RN

## 2020-04-07 DIAGNOSIS — N186 End stage renal disease: Secondary | ICD-10-CM | POA: Diagnosis not present

## 2020-04-07 DIAGNOSIS — J9601 Acute respiratory failure with hypoxia: Secondary | ICD-10-CM | POA: Diagnosis not present

## 2020-04-07 DIAGNOSIS — J449 Chronic obstructive pulmonary disease, unspecified: Secondary | ICD-10-CM | POA: Diagnosis not present

## 2020-04-08 DIAGNOSIS — D631 Anemia in chronic kidney disease: Secondary | ICD-10-CM | POA: Diagnosis not present

## 2020-04-08 DIAGNOSIS — Z992 Dependence on renal dialysis: Secondary | ICD-10-CM | POA: Diagnosis not present

## 2020-04-08 DIAGNOSIS — N186 End stage renal disease: Secondary | ICD-10-CM | POA: Diagnosis not present

## 2020-04-08 DIAGNOSIS — D689 Coagulation defect, unspecified: Secondary | ICD-10-CM | POA: Diagnosis not present

## 2020-04-08 DIAGNOSIS — N2581 Secondary hyperparathyroidism of renal origin: Secondary | ICD-10-CM | POA: Diagnosis not present

## 2020-04-10 DIAGNOSIS — D631 Anemia in chronic kidney disease: Secondary | ICD-10-CM | POA: Diagnosis not present

## 2020-04-10 DIAGNOSIS — Z992 Dependence on renal dialysis: Secondary | ICD-10-CM | POA: Diagnosis not present

## 2020-04-10 DIAGNOSIS — D689 Coagulation defect, unspecified: Secondary | ICD-10-CM | POA: Diagnosis not present

## 2020-04-10 DIAGNOSIS — N2581 Secondary hyperparathyroidism of renal origin: Secondary | ICD-10-CM | POA: Diagnosis not present

## 2020-04-10 DIAGNOSIS — N186 End stage renal disease: Secondary | ICD-10-CM | POA: Diagnosis not present

## 2020-04-12 DIAGNOSIS — I129 Hypertensive chronic kidney disease with stage 1 through stage 4 chronic kidney disease, or unspecified chronic kidney disease: Secondary | ICD-10-CM | POA: Diagnosis not present

## 2020-04-12 DIAGNOSIS — Z992 Dependence on renal dialysis: Secondary | ICD-10-CM | POA: Diagnosis not present

## 2020-04-12 DIAGNOSIS — N186 End stage renal disease: Secondary | ICD-10-CM | POA: Diagnosis not present

## 2020-04-13 DIAGNOSIS — B370 Candidal stomatitis: Secondary | ICD-10-CM | POA: Diagnosis not present

## 2020-04-13 DIAGNOSIS — Z992 Dependence on renal dialysis: Secondary | ICD-10-CM | POA: Diagnosis not present

## 2020-04-13 DIAGNOSIS — N186 End stage renal disease: Secondary | ICD-10-CM | POA: Diagnosis not present

## 2020-04-13 DIAGNOSIS — D509 Iron deficiency anemia, unspecified: Secondary | ICD-10-CM | POA: Diagnosis not present

## 2020-04-13 DIAGNOSIS — J449 Chronic obstructive pulmonary disease, unspecified: Secondary | ICD-10-CM | POA: Diagnosis not present

## 2020-04-13 DIAGNOSIS — D689 Coagulation defect, unspecified: Secondary | ICD-10-CM | POA: Diagnosis not present

## 2020-04-13 DIAGNOSIS — D631 Anemia in chronic kidney disease: Secondary | ICD-10-CM | POA: Diagnosis not present

## 2020-04-13 DIAGNOSIS — N2581 Secondary hyperparathyroidism of renal origin: Secondary | ICD-10-CM | POA: Diagnosis not present

## 2020-04-15 DIAGNOSIS — Z992 Dependence on renal dialysis: Secondary | ICD-10-CM | POA: Diagnosis not present

## 2020-04-15 DIAGNOSIS — N186 End stage renal disease: Secondary | ICD-10-CM | POA: Diagnosis not present

## 2020-04-15 DIAGNOSIS — D689 Coagulation defect, unspecified: Secondary | ICD-10-CM | POA: Diagnosis not present

## 2020-04-15 DIAGNOSIS — D509 Iron deficiency anemia, unspecified: Secondary | ICD-10-CM | POA: Diagnosis not present

## 2020-04-15 DIAGNOSIS — D631 Anemia in chronic kidney disease: Secondary | ICD-10-CM | POA: Diagnosis not present

## 2020-04-15 DIAGNOSIS — N2581 Secondary hyperparathyroidism of renal origin: Secondary | ICD-10-CM | POA: Diagnosis not present

## 2020-04-17 DIAGNOSIS — N186 End stage renal disease: Secondary | ICD-10-CM | POA: Diagnosis not present

## 2020-04-17 DIAGNOSIS — N2581 Secondary hyperparathyroidism of renal origin: Secondary | ICD-10-CM | POA: Diagnosis not present

## 2020-04-17 DIAGNOSIS — D631 Anemia in chronic kidney disease: Secondary | ICD-10-CM | POA: Diagnosis not present

## 2020-04-17 DIAGNOSIS — D689 Coagulation defect, unspecified: Secondary | ICD-10-CM | POA: Diagnosis not present

## 2020-04-17 DIAGNOSIS — D509 Iron deficiency anemia, unspecified: Secondary | ICD-10-CM | POA: Diagnosis not present

## 2020-04-17 DIAGNOSIS — Z992 Dependence on renal dialysis: Secondary | ICD-10-CM | POA: Diagnosis not present

## 2020-04-20 DIAGNOSIS — D631 Anemia in chronic kidney disease: Secondary | ICD-10-CM | POA: Diagnosis not present

## 2020-04-20 DIAGNOSIS — D689 Coagulation defect, unspecified: Secondary | ICD-10-CM | POA: Diagnosis not present

## 2020-04-20 DIAGNOSIS — Z992 Dependence on renal dialysis: Secondary | ICD-10-CM | POA: Diagnosis not present

## 2020-04-20 DIAGNOSIS — D509 Iron deficiency anemia, unspecified: Secondary | ICD-10-CM | POA: Diagnosis not present

## 2020-04-20 DIAGNOSIS — N2581 Secondary hyperparathyroidism of renal origin: Secondary | ICD-10-CM | POA: Diagnosis not present

## 2020-04-20 DIAGNOSIS — N186 End stage renal disease: Secondary | ICD-10-CM | POA: Diagnosis not present

## 2020-04-22 DIAGNOSIS — D509 Iron deficiency anemia, unspecified: Secondary | ICD-10-CM | POA: Diagnosis not present

## 2020-04-22 DIAGNOSIS — N2581 Secondary hyperparathyroidism of renal origin: Secondary | ICD-10-CM | POA: Diagnosis not present

## 2020-04-22 DIAGNOSIS — Z992 Dependence on renal dialysis: Secondary | ICD-10-CM | POA: Diagnosis not present

## 2020-04-22 DIAGNOSIS — D689 Coagulation defect, unspecified: Secondary | ICD-10-CM | POA: Diagnosis not present

## 2020-04-22 DIAGNOSIS — N186 End stage renal disease: Secondary | ICD-10-CM | POA: Diagnosis not present

## 2020-04-22 DIAGNOSIS — D631 Anemia in chronic kidney disease: Secondary | ICD-10-CM | POA: Diagnosis not present

## 2020-04-24 DIAGNOSIS — N186 End stage renal disease: Secondary | ICD-10-CM | POA: Diagnosis not present

## 2020-04-24 DIAGNOSIS — D689 Coagulation defect, unspecified: Secondary | ICD-10-CM | POA: Diagnosis not present

## 2020-04-24 DIAGNOSIS — Z992 Dependence on renal dialysis: Secondary | ICD-10-CM | POA: Diagnosis not present

## 2020-04-24 DIAGNOSIS — D509 Iron deficiency anemia, unspecified: Secondary | ICD-10-CM | POA: Diagnosis not present

## 2020-04-24 DIAGNOSIS — D631 Anemia in chronic kidney disease: Secondary | ICD-10-CM | POA: Diagnosis not present

## 2020-04-24 DIAGNOSIS — N2581 Secondary hyperparathyroidism of renal origin: Secondary | ICD-10-CM | POA: Diagnosis not present

## 2020-04-27 DIAGNOSIS — D631 Anemia in chronic kidney disease: Secondary | ICD-10-CM | POA: Diagnosis not present

## 2020-04-27 DIAGNOSIS — D509 Iron deficiency anemia, unspecified: Secondary | ICD-10-CM | POA: Diagnosis not present

## 2020-04-27 DIAGNOSIS — N2581 Secondary hyperparathyroidism of renal origin: Secondary | ICD-10-CM | POA: Diagnosis not present

## 2020-04-27 DIAGNOSIS — Z992 Dependence on renal dialysis: Secondary | ICD-10-CM | POA: Diagnosis not present

## 2020-04-27 DIAGNOSIS — D689 Coagulation defect, unspecified: Secondary | ICD-10-CM | POA: Diagnosis not present

## 2020-04-27 DIAGNOSIS — N186 End stage renal disease: Secondary | ICD-10-CM | POA: Diagnosis not present

## 2020-04-29 ENCOUNTER — Encounter (HOSPITAL_COMMUNITY): Payer: Self-pay | Admitting: Emergency Medicine

## 2020-04-29 ENCOUNTER — Other Ambulatory Visit: Payer: Self-pay

## 2020-04-29 ENCOUNTER — Emergency Department (HOSPITAL_COMMUNITY): Payer: Medicare Other

## 2020-04-29 ENCOUNTER — Inpatient Hospital Stay (HOSPITAL_COMMUNITY)
Admission: AD | Admit: 2020-04-29 | Discharge: 2020-05-04 | DRG: 521 | Disposition: A | Discharge status: Home Health | Payer: Medicare Other | Attending: Internal Medicine | Admitting: Internal Medicine

## 2020-04-29 ENCOUNTER — Inpatient Hospital Stay (HOSPITAL_COMMUNITY): Payer: Medicare Other

## 2020-04-29 DIAGNOSIS — J9621 Acute and chronic respiratory failure with hypoxia: Secondary | ICD-10-CM | POA: Diagnosis not present

## 2020-04-29 DIAGNOSIS — I119 Hypertensive heart disease without heart failure: Secondary | ICD-10-CM | POA: Diagnosis not present

## 2020-04-29 DIAGNOSIS — Z79899 Other long term (current) drug therapy: Secondary | ICD-10-CM

## 2020-04-29 DIAGNOSIS — W19XXXA Unspecified fall, initial encounter: Secondary | ICD-10-CM | POA: Diagnosis not present

## 2020-04-29 DIAGNOSIS — Z20822 Contact with and (suspected) exposure to covid-19: Secondary | ICD-10-CM | POA: Diagnosis present

## 2020-04-29 DIAGNOSIS — Z743 Need for continuous supervision: Secondary | ICD-10-CM | POA: Diagnosis not present

## 2020-04-29 DIAGNOSIS — E038 Other specified hypothyroidism: Secondary | ICD-10-CM

## 2020-04-29 DIAGNOSIS — I252 Old myocardial infarction: Secondary | ICD-10-CM | POA: Diagnosis not present

## 2020-04-29 DIAGNOSIS — Z992 Dependence on renal dialysis: Secondary | ICD-10-CM | POA: Diagnosis not present

## 2020-04-29 DIAGNOSIS — E782 Mixed hyperlipidemia: Secondary | ICD-10-CM

## 2020-04-29 DIAGNOSIS — I10 Essential (primary) hypertension: Secondary | ICD-10-CM | POA: Diagnosis not present

## 2020-04-29 DIAGNOSIS — J9601 Acute respiratory failure with hypoxia: Secondary | ICD-10-CM | POA: Diagnosis present

## 2020-04-29 DIAGNOSIS — E785 Hyperlipidemia, unspecified: Secondary | ICD-10-CM | POA: Diagnosis present

## 2020-04-29 DIAGNOSIS — S72002A Fracture of unspecified part of neck of left femur, initial encounter for closed fracture: Secondary | ICD-10-CM | POA: Diagnosis not present

## 2020-04-29 DIAGNOSIS — Y832 Surgical operation with anastomosis, bypass or graft as the cause of abnormal reaction of the patient, or of later complication, without mention of misadventure at the time of the procedure: Secondary | ICD-10-CM | POA: Diagnosis not present

## 2020-04-29 DIAGNOSIS — G2581 Restless legs syndrome: Secondary | ICD-10-CM | POA: Diagnosis present

## 2020-04-29 DIAGNOSIS — E039 Hypothyroidism, unspecified: Secondary | ICD-10-CM | POA: Diagnosis not present

## 2020-04-29 DIAGNOSIS — Z955 Presence of coronary angioplasty implant and graft: Secondary | ICD-10-CM

## 2020-04-29 DIAGNOSIS — S72001G Fracture of unspecified part of neck of right femur, subsequent encounter for closed fracture with delayed healing: Secondary | ICD-10-CM | POA: Diagnosis not present

## 2020-04-29 DIAGNOSIS — Y92009 Unspecified place in unspecified non-institutional (private) residence as the place of occurrence of the external cause: Secondary | ICD-10-CM

## 2020-04-29 DIAGNOSIS — T82868A Thrombosis of vascular prosthetic devices, implants and grafts, initial encounter: Secondary | ICD-10-CM | POA: Diagnosis not present

## 2020-04-29 DIAGNOSIS — Z923 Personal history of irradiation: Secondary | ICD-10-CM

## 2020-04-29 DIAGNOSIS — I447 Left bundle-branch block, unspecified: Secondary | ICD-10-CM | POA: Diagnosis present

## 2020-04-29 DIAGNOSIS — K219 Gastro-esophageal reflux disease without esophagitis: Secondary | ICD-10-CM | POA: Diagnosis present

## 2020-04-29 DIAGNOSIS — S72001A Fracture of unspecified part of neck of right femur, initial encounter for closed fracture: Principal | ICD-10-CM | POA: Diagnosis present

## 2020-04-29 DIAGNOSIS — Z79890 Hormone replacement therapy (postmenopausal): Secondary | ICD-10-CM

## 2020-04-29 DIAGNOSIS — Z7982 Long term (current) use of aspirin: Secondary | ICD-10-CM

## 2020-04-29 DIAGNOSIS — Z823 Family history of stroke: Secondary | ICD-10-CM

## 2020-04-29 DIAGNOSIS — D62 Acute posthemorrhagic anemia: Secondary | ICD-10-CM | POA: Diagnosis not present

## 2020-04-29 DIAGNOSIS — J449 Chronic obstructive pulmonary disease, unspecified: Secondary | ICD-10-CM | POA: Diagnosis not present

## 2020-04-29 DIAGNOSIS — R52 Pain, unspecified: Secondary | ICD-10-CM | POA: Diagnosis not present

## 2020-04-29 DIAGNOSIS — M25551 Pain in right hip: Secondary | ICD-10-CM | POA: Diagnosis not present

## 2020-04-29 DIAGNOSIS — Z87891 Personal history of nicotine dependence: Secondary | ICD-10-CM | POA: Diagnosis not present

## 2020-04-29 DIAGNOSIS — Z801 Family history of malignant neoplasm of trachea, bronchus and lung: Secondary | ICD-10-CM

## 2020-04-29 DIAGNOSIS — N250 Renal osteodystrophy: Secondary | ICD-10-CM | POA: Diagnosis not present

## 2020-04-29 DIAGNOSIS — E8770 Fluid overload, unspecified: Secondary | ICD-10-CM | POA: Diagnosis present

## 2020-04-29 DIAGNOSIS — D631 Anemia in chronic kidney disease: Secondary | ICD-10-CM | POA: Diagnosis present

## 2020-04-29 DIAGNOSIS — N186 End stage renal disease: Secondary | ICD-10-CM | POA: Diagnosis not present

## 2020-04-29 DIAGNOSIS — C3490 Malignant neoplasm of unspecified part of unspecified bronchus or lung: Secondary | ICD-10-CM

## 2020-04-29 DIAGNOSIS — Z8679 Personal history of other diseases of the circulatory system: Secondary | ICD-10-CM

## 2020-04-29 DIAGNOSIS — Z7952 Long term (current) use of systemic steroids: Secondary | ICD-10-CM

## 2020-04-29 DIAGNOSIS — Z4901 Encounter for fitting and adjustment of extracorporeal dialysis catheter: Secondary | ICD-10-CM | POA: Diagnosis not present

## 2020-04-29 DIAGNOSIS — I120 Hypertensive chronic kidney disease with stage 5 chronic kidney disease or end stage renal disease: Secondary | ICD-10-CM | POA: Diagnosis present

## 2020-04-29 DIAGNOSIS — W1830XA Fall on same level, unspecified, initial encounter: Secondary | ICD-10-CM | POA: Diagnosis present

## 2020-04-29 DIAGNOSIS — Z85118 Personal history of other malignant neoplasm of bronchus and lung: Secondary | ICD-10-CM

## 2020-04-29 DIAGNOSIS — I2510 Atherosclerotic heart disease of native coronary artery without angina pectoris: Secondary | ICD-10-CM | POA: Diagnosis not present

## 2020-04-29 DIAGNOSIS — Z888 Allergy status to other drugs, medicaments and biological substances status: Secondary | ICD-10-CM

## 2020-04-29 DIAGNOSIS — E876 Hypokalemia: Secondary | ICD-10-CM | POA: Diagnosis not present

## 2020-04-29 DIAGNOSIS — Z880 Allergy status to penicillin: Secondary | ICD-10-CM

## 2020-04-29 DIAGNOSIS — I517 Cardiomegaly: Secondary | ICD-10-CM | POA: Diagnosis not present

## 2020-04-29 DIAGNOSIS — N189 Chronic kidney disease, unspecified: Secondary | ICD-10-CM | POA: Diagnosis present

## 2020-04-29 DIAGNOSIS — J8410 Pulmonary fibrosis, unspecified: Secondary | ICD-10-CM | POA: Diagnosis present

## 2020-04-29 DIAGNOSIS — E8889 Other specified metabolic disorders: Secondary | ICD-10-CM | POA: Diagnosis present

## 2020-04-29 LAB — BASIC METABOLIC PANEL
Anion gap: 14 (ref 5–15)
BUN: 31 mg/dL — ABNORMAL HIGH (ref 8–23)
CO2: 27 mmol/L (ref 22–32)
Calcium: 9 mg/dL (ref 8.9–10.3)
Chloride: 94 mmol/L — ABNORMAL LOW (ref 98–111)
Creatinine, Ser: 8.36 mg/dL — ABNORMAL HIGH (ref 0.61–1.24)
GFR calc Af Amer: 7 mL/min — ABNORMAL LOW (ref >60)
GFR calc non Af Amer: 6 mL/min — ABNORMAL LOW (ref >60)
Glucose, Bld: 135 mg/dL — ABNORMAL HIGH (ref 70–99)
Potassium: 4.6 mmol/L (ref 3.5–5.1)
Sodium: 135 mmol/L (ref 135–145)

## 2020-04-29 LAB — CBC WITH DIFFERENTIAL/PLATELET
Abs Immature Granulocytes: 0.05 10*3/uL (ref 0.00–0.07)
Basophils Absolute: 0.1 10*3/uL (ref 0.0–0.1)
Basophils Relative: 1 %
Eosinophils Absolute: 0.1 10*3/uL (ref 0.0–0.5)
Eosinophils Relative: 2 %
HCT: 31 % — ABNORMAL LOW (ref 39.0–52.0)
Hemoglobin: 10.3 g/dL — ABNORMAL LOW (ref 13.0–17.0)
Immature Granulocytes: 1 %
Lymphocytes Relative: 18 %
Lymphs Abs: 1.4 10*3/uL (ref 0.7–4.0)
MCH: 33.2 pg (ref 26.0–34.0)
MCHC: 33.2 g/dL (ref 30.0–36.0)
MCV: 100 fL (ref 80.0–100.0)
Monocytes Absolute: 0.6 10*3/uL (ref 0.1–1.0)
Monocytes Relative: 7 %
Neutro Abs: 5.7 10*3/uL (ref 1.7–7.7)
Neutrophils Relative %: 71 %
Platelets: 183 10*3/uL (ref 150–400)
RBC: 3.1 MIL/uL — ABNORMAL LOW (ref 4.22–5.81)
RDW: 16 % — ABNORMAL HIGH (ref 11.5–15.5)
WBC: 7.9 10*3/uL (ref 4.0–10.5)
nRBC: 0 % (ref 0.0–0.2)

## 2020-04-29 LAB — PROTIME-INR
INR: 1.1 (ref 0.8–1.2)
Prothrombin Time: 13.5 seconds (ref 11.4–15.2)

## 2020-04-29 LAB — TYPE AND SCREEN
ABO/RH(D): O POS
Antibody Screen: NEGATIVE

## 2020-04-29 LAB — SARS CORONAVIRUS 2 BY RT PCR (HOSPITAL ORDER, PERFORMED IN ~~LOC~~ HOSPITAL LAB): SARS Coronavirus 2: NEGATIVE

## 2020-04-29 LAB — MRSA PCR SCREENING: MRSA by PCR: NEGATIVE

## 2020-04-29 MED ORDER — HEPARIN SODIUM (PORCINE) 5000 UNIT/ML IJ SOLN
5000.0000 [IU] | Freq: Three times a day (TID) | INTRAMUSCULAR | Status: DC
  Administered 2020-04-29 (×3): 5000 [IU] via SUBCUTANEOUS
  Filled 2020-04-29 (×3): qty 1

## 2020-04-29 MED ORDER — LIDOCAINE-PRILOCAINE 2.5-2.5 % EX CREA
1.0000 "application " | TOPICAL_CREAM | CUTANEOUS | Status: DC | PRN
  Filled 2020-04-29: qty 5

## 2020-04-29 MED ORDER — LEVOTHYROXINE SODIUM 75 MCG PO TABS
175.0000 ug | ORAL_TABLET | Freq: Every day | ORAL | Status: DC
  Administered 2020-04-30 – 2020-05-04 (×5): 175 ug via ORAL
  Filled 2020-04-29 (×5): qty 1

## 2020-04-29 MED ORDER — CEFAZOLIN SODIUM-DEXTROSE 2-4 GM/100ML-% IV SOLN
2.0000 g | INTRAVENOUS | Status: AC
  Administered 2020-04-30: 2 g via INTRAVENOUS
  Filled 2020-04-29: qty 100

## 2020-04-29 MED ORDER — POLYETHYLENE GLYCOL 3350 17 G PO PACK: 17.0000 g | PACK | Freq: Every day | ORAL | Status: DC | PRN

## 2020-04-29 MED ORDER — ALLOPURINOL 100 MG PO TABS
100.0000 mg | ORAL_TABLET | Freq: Every morning | ORAL | Status: DC
  Administered 2020-05-01 – 2020-05-04 (×4): 100 mg via ORAL
  Filled 2020-04-29 (×4): qty 1

## 2020-04-29 MED ORDER — ROSUVASTATIN CALCIUM 5 MG PO TABS
10.0000 mg | ORAL_TABLET | Freq: Every day | ORAL | Status: DC
  Administered 2020-04-29 – 2020-05-03 (×5): 10 mg via ORAL
  Filled 2020-04-29 (×5): qty 2

## 2020-04-29 MED ORDER — LIDOCAINE HCL (PF) 1 % IJ SOLN: 5.0000 mL | INTRAMUSCULAR | Status: DC | PRN

## 2020-04-29 MED ORDER — CHLORHEXIDINE GLUCONATE 4 % EX LIQD
60.0000 mL | Freq: Once | CUTANEOUS | Status: AC
  Administered 2020-04-30: 4 via TOPICAL
  Filled 2020-04-29: qty 60

## 2020-04-29 MED ORDER — RENA-VITE PO TABS
1.0000 | ORAL_TABLET | Freq: Every morning | ORAL | Status: DC
  Administered 2020-05-01 – 2020-05-04 (×4): 1 via ORAL
  Filled 2020-04-29 (×4): qty 1

## 2020-04-29 MED ORDER — ENSURE ENLIVE PO LIQD: 237.0000 mL | Freq: Two times a day (BID) | ORAL | Status: DC

## 2020-04-29 MED ORDER — SODIUM CHLORIDE 0.9 % IV SOLN: 100.0000 mL | INTRAVENOUS | Status: DC | PRN

## 2020-04-29 MED ORDER — CLONIDINE HCL 0.1 MG PO TABS
0.1000 mg | ORAL_TABLET | Freq: Two times a day (BID) | ORAL | Status: DC
  Administered 2020-04-29 – 2020-05-04 (×8): 0.1 mg via ORAL
  Filled 2020-04-29 (×9): qty 1

## 2020-04-29 MED ORDER — HYDROMORPHONE HCL 1 MG/ML IJ SOLN
0.5000 mg | INTRAMUSCULAR | Status: DC | PRN
  Administered 2020-04-29 (×4): 0.5 mg via INTRAVENOUS
  Filled 2020-04-29 (×4): qty 1

## 2020-04-29 MED ORDER — METOPROLOL TARTRATE 25 MG PO TABS
25.0000 mg | ORAL_TABLET | Freq: Two times a day (BID) | ORAL | Status: DC
  Administered 2020-04-29 – 2020-05-04 (×9): 25 mg via ORAL
  Filled 2020-04-29 (×9): qty 1

## 2020-04-29 MED ORDER — MOMETASONE FURO-FORMOTEROL FUM 200-5 MCG/ACT IN AERO
2.0000 | INHALATION_SPRAY | Freq: Two times a day (BID) | RESPIRATORY_TRACT | Status: DC
  Filled 2020-04-29: qty 8.8

## 2020-04-29 MED ORDER — PENTAFLUOROPROP-TETRAFLUOROETH EX AERO: 1.0000 "application " | INHALATION_SPRAY | CUTANEOUS | Status: DC | PRN

## 2020-04-29 MED ORDER — AMLODIPINE BESYLATE 10 MG PO TABS
10.0000 mg | ORAL_TABLET | Freq: Every day | ORAL | Status: DC
  Administered 2020-04-29 – 2020-05-03 (×5): 10 mg via ORAL
  Filled 2020-04-29 (×5): qty 1

## 2020-04-29 MED ORDER — ENSURE PRE-SURGERY PO LIQD
296.0000 mL | Freq: Once | ORAL | Status: AC
  Administered 2020-04-29: 296 mL via ORAL
  Filled 2020-04-29: qty 296

## 2020-04-29 MED ORDER — ONDANSETRON HCL 4 MG/2ML IJ SOLN: 4.0000 mg | Freq: Four times a day (QID) | INTRAMUSCULAR | Status: DC | PRN

## 2020-04-29 MED ORDER — HYDROCODONE-ACETAMINOPHEN 5-325 MG PO TABS
1.0000 | ORAL_TABLET | Freq: Four times a day (QID) | ORAL | Status: DC | PRN
  Administered 2020-04-29 (×2): 1 via ORAL
  Administered 2020-04-30: 2 via ORAL
  Administered 2020-05-04: 1 via ORAL
  Filled 2020-04-29: qty 2
  Filled 2020-04-29: qty 1
  Filled 2020-04-29: qty 2
  Filled 2020-04-29: qty 1

## 2020-04-29 MED ORDER — PANTOPRAZOLE SODIUM 40 MG PO TBEC
80.0000 mg | DELAYED_RELEASE_TABLET | Freq: Every day | ORAL | Status: DC
  Administered 2020-05-01 – 2020-05-04 (×4): 80 mg via ORAL
  Filled 2020-04-29 (×4): qty 2

## 2020-04-29 MED ORDER — ONDANSETRON HCL 4 MG/2ML IJ SOLN
INTRAMUSCULAR | Status: AC
  Administered 2020-04-29: 4 mg via INTRAVENOUS
  Filled 2020-04-29: qty 2

## 2020-04-29 MED ORDER — CETIRIZINE HCL 10 MG PO TABS
5.0000 mg | ORAL_TABLET | Freq: Every evening | ORAL | Status: DC
  Administered 2020-05-02: 5 mg via ORAL
  Filled 2020-04-29 (×6): qty 1

## 2020-04-29 MED ORDER — CHLORHEXIDINE GLUCONATE CLOTH 2 % EX PADS
6.0000 | MEDICATED_PAD | Freq: Every day | CUTANEOUS | Status: DC
  Administered 2020-04-30 – 2020-05-01 (×2): 6 via TOPICAL

## 2020-04-29 MED ORDER — MORPHINE SULFATE (PF) 4 MG/ML IV SOLN
4.0000 mg | INTRAVENOUS | Status: DC | PRN
  Administered 2020-04-30: 4 mg via INTRAVENOUS
  Filled 2020-04-29: qty 1

## 2020-04-29 MED ORDER — POVIDONE-IODINE 10 % EX SWAB: 2.0000 "application " | Freq: Once | CUTANEOUS | Status: DC

## 2020-04-29 MED ORDER — PRAMIPEXOLE DIHYDROCHLORIDE 0.25 MG PO TABS
0.2500 mg | ORAL_TABLET | Freq: Every day | ORAL | Status: DC
  Administered 2020-04-29 – 2020-05-03 (×5): 0.25 mg via ORAL
  Filled 2020-04-29 (×6): qty 1

## 2020-04-29 NOTE — Progress Notes (Addendum)
Patient ID: Jeremy Johnson, male   DOB: [birth year], 74 y.o.   MRN: [id number] Emergency room alerted made to patient with a hip fracture  MEDICAL HISTORY:     Past Medical History:  Diagnosis Date  . Anemia   . Blood transfusion without reported diagnosis   . CAD (coronary artery disease)    STENT... MID CIRCUMFLEX...1997  . Chronic kidney disease    STAGE 3  . COPD (chronic obstructive pulmonary disease) ([place])   . Degenerative joint disease (DJD) of lumbar spine   . GERD (gastroesophageal reflux disease)   . Gout   . Hyperlipidemia   . Hypertension   . Hypothyroidism   . Incisional hernia    abdomen  . Leukocytosis    CHRONIC MILD  . Myocardial infarction ([place])    1997  . SCL CA dx'd 01/2019   Lung cancer   Recommend patient be transferred.  Patient has a history of coronary artery disease, has a stent in place, had a recent small lung cancer, has kidney disease requiring dialysis.  Patient is due for dialysis today.  Surgery would have to be done no earlier than Friday approaching the weekend where are staffing and support staff is limited.

## 2020-04-29 NOTE — Procedures (Signed)
     URGENT HEMODIALYSIS TREATMENT NOTE:   Pt was dialyzed in [facility] emergency department prior to transfer to [facility] for further treatment of right hip fracture.    Pre-treatment pt was hemodynamically stable, saturating 94% on 2L O2.  Heparin-free tx was initiated via left upper arm AVG (15g ante/retrograde) on 3K bath.  Per Dr. Luis Abed verbal order, dialysate was changed to 2K/2.5Ca 30 minutes into session.  Initial goal of 2.4 L was an attempt to achieve outpatient EDW of 72kg, however UF was interrupted x 15 minutes for c/o leg cramps and UFR was then lowered.    Treatment was discontinued 30 minutes early per Dr. Luis Abed order to attend to another emergent case in the ED.  All blood was returned and hemostasis was achieved in 15 minutes.  Total run time: 3.5 hours Net UF: 2118cc  Rockwell Alexandria, RN

## 2020-04-29 NOTE — ED Notes (Signed)
Pt currently getting dialysis

## 2020-04-29 NOTE — H&P (Addendum)
History and Physical  Jeremy Johnson CNO:[id number] DOB: 14-Apr-1946 DOA: 04/29/2020   PCP: [name], MD   Patient coming from: Home  Chief Complaint: right hip pain  HPI:  Jeremy Johnson is a 74 y.o. male with medical history of ESRD (T-T-S) at [facility], coronary artery disease, COPD, hypertension, hyperlipidemia, small cell lung cancer, hypothyroidism presenting after mechanical fall while getting dressed to go to dialysis in the early a.m. 04/29/2020.  The patient denies any syncope.  The patient had been in his usual state of health until his mechanical fall.  He had denied any fevers, chills, headache, chest pain, shortness breath, coughing, hemoptysis, nausea, vomiting, diarrhea, abdominal pain, hematochezia, melena, dizziness.  The patient has a remote history of MI back in 1997 but has not seen a cardiologist for over 10 years after he was released by Dr. Lia Foyer for being medically stable.  The patient denies any history of stroke.  He is compliant with all his medications.  He was last dialyzed on 04/27/2020 and had a full dialysis session. In the emergency department, the patient was afebrile hemodynamically stable with oxygen saturation 97% on room air.  WBC 7.9, hemoglobin 10.3, platelets 183,000.  Potassium 4.6, sodium 135, serum creatinine 8.36.  Nephrology and orthopedics were consulted to assist with management.  Assessment/Plan: Right femoral neck fracture -Orthopedics, Dr. Percell Miller consulted-will see patient after transfer -planning for surgery in am 04/30/20 -Preoperative echocardiogram  ESRD -Dialyzes Tuesday, Thursday, Saturday -He appears clinically euvolemic -Nephrology has been consulted and plans for dialysis 04/29/2020 prior to transfer to [facility]  Coronary artery disease -No chest pain presently -Personally reviewed EKG--> sinus rhythm, IVCD unchanged, nonspecific ST-T wave changes -Holding aspirin in anticipation for surgery  Non-small cell lung  cancer -Patient follows Dr. Earlie Server -Status post 4 cycles carboplatin and etoposide -Currently under observation -Last CT did not show any recurrence  Essential hypertension -Continue metoprolol tartrate, amlodipine and clonidine  COPD/pulmonary fibrosis -Stable on room air -Continue Advair home dose  Hypothyroidism -Continue Synthroid  Hyperlipidemia -Continue Crestor  Restless leg syndrome -Continue pramipexole  GERD -Continue PPI        Past Medical History:  Diagnosis Date  . Anemia   . Blood transfusion without reported diagnosis   . CAD (coronary artery disease)    STENT... MID CIRCUMFLEX...1997  . Chronic kidney disease    STAGE 3  . COPD (chronic obstructive pulmonary disease) ([place])   . Degenerative joint disease (DJD) of lumbar spine   . GERD (gastroesophageal reflux disease)   . Gout   . Hyperlipidemia   . Hypertension   . Hypothyroidism   . Incisional hernia    abdomen  . Leukocytosis    CHRONIC MILD  . Myocardial infarction ([place])    1997  . SCL CA dx'd 01/2019   Lung cancer   Past Surgical History:  Procedure Laterality Date  . ABDOMINAL AORTIC ANEURYSM REPAIR  2006  . AV FISTULA PLACEMENT Left 04/25/2019   Procedure: ARTERIOVENOUS (AV) FISTULA CREATION LEFT ARM;  Surgeon: Angelia Mould, MD;  Location: [place];  Service: Vascular;  Laterality: Left;  . AV FISTULA PLACEMENT Left 05/19/2019   Procedure: CONVERSION OF LEFT ARM ARTERIOVENOUS FISTULA TO GRAFT;  Surgeon: Angelia Mould, MD;  Location: [place];  Service: Vascular;  Laterality: Left;  . BIOPSY  09/30/2018   Procedure: BIOPSY;  Surgeon: Danie Binder, MD;  [facility];  Service: Endoscopy;;  ascending colon  . COLONOSCOPY  2008  . COLONOSCOPY N/A 09/30/2018   Procedure: COLONOSCOPY;  Surgeon: Danie Binder, MD;  [facility];  Service: Endoscopy;  Laterality: N/A;  9:00  . CORONARY ANGIOPLASTY WITH STENT [place]  . IR  FLUORO GUIDE CV LINE RIGHT  04/22/2019  . IR US GUIDE VASC ACCESS RIGHT  04/22/2019  . POLYPECTOMY  09/30/2018   Procedure: POLYPECTOMY;  Surgeon: Danie Binder, MD;  [facility];  Service: Endoscopy;;  colon  . VIDEO BRONCHOSCOPY WITH ENDOBRONCHIAL NAVIGATION N/A 01/27/2019   Procedure: VIDEO BRONCHOSCOPY WITH ENDOBRONCHIAL NAVIGATION;  Surgeon: Grace Isaac, MD;  Location: [place];  Service: Thoracic;  Laterality: N/A;  . VIDEO BRONCHOSCOPY WITH ENDOBRONCHIAL ULTRASOUND N/A 01/27/2019   Procedure: VIDEO BRONCHOSCOPY WITH ENDOBRONCHIAL ULTRASOUND;  Surgeon: Grace Isaac, MD;  Location: [place];  Service: Thoracic;  Laterality: N/A;   Social History:  reports that he quit smoking about 2 years ago. He has a 27.00 pack-year smoking history. He has never used smokeless tobacco. He reports current alcohol use. He reports that he does not use drugs.   Family History  Problem Relation Age of Onset  . Stroke Brother   . Lung cancer Sister 51       lung cancer/former     Allergies  Allergen Reactions  . Penicillins Rash and Other (See Comments)    Has patient had a PCN reaction causing immediate rash, facial/tongue/throat swelling, SOB or lightheadedness with hypotension: No Has patient had a PCN reaction causing severe rash involving mucus membranes or skin necrosis: No Has patient had a PCN reaction that required hospitalization: No Has patient had a PCN reaction occurring within the last 10 years: No If all of the above answers are "NO", then may proceed with Cephalosporin use.      Prior to Admission medications   Medication Sig Start Date End Date Taking? Authorizing Provider  acetaminophen (TYLENOL) 325 MG tablet Take 2 tablets (650 mg total) by mouth every 6 (six) hours as needed for mild pain (or Fever >/= 101). 05/25/19   Swayze, Ava, DO  albuterol (VENTOLIN HFA) 108 (90 Base) MCG/ACT inhaler Inhale 1-2 puffs into the lungs every 6 (six) hours as needed for  wheezing or shortness of breath.  02/02/20   [provider]  allopurinol (ZYLOPRIM) 100 MG tablet Take 1 tablet (100 mg total) by mouth daily. Patient taking differently: Take 100 mg by mouth in the morning.  04/26/19   Mercy Riding, MD  amLODipine (NORVASC) 10 MG tablet Take 10 mg by mouth at bedtime.  08/16/19   [provider]  aspirin EC 81 MG tablet Take 81 mg by mouth at bedtime.    [provider]  cloNIDine (CATAPRES) 0.1 MG tablet Take 0.1 mg by mouth 2 (two) times daily.    [provider]  Darbepoetin Alfa (ARANESP) 100 MCG/0.5ML SOSY injection Inject 0.5 mLs (100 mcg total) into the vein every Thursday with hemodialysis. 05/01/19   Mercy Riding, MD  Fluticasone-Salmeterol (ADVAIR DISKUS) 250-50 MCG/DOSE AEPB Inhale 1 puff into the lungs 2 (two) times daily. 04/06/20 04/06/21  Roxan Hockey, MD  folic acid-vitamin b complex-vitamin c-selenium-zinc (DIALYVITE) 3 MG TABS tablet Take 1 tablet by mouth in the morning.    [provider]  levocetirizine (XYZAL) 5 MG tablet Take 5 mg by mouth every evening.  09/04/19   [provider]  levothyroxine (SYNTHROID, LEVOTHROID) 175 MCG tablet Take 175 mcg  by mouth daily before breakfast.     [provider]  lidocaine-prilocaine (EMLA) cream Apply 1 application topically Every Tuesday,Thursday,and Saturday with dialysis.  06/24/19   [provider]  Methoxy PEG-Epoetin Beta (MIRCERA IJ) Inject 30 mg into the vein every 28 (twenty-eight) days.  03/23/20 03/22/21  [provider]  metoprolol tartrate (LOPRESSOR) 50 MG tablet Take 25 mg by mouth 2 (two) times daily.     [provider]  omeprazole (PRILOSEC) 40 MG capsule Take 40 mg by mouth in the morning.  08/15/19   [provider]  polyethylene glycol (MIRALAX / GLYCOLAX) 17 g packet Take 17 g by mouth daily. Patient taking differently: Take 17 g by mouth daily as needed for mild constipation or moderate  constipation.  05/26/19   Swayze, Ava, DO  pramipexole (MIRAPEX) 0.25 MG tablet Take 0.25 mg by mouth at bedtime. 03/31/20   [provider]  predniSONE (DELTASONE) 20 MG tablet Take 2 tablets (40 mg total) by mouth daily with breakfast. 04/06/20   Denton Brick, Courage, MD  rosuvastatin (CRESTOR) 10 MG tablet Take 10 mg by mouth at bedtime. 01/30/20   [provider]    Review of Systems:  Constitutional:  No weight loss, night sweats, Fevers, chills, fatigue.  Head&Eyes: No headache.  No vision loss.  No eye pain or scotoma ENT:  No Difficulty swallowing,Tooth/dental problems,Sore throat,  No ear ache, post nasal drip,  Cardio-vascular:  No chest pain, Orthopnea, PND, swelling in lower extremities,  dizziness, palpitations  GI:  No  abdominal pain, nausea, vomiting, diarrhea, loss of appetite, hematochezia, melena, heartburn, indigestion, Resp:  No shortness of breath with exertion or at rest. No cough. No coughing up of blood .No wheezing.No chest wall deformity  Skin:  no rash or lesions.  GU:  no dysuria, change in color of urine, no urgency or frequency. No flank pain.  Musculoskeletal:  Right hip pain Psych:  No change in mood or affect. No depression or anxiety. Neurologic: No headache, no dysesthesia, no focal weakness, no vision loss. No syncope  Physical Exam: Vitals:   04/29/20 0641 04/29/20 0730  BP: 135/86 135/75  Pulse: 78 75  Resp: 18 20  Temp: 97.6 F (36.4 C)   TempSrc: Oral   SpO2: (S) (!) 86% 97%   General:  A&O x 3, NAD, nontoxic, pleasant/cooperative Head/Eye: No conjunctival hemorrhage, no icterus, [place]/AT, No nystagmus ENT:  No icterus,  No thrush, good dentition, no pharyngeal exudate Neck:  No masses, no lymphadenpathy, no bruits CV:  RRR, no rub, no gallop, no S3 Lung:  Bibasilar crackles R>L Abdomen: soft/NT, +BS, nondistended, no peritoneal signs Ext: No cyanosis, No rashes, No petechiae, No lymphangitis, No edema Neuro: CNII-XII  intact, strength 4/5 in bilateral upper and lower extremities, no dysmetria  Labs on Admission:  Basic Metabolic Panel: No results for input(s): NA, K, CL, CO2, GLUCOSE, BUN, CREATININE, CALCIUM, MG, PHOS in the last 168 hours. Liver Function Tests: No results for input(s): AST, ALT, ALKPHOS, BILITOT, PROT, ALBUMIN in the last 168 hours. No results for input(s): LIPASE, AMYLASE in the last 168 hours. No results for input(s): AMMONIA in the last 168 hours. CBC: No results for input(s): WBC, NEUTROABS, HGB, HCT, MCV, PLT in the last 168 hours. Coagulation Profile: No results for input(s): INR, PROTIME in the last 168 hours. Cardiac Enzymes: No results for input(s): CKTOTAL, CKMB, CKMBINDEX, TROPONINI in the last 168 hours. BNP: Invalid input(s): POCBNP CBG: No results for input(s): GLUCAP in  the last 168 hours. Urine analysis:    Component Value Date/Time   COLORURINE YELLOW 05/22/2019 1919   APPEARANCEUR CLEAR 05/22/2019 1919   LABSPEC 1.022 05/22/2019 1919   PHURINE 8.0 05/22/2019 1919   GLUCOSEU 150 (A) 05/22/2019 1919   HGBUR MODERATE (A) 05/22/2019 [place] NEGATIVE 05/22/2019 [place] NEGATIVE 05/22/2019 1919   PROTEINUR >=300 (A) 05/22/2019 1919   NITRITE NEGATIVE 05/22/2019 1919   LEUKOCYTESUR NEGATIVE 05/22/2019 1919   Sepsis Labs: @LABRCNTIP (procalcitonin:4,lacticidven:4) )No results found for this or any previous visit (from the past 240 hour(s)).   Radiological Exams on Admission: DG Chest 1 View  Result Date: 04/29/2020 CLINICAL DATA:  Fall with right hip pain EXAM: CHEST  1 VIEW COMPARISON:  04/06/2020 FINDINGS: Mild cardiomegaly. Widened upper mediastinum with known thoracic aortic aneurysm. Infiltrate around the left hilum with volume loss correlating with history of lung cancer and radiotherapy. Bilateral interstitial prominence that is mildly asymmetric to the right. No visible effusion or pneumothorax. IMPRESSION: 1. Interstitial prominence,  possible mild edema. 2. Post treatment left hilum. 3. Known thoracic aortic aneurysm. Electronically Signed   By: Monte Fantasia M.D.   On: 04/29/2020 07:31   DG Hip Unilat With Pelvis 2-3 Views Right  Result Date: 04/29/2020 CLINICAL DATA:  Fall. EXAM: DG HIP (WITH OR WITHOUT PELVIS) 2-3V RIGHT COMPARISON:  03/09/2016 FINDINGS: Right femoral neck fracture without measurable displacement. Located hip. No significant acetabular changes. Osteopenia. IMPRESSION: Acute right femoral neck fracture. Electronically Signed   By: Monte Fantasia M.D.   On: 04/29/2020 07:32    EKG: Independently reviewed. Sinus, LVH, nonspecific STT changes    Time spent:60 minutes Code Status:   FULL Family Communication:  Spouse updated at bedside 6/17 Disposition Plan: expect 2-3 day hospitalization Consults called: renal;  Ortho--[place] DVT Prophylaxis: [place] Heparin   Orson Eva, DO  [facility] Pager [phone]  If 7PM-7AM, please contact night-coverage [email or web address] Password [email or web address] 04/29/2020, 8:01 AM

## 2020-04-29 NOTE — Progress Notes (Signed)
Patient ID: Jeremy Johnson, male   DOB: August 09, 1946, 74 y.o.   MRN: [id number]  Plan hip hemiarthroplasty tomorrow with Dr. Marcelino Scot. Please keep NPO after MN. Full consult note to follow in AM.     Lisette Abu, PA-C Orthopedic Surgery [phone]

## 2020-04-29 NOTE — Progress Notes (Signed)
1455 Received pt from [facility] via [place]. Pt is A&O x4. On O2 at 2Li per nasal cannula. Right leg is shorter than the left leg. Pain noted with movement.

## 2020-04-29 NOTE — Consult Note (Signed)
[facility] Renal Consultation Note  Requesting MD: Orson Eva, MD Indication for Consultation:  ESRD  Chief complaint: fell and broke hip  HPI: Jeremy Johnson is a 74 y.o. male with a history of ESRD on HD TTS, COPD with pulmonary fibrosis, coronary artery disease status post stent, and prior lung cancer who presented to the hospital after a mechanical fall while at home putting on jeans to get ready for HD.  He denies any preceding dizziness.  He was found to have sustained a right femoral neck fracture.  He is for operative repair.  Orthopedics has requested dialysis prior to surgery.  His primary team reports that he is being transferred to [facility] per Ortho request.  Per primary team charting surgery is planned for 6/18 am.  He's not on oxygen at home but has been on 2 liters here - 94 % on my exam. covid test is pending   PMHx:   Past Medical History:  Diagnosis Date  . Anemia   . Blood transfusion without reported diagnosis   . CAD (coronary artery disease)    STENT... MID CIRCUMFLEX...1997  . Chronic kidney disease    STAGE 3  . COPD (chronic obstructive pulmonary disease) ([place])   . Degenerative joint disease (DJD) of lumbar spine   . GERD (gastroesophageal reflux disease)   . Gout   . Hyperlipidemia   . Hypertension   . Hypothyroidism   . Incisional hernia    abdomen  . Leukocytosis    CHRONIC MILD  . Myocardial infarction ([place])    1997  . SCL CA dx'd 01/2019   Lung cancer    Past Surgical History:  Procedure Laterality Date  . ABDOMINAL AORTIC ANEURYSM REPAIR  2006  . AV FISTULA PLACEMENT Left 04/25/2019   Procedure: ARTERIOVENOUS (AV) FISTULA CREATION LEFT ARM;  Surgeon: Angelia Mould, MD;  Location: [place];  Service: Vascular;  Laterality: Left;  . AV FISTULA PLACEMENT Left 05/19/2019   Procedure: CONVERSION OF LEFT ARM ARTERIOVENOUS FISTULA TO GRAFT;  Surgeon: Angelia Mould, MD;  Location: [place];  Service: Vascular;  Laterality: Left;   . BIOPSY  09/30/2018   Procedure: BIOPSY;  Surgeon: Danie Binder, MD;  [facility];  Service: Endoscopy;;  ascending colon  . COLONOSCOPY  2008  . COLONOSCOPY N/A 09/30/2018   Procedure: COLONOSCOPY;  Surgeon: Danie Binder, MD;  [facility];  Service: Endoscopy;  Laterality: N/A;  9:00  . CORONARY ANGIOPLASTY WITH STENT [place]  . IR FLUORO GUIDE CV LINE RIGHT  04/22/2019  . IR US GUIDE VASC ACCESS RIGHT  04/22/2019  . POLYPECTOMY  09/30/2018   Procedure: POLYPECTOMY;  Surgeon: Danie Binder, MD;  [facility];  Service: Endoscopy;;  colon  . VIDEO BRONCHOSCOPY WITH ENDOBRONCHIAL NAVIGATION N/A 01/27/2019   Procedure: VIDEO BRONCHOSCOPY WITH ENDOBRONCHIAL NAVIGATION;  Surgeon: Grace Isaac, MD;  Location: [place];  Service: Thoracic;  Laterality: N/A;  . VIDEO BRONCHOSCOPY WITH ENDOBRONCHIAL ULTRASOUND N/A 01/27/2019   Procedure: VIDEO BRONCHOSCOPY WITH ENDOBRONCHIAL ULTRASOUND;  Surgeon: Grace Isaac, MD;  Location: [facility] OR;  Service: Thoracic;  Laterality: N/A;    Family Hx:  Family History  Problem Relation Age of Onset  . Stroke Brother   . Lung cancer Sister 61       lung cancer/former    Social History:  reports that he quit smoking about 2 years ago. He has a 27.00  pack-year smoking history. He has never used smokeless tobacco. He reports current alcohol use. He reports that he does not use drugs.  Allergies:  Allergies  Allergen Reactions  . Penicillins Rash and Other (See Comments)    Has patient had a PCN reaction causing immediate rash, facial/tongue/throat swelling, SOB or lightheadedness with hypotension: No Has patient had a PCN reaction causing severe rash involving mucus membranes or skin necrosis: No Has patient had a PCN reaction that required hospitalization: No Has patient had a PCN reaction occurring within the last 10 years: No If all of the above answers are "NO", then may proceed with  Cephalosporin use.     Medications: Prior to Admission medications   Medication Sig Start Date End Date Taking? Authorizing Provider  acetaminophen (TYLENOL) 325 MG tablet Take 2 tablets (650 mg total) by mouth every 6 (six) hours as needed for mild pain (or Fever >/= 101). 05/25/19   Swayze, Ava, DO  albuterol (VENTOLIN HFA) 108 (90 Base) MCG/ACT inhaler Inhale 1-2 puffs into the lungs every 6 (six) hours as needed for wheezing or shortness of breath.  02/02/20   [provider]  allopurinol (ZYLOPRIM) 100 MG tablet Take 1 tablet (100 mg total) by mouth daily. Patient taking differently: Take 100 mg by mouth in the morning.  04/26/19   Mercy Riding, MD  amLODipine (NORVASC) 10 MG tablet Take 10 mg by mouth at bedtime.  08/16/19   [provider]  aspirin EC 81 MG tablet Take 81 mg by mouth at bedtime.    [provider]  cloNIDine (CATAPRES) 0.1 MG tablet Take 0.1 mg by mouth 2 (two) times daily.    [provider]  Darbepoetin Alfa (ARANESP) 100 MCG/0.5ML SOSY injection Inject 0.5 mLs (100 mcg total) into the vein every Thursday with hemodialysis. 05/01/19   Mercy Riding, MD  Fluticasone-Salmeterol (ADVAIR DISKUS) 250-50 MCG/DOSE AEPB Inhale 1 puff into the lungs 2 (two) times daily. 04/06/20 04/06/21  Roxan Hockey, MD  folic acid-vitamin b complex-vitamin c-selenium-zinc (DIALYVITE) 3 MG TABS tablet Take 1 tablet by mouth in the morning.    [provider]  levocetirizine (XYZAL) 5 MG tablet Take 5 mg by mouth every evening.  09/04/19   [provider]  levothyroxine (SYNTHROID, LEVOTHROID) 175 MCG tablet Take 175 mcg by mouth daily before breakfast.     [provider]  lidocaine-prilocaine (EMLA) cream Apply 1 application topically Every Tuesday,Thursday,and Saturday with dialysis.  06/24/19   [provider]  Methoxy PEG-Epoetin Beta (MIRCERA IJ) Inject 30 mg into the vein every 28 (twenty-eight) days.  03/23/20 03/22/21   [provider]  metoprolol tartrate (LOPRESSOR) 50 MG tablet Take 25 mg by mouth 2 (two) times daily.     [provider]  omeprazole (PRILOSEC) 40 MG capsule Take 40 mg by mouth in the morning.  08/15/19   [provider]  polyethylene glycol (MIRALAX / GLYCOLAX) 17 g packet Take 17 g by mouth daily. Patient taking differently: Take 17 g by mouth daily as needed for mild constipation or moderate constipation.  05/26/19   Swayze, Ava, DO  pramipexole (MIRAPEX) 0.25 MG tablet Take 0.25 mg by mouth at bedtime. 03/31/20   [provider]  predniSONE (DELTASONE) 20 MG tablet Take 2 tablets (40 mg total) by mouth daily with breakfast. 04/06/20   Denton Brick, Courage, MD  rosuvastatin (CRESTOR) 10 MG tablet Take 10 mg by mouth at bedtime. 01/30/20   [provider]  I have reviewed the patient's current and reported prior to admission medications.  Labs:  BMP Latest Ref Rng & Units 04/29/2020 04/06/2020 04/05/2020  Glucose 70 - 99 mg/dL 135(H) 119(H) 141(H)  BUN 8 - 23 mg/dL 31(H) 21 34(H)  Creatinine 0.61 - 1.24 mg/dL 8.36(H) 5.72(H) 8.63(H)  Sodium 135 - 145 mmol/L 135 134(L) 134(L)  Potassium 3.5 - 5.1 mmol/L 4.6 4.8 4.1  Chloride 98 - 111 mmol/L 94(L) 97(L) 94(L)  CO2 22 - 32 mmol/L 27 23 26   Calcium 8.9 - 10.3 mg/dL 9.0 8.8(L) 8.9    Urinalysis    Component Value Date/Time   COLORURINE YELLOW 05/22/2019 1919   APPEARANCEUR CLEAR 05/22/2019 1919   LABSPEC 1.022 05/22/2019 1919   PHURINE 8.0 05/22/2019 1919   GLUCOSEU 150 (A) 05/22/2019 1919   HGBUR MODERATE (A) 05/22/2019 [place] NEGATIVE 05/22/2019 [place] NEGATIVE 05/22/2019 1919   PROTEINUR >=300 (A) 05/22/2019 1919   NITRITE NEGATIVE 05/22/2019 1919   LEUKOCYTESUR NEGATIVE 05/22/2019 1919     ROS:  Pertinent items noted in HPI and remainder of comprehensive ROS otherwise negative.   Physical Exam: Vitals:   04/29/20 0641 04/29/20 0730  BP: 135/86 135/75  Pulse:  78 75  Resp: 18 20  Temp: 97.6 F (36.4 C)   SpO2: (S) (!) 86% 97%     General: adult male in bed in NAD at rest HEENT: NCAT Eyes: EOMI sclera anicteric Neck: supple trachea midline Heart:S1S2 no rub  Lungs: clear but reduced anteriorly; on 2L oxygen with sat 94% near start of HD Abdomen: soft/ND/NT Extremities: no pitting edema appreciated; no cyanosis  Skin: no rash on extremities exposed Neuro: alert and oriented x3 provides hx and follows commands pysch normal mood and affect Access: LUE AVG in use  Outpatient HD orders: [place] TTS BF 400  DF 600 (auto 1.5) 4 hours EDW 72 kg (just lowered from 72.5 kg - he had been leaving low 72's range below EDW - hadn't been run at this weight yet) 3K/2.5 Ca bath AV graft mircera 50 mcg every 4 weeks - last on 04/20/20  venofer 50 mg weekly Calcitriol 0.75 mcg three times a week with HD   Assessment/Plan:  # End-stage renal disease - On HD TTS at [facility] - Continue HD per TTS schedule while inpatient.  We are dialyzing him at [facility] prior to transfer to [facility] to expedite treatment - 2K bath today to optimize (normally 3K outpatient) - would avoid morphine for pain given ESRD - please select alternative regimen  # Acute Right femoral neck fracture - For operative repair with Ortho on 6/18 per charting - would avoid morphine for pain given ESRD - please select alternative regimen  # Acute hypoxic resp failure - mild and on suppl oxygen - optimize volume status with HD  # Hypertension - acceptable control on current regimen    # Anemia of ESRD - Hb at goal  # Metabolic bone disease - Check phos in AM. Continue binders   Claudia Desanctis 04/29/2020, 10:15 AM

## 2020-04-29 NOTE — Plan of Care (Signed)
  Problem: Education: Goal: Knowledge of General Education information will improve Description: Including pain rating scale, medication(s)/side effects and non-pharmacologic comfort measures Outcome: Progressing   Problem: Health Behavior/Discharge Planning: Goal: Ability to manage health-related needs will improve Outcome: Progressing   Problem: Clinical Measurements: Goal: Ability to maintain clinical measurements within normal limits will improve Outcome: Progressing  Education handout is given to patient

## 2020-04-29 NOTE — ED Provider Notes (Signed)
[facility] Provider Note   CSN: [id number] Arrival date & time: 04/29/20  1751   History Chief Complaint  Patient presents with  . Hip Pain    Jeremy Johnson is a 74 y.o. male.  The history is provided by the patient.  Hip Pain  He has history of hypertension, hyperlipidemia, COPD, end-stage renal disease on hemodialysis and is brought in by ambulance following a fall at home.  He was putting on his pants to get ready to go to dialysis when he fell landing on his right hip.  He is complaining of pain in the right hip.  He denies other injury.  He was unable to see stand following the fall.  EMS was called and had given him a total of 100 mcg of fentanyl with some improvement in pain.  He currently rates his pain at 6/10.  He specifically denies head injury.  He is not taking any systemic anticoagulants or antiplatelet agents.  Past Medical History:  Diagnosis Date  . Anemia   . Blood transfusion without reported diagnosis   . CAD (coronary artery disease)    STENT... MID CIRCUMFLEX...1997  . Chronic kidney disease    STAGE 3  . COPD (chronic obstructive pulmonary disease) ([place])   . Degenerative joint disease (DJD) of lumbar spine   . GERD (gastroesophageal reflux disease)   . Gout   . Hyperlipidemia   . Hypertension   . Hypothyroidism   . Incisional hernia    abdomen  . Leukocytosis    CHRONIC MILD  . Myocardial infarction ([place])    1997  . SCL CA dx'd 01/2019   Lung cancer    Patient Active Problem List   Diagnosis Date Noted  . Acute respiratory failure with hypoxia ([place]) 04/06/2020  . Acute and chronic respiratory failure with hypoxia ([place]) 04/05/2020  . Chronic obstructive pulmonary disease/emphysema   . Anaphylactic reaction due to adverse effect of correct drug or medicament properly administered, initial encounter 07/30/2019  . Hypocalcemia 05/31/2019  . Other disorders of phosphorus metabolism 05/31/2019  . Pancytopenia ([place])  05/22/2019  . ESRD (end stage renal disease) on dialysis ([place]) 05/22/2019  . Unspecified protein-calorie malnutrition ([place]) 05/02/2019  . Coagulation defect, unspecified ([place]) 04/26/2019  . Diarrhea, unspecified 04/26/2019  . Dyspnea, unspecified 04/26/2019  . Hypokalemia 04/26/2019  . Pain, unspecified 04/26/2019  . Pruritus, unspecified 04/26/2019  . Secondary hyperparathyroidism of renal origin ([place]) 04/26/2019  . Encounter for immunization 04/26/2019  . Incisional hernia without obstruction or gangrene 04/25/2019  . Malignant neoplasm of unspecified part of unspecified bronchus or lung ([place]) 04/25/2019  . Other specified degenerative diseases of nervous system ([place]) 04/25/2019  . Anemia in chronic kidney disease 04/25/2019  . Atherosclerotic heart disease of native coronary artery without angina pectoris 04/25/2019  . Gastro-esophageal reflux disease without esophagitis 04/25/2019  . Hypothyroidism, unspecified 04/25/2019  . Acute renal failure superimposed on stage 3 chronic kidney disease ([place]) 04/16/2019  . Anemia in chronic kidney disease (CKD) 04/14/2019  . Small cell lung cancer ([place]) 02/06/2019  . Encounter for antineoplastic chemotherapy 02/06/2019  . Goals of care, counseling/discussion 02/06/2019  . Special screening for malignant neoplasms, colon   . GERD (gastroesophageal reflux disease)   . Hyperlipidemia   . Gout   . Hypothyroidism   . Degenerative joint disease (DJD) of lumbar spine   . HYPOTHYROIDISM 02/02/2010  . HLD (hyperlipidemia) 02/02/2010  . Essential hypertension 02/02/2010  . GERD 02/02/2010  . ABDOMINAL AORTIC ANEURYSM REPAIR,  HX OF 02/02/2010    Past Surgical History:  Procedure Laterality Date  . ABDOMINAL AORTIC ANEURYSM REPAIR  2006  . AV FISTULA PLACEMENT Left 04/25/2019   Procedure: ARTERIOVENOUS (AV) FISTULA CREATION LEFT ARM;  Surgeon: Angelia Mould, MD;  Location: [facility];  Service: Vascular;  Laterality: Left;  . AV FISTULA  PLACEMENT Left 05/19/2019   Procedure: CONVERSION OF LEFT ARM ARTERIOVENOUS FISTULA TO GRAFT;  Surgeon: Angelia Mould, MD;  Location: [facility];  Service: Vascular;  Laterality: Left;  . BIOPSY  09/30/2018   Procedure: BIOPSY;  Surgeon: [place]ie Binder, MD;  [facility];  Service: Endoscopy;;  ascending colon  . COLONOSCOPY  2008  . COLONOSCOPY N/A 09/30/2018   Procedure: COLONOSCOPY;  Surgeon: [facility]ie Binder, MD;  [facility];  Service: Endoscopy;  Laterality: N/A;  9:00  . CORONARY ANGIOPLASTY WITH STENT [place]  . IR FLUORO GUIDE CV LINE RIGHT  04/22/2019  . IR US GUIDE VASC ACCESS RIGHT  04/22/2019  . POLYPECTOMY  09/30/2018   Procedure: POLYPECTOMY;  Surgeon: [facility]ie Binder, MD;  [facility];  Service: Endoscopy;;  colon  . VIDEO BRONCHOSCOPY WITH ENDOBRONCHIAL NAVIGATION N/A 01/27/2019   Procedure: VIDEO BRONCHOSCOPY WITH ENDOBRONCHIAL NAVIGATION;  Surgeon: Grace Isaac, MD;  Location: [facility];  Service: Thoracic;  Laterality: N/A;  . VIDEO BRONCHOSCOPY WITH ENDOBRONCHIAL ULTRASOUND N/A 01/27/2019   Procedure: VIDEO BRONCHOSCOPY WITH ENDOBRONCHIAL ULTRASOUND;  Surgeon: Grace Isaac, MD;  Location[facility];  Service: Thoracic;  Laterality: N/A;       Family History  Problem Relation Age of Onset  . Stroke Brother   . Lung cancer Sister 98       lung cancer/former    Social History   Tobacco Use  . Smoking status: Former Smoker    Packs/day: 0.50    Years: 54.00    Pack years: 27.00    Quit date: 09/17/2017    Years since quitting: 2.6  . Smokeless tobacco: Never Used  Vaping Use  . Vaping Use: Never used  Substance Use Topics  . Alcohol use: Yes    Comment: occasional  . Drug use: No    Home Medications Prior to Admission medications   Medication Sig Start Date End Date Taking? Authorizing Provider  acetaminophen (TYLENOL) 325 MG tablet Take 2 tablets (650 mg total) by mouth every 6 (six) hours as  needed for mild pain (or Fever >/= 101). 05/25/19   Swayze, Ava, DO  albuterol (VENTOLIN HFA) 108 (90 Base) MCG/ACT inhaler Inhale 1-2 puffs into the lungs every 6 (six) hours as needed for wheezing or shortness of breath.  02/02/20   [provider]  allopurinol (ZYLOPRIM) 100 MG tablet Take 1 tablet (100 mg total) by mouth daily. Patient taking differently: Take 100 mg by mouth in the morning.  04/26/19   Mercy Riding, MD  amLODipine (NORVASC) 10 MG tablet Take 10 mg by mouth at bedtime.  08/16/19   [provider]  aspirin EC 81 MG tablet Take 81 mg by mouth at bedtime.    [provider]  cloNIDine (CATAPRES) 0.1 MG tablet Take 0.1 mg by mouth 2 (two) times daily.    [provider]  Darbepoetin Alfa (ARANESP) 100 MCG/0.5ML SOSY injection Inject 0.5 mLs (100 mcg total) into the vein every Thursday with hemodialysis. 05/01/19   Mercy Riding, MD  Fluticasone-Salmeterol (ADVAIR DISKUS) 250-50 MCG/DOSE AEPB Inhale  1 puff into the lungs 2 (two) times daily. 04/06/20 04/06/21  Roxan Hockey, MD  folic acid-vitamin b complex-vitamin c-selenium-zinc (DIALYVITE) 3 MG TABS tablet Take 1 tablet by mouth in the morning.    [provider]  levocetirizine (XYZAL) 5 MG tablet Take 5 mg by mouth every evening.  09/04/19   [provider]  levothyroxine (SYNTHROID, LEVOTHROID) 175 MCG tablet Take 175 mcg by mouth daily before breakfast.     [provider]  lidocaine-prilocaine (EMLA) cream Apply 1 application topically Every Tuesday,Thursday,and Saturday with dialysis.  06/24/19   [provider]  Methoxy PEG-Epoetin Beta (MIRCERA IJ) Inject 30 mg into the vein every 28 (twenty-eight) days.  03/23/20 03/22/21  [provider]  metoprolol tartrate (LOPRESSOR) 50 MG tablet Take 25 mg by mouth 2 (two) times daily.     [provider]  omeprazole (PRILOSEC) 40 MG capsule Take 40 mg by mouth in the morning.  08/15/19   [provider]  polyethylene glycol (MIRALAX / GLYCOLAX) 17 g packet Take 17 g by mouth daily. Patient taking differently: Take 17 g by mouth daily as needed for mild constipation or moderate constipation.  05/26/19   Swayze, Ava, DO  pramipexole (MIRAPEX) 0.25 MG tablet Take 0.25 mg by mouth at bedtime. 03/31/20   [provider]  predniSONE (DELTASONE) 20 MG tablet Take 2 tablets (40 mg total) by mouth daily with breakfast. 04/06/20   [place] Brick, Courage, MD  rosuvastatin (CRESTOR) 10 MG tablet Take 10 mg by mouth at bedtime. 01/30/20   [provider]    Allergies    Penicillins  Review of Systems   Review of Systems  All other systems reviewed and are negative.   Physical Exam Updated Vital Signs BP 135/86 (BP Location: Right Arm)   Pulse 78   Temp 97.6 F (36.4 C) (Oral)   Resp 18   SpO2 (S) (!) 86% Comment: Placed pt on O2@2l  [place]- sats up to 93%  Physical Exam Vitals and nursing note reviewed.   74 year old male, resting comfortably and in no acute distress. Vital signs are normal. Oxygen saturation is 86%, which is hypoxic. Head is normocephalic and atraumatic. PERRLA, EOMI. Oropharynx is clear. Neck is nontender and supple without adenopathy or JVD. Back is nontender and there is no CVA tenderness. Lungs are clear without rales, wheezes, or rhonchi. Chest is nontender. Heart has regular rate and rhythm without murmur. Abdomen is soft, flat, nontender without masses or hepatosplenomegaly and peristalsis is normoactive. Extremities: Right leg is externally rotated but not shortened.  There is pain on any movement of the right hip and there is mild tenderness palpation over the right hip.  Full range of motion in all other joints without pain.  AV fistula present left forearm with thrill present. Skin is warm and dry without rash. Neurologic: Mental status is normal, cranial nerves are intact, there are no motor or sensory deficits.  ED Results / Procedures /  Treatments   Labs (all labs ordered are listed, but only abnormal results are displayed) Labs Reviewed  SARS CORONAVIRUS 2 BY RT PCR [facility] ORDER, [place])  BASIC METABOLIC PANEL  CBC WITH DIFFERENTIAL/PLATELET  PROTIME-INR  TYPE AND SCREEN    EKG EKG Interpretation  Date/Time:  Thursday April 29 2020 06:34:41 EDT Ventricular Rate:  79 PR Interval:    QRS Duration: 130 QT Interval:  426 QTC Calculation: 489 R Axis:   12 Text Interpretation: Sinus  rhythm Left bundle branch block Baseline wander in lead(s) V2 V4 When compared with ECG of 05/22/2019, Left bundle branch block is now present Confirmed by Delora Fuel ([id number]) on 04/29/2020 6:49:11 AM   Radiology DG Chest 1 View  Result Date: 04/29/2020 CLINICAL DATA:  Fall with right hip pain EXAM: CHEST  1 VIEW COMPARISON:  04/06/2020 FINDINGS: Mild cardiomegaly. Widened upper mediastinum with known thoracic aortic aneurysm. Infiltrate around the left hilum with volume loss correlating with history of lung cancer and radiotherapy. Bilateral interstitial prominence that is mildly asymmetric to the right. No visible effusion or pneumothorax. IMPRESSION: 1. Interstitial prominence, possible mild edema. 2. Post treatment left hilum. 3. Known thoracic aortic aneurysm. Electronically Signed   By: Monte Fantasia M.D.   On: 04/29/2020 07:31   DG Hip Unilat With Pelvis 2-3 Views Right  Result Date: 04/29/2020 CLINICAL DATA:  Fall. EXAM: DG HIP (WITH OR WITHOUT PELVIS) 2-3V RIGHT COMPARISON:  03/09/2016 FINDINGS: Right femoral neck fracture without measurable displacement. Located hip. No significant acetabular changes. Osteopenia. IMPRESSION: Acute right femoral neck fracture. Electronically Signed   By: Monte Fantasia M.D.   On: 04/29/2020 07:32    Procedures Procedures  Medications Ordered in ED Medications  morphine 4 MG/ML injection 4 mg (has no administration in time range)    ED Course  I have  reviewed the triage vital signs and the nursing notes.  Pertinent labs & imaging results that were available during my care of the patient were reviewed by me and considered in my medical decision making (see chart for details).  MDM Rules/Calculators/A&P Fall with right hip injury concerning for fracture.  He is given morphine for pain and is sent for hip x-rays.  Oxygen saturation had dropped after fentanyl, and he is started on nasal oxygen.  X-rays confirm right femoral neck fracture with angulation.  Chest x-ray shows evidence of mild fluid overload consistent with patient's known history of end-stage renal disease and need for dialysis today.  Labs are pending.  ECG shows left bundle branch block which is new.  Case is discussed with Dr. Aline Brochure of orthopedics, who agrees to come and evaluate the patient in the ED.  Case also discussed with Dr. Carles Collet of [facility], who agrees to admit the patient.  Final Clinical Impression(s) / ED Diagnoses Final diagnoses:  Closed fracture of neck of right femur, initial encounter ([place])  End-stage renal disease on hemodialysis [facility])    Rx / DC Orders ED Discharge Orders    None       Delora Fuel, MD 79/48/01 [phone]

## 2020-04-29 NOTE — Progress Notes (Signed)
Initial Nutrition Assessment  DOCUMENTATION CODES:   Not applicable  INTERVENTION:  Ensure Enlive po BID, each supplement provides 350 kcal and 20 grams of protein   NUTRITION DIAGNOSIS:   Increased nutrient needs related to chronic illness, hip fracture (ESRD on HD; anticipated surgical managment of right hip fracture) as evidenced by estimated needs.  GOAL:   Patient will meet greater than or equal to 90% of their needs  MONITOR:   Diet advancement, Weight trends, Labs, I & O's  REASON FOR ASSESSMENT:   Consult Hip fracture protocol  ASSESSMENT:  RD working remotely.   74 year old male with past medical history of ESRD on HD, CAD, COPD, HTN, HLD, small cell lung cancer, hypothyroidism presented after mechanical fall while getting dressed to go to dialysis admitted for right femoral neck fracture.  HD: TTS at [facility] Last HD - 6/15, L AVF  Patient currently in ED, unable to contact via phone to obtain nutrition history at this time. Diet advanced to [facility], NPO at midnight in anticipation for possible surgery. Will order Ensure supplement to aid with meeting needs.  Per notes: -CXR shows mild fluid overload c/w history of ESRD, nephrology consulted -ECG shows new left bundle branch block -Orthopedics recommending transfer d/t medical history  EDW: 72 kg (Care Everywhere) Per chart, last weight 72.8 kg on 04/06/20 post dialysis.   Medications reviewed and include: Dialyvite, Zyzal, Catapres, Mirapex Labs: BUN 31 (H), Cr 8.36 (H), Hgb 10.3 (L)  NUTRITION - FOCUSED PHYSICAL EXAM: Unable to complete at this time, RD working remotely.  Diet Order:   Diet Order            Diet NPO time specified  Diet effective midnight           Diet Heart Room service appropriate? Yes; Fluid consistency: Thin  Diet effective now                 EDUCATION NEEDS:   No education needs have been identified at this time  Skin:  Skin Assessment: Reviewed RN  Assessment  Last BM:  unknown  Height:   Ht Readings from Last 1 Encounters:  04/05/20 5\' 9"  (1.753 m)    Weight:   Wt Readings from Last 1 Encounters:  04/06/20 72.8 kg    BMI:  There is no height or weight on file to calculate BMI.  Estimated Nutritional Needs:   Kcal:  0355-9741  Protein:  110-120  Fluid:  1000 ml + [place], RD, LDN Clinical Nutrition After Hours/Weekend Pager # in [place]

## 2020-04-29 NOTE — ED Notes (Signed)
Pt sleeping with equal rise and chest fall

## 2020-04-29 NOTE — ED Notes (Signed)
[facility] arrived at this time.

## 2020-04-29 NOTE — Progress Notes (Signed)
Orthopedic Tech Progress Note Patient Details:  Jeremy Johnson 05/10/1946 [id number]  Applied overhead frame with trapeze to patient bed.  Patient ID: Jeremy Johnson, male   DOB: Aug 07, 1946, 74 y.o.   MRN: [id number]   [name] 04/29/2020, 3:54 PM

## 2020-04-29 NOTE — Progress Notes (Signed)
PT Cancellation Note  Patient Details Name: DAJAUN GOLDRING MRN: [id number] DOB: 1946-03-02   Cancelled Treatment:    Reason Eval/Treat Not Completed: Active bedrest order     Wyona Almas, PT, DPT Acute Rehabilitation Services Pager [phone] Office [phone]    Deno Etienne 04/29/2020, 4:00 PM

## 2020-04-29 NOTE — ED Triage Notes (Signed)
Pt c/o right hip pain after falling while trying to get his pants on. Pt is supposed to have dialysis today. Pt received 100mg  of fentanyl enroute to hospital.

## 2020-04-30 ENCOUNTER — Encounter (HOSPITAL_COMMUNITY)
Admission: AD | Disposition: A | Discharge status: Home Health | Payer: Self-pay | Source: Home / Self Care | Attending: Internal Medicine

## 2020-04-30 ENCOUNTER — Inpatient Hospital Stay (HOSPITAL_COMMUNITY): Payer: Medicare Other | Admitting: Certified Registered"

## 2020-04-30 ENCOUNTER — Inpatient Hospital Stay (HOSPITAL_COMMUNITY): Payer: Medicare Other

## 2020-04-30 ENCOUNTER — Encounter (HOSPITAL_COMMUNITY): Payer: Self-pay | Admitting: Internal Medicine

## 2020-04-30 ENCOUNTER — Other Ambulatory Visit (HOSPITAL_COMMUNITY): Payer: Medicare Other

## 2020-04-30 HISTORY — PX: HIP ARTHROPLASTY: SHX981

## 2020-04-30 LAB — CBC
HCT: 31.8 % — ABNORMAL LOW (ref 39.0–52.0)
Hemoglobin: 10.8 g/dL — ABNORMAL LOW (ref 13.0–17.0)
MCH: 33.6 pg (ref 26.0–34.0)
MCHC: 34 g/dL (ref 30.0–36.0)
MCV: 99.1 fL (ref 80.0–100.0)
Platelets: 168 10*3/uL (ref 150–400)
RBC: 3.21 MIL/uL — ABNORMAL LOW (ref 4.22–5.81)
RDW: 15.8 % — ABNORMAL HIGH (ref 11.5–15.5)
WBC: 5.8 10*3/uL (ref 4.0–10.5)
nRBC: 0 % (ref 0.0–0.2)

## 2020-04-30 LAB — RENAL FUNCTION PANEL
Albumin: 3.1 g/dL — ABNORMAL LOW (ref 3.5–5.0)
Anion gap: 10 (ref 5–15)
BUN: 17 mg/dL (ref 8–23)
CO2: 30 mmol/L (ref 22–32)
Calcium: 8.8 mg/dL — ABNORMAL LOW (ref 8.9–10.3)
Chloride: 94 mmol/L — ABNORMAL LOW (ref 98–111)
Creatinine, Ser: 5.93 mg/dL — ABNORMAL HIGH (ref 0.61–1.24)
GFR calc Af Amer: 10 mL/min — ABNORMAL LOW (ref >60)
GFR calc non Af Amer: 9 mL/min — ABNORMAL LOW (ref >60)
Glucose, Bld: 75 mg/dL (ref 70–99)
Phosphorus: 4.1 mg/dL (ref 2.5–4.6)
Potassium: 4.6 mmol/L (ref 3.5–5.1)
Sodium: 134 mmol/L — ABNORMAL LOW (ref 135–145)

## 2020-04-30 LAB — TYPE AND SCREEN
ABO/RH(D): O POS
Antibody Screen: NEGATIVE

## 2020-04-30 SURGERY — HEMIARTHROPLASTY, HIP, DIRECT ANTERIOR APPROACH, FOR FRACTURE
Anesthesia: Choice | Site: Hip | Laterality: Right

## 2020-04-30 SURGERY — HEMIARTHROPLASTY, HIP, DIRECT ANTERIOR APPROACH, FOR FRACTURE
Anesthesia: General | Site: Hip | Laterality: Right

## 2020-04-30 MED ORDER — FENTANYL CITRATE (PF) 250 MCG/5ML IJ SOLN
INTRAMUSCULAR | Status: AC
  Filled 2020-04-30: qty 5

## 2020-04-30 MED ORDER — FENTANYL CITRATE (PF) 100 MCG/2ML IJ SOLN: 25.0000 ug | INTRAMUSCULAR | Status: DC | PRN

## 2020-04-30 MED ORDER — TRAMADOL HCL 50 MG PO TABS: 50.0000 mg | ORAL_TABLET | Freq: Three times a day (TID) | ORAL | Status: DC | PRN

## 2020-04-30 MED ORDER — SODIUM CHLORIDE 0.9 % IV SOLN: INTRAVENOUS | Status: DC

## 2020-04-30 MED ORDER — METOCLOPRAMIDE HCL 5 MG/ML IJ SOLN: 5.0000 mg | Freq: Three times a day (TID) | INTRAMUSCULAR | Status: DC | PRN

## 2020-04-30 MED ORDER — PROPOFOL 10 MG/ML IV BOLUS
INTRAVENOUS | Status: DC | PRN
  Administered 2020-04-30: 100 mg via INTRAVENOUS

## 2020-04-30 MED ORDER — BISACODYL 5 MG PO TBEC: 5.0000 mg | DELAYED_RELEASE_TABLET | Freq: Every day | ORAL | Status: DC | PRN

## 2020-04-30 MED ORDER — OXYCODONE HCL 5 MG PO TABS: 5.0000 mg | ORAL_TABLET | Freq: Once | ORAL | Status: DC | PRN

## 2020-04-30 MED ORDER — ROCURONIUM BROMIDE 10 MG/ML (PF) SYRINGE
PREFILLED_SYRINGE | INTRAVENOUS | Status: DC | PRN
  Administered 2020-04-30: 50 mg via INTRAVENOUS
  Administered 2020-04-30: 20 mg via INTRAVENOUS
  Administered 2020-04-30: 10 mg via INTRAVENOUS

## 2020-04-30 MED ORDER — ORAL CARE MOUTH RINSE: 15.0000 mL | Freq: Once | OROMUCOSAL | Status: DC

## 2020-04-30 MED ORDER — SODIUM CHLORIDE 0.9 % IR SOLN
Status: DC | PRN
  Administered 2020-04-30: 3000 mL

## 2020-04-30 MED ORDER — DEXAMETHASONE SODIUM PHOSPHATE 10 MG/ML IJ SOLN
INTRAMUSCULAR | Status: DC | PRN
  Administered 2020-04-30: 5 mg via INTRAVENOUS

## 2020-04-30 MED ORDER — FENTANYL CITRATE (PF) 100 MCG/2ML IJ SOLN
INTRAMUSCULAR | Status: DC | PRN
  Administered 2020-04-30: 75 ug via INTRAVENOUS

## 2020-04-30 MED ORDER — NEPRO/CARBSTEADY PO LIQD
237.0000 mL | Freq: Two times a day (BID) | ORAL | Status: DC
  Administered 2020-05-01 – 2020-05-02 (×3): 237 mL via ORAL

## 2020-04-30 MED ORDER — PROPOFOL 10 MG/ML IV BOLUS
INTRAVENOUS | Status: AC
  Filled 2020-04-30: qty 20

## 2020-04-30 MED ORDER — ONDANSETRON HCL 4 MG PO TABS: 4.0000 mg | ORAL_TABLET | Freq: Four times a day (QID) | ORAL | Status: DC | PRN

## 2020-04-30 MED ORDER — SENNOSIDES-DOCUSATE SODIUM 8.6-50 MG PO TABS: 1.0000 | ORAL_TABLET | Freq: Every evening | ORAL | Status: DC | PRN

## 2020-04-30 MED ORDER — APIXABAN 2.5 MG PO TABS: 2.5000 mg | ORAL_TABLET | Freq: Two times a day (BID) | ORAL | Status: DC

## 2020-04-30 MED ORDER — OXYCODONE HCL 5 MG/5ML PO SOLN: 5.0000 mg | Freq: Once | ORAL | Status: DC | PRN

## 2020-04-30 MED ORDER — 0.9 % SODIUM CHLORIDE (POUR BTL) OPTIME
TOPICAL | Status: DC | PRN
  Administered 2020-04-30: 1000 mL

## 2020-04-30 MED ORDER — DOCUSATE SODIUM 100 MG PO CAPS
100.0000 mg | ORAL_CAPSULE | Freq: Two times a day (BID) | ORAL | Status: DC
  Administered 2020-04-30 – 2020-05-04 (×7): 100 mg via ORAL
  Filled 2020-04-30 (×7): qty 1

## 2020-04-30 MED ORDER — EPHEDRINE SULFATE 50 MG/ML IJ SOLN
INTRAMUSCULAR | Status: DC | PRN
  Administered 2020-04-30: 15 mg via INTRAVENOUS

## 2020-04-30 MED ORDER — ALUMINUM HYDROXIDE GEL 320 MG/5ML PO SUSP
15.0000 mL | ORAL | Status: DC | PRN
  Filled 2020-04-30: qty 30

## 2020-04-30 MED ORDER — PHENOL 1.4 % MT LIQD: 1.0000 | OROMUCOSAL | Status: DC | PRN

## 2020-04-30 MED ORDER — ONDANSETRON HCL 4 MG/2ML IJ SOLN: 4.0000 mg | Freq: Four times a day (QID) | INTRAMUSCULAR | Status: DC | PRN

## 2020-04-30 MED ORDER — PHENYLEPHRINE HCL-NACL 10-0.9 MG/250ML-% IV SOLN
INTRAVENOUS | Status: DC | PRN
  Administered 2020-04-30: 25 ug/min via INTRAVENOUS

## 2020-04-30 MED ORDER — MENTHOL 3 MG MT LOZG: 1.0000 | LOZENGE | OROMUCOSAL | Status: DC | PRN

## 2020-04-30 MED ORDER — ACETAMINOPHEN 325 MG PO TABS
325.0000 mg | ORAL_TABLET | Freq: Four times a day (QID) | ORAL | Status: DC | PRN
  Administered 2020-05-01 – 2020-05-02 (×4): 650 mg via ORAL
  Filled 2020-04-30 (×2): qty 2

## 2020-04-30 MED ORDER — CEFAZOLIN SODIUM-DEXTROSE 1-4 GM/50ML-% IV SOLN
1.0000 g | INTRAVENOUS | Status: AC
  Administered 2020-05-01 – 2020-05-02 (×2): 1 g via INTRAVENOUS
  Filled 2020-04-30 (×2): qty 50

## 2020-04-30 MED ORDER — ONDANSETRON HCL 4 MG/2ML IJ SOLN: 4.0000 mg | Freq: Once | INTRAMUSCULAR | Status: DC | PRN

## 2020-04-30 MED ORDER — ACETAMINOPHEN 500 MG PO TABS
500.0000 mg | ORAL_TABLET | Freq: Three times a day (TID) | ORAL | Status: DC
  Administered 2020-04-30 – 2020-05-04 (×9): 500 mg via ORAL
  Filled 2020-04-30 (×10): qty 1

## 2020-04-30 MED ORDER — METOCLOPRAMIDE HCL 5 MG PO TABS: 5.0000 mg | ORAL_TABLET | Freq: Three times a day (TID) | ORAL | Status: DC | PRN

## 2020-04-30 MED ORDER — PHENYLEPHRINE 40 MCG/ML (10ML) SYRINGE FOR IV PUSH (FOR BLOOD PRESSURE SUPPORT)
PREFILLED_SYRINGE | INTRAVENOUS | Status: DC | PRN
  Administered 2020-04-30: 80 ug via INTRAVENOUS

## 2020-04-30 MED ORDER — LIDOCAINE 2% (20 MG/ML) 5 ML SYRINGE
INTRAMUSCULAR | Status: DC | PRN
  Administered 2020-04-30: 40 mg via INTRAVENOUS

## 2020-04-30 MED ORDER — CHLORHEXIDINE GLUCONATE 0.12 % MT SOLN
15.0000 mL | Freq: Once | OROMUCOSAL | Status: DC
  Filled 2020-04-30: qty 15

## 2020-04-30 SURGICAL SUPPLY — 58 items
BALL HIP DEPUY 53 (Hips) IMPLANT
BLADE SAW SAG 73X25 THK (BLADE) ×2
BLADE SAW SGTL 73X25 THK (BLADE) ×1 IMPLANT
BRUSH FEMORAL CANAL (MISCELLANEOUS) IMPLANT
BRUSH SCRUB EZ PLAIN DRY (MISCELLANEOUS) ×6 IMPLANT
COVER SURGICAL LIGHT HANDLE (MISCELLANEOUS) ×3 IMPLANT
COVER WAND RF STERILE (DRAPES) ×3 IMPLANT
DRAPE INCISE IOBAN 85X60 (DRAPES) ×3 IMPLANT
DRAPE ORTHO SPLIT 77X108 STRL (DRAPES) ×6
DRAPE SURG ORHT 6 SPLT 77X108 (DRAPES) ×2 IMPLANT
DRAPE U-SHAPE 47X51 STRL (DRAPES) ×3 IMPLANT
DRSG MEPILEX BORDER 4X4 (GAUZE/BANDAGES/DRESSINGS) ×2 IMPLANT
DRSG MEPILEX BORDER 4X8 (GAUZE/BANDAGES/DRESSINGS) ×3 IMPLANT
ELECT BLADE 6.5 EXT (BLADE) IMPLANT
ELECT CAUTERY BLADE 6.4 (BLADE) IMPLANT
ELECT REM PT RETURN 9FT ADLT (ELECTROSURGICAL) ×3
ELECTRODE REM PT RTRN 9FT ADLT (ELECTROSURGICAL) ×1 IMPLANT
GLOVE BIO SURGEON STRL SZ7.5 (GLOVE) ×3 IMPLANT
GLOVE BIO SURGEON STRL SZ8 (GLOVE) ×3 IMPLANT
GLOVE BIOGEL PI IND STRL 8 (GLOVE) ×2 IMPLANT
GLOVE BIOGEL PI INDICATOR 8 (GLOVE) ×4
GOWN STRL REUS W/ TWL LRG LVL3 (GOWN DISPOSABLE) ×2 IMPLANT
GOWN STRL REUS W/ TWL XL LVL3 (GOWN DISPOSABLE) ×1 IMPLANT
GOWN STRL REUS W/TWL 2XL LVL3 (GOWN DISPOSABLE) IMPLANT
GOWN STRL REUS W/TWL LRG LVL3 (GOWN DISPOSABLE) ×6
GOWN STRL REUS W/TWL XL LVL3 (GOWN DISPOSABLE) ×3
HANDPIECE INTERPULSE COAX TIP (DISPOSABLE)
HIP BALL DEPUY 53 (Hips) ×3 IMPLANT
KIT BASIN OR (CUSTOM PROCEDURE TRAY) ×3 IMPLANT
KIT TURNOVER KIT B (KITS) ×3 IMPLANT
MANIFOLD NEPTUNE II (INSTRUMENTS) ×3 IMPLANT
NDL 1/2 CIR MAYO (NEEDLE) IMPLANT
NEEDLE 1/2 CIR MAYO (NEEDLE) IMPLANT
NS IRRIG 1000ML POUR BTL (IV SOLUTION) ×3 IMPLANT
PACK TOTAL JOINT (CUSTOM PROCEDURE TRAY) ×3 IMPLANT
PAD ARMBOARD 7.5X6 YLW CONV (MISCELLANEOUS) ×6 IMPLANT
PILLOW ABDUCTION MEDIUM (MISCELLANEOUS) ×2 IMPLANT
PRESSURIZER FEMORAL UNIV (MISCELLANEOUS) IMPLANT
RETRIEVER SUT HEWSON (MISCELLANEOUS) ×3 IMPLANT
SET HNDPC FAN SPRY TIP SCT (DISPOSABLE) IMPLANT
SPACER FEM TAPERED +5 12/14 (Hips) ×2 IMPLANT
STAPLER VISISTAT 35W (STAPLE) ×3 IMPLANT
STEM OFFSET DUOFIX SZ6 STD (Stem) ×2 IMPLANT
SUT ETHILON 2 0 PSLX (SUTURE) ×6 IMPLANT
SUT FIBERWIRE #2 38 T-5 BLUE (SUTURE) ×6
SUT VIC AB 0 CT1 27 (SUTURE) ×3
SUT VIC AB 0 CT1 27XBRD ANBCTR (SUTURE) IMPLANT
SUT VIC AB 1 CT1 18XCR BRD 8 (SUTURE) ×1 IMPLANT
SUT VIC AB 1 CT1 27 (SUTURE) ×6
SUT VIC AB 1 CT1 27XBRD ANBCTR (SUTURE) ×2 IMPLANT
SUT VIC AB 1 CT1 8-18 (SUTURE) ×6
SUT VIC AB 2-0 CT1 27 (SUTURE) ×6
SUT VIC AB 2-0 CT1 TAPERPNT 27 (SUTURE) ×2 IMPLANT
SUTURE FIBERWR #2 38 T-5 BLUE (SUTURE) ×2 IMPLANT
TOWEL GREEN STERILE (TOWEL DISPOSABLE) ×3 IMPLANT
TOWEL GREEN STERILE FF (TOWEL DISPOSABLE) ×3 IMPLANT
TOWER CARTRIDGE SMART MIX (DISPOSABLE) IMPLANT
WATER STERILE IRR 1000ML POUR (IV SOLUTION) ×3 IMPLANT

## 2020-04-30 NOTE — Anesthesia Preprocedure Evaluation (Signed)
Anesthesia Evaluation  Patient identified by MRN, date of birth, ID band Patient awake    Reviewed: Allergy & Precautions, NPO status , Patient's Chart, lab work & pertinent test results  Airway Mallampati: II  TM Distance: >3 FB Neck ROM: Full    Dental  (+) Teeth Intact, Dental Advisory Given   Pulmonary Patient abstained from smoking., former smoker,    breath sounds clear to auscultation       Cardiovascular hypertension,  Rhythm:Regular Rate:Normal     Neuro/Psych    GI/Hepatic   Endo/Other    Renal/GU      Musculoskeletal   Abdominal   Peds  Hematology   Anesthesia Other Findings   Reproductive/Obstetrics                             Anesthesia Physical Anesthesia Plan  ASA: III  Anesthesia Plan: General   Post-op Pain Management:    Induction: Intravenous  PONV Risk Score and Plan: Ondansetron and Dexamethasone  Airway Management Planned: Oral ETT  Additional Equipment:   Intra-op Plan:   Post-operative Plan:   Informed Consent: I have reviewed the patients History and Physical, chart, labs and discussed the procedure including the risks, benefits and alternatives for the proposed anesthesia with the patient or authorized representative who has indicated his/her understanding and acceptance.     Dental advisory given  Plan Discussed with: CRNA and Anesthesiologist  Anesthesia Plan Comments:         Anesthesia Quick Evaluation

## 2020-04-30 NOTE — Progress Notes (Signed)
PT Cancellation Note  Patient Details Name: Jeremy Johnson MRN: [id number] DOB: June 08, 1946   Cancelled Treatment:    Reason Eval/Treat Not Completed: Patient at procedure or test/unavailable (to OR for procedure). Will follow-up for PT evaluation post-op as schedule permits.  Mabeline Caras, PT, DPT Acute Rehabilitation Services  Pager [phone] Office [place] 04/30/2020, 12:53 PM

## 2020-04-30 NOTE — Consult Note (Signed)
Reason for Consult:Right hip fx Referring Physician: [name] is an 74 y.o. male.  HPI: Jeremy Johnson was putting on his pants yesterday. He had one leg in and then lost his balance and fell. He had immediate right hip pain and could not get up. He was taken to [facility] where x-rays showed a right femoral neck fx. Orthopedic surgery was consulted and he was transferred to [facility] for definitive treatment.  Past Medical History:  Diagnosis Date  . Anemia   . Blood transfusion without reported diagnosis   . CAD (coronary artery disease)    STENT... MID CIRCUMFLEX...1997  . Chronic kidney disease    STAGE 3  . COPD (chronic obstructive pulmonary disease) ([place])   . Degenerative joint disease (DJD) of lumbar spine   . GERD (gastroesophageal reflux disease)   . Gout   . Hyperlipidemia   . Hypertension   . Hypothyroidism   . Incisional hernia    abdomen  . Leukocytosis    CHRONIC MILD  . Myocardial infarction ([place])    1997  . SCL CA dx'd 01/2019   Lung cancer    Past Surgical History:  Procedure Laterality Date  . ABDOMINAL AORTIC ANEURYSM REPAIR  2006  . AV FISTULA PLACEMENT Left 04/25/2019   Procedure: ARTERIOVENOUS (AV) FISTULA CREATION LEFT ARM;  Surgeon: Angelia Mould, MD;  Location: [place];  Service: Vascular;  Laterality: Left;  . AV FISTULA PLACEMENT Left 05/19/2019   Procedure: CONVERSION OF LEFT ARM ARTERIOVENOUS FISTULA TO GRAFT;  Surgeon: Angelia Mould, MD;  Location: [place];  Service: Vascular;  Laterality: Left;  . BIOPSY  09/30/2018   Procedure: BIOPSY;  Surgeon: Danie Binder, MD;  [facility];  Service: Endoscopy;;  ascending colon  . COLONOSCOPY  2008  . COLONOSCOPY N/A 09/30/2018   Procedure: COLONOSCOPY;  Surgeon: Danie Binder, MD;  [facility];  Service: Endoscopy;  Laterality: N/A;  9:00  . CORONARY ANGIOPLASTY WITH STENT [place]  . IR FLUORO GUIDE CV LINE RIGHT  04/22/2019  . IR US GUIDE VASC  ACCESS RIGHT  04/22/2019  . POLYPECTOMY  09/30/2018   Procedure: POLYPECTOMY;  Surgeon: Danie Binder, MD;  [facility];  Service: Endoscopy;;  colon  . VIDEO BRONCHOSCOPY WITH ENDOBRONCHIAL NAVIGATION N/A 01/27/2019   Procedure: VIDEO BRONCHOSCOPY WITH ENDOBRONCHIAL NAVIGATION;  Surgeon: Grace Isaac, MD;  Location: [place];  Service: Thoracic;  Laterality: N/A;  . VIDEO BRONCHOSCOPY WITH ENDOBRONCHIAL ULTRASOUND N/A 01/27/2019   Procedure: VIDEO BRONCHOSCOPY WITH ENDOBRONCHIAL ULTRASOUND;  Surgeon: Grace Isaac, MD;  Location: [facility] OR;  Service: Thoracic;  Laterality: N/A;    Family History  Problem Relation Age of Onset  . Stroke Brother   . Lung cancer Sister 52       lung cancer/former    Social History:  reports that he quit smoking about 2 years ago. He has a 27.00 pack-year smoking history. He has never used smokeless tobacco. He reports current alcohol use. He reports that he does not use drugs.  Allergies:  Allergies  Allergen Reactions  . Advair Hfa [Fluticasone-Salmeterol] Other (See Comments)    Developed thrush, although the mouth WAS being rinsed as directed  . Penicillins Rash    Has patient had a PCN reaction causing immediate rash, facial/tongue/throat swelling, SOB or lightheadedness with hypotension: No Has patient had a PCN reaction causing severe rash involving mucus membranes or skin necrosis:  No Has patient had a PCN reaction that required hospitalization: No Has patient had a PCN reaction occurring within the last 10 years: No If all of the above answers are "NO", then may proceed with Cephalosporin use.     Medications: I have reviewed the patient's current medications.  Results for orders placed or performed during the hospital encounter of 04/29/20 (from the past 48 hour(s))  Basic metabolic panel     Status: Abnormal   Collection Time: 04/29/20  7:12 AM  Result Value Ref Range   Sodium 135 135 - 145 mmol/L   Potassium 4.6 3.5 - 5.1  mmol/L   Chloride 94 (L) 98 - 111 mmol/L   CO2 27 22 - 32 mmol/L   Glucose, Bld 135 (H) 70 - 99 mg/dL    Comment: Glucose reference range applies only to samples taken after fasting for at least 8 hours.   BUN 31 (H) 8 - 23 mg/dL   Creatinine, Ser 8.36 (H) 0.61 - 1.24 mg/dL   Calcium 9.0 8.9 - 10.3 mg/dL   GFR calc non Af Amer 6 (L) >60 mL/min   GFR calc Af Amer 7 (L) >60 mL/min   Anion gap 14 5 - 15    Comment: Performed at [facility], [address], [place], [place] [address]  CBC WITH DIFFERENTIAL     Status: Abnormal   Collection Time: 04/29/20  7:12 AM  Result Value Ref Range   WBC 7.9 4.0 - 10.5 K/uL   RBC 3.10 (L) 4.22 - 5.81 MIL/uL   Hemoglobin 10.3 (L) 13.0 - 17.0 g/dL   HCT 31.0 (L) 39 - 52 %   MCV 100.0 80.0 - 100.0 fL   MCH 33.2 26.0 - 34.0 pg   MCHC 33.2 30.0 - 36.0 g/dL   RDW 16.0 (H) 11.5 - 15.5 %   Platelets 183 150 - 400 K/uL   nRBC 0.0 0.0 - 0.2 %   Neutrophils Relative % 71 %   Neutro Abs 5.7 1.7 - 7.7 K/uL   Lymphocytes Relative 18 %   Lymphs Abs 1.4 0.7 - 4.0 K/uL   Monocytes Relative 7 %   Monocytes Absolute 0.6 0 - 1 K/uL   Eosinophils Relative 2 %   Eosinophils Absolute 0.1 0 - 0 K/uL   Basophils Relative 1 %   Basophils Absolute 0.1 0 - 0 K/uL   Immature Granulocytes 1 %   Abs Immature Granulocytes 0.05 0.00 - 0.07 K/uL    Comment: Performed at [facility], [address], [place], [place] [address]  Protime-INR     Status: None   Collection Time: 04/29/20  7:12 AM  Result Value Ref Range   Prothrombin Time 13.5 11.4 - 15.2 seconds   INR 1.1 0.8 - 1.2    Comment: (NOTE) INR goal varies based on device and disease states. Performed at [facility], [address], [place], [place] [address]   Type and screen [facility]     Status: None   Collection Time: 04/29/20  7:12 AM  Result Value Ref Range   ABO/RH(D) O POS    Antibody Screen NEG    Sample Expiration      05/02/2020,2359 Performed at [facility], [address]  [place], [place] [address]   SARS Coronavirus 2 by RT PCR (hospital order, performed in [facility] hospital lab) Nasopharyngeal Nasopharyngeal Swab     Status: None   Collection Time: 04/29/20  8:35 AM   Specimen: Nasopharyngeal Swab  Result Value Ref Range   SARS Coronavirus 2 NEGATIVE NEGATIVE    Comment: (NOTE) SARS-CoV-2 target nucleic acids are NOT DETECTED.  The SARS-CoV-2 RNA is generally detectable in upper and lower respiratory specimens during the acute phase of infection. The lowest concentration of SARS-CoV-2 viral copies this assay can detect is 250 copies / mL. A negative result does not preclude SARS-CoV-2 infection and should not be used as the sole basis for treatment or other patient management decisions.  A negative result may occur with improper specimen collection / handling, submission of specimen other than nasopharyngeal swab, presence of viral mutation(s) within the areas targeted by this assay, and inadequate number of viral copies (<250 copies / mL). A negative result must be combined with clinical observations, patient history, and epidemiological information.  Fact Sheet for Patients:   StrictlyIdeas.no  Fact Sheet for Healthcare Providers: BankingDealers.co.za  This test is not yet approved or  cleared by the [place] FDA and has been authorized for detection and/or diagnosis of SARS-CoV-2 by FDA under an Emergency Use Authorization (EUA).  This EUA will remain in effect (meaning this test can be used) for the duration of the COVID-19 declaration under Section 564(b)(1) of the Act, 21 U.S.C. section 360bbb-3(b)(1), unless the authorization is terminated or revoked sooner.  Performed at [facility], [address], [place], [place] [address]   MRSA PCR Screening     Status: None   Collection Time: 04/29/20  5:31 PM   Specimen: Nasal Mucosa; Nasopharyngeal  Result Value Ref Range   MRSA by PCR NEGATIVE  NEGATIVE    Comment:        The GeneXpert MRSA Assay (FDA approved for NASAL specimens only), is one component of a comprehensive MRSA colonization surveillance program. It is not intended to diagnose MRSA infection nor to guide or monitor treatment for MRSA infections. Performed at [facility], [place] [address], [place], [place] [address]   Renal function panel     Status: Abnormal   Collection Time: 04/30/20  5:15 AM  Result Value Ref Range   Sodium 134 (L) 135 - 145 mmol/L   Potassium 4.6 3.5 - 5.1 mmol/L   Chloride 94 (L) 98 - 111 mmol/L   CO2 30 22 - 32 mmol/L   Glucose, Bld 75 70 - 99 mg/dL    Comment: Glucose reference range applies only to samples taken after fasting for at least 8 hours.   BUN 17 8 - 23 mg/dL   Creatinine, Ser 5.93 (H) 0.61 - 1.24 mg/dL   Calcium 8.8 (L) 8.9 - 10.3 mg/dL   Phosphorus 4.1 2.5 - 4.6 mg/dL   Albumin 3.1 (L) 3.5 - 5.0 g/dL   GFR calc non Af Amer 9 (L) >60 mL/min   GFR calc Af Amer 10 (L) >60 mL/min   Anion gap 10 5 - 15    Comment: Performed at [place] [address] [place],  [address]  CBC     Status: Abnormal   Collection Time: 04/30/20  5:15 AM  Result Value Ref Range   WBC 5.8 4.0 - 10.5 K/uL   RBC 3.21 (L) 4.22 - 5.81 MIL/uL   Hemoglobin 10.8 (L) 13.0 - 17.0 g/dL   HCT 31.8 (L) 39 - 52 %   MCV 99.1 80.0 - 100.0 fL   MCH 33.6 26.0 - 34.0 pg   MCHC 34.0 30.0 - 36.0 g/dL   RDW 15.8 (H) 11.5 - 15.5 %  Platelets 168 150 - 400 K/uL   nRBC 0.0 0.0 - 0.2 %    Comment: Performed at [facility], [place] [address] [place], [place] [address]    DG Chest 1 View  Result Date: 04/29/2020 CLINICAL DATA:  Fall with right hip pain EXAM: CHEST  1 VIEW COMPARISON:  04/06/2020 FINDINGS: Mild cardiomegaly. Widened upper mediastinum with known thoracic aortic aneurysm. Infiltrate around the left hilum with volume loss correlating with history of lung cancer and radiotherapy. Bilateral interstitial prominence that is  mildly asymmetric to the right. No visible effusion or pneumothorax. IMPRESSION: 1. Interstitial prominence, possible mild edema. 2. Post treatment left hilum. 3. Known thoracic aortic aneurysm. Electronically Signed   By: Monte Fantasia M.D.   On: 04/29/2020 07:31   DG Hip Unilat With Pelvis 2-3 Views Right  Result Date: 04/29/2020 CLINICAL DATA:  Fall. EXAM: DG HIP (WITH OR WITHOUT PELVIS) 2-3V RIGHT COMPARISON:  03/09/2016 FINDINGS: Right femoral neck fracture without measurable displacement. Located hip. No significant acetabular changes. Osteopenia. IMPRESSION: Acute right femoral neck fracture. Electronically Signed   By: Monte Fantasia M.D.   On: 04/29/2020 07:32    Review of Systems  HENT: Negative for ear discharge, ear pain, hearing loss and tinnitus.   Eyes: Negative for photophobia and pain.  Respiratory: Negative for cough and shortness of breath.   Cardiovascular: Negative for chest pain.  Gastrointestinal: Negative for abdominal pain, nausea and vomiting.  Genitourinary: Negative for dysuria, flank pain, frequency and urgency.  Musculoskeletal: Positive for arthralgias (Right hip). Negative for back pain, myalgias and neck pain.  Neurological: Negative for dizziness and headaches.  Hematological: Does not bruise/bleed easily.  Psychiatric/Behavioral: The patient is not nervous/anxious.    Blood pressure (!) 144/85, pulse 74, temperature 98 F (36.7 C), temperature source Oral, resp. rate 17, height 5\' 9"  (1.753 m), weight 67.8 kg, SpO2 93 %. Physical Exam  Constitutional: He appears well-developed. No distress.  HENT:  Head: Normocephalic and atraumatic.  Eyes: Conjunctivae are normal. Right eye exhibits no discharge. Left eye exhibits no discharge. No scleral icterus.  Cardiovascular: Normal rate and regular rhythm.  Respiratory: Effort normal. No respiratory distress.  Musculoskeletal:     Cervical back: Normal range of motion.     Comments: RLE No traumatic wounds,  ecchymosis, or rash  Mod TTP hip  No knee or ankle effusion  Knee stable to varus/ valgus and anterior/posterior stress  Sens DPN, SPN, TN intact  Motor EHL, ext, flex, evers 5/5  DP 1+, PT 1+, No significant edema  Neurological: He is alert.  Skin: Skin is warm and dry. He is not diaphoretic.  Psychiatric: His behavior is normal.    Assessment/Plan: Right hip fx -- Plan hip hemi today by Dr. Marcelino Scot. Please keep NPO. Multiple medical problems including ESRD (T-T-S) at [facility], coronary artery disease, COPD, hypertension, hyperlipidemia, small cell lung cancer, and hypothyroidism -- per primary service    Lisette Abu, PA-C Orthopedic Surgery [phone] 04/30/2020, 9:15 AM

## 2020-04-30 NOTE — Transfer of Care (Signed)
Immediate Anesthesia Transfer of Care Note  Patient: Jeremy Johnson  Procedure(s) Performed: ARTHROPLASTY  HIP (HEMIARTHROPLASTY) (Right Hip)  Patient Location: PACU  Anesthesia Type:General  Level of Consciousness: awake  Airway & Oxygen Therapy: Patient Spontanous Breathing and Patient connected to face mask oxygen  Post-op Assessment: Report given to RN and Post -op Vital signs reviewed and stable  Post vital signs: Reviewed and stable  Last Vitals:  Vitals Value Taken Time  BP 123/82 04/30/20 1435  Temp    Pulse 65 04/30/20 1437  Resp 12 04/30/20 1437  SpO2 96 % 04/30/20 1437  Vitals shown include unvalidated device data.  Last Pain:  Vitals:   04/30/20 0807  TempSrc:   PainSc: 8          Complications: No complications documented.

## 2020-04-30 NOTE — Progress Notes (Signed)
PROGRESS NOTE    REGINALDO HAZARD  DDU:[id number] DOB: June 23, 1946 DOA: 04/29/2020 PCP: [name], MD    Brief Narrative:  74 y.o. male with medical history of ESRD (T-T-S) at [facility], coronary artery disease, COPD, hypertension, hyperlipidemia, small cell lung cancer, hypothyroidism presenting after mechanical fall while getting dressed to go to dialysis in the early a.m. 04/29/2020.  The patient denies any syncope.  The patient had been in his usual state of health until his mechanical fall.  He had denied any fevers, chills, headache, chest pain, shortness breath, coughing, hemoptysis, nausea, vomiting, diarrhea, abdominal pain, hematochezia, melena, dizziness.  The patient has a remote history of MI back in 1997 but has not seen a cardiologist for over 10 years after he was released by Dr. Lia Foyer for being medically stable.  The patient denies any history of stroke.  He is compliant with all his medications.  He was last dialyzed on 04/27/2020 and had a full dialysis session. In the emergency department, the patient was afebrile hemodynamically stable with oxygen saturation 97% on room air.  WBC 7.9, hemoglobin 10.3, platelets 183,000.  Potassium 4.6, sodium 135, serum creatinine 8.36.  Nephrology and orthopedics were consulted to assist with management.  Assessment & Plan:   Active Problems:   Essential hypertension   Hyperlipidemia   Small cell lung cancer (HCC)   Anemia in chronic kidney disease (CKD)   ESRD (end stage renal disease) on dialysis (HCC)   Hypothyroidism, unspecified   Closed displaced fracture of right femoral neck with delayed healing   Closed displaced fracture of right femoral neck (HCC)  Right femoral neck fracture -Orthopedics consulted and pt now s/p R hemiarthoplasty 6/18 -Cont post-op care per Orthopedic Surgey  ESRD -Dialyzes Tuesday, Thursday, Saturday -Nephrology following, continue HD as tolerated  Coronary artery disease -No chest pain  presently -Aspirin was held perioperatively  in anticipation for surgery -Stable  Non-small cell lung cancer -Patient follows Dr. Earlie Server -Status post 4 cycles carboplatin and etoposide -Currently under observation -Most recent CT reportedly did not show any recurrence  Essential hypertension -Continued on metoprolol tartrate, amlodipine and clonidine  COPD/pulmonary fibrosis -Stable on room air -Continue Advair home dose  Hypothyroidism -Continue with Synthroid  Hyperlipidemia -Continue Crestor as tolerated  Restless leg syndrome -Continue pramipexole  GERD -Continue PPI  DVT prophylaxis: eliquis Code Status: Full Family Communication: Pt in room, family is at bedside  Status is: Inpatient  Remains inpatient appropriate because:Unsafe d/c plan and Inpatient level of care appropriate due to severity of illness   Dispo: The patient is from: Home              Anticipated d/c is to: pending PT eval              Anticipated d/c date is: 2 days              Patient currently is not medically stable to d/c.       Consultants:   Orthopedic Surgery  Nephrology  Procedures:   R hemiarthoplasty 6/18  Antimicrobials: Anti-infectives (From admission, onward)   Start     Dose/Rate Route Frequency Ordered Stop   05/01/20 1200  ceFAZolin (ANCEF) IVPB 1 g/50 mL premix     Discontinue    Note to Pharmacy: Tolerated intra-op ancef   1 g 100 mL/hr over 30 Minutes Intravenous Every 24 hours 04/30/20 1546 05/03/20 1159   04/30/20 0800  ceFAZolin (ANCEF) IVPB 2g/100 mL premix  2 g 200 mL/hr over 30 Minutes Intravenous To [facility] 04/29/20 1644 04/30/20 1238       Subjective: Seen this AM prior to surgery. Eager to have surgery  Objective: Vitals:   04/30/20 1450 04/30/20 1505 04/30/20 1520 04/30/20 1544  BP: 117/70 127/74 127/69 137/77  Pulse: 64 65 67 68  Resp: 11 17 19 17   Temp:   97.8 F (36.6 C) 97.8 F (36.6 C)  TempSrc:     Oral  SpO2: 94% 93% 93% 94%  Weight:      Height:        Intake/Output Summary (Last 24 hours) at 04/30/2020 1706 Last data filed at 04/30/2020 1500 Gross per 24 hour  Intake 750 ml  Output 100 ml  Net 650 ml   Filed Weights   04/29/20 1330 04/29/20 1459 04/30/20 1058  Weight: 72.4 kg 67.8 kg 67.8 kg    Examination:  General exam: Appears calm and comfortable  Respiratory system: Clear to auscultation. Respiratory effort normal. Cardiovascular system: S1 & S2 heard, Regular Gastrointestinal system: Abdomen is nondistended, soft and nontender. No organomegaly or masses felt. Normal bowel sounds heard. Central nervous system: Alert and oriented. No focal neurological deficits. Extremities: Symmetric 5 x 5 power. Skin: No rashes, lesions  Psychiatry: Judgement and insight appear normal. Mood & affect appropriate.   Data Reviewed: I have personally reviewed following labs and imaging studies  CBC: Recent Labs  Lab 04/29/20 0712 04/30/20 0515  WBC 7.9 5.8  NEUTROABS 5.7  --   HGB 10.3* 10.8*  HCT 31.0* 31.8*  MCV 100.0 99.1  PLT 183 010   Basic Metabolic Panel: Recent Labs  Lab 04/29/20 0712 04/30/20 0515  NA 135 134*  K 4.6 4.6  CL 94* 94*  CO2 27 30  GLUCOSE 135* 75  BUN 31* 17  CREATININE 8.36* 5.93*  CALCIUM 9.0 8.8*  PHOS  --  4.1   GFR: Estimated Creatinine Clearance: 10.6 mL/min (A) (by C-G formula based on SCr of 5.93 mg/dL (H)). Liver Function Tests: Recent Labs  Lab 04/30/20 0515  ALBUMIN 3.1*   No results for input(s): LIPASE, AMYLASE in the last 168 hours. No results for input(s): AMMONIA in the last 168 hours. Coagulation Profile: Recent Labs  Lab 04/29/20 0712  INR 1.1   Cardiac Enzymes: No results for input(s): CKTOTAL, CKMB, CKMBINDEX, TROPONINI in the last 168 hours. BNP (last 3 results) No results for input(s): PROBNP in the last 8760 hours. HbA1C: No results for input(s): HGBA1C in the last 72 hours. CBG: No results for  input(s): GLUCAP in the last 168 hours. Lipid Profile: No results for input(s): CHOL, HDL, LDLCALC, TRIG, CHOLHDL, LDLDIRECT in the last 72 hours. Thyroid Function Tests: No results for input(s): TSH, T4TOTAL, FREET4, T3FREE, THYROIDAB in the last 72 hours. Anemia Panel: No results for input(s): VITAMINB12, FOLATE, FERRITIN, TIBC, IRON, RETICCTPCT in the last 72 hours. Sepsis Labs: No results for input(s): PROCALCITON, LATICACIDVEN in the last 168 hours.  Recent Results (from the past 240 hour(s))  SARS Coronavirus 2 by RT PCR (hospital order, performed in [facility] hospital lab) Nasopharyngeal Nasopharyngeal Swab     Status: None   Collection Time: 04/29/20  8:35 AM   Specimen: Nasopharyngeal Swab  Result Value Ref Range Status   SARS Coronavirus 2 NEGATIVE NEGATIVE Final    Comment: (NOTE) SARS-CoV-2 target nucleic acids are NOT DETECTED.  The SARS-CoV-2 RNA is generally detectable in upper and lower respiratory specimens during the acute phase of  infection. The lowest concentration of SARS-CoV-2 viral copies this assay can detect is 250 copies / mL. A negative result does not preclude SARS-CoV-2 infection and should not be used as the sole basis for treatment or other patient management decisions.  A negative result may occur with improper specimen collection / handling, submission of specimen other than nasopharyngeal swab, presence of viral mutation(s) within the areas targeted by this assay, and inadequate number of viral copies (<250 copies / mL). A negative result must be combined with clinical observations, patient history, and epidemiological information.  Fact Sheet for Patients:   StrictlyIdeas.no  Fact Sheet for Healthcare Providers: BankingDealers.co.za  This test is not yet approved or  cleared by the [place] FDA and has been authorized for detection and/or diagnosis of SARS-CoV-2 by FDA under an Emergency Use  Authorization (EUA).  This EUA will remain in effect (meaning this test can be used) for the duration of the COVID-19 declaration under Section 564(b)(1) of the Act, 21 U.S.C. section 360bbb-3(b)(1), unless the authorization is terminated or revoked sooner.  Performed at [facility], [address] [place], [place] [address]   MRSA PCR Screening     Status: None   Collection Time: 04/29/20  5:31 PM   Specimen: Nasal Mucosa; Nasopharyngeal  Result Value Ref Range Status   MRSA by PCR NEGATIVE NEGATIVE Final    Comment:        The GeneXpert MRSA Assay (FDA approved for NASAL specimens only), is one component of a comprehensive MRSA colonization surveillance program. It is not intended to diagnose MRSA infection nor to guide or monitor treatment for MRSA infections. Performed at [facility], [place] [address] [place], [place] [address]      Radiology Studies: DG Chest 1 View  Result Date: 04/29/2020 CLINICAL DATA:  Fall with right hip pain EXAM: CHEST  1 VIEW COMPARISON:  04/06/2020 FINDINGS: Mild cardiomegaly. Widened upper mediastinum with known thoracic aortic aneurysm. Infiltrate around the left hilum with volume loss correlating with history of lung cancer and radiotherapy. Bilateral interstitial prominence that is mildly asymmetric to the right. No visible effusion or pneumothorax. IMPRESSION: 1. Interstitial prominence, possible mild edema. 2. Post treatment left hilum. 3. Known thoracic aortic aneurysm. Electronically Signed   By: Monte Fantasia M.D.   On: 04/29/2020 07:31   DG Hip Port Unilat With Pelvis 1V Right  Result Date: 04/30/2020 CLINICAL DATA:  Right femoral neck fracture. Status post hemiarthroplasty. EXAM: DG HIP (WITH OR WITHOUT PELVIS) 1V PORT RIGHT COMPARISON:  Radiographs dated 04/29/2020 FINDINGS: The proximal femoral prosthesis appears in excellent position. No acute bone abnormality. IMPRESSION: Satisfactory appearance of the right hip after insertion  of proximal femoral prosthesis. Electronically Signed   By: Lorriane Shire M.D.   On: 04/30/2020 15:48   DG Hip Unilat With Pelvis 2-3 Views Right  Result Date: 04/29/2020 CLINICAL DATA:  Fall. EXAM: DG HIP (WITH OR WITHOUT PELVIS) 2-3V RIGHT COMPARISON:  03/09/2016 FINDINGS: Right femoral neck fracture without measurable displacement. Located hip. No significant acetabular changes. Osteopenia. IMPRESSION: Acute right femoral neck fracture. Electronically Signed   By: Monte Fantasia M.D.   On: 04/29/2020 07:32    Scheduled Meds: . acetaminophen  500 mg Oral Q8H  . allopurinol  100 mg Oral q AM  . amLODipine  10 mg Oral QHS  . [START ON 05/01/2020] apixaban  2.5 mg Oral BID  . cetirizine  5 mg Oral QPM  . Chlorhexidine Gluconate Cloth  6 each Topical  [id number]  . cloNIDine  0.1 mg Oral BID  . docusate sodium  100 mg Oral BID  . [START ON 05/01/2020] feeding supplement (NEPRO CARB STEADY)  237 mL Oral BID BM  . levothyroxine  175 mcg Oral QAC breakfast  . metoprolol tartrate  25 mg Oral BID  . mometasone-formoterol  2 puff Inhalation BID  . multivitamin  1 tablet Oral q AM  . pantoprazole  80 mg Oral Daily  . pramipexole  0.25 mg Oral QHS  . rosuvastatin  10 mg Oral QHS   Continuous Infusions: . sodium chloride    . sodium chloride    . [START ON 05/01/2020]  ceFAZolin (ANCEF) IV       LOS: 1 day   Marylu Lund, MD [facility] Pager On Amion  If 7PM-7AM, please contact night-coverage 04/30/2020, 5:06 PM

## 2020-04-30 NOTE — Progress Notes (Signed)
Nutrition Follow-up  DOCUMENTATION CODES:   Not applicable  INTERVENTION:   -D/c Ensure Enlive po BID, each supplement provides 350 kcal and 20 grams of protein -Nepro Shake po BID, each supplement provides 425 kcal and 19 grams protein -Renal MVI daily  NUTRITION DIAGNOSIS:   Increased nutrient needs related to chronic illness, hip fracture (ESRD on HD; anticipated surgical managment of right hip fracture) as evidenced by estimated needs.  Ongoing  GOAL:   Patient will meet greater than or equal to 90% of their needs  Progressing  MONITOR:   PO intake, Supplement acceptance, Diet advancement, Labs, Weight trends, Skin, I & O's  REASON FOR ASSESSMENT:   Consult Hip fracture protocol  ASSESSMENT:   74 year old male with past medical history of ESRD on HD, CAD, COPD, HTN, HLD, small cell lung cancer, hypothyroidism presented after mechanical fall while getting dressed to go to dialysis admitted for right femoral neck fracture.  Reviewed I/O's: -2.1 L x 24 hours  Per nephrology notes, EDW 72 kg. Pt is below dry wt. Reviewed wt hx; pt has experienced a 16.2% wt loss over the past 3 months, which is significant for time frame.   Pt down in OR for rt hip hemiarthroplasty. No family available to provide additional history.   Pt with increased nutritional needs for post-operative healing and wound benefit from addition of oral nutrition supplements.   Labs reviewed.   Diet Order:   Diet Order            Diet NPO time specified  Diet effective midnight                 EDUCATION NEEDS:   No education needs have been identified at this time  Skin:  Skin Assessment: Skin Integrity Issues: Skin Integrity Issues:: Incisions Incisions: rt leg  Last BM:  Unknown  Height:   Ht Readings from Last 1 Encounters:  04/30/20 5\' 9"  (1.753 m)    Weight:   Wt Readings from Last 1 Encounters:  04/30/20 67.8 kg    Ideal Body Weight:  72.7 kg  BMI:  Body mass index  is 22.07 kg/m.  Estimated Nutritional Needs:   Kcal:  2050-2250  Protein:  105-120 grams  Fluid:  1000 ml + UOP    Loistine Chance, RD, LDN, [place] Registered Dietitian II Certified Diabetes Care and Education Specialist Please refer to [facility] for RD and/or RD on-call/weekend/after hours pager

## 2020-04-30 NOTE — Anesthesia Procedure Notes (Signed)
Procedure Name: Intubation Date/Time: 04/30/2020 12:13 PM Performed by: Imagene Riches, CRNA Pre-anesthesia Checklist: Patient identified, Emergency Drugs available, Suction available and Patient being monitored Patient Re-evaluated:Patient Re-evaluated prior to induction Oxygen Delivery Method: Circle System Utilized Preoxygenation: Pre-oxygenation with 100% oxygen Induction Type: IV induction Ventilation: Mask ventilation without difficulty Laryngoscope Size: Miller and 2 Grade View: Grade I Tube type: Oral Tube size: 7.5 mm Number of attempts: 1 Airway Equipment and Method: Stylet and Oral airway Placement Confirmation: ETT inserted through vocal cords under direct vision,  positive ETCO2 and breath sounds checked- equal and bilateral Secured at: 21 cm Tube secured with: Tape Dental Injury: Teeth and Oropharynx as per pre-operative assessment

## 2020-04-30 NOTE — Progress Notes (Addendum)
[facility] Progress Note   Subjective:  Dialyzed last night at [facility].  Plan for hip repair today per ortho. Pain controlled this am.   Objective Vitals:   04/29/20 1934 04/30/20 0102 04/30/20 0256 04/30/20 0744  BP: (!) 146/93 133/88 129/86 (!) 144/85  Pulse: 72 73 68 74  Resp: 16 17 17 17   Temp: 97.8 F (36.6 C) (!) 97.4 F (36.3 C) (!) 97.5 F (36.4 C) 98 F (36.7 C)  TempSrc: Oral Oral Oral Oral  SpO2: 93% 97% 97% 93%  Weight:      Height:        Weight change:   67.8 kg  Additional Objective Labs: Basic Metabolic Panel: Recent Labs  Lab 04/29/20 0712 04/30/20 0515  NA 135 134*  K 4.6 4.6  CL 94* 94*  CO2 27 30  GLUCOSE 135* 75  BUN 31* 17  CREATININE 8.36* 5.93*  CALCIUM 9.0 8.8*  PHOS  --  4.1   CBC: Recent Labs  Lab 04/29/20 0712 04/30/20 0515  WBC 7.9 5.8  NEUTROABS 5.7  --   HGB 10.3* 10.8*  HCT 31.0* 31.8*  MCV 100.0 99.1  PLT 183 168   Blood Culture    Component Value Date/Time   SDES BLOOD LEFT ARM 05/22/2019 1559   SDES BLOOD LEFT ARM 05/22/2019 1559   SPECREQUEST  05/22/2019 1559    BOTTLES DRAWN AEROBIC AND ANAEROBIC Blood Culture results may not be optimal due to an inadequate volume of blood received in culture bottles   SPECREQUEST  05/22/2019 1559    BOTTLES DRAWN AEROBIC AND ANAEROBIC Blood Culture adequate volume   CULT  05/22/2019 1559    NO GROWTH 5 DAYS Performed at [facility], [address], [place], [place] [address]    CULT  05/22/2019 1559    NO GROWTH 5 DAYS Performed at [facility], [place] [address] [place], [place] [address]    REPTSTATUS 05/27/2019 FINAL 05/22/2019 1559   REPTSTATUS 05/27/2019 FINAL 05/22/2019 1559     Physical Exam General: Elderly male on nasal oxygen, nad Heart: RRR Lungs: Clear bilaterally  Abdomen: soft, non-tender Extremities: no LE edema  Dialysis Access: LUE AVF +bruit   Medications: . [MAR Hold] sodium chloride    . [MAR Hold] sodium chloride     . sodium chloride    .  ceFAZolin (ANCEF) IV     . [MAR Hold] allopurinol  100 mg Oral q AM  . [MAR Hold] amLODipine  10 mg Oral QHS  . [MAR Hold] cetirizine  5 mg Oral QPM  . chlorhexidine  15 mL Mouth/Throat Once   Or  . mouth rinse  15 mL Mouth Rinse Once  . [MAR Hold] Chlorhexidine Gluconate Cloth  6 each Topical Q0600  . [MAR Hold] cloNIDine  0.1 mg Oral BID  . [MAR Hold] feeding supplement (ENSURE ENLIVE)  237 mL Oral BID BM  . [MAR Hold] heparin  5,000 Units Subcutaneous Q8H  . [MAR Hold] levothyroxine  175 mcg Oral QAC breakfast  . [MAR Hold] metoprolol tartrate  25 mg Oral BID  . [MAR Hold] mometasone-formoterol  2 puff Inhalation BID  . [MAR Hold] multivitamin  1 tablet Oral q AM  . [MAR Hold] pantoprazole  80 mg Oral Daily  . povidone-iodine  2 application Topical Once  . [MAR Hold] pramipexole  0.25 mg Oral QHS  . [MAR Hold] rosuvastatin  10 mg Oral QHS    OP HD: TTS [facility]  4h  400/A1.5  72kg   3K/2.5 bath  AVG   ? heparin  mircera 50 mcg every 4 weeks - last on 04/20/20   venofer 50 mg weekly  Calcitriol 0.75 mcg three times a week with HD   Assessment/Plan: 1. Right hip fx s/p mechanical fall. For hemiarthroplasty per ortho today. Avoid morphine in ESRD patient.  2. ESRD - HD TTS. K 4.6 . Continue on schedule.  Next HD 6/19 3. HTN/volume-  BP controlled. 2.1L UF on HD 6/17. On supp O2 here - OP EDW just lowered --UF as tolerated to optimize volume status.  4. Anemia-  Hb at goal. Follow.  5. MBD of CKD -  Ca/Phos at goal. Continue binders/calcitriol  6. Nutrition - Renal diet/prot supp when eating   Lynnda Child PA-C [facility] 04/30/2020,10:42 AM  Pt seen, examined and agree w A/P as above.  Kelly Splinter  MD 04/30/2020, 11:09 AM

## 2020-04-30 NOTE — Progress Notes (Signed)
OT Cancellation Note  Patient Details Name: [name] MRN: [id number] DOB: 10-Apr-1946   Cancelled Treatment:    Reason Eval/Treat Not Completed: Patient at procedure or test/ unavailable. Patient undergoing R hemiarthroplasty. Will continue efforts toward completion of OT evaluation when patient is available.   Lakeyia Surber R [place] 04/30/2020, 10:24 AM

## 2020-04-30 NOTE — Progress Notes (Addendum)
ANTICOAGULATION CONSULT NOTE - Initial Consult  Pharmacy Consult for Apixaban Indication: VTE prophylaxis post-hip surgery  Allergies  Allergen Reactions  . Advair Hfa [Fluticasone-Salmeterol] Other (See Comments)    Developed thrush, although the mouth WAS being rinsed as directed  . Penicillins Rash    Has patient had a PCN reaction causing immediate rash, facial/tongue/throat swelling, SOB or lightheadedness with hypotension: No Has patient had a PCN reaction causing severe rash involving mucus membranes or skin necrosis: No Has patient had a PCN reaction that required hospitalization: No Has patient had a PCN reaction occurring within the last 10 years: No If all of the above answers are "NO", then may proceed with Cephalosporin use.     Patient Measurements: Height: 5\' 9"  (175.3 cm) Weight: 67.8 kg (149 lb 7.6 oz) IBW/kg (Calculated) : 70.7  Vital Signs: Temp: 97.8 F (36.6 C) (06/18 1544) Temp Source: Oral (06/18 1544) BP: 137/77 (06/18 1544) Pulse Rate: 68 (06/18 1544)  Labs: Recent Labs    04/29/20 0712 04/30/20 0515  HGB 10.3* 10.8*  HCT 31.0* 31.8*  PLT 183 168  LABPROT 13.5  --   INR 1.1  --   CREATININE 8.36* 5.93*    Estimated Creatinine Clearance: 10.6 mL/min (A) (by C-G formula based on SCr of 5.93 mg/dL (H)).   Medical History: Past Medical History:  Diagnosis Date  . Anemia   . Blood transfusion without reported diagnosis   . CAD (coronary artery disease)    STENT... MID CIRCUMFLEX...1997  . Chronic kidney disease    STAGE 3  . COPD (chronic obstructive pulmonary disease) ([place])   . Degenerative joint disease (DJD) of lumbar spine   . GERD (gastroesophageal reflux disease)   . Gout   . Hyperlipidemia   . Hypertension   . Hypothyroidism   . Incisional hernia    abdomen  . Leukocytosis    CHRONIC MILD  . Myocardial infarction ([place])    1997  . SCL CA dx'd 01/2019   Lung cancer    Assessment: 74 yr old male with ESRD (TTS HD), CAD,  COPD, HTN, HLD, hx of lung cancer, hypothyroidism presented after fall on the morning of 04/29/20 and was diagnosed with R femoral neck fx; pt is S/P R hip hemiarthroplasty today. Pharmacy is consulted for apixaban for VTE prophylaxis post-ortho surgery (also has hx of lung cancer, which increases risk for thrombosis).  H/H stable at 10.8/31.8, platelets stable at 168. INR 1.1 on 04/29/20); Scr 5.93  Pt was receiving heparin 5000 units SQ Q 8 hrs for VTE prophylaxis prior to surgery (last dose at 2250 PM on 6/17).  Per RN, no bleeding observed post op   Goal of Therapy:  Prevention of DVT post-hip surgery (also with hx of lung cancer) Monitor platelets by anticoagulation protocol: Yes   Plan:  Apixaban 2.5 mg po BID, beginning 12-24 hrs after surgery (tomorrow AM) and continuing for 35 days Monitor CBC Monitor for signs/symptoms of bleeding  Gillermina Hu, PharmD, BCPS, [facility] Clinical Pharmacist 04/30/2020,4:01 PM

## 2020-04-30 NOTE — Progress Notes (Addendum)
Pt is A&O x4. NPO maint. To short stay, report was given. Pt's family present. 1530 Received pt from PACU, A&O x4. Right hip dressing dry and intact. Pt denies pain at this time.

## 2020-05-01 ENCOUNTER — Encounter (HOSPITAL_COMMUNITY): Payer: Self-pay | Admitting: Internal Medicine

## 2020-05-01 ENCOUNTER — Inpatient Hospital Stay (HOSPITAL_COMMUNITY): Payer: Medicare Other

## 2020-05-01 HISTORY — PX: IR FLUORO GUIDE CV LINE RIGHT: IMG2283

## 2020-05-01 HISTORY — PX: IR US GUIDE VASC ACCESS RIGHT: IMG2390

## 2020-05-01 LAB — CBC
HCT: 29.3 % — ABNORMAL LOW (ref 39.0–52.0)
Hemoglobin: 9.9 g/dL — ABNORMAL LOW (ref 13.0–17.0)
MCH: 33.4 pg (ref 26.0–34.0)
MCHC: 33.8 g/dL (ref 30.0–36.0)
MCV: 99 fL (ref 80.0–100.0)
Platelets: 155 10*3/uL (ref 150–400)
RBC: 2.96 MIL/uL — ABNORMAL LOW (ref 4.22–5.81)
RDW: 15.7 % — ABNORMAL HIGH (ref 11.5–15.5)
WBC: 6.4 10*3/uL (ref 4.0–10.5)
nRBC: 0 % (ref 0.0–0.2)

## 2020-05-01 LAB — BASIC METABOLIC PANEL
Anion gap: 13 (ref 5–15)
BUN: 35 mg/dL — ABNORMAL HIGH (ref 8–23)
CO2: 24 mmol/L (ref 22–32)
Calcium: 8.4 mg/dL — ABNORMAL LOW (ref 8.9–10.3)
Chloride: 96 mmol/L — ABNORMAL LOW (ref 98–111)
Creatinine, Ser: 8.02 mg/dL — ABNORMAL HIGH (ref 0.61–1.24)
GFR calc Af Amer: 7 mL/min — ABNORMAL LOW (ref >60)
GFR calc non Af Amer: 6 mL/min — ABNORMAL LOW (ref >60)
Glucose, Bld: 133 mg/dL — ABNORMAL HIGH (ref 70–99)
Potassium: 5.8 mmol/L — ABNORMAL HIGH (ref 3.5–5.1)
Sodium: 133 mmol/L — ABNORMAL LOW (ref 135–145)

## 2020-05-01 MED ORDER — HEPARIN SODIUM (PORCINE) 1000 UNIT/ML IJ SOLN
INTRAMUSCULAR | Status: AC
  Administered 2020-05-01: 1000 [IU]
  Filled 2020-05-01: qty 3

## 2020-05-01 MED ORDER — SODIUM ZIRCONIUM CYCLOSILICATE 10 G PO PACK
10.0000 g | PACK | Freq: Once | ORAL | Status: DC
  Filled 2020-05-01: qty 1

## 2020-05-01 MED ORDER — ACETAMINOPHEN 325 MG PO TABS
ORAL_TABLET | ORAL | Status: AC
  Filled 2020-05-01: qty 2

## 2020-05-01 MED ORDER — LIDOCAINE HCL 1 % IJ SOLN
INTRAMUSCULAR | Status: AC | PRN
  Administered 2020-05-01: 5 mL

## 2020-05-01 MED ORDER — APIXABAN 2.5 MG PO TABS
2.5000 mg | ORAL_TABLET | Freq: Two times a day (BID) | ORAL | Status: DC
  Administered 2020-05-04: 2.5 mg via ORAL
  Filled 2020-05-01: qty 1

## 2020-05-01 MED ORDER — HEPARIN SODIUM (PORCINE) 1000 UNIT/ML IJ SOLN
INTRAMUSCULAR | Status: AC | PRN
  Administered 2020-05-01: 2.6 mL via INTRAVENOUS

## 2020-05-01 MED ORDER — LIDOCAINE HCL 1 % IJ SOLN
INTRAMUSCULAR | Status: AC
  Filled 2020-05-01: qty 20

## 2020-05-01 MED ORDER — HEPARIN SODIUM (PORCINE) 1000 UNIT/ML IJ SOLN
INTRAMUSCULAR | Status: AC
  Filled 2020-05-01: qty 1

## 2020-05-01 MED ORDER — HEPARIN SODIUM (PORCINE) 5000 UNIT/ML IJ SOLN
5000.0000 [IU] | Freq: Three times a day (TID) | INTRAMUSCULAR | Status: AC
  Administered 2020-05-01 – 2020-05-02 (×4): 5000 [IU] via SUBCUTANEOUS
  Filled 2020-05-01 (×4): qty 1

## 2020-05-01 NOTE — Progress Notes (Signed)
Orthopaedic Trauma Service Progress Note  Patient ID: Jeremy Johnson MRN: [id number] DOB/AGE: 01/08/1946 74 y.o.  Subjective:  Patient off the floor for procedure  POD 1 R hip hemiarthroplasty    ROS As above Objective:   VITALS:   Vitals:   04/30/20 2004 05/01/20 0010 05/01/20 0558 05/01/20 0907  BP: 138/86 115/74 129/82 119/71  Pulse: 78 61 72 79  Resp: 16 14 16 18   Temp: 97.9 F (36.6 C) 97.8 F (36.6 C) 97.6 F (36.4 C) 98.1 F (36.7 C)  TempSrc: Oral Oral Oral Oral  SpO2: 97% 99% 97% 96%  Weight:      Height:        Estimated body mass index is 22.07 kg/m as calculated from the following:   Height as of this encounter: 5\' 9"  (1.753 m).   Weight as of this encounter: 67.8 kg.   Intake/Output      06/18 0701 - 06/19 0700 06/19 0701 - 06/20 0700   I.V. (mL/kg) 500 (7.4)    Other 150    IV Piggyback 100    Total Intake(mL/kg) 750 (11.1)    Other     Blood 100    Total Output 100    Net +650           LABS  Results for orders placed or performed during the hospital encounter of 04/29/20 (from the past 24 hour(s))  CBC     Status: Abnormal   Collection Time: 05/01/20  2:48 AM  Result Value Ref Range   WBC 6.4 4.0 - 10.5 K/uL   RBC 2.96 (L) 4.22 - 5.81 MIL/uL   Hemoglobin 9.9 (L) 13.0 - 17.0 g/dL   HCT 29.3 (L) 39 - 52 %   MCV 99.0 80.0 - 100.0 fL   MCH 33.4 26.0 - 34.0 pg   MCHC 33.8 30.0 - 36.0 g/dL   RDW 15.7 (H) 11.5 - 15.5 %   Platelets 155 150 - 400 K/uL   nRBC 0.0 0.0 - 0.2 %  Basic metabolic panel     Status: Abnormal   Collection Time: 05/01/20  2:48 AM  Result Value Ref Range   Sodium 133 (L) 135 - 145 mmol/L   Potassium 5.8 (H) 3.5 - 5.1 mmol/L   Chloride 96 (L) 98 - 111 mmol/L   CO2 24 22 - 32 mmol/L   Glucose, Bld 133 (H) 70 - 99 mg/dL   BUN 35 (H) 8 - 23 mg/dL   Creatinine, Ser 8.02 (H) 0.61 - 1.24 mg/dL   Calcium 8.4 (L) 8.9 - 10.3 mg/dL   GFR calc  non Af Amer 6 (L) >60 mL/min   GFR calc Af Amer 7 (L) >60 mL/min   Anion gap 13 5 - 15     PHYSICAL EXAM:   Off the floor  Procedure to temporary dialysis cath   Assessment/Plan: 1 Day Post-Op   Active Problems:   Essential hypertension   Hyperlipidemia   Small cell lung cancer (HCC)   Anemia in chronic kidney disease (CKD)   ESRD (end stage renal disease) on dialysis (HCC)   Hypothyroidism, unspecified   Closed displaced fracture of right femoral neck with delayed healing   Closed displaced fracture of right femoral neck (HCC)   Anti-infectives (From admission, onward)   Start  Dose/Rate Route Frequency Ordered Stop   05/01/20 1200  ceFAZolin (ANCEF) IVPB 1 g/50 mL premix     Discontinue    Note to Pharmacy: Tolerated intra-op ancef   1 g 100 mL/hr over 30 Minutes Intravenous Every 24 hours 04/30/20 1546 05/03/20 1159   04/30/20 0800  ceFAZolin (ANCEF) IVPB 2g/100 mL premix        2 g 200 mL/hr over 30 Minutes Intravenous To [facility] 04/29/20 1644 04/30/20 1238    .  POD/HD#: 1  74 y/o male s/p fall with R femoral neck fracture  - Right femoral neck fracture s/p R hip hemiarthroplasty   WBAT with assistance  Posterior hip precautions  PT/OT  Ice PRN  Dressing change Monday   - Pain management:  Minimize narcotics  Scheduled tylenol   norco prn   - ABL anemia/Hemodynamics  Monitor   - Medical issues   Per primary   - DVT/PE prophylaxis:  Currently on heparin  Transition to eliquis x 35 days due to ESRD on dialysis   - ID:   periop abx   - Metabolic Bone Disease:  Meets criteria for fracture liaison consult   Order placed  Fracture/mechanism suggestive of osteoporosis   Check vitamin d   - Activity:  As above  - Impediments to fracture healing:  ESRD on dialysis   History of lung CA    - Dispo:  Therapy evals   Ortho issues stable  Will continue to follow    Weightbearing: WBAT RLE Insicional and dressing care: Daily  dressing changes with mepilex starting on 05/03/2020 Orthopedic device(s): walker Showering: ok to shower and clean wounds with soap and water starting on 05/03/2020. No lotions or ointments on wound  VTE prophylaxis: eliquis 2.5mg  BID x 35 days  Pain control: tylenol, norco Bone Health/Optimization:  Follow up vitamin d levels  Follow - up plan: 2 weeks Contact information:  Altamese Lowry MD, Ainsley Spinner PA-C   Jari Pigg, PA-C [phone] (C) 05/01/2020, 10:17 AM  [facility] [place] [phone] Domingo Sep (F)

## 2020-05-01 NOTE — Plan of Care (Signed)

## 2020-05-01 NOTE — Progress Notes (Signed)
Pt to dialysis.

## 2020-05-01 NOTE — Progress Notes (Signed)
PT Cancellation Note  Patient Details Name: Jeremy Johnson MRN: [id number] DOB: 06/29/1946   Cancelled Treatment:    Reason Eval/Treat Not Completed: Patient at procedure or test/unavailable.  Patient at HD.  Will evaluate tommorow.   Shanna Cisco 05/01/2020, 2:03 PM

## 2020-05-01 NOTE — Progress Notes (Signed)
[facility] Progress Note   Subjective:  Underwent R hip repair yesterday. Pain controlled this am. Denies CP, SOB. Unfortunately, unable to dialyzed this am d/t clotted AVG.   Objective Vitals:   04/30/20 1544 04/30/20 2004 05/01/20 0010 05/01/20 0558  BP: 137/77 138/86 115/74 129/82  Pulse: 68 78 61 72  Resp: 17 16 14 16   Temp: 97.8 F (36.6 C) 97.9 F (36.6 C) 97.8 F (36.6 C) 97.6 F (36.4 C)  TempSrc: Oral Oral Oral Oral  SpO2: 94% 97% 99% 97%  Weight:      Height:         Additional Objective Labs: Basic Metabolic Panel: Recent Labs  Lab 04/29/20 0712 04/30/20 0515 05/01/20 0248  NA 135 134* 133*  K 4.6 4.6 5.8*  CL 94* 94* 96*  CO2 27 30 24   GLUCOSE 135* 75 133*  BUN 31* 17 35*  CREATININE 8.36* 5.93* 8.02*  CALCIUM 9.0 8.8* 8.4*  PHOS  --  4.1  --    CBC: Recent Labs  Lab 04/29/20 0712 04/30/20 0515 05/01/20 0248  WBC 7.9 5.8 6.4  NEUTROABS 5.7  --   --   HGB 10.3* 10.8* 9.9*  HCT 31.0* 31.8* 29.3*  MCV 100.0 99.1 99.0  PLT 183 168 155   Blood Culture    Component Value Date/Time   SDES BLOOD LEFT ARM 05/22/2019 1559   SDES BLOOD LEFT ARM 05/22/2019 1559   SPECREQUEST  05/22/2019 1559    BOTTLES DRAWN AEROBIC AND ANAEROBIC Blood Culture results may not be optimal due to an inadequate volume of blood received in culture bottles   SPECREQUEST  05/22/2019 1559    BOTTLES DRAWN AEROBIC AND ANAEROBIC Blood Culture adequate volume   CULT  05/22/2019 1559    NO GROWTH 5 DAYS Performed at [facility], [place] [address], [place], [place] [address]    CULT  05/22/2019 1559    NO GROWTH 5 DAYS Performed at [facility], [place] [address] [place],  [address]    REPTSTATUS 05/27/2019 FINAL 05/22/2019 1559   REPTSTATUS 05/27/2019 FINAL 05/22/2019 1559     Physical Exam General: Elderly male, nad  Heart: RRR Lungs: Clear bilaterally  Abdomen: soft, non-tender Extremities: no LE edema  Dialysis Access: LUE AVG ,  no bruit this am   Medications: . sodium chloride    . sodium chloride    .  ceFAZolin (ANCEF) IV     . acetaminophen  500 mg Oral Q8H  . allopurinol  100 mg Oral q AM  . amLODipine  10 mg Oral QHS  . apixaban  2.5 mg Oral BID  . cetirizine  5 mg Oral QPM  . Chlorhexidine Gluconate Cloth  6 each Topical Q0600  . cloNIDine  0.1 mg Oral BID  . docusate sodium  100 mg Oral BID  . feeding supplement (NEPRO CARB STEADY)  237 mL Oral BID BM  . levothyroxine  175 mcg Oral QAC breakfast  . metoprolol tartrate  25 mg Oral BID  . mometasone-formoterol  2 puff Inhalation BID  . multivitamin  1 tablet Oral q AM  . pantoprazole  80 mg Oral Daily  . pramipexole  0.25 mg Oral QHS  . rosuvastatin  10 mg Oral QHS    OP HD: TTS [place]   4h  400/A1.5  72kg   3K/2.5 bath  AVG   ? heparin  mircera 50 mcg every 4 weeks - last on 04/20/20   venofer  50 mg weekly  Calcitriol 0.75 mcg three times a week with HD   Assessment/Plan: 1. Right hip fx s/p mechanical fall. S/p R  hemiarthroplasty 6/18. Avoid morphine in ESRD patient.  2. ESRD - HD TTS. K+ 5.8. Unable to dialyze today d/t clotted graft. Will consult IR for declot. Have made patient NPO. Will dose Lokelma today.  3. HTN/volume-  BP controlled. 2.1L UF on HD 6/17. On supp O2 here - OP EDW just lowered --UF as tolerated to optimize volume status.  4. Anemia-  Hb 9.9. Not on ESA as outpatient.  Follow.  5. MBD of CKD -  Ca/Phos at goal. Continue binders/calcitriol  6. Nutrition - Renal diet/prot supp when eating   Lynnda Child PA-C [facility] 05/01/2020,9:02 AM

## 2020-05-01 NOTE — Progress Notes (Signed)
[place] for Apixaban Indication: VTE prophylaxis post-hip surgery  Allergies  Allergen Reactions  . Advair Hfa [Fluticasone-Salmeterol] Other (See Comments)    Developed thrush, although the mouth WAS being rinsed as directed  . Penicillins Rash    Has patient had a PCN reaction causing immediate rash, facial/tongue/throat swelling, SOB or lightheadedness with hypotension: No Has patient had a PCN reaction causing severe rash involving mucus membranes or skin necrosis: No Has patient had a PCN reaction that required hospitalization: No Has patient had a PCN reaction occurring within the last 10 years: No If all of the above answers are "NO", then may proceed with Cephalosporin use.     Patient Measurements: Height: 5\' 9"  (175.3 cm) Weight: 67.8 kg (149 lb 7.6 oz) IBW/kg (Calculated) : 70.7  Vital Signs: Temp: 98.1 F (36.7 C) (06/19 0907) Temp Source: Oral (06/19 0907) BP: 119/71 (06/19 0907) Pulse Rate: 79 (06/19 0907)  Labs: Recent Labs    04/29/20 0712 04/29/20 0712 04/30/20 0515 05/01/20 0248  HGB 10.3*   < > 10.8* 9.9*  HCT 31.0*  --  31.8* 29.3*  PLT 183  --  168 155  LABPROT 13.5  --   --   --   INR 1.1  --   --   --   CREATININE 8.36*  --  5.93* 8.02*   < > = values in this interval not displayed.    Estimated Creatinine Clearance: 7.9 mL/min (A) (by C-G formula based on SCr of 8.02 mg/dL (H)).   Medical History: Past Medical History:  Diagnosis Date  . Anemia   . Blood transfusion without reported diagnosis   . CAD (coronary artery disease)    STENT... MID CIRCUMFLEX...1997  . Chronic kidney disease    STAGE 3  . COPD (chronic obstructive pulmonary disease) ([place])   . Degenerative joint disease (DJD) of lumbar spine   . GERD (gastroesophageal reflux disease)   . Gout   . Hyperlipidemia   . Hypertension   . Hypothyroidism   . Incisional hernia    abdomen  . Leukocytosis    CHRONIC MILD  . Myocardial  infarction ([place])    1997  . SCL CA dx'd 01/2019   Lung cancer    Assessment: 74 yr old male with ESRD (TTS HD), CAD, COPD, HTN, HLD, hx of lung cancer, hypothyroidism presented after fall on the morning of 04/29/20 and was diagnosed with R femoral neck fx; pt is S/P R hip hemiarthroplasty on 6/18. Pharmacy is consulted for apixaban for VTE prophylaxis post-ortho surgery (also has hx of lung cancer, which increases risk for thrombosis).  H/H stable at 9.9/29.3, platelets stable at 155. INR 1.1 on 04/29/20.  Pt found to have clotted AVG on 6/19. IR will place [facility] today and plan to perform thrombectomy on Monday 6/21. Spoke to on-call ortho MD Dr. Griffin Basil, will delay starting apixaban and bridge with sq heparin for now.  Goal of Therapy:  Prevention of DVT post-hip surgery (also with hx of lung cancer) Monitor platelets by anticoagulation protocol: Yes   Plan:  Resume heparin subcutaneous injections 5000 unit q8h until 6/21 Will hold apixaban while undergoing IR procedures Start apixaban 2.5 mg BID on 6/22, continuing for 35 days Monitor CBC Monitor for signs/symptoms of bleeding  Berenice Bouton, PharmD PGY1 Pharmacy Resident  Please check AMION for all [place] phone numbers After 10:00 PM, call [place] [phone]  05/01/2020,10:35 AM

## 2020-05-01 NOTE — Procedures (Signed)
Interventional Radiology Procedure Note  Procedure: Placement of a right IJ approach triple lumen HD catheter16cm, temp. Tip is positioned at the superior cavoatrial junction and catheter is ready for immediate use.  Complications: None Recommendations:  - Ok to shower tomorrow - Do not submerge - Routine line care  - ok to convert if needed  Signed,  Dulcy Fanny. Earleen Newport, DO

## 2020-05-01 NOTE — Progress Notes (Signed)
Pt noted in dialysis, unable to cannulate graft, attempt x3. Negative thrill and bruit. Dr. Jonnie Finner and primary nurse notified. Pt to be transferred back to his room.

## 2020-05-01 NOTE — Progress Notes (Signed)
PROGRESS NOTE    Jeremy Johnson  IRW:[id number] DOB: [birth year] DOA: 04/29/2020 PCP: [name], MD    Brief Narrative:  74 y.o. male with medical history of ESRD (T[facility]T-S) at [facility], coronary artery disease, COPD, hypertension, hyperlipidemia, small cell lung cancer, hypothyroidism presenting after mechanical fall while getting dressed to go to dialysis in the early a.m. 04/29/2020.  The patient denies any syncope.  The patient had been in his usual state of health until his mechanical fall.  He had denied any fevers, chills, headache, chest pain, shortness breath, coughing, hemoptysis, nausea, vomiting, diarrhea, abdominal pain, hematochezia, melena, dizziness.  The patient has a remote history of MI back in 1997 but has not seen a cardiologist for over 10 years after he was released by Dr. Lia Foyer for being medically stable.  The patient denies any history of stroke.  He is compliant with all his medications.  He was last dialyzed on 04/27/2020 and had a full dialysis session. In the emergency department, the patient was afebrile hemodynamically stable with oxygen saturation 97% on room air.  WBC 7.9, hemoglobin 10.3, platelets 183,000.  Potassium 4.6, sodium 135, serum creatinine 8.36.  Nephrology and orthopedics were consulted to assist with management.  Assessment & Plan:   Active Problems:   Essential hypertension   Hyperlipidemia   Small cell lung cancer (HCC)   Anemia in chronic kidney disease (CKD)   ESRD (end stage renal disease) on dialysis (HCC)   Hypothyroidism, unspecified   Closed displaced fracture of right femoral neck with delayed healing   Closed displaced fracture of right femoral neck ([place])  Right femoral neck fracture -Orthopedics consulted and pt now s/p R hemiarthoplasty 6/18 -Cont with post-op care per Orthopedic Surgey  ESRD -Dialyzes Tuesday, Thursday, Saturday -Nephrology following -HD graft clotted this AM and was unable to undergo HD. Now s/p temp HD  cath placement and currently undergoing HD  Coronary artery disease -No chest pain presently -Aspirin was held perioperatively  in anticipation for surgery -Stable at this time. Now on eliquis for post-op prophylaxis per Ortho. Once completing course of eliquis, would resume ASA  Non-small cell lung cancer -Patient follows Dr. Earlie Server -Status post 4 cycles carboplatin and etoposide -Currently under observation -Most recent CT reportedly did not show any recurrence  Essential hypertension -Continued on metoprolol tartrate, amlodipine and clonidine  COPD/pulmonary fibrosis -Stable on room air currently -Continue Advair home dose  Hypothyroidism -Continue with Synthroid  Hyperlipidemia -Continue Crestor as pt tolerates  Restless leg syndrome -Continue pramipexole  GERD -Continue PPI  DVT prophylaxis: eliquis Code Status: Full Family Communication: Pt in room, family is currently not at bedside  Status is: Inpatient  Remains inpatient appropriate because:Unsafe d/c plan and Inpatient level of care appropriate due to severity of illness   Dispo: The patient is from: Home              Anticipated d/c is to: pending PT eval              Anticipated d/c date is: 2 days              Patient currently is not medically stable to d/c.    Consultants:   Orthopedic Surgery  Nephrology  IR  Procedures:   R hemiarthoplasty 6/18  Temp HD cath placement 6/19  Antimicrobials: Anti-infectives (From admission, onward)   Start     Dose/Rate Route Frequency Ordered Stop   05/01/20 1200  ceFAZolin (ANCEF) IVPB 1 g/50 mL premix  Discontinue    Note to Pharmacy: Tolerated intra-op ancef   1 g 100 mL/hr over 30 Minutes Intravenous Every 24 hours 04/30/20 1546 05/03/20 1159   04/30/20 0800  ceFAZolin (ANCEF) IVPB 2g/100 mL premix        2 g 200 mL/hr over 30 Minutes Intravenous To [facility] 04/29/20 1644 04/30/20 1238      Subjective: Without  complaints this AM. Seen prior to temp HD cath placement and was eager to have food since being NPO  Objective: Vitals:   05/01/20 1350 05/01/20 1401 05/01/20 1430 05/01/20 1500  BP: 110/70 123/72 116/72 127/76  Pulse: 84 80 84 82  Resp: _0 Temp:  98.5 F (36.9 C)    TempSrc:  Oral    SpO2:  96%    Weight:  72 kg    Height:       No intake or output data in the 24 hours ending 05/01/20 1542 Filed Weights   04/29/20 1459 04/30/20 1058 05/01/20 1401  Weight: 67.8 kg 67.8 kg 72 kg    Examination: General exam: Awake, laying in bed, in nad Respiratory system: Normal respiratory effort, no wheezing Cardiovascular system: regular rate, s1, s2 Gastrointestinal system: Soft, nondistended, positive BS Central nervous system: CN2-12 grossly intact, strength intact Extremities: Perfused, no clubbing Skin: Normal skin turgor, no notable skin lesions seen Psychiatry: Mood normal // no visual hallucinations   Data Reviewed: I have personally reviewed following labs and imaging studies  CBC: Recent Labs  Lab 04/29/20 0712 04/30/20 0515 05/01/20 0248  WBC 7.9 5.8 6.4  NEUTROABS 5.7  --   --   HGB 10.3* 10.8* 9.9*  HCT 31.0* 31.8* 29.3*  MCV 100.0 99.1 99.0  PLT 183 168 312   Basic Metabolic Panel: Recent Labs  Lab 04/29/20 0712 04/30/20 0515 05/01/20 0248  NA 135 134* 133*  K 4.6 4.6 5.8*  CL 94* 94* 96*  CO2 _1 GLUCOSE 135* 75 133*  BUN 31* 17 35*  CREATININE 8.36* 5.93* 8.02*  CALCIUM 9.0 8.8* 8.4*  PHOS  --  4.1  --    GFR: Estimated Creatinine Clearance: 8.2 mL/min (A) (by C-G formula based on SCr of 8.02 mg/dL (H)). Liver Function Tests: Recent Labs  Lab 04/30/20 0515  ALBUMIN 3.1*   No results for input(s): LIPASE, AMYLASE in the last 168 hours. No results for input(s): AMMONIA in the last 168 hours. Coagulation Profile: Recent Labs  Lab 04/29/20 0712  INR 1.1   Cardiac Enzymes: No results for input(s): CKTOTAL, CKMB, CKMBINDEX,  TROPONINI in the last 168 hours. BNP (last 3 results) No results for input(s): PROBNP in the last 8760 hours. HbA1C: No results for input(s): HGBA1C in the last 72 hours. CBG: No results for input(s): GLUCAP in the last 168 hours. Lipid Profile: No results for input(s): CHOL, HDL, LDLCALC, TRIG, CHOLHDL, LDLDIRECT in the last 72 hours. Thyroid Function Tests: No results for input(s): TSH, T4TOTAL, FREET4, T3FREE, THYROIDAB in the last 72 hours. Anemia Panel: No results for input(s): VITAMINB12, FOLATE, FERRITIN, TIBC, IRON, RETICCTPCT in the last 72 hours. Sepsis Labs: No results for input(s): PROCALCITON, LATICACIDVEN in the last 168 hours.  Recent Results (from the past 240 hour(s))  SARS Coronavirus 2 by RT PCR (hospital order, performed in [facility] hospital lab) Nasopharyngeal Nasopharyngeal Swab     Status: None   Collection Time: 04/29/20  8:35 AM   Specimen: Nasopharyngeal Swab  Result Value Ref Range Status  SARS Coronavirus 2 NEGATIVE NEGATIVE Final    Comment: (NOTE) SARS-CoV-2 target nucleic acids are NOT DETECTED.  The SARS-CoV-2 RNA is generally detectable in upper and lower respiratory specimens during the acute phase of infection. The lowest concentration of SARS-CoV-2 viral copies this assay can detect is 250 copies / mL. A negative result does not preclude SARS-CoV-2 infection and should not be used as the sole basis for treatment or other patient management decisions.  A negative result may occur with improper specimen collection / handling, submission of specimen other than nasopharyngeal swab, presence of viral mutation(s) within the areas targeted by this assay, and inadequate number of viral copies (<250 copies / mL). A negative result must be combined with clinical observations, patient history, and epidemiological information.  Fact Sheet for Patients:   StrictlyIdeas.no  Fact Sheet for Healthcare  Providers: BankingDealers.co.za  This test is not yet approved or  cleared by the [place] FDA and has been authorized for detection and/or diagnosis of SARS-CoV-2 by FDA under an Emergency Use Authorization (EUA).  This EUA will remain in effect (meaning this test can be used) for the duration of the COVID-19 declaration under Section 564(b)(1) of the Act, 21 U.S.C. section 360bbb-3(b)(1), unless the authorization is terminated or revoked sooner.  Performed at [facility], [address], [place], [place] [address]   MRSA PCR Screening     Status: None   Collection Time: 04/29/20  5:31 PM   Specimen: Nasal Mucosa; Nasopharyngeal  Result Value Ref Range Status   MRSA by PCR NEGATIVE NEGATIVE Final    Comment:        The GeneXpert MRSA Assay (FDA approved for NASAL specimens only), is one component of a comprehensive MRSA colonization surveillance program. It is not intended to diagnose MRSA infection nor to guide or monitor treatment for MRSA infections. Performed at [facility], [place] [address] [place], [place] [address]      Radiology Studies: IR US Guide Vasc Access Right  Result Date: 05/01/2020 INDICATION: End-stage renal disease EXAM: IMAGE GUIDED TEMPORARY HEMODIALYSIS CATHETER MEDICATIONS: None ANESTHESIA/SEDATION: None FLUOROSCOPY TIME:  Fluoroscopy Time: 0 minutes 12 seconds COMPLICATIONS: None PROCEDURE: Informed written consent was obtained from the patient's family after a discussion of the risks, benefits, and alternatives to treatment. Questions regarding the procedure were encouraged and answered. The right neck was prepped with chlorhexidine in a sterile fashion, and a sterile drape was applied covering the operative field. Maximum barrier sterile technique with sterile gowns and gloves were used for the procedure. A timeout was performed prior to the initiation of the procedure. A micropuncture kit was utilized to access the right  internal jugular vein under direct, real-time ultrasound guidance after the overlying soft tissues were anesthetized with 1% lidocaine with epinephrine. Ultrasound image documentation was performed. The microwire was kinked to measure appropriate catheter length. A stiff glidewire was advanced to the level of the IVC. A 16 cm hemodialysis catheter was then placed over the wire. Final catheter positioning was confirmed and documented with a spot radiographic image. The catheter aspirates and flushes normally. The catheter was flushed with appropriate volume heparin dwells. Dressings were applied. The patient tolerated the procedure well without immediate post procedural complication. IMPRESSION: Status post image guided temporary hemodialysis catheter placement. Signed, Dulcy Fanny. Dellia Nims, RPVI [facility] Radiology Electronically Signed   By: Corrie Mckusick D.O.   On: 05/01/2020 13:56   DG Hip Port Unilat With Pelvis 1V Right  Result Date: 04/30/2020 CLINICAL DATA:  Right femoral neck fracture. Status post hemiarthroplasty. EXAM: DG HIP (WITH OR WITHOUT PELVIS) 1V PORT RIGHT COMPARISON:  Radiographs dated 04/29/2020 FINDINGS: The proximal femoral prosthesis appears in excellent position. No acute bone abnormality. IMPRESSION: Satisfactory appearance of the right hip after insertion of proximal femoral prosthesis. Electronically Signed   By: Lorriane Shire M.D.   On: 04/30/2020 15:48    Scheduled Meds: . acetaminophen  500 mg Oral Q8H  . allopurinol  100 mg Oral q AM  . amLODipine  10 mg Oral QHS  . [START ON 05/04/2020] apixaban  2.5 mg Oral BID  . cetirizine  5 mg Oral QPM  . cloNIDine  0.1 mg Oral BID  . docusate sodium  100 mg Oral BID  . feeding supplement (NEPRO CARB STEADY)  237 mL Oral BID BM  . heparin injection (subcutaneous)  5,000 Units Subcutaneous Q8H  . heparin sodium (porcine)      . heparin sodium (porcine)      . levothyroxine  175  mcg Oral QAC breakfast  . lidocaine      . metoprolol tartrate  25 mg Oral BID  . mometasone-formoterol  2 puff Inhalation BID  . multivitamin  1 tablet Oral q AM  . pantoprazole  80 mg Oral Daily  . pramipexole  0.25 mg Oral QHS  . rosuvastatin  10 mg Oral QHS  . sodium zirconium cyclosilicate  10 g Oral Once   Continuous Infusions: .  ceFAZolin (ANCEF) IV       LOS: 2 days   Marylu Lund, MD [facility] Pager On Amion  If 7PM-7AM, please contact night-coverage 05/01/2020, 3:42 PM

## 2020-05-01 NOTE — H&P (Signed)
Chief Complaint: Clotted graft  Referring Physician(s): Lynnda Child, PA-C  Supervising Physician: Corrie Mckusick  Patient Status: [facility] - In-pt  History of Present Illness: Jeremy Johnson is a 74 y.o. male with end stage renal disease on hemodialysis via left upper arm AV graft.  He was admitted for workup after a fall at home on 04/29/20.  The nurses were unable to access his left arm graft today secondary to thrombosis.  He is known to our service. He had a tunneled catheter placed by Dr. Annamaria Boots 04/22/2019.  Past Medical History:  Diagnosis Date  . Anemia   . Blood transfusion without reported diagnosis   . CAD (coronary artery disease)    STENT... MID CIRCUMFLEX...1997  . Chronic kidney disease    STAGE 3  . COPD (chronic obstructive pulmonary disease) ([place])   . Degenerative joint disease (DJD) of lumbar spine   . GERD (gastroesophageal reflux disease)   . Gout   . Hyperlipidemia   . Hypertension   . Hypothyroidism   . Incisional hernia    abdomen  . Leukocytosis    CHRONIC MILD  . Myocardial infarction ([place])    1997  . SCL CA dx'd 01/2019   Lung cancer    Past Surgical History:  Procedure Laterality Date  . ABDOMINAL AORTIC ANEURYSM REPAIR  2006  . AV FISTULA PLACEMENT Left 04/25/2019   Procedure: ARTERIOVENOUS (AV) FISTULA CREATION LEFT ARM;  Surgeon: Angelia Mould, MD;  Location: [place];  Service: Vascular;  Laterality: Left;  . AV FISTULA PLACEMENT Left 05/19/2019   Procedure: CONVERSION OF LEFT ARM ARTERIOVENOUS FISTULA TO GRAFT;  Surgeon: Angelia Mould, MD;  Location: [place];  Service: Vascular;  Laterality: Left;  . BIOPSY  09/30/2018   Procedure: BIOPSY;  Surgeon: Danie Binder, MD;  [facility];  Service: Endoscopy;;  ascending colon  . COLONOSCOPY  2008  . COLONOSCOPY N/A 09/30/2018   Procedure: COLONOSCOPY;  Surgeon: Danie Binder, MD;  [facility];  Service: Endoscopy;  Laterality: N/A;  9:00  .  CORONARY ANGIOPLASTY WITH STENT [place]  . IR FLUORO GUIDE CV LINE RIGHT  04/22/2019  . IR US GUIDE VASC ACCESS RIGHT  04/22/2019  . POLYPECTOMY  09/30/2018   Procedure: POLYPECTOMY;  Surgeon: Danie Binder, MD;  [facility];  Service: Endoscopy;;  colon  . VIDEO BRONCHOSCOPY WITH ENDOBRONCHIAL NAVIGATION N/A 01/27/2019   Procedure: VIDEO BRONCHOSCOPY WITH ENDOBRONCHIAL NAVIGATION;  Surgeon: Grace Isaac, MD;  Location: [place];  Service: Thoracic;  Laterality: N/A;  . VIDEO BRONCHOSCOPY WITH ENDOBRONCHIAL ULTRASOUND N/A 01/27/2019   Procedure: VIDEO BRONCHOSCOPY WITH ENDOBRONCHIAL ULTRASOUND;  Surgeon: Grace Isaac, MD;  [facility];  Service: Thoracic;  Laterality: N/A;    Allergies: Advair hfa [fluticasone-salmeterol] and Penicillins  Medications: Prior to Admission medications   Medication Sig Start Date End Date Taking? Authorizing Provider  acetaminophen (TYLENOL) 325 MG tablet Take 2 tablets (650 mg total) by mouth every 6 (six) hours as needed for mild pain (or Fever >/= 101). 05/25/19  Yes Swayze, Ava, DO  albuterol (VENTOLIN HFA) 108 (90 Base) MCG/ACT inhaler Inhale 2 puffs into the lungs every 6 (six) hours as needed for wheezing or shortness of breath.  02/02/20  Yes [provider]  allopurinol (ZYLOPRIM) 100 MG tablet Take 1 tablet (100 mg total) by mouth daily. Patient taking differently: Take 100 mg by mouth in the  morning.  04/26/19  Yes Mercy Riding, MD  amLODipine (NORVASC) 10 MG tablet Take 10 mg by mouth at bedtime.  08/16/19  Yes [provider]  aspirin EC 81 MG tablet Take 81 mg by mouth at bedtime.   Yes [provider]  B Complex-C-Zn-Folic Acid (DIALYVITE 416-SAYT 15) 0.8 MG TABS Take 1 tablet by mouth daily. 04/22/20  Yes [provider]  cloNIDine (CATAPRES) 0.1 MG tablet Take 0.1 mg by mouth in the morning and at bedtime.    Yes [provider]  levothyroxine (SYNTHROID,  LEVOTHROID) 175 MCG tablet Take 175 mcg by mouth daily before breakfast.    Yes [provider]  lidocaine-prilocaine (EMLA) cream Apply 1 application topically Every Tuesday,Thursday,and Saturday with dialysis.  06/24/19  Yes [provider]  metoprolol tartrate (LOPRESSOR) 50 MG tablet Take 25 mg by mouth 2 (two) times daily.    Yes [provider]  omeprazole (PRILOSEC) 40 MG capsule Take 40 mg by mouth in the morning.  08/15/19  Yes [provider]  pramipexole (MIRAPEX) 0.25 MG tablet Take 0.25 mg by mouth at bedtime. 03/31/20  Yes [provider]  rosuvastatin (CRESTOR) 10 MG tablet Take 10 mg by mouth at bedtime. 01/30/20  Yes [provider]  Darbepoetin Alfa (ARANESP) 100 MCG/0.5ML SOSY injection Inject 0.5 mLs (100 mcg total) into the vein every Thursday with hemodialysis. 05/01/19   Mercy Riding, MD  Fluticasone-Salmeterol (ADVAIR DISKUS) 250-50 MCG/DOSE AEPB Inhale 1 puff into the lungs 2 (two) times daily. Patient not taking: Reported on 04/29/2020 04/06/20 04/06/21  Roxan Hockey, MD  iron sucrose in sodium chloride 0.9 % 100 mL 50 mg. 04/06/20 04/05/21  [provider]  Methoxy PEG-Epoetin Beta (MIRCERA IJ) Inject 30 mg into the vein every 28 (twenty-eight) days.  03/23/20 03/22/21  [provider]  polyethylene glycol (MIRALAX / GLYCOLAX) 17 g packet Take 17 g by mouth daily. Patient not taking: Reported on 04/29/2020 05/26/19   Swayze, Ava, DO  predniSONE (DELTASONE) 20 MG tablet Take 2 tablets (40 mg total) by mouth daily with breakfast. Patient not taking: Reported on 04/29/2020 04/06/20   Roxan Hockey, MD     Family History  Problem Relation Age of Onset  . Stroke Brother   . Lung cancer Sister 21       lung cancer/former    Social History   Socioeconomic History  . Marital status: Married    Spouse name: Not on file  . Number of children: 2  . Years of education: Not on file  . Highest education level:  Not on file  Occupational History    Comment: retired  Tobacco Use  . Smoking status: Former Smoker    Packs/day: 0.50    Years: 54.00    Pack years: 27.00    Quit date: 09/17/2017    Years since quitting: 2.6  . Smokeless tobacco: Never Used  Vaping Use  . Vaping Use: Never used  Substance and Sexual Activity  . Alcohol use: Yes    Comment: occasional  . Drug use: No  . Sexual activity: Not Currently  Other Topics Concern  . Not on file  Social History Narrative  . Not on file   Social Determinants of Health   Financial Resource Strain:   . Difficulty of Paying Living Expenses:   Food Insecurity:   . Worried About Charity fundraiser in the Last Year:   . [place] in the Last  Year:   Transportation Needs:   . Film/video editor (Medical):   [name] Lack of Transportation (Non-Medical):   Physical Activity:   . Days of Exercise per Week:   . Minutes of Exercise per Session:   Stress:   . Feeling of Stress :   Social Connections:   . Frequency of Communication with Friends and Family:   . Frequency of Social Gatherings with Friends and Family:   . Attends Religious Services:   . Active Member of Clubs or Organizations:   . Attends Archivist Meetings:   [name] Marital Status:      Review of Systems: A 12 point ROS discussed and pertinent positives are indicated in the HPI above.  All other systems are negative.  Review of Systems  Vital Signs: BP 119/71 (BP Location: Right Arm)   Pulse 79   Temp 98.1 F (36.7 C) (Oral)   Resp 18   Ht 5\' 9"  (1.753 m)   Wt 67.8 kg   SpO2 96%   BMI 22.07 kg/m   Physical Exam Constitutional:      Appearance: Normal appearance.  HENT:     Head: Normocephalic and atraumatic.  Cardiovascular:     Rate and Rhythm: Normal rate.  Pulmonary:     Effort: Pulmonary effort is normal. No respiratory distress.  Abdominal:     Palpations: Abdomen is soft.     Tenderness: There is no abdominal tenderness.    Musculoskeletal:        General: Normal range of motion.       Arms:     Comments: Left upper arm AV graft. No bruit.  Skin:    General: Skin is warm and dry.  Neurological:     General: No focal deficit present.     Mental Status: He is alert and oriented to person, place, and time.  Psychiatric:        Mood and Affect: Mood normal.        Behavior: Behavior normal.        Thought Content: Thought content normal.        Judgment: Judgment normal.     Imaging: DG Chest 1 View  Result Date: 04/29/2020 CLINICAL DATA:  Fall with right hip pain EXAM: CHEST  1 VIEW COMPARISON:  04/06/2020 FINDINGS: Mild cardiomegaly. Widened upper mediastinum with known thoracic aortic aneurysm. Infiltrate around the left hilum with volume loss correlating with history of lung cancer and radiotherapy. Bilateral interstitial prominence that is mildly asymmetric to the right. No visible effusion or pneumothorax. IMPRESSION: 1. Interstitial prominence, possible mild edema. 2. Post treatment left hilum. 3. Known thoracic aortic aneurysm. Electronically Signed   By: Monte Fantasia M.D.   On: 04/29/2020 07:31   DG Chest 2 View  Result Date: 04/05/2020 CLINICAL DATA:  Shortness of breath EXAM: CHEST - 2 VIEW COMPARISON:  Chest CT January 16, 2020; chest radiograph May 22, 2019 FINDINGS: There is fibrosis throughout the lungs bilaterally with patchy airspace opacity in the left upper lobe an each lung base. Apparent radiation therapy change left perihilar region. Heart is upper normal in size with pulmonary vascularity normal. No adenopathy. There is aortic atherosclerosis. Aneurysmal dilatation in the aortic arch region is better seen on recent CT. No adenopathy. No bone lesions. IMPRESSION: Underlying fibrosis. Radiation therapy fibrosis noted in the left perihilar region. Patchy airspace opacity in the right base laterally, concerning for focal pneumonia. Airspace opacity in the left upper lobe and left base are  similar to March 2021 study and may represent combination of scarring and residual infiltrate. Concern for a degree of multifocal pneumonia with superimposed scarring and fibrosis. Stable cardiac silhouette. No adenopathy appreciable. Aortic Atherosclerosis (ICD10-I70.0). Electronically Signed   By: Lowella Grip III M.D.   On: 04/05/2020 13:06   CT Angio Chest PE W and/or Wo Contrast  Result Date: 04/05/2020 CLINICAL DATA:  Intermittent shortness of breath for 1 month, worsening over last several days, orthopnea, hypoxia, history of non-small cell lung cancer status post chemotherapy and radiation therapy EXAM: CT ANGIOGRAPHY CHEST WITH CONTRAST TECHNIQUE: Multidetector CT imaging of the chest was performed using the standard protocol during bolus administration of intravenous contrast. Multiplanar CT image reconstructions and MIPs were obtained to evaluate the vascular anatomy. CONTRAST:  167mL OMNIPAQUE IOHEXOL 350 MG/ML SOLN COMPARISON:  01/16/2020, 12/05/2019 FINDINGS: Cardiovascular: This is a technically adequate evaluation of the pulmonary vasculature. No filling defects or pulmonary emboli. The heart is enlarged, with prominent left ventricular dilatation. No pericardial effusion. Focal aneurysmal dilatation of the proximal aortic arch again noted, measuring up to 4.6 cm in greatest diameter, stable. No evidence of dissection. There is diffuse atherosclerosis of the aorta and coronary vessels. Mediastinum/Nodes: No enlarged mediastinal, hilar, or axillary lymph nodes. Thyroid gland, trachea, and esophagus demonstrate no significant findings. Lungs/Pleura: Upper lobe predominant scarring fibrosis is noted. Likely post radiation change within the left upper lobe, stable. There is superimposed interlobular septal thickening and bilateral airspace disease, along with bilateral pleural effusions. Findings are consistent with congestive heart failure. No pneumothorax. There are areas of compressive  atelectasis within the bilateral lower lobes. The subcentimeter pulmonary nodule seen previously are not well visualized on this exam due to underlying consolidation. Upper Abdomen: No acute abnormality. Musculoskeletal: No acute displaced fractures. Reconstructed images demonstrate no additional findings. Review of the MIP images confirms the above findings. IMPRESSION: 1. No evidence of pulmonary embolus. 2. Findings consistent with congestive heart failure superimposed upon background scarring and fibrosis. 3. Stable 4.6 cm aneurysmal dilatation of the proximal aortic arch. No evidence of dissection. 4. Post therapeutic changes left upper lobe likely related to prior radiation therapy for lung cancer. Stable appearance since prior studies. 5. Aortic Atherosclerosis (ICD10-I70.0) and Emphysema (ICD10-J43.9). Electronically Signed   By: Randa Ngo M.D.   On: 04/05/2020 17:09   Portable chest 1 View  Result Date: 04/06/2020 CLINICAL DATA:  Follow-up pulmonary infiltrate EXAM: PORTABLE CHEST 1 VIEW COMPARISON:  Radiograph 04/05/2020, CT 04/05/2020 FINDINGS: There are some increasing coalescent opacities predominantly through the right mid to lower lung with enlarging bilateral pleural effusions from the comparison radiograph in keeping with the large effusion seen on CT images. There is a background of chronic reticular scarring and severe emphysematous changes better assessed on cross-sectional imaging. Focal aneurysmal dilatation of the thoracic aortic arch is again noted. Remaining cardiomediastinal contours are similar to prior with cardiomegaly. No pneumothorax. Telemetry leads overlie the chest. IMPRESSION: 1. Increasing coalescent and interstitial opacities predominantly through the right mid to lower lung likely reflecting worsening pulmonary edema with enlarging bilateral pleural effusions and passive atelectasis when compared to prior radiograph. 2. Focal aneurysmal dilatation of the thoracic aortic  arch, similar to comparison. 3. Background of chronic architectural distortion, fibrosis and severe emphysema Electronically Signed   By: Lovena Le M.D.   On: 04/06/2020 05:29   DG Hip Port Unilat With Pelvis 1V Right  Result Date: 04/30/2020 CLINICAL DATA:  Right femoral neck fracture. Status post hemiarthroplasty. EXAM: DG HIP (WITH  OR WITHOUT PELVIS) 1V PORT RIGHT COMPARISON:  Radiographs dated 04/29/2020 FINDINGS: The proximal femoral prosthesis appears in excellent position. No acute bone abnormality. IMPRESSION: Satisfactory appearance of the right hip after insertion of proximal femoral prosthesis. Electronically Signed   By: Lorriane Shire M.D.   On: 04/30/2020 15:48   DG Hip Unilat With Pelvis 2-3 Views Right  Result Date: 04/29/2020 CLINICAL DATA:  Fall. EXAM: DG HIP (WITH OR WITHOUT PELVIS) 2-3V RIGHT COMPARISON:  03/09/2016 FINDINGS: Right femoral neck fracture without measurable displacement. Located hip. No significant acetabular changes. Osteopenia. IMPRESSION: Acute right femoral neck fracture. Electronically Signed   By: Monte Fantasia M.D.   On: 04/29/2020 07:32    Labs:  CBC: Recent Labs    04/06/20 0420 04/29/20 0712 04/30/20 0515 05/01/20 0248  WBC 8.3 7.9 5.8 6.4  HGB 11.9* 10.3* 10.8* 9.9*  HCT 36.4* 31.0* 31.8* 29.3*  PLT 167 183 168 155    COAGS: Recent Labs    05/22/19 1604 05/23/19 0746 04/29/20 0712  INR 1.0  --  1.1  APTT 33 33  --     BMP: Recent Labs    04/06/20 0420 04/29/20 0712 04/30/20 0515 05/01/20 0248  NA 134* 135 134* 133*  K 4.8 4.6 4.6 5.8*  CL 97* 94* 94* 96*  CO2 23 27 30 24   GLUCOSE 119* 135* 75 133*  BUN 21 31* 17 35*  CALCIUM 8.8* 9.0 8.8* 8.4*  CREATININE 5.72* 8.36* 5.93* 8.02*  GFRNONAA 9* 6* 9* 6*  GFRAA 10* 7* 10* 7*    LIVER FUNCTION TESTS: Recent Labs    09/01/19 0951 09/01/19 0951 12/05/19 1126 01/16/20 1131 04/06/20 0420 04/30/20 0515  BILITOT 0.3  --  0.5 0.5 1.2  --   AST 20  --  17 19 32  --    ALT 9  --  9 9 25   --   ALKPHOS 80  --  81 80 86  --   PROT 6.8  --  7.6 7.3 7.6  --   ALBUMIN 2.4*   < > 3.4* 3.3* 3.5 3.1*   < > = values in this interval not displayed.    TUMOR MARKERS: No results for input(s): AFPTM, CEA, CA199, CHROMGRNA in the last 8760 hours.  Assessment and Plan:  His K+ is 5.8 and mechanical thrombectomy can increase serum potassium.  Will proceed with temporary catheter today so he can get dialysis today, then plan for thrombectomy on Monday.  Risks and benefits discussed with the patient including, but not limited to bleeding, infection, vascular injury, pneumothorax which may require chest tube placement, air embolism or even death  Risks and benefits discussed with the patient including, but not limited to bleeding, infection, vascular injury, pulmonary embolism, need for tunneled HD catheter placement or even death.  All of the patient's questions were answered, patient is agreeable to proceed. Consent signed and in chart.  Thank you for this interesting consult.  I greatly enjoyed meeting NIGIL BRAMAN and look forward to participating in their care.  A copy of this report was sent to the requesting provider on this date.  Electronically Signed: Murrell Redden, PA-C   05/01/2020, 9:26 AM      I spent a total of    15 Minutes in face to face in clinical consultation, greater than 50% of which was counseling/coordinating care for graft declot and temporary HD cath.

## 2020-05-01 NOTE — Progress Notes (Signed)
OT Cancellation Note  Patient Details Name: Jeremy Johnson MRN: [id number] DOB: November 20, 1945   Cancelled Treatment:    Reason Eval/Treat Not Completed: Patient at procedure or test/ unavailable (Pt OTF at HD. Will follow up as schedule permits.)  Zenovia Jarred, MSOT, OTR/L [place] [facility] Office Number: [phone] Pager: [phone]  Zenovia Jarred 05/01/2020, 4:58 PM

## 2020-05-02 DIAGNOSIS — S72001G Fracture of unspecified part of neck of right femur, subsequent encounter for closed fracture with delayed healing: Secondary | ICD-10-CM

## 2020-05-02 DIAGNOSIS — T82868A Thrombosis of vascular prosthetic devices, implants and grafts, initial encounter: Secondary | ICD-10-CM

## 2020-05-02 LAB — BASIC METABOLIC PANEL
Anion gap: 12 (ref 5–15)
BUN: 25 mg/dL — ABNORMAL HIGH (ref 8–23)
CO2: 25 mmol/L (ref 22–32)
Calcium: 8.6 mg/dL — ABNORMAL LOW (ref 8.9–10.3)
Chloride: 99 mmol/L (ref 98–111)
Creatinine, Ser: 5.94 mg/dL — ABNORMAL HIGH (ref 0.61–1.24)
GFR calc Af Amer: 10 mL/min — ABNORMAL LOW (ref >60)
GFR calc non Af Amer: 9 mL/min — ABNORMAL LOW (ref >60)
Glucose, Bld: 145 mg/dL — ABNORMAL HIGH (ref 70–99)
Potassium: 4.3 mmol/L (ref 3.5–5.1)
Sodium: 136 mmol/L (ref 135–145)

## 2020-05-02 LAB — CBC
HCT: 29 % — ABNORMAL LOW (ref 39.0–52.0)
Hemoglobin: 9.6 g/dL — ABNORMAL LOW (ref 13.0–17.0)
MCH: 33.2 pg (ref 26.0–34.0)
MCHC: 33.1 g/dL (ref 30.0–36.0)
MCV: 100.3 fL — ABNORMAL HIGH (ref 80.0–100.0)
Platelets: 185 10*3/uL (ref 150–400)
RBC: 2.89 MIL/uL — ABNORMAL LOW (ref 4.22–5.81)
RDW: 15.9 % — ABNORMAL HIGH (ref 11.5–15.5)
WBC: 8 10*3/uL (ref 4.0–10.5)
nRBC: 0 % (ref 0.0–0.2)

## 2020-05-02 MED ORDER — CHLORHEXIDINE GLUCONATE CLOTH 2 % EX PADS
6.0000 | MEDICATED_PAD | Freq: Every day | CUTANEOUS | Status: DC
  Administered 2020-05-02 – 2020-05-04 (×3): 6 via TOPICAL

## 2020-05-02 NOTE — Evaluation (Signed)
Physical Therapy Evaluation Patient Details Name: Jeremy Johnson MRN: [id number] DOB: 1946/08/01 Today's Date: 05/02/2020   History of Present Illness  Pt is a 74 y.o. M with significant PMH of ESRD, CAD, COPD, hypertension, small cell  lung CA who presents after a mechanical fall that resulted in a right femoral neck fracture. Now s/p R hemiarthroplasty 6/18. HD graft clotted 6/19, requiring temporary HD cath placement.   Clinical Impression  Prior to admission, pt lives with his wife and is independent with ADL's/mobility. He drives himself to dialysis appointments. On PT evaluation, pt presents with decreased functional mobility secondary to RLE weakness, pain, and gait abnormalities. Ambulating 80 feet with a walker at a min assist level. Suspect pt will progress well based on motivation and PLOF. He has excellent family support. Recommending HHPT to address deficits and maximize functional independence.     Follow Up Recommendations Home health PT;Supervision for mobility/OOB    Equipment Recommendations  Rolling walker with 5" wheels;3in1 (PT)    Recommendations for Other Services OT consult     Precautions / Restrictions Precautions Precautions: Fall;Posterior Hip Precaution Booklet Issued: Yes (comment) Precaution Comments: verbally reviewed with pt/family and provided written handout Restrictions Weight Bearing Restrictions: Yes RLE Weight Bearing: Weight bearing as tolerated      Mobility  Bed Mobility Overal bed mobility: Needs Assistance Bed Mobility: Supine to Sit     Supine to sit: Mod assist     General bed mobility comments: ModA for RLE management out of bed and trunk to upright. Pt scooting hips well.  Transfers Overall transfer level: Needs assistance Equipment used: Rolling walker (2 wheeled) Transfers: Sit to/from Stand Sit to Stand: Min assist         General transfer comment: MinA to rise to stand from elevated bed height. Cues for hand  placement.  Ambulation/Gait Ambulation/Gait assistance: Min assist Gait Distance (Feet): 80 Feet Assistive device: Rolling walker (2 wheeled) Gait Pattern/deviations: Step-to pattern;Decreased step length - left;Decreased dorsiflexion - right;Decreased weight shift to right;Antalgic Gait velocity: decreased Gait velocity interpretation: <1.31 ft/sec, indicative of household ambulator General Gait Details: Cues for walker use, proximity, sequencing. Pt with 2 episodes of minor lateral LOB, requiring minA to correct. Cues for stopping to regain balance.   Stairs            Wheelchair Mobility    Modified Rankin (Stroke Patients Only)       Balance Overall balance assessment: Needs assistance Sitting-balance support: Feet supported Sitting balance-Leahy Scale: Good     Standing balance support: Bilateral upper extremity supported Standing balance-Leahy Scale: Poor                               Pertinent Vitals/Pain Pain Assessment: Faces Faces Pain Scale: Hurts little more Pain Location: R hip Pain Descriptors / Indicators: Grimacing;Guarding Pain Intervention(s): Monitored during session;Repositioned    Home Living Family/patient expects to be discharged to:: Private residence Living Arrangements: Spouse/significant other Available Help at Discharge: Family;Available 24 hours/day Type of Home: House Home Access: Stairs to enter   [employer] of Steps: 1 Home Layout: One level Home Equipment: Shower seat      Prior Function Level of Independence: Independent         Comments: Community ambulatory, drives self to dialysis     Hand Dominance        Extremity/Trunk Assessment   Upper Extremity Assessment Upper Extremity Assessment: Overall WFL for  tasks assessed    Lower Extremity Assessment Lower Extremity Assessment: RLE deficits/detail RLE Deficits / Details: femoral neck fx s/p hemiarthroplasty. Unable to perform SLR,  ankle dorsiflexion 5/5       Communication   Communication: No difficulties  Cognition Arousal/Alertness: Awake/alert Behavior During Therapy: WFL for tasks assessed/performed Overall Cognitive Status: Within Functional Limits for tasks assessed                                        General Comments      Exercises General Exercises - Lower Extremity Ankle Circles/Pumps: Right;10 reps;Supine Quad Sets: Right;10 reps;Supine   Assessment/Plan    PT Assessment Patient needs continued PT services  PT Problem List Decreased strength;Decreased balance;Decreased mobility;Decreased activity tolerance;Pain       PT Treatment Interventions DME instruction;Gait training;Stair training;Functional mobility training;Therapeutic activities;Therapeutic exercise;Balance training;Patient/family education    PT Goals (Current goals can be found in the Care Plan section)  Acute Rehab PT Goals Patient Stated Goal: "not go to a nursing home." PT Goal Formulation: With patient/family Time For Goal Achievement: 05/16/20 Potential to Achieve Goals: Good    Frequency Min 5X/week   Barriers to discharge        Co-evaluation               AM-PAC PT "6 Clicks" Mobility  Outcome Measure Help needed turning from your back to your side while in a flat bed without using bedrails?: A Little Help needed moving from lying on your back to sitting on the side of a flat bed without using bedrails?: A Lot Help needed moving to and from a bed to a chair (including a wheelchair)?: A Little Help needed standing up from a chair using your arms (e.g., wheelchair or bedside chair)?: A Little Help needed to walk in hospital room?: A Little Help needed climbing 3-5 steps with a railing? : A Little 6 Click Score: 17    End of Session Equipment Utilized During Treatment: Gait belt Activity Tolerance: Patient tolerated treatment well Patient left: in chair;with call bell/phone within  reach;with family/visitor present Nurse Communication: Mobility status PT Visit Diagnosis: Unsteadiness on feet (R26.81);History of falling (Z91.81);Difficulty in walking, not elsewhere classified (R26.2);Pain Pain - Right/Left: Right Pain - part of body: Hip    Time: 0820-0854 PT Time Calculation (min) (ACUTE ONLY): 34 min   Charges:   PT Evaluation $PT Eval Moderate Complexity: 1 Mod PT Treatments $Gait Training: 8-22 mins          Wyona Almas, PT, DPT Acute Rehabilitation Services Pager [phone] Office [phone]   Deno Etienne 05/02/2020, 10:28 AM

## 2020-05-02 NOTE — Progress Notes (Signed)
[facility] Progress Note   Subjective:  Had temp cath placed per IR yesterday and completed HD without issue. No complaints today, up with PT yesterday, pain controlled. Family at bedside.   Objective Vitals:   05/01/20 1731 05/01/20 2022 05/02/20 0326 05/02/20 0908  BP: 140/82 136/75 122/79 126/77  Pulse: 90 90 75 77  Resp: 20 16 16 16   Temp: 98.4 F (36.9 C) 99 F (37.2 C) 98 F (36.7 C) 97.9 F (36.6 C)  TempSrc: Oral Oral Oral Oral  SpO2:  96% 97% 100%  Weight:      Height:         Additional Objective Labs: Basic Metabolic Panel: Recent Labs  Lab 04/29/20 0712 04/30/20 0515 05/01/20 0248  NA 135 134* 133*  K 4.6 4.6 5.8*  CL 94* 94* 96*  CO2 27 30 24   GLUCOSE 135* 75 133*  BUN 31* 17 35*  CREATININE 8.36* 5.93* 8.02*  CALCIUM 9.0 8.8* 8.4*  PHOS  --  4.1  --    CBC: Recent Labs  Lab 04/29/20 0712 04/30/20 0515 05/01/20 0248  WBC 7.9 5.8 6.4  NEUTROABS 5.7  --   --   HGB 10.3* 10.8* 9.9*  HCT 31.0* 31.8* 29.3*  MCV 100.0 99.1 99.0  PLT 183 168 155   Blood Culture    Component Value Date/Time   SDES BLOOD LEFT ARM 05/22/2019 1559   SDES BLOOD LEFT ARM 05/22/2019 1559   SPECREQUEST  05/22/2019 1559    BOTTLES DRAWN AEROBIC AND ANAEROBIC Blood Culture results may not be optimal due to an inadequate volume of blood received in culture bottles   SPECREQUEST  05/22/2019 1559    BOTTLES DRAWN AEROBIC AND ANAEROBIC Blood Culture adequate volume   CULT  05/22/2019 1559    NO GROWTH 5 DAYS Performed at [facility], [place] [address] [place], [place] [address]    CULT  05/22/2019 1559    NO GROWTH 5 DAYS Performed at [facility], [place] [address], [place], [place] [address]    REPTSTATUS 05/27/2019 FINAL 05/22/2019 1559   REPTSTATUS 05/27/2019 FINAL 05/22/2019 1559     Physical Exam General: Elderly male, well appearing,nad  Heart: RRR Lungs: Clear bilaterally  Abdomen: soft, non-tender Extremities: no LE edema   Dialysis Access: Temp cath right neck; LUE AVG , no bruit this am   Medications: .  ceFAZolin (ANCEF) IV 1 g (05/01/20 2154)   . acetaminophen  500 mg Oral Q8H  . allopurinol  100 mg Oral q AM  . amLODipine  10 mg Oral QHS  . [START ON 05/04/2020] apixaban  2.5 mg Oral BID  . cetirizine  5 mg Oral QPM  . cloNIDine  0.1 mg Oral BID  . docusate sodium  100 mg Oral BID  . feeding supplement (NEPRO CARB STEADY)  237 mL Oral BID BM  . heparin injection (subcutaneous)  5,000 Units Subcutaneous Q8H  . levothyroxine  175 mcg Oral QAC breakfast  . metoprolol tartrate  25 mg Oral BID  . mometasone-formoterol  2 puff Inhalation BID  . multivitamin  1 tablet Oral q AM  . pantoprazole  80 mg Oral Daily  . pramipexole  0.25 mg Oral QHS  . rosuvastatin  10 mg Oral QHS  . sodium zirconium cyclosilicate  10 g Oral Once    OP HD: TTS [facility]   4h  400/A1.5  72kg   3K/2.5 bath  AVG   ? heparin  mircera 50  mcg every 4 weeks - last on 04/20/20   venofer 50 mg weekly  Calcitriol 0.75 mcg three times a week with HD   Assessment/Plan: 1. Right hip fx s/p mechanical fall. S/p R hemiarthroplasty 6/18. Per ortho. Avoid morphine in ESRD patient.  2. ESRD - HD TTS. AVG clotted 6/19. Temp cath placed per IR 6/19 with plans for declot Monday. Next HD 6/21.  3. HTN/volume-  BP controlled. OP EDW just lowered. Net UF 2L with pre HD wt 72kg on 6/19.  4. Anemia-  Hb 9.9. Not on ESA as outpatient.  Follow.  5. MBD of CKD -  Ca/Phos at goal. Continue binders/calcitriol  6. Nutrition - Renal diet/prot supp when eating   Lynnda Child PA-C [facility] 05/02/2020,9:34 AM

## 2020-05-02 NOTE — Plan of Care (Signed)

## 2020-05-02 NOTE — Progress Notes (Signed)
   ORTHOPAEDIC PROGRESS NOTE  s/p Procedure(s): ARTHROPLASTY  HIP (HEMIARTHROPLASTY) on 04/30/2020 with Dr. Marcelino Scot  SUBJECTIVE: Patient sitting up in recliner. Minimal pain in hip. No chest pain. No SOB. No nausea/vomiting. No other complaints.  OBJECTIVE: PE: Right lower extremity: incision CDI, leg lengths equal, intact EHL/TA/GSC, distal sensation intact, warm well perfused foot   Vitals:   05/02/20 0326 05/02/20 0908  BP: 122/79 126/77  Pulse: 75 77  Resp: 16 16  Temp: 98 F (36.7 C) 97.9 F (36.6 C)  SpO2: 97% 100%     ASSESSMENT: Jeremy Johnson is a 74 y.o. male doing well postoperatively. POD#2  PLAN: Weightbearing: WBAT RLE Insicional and dressing care: Daily dressing changes with mepilex starting on 05/03/2020 Orthopedic device(s): walker Showering: ok to shower and clean wounds with soap and water starting on 05/03/2020. No lotions or ointments on wound VTE prophylaxis: On Heparin. Transition to eliquis x 35 days due to ESRD on dialysis Pain control: Scheduled Tylenol. Norco PRN.  Follow - up plan: 2 weeks Contact information: After hours and holidays please check [email or web address] for group call information for [facility]  Noemi Chapel, PA-C 05/02/2020

## 2020-05-02 NOTE — Plan of Care (Signed)
  Problem: Education: Goal: Knowledge of General Education information will improve Description: Including pain rating scale, medication(s)/side effects and non-pharmacologic comfort measures Outcome: Progressing   Problem: Health Behavior/Discharge Planning: Goal: Ability to manage health-related needs will improve Outcome: Progressing   Problem: Activity: Goal: Risk for activity intolerance will decrease Outcome: Progressing   Problem: Nutrition: Goal: Adequate nutrition will be maintained Outcome: Progressing   Problem: Coping: Goal: Level of anxiety will decrease Outcome: Progressing   Problem: Elimination: Goal: Will not experience complications related to bowel motility Outcome: Progressing   Problem: Pain Managment: Goal: General experience of comfort will improve Outcome: Progressing   

## 2020-05-02 NOTE — Plan of Care (Signed)

## 2020-05-02 NOTE — Progress Notes (Signed)
PROGRESS NOTE    Jeremy Johnson  MEQ:[id number] DOB: [birth year] DOA: 04/29/2020 PCP: [name], MD    Brief Narrative:  74 y.o. male with medical history of ESRD (T-T-S) at [facility], coronary artery disease, COPD, hypertension, hyperlipidemia, small cell lung cancer, hypothyroidism presenting after mechanical fall while getting dressed to go to dialysis in the early a.m. 04/29/2020.  The patient denies any syncope.  The patient had been in his usual state of health until his mechanical fall.  He had denied any fevers, chills, headache, chest pain, shortness breath, coughing, hemoptysis, nausea, vomiting, diarrhea, abdominal pain, hematochezia, melena, dizziness.  The patient has a remote history of MI back in 1997 but has not seen a cardiologist for over 10 years after he was released by Dr. Lia Foyer for being medically stable.  The patient denies any history of stroke.  He is compliant with all his medications.  He was last dialyzed on 04/27/2020 and had a full dialysis session. In the emergency department, the patient was afebrile hemodynamically stable with oxygen saturation 97% on room air.  WBC 7.9, hemoglobin 10.3, platelets 183,000.  Potassium 4.6, sodium 135, serum creatinine 8.36.  Nephrology and orthopedics were consulted to assist with management.  Assessment & Plan:   Active Problems:   Essential hypertension   Hyperlipidemia   Small cell lung cancer (HCC)   Anemia in chronic kidney disease (CKD)   ESRD (end stage renal disease) on dialysis (HCC)   Hypothyroidism, unspecified   Closed displaced fracture of right femoral neck with delayed healing   Closed displaced fracture of right femoral neck (HCC)  Right femoral neck fracture -Orthopedics consulted and pt now s/p R hemiarthoplasty 6/18 -Cont with post-op care per Orthopedic Surgey -Therapy recs for HHPTOT with 3 in 1 commode and rolling walker  ESRD -Dialyzes Tuesday, Thursday, Saturday -Nephrology following -HD graft  clotted and currently has temporary HD cath -Plans for declot tomorrow noted  Coronary artery disease -No chest pain presently -Aspirin was held perioperatively  in anticipation for surgery -Stable at this time. Now on eliquis for post-op prophylaxis per Ortho. Once completing course of eliquis, would resume ASA as tolerated  Non-small cell lung cancer -Patient follows Dr. Earlie Server -Status post 4 cycles carboplatin and etoposide -Currently under observation -Most recent CT reportedly did not show any recurrence  Essential hypertension -Continued on metoprolol tartrate, amlodipine and clonidine -BP currently well controlled  COPD/pulmonary fibrosis -Stable on room air currently -Continue Advair home dose as tolerated  Hypothyroidism -Continue with Synthroid as tolerated  Hyperlipidemia -Continue Crestor as pt tolerates  Restless leg syndrome -Continue pramipexole as pt tolerates  GERD -Continue PPI  DVT prophylaxis: eliquis Code Status: Full Family Communication: Pt in room, family is currently at bedside  Status is: Inpatient  Remains inpatient appropriate because:Ongoing diagnostic testing needed not appropriate for outpatient work up   Dispo: The patient is from: Home              Anticipated d/c is to: Home              Anticipated d/c date is: 1 day              Patient currently is not medically stable to d/c.    Consultants:   Orthopedic Surgery  Nephrology  IR  Procedures:   R hemiarthoplasty 6/18  Temp HD cath placement 6/19  Antimicrobials: Anti-infectives (From admission, onward)   Start     Dose/Rate Route Frequency Ordered Stop   05/01/20  1200  ceFAZolin (ANCEF) IVPB 1 g/50 mL premix       Note to Pharmacy: Tolerated intra-op ancef   1 g 100 mL/hr over 30 Minutes Intravenous Every 24 hours 04/30/20 1546 05/02/20 1217   04/30/20 0800  ceFAZolin (ANCEF) IVPB 2g/100 mL premix        2 g 200 mL/hr over 30 Minutes Intravenous To  [facility] 04/29/20 1644 04/30/20 1238      Subjective: Without complaints this AM  Objective: Vitals:   05/01/20 1731 05/01/20 2022 05/02/20 0326 05/02/20 0908  BP: 140/82 136/75 122/79 126/77  Pulse: 90 90 75 77  Resp: '20 16 16 16  ' Temp: 98.4 F (36.9 C) 99 F (37.2 C) 98 F (36.7 C) 97.9 F (36.6 C)  TempSrc: Oral Oral Oral Oral  SpO2:  96% 97% 100%  Weight:      Height:        Intake/Output Summary (Last 24 hours) at 05/02/2020 1715 Last data filed at 05/02/2020 1502 Gross per 24 hour  Intake 340 ml  Output 2000 ml  Net -1660 ml   Filed Weights   04/29/20 1459 04/30/20 1058 05/01/20 1401  Weight: 67.8 kg 67.8 kg 72 kg    Examination: General exam: Conversant, in no acute distress Respiratory system: normal chest rise, clear, no audible wheezing Cardiovascular system: regular rhythm, s1-s2 Gastrointestinal system: Nondistended, nontender, pos BS Central nervous system: No seizures, no tremors Extremities: No cyanosis, no joint deformities Skin: No rashes, no pallor Psychiatry: Affect normal // no auditory hallucinations   Data Reviewed: I have personally reviewed following labs and imaging studies  CBC: Recent Labs  Lab 04/29/20 0712 04/30/20 0515 05/01/20 0248 05/02/20 0920  WBC 7.9 5.8 6.4 8.0  NEUTROABS 5.7  --   --   --   HGB 10.3* 10.8* 9.9* 9.6*  HCT 31.0* 31.8* 29.3* 29.0*  MCV 100.0 99.1 99.0 100.3*  PLT 183 168 155 458   Basic Metabolic Panel: Recent Labs  Lab 04/29/20 0712 04/30/20 0515 05/01/20 0248 05/02/20 0920  NA 135 134* 133* 136  K 4.6 4.6 5.8* 4.3  CL 94* 94* 96* 99  CO2 '27 30 24 25  ' GLUCOSE 135* 75 133* 145*  BUN 31* 17 35* 25*  CREATININE 8.36* 5.93* 8.02* 5.94*  CALCIUM 9.0 8.8* 8.4* 8.6*  PHOS  --  4.1  --   --    GFR: Estimated Creatinine Clearance: 11.1 mL/min (A) (by C-G formula based on SCr of 5.94 mg/dL (H)). Liver Function Tests: Recent Labs  Lab 04/30/20 0515  ALBUMIN 3.1*   No results for  input(s): LIPASE, AMYLASE in the last 168 hours. No results for input(s): AMMONIA in the last 168 hours. Coagulation Profile: Recent Labs  Lab 04/29/20 0712  INR 1.1   Cardiac Enzymes: No results for input(s): CKTOTAL, CKMB, CKMBINDEX, TROPONINI in the last 168 hours. BNP (last 3 results) No results for input(s): PROBNP in the last 8760 hours. HbA1C: No results for input(s): HGBA1C in the last 72 hours. CBG: No results for input(s): GLUCAP in the last 168 hours. Lipid Profile: No results for input(s): CHOL, HDL, LDLCALC, TRIG, CHOLHDL, LDLDIRECT in the last 72 hours. Thyroid Function Tests: No results for input(s): TSH, T4TOTAL, FREET4, T3FREE, THYROIDAB in the last 72 hours. Anemia Panel: No results for input(s): VITAMINB12, FOLATE, FERRITIN, TIBC, IRON, RETICCTPCT in the last 72 hours. Sepsis Labs: No results for input(s): PROCALCITON, LATICACIDVEN in the last 168 hours.  Recent Results (from the past 240  hour(s))  SARS Coronavirus 2 by RT PCR (hospital order, performed in [facility] hospital lab) Nasopharyngeal Nasopharyngeal Swab     Status: None   Collection Time: 04/29/20  8:35 AM   Specimen: Nasopharyngeal Swab  Result Value Ref Range Status   SARS Coronavirus 2 NEGATIVE NEGATIVE Final    Comment: (NOTE) SARS-CoV-2 target nucleic acids are NOT DETECTED.  The SARS-CoV-2 RNA is generally detectable in upper and lower respiratory specimens during the acute phase of infection. The lowest concentration of SARS-CoV-2 viral copies this assay can detect is 250 copies / mL. A negative result does not preclude SARS-CoV-2 infection and should not be used as the sole basis for treatment or other patient management decisions.  A negative result may occur with improper specimen collection / handling, submission of specimen other than nasopharyngeal swab, presence of viral mutation(s) within the areas targeted by this assay, and inadequate number of viral copies (<250 copies /  mL). A negative result must be combined with clinical observations, patient history, and epidemiological information.  Fact Sheet for Patients:   StrictlyIdeas.no  Fact Sheet for Healthcare Providers: BankingDealers.co.za  This test is not yet approved or  cleared by the [place] FDA and has been authorized for detection and/or diagnosis of SARS-CoV-2 by FDA under an Emergency Use Authorization (EUA).  This EUA will remain in effect (meaning this test can be used) for the duration of the COVID-19 declaration under Section 564(b)(1) of the Act, 21 U.S.C. section 360bbb-3(b)(1), unless the authorization is terminated or revoked sooner.  Performed at [facility], [address], [place], [place] [address]   MRSA PCR Screening     Status: None   Collection Time: 04/29/20  5:31 PM   Specimen: Nasal Mucosa; Nasopharyngeal  Result Value Ref Range Status   MRSA by PCR NEGATIVE NEGATIVE Final    Comment:        The GeneXpert MRSA Assay (FDA approved for NASAL specimens only), is one component of a comprehensive MRSA colonization surveillance program. It is not intended to diagnose MRSA infection nor to guide or monitor treatment for MRSA infections. Performed at [facility], [place] [address], [place], [place] [address]      Radiology Studies: IR US Guide Vasc Access Right  Result Date: 05/01/2020 INDICATION: End-stage renal disease EXAM: IMAGE GUIDED TEMPORARY HEMODIALYSIS CATHETER MEDICATIONS: None ANESTHESIA/SEDATION: None FLUOROSCOPY TIME:  Fluoroscopy Time: 0 minutes 12 seconds COMPLICATIONS: None PROCEDURE: Informed written consent was obtained from the patient's family after a discussion of the risks, benefits, and alternatives to treatment. Questions regarding the procedure were encouraged and answered. The right neck was prepped with chlorhexidine in a sterile fashion, and a sterile drape was applied covering the  operative field. Maximum barrier sterile technique with sterile gowns and gloves were used for the procedure. A timeout was performed prior to the initiation of the procedure. A micropuncture kit was utilized to access the right internal jugular vein under direct, real-time ultrasound guidance after the overlying soft tissues were anesthetized with 1% lidocaine with epinephrine. Ultrasound image documentation was performed. The microwire was kinked to measure appropriate catheter length. A stiff glidewire was advanced to the level of the IVC. A 16 cm hemodialysis catheter was then placed over the wire. Final catheter positioning was confirmed and documented with a spot radiographic image. The catheter aspirates and flushes normally. The catheter was flushed with appropriate volume heparin dwells. Dressings were applied. The patient tolerated the procedure well without immediate post procedural complication.  IMPRESSION: Status post image guided temporary hemodialysis catheter placement. Signed, Dulcy Fanny. Dellia Nims, RPVI [facility] Electronically Signed   By: Corrie Mckusick D.O.   On: 05/01/2020 13:56    Scheduled Meds: . acetaminophen  500 mg Oral Q8H  . allopurinol  100 mg Oral q AM  . amLODipine  10 mg Oral QHS  . [START ON 05/04/2020] apixaban  2.5 mg Oral BID  . cetirizine  5 mg Oral QPM  . cloNIDine  0.1 mg Oral BID  . docusate sodium  100 mg Oral BID  . feeding supplement (NEPRO CARB STEADY)  237 mL Oral BID BM  . heparin injection (subcutaneous)  5,000 Units Subcutaneous Q8H  . levothyroxine  175 mcg Oral QAC breakfast  . metoprolol tartrate  25 mg Oral BID  . mometasone-formoterol  2 puff Inhalation BID  . multivitamin  1 tablet Oral q AM  . pantoprazole  80 mg Oral Daily  . pramipexole  0.25 mg Oral QHS  . rosuvastatin  10 mg Oral QHS  . sodium zirconium cyclosilicate  10 g Oral Once   Continuous Infusions:    LOS: 3 days    Marylu Lund, MD [facility] Pager On Amion  If 7PM-7AM, please contact night-coverage 05/02/2020, 5:15 PM

## 2020-05-02 NOTE — Evaluation (Signed)
Occupational Therapy Evaluation Patient Details Name: Jeremy Johnson MRN: [id number] DOB: May 16, 1946 Today's Date: 05/02/2020    History of Present Illness Pt is a 74 y.o. M with significant PMH of ESRD, CAD, COPD, hypertension, small cell  lung CA who presents after a mechanical fall that resulted in a right femoral neck fracture. Now s/p R hemiarthroplasty 6/18. HD graft clotted 6/19, requiring temporary HD cath placement.    Clinical Impression   Pt admitted with the above diagnoses and presents with below problem list. Pt will benefit from continued acute OT to address the below listed deficits and maximize independence with basic ADLs prior to d/c home. PTA pt was independent with ADLs, drives. Pt is currently min to mod A with LB ADLs and pericare in sit<>stand, min A with functional mobility and transfers. Spouse present and reports she is able to assist 24/7 at home. Increased R hip pain during OOB activity.      Follow Up Recommendations  Home health OT;Supervision - Intermittent (OOB/mobility)    Equipment Recommendations  3 in 1 bedside commode    Recommendations for Other Services       Precautions / Restrictions Precautions Precautions: Fall;Posterior Hip Precaution Booklet Issued: Yes (comment) Precaution Comments: reviewed with pt and family Restrictions Weight Bearing Restrictions: Yes RLE Weight Bearing: Weight bearing as tolerated      Mobility Bed Mobility Overal bed mobility: Needs Assistance Bed Mobility: Supine to Sit     Supine to sit: Mod assist     General bed mobility comments: up in recliner at start and end of session  Transfers Overall transfer level: Needs assistance Equipment used: Rolling walker (2 wheeled) Transfers: Sit to/from Stand Sit to Stand: Min assist         General transfer comment: min A to steady up and control descent down. Cues for hand placement and technique to avoid hip flexion >90*    Balance Overall balance  assessment: Needs assistance Sitting-balance support: Feet supported Sitting balance-Leahy Scale: Good     Standing balance support: Bilateral upper extremity supported Standing balance-Leahy Scale: Poor Standing balance comment: BUE support in static stand                           ADL either performed or assessed with clinical judgement   ADL Overall ADL's : Needs assistance/impaired Eating/Feeding: Set up;Sitting   Grooming: Set up;Sitting   Upper Body Bathing: Set up;Sitting   Lower Body Bathing: Moderate assistance;Sit to/from stand;Minimal assistance   Upper Body Dressing : Set up;Sitting   Lower Body Dressing: Minimal assistance;Moderate assistance;Sit to/from stand   Toilet Transfer: Minimal assistance;Ambulation;BSC;RW Toilet Transfer Details (indicate cue type and reason): 3n1 over toilet Toileting- Clothing Manipulation and Hygiene: Moderate assistance;Sit to/from stand Toileting - Clothing Manipulation Details (indicate cue type and reason): BUE in standing position, assist to steady during transfer and to fully complete pericare Tub/ Shower Transfer: Minimal assistance;3 in 1;Rolling walker   Functional mobility during ADLs: Min guard;Minimal assistance;Rolling walker General ADL Comments: Pt ambulated recliner <> bathroom to complete toilet transfer. Began education on LB ADLs and post hip precautions     Vision         Perception     Praxis      Pertinent Vitals/Pain Pain Assessment: Faces Faces Pain Scale: Hurts little more Pain Location: R hip Pain Descriptors / Indicators: Grimacing;Guarding Pain Intervention(s): Monitored during session;Repositioned;Limited activity within patient's tolerance     Hand Dominance  Extremity/Trunk Assessment Upper Extremity Assessment Upper Extremity Assessment: Overall WFL for tasks assessed   Lower Extremity Assessment Lower Extremity Assessment: Defer to PT evaluation RLE Deficits / Details:  femoral neck fx s/p hemiarthroplasty. Unable to perform SLR, ankle dorsiflexion 5/5       Communication Communication Communication: No difficulties   Cognition Arousal/Alertness: Awake/alert Behavior During Therapy: WFL for tasks assessed/performed Overall Cognitive Status: Within Functional Limits for tasks assessed                                     General Comments       Exercises Exercises: General Lower Extremity General Exercises - Lower Extremity Ankle Circles/Pumps: Right;10 reps;Supine Quad Sets: Right;10 reps;Supine   Shoulder Instructions      Home Living Family/patient expects to be discharged to:: Private residence Living Arrangements: Spouse/significant other Available Help at Discharge: Family;Available 24 hours/day Type of Home: House Home Access: Stairs to enter [employer] of Steps: 1   Home Layout: One level     Bathroom Shower/Tub: Occupational psychologist: Standard     Home Equipment: Shower seat - built in          Prior Functioning/Environment Level of Independence: Independent        Comments: Community ambulatory, drives self to dialysis        OT Problem List: Impaired balance (sitting and/or standing);Decreased activity tolerance;Decreased knowledge of use of DME or AE;Decreased knowledge of precautions;Pain      OT Treatment/Interventions: Self-care/ADL training;DME and/or AE instruction;Therapeutic activities;Patient/family education;Balance training    OT Goals(Current goals can be found in the care plan section) Acute Rehab OT Goals Patient Stated Goal: "not go to a nursing home." OT Goal Formulation: With patient/family Time For Goal Achievement: 05/16/20 Potential to Achieve Goals: Good ADL Goals Pt Will Perform Lower Body Bathing: with modified independence;with adaptive equipment;sit to/from stand Pt Will Perform Lower Body Dressing: with modified independence;sit to/from  stand;with adaptive equipment Pt Will Transfer to Toilet: with modified independence;ambulating Pt Will Perform Toileting - Clothing Manipulation and hygiene: with modified independence;sit to/from stand Pt Will Perform Tub/Shower Transfer: Shower transfer;with supervision;ambulating;3 in 1;rolling walker Additional ADL Goal #1: Pt will complete bed mobility at mod I level to prepare for OOB/EOB ADLs.  OT Frequency: Min 2X/week   Barriers to D/C:            Co-evaluation              AM-PAC OT "6 Clicks" Daily Activity     Outcome Measure Help from another person eating meals?: None Help from another person taking care of personal grooming?: None Help from another person toileting, which includes using toliet, bedpan, or urinal?: A Little Help from another person bathing (including washing, rinsing, drying)?: A Little Help from another person to put on and taking off regular upper body clothing?: A Little Help from another person to put on and taking off regular lower body clothing?: A Little 6 Click Score: 20   End of Session Equipment Utilized During Treatment: Rolling walker  Activity Tolerance: Patient tolerated treatment well Patient left: in chair;with call bell/phone within reach;with family/visitor present  OT Visit Diagnosis: Unsteadiness on feet (R26.81);Pain Pain - Right/Left: Right Pain - part of body: Hip                Time: 4782-9562 OT Time Calculation (min): 29 min Charges:  OT  General Charges $OT Visit: 1 Visit OT Evaluation $OT Eval Low Complexity: 1 Low OT Treatments $Self Care/Home Management : 8-22 mins  Tyrone Schimke, OT Acute Rehabilitation Services Pager: [phone] Office: [phone]   Hortencia Pilar 05/02/2020, 12:01 PM

## 2020-05-03 ENCOUNTER — Inpatient Hospital Stay (HOSPITAL_COMMUNITY): Payer: Medicare Other

## 2020-05-03 HISTORY — PX: IR THROMBECTOMY AV FISTULA W/THROMBOLYSIS/PTA INC/SHUNT/IMG LEFT: IMG6106

## 2020-05-03 HISTORY — PX: IR US GUIDE VASC ACCESS LEFT: IMG2389

## 2020-05-03 LAB — CBC
HCT: 25.9 % — ABNORMAL LOW (ref 39.0–52.0)
Hemoglobin: 8.5 g/dL — ABNORMAL LOW (ref 13.0–17.0)
MCH: 33.1 pg (ref 26.0–34.0)
MCHC: 32.8 g/dL (ref 30.0–36.0)
MCV: 100.8 fL — ABNORMAL HIGH (ref 80.0–100.0)
Platelets: 164 K/uL (ref 150–400)
RBC: 2.57 MIL/uL — ABNORMAL LOW (ref 4.22–5.81)
RDW: 15.9 % — ABNORMAL HIGH (ref 11.5–15.5)
WBC: 7 K/uL (ref 4.0–10.5)
nRBC: 0 % (ref 0.0–0.2)

## 2020-05-03 LAB — RENAL FUNCTION PANEL
Albumin: 2.4 g/dL — ABNORMAL LOW (ref 3.5–5.0)
Anion gap: 13 (ref 5–15)
BUN: 39 mg/dL — ABNORMAL HIGH (ref 8–23)
CO2: 25 mmol/L (ref 22–32)
Calcium: 8.2 mg/dL — ABNORMAL LOW (ref 8.9–10.3)
Chloride: 98 mmol/L (ref 98–111)
Creatinine, Ser: 7.73 mg/dL — ABNORMAL HIGH (ref 0.61–1.24)
GFR calc Af Amer: 7 mL/min — ABNORMAL LOW
GFR calc non Af Amer: 6 mL/min — ABNORMAL LOW
Glucose, Bld: 101 mg/dL — ABNORMAL HIGH (ref 70–99)
Phosphorus: 3.5 mg/dL (ref 2.5–4.6)
Potassium: 4.4 mmol/L (ref 3.5–5.1)
Sodium: 136 mmol/L (ref 135–145)

## 2020-05-03 MED ORDER — MIDAZOLAM HCL 2 MG/2ML IJ SOLN
INTRAMUSCULAR | Status: AC
  Filled 2020-05-03: qty 2

## 2020-05-03 MED ORDER — FENTANYL CITRATE (PF) 100 MCG/2ML IJ SOLN
INTRAMUSCULAR | Status: AC | PRN
  Administered 2020-05-03 (×2): 25 ug via INTRAVENOUS

## 2020-05-03 MED ORDER — IOHEXOL 300 MG/ML  SOLN
100.0000 mL | Freq: Once | INTRAMUSCULAR | Status: AC | PRN
  Administered 2020-05-03: 40 mL via INTRAVENOUS

## 2020-05-03 MED ORDER — HEPARIN SODIUM (PORCINE) 1000 UNIT/ML IJ SOLN
INTRAMUSCULAR | Status: AC
  Filled 2020-05-03: qty 1

## 2020-05-03 MED ORDER — ALTEPLASE 2 MG IJ SOLR
INTRAMUSCULAR | Status: AC
  Filled 2020-05-03: qty 2

## 2020-05-03 MED ORDER — POLYETHYLENE GLYCOL 3350 17 G PO PACK
17.0000 g | PACK | Freq: Every day | ORAL | Status: DC
  Administered 2020-05-03: 17 g via ORAL
  Filled 2020-05-03 (×2): qty 1

## 2020-05-03 MED ORDER — HEPARIN SODIUM (PORCINE) 1000 UNIT/ML IJ SOLN
INTRAMUSCULAR | Status: AC | PRN
  Administered 2020-05-03: 3000 [IU] via INTRAVENOUS

## 2020-05-03 MED ORDER — CALCITRIOL 0.5 MCG PO CAPS
0.7500 ug | ORAL_CAPSULE | ORAL | Status: DC
  Administered 2020-05-04: 0.75 ug via ORAL
  Filled 2020-05-03: qty 1

## 2020-05-03 MED ORDER — MIDAZOLAM HCL 2 MG/2ML IJ SOLN
INTRAMUSCULAR | Status: AC | PRN
  Administered 2020-05-03: 0.5 mg via INTRAVENOUS
  Administered 2020-05-03: 1 mg via INTRAVENOUS

## 2020-05-03 MED ORDER — FENTANYL CITRATE (PF) 100 MCG/2ML IJ SOLN
INTRAMUSCULAR | Status: AC
  Filled 2020-05-03: qty 2

## 2020-05-03 MED ORDER — LIDOCAINE HCL 1 % IJ SOLN
INTRAMUSCULAR | Status: AC | PRN
  Administered 2020-05-03: 10 mL

## 2020-05-03 MED ORDER — LIDOCAINE HCL 1 % IJ SOLN
INTRAMUSCULAR | Status: AC
  Filled 2020-05-03: qty 20

## 2020-05-03 MED ORDER — CHLORHEXIDINE GLUCONATE CLOTH 2 % EX PADS: 6.0000 | MEDICATED_PAD | Freq: Every day | CUTANEOUS | Status: DC

## 2020-05-03 MED ORDER — MOMETASONE FURO-FORMOTEROL FUM 200-5 MCG/ACT IN AERO
2.0000 | INHALATION_SPRAY | Freq: Two times a day (BID) | RESPIRATORY_TRACT | Status: DC
  Filled 2020-05-03: qty 8.8

## 2020-05-03 NOTE — Progress Notes (Signed)
1410 Pt to IR for thrombectomy. A&O x4.

## 2020-05-03 NOTE — Progress Notes (Signed)
PROGRESS NOTE    Jeremy Johnson  YBO:[id number] DOB: 05-May-1946 DOA: 04/29/2020 PCP: [name], MD    Brief Narrative:  74 y.o. male with medical history of ESRD (T-T-S) at [facility], coronary artery disease, COPD, hypertension, hyperlipidemia, small cell lung cancer, hypothyroidism presenting after mechanical fall while getting dressed to go to dialysis in the early a.m. 04/29/2020.  The patient denies any syncope.  The patient had been in his usual state of health until his mechanical fall.  He had denied any fevers, chills, headache, chest pain, shortness breath, coughing, hemoptysis, nausea, vomiting, diarrhea, abdominal pain, hematochezia, melena, dizziness.  The patient has a remote history of MI back in 1997 but has not seen a cardiologist for over 10 years after he was released by Dr. Lia Foyer for being medically stable.  The patient denies any history of stroke.  He is compliant with all his medications.  He was last dialyzed on 04/27/2020 and had a full dialysis session. In the emergency department, the patient was afebrile hemodynamically stable with oxygen saturation 97% on room air.  WBC 7.9, hemoglobin 10.3, platelets 183,000.  Potassium 4.6, sodium 135, serum creatinine 8.36.  Nephrology and orthopedics were consulted to assist with management.  Assessment & Plan:   Active Problems:   Essential hypertension   Hyperlipidemia   Small cell lung cancer (HCC)   Anemia in chronic kidney disease (CKD)   ESRD (end stage renal disease) on dialysis (HCC)   Hypothyroidism, unspecified   Closed displaced fracture of right femoral neck with delayed healing   Closed displaced fracture of right femoral neck (HCC)  Right femoral neck fracture -Orthopedics consulted and pt now s/p R hemiarthoplasty 6/18 -Cont with post-op care per Orthopedic Surgey -Therapy recs noted for HHPTOT with 3 in 1 commode and rolling walker, ordered  ESRD -Dialyzes Tuesday, Thursday, Saturday -Nephrology  following -HD graft clotted and currently has temporary HD cath -Pt undergoing thrombectomy 6/21 with trial of HD on 6/22  Coronary artery disease -No chest pain presently -Aspirin was held perioperatively  in anticipation for surgery -Stable at this time. Now on eliquis for post-op prophylaxis per Ortho. Once completing course of eliquis, would resume ASA as tolerated  Non-small cell lung cancer -Patient follows Dr. Earlie Server -Status post 4 cycles carboplatin and etoposide -Currently under observation -Most recent CT reportedly did not show any recurrence  Essential hypertension -Continued on metoprolol tartrate, amlodipine and clonidine -BP presently well controlled  COPD/pulmonary fibrosis -Stable on room air currently -Continue Advair home dose as tolerated  Hypothyroidism -Continue with Synthroid as tolerated  Hyperlipidemia -Continue Crestor as pt tolerates  Restless leg syndrome -Continue pramipexole as pt tolerates  GERD -Continue PPI  DVT prophylaxis: eliquis Code Status: Full Family Communication: Pt in room, family is presently at bedside  Status is: Inpatient  Remains inpatient appropriate because:Ongoing diagnostic testing needed not appropriate for outpatient work up   Dispo: The patient is from: Home              Anticipated d/c is to: Home              Anticipated d/c date is: 1 day              Patient currently is not medically stable to d/c.    Consultants:   Orthopedic Surgery  Nephrology  IR  Procedures:   R hemiarthoplasty 6/18  Temp HD cath placement 6/19  Antimicrobials: Anti-infectives (From admission, onward)   Start     Dose/Rate  Route Frequency Ordered Stop   05/01/20 1200  ceFAZolin (ANCEF) IVPB 1 g/50 mL premix       Note to Pharmacy: Tolerated intra-op ancef   1 g 100 mL/hr over 30 Minutes Intravenous Every 24 hours 04/30/20 1546 05/02/20 1217   04/30/20 0800  ceFAZolin (ANCEF) IVPB 2g/100 mL premix         2 g 200 mL/hr over 30 Minutes Intravenous To ShortStay Surgical 04/29/20 1644 04/30/20 1238      Subjective: Eager to go home  Objective: Vitals:   05/02/20 1957 05/03/20 0409 05/03/20 0758 05/03/20 1440  BP: 135/80 122/78 125/77 (!) 140/91  Pulse: 69 69 83 82  Resp: 16 16 18 19   Temp: 98 F (36.7 C) 98.3 F (36.8 C) 97.9 F (36.6 C)   TempSrc: Oral Oral Oral   SpO2: 99% 95% 96% 96%  Weight:      Height:        Intake/Output Summary (Last 24 hours) at 05/03/2020 1442 Last data filed at 05/03/2020 6160 Gross per 24 hour  Intake 290 ml  Output --  Net 290 ml   Filed Weights   04/29/20 1459 04/30/20 1058 05/01/20 1401  Weight: 67.8 kg 67.8 kg 72 kg    Examination: General exam: Awake, sitting in chair, in nad Respiratory system: Normal respiratory effort, no wheezing Cardiovascular system: regular rate, s1, s2 Gastrointestinal system: Soft, nondistended, positive BS Central nervous system: CN2-12 grossly intact, strength intact Extremities: Perfused, no clubbing Skin: Normal skin turgor, no notable skin lesions seen Psychiatry: Mood normal // no visual hallucinations   Data Reviewed: I have personally reviewed following labs and imaging studies  CBC: Recent Labs  Lab 04/29/20 0712 04/30/20 0515 05/01/20 0248 05/02/20 0920 05/03/20 0432  WBC 7.9 5.8 6.4 8.0 7.0  NEUTROABS 5.7  --   --   --   --   HGB 10.3* 10.8* 9.9* 9.6* 8.5*  HCT 31.0* 31.8* 29.3* 29.0* 25.9*  MCV 100.0 99.1 99.0 100.3* 100.8*  PLT 183 168 155 185 737   Basic Metabolic Panel: Recent Labs  Lab 04/29/20 0712 04/30/20 0515 05/01/20 0248 05/02/20 0920 05/03/20 0432  NA 135 134* 133* 136 136  K 4.6 4.6 5.8* 4.3 4.4  CL 94* 94* 96* 99 98  CO2 27 30 24 25 25   GLUCOSE 135* 75 133* 145* 101*  BUN 31* 17 35* 25* 39*  CREATININE 8.36* 5.93* 8.02* 5.94* 7.73*  CALCIUM 9.0 8.8* 8.4* 8.6* 8.2*  PHOS  --  4.1  --   --  3.5   GFR: Estimated Creatinine Clearance: 8.5 mL/min (A) (by C-G  formula based on SCr of 7.73 mg/dL (H)). Liver Function Tests: Recent Labs  Lab 04/30/20 0515 05/03/20 0432  ALBUMIN 3.1* 2.4*   No results for input(s): LIPASE, AMYLASE in the last 168 hours. No results for input(s): AMMONIA in the last 168 hours. Coagulation Profile: Recent Labs  Lab 04/29/20 0712  INR 1.1   Cardiac Enzymes: No results for input(s): CKTOTAL, CKMB, CKMBINDEX, TROPONINI in the last 168 hours. BNP (last 3 results) No results for input(s): PROBNP in the last 8760 hours. HbA1C: No results for input(s): HGBA1C in the last 72 hours. CBG: No results for input(s): GLUCAP in the last 168 hours. Lipid Profile: No results for input(s): CHOL, HDL, LDLCALC, TRIG, CHOLHDL, LDLDIRECT in the last 72 hours. Thyroid Function Tests: No results for input(s): TSH, T4TOTAL, FREET4, T3FREE, THYROIDAB in the last 72 hours. Anemia Panel: No results for input(s):  VITAMINB12, FOLATE, FERRITIN, TIBC, IRON, RETICCTPCT in the last 72 hours. Sepsis Labs: No results for input(s): PROCALCITON, LATICACIDVEN in the last 168 hours.  Recent Results (from the past 240 hour(s))  SARS Coronavirus 2 by RT PCR (hospital order, performed in [facility] hospital lab) Nasopharyngeal Nasopharyngeal Swab     Status: None   Collection Time: 04/29/20  8:35 AM   Specimen: Nasopharyngeal Swab  Result Value Ref Range Status   SARS Coronavirus 2 NEGATIVE NEGATIVE Final    Comment: (NOTE) SARS-CoV-2 target nucleic acids are NOT DETECTED.  The SARS-CoV-2 RNA is generally detectable in upper and lower respiratory specimens during the acute phase of infection. The lowest concentration of SARS-CoV-2 viral copies this assay can detect is 250 copies / mL. A negative result does not preclude SARS-CoV-2 infection and should not be used as the sole basis for treatment or other patient management decisions.  A negative result may occur with improper specimen collection / handling, submission of specimen  other than nasopharyngeal swab, presence of viral mutation(s) within the areas targeted by this assay, and inadequate number of viral copies (<250 copies / mL). A negative result must be combined with clinical observations, patient history, and epidemiological information.  Fact Sheet for Patients:   StrictlyIdeas.no  Fact Sheet for Healthcare Providers: BankingDealers.co.za  This test is not yet approved or  cleared by the [place] FDA and has been authorized for detection and/or diagnosis of SARS-CoV-2 by FDA under an Emergency Use Authorization (EUA).  This EUA will remain in effect (meaning this test can be used) for the duration of the COVID-19 declaration under Section 564(b)(1) of the Act, 21 U.S.C. section 360bbb-3(b)(1), unless the authorization is terminated or revoked sooner.  Performed at [facility], [address] [place], [place] [address]   MRSA PCR Screening     Status: None   Collection Time: 04/29/20  5:31 PM   Specimen: Nasal Mucosa; Nasopharyngeal  Result Value Ref Range Status   MRSA by PCR NEGATIVE NEGATIVE Final    Comment:        The GeneXpert MRSA Assay (FDA approved for NASAL specimens only), is one component of a comprehensive MRSA colonization surveillance program. It is not intended to diagnose MRSA infection nor to guide or monitor treatment for MRSA infections. Performed at [facility], [place] [address] [place], [place] [address]      Radiology Studies: No results found.  Scheduled Meds: . acetaminophen  500 mg Oral Q8H  . allopurinol  100 mg Oral q AM  . alteplase      . amLODipine  10 mg Oral QHS  . [START ON 05/04/2020] apixaban  2.5 mg Oral BID  . [START ON 05/04/2020] calcitRIOL  0.75 mcg Oral Q T,Th,Sa-HD  . cetirizine  5 mg Oral QPM  . Chlorhexidine Gluconate Cloth  6 each Topical Daily  . cloNIDine  0.1 mg Oral BID  . docusate sodium  100 mg Oral BID  . feeding  supplement (NEPRO CARB STEADY)  237 mL Oral BID BM  . fentaNYL      . levothyroxine  175 mcg Oral QAC breakfast  . lidocaine      . metoprolol tartrate  25 mg Oral BID  . midazolam      . multivitamin  1 tablet Oral q AM  . pantoprazole  80 mg Oral Daily  . pramipexole  0.25 mg Oral QHS  . rosuvastatin  10 mg Oral QHS   Continuous Infusions:  LOS: 4 days   Marylu Lund, MD [facility] Pager On Amion  If 7PM-7AM, please contact night-coverage 05/03/2020, 2:42 PM

## 2020-05-03 NOTE — Plan of Care (Signed)

## 2020-05-03 NOTE — Procedures (Signed)
Interventional Radiology Procedure Note  Procedure: Thrombectomy of left upper arm dialysis graft with angioplasty  Complications: None  Estimated Blood Loss: < 10 mL  Findings: Left upper arm brachial artery to brachial vein graft successfully opened. There was a >70% stenosis at venous anastomosis which responded well to 7 mm balloon angioplasty. Focal area of persistent defect in the upper portion of the graft has the appearance of graft infolding/damage by angiography and ultrasound and likely relates to a prior puncture site along upper graft access. This was resistant to balloon angioplasty but good flow was present on completion and an appropriate short length (</= 2 cm) covered stent that can be punctured in the future is not available to place currently.  Plan: Will leave graft as is right now with patent flow. Should there be trouble with flow or recurrent graft thrombosis in future, I would initially ask Vascular Surgery to consult for possible focal graft revision/segmental replacement given appearance of the focal defect by angio and [place].  Venetia Night. Kathlene Cote, M.D Pager:  [phone]

## 2020-05-03 NOTE — Progress Notes (Signed)
Physical Therapy Treatment Patient Details Name: Jeremy Johnson MRN: [id number] DOB: Sep 15, 1946 Today's Date: 05/03/2020    History of Present Illness Pt is a 74 y.o. M with significant PMH of ESRD, CAD, COPD, hypertension, small cell  lung CA who presents after a mechanical fall that resulted in a right femoral neck fracture. Now s/p R hemiarthroplasty 6/18. HD graft clotted 6/19, requiring temporary HD cath placement.     PT Comments    Pt tolerated increased gait distance today as well as practicing 2 steps. Min A for bed mobility and transfers and min-guard A with gait with use of RW. Pt needs vc's for erect posture during gait. Family present end of session and reviewed hip prec and safe sleeping positions. PT will continue to follow.    Follow Up Recommendations  Home health PT;Supervision for mobility/OOB     Equipment Recommendations  Rolling walker with 5" wheels;3in1 (PT)    Recommendations for Other Services       Precautions / Restrictions Precautions Precautions: Fall;Posterior Hip Precaution Booklet Issued: No Precaution Comments: pt able to recall 3/3 precautions, discussed reason for these precautions with pt and family Restrictions Weight Bearing Restrictions: Yes RLE Weight Bearing: Weight bearing as tolerated    Mobility  Bed Mobility Overal bed mobility: Needs Assistance Bed Mobility: Supine to Sit;Sit to Supine     Supine to sit: Min assist Sit to supine: Min assist   General bed mobility comments: min A to RLE  Transfers Overall transfer level: Needs assistance Equipment used: Rolling walker (2 wheeled) Transfers: Sit to/from Stand Sit to Stand: Min guard         General transfer comment: vc's for hand placement, no physical assist needed  Ambulation/Gait Ambulation/Gait assistance: Min guard Gait Distance (Feet): 160 Feet Assistive device: Rolling walker (2 wheeled) Gait Pattern/deviations: Step-to pattern;Decreased step length -  left;Decreased dorsiflexion - right;Decreased weight shift to right;Antalgic Gait velocity: decreased Gait velocity interpretation: <1.8 ft/sec, indicate of risk for recurrent falls General Gait Details: vc's for erect posture and fwd gaze. Pt asking about use of rollator and instructed him to wait on this as it would likely roll too easily and promote more fwd flexion at trunk   Stairs Stairs: Yes Stairs assistance: Min assist Stair Management: Two rails;Step to pattern;Forwards Number of Stairs: 2 General stair comments: min A to steady and vc's for sequencing   Wheelchair Mobility    Modified Rankin (Stroke Patients Only)       Balance Overall balance assessment: Needs assistance Sitting-balance support: Feet supported Sitting balance-Leahy Scale: Good     Standing balance support: Bilateral upper extremity supported Standing balance-Leahy Scale: Poor Standing balance comment: BUE support in static stand                            Cognition Arousal/Alertness: Awake/alert Behavior During Therapy: WFL for tasks assessed/performed Overall Cognitive Status: Within Functional Limits for tasks assessed                                 General Comments: HOH      Exercises Total Joint Exercises Marching in Standing: AROM;10 reps;Standing Standing Hip Extension: AROM;Right;10 reps;Standing Bridges: AROM;5 reps;Supine General Exercises - Lower Extremity Ankle Circles/Pumps: Right;10 reps;Supine Long Arc Quad: AROM;Right;10 reps;Seated    General Comments General comments (skin integrity, edema, etc.): discussed safe sleeping positions  Pertinent Vitals/Pain Pain Assessment: Faces Faces Pain Scale: Hurts a little bit Pain Location: R hip Pain Descriptors / Indicators: Grimacing;Sore Pain Intervention(s): Limited activity within patient's tolerance;Monitored during session    Home Living                      Prior Function             PT Goals (current goals can now be found in the care plan section) Acute Rehab PT Goals Patient Stated Goal: go home PT Goal Formulation: With patient/family Time For Goal Achievement: 05/16/20 Potential to Achieve Goals: Good Progress towards PT goals: Progressing toward goals    Frequency    Min 5X/week      PT Plan Current plan remains appropriate    Co-evaluation              AM-PAC PT "6 Clicks" Mobility   Outcome Measure  Help needed turning from your back to your side while in a flat bed without using bedrails?: A Little Help needed moving from lying on your back to sitting on the side of a flat bed without using bedrails?: A Little Help needed moving to and from a bed to a chair (including a wheelchair)?: A Little Help needed standing up from a chair using your arms (e.g., wheelchair or bedside chair)?: A Little Help needed to walk in hospital room?: A Little Help needed climbing 3-5 steps with a railing? : A Little 6 Click Score: 18    End of Session Equipment Utilized During Treatment: Gait belt Activity Tolerance: Patient tolerated treatment well Patient left: with call bell/phone within reach;in bed;with bed alarm set;with family/visitor present Nurse Communication: Mobility status PT Visit Diagnosis: Unsteadiness on feet (R26.81);History of falling (Z91.81);Difficulty in walking, not elsewhere classified (R26.2);Pain Pain - Right/Left: Right Pain - part of body: Hip     Time: 1388-7195 PT Time Calculation (min) (ACUTE ONLY): 22 min  Charges:  $Gait Training: 8-22 mins                     Leighton Roach, [place]  Pager [phone] Office [place] 05/03/2020, 2:04 PM

## 2020-05-03 NOTE — Progress Notes (Signed)
Orthopaedic Trauma Service Progress Note  Patient ID: Jeremy Johnson MRN: [id number] DOB/AGE: 06/18/1946 74 y.o.  Subjective:  No complaints Pain controlled with tylenol   Planning to Dc to home once medically stable   Was able to recite hip precautions with some help   ROS As above Objective:   VITALS:   Vitals:   05/02/20 1540 05/02/20 1957 05/03/20 0409 05/03/20 0758  BP: 128/70 135/80 122/78 125/77  Pulse: 88 69 69 83  Resp:  16 16 18   Temp: 98.3 F (36.8 C) 98 F (36.7 C) 98.3 F (36.8 C) 97.9 F (36.6 C)  TempSrc: Oral Oral Oral Oral  SpO2: 100% 99% 95% 96%  Weight:      Height:        Estimated body mass index is 23.44 kg/m as calculated from the following:   Height as of this encounter: 5\' 9"  (1.753 m).   Weight as of this encounter: 72 kg.   Intake/Output      06/20 0701 - 06/21 0700 06/21 0701 - 06/22 0700   P.O.  240   IV Piggyback 50    Total Intake(mL/kg) 50 (0.7) 240 (3.3)   Other     Total Output     Net +50 +240          LABS  Results for orders placed or performed during the hospital encounter of 04/29/20 (from the past 24 hour(s))  CBC     Status: Abnormal   Collection Time: 05/03/20  4:32 AM  Result Value Ref Range   WBC 7.0 4.0 - 10.5 K/uL   RBC 2.57 (L) 4.22 - 5.81 MIL/uL   Hemoglobin 8.5 (L) 13.0 - 17.0 g/dL   HCT 25.9 (L) 39 - 52 %   MCV 100.8 (H) 80.0 - 100.0 fL   MCH 33.1 26.0 - 34.0 pg   MCHC 32.8 30.0 - 36.0 g/dL   RDW 15.9 (H) 11.5 - 15.5 %   Platelets 164 150 - 400 K/uL   nRBC 0.0 0.0 - 0.2 %  Renal function panel     Status: Abnormal   Collection Time: 05/03/20  4:32 AM  Result Value Ref Range   Sodium 136 135 - 145 mmol/L   Potassium 4.4 3.5 - 5.1 mmol/L   Chloride 98 98 - 111 mmol/L   CO2 25 22 - 32 mmol/L   Glucose, Bld 101 (H) 70 - 99 mg/dL   BUN 39 (H) 8 - 23 mg/dL   Creatinine, Ser 7.73 (H) 0.61 - 1.24 mg/dL   Calcium 8.2 (L)  8.9 - 10.3 mg/dL   Phosphorus 3.5 2.5 - 4.6 mg/dL   Albumin 2.4 (L) 3.5 - 5.0 g/dL   GFR calc non Af Amer 6 (L) >60 mL/min   GFR calc Af Amer 7 (L) >60 mL/min   Anion gap 13 5 - 15     PHYSICAL EXAM:   Gen: sitting up in chair, pleasant, NAD Lungs: unlabored  Ext:       Right Lower Extremity   Dressings stable   Scant drainage but nothing too significant  Ext warm   Swelling controlled  Motor and sensory functions intact  R leg appears slightly longer than L but he is in the chair a bit crooked    Feels that his rotation and  length are ok   + DP pulse  No DCT   Assessment/Plan: 3 Days Post-Op   Active Problems:   Essential hypertension   Hyperlipidemia   Small cell lung cancer (HCC)   Anemia in chronic kidney disease (CKD)   ESRD (end stage renal disease) on dialysis (HCC)   Hypothyroidism, unspecified   Closed displaced fracture of right femoral neck with delayed healing   Closed displaced fracture of right femoral neck (HCC)   Anti-infectives (From admission, onward)   Start     Dose/Rate Route Frequency Ordered Stop   05/01/20 1200  ceFAZolin (ANCEF) IVPB 1 g/50 mL premix       Note to Pharmacy: Tolerated intra-op ancef   1 g 100 mL/hr over 30 Minutes Intravenous Every 24 hours 04/30/20 1546 05/02/20 1217   04/30/20 0800  ceFAZolin (ANCEF) IVPB 2g/100 mL premix        2 g 200 mL/hr over 30 Minutes Intravenous To ShortStay Surgical 04/29/20 1644 04/30/20 1238    .  POD/HD#: 29  74 y/o male s/p fall with R femoral neck fracture   - Right femoral neck fracture s/p R hip hemiarthroplasty              WBAT with assistance             Posterior hip precautions             PT/OT             Ice PRN             Dressing changes as needed    - Pain management:             Minimize narcotics             Scheduled tylenol              norco prn    - ABL anemia/Hemodynamics             Monitor    - Medical issues              Per primary    - DVT/PE  prophylaxis:             Currently on heparin             Transition to eliquis x 35 days due to ESRD on dialysis    eliquis on hold due to anticipated thrombectomy today from L arm AVG    - ID:              periop abx completed post hemiarthroplasty    - Metabolic Bone Disease:             Meets criteria for fracture liaison consult                         Order placed             Fracture/mechanism suggestive of osteoporosis              Check vitamin d    - Activity:             As above   - Impediments to fracture healing:             ESRD on dialysis              History of lung CA               -  Dispo:             Therapy evals              Ortho issues stable             Will continue to follow      Weightbearing: WBAT RLE Insicional and dressing care: Daily dressing changes with mepilex starting on 05/03/2020 Orthopedic device(s): walker Showering: ok to shower and clean wounds with soap and water starting on 05/03/2020. No lotions or ointments on wound  VTE prophylaxis: eliquis 2.5mg  BID x 35 days  Pain control: tylenol, norco Bone Health/Optimization:  Follow up vitamin d levels  Follow - up plan: 10-14 days  Contact information:  [place]  MD, Ainsley Spinner PA-    Jari Pigg, PA-C [phone] (C) 05/03/2020, 9:28 AM  [facility] [place] [address] [phone] Domingo Sep (F)

## 2020-05-03 NOTE — Discharge Instructions (Addendum)
Stop taking aspirin while taking Eliquis. Restart aspirin on July 26 after Eliquis course is complete.  ================================================================  Information on my medicine - ELIQUIS (apixaban)  This medication education was reviewed with me or my healthcare representative as part of my discharge preparation.   Why was Eliquis prescribed for you? Eliquis was prescribed for you to reduce the risk of blood clots forming after orthopedic surgery.    What do You need to know about Eliquis? Take your Eliquis TWICE DAILY - one tablet in the morning and one tablet in the evening with or without food.  It would be best to take the dose about the same time each day. Continue taking Eliquis  until 06/06/20, then stop.   If you have difficulty swallowing the tablet whole please discuss with your pharmacist how to take the medication safely.  Take Eliquis exactly as prescribed by your doctor and DO NOT stop taking Eliquis without talking to the doctor who prescribed the medication.  Stopping without other medication to take the place of Eliquis may increase your risk of developing a clot.  After discharge, you should have regular check-up appointments with your healthcare provider that is prescribing your Eliquis.  What do you do if you miss a dose? If a dose of ELIQUIS is not taken at the scheduled time, take it as soon as possible on the same day and twice-daily administration should be resumed.  The dose should not be doubled to make up for a missed dose.  Do not take more than one tablet of ELIQUIS at the same time.  Important Safety Information A possible side effect of Eliquis is bleeding. You should call your healthcare provider right away if you experience any of the following: ? Bleeding from an injury or your nose that does not stop. ? Unusual colored urine (red or dark brown) or unusual colored stools (red or black). ? Unusual bruising for unknown  reasons. ? A serious fall or if you hit your head (even if there is no bleeding).  Some medicines may interact with Eliquis and might increase your risk of bleeding or clotting while on Eliquis. To help avoid this, consult your healthcare provider or pharmacist prior to using any new prescription or non-prescription medications, including herbals, vitamins, non-steroidal anti-inflammatory drugs (NSAIDs) and supplements.  This website has more information on Eliquis (apixaban): http://www.eliquis.com/eliquis/home     Orthopaedic Trauma Service Discharge Instructions   General Discharge Instructions  Orthopaedic Injuries:  Right Femoral neck fracture treated with Right hip hemiarthroplasty   WEIGHT BEARING STATUS: Weightbearing as tolerated, use walker   RANGE OF MOTION/ACTIVITY: posterior hip precautions Right hip, slowly increase activity level   TED hose/compression socks for swelling/edema control   Wound Care: daily wound care starting on 05/05/2020. See below  Discharge Wound Care Instructions  Do NOT apply any ointments, solutions or lotions to pin sites or surgical wounds.  These prevent needed drainage and even though solutions like hydrogen peroxide kill bacteria, they also damage cells lining the pin sites that help fight infection.  Applying lotions or ointments can keep the wounds moist and can cause them to breakdown and open up as well. This can increase the risk for infection. When in doubt call the office.  Surgical incisions should be dressed daily.  If any drainage is noted, use one layer of adaptic, then gauze and tape. Alternatively you may use a mepilex type dressing. This is the dressing you had on you leg during your hospital stay.  These are typically found at a medical supply store   Once the incision is completely dry and without drainage, it may be left open to air out.  Showering may begin 36-48 hours later.  Cleaning gently with soap and  water.  Traumatic wounds should be dressed daily as well.    One layer of adaptic, gauze, Kerlix, then ace wrap.  The adaptic can be discontinued once the draining has ceased    If you have a wet to dry dressing: wet the gauze with saline the squeeze as much saline out so the gauze is moist (not soaking wet), place moistened gauze over wound, then place a dry gauze over the moist one, followed by Kerlix wrap, then ace wrap.  Diet: as you were eating previously.  Can use over the counter stool softeners and bowel preparations, such as Miralax, to help with bowel movements.  Narcotics can be constipating.  Be sure to drink plenty of fluids  PAIN MEDICATION USE AND EXPECTATIONS  You have likely been given narcotic medications to help control your pain.  After a traumatic event that results in an fracture (broken bone) with or without surgery, it is ok to use narcotic pain medications to help control one's pain.  We understand that everyone responds to pain differently and each individual patient will be evaluated on a regular basis for the continued need for narcotic medications. Ideally, narcotic medication use should last no more than 6-8 weeks (coinciding with fracture healing).   As a patient it is your responsibility as well to monitor narcotic medication use and report the amount and frequency you use these medications when you come to your office visit.   We would also advise that if you are using narcotic medications, you should take a dose prior to therapy to maximize you participation.  IF YOU ARE ON NARCOTIC MEDICATIONS IT IS NOT PERMISSIBLE TO OPERATE A MOTOR VEHICLE (MOTORCYCLE/CAR/TRUCK/MOPED) OR HEAVY MACHINERY DO NOT MIX NARCOTICS WITH OTHER CNS (CENTRAL NERVOUS SYSTEM) DEPRESSANTS SUCH AS ALCOHOL   STOP SMOKING OR USING NICOTINE PRODUCTS!!!!  As discussed nicotine severely impairs your body's ability to heal surgical and traumatic wounds but also impairs bone healing.  Wounds and  bone heal by forming microscopic blood vessels (angiogenesis) and nicotine is a vasoconstrictor (essentially, shrinks blood vessels).  Therefore, if vasoconstriction occurs to these microscopic blood vessels they essentially disappear and are unable to deliver necessary nutrients to the healing tissue.  This is one modifiable factor that you can do to dramatically increase your chances of healing your injury.    (This means no smoking, no nicotine gum, patches, etc)  DO NOT USE NONSTEROIDAL ANTI-INFLAMMATORY DRUGS (NSAID'S)  Using products such as Advil (ibuprofen), Aleve (naproxen), Motrin (ibuprofen) for additional pain control during fracture healing can delay and/or prevent the healing response.  If you would like to take over the counter (OTC) medication, Tylenol (acetaminophen) is ok.  However, some narcotic medications that are given for pain control contain acetaminophen as well. Therefore, you should not exceed more than 4000 mg of tylenol in a day if you do not have liver disease.  Also note that there are may OTC medicines, such as cold medicines and allergy medicines that my contain tylenol as well.  If you have any questions about medications and/or interactions please ask your doctor/PA or your pharmacist.      ICE AND ELEVATE INJURED/OPERATIVE EXTREMITY  Using ice and elevating the injured extremity above your heart can help with swelling  and pain control.  Icing in a pulsatile fashion, such as 20 minutes on and 20 minutes off, can be followed.    Do not place ice directly on skin. Make sure there is a barrier between to skin and the ice pack.    Using frozen items such as frozen peas works well as the conform nicely to the are that needs to be iced.  USE AN ACE WRAP OR TED HOSE FOR SWELLING CONTROL  In addition to icing and elevation, Ace wraps or TED hose are used to help limit and resolve swelling.  It is recommended to use Ace wraps or TED hose until you are informed to stop.    When  using Ace Wraps start the wrapping distally (farthest away from the body) and wrap proximally (closer to the body)   Example: If you had surgery on your leg or thing and you do not have a splint on, start the ace wrap at the toes and work your way up to the thigh        If you had surgery on your upper extremity and do not have a splint on, start the ace wrap at your fingers and work your way up to the upper arm  IF YOU ARE IN A SPLINT OR CAST DO NOT [place]   If your splint gets wet for any reason please contact the office immediately. You may shower in your splint or cast as long as you keep it dry.  This can be done by wrapping in a cast cover or garbage back (or similar)  Do Not stick any thing down your splint or cast such as pencils, money, or hangers to try and scratch yourself with.  If you feel itchy take benadryl as prescribed on the bottle for itching  IF YOU ARE IN A CAM BOOT (BLACK BOOT)  You may remove boot periodically. Perform daily dressing changes as noted below.  Wash the liner of the boot regularly and wear a sock when wearing the boot. It is recommended that you sleep in the boot until told otherwise    Call office for the following:  Temperature greater than 101F  Persistent nausea and vomiting  Severe uncontrolled pain  Redness, tenderness, or signs of infection (pain, swelling, redness, odor or green/yellow discharge around the site)  Difficulty breathing, headache or visual disturbances  Hives  Persistent dizziness or light-headedness  Extreme fatigue  Any other questions or concerns you may have after discharge  In an emergency, call 911 or go to an Emergency Department at a nearby hospital    [place]: [phone]   VISIT OUR WEBSITE FOR ADDITIONAL INFORMATION: [email or web address]

## 2020-05-03 NOTE — TOC Initial Note (Signed)
Transition of Care [facility]) - Initial/Assessment Note    Patient Details  Name: Jeremy Johnson MRN: [id number] Date of Birth: August 19, 1946  [facility] CM/SW Contact:    Curlene Labrum, RN Phone Number: 05/03/2020, 1:33 PM  Clinical Narrative:                 Case management met with the patient after S/P Right femoral neck fx repair on 04/30/20.  Patient given medicare choice regarding home health services - patient with no preference.  [facility] called and patient set up with PT/OT.  [facility] called and 3:1 and RW to be delivered to the hospital room.  Patient is waiting on Left arm AV fisula to be declotted.  Right neck HD cath noted.  The patient normally has HD done at the [facility] clinic in [place], [place] on Tuesday, Thursday, and Saturday.  Expected Discharge Plan: [place] Barriers to Discharge: Continued Medical Work up   Patient Goals and CMS Choice Patient states their goals for this hospitalization and ongoing recovery are:: Patient planning to get better and go home with home health. CMS Medicare.gov Compare Post Acute Care list provided to:: Patient Choice offered to / list presented to : Patient  Expected Discharge Plan and Services Expected Discharge Plan: [place]   Discharge Planning Services: CM Consult Post Acute Care Choice: Durable Medical Equipment, Home Health Living arrangements for the past 2 months: [place]                 DME Arranged: 3-N-1, Walker rolling DME Agency: [facility] Date DME Agency Contacted: 05/03/20 Time DME Agency Contacted: [id number] Representative spoke with at DME Agency: [place] Arranged: PT, OT [place] Agency: [place] Date [place]: 05/03/20 Time [place]: [place] Representative spoke with at [place]: Meredeth Ide [facility]  Prior Living Arrangements/Services Living arrangements for the past 2 months: [place] with::  Spouse Patient language and need for interpreter reviewed:: Yes Do you feel safe going back to the place where you live?: Yes      Need for Family Participation in Patient Care: Yes (Comment) Care giver support system in place?: Yes (comment)   Criminal Activity/Legal Involvement Pertinent to Current Situation/Hospitalization: No - Comment as needed  Activities of Daily Living Home Assistive Devices/Equipment: None ADL Screening (condition at time of admission) Patient's cognitive ability adequate to safely complete daily activities?: Yes Is the patient deaf or have difficulty hearing?: No Does the patient have difficulty seeing, even when wearing glasses/contacts?: No Does the patient have difficulty concentrating, remembering, or making decisions?: No Patient able to express need for assistance with ADLs?: Yes Does the patient have difficulty dressing or bathing?: Yes Independently performs ADLs?: No Communication: Independent Walks in Home: Independent Does the patient have difficulty walking or climbing stairs?: Yes Weakness of Legs: Right Weakness of Arms/Hands: None  Permission Sought/Granted Permission sought to share information with : Case Manager Permission granted to share information with : Yes, Verbal Permission Granted     Permission granted to share info w AGENCY: [facility], [facility]  Permission granted to share info w Relationship: wife     Emotional Assessment Appearance:: Appears stated age Attitude/Demeanor/Rapport: Engaged Affect (typically observed): Accepting Orientation: : Oriented to Self, Oriented to Place, Oriented to  Time, Oriented to Situation Alcohol / Substance Use: Not Applicable Psych Involvement: No (comment)  Admission diagnosis:  End-stage renal disease on hemodialysis ([place]) [N18.6,  Z99.2] Closed fracture of neck of right femur, initial encounter ([place]) [S72.001A] Closed displaced fracture of right femoral neck ([place]) [S72.001A] Closed displaced  fracture of right femoral neck with delayed healing [S72.001G] Patient Active Problem List   Diagnosis Date Noted  . Closed displaced fracture of right femoral neck with delayed healing 04/29/2020  . Closed displaced fracture of right femoral neck ([place]) 04/29/2020  . Acute respiratory failure with hypoxia ([place]) 04/06/2020  . Acute and chronic respiratory failure with hypoxia ([place]) 04/05/2020  . Chronic obstructive pulmonary disease/emphysema   . Anaphylactic reaction due to adverse effect of correct drug or medicament properly administered, initial encounter 07/30/2019  . Hypocalcemia 05/31/2019  . Other disorders of phosphorus metabolism 05/31/2019  . Pancytopenia ([place]) 05/22/2019  . ESRD (end stage renal disease) on dialysis ([place]) 05/22/2019  . Unspecified protein-calorie malnutrition ([place]) 05/02/2019  . Coagulation defect, unspecified ([place]) 04/26/2019  . Diarrhea, unspecified 04/26/2019  . Dyspnea, unspecified 04/26/2019  . Hypokalemia 04/26/2019  . Pain, unspecified 04/26/2019  . Pruritus, unspecified 04/26/2019  . Secondary hyperparathyroidism of renal origin ([place]) 04/26/2019  . Encounter for immunization 04/26/2019  . Incisional hernia without obstruction or gangrene 04/25/2019  . Malignant neoplasm of unspecified part of unspecified bronchus or lung ([place]) 04/25/2019  . Other specified degenerative diseases of nervous system ([place]) 04/25/2019  . Anemia in chronic kidney disease 04/25/2019  . Atherosclerotic heart disease of native coronary artery without angina pectoris 04/25/2019  . Gastro-esophageal reflux disease without esophagitis 04/25/2019  . Hypothyroidism, unspecified 04/25/2019  . Acute renal failure superimposed on stage 3 chronic kidney disease ([place]) 04/16/2019  . Anemia in chronic kidney disease (CKD) 04/14/2019  . Small cell lung cancer ([place]) 02/06/2019  . Encounter for antineoplastic chemotherapy 02/06/2019  . Goals of care, counseling/discussion 02/06/2019  .  Special screening for malignant neoplasms, colon   . GERD (gastroesophageal reflux disease)   . Hyperlipidemia   . Gout   . Hypothyroidism   . Degenerative joint disease (DJD) of lumbar spine   . HYPOTHYROIDISM 02/02/2010  . HLD (hyperlipidemia) 02/02/2010  . Essential hypertension 02/02/2010  . GERD 02/02/2010  . ABDOMINAL AORTIC ANEURYSM REPAIR, HX OF 02/02/2010   PCP:  [name], MD Pharmacy:   [facility], [address]. [place] [address] Phone: [phone] Fax: [phone]  [facility] - [place], [place] - [address], [address] [place] [address] Phone: [phone] Fax: [phone]  [place], [place] [facility] [place], [address] [place], [address] [address] [place] [address] Phone: [phone] Fax: [phone]  [facility] [place], [place] [address] [place] [address] Phone: [phone] Fax: [phone]     Social Determinants of Health (SDOH) Interventions    Readmission Risk Interventions Readmission Risk Prevention Plan 05/03/2020  Transportation Screening Complete  Medication Review ([place]) Complete  PCP or Specialist appointment within 3-5 days of discharge Complete  HRI or Home Care Consult Complete  SW Recovery Care/Counseling Consult Complete  Palliative Care Screening Complete  Skilled Nursing Facility Complete  Some recent data might be hidden

## 2020-05-03 NOTE — Anesthesia Postprocedure Evaluation (Signed)
Anesthesia Post Note  Patient: GORO WENRICK  Procedure(s) Performed: ARTHROPLASTY  HIP (HEMIARTHROPLASTY) (Right Hip)     Patient location during evaluation: PACU Anesthesia Type: General Level of consciousness: awake and alert Pain management: pain level controlled Vital Signs Assessment: post-procedure vital signs reviewed and stable Respiratory status: spontaneous breathing, nonlabored ventilation, respiratory function stable and patient connected to nasal cannula oxygen Cardiovascular status: blood pressure returned to baseline and stable Postop Assessment: no apparent nausea or vomiting Anesthetic complications: no   No complications documented.  Last Vitals:  Vitals:   05/03/20 0409 05/03/20 0758  BP: 122/78 125/77  Pulse: 69 83  Resp: 16 18  Temp: 36.8 C 36.6 C  SpO2: 95% 96%    Last Pain:  Vitals:   05/03/20 0758  TempSrc: Oral  PainSc:                  Audry Pili

## 2020-05-03 NOTE — Progress Notes (Signed)
OT Cancellation Note  Patient Details Name: Jeremy Johnson MRN: [id number] DOB: [birth year]   Cancelled Treatment:    Reason Eval/Treat Not Completed: Patient at procedure or test/ unavailable. IR for thrombectomy. OT will continue efforts when patient is available.   Gloris [place] OTR/L Supplemental OT, Department of rehab services [phone]  [place] Sardo R H. 05/03/2020, 2:41 PM

## 2020-05-03 NOTE — Progress Notes (Addendum)
[facility] Progress Note   Subjective:   Patient seen in room. Leg sore but otherwise feeling well, denies SOB, CP, palpitations, abdominal pain, N/V/D. Does report poor appetite for the past year. Per wife has not had a BM since surgery but no abdominal discomfort. Planned for AVG thrombectomy today.   Objective Vitals:   05/02/20 1540 05/02/20 1957 05/03/20 0409 05/03/20 0758  BP: 128/70 135/80 122/78 125/77  Pulse: 88 69 69 83  Resp:  _0 Temp: 98.3 F (36.8 C) 98 F (36.7 C) 98.3 F (36.8 C) 97.9 F (36.6 C)  TempSrc: Oral Oral Oral Oral  SpO2: 100% 99% 95% 96%  Weight:      Height:       Physical Exam General: Well developed male, alert and in NAD Heart: RRR, no murmurs, rubs or gallops Lungs: CTA bilaterally without wheezing, rhonchi or rales Abdomen: Soft, non-tender, non-distended, +BS Extremities: No edema b/l lower extremities Dialysis Access: temp catheter R IJ, LUE AVG pulsatile but no thrill  Additional Objective Labs: Basic Metabolic Panel: Recent Labs  Lab 04/30/20 0515 04/30/20 0515 05/01/20 0248 05/02/20 0920 05/03/20 0432  NA 134*   < > 133* 136 136  K 4.6   < > 5.8* 4.3 4.4  CL 94*   < > 96* 99 98  CO2 30   < > _1 GLUCOSE 75   < > 133* 145* 101*  BUN 17   < > 35* 25* 39*  CREATININE 5.93*   < > 8.02* 5.94* 7.73*  CALCIUM 8.8*   < > 8.4* 8.6* 8.2*  PHOS 4.1  --   --   --  3.5   < > = values in this interval not displayed.   Liver Function Tests: Recent Labs  Lab 04/30/20 0515 05/03/20 0432  ALBUMIN 3.1* 2.4*   CBC: Recent Labs  Lab 04/29/20 0712 04/29/20 0712 04/30/20 0515 04/30/20 0515 05/01/20 0248 05/02/20 0920 05/03/20 0432  WBC 7.9   < > 5.8   < > 6.4 8.0 7.0  NEUTROABS 5.7  --   --   --   --   --   --   HGB 10.3*   < > 10.8*   < > 9.9* 9.6* 8.5*  HCT 31.0*   < > 31.8*   < > 29.3* 29.0* 25.9*  MCV 100.0  --  99.1  --  99.0 100.3* 100.8*  PLT 183   < > 168   < > 155 185 164   < > = values in  this interval not displayed.   Blood Culture    Component Value Date/Time   SDES BLOOD LEFT ARM 05/22/2019 1559   SDES BLOOD LEFT ARM 05/22/2019 1559   SPECREQUEST  05/22/2019 1559    BOTTLES DRAWN AEROBIC AND ANAEROBIC Blood Culture results may not be optimal due to an inadequate volume of blood received in culture bottles   SPECREQUEST  05/22/2019 1559    BOTTLES DRAWN AEROBIC AND ANAEROBIC Blood Culture adequate volume   CULT  05/22/2019 1559    NO GROWTH 5 DAYS Performed at [facility], [place] [address], [place], [place] [address]    CULT  05/22/2019 1559    NO GROWTH 5 DAYS Performed at [facility], [place] [address], [place], [place] [address]    REPTSTATUS 05/27/2019 FINAL 05/22/2019 1559   REPTSTATUS 05/27/2019 FINAL 05/22/2019 1559    Studies/Results: IR Fluoro Guide CV Line Right  Result Date: 05/03/2020 INDICATION: End-stage renal disease EXAM: IMAGE GUIDED TEMPORARY HEMODIALYSIS CATHETER MEDICATIONS: None ANESTHESIA/SEDATION: None FLUOROSCOPY TIME:  Fluoroscopy Time: 0 minutes 12 seconds COMPLICATIONS: None PROCEDURE: Informed written consent was obtained from the patient's family after a discussion of the risks, benefits, and alternatives to treatment. Questions regarding the procedure were encouraged and answered. The right neck was prepped with chlorhexidine in a sterile fashion, and a sterile drape was applied covering the operative field. Maximum barrier sterile technique with sterile gowns and gloves were used for the procedure. A timeout was performed prior to the initiation of the procedure. A micropuncture kit was utilized to access the right internal jugular vein under direct, real-time ultrasound guidance after the overlying soft tissues were anesthetized with 1% lidocaine with epinephrine. Ultrasound image documentation was performed. The microwire was kinked to measure appropriate catheter length. A stiff glidewire was advanced to the level of the IVC. A 16  cm hemodialysis catheter was then placed over the wire. Final catheter positioning was confirmed and documented with a spot radiographic image. The catheter aspirates and flushes normally. The catheter was flushed with appropriate volume heparin dwells. Dressings were applied. The patient tolerated the procedure well without immediate post procedural complication. IMPRESSION: Status post image guided temporary hemodialysis catheter placement. Signed, Dulcy Fanny. Dellia Nims, RPVI [facility] Electronically Signed   By: Corrie Mckusick D.O.   On: 05/01/2020 13:56   IR US Guide Vasc Access Right  Result Date: 05/01/2020 INDICATION: End-stage renal disease EXAM: IMAGE GUIDED TEMPORARY HEMODIALYSIS CATHETER MEDICATIONS: None ANESTHESIA/SEDATION: None FLUOROSCOPY TIME:  Fluoroscopy Time: 0 minutes 12 seconds COMPLICATIONS: None PROCEDURE: Informed written consent was obtained from the patient's family after a discussion of the risks, benefits, and alternatives to treatment. Questions regarding the procedure were encouraged and answered. The right neck was prepped with chlorhexidine in a sterile fashion, and a sterile drape was applied covering the operative field. Maximum barrier sterile technique with sterile gowns and gloves were used for the procedure. A timeout was performed prior to the initiation of the procedure. A micropuncture kit was utilized to access the right internal jugular vein under direct, real-time ultrasound guidance after the overlying soft tissues were anesthetized with 1% lidocaine with epinephrine. Ultrasound image documentation was performed. The microwire was kinked to measure appropriate catheter length. A stiff glidewire was advanced to the level of the IVC. A 16 cm hemodialysis catheter was then placed over the wire. Final catheter positioning was confirmed and documented with a spot radiographic image. The catheter aspirates and  flushes normally. The catheter was flushed with appropriate volume heparin dwells. Dressings were applied. The patient tolerated the procedure well without immediate post procedural complication. IMPRESSION: Status post image guided temporary hemodialysis catheter placement. Signed, Dulcy Fanny. Dellia Nims, RPVI [facility] Electronically Signed   By: Corrie Mckusick D.O.   On: 05/01/2020 13:56   Medications:  . acetaminophen  500 mg Oral Q8H  . allopurinol  100 mg Oral q AM  . amLODipine  10 mg Oral QHS  . [START ON 05/04/2020] apixaban  2.5 mg Oral BID  . cetirizine  5 mg Oral QPM  . Chlorhexidine Gluconate Cloth  6 each Topical Daily  . cloNIDine  0.1 mg Oral BID  . docusate sodium  100 mg Oral BID  . feeding supplement (NEPRO CARB STEADY)  237 mL Oral BID BM  . levothyroxine  175 mcg Oral QAC breakfast  . metoprolol  tartrate  25 mg Oral BID  . multivitamin  1 tablet Oral q AM  . pantoprazole  80 mg Oral Daily  . pramipexole  0.25 mg Oral QHS  . rosuvastatin  10 mg Oral QHS    Dialysis Orders: TTS [place]   4h  400/A1.5  72kg   3K/2.5 bath  AVG   ? heparin  mircera 50 mcg every 4 weeks - last on 04/20/20   venofer 50 mg weekly  Calcitriol 0.75 mcg three times a week with HD  Assessment/Plan: 1. Right hip fx s/p mechanical fall. S/p R hemiarthroplasty 6/18. Per ortho. Pain is currently well controlled. Avoid morphine in ESRD patient.  2. ESRD - HD TTS. AVG clotted 6/19. Temp cath placed per IR 6/19 followed by HD on schedule - plans for thrombectomy. Next HD 6/22 on schedule.  3. HTN/volume-  BP controlled. OP EDW just lowered. Net UF 2L with pre HD wt 72kg on 6/19. Currently at EDW and appears euvolemic on exam.  4. Anemia-  Hb down to 8.5 today. Not currently on ESA as outpatient. History of small cell lung cancer but has received ESA as outpatient previously. Will clear with oncology before restarting.  5. MBD of CKD -   Ca/Phos at goal. Continue calcitriol - no binder on med list but does not need for now 6. Nutrition - Renal diet/prot supp when eating   Anice Paganini, PA-C 05/03/2020, 8:57 AM  [facility] Pager: [phone]  Patient seen and examined, agree with above note with above modifications. No c/o's-  Feels like he is ready to go home.  For AVF declot today ?  Then for routine HD tomorrow on schedule to make sure can use AVF-  Consider using ESA-  Resume calcitriol  Corliss Parish, MD 05/03/2020

## 2020-05-04 ENCOUNTER — Telehealth: Payer: Self-pay | Admitting: Medical Oncology

## 2020-05-04 LAB — RENAL FUNCTION PANEL
Albumin: 2.4 g/dL — ABNORMAL LOW (ref 3.5–5.0)
Anion gap: 15 (ref 5–15)
BUN: 60 mg/dL — ABNORMAL HIGH (ref 8–23)
CO2: 22 mmol/L (ref 22–32)
Calcium: 8.4 mg/dL — ABNORMAL LOW (ref 8.9–10.3)
Chloride: 97 mmol/L — ABNORMAL LOW (ref 98–111)
Creatinine, Ser: 10.46 mg/dL — ABNORMAL HIGH (ref 0.61–1.24)
GFR calc Af Amer: 5 mL/min — ABNORMAL LOW (ref >60)
GFR calc non Af Amer: 4 mL/min — ABNORMAL LOW (ref >60)
Glucose, Bld: 182 mg/dL — ABNORMAL HIGH (ref 70–99)
Phosphorus: 4.4 mg/dL (ref 2.5–4.6)
Potassium: 4.6 mmol/L (ref 3.5–5.1)
Sodium: 134 mmol/L — ABNORMAL LOW (ref 135–145)

## 2020-05-04 LAB — VITAMIN D 25 HYDROXY (VIT D DEFICIENCY, FRACTURES): Vit D, 25-Hydroxy: 18.48 ng/mL — ABNORMAL LOW (ref 30–100)

## 2020-05-04 LAB — CBC
HCT: 24.6 % — ABNORMAL LOW (ref 39.0–52.0)
Hemoglobin: 8.2 g/dL — ABNORMAL LOW (ref 13.0–17.0)
MCH: 33.1 pg (ref 26.0–34.0)
MCHC: 33.3 g/dL (ref 30.0–36.0)
MCV: 99.2 fL (ref 80.0–100.0)
Platelets: 194 10*3/uL (ref 150–400)
RBC: 2.48 MIL/uL — ABNORMAL LOW (ref 4.22–5.81)
RDW: 15.7 % — ABNORMAL HIGH (ref 11.5–15.5)
WBC: 6.5 10*3/uL (ref 4.0–10.5)
nRBC: 0 % (ref 0.0–0.2)

## 2020-05-04 MED ORDER — HYDROCODONE-ACETAMINOPHEN 5-325 MG PO TABS: 1.0000 | ORAL_TABLET | Freq: Three times a day (TID) | ORAL | 0 refills | Status: DC | PRN

## 2020-05-04 MED ORDER — CALCITRIOL 0.25 MCG PO CAPS
ORAL_CAPSULE | ORAL | Status: AC
  Filled 2020-05-04: qty 3

## 2020-05-04 MED ORDER — APIXABAN 2.5 MG PO TABS: 2.5000 mg | ORAL_TABLET | Freq: Two times a day (BID) | ORAL | 0 refills | Status: DC

## 2020-05-04 MED FILL — ELIQUIS 2.5 MG TABLET: 2.5 | 30 days supply | Qty: 60 | Fill #0

## 2020-05-04 MED FILL — HYDROCODON-APAP 5-325: 5-325 | 6 days supply | Qty: 40 | Fill #0

## 2020-05-04 NOTE — Progress Notes (Signed)
Jeremy Johnson to be discharged home per MD order.  Discussed prescriptions, post op care, and follow up appointments with the patient. Prescriptions given to patient & delivered by our transitions of care pharmacy, medication list explained in detail. Patient and wife verbalized understanding.  Allergies as of 05/04/2020      Reactions   Advair Hfa [fluticasone-salmeterol] Other (See Comments)   Developed thrush, although the mouth WAS being rinsed as directed   Penicillins Rash   Has patient had a PCN reaction causing immediate rash, facial/tongue/throat swelling, SOB or lightheadedness with hypotension: No Has patient had a PCN reaction causing severe rash involving mucus membranes or skin necrosis: No Has patient had a PCN reaction that required hospitalization: No Has patient had a PCN reaction occurring within the last 10 years: No If all of the above answers are "NO", then may proceed with Cephalosporin use.      Medication List    TAKE these medications   acetaminophen 325 MG tablet Commonly known as: TYLENOL Take 2 tablets (650 mg total) by mouth every 6 (six) hours as needed for mild pain (or Fever >/= 101).   albuterol 108 (90 Base) MCG/ACT inhaler Commonly known as: VENTOLIN HFA Inhale 2 puffs into the lungs every 6 (six) hours as needed for wheezing or shortness of breath.   allopurinol 100 MG tablet Commonly known as: ZYLOPRIM Take 1 tablet (100 mg total) by mouth daily. What changed: when to take this   amLODipine 10 MG tablet Commonly known as: NORVASC Take 10 mg by mouth at bedtime.   apixaban 2.5 MG Tabs tablet Commonly known as: ELIQUIS Take 1 tablet (2.5 mg total) by mouth 2 (two) times daily.   aspirin EC 81 MG tablet Take 81 mg by mouth at bedtime.   cloNIDine 0.1 MG tablet Commonly known as: CATAPRES Take 0.1 mg by mouth in the morning and at bedtime.   Darbepoetin Alfa 100 MCG/0.5ML Sosy injection Commonly known as: ARANESP Inject 0.5 mLs (100  mcg total) into the vein every Thursday with hemodialysis.   Dialyvite 800-Zinc 15 0.8 MG Tabs Take 1 tablet by mouth daily.   HYDROcodone-acetaminophen 5-325 MG tablet Commonly known as: NORCO/VICODIN Take 1-2 tablets by mouth every 8 (eight) hours as needed for moderate pain or severe pain.   iron sucrose in sodium chloride 0.9 % 100 mL 50 mg.   levothyroxine 175 MCG tablet Commonly known as: SYNTHROID Take 175 mcg by mouth daily before breakfast.   lidocaine-prilocaine cream Commonly known as: EMLA Apply 1 application topically Every Tuesday,Thursday,and Saturday with dialysis.   metoprolol tartrate 50 MG tablet Commonly known as: LOPRESSOR Take 25 mg by mouth 2 (two) times daily.   MIRCERA IJ Inject 30 mg into the vein every 28 (twenty-eight) days.   omeprazole 40 MG capsule Commonly known as: PRILOSEC Take 40 mg by mouth in the morning.   pramipexole 0.25 MG tablet Commonly known as: MIRAPEX Take 0.25 mg by mouth at bedtime.   rosuvastatin 10 MG tablet Commonly known as: CRESTOR Take 10 mg by mouth at bedtime.            Durable Medical Equipment  (From admission, onward)         Start     Ordered   05/03/20 1624  For home use only DME 3 n 1  Once        05/03/20 1624   05/03/20 1624  For home use only DME Walker rolling  Once  Question Answer Comment  Walker: With [place]   Patient needs a walker to treat with the following condition History of right hip hemiarthroplasty      05/03/20 1624   05/02/20 1721  For home use only DME Walker rolling  Once       Question Answer Comment  Walker: With [place]   Patient needs a walker to treat with the following condition Hip fracture ([place])      05/02/20 1720   05/02/20 1720  For home use only DME 3 n 1  Once        05/02/20 1720          Vitals:   05/04/20 1152 05/04/20 1345  BP: 126/65 120/65  Pulse: 77 82  Resp: 16 17  Temp:  98.5 F (36.9 C)  SpO2: 97% 97%    IV  catheters discontinued, Dressings and pressure applied. Patient was treated for right hip pain prior to leaving with 1 vicodin. No other complaints noted.  An After Visit Summary was printed and given to the patient. Patient escorted via Wheelchair, and Discharged home via private auto with wife and daughter.  [place] 05/04/2020 5:13 PM

## 2020-05-04 NOTE — Progress Notes (Signed)
Bruit and thrill present during AVG assessment. AVG cannulated successfully with no complications. Flow is good and prescribed 400 BFR was achieved.

## 2020-05-04 NOTE — Progress Notes (Signed)
PT Cancellation Note  Patient Details Name: Jeremy Johnson MRN: [id number] DOB: 09-Mar-1946   Cancelled Treatment:    Reason Eval/Treat Not Completed: Patient at procedure or test/unavailable, HD. Will check back as time allows.   Leighton Roach, PT  Acute Rehab Services  Pager [phone] Office [place] 05/04/2020, 8:50 AM

## 2020-05-04 NOTE — Telephone Encounter (Signed)
There is always a risk for disease progression and even death on patient with cancer who received ESA.  Thank you.

## 2020-05-04 NOTE — Procedures (Signed)
Patient was seen on dialysis and the procedure was supervised.  BFR 400  Via AVG BP is  136/66.   Patient appears to be tolerating treatment well  Louis Meckel 05/04/2020

## 2020-05-04 NOTE — TOC Benefit Eligibility Note (Signed)
Transition of Care [facility]) Benefit Eligibility Note    Patient Details  Name: Jeremy Johnson MRN: [id number] Date of Birth: 05-May-1946   Medication/Dose: Eliquis 2.5mg  bid  Covered?: Yes  Tier: 3 Drug  Prescription Coverage Preferred Pharmacy: any retail pharmacy  Spoke with Person/Company/Phone Number:: [employer]  Co-Pay: $47.00 for 30 day retail  Prior Approval: No          Delorse Lek Phone Number: 05/04/2020, 10:48 AM

## 2020-05-04 NOTE — Progress Notes (Signed)
Nutrition Follow-up  DOCUMENTATION CODES:   Not applicable  INTERVENTION:   -D/c Nepro, due to poor acceptance -Magic cup TID with meals, each supplement provides 290 kcal and 9 grams of protein -Continue renal MVI daily  NUTRITION DIAGNOSIS:   Increased nutrient needs related to chronic illness, hip fracture (ESRD on HD; anticipated surgical managment of right hip fracture) as evidenced by estimated needs.  Ongoing  GOAL:   Patient will meet greater than or equal to 90% of their needs  Progressing   MONITOR:   PO intake, Supplement acceptance, Diet advancement, Labs, Weight trends, Skin, I & O's  REASON FOR ASSESSMENT:   Consult Hip fracture protocol  ASSESSMENT:   74 year old male with past medical history of ESRD on HD, CAD, COPD, HTN, HLD, small cell lung cancer, hypothyroidism presented after mechanical fall while getting dressed to go to dialysis admitted for right femoral neck fracture.  6/18- s/p R hip hemiarthroplasty  6/19- s/p right IJ approach triple lumen HD catheter16cm 6/21- s/p Thrombectomy of left upper arm dialysis graft with angioplasty  Reviewed I/O's: +240 ml x 24 hours and -2.9 L since admission  Plan to remove temporary HD catheter today. Per MD, will discharge home after removal of catheter.   Pt out of room at time of visit. Unable to speak with pt or complete nutrition-focused physical exam at this time. No family present to provide additional history.   Pt with good appetite; noted meal completion 75%. He is refusing Nepro supplements.   Labs reviewed: Na: 134.   Diet Order:   Diet Order            Diet renal with fluid restriction Fluid restriction: 1200 mL Fluid; Room service appropriate? Yes; Fluid consistency: Thin  Diet effective now                 EDUCATION NEEDS:   No education needs have been identified at this time  Skin:  Skin Assessment: Skin Integrity Issues: Skin Integrity Issues:: Incisions Incisions: rt  leg  Last BM:  Unknown  Height:   Ht Readings from Last 1 Encounters:  04/30/20 5\' 9"  (1.753 m)    Weight:   Wt Readings from Last 1 Encounters:  05/04/20 72 kg    Ideal Body Weight:  72.7 kg  BMI:  Body mass index is 23.44 kg/m.  Estimated Nutritional Needs:   Kcal:  2050-2250  Protein:  105-120 grams  Fluid:  1000 ml + UOP    Loistine Chance, RD, LDN, [place] Registered Dietitian II Certified Diabetes Care and Education Specialist Please refer to [facility] for RD and/or RD on-call/weekend/after hours pager

## 2020-05-04 NOTE — Telephone Encounter (Addendum)
[facility] .Can he start back on his ESA?   Per Julien Nordmann , I told [place] PA that  it was okay -pt is under observation. I contacted Aldona Bar and told her this was incorrect information.

## 2020-05-04 NOTE — Progress Notes (Addendum)
[facility] Progress Note   Subjective:   Pt seen on HD. S/p thrombectomy yesterday, AVG running well today with BFR 400. Feeling well, denies SOB, CP, palpitations, abdominal pain, N/V/D.   Objective Vitals:   05/04/20 0338 05/04/20 0738 05/04/20 0752 05/04/20 0830  BP: 120/76 119/68 122/68 (!) 94/57  Pulse: 71 85 81 70  Resp: 14 16 16 16   Temp: 97.6 F (36.4 C) 97.8 F (36.6 C)    TempSrc: Oral Oral    SpO2: 95% 95%    Weight:  72 kg    Height:       Physical Exam General: Well developed male, alert and in NAD Heart: RRR, no murmurs, rubs or gallops Lungs: CTA bilaterally without wheezing, rhonchi or rales Abdomen: Soft, non-tender, non-distended, +BS Extremities: No edema b/l lower extremities Dialysis Access: temp catheter R IJ, LUE AVG currently accessed  Additional Objective Labs: Basic Metabolic Panel: Recent Labs  Lab 04/30/20 0515 04/30/20 0515 05/01/20 0248 05/02/20 0920 05/03/20 0432  NA 134*   < > 133* 136 136  K 4.6   < > 5.8* 4.3 4.4  CL 94*   < > 96* 99 98  CO2 30   < > 24 25 25   GLUCOSE 75   < > 133* 145* 101*  BUN 17   < > 35* 25* 39*  CREATININE 5.93*   < > 8.02* 5.94* 7.73*  CALCIUM 8.8*   < > 8.4* 8.6* 8.2*  PHOS 4.1  --   --   --  3.5   < > = values in this interval not displayed.   Liver Function Tests: Recent Labs  Lab 04/30/20 0515 05/03/20 0432  ALBUMIN 3.1* 2.4*   CBC: Recent Labs  Lab 04/29/20 0712 04/29/20 0712 04/30/20 0515 04/30/20 0515 05/01/20 0248 05/02/20 0920 05/03/20 0432  WBC 7.9   < > 5.8   < > 6.4 8.0 7.0  NEUTROABS 5.7  --   --   --   --   --   --   HGB 10.3*   < > 10.8*   < > 9.9* 9.6* 8.5*  HCT 31.0*   < > 31.8*   < > 29.3* 29.0* 25.9*  MCV 100.0  --  99.1  --  99.0 100.3* 100.8*  PLT 183   < > 168   < > 155 185 164   < > = values in this interval not displayed.   Blood Culture    Component Value Date/Time   SDES BLOOD LEFT ARM 05/22/2019 1559   SDES BLOOD LEFT ARM 05/22/2019 1559    SPECREQUEST  05/22/2019 1559    BOTTLES DRAWN AEROBIC AND ANAEROBIC Blood Culture results may not be optimal due to an inadequate volume of blood received in culture bottles   SPECREQUEST  05/22/2019 1559    BOTTLES DRAWN AEROBIC AND ANAEROBIC Blood Culture adequate volume   CULT  05/22/2019 1559    NO GROWTH 5 DAYS Performed at [facility], [place] [address], [place], [place] [address]    CULT  05/22/2019 1559    NO GROWTH 5 DAYS Performed at [facility], [place] [address], [place], [place] [address]    REPTSTATUS 05/27/2019 FINAL 05/22/2019 1559   REPTSTATUS 05/27/2019 FINAL 05/22/2019 1559    Studies/Results: IR US Guide Vasc Access Left  Result Date: 05/03/2020 CLINICAL DATA:  Thrombosed left upper arm brachial artery to brachial vein dialysis graft. EXAM: 1. ULTRASOUND GUIDANCE FOR VASCULAR ACCESS OF  DIALYSIS FISTULA. 2. DIALYSIS FISTULA DECLOT PROCEDURE WITH TWO SEPARATE GRAFT ACCESS SITES. 3. VENOUS ANGIOPLASTY OF DIALYSIS GRAFT AND VENOUS ANASTOMOSIS ANESTHESIA/SEDATION: 1.5 mg IV Versed; 50 mcg IV Fentanyl. Total Moderate Sedation Time 68 minutes. The patient's level of consciousness and physiologic status were continuously monitored during the procedure by Radiology nursing. CONTRAST:  60mL OMNIPAQUE IOHEXOL 300 MG/ML  SOLN MEDICATIONS: 3000 U IV heparin FLUOROSCOPY TIME:  3 minutes and 42 seconds.  15.0 mGy. PROCEDURE: The procedure, risks, benefits, and alternatives were explained to the patient. Questions regarding the procedure were encouraged and answered. The patient understands and consents to the procedure. A time-out was performed prior to initiating the procedure. The left upper arm dialysis graft was prepped with chlorhexidine in a sterile fashion, and a sterile drape was applied covering the operative field. A sterile gown and sterile gloves were used for the procedure. Local anesthesia was provided with 1% Lidocaine. Preliminary ultrasound was performed of the  dialysis graft. Both antegrade and retrograde graft access was performed with micropuncture sets under direct ultrasound guidance. Ultrasound image documentation was performed. 6-French antegrade and retrograde sheaths were placed. A diagnostic catheter was advanced and contrast injection performed at the level of patent venous outflow. Outflow venography was also performed via the catheter. Balloon angioplasty was performed at the level of the venous anastomosis and dialysis graft with a 7 mm x 4 cm Conquest balloon. Mechanical thrombectomy was performed within the dialysis graft with the Angiojet device. Thrombectomy across the arterial anastomosis was then performed with a 4-French Fogarty balloon catheter. Several passes were made with the Fogarty catheter. Suction thrombectomy was then performed through the antegrade sheath. Graft patency was reassessed with angiography. Additional 7 mm balloon angioplasty was performed across the venous anastomosis and within a specific segment of the dialysis graft. Upon completion of the procedure, both sheaths were removed and hemostasis obtained with manual compression. COMPLICATIONS: None FINDINGS: Ultrasound confirms thrombosis of the graft. After mechanical thrombectomy and thrombectomy across the arterial anastomosis, flow within the dialysis graft was re-established. There was a persistent area of irregularity within the graft lumen roughly at the juncture of the upper and middle thirds of the graft along the superior most puncture sites. Despite repeated dilatation with a 7 mm balloon, focal segmental irregularity and luminal narrowing of this segment of the graft was present over a length of approximately 1-1.5 cm. By ultrasound, focal graft irregularity is also seen in this area with an appearance suggestive of graft damage/infolding. Stenosis at the venous anastomosis of the dialysis graft was also present which initially appeared to be greater than 70% in  caliber. This responded well to balloon angioplasty with final imaging demonstrating no significant residual stenosis at the venous anastomosis. An appropriate short covered stent that can eventually be punctured with a needle was not available to repair the focal area of graft irregularity/infolding. Only longer covered stents were available which would have covered to much of the usable graft length currently. IMPRESSION: Successful declot procedure to reestablish flow in an occluded left upper arm dialysis graft. There were 2 abnormalities uncovered related to the graft that likely contributed to thrombosis. A greater than 70% stenosis at the venous anastomosis showed significant improvement after 7 mm balloon angioplasty with no residual stenosis remaining. There was an area of resistant irregularity and narrowing within the upper graft lumen over a short segment as described above. This has the appearance of focal graft damage/infolding likely related to prior puncture trauma. An appropriate short covered  stent was not available to repair the focal area of graft irregularity. ACCESS: The graft remains amenable to percutaneous intervention. Given appearance of the focal area of resistant graft irregularity and narrowing, consider vascular surgical consultation for graft repair/revision should there be future difficulty with graft flow or recurrent thrombosis. Electronically Signed   By: Aletta Edouard M.D.   On: 05/03/2020 17:09   IR THROMBECTOMY AV FISTULA W/THROMBOLYSIS/PTA INC/SHUNT/IMG LEFT  Result Date: 05/03/2020 CLINICAL DATA:  Thrombosed left upper arm brachial artery to brachial vein dialysis graft. EXAM: 1. ULTRASOUND GUIDANCE FOR VASCULAR ACCESS OF DIALYSIS FISTULA. 2. DIALYSIS FISTULA DECLOT PROCEDURE WITH TWO SEPARATE GRAFT ACCESS SITES. 3. VENOUS ANGIOPLASTY OF DIALYSIS GRAFT AND VENOUS ANASTOMOSIS ANESTHESIA/SEDATION: 1.5 mg IV Versed; 50 mcg IV Fentanyl. Total Moderate Sedation Time 68  minutes. The patient's level of consciousness and physiologic status were continuously monitored during the procedure by Radiology nursing. CONTRAST:  60mL OMNIPAQUE IOHEXOL 300 MG/ML  SOLN MEDICATIONS: 3000 U IV heparin FLUOROSCOPY TIME:  3 minutes and 42 seconds.  15.0 mGy. PROCEDURE: The procedure, risks, benefits, and alternatives were explained to the patient. Questions regarding the procedure were encouraged and answered. The patient understands and consents to the procedure. A time-out was performed prior to initiating the procedure. The left upper arm dialysis graft was prepped with chlorhexidine in a sterile fashion, and a sterile drape was applied covering the operative field. A sterile gown and sterile gloves were used for the procedure. Local anesthesia was provided with 1% Lidocaine. Preliminary ultrasound was performed of the dialysis graft. Both antegrade and retrograde graft access was performed with micropuncture sets under direct ultrasound guidance. Ultrasound image documentation was performed. 6-French antegrade and retrograde sheaths were placed. A diagnostic catheter was advanced and contrast injection performed at the level of patent venous outflow. Outflow venography was also performed via the catheter. Balloon angioplasty was performed at the level of the venous anastomosis and dialysis graft with a 7 mm x 4 cm Conquest balloon. Mechanical thrombectomy was performed within the dialysis graft with the Angiojet device. Thrombectomy across the arterial anastomosis was then performed with a 4-French Fogarty balloon catheter. Several passes were made with the Fogarty catheter. Suction thrombectomy was then performed through the antegrade sheath. Graft patency was reassessed with angiography. Additional 7 mm balloon angioplasty was performed across the venous anastomosis and within a specific segment of the dialysis graft. Upon completion of the procedure, both sheaths were removed and hemostasis  obtained with manual compression. COMPLICATIONS: None FINDINGS: Ultrasound confirms thrombosis of the graft. After mechanical thrombectomy and thrombectomy across the arterial anastomosis, flow within the dialysis graft was re-established. There was a persistent area of irregularity within the graft lumen roughly at the juncture of the upper and middle thirds of the graft along the superior most puncture sites. Despite repeated dilatation with a 7 mm balloon, focal segmental irregularity and luminal narrowing of this segment of the graft was present over a length of approximately 1-1.5 cm. By ultrasound, focal graft irregularity is also seen in this area with an appearance suggestive of graft damage/infolding. Stenosis at the venous anastomosis of the dialysis graft was also present which initially appeared to be greater than 70% in caliber. This responded well to balloon angioplasty with final imaging demonstrating no significant residual stenosis at the venous anastomosis. An appropriate short covered stent that can eventually be punctured with a needle was not available to repair the focal area of graft irregularity/infolding. Only longer covered stents were available which would have  covered to much of the usable graft length currently. IMPRESSION: Successful declot procedure to reestablish flow in an occluded left upper arm dialysis graft. There were 2 abnormalities uncovered related to the graft that likely contributed to thrombosis. A greater than 70% stenosis at the venous anastomosis showed significant improvement after 7 mm balloon angioplasty with no residual stenosis remaining. There was an area of resistant irregularity and narrowing within the upper graft lumen over a short segment as described above. This has the appearance of focal graft damage/infolding likely related to prior puncture trauma. An appropriate short covered stent was not available to repair the focal area of graft irregularity.  ACCESS: The graft remains amenable to percutaneous intervention. Given appearance of the focal area of resistant graft irregularity and narrowing, consider vascular surgical consultation for graft repair/revision should there be future difficulty with graft flow or recurrent thrombosis. Electronically Signed   By: Aletta Edouard M.D.   On: 05/03/2020 17:09   Medications:  . acetaminophen  500 mg Oral Q8H  . allopurinol  100 mg Oral q AM  . amLODipine  10 mg Oral QHS  . apixaban  2.5 mg Oral BID  . calcitRIOL  0.75 mcg Oral Q T,Th,Sa-HD  . cetirizine  5 mg Oral QPM  . Chlorhexidine Gluconate Cloth  6 each Topical Daily  . cloNIDine  0.1 mg Oral BID  . docusate sodium  100 mg Oral BID  . feeding supplement (NEPRO CARB STEADY)  237 mL Oral BID BM  . levothyroxine  175 mcg Oral QAC breakfast  . metoprolol tartrate  25 mg Oral BID  . multivitamin  1 tablet Oral q AM  . pantoprazole  80 mg Oral Daily  . polyethylene glycol  17 g Oral Daily  . pramipexole  0.25 mg Oral QHS  . rosuvastatin  10 mg Oral QHS    Dialysis Orders: TTS [place] 4h 400/A1.5 72kg 3K/2.5 bath AVG ? heparin mircera 50 mcg every 4 weeks - last on 04/20/20  venofer 50 mg weekly Calcitriol 0.75 mcg three times a week with HD  Assessment/Plan: 1. Right hip fx s/p mechanical fall- S/p R hemiarthroplasty 6/18.Per ortho.Pain is currently well controlled. Avoid morphine in ESRD patient.  2. ESRD - HD TTS.AVG clotted 6/19. Temp cath placed per IR 6/19 followed by HD on schedule, then thrombectomy on 6/21. AVG running well today, per IR notes may need focal graft revision/regmental replacement by vascular if any future issues with access. Needs temp cath removed prior to discharge- will consult IR to remove today.  I actually think that IV team can remove non tunneled caths  3. HTN/volume- BP controlled. OP EDW just lowered. Net UF 2L with pre HD wt 72kg on 6/19.Currently at EDW and appears euvolemic on exam.   4. Anemia- Hb down to 8.5 6/21,  AM labs pending. History of small cell lung cancer but has received ESA as outpatient previously. Will clear with oncology before restarting.  5.MBD of CKD - Ca/Phos at goal. Continue calcitriol - no binder on med list but does not need for now 6. Nutrition - Renal diet/prot supp when eating   Anice Paganini, PA-C 05/04/2020, 9:03 AM  [facility] Pager: [phone]  Patient seen and examined, agree with above note with above modifications. Seen on HD-  Using AVG without issue-  Plan is to take out temp cath and for pt to be discharged today-  He has no c/o's  Corliss Parish, MD 05/04/2020

## 2020-05-04 NOTE — Discharge Summary (Signed)
Physician Discharge Summary  Jeremy BAUSERMAN BJS:[id number] DOB: 1946-05-06 DOA: 04/29/2020  PCP: [name], MD  Admit date: 04/29/2020 Discharge date: 05/04/2020  Admitted From: Home Disposition:  Home  Recommendations for Outpatient Follow-up:  1. Follow up with PCP in 1-2 weeks 2. Follow up with Orthopedic Surgery as scheduled  Home Health:PT, OT  Equipment/Devices: 3 in 1 commode, rolling walker    Discharge Condition:Stable CODE STATUS:Full Diet recommendation: Renal   Brief/Interim Summary: 74 y.o.malewith medical history ofESRD(T-T-S)at [facility], coronary artery disease, COPD, hypertension, hyperlipidemia, small cell lung cancer, hypothyroidism presenting after mechanical fall while getting dressed to go to dialysis in the early a.m. 04/29/2020. The patient denies any syncope. The patient had been in his usual state of health until his mechanical fall. He had denied any fevers, chills, headache, chest pain, shortness breath, coughing, hemoptysis, nausea, vomiting, diarrhea, abdominal pain, hematochezia, melena, dizziness. The patient has a remote history of MI back in 1997 but has not seen a cardiologist for over 10 years after he was released by Dr. Lia Foyer for being medically stable. The patient denies any history of stroke. He is compliant with all his medications. He was last dialyzed on 04/27/2020 and had a full dialysis session. In the emergency department, the patient was afebrile hemodynamically stable with oxygen saturation 97% on room air. WBC 7.9, hemoglobin 10.3, platelets 183,000. Potassium 4.6, sodium 135, serum creatinine 8.36. Nephrology and orthopedics were consulted to assist with management.  Discharge Diagnoses:  Active Problems:   Essential hypertension   Hyperlipidemia   Small cell lung cancer (HCC)   Anemia in chronic kidney disease (CKD)   ESRD (end stage renal disease) on dialysis (HCC)   Hypothyroidism, unspecified   Closed displaced  fracture of right femoral neck with delayed healing   Closed displaced fracture of right femoral neck (HCC)   Right femoral neck fracture -Orthopedics consulted and pt now s/p R hemiarthoplasty 6/18 -Cont with post-op care per Orthopedic Surgey -Therapy recs noted for HHPTOT with 3 in 1 commode and rolling walker, ordered  ESRD -Dialyzes Tuesday, Thursday, Saturday -Nephrology following -HD graft clotted and currently has temporary HD cath -Pt underwent thrombectomy 6/21 and tolerated HD on 6/22 -Temporary HD cath removed 6/22  Coronary artery disease -No chest pain presently -Aspirin was held perioperatively  in anticipation for surgery -Stable at this time. Now on eliquis for post-op prophylaxis per Ortho. Once completing course of eliquis, would resume ASA as tolerated  Non-small cell lung cancer -Patient follows Dr. Earlie Server -Status post 4 cycles carboplatin and etoposide -Currently under observation -Most recent CT reportedly did not show any recurrence  Essential hypertension -Continued on metoprolol tartrate, amlodipine and clonidine -BP presently well controlled  COPD/pulmonary fibrosis -Stable on room air currently -Continue Advair home dose as tolerated  Hypothyroidism -Continue with Synthroid as tolerated  Hyperlipidemia -Continue Crestor as pt tolerates  Restless leg syndrome -Continue pramipexole as pt tolerates  GERD -Continue PPI  Discharge Instructions   Allergies as of 05/04/2020      Reactions   Advair Hfa [fluticasone-salmeterol] Other (See Comments)   Developed thrush, although the mouth WAS being rinsed as directed   Penicillins Rash   Has patient had a PCN reaction causing immediate rash, facial/tongue/throat swelling, SOB or lightheadedness with hypotension: No Has patient had a PCN reaction causing severe rash involving mucus membranes or skin necrosis: No Has patient had a PCN reaction that required hospitalization: No Has  patient had a PCN reaction occurring within the last 10 years:  No If all of the above answers are "NO", then may proceed with Cephalosporin use.      Medication List    TAKE these medications   acetaminophen 325 MG tablet Commonly known as: TYLENOL Take 2 tablets (650 mg total) by mouth every 6 (six) hours as needed for mild pain (or Fever >/= 101).   albuterol 108 (90 Base) MCG/ACT inhaler Commonly known as: VENTOLIN HFA Inhale 2 puffs into the lungs every 6 (six) hours as needed for wheezing or shortness of breath.   allopurinol 100 MG tablet Commonly known as: ZYLOPRIM Take 1 tablet (100 mg total) by mouth daily. What changed: when to take this   amLODipine 10 MG tablet Commonly known as: NORVASC Take 10 mg by mouth at bedtime.   apixaban 2.5 MG Tabs tablet Commonly known as: ELIQUIS Take 1 tablet (2.5 mg total) by mouth 2 (two) times daily.   aspirin EC 81 MG tablet Take 81 mg by mouth at bedtime.   cloNIDine 0.1 MG tablet Commonly known as: CATAPRES Take 0.1 mg by mouth in the morning and at bedtime.   Darbepoetin Alfa 100 MCG/0.5ML Sosy injection Commonly known as: ARANESP Inject 0.5 mLs (100 mcg total) into the vein every Thursday with hemodialysis.   Dialyvite 800-Zinc 15 0.8 MG Tabs Take 1 tablet by mouth daily.   HYDROcodone-acetaminophen 5-325 MG tablet Commonly known as: NORCO/VICODIN Take 1-2 tablets by mouth every 8 (eight) hours as needed for moderate pain or severe pain.   iron sucrose in sodium chloride 0.9 % 100 mL 50 mg.   levothyroxine 175 MCG tablet Commonly known as: SYNTHROID Take 175 mcg by mouth daily before breakfast.   lidocaine-prilocaine cream Commonly known as: EMLA Apply 1 application topically Every Tuesday,Thursday,and Saturday with dialysis.   metoprolol tartrate 50 MG tablet Commonly known as: LOPRESSOR Take 25 mg by mouth 2 (two) times daily.   MIRCERA IJ Inject 30 mg into the vein every 28 (twenty-eight) days.    omeprazole 40 MG capsule Commonly known as: PRILOSEC Take 40 mg by mouth in the morning.   pramipexole 0.25 MG tablet Commonly known as: MIRAPEX Take 0.25 mg by mouth at bedtime.   rosuvastatin 10 MG tablet Commonly known as: CRESTOR Take 10 mg by mouth at bedtime.            Durable Medical Equipment  (From admission, onward)         Start     Ordered   05/03/20 1624  For home use only DME 3 n 1  Once        05/03/20 1624   05/03/20 1624  For home use only DME Walker rolling  Once       Question Answer Comment  Walker: With 5 Inch Wheels   Patient needs a walker to treat with the following condition History of right hip hemiarthroplasty      05/03/20 1624   05/02/20 1721  For home use only DME Walker rolling  Once       Question Answer Comment  Walker: With [place] Wheels   Patient needs a walker to treat with the following condition Hip fracture ([place])      05/02/20 1720   05/02/20 1720  For home use only DME 3 n 1  Once        05/02/20 1720          Follow-up Information    [facility] Follow up.   Specialty: Home Health  Services Why: Alvis Lemmings will be providing you with physical therapy and occupational therapy at your home.  You should receive a call within 24-48 hours of your discharge from the hospital. Contact information: [address] [place] [address] [phone]        [facility] Follow up.   Why: [employer] will be providing you with a rolling walker and 3:1 prior to being discharged home. Contact information: [address] [place] [address] [phone]        Altamese Hatfield, MD. Schedule an appointment as soon as possible for a visit in 10 day(s).   Specialty: Orthopedic Surgery Contact information: [place] [address] [phone]        [name], MD. Schedule an appointment as soon as possible for a visit in 3 week(s).   Specialty: Internal  Medicine Contact information: [address] [place] [address] [phone]              Allergies  Allergen Reactions  . Advair Hfa [Fluticasone-Salmeterol] Other (See Comments)    Developed thrush, although the mouth WAS being rinsed as directed  . Penicillins Rash    Has patient had a PCN reaction causing immediate rash, facial/tongue/throat swelling, SOB or lightheadedness with hypotension: No Has patient had a PCN reaction causing severe rash involving mucus membranes or skin necrosis: No Has patient had a PCN reaction that required hospitalization: No Has patient had a PCN reaction occurring within the last 10 years: No If all of the above answers are "NO", then may proceed with Cephalosporin use.     Consultations:  Orthopedic Surgery  Nephrology  Procedures/Studies: DG Chest 1 View  Result Date: 04/29/2020 CLINICAL DATA:  Fall with right hip pain EXAM: CHEST  1 VIEW COMPARISON:  04/06/2020 FINDINGS: Mild cardiomegaly. Widened upper mediastinum with known thoracic aortic aneurysm. Infiltrate around the left hilum with volume loss correlating with history of lung cancer and radiotherapy. Bilateral interstitial prominence that is mildly asymmetric to the right. No visible effusion or pneumothorax. IMPRESSION: 1. Interstitial prominence, possible mild edema. 2. Post treatment left hilum. 3. Known thoracic aortic aneurysm. Electronically Signed   By: Monte Fantasia M.D.   On: 04/29/2020 07:31   DG Chest 2 View  Result Date: 04/05/2020 CLINICAL DATA:  Shortness of breath EXAM: CHEST - 2 VIEW COMPARISON:  Chest CT January 16, 2020; chest radiograph May 22, 2019 FINDINGS: There is fibrosis throughout the lungs bilaterally with patchy airspace opacity in the left upper lobe an each lung base. Apparent radiation therapy change left perihilar region. Heart is upper normal in size with pulmonary vascularity normal. No adenopathy. There is aortic atherosclerosis.  Aneurysmal dilatation in the aortic arch region is better seen on recent CT. No adenopathy. No bone lesions. IMPRESSION: Underlying fibrosis. Radiation therapy fibrosis noted in the left perihilar region. Patchy airspace opacity in the right base laterally, concerning for focal pneumonia. Airspace opacity in the left upper lobe and left base are similar to March 2021 study and may represent combination of scarring and residual infiltrate. Concern for a degree of multifocal pneumonia with superimposed scarring and fibrosis. Stable cardiac silhouette. No adenopathy appreciable. Aortic Atherosclerosis (ICD10-I70.0). Electronically Signed   By: Lowella Grip III M.D.   On: 04/05/2020 13:06   CT Angio Chest PE W and/or Wo Contrast  Result Date: 04/05/2020 CLINICAL DATA:  Intermittent shortness of breath for 1 month, worsening over last several days, orthopnea, hypoxia, history of  non-small cell lung cancer status post chemotherapy and radiation therapy EXAM: CT ANGIOGRAPHY CHEST WITH CONTRAST TECHNIQUE: Multidetector CT imaging of the chest was performed using the standard protocol during bolus administration of intravenous contrast. Multiplanar CT image reconstructions and MIPs were obtained to evaluate the vascular anatomy. CONTRAST:  110m OMNIPAQUE IOHEXOL 350 MG/ML SOLN COMPARISON:  01/16/2020, 12/05/2019 FINDINGS: Cardiovascular: This is a technically adequate evaluation of the pulmonary vasculature. No filling defects or pulmonary emboli. The heart is enlarged, with prominent left ventricular dilatation. No pericardial effusion. Focal aneurysmal dilatation of the proximal aortic arch again noted, measuring up to 4.6 cm in greatest diameter, stable. No evidence of dissection. There is diffuse atherosclerosis of the aorta and coronary vessels. Mediastinum/Nodes: No enlarged mediastinal, hilar, or axillary lymph nodes. Thyroid gland, trachea, and esophagus demonstrate no significant findings. Lungs/Pleura:  Upper lobe predominant scarring fibrosis is noted. Likely post radiation change within the left upper lobe, stable. There is superimposed interlobular septal thickening and bilateral airspace disease, along with bilateral pleural effusions. Findings are consistent with congestive heart failure. No pneumothorax. There are areas of compressive atelectasis within the bilateral lower lobes. The subcentimeter pulmonary nodule seen previously are not well visualized on this exam due to underlying consolidation. Upper Abdomen: No acute abnormality. Musculoskeletal: No acute displaced fractures. Reconstructed images demonstrate no additional findings. Review of the MIP images confirms the above findings. IMPRESSION: 1. No evidence of pulmonary embolus. 2. Findings consistent with congestive heart failure superimposed upon background scarring and fibrosis. 3. Stable 4.6 cm aneurysmal dilatation of the proximal aortic arch. No evidence of dissection. 4. Post therapeutic changes left upper lobe likely related to prior radiation therapy for lung cancer. Stable appearance since prior studies. 5. Aortic Atherosclerosis (ICD10-I70.0) and Emphysema (ICD10-J43.9). Electronically Signed   By: MRanda NgoM.D.   On: 04/05/2020 17:09   IR Fluoro Guide CV Line Right  Result Date: 05/03/2020 INDICATION: End-stage renal disease EXAM: IMAGE GUIDED TEMPORARY HEMODIALYSIS CATHETER MEDICATIONS: None ANESTHESIA/SEDATION: None FLUOROSCOPY TIME:  Fluoroscopy Time: 0 minutes 12 seconds COMPLICATIONS: None PROCEDURE: Informed written consent was obtained from the patient's family after a discussion of the risks, benefits, and alternatives to treatment. Questions regarding the procedure were encouraged and answered. The right neck was prepped with chlorhexidine in a sterile fashion, and a sterile drape was applied covering the operative field. Maximum barrier sterile technique with sterile gowns and gloves were used for the procedure. A  timeout was performed prior to the initiation of the procedure. A micropuncture kit was utilized to access the right internal jugular vein under direct, real-time ultrasound guidance after the overlying soft tissues were anesthetized with 1% lidocaine with epinephrine. Ultrasound image documentation was performed. The microwire was kinked to measure appropriate catheter length. A stiff glidewire was advanced to the level of the IVC. A 16 cm hemodialysis catheter was then placed over the wire. Final catheter positioning was confirmed and documented with a spot radiographic image. The catheter aspirates and flushes normally. The catheter was flushed with appropriate volume heparin dwells. Dressings were applied. The patient tolerated the procedure well without immediate post procedural complication. IMPRESSION: Status post image guided temporary hemodialysis catheter placement. Signed, JDulcy Fanny WDellia Nims RPVI [facility] Electronically Signed   By: JCorrie MckusickD.O.   On: 05/01/2020 13:56   IR [place] Vasc Access Left  Result Date: 05/03/2020 CLINICAL DATA:  Thrombosed left upper arm brachial artery to brachial vein dialysis graft. EXAM: 1. ULTRASOUND GUIDANCE FOR  VASCULAR ACCESS OF DIALYSIS FISTULA. 2. DIALYSIS FISTULA DECLOT PROCEDURE WITH TWO SEPARATE GRAFT ACCESS SITES. 3. VENOUS ANGIOPLASTY OF DIALYSIS GRAFT AND VENOUS ANASTOMOSIS ANESTHESIA/SEDATION: 1.5 mg IV Versed; 50 mcg IV Fentanyl. Total Moderate Sedation Time 68 minutes. The patient's level of consciousness and physiologic status were continuously monitored during the procedure by Radiology nursing. CONTRAST:  30m OMNIPAQUE IOHEXOL 300 MG/ML  SOLN MEDICATIONS: 3000 U IV heparin FLUOROSCOPY TIME:  3 minutes and 42 seconds.  15.0 mGy. PROCEDURE: The procedure, risks, benefits, and alternatives were explained to the patient. Questions regarding the procedure were encouraged and answered. The  patient understands and consents to the procedure. A time-out was performed prior to initiating the procedure. The left upper arm dialysis graft was prepped with chlorhexidine in a sterile fashion, and a sterile drape was applied covering the operative field. A sterile gown and sterile gloves were used for the procedure. Local anesthesia was provided with 1% Lidocaine. Preliminary ultrasound was performed of the dialysis graft. Both antegrade and retrograde graft access was performed with micropuncture sets under direct ultrasound guidance. Ultrasound image documentation was performed. 6-French antegrade and retrograde sheaths were placed. A diagnostic catheter was advanced and contrast injection performed at the level of patent venous outflow. Outflow venography was also performed via the catheter. Balloon angioplasty was performed at the level of the venous anastomosis and dialysis graft with a 7 mm x 4 cm Conquest balloon. Mechanical thrombectomy was performed within the dialysis graft with the Angiojet device. Thrombectomy across the arterial anastomosis was then performed with a 4-French Fogarty balloon catheter. Several passes were made with the Fogarty catheter. Suction thrombectomy was then performed through the antegrade sheath. Graft patency was reassessed with angiography. Additional 7 mm balloon angioplasty was performed across the venous anastomosis and within a specific segment of the dialysis graft. Upon completion of the procedure, both sheaths were removed and hemostasis obtained with manual compression. COMPLICATIONS: None FINDINGS: Ultrasound confirms thrombosis of the graft. After mechanical thrombectomy and thrombectomy across the arterial anastomosis, flow within the dialysis graft was re-established. There was a persistent area of irregularity within the graft lumen roughly at the juncture of the upper and middle thirds of the graft along the superior most puncture sites. Despite repeated  dilatation with a 7 mm balloon, focal segmental irregularity and luminal narrowing of this segment of the graft was present over a length of approximately 1-1.5 cm. By ultrasound, focal graft irregularity is also seen in this area with an appearance suggestive of graft damage/infolding. Stenosis at the venous anastomosis of the dialysis graft was also present which initially appeared to be greater than 70% in caliber. This responded well to balloon angioplasty with final imaging demonstrating no significant residual stenosis at the venous anastomosis. An appropriate short covered stent that can eventually be punctured with a needle was not available to repair the focal area of graft irregularity/infolding. Only longer covered stents were available which would have covered to much of the usable graft length currently. IMPRESSION: Successful declot procedure to reestablish flow in an occluded left upper arm dialysis graft. There were 2 abnormalities uncovered related to the graft that likely contributed to thrombosis. A greater than 70% stenosis at the venous anastomosis showed significant improvement after 7 mm balloon angioplasty with no residual stenosis remaining. There was an area of resistant irregularity and narrowing within the upper graft lumen over a short segment as described above. This has the appearance of focal graft damage/infolding likely related to prior puncture trauma. An  appropriate short covered stent was not available to repair the focal area of graft irregularity. ACCESS: The graft remains amenable to percutaneous intervention. Given appearance of the focal area of resistant graft irregularity and narrowing, consider vascular surgical consultation for graft repair/revision should there be future difficulty with graft flow or recurrent thrombosis. Electronically Signed   By: Aletta Edouard M.D.   On: 05/03/2020 17:09   IR US Guide Vasc Access Right  Result Date: 05/01/2020 INDICATION:  End-stage renal disease EXAM: IMAGE GUIDED TEMPORARY HEMODIALYSIS CATHETER MEDICATIONS: None ANESTHESIA/SEDATION: None FLUOROSCOPY TIME:  Fluoroscopy Time: 0 minutes 12 seconds COMPLICATIONS: None PROCEDURE: Informed written consent was obtained from the patient's family after a discussion of the risks, benefits, and alternatives to treatment. Questions regarding the procedure were encouraged and answered. The right neck was prepped with chlorhexidine in a sterile fashion, and a sterile drape was applied covering the operative field. Maximum barrier sterile technique with sterile gowns and gloves were used for the procedure. A timeout was performed prior to the initiation of the procedure. A micropuncture kit was utilized to access the right internal jugular vein under direct, real-time ultrasound guidance after the overlying soft tissues were anesthetized with 1% lidocaine with epinephrine. Ultrasound image documentation was performed. The microwire was kinked to measure appropriate catheter length. A stiff glidewire was advanced to the level of the IVC. A 16 cm hemodialysis catheter was then placed over the wire. Final catheter positioning was confirmed and documented with a spot radiographic image. The catheter aspirates and flushes normally. The catheter was flushed with appropriate volume heparin dwells. Dressings were applied. The patient tolerated the procedure well without immediate post procedural complication. IMPRESSION: Status post image guided temporary hemodialysis catheter placement. Signed, Dulcy Fanny. Dellia Nims, RPVI [facility] Radiology Electronically Signed   By: Corrie Mckusick D.O.   On: 05/01/2020 13:56   Portable chest 1 View  Result Date: 04/06/2020 CLINICAL DATA:  Follow-up pulmonary infiltrate EXAM: PORTABLE CHEST 1 VIEW COMPARISON:  Radiograph 04/05/2020, CT 04/05/2020 FINDINGS: There are some increasing coalescent opacities predominantly  through the right mid to lower lung with enlarging bilateral pleural effusions from the comparison radiograph in keeping with the large effusion seen on CT images. There is a background of chronic reticular scarring and severe emphysematous changes better assessed on cross-sectional imaging. Focal aneurysmal dilatation of the thoracic aortic arch is again noted. Remaining cardiomediastinal contours are similar to prior with cardiomegaly. No pneumothorax. Telemetry leads overlie the chest. IMPRESSION: 1. Increasing coalescent and interstitial opacities predominantly through the right mid to lower lung likely reflecting worsening pulmonary edema with enlarging bilateral pleural effusions and passive atelectasis when compared to prior radiograph. 2. Focal aneurysmal dilatation of the thoracic aortic arch, similar to comparison. 3. Background of chronic architectural distortion, fibrosis and severe emphysema Electronically Signed   By: Lovena Le M.D.   On: 04/06/2020 05:29   IR THROMBECTOMY AV FISTULA W/THROMBOLYSIS/PTA INC/SHUNT/IMG LEFT  Result Date: 05/03/2020 CLINICAL DATA:  Thrombosed left upper arm brachial artery to brachial vein dialysis graft. EXAM: 1. ULTRASOUND GUIDANCE FOR VASCULAR ACCESS OF DIALYSIS FISTULA. 2. DIALYSIS FISTULA DECLOT PROCEDURE WITH TWO SEPARATE GRAFT ACCESS SITES. 3. VENOUS ANGIOPLASTY OF DIALYSIS GRAFT AND VENOUS ANASTOMOSIS ANESTHESIA/SEDATION: 1.5 mg IV Versed; 50 mcg IV Fentanyl. Total Moderate Sedation Time 68 minutes. The patient's level of consciousness and physiologic status were continuously monitored during the procedure by Radiology nursing. CONTRAST:  97m OMNIPAQUE IOHEXOL 300 MG/ML  SOLN MEDICATIONS: 3000 U IV heparin  FLUOROSCOPY TIME:  3 minutes and 42 seconds.  15.0 mGy. PROCEDURE: The procedure, risks, benefits, and alternatives were explained to the patient. Questions regarding the procedure were encouraged and answered. The patient understands and consents to the  procedure. A time-out was performed prior to initiating the procedure. The left upper arm dialysis graft was prepped with chlorhexidine in a sterile fashion, and a sterile drape was applied covering the operative field. A sterile gown and sterile gloves were used for the procedure. Local anesthesia was provided with 1% Lidocaine. Preliminary ultrasound was performed of the dialysis graft. Both antegrade and retrograde graft access was performed with micropuncture sets under direct ultrasound guidance. Ultrasound image documentation was performed. 6-French antegrade and retrograde sheaths were placed. A diagnostic catheter was advanced and contrast injection performed at the level of patent venous outflow. Outflow venography was also performed via the catheter. Balloon angioplasty was performed at the level of the venous anastomosis and dialysis graft with a 7 mm x 4 cm Conquest balloon. Mechanical thrombectomy was performed within the dialysis graft with the Angiojet device. Thrombectomy across the arterial anastomosis was then performed with a 4-French Fogarty balloon catheter. Several passes were made with the Fogarty catheter. Suction thrombectomy was then performed through the antegrade sheath. Graft patency was reassessed with angiography. Additional 7 mm balloon angioplasty was performed across the venous anastomosis and within a specific segment of the dialysis graft. Upon completion of the procedure, both sheaths were removed and hemostasis obtained with manual compression. COMPLICATIONS: None FINDINGS: Ultrasound confirms thrombosis of the graft. After mechanical thrombectomy and thrombectomy across the arterial anastomosis, flow within the dialysis graft was re-established. There was a persistent area of irregularity within the graft lumen roughly at the juncture of the upper and middle thirds of the graft along the superior most puncture sites. Despite repeated dilatation with a 7 mm balloon, focal  segmental irregularity and luminal narrowing of this segment of the graft was present over a length of approximately 1-1.5 cm. By ultrasound, focal graft irregularity is also seen in this area with an appearance suggestive of graft damage/infolding. Stenosis at the venous anastomosis of the dialysis graft was also present which initially appeared to be greater than 70% in caliber. This responded well to balloon angioplasty with final imaging demonstrating no significant residual stenosis at the venous anastomosis. An appropriate short covered stent that can eventually be punctured with a needle was not available to repair the focal area of graft irregularity/infolding. Only longer covered stents were available which would have covered to much of the usable graft length currently. IMPRESSION: Successful declot procedure to reestablish flow in an occluded left upper arm dialysis graft. There were 2 abnormalities uncovered related to the graft that likely contributed to thrombosis. A greater than 70% stenosis at the venous anastomosis showed significant improvement after 7 mm balloon angioplasty with no residual stenosis remaining. There was an area of resistant irregularity and narrowing within the upper graft lumen over a short segment as described above. This has the appearance of focal graft damage/infolding likely related to prior puncture trauma. An appropriate short covered stent was not available to repair the focal area of graft irregularity. ACCESS: The graft remains amenable to percutaneous intervention. Given appearance of the focal area of resistant graft irregularity and narrowing, consider vascular surgical consultation for graft repair/revision should there be future difficulty with graft flow or recurrent thrombosis. Electronically Signed   By: Aletta Edouard M.D.   On: 05/03/2020 17:09   DG Hip  Port Unilat With Pelvis 1V Right  Result Date: 04/30/2020 CLINICAL DATA:  Right femoral neck fracture.  Status post hemiarthroplasty. EXAM: DG HIP (WITH OR WITHOUT PELVIS) 1V PORT RIGHT COMPARISON:  Radiographs dated 04/29/2020 FINDINGS: The proximal femoral prosthesis appears in excellent position. No acute bone abnormality. IMPRESSION: Satisfactory appearance of the right hip after insertion of proximal femoral prosthesis. Electronically Signed   By: Lorriane Shire M.D.   On: 04/30/2020 15:48   DG Hip Unilat With Pelvis 2-3 Views Right  Result Date: 04/29/2020 CLINICAL DATA:  Fall. EXAM: DG HIP (WITH OR WITHOUT PELVIS) 2-3V RIGHT COMPARISON:  03/09/2016 FINDINGS: Right femoral neck fracture without measurable displacement. Located hip. No significant acetabular changes. Osteopenia. IMPRESSION: Acute right femoral neck fracture. Electronically Signed   By: Monte Fantasia M.D.   On: 04/29/2020 07:32    Subjective: Eager to go home  Discharge Exam: Vitals:   05/04/20 1152 05/04/20 1345  BP: 126/65 120/65  Pulse: 77 82  Resp: 16 17  Temp:  98.5 F (36.9 C)  SpO2: 97% 97%   Vitals:   05/04/20 1100 05/04/20 1130 05/04/20 1152 05/04/20 1345  BP: (!) 103/49 (!) 100/45 126/65 120/65  Pulse: 71 69 77 82  Resp: '16 16 16 17  ' Temp:    98.5 F (36.9 C)  TempSrc:    Oral  SpO2:   97% 97%  Weight:   69.5 kg   Height:        General: Pt is alert, awake, not in acute distress Cardiovascular: RRR, S1/S2 +, no rubs, no gallops Respiratory: CTA bilaterally, no wheezing, no rhonchi Abdominal: Soft, NT, ND, bowel sounds + Extremities: no edema, no cyanosis   The results of significant diagnostics from this hospitalization (including imaging, microbiology, ancillary and laboratory) are listed below for reference.     Microbiology: Recent Results (from the past 240 hour(s))  SARS Coronavirus 2 by RT PCR (hospital order, performed in [facility] hospital lab) Nasopharyngeal Nasopharyngeal Swab     Status: None   Collection Time: 04/29/20  8:35 AM   Specimen: Nasopharyngeal Swab  Result Value  Ref Range Status   SARS Coronavirus 2 NEGATIVE NEGATIVE Final    Comment: (NOTE) SARS-CoV-2 target nucleic acids are NOT DETECTED.  The SARS-CoV-2 RNA is generally detectable in upper and lower respiratory specimens during the acute phase of infection. The lowest concentration of SARS-CoV-2 viral copies this assay can detect is 250 copies / mL. A negative result does not preclude SARS-CoV-2 infection and should not be used as the sole basis for treatment or other patient management decisions.  A negative result may occur with improper specimen collection / handling, submission of specimen other than nasopharyngeal swab, presence of viral mutation(s) within the areas targeted by this assay, and inadequate number of viral copies (<250 copies / mL). A negative result must be combined with clinical observations, patient history, and epidemiological information.  Fact Sheet for Patients:   StrictlyIdeas.no  Fact Sheet for Healthcare Providers: BankingDealers.co.za  This test is not yet approved or  cleared by the [place] FDA and has been authorized for detection and/or diagnosis of SARS-CoV-2 by FDA under an Emergency Use Authorization (EUA).  This EUA will remain in effect (meaning this test can be used) for the duration of the COVID-19 declaration under Section 564(b)(1) of the Act, 21 U.S.C. section 360bbb-3(b)(1), unless the authorization is terminated or revoked sooner.  Performed at [facility], [address], [place], [place] [address]   MRSA PCR  Screening     Status: None   Collection Time: 04/29/20  5:31 PM   Specimen: Nasal Mucosa; Nasopharyngeal  Result Value Ref Range Status   MRSA by PCR NEGATIVE NEGATIVE Final    Comment:        The GeneXpert MRSA Assay (FDA approved for NASAL specimens only), is one component of a comprehensive MRSA colonization surveillance program. It is not intended to diagnose  MRSA infection nor to guide or monitor treatment for MRSA infections. Performed at [facility], [place] [address], [place], [place] [address]      Labs: BNP (last 3 results) No results for input(s): BNP in the last 8760 hours. Basic Metabolic Panel: Recent Labs  Lab 04/30/20 0515 05/01/20 0248 05/02/20 0920 05/03/20 0432 05/04/20 0730  NA 134* 133* 136 136 134*  K 4.6 5.8* 4.3 4.4 4.6  CL 94* 96* 99 98 97*  CO2 '30 24 25 25 22  ' GLUCOSE 75 133* 145* 101* 182*  BUN 17 35* 25* 39* 60*  CREATININE 5.93* 8.02* 5.94* 7.73* 10.46*  CALCIUM 8.8* 8.4* 8.6* 8.2* 8.4*  PHOS 4.1  --   --  3.5 4.4   Liver Function Tests: Recent Labs  Lab 04/30/20 0515 05/03/20 0432 05/04/20 0730  ALBUMIN 3.1* 2.4* 2.4*   No results for input(s): LIPASE, AMYLASE in the last 168 hours. No results for input(s): AMMONIA in the last 168 hours. CBC: Recent Labs  Lab 04/29/20 0712 04/29/20 0712 04/30/20 0515 05/01/20 0248 05/02/20 0920 05/03/20 0432 05/04/20 0730  WBC 7.9   < > 5.8 6.4 8.0 7.0 6.5  NEUTROABS 5.7  --   --   --   --   --   --   HGB 10.3*   < > 10.8* 9.9* 9.6* 8.5* 8.2*  HCT 31.0*   < > 31.8* 29.3* 29.0* 25.9* 24.6*  MCV 100.0   < > 99.1 99.0 100.3* 100.8* 99.2  PLT 183   < > 168 155 185 164 194   < > = values in this interval not displayed.   Cardiac Enzymes: No results for input(s): CKTOTAL, CKMB, CKMBINDEX, TROPONINI in the last 168 hours. BNP: Invalid input(s): POCBNP CBG: No results for input(s): GLUCAP in the last 168 hours. D-Dimer No results for input(s): DDIMER in the last 72 hours. Hgb A1c No results for input(s): HGBA1C in the last 72 hours. Lipid Profile No results for input(s): CHOL, HDL, LDLCALC, TRIG, CHOLHDL, LDLDIRECT in the last 72 hours. Thyroid function studies No results for input(s): TSH, T4TOTAL, T3FREE, THYROIDAB in the last 72 hours.  Invalid input(s): FREET3 Anemia work up No results for input(s): VITAMINB12, FOLATE, FERRITIN, TIBC,  IRON, RETICCTPCT in the last 72 hours. Urinalysis    Component Value Date/Time   COLORURINE YELLOW 05/22/2019 1919   APPEARANCEUR CLEAR 05/22/2019 1919   LABSPEC 1.022 05/22/2019 1919   PHURINE 8.0 05/22/2019 1919   GLUCOSEU 150 (A) 05/22/2019 1919   HGBUR MODERATE (A) 05/22/2019 [place] NEGATIVE 05/22/2019 [place] NEGATIVE 05/22/2019 1919   PROTEINUR >=300 (A) 05/22/2019 1919   NITRITE NEGATIVE 05/22/2019 1919   LEUKOCYTESUR NEGATIVE 05/22/2019 1919   Sepsis Labs Invalid input(s): PROCALCITONIN,  WBC,  LACTICIDVEN Microbiology Recent Results (from the past 240 hour(s))  SARS Coronavirus 2 by RT PCR (hospital order, performed in [place] hospital lab) Nasopharyngeal Nasopharyngeal Swab     Status: None   Collection Time: 04/29/20  8:35 AM   Specimen: Nasopharyngeal Swab  Result  Value Ref Range Status   SARS Coronavirus 2 NEGATIVE NEGATIVE Final    Comment: (NOTE) SARS-CoV-2 target nucleic acids are NOT DETECTED.  The SARS-CoV-2 RNA is generally detectable in upper and lower respiratory specimens during the acute phase of infection. The lowest concentration of SARS-CoV-2 viral copies this assay can detect is 250 copies / mL. A negative result does not preclude SARS-CoV-2 infection and should not be used as the sole basis for treatment or other patient management decisions.  A negative result may occur with improper specimen collection / handling, submission of specimen other than nasopharyngeal swab, presence of viral mutation(s) within the areas targeted by this assay, and inadequate number of viral copies (<250 copies / mL). A negative result must be combined with clinical observations, patient history, and epidemiological information.  Fact Sheet for Patients:   StrictlyIdeas.no  Fact Sheet for Healthcare Providers: BankingDealers.co.za  This test is not yet approved or  cleared by the [place]  FDA and has been authorized for detection and/or diagnosis of SARS-CoV-2 by FDA under an Emergency Use Authorization (EUA).  This EUA will remain in effect (meaning this test can be used) for the duration of the COVID-19 declaration under Section 564(b)(1) of the Act, 21 U.S.C. section 360bbb-3(b)(1), unless the authorization is terminated or revoked sooner.  Performed at [facility], [address], [place], [place] [address]   MRSA PCR Screening     Status: None   Collection Time: 04/29/20  5:31 PM   Specimen: Nasal Mucosa; Nasopharyngeal  Result Value Ref Range Status   MRSA by PCR NEGATIVE NEGATIVE Final    Comment:        The GeneXpert MRSA Assay (FDA approved for NASAL specimens only), is one component of a comprehensive MRSA colonization surveillance program. It is not intended to diagnose MRSA infection nor to guide or monitor treatment for MRSA infections. Performed at [facility], [place] [address] [place], [place] [address]    Time spent: 25mn  SIGNED:   SMarylu Lund MD  [facility] 05/04/2020, 6:27 PM  If 7PM-7AM, please contact night-coverage

## 2020-05-05 NOTE — Telephone Encounter (Signed)
I notified Aldona Bar of Dr Worthy Flank reply. She voiced understanding.

## 2020-05-06 DIAGNOSIS — I2510 Atherosclerotic heart disease of native coronary artery without angina pectoris: Secondary | ICD-10-CM | POA: Diagnosis not present

## 2020-05-06 DIAGNOSIS — S72001A Fracture of unspecified part of neck of right femur, initial encounter for closed fracture: Secondary | ICD-10-CM | POA: Diagnosis not present

## 2020-05-06 DIAGNOSIS — D631 Anemia in chronic kidney disease: Secondary | ICD-10-CM | POA: Diagnosis not present

## 2020-05-06 DIAGNOSIS — N186 End stage renal disease: Secondary | ICD-10-CM | POA: Diagnosis not present

## 2020-05-06 DIAGNOSIS — I119 Hypertensive heart disease without heart failure: Secondary | ICD-10-CM | POA: Diagnosis not present

## 2020-05-06 DIAGNOSIS — N2581 Secondary hyperparathyroidism of renal origin: Secondary | ICD-10-CM | POA: Diagnosis not present

## 2020-05-06 DIAGNOSIS — Z992 Dependence on renal dialysis: Secondary | ICD-10-CM | POA: Diagnosis not present

## 2020-05-06 DIAGNOSIS — D689 Coagulation defect, unspecified: Secondary | ICD-10-CM | POA: Diagnosis not present

## 2020-05-06 DIAGNOSIS — D509 Iron deficiency anemia, unspecified: Secondary | ICD-10-CM | POA: Diagnosis not present

## 2020-05-07 DIAGNOSIS — C3490 Malignant neoplasm of unspecified part of unspecified bronchus or lung: Secondary | ICD-10-CM | POA: Diagnosis not present

## 2020-05-07 DIAGNOSIS — J449 Chronic obstructive pulmonary disease, unspecified: Secondary | ICD-10-CM | POA: Diagnosis not present

## 2020-05-07 DIAGNOSIS — I2510 Atherosclerotic heart disease of native coronary artery without angina pectoris: Secondary | ICD-10-CM | POA: Diagnosis not present

## 2020-05-07 DIAGNOSIS — I252 Old myocardial infarction: Secondary | ICD-10-CM | POA: Diagnosis not present

## 2020-05-07 DIAGNOSIS — S72001G Fracture of unspecified part of neck of right femur, subsequent encounter for closed fracture with delayed healing: Secondary | ICD-10-CM | POA: Diagnosis not present

## 2020-05-07 DIAGNOSIS — I509 Heart failure, unspecified: Secondary | ICD-10-CM | POA: Diagnosis not present

## 2020-05-07 DIAGNOSIS — I132 Hypertensive heart and chronic kidney disease with heart failure and with stage 5 chronic kidney disease, or end stage renal disease: Secondary | ICD-10-CM | POA: Diagnosis not present

## 2020-05-07 DIAGNOSIS — I5042 Chronic combined systolic (congestive) and diastolic (congestive) heart failure: Secondary | ICD-10-CM | POA: Diagnosis not present

## 2020-05-08 DIAGNOSIS — N2581 Secondary hyperparathyroidism of renal origin: Secondary | ICD-10-CM | POA: Diagnosis not present

## 2020-05-08 DIAGNOSIS — D631 Anemia in chronic kidney disease: Secondary | ICD-10-CM | POA: Diagnosis not present

## 2020-05-08 DIAGNOSIS — D509 Iron deficiency anemia, unspecified: Secondary | ICD-10-CM | POA: Diagnosis not present

## 2020-05-08 DIAGNOSIS — D689 Coagulation defect, unspecified: Secondary | ICD-10-CM | POA: Diagnosis not present

## 2020-05-08 DIAGNOSIS — N186 End stage renal disease: Secondary | ICD-10-CM | POA: Diagnosis not present

## 2020-05-08 DIAGNOSIS — Z992 Dependence on renal dialysis: Secondary | ICD-10-CM | POA: Diagnosis not present

## 2020-05-10 DIAGNOSIS — I509 Heart failure, unspecified: Secondary | ICD-10-CM | POA: Diagnosis not present

## 2020-05-10 DIAGNOSIS — I2510 Atherosclerotic heart disease of native coronary artery without angina pectoris: Secondary | ICD-10-CM | POA: Diagnosis not present

## 2020-05-10 DIAGNOSIS — I132 Hypertensive heart and chronic kidney disease with heart failure and with stage 5 chronic kidney disease, or end stage renal disease: Secondary | ICD-10-CM | POA: Diagnosis not present

## 2020-05-10 DIAGNOSIS — S72001G Fracture of unspecified part of neck of right femur, subsequent encounter for closed fracture with delayed healing: Secondary | ICD-10-CM | POA: Diagnosis not present

## 2020-05-10 DIAGNOSIS — I252 Old myocardial infarction: Secondary | ICD-10-CM | POA: Diagnosis not present

## 2020-05-11 ENCOUNTER — Ambulatory Visit: Payer: Medicare Other | Admitting: Gastroenterology

## 2020-05-11 DIAGNOSIS — D631 Anemia in chronic kidney disease: Secondary | ICD-10-CM | POA: Diagnosis not present

## 2020-05-11 DIAGNOSIS — N2581 Secondary hyperparathyroidism of renal origin: Secondary | ICD-10-CM | POA: Diagnosis not present

## 2020-05-11 DIAGNOSIS — D689 Coagulation defect, unspecified: Secondary | ICD-10-CM | POA: Diagnosis not present

## 2020-05-11 DIAGNOSIS — D509 Iron deficiency anemia, unspecified: Secondary | ICD-10-CM | POA: Diagnosis not present

## 2020-05-11 DIAGNOSIS — Z992 Dependence on renal dialysis: Secondary | ICD-10-CM | POA: Diagnosis not present

## 2020-05-11 DIAGNOSIS — N186 End stage renal disease: Secondary | ICD-10-CM | POA: Diagnosis not present

## 2020-05-12 DIAGNOSIS — Z992 Dependence on renal dialysis: Secondary | ICD-10-CM | POA: Diagnosis not present

## 2020-05-12 DIAGNOSIS — N186 End stage renal disease: Secondary | ICD-10-CM | POA: Diagnosis not present

## 2020-05-12 DIAGNOSIS — I129 Hypertensive chronic kidney disease with stage 1 through stage 4 chronic kidney disease, or unspecified chronic kidney disease: Secondary | ICD-10-CM | POA: Diagnosis not present

## 2020-05-13 DIAGNOSIS — N2581 Secondary hyperparathyroidism of renal origin: Secondary | ICD-10-CM | POA: Diagnosis not present

## 2020-05-13 DIAGNOSIS — E039 Hypothyroidism, unspecified: Secondary | ICD-10-CM | POA: Diagnosis not present

## 2020-05-13 DIAGNOSIS — D689 Coagulation defect, unspecified: Secondary | ICD-10-CM | POA: Diagnosis not present

## 2020-05-13 DIAGNOSIS — Z992 Dependence on renal dialysis: Secondary | ICD-10-CM | POA: Diagnosis not present

## 2020-05-13 DIAGNOSIS — D509 Iron deficiency anemia, unspecified: Secondary | ICD-10-CM | POA: Diagnosis not present

## 2020-05-13 DIAGNOSIS — N186 End stage renal disease: Secondary | ICD-10-CM | POA: Diagnosis not present

## 2020-05-13 NOTE — H&P (Signed)
[facility] History & Physical  Rob Doctor, hospital MD  [facility] 04/05/2020  Chief Complaint: SOB HPI: The patient is a 74 y.o. year-old w/ hx of SCCa of lung (off chemo now, last chemoRx fall 2020), ESRD on HD x 1 yr [facility] TTS, HTN, HL, gout, COPD, pulm fibrosis, CAD sp stent comes to ED w/ SOB.  No fever, chills no prod cough, + dry cough, slight leg edema. May be losing wt, he is not sure.  SOB x 1-2 days, worse today. No CP.  Last HD Sat 2 days ago, next due tomorrow.  Asked to see for admit.    [name] and provided most of hx.  Pt failed HFNC so is now on bipap and wob is starting to settle down.      ROS  denies CP  no joint pain   no HA  no blurry vision  no rash  no diarrhea  no nausea/ vomiting     Past Medical History  Past Medical History:  Diagnosis Date  . Anemia   . Blood transfusion without reported diagnosis   . CAD (coronary artery disease)    STENT... MID CIRCUMFLEX...1997  . Chronic kidney disease    STAGE 3  . COPD (chronic obstructive pulmonary disease) ([place])   . Degenerative joint disease (DJD) of lumbar spine   . GERD (gastroesophageal reflux disease)   . Gout   . Hyperlipidemia   . Hypertension   . Hypothyroidism   . Incisional hernia    abdomen  . Leukocytosis    CHRONIC MILD  . Myocardial infarction ([place])    1997  . SCL CA dx'd 01/2019   Lung cancer   Past Surgical History  Past Surgical History:  Procedure Laterality Date  . ABDOMINAL AORTIC ANEURYSM REPAIR  2006  . AV FISTULA PLACEMENT Left 04/25/2019   Procedure: ARTERIOVENOUS (AV) FISTULA CREATION LEFT ARM;  Surgeon: Angelia Mould, MD;  Location: [facility];  Service: Vascular;  Laterality: Left;  . AV FISTULA PLACEMENT Left 05/19/2019   Procedure: CONVERSION OF LEFT ARM ARTERIOVENOUS FISTULA TO GRAFT;  Surgeon: Angelia Mould, MD;  Location: [facility];  Service: Vascular;  Laterality: Left;  . BIOPSY  09/30/2018   Procedure:  BIOPSY;  Surgeon: Jeremy Binder, MD;  [facility];  Service: Endoscopy;;  ascending colon  . COLONOSCOPY  2008  . COLONOSCOPY N/A 09/30/2018   Procedure: COLONOSCOPY;  Surgeon: [facility] Binder, MD;  [facility];  Service: Endoscopy;  Laterality: N/A;  9:00  . CORONARY ANGIOPLASTY WITH STENT [facility]  . HIP ARTHROPLASTY Right 04/30/2020   Procedure: ARTHROPLASTY  HIP (HEMIARTHROPLASTY);  Surgeon: Jeremy Chickasha, MD;  Location: [place];  Service: Orthopedics;  Laterality: Right;  . IR FLUORO GUIDE CV LINE RIGHT  04/22/2019  . IR FLUORO GUIDE CV LINE RIGHT  05/01/2020  . IR THROMBECTOMY AV FISTULA W/THROMBOLYSIS/PTA INC/SHUNT/IMG LEFT Left 05/03/2020  . IR US GUIDE VASC ACCESS LEFT  05/03/2020  . IR US GUIDE VASC ACCESS RIGHT  04/22/2019  . IR US GUIDE VASC ACCESS RIGHT  05/01/2020  . POLYPECTOMY  09/30/2018   Procedure: POLYPECTOMY;  Surgeon: [facility] Binder, MD;  [facility];  Service: Endoscopy;;  colon  . VIDEO BRONCHOSCOPY WITH ENDOBRONCHIAL NAVIGATION N/A 01/27/2019   Procedure: VIDEO BRONCHOSCOPY WITH ENDOBRONCHIAL NAVIGATION;  Surgeon: Jeremy Isaac, MD;  Location: [place];  Service: Thoracic;  Laterality:  N/A;  . VIDEO BRONCHOSCOPY WITH ENDOBRONCHIAL ULTRASOUND N/A 01/27/2019   Procedure: VIDEO BRONCHOSCOPY WITH ENDOBRONCHIAL ULTRASOUND;  Surgeon: [facility] Isaac, MD;  Location: [facility];  Service: Thoracic;  Laterality: N/A;   Family History  Family History  Problem Relation Age of Onset  . Stroke Brother   . Lung cancer Sister 49       lung cancer/former   Social History  reports that he quit smoking about 2 years ago. He has a 27.00 pack-year smoking history. He has never used smokeless tobacco. He reports current alcohol use. He reports that he does not use drugs. Allergies  Allergies  Allergen Reactions  . Advair Hfa [Fluticasone-Salmeterol] Other (See Comments)    Developed thrush, although the mouth WAS being rinsed as  directed  . Penicillins Rash    Has patient had a PCN reaction causing immediate rash, facial/tongue/throat swelling, SOB Johnson lightheadedness with hypotension: No Has patient had a PCN reaction causing severe rash involving mucus membranes Johnson skin necrosis: No Has patient had a PCN reaction that required hospitalization: No Has patient had a PCN reaction occurring within the last 10 years: No If all of the above answers are "NO", then may proceed with Cephalosporin use.    Home medications Prior to Admission medications   Medication Sig Start Date End Date Taking? Authorizing Provider  acetaminophen (TYLENOL) 325 MG tablet Take 2 tablets (650 mg total) by mouth every 6 (six) hours as needed for mild pain (Johnson Fever >/= 101). 05/25/19  Yes Swayze, Ava, DO  albuterol (VENTOLIN HFA) 108 (90 Base) MCG/ACT inhaler Inhale 2 puffs into the lungs every 6 (six) hours as needed for wheezing Johnson shortness of breath.  02/02/20  Yes [provider]  allopurinol (ZYLOPRIM) 100 MG tablet Take 1 tablet (100 mg total) by mouth daily. Patient taking differently: Take 100 mg by mouth in the morning.  04/26/19  Yes Mercy Riding, MD  amLODipine (NORVASC) 10 MG tablet Take 10 mg by mouth at bedtime.  08/16/19  Yes [provider]  aspirin EC 81 MG tablet Take 81 mg by mouth at bedtime.   Yes [provider]  cloNIDine (CATAPRES) 0.1 MG tablet Take 0.1 mg by mouth in the morning and at bedtime.    Yes [provider]  Darbepoetin Alfa (ARANESP) 100 MCG/0.5ML SOSY injection Inject 0.5 mLs (100 mcg total) into the vein every Thursday with hemodialysis. 05/01/19  Yes Mercy Riding, MD  levothyroxine (SYNTHROID, LEVOTHROID) 175 MCG tablet Take 175 mcg by mouth daily before breakfast.    Yes [provider]  lidocaine-prilocaine (EMLA) cream Apply 1 application topically Every Tuesday,Thursday,and Saturday with dialysis.  06/24/19  Yes [provider]  Methoxy PEG-Epoetin Beta  (MIRCERA IJ) Inject 30 mg into the vein every 28 (twenty-eight) days.  03/23/20 03/22/21 Yes [provider]  metoprolol tartrate (LOPRESSOR) 50 MG tablet Take 25 mg by mouth 2 (two) times daily.    Yes [provider]  omeprazole (PRILOSEC) 40 MG capsule Take 40 mg by mouth in the morning.  08/15/19  Yes [provider]  pramipexole (MIRAPEX) 0.25 MG tablet Take 0.25 mg by mouth at bedtime. 03/31/20  Yes [provider]  rosuvastatin (CRESTOR) 10 MG tablet Take 10 mg by mouth at bedtime. 01/30/20  Yes [provider]  apixaban (ELIQUIS) 2.5 MG TABS tablet Take 1 tablet (2.5 mg total) by mouth 2 (two) times daily. 05/04/20 06/08/20  Donne Hazel, MD  B Complex-C-Zn-Folic  Acid (DIALYVITE 800-ZINC 15) 0.8 MG TABS Take 1 tablet by mouth daily. 04/22/20   [provider]  HYDROcodone-acetaminophen (NORCO/VICODIN) 5-325 MG tablet Take 1-2 tablets by mouth every 8 (eight) hours as needed for moderate pain Johnson severe pain. 05/04/20   Ainsley Spinner, PA-C  iron sucrose in sodium chloride 0.9 % 100 mL 50 mg. 04/06/20 04/05/21  [provider]      Exam Gen on bipap, ^'d wob, no cyanosis No rash, cyanosis Johnson gangrene Sclera anicteric, throat not seen  JVD to 12 cm Chest velcro rales 1/2 up on R, L basilar rales, no bronch BS Johnson wheezing RRR no MRG Abd soft ntnd no mass Johnson ascites +bs GU normal male MS no joint effusions Johnson deformity Ext 1+ pretib edema, no wounds Johnson ulcers Neuro is alert, Ox 3 , nf LUA AVG+bruit    Home meds:  - norvasc 10/ clonidine 0.1 bid/ metoprolol 25 bid  - mirapex 0.125 tid  - allopurinol 100 qd/ synthroid 200 ug/ prilosec 40 qd/ zocor 20 hs  - prn's/ vitamins/ supplements    Assessment/ Plan: 1. SOB/ acute on chronic resp failure w/ hypoxia - underlying COPD w/ pulm fibrosis by CT, hx SCCa lung cancer last chemo fall 2020.  Unclear cause of acute SOB.  CTA of chest w/o PE.  Radiology reading suggests fluid  excess.  Perhaps there is some consolidation in the bases but not mentioned on the reading.  No PNA symptoms.  Will plan IV ntg and bipap to bridge pt to HD a bit later tonight. Admit to ICU.  Is getting IV solumedrol x 1 empirically w/ hx COPD.  Will start empiric IV abx that may then be withdrawn if CXR / patient improve rapidly w/ HD/ solumedrol.  2. ESRD - HD TTS. Renal aware, they will see tomorrow, have d/w them.  3. HTN - IV ntg for now, resume home meds after HD.  4. SCCa lung - f/b Dr Julien Nordmann.  Last chemo fall 2020, recent CT looked good in March per pts wife.  5. Gout 6. CAD hx of stent        Kelly Splinter  MD 04/05/2020, 6:33 PM

## 2020-05-14 DIAGNOSIS — I509 Heart failure, unspecified: Secondary | ICD-10-CM | POA: Diagnosis not present

## 2020-05-14 DIAGNOSIS — I132 Hypertensive heart and chronic kidney disease with heart failure and with stage 5 chronic kidney disease, or end stage renal disease: Secondary | ICD-10-CM | POA: Diagnosis not present

## 2020-05-14 DIAGNOSIS — S72001G Fracture of unspecified part of neck of right femur, subsequent encounter for closed fracture with delayed healing: Secondary | ICD-10-CM | POA: Diagnosis not present

## 2020-05-14 DIAGNOSIS — I2510 Atherosclerotic heart disease of native coronary artery without angina pectoris: Secondary | ICD-10-CM | POA: Diagnosis not present

## 2020-05-14 DIAGNOSIS — I252 Old myocardial infarction: Secondary | ICD-10-CM | POA: Diagnosis not present

## 2020-05-15 DIAGNOSIS — N186 End stage renal disease: Secondary | ICD-10-CM | POA: Diagnosis not present

## 2020-05-15 DIAGNOSIS — Z992 Dependence on renal dialysis: Secondary | ICD-10-CM | POA: Diagnosis not present

## 2020-05-15 DIAGNOSIS — D689 Coagulation defect, unspecified: Secondary | ICD-10-CM | POA: Diagnosis not present

## 2020-05-15 DIAGNOSIS — N2581 Secondary hyperparathyroidism of renal origin: Secondary | ICD-10-CM | POA: Diagnosis not present

## 2020-05-15 DIAGNOSIS — D509 Iron deficiency anemia, unspecified: Secondary | ICD-10-CM | POA: Diagnosis not present

## 2020-05-15 DIAGNOSIS — E039 Hypothyroidism, unspecified: Secondary | ICD-10-CM | POA: Diagnosis not present

## 2020-05-17 DIAGNOSIS — S72001G Fracture of unspecified part of neck of right femur, subsequent encounter for closed fracture with delayed healing: Secondary | ICD-10-CM | POA: Diagnosis not present

## 2020-05-17 DIAGNOSIS — I2510 Atherosclerotic heart disease of native coronary artery without angina pectoris: Secondary | ICD-10-CM | POA: Diagnosis not present

## 2020-05-17 DIAGNOSIS — I509 Heart failure, unspecified: Secondary | ICD-10-CM | POA: Diagnosis not present

## 2020-05-17 DIAGNOSIS — I252 Old myocardial infarction: Secondary | ICD-10-CM | POA: Diagnosis not present

## 2020-05-17 DIAGNOSIS — I132 Hypertensive heart and chronic kidney disease with heart failure and with stage 5 chronic kidney disease, or end stage renal disease: Secondary | ICD-10-CM | POA: Diagnosis not present

## 2020-05-18 DIAGNOSIS — N2581 Secondary hyperparathyroidism of renal origin: Secondary | ICD-10-CM | POA: Diagnosis not present

## 2020-05-18 DIAGNOSIS — N186 End stage renal disease: Secondary | ICD-10-CM | POA: Diagnosis not present

## 2020-05-18 DIAGNOSIS — Z992 Dependence on renal dialysis: Secondary | ICD-10-CM | POA: Diagnosis not present

## 2020-05-18 DIAGNOSIS — E039 Hypothyroidism, unspecified: Secondary | ICD-10-CM | POA: Diagnosis not present

## 2020-05-18 DIAGNOSIS — D509 Iron deficiency anemia, unspecified: Secondary | ICD-10-CM | POA: Diagnosis not present

## 2020-05-18 DIAGNOSIS — D689 Coagulation defect, unspecified: Secondary | ICD-10-CM | POA: Diagnosis not present

## 2020-05-19 DIAGNOSIS — S72001D Fracture of unspecified part of neck of right femur, subsequent encounter for closed fracture with routine healing: Secondary | ICD-10-CM | POA: Diagnosis not present

## 2020-05-20 DIAGNOSIS — D689 Coagulation defect, unspecified: Secondary | ICD-10-CM | POA: Diagnosis not present

## 2020-05-20 DIAGNOSIS — I2510 Atherosclerotic heart disease of native coronary artery without angina pectoris: Secondary | ICD-10-CM | POA: Diagnosis not present

## 2020-05-20 DIAGNOSIS — D509 Iron deficiency anemia, unspecified: Secondary | ICD-10-CM | POA: Diagnosis not present

## 2020-05-20 DIAGNOSIS — I252 Old myocardial infarction: Secondary | ICD-10-CM | POA: Diagnosis not present

## 2020-05-20 DIAGNOSIS — S72001G Fracture of unspecified part of neck of right femur, subsequent encounter for closed fracture with delayed healing: Secondary | ICD-10-CM | POA: Diagnosis not present

## 2020-05-20 DIAGNOSIS — E039 Hypothyroidism, unspecified: Secondary | ICD-10-CM | POA: Diagnosis not present

## 2020-05-20 DIAGNOSIS — N2581 Secondary hyperparathyroidism of renal origin: Secondary | ICD-10-CM | POA: Diagnosis not present

## 2020-05-20 DIAGNOSIS — I132 Hypertensive heart and chronic kidney disease with heart failure and with stage 5 chronic kidney disease, or end stage renal disease: Secondary | ICD-10-CM | POA: Diagnosis not present

## 2020-05-20 DIAGNOSIS — N186 End stage renal disease: Secondary | ICD-10-CM | POA: Diagnosis not present

## 2020-05-20 DIAGNOSIS — I509 Heart failure, unspecified: Secondary | ICD-10-CM | POA: Diagnosis not present

## 2020-05-20 DIAGNOSIS — Z992 Dependence on renal dialysis: Secondary | ICD-10-CM | POA: Diagnosis not present

## 2020-05-21 DIAGNOSIS — I252 Old myocardial infarction: Secondary | ICD-10-CM | POA: Diagnosis not present

## 2020-05-21 DIAGNOSIS — S72001G Fracture of unspecified part of neck of right femur, subsequent encounter for closed fracture with delayed healing: Secondary | ICD-10-CM | POA: Diagnosis not present

## 2020-05-21 DIAGNOSIS — I509 Heart failure, unspecified: Secondary | ICD-10-CM | POA: Diagnosis not present

## 2020-05-21 DIAGNOSIS — I132 Hypertensive heart and chronic kidney disease with heart failure and with stage 5 chronic kidney disease, or end stage renal disease: Secondary | ICD-10-CM | POA: Diagnosis not present

## 2020-05-21 DIAGNOSIS — I2510 Atherosclerotic heart disease of native coronary artery without angina pectoris: Secondary | ICD-10-CM | POA: Diagnosis not present

## 2020-05-22 DIAGNOSIS — E039 Hypothyroidism, unspecified: Secondary | ICD-10-CM | POA: Diagnosis not present

## 2020-05-22 DIAGNOSIS — Z992 Dependence on renal dialysis: Secondary | ICD-10-CM | POA: Diagnosis not present

## 2020-05-22 DIAGNOSIS — N2581 Secondary hyperparathyroidism of renal origin: Secondary | ICD-10-CM | POA: Diagnosis not present

## 2020-05-22 DIAGNOSIS — D689 Coagulation defect, unspecified: Secondary | ICD-10-CM | POA: Diagnosis not present

## 2020-05-22 DIAGNOSIS — D509 Iron deficiency anemia, unspecified: Secondary | ICD-10-CM | POA: Diagnosis not present

## 2020-05-22 DIAGNOSIS — N186 End stage renal disease: Secondary | ICD-10-CM | POA: Diagnosis not present

## 2020-05-24 DIAGNOSIS — I2510 Atherosclerotic heart disease of native coronary artery without angina pectoris: Secondary | ICD-10-CM | POA: Diagnosis not present

## 2020-05-24 DIAGNOSIS — I252 Old myocardial infarction: Secondary | ICD-10-CM | POA: Diagnosis not present

## 2020-05-24 DIAGNOSIS — S72001G Fracture of unspecified part of neck of right femur, subsequent encounter for closed fracture with delayed healing: Secondary | ICD-10-CM | POA: Diagnosis not present

## 2020-05-24 DIAGNOSIS — I509 Heart failure, unspecified: Secondary | ICD-10-CM | POA: Diagnosis not present

## 2020-05-24 DIAGNOSIS — I132 Hypertensive heart and chronic kidney disease with heart failure and with stage 5 chronic kidney disease, or end stage renal disease: Secondary | ICD-10-CM | POA: Diagnosis not present

## 2020-05-24 NOTE — Op Note (Addendum)
04/30/2020  PATIENT:  Jeremy Johnson  [birth year]  PRE-OPERATIVE DIAGNOSIS:  DISPLACED RIGHT FEMORAL NECK FRACTURE  POST-OPERATIVE DIAGNOSIS:  DISPLACED RIGHT FEMORAL NECK FRACTURE  PROCEDURE:  Procedure(s): UNIPOLAR HEMIARTHROPLASTY OF THE RIGHT HIP with [place] DUOFIX  #6 femoral stem, standard neck, 53 mm head  SURGEON:  Surgeon(s) and Role:    Altamese [place], MD - Primary  PHYSICIAN ASSISTANT: Ainsley Spinner, PA-C  ANESTHESIA:   general  EBL:  200 mL   BLOOD ADMINISTERED:none  DRAINS: none   LOCAL MEDICATIONS USED:  NONE  SPECIMEN:  No Specimen  DISPOSITION OF SPECIMEN:  N/A  COUNTS:  YES  TOURNIQUET:  * No tourniquets in log *  DICTATION: Note written in EPIC  PLAN OF CARE: Admit to inpatient   PATIENT DISPOSITION:  PACU - hemodynamically stable.   Delay start of Pharmacological VTE agent (>24hrs) due to surgical blood loss or risk of bleeding: no  BRIEF SUMMARY OF INDICATION FOR PROCEDURE:  Jeremy Johnson is a very pleasant 74 y.o. with who sustained a fall producing inability to bear weight, shortening, and external rotation of the extremity.  He was seen and evaluated with the recommendation for hemiarthroplasty. I discussed with the patient and family the risks and benefits, inclding the potential for leg length inequality, dislocation or instability, arthritis, loss of motion, DVT, PE, heart attack, stroke, and death.  Consent was given to proceed.  BRIEF SUMMARY OF PROCEDURE:  The patient was taken to the operating room where general anesthesia was induced and after administration of preoperative antibiotics consisting of 2 g of Ancef.  He was positioned with the right side up and all prominences were padded appropriately.  We made a 10 cm incision after the time-out, carrying dissection down to the IT band, was split in line with the skin.  Cerebellar retractor was placed and we were able to then flex and internally rotate the hip releasing the  piriformis and short rotators at their insertions.  The capsule was then T'd, tagging the corners with #1 Vicryl.  The neck cut was refined using a cutting guide and then this was followed by removal of the head, which sized perfectly to 53 mm. Acetabular trials were placed, confirming this size as the best fit. Mueller and Cobra retractors were placed along the proximal femur, which was then prepared with the canal finder, then lateralizer, followed by reamers up to 6, and the broaches, achieving  outstanding fit and fill with the 6 broach. The canal was irrigated thoroughly and the acetabulum once again searched multiple times for fragments and irrigated thoroughly.  Trial components were placed and the patient had outstanding stability in combine 90 degrees of flexion, adduction, and internal rotation as well as in external rotation and extension.  Consequently, actual components were placed.  My assistant Ainsley Spinner, was necessary for delivery and control of the proximal femur during preparation, also during relocation and dislocation of the trial components as well as relocation of the actual components.  He assisted me with wound closure as well.  I did repair the capsule with #1 Vicryl and then used #2 FiberWire through bone tunnels to repair the short rotators and piriformis.  This was followed by a #1 Vicryl for the IT band and lastly 2-0 Vicryl and nylon for the subcutaneous and skin.  Sterile gently compressive dressing was applied.  The patient was awakened from anesthesia and transported to the PACU in stable condition.  PROGNOSIS:  The patient will be  weightbearing as tolerated with posterior hip precautions.  Patient has an elevated risk of complications related to declining overall health and mobility.  [name] remains on the Medical Service and will be on DVT prophylaxis mechanically and with Lovenox while in the hospital with decision re long term agent left to  primary service given his dialysis.     Astrid Divine. Marcelino Scot, M.D.

## 2020-05-25 DIAGNOSIS — D509 Iron deficiency anemia, unspecified: Secondary | ICD-10-CM | POA: Diagnosis not present

## 2020-05-25 DIAGNOSIS — D689 Coagulation defect, unspecified: Secondary | ICD-10-CM | POA: Diagnosis not present

## 2020-05-25 DIAGNOSIS — E039 Hypothyroidism, unspecified: Secondary | ICD-10-CM | POA: Diagnosis not present

## 2020-05-25 DIAGNOSIS — N2581 Secondary hyperparathyroidism of renal origin: Secondary | ICD-10-CM | POA: Diagnosis not present

## 2020-05-25 DIAGNOSIS — N186 End stage renal disease: Secondary | ICD-10-CM | POA: Diagnosis not present

## 2020-05-25 DIAGNOSIS — Z992 Dependence on renal dialysis: Secondary | ICD-10-CM | POA: Diagnosis not present

## 2020-05-26 DIAGNOSIS — S72001G Fracture of unspecified part of neck of right femur, subsequent encounter for closed fracture with delayed healing: Secondary | ICD-10-CM | POA: Diagnosis not present

## 2020-05-26 DIAGNOSIS — I132 Hypertensive heart and chronic kidney disease with heart failure and with stage 5 chronic kidney disease, or end stage renal disease: Secondary | ICD-10-CM | POA: Diagnosis not present

## 2020-05-26 DIAGNOSIS — I252 Old myocardial infarction: Secondary | ICD-10-CM | POA: Diagnosis not present

## 2020-05-26 DIAGNOSIS — I2510 Atherosclerotic heart disease of native coronary artery without angina pectoris: Secondary | ICD-10-CM | POA: Diagnosis not present

## 2020-05-26 DIAGNOSIS — I509 Heart failure, unspecified: Secondary | ICD-10-CM | POA: Diagnosis not present

## 2020-05-27 DIAGNOSIS — D689 Coagulation defect, unspecified: Secondary | ICD-10-CM | POA: Diagnosis not present

## 2020-05-27 DIAGNOSIS — Z992 Dependence on renal dialysis: Secondary | ICD-10-CM | POA: Diagnosis not present

## 2020-05-27 DIAGNOSIS — N2581 Secondary hyperparathyroidism of renal origin: Secondary | ICD-10-CM | POA: Diagnosis not present

## 2020-05-27 DIAGNOSIS — N186 End stage renal disease: Secondary | ICD-10-CM | POA: Diagnosis not present

## 2020-05-27 DIAGNOSIS — E039 Hypothyroidism, unspecified: Secondary | ICD-10-CM | POA: Diagnosis not present

## 2020-05-27 DIAGNOSIS — D509 Iron deficiency anemia, unspecified: Secondary | ICD-10-CM | POA: Diagnosis not present

## 2020-05-28 DIAGNOSIS — I509 Heart failure, unspecified: Secondary | ICD-10-CM | POA: Diagnosis not present

## 2020-05-28 DIAGNOSIS — S72001G Fracture of unspecified part of neck of right femur, subsequent encounter for closed fracture with delayed healing: Secondary | ICD-10-CM | POA: Diagnosis not present

## 2020-05-28 DIAGNOSIS — I2510 Atherosclerotic heart disease of native coronary artery without angina pectoris: Secondary | ICD-10-CM | POA: Diagnosis not present

## 2020-05-28 DIAGNOSIS — I132 Hypertensive heart and chronic kidney disease with heart failure and with stage 5 chronic kidney disease, or end stage renal disease: Secondary | ICD-10-CM | POA: Diagnosis not present

## 2020-05-28 DIAGNOSIS — I252 Old myocardial infarction: Secondary | ICD-10-CM | POA: Diagnosis not present

## 2020-05-29 DIAGNOSIS — D509 Iron deficiency anemia, unspecified: Secondary | ICD-10-CM | POA: Diagnosis not present

## 2020-05-29 DIAGNOSIS — N186 End stage renal disease: Secondary | ICD-10-CM | POA: Diagnosis not present

## 2020-05-29 DIAGNOSIS — D689 Coagulation defect, unspecified: Secondary | ICD-10-CM | POA: Diagnosis not present

## 2020-05-29 DIAGNOSIS — Z992 Dependence on renal dialysis: Secondary | ICD-10-CM | POA: Diagnosis not present

## 2020-05-29 DIAGNOSIS — N2581 Secondary hyperparathyroidism of renal origin: Secondary | ICD-10-CM | POA: Diagnosis not present

## 2020-05-29 DIAGNOSIS — E039 Hypothyroidism, unspecified: Secondary | ICD-10-CM | POA: Diagnosis not present

## 2020-05-31 DIAGNOSIS — J449 Chronic obstructive pulmonary disease, unspecified: Secondary | ICD-10-CM | POA: Diagnosis not present

## 2020-05-31 DIAGNOSIS — I252 Old myocardial infarction: Secondary | ICD-10-CM | POA: Diagnosis not present

## 2020-05-31 DIAGNOSIS — S72001G Fracture of unspecified part of neck of right femur, subsequent encounter for closed fracture with delayed healing: Secondary | ICD-10-CM | POA: Diagnosis not present

## 2020-05-31 DIAGNOSIS — I509 Heart failure, unspecified: Secondary | ICD-10-CM | POA: Diagnosis not present

## 2020-05-31 DIAGNOSIS — J9611 Chronic respiratory failure with hypoxia: Secondary | ICD-10-CM | POA: Diagnosis not present

## 2020-05-31 DIAGNOSIS — N186 End stage renal disease: Secondary | ICD-10-CM | POA: Diagnosis not present

## 2020-05-31 DIAGNOSIS — I2510 Atherosclerotic heart disease of native coronary artery without angina pectoris: Secondary | ICD-10-CM | POA: Diagnosis not present

## 2020-05-31 DIAGNOSIS — I132 Hypertensive heart and chronic kidney disease with heart failure and with stage 5 chronic kidney disease, or end stage renal disease: Secondary | ICD-10-CM | POA: Diagnosis not present

## 2020-06-01 DIAGNOSIS — N2581 Secondary hyperparathyroidism of renal origin: Secondary | ICD-10-CM | POA: Diagnosis not present

## 2020-06-01 DIAGNOSIS — D509 Iron deficiency anemia, unspecified: Secondary | ICD-10-CM | POA: Diagnosis not present

## 2020-06-01 DIAGNOSIS — D689 Coagulation defect, unspecified: Secondary | ICD-10-CM | POA: Diagnosis not present

## 2020-06-01 DIAGNOSIS — N186 End stage renal disease: Secondary | ICD-10-CM | POA: Diagnosis not present

## 2020-06-01 DIAGNOSIS — Z992 Dependence on renal dialysis: Secondary | ICD-10-CM | POA: Diagnosis not present

## 2020-06-01 DIAGNOSIS — E039 Hypothyroidism, unspecified: Secondary | ICD-10-CM | POA: Diagnosis not present

## 2020-06-01 DIAGNOSIS — J449 Chronic obstructive pulmonary disease, unspecified: Secondary | ICD-10-CM | POA: Diagnosis not present

## 2020-06-02 DIAGNOSIS — I252 Old myocardial infarction: Secondary | ICD-10-CM | POA: Diagnosis not present

## 2020-06-02 DIAGNOSIS — S72001G Fracture of unspecified part of neck of right femur, subsequent encounter for closed fracture with delayed healing: Secondary | ICD-10-CM | POA: Diagnosis not present

## 2020-06-02 DIAGNOSIS — I2510 Atherosclerotic heart disease of native coronary artery without angina pectoris: Secondary | ICD-10-CM | POA: Diagnosis not present

## 2020-06-02 DIAGNOSIS — I509 Heart failure, unspecified: Secondary | ICD-10-CM | POA: Diagnosis not present

## 2020-06-02 DIAGNOSIS — I132 Hypertensive heart and chronic kidney disease with heart failure and with stage 5 chronic kidney disease, or end stage renal disease: Secondary | ICD-10-CM | POA: Diagnosis not present

## 2020-06-03 DIAGNOSIS — N2581 Secondary hyperparathyroidism of renal origin: Secondary | ICD-10-CM | POA: Diagnosis not present

## 2020-06-03 DIAGNOSIS — D689 Coagulation defect, unspecified: Secondary | ICD-10-CM | POA: Diagnosis not present

## 2020-06-03 DIAGNOSIS — D509 Iron deficiency anemia, unspecified: Secondary | ICD-10-CM | POA: Diagnosis not present

## 2020-06-03 DIAGNOSIS — E039 Hypothyroidism, unspecified: Secondary | ICD-10-CM | POA: Diagnosis not present

## 2020-06-03 DIAGNOSIS — Z992 Dependence on renal dialysis: Secondary | ICD-10-CM | POA: Diagnosis not present

## 2020-06-03 DIAGNOSIS — N186 End stage renal disease: Secondary | ICD-10-CM | POA: Diagnosis not present

## 2020-06-04 DIAGNOSIS — T782XXA Anaphylactic shock, unspecified, initial encounter: Secondary | ICD-10-CM | POA: Insufficient documentation

## 2020-06-04 DIAGNOSIS — T7840XA Allergy, unspecified, initial encounter: Secondary | ICD-10-CM | POA: Insufficient documentation

## 2020-06-05 DIAGNOSIS — Z992 Dependence on renal dialysis: Secondary | ICD-10-CM | POA: Diagnosis not present

## 2020-06-05 DIAGNOSIS — N2581 Secondary hyperparathyroidism of renal origin: Secondary | ICD-10-CM | POA: Diagnosis not present

## 2020-06-05 DIAGNOSIS — D689 Coagulation defect, unspecified: Secondary | ICD-10-CM | POA: Diagnosis not present

## 2020-06-05 DIAGNOSIS — N186 End stage renal disease: Secondary | ICD-10-CM | POA: Diagnosis not present

## 2020-06-05 DIAGNOSIS — D509 Iron deficiency anemia, unspecified: Secondary | ICD-10-CM | POA: Diagnosis not present

## 2020-06-05 DIAGNOSIS — E039 Hypothyroidism, unspecified: Secondary | ICD-10-CM | POA: Diagnosis not present

## 2020-06-06 DIAGNOSIS — C3490 Malignant neoplasm of unspecified part of unspecified bronchus or lung: Secondary | ICD-10-CM | POA: Diagnosis not present

## 2020-06-06 DIAGNOSIS — J449 Chronic obstructive pulmonary disease, unspecified: Secondary | ICD-10-CM | POA: Diagnosis not present

## 2020-06-07 ENCOUNTER — Encounter: Payer: Self-pay | Admitting: Dermatology

## 2020-06-07 ENCOUNTER — Ambulatory Visit (INDEPENDENT_AMBULATORY_CARE_PROVIDER_SITE_OTHER): Payer: Medicare Other | Admitting: Dermatology

## 2020-06-07 ENCOUNTER — Other Ambulatory Visit: Payer: Self-pay

## 2020-06-07 DIAGNOSIS — S72001G Fracture of unspecified part of neck of right femur, subsequent encounter for closed fracture with delayed healing: Secondary | ICD-10-CM | POA: Diagnosis not present

## 2020-06-07 DIAGNOSIS — L578 Other skin changes due to chronic exposure to nonionizing radiation: Secondary | ICD-10-CM | POA: Diagnosis not present

## 2020-06-07 DIAGNOSIS — L858 Other specified epidermal thickening: Secondary | ICD-10-CM | POA: Diagnosis not present

## 2020-06-07 DIAGNOSIS — D485 Neoplasm of uncertain behavior of skin: Secondary | ICD-10-CM

## 2020-06-07 DIAGNOSIS — I2510 Atherosclerotic heart disease of native coronary artery without angina pectoris: Secondary | ICD-10-CM | POA: Diagnosis not present

## 2020-06-07 DIAGNOSIS — D692 Other nonthrombocytopenic purpura: Secondary | ICD-10-CM

## 2020-06-07 DIAGNOSIS — I252 Old myocardial infarction: Secondary | ICD-10-CM | POA: Diagnosis not present

## 2020-06-07 DIAGNOSIS — I132 Hypertensive heart and chronic kidney disease with heart failure and with stage 5 chronic kidney disease, or end stage renal disease: Secondary | ICD-10-CM | POA: Diagnosis not present

## 2020-06-07 DIAGNOSIS — I509 Heart failure, unspecified: Secondary | ICD-10-CM | POA: Diagnosis not present

## 2020-06-07 DIAGNOSIS — L821 Other seborrheic keratosis: Secondary | ICD-10-CM

## 2020-06-07 NOTE — Progress Notes (Signed)
   Follow-Up Visit   Subjective  Jeremy Johnson is a 74 y.o. male who presents for the following: Rash (of back and chest x couple months that is very itch ).  The following portions of the chart were reviewed this encounter and updated as appropriate:  Tobacco  Allergies  Meds  Problems  Med Hx  Surg Hx  Fam Hx     Review of Systems:  No other skin or systemic complaints except as noted in HPI or Assessment and Plan.  Objective  Well appearing patient in no apparent distress; mood and affect are within normal limits.  A focused examination was performed including chest, abdomen and back. Relevant physical exam findings are noted in the Assessment and Plan.  Objective  Right Upper Back: Pink 1-2 mm papules scattered over chest, abdomen and back.   Assessment & Plan    Neoplasm of uncertain behavior of skin Right Upper Back -  Rash of chest; back and abdomen = Inflamed Seborrheic Keratoses vs Grover's Disease  Skin / nail biopsy Type of biopsy: tangential   Informed consent: discussed and consent obtained   Timeout: patient name, date of birth, surgical site, and procedure verified   Procedure prep:  Patient was prepped and draped in usual sterile fashion Prep type:  Isopropyl alcohol Anesthesia: the lesion was anesthetized in a standard fashion   Anesthetic:  1% lidocaine w/ epinephrine 1-100,000 buffered w/ 8.4% NaHCO3 Instrument used: flexible razor blade   Hemostasis achieved with: pressure, aluminum chloride and electrodesiccation   Outcome: patient tolerated procedure well   Post-procedure details: sterile dressing applied and wound care instructions given   Dressing type: bandage and petrolatum    Specimen 1 - Surgical pathology Differential Diagnosis: Inflamed SK vs Grover's Disease vs other  Check Margins: No Pink 1-2 mm papules scattered over chest, abdomen and back.   Actinic Damage - diffuse scaly erythematous macules with underlying dyspigmentation -  Recommend daily broad spectrum sunscreen SPF 30+ to sun-exposed areas, reapply every 2 hours as needed.  - Call for new or changing lesions.  Purpura - Violaceous macules and patches - Benign - Related to age, sun damage and/or use of blood thinners - Observe - Can use OTC arnica containing moisturizer such as Dermend Bruise Formula if desired - Call for worsening or other concerns  Seborrheic Keratoses - Stuck-on, waxy, tan-brown papules and plaques  - Discussed benign etiology and prognosis. - Observe - Call for any changes   Return in about 2 weeks (around 06/21/2020).  I, Ashok Cordia, CMA, am acting as scribe for Sarina Ser, MD .  Documentation: I have reviewed the above documentation for accuracy and completeness, and I agree with the above.  Sarina Ser, MD

## 2020-06-07 NOTE — Patient Instructions (Signed)

## 2020-06-08 DIAGNOSIS — Z992 Dependence on renal dialysis: Secondary | ICD-10-CM | POA: Diagnosis not present

## 2020-06-08 DIAGNOSIS — D689 Coagulation defect, unspecified: Secondary | ICD-10-CM | POA: Diagnosis not present

## 2020-06-08 DIAGNOSIS — D509 Iron deficiency anemia, unspecified: Secondary | ICD-10-CM | POA: Diagnosis not present

## 2020-06-08 DIAGNOSIS — N2581 Secondary hyperparathyroidism of renal origin: Secondary | ICD-10-CM | POA: Diagnosis not present

## 2020-06-08 DIAGNOSIS — E039 Hypothyroidism, unspecified: Secondary | ICD-10-CM | POA: Diagnosis not present

## 2020-06-08 DIAGNOSIS — N186 End stage renal disease: Secondary | ICD-10-CM | POA: Diagnosis not present

## 2020-06-09 ENCOUNTER — Telehealth: Payer: Self-pay

## 2020-06-09 ENCOUNTER — Ambulatory Visit (INDEPENDENT_AMBULATORY_CARE_PROVIDER_SITE_OTHER): Payer: Medicare Other | Admitting: Gastroenterology

## 2020-06-09 ENCOUNTER — Encounter: Payer: Self-pay | Admitting: Gastroenterology

## 2020-06-09 ENCOUNTER — Other Ambulatory Visit: Payer: Self-pay

## 2020-06-09 DIAGNOSIS — R634 Abnormal weight loss: Secondary | ICD-10-CM | POA: Diagnosis not present

## 2020-06-09 DIAGNOSIS — R6881 Early satiety: Secondary | ICD-10-CM | POA: Diagnosis not present

## 2020-06-09 MED ORDER — TRIAMCINOLONE ACETONIDE 0.1 % EX CREA
TOPICAL_CREAM | CUTANEOUS | 1 refills | Status: DC
Start: 2020-06-09 — End: 2020-09-25

## 2020-06-09 NOTE — Patient Instructions (Signed)
We are arranging an upper endoscopy with Dr. Abbey Chatters in the near future.  I am reaching out to your Oncologist regarding a CT scan, so we can group this together.  Further recommendations to follow!  It was a pleasure to see you today. I want to create trusting relationships with patients to provide genuine, compassionate, and quality care. I value your feedback. If you receive a survey regarding your visit,  I greatly appreciate you taking time to fill this out.   Annitta Needs, PhD, ANP-BC [facility]

## 2020-06-09 NOTE — Telephone Encounter (Signed)
-----   Message from Ralene Bathe, MD sent at 06/09/2020 11:48 AM EDT ----- Skin , right upper back FOCAL ACANTHOLYTIC DYSKERATOSIS, SEE DESCRIPTION  Most consistent with Grover's disease May start Triamcinolone 0.1% cream bid disp 1 pound with 1 rf Make pt appt for 4-6 wks.

## 2020-06-09 NOTE — Telephone Encounter (Signed)
Patient informed of results and medication sent to pharmacy.

## 2020-06-09 NOTE — Progress Notes (Signed)
Primary Care Physician:  [name], MD  Referring Physician: Dr. Willey Blade  Primary Gastroenterologist:  Dr. Abbey Chatters   Chief Complaint  Patient presents with  . Dysphagia    can't taste much; has had radiation/chemo    HPI:   Jeremy Johnson is a 74 y.o. male presenting today at the request of Dr. Willey Blade due to concerns for dysphagia. He has a history of small cell lung cancer diagnosed in March 2020, undergoing 4 cycles of chemo and currently on observation status. CT chest is due for surveillance in Sept 2020. Colonoscopy on file from 2019 with surveillance due in Nov 2022 due to multiple adenomas.   He reports losing weight, no appetite, and no taste for the past several months. He actually denies any solid food dysphagia, endorsing rather no taste. He states having no taste makes it hard for him to eat. He recalls weighing 208 in Feb 2020. I reviewed weights in Epic, and he was in the high 190s prior to diagnosis. His weight dropped to low 180s Oct 2020, with slowly declining since Jan 2021. He has lost 24 lbs this year alone (since Jan 2021). No overt GI bleeding.   Denies abdominal pain, N/V. Omeprazole was increased from 20 mg to 40 mg recently by PCP, but he has not had any improvement in appetite. Dialysis on Tues, Thurs, Saturday.   His Hgb has been slowly trending down over the months. 11 range earlier this year, 10 in June 20201, and now most recently 8.2.      Past Medical History:  Diagnosis Date  . Anemia   . Blood transfusion without reported diagnosis   . CAD (coronary artery disease)    STENT... MID CIRCUMFLEX...1997  . Chronic kidney disease    STAGE 3  . COPD (chronic obstructive pulmonary disease) ([place])   . Degenerative joint disease (DJD) of lumbar spine   . GERD (gastroesophageal reflux disease)   . Gout   . Hyperlipidemia   . Hypertension   . Hypothyroidism   . Incisional hernia    abdomen  . Leukocytosis    CHRONIC MILD  . Myocardial infarction  ([place])    1997  . SCL CA dx'd 01/2019   Lung cancer    Past Surgical History:  Procedure Laterality Date  . ABDOMINAL AORTIC ANEURYSM REPAIR  2006  . AV FISTULA PLACEMENT Left 04/25/2019   Procedure: ARTERIOVENOUS (AV) FISTULA CREATION LEFT ARM;  Surgeon: Angelia Mould, MD;  Location: [place];  Service: Vascular;  Laterality: Left;  . AV FISTULA PLACEMENT Left 05/19/2019   Procedure: CONVERSION OF LEFT ARM ARTERIOVENOUS FISTULA TO GRAFT;  Surgeon: Angelia Mould, MD;  Location: [place];  Service: Vascular;  Laterality: Left;  . BIOPSY  09/30/2018   Procedure: BIOPSY;  Surgeon: Danie Binder, MD;  [facility];  Service: Endoscopy;;  ascending colon  . COLONOSCOPY  2008  . COLONOSCOPY N/A 09/30/2018   External and internal hemorrhoids, six polyps removed, one ascending colon polypoid lesion biopsied. Six simple adenomas and one benign polypoid lesion. Colonoscopy Nov 2022.   [name] [place]  . HIP ARTHROPLASTY Right 04/30/2020   Procedure: ARTHROPLASTY  HIP (HEMIARTHROPLASTY);  Surgeon: Altamese Le[place], MD;  Location: [place];  Service: Orthopedics;  Laterality: Right;  . IR FLUORO GUIDE CV LINE RIGHT  04/22/2019  . IR FLUORO GUIDE CV LINE RIGHT  05/01/2020  . IR THROMBECTOMY AV  FISTULA W/THROMBOLYSIS/PTA INC/SHUNT/IMG LEFT Left 05/03/2020  . IR US GUIDE VASC ACCESS LEFT  05/03/2020  . IR US GUIDE VASC ACCESS RIGHT  04/22/2019  . IR US GUIDE VASC ACCESS RIGHT  05/01/2020  . POLYPECTOMY  09/30/2018   Procedure: POLYPECTOMY;  Surgeon: Danie Binder, MD;  [facility];  Service: Endoscopy;;  colon  . VIDEO BRONCHOSCOPY WITH ENDOBRONCHIAL NAVIGATION N/A 01/27/2019   Procedure: VIDEO BRONCHOSCOPY WITH ENDOBRONCHIAL NAVIGATION;  Surgeon: Grace Isaac, MD;  Location: [place];  Service: Thoracic;  Laterality: N/A;  . VIDEO BRONCHOSCOPY WITH ENDOBRONCHIAL ULTRASOUND N/A 01/27/2019   Procedure: VIDEO BRONCHOSCOPY WITH  ENDOBRONCHIAL ULTRASOUND;  Surgeon: Grace Isaac, MD;  Location: [place];  Service: Thoracic;  Laterality: N/A;    Current Outpatient Medications  Medication Sig Dispense Refill  . acetaminophen (TYLENOL) 325 MG tablet Take 2 tablets (650 mg total) by mouth every 6 (six) hours as needed for mild pain (or Fever >/= 101). 30 tablet 0  . albuterol (VENTOLIN HFA) 108 (90 Base) MCG/ACT inhaler Inhale 2 puffs into the lungs every 6 (six) hours as needed for wheezing or shortness of breath.     . allopurinol (ZYLOPRIM) 100 MG tablet Take 1 tablet (100 mg total) by mouth daily. (Patient taking differently: Take 100 mg by mouth in the morning. ) 30 tablet 1  . amLODipine (NORVASC) 10 MG tablet Take 10 mg by mouth at bedtime.     [name] aspirin EC 81 MG tablet Take 81 mg by mouth at bedtime.    . B Complex-C-Zn-Folic Acid (DIALYVITE 267-TIWP 15) 0.8 MG TABS Take 1 tablet by mouth daily.    . Darbepoetin Alfa (ARANESP) 100 MCG/0.5ML SOSY injection Inject 0.5 mLs (100 mcg total) into the vein every Thursday with hemodialysis. 4.2 mL   . iron sucrose in sodium chloride 0.9 % 100 mL 50 mg.    . levothyroxine (SYNTHROID, LEVOTHROID) 175 MCG tablet Take 175 mcg by mouth daily before breakfast.     . lidocaine-prilocaine (EMLA) cream Apply 1 application topically Every Tuesday,Thursday,and Saturday with dialysis.     . Methoxy PEG-Epoetin Beta (MIRCERA IJ) Inject 30 mg into the vein every 28 (twenty-eight) days.     . metoprolol tartrate (LOPRESSOR) 50 MG tablet Take 25 mg by mouth 2 (two) times daily.     [name] omeprazole (PRILOSEC) 40 MG capsule Take 40 mg by mouth in the morning.     . pramipexole (MIRAPEX) 0.25 MG tablet Take 0.25 mg by mouth at bedtime.    . rosuvastatin (CRESTOR) 10 MG tablet Take 10 mg by mouth at bedtime.    . triamcinolone cream (KENALOG) 0.1 % Apply to aa's rash BID until clear then PRN thereafter. Avoid face, groin, and axilla. 453.6 g 1   No current facility-administered medications for  this visit.    Allergies as of 06/09/2020 - Review Complete 06/09/2020  Allergen Reaction Noted  . Advair hfa [fluticasone-salmeterol] Other (See Comments) 04/29/2020  . Penicillins Rash 02/09/2010    Family History  Problem Relation Age of Onset  . Stroke Brother   . Lung cancer Sister 50       lung cancer/former  . Colon cancer Neg Hx   . Colon polyps Neg Hx     Social History   Socioeconomic History  . Marital status: Married    Spouse name: Not on file  . Number of children: 2  . Years of education: Not on file  . Highest education level: Not  on file  Occupational History    Comment: retired  Tobacco Use  . Smoking status: Former Smoker    Packs/day: 0.50    Years: 54.00    Pack years: 27.00    Quit date: 09/17/2017    Years since quitting: 2.7  . Smokeless tobacco: Never Used  Vaping Use  . Vaping Use: Never used  Substance and Sexual Activity  . Alcohol use: Not Currently    Comment: occasional  . Drug use: No  . Sexual activity: Not Currently  Other Topics Concern  . Not on file  Social History Narrative  . Not on file   Social Determinants of Health   Financial Resource Strain:   . Difficulty of Paying Living Expenses:   Food Insecurity:   . Worried About Charity fundraiser in the Last Year:   . Arboriculturist in the Last Year:   Transportation Needs:   . Film/video editor (Medical):   [name] Lack of Transportation (Non-Medical):   Physical Activity:   . Days of Exercise per Week:   . Minutes of Exercise per Session:   Stress:   . Feeling of Stress :   Social Connections:   . Frequency of Communication with Friends and Family:   . Frequency of Social Gatherings with Friends and Family:   . Attends Religious Services:   . Active Member of Clubs or Organizations:   . Attends Archivist Meetings:   [name] Marital Status:   Intimate Partner Violence:   . Fear of Current or Ex-Partner:   . Emotionally Abused:   [name] Physically Abused:     . Sexually Abused:     Review of Systems: Gen: see HPI CV: Denies chest pain, heart palpitations, peripheral edema, syncope.  Resp: Denies shortness of breath at rest or with exertion. Denies wheezing or cough.  GI: see HPI GU : Denies urinary burning, urinary frequency, urinary hesitancy MS: Denies joint pain, muscle weakness, cramps, or limitation of movement.  Derm: Denies rash, itching, dry skin Psych: Denies depression, anxiety, memory loss, and confusion Heme: Denies bruising, bleeding, and enlarged lymph nodes.  Physical Exam: BP (!) 129/72   Pulse 71   Temp (!) 97.3 F (36.3 C) (Temporal)   Ht 5\' 9"  (1.753 m)   Wt 154 lb 3.2 oz (69.9 kg)   BMI 22.77 kg/m  General:   Alert and oriented. Pleasant and cooperative. Thin but not cachectic-appearing Head:  Normocephalic and atraumatic. Eyes:  Without icterus, sclera clear and conjunctiva pink.  Ears:  Normal auditory acuity. Mouth:  No deformity or lesions, oral mucosa pink.  Lungs:  Clear to auscultation bilaterally.  Heart:  S1, S2 present without murmurs appreciated.  Abdomen:  +BS, soft, non-tender and non-distended. No HSM noted. No guarding or rebound. No masses appreciated.  Rectal:  Deferred  Msk:  Symmetrical without gross deformities. Normal posture. Extremities:  Without edema. Neurologic:  Alert and  oriented x4;  grossly normal neurologically. Skin:  Intact without significant lesions or rashes. Psych:  Alert and cooperative. Normal mood and affect.  ASSESSMENT: Jeremy Johnson is a 74 y.o. male presenting today at request of Dr. Willey Blade due to dysphagia; however, he is denying solid food dysphagia and rather endorses moreso lack of appetite, loss of taste, and early satiety. Known history of small cell lung cancer last year s/p chemo and now in observation mode. CT chest due in Sept 2021. Colonoscopy in 2019 with multiple adenomas, with surveillance due  Nov 2022. No prior EGD.   I have reviewed trends of  weight, and he has had documented unintentional weight loss, specifically since beginning of the year. Hgb has been trending down as well over past few months, likely due to anemia of chronic disease but I am concerned about an IDA component. Need to update CBC and iron studies.  In light of weight loss, worsening anemia, and constellation of symptoms, recommend EGD along with colonoscopy in near future. If these are negative, I recommend CT abd/pelvis. He could potentially have this with contrast with dialysis to follow. Keep plans for CT chest in Sept 2022. If possible, these could be performed together if colonoscopy/EGD is already completed by that time.    PLAN:  Proceed with TCS/EGD with Dr. Abbey Chatters in near future: the risks, benefits, and alternatives have been discussed with the patient in detail. The patient states understanding and desires to proceed.  Update CBC and iron studies now  If endoscopic evaluation negative, recommend CT abdomen/pelvis  Annitta Needs, PhD, ANP-BC [facility]

## 2020-06-10 DIAGNOSIS — D689 Coagulation defect, unspecified: Secondary | ICD-10-CM | POA: Diagnosis not present

## 2020-06-10 DIAGNOSIS — N186 End stage renal disease: Secondary | ICD-10-CM | POA: Diagnosis not present

## 2020-06-10 DIAGNOSIS — Z992 Dependence on renal dialysis: Secondary | ICD-10-CM | POA: Diagnosis not present

## 2020-06-10 DIAGNOSIS — D509 Iron deficiency anemia, unspecified: Secondary | ICD-10-CM | POA: Diagnosis not present

## 2020-06-10 DIAGNOSIS — N2581 Secondary hyperparathyroidism of renal origin: Secondary | ICD-10-CM | POA: Diagnosis not present

## 2020-06-10 DIAGNOSIS — E039 Hypothyroidism, unspecified: Secondary | ICD-10-CM | POA: Diagnosis not present

## 2020-06-11 DIAGNOSIS — I252 Old myocardial infarction: Secondary | ICD-10-CM | POA: Diagnosis not present

## 2020-06-11 DIAGNOSIS — I132 Hypertensive heart and chronic kidney disease with heart failure and with stage 5 chronic kidney disease, or end stage renal disease: Secondary | ICD-10-CM | POA: Diagnosis not present

## 2020-06-11 DIAGNOSIS — S72001G Fracture of unspecified part of neck of right femur, subsequent encounter for closed fracture with delayed healing: Secondary | ICD-10-CM | POA: Diagnosis not present

## 2020-06-11 DIAGNOSIS — I2510 Atherosclerotic heart disease of native coronary artery without angina pectoris: Secondary | ICD-10-CM | POA: Diagnosis not present

## 2020-06-11 DIAGNOSIS — I509 Heart failure, unspecified: Secondary | ICD-10-CM | POA: Diagnosis not present

## 2020-06-12 DIAGNOSIS — Z992 Dependence on renal dialysis: Secondary | ICD-10-CM | POA: Diagnosis not present

## 2020-06-12 DIAGNOSIS — N186 End stage renal disease: Secondary | ICD-10-CM | POA: Diagnosis not present

## 2020-06-12 DIAGNOSIS — I129 Hypertensive chronic kidney disease with stage 1 through stage 4 chronic kidney disease, or unspecified chronic kidney disease: Secondary | ICD-10-CM | POA: Diagnosis not present

## 2020-06-12 DIAGNOSIS — E039 Hypothyroidism, unspecified: Secondary | ICD-10-CM | POA: Diagnosis not present

## 2020-06-12 DIAGNOSIS — N2581 Secondary hyperparathyroidism of renal origin: Secondary | ICD-10-CM | POA: Diagnosis not present

## 2020-06-12 DIAGNOSIS — D509 Iron deficiency anemia, unspecified: Secondary | ICD-10-CM | POA: Diagnosis not present

## 2020-06-12 DIAGNOSIS — D689 Coagulation defect, unspecified: Secondary | ICD-10-CM | POA: Diagnosis not present

## 2020-06-14 DIAGNOSIS — S72001D Fracture of unspecified part of neck of right femur, subsequent encounter for closed fracture with routine healing: Secondary | ICD-10-CM | POA: Diagnosis not present

## 2020-06-15 ENCOUNTER — Telehealth: Payer: Self-pay | Admitting: *Deleted

## 2020-06-15 DIAGNOSIS — Z992 Dependence on renal dialysis: Secondary | ICD-10-CM | POA: Diagnosis not present

## 2020-06-15 DIAGNOSIS — D689 Coagulation defect, unspecified: Secondary | ICD-10-CM | POA: Diagnosis not present

## 2020-06-15 DIAGNOSIS — N2581 Secondary hyperparathyroidism of renal origin: Secondary | ICD-10-CM | POA: Diagnosis not present

## 2020-06-15 DIAGNOSIS — N186 End stage renal disease: Secondary | ICD-10-CM | POA: Diagnosis not present

## 2020-06-15 NOTE — Telephone Encounter (Signed)
Pt called in to follow up with [place] since his last ov with AB.  He said we were supposed to be scheduling him for some tests but he hasn't heard from [place].  Informed pt that it may be because our Sept procedure schedules have not been released yet.  Informed him that I would double check just to make sure. There is mention of AB reaching out to oncologist regarding a CT scan as well.  Routing to clinical pool and AB.

## 2020-06-15 NOTE — Telephone Encounter (Signed)
Thanks, Angie, yes it is on my list to do! He needs the procedure regardless. I am still determining imaging.

## 2020-06-16 ENCOUNTER — Other Ambulatory Visit: Payer: Self-pay | Admitting: Emergency Medicine

## 2020-06-16 ENCOUNTER — Encounter: Payer: Self-pay | Admitting: Gastroenterology

## 2020-06-16 DIAGNOSIS — R634 Abnormal weight loss: Secondary | ICD-10-CM

## 2020-06-16 DIAGNOSIS — Z1211 Encounter for screening for malignant neoplasm of colon: Secondary | ICD-10-CM

## 2020-06-16 NOTE — Telephone Encounter (Signed)
Sending to AB as an [place]

## 2020-06-16 NOTE — Telephone Encounter (Signed)
Orders placed in epic sent to [facility] pt notified

## 2020-06-16 NOTE — Telephone Encounter (Signed)
Pt is agreeable to have tcs/egd and ct of abd and pelvis

## 2020-06-16 NOTE — Telephone Encounter (Signed)
Noted. Will call patient once we receive schedule

## 2020-06-16 NOTE — Telephone Encounter (Signed)
Noted. Phone note printed off and added to encounter form to be called

## 2020-06-16 NOTE — Telephone Encounter (Signed)
As per below, only do TCS/EGD for now.

## 2020-06-16 NOTE — Telephone Encounter (Addendum)
Please let patient know that after review of his chart, labs, and his presenting symptoms, I feel we should go ahead and arrange a colonoscopy at time of EGD (EGD had already been recommended). He is already due for colonoscopy this year, and I notice his Hgb has been trending down. Anemia likely multifactorial in setting of chronic disease, but I would like to update CBC, iron studies now. Please make sure he is on board with colonoscopy to be added. History of multiple adenomas.   The plan for now is this: colonoscopy/EGD with Dr. Abbey Chatters. IF endoscopic findings unrevealing, then I would recommend a CT abd/pelvis at that time.   Magda Paganini: can we have patient complete CBC and iron studies to have on file?   [facility] clinical pool: please add TCS at time of EGD for patient.

## 2020-06-17 DIAGNOSIS — N186 End stage renal disease: Secondary | ICD-10-CM | POA: Diagnosis not present

## 2020-06-17 DIAGNOSIS — D689 Coagulation defect, unspecified: Secondary | ICD-10-CM | POA: Diagnosis not present

## 2020-06-17 DIAGNOSIS — Z992 Dependence on renal dialysis: Secondary | ICD-10-CM | POA: Diagnosis not present

## 2020-06-17 DIAGNOSIS — N2581 Secondary hyperparathyroidism of renal origin: Secondary | ICD-10-CM | POA: Diagnosis not present

## 2020-06-17 NOTE — Progress Notes (Signed)
Cc'ed to pcp °

## 2020-06-19 DIAGNOSIS — N186 End stage renal disease: Secondary | ICD-10-CM | POA: Diagnosis not present

## 2020-06-19 DIAGNOSIS — Z992 Dependence on renal dialysis: Secondary | ICD-10-CM | POA: Diagnosis not present

## 2020-06-19 DIAGNOSIS — N2581 Secondary hyperparathyroidism of renal origin: Secondary | ICD-10-CM | POA: Diagnosis not present

## 2020-06-19 DIAGNOSIS — D689 Coagulation defect, unspecified: Secondary | ICD-10-CM | POA: Diagnosis not present

## 2020-06-21 ENCOUNTER — Telehealth: Payer: Self-pay | Admitting: Internal Medicine

## 2020-06-21 ENCOUNTER — Ambulatory Visit (INDEPENDENT_AMBULATORY_CARE_PROVIDER_SITE_OTHER): Payer: Medicare Other | Admitting: Dermatology

## 2020-06-21 ENCOUNTER — Other Ambulatory Visit: Payer: Self-pay

## 2020-06-21 ENCOUNTER — Telehealth: Payer: Self-pay | Admitting: *Deleted

## 2020-06-21 DIAGNOSIS — R634 Abnormal weight loss: Secondary | ICD-10-CM | POA: Diagnosis not present

## 2020-06-21 DIAGNOSIS — D649 Anemia, unspecified: Secondary | ICD-10-CM | POA: Diagnosis not present

## 2020-06-21 DIAGNOSIS — L111 Transient acantholytic dermatosis [Grover]: Secondary | ICD-10-CM

## 2020-06-21 LAB — CBC WITH DIFFERENTIAL/PLATELET
Absolute Monocytes: 630 cells/uL (ref 200–950)
Basophils Absolute: 69 cells/uL (ref 0–200)
Basophils Relative: 1.1 %
Eosinophils Absolute: 189 cells/uL (ref 15–500)
Eosinophils Relative: 3 %
HCT: 28 % — ABNORMAL LOW (ref 38.5–50.0)
Hemoglobin: 9.2 g/dL — ABNORMAL LOW (ref 13.2–17.1)
Lymphs Abs: 1959 cells/uL (ref 850–3900)
MCH: 33.1 pg — ABNORMAL HIGH (ref 27.0–33.0)
MCHC: 32.9 g/dL (ref 32.0–36.0)
MCV: 100.7 fL — ABNORMAL HIGH (ref 80.0–100.0)
MPV: 10 fL (ref 7.5–12.5)
Monocytes Relative: 10 %
Neutro Abs: 3452 cells/uL (ref 1500–7800)
Neutrophils Relative %: 54.8 %
Platelets: 190 10*3/uL (ref 140–400)
RBC: 2.78 10*6/uL — ABNORMAL LOW (ref 4.20–5.80)
RDW: 14.6 % (ref 11.0–15.0)
Total Lymphocyte: 31.1 %
WBC: 6.3 10*3/uL (ref 3.8–10.8)

## 2020-06-21 LAB — IRON,TIBC AND FERRITIN PANEL
%SAT: 30 % (calc) (ref 20–48)
Ferritin: 1162 ng/mL — ABNORMAL HIGH (ref 24–380)
Iron: 71 ug/dL (ref 50–180)
TIBC: 240 mcg/dL (calc) — ABNORMAL LOW (ref 250–425)

## 2020-06-21 MED ORDER — CLINDAMYCIN PHOSPHATE 1 % EX LOTN: TOPICAL_LOTION | CUTANEOUS | 1 refills | Status: DC

## 2020-06-21 NOTE — Progress Notes (Signed)
   Follow-Up Visit   Subjective  Jeremy Johnson is a 74 y.o. male who presents for the following: Follow-up. Patient here for 2 week follow up. Biopsy proven Grovers Disease at the trunk. He is using TMC 0.1% cream which has helped with the itch.   The following portions of the chart were reviewed this encounter and updated as appropriate:  Tobacco  Allergies  Meds  Problems  Med Hx  Surg Hx  Fam Hx     Review of Systems:  No other skin or systemic complaints except as noted in HPI or Assessment and Plan.  Objective  Well appearing patient in no apparent distress; mood and affect are within normal limits.  A focused examination was performed including trunk. Relevant physical exam findings are noted in the Assessment and Plan.  Objective  Trunk: Pink rough 2-32mm papules on mid to low back, upper back and chest are clear   Assessment & Plan  Grover's disease -biopsy-proven -persistent but improving with treatment Trunk  Cont TMC 0.1% cream once daily Monday thru Friday as needed for rash/itch. Start clindamycin lotion once daily, 7 days a week as needed for itch/rash.   Ordered Medications: clindamycin (CLEOCIN-T) 1 % lotion  Return in about 6 weeks (around 08/02/2020) for Grovers.  Graciella Belton, RMA, am acting as scribe for Sarina Ser, MD . Documentation: I have reviewed the above documentation for accuracy and completeness, and I agree with the above.  Sarina Ser, MD

## 2020-06-21 NOTE — Patient Instructions (Addendum)
Topical steroids (such as triamcinolone, fluocinolone, fluocinonide, mometasone, clobetasol, halobetasol, betamethasone, hydrocortisone) can cause thinning and lightening of the skin if they are used for too long in the same area. Your physician has selected the right strength medicine for your problem and area affected on the body. Please use your medication only as directed by your physician to prevent side effects.   Recommend daily broad spectrum sunscreen SPF 30+ to sun-exposed areas, reapply every 2 hours as needed. Call for new or changing lesions.  Continue TMC 0.1% cream once daily Monday thru Friday as needed for rash/itch. Start clindamycin lotion once daily, 7 days a week as needed for itch/rash.

## 2020-06-21 NOTE — Telephone Encounter (Signed)
[facility] called earlier asking for additional diagnosis codes on patient.

## 2020-06-21 NOTE — Telephone Encounter (Signed)
Called pt. TCS/EGD with propofol with Dr. Abbey Chatters scheduled for 9/14 at 11:15am. Patient aware will need pre-op/covid test prior. Advised will mail prep instructions with this appt. Confirmed mailing address is correct.

## 2020-06-22 ENCOUNTER — Telehealth: Payer: Self-pay | Admitting: *Deleted

## 2020-06-22 ENCOUNTER — Encounter: Payer: Self-pay | Admitting: *Deleted

## 2020-06-22 DIAGNOSIS — N186 End stage renal disease: Secondary | ICD-10-CM | POA: Diagnosis not present

## 2020-06-22 DIAGNOSIS — N2581 Secondary hyperparathyroidism of renal origin: Secondary | ICD-10-CM | POA: Diagnosis not present

## 2020-06-22 DIAGNOSIS — Z992 Dependence on renal dialysis: Secondary | ICD-10-CM | POA: Diagnosis not present

## 2020-06-22 DIAGNOSIS — D689 Coagulation defect, unspecified: Secondary | ICD-10-CM | POA: Diagnosis not present

## 2020-06-22 NOTE — Telephone Encounter (Signed)
PA approved via [facility] website through [facility] for TCS/EGD. Auth# [id number] dates 07/27/2020-10/25/2020  [facility] website for his [employer] and received message "blocking-unable to create request". Called [employer] at [phone] and PA is REQUIRED. PA done via telephone and is approved. Auth# [id number] dates 07/27/2020-10/24/2020

## 2020-06-22 NOTE — Telephone Encounter (Signed)
Called and gave hx of cancer code and Z79.899. if those codes don't work, they were asked to call back.

## 2020-06-22 NOTE — Telephone Encounter (Signed)
Called [facility] 06/21/20 and wasn't able to reach anyone. Will reach back out to them.

## 2020-06-23 NOTE — Progress Notes (Signed)
Hgb is 9.2, improved from June 2021 when it was in the 8 range. He is close to his baseline. Iron 71, ferritin elevated at 1162 although less than prior. ?reactant. Iron sats 30. Awaiting TCS/EGD. Will need CT abd/pelvis if EGD/colonoscopy negative.

## 2020-06-24 ENCOUNTER — Encounter: Payer: Self-pay | Admitting: Dermatology

## 2020-06-24 DIAGNOSIS — D689 Coagulation defect, unspecified: Secondary | ICD-10-CM | POA: Diagnosis not present

## 2020-06-24 DIAGNOSIS — N186 End stage renal disease: Secondary | ICD-10-CM | POA: Diagnosis not present

## 2020-06-24 DIAGNOSIS — Z992 Dependence on renal dialysis: Secondary | ICD-10-CM | POA: Diagnosis not present

## 2020-06-24 DIAGNOSIS — N2581 Secondary hyperparathyroidism of renal origin: Secondary | ICD-10-CM | POA: Diagnosis not present

## 2020-06-26 DIAGNOSIS — Z992 Dependence on renal dialysis: Secondary | ICD-10-CM | POA: Diagnosis not present

## 2020-06-26 DIAGNOSIS — N2581 Secondary hyperparathyroidism of renal origin: Secondary | ICD-10-CM | POA: Diagnosis not present

## 2020-06-26 DIAGNOSIS — D689 Coagulation defect, unspecified: Secondary | ICD-10-CM | POA: Diagnosis not present

## 2020-06-26 DIAGNOSIS — N186 End stage renal disease: Secondary | ICD-10-CM | POA: Diagnosis not present

## 2020-06-29 DIAGNOSIS — Z992 Dependence on renal dialysis: Secondary | ICD-10-CM | POA: Diagnosis not present

## 2020-06-29 DIAGNOSIS — N2581 Secondary hyperparathyroidism of renal origin: Secondary | ICD-10-CM | POA: Diagnosis not present

## 2020-06-29 DIAGNOSIS — D689 Coagulation defect, unspecified: Secondary | ICD-10-CM | POA: Diagnosis not present

## 2020-06-29 DIAGNOSIS — N186 End stage renal disease: Secondary | ICD-10-CM | POA: Diagnosis not present

## 2020-07-01 DIAGNOSIS — N186 End stage renal disease: Secondary | ICD-10-CM | POA: Diagnosis not present

## 2020-07-01 DIAGNOSIS — D689 Coagulation defect, unspecified: Secondary | ICD-10-CM | POA: Diagnosis not present

## 2020-07-01 DIAGNOSIS — N2581 Secondary hyperparathyroidism of renal origin: Secondary | ICD-10-CM | POA: Diagnosis not present

## 2020-07-01 DIAGNOSIS — Z992 Dependence on renal dialysis: Secondary | ICD-10-CM | POA: Diagnosis not present

## 2020-07-02 DIAGNOSIS — J449 Chronic obstructive pulmonary disease, unspecified: Secondary | ICD-10-CM | POA: Diagnosis not present

## 2020-07-03 DIAGNOSIS — N186 End stage renal disease: Secondary | ICD-10-CM | POA: Diagnosis not present

## 2020-07-03 DIAGNOSIS — D689 Coagulation defect, unspecified: Secondary | ICD-10-CM | POA: Diagnosis not present

## 2020-07-03 DIAGNOSIS — N2581 Secondary hyperparathyroidism of renal origin: Secondary | ICD-10-CM | POA: Diagnosis not present

## 2020-07-03 DIAGNOSIS — Z992 Dependence on renal dialysis: Secondary | ICD-10-CM | POA: Diagnosis not present

## 2020-07-06 DIAGNOSIS — Z992 Dependence on renal dialysis: Secondary | ICD-10-CM | POA: Diagnosis not present

## 2020-07-06 DIAGNOSIS — N2581 Secondary hyperparathyroidism of renal origin: Secondary | ICD-10-CM | POA: Diagnosis not present

## 2020-07-06 DIAGNOSIS — N186 End stage renal disease: Secondary | ICD-10-CM | POA: Diagnosis not present

## 2020-07-06 DIAGNOSIS — D689 Coagulation defect, unspecified: Secondary | ICD-10-CM | POA: Diagnosis not present

## 2020-07-07 DIAGNOSIS — J449 Chronic obstructive pulmonary disease, unspecified: Secondary | ICD-10-CM | POA: Diagnosis not present

## 2020-07-07 DIAGNOSIS — C3490 Malignant neoplasm of unspecified part of unspecified bronchus or lung: Secondary | ICD-10-CM | POA: Diagnosis not present

## 2020-07-08 ENCOUNTER — Encounter (HOSPITAL_COMMUNITY): Payer: Self-pay | Admitting: Emergency Medicine

## 2020-07-08 ENCOUNTER — Other Ambulatory Visit: Payer: Self-pay

## 2020-07-08 ENCOUNTER — Emergency Department (HOSPITAL_COMMUNITY)
Admission: EM | Admit: 2020-07-08 | Discharge: 2020-07-09 | Disposition: A | Discharge status: Home / Self Care | Payer: Medicare Other | Attending: Emergency Medicine | Admitting: Emergency Medicine

## 2020-07-08 ENCOUNTER — Emergency Department (HOSPITAL_COMMUNITY): Payer: Medicare Other

## 2020-07-08 DIAGNOSIS — N186 End stage renal disease: Secondary | ICD-10-CM | POA: Diagnosis not present

## 2020-07-08 DIAGNOSIS — R079 Chest pain, unspecified: Secondary | ICD-10-CM | POA: Diagnosis not present

## 2020-07-08 DIAGNOSIS — Z5321 Procedure and treatment not carried out due to patient leaving prior to being seen by health care provider: Secondary | ICD-10-CM | POA: Insufficient documentation

## 2020-07-08 DIAGNOSIS — I499 Cardiac arrhythmia, unspecified: Secondary | ICD-10-CM | POA: Diagnosis not present

## 2020-07-08 DIAGNOSIS — D689 Coagulation defect, unspecified: Secondary | ICD-10-CM | POA: Diagnosis not present

## 2020-07-08 DIAGNOSIS — Z992 Dependence on renal dialysis: Secondary | ICD-10-CM | POA: Diagnosis not present

## 2020-07-08 DIAGNOSIS — R0902 Hypoxemia: Secondary | ICD-10-CM | POA: Diagnosis not present

## 2020-07-08 DIAGNOSIS — J90 Pleural effusion, not elsewhere classified: Secondary | ICD-10-CM | POA: Diagnosis not present

## 2020-07-08 DIAGNOSIS — Z743 Need for continuous supervision: Secondary | ICD-10-CM | POA: Diagnosis not present

## 2020-07-08 DIAGNOSIS — N2581 Secondary hyperparathyroidism of renal origin: Secondary | ICD-10-CM | POA: Diagnosis not present

## 2020-07-08 DIAGNOSIS — R0789 Other chest pain: Secondary | ICD-10-CM | POA: Diagnosis not present

## 2020-07-08 LAB — CBC
HCT: 29.1 % — ABNORMAL LOW (ref 39.0–52.0)
Hemoglobin: 9.4 g/dL — ABNORMAL LOW (ref 13.0–17.0)
MCH: 33.1 pg (ref 26.0–34.0)
MCHC: 32.3 g/dL (ref 30.0–36.0)
MCV: 102.5 fL — ABNORMAL HIGH (ref 80.0–100.0)
Platelets: 195 10*3/uL (ref 150–400)
RBC: 2.84 MIL/uL — ABNORMAL LOW (ref 4.22–5.81)
RDW: 15.6 % — ABNORMAL HIGH (ref 11.5–15.5)
WBC: 11.4 10*3/uL — ABNORMAL HIGH (ref 4.0–10.5)
nRBC: 0 % (ref 0.0–0.2)

## 2020-07-08 LAB — BASIC METABOLIC PANEL
Anion gap: 13 (ref 5–15)
BUN: 17 mg/dL (ref 8–23)
CO2: 29 mmol/L (ref 22–32)
Calcium: 9.3 mg/dL (ref 8.9–10.3)
Chloride: 97 mmol/L — ABNORMAL LOW (ref 98–111)
Creatinine, Ser: 4.36 mg/dL — ABNORMAL HIGH (ref 0.61–1.24)
GFR calc Af Amer: 15 mL/min — ABNORMAL LOW (ref >60)
GFR calc non Af Amer: 13 mL/min — ABNORMAL LOW (ref >60)
Glucose, Bld: 130 mg/dL — ABNORMAL HIGH (ref 70–99)
Potassium: 4 mmol/L (ref 3.5–5.1)
Sodium: 139 mmol/L (ref 135–145)

## 2020-07-08 LAB — TROPONIN I (HIGH SENSITIVITY): Troponin I (High Sensitivity): 26 ng/L — ABNORMAL HIGH (ref <18)

## 2020-07-08 NOTE — ED Triage Notes (Signed)
Pt c/o central non-radiating chest pain that started today. Pt took aspirin at home, and SL NTG without change to pain. Hx MI with stents, does dialysis Tues/Thurs/Sat.

## 2020-07-09 LAB — TROPONIN I (HIGH SENSITIVITY): Troponin I (High Sensitivity): 24 ng/L — ABNORMAL HIGH (ref <18)

## 2020-07-09 NOTE — ED Notes (Signed)
Pt LWBS. Pt stated he was feeling better and wanted to go home.

## 2020-07-10 DIAGNOSIS — N2581 Secondary hyperparathyroidism of renal origin: Secondary | ICD-10-CM | POA: Diagnosis not present

## 2020-07-10 DIAGNOSIS — D689 Coagulation defect, unspecified: Secondary | ICD-10-CM | POA: Diagnosis not present

## 2020-07-10 DIAGNOSIS — N186 End stage renal disease: Secondary | ICD-10-CM | POA: Diagnosis not present

## 2020-07-10 DIAGNOSIS — Z992 Dependence on renal dialysis: Secondary | ICD-10-CM | POA: Diagnosis not present

## 2020-07-13 DIAGNOSIS — I129 Hypertensive chronic kidney disease with stage 1 through stage 4 chronic kidney disease, or unspecified chronic kidney disease: Secondary | ICD-10-CM | POA: Diagnosis not present

## 2020-07-13 DIAGNOSIS — N2581 Secondary hyperparathyroidism of renal origin: Secondary | ICD-10-CM | POA: Diagnosis not present

## 2020-07-13 DIAGNOSIS — D689 Coagulation defect, unspecified: Secondary | ICD-10-CM | POA: Diagnosis not present

## 2020-07-13 DIAGNOSIS — N186 End stage renal disease: Secondary | ICD-10-CM | POA: Diagnosis not present

## 2020-07-13 DIAGNOSIS — Z992 Dependence on renal dialysis: Secondary | ICD-10-CM | POA: Diagnosis not present

## 2020-07-15 DIAGNOSIS — D689 Coagulation defect, unspecified: Secondary | ICD-10-CM | POA: Diagnosis not present

## 2020-07-15 DIAGNOSIS — N186 End stage renal disease: Secondary | ICD-10-CM | POA: Diagnosis not present

## 2020-07-15 DIAGNOSIS — N2581 Secondary hyperparathyroidism of renal origin: Secondary | ICD-10-CM | POA: Diagnosis not present

## 2020-07-15 DIAGNOSIS — Z992 Dependence on renal dialysis: Secondary | ICD-10-CM | POA: Diagnosis not present

## 2020-07-17 DIAGNOSIS — D689 Coagulation defect, unspecified: Secondary | ICD-10-CM | POA: Diagnosis not present

## 2020-07-17 DIAGNOSIS — N2581 Secondary hyperparathyroidism of renal origin: Secondary | ICD-10-CM | POA: Diagnosis not present

## 2020-07-17 DIAGNOSIS — Z992 Dependence on renal dialysis: Secondary | ICD-10-CM | POA: Diagnosis not present

## 2020-07-17 DIAGNOSIS — N186 End stage renal disease: Secondary | ICD-10-CM | POA: Diagnosis not present

## 2020-07-20 ENCOUNTER — Other Ambulatory Visit: Payer: Self-pay

## 2020-07-20 ENCOUNTER — Encounter (HOSPITAL_COMMUNITY): Payer: Self-pay

## 2020-07-20 ENCOUNTER — Ambulatory Visit (HOSPITAL_COMMUNITY)
Admission: RE | Admit: 2020-07-20 | Discharge: 2020-07-20 | Disposition: A | Discharge status: Home / Self Care | Payer: Medicare Other | Source: Ambulatory Visit | Attending: Internal Medicine | Admitting: Internal Medicine

## 2020-07-20 ENCOUNTER — Inpatient Hospital Stay: Payer: Medicare Other | Attending: Internal Medicine

## 2020-07-20 DIAGNOSIS — Z8719 Personal history of other diseases of the digestive system: Secondary | ICD-10-CM | POA: Insufficient documentation

## 2020-07-20 DIAGNOSIS — C3411 Malignant neoplasm of upper lobe, right bronchus or lung: Secondary | ICD-10-CM | POA: Diagnosis not present

## 2020-07-20 DIAGNOSIS — Z923 Personal history of irradiation: Secondary | ICD-10-CM | POA: Insufficient documentation

## 2020-07-20 DIAGNOSIS — N186 End stage renal disease: Secondary | ICD-10-CM | POA: Insufficient documentation

## 2020-07-20 DIAGNOSIS — Z992 Dependence on renal dialysis: Secondary | ICD-10-CM | POA: Insufficient documentation

## 2020-07-20 DIAGNOSIS — I700 Atherosclerosis of aorta: Secondary | ICD-10-CM | POA: Insufficient documentation

## 2020-07-20 DIAGNOSIS — D689 Coagulation defect, unspecified: Secondary | ICD-10-CM | POA: Diagnosis not present

## 2020-07-20 DIAGNOSIS — Z880 Allergy status to penicillin: Secondary | ICD-10-CM | POA: Diagnosis not present

## 2020-07-20 DIAGNOSIS — I2510 Atherosclerotic heart disease of native coronary artery without angina pectoris: Secondary | ICD-10-CM | POA: Insufficient documentation

## 2020-07-20 DIAGNOSIS — Z86010 Personal history of colonic polyps: Secondary | ICD-10-CM | POA: Insufficient documentation

## 2020-07-20 DIAGNOSIS — C3490 Malignant neoplasm of unspecified part of unspecified bronchus or lung: Secondary | ICD-10-CM

## 2020-07-20 DIAGNOSIS — I252 Old myocardial infarction: Secondary | ICD-10-CM | POA: Diagnosis not present

## 2020-07-20 DIAGNOSIS — I712 Thoracic aortic aneurysm, without rupture: Secondary | ICD-10-CM | POA: Diagnosis not present

## 2020-07-20 DIAGNOSIS — E039 Hypothyroidism, unspecified: Secondary | ICD-10-CM | POA: Insufficient documentation

## 2020-07-20 DIAGNOSIS — K439 Ventral hernia without obstruction or gangrene: Secondary | ICD-10-CM | POA: Diagnosis not present

## 2020-07-20 DIAGNOSIS — J439 Emphysema, unspecified: Secondary | ICD-10-CM | POA: Insufficient documentation

## 2020-07-20 DIAGNOSIS — Z9221 Personal history of antineoplastic chemotherapy: Secondary | ICD-10-CM | POA: Insufficient documentation

## 2020-07-20 DIAGNOSIS — Z79899 Other long term (current) drug therapy: Secondary | ICD-10-CM | POA: Diagnosis not present

## 2020-07-20 DIAGNOSIS — N2581 Secondary hyperparathyroidism of renal origin: Secondary | ICD-10-CM | POA: Diagnosis not present

## 2020-07-20 DIAGNOSIS — R5383 Other fatigue: Secondary | ICD-10-CM | POA: Insufficient documentation

## 2020-07-20 DIAGNOSIS — R0609 Other forms of dyspnea: Secondary | ICD-10-CM | POA: Diagnosis not present

## 2020-07-20 DIAGNOSIS — J90 Pleural effusion, not elsewhere classified: Secondary | ICD-10-CM | POA: Diagnosis not present

## 2020-07-20 LAB — CBC WITH DIFFERENTIAL (CANCER CENTER ONLY)
Abs Immature Granulocytes: 0.03 10*3/uL (ref 0.00–0.07)
Basophils Absolute: 0.1 10*3/uL (ref 0.0–0.1)
Basophils Relative: 1 %
Eosinophils Absolute: 0.2 10*3/uL (ref 0.0–0.5)
Eosinophils Relative: 3 %
HCT: 28.1 % — ABNORMAL LOW (ref 39.0–52.0)
Hemoglobin: 9.3 g/dL — ABNORMAL LOW (ref 13.0–17.0)
Immature Granulocytes: 0 %
Lymphocytes Relative: 24 %
Lymphs Abs: 1.7 10*3/uL (ref 0.7–4.0)
MCH: 32.6 pg (ref 26.0–34.0)
MCHC: 33.1 g/dL (ref 30.0–36.0)
MCV: 98.6 fL (ref 80.0–100.0)
Monocytes Absolute: 0.5 10*3/uL (ref 0.1–1.0)
Monocytes Relative: 7 %
Neutro Abs: 4.5 10*3/uL (ref 1.7–7.7)
Neutrophils Relative %: 65 %
Platelet Count: 193 10*3/uL (ref 150–400)
RBC: 2.85 MIL/uL — ABNORMAL LOW (ref 4.22–5.81)
RDW: 15.1 % (ref 11.5–15.5)
WBC Count: 7 10*3/uL (ref 4.0–10.5)
nRBC: 0 % (ref 0.0–0.2)

## 2020-07-20 LAB — CMP (CANCER CENTER ONLY)
ALT: 35 U/L (ref 0–44)
AST: 39 U/L (ref 15–41)
Albumin: 3.2 g/dL — ABNORMAL LOW (ref 3.5–5.0)
Alkaline Phosphatase: 98 U/L (ref 38–126)
Anion gap: 9 (ref 5–15)
BUN: 11 mg/dL (ref 8–23)
CO2: 32 mmol/L (ref 22–32)
Calcium: 9.2 mg/dL (ref 8.9–10.3)
Chloride: 97 mmol/L — ABNORMAL LOW (ref 98–111)
Creatinine: 3.69 mg/dL (ref 0.61–1.24)
GFR, Est AFR Am: 18 mL/min — ABNORMAL LOW (ref >60)
GFR, Estimated: 15 mL/min — ABNORMAL LOW (ref >60)
Glucose, Bld: 95 mg/dL (ref 70–99)
Potassium: 4.6 mmol/L (ref 3.5–5.1)
Sodium: 138 mmol/L (ref 135–145)
Total Bilirubin: 0.4 mg/dL (ref 0.3–1.2)
Total Protein: 8 g/dL (ref 6.5–8.1)

## 2020-07-21 ENCOUNTER — Inpatient Hospital Stay (HOSPITAL_BASED_OUTPATIENT_CLINIC_OR_DEPARTMENT_OTHER): Payer: Medicare Other | Admitting: Internal Medicine

## 2020-07-21 ENCOUNTER — Encounter: Payer: Self-pay | Admitting: Internal Medicine

## 2020-07-21 ENCOUNTER — Other Ambulatory Visit: Payer: Self-pay

## 2020-07-21 VITALS — BP 133/66 | HR 79 | Temp 97.8°F | Resp 18 | Ht 69.0 in | Wt 151.9 lb

## 2020-07-21 DIAGNOSIS — I700 Atherosclerosis of aorta: Secondary | ICD-10-CM | POA: Diagnosis not present

## 2020-07-21 DIAGNOSIS — I252 Old myocardial infarction: Secondary | ICD-10-CM | POA: Diagnosis not present

## 2020-07-21 DIAGNOSIS — I10 Essential (primary) hypertension: Secondary | ICD-10-CM

## 2020-07-21 DIAGNOSIS — Z79899 Other long term (current) drug therapy: Secondary | ICD-10-CM | POA: Diagnosis not present

## 2020-07-21 DIAGNOSIS — Z9221 Personal history of antineoplastic chemotherapy: Secondary | ICD-10-CM | POA: Diagnosis not present

## 2020-07-21 DIAGNOSIS — K439 Ventral hernia without obstruction or gangrene: Secondary | ICD-10-CM | POA: Diagnosis not present

## 2020-07-21 DIAGNOSIS — C3490 Malignant neoplasm of unspecified part of unspecified bronchus or lung: Secondary | ICD-10-CM

## 2020-07-21 DIAGNOSIS — Z880 Allergy status to penicillin: Secondary | ICD-10-CM | POA: Diagnosis not present

## 2020-07-21 DIAGNOSIS — Z86010 Personal history of colonic polyps: Secondary | ICD-10-CM | POA: Diagnosis not present

## 2020-07-21 DIAGNOSIS — C3411 Malignant neoplasm of upper lobe, right bronchus or lung: Secondary | ICD-10-CM | POA: Diagnosis not present

## 2020-07-21 DIAGNOSIS — E039 Hypothyroidism, unspecified: Secondary | ICD-10-CM | POA: Diagnosis not present

## 2020-07-21 DIAGNOSIS — Z992 Dependence on renal dialysis: Secondary | ICD-10-CM | POA: Diagnosis not present

## 2020-07-21 DIAGNOSIS — I2510 Atherosclerotic heart disease of native coronary artery without angina pectoris: Secondary | ICD-10-CM | POA: Diagnosis not present

## 2020-07-21 DIAGNOSIS — N186 End stage renal disease: Secondary | ICD-10-CM | POA: Diagnosis not present

## 2020-07-21 DIAGNOSIS — Z923 Personal history of irradiation: Secondary | ICD-10-CM | POA: Diagnosis not present

## 2020-07-21 DIAGNOSIS — R0609 Other forms of dyspnea: Secondary | ICD-10-CM | POA: Diagnosis not present

## 2020-07-21 DIAGNOSIS — R5383 Other fatigue: Secondary | ICD-10-CM | POA: Diagnosis not present

## 2020-07-21 DIAGNOSIS — J439 Emphysema, unspecified: Secondary | ICD-10-CM | POA: Diagnosis not present

## 2020-07-21 DIAGNOSIS — Z8719 Personal history of other diseases of the digestive system: Secondary | ICD-10-CM | POA: Diagnosis not present

## 2020-07-21 NOTE — Progress Notes (Signed)
[facility] Telephone:([phone]) [phone]   Fax:([phone]) [phone]  OFFICE PROGRESS NOTE  [name], MD [address] [place] [address]  DIAGNOSIS: Limited stage (T2b, N1, M0) small cell lung cancer presented with right upper lobe with invasion of superior segment of right lower lobe as well as right hilar lymphadenopathy diagnosed in March 2020.  PRIOR THERAPY:  Systemic chemotherapy with carboplatin for AUC of 5 on day 1 and etoposide 100 mg/M2 on days 1, 2 and 3 every 3 weeks.  Status post 4 cycles.  CURRENT THERAPY: Observation.  INTERVAL HISTORY: Jeremy Johnson 74 y.o. male returns to the clinic today for follow-up visit.  His wife was available by phone during the visit.  The patient has a fall after dialysis 2 months ago when he broke his right hip.  He denied having any current chest pain, shortness of breath, cough or hemoptysis.  He denied having any fever or chills.  He has no nausea, vomiting, diarrhea or constipation.  He lost several pounds recently and he is scheduled to see gastroenterology for upper endoscopy and colonoscopy.  The patient had repeat CT scan of the chest performed recently and he is here for evaluation and discussion of his scan results.   MEDICAL HISTORY: Past Medical History:  Diagnosis Date  . Anemia   . Blood transfusion without reported diagnosis   . CAD (coronary artery disease)    STENT... MID CIRCUMFLEX...1997  . Chronic kidney disease    STAGE 3  . COPD (chronic obstructive pulmonary disease) ([place])   . Degenerative joint disease (DJD) of lumbar spine   . GERD (gastroesophageal reflux disease)   . Gout   . Hyperlipidemia   . Hypertension   . Hypothyroidism   . Incisional hernia    abdomen  . Leukocytosis    CHRONIC MILD  . Myocardial infarction ([place])    1997  . SCL CA dx'd 01/2019   Lung cancer    ALLERGIES:  is allergic to advair hfa [fluticasone-salmeterol] and penicillins.  MEDICATIONS:  Current  Outpatient Medications  Medication Sig Dispense Refill  . acetaminophen (TYLENOL) 325 MG tablet Take 2 tablets (650 mg total) by mouth every 6 (six) hours as needed for mild pain (or Fever >/= 101). (Patient taking differently: Take 1,300 mg by mouth every 6 (six) hours as needed for mild pain or headache (or Fever >/= 101). Arthritis strength) 30 tablet 0  . albuterol (VENTOLIN HFA) 108 (90 Base) MCG/ACT inhaler Inhale 2 puffs into the lungs every 6 (six) hours as needed for wheezing or shortness of breath.     . allopurinol (ZYLOPRIM) 100 MG tablet Take 1 tablet (100 mg total) by mouth daily. (Patient taking differently: Take 100 mg by mouth in the morning. ) 30 tablet 1  . amLODipine (NORVASC) 10 MG tablet Take 10 mg by mouth at bedtime.     [name] aspirin EC 81 MG tablet Take 81 mg by mouth at bedtime.    . B Complex-C-Zn-Folic Acid (DIALYVITE [id number]) 0.8 MG TABS Take 1 tablet by mouth daily.    . clindamycin (CLEOCIN-T) 1 % lotion Apply to affected areas once daily 7 days a week as needed for rash/itch. (Patient taking differently: Apply 1 application topically daily as needed (Rash/ itching). ) 60 mL 1  . Darbepoetin Alfa (ARANESP) 100 MCG/0.5ML SOSY injection Inject 0.5 mLs (100 mcg total) into the vein every Thursday with hemodialysis. 4.2 mL   . iron sucrose  in sodium chloride 0.9 % 100 mL 50 mg.    . levothyroxine (SYNTHROID, LEVOTHROID) 175 MCG tablet Take 175 mcg by mouth daily before breakfast.     . Methoxy PEG-Epoetin Beta (MIRCERA IJ) Inject 30 mg into the vein every 28 (twenty-eight) days.     . metoprolol tartrate (LOPRESSOR) 50 MG tablet Take 25 mg by mouth 2 (two) times daily.     [name] omeprazole (PRILOSEC) 40 MG capsule Take 40 mg by mouth in the morning.     . pramipexole (MIRAPEX) 0.25 MG tablet Take 0.25 mg by mouth at bedtime.    . rosuvastatin (CRESTOR) 10 MG tablet Take 10 mg by mouth at bedtime.    . triamcinolone cream (KENALOG) 0.1 % Apply to aa's rash BID until clear then  PRN thereafter. Avoid face, groin, and axilla. (Patient taking differently: Apply 1 application topically See admin instructions. Apply to aa's rash BID until clear then PRN thereafter. Avoid face, groin, and axilla.) 453.6 g 1   No current facility-administered medications for this visit.    SURGICAL HISTORY:  Past Surgical History:  Procedure Laterality Date  . ABDOMINAL AORTIC ANEURYSM REPAIR  2006  . AV FISTULA PLACEMENT Left 04/25/2019   Procedure: ARTERIOVENOUS (AV) FISTULA CREATION LEFT ARM;  Surgeon: Angelia Mould, MD;  Location: [facility];  Service: Vascular;  Laterality: Left;  . AV FISTULA PLACEMENT Left 05/19/2019   Procedure: CONVERSION OF LEFT ARM ARTERIOVENOUS FISTULA TO GRAFT;  Surgeon: Angelia Mould, MD;  Location: [facility];  Service: Vascular;  Laterality: Left;  . BIOPSY  09/30/2018   Procedure: BIOPSY;  Surgeon: Danie Binder, MD;  [facility];  Service: Endoscopy;;  ascending colon  . COLONOSCOPY  2008  . COLONOSCOPY N/A 09/30/2018   External and internal hemorrhoids, six polyps removed, one ascending colon polypoid lesion biopsied. Six simple adenomas and one benign polypoid lesion. Colonoscopy Nov 2022.   [name] [place]  . HIP ARTHROPLASTY Right 04/30/2020   Procedure: ARTHROPLASTY  HIP (HEMIARTHROPLASTY);  Surgeon: Altamese [place], MD;  Location: [facility];  Service: Orthopedics;  Laterality: Right;  . IR FLUORO GUIDE CV LINE RIGHT  04/22/2019  . IR FLUORO GUIDE CV LINE RIGHT  05/01/2020  . IR THROMBECTOMY AV FISTULA W/THROMBOLYSIS/PTA INC/SHUNT/IMG LEFT Left 05/03/2020  . IR US GUIDE VASC ACCESS LEFT  05/03/2020  . IR US GUIDE VASC ACCESS RIGHT  04/22/2019  . IR US GUIDE VASC ACCESS RIGHT  05/01/2020  . POLYPECTOMY  09/30/2018   Procedure: POLYPECTOMY;  Surgeon: Danie Binder, MD;  [facility];  Service: Endoscopy;;  colon  . VIDEO BRONCHOSCOPY WITH ENDOBRONCHIAL NAVIGATION N/A 01/27/2019    Procedure: VIDEO BRONCHOSCOPY WITH ENDOBRONCHIAL NAVIGATION;  Surgeon: Grace Isaac, MD;  Location: [facility];  Service: Thoracic;  Laterality: N/A;  . VIDEO BRONCHOSCOPY WITH ENDOBRONCHIAL ULTRASOUND N/A 01/27/2019   Procedure: VIDEO BRONCHOSCOPY WITH ENDOBRONCHIAL ULTRASOUND;  Surgeon: Grace Isaac, MD;  Location: [facility];  Service: Thoracic;  Laterality: N/A;    REVIEW OF SYSTEMS:  A comprehensive review of systems was negative except for: Constitutional: positive for fatigue and weight loss Respiratory: positive for dyspnea on exertion   PHYSICAL EXAMINATION: General appearance: alert, cooperative and no distress Head: Normocephalic, without obvious abnormality, atraumatic Neck: no adenopathy, no JVD, supple, symmetrical, trachea midline and thyroid not enlarged, symmetric, no tenderness/mass/nodules Lymph nodes: Cervical, supraclavicular, and axillary nodes normal. Resp: clear to auscultation bilaterally Back:  symmetric, no curvature. ROM normal. No CVA tenderness. Cardio: regular rate and rhythm, S1, S2 normal, no murmur, click, rub or gallop GI: soft, non-tender; bowel sounds normal; no masses,  no organomegaly Extremities: extremities normal, atraumatic, no cyanosis or edema  ECOG PERFORMANCE STATUS: 1 - Symptomatic but completely ambulatory  Blood pressure 133/66, pulse 79, temperature 97.8 F (36.6 C), temperature source Tympanic, resp. rate 18, height 5\' 9"  (1.753 m), weight 151 lb 14.4 oz (68.9 kg), SpO2 96 %.  LABORATORY DATA: Lab Results  Component Value Date   WBC 7.0 07/20/2020   HGB 9.3 (L) 07/20/2020   HCT 28.1 (L) 07/20/2020   MCV 98.6 07/20/2020   PLT 193 07/20/2020      Chemistry      Component Value Date/Time   NA 138 07/20/2020 1058   K 4.6 07/20/2020 1058   CL 97 (L) 07/20/2020 1058   CO2 32 07/20/2020 1058   BUN 11 07/20/2020 1058   CREATININE 3.69 (HH) 07/20/2020 1058      Component Value Date/Time   CALCIUM 9.2 07/20/2020 1058   ALKPHOS  98 07/20/2020 1058   AST 39 07/20/2020 1058   ALT 35 07/20/2020 1058   BILITOT 0.4 07/20/2020 1058       RADIOGRAPHIC STUDIES: DG Chest 2 View  Result Date: 07/08/2020 CLINICAL DATA:  Chest pain radiating to the left shoulder EXAM: CHEST - 2 VIEW COMPARISON:  04/29/2020 FINDINGS: Cardiac shadow is enlarged. Aortic calcifications are again seen. Post radiation changes are noted in the left upper lobe. Diffuse interstitial changes are noted bilaterally stable from the prior exam likely chronic in nature. Increasing left basilar opacity is noted in the retrocardiac region. Small effusions are seen bilaterally. IMPRESSION: Small effusions bilaterally with increased left retrocardiac infiltrate. Electronically Signed   By: Inez Catalina M.D.   On: 07/08/2020 22:49   CT Chest Wo Contrast  Result Date: 07/20/2020 CLINICAL DATA:  Small-cell lung cancer.  Restaging. EXAM: CT CHEST WITHOUT CONTRAST TECHNIQUE: Multidetector CT imaging of the chest was performed following the standard protocol without IV contrast. COMPARISON:  CTA chest 04/05/2020.  CT chest 01/16/2020. FINDINGS: Cardiovascular: Heart is enlarged. Coronary artery calcification is evident. Atherosclerotic calcification is noted in the wall of the thoracic aorta. Focal aneurysmal dilatation of the proximal transverse aorta is stable, measuring 4.6 cm diameter. Mediastinum/Nodes: No mediastinal lymphadenopathy. No definite lymphadenopathy in the right hilum on this noncontrast study. Post treatment scarring noted in the left hilar region. The esophagus has normal imaging features. There is no axillary lymphadenopathy. Lungs/Pleura: Centrilobular emphsyema noted. Left Potter hilar volume loss/scarring is stable, likely related to radiation fibrosis. 6 mm right upper lobe nodule measured previously has decreased in the interval, measuring 4 mm today (38/7). Stable 7 mm subpleural nodule posterior right upper lobe (43/7), likely scar. Linear opacity  posterior left lower lobe measured previously at 1.5 x 0.6 cm is now 1.2 x 0.4 cm 9 mm posterior right lower lobe nodule measured previously has resolved in the interval. 7 mm posterior right middle lobe nodule measured previously has resolved in the interval. No new suspicious pulmonary nodule or mass. Tiny right pleural effusion has decreased since 04/05/2020. Similar appearance small left pleural effusion. Upper Abdomen: Midline ventral hernia contains bowel loops, potentially:, But has been incompletely visualized. Upper normal para-aortic lymph nodes are evident. Musculoskeletal: No worrisome lytic or sclerotic osseous abnormality. IMPRESSION: 1. Interval resolution of the right middle and lower lobe nodules with stable to slight decrease in size of other  previously identified index nodules. No new suspicious or progressive pulmonary nodule or mass. 2. Stable appearance of post treatment scarring in the left hilar region. 3. Bilateral pleural effusions, decreased since 04/05/2020. 4. Stable 4.6 cm proximal transverse thoracic aortic aneurysm. Recommend semi-annual imaging followup by CTA or MRA and referral to cardiothoracic surgery if not already obtained. This recommendation follows 2010 ACCF/AHA/AATS/ACR/ASA/SCA/SCAI/SIR/STS/SVM Guidelines for the Diagnosis and Management of Patients With Thoracic Aortic Disease. Circulation. 2010; 121: Y174-B44. Aortic aneurysm NOS (ICD10-I71.9) 5. Aortic Atherosclerosis (ICD10-I70.0) and Emphysema (ICD10-J43.9). Electronically Signed   By: Misty Stanley M.D.   On: 07/20/2020 14:19    ASSESSMENT AND PLAN: This is a very pleasant 74 years old white male with limited stage small cell lung cancer and currently undergoing systemic chemotherapy with carboplatin and etoposide status post 4 cycles. The patient tolerated his treatment well except for the pancytopenia that was complicated by his end-stage renal disease. The patient is currently on observation and he is feeling  fine today with no concerning complaints. He had repeat CT scan of the chest performed recently.  I personally and independently reviewed the scans and discussed the results with the patient and his wife today. His scan showed no concerning findings for disease progression of her metastasis. I recommended for the patient to continue on observation with repeat CT scan of the chest in 6 months. For the end-stage renal disease, he is currently on hemodialysis in [place] on Tuesday, Thursday and Saturdays. For the coronary atherosclerosis, I recommended for the patient to get a referral from his primary care physician to cardiology for further evaluation. The patient was advised to call immediately if he has any concerning symptoms in the interval. The patient voices understanding of current disease status and treatment options and is in agreement with the current care plan.  All questions were answered. The patient knows to call the clinic with any problems, questions or concerns. We can certainly see the patient much sooner if necessary.  Disclaimer: This note was dictated with voice recognition software. Similar sounding words can inadvertently be transcribed and may not be corrected upon review.

## 2020-07-21 NOTE — Patient Instructions (Signed)
FOTIOS AMOS  07/21/2020     @PREFPERIOPPHARMACY @   Your procedure is scheduled on  07/27/2020  Report to [facility] at  0900  A.M.  Call this number if you have problems the morning of surgery:  [phone]   Remember:  Follow the diet and prep instructions given to you by the office.                        Take these medicines the morning of surgery with A SIP OF WATER  Allopurinol, amlodipine, levothyroxine, metoprolol, prilosec. Use your inhaler before you come.    Do not wear jewelry, make-up or nail polish.  Do not wear lotions, powders, or perfumes. Please wear deodorant and brush your teeth.  Do not shave 48 hours prior to surgery.  Men may shave face and neck.  Do not bring valuables to the hospital.  [facility] is not responsible for any belongings or valuables.  Contacts, dentures or bridgework may not be worn into surgery.  Leave your suitcase in the car.  After surgery it may be brought to your room.  For patients admitted to the hospital, discharge time will be determined by your treatment team.  Patients discharged the day of surgery will not be allowed to drive home.   Name and phone number of your driver:   family Special instructions:   DO NOT smoke the morning of your procedure.  Please read over the following fact sheets that you were given. Anesthesia Post-op Instructions and Care and Recovery After Surgery       Upper Endoscopy, Adult, Care After This sheet gives you information about how to care for yourself after your procedure. Your health care provider may also give you more specific instructions. If you have problems or questions, contact your health care provider. What can I expect after the procedure? After the procedure, it is common to have:  A sore throat.  Mild stomach pain or discomfort.  Bloating.  Nausea. Follow these instructions at home:   Follow instructions from your health care provider about what to eat or  drink after your procedure.  Return to your normal activities as told by your health care provider. Ask your health care provider what activities are safe for you.  Take over-the-counter and prescription medicines only as told by your health care provider.  Do not drive for 24 hours if you were given a sedative during your procedure.  Keep all follow-up visits as told by your health care provider. This is important. Contact a health care provider if you have:  A sore throat that lasts longer than one day.  Trouble swallowing. Get help right away if:  You vomit blood or your vomit looks like coffee grounds.  You have: ? A fever. ? Bloody, black, or tarry stools. ? A severe sore throat or you cannot swallow. ? Difficulty breathing. ? Severe pain in your chest or abdomen. Summary  After the procedure, it is common to have a sore throat, mild stomach discomfort, bloating, and nausea.  Do not drive for 24 hours if you were given a sedative during the procedure.  Follow instructions from your health care provider about what to eat or drink after your procedure.  Return to your normal activities as told by your health care provider. This information is not intended to replace advice given to you by your health care provider. Make sure you  discuss any questions you have with your health care provider. Document Revised: 04/23/2018 Document Reviewed: 04/01/2018 Elsevier Patient Education  [place].  Colonoscopy, Adult, Care After This sheet gives you information about how to care for yourself after your procedure. Your health care provider may also give you more specific instructions. If you have problems or questions, contact your health care provider. What can I expect after the procedure? After the procedure, it is common to have:  A small amount of blood in your stool for 24 hours after the procedure.  Some gas.  Mild cramping or bloating of your abdomen. Follow  these instructions at home: Eating and drinking   Drink enough fluid to keep your urine pale yellow.  Follow instructions from your health care provider about eating or drinking restrictions.  Resume your normal diet as instructed by your health care provider. Avoid heavy or fried foods that are hard to digest. Activity  Rest as told by your health care provider.  Avoid sitting for a long time without moving. Get up to take short walks every 1-2 hours. This is important to improve blood flow and breathing. Ask for help if you feel weak or unsteady.  Return to your normal activities as told by your health care provider. Ask your health care provider what activities are safe for you. Managing cramping and bloating   Try walking around when you have cramps or feel bloated.  Apply heat to your abdomen as told by your health care provider. Use the heat source that your health care provider recommends, such as a moist heat pack or a heating pad. ? Place a towel between your skin and the heat source. ? Leave the heat on for 20-30 minutes. ? Remove the heat if your skin turns bright red. This is especially important if you are unable to feel pain, heat, or cold. You may have a greater risk of getting burned. General instructions  For the first 24 hours after the procedure: ? Do not drive or use machinery. ? Do not sign important documents. ? Do not drink alcohol. ? Do your regular daily activities at a slower pace than normal. ? Eat soft foods that are easy to digest.  Take over-the-counter and prescription medicines only as told by your health care provider.  Keep all follow-up visits as told by your health care provider. This is important. Contact a health care provider if:  You have blood in your stool 2-3 days after the procedure. Get help right away if you have:  More than a small spotting of blood in your stool.  Large blood clots in your stool.  Swelling of your  abdomen.  Nausea or vomiting.  A fever.  Increasing pain in your abdomen that is not relieved with medicine. Summary  After the procedure, it is common to have a small amount of blood in your stool. You may also have mild cramping and bloating of your abdomen.  For the first 24 hours after the procedure, do not drive or use machinery, sign important documents, or drink alcohol.  Get help right away if you have a lot of blood in your stool, nausea or vomiting, a fever, or increased pain in your abdomen. This information is not intended to replace advice given to you by your health care provider. Make sure you discuss any questions you have with your health care provider. Document Revised: 05/26/2019 Document Reviewed: 05/26/2019 Elsevier Patient Education  [place], Care  After These instructions provide you with information about caring for yourself after your procedure. Your health care provider may also give you more specific instructions. Your treatment has been planned according to current medical practices, but problems sometimes occur. Call your health care provider if you have any problems or questions after your procedure. What can I expect after the procedure? After your procedure, you may:  Feel sleepy for several hours.  Feel clumsy and have poor balance for several hours.  Feel forgetful about what happened after the procedure.  Have poor judgment for several hours.  Feel nauseous or vomit.  Have a sore throat if you had a breathing tube during the procedure. Follow these instructions at home: For at least 24 hours after the procedure:      Have a responsible adult stay with you. It is important to have someone help care for you until you are awake and alert.  Rest as needed.  Do not: ? Participate in activities in which you could fall or become injured. ? Drive. ? Use heavy machinery. ? Drink alcohol. ? Take sleeping  pills or medicines that cause drowsiness. ? Make important decisions or sign legal documents. ? Take care of children on your own. Eating and drinking  Follow the diet that is recommended by your health care provider.  If you vomit, drink water, juice, or soup when you can drink without vomiting.  Make sure you have little or no nausea before eating solid foods. General instructions  Take over-the-counter and prescription medicines only as told by your health care provider.  If you have sleep apnea, surgery and certain medicines can increase your risk for breathing problems. Follow instructions from your health care provider about wearing your sleep device: ? Anytime you are sleeping, including during daytime naps. ? While taking prescription pain medicines, sleeping medicines, or medicines that make you drowsy.  If you smoke, do not smoke without supervision.  Keep all follow-up visits as told by your health care provider. This is important. Contact a health care provider if:  You keep feeling nauseous or you keep vomiting.  You feel light-headed.  You develop a rash.  You have a fever. Get help right away if:  You have trouble breathing. Summary  For several hours after your procedure, you may feel sleepy and have poor judgment.  Have a responsible adult stay with you for at least 24 hours or until you are awake and alert. This information is not intended to replace advice given to you by your health care provider. Make sure you discuss any questions you have with your health care provider. Document Revised: 01/28/2018 Document Reviewed: 02/20/2016 Elsevier Patient Education  [place].

## 2020-07-22 DIAGNOSIS — N2581 Secondary hyperparathyroidism of renal origin: Secondary | ICD-10-CM | POA: Diagnosis not present

## 2020-07-22 DIAGNOSIS — D689 Coagulation defect, unspecified: Secondary | ICD-10-CM | POA: Diagnosis not present

## 2020-07-22 DIAGNOSIS — Z992 Dependence on renal dialysis: Secondary | ICD-10-CM | POA: Diagnosis not present

## 2020-07-22 DIAGNOSIS — N186 End stage renal disease: Secondary | ICD-10-CM | POA: Diagnosis not present

## 2020-07-23 ENCOUNTER — Encounter (HOSPITAL_COMMUNITY)
Admission: RE | Admit: 2020-07-23 | Discharge: 2020-07-23 | Disposition: A | Discharge status: Home / Self Care | Payer: Medicare Other | Source: Ambulatory Visit | Attending: Internal Medicine | Admitting: Internal Medicine

## 2020-07-23 ENCOUNTER — Telehealth: Payer: Self-pay | Admitting: Internal Medicine

## 2020-07-23 ENCOUNTER — Other Ambulatory Visit: Payer: Self-pay

## 2020-07-23 ENCOUNTER — Encounter (HOSPITAL_COMMUNITY): Payer: Self-pay

## 2020-07-23 ENCOUNTER — Other Ambulatory Visit (HOSPITAL_COMMUNITY)
Admission: RE | Admit: 2020-07-23 | Discharge: 2020-07-23 | Disposition: A | Discharge status: Home / Self Care | Payer: Medicare Other | Source: Ambulatory Visit | Attending: Internal Medicine | Admitting: Internal Medicine

## 2020-07-23 DIAGNOSIS — Z01812 Encounter for preprocedural laboratory examination: Secondary | ICD-10-CM | POA: Insufficient documentation

## 2020-07-23 DIAGNOSIS — Z20822 Contact with and (suspected) exposure to covid-19: Secondary | ICD-10-CM | POA: Insufficient documentation

## 2020-07-23 NOTE — Telephone Encounter (Signed)
Scheduled per los. Called and left msg. Mailed printout  °

## 2020-07-24 DIAGNOSIS — Z992 Dependence on renal dialysis: Secondary | ICD-10-CM | POA: Diagnosis not present

## 2020-07-24 DIAGNOSIS — D689 Coagulation defect, unspecified: Secondary | ICD-10-CM | POA: Diagnosis not present

## 2020-07-24 DIAGNOSIS — N2581 Secondary hyperparathyroidism of renal origin: Secondary | ICD-10-CM | POA: Diagnosis not present

## 2020-07-24 DIAGNOSIS — N186 End stage renal disease: Secondary | ICD-10-CM | POA: Diagnosis not present

## 2020-07-24 LAB — SARS CORONAVIRUS 2 (TAT 6-24 HRS): SARS Coronavirus 2: NEGATIVE

## 2020-07-26 DIAGNOSIS — N186 End stage renal disease: Secondary | ICD-10-CM | POA: Diagnosis not present

## 2020-07-26 DIAGNOSIS — N2581 Secondary hyperparathyroidism of renal origin: Secondary | ICD-10-CM | POA: Diagnosis not present

## 2020-07-26 DIAGNOSIS — Z992 Dependence on renal dialysis: Secondary | ICD-10-CM | POA: Diagnosis not present

## 2020-07-26 DIAGNOSIS — D689 Coagulation defect, unspecified: Secondary | ICD-10-CM | POA: Diagnosis not present

## 2020-07-27 ENCOUNTER — Encounter (HOSPITAL_COMMUNITY)
Admission: RE | Disposition: A | Discharge status: Home / Self Care | Payer: Self-pay | Source: Home / Self Care | Attending: Internal Medicine

## 2020-07-27 ENCOUNTER — Other Ambulatory Visit: Payer: Self-pay

## 2020-07-27 ENCOUNTER — Ambulatory Visit (HOSPITAL_COMMUNITY): Payer: Medicare Other | Admitting: Anesthesiology

## 2020-07-27 ENCOUNTER — Encounter (HOSPITAL_COMMUNITY): Payer: Self-pay | Admitting: Internal Medicine

## 2020-07-27 ENCOUNTER — Ambulatory Visit (HOSPITAL_COMMUNITY)
Admission: RE | Admit: 2020-07-27 | Discharge: 2020-07-27 | Disposition: A | Discharge status: Home / Self Care | Payer: Medicare Other | Attending: Internal Medicine | Admitting: Internal Medicine

## 2020-07-27 DIAGNOSIS — Z86010 Personal history of colonic polyps: Secondary | ICD-10-CM | POA: Diagnosis not present

## 2020-07-27 DIAGNOSIS — M109 Gout, unspecified: Secondary | ICD-10-CM | POA: Insufficient documentation

## 2020-07-27 DIAGNOSIS — D122 Benign neoplasm of ascending colon: Secondary | ICD-10-CM | POA: Insufficient documentation

## 2020-07-27 DIAGNOSIS — D123 Benign neoplasm of transverse colon: Secondary | ICD-10-CM | POA: Diagnosis not present

## 2020-07-27 DIAGNOSIS — Z992 Dependence on renal dialysis: Secondary | ICD-10-CM | POA: Insufficient documentation

## 2020-07-27 DIAGNOSIS — I2510 Atherosclerotic heart disease of native coronary artery without angina pectoris: Secondary | ICD-10-CM | POA: Insufficient documentation

## 2020-07-27 DIAGNOSIS — Z79899 Other long term (current) drug therapy: Secondary | ICD-10-CM | POA: Diagnosis not present

## 2020-07-27 DIAGNOSIS — Z6822 Body mass index (BMI) 22.0-22.9, adult: Secondary | ICD-10-CM | POA: Diagnosis not present

## 2020-07-27 DIAGNOSIS — D509 Iron deficiency anemia, unspecified: Secondary | ICD-10-CM | POA: Insufficient documentation

## 2020-07-27 DIAGNOSIS — K219 Gastro-esophageal reflux disease without esophagitis: Secondary | ICD-10-CM | POA: Insufficient documentation

## 2020-07-27 DIAGNOSIS — I714 Abdominal aortic aneurysm, without rupture: Secondary | ICD-10-CM | POA: Diagnosis not present

## 2020-07-27 DIAGNOSIS — R1310 Dysphagia, unspecified: Secondary | ICD-10-CM | POA: Insufficient documentation

## 2020-07-27 DIAGNOSIS — I252 Old myocardial infarction: Secondary | ICD-10-CM | POA: Insufficient documentation

## 2020-07-27 DIAGNOSIS — I120 Hypertensive chronic kidney disease with stage 5 chronic kidney disease or end stage renal disease: Secondary | ICD-10-CM | POA: Insufficient documentation

## 2020-07-27 DIAGNOSIS — Z7982 Long term (current) use of aspirin: Secondary | ICD-10-CM | POA: Insufficient documentation

## 2020-07-27 DIAGNOSIS — R634 Abnormal weight loss: Secondary | ICD-10-CM | POA: Insufficient documentation

## 2020-07-27 DIAGNOSIS — T18128A Food in esophagus causing other injury, initial encounter: Secondary | ICD-10-CM | POA: Diagnosis not present

## 2020-07-27 DIAGNOSIS — K5730 Diverticulosis of large intestine without perforation or abscess without bleeding: Secondary | ICD-10-CM | POA: Diagnosis not present

## 2020-07-27 DIAGNOSIS — Z87891 Personal history of nicotine dependence: Secondary | ICD-10-CM | POA: Insufficient documentation

## 2020-07-27 DIAGNOSIS — E785 Hyperlipidemia, unspecified: Secondary | ICD-10-CM | POA: Diagnosis not present

## 2020-07-27 DIAGNOSIS — R6881 Early satiety: Secondary | ICD-10-CM | POA: Diagnosis not present

## 2020-07-27 DIAGNOSIS — X58XXXA Exposure to other specified factors, initial encounter: Secondary | ICD-10-CM | POA: Insufficient documentation

## 2020-07-27 DIAGNOSIS — K648 Other hemorrhoids: Secondary | ICD-10-CM | POA: Diagnosis not present

## 2020-07-27 DIAGNOSIS — Z79890 Hormone replacement therapy (postmenopausal): Secondary | ICD-10-CM | POA: Insufficient documentation

## 2020-07-27 DIAGNOSIS — N186 End stage renal disease: Secondary | ICD-10-CM | POA: Diagnosis not present

## 2020-07-27 DIAGNOSIS — Z955 Presence of coronary angioplasty implant and graft: Secondary | ICD-10-CM | POA: Insufficient documentation

## 2020-07-27 DIAGNOSIS — K2970 Gastritis, unspecified, without bleeding: Secondary | ICD-10-CM | POA: Diagnosis not present

## 2020-07-27 DIAGNOSIS — J449 Chronic obstructive pulmonary disease, unspecified: Secondary | ICD-10-CM | POA: Diagnosis not present

## 2020-07-27 DIAGNOSIS — Z9981 Dependence on supplemental oxygen: Secondary | ICD-10-CM | POA: Insufficient documentation

## 2020-07-27 DIAGNOSIS — E039 Hypothyroidism, unspecified: Secondary | ICD-10-CM | POA: Diagnosis not present

## 2020-07-27 DIAGNOSIS — K635 Polyp of colon: Secondary | ICD-10-CM | POA: Diagnosis not present

## 2020-07-27 DIAGNOSIS — Z139 Encounter for screening, unspecified: Secondary | ICD-10-CM | POA: Diagnosis not present

## 2020-07-27 DIAGNOSIS — Z85118 Personal history of other malignant neoplasm of bronchus and lung: Secondary | ICD-10-CM | POA: Insufficient documentation

## 2020-07-27 HISTORY — PX: POLYPECTOMY: SHX5525

## 2020-07-27 HISTORY — PX: BIOPSY: SHX5522

## 2020-07-27 HISTORY — PX: ESOPHAGOGASTRODUODENOSCOPY (EGD) WITH PROPOFOL: SHX5813

## 2020-07-27 HISTORY — PX: COLONOSCOPY WITH PROPOFOL: SHX5780

## 2020-07-27 SURGERY — COLONOSCOPY WITH PROPOFOL
Anesthesia: General

## 2020-07-27 MED ORDER — SODIUM CHLORIDE 0.9 % IV SOLN: INTRAVENOUS | Status: DC

## 2020-07-27 MED ORDER — STERILE WATER FOR IRRIGATION IR SOLN
Status: DC | PRN
  Administered 2020-07-27: 1.5 mL

## 2020-07-27 MED ORDER — OMEPRAZOLE 40 MG PO CPDR: 40.0000 mg | DELAYED_RELEASE_CAPSULE | Freq: Two times a day (BID) | ORAL | 5 refills | Status: DC

## 2020-07-27 MED ORDER — GLYCOPYRROLATE 0.2 MG/ML IJ SOLN: 0.2000 mg | Freq: Once | INTRAMUSCULAR | Status: DC

## 2020-07-27 MED ORDER — LIDOCAINE VISCOUS HCL 2 % MT SOLN
OROMUCOSAL | Status: AC
  Filled 2020-07-27: qty 15

## 2020-07-27 MED ORDER — GLYCOPYRROLATE 0.2 MG/ML IJ SOLN
INTRAMUSCULAR | Status: AC
  Filled 2020-07-27: qty 1

## 2020-07-27 MED ORDER — LACTATED RINGERS IV SOLN: INTRAVENOUS | Status: DC | PRN

## 2020-07-27 MED ORDER — LIDOCAINE VISCOUS HCL 2 % MT SOLN: 15.0000 mL | Freq: Once | OROMUCOSAL | Status: DC

## 2020-07-27 MED ORDER — PROPOFOL 10 MG/ML IV BOLUS
INTRAVENOUS | Status: DC | PRN
  Administered 2020-07-27: 150 ug/kg/min via INTRAVENOUS
  Administered 2020-07-27: 80 mg via INTRAVENOUS

## 2020-07-27 MED ORDER — LIDOCAINE HCL (CARDIAC) PF 50 MG/5ML IV SOSY
PREFILLED_SYRINGE | INTRAVENOUS | Status: DC | PRN
  Administered 2020-07-27: 60 mg via INTRAVENOUS

## 2020-07-27 NOTE — Transfer of Care (Signed)
Immediate Anesthesia Transfer of Care Note  Patient: MONTE ZINNI  Procedure(s) Performed: COLONOSCOPY WITH PROPOFOL (N/A ) ESOPHAGOGASTRODUODENOSCOPY (EGD) WITH PROPOFOL (N/A ) BIOPSY POLYPECTOMY  Patient Location: PACU  Anesthesia Type:General  Level of Consciousness: awake, alert , oriented and patient cooperative  Airway & Oxygen Therapy: Patient Spontanous Breathing and Patient connected to nasal cannula oxygen  Post-op Assessment: Report given to RN, Post -op Vital signs reviewed and stable and Patient moving all extremities  Post vital signs: Reviewed and stable  Last Vitals:  Vitals Value Taken Time  BP 109/62 07/27/20 1152  Temp    Pulse 66 07/27/20 1154  Resp 22 07/27/20 1154  SpO2 95 % 07/27/20 1154  Vitals shown include unvalidated device data.  Last Pain:  Vitals:   07/27/20 1114  TempSrc:   PainSc: 0-No pain         Complications: No complications documented.

## 2020-07-27 NOTE — Progress Notes (Signed)
Patient complaining of left eye irritation. Dr. Wyatt Haste notified and he flushed patients eye with normal saline.

## 2020-07-27 NOTE — H&P (Signed)
Primary Care Physician:  [name], MD Primary Gastroenterologist:  Dr. Abbey Chatters  Pre-Procedure History & Physical: HPI:  Jeremy Johnson is a 74 y.o. male is here for a EGD and diagnostic colonoscopy for dysphagia, anemia, history of adenomatous colon polyps.   Past Medical History:  Diagnosis Date  . Anemia   . Blood transfusion without reported diagnosis   . CAD (coronary artery disease)    STENT... MID CIRCUMFLEX...1997  . Chronic kidney disease    STAGE 3  . COPD (chronic obstructive pulmonary disease) ([place])   . Degenerative joint disease (DJD) of lumbar spine   . GERD (gastroesophageal reflux disease)   . Gout   . Hyperlipidemia   . Hypertension   . Hypothyroidism   . Incisional hernia    abdomen  . Leukocytosis    CHRONIC MILD  . Myocardial infarction ([place])    1997  . SCL CA dx'd 01/2019   Lung cancer    Past Surgical History:  Procedure Laterality Date  . ABDOMINAL AORTIC ANEURYSM REPAIR  2006  . AV FISTULA PLACEMENT Left 04/25/2019   Procedure: ARTERIOVENOUS (AV) FISTULA CREATION LEFT ARM;  Surgeon: Angelia Mould, MD;  Location: [place];  Service: Vascular;  Laterality: Left;  . AV FISTULA PLACEMENT Left 05/19/2019   Procedure: CONVERSION OF LEFT ARM ARTERIOVENOUS FISTULA TO GRAFT;  Surgeon: Angelia Mould, MD;  Location: [place];  Service: Vascular;  Laterality: Left;  . BIOPSY  09/30/2018   Procedure: BIOPSY;  Surgeon: Danie Binder, MD;  [facility];  Service: Endoscopy;;  ascending colon  . COLONOSCOPY  2008  . COLONOSCOPY N/A 09/30/2018   External and internal hemorrhoids, six polyps removed, one ascending colon polypoid lesion biopsied. Six simple adenomas and one benign polypoid lesion. Colonoscopy Nov 2022.   [name] [place]  . HIP ARTHROPLASTY Right 04/30/2020   Procedure: ARTHROPLASTY  HIP (HEMIARTHROPLASTY);  Surgeon: Altamese Viburnum, MD;  Location: [place];  Service: Orthopedics;   Laterality: Right;  . IR FLUORO GUIDE CV LINE RIGHT  04/22/2019  . IR FLUORO GUIDE CV LINE RIGHT  05/01/2020  . IR THROMBECTOMY AV FISTULA W/THROMBOLYSIS/PTA INC/SHUNT/IMG LEFT Left 05/03/2020  . IR US GUIDE VASC ACCESS LEFT  05/03/2020  . IR US GUIDE VASC ACCESS RIGHT  04/22/2019  . IR US GUIDE VASC ACCESS RIGHT  05/01/2020  . POLYPECTOMY  09/30/2018   Procedure: POLYPECTOMY;  Surgeon: Danie Binder, MD;  [facility];  Service: Endoscopy;;  colon  . VIDEO BRONCHOSCOPY WITH ENDOBRONCHIAL NAVIGATION N/A 01/27/2019   Procedure: VIDEO BRONCHOSCOPY WITH ENDOBRONCHIAL NAVIGATION;  Surgeon: Grace Isaac, MD;  Location: [place];  Service: Thoracic;  Laterality: N/A;  . VIDEO BRONCHOSCOPY WITH ENDOBRONCHIAL ULTRASOUND N/A 01/27/2019   Procedure: VIDEO BRONCHOSCOPY WITH ENDOBRONCHIAL ULTRASOUND;  Surgeon: Grace Isaac, MD;  Location: [place];  Service: Thoracic;  Laterality: N/A;    Prior to Admission medications   Medication Sig Start Date End Date Taking? Authorizing Provider  acetaminophen (TYLENOL) 325 MG tablet Take 2 tablets (650 mg total) by mouth every 6 (six) hours as needed for mild pain (or Fever >/= 101). Patient taking differently: Take 1,300 mg by mouth every 6 (six) hours as needed for mild pain or headache (or Fever >/= 101). Arthritis strength 05/25/19  Yes Swayze, Ava, DO  albuterol (VENTOLIN HFA) 108 (90 Base) MCG/ACT inhaler Inhale 2 puffs into the lungs every 6 (six) hours as needed  for wheezing or shortness of breath.  02/02/20  Yes [provider]  allopurinol (ZYLOPRIM) 100 MG tablet Take 1 tablet (100 mg total) by mouth daily. Patient taking differently: Take 100 mg by mouth in the morning.  04/26/19  Yes Mercy Riding, MD  amLODipine (NORVASC) 10 MG tablet Take 10 mg by mouth at bedtime.  08/16/19  Yes [provider]  aspirin EC 81 MG tablet Take 81 mg by mouth at bedtime.   Yes [provider]  B Complex-C-Zn-Folic Acid (DIALYVITE 409-WJXB  15) 0.8 MG TABS Take 1 tablet by mouth daily. 04/22/20  Yes [provider]  clindamycin (CLEOCIN-T) 1 % lotion Apply to affected areas once daily 7 days a week as needed for rash/itch. Patient taking differently: Apply 1 application topically daily as needed (Rash/ itching).  06/21/20  Yes Ralene Bathe, MD  Darbepoetin Alfa (ARANESP) 100 MCG/0.5ML SOSY injection Inject 0.5 mLs (100 mcg total) into the vein every Thursday with hemodialysis. 05/01/19  Yes Wendee Beavers T, MD  iron sucrose in sodium chloride 0.9 % 100 mL 50 mg. 04/06/20 04/05/21 Yes [provider]  levothyroxine (SYNTHROID, LEVOTHROID) 175 MCG tablet Take 175 mcg by mouth daily before breakfast.    Yes [provider]  Methoxy PEG-Epoetin Beta (MIRCERA IJ) Inject 30 mg into the vein every 28 (twenty-eight) days.  03/23/20 03/22/21 Yes [provider]  metoprolol tartrate (LOPRESSOR) 50 MG tablet Take 25 mg by mouth 2 (two) times daily.    Yes [provider]  omeprazole (PRILOSEC) 40 MG capsule Take 40 mg by mouth in the morning.  08/15/19  Yes [provider]  pramipexole (MIRAPEX) 0.25 MG tablet Take 0.25 mg by mouth at bedtime. 03/31/20  Yes [provider]  rosuvastatin (CRESTOR) 10 MG tablet Take 10 mg by mouth at bedtime. 01/30/20  Yes [provider]  triamcinolone cream (KENALOG) 0.1 % Apply to aa's rash BID until clear then PRN thereafter. Avoid face, groin, and axilla. Patient taking differently: Apply 1 application topically See admin instructions. Apply to aa's rash BID until clear then PRN thereafter. Avoid face, groin, and axilla. 06/09/20  Yes Ralene Bathe, MD    Allergies as of 06/21/2020 - Review Complete 06/21/2020  Allergen Reaction Noted  . Advair hfa [fluticasone-salmeterol] Other (See Comments) 04/29/2020  . Penicillins Rash 02/09/2010    Family History  Problem Relation Age of Onset  . Stroke Brother   . Lung cancer Sister 65       lung  cancer/former  . Colon cancer Neg Hx   . Colon polyps Neg Hx     Social History   Socioeconomic History  . Marital status: Married    Spouse name: Not on file  . Number of children: 2  . Years of education: Not on file  . Highest education level: Not on file  Occupational History    Comment: retired  Tobacco Use  . Smoking status: Former Smoker    Packs/day: 0.50    Years: 54.00    Pack years: 27.00    Quit date: 09/17/2017    Years since quitting: 2.8  . Smokeless tobacco: Never Used  Vaping Use  . Vaping Use: Never used  Substance and Sexual Activity  . Alcohol use: Not Currently    Comment: occasional  . Drug use: No  . Sexual activity: Not Currently  Other Topics Concern  . Not on file  Social History Narrative  . Not on file  Social Determinants of Health   Financial Resource Strain:   . Difficulty of Paying Living Expenses: Not on file  Food Insecurity:   . Worried About Charity fundraiser in the Last Year: Not on file  . Ran Out of Food in the Last Year: Not on file  Transportation Needs:   . Lack of Transportation (Medical): Not on file  . Lack of Transportation (Non-Medical): Not on file  Physical Activity:   . Days of Exercise per Week: Not on file  . Minutes of Exercise per Session: Not on file  Stress:   . Feeling of Stress : Not on file  Social Connections:   . Frequency of Communication with Friends and Family: Not on file  . Frequency of Social Gatherings with Friends and Family: Not on file  . Attends Religious Services: Not on file  . Active Member of Clubs or Organizations: Not on file  . Attends Archivist Meetings: Not on file  . Marital Status: Not on file  Intimate Partner Violence:   . Fear of Current or Ex-Partner: Not on file  . Emotionally Abused: Not on file  . Physically Abused: Not on file  . Sexually Abused: Not on file    Review of Systems: See HPI, otherwise negative ROS  Impression/Plan: Jeremy Johnson is  here for a EGD and colonoscopy to be performed for dysphagia, anemia, history of adenomatous colon polyps.   Risks, benefits, limitations, imponderables and alternatives regarding colonoscopy have been reviewed with the patient. Questions have been answered. All parties agreeable.

## 2020-07-27 NOTE — Op Note (Signed)
[facility] Patient Name: Jeremy Johnson Procedure Date: 07/27/2020 10:58 AM MRN: [id number] Date of Birth: December 15, 1945 Attending MD: Elon Alas. Abbey Chatters DO CSN: [id number] Age: 74 Admit Type: Outpatient Procedure:                Upper GI endoscopy Indications:              Dysphagia, Early satiety, Weight loss Providers:                Elon Alas. Sevin Langenbach, DO, [place]is Peak B. Sharon Seller, RN,                            Caprice Kluver, Raphael Gibney, Technician Referring MD:              Medicines:                See the Anesthesia note for documentation of the                            administered medications Complications:            No immediate complications. Estimated Blood Loss:     Estimated blood loss was minimal. Procedure:                Pre-Anesthesia Assessment:                           - The anesthesia plan was to use monitored                            anesthesia care (MAC).                           After obtaining informed consent, the endoscope was                            passed under direct vision. Throughout the                            procedure, the patient's blood pressure, pulse, and                            oxygen saturations were monitored continuously. The                            GIF-H190 ([id number]) scope was introduced through the                            mouth, and advanced to the second part of duodenum.                            The upper GI endoscopy was accomplished without                            difficulty. The patient tolerated the procedure  well. Scope In: 11:19:29 AM Scope Out: 11:22:47 AM Total Procedure Duration: 0 hours 3 minutes 18 seconds  Findings:      Food was found in the middle third of the esophagus. This was pushed       into the stomach with endoscope.      Localized moderate inflammation characterized by erosions and erythema       was found in the gastric antrum. Biopsies were taken with a cold  forceps       for Helicobacter pylori testing.      Localized nodular mucosa was found on the lesser curvature of the       stomach. Biopsies were taken with a cold forceps for histology.      The duodenal bulb, first portion of the duodenum and second portion of       the duodenum were normal. Impression:               - Food in the middle third of the esophagus.                           - Gastritis. Biopsied.                           - Nodular mucosa in the lesser curvature of the                            stomach. Biopsied.                           - Normal duodenal bulb, first portion of the                            duodenum and second portion of the duodenum. Moderate Sedation:      Per Anesthesia Care Recommendation:           - Patient has a contact number available for                            emergencies. The signs and symptoms of potential                            delayed complications were discussed with the                            patient. Return to normal activities tomorrow.                            Written discharge instructions were provided to the                            patient.                           - Resume previous diet.                           - Continue present medications.                           -  Await pathology results.                           - Use Prilosec (omeprazole) 40 mg PO BID for 8                            weeks then back down to daily.                           - No ibuprofen, naproxen, or other non-steroidal                            anti-inflammatory drugs. Procedure Code(s):        --- Professional ---                           [phone], Esophagogastroduodenoscopy, flexible,                            transoral; with biopsy, single or multiple Diagnosis Code(s):        --- Professional ---                           [phone], Food in esophagus causing other injury,                            initial encounter                            K29.70, Gastritis, unspecified, without bleeding                           K31.89, Other diseases of stomach and duodenum                           R13.10, Dysphagia, unspecified                           R68.81, Early satiety                           R63.4, Abnormal weight loss CPT copyright 2019 American Medical Association. All rights reserved. The codes documented in this report are preliminary and upon coder review may  be revised to meet current compliance requirements. Elon Alas. Abbey Chatters, [place] Abbey Chatters, DO 07/27/2020 11:25:42 AM This report has been signed electronically. Number of Addenda: 0

## 2020-07-27 NOTE — Discharge Instructions (Signed)
EGD Discharge instructions Please read the instructions outlined below and refer to this sheet in the next few weeks. These discharge instructions provide you with general information on caring for yourself after you leave the hospital. Your doctor may also give you specific instructions. While your treatment has been planned according to the most current medical practices available, unavoidable complications occasionally occur. If you have any problems or questions after discharge, please call your doctor. ACTIVITY  You may resume your regular activity but move at a slower pace for the next 24 hours.   Take frequent rest periods for the next 24 hours.   Walking will help expel (get rid of) the air and reduce the bloated feeling in your abdomen.   No driving for 24 hours (because of the anesthesia (medicine) used during the test).   You may shower.   Do not sign any important legal documents or operate any machinery for 24 hours (because of the anesthesia used during the test).  NUTRITION  Drink plenty of fluids.   You may resume your normal diet.   Begin with a light meal and progress to your normal diet.   Avoid alcoholic beverages for 24 hours or as instructed by your caregiver.  MEDICATIONS  You may resume your normal medications unless your caregiver tells you otherwise.  WHAT YOU CAN EXPECT TODAY  You may experience abdominal discomfort such as a feeling of fullness or "gas" pains.  FOLLOW-UP  Your doctor will discuss the results of your test with you.  SEEK IMMEDIATE MEDICAL ATTENTION IF ANY OF THE FOLLOWING OCCUR:  Excessive nausea (feeling sick to your stomach) and/or vomiting.   Severe abdominal pain and distention (swelling).   Trouble swallowing.   Temperature over 101 F (37.8 C).   Rectal bleeding or vomiting of blood.    Colonoscopy Discharge Instructions  Read the instructions outlined below and refer to this sheet in the next few weeks. These  discharge instructions provide you with general information on caring for yourself after you leave the hospital. Your doctor may also give you specific instructions. While your treatment has been planned according to the most current medical practices available, unavoidable complications occasionally occur.   ACTIVITY  You may resume your regular activity, but move at a slower pace for the next 24 hours.   Take frequent rest periods for the next 24 hours.   Walking will help get rid of the air and reduce the bloated feeling in your belly (abdomen).   No driving for 24 hours (because of the medicine (anesthesia) used during the test).    Do not sign any important legal documents or operate any machinery for 24 hours (because of the anesthesia used during the test).  NUTRITION  Drink plenty of fluids.   You may resume your normal diet as instructed by your doctor.   Begin with a light meal and progress to your normal diet. Heavy or fried foods are harder to digest and may make you feel sick to your stomach (nauseated).   Avoid alcoholic beverages for 24 hours or as instructed.  MEDICATIONS  You may resume your normal medications unless your doctor tells you otherwise.  WHAT YOU CAN EXPECT TODAY  Some feelings of bloating in the abdomen.   Passage of more gas than usual.   Spotting of blood in your stool or on the toilet paper.  IF YOU HAD POLYPS REMOVED DURING THE COLONOSCOPY:  No aspirin products for 7 days or as instructed.  No alcohol for 7 days or as instructed.   Eat a soft diet for the next 24 hours.  FINDING OUT THE RESULTS OF YOUR TEST Not all test results are available during your visit. If your test results are not back during the visit, make an appointment with your caregiver to find out the results. Do not assume everything is normal if you have not heard from your caregiver or the medical facility. It is important for you to follow up on all of your test results.    SEEK IMMEDIATE MEDICAL ATTENTION IF:  You have more than a spotting of blood in your stool.   Your belly is swollen (abdominal distention).   You are nauseated or vomiting.   You have a temperature over 101.   You have abdominal pain or discomfort that is severe or gets worse throughout the day.   Your EGD showed food particles in your esophagus which I pushed into your stomach.  You also had evidence of inflammation and erosions in your stomach which I biopsied to rule out H. pylori infection.  I should have these results by the end of the week.  My office will contact you.  I would recommend that you increase your omeprazole to 40 mg twice daily for the next 8 weeks and then can decrease back down to once daily.  Follow-up with GI as previously scheduled.  Your colonoscopy showed 5 polyps which I removed successfully.  Based on current guidelines I would recommend we repeat this in 3 years for surveillance purposes.`  I hope you have a great rest of your week!  Elon Alas. Abbey Chatters, D.O. Gastroenterology and Hepatology [facility]

## 2020-07-27 NOTE — Anesthesia Postprocedure Evaluation (Addendum)
Anesthesia Post Note  Patient: Jeremy Johnson  Procedure(s) Performed: COLONOSCOPY WITH PROPOFOL (N/A ) ESOPHAGOGASTRODUODENOSCOPY (EGD) WITH PROPOFOL (N/A ) BIOPSY POLYPECTOMY  Patient location during evaluation: PACU Anesthesia Type: General Level of consciousness: awake, oriented and awake and alert Pain management: pain level controlled Vital Signs Assessment: post-procedure vital signs reviewed and stable Respiratory status: spontaneous breathing, respiratory function stable, nonlabored ventilation and patient connected to nasal cannula oxygen Cardiovascular status: blood pressure returned to baseline and stable Postop Assessment: no headache and no backache Anesthetic complications: no Comments: C/o burning sensation in his left eye in PACU, irrigated with normal saline. Patient felt better after irrigation and was able to open his eye.   No complications documented.   Last Vitals:  Vitals:   07/27/20 0939  BP: 124/73  Pulse: 81  Resp: (!) 24  Temp: 36.9 C  SpO2: 96%    Last Pain:  Vitals:   07/27/20 1114  TempSrc:   PainSc: 0-No pain                 Tacy Learn

## 2020-07-27 NOTE — Anesthesia Preprocedure Evaluation (Addendum)
Anesthesia Evaluation  Patient identified by MRN, date of birth, ID band Patient awake    Reviewed: Allergy & Precautions, NPO status , Patient's Chart, lab work & pertinent test results, reviewed documented beta blocker date and time   History of Anesthesia Complications Negative for: history of anesthetic complications  Airway Mallampati: II  TM Distance: >3 FB Neck ROM: Full    Dental  (+) Teeth Intact, Dental Advisory Given, Caps   Pulmonary COPD (uses oxygen during night),  COPD inhaler and oxygen dependent, Patient abstained from smoking., former smoker,    Pulmonary exam normal breath sounds clear to auscultation       Cardiovascular hypertension, Pt. on medications and Pt. on home beta blockers + CAD, + Past MI and + Cardiac Stents  Normal cardiovascular exam Rhythm:Regular Rate:Normal     Neuro/Psych negative psych ROS   GI/Hepatic GERD  Medicated and Controlled,  Endo/Other  Hypothyroidism   Renal/GU Dialysis and ESRFRenal disease     Musculoskeletal  (+) Arthritis  (gout),   Abdominal   Peds  Hematology  (+) anemia ,   Anesthesia Other Findings   Reproductive/Obstetrics                            Anesthesia Physical Anesthesia Plan  ASA: IV  Anesthesia Plan: General   Post-op Pain Management:    Induction: Intravenous  PONV Risk Score and Plan: TIVA  Airway Management Planned: Nasal Cannula and Natural Airway  Additional Equipment:   Intra-op Plan:   Post-operative Plan:   Informed Consent: I have reviewed the patients History and Physical, chart, labs and discussed the procedure including the risks, benefits and alternatives for the proposed anesthesia with the patient or authorized representative who has indicated his/her understanding and acceptance.     Dental advisory given  Plan Discussed with: CRNA and Surgeon  Anesthesia Plan Comments:          Anesthesia Quick Evaluation

## 2020-07-27 NOTE — Op Note (Signed)
[facility] Patient Name: Jeremy Johnson Procedure Date: 07/27/2020 11:26 AM MRN: [id number] Date of Birth: [birth year] Attending MD: Elon Alas. Edgar Frisk CSN: [id number] Age: 74 Admit Type: Outpatient Procedure:                Colonoscopy Indications:              High risk colon cancer surveillance: Personal                            history of colonic polyps, Incidental - Iron                            deficiency anemia Providers:                Elon Alas. Misaki Sozio, DO, Otis Peak B. Sharon Seller, RN,                            Raphael Gibney, Technician Referring MD:              Medicines:                See the Anesthesia note for documentation of the                            administered medications Complications:            No immediate complications. Estimated Blood Loss:     Estimated blood loss was minimal. Procedure:                Pre-Anesthesia Assessment:                           - The anesthesia plan was to use monitored                            anesthesia care (MAC).                           After obtaining informed consent, the colonoscope                            was passed under direct vision. Throughout the                            procedure, the patient's blood pressure, pulse, and                            oxygen saturations were monitored continuously. The                            [id number]-H190DL ([id number]) scope was introduced through                            the anus and advanced to the the cecum, identified                            by appendiceal orifice and ileocecal valve. The  colonoscopy was performed without difficulty. The                            patient tolerated the procedure well. The quality                            of the bowel preparation was evaluated using the                            BBPS [facility] Bowel Preparation Scale) with scores                            of: Right Colon = 2 (minor amount of  residual                            staining, small fragments of stool and/or opaque                            liquid, but mucosa seen well), Transverse Colon = 2                            (minor amount of residual staining, small fragments                            of stool and/or opaque liquid, but mucosa seen                            well) and Left Colon = 3 (entire mucosa seen well                            with no residual staining, small fragments of stool                            or opaque liquid). The total BBPS score equals 7.                            The quality of the bowel preparation was good. Scope In: 11:27:07 AM Scope Out: 11:48:23 AM Scope Withdrawal Time: 0 hours 12 minutes 0 seconds  Total Procedure Duration: 0 hours 21 minutes 16 seconds  Findings:      The perianal and digital rectal examinations were normal.      Non-bleeding internal hemorrhoids were found during endoscopy.      Multiple small-mouthed diverticula were found in the sigmoid colon and       descending colon.      Four sessile polyps were found in the ascending colon. The polyps were 1       to 2 mm in size. These polyps were removed with a cold biopsy forceps.       Resection and retrieval were complete.      A 5 mm polyp was found in the transverse colon. The polyp was sessile.       The polyp was removed with a cold snare. Resection and retrieval were       complete. Impression:               -  Non-bleeding internal hemorrhoids.                           - Diverticulosis in the sigmoid colon and in the                            descending colon.                           - Four 1 to 2 mm polyps in the ascending colon,                            removed with a cold biopsy forceps. Resected and                            retrieved.                           - One 5 mm polyp in the transverse colon, removed                            with a cold snare. Resected and retrieved. Moderate  Sedation:      Per Anesthesia Care Recommendation:           - Patient has a contact number available for                            emergencies. The signs and symptoms of potential                            delayed complications were discussed with the                            patient. Return to normal activities tomorrow.                            Written discharge instructions were provided to the                            patient.                           - Resume previous diet.                           - Continue present medications.                           - Await pathology results.                           - Repeat colonoscopy in 3 years for surveillance.                           - Return to GI clinic as previously scheduled. Procedure Code(s):        --- Professional ---  45385, Colonoscopy, flexible; with removal of                            tumor(s), polyp(s), or other lesion(s) by snare                            technique                           45380, 51, Colonoscopy, flexible; with biopsy,                            single or multiple Diagnosis Code(s):        --- Professional ---                           K63.5, Polyp of colon                           Z86.010, Personal history of colonic polyps                           K64.8, Other hemorrhoids                           K57.30, Diverticulosis of large intestine without                            perforation or abscess without bleeding CPT copyright 2019 American Medical Association. All rights reserved. The codes documented in this report are preliminary and upon coder review may  be revised to meet current compliance requirements. Elon Alas. Abbey Chatters, [place] Abbey Chatters, DO 07/27/2020 11:53:45 AM This report has been signed electronically. Number of Addenda: 0

## 2020-07-28 ENCOUNTER — Other Ambulatory Visit: Payer: Self-pay

## 2020-07-28 LAB — POCT I-STAT, CHEM 8
BUN: 18 mg/dL (ref 8–23)
Calcium, Ion: 1.13 mmol/L — ABNORMAL LOW (ref 1.15–1.40)
Chloride: 92 mmol/L — ABNORMAL LOW (ref 98–111)
Creatinine, Ser: 4.8 mg/dL — ABNORMAL HIGH (ref 0.61–1.24)
Glucose, Bld: 93 mg/dL (ref 70–99)
HCT: 25 % — ABNORMAL LOW (ref 39.0–52.0)
Hemoglobin: 8.5 g/dL — ABNORMAL LOW (ref 13.0–17.0)
Potassium: 4.1 mmol/L (ref 3.5–5.1)
Sodium: 134 mmol/L — ABNORMAL LOW (ref 135–145)
TCO2: 27 mmol/L (ref 22–32)

## 2020-07-28 LAB — SURGICAL PATHOLOGY

## 2020-07-29 DIAGNOSIS — Z992 Dependence on renal dialysis: Secondary | ICD-10-CM | POA: Diagnosis not present

## 2020-07-29 DIAGNOSIS — N2581 Secondary hyperparathyroidism of renal origin: Secondary | ICD-10-CM | POA: Diagnosis not present

## 2020-07-29 DIAGNOSIS — N186 End stage renal disease: Secondary | ICD-10-CM | POA: Diagnosis not present

## 2020-07-29 DIAGNOSIS — D689 Coagulation defect, unspecified: Secondary | ICD-10-CM | POA: Diagnosis not present

## 2020-07-30 ENCOUNTER — Encounter (HOSPITAL_COMMUNITY): Payer: Self-pay | Admitting: Internal Medicine

## 2020-07-31 DIAGNOSIS — N2581 Secondary hyperparathyroidism of renal origin: Secondary | ICD-10-CM | POA: Diagnosis not present

## 2020-07-31 DIAGNOSIS — Z992 Dependence on renal dialysis: Secondary | ICD-10-CM | POA: Diagnosis not present

## 2020-07-31 DIAGNOSIS — N186 End stage renal disease: Secondary | ICD-10-CM | POA: Diagnosis not present

## 2020-07-31 DIAGNOSIS — D689 Coagulation defect, unspecified: Secondary | ICD-10-CM | POA: Diagnosis not present

## 2020-08-02 ENCOUNTER — Other Ambulatory Visit (HOSPITAL_COMMUNITY): Payer: Self-pay | Admitting: Internal Medicine

## 2020-08-02 DIAGNOSIS — M80059A Age-related osteoporosis with current pathological fracture, unspecified femur, initial encounter for fracture: Secondary | ICD-10-CM

## 2020-08-02 DIAGNOSIS — J449 Chronic obstructive pulmonary disease, unspecified: Secondary | ICD-10-CM | POA: Diagnosis not present

## 2020-08-02 DIAGNOSIS — C3490 Malignant neoplasm of unspecified part of unspecified bronchus or lung: Secondary | ICD-10-CM | POA: Diagnosis not present

## 2020-08-02 DIAGNOSIS — I712 Thoracic aortic aneurysm, without rupture: Secondary | ICD-10-CM | POA: Diagnosis not present

## 2020-08-02 DIAGNOSIS — M84459A Pathological fracture, hip, unspecified, initial encounter for fracture: Secondary | ICD-10-CM | POA: Diagnosis not present

## 2020-08-02 DIAGNOSIS — Z23 Encounter for immunization: Secondary | ICD-10-CM | POA: Diagnosis not present

## 2020-08-03 DIAGNOSIS — D689 Coagulation defect, unspecified: Secondary | ICD-10-CM | POA: Diagnosis not present

## 2020-08-03 DIAGNOSIS — N2581 Secondary hyperparathyroidism of renal origin: Secondary | ICD-10-CM | POA: Diagnosis not present

## 2020-08-03 DIAGNOSIS — Z992 Dependence on renal dialysis: Secondary | ICD-10-CM | POA: Diagnosis not present

## 2020-08-03 DIAGNOSIS — N186 End stage renal disease: Secondary | ICD-10-CM | POA: Diagnosis not present

## 2020-08-05 ENCOUNTER — Ambulatory Visit (INDEPENDENT_AMBULATORY_CARE_PROVIDER_SITE_OTHER): Payer: Medicare Other | Admitting: Dermatology

## 2020-08-05 ENCOUNTER — Other Ambulatory Visit: Payer: Self-pay

## 2020-08-05 DIAGNOSIS — L111 Transient acantholytic dermatosis [Grover]: Secondary | ICD-10-CM

## 2020-08-05 DIAGNOSIS — N2581 Secondary hyperparathyroidism of renal origin: Secondary | ICD-10-CM | POA: Diagnosis not present

## 2020-08-05 DIAGNOSIS — N186 End stage renal disease: Secondary | ICD-10-CM | POA: Diagnosis not present

## 2020-08-05 DIAGNOSIS — D689 Coagulation defect, unspecified: Secondary | ICD-10-CM | POA: Diagnosis not present

## 2020-08-05 DIAGNOSIS — Z992 Dependence on renal dialysis: Secondary | ICD-10-CM | POA: Diagnosis not present

## 2020-08-05 MED ORDER — CALCIPOTRIENE 0.005 % EX FOAM: CUTANEOUS | 2 refills | Status: DC

## 2020-08-05 NOTE — Progress Notes (Signed)
   Follow-Up Visit   Subjective  Jeremy Johnson is a 74 y.o. male who presents for the following: Follow-up (1 month f/u biopsy proven Grovers disease, treating with TMC cream, Clindamycin lotion with a poor response ).  The following portions of the chart were reviewed this encounter and updated as appropriate:  Tobacco  Allergies  Meds  Problems  Med Hx  Surg Hx  Fam Hx     Review of Systems:  No other skin or systemic complaints except as noted in HPI or Assessment and Plan.  Objective  Well appearing patient in no apparent distress; mood and affect are within normal limits.  A focused examination was performed including face, exts, trunk. Relevant physical exam findings are noted in the Assessment and Plan.  Objective  back, chest: Red papules and papulovesicles.    Assessment & Plan  Grover's disease -biopsy-proven, severe persistent and flared.  Patient has not got relief despite multiple treatments.  Would like more aggressive treatment today. back, chest Biopsy proven Grovers disease  Treatment options we will start Calcipotriene foam apply to skin daily  If no better consider systemic isotretinoin.  if isotretinoin is not helpful, then we may consider systemic Dupixent injections   Ordered Medications: Calcipotriene 0.005 % FOAM  Return in about 1 month (around 09/04/2020) for Grovers disease .  IMarye Round, CMA, am acting as scribe for Sarina Ser, MD .  Documentation: I have reviewed the above documentation for accuracy and completeness, and I agree with the above.  Sarina Ser, MD

## 2020-08-06 ENCOUNTER — Encounter: Payer: Self-pay | Admitting: Dermatology

## 2020-08-07 DIAGNOSIS — J449 Chronic obstructive pulmonary disease, unspecified: Secondary | ICD-10-CM | POA: Diagnosis not present

## 2020-08-07 DIAGNOSIS — Z992 Dependence on renal dialysis: Secondary | ICD-10-CM | POA: Diagnosis not present

## 2020-08-07 DIAGNOSIS — D689 Coagulation defect, unspecified: Secondary | ICD-10-CM | POA: Diagnosis not present

## 2020-08-07 DIAGNOSIS — N2581 Secondary hyperparathyroidism of renal origin: Secondary | ICD-10-CM | POA: Diagnosis not present

## 2020-08-07 DIAGNOSIS — N186 End stage renal disease: Secondary | ICD-10-CM | POA: Diagnosis not present

## 2020-08-07 DIAGNOSIS — C3490 Malignant neoplasm of unspecified part of unspecified bronchus or lung: Secondary | ICD-10-CM | POA: Diagnosis not present

## 2020-08-10 DIAGNOSIS — Z992 Dependence on renal dialysis: Secondary | ICD-10-CM | POA: Diagnosis not present

## 2020-08-10 DIAGNOSIS — D689 Coagulation defect, unspecified: Secondary | ICD-10-CM | POA: Diagnosis not present

## 2020-08-10 DIAGNOSIS — N186 End stage renal disease: Secondary | ICD-10-CM | POA: Diagnosis not present

## 2020-08-10 DIAGNOSIS — N2581 Secondary hyperparathyroidism of renal origin: Secondary | ICD-10-CM | POA: Diagnosis not present

## 2020-08-11 ENCOUNTER — Ambulatory Visit (HOSPITAL_COMMUNITY)
Admission: RE | Admit: 2020-08-11 | Discharge: 2020-08-11 | Disposition: A | Discharge status: Home / Self Care | Payer: Medicare Other | Source: Ambulatory Visit | Attending: Internal Medicine | Admitting: Internal Medicine

## 2020-08-11 ENCOUNTER — Other Ambulatory Visit: Payer: Self-pay

## 2020-08-11 DIAGNOSIS — R634 Abnormal weight loss: Secondary | ICD-10-CM | POA: Diagnosis not present

## 2020-08-11 DIAGNOSIS — M80059A Age-related osteoporosis with current pathological fracture, unspecified femur, initial encounter for fracture: Secondary | ICD-10-CM | POA: Diagnosis not present

## 2020-08-11 DIAGNOSIS — R29890 Loss of height: Secondary | ICD-10-CM | POA: Diagnosis not present

## 2020-08-11 DIAGNOSIS — Z13820 Encounter for screening for osteoporosis: Secondary | ICD-10-CM | POA: Diagnosis not present

## 2020-08-11 DIAGNOSIS — N19 Unspecified kidney failure: Secondary | ICD-10-CM | POA: Diagnosis not present

## 2020-08-11 DIAGNOSIS — M85852 Other specified disorders of bone density and structure, left thigh: Secondary | ICD-10-CM | POA: Diagnosis not present

## 2020-08-12 DIAGNOSIS — I129 Hypertensive chronic kidney disease with stage 1 through stage 4 chronic kidney disease, or unspecified chronic kidney disease: Secondary | ICD-10-CM | POA: Diagnosis not present

## 2020-08-12 DIAGNOSIS — N2581 Secondary hyperparathyroidism of renal origin: Secondary | ICD-10-CM | POA: Diagnosis not present

## 2020-08-12 DIAGNOSIS — Z992 Dependence on renal dialysis: Secondary | ICD-10-CM | POA: Diagnosis not present

## 2020-08-12 DIAGNOSIS — D689 Coagulation defect, unspecified: Secondary | ICD-10-CM | POA: Diagnosis not present

## 2020-08-12 DIAGNOSIS — N186 End stage renal disease: Secondary | ICD-10-CM | POA: Diagnosis not present

## 2020-08-14 DIAGNOSIS — N186 End stage renal disease: Secondary | ICD-10-CM | POA: Diagnosis not present

## 2020-08-14 DIAGNOSIS — N2581 Secondary hyperparathyroidism of renal origin: Secondary | ICD-10-CM | POA: Diagnosis not present

## 2020-08-14 DIAGNOSIS — D689 Coagulation defect, unspecified: Secondary | ICD-10-CM | POA: Diagnosis not present

## 2020-08-14 DIAGNOSIS — Z992 Dependence on renal dialysis: Secondary | ICD-10-CM | POA: Diagnosis not present

## 2020-08-14 DIAGNOSIS — E039 Hypothyroidism, unspecified: Secondary | ICD-10-CM | POA: Diagnosis not present

## 2020-08-16 ENCOUNTER — Telehealth: Payer: Self-pay

## 2020-08-16 DIAGNOSIS — S72001D Fracture of unspecified part of neck of right femur, subsequent encounter for closed fracture with routine healing: Secondary | ICD-10-CM | POA: Diagnosis not present

## 2020-08-16 NOTE — Telephone Encounter (Signed)
Pharmacy left a VM requesting a replacement for the Calcipotriene 0.005% foam. They stated that it is only available as a brand name and would be $1000+ out of pocket for pt.

## 2020-08-16 NOTE — Telephone Encounter (Signed)
If he cannot afford medication (and I feel this is too much), we can schedule him an appt for a trial of Dupixent injections if he would like to pursue that treatment option.

## 2020-08-16 NOTE — Telephone Encounter (Signed)
He has a follow up with you already scheduled on 09/08/20. Do you want him in sooner?

## 2020-08-17 ENCOUNTER — Ambulatory Visit (INDEPENDENT_AMBULATORY_CARE_PROVIDER_SITE_OTHER): Payer: Medicare Other | Admitting: Dermatology

## 2020-08-17 ENCOUNTER — Other Ambulatory Visit: Payer: Self-pay

## 2020-08-17 DIAGNOSIS — N186 End stage renal disease: Secondary | ICD-10-CM | POA: Diagnosis not present

## 2020-08-17 DIAGNOSIS — L299 Pruritus, unspecified: Secondary | ICD-10-CM

## 2020-08-17 DIAGNOSIS — L2081 Atopic neurodermatitis: Secondary | ICD-10-CM

## 2020-08-17 DIAGNOSIS — N2581 Secondary hyperparathyroidism of renal origin: Secondary | ICD-10-CM | POA: Diagnosis not present

## 2020-08-17 DIAGNOSIS — L111 Transient acantholytic dermatosis [Grover]: Secondary | ICD-10-CM

## 2020-08-17 DIAGNOSIS — Z992 Dependence on renal dialysis: Secondary | ICD-10-CM | POA: Diagnosis not present

## 2020-08-17 DIAGNOSIS — D689 Coagulation defect, unspecified: Secondary | ICD-10-CM | POA: Diagnosis not present

## 2020-08-17 DIAGNOSIS — E039 Hypothyroidism, unspecified: Secondary | ICD-10-CM | POA: Diagnosis not present

## 2020-08-17 MED ORDER — DUPILUMAB 300 MG/2ML ~~LOC~~ SOSY
300.0000 mg | PREFILLED_SYRINGE | Freq: Once | SUBCUTANEOUS | Status: AC
  Administered 2020-08-17: 300 mg via SUBCUTANEOUS

## 2020-08-17 NOTE — Telephone Encounter (Signed)
Since he cannot get Calcipotriene foam, it may be best to see sooner and start Dupixent trial. Please get scheduled sooner if possible.

## 2020-08-17 NOTE — Progress Notes (Signed)
   Follow-Up Visit   Subjective  Jeremy Johnson is a 74 y.o. male who presents for the following: Grover's disease (Patient is here today to discuss and start Dupixent injections. ).  The following portions of the chart were reviewed this encounter and updated as appropriate:  Tobacco  Allergies  Meds  Problems  Med Hx  Surg Hx  Fam Hx     Review of Systems:  No other skin or systemic complaints except as noted in HPI or Assessment and Plan.  Objective  Well appearing patient in no apparent distress; mood and affect are within normal limits.  A focused examination was performed including the abdomen and arms. Relevant physical exam findings are noted in the Assessment and Plan.  Objective  Trunk and extremities: Excoriation and crust on the R upper back.   Assessment & Plan  Grover's disease -biopsy-proven Trunk and extremities With atopic neurodermatitis and pruritus -severe and persistent despite multiple treatment options that he failed.  He continues to have a lot of itch.  Discussed with patient that Dupilumab (Dupixent) is a treatment given by injection for adults with moderate-to-severe atopic dermatitis. Goal is control of skin condition, not cure. It is given as 2 injections at the first dose followed by 1 injection ever 2 weeks thereafter.  Discussed that I cannot guarantee that that his itch and rash will resolve, but there has been nothing that seems to have helped so far and this is his best option.  Potential side effects include allergic reaction, herpes infections, injection site reactions and conjunctivitis (inflammation of the eyes).  The use of Dupixent requires long term medication management, including periodic office visits.  Will recommend at least four injections before deciding if patient would like to continue with Dupixent and start insurance authorization.   Dupixent 300mg /18mL injected SQ into the R ant thigh, and Dupixent 300mg /66mL injected SQ into the  L ant thigh. Patient tolerated injections well. AL, CMA   dupilumab (DUPIXENT) prefilled syringe 300 mg - Trunk and extremities  dupilumab (DUPIXENT) prefilled syringe 300 mg - Trunk and extremities  Other Related Medications Calcipotriene 0.005 % FOAM  Return in about 2 weeks (around 08/31/2020) for Dupixent injection.  Luther Redo, CMA, am acting as scribe for Sarina Ser, MD .  Documentation: I have reviewed the above documentation for accuracy and completeness, and I agree with the above.  Sarina Ser, MD

## 2020-08-17 NOTE — Telephone Encounter (Signed)
Offered pt spot today at 4:30. He is receiving dialysis treatment today and does not typically feel up to doing anything else on these days. I informed him that you would like to see him sooner than 10/27 appt to start the [place]. He is unsure if he wants to try Dupixent and wanted some time to think about it. Pt will call back with decision.

## 2020-08-17 NOTE — Patient Instructions (Signed)
Dupilumab (Dupixent) is a treatment given by injection for adults with moderate-to-severe atopic dermatitis. Goal is control of skin condition, not cure. It is given as 2 injections at the first dose followed by 1 injection ever 2 weeks thereafter.  Potential side effects include allergic reaction, herpes infections, injection site reactions and conjunctivitis (inflammation of the eyes).  The use of Dupixent requires long term medication management, including periodic office visits.

## 2020-08-17 NOTE — Telephone Encounter (Signed)
Pt called back and has decided to come in today at 4:30 to start the [place].

## 2020-08-17 NOTE — Telephone Encounter (Signed)
OK.  Advise him if he does not want to pursue [place] before 10/27, then still keep 10/27 appt.

## 2020-08-18 ENCOUNTER — Encounter: Payer: Self-pay | Admitting: Dermatology

## 2020-08-19 DIAGNOSIS — Z992 Dependence on renal dialysis: Secondary | ICD-10-CM | POA: Diagnosis not present

## 2020-08-19 DIAGNOSIS — N186 End stage renal disease: Secondary | ICD-10-CM | POA: Diagnosis not present

## 2020-08-19 DIAGNOSIS — E039 Hypothyroidism, unspecified: Secondary | ICD-10-CM | POA: Diagnosis not present

## 2020-08-19 DIAGNOSIS — N2581 Secondary hyperparathyroidism of renal origin: Secondary | ICD-10-CM | POA: Diagnosis not present

## 2020-08-19 DIAGNOSIS — D689 Coagulation defect, unspecified: Secondary | ICD-10-CM | POA: Diagnosis not present

## 2020-08-21 DIAGNOSIS — Z992 Dependence on renal dialysis: Secondary | ICD-10-CM | POA: Diagnosis not present

## 2020-08-21 DIAGNOSIS — E039 Hypothyroidism, unspecified: Secondary | ICD-10-CM | POA: Diagnosis not present

## 2020-08-21 DIAGNOSIS — N186 End stage renal disease: Secondary | ICD-10-CM | POA: Diagnosis not present

## 2020-08-21 DIAGNOSIS — D689 Coagulation defect, unspecified: Secondary | ICD-10-CM | POA: Diagnosis not present

## 2020-08-21 DIAGNOSIS — N2581 Secondary hyperparathyroidism of renal origin: Secondary | ICD-10-CM | POA: Diagnosis not present

## 2020-08-24 DIAGNOSIS — Z992 Dependence on renal dialysis: Secondary | ICD-10-CM | POA: Diagnosis not present

## 2020-08-24 DIAGNOSIS — N2581 Secondary hyperparathyroidism of renal origin: Secondary | ICD-10-CM | POA: Diagnosis not present

## 2020-08-24 DIAGNOSIS — D689 Coagulation defect, unspecified: Secondary | ICD-10-CM | POA: Diagnosis not present

## 2020-08-24 DIAGNOSIS — E039 Hypothyroidism, unspecified: Secondary | ICD-10-CM | POA: Diagnosis not present

## 2020-08-24 DIAGNOSIS — N186 End stage renal disease: Secondary | ICD-10-CM | POA: Diagnosis not present

## 2020-08-26 DIAGNOSIS — N2581 Secondary hyperparathyroidism of renal origin: Secondary | ICD-10-CM | POA: Diagnosis not present

## 2020-08-26 DIAGNOSIS — N186 End stage renal disease: Secondary | ICD-10-CM | POA: Diagnosis not present

## 2020-08-26 DIAGNOSIS — Z992 Dependence on renal dialysis: Secondary | ICD-10-CM | POA: Diagnosis not present

## 2020-08-26 DIAGNOSIS — E039 Hypothyroidism, unspecified: Secondary | ICD-10-CM | POA: Diagnosis not present

## 2020-08-26 DIAGNOSIS — D689 Coagulation defect, unspecified: Secondary | ICD-10-CM | POA: Diagnosis not present

## 2020-08-28 DIAGNOSIS — Z992 Dependence on renal dialysis: Secondary | ICD-10-CM | POA: Diagnosis not present

## 2020-08-28 DIAGNOSIS — E039 Hypothyroidism, unspecified: Secondary | ICD-10-CM | POA: Diagnosis not present

## 2020-08-28 DIAGNOSIS — D689 Coagulation defect, unspecified: Secondary | ICD-10-CM | POA: Diagnosis not present

## 2020-08-28 DIAGNOSIS — N186 End stage renal disease: Secondary | ICD-10-CM | POA: Diagnosis not present

## 2020-08-28 DIAGNOSIS — N2581 Secondary hyperparathyroidism of renal origin: Secondary | ICD-10-CM | POA: Diagnosis not present

## 2020-08-31 DIAGNOSIS — N2581 Secondary hyperparathyroidism of renal origin: Secondary | ICD-10-CM | POA: Diagnosis not present

## 2020-08-31 DIAGNOSIS — D689 Coagulation defect, unspecified: Secondary | ICD-10-CM | POA: Diagnosis not present

## 2020-08-31 DIAGNOSIS — Z992 Dependence on renal dialysis: Secondary | ICD-10-CM | POA: Diagnosis not present

## 2020-08-31 DIAGNOSIS — N186 End stage renal disease: Secondary | ICD-10-CM | POA: Diagnosis not present

## 2020-08-31 DIAGNOSIS — E039 Hypothyroidism, unspecified: Secondary | ICD-10-CM | POA: Diagnosis not present

## 2020-09-01 DIAGNOSIS — J449 Chronic obstructive pulmonary disease, unspecified: Secondary | ICD-10-CM | POA: Diagnosis not present

## 2020-09-02 DIAGNOSIS — N2581 Secondary hyperparathyroidism of renal origin: Secondary | ICD-10-CM | POA: Diagnosis not present

## 2020-09-02 DIAGNOSIS — N186 End stage renal disease: Secondary | ICD-10-CM | POA: Diagnosis not present

## 2020-09-02 DIAGNOSIS — D689 Coagulation defect, unspecified: Secondary | ICD-10-CM | POA: Diagnosis not present

## 2020-09-02 DIAGNOSIS — E039 Hypothyroidism, unspecified: Secondary | ICD-10-CM | POA: Diagnosis not present

## 2020-09-02 DIAGNOSIS — Z992 Dependence on renal dialysis: Secondary | ICD-10-CM | POA: Diagnosis not present

## 2020-09-04 DIAGNOSIS — N2581 Secondary hyperparathyroidism of renal origin: Secondary | ICD-10-CM | POA: Diagnosis not present

## 2020-09-04 DIAGNOSIS — N186 End stage renal disease: Secondary | ICD-10-CM | POA: Diagnosis not present

## 2020-09-04 DIAGNOSIS — Z992 Dependence on renal dialysis: Secondary | ICD-10-CM | POA: Diagnosis not present

## 2020-09-04 DIAGNOSIS — D689 Coagulation defect, unspecified: Secondary | ICD-10-CM | POA: Diagnosis not present

## 2020-09-04 DIAGNOSIS — E039 Hypothyroidism, unspecified: Secondary | ICD-10-CM | POA: Diagnosis not present

## 2020-09-06 DIAGNOSIS — J449 Chronic obstructive pulmonary disease, unspecified: Secondary | ICD-10-CM | POA: Diagnosis not present

## 2020-09-06 DIAGNOSIS — C3490 Malignant neoplasm of unspecified part of unspecified bronchus or lung: Secondary | ICD-10-CM | POA: Diagnosis not present

## 2020-09-07 DIAGNOSIS — N2581 Secondary hyperparathyroidism of renal origin: Secondary | ICD-10-CM | POA: Diagnosis not present

## 2020-09-07 DIAGNOSIS — Z992 Dependence on renal dialysis: Secondary | ICD-10-CM | POA: Diagnosis not present

## 2020-09-07 DIAGNOSIS — E039 Hypothyroidism, unspecified: Secondary | ICD-10-CM | POA: Diagnosis not present

## 2020-09-07 DIAGNOSIS — D689 Coagulation defect, unspecified: Secondary | ICD-10-CM | POA: Diagnosis not present

## 2020-09-07 DIAGNOSIS — N186 End stage renal disease: Secondary | ICD-10-CM | POA: Diagnosis not present

## 2020-09-08 ENCOUNTER — Encounter: Payer: Self-pay | Admitting: Dermatology

## 2020-09-08 ENCOUNTER — Ambulatory Visit (INDEPENDENT_AMBULATORY_CARE_PROVIDER_SITE_OTHER): Payer: Medicare Other | Admitting: Dermatology

## 2020-09-08 ENCOUNTER — Other Ambulatory Visit: Payer: Self-pay

## 2020-09-08 DIAGNOSIS — L2081 Atopic neurodermatitis: Secondary | ICD-10-CM | POA: Diagnosis not present

## 2020-09-08 DIAGNOSIS — L111 Transient acantholytic dermatosis [Grover]: Secondary | ICD-10-CM | POA: Diagnosis not present

## 2020-09-08 MED ORDER — DUPILUMAB 300 MG/2ML ~~LOC~~ SOAJ
300.0000 mg | Freq: Once | SUBCUTANEOUS | Status: AC
  Administered 2020-09-08: 300 mg via SUBCUTANEOUS

## 2020-09-08 NOTE — Progress Notes (Signed)
   Follow-Up Visit   Subjective  Jeremy Johnson is a 74 y.o. male who presents for the following: Dermatitis (3 weeks f/u on Grover's disease, pt here for 2nd Dupixent injection ). He states he has had decreased itching since his first Dupixent injection.  He is not sure if the rash has improved but he had less significant rash last time he was seen.  The following portions of the chart were reviewed this encounter and updated as appropriate:  Tobacco  Allergies  Meds  Problems  Med Hx  Surg Hx  Fam Hx     Review of Systems:  No other skin or systemic complaints except as noted in HPI or Assessment and Plan.  Objective  Well appearing patient in no apparent distress; mood and affect are within normal limits.  A focused examination was performed including face,chest,back . Relevant physical exam findings are noted in the Assessment and Plan.  Objective  trunk, exts: Red papules and papulovesicles.    Assessment & Plan  Grover's disease with atopic neurodermatitis and pruritus trunk, exts  Grover's disease -biopsy-proven  With atopic neurodermatitis and pruritus -severe and persistent despite multiple treatment options that he failed.  He continues to have a lot of itch but much better after injection with Dupixent.   Discussed with patient that Dupilumab (Dupixent) is a treatment given by injection for adults with moderate-to-severe atopic dermatitis. Goal is control of skin condition, not cure. It is given as 2 injections at the first dose followed by 1 injection ever 2 weeks thereafter.  Discussed that I cannot guarantee that that his itch and rash will resolve, but there has been nothing that seems to have helped so far and this is his best option.   Potential side effects include allergic reaction, herpes infections, injection site reactions and conjunctivitis (inflammation of the eyes).  The use of Dupixent requires long term medication management, including periodic office  visits.   Will recommend at least four injections before deciding if patient would like to continue with Dupixent and start insurance authorization.    Dupixent 300mg /15mL  injected SQ into the L ant thigh. Patient tolerated injection well  Other Related Medications Calcipotriene 0.005 % FOAM  Return in about 2 weeks (around 09/22/2020) for [place] .  IMarye Round, CMA, am acting as scribe for Sarina Ser, MD .  Documentation: I have reviewed the above documentation for accuracy and completeness, and I agree with the above.  Sarina Ser, MD

## 2020-09-09 ENCOUNTER — Ambulatory Visit (HOSPITAL_COMMUNITY)
Admission: RE | Admit: 2020-09-09 | Discharge: 2020-09-09 | Disposition: A | Discharge status: Home / Self Care | Payer: Medicare Other | Source: Ambulatory Visit | Attending: Nephrology | Admitting: Nephrology

## 2020-09-09 ENCOUNTER — Other Ambulatory Visit (HOSPITAL_COMMUNITY): Payer: Self-pay | Admitting: Nephrology

## 2020-09-09 DIAGNOSIS — E039 Hypothyroidism, unspecified: Secondary | ICD-10-CM | POA: Diagnosis not present

## 2020-09-09 DIAGNOSIS — Z992 Dependence on renal dialysis: Secondary | ICD-10-CM | POA: Diagnosis not present

## 2020-09-09 DIAGNOSIS — J439 Emphysema, unspecified: Secondary | ICD-10-CM | POA: Diagnosis not present

## 2020-09-09 DIAGNOSIS — N186 End stage renal disease: Secondary | ICD-10-CM | POA: Diagnosis not present

## 2020-09-09 DIAGNOSIS — N2581 Secondary hyperparathyroidism of renal origin: Secondary | ICD-10-CM | POA: Diagnosis not present

## 2020-09-09 DIAGNOSIS — R0602 Shortness of breath: Secondary | ICD-10-CM | POA: Insufficient documentation

## 2020-09-09 DIAGNOSIS — J90 Pleural effusion, not elsewhere classified: Secondary | ICD-10-CM | POA: Diagnosis not present

## 2020-09-09 DIAGNOSIS — D689 Coagulation defect, unspecified: Secondary | ICD-10-CM | POA: Diagnosis not present

## 2020-09-09 DIAGNOSIS — J9811 Atelectasis: Secondary | ICD-10-CM | POA: Diagnosis not present

## 2020-09-11 DIAGNOSIS — D689 Coagulation defect, unspecified: Secondary | ICD-10-CM | POA: Diagnosis not present

## 2020-09-11 DIAGNOSIS — N186 End stage renal disease: Secondary | ICD-10-CM | POA: Diagnosis not present

## 2020-09-11 DIAGNOSIS — N2581 Secondary hyperparathyroidism of renal origin: Secondary | ICD-10-CM | POA: Diagnosis not present

## 2020-09-11 DIAGNOSIS — E039 Hypothyroidism, unspecified: Secondary | ICD-10-CM | POA: Diagnosis not present

## 2020-09-11 DIAGNOSIS — Z992 Dependence on renal dialysis: Secondary | ICD-10-CM | POA: Diagnosis not present

## 2020-09-12 DIAGNOSIS — N186 End stage renal disease: Secondary | ICD-10-CM | POA: Diagnosis not present

## 2020-09-12 DIAGNOSIS — I129 Hypertensive chronic kidney disease with stage 1 through stage 4 chronic kidney disease, or unspecified chronic kidney disease: Secondary | ICD-10-CM | POA: Diagnosis not present

## 2020-09-12 DIAGNOSIS — Z992 Dependence on renal dialysis: Secondary | ICD-10-CM | POA: Diagnosis not present

## 2020-09-14 DIAGNOSIS — N2581 Secondary hyperparathyroidism of renal origin: Secondary | ICD-10-CM | POA: Diagnosis not present

## 2020-09-14 DIAGNOSIS — N186 End stage renal disease: Secondary | ICD-10-CM | POA: Diagnosis not present

## 2020-09-14 DIAGNOSIS — D689 Coagulation defect, unspecified: Secondary | ICD-10-CM | POA: Diagnosis not present

## 2020-09-14 DIAGNOSIS — Z992 Dependence on renal dialysis: Secondary | ICD-10-CM | POA: Diagnosis not present

## 2020-09-16 ENCOUNTER — Encounter (HOSPITAL_COMMUNITY): Payer: Self-pay | Admitting: Emergency Medicine

## 2020-09-16 ENCOUNTER — Inpatient Hospital Stay (HOSPITAL_COMMUNITY)
Admission: EM | Admit: 2020-09-16 | Discharge: 2020-09-25 | DRG: 246 | Disposition: A | Discharge status: Home / Self Care | Payer: Medicare Other | Source: Ambulatory Visit | Attending: Internal Medicine | Admitting: Internal Medicine

## 2020-09-16 ENCOUNTER — Emergency Department (HOSPITAL_COMMUNITY): Payer: Medicare Other

## 2020-09-16 ENCOUNTER — Other Ambulatory Visit: Payer: Self-pay

## 2020-09-16 DIAGNOSIS — I2510 Atherosclerotic heart disease of native coronary artery without angina pectoris: Secondary | ICD-10-CM | POA: Diagnosis present

## 2020-09-16 DIAGNOSIS — D631 Anemia in chronic kidney disease: Secondary | ICD-10-CM | POA: Diagnosis present

## 2020-09-16 DIAGNOSIS — Z7901 Long term (current) use of anticoagulants: Secondary | ICD-10-CM

## 2020-09-16 DIAGNOSIS — I483 Typical atrial flutter: Secondary | ICD-10-CM | POA: Diagnosis not present

## 2020-09-16 DIAGNOSIS — I2584 Coronary atherosclerosis due to calcified coronary lesion: Secondary | ICD-10-CM | POA: Diagnosis present

## 2020-09-16 DIAGNOSIS — R54 Age-related physical debility: Secondary | ICD-10-CM | POA: Diagnosis present

## 2020-09-16 DIAGNOSIS — M109 Gout, unspecified: Secondary | ICD-10-CM | POA: Diagnosis not present

## 2020-09-16 DIAGNOSIS — Z79899 Other long term (current) drug therapy: Secondary | ICD-10-CM

## 2020-09-16 DIAGNOSIS — E785 Hyperlipidemia, unspecified: Secondary | ICD-10-CM | POA: Diagnosis not present

## 2020-09-16 DIAGNOSIS — C3490 Malignant neoplasm of unspecified part of unspecified bronchus or lung: Secondary | ICD-10-CM | POA: Diagnosis not present

## 2020-09-16 DIAGNOSIS — J90 Pleural effusion, not elsewhere classified: Secondary | ICD-10-CM | POA: Diagnosis not present

## 2020-09-16 DIAGNOSIS — Z681 Body mass index (BMI) 19 or less, adult: Secondary | ICD-10-CM | POA: Diagnosis not present

## 2020-09-16 DIAGNOSIS — I5021 Acute systolic (congestive) heart failure: Secondary | ICD-10-CM

## 2020-09-16 DIAGNOSIS — Z992 Dependence on renal dialysis: Secondary | ICD-10-CM

## 2020-09-16 DIAGNOSIS — R0602 Shortness of breath: Secondary | ICD-10-CM | POA: Diagnosis not present

## 2020-09-16 DIAGNOSIS — I517 Cardiomegaly: Secondary | ICD-10-CM | POA: Diagnosis not present

## 2020-09-16 DIAGNOSIS — J918 Pleural effusion in other conditions classified elsewhere: Secondary | ICD-10-CM | POA: Diagnosis not present

## 2020-09-16 DIAGNOSIS — I132 Hypertensive heart and chronic kidney disease with heart failure and with stage 5 chronic kidney disease, or end stage renal disease: Principal | ICD-10-CM | POA: Diagnosis present

## 2020-09-16 DIAGNOSIS — I10 Essential (primary) hypertension: Secondary | ICD-10-CM | POA: Diagnosis not present

## 2020-09-16 DIAGNOSIS — Z923 Personal history of irradiation: Secondary | ICD-10-CM

## 2020-09-16 DIAGNOSIS — I4819 Other persistent atrial fibrillation: Secondary | ICD-10-CM | POA: Diagnosis not present

## 2020-09-16 DIAGNOSIS — I5020 Unspecified systolic (congestive) heart failure: Secondary | ICD-10-CM | POA: Diagnosis not present

## 2020-09-16 DIAGNOSIS — J449 Chronic obstructive pulmonary disease, unspecified: Secondary | ICD-10-CM | POA: Diagnosis present

## 2020-09-16 DIAGNOSIS — E039 Hypothyroidism, unspecified: Secondary | ICD-10-CM | POA: Diagnosis present

## 2020-09-16 DIAGNOSIS — Z515 Encounter for palliative care: Secondary | ICD-10-CM

## 2020-09-16 DIAGNOSIS — I429 Cardiomyopathy, unspecified: Secondary | ICD-10-CM | POA: Diagnosis not present

## 2020-09-16 DIAGNOSIS — R748 Abnormal levels of other serum enzymes: Secondary | ICD-10-CM | POA: Diagnosis not present

## 2020-09-16 DIAGNOSIS — J439 Emphysema, unspecified: Secondary | ICD-10-CM | POA: Diagnosis not present

## 2020-09-16 DIAGNOSIS — J9811 Atelectasis: Secondary | ICD-10-CM | POA: Diagnosis present

## 2020-09-16 DIAGNOSIS — R918 Other nonspecific abnormal finding of lung field: Secondary | ICD-10-CM | POA: Diagnosis present

## 2020-09-16 DIAGNOSIS — R64 Cachexia: Secondary | ICD-10-CM | POA: Diagnosis not present

## 2020-09-16 DIAGNOSIS — E8889 Other specified metabolic disorders: Secondary | ICD-10-CM | POA: Diagnosis present

## 2020-09-16 DIAGNOSIS — K5790 Diverticulosis of intestine, part unspecified, without perforation or abscess without bleeding: Secondary | ICD-10-CM | POA: Diagnosis present

## 2020-09-16 DIAGNOSIS — Z7189 Other specified counseling: Secondary | ICD-10-CM

## 2020-09-16 DIAGNOSIS — C3492 Malignant neoplasm of unspecified part of left bronchus or lung: Secondary | ICD-10-CM | POA: Diagnosis not present

## 2020-09-16 DIAGNOSIS — R7989 Other specified abnormal findings of blood chemistry: Secondary | ICD-10-CM | POA: Diagnosis not present

## 2020-09-16 DIAGNOSIS — N186 End stage renal disease: Secondary | ICD-10-CM | POA: Diagnosis not present

## 2020-09-16 DIAGNOSIS — R778 Other specified abnormalities of plasma proteins: Secondary | ICD-10-CM | POA: Diagnosis not present

## 2020-09-16 DIAGNOSIS — I340 Nonrheumatic mitral (valve) insufficiency: Secondary | ICD-10-CM | POA: Diagnosis not present

## 2020-09-16 DIAGNOSIS — I700 Atherosclerosis of aorta: Secondary | ICD-10-CM | POA: Diagnosis not present

## 2020-09-16 DIAGNOSIS — K219 Gastro-esophageal reflux disease without esophagitis: Secondary | ICD-10-CM | POA: Diagnosis present

## 2020-09-16 DIAGNOSIS — Z955 Presence of coronary angioplasty implant and graft: Secondary | ICD-10-CM

## 2020-09-16 DIAGNOSIS — Z87891 Personal history of nicotine dependence: Secondary | ICD-10-CM

## 2020-09-16 DIAGNOSIS — H9190 Unspecified hearing loss, unspecified ear: Secondary | ICD-10-CM | POA: Diagnosis present

## 2020-09-16 DIAGNOSIS — Z8679 Personal history of other diseases of the circulatory system: Secondary | ICD-10-CM

## 2020-09-16 DIAGNOSIS — Z9221 Personal history of antineoplastic chemotherapy: Secondary | ICD-10-CM

## 2020-09-16 DIAGNOSIS — I252 Old myocardial infarction: Secondary | ICD-10-CM

## 2020-09-16 DIAGNOSIS — I5043 Acute on chronic combined systolic (congestive) and diastolic (congestive) heart failure: Secondary | ICD-10-CM | POA: Diagnosis present

## 2020-09-16 DIAGNOSIS — E440 Moderate protein-calorie malnutrition: Secondary | ICD-10-CM | POA: Diagnosis not present

## 2020-09-16 DIAGNOSIS — I5082 Biventricular heart failure: Secondary | ICD-10-CM | POA: Diagnosis not present

## 2020-09-16 DIAGNOSIS — Z823 Family history of stroke: Secondary | ICD-10-CM

## 2020-09-16 DIAGNOSIS — I5041 Acute combined systolic (congestive) and diastolic (congestive) heart failure: Secondary | ICD-10-CM | POA: Diagnosis not present

## 2020-09-16 DIAGNOSIS — I071 Rheumatic tricuspid insufficiency: Secondary | ICD-10-CM | POA: Diagnosis not present

## 2020-09-16 DIAGNOSIS — Z801 Family history of malignant neoplasm of trachea, bronchus and lung: Secondary | ICD-10-CM

## 2020-09-16 DIAGNOSIS — G2581 Restless legs syndrome: Secondary | ICD-10-CM | POA: Diagnosis present

## 2020-09-16 DIAGNOSIS — I480 Paroxysmal atrial fibrillation: Secondary | ICD-10-CM | POA: Diagnosis not present

## 2020-09-16 DIAGNOSIS — E782 Mixed hyperlipidemia: Secondary | ICD-10-CM

## 2020-09-16 DIAGNOSIS — Z85118 Personal history of other malignant neoplasm of bronchus and lung: Secondary | ICD-10-CM

## 2020-09-16 DIAGNOSIS — R9431 Abnormal electrocardiogram [ECG] [EKG]: Secondary | ICD-10-CM | POA: Diagnosis not present

## 2020-09-16 DIAGNOSIS — I716 Thoracoabdominal aortic aneurysm, without rupture: Secondary | ICD-10-CM | POA: Diagnosis present

## 2020-09-16 DIAGNOSIS — I4891 Unspecified atrial fibrillation: Secondary | ICD-10-CM | POA: Diagnosis not present

## 2020-09-16 DIAGNOSIS — E875 Hyperkalemia: Secondary | ICD-10-CM | POA: Diagnosis not present

## 2020-09-16 DIAGNOSIS — R000 Tachycardia, unspecified: Secondary | ICD-10-CM | POA: Diagnosis not present

## 2020-09-16 DIAGNOSIS — Z79890 Hormone replacement therapy (postmenopausal): Secondary | ICD-10-CM

## 2020-09-16 DIAGNOSIS — I509 Heart failure, unspecified: Secondary | ICD-10-CM

## 2020-09-16 DIAGNOSIS — Z20822 Contact with and (suspected) exposure to covid-19: Secondary | ICD-10-CM | POA: Diagnosis present

## 2020-09-16 DIAGNOSIS — Z888 Allergy status to other drugs, medicaments and biological substances status: Secondary | ICD-10-CM

## 2020-09-16 DIAGNOSIS — R1310 Dysphagia, unspecified: Secondary | ICD-10-CM | POA: Diagnosis present

## 2020-09-16 DIAGNOSIS — D689 Coagulation defect, unspecified: Secondary | ICD-10-CM | POA: Diagnosis not present

## 2020-09-16 DIAGNOSIS — Q211 Atrial septal defect: Secondary | ICD-10-CM | POA: Diagnosis not present

## 2020-09-16 DIAGNOSIS — Z96641 Presence of right artificial hip joint: Secondary | ICD-10-CM | POA: Diagnosis present

## 2020-09-16 DIAGNOSIS — I110 Hypertensive heart disease with heart failure: Secondary | ICD-10-CM | POA: Diagnosis not present

## 2020-09-16 DIAGNOSIS — I4892 Unspecified atrial flutter: Secondary | ICD-10-CM | POA: Diagnosis not present

## 2020-09-16 DIAGNOSIS — Z7982 Long term (current) use of aspirin: Secondary | ICD-10-CM

## 2020-09-16 DIAGNOSIS — Z880 Allergy status to penicillin: Secondary | ICD-10-CM

## 2020-09-16 DIAGNOSIS — N2581 Secondary hyperparathyroidism of renal origin: Secondary | ICD-10-CM | POA: Diagnosis not present

## 2020-09-16 LAB — RESPIRATORY PANEL BY RT PCR (FLU A&B, COVID)
Influenza A by PCR: NEGATIVE
Influenza B by PCR: NEGATIVE
SARS Coronavirus 2 by RT PCR: NEGATIVE

## 2020-09-16 LAB — BASIC METABOLIC PANEL
Anion gap: 11 (ref 5–15)
BUN: 20 mg/dL (ref 8–23)
CO2: 29 mmol/L (ref 22–32)
Calcium: 8.7 mg/dL — ABNORMAL LOW (ref 8.9–10.3)
Chloride: 94 mmol/L — ABNORMAL LOW (ref 98–111)
Creatinine, Ser: 3.76 mg/dL — ABNORMAL HIGH (ref 0.61–1.24)
GFR, Estimated: 16 mL/min — ABNORMAL LOW (ref >60)
Glucose, Bld: 118 mg/dL — ABNORMAL HIGH (ref 70–99)
Potassium: 4.1 mmol/L (ref 3.5–5.1)
Sodium: 134 mmol/L — ABNORMAL LOW (ref 135–145)

## 2020-09-16 LAB — CBC
HCT: 26.4 % — ABNORMAL LOW (ref 39.0–52.0)
Hemoglobin: 8.4 g/dL — ABNORMAL LOW (ref 13.0–17.0)
MCH: 31.6 pg (ref 26.0–34.0)
MCHC: 31.8 g/dL (ref 30.0–36.0)
MCV: 99.2 fL (ref 80.0–100.0)
Platelets: 221 10*3/uL (ref 150–400)
RBC: 2.66 MIL/uL — ABNORMAL LOW (ref 4.22–5.81)
RDW: 15.9 % — ABNORMAL HIGH (ref 11.5–15.5)
WBC: 8.5 10*3/uL (ref 4.0–10.5)
nRBC: 0 % (ref 0.0–0.2)

## 2020-09-16 LAB — BRAIN NATRIURETIC PEPTIDE: B Natriuretic Peptide: >4500 pg/mL — ABNORMAL HIGH (ref 0.0–100.0)

## 2020-09-16 LAB — TROPONIN I (HIGH SENSITIVITY)
Troponin I (High Sensitivity): 79 ng/L — ABNORMAL HIGH (ref <18)
Troponin I (High Sensitivity): 78 ng/L — ABNORMAL HIGH (ref <18)

## 2020-09-16 NOTE — H&P (Incomplete)
History and Physical  Jeremy Johnson RAQ:[id number] DOB: [birth year] DOA: 09/16/2020  Referring physician: ***  PCP: [name], MD  Outpatient Specialists: *** Patient coming from: *** & is able to ambulate ***  Chief Complaint: ***   HPI: Jeremy Johnson is a 74 y.o. male with medical history significant for *** (For level 3, the HPI must include 4+ descriptors: Location, Quality, Severity, Duration, Timing, Context, modifying factors, associated signs/symptoms and/or status of 3+ chronic problems.)   ED Course: ***  Review of Systems: Review of systems as noted in the HPI. All other systems reviewed and are negative.   Past Medical History:  Diagnosis Date  . Anemia   . Blood transfusion without reported diagnosis   . CAD (coronary artery disease)    STENT... MID CIRCUMFLEX...1997  . Chronic kidney disease    STAGE 3  . COPD (chronic obstructive pulmonary disease) ([place])   . Degenerative joint disease (DJD) of lumbar spine   . GERD (gastroesophageal reflux disease)   . Gout   . Hyperlipidemia   . Hypertension   . Hypothyroidism   . Incisional hernia    abdomen  . Leukocytosis    CHRONIC MILD  . Myocardial infarction ([place])    1997  . SCL CA dx'd 01/2019   Lung cancer   Past Surgical History:  Procedure Laterality Date  . ABDOMINAL AORTIC ANEURYSM REPAIR  2006  . AV FISTULA PLACEMENT Left 04/25/2019   Procedure: ARTERIOVENOUS (AV) FISTULA CREATION LEFT ARM;  Surgeon: Angelia Mould, MD;  Location: [place];  Service: Vascular;  Laterality: Left;  . AV FISTULA PLACEMENT Left 05/19/2019   Procedure: CONVERSION OF LEFT ARM ARTERIOVENOUS FISTULA TO GRAFT;  Surgeon: Angelia Mould, MD;  Location: [place];  Service: Vascular;  Laterality: Left;  . BIOPSY  09/30/2018   Procedure: BIOPSY;  Surgeon: Danie Binder, MD;  [facility];  Service: Endoscopy;;  ascending colon  . BIOPSY  07/27/2020   Procedure: BIOPSY;  Surgeon: Eloise Harman, DO;   [facility];  Service: Endoscopy;;  gastric  . COLONOSCOPY  2008  . COLONOSCOPY N/A 09/30/2018   External and internal hemorrhoids, six polyps removed, one ascending colon polypoid lesion biopsied. Six simple adenomas and one benign polypoid lesion. Colonoscopy Nov 2022.   [name] COLONOSCOPY WITH PROPOFOL N/A 07/27/2020   Procedure: COLONOSCOPY WITH PROPOFOL;  Surgeon: Eloise Harman, DO;  [facility];  Service: Endoscopy;  Laterality: N/A;  11:15am  . CORONARY ANGIOPLASTY WITH STENT PLACEMENT  1997   MID CIRCUMFLEX  . ESOPHAGOGASTRODUODENOSCOPY (EGD) WITH PROPOFOL N/A 07/27/2020   Procedure: ESOPHAGOGASTRODUODENOSCOPY (EGD) WITH PROPOFOL;  Surgeon: Eloise Harman, DO;  [facility];  Service: Endoscopy;  Laterality: N/A;  . HIP ARTHROPLASTY Right 04/30/2020   Procedure: ARTHROPLASTY  HIP (HEMIARTHROPLASTY);  Surgeon: Altamese Newport, MD;  Location: [place];  Service: Orthopedics;  Laterality: Right;  . IR FLUORO GUIDE CV LINE RIGHT  04/22/2019  . IR FLUORO GUIDE CV LINE RIGHT  05/01/2020  . IR THROMBECTOMY AV FISTULA W/THROMBOLYSIS/PTA INC/SHUNT/IMG LEFT Left 05/03/2020  . IR US GUIDE VASC ACCESS LEFT  05/03/2020  . IR US GUIDE VASC ACCESS RIGHT  04/22/2019  . IR US GUIDE VASC ACCESS RIGHT  05/01/2020  . POLYPECTOMY  09/30/2018   Procedure: POLYPECTOMY;  Surgeon: Danie Binder, MD;  [facility];  Service: Endoscopy;;  colon  . POLYPECTOMY  07/27/2020   Procedure: POLYPECTOMY;  Surgeon: Hurshel Keys  K, DO;  [facility];  Service: Endoscopy;;  . VIDEO BRONCHOSCOPY WITH ENDOBRONCHIAL NAVIGATION N/A 01/27/2019   Procedure: VIDEO BRONCHOSCOPY WITH ENDOBRONCHIAL NAVIGATION;  Surgeon: Grace Isaac, MD;  Location: [place];  Service: Thoracic;  Laterality: N/A;  . VIDEO BRONCHOSCOPY WITH ENDOBRONCHIAL ULTRASOUND N/A 01/27/2019   Procedure: VIDEO BRONCHOSCOPY WITH ENDOBRONCHIAL ULTRASOUND;  Surgeon: Grace Isaac, MD;  Location: [place];   Service: Thoracic;  Laterality: N/A;    Social History:  reports that he quit smoking about 3 years ago. He has a 27.00 pack-year smoking history. He has never used smokeless tobacco. He reports previous alcohol use. He reports that he does not use drugs.   Allergies  Allergen Reactions  . Advair Hfa [Fluticasone-Salmeterol] Other (See Comments)    Developed thrush, although the mouth WAS being rinsed as directed  . Penicillins Rash    Has patient had a PCN reaction causing immediate rash, facial/tongue/throat swelling, SOB or lightheadedness with hypotension: No Has patient had a PCN reaction causing severe rash involving mucus membranes or skin necrosis: No Has patient had a PCN reaction that required hospitalization: No Has patient had a PCN reaction occurring within the last 10 years: No If all of the above answers are "NO", then may proceed with Cephalosporin use.     Family History  Problem Relation Age of Onset  . Stroke Brother   . Lung cancer Sister 21       lung cancer/former  . Colon cancer Neg Hx   . Colon polyps Neg Hx     ***  Prior to Admission medications   Medication Sig Start Date End Date Taking? Authorizing Provider  acetaminophen (TYLENOL) 325 MG tablet Take 2 tablets (650 mg total) by mouth every 6 (six) hours as needed for mild pain (or Fever >/= 101). Patient taking differently: Take 1,300 mg by mouth every 6 (six) hours as needed for mild pain or headache (or Fever >/= 101). Arthritis strength 05/25/19  Yes Swayze, Ava, DO  albuterol (VENTOLIN HFA) 108 (90 Base) MCG/ACT inhaler Inhale 2 puffs into the lungs every 6 (six) hours as needed for wheezing or shortness of breath.  02/02/20  Yes [provider]  allopurinol (ZYLOPRIM) 100 MG tablet Take 1 tablet (100 mg total) by mouth daily. Patient taking differently: Take 100 mg by mouth in the morning.  04/26/19  Yes Mercy Riding, MD  amLODipine (NORVASC) 10 MG tablet Take 10 mg by mouth at bedtime.   08/16/19  Yes [provider]  aspirin EC 81 MG tablet Take 81 mg by mouth at bedtime.   Yes [provider]  B Complex-C-Zn-Folic Acid (DIALYVITE 419-FXTK 15) 0.8 MG TABS Take 1 tablet by mouth daily. 04/22/20  Yes [provider]  Cyanocobalamin (VITAMIN B 12) 500 MCG TABS Take 1,000 mg by mouth daily.   Yes [provider]  Darbepoetin Alfa (ARANESP) 100 MCG/0.5ML SOSY injection Inject 0.5 mLs (100 mcg total) into the vein every Thursday with hemodialysis. 05/01/19  Yes Wendee Beavers T, MD  iron sucrose in sodium chloride 0.9 % 100 mL 50 mg. 04/06/20 04/05/21 Yes [provider]  levothyroxine (SYNTHROID, LEVOTHROID) 175 MCG tablet Take 175 mcg by mouth daily before breakfast.    Yes [provider]  lidocaine-prilocaine (EMLA) cream SMARTSIG:1 Topical Every Night 08/28/20  Yes [provider]  Methoxy PEG-Epoetin Beta (MIRCERA IJ) Inject 30 mg into the vein every 28 (twenty-eight) days.  03/23/20 03/22/21 Yes [provider]  metoprolol tartrate (LOPRESSOR) 50 MG tablet Take 25 mg by mouth 2 (two) times daily.    Yes [provider]  omeprazole (PRILOSEC) 40 MG capsule Take 1 capsule (40 mg total) by mouth in the morning and at bedtime. 07/27/20 09/16/20 Yes Carver, Charles K, DO  rosuvastatin (CRESTOR) 10 MG tablet Take 10 mg by mouth at bedtime. 01/30/20  Yes [provider]  Calcipotriene 0.005 % FOAM Apply to to body bid Patient not taking: Reported on 09/16/2020 08/05/20   Ralene Bathe, MD  pramipexole (MIRAPEX) 0.25 MG tablet Take 0.25 mg by mouth at bedtime. Patient not taking: Reported on 09/16/2020 03/31/20   [provider]  triamcinolone cream (KENALOG) 0.1 % Apply to aa's rash BID until clear then PRN thereafter. Avoid face, groin, and axilla. Patient not taking: Reported on 09/16/2020 06/09/20   Ralene Bathe, MD    Physical Exam: BP 106/75   Pulse (!) 107   Temp 97.7 F (36.5 C) (Oral)    Resp (!) 33   SpO2 94%   . General: 74 y.o. year-old male well developed well nourished in no acute distress.  Alert and oriented x3. . Cardiovascular: Regular rate and rhythm with no rubs or gallops.  No thyromegaly or JVD noted.  No lower extremity edema. 2/4 pulses in all 4 extremities. [name] Respiratory: Clear to auscultation with no wheezes or rales. Good inspiratory effort. . Abdomen: Soft nontender nondistended with normal bowel sounds x4 quadrants. . Muskuloskeletal: No cyanosis, clubbing or edema noted bilaterally . Neuro: CN II-XII intact, strength, sensation, reflexes . Skin: No ulcerative lesions noted or rashes . Psychiatry: Judgement and insight appear normal. Mood is appropriate for condition and setting          Labs on Admission:  Basic Metabolic Panel: Recent Labs  Lab 09/16/20 1636  NA 134*  K 4.1  CL 94*  CO2 29  GLUCOSE 118*  BUN 20  CREATININE 3.76*  CALCIUM 8.7*   Liver Function Tests: No results for input(s): AST, ALT, ALKPHOS, BILITOT, PROT, ALBUMIN in the last 168 hours. No results for input(s): LIPASE, AMYLASE in the last 168 hours. No results for input(s): AMMONIA in the last 168 hours. CBC: Recent Labs  Lab 09/16/20 1636  WBC 8.5  HGB 8.4*  HCT 26.4*  MCV 99.2  PLT 221   Cardiac Enzymes: No results for input(s): CKTOTAL, CKMB, CKMBINDEX, TROPONINI in the last 168 hours.  BNP (last 3 results) Recent Labs    09/16/20 1636  BNP >4,500.0*    ProBNP (last 3 results) No results for input(s): PROBNP in the last 8760 hours.  CBG: No results for input(s): GLUCAP in the last 168 hours.  Radiological Exams on Admission: CT Chest Wo Contrast  Result Date: 09/16/2020 CLINICAL DATA:  COPD exacerbation. Intermittent shortness of breath for 2 weeks. Patient with history of small cell lung cancer. EXAM: CT CHEST WITHOUT CONTRAST TECHNIQUE: Multidetector CT imaging of the chest was performed following the standard protocol without IV contrast.  COMPARISON:  Radiograph earlier today. Most recent chest CT 07/20/2020 FINDINGS: Cardiovascular: Chronic cardiomegaly. Coronary artery calcifications. No pericardial effusion. There is diffuse aortic atherosclerosis and tortuosity. Focal dilatation of the proximal transverse aorta measuring 4.6 cm, unchanged. Focal dilatation at the diaphragmatic hiatus of 4.1 cm unchanged. There is no periaortic stranding. Mediastinum/Nodes: Paucity of mediastinal fat lack of IV contrast limits detailed assessment. There are multiple small mediastinal nodes are unchanged from prior exam and not enlarged by size criteria. Post  treatment changes in the left hilar region, grossly stable. Hilar assessment limited in the absence of IV contrast. No esophageal wall thickening. No evidence of thyroid nodule. Lungs/Pleura: Advanced emphysema. Left paramediastinal post treatment related changes with volume loss and ill-defined opacity, stable. Unchanged partially loculated left pleural effusion. Multiple pulmonary nodules. Right upper lobe nodule measures 2 mm, series 4, image 31, previously 4 mm. Subpleural right upper lobe pulmonary nodule measures 7 mm, series 4, image 37, unchanged or slightly decreased. Continued decreased size of a linear subpleural opacity in the left lower lobe, series 4, image 79, with no definite AP component to measure, 10 mm transverse, previously 12 mm. Chronic fissural thickening in the right minor fissure, unchanged. There is no new pulmonary nodule. No evidence of acute airspace disease. No findings of pulmonary edema. Small right pleural effusion on prior has diminished, minimal pleural thickening inferiorly. No endobronchial lesion. Upper Abdomen: No acute findings. Musculoskeletal: There are no acute or suspicious osseous abnormalities. No evidence of focal bone lesion. IMPRESSION: 1. No acute findings. 2. Emphysema with post treatment related changes in the left lung, not significantly changed from  September 2021 chest CT. 3. Stable small left pleural effusion that is partially loculated. Previous right pleural effusion is diminished. 4. Multiple pulmonary nodules, stable or slightly decreased in size from prior exam. No new pulmonary nodule or mass. 5. Stable cardiomegaly. Stable aortic atherosclerosis with proximal transverse aortic aneurysm of 4.6 cm. No evidence of acute aortic abnormality or inflammation. Recommend semiannual imaging with CTA or MRA. Aortic Atherosclerosis (ICD10-I70.0) and Emphysema (ICD10-J43.9). Electronically Signed   By: Keith Rake M.D.   On: 09/16/2020 18:54   DG Chest Portable 1 View  Result Date: 09/16/2020 CLINICAL DATA:  74 year old male with shortness of breath for 2 weeks. Abnormal EKG. EXAM: PORTABLE CHEST 1 VIEW COMPARISON:  Chest radiographs 09/09/2020 and earlier - including chest CT 07/20/2020. FINDINGS: Portable AP upright view at 1645 hours. Stable cardiomegaly and mediastinal contours. Continued dense retrocardiac opacification, left pleural effusion demonstrated in September. Superimposed chronic left perihilar radiation changes to the lung. Underlying bilateral chronic increased interstitial markings, emphysema demonstrated on prior CT. No overt edema. No pneumothorax. Stable visualized osseous structures. IMPRESSION: 1. Stable ventilation since 09/09/2020. 2. Continued dense left lung base opacity likely representing a combination of pleural effusion and atelectasis. 3. Chronic cardiomegaly, emphysema and radiation pulmonary changes. Electronically Signed   By: Genevie Ann M.D.   On: 09/16/2020 17:21    EKG: I independently viewed the EKG done and my findings are as followed: ***   Assessment/Plan Present on Admission: **None**  Active Problems:   Acute exacerbation of CHF (congestive heart failure) (HCC)   DVT prophylaxis: ***   Code Status: ***   Family Communication: ***   Disposition Plan: ***   Consults called: ***   Admission  status: ***     Bernadette Hoit MD [facility] Pager [phone]  If 7PM-7AM, please contact night-coverage [email or web address] Password [email or web address]  09/16/2020, 9:59 PM

## 2020-09-16 NOTE — ED Provider Notes (Signed)
[facility] EMERGENCY DEPARTMENT Provider Note   CSN: [id number] Arrival date & time: 09/16/20  1605     History Chief Complaint  Patient presents with  . Shortness of Breath    Jeremy Johnson is a 74 y.o. male.  Patient complains of shortness of breath for a week.  He has been seen by his family doctor and was tachycardic.  Patient had no fevers no chills  The history is provided by the patient. No language interpreter was used.  Shortness of Breath Severity:  Moderate Onset quality:  Sudden Timing:  Constant Progression:  Worsening Chronicity:  Recurrent Context: activity   Relieved by:  Nothing Worsened by:  Nothing Associated symptoms: no abdominal pain, no chest pain, no cough, no headaches and no rash        Past Medical History:  Diagnosis Date  . Anemia   . Blood transfusion without reported diagnosis   . CAD (coronary artery disease)    STENT... MID CIRCUMFLEX...1997  . Chronic kidney disease    STAGE 3  . COPD (chronic obstructive pulmonary disease) ([place])   . Degenerative joint disease (DJD) of lumbar spine   . GERD (gastroesophageal reflux disease)   . Gout   . Hyperlipidemia   . Hypertension   . Hypothyroidism   . Incisional hernia    abdomen  . Leukocytosis    CHRONIC MILD  . Myocardial infarction ([place])    1997  . SCL CA dx'd 01/2019   Lung cancer    Patient Active Problem List   Diagnosis Date Noted  . Loss of weight 06/09/2020  . Early satiety 06/09/2020  . Closed displaced fracture of right femoral neck with delayed healing 04/29/2020  . Closed displaced fracture of right femoral neck ([place]) 04/29/2020  . Acute respiratory failure with hypoxia ([place]) 04/06/2020  . Acute and chronic respiratory failure with hypoxia ([place]) 04/05/2020  . Chronic obstructive pulmonary disease/emphysema   . Anaphylactic reaction due to adverse effect of correct drug or medicament properly administered, initial encounter 07/30/2019  . Hypocalcemia 05/31/2019    . Other disorders of phosphorus metabolism 05/31/2019  . Pancytopenia ([place]) 05/22/2019  . ESRD (end stage renal disease) on dialysis ([place]) 05/22/2019  . Unspecified protein-calorie malnutrition ([place]) 05/02/2019  . Coagulation defect, unspecified ([place]) 04/26/2019  . Diarrhea, unspecified 04/26/2019  . Dyspnea, unspecified 04/26/2019  . Hypokalemia 04/26/2019  . Pain, unspecified 04/26/2019  . Pruritus, unspecified 04/26/2019  . Secondary hyperparathyroidism of renal origin ([place]) 04/26/2019  . Encounter for immunization 04/26/2019  . Incisional hernia without obstruction or gangrene 04/25/2019  . Malignant neoplasm of unspecified part of unspecified bronchus or lung ([place]) 04/25/2019  . Other specified degenerative diseases of nervous system ([place]) 04/25/2019  . Anemia in chronic kidney disease 04/25/2019  . Atherosclerotic heart disease of native coronary artery without angina pectoris 04/25/2019  . Gastro-esophageal reflux disease without esophagitis 04/25/2019  . Hypothyroidism, unspecified 04/25/2019  . Acute renal failure superimposed on stage 3 chronic kidney disease ([place]) 04/16/2019  . Anemia in chronic kidney disease (CKD) 04/14/2019  . Small cell lung cancer ([place]) 02/06/2019  . Encounter for antineoplastic chemotherapy 02/06/2019  . Goals of care, counseling/discussion 02/06/2019  . Special screening for malignant neoplasms, colon   . GERD (gastroesophageal reflux disease)   . Hyperlipidemia   . Gout   . Hypothyroidism   . Degenerative joint disease (DJD) of lumbar spine   . HYPOTHYROIDISM 02/02/2010  . HLD (hyperlipidemia) 02/02/2010  . Essential hypertension 02/02/2010  . GERD  02/02/2010  . ABDOMINAL AORTIC ANEURYSM REPAIR, HX OF 02/02/2010    Past Surgical History:  Procedure Laterality Date  . ABDOMINAL AORTIC ANEURYSM REPAIR  2006  . AV FISTULA PLACEMENT Left 04/25/2019   Procedure: ARTERIOVENOUS (AV) FISTULA CREATION LEFT ARM;  Surgeon: Angelia Mould, MD;   Location: [facility];  Service: Vascular;  Laterality: Left;  . AV FISTULA PLACEMENT Left 05/19/2019   Procedure: CONVERSION OF LEFT ARM ARTERIOVENOUS FISTULA TO GRAFT;  Surgeon: Angelia Mould, MD;  Location: [place];  Service: Vascular;  Laterality: Left;  . BIOPSY  09/30/2018   Procedure: BIOPSY;  Surgeon: Danie Binder, MD;  [facility];  Service: Endoscopy;;  ascending colon  . BIOPSY  07/27/2020   Procedure: BIOPSY;  Surgeon: Eloise Harman, DO;  [facility];  Service: Endoscopy;;  gastric  . COLONOSCOPY  2008  . COLONOSCOPY N/A 09/30/2018   External and internal hemorrhoids, six polyps removed, one ascending colon polypoid lesion biopsied. Six simple adenomas and one benign polypoid lesion. Colonoscopy Nov 2022.   [name] COLONOSCOPY WITH PROPOFOL N/A 07/27/2020   Procedure: COLONOSCOPY WITH PROPOFOL;  Surgeon: Eloise Harman, DO;  [facility];  Service: Endoscopy;  Laterality: N/A;  11:15am  . CORONARY ANGIOPLASTY WITH STENT PLACEMENT  1997   MID CIRCUMFLEX  . ESOPHAGOGASTRODUODENOSCOPY (EGD) WITH PROPOFOL N/A 07/27/2020   Procedure: ESOPHAGOGASTRODUODENOSCOPY (EGD) WITH PROPOFOL;  Surgeon: Eloise Harman, DO;  [facility];  Service: Endoscopy;  Laterality: N/A;  . HIP ARTHROPLASTY Right 04/30/2020   Procedure: ARTHROPLASTY  HIP (HEMIARTHROPLASTY);  Surgeon: Altamese Reeds, MD;  Location: [facility];  Service: Orthopedics;  Laterality: Right;  . IR FLUORO GUIDE CV LINE RIGHT  04/22/2019  . IR FLUORO GUIDE CV LINE RIGHT  05/01/2020  . IR THROMBECTOMY AV FISTULA W/THROMBOLYSIS/PTA INC/SHUNT/IMG LEFT Left 05/03/2020  . IR US GUIDE VASC ACCESS LEFT  05/03/2020  . IR US GUIDE VASC ACCESS RIGHT  04/22/2019  . IR US GUIDE VASC ACCESS RIGHT  05/01/2020  . POLYPECTOMY  09/30/2018   Procedure: POLYPECTOMY;  Surgeon: Danie Binder, MD;  [facility];  Service: Endoscopy;;  colon  . POLYPECTOMY  07/27/2020   Procedure: POLYPECTOMY;  Surgeon:  Eloise Harman, DO;  [facility];  Service: Endoscopy;;  . VIDEO BRONCHOSCOPY WITH ENDOBRONCHIAL NAVIGATION N/A 01/27/2019   Procedure: VIDEO BRONCHOSCOPY WITH ENDOBRONCHIAL NAVIGATION;  Surgeon: Grace Isaac, MD;  Location: [facility];  Service: Thoracic;  Laterality: N/A;  . VIDEO BRONCHOSCOPY WITH ENDOBRONCHIAL ULTRASOUND N/A 01/27/2019   Procedure: VIDEO BRONCHOSCOPY WITH ENDOBRONCHIAL ULTRASOUND;  Surgeon: Grace Isaac, MD;  Location: [facility];  Service: Thoracic;  Laterality: N/A;       Family History  Problem Relation Age of Onset  . Stroke Brother   . Lung cancer Sister 59       lung cancer/former  . Colon cancer Neg Hx   . Colon polyps Neg Hx     Social History   Tobacco Use  . Smoking status: Former Smoker    Packs/day: 0.50    Years: 54.00    Pack years: 27.00    Quit date: 09/17/2017    Years since quitting: 3.0  . Smokeless tobacco: Never Used  Vaping Use  . Vaping Use: Never used  Substance Use Topics  . Alcohol use: Not Currently    Comment: occasional  . Drug use: No    Home Medications Prior to Admission medications  Medication Sig Start Date End Date Taking? Authorizing Provider  acetaminophen (TYLENOL) 325 MG tablet Take 2 tablets (650 mg total) by mouth every 6 (six) hours as needed for mild pain (or Fever >/= 101). Patient taking differently: Take 1,300 mg by mouth every 6 (six) hours as needed for mild pain or headache (or Fever >/= 101). Arthritis strength 05/25/19  Yes Swayze, Ava, DO  albuterol (VENTOLIN HFA) 108 (90 Base) MCG/ACT inhaler Inhale 2 puffs into the lungs every 6 (six) hours as needed for wheezing or shortness of breath.  02/02/20  Yes [provider]  allopurinol (ZYLOPRIM) 100 MG tablet Take 1 tablet (100 mg total) by mouth daily. Patient taking differently: Take 100 mg by mouth in the morning.  04/26/19  Yes Mercy Riding, MD  amLODipine (NORVASC) 10 MG tablet Take 10 mg by mouth at bedtime.  08/16/19  Yes  [provider]  aspirin EC 81 MG tablet Take 81 mg by mouth at bedtime.   Yes [provider]  B Complex-C-Zn-Folic Acid (DIALYVITE 850-YDXA 15) 0.8 MG TABS Take 1 tablet by mouth daily. 04/22/20  Yes [provider]  Cyanocobalamin (VITAMIN B 12) 500 MCG TABS Take 1,000 mg by mouth daily.   Yes [provider]  Darbepoetin Alfa (ARANESP) 100 MCG/0.5ML SOSY injection Inject 0.5 mLs (100 mcg total) into the vein every Thursday with hemodialysis. 05/01/19  Yes Wendee Beavers T, MD  iron sucrose in sodium chloride 0.9 % 100 mL 50 mg. 04/06/20 04/05/21 Yes [provider]  levothyroxine (SYNTHROID, LEVOTHROID) 175 MCG tablet Take 175 mcg by mouth daily before breakfast.    Yes [provider]  lidocaine-prilocaine (EMLA) cream SMARTSIG:1 Topical Every Night 08/28/20  Yes [provider]  Methoxy PEG-Epoetin Beta (MIRCERA IJ) Inject 30 mg into the vein every 28 (twenty-eight) days.  03/23/20 03/22/21 Yes [provider]  metoprolol tartrate (LOPRESSOR) 50 MG tablet Take 25 mg by mouth 2 (two) times daily.    Yes [provider]  omeprazole (PRILOSEC) 40 MG capsule Take 1 capsule (40 mg total) by mouth in the morning and at bedtime. 07/27/20 09/16/20 Yes Carver, Charles K, DO  rosuvastatin (CRESTOR) 10 MG tablet Take 10 mg by mouth at bedtime. 01/30/20  Yes [provider]  Calcipotriene 0.005 % FOAM Apply to to body bid Patient not taking: Reported on 09/16/2020 08/05/20   Ralene Bathe, MD  pramipexole (MIRAPEX) 0.25 MG tablet Take 0.25 mg by mouth at bedtime. Patient not taking: Reported on 09/16/2020 03/31/20   [provider]  triamcinolone cream (KENALOG) 0.1 % Apply to aa's rash BID until clear then PRN thereafter. Avoid face, groin, and axilla. Patient not taking: Reported on 09/16/2020 06/09/20   Ralene Bathe, MD    Allergies    Advair hfa [fluticasone-salmeterol] and Penicillins  Review of Systems     Review of Systems  Constitutional: Negative for appetite change and fatigue.  HENT: Negative for congestion, ear discharge and sinus pressure.   Eyes: Negative for discharge.  Respiratory: Positive for shortness of breath. Negative for cough.   Cardiovascular: Negative for chest pain.  Gastrointestinal: Negative for abdominal pain and diarrhea.  Genitourinary: Negative for frequency and hematuria.  Musculoskeletal: Negative for back pain.  Skin: Negative for rash.  Neurological: Negative for seizures and headaches.  Psychiatric/Behavioral: Negative for hallucinations.    Physical Exam Updated Vital Signs BP 106/75   Pulse (!) 107   Temp 97.7 F (36.5 C) (Oral)  Resp (!) 33   SpO2 94%   Physical Exam Vitals and nursing note reviewed.  Constitutional:      Appearance: He is well-developed.  HENT:     Head: Normocephalic.     Nose: Nose normal.  Eyes:     General: No scleral icterus.    Conjunctiva/sclera: Conjunctivae normal.  Neck:     Thyroid: No thyromegaly.  Cardiovascular:     Rate and Rhythm: Normal rate and regular rhythm.     Heart sounds: No murmur heard.  No friction rub. No gallop.   Pulmonary:     Breath sounds: No stridor. No wheezing or rales.  Chest:     Chest wall: No tenderness.  Abdominal:     General: There is no distension.     Tenderness: There is no abdominal tenderness. There is no rebound.  Musculoskeletal:        General: Normal range of motion.     Cervical back: Neck supple.  Lymphadenopathy:     Cervical: No cervical adenopathy.  Skin:    Findings: No erythema or rash.  Neurological:     Mental Status: He is alert and oriented to person, place, and time.     Motor: No abnormal muscle tone.     Coordination: Coordination normal.  Psychiatric:        Behavior: Behavior normal.     ED Results / Procedures / Treatments   Labs (all labs ordered are listed, but only abnormal results are displayed) Labs Reviewed  CBC - Abnormal;  Notable for the following components:      Result Value   RBC 2.66 (*)    Hemoglobin 8.4 (*)    HCT 26.4 (*)    RDW 15.9 (*)    All other components within normal limits  BASIC METABOLIC PANEL - Abnormal; Notable for the following components:   Sodium 134 (*)    [place] 94 (*)    Glucose, Bld 118 (*)    Creatinine, Ser 3.76 (*)    Calcium 8.7 (*)    GFR, Estimated 16 (*)    All other components within normal limits  BRAIN NATRIURETIC PEPTIDE - Abnormal; Notable for the following components:   B Natriuretic Peptide >4,500.0 (*)    All other components within normal limits  TROPONIN I (HIGH SENSITIVITY) - Abnormal; Notable for the following components:   Troponin I (High Sensitivity) 79 (*)    All other components within normal limits  TROPONIN I (HIGH SENSITIVITY) - Abnormal; Notable for the following components:   Troponin I (High Sensitivity) 78 (*)    All other components within normal limits  RESPIRATORY PANEL BY RT PCR (FLU A&B, COVID)    EKG EKG Interpretation  Date/Time:  Thursday September 16 2020 16:15:34 EDT Ventricular Rate:  113 PR Interval:  188 QRS Duration: 118 QT Interval:  354 QTC Calculation: 485 R Axis:   49 Text Interpretation: Sinus tachycardia Left ventricular hypertrophy with QRS widening and repolarization abnormality ( Sokolow-Lyon , Cornell product ) Abnormal ECG Since last tracing rate faster Otherwise no significant change Confirmed by Daleen Bo [phone]) on 09/16/2020 4:34:34 PM Also confirmed by Milton Ferguson [phone])  on 09/16/2020 5:04:52 PM   Radiology CT Chest Wo Contrast  Result Date: 09/16/2020 CLINICAL DATA:  COPD exacerbation. Intermittent shortness of breath for 2 weeks. Patient with history of small cell lung cancer. EXAM: CT CHEST WITHOUT CONTRAST TECHNIQUE: Multidetector CT imaging of the chest was performed following the standard protocol without IV  contrast. COMPARISON:  Radiograph earlier today. Most recent chest CT 07/20/2020  FINDINGS: Cardiovascular: Chronic cardiomegaly. Coronary artery calcifications. No pericardial effusion. There is diffuse aortic atherosclerosis and tortuosity. Focal dilatation of the proximal transverse aorta measuring 4.6 cm, unchanged. Focal dilatation at the diaphragmatic hiatus of 4.1 cm unchanged. There is no periaortic stranding. Mediastinum/Nodes: Paucity of mediastinal fat lack of IV contrast limits detailed assessment. There are multiple small mediastinal nodes are unchanged from prior exam and not enlarged by size criteria. Post treatment changes in the left hilar region, grossly stable. Hilar assessment limited in the absence of IV contrast. No esophageal wall thickening. No evidence of thyroid nodule. Lungs/Pleura: Advanced emphysema. Left paramediastinal post treatment related changes with volume loss and ill-defined opacity, stable. Unchanged partially loculated left pleural effusion. Multiple pulmonary nodules. Right upper lobe nodule measures 2 mm, series 4, image 31, previously 4 mm. Subpleural right upper lobe pulmonary nodule measures 7 mm, series 4, image 37, unchanged or slightly decreased. Continued decreased size of a linear subpleural opacity in the left lower lobe, series 4, image 79, with no definite AP component to measure, 10 mm transverse, previously 12 mm. Chronic fissural thickening in the right minor fissure, unchanged. There is no new pulmonary nodule. No evidence of acute airspace disease. No findings of pulmonary edema. Small right pleural effusion on prior has diminished, minimal pleural thickening inferiorly. No endobronchial lesion. Upper Abdomen: No acute findings. Musculoskeletal: There are no acute or suspicious osseous abnormalities. No evidence of focal bone lesion. IMPRESSION: 1. No acute findings. 2. Emphysema with post treatment related changes in the left lung, not significantly changed from September 2021 chest CT. 3. Stable small left pleural effusion that is  partially loculated. Previous right pleural effusion is diminished. 4. Multiple pulmonary nodules, stable or slightly decreased in size from prior exam. No new pulmonary nodule or mass. 5. Stable cardiomegaly. Stable aortic atherosclerosis with proximal transverse aortic aneurysm of 4.6 cm. No evidence of acute aortic abnormality or inflammation. Recommend semiannual imaging with CTA or MRA. Aortic Atherosclerosis (ICD10-I70.0) and Emphysema (ICD10-J43.9). Electronically Signed   By: Keith Rake M.D.   On: 09/16/2020 18:54   DG Chest Portable 1 View  Result Date: 09/16/2020 CLINICAL DATA:  74 year old male with shortness of breath for 2 weeks. Abnormal EKG. EXAM: PORTABLE CHEST 1 VIEW COMPARISON:  Chest radiographs 09/09/2020 and earlier - including chest CT 07/20/2020. FINDINGS: Portable AP upright view at 1645 hours. Stable cardiomegaly and mediastinal contours. Continued dense retrocardiac opacification, left pleural effusion demonstrated in September. Superimposed chronic left perihilar radiation changes to the lung. Underlying bilateral chronic increased interstitial markings, emphysema demonstrated on prior CT. No overt edema. No pneumothorax. Stable visualized osseous structures. IMPRESSION: 1. Stable ventilation since 09/09/2020. 2. Continued dense left lung base opacity likely representing a combination of pleural effusion and atelectasis. 3. Chronic cardiomegaly, emphysema and radiation pulmonary changes. Electronically Signed   By: Genevie Ann M.D.   On: 09/16/2020 17:21    Procedures Procedures (including critical care time)  Medications Ordered in ED Medications - No data to display  ED Course  I have reviewed the triage vital signs and the nursing notes.  Pertinent labs & imaging results that were available during my care of the patient were reviewed by me and considered in my medical decision making (see chart for details). I spoke with cardiology about the patient's congestive heart  failure and it was felt the patient could state at any [facility] and see cardiology in the morning along  with nephrology   MDM Rules/Calculators/A&P                          Patient with congestive heart failure.  He will be admitted and seen by cardiology and nephrology     This patient presents to the ED for concern of shortness of breath, this involves an extensive number of treatment options, and is a complaint that carries with it a high risk of complications and morbidity.  The differential diagnosis includes just heart failure   Lab Tests:   I Ordered, reviewed, and interpreted labs, which included CBC and chemistries that showed kidney failure along with elevated liver studies anemia and elevated BNP  Medicines ordered:     Imaging Studies ordered:   I ordered imaging studies which included chest x-ray  I independently visualized and interpreted imaging which showed pleural effusion  Additional history obtained:   Additional history obtained from records  Previous records obtained and reviewed.  Consultations Obtained:   I consulted hospitalist and discussed lab and imaging findings  Reevaluation:  After the interventions stated above, I reevaluated the patient and found no change  Critical Interventions:  .   Final Clinical Impression(s) / ED Diagnoses Final diagnoses:  Systolic congestive heart failure, unspecified HF chronicity ([place])    Rx / DC Orders ED Discharge Orders    None       Milton Ferguson, MD 09/19/20 1011

## 2020-09-16 NOTE — ED Triage Notes (Signed)
Pt sent from Dr. Beverlee Nims office with C/O SHOB x 2 weeks and abnormal EKG. Pt denies chest pain. Pt wears oxygen at night time.

## 2020-09-17 ENCOUNTER — Inpatient Hospital Stay (HOSPITAL_COMMUNITY): Payer: Medicare Other

## 2020-09-17 DIAGNOSIS — I5021 Acute systolic (congestive) heart failure: Secondary | ICD-10-CM | POA: Diagnosis not present

## 2020-09-17 DIAGNOSIS — R7989 Other specified abnormal findings of blood chemistry: Secondary | ICD-10-CM

## 2020-09-17 DIAGNOSIS — R748 Abnormal levels of other serum enzymes: Secondary | ICD-10-CM

## 2020-09-17 DIAGNOSIS — I10 Essential (primary) hypertension: Secondary | ICD-10-CM | POA: Diagnosis not present

## 2020-09-17 DIAGNOSIS — R000 Tachycardia, unspecified: Secondary | ICD-10-CM | POA: Diagnosis not present

## 2020-09-17 DIAGNOSIS — R778 Other specified abnormalities of plasma proteins: Secondary | ICD-10-CM

## 2020-09-17 DIAGNOSIS — G2581 Restless legs syndrome: Secondary | ICD-10-CM

## 2020-09-17 LAB — COMPREHENSIVE METABOLIC PANEL
ALT: 196 U/L — ABNORMAL HIGH (ref 0–44)
AST: 346 U/L — ABNORMAL HIGH (ref 15–41)
Albumin: 2.7 g/dL — ABNORMAL LOW (ref 3.5–5.0)
Alkaline Phosphatase: 107 U/L (ref 38–126)
Anion gap: 13 (ref 5–15)
BUN: 30 mg/dL — ABNORMAL HIGH (ref 8–23)
CO2: 26 mmol/L (ref 22–32)
Calcium: 8.6 mg/dL — ABNORMAL LOW (ref 8.9–10.3)
Chloride: 95 mmol/L — ABNORMAL LOW (ref 98–111)
Creatinine, Ser: 4.96 mg/dL — ABNORMAL HIGH (ref 0.61–1.24)
GFR, Estimated: 12 mL/min — ABNORMAL LOW (ref >60)
Glucose, Bld: 88 mg/dL (ref 70–99)
Potassium: 4.7 mmol/L (ref 3.5–5.1)
Sodium: 134 mmol/L — ABNORMAL LOW (ref 135–145)
Total Bilirubin: 0.7 mg/dL (ref 0.3–1.2)
Total Protein: 7.2 g/dL (ref 6.5–8.1)

## 2020-09-17 LAB — ECHOCARDIOGRAM COMPLETE
AR max vel: 1.66 cm2
AV Area VTI: 1.69 cm2
AV Area mean vel: 1.74 cm2
AV Mean grad: 5.7 mmHg
AV Peak grad: 13.1 mmHg
Ao pk vel: 1.81 m/s
Area-P 1/2: 4.8 cm2
MV M vel: 3.85 m/s
MV Peak grad: 59.3 mmHg
S' Lateral: 5.91 cm

## 2020-09-17 LAB — TSH: TSH: 0.977 u[IU]/mL (ref 0.350–4.500)

## 2020-09-17 LAB — CBC
HCT: 23.6 % — ABNORMAL LOW (ref 39.0–52.0)
Hemoglobin: 7.6 g/dL — ABNORMAL LOW (ref 13.0–17.0)
MCH: 31.8 pg (ref 26.0–34.0)
MCHC: 32.2 g/dL (ref 30.0–36.0)
MCV: 98.7 fL (ref 80.0–100.0)
Platelets: 183 10*3/uL (ref 150–400)
RBC: 2.39 MIL/uL — ABNORMAL LOW (ref 4.22–5.81)
RDW: 15.7 % — ABNORMAL HIGH (ref 11.5–15.5)
WBC: 7.3 10*3/uL (ref 4.0–10.5)
nRBC: 0 % (ref 0.0–0.2)

## 2020-09-17 LAB — PHOSPHORUS: Phosphorus: 4.6 mg/dL (ref 2.5–4.6)

## 2020-09-17 LAB — APTT: aPTT: 34 seconds (ref 24–36)

## 2020-09-17 LAB — PROTIME-INR
INR: 1.3 — ABNORMAL HIGH (ref 0.8–1.2)
Prothrombin Time: 15.4 seconds — ABNORMAL HIGH (ref 11.4–15.2)

## 2020-09-17 LAB — MAGNESIUM: Magnesium: 2 mg/dL (ref 1.7–2.4)

## 2020-09-17 MED ORDER — METOPROLOL SUCCINATE ER 25 MG PO TB24
25.0000 mg | ORAL_TABLET | Freq: Every day | ORAL | Status: DC
  Administered 2020-09-18 – 2020-09-22 (×4): 25 mg via ORAL
  Filled 2020-09-17 (×5): qty 1

## 2020-09-17 MED ORDER — SODIUM CHLORIDE 0.9 % IV SOLN: 100.0000 mL | INTRAVENOUS | Status: DC | PRN

## 2020-09-17 MED ORDER — PRAMIPEXOLE DIHYDROCHLORIDE 0.25 MG PO TABS
0.2500 mg | ORAL_TABLET | Freq: Every day | ORAL | Status: DC
  Administered 2020-09-19 – 2020-09-24 (×7): 0.25 mg via ORAL
  Filled 2020-09-17 (×12): qty 1

## 2020-09-17 MED ORDER — PANTOPRAZOLE SODIUM 40 MG PO TBEC
40.0000 mg | DELAYED_RELEASE_TABLET | Freq: Every day | ORAL | Status: DC
  Administered 2020-09-17 – 2020-09-25 (×9): 40 mg via ORAL
  Filled 2020-09-17 (×9): qty 1

## 2020-09-17 MED ORDER — LEVOTHYROXINE SODIUM 75 MCG PO TABS
175.0000 ug | ORAL_TABLET | Freq: Every day | ORAL | Status: DC
  Administered 2020-09-17 – 2020-09-25 (×9): 175 ug via ORAL
  Filled 2020-09-17 (×4): qty 1
  Filled 2020-09-17: qty 4
  Filled 2020-09-17 (×4): qty 1

## 2020-09-17 MED ORDER — METHYLPREDNISOLONE SODIUM SUCC 40 MG IJ SOLR
40.0000 mg | Freq: Two times a day (BID) | INTRAMUSCULAR | Status: DC
  Administered 2020-09-17 (×2): 40 mg via INTRAVENOUS
  Filled 2020-09-17 (×2): qty 1

## 2020-09-17 MED ORDER — ASPIRIN EC 81 MG PO TBEC
81.0000 mg | DELAYED_RELEASE_TABLET | Freq: Every day | ORAL | Status: DC
  Administered 2020-09-17 – 2020-09-21 (×5): 81 mg via ORAL
  Filled 2020-09-17 (×5): qty 1

## 2020-09-17 MED ORDER — CHLORHEXIDINE GLUCONATE CLOTH 2 % EX PADS
6.0000 | MEDICATED_PAD | Freq: Every day | CUTANEOUS | Status: DC
  Administered 2020-09-19 – 2020-09-23 (×5): 6 via TOPICAL

## 2020-09-17 MED ORDER — HEPARIN SODIUM (PORCINE) 5000 UNIT/ML IJ SOLN
5000.0000 [IU] | Freq: Three times a day (TID) | INTRAMUSCULAR | Status: DC
  Administered 2020-09-17 – 2020-09-19 (×7): 5000 [IU] via SUBCUTANEOUS
  Filled 2020-09-17 (×9): qty 1

## 2020-09-17 MED ORDER — LIDOCAINE HCL (PF) 1 % IJ SOLN: 5.0000 mL | INTRAMUSCULAR | Status: DC | PRN

## 2020-09-17 MED ORDER — ALLOPURINOL 100 MG PO TABS
100.0000 mg | ORAL_TABLET | Freq: Every day | ORAL | Status: DC
  Administered 2020-09-17 – 2020-09-25 (×9): 100 mg via ORAL
  Filled 2020-09-17 (×12): qty 1

## 2020-09-17 MED ORDER — ALBUTEROL SULFATE HFA 108 (90 BASE) MCG/ACT IN AERS
2.0000 | INHALATION_SPRAY | Freq: Four times a day (QID) | RESPIRATORY_TRACT | Status: DC | PRN
  Filled 2020-09-17: qty 6.7

## 2020-09-17 MED ORDER — LIDOCAINE-PRILOCAINE 2.5-2.5 % EX CREA
1.0000 "application " | TOPICAL_CREAM | CUTANEOUS | Status: DC | PRN
  Filled 2020-09-17: qty 5

## 2020-09-17 MED ORDER — HEPARIN SODIUM (PORCINE) 5000 UNIT/ML IJ SOLN: 5000.0000 [IU] | Freq: Three times a day (TID) | INTRAMUSCULAR | Status: DC

## 2020-09-17 MED ORDER — HEPARIN SODIUM (PORCINE) 1000 UNIT/ML DIALYSIS
3000.0000 [IU] | INTRAMUSCULAR | Status: DC | PRN
  Filled 2020-09-17: qty 3

## 2020-09-17 MED ORDER — PENTAFLUOROPROP-TETRAFLUOROETH EX AERO: 1.0000 "application " | INHALATION_SPRAY | CUTANEOUS | Status: DC | PRN

## 2020-09-17 MED ORDER — METOPROLOL TARTRATE 25 MG PO TABS
25.0000 mg | ORAL_TABLET | Freq: Two times a day (BID) | ORAL | Status: DC
  Filled 2020-09-17: qty 1

## 2020-09-17 MED ORDER — HEPARIN SODIUM (PORCINE) 1000 UNIT/ML DIALYSIS
1000.0000 [IU] | INTRAMUSCULAR | Status: DC | PRN
  Filled 2020-09-17: qty 1

## 2020-09-17 NOTE — Consult Note (Signed)
[facility]  HISTORY AND PHYSICAL  Jeremy Johnson is an 74 y.o. male.    Chief Complaint: SOB  HPI: Pt is a 38M with a PMH sig for HTN, HLD, CAD, ESRD on HD TTS at [facility], COPD, CHF, small cell lung Ca who is now seen in consultation at the request of Dr. Karleen Hampshire for eval and recs re: management of ESRD and provision of HD.    Pt presented for one week history of worsening SOB.  Hasn't missed any dialysis rx.  He notes that he's been steadily losing weight.  Appetite isn't the greatest.  Was 99-100% on RA here.  CT chest with + pulm nodules and with stable L loculated pleural effusion.  Trop 53-66.  In this setting we are asked to see. He will be admitted for cardiology workup.    Dialysis orders:   TTS [facility] 4 hr via AVG EDW 66 kg, 3K 2.5 Ca bath BFR 400 DFR A1.5 Heparin 3000 u bolus Calcitriol 0.5 mcg TIW No ESA d/t malignancy  PMH: Past Medical History:  Diagnosis Date  . Anemia   . Blood transfusion without reported diagnosis   . CAD (coronary artery disease)    STENT... MID CIRCUMFLEX...1997  . Chronic kidney disease    STAGE 3  . COPD (chronic obstructive pulmonary disease) ([place])   . Degenerative joint disease (DJD) of lumbar spine   . GERD (gastroesophageal reflux disease)   . Gout   . Hyperlipidemia   . Hypertension   . Hypothyroidism   . Incisional hernia    abdomen  . Leukocytosis    CHRONIC MILD  . Myocardial infarction ([place])    1997  . SCL CA dx'd 01/2019   Lung cancer   PSH: Past Surgical History:  Procedure Laterality Date  . ABDOMINAL AORTIC ANEURYSM REPAIR  2006  . AV FISTULA PLACEMENT Left 04/25/2019   Procedure: ARTERIOVENOUS (AV) FISTULA CREATION LEFT ARM;  Surgeon: Angelia Mould, MD;  Location: [place];  Service: Vascular;  Laterality: Left;  . AV FISTULA PLACEMENT Left 05/19/2019   Procedure: CONVERSION OF LEFT ARM ARTERIOVENOUS FISTULA TO GRAFT;  Surgeon: Angelia Mould, MD;  Location: [place];  Service:  Vascular;  Laterality: Left;  . BIOPSY  09/30/2018   Procedure: BIOPSY;  Surgeon: Danie Binder, MD;  [facility];  Service: Endoscopy;;  ascending colon  . BIOPSY  07/27/2020   Procedure: BIOPSY;  Surgeon: Eloise Harman, DO;  [facility];  Service: Endoscopy;;  gastric  . COLONOSCOPY  2008  . COLONOSCOPY N/A 09/30/2018   External and internal hemorrhoids, six polyps removed, one ascending colon polypoid lesion biopsied. Six simple adenomas and one benign polypoid lesion. Colonoscopy Nov 2022.   [name] COLONOSCOPY WITH PROPOFOL N/A 07/27/2020   Procedure: COLONOSCOPY WITH PROPOFOL;  Surgeon: Eloise Harman, DO;  [facility];  Service: Endoscopy;  Laterality: N/A;  11:15am  . CORONARY ANGIOPLASTY WITH STENT PLACEMENT  1997   MID CIRCUMFLEX  . ESOPHAGOGASTRODUODENOSCOPY (EGD) WITH PROPOFOL N/A 07/27/2020   Procedure: ESOPHAGOGASTRODUODENOSCOPY (EGD) WITH PROPOFOL;  Surgeon: Eloise Harman, DO;  [facility];  Service: Endoscopy;  Laterality: N/A;  . HIP ARTHROPLASTY Right 04/30/2020   Procedure: ARTHROPLASTY  HIP (HEMIARTHROPLASTY);  Surgeon: Altamese High Hill, MD;  Location: [place];  Service: Orthopedics;  Laterality: Right;  . IR FLUORO GUIDE CV LINE RIGHT  04/22/2019  . IR FLUORO GUIDE CV LINE RIGHT  05/01/2020  . IR  THROMBECTOMY AV FISTULA W/THROMBOLYSIS/PTA INC/SHUNT/IMG LEFT Left 05/03/2020  . IR US GUIDE VASC ACCESS LEFT  05/03/2020  . IR US GUIDE VASC ACCESS RIGHT  04/22/2019  . IR US GUIDE VASC ACCESS RIGHT  05/01/2020  . POLYPECTOMY  09/30/2018   Procedure: POLYPECTOMY;  Surgeon: Danie Binder, MD;  [facility];  Service: Endoscopy;;  colon  . POLYPECTOMY  07/27/2020   Procedure: POLYPECTOMY;  Surgeon: Eloise Harman, DO;  [facility];  Service: Endoscopy;;  . VIDEO BRONCHOSCOPY WITH ENDOBRONCHIAL NAVIGATION N/A 01/27/2019   Procedure: VIDEO BRONCHOSCOPY WITH ENDOBRONCHIAL NAVIGATION;  Surgeon: Grace Isaac, MD;   Location: [place];  Service: Thoracic;  Laterality: N/A;  . VIDEO BRONCHOSCOPY WITH ENDOBRONCHIAL ULTRASOUND N/A 01/27/2019   Procedure: VIDEO BRONCHOSCOPY WITH ENDOBRONCHIAL ULTRASOUND;  Surgeon: Grace Isaac, MD;  Location: [place];  Service: Thoracic;  Laterality: N/A;    Past Medical History:  Diagnosis Date  . Anemia   . Blood transfusion without reported diagnosis   . CAD (coronary artery disease)    STENT... MID CIRCUMFLEX...1997  . Chronic kidney disease    STAGE 3  . COPD (chronic obstructive pulmonary disease) ([place])   . Degenerative joint disease (DJD) of lumbar spine   . GERD (gastroesophageal reflux disease)   . Gout   . Hyperlipidemia   . Hypertension   . Hypothyroidism   . Incisional hernia    abdomen  . Leukocytosis    CHRONIC MILD  . Myocardial infarction ([place])    1997  . SCL CA dx'd 01/2019   Lung cancer    Medications: reviewed and include: Amlodipine 10 mg dialy Clonidine 0.1 BID Metop 25 mg BID dialyvite with zinc No binders   ALLERGIES:   Allergies  Allergen Reactions  . Advair Hfa [Fluticasone-Salmeterol] Other (See Comments)    Developed thrush, although the mouth WAS being rinsed as directed  . Penicillins Rash    Has patient had a PCN reaction causing immediate rash, facial/tongue/throat swelling, SOB or lightheadedness with hypotension: No Has patient had a PCN reaction causing severe rash involving mucus membranes or skin necrosis: No Has patient had a PCN reaction that required hospitalization: No Has patient had a PCN reaction occurring within the last 10 years: No If all of the above answers are "NO", then may proceed with Cephalosporin use.     FAM HX: Family History  Problem Relation Age of Onset  . Stroke Brother   . Lung cancer Sister 24       lung cancer/former  . Colon cancer Neg Hx   . Colon polyps Neg Hx     Social History:   reports that he quit smoking about 3 years ago. He has a 27.00 pack-year smoking history.  He has never used smokeless tobacco. He reports previous alcohol use. He reports that he does not use drugs.  ROS: ROS: all other systems reviewed and are negative except as per HPI   Blood pressure 104/69, pulse (!) 111, temperature 98 F (36.7 C), temperature source Oral, resp. rate (!) 31, SpO2 96 %. PHYSICAL EXAM: Physical Exam  GEN: NAD, sitting in bed HEENT EOMI PERRL NECK + JVD PULM basilar crackles RLL, muffled LLL  CV tachy, no m/r/g ABD soft, nontender EXT no LE edema NEURO AAO x 3 nonfocal SKIN no rashes ACCESS: AVG + T/B   Results for orders placed or performed during the hospital encounter of 09/16/20 (from the past 48 hour(s))  Respiratory Panel by RT  PCR (Flu A&B, Covid) - Nasopharyngeal Swab     Status: None   Collection Time: 09/16/20  4:29 PM   Specimen: Nasopharyngeal Swab  Result Value Ref Range   SARS Coronavirus 2 by RT PCR NEGATIVE NEGATIVE    Comment: (NOTE) SARS-CoV-2 target nucleic acids are NOT DETECTED.  The SARS-CoV-2 RNA is generally detectable in upper respiratoy specimens during the acute phase of infection. The lowest concentration of SARS-CoV-2 viral copies this assay can detect is 131 copies/mL. A negative result does not preclude SARS-Cov-2 infection and should not be used as the sole basis for treatment or other patient management decisions. A negative result may occur with  improper specimen collection/handling, submission of specimen other than nasopharyngeal swab, presence of viral mutation(s) within the areas targeted by this assay, and inadequate number of viral copies (<131 copies/mL). A negative result must be combined with clinical observations, patient history, and epidemiological information. The expected result is Negative.  Fact Sheet for Patients:  PinkCheek.be  Fact Sheet for Healthcare Providers:  GravelBags.it  This test is no t yet approved or cleared by the  [place] FDA and  has been authorized for detection and/or diagnosis of SARS-CoV-2 by FDA under an Emergency Use Authorization (EUA). This EUA will remain  in effect (meaning this test can be used) for the duration of the COVID-19 declaration under Section 564(b)(1) of the Act, 21 U.S.C. section 360bbb-3(b)(1), unless the authorization is terminated or revoked sooner.     Influenza A by PCR NEGATIVE NEGATIVE   Influenza B by PCR NEGATIVE NEGATIVE    Comment: (NOTE) The Xpert Xpress SARS-CoV-2/FLU/RSV assay is intended as an aid in  the diagnosis of influenza from Nasopharyngeal swab specimens and  should not be used as a sole basis for treatment. Nasal washings and  aspirates are unacceptable for Xpert Xpress SARS-CoV-2/FLU/RSV  testing.  Fact Sheet for Patients: PinkCheek.be  Fact Sheet for Healthcare Providers: GravelBags.it  This test is not yet approved or cleared by the [place] FDA and  has been authorized for detection and/or diagnosis of SARS-CoV-2 by  FDA under an Emergency Use Authorization (EUA). This EUA will remain  in effect (meaning this test can be used) for the duration of the  Covid-19 declaration under Section 564(b)(1) of the Act, 21  U.S.C. section 360bbb-3(b)(1), unless the authorization is  terminated or revoked. Performed at [facility], [address] [place], [place] [address]   CBC     Status: Abnormal   Collection Time: 09/16/20  4:36 PM  Result Value Ref Range   WBC 8.5 4.0 - 10.5 K/uL   RBC 2.66 (L) 4.22 - 5.81 MIL/uL   Hemoglobin 8.4 (L) 13.0 - 17.0 g/dL   HCT 26.4 (L) 39 - 52 %   MCV 99.2 80.0 - 100.0 fL   MCH 31.6 26.0 - 34.0 pg   MCHC 31.8 30.0 - 36.0 g/dL   RDW 15.9 (H) 11.5 - 15.5 %   Platelets 221 150 - 400 K/uL   nRBC 0.0 0.0 - 0.2 %    Comment: Performed at [facility], [address], [place], [place] [address]  Basic metabolic panel     Status: Abnormal    Collection Time: 09/16/20  4:36 PM  Result Value Ref Range   Sodium 134 (L) 135 - 145 mmol/L   Potassium 4.1 3.5 - 5.1 mmol/L   Chloride 94 (L) 98 - 111 mmol/L   CO2 29 22 - 32 mmol/L   Glucose, Bld  118 (H) 70 - 99 mg/dL    Comment: Glucose reference range applies only to samples taken after fasting for at least 8 hours.   BUN 20 8 - 23 mg/dL   Creatinine, Ser 3.76 (H) 0.61 - 1.24 mg/dL   Calcium 8.7 (L) 8.9 - 10.3 mg/dL   GFR, Estimated 16 (L) >60 mL/min    Comment: (NOTE) Calculated using the CKD-EPI Creatinine Equation (2021)    Anion gap 11 5 - 15    Comment: Performed at [facility], [address], [place], [place] [address]  Brain natriuretic peptide     Status: Abnormal   Collection Time: 09/16/20  4:36 PM  Result Value Ref Range   B Natriuretic Peptide >4,500.0 (H) 0.0 - 100.0 pg/mL    Comment: Performed at [facility], [address], [place], [place] [address]  Troponin I (High Sensitivity)     Status: Abnormal   Collection Time: 09/16/20  4:49 PM  Result Value Ref Range   Troponin I (High Sensitivity) 79 (H) <18 ng/L    Comment: (NOTE) Elevated high sensitivity troponin I (hsTnI) values and significant  changes across serial measurements may suggest ACS but many other  chronic and acute conditions are known to elevate hsTnI results.  Refer to the "Links" section for chest pain algorithms and additional  guidance. Performed at [facility], [address], [place], [place] [address]   Troponin I (High Sensitivity)     Status: Abnormal   Collection Time: 09/16/20  6:57 PM  Result Value Ref Range   Troponin I (High Sensitivity) 78 (H) <18 ng/L    Comment: (NOTE) Elevated high sensitivity troponin I (hsTnI) values and significant  changes across serial measurements may suggest ACS but many other  chronic and acute conditions are known to elevate hsTnI results.  Refer to the "Links" section for chest pain algorithms and additional  guidance. Performed at [facility], [address] [place], [place] [address]   Comprehensive metabolic panel     Status: Abnormal   Collection Time: 09/17/20  5:24 AM  Result Value Ref Range   Sodium 134 (L) 135 - 145 mmol/L   Potassium 4.7 3.5 - 5.1 mmol/L   Chloride 95 (L) 98 - 111 mmol/L   CO2 26 22 - 32 mmol/L   Glucose, Bld 88 70 - 99 mg/dL    Comment: Glucose reference range applies only to samples taken after fasting for at least 8 hours.   BUN 30 (H) 8 - 23 mg/dL   Creatinine, Ser 4.96 (H) 0.61 - 1.24 mg/dL   Calcium 8.6 (L) 8.9 - 10.3 mg/dL   Total Protein 7.2 6.5 - 8.1 g/dL   Albumin 2.7 (L) 3.5 - 5.0 g/dL   AST 346 (H) 15 - 41 U/L   ALT 196 (H) 0 - 44 U/L   Alkaline Phosphatase 107 38 - 126 U/L   Total Bilirubin 0.7 0.3 - 1.2 mg/dL   GFR, Estimated 12 (L) >60 mL/min    Comment: (NOTE) Calculated using the CKD-EPI Creatinine Equation (2021)    Anion gap 13 5 - 15    Comment: Performed at [facility], [address] [place], [place] [address]  CBC     Status: Abnormal   Collection Time: 09/17/20  5:24 AM  Result Value Ref Range   WBC 7.3 4.0 - 10.5 K/uL   RBC 2.39 (L) 4.22 - 5.81 MIL/uL   Hemoglobin 7.6 (L) 13.0 - 17.0 g/dL   HCT 23.6 (  L) 39 - 52 %   MCV 98.7 80.0 - 100.0 fL   MCH 31.8 26.0 - 34.0 pg   MCHC 32.2 30.0 - 36.0 g/dL   RDW 15.7 (H) 11.5 - 15.5 %   Platelets 183 150 - 400 K/uL   nRBC 0.0 0.0 - 0.2 %    Comment: Performed at [facility], [address] [place], [place] [address]  Protime-INR     Status: Abnormal   Collection Time: 09/17/20  5:24 AM  Result Value Ref Range   Prothrombin Time 15.4 (H) 11.4 - 15.2 seconds   INR 1.3 (H) 0.8 - 1.2    Comment: (NOTE) INR goal varies based on device and disease states. Performed at [facility], [address], [place], [place] [address]   APTT     Status: None   Collection Time: 09/17/20  5:24 AM  Result Value Ref Range   aPTT 34 24 - 36 seconds    Comment: Performed at [facility], [address] [place], [place] [address]   Magnesium     Status: None   Collection Time: 09/17/20  5:24 AM  Result Value Ref Range   Magnesium 2.0 1.7 - 2.4 mg/dL    Comment: Performed at [facility], [address], [place], [place] [address]  Phosphorus     Status: None   Collection Time: 09/17/20  5:24 AM  Result Value Ref Range   Phosphorus 4.6 2.5 - 4.6 mg/dL    Comment: Performed at [facility], [address], [place],  [address]    CT Chest Wo Contrast  Result Date: 09/16/2020 CLINICAL DATA:  COPD exacerbation. Intermittent shortness of breath for 2 weeks. Patient with history of small cell lung cancer. EXAM: CT CHEST WITHOUT CONTRAST TECHNIQUE: Multidetector CT imaging of the chest was performed following the standard protocol without IV contrast. COMPARISON:  Radiograph earlier today. Most recent chest CT 07/20/2020 FINDINGS: Cardiovascular: Chronic cardiomegaly. Coronary artery calcifications. No pericardial effusion. There is diffuse aortic atherosclerosis and tortuosity. Focal dilatation of the proximal transverse aorta measuring 4.6 cm, unchanged. Focal dilatation at the diaphragmatic hiatus of 4.1 cm unchanged. There is no periaortic stranding. Mediastinum/Nodes: Paucity of mediastinal fat lack of IV contrast limits detailed assessment. There are multiple small mediastinal nodes are unchanged from prior exam and not enlarged by size criteria. Post treatment changes in the left hilar region, grossly stable. Hilar assessment limited in the absence of IV contrast. No esophageal wall thickening. No evidence of thyroid nodule. Lungs/Pleura: Advanced emphysema. Left paramediastinal post treatment related changes with volume loss and ill-defined opacity, stable. Unchanged partially loculated left pleural effusion. Multiple pulmonary nodules. Right upper lobe nodule measures 2 mm, series 4, image 31, previously 4 mm. Subpleural right upper lobe pulmonary nodule measures 7 mm, series 4, image 37, unchanged or slightly decreased.  Continued decreased size of a linear subpleural opacity in the left lower lobe, series 4, image 79, with no definite AP component to measure, 10 mm transverse, previously 12 mm. Chronic fissural thickening in the right minor fissure, unchanged. There is no new pulmonary nodule. No evidence of acute airspace disease. No findings of pulmonary edema. Small right pleural effusion on prior has diminished, minimal pleural thickening inferiorly. No endobronchial lesion. Upper Abdomen: No acute findings. Musculoskeletal: There are no acute or suspicious osseous abnormalities. No evidence of focal bone lesion. IMPRESSION: 1. No acute findings. 2. Emphysema with post treatment related changes in the left lung, not significantly changed from September 2021 chest  CT. 3. Stable small left pleural effusion that is partially loculated. Previous right pleural effusion is diminished. 4. Multiple pulmonary nodules, stable or slightly decreased in size from prior exam. No new pulmonary nodule or mass. 5. Stable cardiomegaly. Stable aortic atherosclerosis with proximal transverse aortic aneurysm of 4.6 cm. No evidence of acute aortic abnormality or inflammation. Recommend semiannual imaging with CTA or MRA. Aortic Atherosclerosis (ICD10-I70.0) and Emphysema (ICD10-J43.9). Electronically Signed   By: Keith Rake M.D.   On: 09/16/2020 18:54   US Abdomen Limited  Result Date: 09/17/2020 CLINICAL DATA:  Elevated LFTs. EXAM: ULTRASOUND ABDOMEN LIMITED RIGHT UPPER QUADRANT COMPARISON:  CT AP 04/17/2019 FINDINGS: Note: Exam was performed in the nonfasting state. Patient ate breakfast at 10 a.m. Gallbladder: Partially collapsed gallbladder is identified with mild wall thickening measuring 4 mm. No gallbladder sludge, stones. Negative sonographic Murphy's sign. No pericholecystic fluid. Common bile duct: Diameter: 4 mm Liver: No focal lesion identified. Within normal limits in parenchymal echogenicity. Portal vein is patent on color  Doppler imaging with normal direction of blood flow towards the liver. Other: Right pleural effusion and multiple right renal cysts are again noted. IMPRESSION: 1. Mild gallbladder wall thickening measuring 4 mm in thickness. This is a nonspecific finding in the nonfasting state and may reflect incomplete distension. No gallstones, gallbladder sludge or pericholecystic fluid. If there is a clinical concern for acute cholecystitis consider further investigation with nuclear medicine hepatic biliary scan. 2. Incidental note of right pleural effusion and right kidney cysts. Electronically Signed   By: Kerby Moors M.D.   On: 09/17/2020 12:16   DG Chest Portable 1 View  Result Date: 09/16/2020 CLINICAL DATA:  74 year old male with shortness of breath for 2 weeks. Abnormal EKG. EXAM: PORTABLE CHEST 1 VIEW COMPARISON:  Chest radiographs 09/09/2020 and earlier - including chest CT 07/20/2020. FINDINGS: Portable AP upright view at 1645 hours. Stable cardiomegaly and mediastinal contours. Continued dense retrocardiac opacification, left pleural effusion demonstrated in September. Superimposed chronic left perihilar radiation changes to the lung. Underlying bilateral chronic increased interstitial markings, emphysema demonstrated on prior CT. No overt edema. No pneumothorax. Stable visualized osseous structures. IMPRESSION: 1. Stable ventilation since 09/09/2020. 2. Continued dense left lung base opacity likely representing a combination of pleural effusion and atelectasis. 3. Chronic cardiomegaly, emphysema and radiation pulmonary changes. Electronically Signed   By: Genevie Ann M.D.   On: 09/16/2020 17:21    Assessment/Plan 1.  SOB: admitted for cardiology workup, TTE pending.  Has been losing weight as OP and will need more EDW challenge. 2.  ESRD on TTS: [place].  Will do HD on schedule tomorrow.   3.  HTN: on amlodipine, metop, and clonidine as OP- pressures soft here, would hold for now.   4.  Anemia: no  ESA d/t malignancy 5.  BMD: will do calcitriol TIW 6.  Nutrition: renal vit with zinc, will see if biotin will help appetite 7.  Dispo: being admitted  [place], [place] 09/17/2020, 1:16 PM

## 2020-09-17 NOTE — Progress Notes (Addendum)
PROGRESS NOTE    Jeremy Johnson  CBJ:[id number] DOB: 02-Apr-1946 DOA: 09/16/2020 PCP: [name], MD   Chief Complaint  Patient presents with  . Shortness of Breath    Brief Narrative:   74 year old gentleman with prior history of end-stage renal disease on dialysis, coronary artery disease, COPD, hypertension, hyperlipidemia, hypothyroidism, history of small cell lung cancer presents to ED for worsening shortness of breath associated with tachycardia and tachypnea patient reports that he had dialysis on 11/4 and was on his way to his home when he felt short of breath. On admission his labs were significant for elevated BNP, minimally elevated troponins. CT chest without contrast did not show any pulmonary edema or opacity showed multiple pulmonary nodules which is similar to the old imaging. Currently is requiring up to 3 L of nasal cannula oxygen, tachypneic, tachycardic and not at baseline. Assessment & Plan:   Active Problems:   Acquired hypothyroidism   HLD (hyperlipidemia)   Essential hypertension   GERD   Small cell lung cancer (HCC)   ESRD (end stage renal disease) on dialysis (HCC)   Shortness of breath   Chronic obstructive pulmonary disease/emphysema   Acute CHF (congestive heart failure) (HCC)   Restless leg syndrome   Elevated troponin I level   Shortness of breath Rule out CHF, probably a component of COPD exacerbation. CT chest without contrast did not show any pulmonary edema or pneumonia. Ordered solumedrol 40 mg IV bid , Recommend a short course of steroids to see if his breathing improves.  Cardiology consulted for further evaluation Echocardiogram ordered, minimally elevated troponin's.    ESRD on HD Last HD on 11/4.  Nephrology consulted.    CAD:  Pt currently denies chest pain.  Resume aspirin, crestor on hold due to elevated liver enzymes.    Essential hypertension:  BP parameters are optimal.    Hypothyroidism:  Continue with  Synthroid.   Hyperlipidemia Crestor on hold due to elevated liver enzymes.   Elevated liver enzymes Patient currently denies any nausea vomiting or abdominal pain. Statin is on hold. Ultrasound of the abdomen ordered for further evaluation.   GERD Continue with Protonix.   Restless leg syndrome continue with home medication.   Pulmonary nodules CT chest showed multiple pulmonary nodules similar to old imaging.   DVT prophylaxis: heparin  Code Status: (Full code.  Family Communication: wife at bedside.  Disposition:   Status is: Inpatient  Remains inpatient appropriate because:Ongoing diagnostic testing needed not appropriate for outpatient work up, IV treatments appropriate due to intensity of illness or inability to take PO and Inpatient level of care appropriate due to severity of illness   Dispo: The patient is from: Home              Anticipated d/c is to: pending.               Anticipated d/c date is: 2 days              Patient currently is not medically stable to d/c.       Consultants:   Cardiology.   Procedures:  Echocardiogram.    Antimicrobials: none.    Subjective: Reports feeling better than yesterday.   Objective: Vitals:   09/17/20 0600 09/17/20 0700 09/17/20 0800 09/17/20 0952  BP: 102/76 111/68 104/69 108/76  Pulse: (!) 110 (!) 110 (!) 109 (!) 108  Resp: (!) 36 (!) 31 (!) 30 15  Temp:      TempSrc:  SpO2: 95%   97%   No intake or output data in the 24 hours ending 09/17/20 1042 There were no vitals filed for this visit.  Examination:  General exam: Appears calm and comfortable on 3lit of [place] oxygen,  Respiratory system: diminished at bases, tachypnea, scattered wheezing posteriorly.  Cardiovascular system: S1 & S2 heard, RRR.  No pedal edema. Gastrointestinal system: Abdomen is nondistended, soft and nontender. Normal bowel sounds heard. Central nervous system: Alert and oriented. Grossly non focal.  Extremities:  Symmetric 5 x 5 power. Skin: No rashes, lesions or ulcers Psychiatry: Mood & affect appropriate.     Data Reviewed: I have personally reviewed following labs and imaging studies  CBC: Recent Labs  Lab 09/16/20 1636 09/17/20 0524  WBC 8.5 7.3  HGB 8.4* 7.6*  HCT 26.4* 23.6*  MCV 99.2 98.7  PLT 221 301    Basic Metabolic Panel: Recent Labs  Lab 09/16/20 1636 09/17/20 0524  NA 134* 134*  K 4.1 4.7  CL 94* 95*  CO2 29 26  GLUCOSE 118* 88  BUN 20 30*  CREATININE 3.76* 4.96*  CALCIUM 8.7* 8.6*  MG  --  2.0  PHOS  --  4.6    GFR: CrCl cannot be calculated (Unknown ideal weight.).  Liver Function Tests: Recent Labs  Lab 09/17/20 0524  AST 346*  ALT 196*  ALKPHOS 107  BILITOT 0.7  PROT 7.2  ALBUMIN 2.7*    CBG: No results for input(s): GLUCAP in the last 168 hours.   Recent Results (from the past 240 hour(s))  Respiratory Panel by RT PCR (Flu A&B, Covid) - Nasopharyngeal Swab     Status: None   Collection Time: 09/16/20  4:29 PM   Specimen: Nasopharyngeal Swab  Result Value Ref Range Status   SARS Coronavirus 2 by RT PCR NEGATIVE NEGATIVE Final    Comment: (NOTE) SARS-CoV-2 target nucleic acids are NOT DETECTED.  The SARS-CoV-2 RNA is generally detectable in upper respiratoy specimens during the acute phase of infection. The lowest concentration of SARS-CoV-2 viral copies this assay can detect is 131 copies/mL. A negative result does not preclude SARS-Cov-2 infection and should not be used as the sole basis for treatment or other patient management decisions. A negative result may occur with  improper specimen collection/handling, submission of specimen other than nasopharyngeal swab, presence of viral mutation(s) within the areas targeted by this assay, and inadequate number of viral copies (<131 copies/mL). A negative result must be combined with clinical observations, patient history, and epidemiological information. The expected result is  Negative.  Fact Sheet for Patients:  PinkCheek.be  Fact Sheet for Healthcare Providers:  GravelBags.it  This test is no t yet approved or cleared by the [place] FDA and  has been authorized for detection and/or diagnosis of SARS-CoV-2 by FDA under an Emergency Use Authorization (EUA). This EUA will remain  in effect (meaning this test can be used) for the duration of the COVID-19 declaration under Section 564(b)(1) of the Act, 21 U.S.C. section 360bbb-3(b)(1), unless the authorization is terminated or revoked sooner.     Influenza A by PCR NEGATIVE NEGATIVE Final   Influenza B by PCR NEGATIVE NEGATIVE Final    Comment: (NOTE) The Xpert Xpress SARS-CoV-2/FLU/RSV assay is intended as an aid in  the diagnosis of influenza from Nasopharyngeal swab specimens and  should not be used as a sole basis for treatment. Nasal washings and  aspirates are unacceptable for Xpert Xpress SARS-CoV-2/FLU/RSV  testing.  Fact Sheet for  Patients: PinkCheek.be  Fact Sheet for Healthcare Providers: GravelBags.it  This test is not yet approved or cleared by the [place] FDA and  has been authorized for detection and/or diagnosis of SARS-CoV-2 by  FDA under an Emergency Use Authorization (EUA). This EUA will remain  in effect (meaning this test can be used) for the duration of the  Covid-19 declaration under Section 564(b)(1) of the Act, 21  U.S.C. section 360bbb-3(b)(1), unless the authorization is  terminated or revoked. Performed at [facility], [address], [place], [place] [address]          Radiology Studies: CT Chest Wo Contrast  Result Date: 09/16/2020 CLINICAL DATA:  COPD exacerbation. Intermittent shortness of breath for 2 weeks. Patient with history of small cell lung cancer. EXAM: CT CHEST WITHOUT CONTRAST TECHNIQUE: Multidetector CT imaging of the chest  was performed following the standard protocol without IV contrast. COMPARISON:  Radiograph earlier today. Most recent chest CT 07/20/2020 FINDINGS: Cardiovascular: Chronic cardiomegaly. Coronary artery calcifications. No pericardial effusion. There is diffuse aortic atherosclerosis and tortuosity. Focal dilatation of the proximal transverse aorta measuring 4.6 cm, unchanged. Focal dilatation at the diaphragmatic hiatus of 4.1 cm unchanged. There is no periaortic stranding. Mediastinum/Nodes: Paucity of mediastinal fat lack of IV contrast limits detailed assessment. There are multiple small mediastinal nodes are unchanged from prior exam and not enlarged by size criteria. Post treatment changes in the left hilar region, grossly stable. Hilar assessment limited in the absence of IV contrast. No esophageal wall thickening. No evidence of thyroid nodule. Lungs/Pleura: Advanced emphysema. Left paramediastinal post treatment related changes with volume loss and ill-defined opacity, stable. Unchanged partially loculated left pleural effusion. Multiple pulmonary nodules. Right upper lobe nodule measures 2 mm, series 4, image 31, previously 4 mm. Subpleural right upper lobe pulmonary nodule measures 7 mm, series 4, image 37, unchanged or slightly decreased. Continued decreased size of a linear subpleural opacity in the left lower lobe, series 4, image 79, with no definite AP component to measure, 10 mm transverse, previously 12 mm. Chronic fissural thickening in the right minor fissure, unchanged. There is no new pulmonary nodule. No evidence of acute airspace disease. No findings of pulmonary edema. Small right pleural effusion on prior has diminished, minimal pleural thickening inferiorly. No endobronchial lesion. Upper Abdomen: No acute findings. Musculoskeletal: There are no acute or suspicious osseous abnormalities. No evidence of focal bone lesion. IMPRESSION: 1. No acute findings. 2. Emphysema with post treatment  related changes in the left lung, not significantly changed from September 2021 chest CT. 3. Stable small left pleural effusion that is partially loculated. Previous right pleural effusion is diminished. 4. Multiple pulmonary nodules, stable or slightly decreased in size from prior exam. No new pulmonary nodule or mass. 5. Stable cardiomegaly. Stable aortic atherosclerosis with proximal transverse aortic aneurysm of 4.6 cm. No evidence of acute aortic abnormality or inflammation. Recommend semiannual imaging with CTA or MRA. Aortic Atherosclerosis (ICD10-I70.0) and Emphysema (ICD10-J43.9). Electronically Signed   By: Keith Rake M.D.   On: 09/16/2020 18:54   DG Chest Portable 1 View  Result Date: 09/16/2020 CLINICAL DATA:  74 year old male with shortness of breath for 2 weeks. Abnormal EKG. EXAM: PORTABLE CHEST 1 VIEW COMPARISON:  Chest radiographs 09/09/2020 and earlier - including chest CT 07/20/2020. FINDINGS: Portable AP upright view at 1645 hours. Stable cardiomegaly and mediastinal contours. Continued dense retrocardiac opacification, left pleural effusion demonstrated in September. Superimposed chronic left perihilar radiation changes to the lung. Underlying bilateral chronic increased interstitial  markings, emphysema demonstrated on prior CT. No overt edema. No pneumothorax. Stable visualized osseous structures. IMPRESSION: 1. Stable ventilation since 09/09/2020. 2. Continued dense left lung base opacity likely representing a combination of pleural effusion and atelectasis. 3. Chronic cardiomegaly, emphysema and radiation pulmonary changes. Electronically Signed   By: Genevie Ann M.D.   On: 09/16/2020 17:21        Scheduled Meds: . allopurinol  100 mg Oral Daily  . aspirin EC  81 mg Oral QHS  . [START ON 09/18/2020] levothyroxine  175 mcg Oral QAC breakfast  . metoprolol tartrate  25 mg Oral BID  . pantoprazole  40 mg Oral Daily  . pramipexole  0.25 mg Oral QHS   Continuous Infusions:    LOS: 1 day        Hosie Poisson, MD [facility]   To contact the attending provider between 7A-7P or the covering provider during after hours 7P-7A, please log into the web site [email or web address] and access using universal [place] password for that web site. If you do not have the password, please call the hospital operator.  09/17/2020, 10:42 AM

## 2020-09-17 NOTE — Consult Note (Signed)
Cardiology Consultation:   Patient ID: Jeremy Johnson MRN: [id number]; DOB: [birth year]  Admit date: 09/16/2020 Date of Consult: 09/17/2020  Primary Care Provider: [name], MD [facility] Cardiologist: New, Dr Drisc[facility] Electrophysiologist:  None    Patient Profile:   Jeremy Johnson is a 74 y.o. male with a hx of ESRD, CAD with prior stent who is being seen today for the evaluation of SOB at the request of Dr Karleen Hampshire.  History of Present Illness:   Mr. Lipa 74 yo male history of ESRD, CAD with chart mentioning stent to LCX in 1997, COPD, limited stage small cell lung cancer previously on chemo not currently, HTN admitted with 3 months SOB. Has not missed any HD sessions.    WBC 8.5 Hgb 8.4 Plt 221 K 4.1 Cr 3.76 BUN 20 Cr 3.76 BNP >4500 AST 346 ALT 196  hstrop 79-->78 COVID neg EKG sinus tach, LVH with chronic strain pattern  CXR LLL opacity, likely effusion and atelectasis.  CT chest: emphysema, lung nodules Abd [place]: no acute process Echo : LVEF 15-20%  Past Medical History:  Diagnosis Date  . Anemia   . Blood transfusion without reported diagnosis   . CAD (coronary artery disease)    STENT... MID CIRCUMFLEX...1997  . Chronic kidney disease    STAGE 3  . COPD (chronic obstructive pulmonary disease) ([place])   . Degenerative joint disease (DJD) of lumbar spine   . GERD (gastroesophageal reflux disease)   . Gout   . Hyperlipidemia   . Hypertension   . Hypothyroidism   . Incisional hernia    abdomen  . Leukocytosis    CHRONIC MILD  . Myocardial infarction ([place])    1997  . SCL CA dx'd 01/2019   Lung cancer    Past Surgical History:  Procedure Laterality Date  . ABDOMINAL AORTIC ANEURYSM REPAIR  2006  . AV FISTULA PLACEMENT Left 04/25/2019   Procedure: ARTERIOVENOUS (AV) FISTULA CREATION LEFT ARM;  Surgeon: Angelia Mould, MD;  Location: [place];  Service: Vascular;  Laterality: Left;  . AV FISTULA PLACEMENT Left 05/19/2019   Procedure:  CONVERSION OF LEFT ARM ARTERIOVENOUS FISTULA TO GRAFT;  Surgeon: Angelia Mould, MD;  Location: [place];  Service: Vascular;  Laterality: Left;  . BIOPSY  09/30/2018   Procedure: BIOPSY;  Surgeon: Danie Binder, MD;  [facility];  Service: Endoscopy;;  ascending colon  . BIOPSY  07/27/2020   Procedure: BIOPSY;  Surgeon: Eloise Harman, DO;  [facility];  Service: Endoscopy;;  gastric  . COLONOSCOPY  2008  . COLONOSCOPY N/A 09/30/2018   External and internal hemorrhoids, six polyps removed, one ascending colon polypoid lesion biopsied. Six simple adenomas and one benign polypoid lesion. Colonoscopy Nov 2022.   [name] COLONOSCOPY WITH PROPOFOL N/A 07/27/2020   Procedure: COLONOSCOPY WITH PROPOFOL;  Surgeon: Eloise Harman, DO;  [facility];  Service: Endoscopy;  Laterality: N/A;  11:15am  . CORONARY ANGIOPLASTY WITH STENT PLACEMENT  1997   MID CIRCUMFLEX  . ESOPHAGOGASTRODUODENOSCOPY (EGD) WITH PROPOFOL N/A 07/27/2020   Procedure: ESOPHAGOGASTRODUODENOSCOPY (EGD) WITH PROPOFOL;  Surgeon: Eloise Harman, DO;  [facility];  Service: Endoscopy;  Laterality: N/A;  . HIP ARTHROPLASTY Right 04/30/2020   Procedure: ARTHROPLASTY  HIP (HEMIARTHROPLASTY);  Surgeon: Altamese Rosenhayn, MD;  Location: [place];  Service: Orthopedics;  Laterality: Right;  . IR FLUORO GUIDE CV LINE RIGHT  04/22/2019  . IR FLUORO GUIDE CV LINE RIGHT  05/01/2020  . IR THROMBECTOMY AV FISTULA W/THROMBOLYSIS/PTA INC/SHUNT/IMG LEFT Left 05/03/2020  . IR US GUIDE VASC ACCESS LEFT  05/03/2020  . IR US GUIDE VASC ACCESS RIGHT  04/22/2019  . IR US GUIDE VASC ACCESS RIGHT  05/01/2020  . POLYPECTOMY  09/30/2018   Procedure: POLYPECTOMY;  Surgeon: Danie Binder, MD;  [facility];  Service: Endoscopy;;  colon  . POLYPECTOMY  07/27/2020   Procedure: POLYPECTOMY;  Surgeon: Eloise Harman, DO;  [facility];  Service: Endoscopy;;  . VIDEO BRONCHOSCOPY WITH ENDOBRONCHIAL  NAVIGATION N/A 01/27/2019   Procedure: VIDEO BRONCHOSCOPY WITH ENDOBRONCHIAL NAVIGATION;  Surgeon: Grace Isaac, MD;  Location: [place];  Service: Thoracic;  Laterality: N/A;  . VIDEO BRONCHOSCOPY WITH ENDOBRONCHIAL ULTRASOUND N/A 01/27/2019   Procedure: VIDEO BRONCHOSCOPY WITH ENDOBRONCHIAL ULTRASOUND;  Surgeon: Grace Isaac, MD;  Location: [place];  Service: Thoracic;  Laterality: N/A;     Inpatient Medications: Scheduled Meds: . allopurinol  100 mg Oral Daily  . aspirin EC  81 mg Oral QHS  . [START ON 09/18/2020] Chlorhexidine Gluconate Cloth  6 each Topical Q0600  . heparin injection (subcutaneous)  5,000 Units Subcutaneous Q8H  . levothyroxine  175 mcg Oral Q0600  . methylPREDNISolone (SOLU-MEDROL) injection  40 mg Intravenous Q12H  . metoprolol tartrate  25 mg Oral BID  . pantoprazole  40 mg Oral Daily  . pramipexole  0.25 mg Oral QHS   Continuous Infusions: . sodium chloride    . sodium chloride     PRN Meds: sodium chloride, sodium chloride, albuterol, heparin, heparin, lidocaine (PF), lidocaine-prilocaine, pentafluoroprop-tetrafluoroeth  Allergies:    Allergies  Allergen Reactions  . Advair Hfa [Fluticasone-Salmeterol] Other (See Comments)    Developed thrush, although the mouth WAS being rinsed as directed  . Penicillins Rash    Has patient had a PCN reaction causing immediate rash, facial/tongue/throat swelling, SOB or lightheadedness with hypotension: No Has patient had a PCN reaction causing severe rash involving mucus membranes or skin necrosis: No Has patient had a PCN reaction that required hospitalization: No Has patient had a PCN reaction occurring within the last 10 years: No If all of the above answers are "NO", then may proceed with Cephalosporin use.     Social History:   Social History   Socioeconomic History  . Marital status: Married    Spouse name: Not on file  . Number of children: 2  . Years of education: Not on file  . Highest education  level: Not on file  Occupational History    Comment: retired  Tobacco Use  . Smoking status: Former Smoker    Packs/day: 0.50    Years: 54.00    Pack years: 27.00    Quit date: 09/17/2017    Years since quitting: 3.0  . Smokeless tobacco: Never Used  Vaping Use  . Vaping Use: Never used  Substance and Sexual Activity  . Alcohol use: Not Currently    Comment: occasional  . Drug use: No  . Sexual activity: Not Currently  Other Topics Concern  . Not on file  Social History Narrative  . Not on file   Social Determinants of Health   Financial Resource Strain:   . Difficulty of Paying Living Expenses: Not on file  Food Insecurity:   . Worried About Charity fundraiser in the Last Year: Not on file  . Ran Out of Food in the Last Year: Not on file  Transportation Needs:   .  Lack of Transportation (Medical): Not on file  . Lack of Transportation (Non-Medical): Not on file  Physical Activity:   . Days of Exercise per Week: Not on file  . Minutes of Exercise per Session: Not on file  Stress:   . Feeling of Stress : Not on file  Social Connections:   . Frequency of Communication with Friends and Family: Not on file  . Frequency of Social Gatherings with Friends and Family: Not on file  . Attends Religious Services: Not on file  . Active Member of Clubs or Organizations: Not on file  . Attends Archivist Meetings: Not on file  . Marital Status: Not on file  Intimate Partner Violence:   . Fear of Current or Ex-Partner: Not on file  . Emotionally Abused: Not on file  . Physically Abused: Not on file  . Sexually Abused: Not on file    Family History:    Family History  Problem Relation Age of Onset  . Stroke Brother   . Lung cancer Sister 8       lung cancer/former  . Colon cancer Neg Hx   . Colon polyps Neg Hx      ROS:  Please see the history of present illness.  All other ROS reviewed and negative.     Physical Exam/Data:   Vitals:   09/17/20 1358  09/17/20 1400 09/17/20 1402 09/17/20 1415  BP: 104/69 118/82  118/82  Pulse: (!) 118 (!) 119  (!) 119  Resp: (!) 26 (!) 29  (!) 29  Temp:      TempSrc:      SpO2:   95% 95%   No intake or output data in the 24 hours ending 09/17/20 1449 Last 3 Weights 07/27/2020 07/21/2020 06/09/2020  Weight (lbs) 151 lb 14.4 oz 151 lb 14.4 oz 154 lb 3.2 oz  Weight (kg) 68.9 kg 68.901 kg 69.945 kg     There is no height or weight on file to calculate BMI.  General:  Well nourished, well developed, in no acute distress HEENT: normal Lymph: no adenopathy Neck: elevated JVD Endocrine:  No thryomegaly Vascular: No carotid bruits; FA pulses 2+ bilaterally without bruits  Cardiac:  Regular, tachy, no m/r/g, Lungs:  Mild crackles bilateral bases Abd: soft, nontender, no hepatomegaly  Ext: no edema Musculoskeletal:  No deformities, BUE and BLE strength normal and equal Skin: warm and dry  Neuro:  CNs 2-12 intact, no focal abnormalities noted Psych:  Normal affect     Laboratory Data:  High Sensitivity Troponin:   Recent Labs  Lab 09/16/20 1649 09/16/20 1857  TROPONINIHS 79* 78*     Chemistry Recent Labs  Lab 09/16/20 1636 09/17/20 0524  NA 134* 134*  K 4.1 4.7  CL 94* 95*  CO2 29 26  GLUCOSE 118* 88  BUN 20 30*  CREATININE 3.76* 4.96*  CALCIUM 8.7* 8.6*  GFRNONAA 16* 12*  ANIONGAP 11 13    Recent Labs  Lab 09/17/20 0524  PROT 7.2  ALBUMIN 2.7*  AST 346*  ALT 196*  ALKPHOS 107  BILITOT 0.7   Hematology Recent Labs  Lab 09/16/20 1636 09/17/20 0524  WBC 8.5 7.3  RBC 2.66* 2.39*  HGB 8.4* 7.6*  HCT 26.4* 23.6*  MCV 99.2 98.7  MCH 31.6 31.8  MCHC 31.8 32.2  RDW 15.9* 15.7*  PLT 221 183   BNP Recent Labs  Lab 09/16/20 1636  BNP >4,500.0*    DDimer No results for input(s): DDIMER in  the last 168 hours.   Radiology/Studies:  CT Chest Wo Contrast  Result Date: 09/16/2020 CLINICAL DATA:  COPD exacerbation. Intermittent shortness of breath for 2 weeks. Patient  with history of small cell lung cancer. EXAM: CT CHEST WITHOUT CONTRAST TECHNIQUE: Multidetector CT imaging of the chest was performed following the standard protocol without IV contrast. COMPARISON:  Radiograph earlier today. Most recent chest CT 07/20/2020 FINDINGS: Cardiovascular: Chronic cardiomegaly. Coronary artery calcifications. No pericardial effusion. There is diffuse aortic atherosclerosis and tortuosity. Focal dilatation of the proximal transverse aorta measuring 4.6 cm, unchanged. Focal dilatation at the diaphragmatic hiatus of 4.1 cm unchanged. There is no periaortic stranding. Mediastinum/Nodes: Paucity of mediastinal fat lack of IV contrast limits detailed assessment. There are multiple small mediastinal nodes are unchanged from prior exam and not enlarged by size criteria. Post treatment changes in the left hilar region, grossly stable. Hilar assessment limited in the absence of IV contrast. No esophageal wall thickening. No evidence of thyroid nodule. Lungs/Pleura: Advanced emphysema. Left paramediastinal post treatment related changes with volume loss and ill-defined opacity, stable. Unchanged partially loculated left pleural effusion. Multiple pulmonary nodules. Right upper lobe nodule measures 2 mm, series 4, image 31, previously 4 mm. Subpleural right upper lobe pulmonary nodule measures 7 mm, series 4, image 37, unchanged or slightly decreased. Continued decreased size of a linear subpleural opacity in the left lower lobe, series 4, image 79, with no definite AP component to measure, 10 mm transverse, previously 12 mm. Chronic fissural thickening in the right minor fissure, unchanged. There is no new pulmonary nodule. No evidence of acute airspace disease. No findings of pulmonary edema. Small right pleural effusion on prior has diminished, minimal pleural thickening inferiorly. No endobronchial lesion. Upper Abdomen: No acute findings. Musculoskeletal: There are no acute or suspicious osseous  abnormalities. No evidence of focal bone lesion. IMPRESSION: 1. No acute findings. 2. Emphysema with post treatment related changes in the left lung, not significantly changed from September 2021 chest CT. 3. Stable small left pleural effusion that is partially loculated. Previous right pleural effusion is diminished. 4. Multiple pulmonary nodules, stable or slightly decreased in size from prior exam. No new pulmonary nodule or mass. 5. Stable cardiomegaly. Stable aortic atherosclerosis with proximal transverse aortic aneurysm of 4.6 cm. No evidence of acute aortic abnormality or inflammation. Recommend semiannual imaging with CTA or MRA. Aortic Atherosclerosis (ICD10-I70.0) and Emphysema (ICD10-J43.9). Electronically Signed   By: Keith Rake M.D.   On: 09/16/2020 18:54   US Abdomen Limited  Result Date: 09/17/2020 CLINICAL DATA:  Elevated LFTs. EXAM: ULTRASOUND ABDOMEN LIMITED RIGHT UPPER QUADRANT COMPARISON:  CT AP 04/17/2019 FINDINGS: Note: Exam was performed in the nonfasting state. Patient ate breakfast at 10 a.m. Gallbladder: Partially collapsed gallbladder is identified with mild wall thickening measuring 4 mm. No gallbladder sludge, stones. Negative sonographic Murphy's sign. No pericholecystic fluid. Common bile duct: Diameter: 4 mm Liver: No focal lesion identified. Within normal limits in parenchymal echogenicity. Portal vein is patent on color Doppler imaging with normal direction of blood flow towards the liver. Other: Right pleural effusion and multiple right renal cysts are again noted. IMPRESSION: 1. Mild gallbladder wall thickening measuring 4 mm in thickness. This is a nonspecific finding in the nonfasting state and may reflect incomplete distension. No gallstones, gallbladder sludge or pericholecystic fluid. If there is a clinical concern for acute cholecystitis consider further investigation with nuclear medicine hepatic biliary scan. 2. Incidental note of right pleural effusion and  right kidney cysts. Electronically Signed  By: Kerby Moors M.D.   On: 09/17/2020 12:16   DG Chest Portable 1 View  Result Date: 09/16/2020 CLINICAL DATA:  74 year old male with shortness of breath for 2 weeks. Abnormal EKG. EXAM: PORTABLE CHEST 1 VIEW COMPARISON:  Chest radiographs 09/09/2020 and earlier - including chest CT 07/20/2020. FINDINGS: Portable AP upright view at 1645 hours. Stable cardiomegaly and mediastinal contours. Continued dense retrocardiac opacification, left pleural effusion demonstrated in September. Superimposed chronic left perihilar radiation changes to the lung. Underlying bilateral chronic increased interstitial markings, emphysema demonstrated on prior CT. No overt edema. No pneumothorax. Stable visualized osseous structures. IMPRESSION: 1. Stable ventilation since 09/09/2020. 2. Continued dense left lung base opacity likely representing a combination of pleural effusion and atelectasis. 3. Chronic cardiomegaly, emphysema and radiation pulmonary changes. Electronically Signed   By: Genevie Ann M.D.   On: 09/16/2020 17:21   ECHOCARDIOGRAM COMPLETE  Result Date: 09/17/2020    ECHOCARDIOGRAM REPORT   Patient Name:   [name] Date of Exam: 09/17/2020 Medical Rec #:  [id number]       Height:       69.0 in Accession #:    [id number]      Weight:       151.9 lb Date of Birth:  [birth year]      BSA:          1.838 m Patient Age:    42 years        BP:           108/74 mmHg Patient Gender: M               HR:           108 bpm. Exam Location:  [facility] Procedure: 2D Echo Indications:    CHF-Acute Systolic 751.70 / Y17.49  History:        Patient has no prior history of Echocardiogram examinations.                 Risk Factors:Dyslipidemia and Former Smoker. ESRD, Small Cell                 Lung Cancer, Elevated Troponin.  Sonographer:    Leavy Cella RDCS (AE) Referring Phys: [id number] OLADAPO ADEFESO IMPRESSIONS  1. Left ventricular ejection fraction, by estimation, is 15-20%. The  left ventricle has severely decreased function. The left ventricle demonstrates global hypokinesis. The left ventricular internal cavity size was mildly dilated. Left ventricular diastolic parameters are consistent with Grade III diastolic dysfunction (restrictive). Elevated left atrial pressure.  2. Right ventricular systolic function is mildly reduced. The right ventricular size is normal.  3. Left atrial size was severely dilated.  4. Right atrial size was severely dilated.  5. Large pleural effusion in the left lateral region.  6. The mitral valve is normal in structure. Mild mitral valve regurgitation. No evidence of mitral stenosis.  7. The aortic valve is tricuspid. Aortic valve regurgitation is not visualized. No aortic stenosis is present.  8. The inferior vena cava is dilated in size with >50% respiratory variability, suggesting right atrial pressure of 8 mmHg. FINDINGS  Left Ventricle: LV global hypokinesis, the anteroseptal wall is akinetic. Left ventricular ejection fraction, by estimation, is 15-20%. The left ventricle has severely decreased function. The left ventricle demonstrates global hypokinesis. The left ventricular internal cavity size was mildly dilated. There is no left ventricular hypertrophy. Left ventricular diastolic parameters are consistent with Grade III diastolic dysfunction (restrictive). Elevated left atrial pressure. Right Ventricle: The  right ventricular size is normal. Right vetricular wall thickness was not well visualized. Right ventricular systolic function is mildly reduced. Left Atrium: Left atrial size was severely dilated. Right Atrium: Right atrial size was severely dilated. Pericardium: There is no evidence of pericardial effusion. Mitral Valve: The mitral valve is normal in structure. Mild mitral valve regurgitation. No evidence of mitral valve stenosis. Tricuspid Valve: The tricuspid valve is normal in structure. Tricuspid valve regurgitation is mild . No evidence of  tricuspid stenosis. Aortic Valve: The aortic valve is tricuspid. Aortic valve regurgitation is not visualized. No aortic stenosis is present. Aortic valve mean gradient measures 5.7 mmHg. Aortic valve peak gradient measures 13.1 mmHg. Aortic valve area, by VTI measures 1.69  cm. Pulmonic Valve: The pulmonic valve was not well visualized. Pulmonic valve regurgitation is not visualized. No evidence of pulmonic stenosis. Aorta: The aortic root is normal in size and structure. Pulmonary Artery: Mild pulmonary HTN, PASP is 27 mmHg. Venous: The inferior vena cava is dilated in size with greater than 50% respiratory variability, suggesting right atrial pressure of 8 mmHg. IAS/Shunts: The interatrial septum was not well visualized. Additional Comments: There is a large pleural effusion in the left lateral region.  LEFT VENTRICLE PLAX 2D LVIDd:         6.37 cm  Diastology LVIDs:         5.91 cm  LV e' medial:    3.81 cm/s LV PW:         1.24 cm  LV E/e' medial:  27.6 LV IVS:        1.02 cm  LV e' lateral:   9.65 cm/s LVOT diam:     2.30 cm  LV E/e' lateral: 10.9 LV SV:         41 LV SV Index:   22 LVOT Area:     4.15 cm  RIGHT VENTRICLE RV S prime:     6.97 cm/s LEFT ATRIUM              Index       RIGHT ATRIUM           Index LA diam:        4.20 cm  2.29 cm/m  RA Area:     28.20 cm LA Vol (A2C):   101.0 ml 54.95 ml/m RA Volume:   113.00 ml 61.48 ml/m LA Vol (A4C):   78.4 ml  42.65 ml/m LA Biplane Vol: 89.9 ml  48.91 ml/m  AORTIC VALVE AV Area (Vmax):    1.66 cm AV Area (Vmean):   1.74 cm AV Area (VTI):     1.69 cm AV Vmax:           180.98 cm/s AV Vmean:          111.041 cm/s AV VTI:            0.242 m AV Peak Grad:      13.1 mmHg AV Mean Grad:      5.7 mmHg LVOT Vmax:         72.41 cm/s LVOT Vmean:        46.635 cm/s LVOT VTI:          0.099 m LVOT/AV VTI ratio: 0.41  AORTA Ao Root diam: 3.50 cm MITRAL VALVE                TRICUSPID VALVE MV Area (PHT): 4.80 cm     TR Peak grad:   28.9 mmHg MV Decel Time: 158  msec     TR Vmax:        269.00 cm/s MR Peak grad: 59.3 mmHg MR Mean grad: 42.0 mmHg     SHUNTS MR Vmax:      385.00 cm/s   Systemic VTI:  0.10 m MR Vmean:     317.0 cm/s    Systemic Diam: 2.30 cm MV E velocity: 105.00 cm/s Carlyle Dolly MD Electronically signed by Carlyle Dolly MD Signature Date/Time: 09/17/2020/1:35:20 PM    Final      Assessment and Plan:   1. Acute systolic heart failure - presents with SOB, BNP markedly elevated >4500. No LE edema but +JVD, elevated LFTs may be related to hepatic congestion - he makes no urine, defer fluid removal with HD to nephrology - he does report some occasional low bp's with HD, will limit CHF meds - change lopressor to torpol 25mg  daily. No ACE/ARB/ARNI/aldactone at this time, follow bp's especially with HD - prior history of CAD with stent, will need LHC/RHC once more euvolemic.  - TSH pending - with tachycardia would avoid aggressive titration of beta blocker until we know his CO by RHC   2. HTN - stop home norvasc. Change lopressor to toprol 25mg  daily - pending bp's consider ACE/ARB/ARNI   3. Limited stage small cell lung cancer - completed systemic chemotherapy with carboplatin and etoposide status post 4 cycles. - from last onc note: " His scan showed no concerning findings for disease progression of her metastasis. I recommended for the patient to continue on observation with repeat CT scan of the chest in 6 months"  4. Anemia - per primary team, down to 7.6 today  5. Elevated LFTs - could potentially be due to hepatic congestion  6. History of CAD - prior stent to LCX in 1997, no interventions since that time.  - with drop in LVEF will need repeat cath  7. Tachycardia -   Looks like sinus tachcyardia, such a broad T wave difficult to regularly see P waves - repeat ekg.   Would optimize fluid status over the weekend, monitor Hgb. Likely would send for cath on rounds Monday pending clinical status. Please make npo Sunday  night.    For questions or updates, please contact [facility] Please consult [email or web address] for contact info under    Signed, Carlyle Dolly, MD  09/17/2020 2:49 PM

## 2020-09-17 NOTE — Progress Notes (Signed)
*  PRELIMINARY RESULTS* Echocardiogram 2D Echocardiogram has been performed.  Leavy Cella 09/17/2020, 1:00 PM

## 2020-09-17 NOTE — TOC Initial Note (Addendum)
Transition of Care [facility]) - Initial/Assessment Note   Patient Details  Name: Jeremy Johnson MRN: [id number] Date of Birth: [birth year]  [facility] CM/SW Contact:    Sherie Don, LCSW Phone Number: 09/17/2020, 3:13 PM  Clinical Narrative: Patient is a 74 year old male who was admitted for acute CHF. Patient has a history of hypothyroidism,  HLD, hypertension, GERD, small cell lung cancer, ESRD, and chronic obstructive pulmonary disease/emphysema. Readmission checklist completed due to high readmission score.  CSW spoke with patient's wife, Kreg Earhart, per patient's request to complete assessment. Per wife, patient resides at home with her and is independent with his ADLs. Patient's only DME is home O2 through [place], which he is on 3L/min at night. Wife reported he is not currently active with [facility] services. Patient is able to afford his medications each month, which he takes as prescribed. Wife reported no issues with transportation to dialysis. TOC to follow for discharge needs.  Expected Discharge Plan: Home/Self Care Barriers to Discharge: Continued Medical Work up  Patient Goals and CMS Choice Patient states their goals for this hospitalization and ongoing recovery are:: Discharge home when medically stabel CMS Medicare.gov Compare Post Acute Care list provided to:: Patient Choice offered to / list presented to : Patient  Expected Discharge Plan and Services Expected Discharge Plan: Home/Self Care In-house Referral: Clinical Social Work Discharge Planning Services: NA Living arrangements for the past 2 months: Single Family Home           DME Arranged: N/A DME Agency: NA HH Arranged: NA [place] Agency: NA  Prior Living Arrangements/Services Living arrangements for the past 2 months: [place] with:: Spouse Patient language and need for interpreter reviewed:: Yes Do you feel safe going back to the place where you live?: Yes      Need for  Family Participation in Patient Care: No (Comment) Care giver support system in place?: Yes (comment) Current home services: DME (O2 from [place]) Criminal Activity/Legal Involvement Pertinent to Current Situation/Hospitalization: No - Comment as needed  Permission Sought/Granted Permission sought to share information with : Family Supports Permission granted to share information with : Yes, Verbal Permission Granted Share Information with NAME: Olis Viverette Permission granted to share info w Relationship: Wife  Emotional Assessment Appearance:: Appears stated age Attitude/Demeanor/Rapport: Engaged Affect (typically observed): Accepting Orientation: : Oriented to Self, Oriented to Place, Oriented to  Time, Oriented to Situation Alcohol / Substance Use: Not Applicable Psych Involvement: No (comment)  Admission diagnosis:  Acute exacerbation of CHF (congestive heart failure) ([place]) [I50.9] Patient Active Problem List   Diagnosis Date Noted  . Restless leg syndrome 09/17/2020  . Elevated troponin I level 09/17/2020  . Acute CHF (congestive heart failure) ([place]) 09/16/2020  . Loss of weight 06/09/2020  . Early satiety 06/09/2020  . Closed displaced fracture of right femoral neck with delayed healing 04/29/2020  . Closed displaced fracture of right femoral neck ([place]) 04/29/2020  . Acute respiratory failure with hypoxia ([place]) 04/06/2020  . Acute and chronic respiratory failure with hypoxia ([place]) 04/05/2020  . Chronic obstructive pulmonary disease/emphysema   . Anaphylactic reaction due to adverse effect of correct drug or medicament properly administered, initial encounter 07/30/2019  . Hypocalcemia 05/31/2019  . Other disorders of phosphorus metabolism 05/31/2019  . Pancytopenia ([place]) 05/22/2019  . ESRD (end stage renal disease) on dialysis ([place]) 05/22/2019  . Unspecified protein-calorie malnutrition ([place]) 05/02/2019  . Coagulation defect, unspecified ([place]) 04/26/2019  .  Diarrhea, unspecified  04/26/2019  . Shortness of breath 04/26/2019  . Hypokalemia 04/26/2019  . Pain, unspecified 04/26/2019  . Pruritus, unspecified 04/26/2019  . Secondary hyperparathyroidism of renal origin ([place]) 04/26/2019  . Encounter for immunization 04/26/2019  . Incisional hernia without obstruction or gangrene 04/25/2019  . Malignant neoplasm of unspecified part of unspecified bronchus or lung ([place]) 04/25/2019  . Other specified degenerative diseases of nervous system ([place]) 04/25/2019  . Anemia in chronic kidney disease 04/25/2019  . Atherosclerotic heart disease of native coronary artery without angina pectoris 04/25/2019  . Gastro-esophageal reflux disease without esophagitis 04/25/2019  . Hypothyroidism, unspecified 04/25/2019  . Acute renal failure superimposed on stage 3 chronic kidney disease ([place]) 04/16/2019  . Anemia in chronic kidney disease (CKD) 04/14/2019  . Small cell lung cancer ([place]) 02/06/2019  . Encounter for antineoplastic chemotherapy 02/06/2019  . Goals of care, counseling/discussion 02/06/2019  . Special screening for malignant neoplasms, colon   . GERD (gastroesophageal reflux disease)   . Hyperlipidemia   . Gout   . Hypothyroidism   . Degenerative joint disease (DJD) of lumbar spine   . Acquired hypothyroidism 02/02/2010  . HLD (hyperlipidemia) 02/02/2010  . Essential hypertension 02/02/2010  . GERD 02/02/2010  . ABDOMINAL AORTIC ANEURYSM REPAIR, HX OF 02/02/2010   PCP:  [name], MD Pharmacy:   [facility] [address] [place] [address] Phone: [phone] Fax: [phone]  Readmission Risk Interventions Readmission Risk Prevention Plan 09/17/2020 05/03/2020  Transportation Screening Complete Complete  Medication Review Press photographer) Complete Complete  PCP or Specialist appointment within 3-5 days of discharge - Complete  HRI or Home Care  Consult Complete Complete  SW Recovery Care/Counseling Consult Complete Complete  Palliative Care Screening Not Applicable Complete  [place] Not Applicable Complete  Some recent data might be hidden

## 2020-09-17 NOTE — H&P (Signed)
History and Physical  Jeremy Johnson OVZ:[id number] DOB: [birth year] DOA: 09/16/2020  Referring physician: Milton Ferguson, MD PCP: [name], MD  Patient coming from: Home  Chief Complaint: Shortness of breath  HPI: Jeremy Johnson is a 74 y.o. male with medical history significant for ESRD(T-T-S)at [facility], coronary artery disease, COPD, hypertension, hyperlipidemia, small cell lung cancer and hypothyroidism who presents to the emergency department due to about 3 month history of intermittent shortness of breath unrelated to exertion, this progressively worsened within last 1 week, he states that he usually checked his O2 sats whenever he has the shortness of breath episode and though his O2 sat was usually in the low 90s, he wonders why he was so short of breath.  He had dialysis today and on returning home he had a recurrence of the shortness of breath with O2 sats in low 90s, so he decided to go to the ED for further evaluation of the shortness of breath.  He denies fever, chills, chest pain, nausea, vomiting or abdominal pain.  ED Course: In the emergency department, he was tachycardic and intermittently tachypneic, O2 sat was 90-100% on room air.  Work-up in the ED showed normocytic anemia, BUN to creatinine 20/3.76, BNP > 4500, high-sensitivity troponin I  x2-79>78.  CT chest without contrast showed no acute findings.  Chest x-ray showed continued dense left lung base opacity likely representing the combination of pleural effusion and atelectasis.  Cardiology was consulted by ED physician and recommended admitting patient to [facility] with consult for cardiology and nephrology to see patient in the morning.  Hospitalist was asked to admit patient for further evaluation and management.  Review of Systems: Constitutional: Negative for chills and fever.  HENT: Negative for ear pain and sore throat.   Eyes: Negative for pain and visual disturbance.  Respiratory: Positive for shortness of breath.   Negative for cough and chest tightness  Cardiovascular: Negative for chest pain and palpitations.  Gastrointestinal: Negative for abdominal pain and vomiting.  Endocrine: Negative for polyphagia and polyuria.  Genitourinary: Positive for renal failure and on dialysis.   Musculoskeletal: Negative for arthralgias and back pain.  Skin: Negative for color change and rash.  Allergic/Immunologic: Negative for immunocompromised state.  Neurological: Negative for tremors, syncope, speech difficulty, weakness, light-headedness and headaches.  Hematological: Does not bruise/bleed easily.  All other systems reviewed and are negative    Past Medical History:  Diagnosis Date  . Anemia   . Blood transfusion without reported diagnosis   . CAD (coronary artery disease)    STENT... MID CIRCUMFLEX...1997  . Chronic kidney disease    STAGE 3  . COPD (chronic obstructive pulmonary disease) ([place])   . Degenerative joint disease (DJD) of lumbar spine   . GERD (gastroesophageal reflux disease)   . Gout   . Hyperlipidemia   . Hypertension   . Hypothyroidism   . Incisional hernia    abdomen  . Leukocytosis    CHRONIC MILD  . Myocardial infarction ([place])    1997  . SCL CA dx'd 01/2019   Lung cancer   Past Surgical History:  Procedure Laterality Date  . ABDOMINAL AORTIC ANEURYSM REPAIR  2006  . AV FISTULA PLACEMENT Left 04/25/2019   Procedure: ARTERIOVENOUS (AV) FISTULA CREATION LEFT ARM;  Surgeon: Angelia Mould, MD;  Location: [facility];  Service: Vascular;  Laterality: Left;  . AV FISTULA PLACEMENT Left 05/19/2019   Procedure: CONVERSION OF LEFT ARM ARTERIOVENOUS FISTULA TO GRAFT;  Surgeon: Angelia Mould, MD;  Location[facility];  Service: Vascular;  Laterality: Left;  . BIOPSY  09/30/2018   Procedure: BIOPSY;  Surgeon: Danie Binder, MD;  [facility];  Service: Endoscopy;;  ascending colon  . BIOPSY  07/27/2020   Procedure: BIOPSY;  Surgeon: Eloise Harman, DO;  [facility];  Service: Endoscopy;;  gastric  . COLONOSCOPY  2008  . COLONOSCOPY N/A 09/30/2018   External and internal hemorrhoids, six polyps removed, one ascending colon polypoid lesion biopsied. Six simple adenomas and one benign polypoid lesion. Colonoscopy Nov 2022.   [name] COLONOSCOPY WITH PROPOFOL N/A 07/27/2020   Procedure: COLONOSCOPY WITH PROPOFOL;  Surgeon: Eloise Harman, DO;  [facility];  Service: Endoscopy;  Laterality: N/A;  11:15am  . CORONARY ANGIOPLASTY WITH STENT PLACEMENT  1997   MID CIRCUMFLEX  . ESOPHAGOGASTRODUODENOSCOPY (EGD) WITH PROPOFOL N/A 07/27/2020   Procedure: ESOPHAGOGASTRODUODENOSCOPY (EGD) WITH PROPOFOL;  Surgeon: Eloise Harman, DO;  [facility];  Service: Endoscopy;  Laterality: N/A;  . HIP ARTHROPLASTY Right 04/30/2020   Procedure: ARTHROPLASTY  HIP (HEMIARTHROPLASTY);  Surgeon: Altamese Leon, MD;  Location: [facility];  Service: Orthopedics;  Laterality: Right;  . IR FLUORO GUIDE CV LINE RIGHT  04/22/2019  . IR FLUORO GUIDE CV LINE RIGHT  05/01/2020  . IR THROMBECTOMY AV FISTULA W/THROMBOLYSIS/PTA INC/SHUNT/IMG LEFT Left 05/03/2020  . IR US GUIDE VASC ACCESS LEFT  05/03/2020  . IR US GUIDE VASC ACCESS RIGHT  04/22/2019  . IR US GUIDE VASC ACCESS RIGHT  05/01/2020  . POLYPECTOMY  09/30/2018   Procedure: POLYPECTOMY;  Surgeon: Danie Binder, MD;  [facility];  Service: Endoscopy;;  colon  . POLYPECTOMY  07/27/2020   Procedure: POLYPECTOMY;  Surgeon: Eloise Harman, DO;  [facility];  Service: Endoscopy;;  . VIDEO BRONCHOSCOPY WITH ENDOBRONCHIAL NAVIGATION N/A 01/27/2019   Procedure: VIDEO BRONCHOSCOPY WITH ENDOBRONCHIAL NAVIGATION;  Surgeon: Grace Isaac, MD;  Location: [facility];  Service: Thoracic;  Laterality: N/A;  . VIDEO BRONCHOSCOPY WITH ENDOBRONCHIAL ULTRASOUND N/A 01/27/2019   Procedure: VIDEO BRONCHOSCOPY WITH ENDOBRONCHIAL ULTRASOUND;  Surgeon: Grace Isaac, MD;  Location: [facility];  Service:  Thoracic;  Laterality: N/A;    Social History:  reports that he quit smoking about 3 years ago. He has a 27.00 pack-year smoking history. He has never used smokeless tobacco. He reports previous alcohol use. He reports that he does not use drugs.   Allergies  Allergen Reactions  . Advair Hfa [Fluticasone-Salmeterol] Other (See Comments)    Developed thrush, although the mouth WAS being rinsed as directed  . Penicillins Rash    Has patient had a PCN reaction causing immediate rash, facial/tongue/throat swelling, SOB or lightheadedness with hypotension: No Has patient had a PCN reaction causing severe rash involving mucus membranes or skin necrosis: No Has patient had a PCN reaction that required hospitalization: No Has patient had a PCN reaction occurring within the last 10 years: No If all of the above answers are "NO", then may proceed with Cephalosporin use.     Family History  Problem Relation Age of Onset  . Stroke Brother   . Lung cancer Sister 76       lung cancer/former  . Colon cancer Neg Hx   . Colon polyps Neg Hx     Prior to Admission medications   Medication Sig Start Date End Date Taking? Authorizing Provider  acetaminophen (TYLENOL) 325 MG tablet Take 2 tablets (650 mg total) by  mouth every 6 (six) hours as needed for mild pain (or Fever >/= 101). Patient taking differently: Take 1,300 mg by mouth every 6 (six) hours as needed for mild pain or headache (or Fever >/= 101). Arthritis strength 05/25/19  Yes Swayze, Ava, DO  albuterol (VENTOLIN HFA) 108 (90 Base) MCG/ACT inhaler Inhale 2 puffs into the lungs every 6 (six) hours as needed for wheezing or shortness of breath.  02/02/20  Yes [provider]  allopurinol (ZYLOPRIM) 100 MG tablet Take 1 tablet (100 mg total) by mouth daily. Patient taking differently: Take 100 mg by mouth in the morning.  04/26/19  Yes Mercy Riding, MD  amLODipine (NORVASC) 10 MG tablet Take 10 mg by mouth at bedtime.  08/16/19  Yes  [provider]  aspirin EC 81 MG tablet Take 81 mg by mouth at bedtime.   Yes [provider]  B Complex-C-Zn-Folic Acid (DIALYVITE 875-IEPP 15) 0.8 MG TABS Take 1 tablet by mouth daily. 04/22/20  Yes [provider]  Cyanocobalamin (VITAMIN B 12) 500 MCG TABS Take 1,000 mg by mouth daily.   Yes [provider]  Darbepoetin Alfa (ARANESP) 100 MCG/0.5ML SOSY injection Inject 0.5 mLs (100 mcg total) into the vein every Thursday with hemodialysis. 05/01/19  Yes Wendee Beavers T, MD  iron sucrose in sodium chloride 0.9 % 100 mL 50 mg. 04/06/20 04/05/21 Yes [provider]  levothyroxine (SYNTHROID, LEVOTHROID) 175 MCG tablet Take 175 mcg by mouth daily before breakfast.    Yes [provider]  lidocaine-prilocaine (EMLA) cream SMARTSIG:1 Topical Every Night 08/28/20  Yes [provider]  Methoxy PEG-Epoetin Beta (MIRCERA IJ) Inject 30 mg into the vein every 28 (twenty-eight) days.  03/23/20 03/22/21 Yes [provider]  metoprolol tartrate (LOPRESSOR) 50 MG tablet Take 25 mg by mouth 2 (two) times daily.    Yes [provider]  omeprazole (PRILOSEC) 40 MG capsule Take 1 capsule (40 mg total) by mouth in the morning and at bedtime. 07/27/20 09/16/20 Yes Carver, Charles K, DO  rosuvastatin (CRESTOR) 10 MG tablet Take 10 mg by mouth at bedtime. 01/30/20  Yes [provider]  Calcipotriene 0.005 % FOAM Apply to to body bid Patient not taking: Reported on 09/16/2020 08/05/20   Ralene Bathe, MD  pramipexole (MIRAPEX) 0.25 MG tablet Take 0.25 mg by mouth at bedtime. Patient not taking: Reported on 09/16/2020 03/31/20   [provider]  triamcinolone cream (KENALOG) 0.1 % Apply to aa's rash BID until clear then PRN thereafter. Avoid face, groin, and axilla. Patient not taking: Reported on 09/16/2020 06/09/20   Ralene Bathe, MD    Physical Exam: BP 106/74   Pulse (!) 110   Temp 98 F (36.7 C) (Oral)   Resp 15    SpO2 100%   . General: 74 y.o. year-old male well developed well nourished in no acute distress.  Alert and oriented x3. [name] HEENT: NCAT, EOMI . Neck: Supple, trachea medial . Cardiovascular: Regular rate and rhythm with no rubs or gallops.  No thyromegaly or JVD noted.  No lower extremity edema. 2/4 pulses in all 4 extremities. [name] Respiratory: Clear to auscultation with no wheezes or rales. Good inspiratory effort. . Abdomen: Soft nontender nondistended with normal bowel sounds x4 quadrants. . Muskuloskeletal: No cyanosis, clubbing or edema noted bilaterally . Neuro: CN II-XII intact, strength, sensation, reflexes . Skin: No ulcerative lesions noted or rashes . Psychiatry: Judgement and insight appear normal. Mood is appropriate for condition and  setting          Labs on Admission:  Basic Metabolic Panel: Recent Labs  Lab 09/16/20 1636  NA 134*  K 4.1  CL 94*  CO2 29  GLUCOSE 118*  BUN 20  CREATININE 3.76*  CALCIUM 8.7*   Liver Function Tests: No results for input(s): AST, ALT, ALKPHOS, BILITOT, PROT, ALBUMIN in the last 168 hours. No results for input(s): LIPASE, AMYLASE in the last 168 hours. No results for input(s): AMMONIA in the last 168 hours. CBC: Recent Labs  Lab 09/16/20 1636  WBC 8.5  HGB 8.4*  HCT 26.4*  MCV 99.2  PLT 221   Cardiac Enzymes: No results for input(s): CKTOTAL, CKMB, CKMBINDEX, TROPONINI in the last 168 hours.  BNP (last 3 results) Recent Labs    09/16/20 1636  BNP >4,500.0*    ProBNP (last 3 results) No results for input(s): PROBNP in the last 8760 hours.  CBG: No results for input(s): GLUCAP in the last 168 hours.  Radiological Exams on Admission: CT Chest Wo Contrast  Result Date: 09/16/2020 CLINICAL DATA:  COPD exacerbation. Intermittent shortness of breath for 2 weeks. Patient with history of small cell lung cancer. EXAM: CT CHEST WITHOUT CONTRAST TECHNIQUE: Multidetector CT imaging of the chest was performed following the  standard protocol without IV contrast. COMPARISON:  Radiograph earlier today. Most recent chest CT 07/20/2020 FINDINGS: Cardiovascular: Chronic cardiomegaly. Coronary artery calcifications. No pericardial effusion. There is diffuse aortic atherosclerosis and tortuosity. Focal dilatation of the proximal transverse aorta measuring 4.6 cm, unchanged. Focal dilatation at the diaphragmatic hiatus of 4.1 cm unchanged. There is no periaortic stranding. Mediastinum/Nodes: Paucity of mediastinal fat lack of IV contrast limits detailed assessment. There are multiple small mediastinal nodes are unchanged from prior exam and not enlarged by size criteria. Post treatment changes in the left hilar region, grossly stable. Hilar assessment limited in the absence of IV contrast. No esophageal wall thickening. No evidence of thyroid nodule. Lungs/Pleura: Advanced emphysema. Left paramediastinal post treatment related changes with volume loss and ill-defined opacity, stable. Unchanged partially loculated left pleural effusion. Multiple pulmonary nodules. Right upper lobe nodule measures 2 mm, series 4, image 31, previously 4 mm. Subpleural right upper lobe pulmonary nodule measures 7 mm, series 4, image 37, unchanged or slightly decreased. Continued decreased size of a linear subpleural opacity in the left lower lobe, series 4, image 79, with no definite AP component to measure, 10 mm transverse, previously 12 mm. Chronic fissural thickening in the right minor fissure, unchanged. There is no new pulmonary nodule. No evidence of acute airspace disease. No findings of pulmonary edema. Small right pleural effusion on prior has diminished, minimal pleural thickening inferiorly. No endobronchial lesion. Upper Abdomen: No acute findings. Musculoskeletal: There are no acute or suspicious osseous abnormalities. No evidence of focal bone lesion. IMPRESSION: 1. No acute findings. 2. Emphysema with post treatment related changes in the left  lung, not significantly changed from September 2021 chest CT. 3. Stable small left pleural effusion that is partially loculated. Previous right pleural effusion is diminished. 4. Multiple pulmonary nodules, stable or slightly decreased in size from prior exam. No new pulmonary nodule or mass. 5. Stable cardiomegaly. Stable aortic atherosclerosis with proximal transverse aortic aneurysm of 4.6 cm. No evidence of acute aortic abnormality or inflammation. Recommend semiannual imaging with CTA or MRA. Aortic Atherosclerosis (ICD10-I70.0) and Emphysema (ICD10-J43.9). Electronically Signed   By: Keith Rake M.D.   On: 09/16/2020 18:54   DG Chest Portable 1 View  Result Date: 09/16/2020 CLINICAL DATA:  73 year old male with shortness of breath for 2 weeks. Abnormal EKG. EXAM: PORTABLE CHEST 1 VIEW COMPARISON:  Chest radiographs 09/09/2020 and earlier - including chest CT 07/20/2020. FINDINGS: Portable AP upright view at 1645 hours. Stable cardiomegaly and mediastinal contours. Continued dense retrocardiac opacification, left pleural effusion demonstrated in September. Superimposed chronic left perihilar radiation changes to the lung. Underlying bilateral chronic increased interstitial markings, emphysema demonstrated on prior CT. No overt edema. No pneumothorax. Stable visualized osseous structures. IMPRESSION: 1. Stable ventilation since 09/09/2020. 2. Continued dense left lung base opacity likely representing a combination of pleural effusion and atelectasis. 3. Chronic cardiomegaly, emphysema and radiation pulmonary changes. Electronically Signed   By: Genevie Ann M.D.   On: 09/16/2020 17:21    EKG: I independently viewed the EKG done and my findings are as followed: Sinus tachycardia at rate of 113 bpm.  LVH with QRS widening repolarization abnormality  Assessment/Plan Present on Admission: . Acquired hypothyroidism . HLD (hyperlipidemia) . Essential hypertension . GERD . Small cell lung cancer ([place]) .  Chronic obstructive pulmonary disease/emphysema  Active Problems:   Acquired hypothyroidism   HLD (hyperlipidemia)   Essential hypertension   GERD   Small cell lung cancer (HCC)   ESRD (end stage renal disease) on dialysis ([facility])   Shortness of breath   Chronic obstructive pulmonary disease/emphysema   Acute CHF (congestive heart failure) ([facility])   Restless leg syndrome   Elevated troponin I level   Shortness of breath secondary to presumed acute congestive heart failure and pleural effusion and possible atelectasis Patient complained of worsening intermittent shortness of breath which was not influenced by exertion BNP was 4500 (patient has history of ESRD on HD) Chest x-ray showed continued dense left lung base opacity likely representing the combination of pleural effusion and atelectasis.  Cardiology was consulted by ED physician and recommended consult with cardiology and nephrology in the morning IR will be consulted to determine need for possible thoracentesis Continue total input/output, daily weights and fluid restriction Continue incentive spirometry Continue Cardiac diet  Echocardiogram will be done in the morning  ESRD on HD(TTS) Last dialysis was on 09/16/2020 Nephrology was consulted  Coronary artery disease Patient denies chest pain Continue aspirin and Crestor per home regimen  Elevated troponin I level possibly secondary to type II demand ischemia Patient denies chest pain at this time Troponin1 79>78  COPD (not in acute exacerbation) Continue home albuterol  Essential hypertension (controlled) Metoprolol and amlodipine will be temporarily held at this time due to soft BP  Hypothyroidism Continue home Synthroid  Hyperlipidemia  Continue home Crestor  GERD Continue Protonix  Restless leg syndrome  Continue home Mirapex  Pulmonary nodules CT chest without contrast showed multiple pulmonary nodules, stable or slightly decreased in size from prior  exam. No new pulmonary nodule or mass noted.   DVT prophylaxis: SCDs (no indication for chemoprophylaxis at this time due to possible thoracentesis in the morning)  Code Status: Full code  Family Communication: None at bedside  Disposition Plan:  Patient is from:                        home Anticipated DC to:                   home Anticipated DC date:               2-3 days Anticipated DC barriers:  Patient was stable to be discharged home at this time due to shortness of breath secondary to presumed acute CHF, pleural effusion and atelectasis requiring cardiology, IR and nephrology consult   Consults called: Cardiology, nephrology, IR  Admission status: Inpatient    Bernadette Hoit MD [facility]  09/17/2020, 2:33 AM

## 2020-09-18 LAB — RENAL FUNCTION PANEL
Albumin: 2.7 g/dL — ABNORMAL LOW (ref 3.5–5.0)
Anion gap: 17 — ABNORMAL HIGH (ref 5–15)
BUN: 69 mg/dL — ABNORMAL HIGH (ref 8–23)
CO2: 21 mmol/L — ABNORMAL LOW (ref 22–32)
Calcium: 8.9 mg/dL (ref 8.9–10.3)
Chloride: 93 mmol/L — ABNORMAL LOW (ref 98–111)
Creatinine, Ser: 7.93 mg/dL — ABNORMAL HIGH (ref 0.61–1.24)
GFR, Estimated: 7 mL/min — ABNORMAL LOW (ref >60)
Glucose, Bld: 127 mg/dL — ABNORMAL HIGH (ref 70–99)
Phosphorus: 7.7 mg/dL — ABNORMAL HIGH (ref 2.5–4.6)
Potassium: 5.4 mmol/L — ABNORMAL HIGH (ref 3.5–5.1)
Sodium: 131 mmol/L — ABNORMAL LOW (ref 135–145)

## 2020-09-18 LAB — CBC
HCT: 23.9 % — ABNORMAL LOW (ref 39.0–52.0)
Hemoglobin: 7.8 g/dL — ABNORMAL LOW (ref 13.0–17.0)
MCH: 31.8 pg (ref 26.0–34.0)
MCHC: 32.6 g/dL (ref 30.0–36.0)
MCV: 97.6 fL (ref 80.0–100.0)
Platelets: 136 10*3/uL — ABNORMAL LOW (ref 150–400)
RBC: 2.45 MIL/uL — ABNORMAL LOW (ref 4.22–5.81)
RDW: 15.8 % — ABNORMAL HIGH (ref 11.5–15.5)
WBC: 11.4 10*3/uL — ABNORMAL HIGH (ref 4.0–10.5)
nRBC: 2.4 % — ABNORMAL HIGH (ref 0.0–0.2)

## 2020-09-18 LAB — MRSA PCR SCREENING: MRSA by PCR: NEGATIVE

## 2020-09-18 NOTE — Plan of Care (Signed)
  Problem: Education: Goal: Knowledge of General Education information will improve Description Including pain rating scale, medication(s)/side effects and non-pharmacologic comfort measures Outcome: Progressing   Problem: Health Behavior/Discharge Planning: Goal: Ability to manage health-related needs will improve Outcome: Progressing   

## 2020-09-18 NOTE — Procedures (Signed)
   HEMODIALYSIS TREATMENT NOTE:  4 hour low-heparin HD completed via LUE AVG (15g ante/retrograde). Goal met: 2 liters removed without interruption in UF. All blood was returned and hemostasis was achieved in 15 minutes.  No changes from pre-dialysis assessment.  Rockwell Alexandria, RN

## 2020-09-18 NOTE — Progress Notes (Signed)
Nephrology Follow-Up Consult note   Assessment/Recommendations: Jeremy Johnson is a/an 74 y.o. male with a past medical history significant for ESRD, CHF, small cell lung Ca, HTN, COPD, and CAD, admitted for SOB.     1.  CHF exacerbation: admitted for cardiology workup, EF severely decreased at 15-20%. Challenging EDW on HD. Wife says may transfer to [facility] on Monday for intervention 2.  ESRD on TTS: [place].  Will do HD today. 3.  HTN: on amlodipine, metop, and clonidine as OP- agree with holding BP meds for now 4.  Anemia: no ESA d/t malignancy 5.  BMD: will do calcitriol TIW 6.  Nutrition: renal vit with zinc, will see if biotin will help appetite 7.  Dispo: ctm  Dialysis orders:   TTS [facility] 4 hr via AVG EDW 66 kg, 3K 2.5 Ca bath BFR 400 DFR A1.5 Heparin 3000 u bolus Calcitriol 0.5 mcg TIW   Recommendations conveyed to primary service.    [facility] 09/18/2020 11:52 AM  ___________________________________________________________  CC: ESRD  Interval History/Subjective: No complaints today. Minimal SOB.   Medications:  Current Facility-Administered Medications  Medication Dose Route Frequency Provider Last Rate Last Admin  . 0.9 %  sodium chloride infusion  100 mL Intravenous PRN Madelon Lips, MD      . 0.9 %  sodium chloride infusion  100 mL Intravenous PRN Madelon Lips, MD      . albuterol (VENTOLIN HFA) 108 (90 Base) MCG/ACT inhaler 2 puff  2 puff Inhalation Q6H PRN Adefeso, Oladapo, DO      . allopurinol (ZYLOPRIM) tablet 100 mg  100 mg Oral Daily Hosie Poisson, MD   100 mg at 09/18/20 0923  . aspirin EC tablet 81 mg  81 mg Oral QHS Adefeso, Oladapo, DO   81 mg at 09/17/20 2254  . Chlorhexidine Gluconate Cloth 2 % PADS 6 each  6 each Topical Q0600 Madelon Lips, MD      . heparin injection 1,000 Units  1,000 Units Dialysis PRN Madelon Lips, MD      . heparin injection 3,000 Units  3,000 Units Dialysis  PRN Madelon Lips, MD      . heparin injection 5,000 Units  5,000 Units Subcutaneous Q8H Hosie Poisson, MD   5,000 Units at 09/18/20 0620  . levothyroxine (SYNTHROID) tablet 175 mcg  175 mcg Oral Q0600 Adefeso, Oladapo, DO   175 mcg at 09/18/20 0619  . lidocaine (PF) (XYLOCAINE) 1 % injection 5 mL  5 mL Intradermal PRN Madelon Lips, MD      . lidocaine-prilocaine (EMLA) cream 1 application  1 application Topical PRN Madelon Lips, MD      . metoprolol succinate (TOPROL-XL) 24 hr tablet 25 mg  25 mg Oral Daily Arnoldo Lenis, MD   25 mg at 09/18/20 0923  . pantoprazole (PROTONIX) EC tablet 40 mg  40 mg Oral Daily Adefeso, Oladapo, DO   40 mg at 09/18/20 0924  . pentafluoroprop-tetrafluoroeth (GEBAUERS) aerosol 1 application  1 application Topical PRN Madelon Lips, MD      . pramipexole (MIRAPEX) tablet 0.25 mg  0.25 mg Oral QHS Adefeso, Oladapo, DO          Review of Systems: 10 systems reviewed and negative except per interval history/subjective  Physical Exam: Vitals:   09/18/20 0800 09/18/20 1124  BP: 114/73   Pulse: (!) 102 (!) 101  Resp: (!) 26 (!) 31  Temp:  97.8 F (36.6 C)  SpO2: 98% 97%  No intake/output data recorded.  Intake/Output Summary (Last 24 hours) at 09/18/2020 1152 Last data filed at 09/18/2020 0800 Gross per 24 hour  Intake 0 ml  Output --  Net 0 ml   Constitutional: well-appearing, no acute distress ENMT: ears and nose without scars or lesions, MMM CV: normal rate, no edema Respiratory: bilateral chest rise, normal work of breathing Gastrointestinal: soft, non-tender, no palpable masses or hernias Skin: no visible lesions or rashes Psych: alert, judgement/insight appropriate, appropriate mood and affect   Test Results I personally reviewed new and old clinical labs and radiology tests Lab Results  Component Value Date   NA 134 (L) 09/17/2020   K 4.7 09/17/2020   CL 95 (L) 09/17/2020   CO2 26 09/17/2020   BUN 30 (H) 09/17/2020    CREATININE 4.96 (H) 09/17/2020   CALCIUM 8.6 (L) 09/17/2020   ALBUMIN 2.7 (L) 09/17/2020   PHOS 4.6 09/17/2020

## 2020-09-18 NOTE — Progress Notes (Addendum)
PROGRESS NOTE    Jeremy Johnson  QZE:[id number] DOB: May 30, 1946 DOA: 09/16/2020 PCP: [name], MD   Chief Complaint  Patient presents with  . Shortness of Breath    Brief Narrative:   74 year old gentleman with prior history of end-stage renal disease on dialysis, coronary artery disease, COPD, hypertension, hyperlipidemia, hypothyroidism, history of small cell lung cancer presents to ED for worsening shortness of breath associated with tachycardia and tachypnea patient reports that he had dialysis on 11/4 and was on his way to his home when he felt short of breath. On admission his labs were significant for elevated BNP, minimally elevated troponins. CT chest without contrast did not show any pulmonary edema or opacity showed multiple pulmonary nodules which is similar to the old imaging. Currently is requiring up to 3 L of nasal cannula oxygen, tachypneic, tachycardic and not at baseline. ECHO showed severe Left ventricular ejection fraction, by estimation, is 15-20%. The left  ventricle has severely decreased function, with global hypokinesis. Left ventricular diastolic parameters are consistent with Grade III diastolic dysfunction (restrictive).  Cardiology on board and recommends [facility] on Monday.  Nephrology on board for ESRD and plan for HD later today.   Assessment & Plan:   Active Problems:   Acquired hypothyroidism   HLD (hyperlipidemia)   Essential hypertension   GERD   Small cell lung cancer (HCC)   ESRD (end stage renal disease) on dialysis (HCC)   Shortness of breath   Chronic obstructive pulmonary disease/emphysema   Acute CHF (congestive heart failure) (HCC)   Restless leg syndrome   Elevated troponin I level    Acute systolic and diastolic heart failure   CT chest without contrast did not show any pulmonary edema or pneumonia. Echocardiogram done showed  Left ventricular ejection fraction, by estimation, is 15-20%. The left  ventricle has severely decreased  function, with global hypokinesis. Left ventricular diastolic parameters are consistent with Grade III diastolic dysfunction (restrictive).  Cardiology on board and recommended cardiac cath on Monday. Patient currently denies any chest pain. Sob has improved , and oxygen requirement has improved from 3 lit/min to 2 lit/min. But he remains very tachypneic on exertion.  Fluid management as per HD as pt does not make urine.    ESRD on HD Last HD on 11/4.  Nephrology consulted.  Plan for HD today.    CAD:  Pt currently denies chest pain.  Resume aspirin, crestor on hold due to elevated liver enzymes.    Essential hypertension:  bp parameters are borderline low.    Hypothyroidism:  Continue with Synthroid.   Hyperlipidemia Crestor on hold due to elevated liver enzymes.   Elevated liver enzymes Patient currently denies any nausea vomiting or abdominal pain. Statin is on hold. Ultrasound of the abdomen ordered for further evaluation, did not show any signs of acute cholecystitis.  Repeat liver enzymes tomorrow.    GERD Continue with Protonix.   Restless leg syndrome continue with home medication.   Pulmonary nodules CT chest showed multiple pulmonary nodules similar to old imaging.  Anemia of chronic disease:  Probably from ESRD, will check iron studies.  Baseline hemoglobin around 9, dropped to 7.6 this admission.  Rpt cbc in am.  Transfuse to keep hemoglobin greater than 7.  Pt denies any rectal bleeding at this time.     DVT prophylaxis: heparin  Code Status: (Full code.  Family Communication: wife at bedside.  Disposition:   Status is: Inpatient  Remains inpatient appropriate because:Ongoing diagnostic testing needed  not appropriate for outpatient work up, IV treatments appropriate due to intensity of illness or inability to take PO and Inpatient level of care appropriate due to severity of illness   Dispo: The patient is from: Home               Anticipated d/c is to: pending.               Anticipated d/c date is: 3 days              Patient currently is not medically stable to d/c.       Consultants:   Cardiology.   nephrology  Procedures:  Echocardiogram.    Antimicrobials: none.    Subjective: Much better than yesterday,  No chest pain, nausea, vomiting or abd pain.  Objective: Vitals:   09/18/20 0700 09/18/20 0737 09/18/20 0800 09/18/20 1124  BP: 106/73  114/73   Pulse: (!) 108 (!) 107 (!) 102 (!) 101  Resp: (!) 24 (!) 28 (!) 26 (!) 31  Temp:  97.7 F (36.5 C)  97.8 F (36.6 C)  TempSrc:  Oral  Oral  SpO2: 98% 97% 98% 97%    Intake/Output Summary (Last 24 hours) at 09/18/2020 1134 Last data filed at 09/18/2020 0800 Gross per 24 hour  Intake 0 ml  Output --  Net 0 ml   There were no vitals filed for this visit.  Examination:  General exam: alert and comfortable on 2 lit of [place] oxygen.  Respiratory system: diminished air entry at bases, no wheezing heard, tachypnea present  Cardiovascular system: S1 , S2 heard, no pedal edema, tachycardic.  Gastrointestinal system: Abdomen is soft, NT BS+ Central nervous system: Alert and oriented. Grossly non focal.  Extremities: NO PEDAL EDEMA.  Skin: No rashes seen.  Psychiatry: Mood is appropriate.     Data Reviewed: I have personally reviewed following labs and imaging studies  CBC: Recent Labs  Lab 09/16/20 1636 09/17/20 0524  WBC 8.5 7.3  HGB 8.4* 7.6*  HCT 26.4* 23.6*  MCV 99.2 98.7  PLT 221 025    Basic Metabolic Panel: Recent Labs  Lab 09/16/20 1636 09/17/20 0524  NA 134* 134*  K 4.1 4.7  CL 94* 95*  CO2 29 26  GLUCOSE 118* 88  BUN 20 30*  CREATININE 3.76* 4.96*  CALCIUM 8.7* 8.6*  MG  --  2.0  PHOS  --  4.6    GFR: CrCl cannot be calculated (Unknown ideal weight.).  Liver Function Tests: Recent Labs  Lab 09/17/20 0524  AST 346*  ALT 196*  ALKPHOS 107  BILITOT 0.7  PROT 7.2  ALBUMIN 2.7*    CBG: No results for  input(s): GLUCAP in the last 168 hours.   Recent Results (from the past 240 hour(s))  Respiratory Panel by RT PCR (Flu A&B, Covid) - Nasopharyngeal Swab     Status: None   Collection Time: 09/16/20  4:29 PM   Specimen: Nasopharyngeal Swab  Result Value Ref Range Status   SARS Coronavirus 2 by RT PCR NEGATIVE NEGATIVE Final    Comment: (NOTE) SARS-CoV-2 target nucleic acids are NOT DETECTED.  The SARS-CoV-2 RNA is generally detectable in upper respiratoy specimens during the acute phase of infection. The lowest concentration of SARS-CoV-2 viral copies this assay can detect is 131 copies/mL. A negative result does not preclude SARS-Cov-2 infection and should not be used as the sole basis for treatment or other patient management decisions. A negative result may occur with  improper specimen collection/handling, submission of specimen other than nasopharyngeal swab, presence of viral mutation(s) within the areas targeted by this assay, and inadequate number of viral copies (<131 copies/mL). A negative result must be combined with clinical observations, patient history, and epidemiological information. The expected result is Negative.  Fact Sheet for Patients:  PinkCheek.be  Fact Sheet for Healthcare Providers:  GravelBags.it  This test is no t yet approved or cleared by the [place] FDA and  has been authorized for detection and/or diagnosis of SARS-CoV-2 by FDA under an Emergency Use Authorization (EUA). This EUA will remain  in effect (meaning this test can be used) for the duration of the COVID-19 declaration under Section 564(b)(1) of the Act, 21 U.S.C. section 360bbb-3(b)(1), unless the authorization is terminated or revoked sooner.     Influenza A by PCR NEGATIVE NEGATIVE Final   Influenza B by PCR NEGATIVE NEGATIVE Final    Comment: (NOTE) The Xpert Xpress SARS-CoV-2/FLU/RSV assay is intended as an aid in    the diagnosis of influenza from Nasopharyngeal swab specimens and  should not be used as a sole basis for treatment. Nasal washings and  aspirates are unacceptable for Xpert Xpress SARS-CoV-2/FLU/RSV  testing.  Fact Sheet for Patients: PinkCheek.be  Fact Sheet for Healthcare Providers: GravelBags.it  This test is not yet approved or cleared by the [place] FDA and  has been authorized for detection and/or diagnosis of SARS-CoV-2 by  FDA under an Emergency Use Authorization (EUA). This EUA will remain  in effect (meaning this test can be used) for the duration of the  Covid-19 declaration under Section 564(b)(1) of the Act, 21  U.S.C. section 360bbb-3(b)(1), unless the authorization is  terminated or revoked. Performed at [facility], [address] [place], [place] [address]   MRSA PCR Screening     Status: None   Collection Time: 09/17/20  8:41 PM   Specimen: Nasal Mucosa; Nasopharyngeal  Result Value Ref Range Status   MRSA by PCR NEGATIVE NEGATIVE Final    Comment:        The GeneXpert MRSA Assay (FDA approved for NASAL specimens only), is one component of a comprehensive MRSA colonization surveillance program. It is not intended to diagnose MRSA infection nor to guide or monitor treatment for MRSA infections. Performed at [facility], [address], [place], [place] [address]          Radiology Studies: CT Chest Wo Contrast  Result Date: 09/16/2020 CLINICAL DATA:  COPD exacerbation. Intermittent shortness of breath for 2 weeks. Patient with history of small cell lung cancer. EXAM: CT CHEST WITHOUT CONTRAST TECHNIQUE: Multidetector CT imaging of the chest was performed following the standard protocol without IV contrast. COMPARISON:  Radiograph earlier today. Most recent chest CT 07/20/2020 FINDINGS: Cardiovascular: Chronic cardiomegaly. Coronary artery calcifications. No pericardial effusion. There is  diffuse aortic atherosclerosis and tortuosity. Focal dilatation of the proximal transverse aorta measuring 4.6 cm, unchanged. Focal dilatation at the diaphragmatic hiatus of 4.1 cm unchanged. There is no periaortic stranding. Mediastinum/Nodes: Paucity of mediastinal fat lack of IV contrast limits detailed assessment. There are multiple small mediastinal nodes are unchanged from prior exam and not enlarged by size criteria. Post treatment changes in the left hilar region, grossly stable. Hilar assessment limited in the absence of IV contrast. No esophageal wall thickening. No evidence of thyroid nodule. Lungs/Pleura: Advanced emphysema. Left paramediastinal post treatment related changes with volume loss and ill-defined opacity, stable. Unchanged partially loculated left pleural effusion. Multiple pulmonary nodules. Right  upper lobe nodule measures 2 mm, series 4, image 31, previously 4 mm. Subpleural right upper lobe pulmonary nodule measures 7 mm, series 4, image 37, unchanged or slightly decreased. Continued decreased size of a linear subpleural opacity in the left lower lobe, series 4, image 79, with no definite AP component to measure, 10 mm transverse, previously 12 mm. Chronic fissural thickening in the right minor fissure, unchanged. There is no new pulmonary nodule. No evidence of acute airspace disease. No findings of pulmonary edema. Small right pleural effusion on prior has diminished, minimal pleural thickening inferiorly. No endobronchial lesion. Upper Abdomen: No acute findings. Musculoskeletal: There are no acute or suspicious osseous abnormalities. No evidence of focal bone lesion. IMPRESSION: 1. No acute findings. 2. Emphysema with post treatment related changes in the left lung, not significantly changed from September 2021 chest CT. 3. Stable small left pleural effusion that is partially loculated. Previous right pleural effusion is diminished. 4. Multiple pulmonary nodules, stable or slightly  decreased in size from prior exam. No new pulmonary nodule or mass. 5. Stable cardiomegaly. Stable aortic atherosclerosis with proximal transverse aortic aneurysm of 4.6 cm. No evidence of acute aortic abnormality or inflammation. Recommend semiannual imaging with CTA or MRA. Aortic Atherosclerosis (ICD10-I70.0) and Emphysema (ICD10-J43.9). Electronically Signed   By: Keith Rake M.D.   On: 09/16/2020 18:54   US Abdomen Limited  Result Date: 09/17/2020 CLINICAL DATA:  Elevated LFTs. EXAM: ULTRASOUND ABDOMEN LIMITED RIGHT UPPER QUADRANT COMPARISON:  CT AP 04/17/2019 FINDINGS: Note: Exam was performed in the nonfasting state. Patient ate breakfast at 10 a.m. Gallbladder: Partially collapsed gallbladder is identified with mild wall thickening measuring 4 mm. No gallbladder sludge, stones. Negative sonographic Murphy's sign. No pericholecystic fluid. Common bile duct: Diameter: 4 mm Liver: No focal lesion identified. Within normal limits in parenchymal echogenicity. Portal vein is patent on color Doppler imaging with normal direction of blood flow towards the liver. Other: Right pleural effusion and multiple right renal cysts are again noted. IMPRESSION: 1. Mild gallbladder wall thickening measuring 4 mm in thickness. This is a nonspecific finding in the nonfasting state and may reflect incomplete distension. No gallstones, gallbladder sludge or pericholecystic fluid. If there is a clinical concern for acute cholecystitis consider further investigation with nuclear medicine hepatic biliary scan. 2. Incidental note of right pleural effusion and right kidney cysts. Electronically Signed   By: Kerby Moors M.D.   On: 09/17/2020 12:16   DG Chest Portable 1 View  Result Date: 09/16/2020 CLINICAL DATA:  74 year old male with shortness of breath for 2 weeks. Abnormal EKG. EXAM: PORTABLE CHEST 1 VIEW COMPARISON:  Chest radiographs 09/09/2020 and earlier - including chest CT 07/20/2020. FINDINGS: Portable AP  upright view at 1645 hours. Stable cardiomegaly and mediastinal contours. Continued dense retrocardiac opacification, left pleural effusion demonstrated in September. Superimposed chronic left perihilar radiation changes to the lung. Underlying bilateral chronic increased interstitial markings, emphysema demonstrated on prior CT. No overt edema. No pneumothorax. Stable visualized osseous structures. IMPRESSION: 1. Stable ventilation since 09/09/2020. 2. Continued dense left lung base opacity likely representing a combination of pleural effusion and atelectasis. 3. Chronic cardiomegaly, emphysema and radiation pulmonary changes. Electronically Signed   By: Genevie Ann M.D.   On: 09/16/2020 17:21   ECHOCARDIOGRAM COMPLETE  Result Date: 09/17/2020    ECHOCARDIOGRAM REPORT   Patient Name:   [name] Date of Exam: 09/17/2020 Medical Rec #:  [id number]       Height:       69.0  in Accession #:    [id number]      Weight:       151.9 lb Date of Birth:  January 17, 1946      BSA:          1.838 m Patient Age:    24 years        BP:           108/74 mmHg Patient Gender: M               HR:           108 bpm. Exam Location:  [facility] Procedure: 2D Echo Indications:    CHF-Acute Systolic 627.03 / J00.93  History:        Patient has no prior history of Echocardiogram examinations.                 Risk Factors:Dyslipidemia and Former Smoker. ESRD, Small Cell                 Lung Cancer, Elevated Troponin.  Sonographer:    Leavy Cella RDCS (AE) Referring Phys: [id number] OLADAPO ADEFESO IMPRESSIONS  1. Left ventricular ejection fraction, by estimation, is 15-20%. The left ventricle has severely decreased function. The left ventricle demonstrates global hypokinesis. The left ventricular internal cavity size was mildly dilated. Left ventricular diastolic parameters are consistent with Grade III diastolic dysfunction (restrictive). Elevated left atrial pressure.  2. Right ventricular systolic function is mildly reduced. The right  ventricular size is normal.  3. Left atrial size was severely dilated.  4. Right atrial size was severely dilated.  5. Large pleural effusion in the left lateral region.  6. The mitral valve is normal in structure. Mild mitral valve regurgitation. No evidence of mitral stenosis.  7. The aortic valve is tricuspid. Aortic valve regurgitation is not visualized. No aortic stenosis is present.  8. The inferior vena cava is dilated in size with >50% respiratory variability, suggesting right atrial pressure of 8 mmHg. FINDINGS  Left Ventricle: LV global hypokinesis, the anteroseptal wall is akinetic. Left ventricular ejection fraction, by estimation, is 15-20%. The left ventricle has severely decreased function. The left ventricle demonstrates global hypokinesis. The left ventricular internal cavity size was mildly dilated. There is no left ventricular hypertrophy. Left ventricular diastolic parameters are consistent with Grade III diastolic dysfunction (restrictive). Elevated left atrial pressure. Right Ventricle: The right ventricular size is normal. Right vetricular wall thickness was not well visualized. Right ventricular systolic function is mildly reduced. Left Atrium: Left atrial size was severely dilated. Right Atrium: Right atrial size was severely dilated. Pericardium: There is no evidence of pericardial effusion. Mitral Valve: The mitral valve is normal in structure. Mild mitral valve regurgitation. No evidence of mitral valve stenosis. Tricuspid Valve: The tricuspid valve is normal in structure. Tricuspid valve regurgitation is mild . No evidence of tricuspid stenosis. Aortic Valve: The aortic valve is tricuspid. Aortic valve regurgitation is not visualized. No aortic stenosis is present. Aortic valve mean gradient measures 5.7 mmHg. Aortic valve peak gradient measures 13.1 mmHg. Aortic valve area, by VTI measures 1.69  cm. Pulmonic Valve: The pulmonic valve was not well visualized. Pulmonic valve  regurgitation is not visualized. No evidence of pulmonic stenosis. Aorta: The aortic root is normal in size and structure. Pulmonary Artery: Mild pulmonary HTN, PASP is 27 mmHg. Venous: The inferior vena cava is dilated in size with greater than 50% respiratory variability, suggesting right atrial pressure of 8 mmHg. IAS/Shunts: The interatrial septum was not well  visualized. Additional Comments: There is a large pleural effusion in the left lateral region.  LEFT VENTRICLE PLAX 2D LVIDd:         6.37 cm  Diastology LVIDs:         5.91 cm  LV e' medial:    3.81 cm/s LV PW:         1.24 cm  LV E/e' medial:  27.6 LV IVS:        1.02 cm  LV e' lateral:   9.65 cm/s LVOT diam:     2.30 cm  LV E/e' lateral: 10.9 LV SV:         41 LV SV Index:   22 LVOT Area:     4.15 cm  RIGHT VENTRICLE RV S prime:     6.97 cm/s LEFT ATRIUM              Index       RIGHT ATRIUM           Index LA diam:        4.20 cm  2.29 cm/m  RA Area:     28.20 cm LA Vol (A2C):   101.0 ml 54.95 ml/m RA Volume:   113.00 ml 61.48 ml/m LA Vol (A4C):   78.4 ml  42.65 ml/m LA Biplane Vol: 89.9 ml  48.91 ml/m  AORTIC VALVE AV Area (Vmax):    1.66 cm AV Area (Vmean):   1.74 cm AV Area (VTI):     1.69 cm AV Vmax:           180.98 cm/s AV Vmean:          111.041 cm/s AV VTI:            0.242 m AV Peak Grad:      13.1 mmHg AV Mean Grad:      5.7 mmHg LVOT Vmax:         72.41 cm/s LVOT Vmean:        46.635 cm/s LVOT VTI:          0.099 m LVOT/AV VTI ratio: 0.41  AORTA Ao Root diam: 3.50 cm MITRAL VALVE                TRICUSPID VALVE MV Area (PHT): 4.80 cm     TR Peak grad:   28.9 mmHg MV Decel Time: 158 msec     TR Vmax:        269.00 cm/s MR Peak grad: 59.3 mmHg MR Mean grad: 42.0 mmHg     SHUNTS MR Vmax:      385.00 cm/s   Systemic VTI:  0.10 m MR Vmean:     317.0 cm/s    Systemic Diam: 2.30 cm MV E velocity: 105.00 cm/s Carlyle Dolly MD Electronically signed by Carlyle Dolly MD Signature Date/Time: 09/17/2020/1:35:20 PM    Final          Scheduled Meds: . allopurinol  100 mg Oral Daily  . aspirin EC  81 mg Oral QHS  . Chlorhexidine Gluconate Cloth  6 each Topical Q0600  . heparin injection (subcutaneous)  5,000 Units Subcutaneous Q8H  . levothyroxine  175 mcg Oral Q0600  . metoprolol succinate  25 mg Oral Daily  . pantoprazole  40 mg Oral Daily  . pramipexole  0.25 mg Oral QHS   Continuous Infusions: . sodium chloride    . sodium chloride       LOS: 2 days  Hosie Poisson, MD [facility]   To contact the attending provider between 7A-7P or the covering provider during after hours 7P-7A, please log into the web site [email or web address] and access using universal [place] password for that web site. If you do not have the password, please call the hospital operator.  09/18/2020, 11:34 AM

## 2020-09-19 LAB — IRON AND TIBC
Iron: 187 ug/dL — ABNORMAL HIGH (ref 45–182)
Saturation Ratios: 95 % — ABNORMAL HIGH (ref 17.9–39.5)
TIBC: 196 ug/dL — ABNORMAL LOW (ref 250–450)
UIBC: 9 ug/dL

## 2020-09-19 LAB — COMPREHENSIVE METABOLIC PANEL
ALT: 1006 U/L — ABNORMAL HIGH (ref 0–44)
AST: 1073 U/L — ABNORMAL HIGH (ref 15–41)
Albumin: 2.7 g/dL — ABNORMAL LOW (ref 3.5–5.0)
Alkaline Phosphatase: 132 U/L — ABNORMAL HIGH (ref 38–126)
Anion gap: 13 (ref 5–15)
BUN: 29 mg/dL — ABNORMAL HIGH (ref 8–23)
CO2: 27 mmol/L (ref 22–32)
Calcium: 8.8 mg/dL — ABNORMAL LOW (ref 8.9–10.3)
Chloride: 94 mmol/L — ABNORMAL LOW (ref 98–111)
Creatinine, Ser: 3.99 mg/dL — ABNORMAL HIGH (ref 0.61–1.24)
GFR, Estimated: 15 mL/min — ABNORMAL LOW (ref >60)
Glucose, Bld: 94 mg/dL (ref 70–99)
Potassium: 4.6 mmol/L (ref 3.5–5.1)
Sodium: 134 mmol/L — ABNORMAL LOW (ref 135–145)
Total Bilirubin: 0.7 mg/dL (ref 0.3–1.2)
Total Protein: 7.1 g/dL (ref 6.5–8.1)

## 2020-09-19 LAB — CBC
HCT: 24.9 % — ABNORMAL LOW (ref 39.0–52.0)
Hemoglobin: 8.1 g/dL — ABNORMAL LOW (ref 13.0–17.0)
MCH: 31.4 pg (ref 26.0–34.0)
MCHC: 32.5 g/dL (ref 30.0–36.0)
MCV: 96.5 fL (ref 80.0–100.0)
Platelets: 123 10*3/uL — ABNORMAL LOW (ref 150–400)
RBC: 2.58 MIL/uL — ABNORMAL LOW (ref 4.22–5.81)
RDW: 15.3 % (ref 11.5–15.5)
WBC: 8.9 10*3/uL (ref 4.0–10.5)
nRBC: 4.4 % — ABNORMAL HIGH (ref 0.0–0.2)

## 2020-09-19 NOTE — Consult Note (Signed)
Referring Provider: No ref. provider found Primary Care Physician:  [name], MD Primary Gastroenterologist:  Dr.Carver  Reason for Consultation: Elevated aminotransferases   HPI: Pleasant 74 year old gentleman with a history of CAD/heart failure, chronic kidney disease on hemodialysis, COPD and small cell carcinoma of the lung admitted to the hospital 09/16/2020 with congestive heart failure after presenting with shortness of breath.  BNP greater than 4500.  History of LVEF of 15 to 20% on echo.  Cardiology consult obtained.  Plans for [facility] tomorrow. Blood work this morning revealed an AST of 1,073 and ALT of 1,006; 346 and 196 on November 5, respectively.  39 and 35 respectively on July 20, 2020. Alkaline phosphatase mildly elevated at 132 this morning;  total bilirubin 0.7.  INR 1.3. Ultrasound demonstrated mildly thickened gallbladder wall no evidence of biliary obstruction or focal hepatic lesion.  Portal vein patent. No prior history of LFT abnormalities.  No new recent medication/chemotherapy. Patient has recently been taking acetaminophen 1300 mg at bedtime regularly (denies taking any more than that although "as needed dosing" of 1300 mg 4 times daily listed in the outpatient medical record) Has not taken any Tylenol for 5 days now.  Other medications include allopurinol 100 mg daily.  Blood pressures have been soft. Recent GI evaluation for early satiety, weight loss-9/14.  EGD - esophageal stasis.  Gastric biopsies negative for H. pylori.  Multiple colonic adenomas removed at colonoscopy. Omeprazole increased to 40 mg twice daily;  GI follow-up with Dr. Abbey Chatters slated for November 17.   Past Medical History:  Diagnosis Date  . Anemia   . Blood transfusion without reported diagnosis   . CAD (coronary artery disease)    STENT... MID CIRCUMFLEX...1997  . Chronic kidney disease    STAGE 3  . COPD (chronic obstructive pulmonary disease) ([place])   . Degenerative joint  disease (DJD) of lumbar spine   . GERD (gastroesophageal reflux disease)   . Gout   . Hyperlipidemia   . Hypertension   . Hypothyroidism   . Incisional hernia    abdomen  . Leukocytosis    CHRONIC MILD  . Myocardial infarction ([place])    1997  . SCL CA dx'd 01/2019   Lung cancer    Past Surgical History:  Procedure Laterality Date  . ABDOMINAL AORTIC ANEURYSM REPAIR  2006  . AV FISTULA PLACEMENT Left 04/25/2019   Procedure: ARTERIOVENOUS (AV) FISTULA CREATION LEFT ARM;  Surgeon: Angelia Mould, MD;  Location: [place];  Service: Vascular;  Laterality: Left;  . AV FISTULA PLACEMENT Left 05/19/2019   Procedure: CONVERSION OF LEFT ARM ARTERIOVENOUS FISTULA TO GRAFT;  Surgeon: Angelia Mould, MD;  Location: [place];  Service: Vascular;  Laterality: Left;  . BIOPSY  09/30/2018   Procedure: BIOPSY;  Surgeon: Danie Binder, MD;  [facility];  Service: Endoscopy;;  ascending colon  . BIOPSY  07/27/2020   Procedure: BIOPSY;  Surgeon: Eloise Harman, DO;  [facility];  Service: Endoscopy;;  gastric  . COLONOSCOPY  2008  . COLONOSCOPY N/A 09/30/2018   External and internal hemorrhoids, six polyps removed, one ascending colon polypoid lesion biopsied. Six simple adenomas and one benign polypoid lesion. Colonoscopy Nov 2022.   [name] COLONOSCOPY WITH PROPOFOL N/A 07/27/2020   Procedure: COLONOSCOPY WITH PROPOFOL;  Surgeon: Eloise Harman, DO;  [facility];  Service: Endoscopy;  Laterality: N/A;  11:15am  . CORONARY ANGIOPLASTY WITH STENT [place]  .  ESOPHAGOGASTRODUODENOSCOPY (EGD) WITH PROPOFOL N/A 07/27/2020   Procedure: ESOPHAGOGASTRODUODENOSCOPY (EGD) WITH PROPOFOL;  Surgeon: Eloise Harman, DO;  [facility];  Service: Endoscopy;  Laterality: N/A;  . HIP ARTHROPLASTY Right 04/30/2020   Procedure: ARTHROPLASTY  HIP (HEMIARTHROPLASTY);  Surgeon: Altamese Algonac, MD;  Location: [place];  Service: Orthopedics;   Laterality: Right;  . IR FLUORO GUIDE CV LINE RIGHT  04/22/2019  . IR FLUORO GUIDE CV LINE RIGHT  05/01/2020  . IR THROMBECTOMY AV FISTULA W/THROMBOLYSIS/PTA INC/SHUNT/IMG LEFT Left 05/03/2020  . IR US GUIDE VASC ACCESS LEFT  05/03/2020  . IR US GUIDE VASC ACCESS RIGHT  04/22/2019  . IR US GUIDE VASC ACCESS RIGHT  05/01/2020  . POLYPECTOMY  09/30/2018   Procedure: POLYPECTOMY;  Surgeon: Danie Binder, MD;  [facility];  Service: Endoscopy;;  colon  . POLYPECTOMY  07/27/2020   Procedure: POLYPECTOMY;  Surgeon: Eloise Harman, DO;  [facility];  Service: Endoscopy;;  . VIDEO BRONCHOSCOPY WITH ENDOBRONCHIAL NAVIGATION N/A 01/27/2019   Procedure: VIDEO BRONCHOSCOPY WITH ENDOBRONCHIAL NAVIGATION;  Surgeon: Grace Isaac, MD;  Location: [place];  Service: Thoracic;  Laterality: N/A;  . VIDEO BRONCHOSCOPY WITH ENDOBRONCHIAL ULTRASOUND N/A 01/27/2019   Procedure: VIDEO BRONCHOSCOPY WITH ENDOBRONCHIAL ULTRASOUND;  Surgeon: Grace Isaac, MD;  Location: [place];  Service: Thoracic;  Laterality: N/A;    Prior to Admission medications   Medication Sig Start Date End Date Taking? Authorizing Provider  acetaminophen (TYLENOL) 325 MG tablet Take 2 tablets (650 mg total) by mouth every 6 (six) hours as needed for mild pain (or Fever >/= 101). Patient taking differently: Take 1,300 mg by mouth every 6 (six) hours as needed for mild pain or headache (or Fever >/= 101). Arthritis strength 05/25/19  Yes Swayze, Ava, DO  albuterol (VENTOLIN HFA) 108 (90 Base) MCG/ACT inhaler Inhale 2 puffs into the lungs every 6 (six) hours as needed for wheezing or shortness of breath.  02/02/20  Yes [provider]  allopurinol (ZYLOPRIM) 100 MG tablet Take 1 tablet (100 mg total) by mouth daily. Patient taking differently: Take 100 mg by mouth in the morning.  04/26/19  Yes Mercy Riding, MD  amLODipine (NORVASC) 10 MG tablet Take 10 mg by mouth at bedtime.  08/16/19  Yes [provider]    aspirin EC 81 MG tablet Take 81 mg by mouth at bedtime.   Yes [provider]  B Complex-C-Zn-Folic Acid (DIALYVITE 481-EHUD 15) 0.8 MG TABS Take 1 tablet by mouth daily. 04/22/20  Yes [provider]  Cyanocobalamin (VITAMIN B 12) 500 MCG TABS Take 1,000 mg by mouth daily.   Yes [provider]  Darbepoetin Alfa (ARANESP) 100 MCG/0.5ML SOSY injection Inject 0.5 mLs (100 mcg total) into the vein every Thursday with hemodialysis. 05/01/19  Yes Wendee Beavers T, MD  iron sucrose in sodium chloride 0.9 % 100 mL 50 mg. 04/06/20 04/05/21 Yes [provider]  levothyroxine (SYNTHROID, LEVOTHROID) 175 MCG tablet Take 175 mcg by mouth daily before breakfast.    Yes [provider]  lidocaine-prilocaine (EMLA) cream SMARTSIG:1 Topical Every Night 08/28/20  Yes [provider]  Methoxy PEG-Epoetin Beta (MIRCERA IJ) Inject 30 mg into the vein every 28 (twenty-eight) days.  03/23/20 03/22/21 Yes [provider]  metoprolol tartrate (LOPRESSOR) 50 MG tablet Take 25 mg by mouth 2 (two) times daily.    Yes [provider]  omeprazole (PRILOSEC) 40 MG capsule Take 1 capsule (40 mg  total) by mouth in the morning and at bedtime. 07/27/20 09/16/20 Yes Carver, Charles K, DO  rosuvastatin (CRESTOR) 10 MG tablet Take 10 mg by mouth at bedtime. 01/30/20  Yes [provider]  Calcipotriene 0.005 % FOAM Apply to to body bid Patient not taking: Reported on 09/16/2020 08/05/20   Ralene Bathe, MD  pramipexole (MIRAPEX) 0.25 MG tablet Take 0.25 mg by mouth at bedtime. Patient not taking: Reported on 09/16/2020 03/31/20   [provider]  triamcinolone cream (KENALOG) 0.1 % Apply to aa's rash BID until clear then PRN thereafter. Avoid face, groin, and axilla. Patient not taking: Reported on 09/16/2020 06/09/20   Ralene Bathe, MD    Current Facility-Administered Medications  Medication Dose Route Frequency Provider Last Rate Last Admin  . 0.9  %  sodium chloride infusion  100 mL Intravenous PRN Madelon Lips, MD      . 0.9 %  sodium chloride infusion  100 mL Intravenous PRN Madelon Lips, MD      . albuterol (VENTOLIN HFA) 108 (90 Base) MCG/ACT inhaler 2 puff  2 puff Inhalation Q6H PRN Adefeso, Oladapo, DO      . allopurinol (ZYLOPRIM) tablet 100 mg  100 mg Oral Daily Hosie Poisson, MD   100 mg at 09/19/20 0916  . aspirin EC tablet 81 mg  81 mg Oral QHS Adefeso, Oladapo, DO   81 mg at 09/19/20 0031  . Chlorhexidine Gluconate Cloth 2 % PADS 6 each  6 each Topical Q0600 Madelon Lips, MD   6 each at 09/19/20 0656  . heparin injection 1,000 Units  1,000 Units Dialysis PRN Madelon Lips, MD      . heparin injection 3,000 Units  3,000 Units Dialysis PRN Madelon Lips, MD      . heparin injection 5,000 Units  5,000 Units Subcutaneous Q8H Hosie Poisson, MD   5,000 Units at 09/19/20 1425  . levothyroxine (SYNTHROID) tablet 175 mcg  175 mcg Oral Q0600 Adefeso, Oladapo, DO   175 mcg at 09/19/20 0656  . lidocaine (PF) (XYLOCAINE) 1 % injection 5 mL  5 mL Intradermal PRN Madelon Lips, MD      . lidocaine-prilocaine (EMLA) cream 1 application  1 application Topical PRN Madelon Lips, MD      . metoprolol succinate (TOPROL-XL) 24 hr tablet 25 mg  25 mg Oral Daily Arnoldo Lenis, MD   25 mg at 09/18/20 0923  . pantoprazole (PROTONIX) EC tablet 40 mg  40 mg Oral Daily Adefeso, Oladapo, DO   40 mg at 09/19/20 0916  . pentafluoroprop-tetrafluoroeth (GEBAUERS) aerosol 1 application  1 application Topical PRN Madelon Lips, MD      . pramipexole (MIRAPEX) tablet 0.25 mg  0.25 mg Oral QHS Adefeso, Oladapo, DO   0.25 mg at 09/19/20 0031    Allergies as of 09/16/2020 - Review Complete 09/16/2020  Allergen Reaction Noted  . Advair hfa [fluticasone-salmeterol] Other (See Comments) 04/29/2020  . Penicillins Rash 02/09/2010    Family History  Problem Relation Age of Onset  . Stroke Brother   . Lung cancer Sister 49       lung  cancer/former  . Colon cancer Neg Hx   . Colon polyps Neg Hx     Social History   Socioeconomic History  . Marital status: Married    Spouse name: Not on file  . Number of children: 2  . Years of education: Not on file  . Highest education level: Not on file  Occupational History  Comment: retired  Tobacco Use  . Smoking status: Former Smoker    Packs/day: 0.50    Years: 54.00    Pack years: 27.00    Quit date: 09/17/2017    Years since quitting: 3.0  . Smokeless tobacco: Never Used  Vaping Use  . Vaping Use: Never used  Substance and Sexual Activity  . Alcohol use: Not Currently    Comment: occasional  . Drug use: No  . Sexual activity: Not Currently  Other Topics Concern  . Not on file  Social History Narrative  . Not on file   Social Determinants of Health   Financial Resource Strain:   . Difficulty of Paying Living Expenses: Not on file  Food Insecurity:   . Worried About Charity fundraiser in the Last Year: Not on file  . Ran Out of Food in the Last Year: Not on file  Transportation Needs:   . Lack of Transportation (Medical): Not on file  . Lack of Transportation (Non-Medical): Not on file  Physical Activity:   . Days of Exercise per Week: Not on file  . Minutes of Exercise per Session: Not on file  Stress:   . Feeling of Stress : Not on file  Social Connections:   . Frequency of Communication with Friends and Family: Not on file  . Frequency of Social Gatherings with Friends and Family: Not on file  . Attends Religious Services: Not on file  . Active Member of Clubs or Organizations: Not on file  . Attends Archivist Meetings: Not on file  . Marital Status: Not on file  Intimate Partner Violence:   . Fear of Current or Ex-Partner: Not on file  . Emotionally Abused: Not on file  . Physically Abused: Not on file  . Sexually Abused: Not on file    Review of Systems:  As in history of present illness  Physical Exam: Vital signs in  last 24 hours: Temp:  [97.3 F (36.3 C)-98 F (36.7 C)] 97.7 F (36.5 C) (11/07 1313) Pulse Rate:  [100-123] 118 (11/07 1313) Resp:  [16-20] 19 (11/07 1313) BP: (95-111)/(67-77) 108/77 (11/07 1313) SpO2:  [97 %-100 %] 100 % (11/07 1313) Weight:  [64.4 kg-64.8 kg] 64.4 kg (11/07 0500) Last BM Date: 09/19/20 General:   Alert,   pleasant and cooperative in NAD; accompanied by his wife. Neck: HJR to the angle of the jaw Abdomen: Nondistended.  Positive bowel sounds soft nontender.  Liver edge palpable right costal margin. Intake/Output from previous day: 11/06 0701 - 11/07 0700 In: 0  Out: 2004  Intake/Output this shift: Total I/O In: 480 [P.O.:480] Out: -   Lab Results: Recent Labs    09/17/20 0524 09/18/20 2207 09/19/20 0559  WBC 7.3 11.4* 8.9  HGB 7.6* 7.8* 8.1*  HCT 23.6* 23.9* 24.9*  PLT 183 136* 123*   BMET Recent Labs    09/17/20 0524 09/18/20 2207 09/19/20 0559  NA 134* 131* 134*  K 4.7 5.4* 4.6  CL 95* 93* 94*  CO2 26 21* 27  GLUCOSE 88 127* 94  BUN 30* 69* 29*  CREATININE 4.96* 7.93* 3.99*  CALCIUM 8.6* 8.9 8.8*   LFT Recent Labs    09/19/20 0559  PROT 7.1  ALBUMIN 2.7*  AST 1,073*  ALT 1,006*  ALKPHOS 132*  BILITOT 0.7   PT/INR Recent Labs    09/17/20 0524  LABPROT 15.4*  INR 1.3*    Impression: 74 year old gentleman with CAD/markedly diminished LVEF, end-stage renal disease  on hemodialysis admitted to the hospital 11/4 with congestive heart failure.  Aminotransferases have risen significantly since admission. Etiology of elevation likely hepatic congestion/ischemia from right and left sided heart failure.  Other entities including acute viral hepatitis, biliary obstruction, autoimmune  as well as drug/toxin mediated hepatocellular injury felt to be much less likely (allopurinol rarely associated with acute hepatocellular injury -  patient on a very low dose chronically.  Recent acetaminophen ingestion of no more than 1300 mg daily likely  not a contributing factor here-discussed with Donna Christen in pharmacy).  History of recent EGD revealing esophageal stasis/ no H. pylori..  Multiple colonic adenomas removed.  Recommendations: Would hold up on an extensive serological work-up for the time being.    Optimize hemodynamics.  Patient is undergoing cardiac catheterization tomorrow  Repeat hepatic function profile and INR tomorrow  Continue omeprazole 40 mg twice daily till follow-up with Dr. Abbey Chatters on November 17        Notice:  This dictation was prepared with Dragon dictation along with smaller phrase technology. Any transcriptional errors that result from this process are unintentional and may not be corrected upon review.

## 2020-09-19 NOTE — Progress Notes (Signed)
   09/19/20 0852  Assess: MEWS Score  BP 95/69  Pulse Rate (!) 114  Resp 18  Level of Consciousness Alert  SpO2 100 %  O2 Device Nasal Cannula  O2 Flow Rate (L/min) 2 L/min  Assess: MEWS Score  MEWS Temp 0  MEWS Systolic 1  MEWS Pulse 2  MEWS RR 0  MEWS LOC 0  MEWS Score 3  MEWS Score Color Yellow  Assess: if the MEWS score is Yellow or Red  Were vital signs taken at a resting state? Yes  Focused Assessment No change from prior assessment  Early Detection of Sepsis Score *See Row Information* Low  MEWS guidelines implemented *See Row Information* No, previously yellow, continue vital signs every 4 hours  Treat  MEWS Interventions Other (Comment) (MD made aware)  Pain Scale 0-10  Pain Score 0  Take Vital Signs  Increase Vital Sign Frequency  Yellow: Q 2hr X 2 then Q 4hr X 2, if remains yellow, continue Q 4hrs  Escalate  MEWS: Escalate Yellow: discuss with charge nurse/RN and consider discussing with provider and RRT  Notify: Charge Nurse/RN  Name of Charge Nurse/RN Notified [place]   Date Charge Nurse/RN Notified 09/19/20  Time Charge Nurse/RN Notified 8110  Notify: Provider  Provider Name/Title A.Jeoffrey Massed   Date Provider Notified 09/19/20  Time Provider Notified [phone]  Notification Type Page  Notification Reason Change in status  Response No new orders  Date of Provider Response 09/19/20  Time of Provider Response [phone]

## 2020-09-19 NOTE — Progress Notes (Signed)
   09/19/20 0653  Assess: MEWS Score  Temp (!) 97.3 F (36.3 C)  BP 103/77  Pulse Rate (!) 116  Resp 18  Level of Consciousness Alert  SpO2 100 %  O2 Device Nasal Cannula  O2 Flow Rate (L/min) 2 L/min  Assess: MEWS Score  MEWS Temp 0  MEWS Systolic 0  MEWS Pulse 2  MEWS RR 0  MEWS LOC 0  MEWS Score 2  MEWS Score Color Yellow  Assess: if the MEWS score is Yellow or Red  Were vital signs taken at a resting state? Yes  Focused Assessment No change from prior assessment  Early Detection of Sepsis Score *See Row Information* Low  MEWS guidelines implemented *See Row Information* Yes  Take Vital Signs  Increase Vital Sign Frequency  Yellow: Q 2hr X 2 then Q 4hr X 2, if remains yellow, continue Q 4hrs  Escalate  MEWS: Escalate Yellow: discuss with charge nurse/RN and consider discussing with provider and RRT  Notify: Charge Nurse/RN  Name of Charge Nurse/RN Notified Lawernce Keas RN  Date Charge Nurse/RN Notified 09/19/20  Time Charge Nurse/RN Notified 0710  Notify: Provider  Provider Name/Title A. Jeoffrey Massed   Date Provider Notified 09/19/20  Time Provider Notified [phone]  Notification Type Page  Notification Reason Change in status  Response No new orders

## 2020-09-19 NOTE — Progress Notes (Signed)
PROGRESS NOTE    Jeremy Johnson  IWP:[id number] DOB: 1946-05-01 DOA: 09/16/2020 PCP: [name], MD   Chief Complaint  Patient presents with  . Shortness of Breath    Brief Narrative:   74 year old gentleman with prior history of end-stage renal disease on dialysis, coronary artery disease, COPD, hypertension, hyperlipidemia, hypothyroidism, history of small cell lung cancer presents to ED for worsening shortness of breath associated with tachycardia and tachypnea patient reports that he had dialysis on 11/4 and was on his way to his home when he felt short of breath. On admission his labs were significant for elevated BNP, minimally elevated troponins. CT chest without contrast did not show any pulmonary edema or opacity showed multiple pulmonary nodules which is similar to the old imaging. Currently is requiring up to 3 L of nasal cannula oxygen, tachypneic, tachycardic and not at baseline. ECHO showed severe Left ventricular ejection fraction, by estimation, is 15-20%. The left  ventricle has severely decreased function, with global hypokinesis. Left ventricular diastolic parameters are consistent with Grade III diastolic dysfunction (restrictive).  Cardiology on board and recommends [facility] on Monday.  Nephrology on board for ESRD , underwent HD last night.  Pt seen and examined at bedside, pt remains on 2lit of [place] OXYGEN, denies any sob or chest pain. But as per RN his bp parameters are borderline and he is tachycardic in 120/min.   Assessment & Plan:   Active Problems:   Acquired hypothyroidism   HLD (hyperlipidemia)   Essential hypertension   GERD   Small cell lung cancer (HCC)   ESRD (end stage renal disease) on dialysis (HCC)   Shortness of breath   Chronic obstructive pulmonary disease/emphysema   Acute CHF (congestive heart failure) (HCC)   Restless leg syndrome   Elevated troponin I level    Acute systolic and diastolic heart failure   CT chest without contrast did  not show any pulmonary edema or pneumonia. Echocardiogram done showed  Left ventricular ejection fraction, by estimation, is 15-20%. The left  ventricle has severely decreased function, with global hypokinesis. Left ventricular diastolic parameters are consistent with Grade III diastolic dysfunction (restrictive).  Cardiology on board and recommended cardiac cath on Monday. Patient currently denies any chest pain. Sob has improved , and oxygen requirement has improved from 3 lit/min to 2 lit/min. But he remains very tachypneic on exertion and tachycardic at 120/min.  Fluid management as per HD as pt does not make urine.  No new changes in meds.    ESRD on HD Last HD on 11/4.  Nephrology consulted. Underwent HD last night.    CAD:  Pt currently denies chest pain.  Resume aspirin, crestor on hold due to elevated liver enzymes.    Essential hypertension:  bp parameters are borderline low.    Hypothyroidism:  Continue with Synthroid.   Hyperlipidemia Crestor on hold due to elevated liver enzymes.   Elevated liver enzymes Patient currently denies any nausea vomiting or abdominal pain. Statin is on hold. Ultrasound of the abdomen ordered for further evaluation, did not show any signs of acute cholecystitis.  Repeat AST is 1073 and ALT IS 1006. Alk phos is 132. Normal bili 0.7.     GERD Continue with Protonix.   Restless leg syndrome continue with home medication.   Pulmonary nodules CT chest showed multiple pulmonary nodules similar to old imaging.  Anemia of chronic disease:  Probably from ESRD, will check iron studies. Iron is 187.  Baseline hemoglobin around 9, dropped to  7.6 this admission.  Rpt cbc in am shows hemoglobin of 8.1 Transfuse to keep hemoglobin greater than 7.  Pt denies any rectal bleeding at this time.     DVT prophylaxis: heparin  Code Status: (Full code.  Family Communication: wife at bedside.  Disposition:   Status is: Inpatient  Remains  inpatient appropriate because:Ongoing diagnostic testing needed not appropriate for outpatient work up, IV treatments appropriate due to intensity of illness or inability to take PO and Inpatient level of care appropriate due to severity of illness   Dispo: The patient is from: Home              Anticipated d/c is to: pending.               Anticipated d/c date is: 3 days              Patient currently is not medically stable to d/c.       Consultants:   Cardiology.   Nephrology  Gastroenterology for elevated transaminases.   Procedures:  Echocardiogram.    Antimicrobials: none.    Subjective: Alert and comfortable, no distress noted.   Objective: Vitals:   09/19/20 0653 09/19/20 0852 09/19/20 0913 09/19/20 1006  BP: 1'03/77 95/69 97/68 ' 100/77  Pulse: (!) 116 (!) 114  (!) 123  Resp: 18 18    Temp: (!) 97.3 F (36.3 C)     TempSrc: Oral     SpO2: 100% 100%    Weight:        Intake/Output Summary (Last 24 hours) at 09/19/2020 1251 Last data filed at 09/18/2020 2350 Gross per 24 hour  Intake --  Output 2004 ml  Net -2004 ml   Filed Weights   09/18/20 1940 09/19/20 0500  Weight: 64.8 kg 64.4 kg    Examination:  General exam:alert and comfortable not in distress on 2 lit of [place] Oxygen.  Respiratory system: diminished air entry at bases, no tachypnea, no wheezing heard.  Cardiovascular system: S1S2 heard, Tachycardic, no pedal edema.  Gastrointestinal system: Abdomen is soft, non tender non distended bowel sounds wnl.  Central nervous system: alert and oriented, grossly non focal.  Extremities: no pedal edema.  Skin: no rashes seen.  Psychiatry: Mood is appropriate.     Data Reviewed: I have personally reviewed following labs and imaging studies  CBC: Recent Labs  Lab 09/16/20 1636 09/17/20 0524 09/18/20 2207 09/19/20 0559  WBC 8.5 7.3 11.4* 8.9  HGB 8.4* 7.6* 7.8* 8.1*  HCT 26.4* 23.6* 23.9* 24.9*  MCV 99.2 98.7 97.6 96.5  PLT 221 183 136* 123*     Basic Metabolic Panel: Recent Labs  Lab 09/16/20 1636 09/17/20 0524 09/18/20 2207 09/19/20 0559  NA 134* 134* 131* 134*  K 4.1 4.7 5.4* 4.6  CL 94* 95* 93* 94*  CO2 29 26 21* 27  GLUCOSE 118* 88 127* 94  BUN 20 30* 69* 29*  CREATININE 3.76* 4.96* 7.93* 3.99*  CALCIUM 8.7* 8.6* 8.9 8.8*  MG  --  2.0  --   --   PHOS  --  4.6 7.7*  --     GFR: Estimated Creatinine Clearance: 15 mL/min (A) (by C-G formula based on SCr of 3.99 mg/dL (H)).  Liver Function Tests: Recent Labs  Lab 09/17/20 0524 09/18/20 2207 09/19/20 0559  AST 346*  --  1,073*  ALT 196*  --  1,006*  ALKPHOS 107  --  132*  BILITOT 0.7  --  0.7  PROT 7.2  --  7.1  ALBUMIN 2.7* 2.7* 2.7*    CBG: No results for input(s): GLUCAP in the last 168 hours.   Recent Results (from the past 240 hour(s))  Respiratory Panel by RT PCR (Flu A&B, Covid) - Nasopharyngeal Swab     Status: None   Collection Time: 09/16/20  4:29 PM   Specimen: Nasopharyngeal Swab  Result Value Ref Range Status   SARS Coronavirus 2 by RT PCR NEGATIVE NEGATIVE Final    Comment: (NOTE) SARS-CoV-2 target nucleic acids are NOT DETECTED.  The SARS-CoV-2 RNA is generally detectable in upper respiratoy specimens during the acute phase of infection. The lowest concentration of SARS-CoV-2 viral copies this assay can detect is 131 copies/mL. A negative result does not preclude SARS-Cov-2 infection and should not be used as the sole basis for treatment or other patient management decisions. A negative result may occur with  improper specimen collection/handling, submission of specimen other than nasopharyngeal swab, presence of viral mutation(s) within the areas targeted by this assay, and inadequate number of viral copies (<131 copies/mL). A negative result must be combined with clinical observations, patient history, and epidemiological information. The expected result is Negative.  Fact Sheet for Patients:   PinkCheek.be  Fact Sheet for Healthcare Providers:  GravelBags.it  This test is no t yet approved or cleared by the [place] FDA and  has been authorized for detection and/or diagnosis of SARS-CoV-2 by FDA under an Emergency Use Authorization (EUA). This EUA will remain  in effect (meaning this test can be used) for the duration of the COVID-19 declaration under Section 564(b)(1) of the Act, 21 U.S.C. section 360bbb-3(b)(1), unless the authorization is terminated or revoked sooner.     Influenza A by PCR NEGATIVE NEGATIVE Final   Influenza B by PCR NEGATIVE NEGATIVE Final    Comment: (NOTE) The Xpert Xpress SARS-CoV-2/FLU/RSV assay is intended as an aid in  the diagnosis of influenza from Nasopharyngeal swab specimens and  should not be used as a sole basis for treatment. Nasal washings and  aspirates are unacceptable for Xpert Xpress SARS-CoV-2/FLU/RSV  testing.  Fact Sheet for Patients: PinkCheek.be  Fact Sheet for Healthcare Providers: GravelBags.it  This test is not yet approved or cleared by the [place] FDA and  has been authorized for detection and/or diagnosis of SARS-CoV-2 by  FDA under an Emergency Use Authorization (EUA). This EUA will remain  in effect (meaning this test can be used) for the duration of the  Covid-19 declaration under Section 564(b)(1) of the Act, 21  U.S.C. section 360bbb-3(b)(1), unless the authorization is  terminated or revoked. Performed at [facility], [address], [place], [place] [address]   MRSA PCR Screening     Status: None   Collection Time: 09/17/20  8:41 PM   Specimen: Nasal Mucosa; Nasopharyngeal  Result Value Ref Range Status   MRSA by PCR NEGATIVE NEGATIVE Final    Comment:        The GeneXpert MRSA Assay (FDA approved for NASAL specimens only), is one component of a comprehensive MRSA  colonization surveillance program. It is not intended to diagnose MRSA infection nor to guide or monitor treatment for MRSA infections. Performed at [facility], [address] [place], [place] [address]          Radiology Studies: ECHOCARDIOGRAM COMPLETE  Result Date: 09/17/2020    ECHOCARDIOGRAM REPORT   Patient Name:   [name] Date of Exam: 09/17/2020 Medical Rec #:  [id number]  Height:       69.0 in Accession #:    [id number]      Weight:       151.9 lb Date of Birth:  01/22/1946      BSA:          1.838 m Patient Age:    41 years        BP:           108/74 mmHg Patient Gender: M               HR:           108 bpm. Exam Location:  [facility] Procedure: 2D Echo Indications:    CHF-Acute Systolic 546.27 / O35.00  History:        Patient has no prior history of Echocardiogram examinations.                 Risk Factors:Dyslipidemia and Former Smoker. ESRD, Small Cell                 Lung Cancer, Elevated Troponin.  Sonographer:    Leavy Cella RDCS (AE) Referring Phys: [id number] OLADAPO ADEFESO IMPRESSIONS  1. Left ventricular ejection fraction, by estimation, is 15-20%. The left ventricle has severely decreased function. The left ventricle demonstrates global hypokinesis. The left ventricular internal cavity size was mildly dilated. Left ventricular diastolic parameters are consistent with Grade III diastolic dysfunction (restrictive). Elevated left atrial pressure.  2. Right ventricular systolic function is mildly reduced. The right ventricular size is normal.  3. Left atrial size was severely dilated.  4. Right atrial size was severely dilated.  5. Large pleural effusion in the left lateral region.  6. The mitral valve is normal in structure. Mild mitral valve regurgitation. No evidence of mitral stenosis.  7. The aortic valve is tricuspid. Aortic valve regurgitation is not visualized. No aortic stenosis is present.  8. The inferior vena cava is dilated in size with >50%  respiratory variability, suggesting right atrial pressure of 8 mmHg. FINDINGS  Left Ventricle: LV global hypokinesis, the anteroseptal wall is akinetic. Left ventricular ejection fraction, by estimation, is 15-20%. The left ventricle has severely decreased function. The left ventricle demonstrates global hypokinesis. The left ventricular internal cavity size was mildly dilated. There is no left ventricular hypertrophy. Left ventricular diastolic parameters are consistent with Grade III diastolic dysfunction (restrictive). Elevated left atrial pressure. Right Ventricle: The right ventricular size is normal. Right vetricular wall thickness was not well visualized. Right ventricular systolic function is mildly reduced. Left Atrium: Left atrial size was severely dilated. Right Atrium: Right atrial size was severely dilated. Pericardium: There is no evidence of pericardial effusion. Mitral Valve: The mitral valve is normal in structure. Mild mitral valve regurgitation. No evidence of mitral valve stenosis. Tricuspid Valve: The tricuspid valve is normal in structure. Tricuspid valve regurgitation is mild . No evidence of tricuspid stenosis. Aortic Valve: The aortic valve is tricuspid. Aortic valve regurgitation is not visualized. No aortic stenosis is present. Aortic valve mean gradient measures 5.7 mmHg. Aortic valve peak gradient measures 13.1 mmHg. Aortic valve area, by VTI measures 1.69  cm. Pulmonic Valve: The pulmonic valve was not well visualized. Pulmonic valve regurgitation is not visualized. No evidence of pulmonic stenosis. Aorta: The aortic root is normal in size and structure. Pulmonary Artery: Mild pulmonary HTN, PASP is 27 mmHg. Venous: The inferior vena cava is dilated in size with greater than 50% respiratory variability, suggesting right atrial pressure of 8 mmHg.  IAS/Shunts: The interatrial septum was not well visualized. Additional Comments: There is a large pleural effusion in the left lateral  region.  LEFT VENTRICLE PLAX 2D LVIDd:         6.37 cm  Diastology LVIDs:         5.91 cm  LV e' medial:    3.81 cm/s LV PW:         1.24 cm  LV E/e' medial:  27.6 LV IVS:        1.02 cm  LV e' lateral:   9.65 cm/s LVOT diam:     2.30 cm  LV E/e' lateral: 10.9 LV SV:         41 LV SV Index:   22 LVOT Area:     4.15 cm  RIGHT VENTRICLE RV S prime:     6.97 cm/s LEFT ATRIUM              Index       RIGHT ATRIUM           Index LA diam:        4.20 cm  2.29 cm/m  RA Area:     28.20 cm LA Vol (A2C):   101.0 ml 54.95 ml/m RA Volume:   113.00 ml 61.48 ml/m LA Vol (A4C):   78.4 ml  42.65 ml/m LA Biplane Vol: 89.9 ml  48.91 ml/m  AORTIC VALVE AV Area (Vmax):    1.66 cm AV Area (Vmean):   1.74 cm AV Area (VTI):     1.69 cm AV Vmax:           180.98 cm/s AV Vmean:          111.041 cm/s AV VTI:            0.242 m AV Peak Grad:      13.1 mmHg AV Mean Grad:      5.7 mmHg LVOT Vmax:         72.41 cm/s LVOT Vmean:        46.635 cm/s LVOT VTI:          0.099 m LVOT/AV VTI ratio: 0.41  AORTA Ao Root diam: 3.50 cm MITRAL VALVE                TRICUSPID VALVE MV Area (PHT): 4.80 cm     TR Peak grad:   28.9 mmHg MV Decel Time: 158 msec     TR Vmax:        269.00 cm/s MR Peak grad: 59.3 mmHg MR Mean grad: 42.0 mmHg     SHUNTS MR Vmax:      385.00 cm/s   Systemic VTI:  0.10 m MR Vmean:     317.0 cm/s    Systemic Diam: 2.30 cm MV E velocity: 105.00 cm/s Carlyle Dolly MD Electronically signed by Carlyle Dolly MD Signature Date/Time: 09/17/2020/1:35:20 PM    Final         Scheduled Meds: . allopurinol  100 mg Oral Daily  . aspirin EC  81 mg Oral QHS  . Chlorhexidine Gluconate Cloth  6 each Topical Q0600  . heparin injection (subcutaneous)  5,000 Units Subcutaneous Q8H  . levothyroxine  175 mcg Oral Q0600  . metoprolol succinate  25 mg Oral Daily  . pantoprazole  40 mg Oral Daily  . pramipexole  0.25 mg Oral QHS   Continuous Infusions: . sodium chloride    . sodium chloride       LOS: 3 days  Hosie Poisson, MD [facility]   To contact the attending provider between 7A-7P or the covering provider during after hours 7P-7A, please log into the web site [email or web address] and access using universal [place] password for that web site. If you do not have the password, please call the hospital operator.  09/19/2020, 12:51 PM

## 2020-09-19 NOTE — Progress Notes (Signed)
Nephrology Follow-Up Consult note   Assessment/Recommendations: Jeremy Johnson is a/an 74 y.o. male with a past medical history significant for ESRD, CHF, small cell lung Ca, HTN, COPD, and CAD, admitted for SOB.     1.  CHF exacerbation: admitted for cardiology workup, EF severely decreased at 15-20%. Challenging EDW on HD.  Plan for cardiac catheterization tomorrow 2.  ESRD on TTS: [facility].    Tolerated hemodialysis on 11/6 with 2 L of ultrafiltration.  Continue dialysis per schedule 3.  HTN: on amlodipine, metop, and clonidine as OP- agree with holding BP meds for now 4.  Anemia: no ESA d/t malignancy 5.  BMD: will do calcitriol TIW 6.  Nutrition: renal vit with zinc, will see if biotin will help appetite 7.  Dispo: ctm  Dialysis orders:   TTS [facility] 4 hr via AVG EDW 66 kg, 3K 2.5 Ca bath BFR 400 DFR A1.5 Heparin 3000 u bolus Calcitriol 0.5 mcg TIW   Recommendations conveyed to primary service.    [facility] 09/19/2020 10:44 AM  ___________________________________________________________  CC: ESRD  Interval History/Subjective: Patient did not sleep well last night.  Dialysis ran late.  No specific complaints today otherwise   Medications:  Current Facility-Administered Medications  Medication Dose Route Frequency Provider Last Rate Last Admin  . 0.9 %  sodium chloride infusion  100 mL Intravenous PRN Madelon Lips, MD      . 0.9 %  sodium chloride infusion  100 mL Intravenous PRN Madelon Lips, MD      . albuterol (VENTOLIN HFA) 108 (90 Base) MCG/ACT inhaler 2 puff  2 puff Inhalation Q6H PRN Adefeso, Oladapo, DO      . allopurinol (ZYLOPRIM) tablet 100 mg  100 mg Oral Daily Hosie Poisson, MD   100 mg at 09/19/20 0916  . aspirin EC tablet 81 mg  81 mg Oral QHS Adefeso, Oladapo, DO   81 mg at 09/19/20 0031  . Chlorhexidine Gluconate Cloth 2 % PADS 6 each  6 each Topical Q0600 Madelon Lips, MD   6 each at 09/19/20  0656  . heparin injection 1,000 Units  1,000 Units Dialysis PRN Madelon Lips, MD      . heparin injection 3,000 Units  3,000 Units Dialysis PRN Madelon Lips, MD      . heparin injection 5,000 Units  5,000 Units Subcutaneous Q8H Hosie Poisson, MD   5,000 Units at 09/19/20 0656  . levothyroxine (SYNTHROID) tablet 175 mcg  175 mcg Oral Q0600 Adefeso, Oladapo, DO   175 mcg at 09/19/20 0656  . lidocaine (PF) (XYLOCAINE) 1 % injection 5 mL  5 mL Intradermal PRN Madelon Lips, MD      . lidocaine-prilocaine (EMLA) cream 1 application  1 application Topical PRN Madelon Lips, MD      . metoprolol succinate (TOPROL-XL) 24 hr tablet 25 mg  25 mg Oral Daily Arnoldo Lenis, MD   25 mg at 09/18/20 0923  . pantoprazole (PROTONIX) EC tablet 40 mg  40 mg Oral Daily Adefeso, Oladapo, DO   40 mg at 09/19/20 0916  . pentafluoroprop-tetrafluoroeth (GEBAUERS) aerosol 1 application  1 application Topical PRN Madelon Lips, MD      . pramipexole (MIRAPEX) tablet 0.25 mg  0.25 mg Oral QHS Adefeso, Oladapo, DO   0.25 mg at 09/19/20 0031      Review of Systems: 10 systems reviewed and negative except per interval history/subjective  Physical Exam: Vitals:   09/19/20 0913 09/19/20 1006  BP: 97/68 100/77  Pulse:  (!) 123  Resp:    Temp:    SpO2:     No intake/output data recorded.  Intake/Output Summary (Last 24 hours) at 09/19/2020 1044 Last data filed at 09/18/2020 2350 Gross per 24 hour  Intake --  Output 2004 ml  Net -2004 ml   Constitutional: well-appearing, no acute distress ENMT: ears and nose without scars or lesions, MMM CV: normal rate, no edema Respiratory: bilateral chest rise, normal work of breathing Gastrointestinal: soft, non-tender, no palpable masses or hernias Skin: no visible lesions or rashes Psych: alert, judgement/insight appropriate, appropriate mood and affect   Test Results I personally reviewed new and old clinical labs and radiology tests Lab Results   Component Value Date   NA 134 (L) 09/19/2020   K 4.6 09/19/2020   CL 94 (L) 09/19/2020   CO2 27 09/19/2020   BUN 29 (H) 09/19/2020   CREATININE 3.99 (H) 09/19/2020   CALCIUM 8.8 (L) 09/19/2020   ALBUMIN 2.7 (L) 09/19/2020   PHOS 7.7 (H) 09/18/2020

## 2020-09-20 ENCOUNTER — Encounter (HOSPITAL_COMMUNITY): Payer: Self-pay | Admitting: Internal Medicine

## 2020-09-20 ENCOUNTER — Other Ambulatory Visit: Payer: Self-pay

## 2020-09-20 ENCOUNTER — Encounter (HOSPITAL_COMMUNITY)
Admission: EM | Disposition: A | Discharge status: Home / Self Care | Payer: Self-pay | Source: Ambulatory Visit | Attending: Internal Medicine

## 2020-09-20 DIAGNOSIS — I2510 Atherosclerotic heart disease of native coronary artery without angina pectoris: Secondary | ICD-10-CM

## 2020-09-20 DIAGNOSIS — Z7189 Other specified counseling: Secondary | ICD-10-CM

## 2020-09-20 DIAGNOSIS — Z515 Encounter for palliative care: Secondary | ICD-10-CM

## 2020-09-20 DIAGNOSIS — I5021 Acute systolic (congestive) heart failure: Secondary | ICD-10-CM

## 2020-09-20 HISTORY — PX: CORONARY STENT INTERVENTION: CATH118234

## 2020-09-20 HISTORY — PX: RIGHT/LEFT HEART CATH AND CORONARY ANGIOGRAPHY: CATH118266

## 2020-09-20 HISTORY — PX: INTRAVASCULAR ULTRASOUND/IVUS: CATH118244

## 2020-09-20 LAB — CREATININE, SERUM
Creatinine, Ser: 7.06 mg/dL — ABNORMAL HIGH (ref 0.61–1.24)
GFR, Estimated: 8 mL/min — ABNORMAL LOW (ref >60)

## 2020-09-20 LAB — POCT I-STAT EG7
Acid-Base Excess: 4 mmol/L — ABNORMAL HIGH (ref 0.0–2.0)
Bicarbonate: 28.7 mmol/L — ABNORMAL HIGH (ref 20.0–28.0)
Calcium, Ion: 1.11 mmol/L — ABNORMAL LOW (ref 1.15–1.40)
HCT: 25 % — ABNORMAL LOW (ref 39.0–52.0)
Hemoglobin: 8.5 g/dL — ABNORMAL LOW (ref 13.0–17.0)
O2 Saturation: 68 %
Potassium: 4.9 mmol/L (ref 3.5–5.1)
Sodium: 134 mmol/L — ABNORMAL LOW (ref 135–145)
TCO2: 30 mmol/L (ref 22–32)
pCO2, Ven: 44.5 mmHg (ref 44.0–60.0)
pH, Ven: 7.417 (ref 7.250–7.430)
pO2, Ven: 35 mmHg (ref 32.0–45.0)

## 2020-09-20 LAB — POCT I-STAT 7, (LYTES, BLD GAS, ICA,H+H)
Acid-Base Excess: 4 mmol/L — ABNORMAL HIGH (ref 0.0–2.0)
Bicarbonate: 28.2 mmol/L — ABNORMAL HIGH (ref 20.0–28.0)
Calcium, Ion: 1.13 mmol/L — ABNORMAL LOW (ref 1.15–1.40)
HCT: 25 % — ABNORMAL LOW (ref 39.0–52.0)
Hemoglobin: 8.5 g/dL — ABNORMAL LOW (ref 13.0–17.0)
O2 Saturation: 98 %
Potassium: 4.9 mmol/L (ref 3.5–5.1)
Sodium: 133 mmol/L — ABNORMAL LOW (ref 135–145)
TCO2: 29 mmol/L (ref 22–32)
pCO2 arterial: 41.5 mmHg (ref 32.0–48.0)
pH, Arterial: 7.441 (ref 7.350–7.450)
pO2, Arterial: 101 mmHg (ref 83.0–108.0)

## 2020-09-20 LAB — HEPATIC FUNCTION PANEL
ALT: 816 U/L — ABNORMAL HIGH (ref 0–44)
AST: 553 U/L — ABNORMAL HIGH (ref 15–41)
Albumin: 2.8 g/dL — ABNORMAL LOW (ref 3.5–5.0)
Alkaline Phosphatase: 132 U/L — ABNORMAL HIGH (ref 38–126)
Bilirubin, Direct: 0.2 mg/dL (ref 0.0–0.2)
Indirect Bilirubin: 0.7 mg/dL (ref 0.3–0.9)
Total Bilirubin: 0.9 mg/dL (ref 0.3–1.2)
Total Protein: 7.2 g/dL (ref 6.5–8.1)

## 2020-09-20 LAB — CBC
HCT: 24.7 % — ABNORMAL LOW (ref 39.0–52.0)
Hemoglobin: 8.1 g/dL — ABNORMAL LOW (ref 13.0–17.0)
MCH: 31.8 pg (ref 26.0–34.0)
MCHC: 32.8 g/dL (ref 30.0–36.0)
MCV: 96.9 fL (ref 80.0–100.0)
Platelets: 114 10*3/uL — ABNORMAL LOW (ref 150–400)
RBC: 2.55 MIL/uL — ABNORMAL LOW (ref 4.22–5.81)
RDW: 15.7 % — ABNORMAL HIGH (ref 11.5–15.5)
WBC: 8.5 10*3/uL (ref 4.0–10.5)
nRBC: 9.3 % — ABNORMAL HIGH (ref 0.0–0.2)

## 2020-09-20 LAB — POCT ACTIVATED CLOTTING TIME
Activated Clotting Time: 466 seconds
Activated Clotting Time: 345 seconds
Activated Clotting Time: 131 seconds

## 2020-09-20 LAB — PROTIME-INR
INR: 1.4 — ABNORMAL HIGH (ref 0.8–1.2)
Prothrombin Time: 16.8 seconds — ABNORMAL HIGH (ref 11.4–15.2)

## 2020-09-20 SURGERY — RIGHT/LEFT HEART CATH AND CORONARY ANGIOGRAPHY
Anesthesia: LOCAL

## 2020-09-20 MED ORDER — ASPIRIN 81 MG PO CHEW: 81.0000 mg | CHEWABLE_TABLET | ORAL | Status: DC

## 2020-09-20 MED ORDER — FAMOTIDINE IN NACL 20-0.9 MG/50ML-% IV SOLN
INTRAVENOUS | Status: AC | PRN
  Administered 2020-09-20: 20 mg via INTRAVENOUS

## 2020-09-20 MED ORDER — HEPARIN SODIUM (PORCINE) 1000 UNIT/ML IJ SOLN
INTRAMUSCULAR | Status: AC
  Filled 2020-09-20: qty 1

## 2020-09-20 MED ORDER — FENTANYL CITRATE (PF) 100 MCG/2ML IJ SOLN
INTRAMUSCULAR | Status: DC | PRN
  Administered 2020-09-20 (×2): 25 ug via INTRAVENOUS

## 2020-09-20 MED ORDER — MIDAZOLAM HCL 2 MG/2ML IJ SOLN
INTRAMUSCULAR | Status: AC
  Filled 2020-09-20: qty 2

## 2020-09-20 MED ORDER — LIDOCAINE HCL (PF) 1 % IJ SOLN
INTRAMUSCULAR | Status: DC | PRN
  Administered 2020-09-20 (×2): 5 mL

## 2020-09-20 MED ORDER — HEPARIN (PORCINE) IN NACL 1000-0.9 UT/500ML-% IV SOLN
INTRAVENOUS | Status: AC
  Filled 2020-09-20: qty 1000

## 2020-09-20 MED ORDER — SODIUM CHLORIDE 0.9% FLUSH
3.0000 mL | Freq: Two times a day (BID) | INTRAVENOUS | Status: DC
  Administered 2020-09-20 – 2020-09-25 (×8): 3 mL via INTRAVENOUS

## 2020-09-20 MED ORDER — HEPARIN (PORCINE) IN NACL 1000-0.9 UT/500ML-% IV SOLN
INTRAVENOUS | Status: DC | PRN
  Administered 2020-09-20 (×3): 500 mL

## 2020-09-20 MED ORDER — SODIUM CHLORIDE 0.9 % IV SOLN: 250.0000 mL | INTRAVENOUS | Status: DC | PRN

## 2020-09-20 MED ORDER — MIDAZOLAM HCL 2 MG/2ML IJ SOLN
INTRAMUSCULAR | Status: DC | PRN
  Administered 2020-09-20: 1 mg via INTRAVENOUS

## 2020-09-20 MED ORDER — CLOPIDOGREL BISULFATE 75 MG PO TABS
75.0000 mg | ORAL_TABLET | Freq: Every day | ORAL | Status: DC
  Administered 2020-09-21 – 2020-09-25 (×5): 75 mg via ORAL
  Filled 2020-09-20 (×5): qty 1

## 2020-09-20 MED ORDER — ROSUVASTATIN CALCIUM 5 MG PO TABS
10.0000 mg | ORAL_TABLET | Freq: Every day | ORAL | Status: DC
  Administered 2020-09-20: 10 mg via ORAL
  Filled 2020-09-20: qty 2

## 2020-09-20 MED ORDER — LIDOCAINE HCL (PF) 1 % IJ SOLN
INTRAMUSCULAR | Status: AC
  Filled 2020-09-20: qty 30

## 2020-09-20 MED ORDER — HEPARIN SODIUM (PORCINE) 1000 UNIT/ML IJ SOLN
INTRAMUSCULAR | Status: DC | PRN
  Administered 2020-09-20: 7000 [IU] via INTRAVENOUS

## 2020-09-20 MED ORDER — SODIUM CHLORIDE 0.9 % IV SOLN
INTRAVENOUS | Status: AC | PRN
  Administered 2020-09-20: 10 mL/h via INTRAVENOUS

## 2020-09-20 MED ORDER — HEPARIN SODIUM (PORCINE) 5000 UNIT/ML IJ SOLN
5000.0000 [IU] | Freq: Three times a day (TID) | INTRAMUSCULAR | Status: DC
  Administered 2020-09-21: 5000 [IU] via SUBCUTANEOUS
  Filled 2020-09-20: qty 1

## 2020-09-20 MED ORDER — CLOPIDOGREL BISULFATE 300 MG PO TABS
ORAL_TABLET | ORAL | Status: AC
  Filled 2020-09-20: qty 2

## 2020-09-20 MED ORDER — SODIUM CHLORIDE 0.9% FLUSH: 3.0000 mL | INTRAVENOUS | Status: DC | PRN

## 2020-09-20 MED ORDER — HEPARIN (PORCINE) IN NACL 1000-0.9 UT/500ML-% IV SOLN
INTRAVENOUS | Status: AC
  Filled 2020-09-20: qty 500

## 2020-09-20 MED ORDER — SODIUM CHLORIDE 0.9 % IV SOLN: INTRAVENOUS | Status: DC

## 2020-09-20 MED ORDER — ASPIRIN 81 MG PO CHEW
81.0000 mg | CHEWABLE_TABLET | ORAL | Status: AC
  Administered 2020-09-20: 81 mg via ORAL
  Filled 2020-09-20: qty 1

## 2020-09-20 MED ORDER — FAMOTIDINE IN NACL 20-0.9 MG/50ML-% IV SOLN
INTRAVENOUS | Status: AC
  Filled 2020-09-20: qty 50

## 2020-09-20 MED ORDER — CLOPIDOGREL BISULFATE 300 MG PO TABS
ORAL_TABLET | ORAL | Status: DC | PRN
  Administered 2020-09-20: 600 mg via ORAL

## 2020-09-20 MED ORDER — FENTANYL CITRATE (PF) 100 MCG/2ML IJ SOLN
INTRAMUSCULAR | Status: AC
  Filled 2020-09-20: qty 2

## 2020-09-20 SURGICAL SUPPLY — 29 items
BALLN SAPPHIRE 2.5X15 (BALLOONS) ×2
BALLN SAPPHIRE ~~LOC~~ 3.5X15 (BALLOONS) ×1 IMPLANT
BALLN SAPPHIRE ~~LOC~~ 4.0X12 (BALLOONS) ×1 IMPLANT
BALLN WOLVERINE 3.00X10 (BALLOONS) ×2
BALLOON SAPPHIRE 2.5X15 (BALLOONS) IMPLANT
BALLOON WOLVERINE 3.00X10 (BALLOONS) IMPLANT
CATH INFINITI 5FR MULTPACK ANG (CATHETERS) ×1 IMPLANT
CATH LAUNCHER 6FR EBU 4 (CATHETERS) ×1 IMPLANT
CATH OPTICROSS HD (CATHETERS) ×1 IMPLANT
CATH SHOCKWAVE 3.0X12 (CATHETERS) IMPLANT
CATH SWAN GANZ 7F STRAIGHT (CATHETERS) ×1 IMPLANT
CATH TELESCOPE 6F GEC (CATHETERS) ×1 IMPLANT
CATHETER SHOCKWAVE 3.0X12 (CATHETERS) ×2
COVER SWIFTLINK CONNECTOR (BAG) ×1 IMPLANT
GUIDEWIRE .025 260CM (WIRE) ×1 IMPLANT
KIT ENCORE 26 ADVANTAGE (KITS) ×1 IMPLANT
KIT ESSENTIALS PG (KITS) ×1 IMPLANT
KIT HEART LEFT (KITS) ×2 IMPLANT
PACK CARDIAC CATHETERIZATION (CUSTOM PROCEDURE TRAY) ×2 IMPLANT
SHEATH PINNACLE 5F 10CM (SHEATH) ×1 IMPLANT
SHEATH PINNACLE 6F 10CM (SHEATH) ×1 IMPLANT
SHEATH PINNACLE 7F 10CM (SHEATH) ×1 IMPLANT
SLED PULL BACK IVUS (MISCELLANEOUS) ×1 IMPLANT
STENT RESOLUTE ONYX 3.0X34 (Permanent Stent) ×1 IMPLANT
STENT RESOLUTE ONYX 4.0X18 (Permanent Stent) ×1 IMPLANT
TRANSDUCER W/STOPCOCK (MISCELLANEOUS) ×2 IMPLANT
TUBING CIL FLEX 10 FLL-RA (TUBING) ×2 IMPLANT
WIRE ASAHI PROWATER 180CM (WIRE) ×1 IMPLANT
WIRE EMERALD 3MM-J .035X150CM (WIRE) ×1 IMPLANT

## 2020-09-20 NOTE — Consult Note (Signed)
Consultation Note Date: 09/20/2020   Patient Name: JAMICHEAL HEARD  DOB: 02-Apr-1946  MRN: [id number]  Age / Sex: 74 y.o., male  PCP: [name], MD Referring Physician: Hosie Poisson, MD  Reason for Consultation: Establishing goals of care  HPI/Patient Profile: 74 y.o. male  with past medical history of  ESRD (T-T-S) at [facility], CAD, COPD, HTN/HLD, small cell lung cancer, hypothyroidism admitted on 09/16/2020 with acute on chronic heart failure, CAD.   Clinical Assessment and Goals of Care: I have reviewed medical records including EPIC notes, labs and imaging, examined the patient and met at bedside to discuss diagnosis prognosis, GOC, EOL wishes, disposition and options.  Mr. Heidelberg is resting quietly in bed.  He greets me making and keeping eye contact.  He appears somewhat frail and thin, but is able to ambulate, meet his basic needs.  He is alert and oriented, able to make his needs known.  There is no family at bedside at this time.   I introduced Palliative Medicine as specialized medical care for people living with serious illness. It focuses on providing relief from the symptoms and stress of a serious illness.   We discussed a brief life review of the patient.  Mr. Mrs. Dygert have been married for 65 years.  As far as functional and nutritional status, Mr. Carn is independent with ADLs.  We discussed current illness and what it means in the larger context of on-going co-morbidities.  Mr. Capp seems knowledgeable about his health and treatment plan.  He shares that he would be going over to [facility] for cardiac cath later today.  Advanced directives, concepts specific to code status, were considered and discussed.  Mr. Saephan tells me that he would want CPR and intubation if warranted.  I asked how long he would like to be on life support, "2 days, 2 weeks, forever?".  Mr. Logan states that he would  not want to be on life support forever, but would accept a tracheostomy after 2 weeks of intubation if necessary to live.  I encouraged him greatly to discuss these choices with his wife/surrogate decision-maker.  I encourage Mr. Pitstick to continue discussions about goals of care with his wife.  I share that outpatient palliative services can assist with these discussions if he so desires.  At this point no outpatient palliative referral.  Questions and concerns were addressed.  The family was encouraged to call with questions or concerns.   Conference with attending related to patient condition, needs, goals of care.   HCPOA   NEXT OF KIN -Mr. Nixon names his wife of 6 years, Gabreal Worton, as his healthcare surrogate.    SUMMARY OF RECOMMENDATIONS   At this point continue full scope/full code. Unable to set limits on care/life support. States he would except a tracheostomy if warranted  Code Status/Advance Care Planning:  Full code  Symptom Management:   Per hospitalist, no additional needs at this time.  Palliative Prophylaxis:   Frequent Pain Assessment  Additional Recommendations (Limitations, Scope, Preferences):  Full Scope Treatment  Psycho-social/Spiritual:   Desire for further Chaplaincy support:no  Additional Recommendations: Caregiving  Support/Resources  Prognosis:   Unable to determine, based on outcomes.  1 year or more would not be surprising based on chronic illness burden, functional status.  Discharge Planning: To be determined, based on outcomes.  Anticipate home with home health.      Primary Diagnoses: Present on Admission: . Acquired hypothyroidism . HLD (hyperlipidemia) . Essential hypertension . GERD . Chronic obstructive pulmonary disease/emphysema . Small cell lung cancer ([place])   I have reviewed the medical record, interviewed the patient and family, and examined the patient. The following aspects are pertinent.  Past Medical  History:  Diagnosis Date  . Anemia   . Blood transfusion without reported diagnosis   . CAD (coronary artery disease)    STENT... MID CIRCUMFLEX...1997  . Chronic kidney disease    STAGE 3  . COPD (chronic obstructive pulmonary disease) ([place])   . Degenerative joint disease (DJD) of lumbar spine   . GERD (gastroesophageal reflux disease)   . Gout   . Hyperlipidemia   . Hypertension   . Hypothyroidism   . Incisional hernia    abdomen  . Leukocytosis    CHRONIC MILD  . Myocardial infarction ([place])    1997  . SCL CA dx'd 01/2019   Lung cancer   Social History   Socioeconomic History  . Marital status: Married    Spouse name: Not on file  . Number of children: 2  . Years of education: Not on file  . Highest education level: Not on file  Occupational History    Comment: retired  Tobacco Use  . Smoking status: Former Smoker    Packs/day: 0.50    Years: 54.00    Pack years: 27.00    Quit date: 09/17/2017    Years since quitting: 3.0  . Smokeless tobacco: Never Used  Vaping Use  . Vaping Use: Never used  Substance and Sexual Activity  . Alcohol use: Not Currently    Comment: occasional  . Drug use: No  . Sexual activity: Not Currently  Other Topics Concern  . Not on file  Social History Narrative  . Not on file   Social Determinants of Health   Financial Resource Strain:   . Difficulty of Paying Living Expenses: Not on file  Food Insecurity:   . Worried About Charity fundraiser in the Last Year: Not on file  . Ran Out of Food in the Last Year: Not on file  Transportation Needs:   . Lack of Transportation (Medical): Not on file  . Lack of Transportation (Non-Medical): Not on file  Physical Activity:   . Days of Exercise per Week: Not on file  . Minutes of Exercise per Session: Not on file  Stress:   . Feeling of Stress : Not on file  Social Connections:   . Frequency of Communication with Friends and Family: Not on file  . Frequency of Social Gatherings with  Friends and Family: Not on file  . Attends Religious Services: Not on file  . Active Member of Clubs or Organizations: Not on file  . Attends Archivist Meetings: Not on file  . Marital Status: Not on file   Family History  Problem Relation Age of Onset  . Stroke Brother   . Lung cancer Sister 40       lung cancer/former  . Colon cancer Neg Hx   . Colon polyps Neg  Hx    Scheduled Meds: . [MAR Hold] allopurinol  100 mg Oral Daily  . [MAR Hold] aspirin EC  81 mg Oral QHS  . [MAR Hold] Chlorhexidine Gluconate Cloth  6 each Topical Q0600  . [MAR Hold] heparin injection (subcutaneous)  5,000 Units Subcutaneous Q8H  . [MAR Hold] levothyroxine  175 mcg Oral Q0600  . [MAR Hold] metoprolol succinate  25 mg Oral Daily  . [MAR Hold] pantoprazole  40 mg Oral Daily  . [MAR Hold] pramipexole  0.25 mg Oral QHS   Continuous Infusions: . [MAR Hold] sodium chloride    . [MAR Hold] sodium chloride    . sodium chloride     PRN Meds:.[MAR Hold] sodium chloride, [MAR Hold] sodium chloride, [MAR Hold] albuterol, fentaNYL, Heparin (Porcine) in NaCl, [MAR Hold] heparin, [MAR Hold] heparin, [MAR Hold] lidocaine (PF), [MAR Hold] lidocaine-prilocaine, midazolam, [MAR Hold] pentafluoroprop-tetrafluoroeth Medications Prior to Admission:  Prior to Admission medications   Medication Sig Start Date End Date Taking? Authorizing Provider  acetaminophen (TYLENOL) 325 MG tablet Take 2 tablets (650 mg total) by mouth every 6 (six) hours as needed for mild pain (or Fever >/= 101). Patient taking differently: Take 1,300 mg by mouth every 6 (six) hours as needed for mild pain or headache (or Fever >/= 101). Arthritis strength 05/25/19  Yes Swayze, Ava, DO  albuterol (VENTOLIN HFA) 108 (90 Base) MCG/ACT inhaler Inhale 2 puffs into the lungs every 6 (six) hours as needed for wheezing or shortness of breath.  02/02/20  Yes [provider]  allopurinol (ZYLOPRIM) 100 MG tablet Take 1 tablet (100 mg total) by  mouth daily. Patient taking differently: Take 100 mg by mouth in the morning.  04/26/19  Yes Mercy Riding, MD  amLODipine (NORVASC) 10 MG tablet Take 10 mg by mouth at bedtime.  08/16/19  Yes [provider]  aspirin EC 81 MG tablet Take 81 mg by mouth at bedtime.   Yes [provider]  B Complex-C-Zn-Folic Acid (DIALYVITE 191-YNWG 15) 0.8 MG TABS Take 1 tablet by mouth daily. 04/22/20  Yes [provider]  Cyanocobalamin (VITAMIN B 12) 500 MCG TABS Take 1,000 mg by mouth daily.   Yes [provider]  Darbepoetin Alfa (ARANESP) 100 MCG/0.5ML SOSY injection Inject 0.5 mLs (100 mcg total) into the vein every Thursday with hemodialysis. 05/01/19  Yes Wendee Beavers T, MD  iron sucrose in sodium chloride 0.9 % 100 mL 50 mg. 04/06/20 04/05/21 Yes [provider]  levothyroxine (SYNTHROID, LEVOTHROID) 175 MCG tablet Take 175 mcg by mouth daily before breakfast.    Yes [provider]  lidocaine-prilocaine (EMLA) cream SMARTSIG:1 Topical Every Night 08/28/20  Yes [provider]  Methoxy PEG-Epoetin Beta (MIRCERA IJ) Inject 30 mg into the vein every 28 (twenty-eight) days.  03/23/20 03/22/21 Yes [provider]  metoprolol tartrate (LOPRESSOR) 50 MG tablet Take 25 mg by mouth 2 (two) times daily.    Yes [provider]  omeprazole (PRILOSEC) 40 MG capsule Take 1 capsule (40 mg total) by mouth in the morning and at bedtime. 07/27/20 09/16/20 Yes Carver, Charles K, DO  rosuvastatin (CRESTOR) 10 MG tablet Take 10 mg by mouth at bedtime. 01/30/20  Yes [provider]  Calcipotriene 0.005 % FOAM Apply to to body bid Patient not taking: Reported on 09/16/2020 08/05/20   Ralene Bathe, MD  pramipexole (MIRAPEX) 0.25 MG tablet Take 0.25 mg by mouth at bedtime. Patient not taking: Reported on 09/16/2020 03/31/20   [provider]  triamcinolone cream (KENALOG) 0.1 % Apply to aa's rash BID until clear then PRN thereafter. Avoid  face, groin, and axilla. Patient not taking: Reported on 09/16/2020 06/09/20   Ralene Bathe, MD   Allergies  Allergen Reactions  . Advair Hfa [Fluticasone-Salmeterol] Other (See Comments)    Developed thrush, although the mouth WAS being rinsed as directed  . Penicillins Rash    Has patient had a PCN reaction causing immediate rash, facial/tongue/throat swelling, SOB or lightheadedness with hypotension: No Has patient had a PCN reaction causing severe rash involving mucus membranes or skin necrosis: No Has patient had a PCN reaction that required hospitalization: No Has patient had a PCN reaction occurring within the last 10 years: No If all of the above answers are "NO", then may proceed with Cephalosporin use.    Review of Systems  Unable to perform ROS: Other    Physical Exam Vitals and nursing note reviewed.  Constitutional:      General: He is not in acute distress.    Appearance: He is normal weight. He is not ill-appearing.  Cardiovascular:     Rate and Rhythm: Normal rate.  Pulmonary:     Effort: Pulmonary effort is normal.  Musculoskeletal:     Right lower leg: No edema.     Left lower leg: No edema.  Skin:    General: Skin is warm and dry.     Findings: Ecchymosis present.  Neurological:     General: No focal deficit present.     Mental Status: He is alert and oriented to person, place, and time.  Psychiatric:        Mood and Affect: Mood normal.        Behavior: Behavior normal.     Vital Signs: BP 108/79 (BP Location: Right Arm)   Pulse (!) 113   Temp 98.6 F (37 C) (Oral)   Resp 20   Wt 64.4 kg   SpO2 100%   BMI 20.97 kg/m  Pain Scale: 0-10 POSS *See Group Information*: 1-Acceptable,Awake and alert Pain Score: 0-No pain   SpO2: SpO2: 100 % O2 Device:SpO2: 100 % O2 Flow Rate: .O2 Flow Rate (L/min): 2 L/min  IO: Intake/output summary: No intake or output data in the 24 hours ending 09/20/20 1335  LBM: Last BM Date: 09/19/20 Baseline  Weight: Weight: 64.8 kg Most recent weight: Weight: 64.4 kg     Palliative Assessment/Data:   Flowsheet Rows     Most Recent Value  Intake Tab  Referral Department Hospitalist  Unit at Time of Referral Cardiac/Telemetry Unit  Palliative Care Primary Diagnosis Cardiac  Date Notified 09/18/20  Palliative Care Type New Palliative care  Reason for referral Clarify Goals of Care  Date of Admission 09/16/20  Date first seen by Palliative Care 09/20/20  # of days Palliative referral response time 2 Day(s)  # of days IP prior to Palliative referral 2  Clinical Assessment  Palliative Performance Scale Score 60%  Pain Max last 24 hours Not able to report  Pain Min Last 24 hours Not able to report  Dyspnea Max Last 24 Hours Not able to report  Dyspnea Min Last 24 hours Not able to report  Psychosocial & Spiritual Assessment  Palliative Care Outcomes      Time In: 1030 Time Out: 1120 Time Total: 50 minutes  Greater than 50%  of this time was spent counseling and coordinating care related to the above assessment and plan.  Signed by: Elinda Bunten A  Hulan Fray, NP   Please contact Palliative Medicine Team phone at [phone] for questions and concerns.  For individual provider: See Shea Evans

## 2020-09-20 NOTE — Progress Notes (Signed)
PROGRESS NOTE    Jeremy Johnson  XBL:[id number] DOB: 01-28-1946 DOA: 09/16/2020 PCP: [name], MD   Chief Complaint  Patient presents with  . Shortness of Breath    Brief Narrative:   74 year old gentleman with prior history of end-stage renal disease on dialysis, coronary artery disease, COPD, hypertension, hyperlipidemia, hypothyroidism, history of small cell lung cancer presents to ED for worsening shortness of breath associated with tachycardia and tachypnea patient reports that he had dialysis on 11/4 and was on his way to his home when he felt short of breath. On admission his labs were significant for elevated BNP, minimally elevated troponins. CT chest without contrast did not show any pulmonary edema or opacity showed multiple pulmonary nodules which is similar to the old imaging. Currently is requiring up to 3 L of nasal cannula oxygen, tachypneic, tachycardic and not at baseline. ECHO showed severe Left ventricular ejection fraction, by estimation, is 15-20%. The left  ventricle has severely decreased function, with global hypokinesis. Left ventricular diastolic parameters are consistent with Grade III diastolic dysfunction (restrictive).  Cardiology on board and recommends [facility] on Monday. Nephrology on board.  Pt seen and examined at bedside, scheduled for cath later today.   Assessment & Plan:   Active Problems:   Acquired hypothyroidism   HLD (hyperlipidemia)   Essential hypertension   GERD   Small cell lung cancer (HCC)   ESRD (end stage renal disease) on dialysis (H[facility])   Shortness of breath   Chronic obstructive pulmonary disease/emphysema   Acute CHF (congestive heart failure) (HCC)   Restless leg syndrome   Elevated troponin I level    Acute systolic and diastolic heart failure   CT chest without contrast did not show any pulmonary edema or pneumonia. Echocardiogram done showed  Left ventricular ejection fraction, by estimation, is 15-20%. The left   ventricle has severely decreased function, with global hypokinesis. Left ventricular diastolic parameters are consistent with Grade III diastolic dysfunction (restrictive).  Cardiology on board and recommended cardiac cath on Monday. Patient currently denies any chest pain. Sob has improved , and oxygen requirement has improved from 3 lit/min to 2 lit/min. Pt remains tachycardic and tachypnea.  Fluid management as per HD as pt does not make urine.  Pt currently denies any chest pain.    ESRD on HD Last HD on 11/4.  Nephrology consulted. Underwent HD last night.    CAD:  Pt currently denies chest pain.  Resume aspirin, crestor on hold due to elevated liver enzymes.    Essential hypertension:  BP parameters are optimal.    Hypothyroidism:  Continue with Synthroid.   Hyperlipidemia Crestor on hold due to elevated liver enzymes.   Elevated liver enzymes Patient currently denies any nausea vomiting or abdominal pain. Statin is on hold. Ultrasound of the abdomen ordered for further evaluation, did not show any signs of acute cholecystitis.  Probably secondary to hepatic congestion.  Improving.  GI on board and appreciate recommendations.     GERD Continue with Protonix.   Restless leg syndrome continue with home medication.   Pulmonary nodules CT chest showed multiple pulmonary nodules similar to old imaging.  Anemia of chronic disease:  Probably from ESRD, will check iron studies. Iron is 187.  Baseline hemoglobin around 9, dropped to 7.6 this admission.  Repeat CBC in am.  Transfuse to keep hemoglobin greater than 7.  Pt denies any rectal bleeding at this time.     DVT prophylaxis: heparin  Code Status: (Full code.  Family Communication: none at bedside.  Disposition:   Status is: Inpatient  Remains inpatient appropriate because:Ongoing diagnostic testing needed not appropriate for outpatient work up, IV treatments appropriate due to intensity of illness or  inability to take PO and Inpatient level of care appropriate due to severity of illness   Dispo: The patient is from: Home              Anticipated d/c is to: pending.               Anticipated d/c date is: 2 days              Patient currently is not medically stable to d/c.       Consultants:   Cardiology.   Nephrology  Gastroenterology for elevated transaminases.   Procedures:  Echocardiogram.    Antimicrobials: none.    Subjective: Alert and comfortable.   Objective: Vitals:   09/20/20 0032 09/20/20 0500 09/20/20 0517 09/20/20 1200  BP: 107/75  103/76 108/79  Pulse: (!) 117  (!) 114 (!) 113  Resp: 20  20 20   Temp: 98.3 F (36.8 C)  98.3 F (36.8 C) 98.6 F (37 C)  TempSrc: Oral  Oral Oral  SpO2: 100%  100% 100%  Weight:  64.4 kg     No intake or output data in the 24 hours ending 09/20/20 1307 Filed Weights   09/18/20 1940 09/19/20 0500 09/20/20 0500  Weight: 64.8 kg 64.4 kg 64.4 kg    Examination:  General exam: Alert and comfortable, not in any distress Respiratory system: Air entry fair bilateral, no wheezing or rhonchi Cardiovascular system: S1-S2 heard, tachycardic, no pedal edema. Gastrointestinal system: Abdomen is soft, nontender nondistended, bowel sounds normal Central nervous system: Alert and oriented, grossly nonfocal Extremities: No pedal edema Skin: No rashes seen Psychiatry: Mood is appropriate  Data Reviewed: I have personally reviewed following labs and imaging studies  CBC: Recent Labs  Lab 09/16/20 1636 09/17/20 0524 09/18/20 2207 09/19/20 0559  WBC 8.5 7.3 11.4* 8.9  HGB 8.4* 7.6* 7.8* 8.1*  HCT 26.4* 23.6* 23.9* 24.9*  MCV 99.2 98.7 97.6 96.5  PLT 221 183 136* 123*    Basic Metabolic Panel: Recent Labs  Lab 09/16/20 1636 09/17/20 0524 09/18/20 2207 09/19/20 0559  NA 134* 134* 131* 134*  K 4.1 4.7 5.4* 4.6  CL 94* 95* 93* 94*  CO2 29 26 21* 27  GLUCOSE 118* 88 127* 94  BUN 20 30* 69* 29*  CREATININE  3.76* 4.96* 7.93* 3.99*  CALCIUM 8.7* 8.6* 8.9 8.8*  MG  --  2.0  --   --   PHOS  --  4.6 7.7*  --     GFR: Estimated Creatinine Clearance: 15 mL/min (A) (by C-G formula based on SCr of 3.99 mg/dL (H)).  Liver Function Tests: Recent Labs  Lab 09/17/20 0524 09/18/20 2207 09/19/20 0559 09/20/20 0719  AST 346*  --  1,073* 553*  ALT 196*  --  1,006* 816*  ALKPHOS 107  --  132* 132*  BILITOT 0.7  --  0.7 0.9  PROT 7.2  --  7.1 7.2  ALBUMIN 2.7* 2.7* 2.7* 2.8*    CBG: No results for input(s): GLUCAP in the last 168 hours.   Recent Results (from the past 240 hour(s))  Respiratory Panel by RT PCR (Flu A&B, Covid) - Nasopharyngeal Swab     Status: None   Collection Time: 09/16/20  4:29 PM   Specimen: Nasopharyngeal Swab  Result Value  Ref Range Status   SARS Coronavirus 2 by RT PCR NEGATIVE NEGATIVE Final    Comment: (NOTE) SARS-CoV-2 target nucleic acids are NOT DETECTED.  The SARS-CoV-2 RNA is generally detectable in upper respiratoy specimens during the acute phase of infection. The lowest concentration of SARS-CoV-2 viral copies this assay can detect is 131 copies/mL. A negative result does not preclude SARS-Cov-2 infection and should not be used as the sole basis for treatment or other patient management decisions. A negative result may occur with  improper specimen collection/handling, submission of specimen other than nasopharyngeal swab, presence of viral mutation(s) within the areas targeted by this assay, and inadequate number of viral copies (<131 copies/mL). A negative result must be combined with clinical observations, patient history, and epidemiological information. The expected result is Negative.  Fact Sheet for Patients:  PinkCheek.be  Fact Sheet for Healthcare Providers:  GravelBags.it  This test is no t yet approved or cleared by the [place] FDA and  has been authorized for detection  and/or diagnosis of SARS-CoV-2 by FDA under an Emergency Use Authorization (EUA). This EUA will remain  in effect (meaning this test can be used) for the duration of the COVID-19 declaration under Section 564(b)(1) of the Act, 21 U.S.C. section 360bbb-3(b)(1), unless the authorization is terminated or revoked sooner.     Influenza A by PCR NEGATIVE NEGATIVE Final   Influenza B by PCR NEGATIVE NEGATIVE Final    Comment: (NOTE) The Xpert Xpress SARS-CoV-2/FLU/RSV assay is intended as an aid in  the diagnosis of influenza from Nasopharyngeal swab specimens and  should not be used as a sole basis for treatment. Nasal washings and  aspirates are unacceptable for Xpert Xpress SARS-CoV-2/FLU/RSV  testing.  Fact Sheet for Patients: PinkCheek.be  Fact Sheet for Healthcare Providers: GravelBags.it  This test is not yet approved or cleared by the [place] FDA and  has been authorized for detection and/or diagnosis of SARS-CoV-2 by  FDA under an Emergency Use Authorization (EUA). This EUA will remain  in effect (meaning this test can be used) for the duration of the  Covid-19 declaration under Section 564(b)(1) of the Act, 21  U.S.C. section 360bbb-3(b)(1), unless the authorization is  terminated or revoked. Performed at [facility], [address] [place], [place] [address]   MRSA PCR Screening     Status: None   Collection Time: 09/17/20  8:41 PM   Specimen: Nasal Mucosa; Nasopharyngeal  Result Value Ref Range Status   MRSA by PCR NEGATIVE NEGATIVE Final    Comment:        The GeneXpert MRSA Assay (FDA approved for NASAL specimens only), is one component of a comprehensive MRSA colonization surveillance program. It is not intended to diagnose MRSA infection nor to guide or monitor treatment for MRSA infections. Performed at [facility], [address] [place], [place] [address]          Radiology Studies: No  results found.      Scheduled Meds: . allopurinol  100 mg Oral Daily  . aspirin EC  81 mg Oral QHS  . Chlorhexidine Gluconate Cloth  6 each Topical Q0600  . heparin injection (subcutaneous)  5,000 Units Subcutaneous Q8H  . levothyroxine  175 mcg Oral Q0600  . metoprolol succinate  25 mg Oral Daily  . pantoprazole  40 mg Oral Daily  . pramipexole  0.25 mg Oral QHS   Continuous Infusions: . sodium chloride    . sodium chloride    . sodium chloride  LOS: 4 days        Hosie Poisson, MD [facility]   To contact the attending provider between 7A-7P or the covering provider during after hours 7P-7A, please log into the web site [email or web address] and access using universal  password for that web site. If you do not have the password, please call the hospital operator.  09/20/2020, 1:07 PM

## 2020-09-20 NOTE — Interval H&P Note (Signed)
History and Physical Interval Note:  09/20/2020 1:25 PM  Jeremy Johnson  has presented today for surgery, with the diagnosis of heart failure.  The various methods of treatment have been discussed with the patient and family. After consideration of risks, benefits and other options for treatment, the patient has consented to  Procedure(s): RIGHT/LEFT HEART CATH AND CORONARY ANGIOGRAPHY (N/A) as a surgical intervention.  The patient's history has been reviewed, patient examined, no change in status, stable for surgery.  I have reviewed the patient's chart and labs.  Questions were answered to the patient's satisfaction.   Cath Lab Visit (complete for each Cath Lab visit)  Clinical Evaluation Leading to the Procedure:   ACS: No.  Non-ACS:    Anginal Classification: CCS II  Anti-ischemic medical therapy: Minimal Therapy (1 class of medications)  Non-Invasive Test Results: No non-invasive testing performed  Prior CABG: No previous CABG        Collier Salina [facility] 09/20/2020 1:25 PM

## 2020-09-20 NOTE — H&P (View-Only) (Signed)
Progress Note  Patient Name: Jeremy Johnson Date of Encounter: 09/20/2020  [facility] Cardiologist: Carlyle Dolly, MD   Subjective   Thinks his breathing is a little better. Anxious to have cath done  Inpatient Medications    Scheduled Meds: . allopurinol  100 mg Oral Daily  . aspirin EC  81 mg Oral QHS  . Chlorhexidine Gluconate Cloth  6 each Topical Q0600  . heparin injection (subcutaneous)  5,000 Units Subcutaneous Q8H  . levothyroxine  175 mcg Oral Q0600  . metoprolol succinate  25 mg Oral Daily  . pantoprazole  40 mg Oral Daily  . pramipexole  0.25 mg Oral QHS   Continuous Infusions: . sodium chloride    . sodium chloride     PRN Meds: sodium chloride, sodium chloride, albuterol, heparin, heparin, lidocaine (PF), lidocaine-prilocaine, pentafluoroprop-tetrafluoroeth   Vital Signs    Vitals:   09/19/20 2103 09/20/20 0032 09/20/20 0500 09/20/20 0517  BP: (!) 89/59 107/75  103/76  Pulse: (!) 117 (!) 117  (!) 114  Resp: 20 20  20   Temp: 97.8 F (36.6 C) 98.3 F (36.8 C)  98.3 F (36.8 C)  TempSrc: Oral Oral  Oral  SpO2: 97% 100%  100%  Weight:   64.4 kg     Intake/Output Summary (Last 24 hours) at 09/20/2020 0836 Last data filed at 09/19/2020 1300 Gross per 24 hour  Intake 480 ml  Output --  Net 480 ml   Last 3 Weights 09/20/2020 09/19/2020 09/18/2020  Weight (lbs) 141 lb 15.6 oz 141 lb 15.6 oz 142 lb 13.7 oz  Weight (kg) 64.4 kg 64.4 kg 64.8 kg      Telemetry    Sinus tachycardia - Personally Reviewed  ECG    Tracing from September 16, 2020 shows a sinus tachycardia with LVH and diffuse repolarization abnormalities.  Physical Exam    GEN: No acute distress.   Neck: Elevated JVD  Cardiac: RRR, no gallop.  Respiratory: Decreased breath sounds with few crackles GI: Soft, nontender, non-distended  MS: No edema; No deformity.  Labs    High Sensitivity Troponin:   Recent Labs  Lab 09/16/20 1649 09/16/20 1857  TROPONINIHS 79* 78*       Chemistry Recent Labs  Lab 09/17/20 0524 09/17/20 0524 09/18/20 2207 09/19/20 0559 09/20/20 0719  NA 134*  --  131* 134*  --   K 4.7  --  5.4* 4.6  --   CL 95*  --  93* 94*  --   CO2 26  --  21* 27  --   GLUCOSE 88  --  127* 94  --   BUN 30*  --  69* 29*  --   CREATININE 4.96*  --  7.93* 3.99*  --   CALCIUM 8.6*  --  8.9 8.8*  --   PROT 7.2  --   --  7.1 7.2  ALBUMIN 2.7*   < > 2.7* 2.7* 2.8*  AST 346*  --   --  1,073* 553*  ALT 196*  --   --  1,006* 816*  ALKPHOS 107  --   --  132* 132*  BILITOT 0.7  --   --  0.7 0.9  GFRNONAA 12*  --  7* 15*  --   ANIONGAP 13  --  17* 13  --    < > = values in this interval not displayed.     Hematology Recent Labs  Lab 09/17/20 0524 09/18/20 2207 09/19/20 0559  WBC  7.3 11.4* 8.9  RBC 2.39* 2.45* 2.58*  HGB 7.6* 7.8* 8.1*  HCT 23.6* 23.9* 24.9*  MCV 98.7 97.6 96.5  MCH 31.8 31.8 31.4  MCHC 32.2 32.6 32.5  RDW 15.7* 15.8* 15.3  PLT 183 136* 123*    BNP Recent Labs  Lab 09/16/20 1636  BNP >4,500.0*     Radiology    No results found.  Cardiac Studies   Result Date: 09/17/2020    ECHOCARDIOGRAM REPORT   Patient Name:   [name] Date of Exam: 09/17/2020 Medical Rec #:  [id number]       Height:       69.0 in Accession #:    [id number]      Weight:       151.9 lb Date of Birth:  12/07/1945      BSA:          1.838 m Patient Age:    74 years        BP:           108/74 mmHg Patient Gender: M               HR:           108 bpm. Exam Location:  [facility] Procedure: 2D Echo Indications:    CHF-Acute Systolic 573.22 / G25.42  History:        Patient has no prior history of Echocardiogram examinations.                 Risk Factors:Dyslipidemia and Former Smoker. ESRD, Small Cell                 Lung Cancer, Elevated Troponin.  Sonographer:    Leavy Cella RDCS (AE) Referring Phys: [id number] OLADAPO ADEFESO IMPRESSIONS  1. Left ventricular ejection fraction, by estimation, is 15-20%. The left ventricle has severely decreased  function. The left ventricle demonstrates global hypokinesis. The left ventricular internal cavity size was mildly dilated. Left ventricular diastolic parameters are consistent with Grade III diastolic dysfunction (restrictive). Elevated left atrial pressure.  2. Right ventricular systolic function is mildly reduced. The right ventricular size is normal.  3. Left atrial size was severely dilated.  4. Right atrial size was severely dilated.  5. Large pleural effusion in the left lateral region.  6. The mitral valve is normal in structure. Mild mitral valve regurgitation. No evidence of mitral stenosis.  7. The aortic valve is tricuspid. Aortic valve regurgitation is not visualized. No aortic stenosis is present.  8. The inferior vena cava is dilated in size with >50% respiratory variability, suggesting right atrial pressure of 8 mmHg. FINDINGS  Left Ventricle: LV global hypokinesis, the anteroseptal wall is akinetic. Left ventricular ejection fraction, by estimation, is 15-20%. The left ventricle has severely decreased function. The left ventricle demonstrates global hypokinesis. The left ventricular internal cavity size was mildly dilated. There is no left ventricular hypertrophy. Left ventricular diastolic parameters are consistent with Grade III diastolic dysfunction (restrictive). Elevated left atrial pressure. Right Ventricle: The right ventricular size is normal. Right vetricular wall thickness was not well visualized. Right ventricular systolic function is mildly reduced. Left Atrium: Left atrial size was severely dilated. Right Atrium: Right atrial size was severely dilated. Pericardium: There is no evidence of pericardial effusion. Mitral Valve: The mitral valve is normal in structure. Mild mitral valve regurgitation. No evidence of mitral valve stenosis. Tricuspid Valve: The tricuspid valve is normal in structure. Tricuspid valve regurgitation is mild . No evidence of  tricuspid stenosis. Aortic Valve: The  aortic valve is tricuspid. Aortic valve regurgitation is not visualized. No aortic stenosis is present. Aortic valve mean gradient measures 5.7 mmHg. Aortic valve peak gradient measures 13.1 mmHg. Aortic valve area, by VTI measures 1.69  cm. Pulmonic Valve: The pulmonic valve was not well visualized. Pulmonic valve regurgitation is not visualized. No evidence of pulmonic stenosis. Aorta: The aortic root is normal in size and structure. Pulmonary Artery: Mild pulmonary HTN, PASP is 27 mmHg. Venous: The inferior vena cava is dilated in size with greater than 50% respiratory variability, suggesting right atrial pressure of 8 mmHg. IAS/Shunts: The interatrial septum was not well visualized. Additional Comments: There is a large pleural effusion in the left lateral region.  LEFT VENTRICLE PLAX 2D LVIDd:         6.37 cm  Diastology LVIDs:         5.91 cm  LV e' medial:    3.81 cm/s LV PW:         1.24 cm  LV E/e' medial:  27.6 LV IVS:        1.02 cm  LV e' lateral:   9.65 cm/s LVOT diam:     2.30 cm  LV E/e' lateral: 10.9 LV SV:         41 LV SV Index:   22 LVOT Area:     4.15 cm  RIGHT VENTRICLE RV S prime:     6.97 cm/s LEFT ATRIUM              Index       RIGHT ATRIUM           Index LA diam:        4.20 cm  2.29 cm/m  RA Area:     28.20 cm LA Vol (A2C):   101.0 ml 54.95 ml/m RA Volume:   113.00 ml 61.48 ml/m LA Vol (A4C):   78.4 ml  42.65 ml/m LA Biplane Vol: 89.9 ml  48.91 ml/m  AORTIC VALVE AV Area (Vmax):    1.66 cm AV Area (Vmean):   1.74 cm AV Area (VTI):     1.69 cm AV Vmax:           180.98 cm/s AV Vmean:          111.041 cm/s AV VTI:            0.242 m AV Peak Grad:      13.1 mmHg AV Mean Grad:      5.7 mmHg LVOT Vmax:         72.41 cm/s LVOT Vmean:        46.635 cm/s LVOT VTI:          0.099 m LVOT/AV VTI ratio: 0.41  AORTA Ao Root diam: 3.50 cm MITRAL VALVE                TRICUSPID VALVE MV Area (PHT): 4.80 cm     TR Peak grad:   28.9 mmHg MV Decel Time: 158 msec     TR Vmax:        269.00 cm/s  MR Peak grad: 59.3 mmHg MR Mean grad: 42.0 mmHg     SHUNTS MR Vmax:      385.00 cm/s   Systemic VTI:  0.10 m MR Vmean:     317.0 cm/s    Systemic Diam: 2.30 cm MV E velocity: 105.00 cm/s Carlyle Dolly MD Electronically signed by Carlyle Dolly MD Signature Date/Time: 09/17/2020/1:35:20 PM  Final     Patient Profile     74 y.o. male with a hx of ESRD, CAD with prior stent who is being seen today for the evaluation of SOB at the request of Dr Karleen Hampshire.   Assessment & Plan    Acute systolic CHF fluid removal with hemodialysis, LVEF 15 to 20% on echo 09/17/2020 Dr. Harl Bowie recommended right and left heart catheterization once heart failure stabilized. Patient had 1.5-2L taken off on dialysis. Agreeable to right and left heart cath and possible PCI with Dr. Donneta Romberg noon today. Arrange transfer on hospitalist service. I have reviewed the risks, indications, and alternatives to angioplasty and stenting with the patient. Risks include but are not limited to bleeding, infection, vascular injury, stroke, myocardial infection, arrhythmia, kidney injury, radiation-related injury in the case of prolonged fluoroscopy use, emergency cardiac surgery, and death. The patient understands the risks of serious complication is low (<1%) and patient agrees to proceed.   CAD with history of prior circumflex stent 1997-no angina  Essential hypertension Norvasc stopped and Lopressor changed to Toprol  Limited stage small cell lung CA completed chemotherapy  Elevated LFTs thought secondary to hepatic congestion  Anemia hemoglobin 8.1 today was 7.6 on admission  Increased LFTs followed by Dr. Gala Romney possible hepatic congestion. Improved from yest.   For questions or updates, please contact [place] Please consult [email or web address] for contact info under      Signed, Ermalinda Barrios, PA-C  09/20/2020, 8:36 AM     Attending note:  Patient seen and examined.  I reviewed hospital course and consultation by Dr.  Harl Bowie.  Patient presents with acute systolic heart failure complicated by fluid overload, although with known ESRD on hemodialysis.  Follow-up echocardiogram reveals LVEF 15 to 20% range with right and left heart catheterization recommended to help guide further treatment options.  Additional history includes known CAD status post previous infarct and circumflex stent intervention in 1997, also small cell lung cancer status post chemotherapy.  On examination he is in no distress, persistently tachycardic, systolic blood pressure 062-694 range.  Lungs exhibit decreased breath sounds at the bases.  Cardiac exam with RRR, no obvious gallop, elevated JVP.  Pertinent lab work includes potassium 4.6, creatinine 3.99, AST 553, ALT 816, hemoglobin 8.1, platelets 123, INR 1.4.  Patient is being transferred to [facility] today in anticipation of a diagnostic right and left heart catheterization.  He may also benefit from advanced heart failure team consultation thereafter to guide treatment options.  He is being transferred on the hospitalist team, will need continued nephrology consultation to facilitate hemodialysis.  Please also note transaminitis, felt to be related to hepatic congestion per GI consultation.  Also chest CT demonstrating asymptomatic 4.6 cm proximal transverse aortic aneurysm.  Satira Sark, M.D., F.A.C.C.

## 2020-09-20 NOTE — Progress Notes (Addendum)
Progress Note  Patient Name: Jeremy Johnson Date of Encounter: 09/20/2020  [facility] Cardiologist: Carlyle Dolly, MD   Subjective   Thinks his breathing is a little better. Anxious to have cath done  Inpatient Medications    Scheduled Meds: . allopurinol  100 mg Oral Daily  . aspirin EC  81 mg Oral QHS  . Chlorhexidine Gluconate Cloth  6 each Topical Q0600  . heparin injection (subcutaneous)  5,000 Units Subcutaneous Q8H  . levothyroxine  175 mcg Oral Q0600  . metoprolol succinate  25 mg Oral Daily  . pantoprazole  40 mg Oral Daily  . pramipexole  0.25 mg Oral QHS   Continuous Infusions: . sodium chloride    . sodium chloride     PRN Meds: sodium chloride, sodium chloride, albuterol, heparin, heparin, lidocaine (PF), lidocaine-prilocaine, pentafluoroprop-tetrafluoroeth   Vital Signs    Vitals:   09/19/20 2103 09/20/20 0032 09/20/20 0500 09/20/20 0517  BP: (!) 89/59 107/75  103/76  Pulse: (!) 117 (!) 117  (!) 114  Resp: 20 20  20   Temp: 97.8 F (36.6 C) 98.3 F (36.8 C)  98.3 F (36.8 C)  TempSrc: Oral Oral  Oral  SpO2: 97% 100%  100%  Weight:   64.4 kg     Intake/Output Summary (Last 24 hours) at 09/20/2020 0836 Last data filed at 09/19/2020 1300 Gross per 24 hour  Intake 480 ml  Output --  Net 480 ml   Last 3 Weights 09/20/2020 09/19/2020 09/18/2020  Weight (lbs) 141 lb 15.6 oz 141 lb 15.6 oz 142 lb 13.7 oz  Weight (kg) 64.4 kg 64.4 kg 64.8 kg      Telemetry    Sinus tachycardia - Personally Reviewed  ECG    Tracing from September 16, 2020 shows a sinus tachycardia with LVH and diffuse repolarization abnormalities.  Physical Exam    GEN: No acute distress.   Neck: Elevated JVD  Cardiac: RRR, no gallop.  Respiratory: Decreased breath sounds with few crackles GI: Soft, nontender, non-distended  MS: No edema; No deformity.  Labs    High Sensitivity Troponin:   Recent Labs  Lab 09/16/20 1649 09/16/20 1857  TROPONINIHS 79* 78*       Chemistry Recent Labs  Lab 09/17/20 0524 09/17/20 0524 09/18/20 2207 09/19/20 0559 09/20/20 0719  NA 134*  --  131* 134*  --   K 4.7  --  5.4* 4.6  --   CL 95*  --  93* 94*  --   CO2 26  --  21* 27  --   GLUCOSE 88  --  127* 94  --   BUN 30*  --  69* 29*  --   CREATININE 4.96*  --  7.93* 3.99*  --   CALCIUM 8.6*  --  8.9 8.8*  --   PROT 7.2  --   --  7.1 7.2  ALBUMIN 2.7*   < > 2.7* 2.7* 2.8*  AST 346*  --   --  1,073* 553*  ALT 196*  --   --  1,006* 816*  ALKPHOS 107  --   --  132* 132*  BILITOT 0.7  --   --  0.7 0.9  GFRNONAA 12*  --  7* 15*  --   ANIONGAP 13  --  17* 13  --    < > = values in this interval not displayed.     Hematology Recent Labs  Lab 09/17/20 0524 09/18/20 2207 09/19/20 0559  WBC  7.3 11.4* 8.9  RBC 2.39* 2.45* 2.58*  HGB 7.6* 7.8* 8.1*  HCT 23.6* 23.9* 24.9*  MCV 98.7 97.6 96.5  MCH 31.8 31.8 31.4  MCHC 32.2 32.6 32.5  RDW 15.7* 15.8* 15.3  PLT 183 136* 123*    BNP Recent Labs  Lab 09/16/20 1636  BNP >4,500.0*     Radiology    No results found.  Cardiac Studies   Result Date: 09/17/2020    ECHOCARDIOGRAM REPORT   Patient Name:   Jeremy Johnson Date of Exam: 09/17/2020 Medical Rec #:  [id number]       Height:       69.0 in Accession #:    [id number]      Weight:       151.9 lb Date of Birth:  Mar 24, 1946      BSA:          1.838 m Patient Age:    74 years        BP:           108/74 mmHg Patient Gender: M               HR:           108 bpm. Exam Location:  [facility] Procedure: 2D Echo Indications:    CHF-Acute Systolic 314.97 / W26.37  History:        Patient has no prior history of Echocardiogram examinations.                 Risk Factors:Dyslipidemia and Former Smoker. ESRD, Small Cell                 Lung Cancer, Elevated Troponin.  Sonographer:    Leavy Cella RDCS (AE) Referring Phys: [id number] OLADAPO ADEFESO IMPRESSIONS  1. Left ventricular ejection fraction, by estimation, is 15-20%. The left ventricle has severely decreased  function. The left ventricle demonstrates global hypokinesis. The left ventricular internal cavity size was mildly dilated. Left ventricular diastolic parameters are consistent with Grade III diastolic dysfunction (restrictive). Elevated left atrial pressure.  2. Right ventricular systolic function is mildly reduced. The right ventricular size is normal.  3. Left atrial size was severely dilated.  4. Right atrial size was severely dilated.  5. Large pleural effusion in the left lateral region.  6. The mitral valve is normal in structure. Mild mitral valve regurgitation. No evidence of mitral stenosis.  7. The aortic valve is tricuspid. Aortic valve regurgitation is not visualized. No aortic stenosis is present.  8. The inferior vena cava is dilated in size with >50% respiratory variability, suggesting right atrial pressure of 8 mmHg. FINDINGS  Left Ventricle: LV global hypokinesis, the anteroseptal wall is akinetic. Left ventricular ejection fraction, by estimation, is 15-20%. The left ventricle has severely decreased function. The left ventricle demonstrates global hypokinesis. The left ventricular internal cavity size was mildly dilated. There is no left ventricular hypertrophy. Left ventricular diastolic parameters are consistent with Grade III diastolic dysfunction (restrictive). Elevated left atrial pressure. Right Ventricle: The right ventricular size is normal. Right vetricular wall thickness was not well visualized. Right ventricular systolic function is mildly reduced. Left Atrium: Left atrial size was severely dilated. Right Atrium: Right atrial size was severely dilated. Pericardium: There is no evidence of pericardial effusion. Mitral Valve: The mitral valve is normal in structure. Mild mitral valve regurgitation. No evidence of mitral valve stenosis. Tricuspid Valve: The tricuspid valve is normal in structure. Tricuspid valve regurgitation is mild . No evidence of  tricuspid stenosis. Aortic Valve: The  aortic valve is tricuspid. Aortic valve regurgitation is not visualized. No aortic stenosis is present. Aortic valve mean gradient measures 5.7 mmHg. Aortic valve peak gradient measures 13.1 mmHg. Aortic valve area, by VTI measures 1.69  cm. Pulmonic Valve: The pulmonic valve was not well visualized. Pulmonic valve regurgitation is not visualized. No evidence of pulmonic stenosis. Aorta: The aortic root is normal in size and structure. Pulmonary Artery: Mild pulmonary HTN, PASP is 27 mmHg. Venous: The inferior vena cava is dilated in size with greater than 50% respiratory variability, suggesting right atrial pressure of 8 mmHg. IAS/Shunts: The interatrial septum was not well visualized. Additional Comments: There is a large pleural effusion in the left lateral region.  LEFT VENTRICLE PLAX 2D LVIDd:         6.37 cm  Diastology LVIDs:         5.91 cm  LV e' medial:    3.81 cm/s LV PW:         1.24 cm  LV E/e' medial:  27.6 LV IVS:        1.02 cm  LV e' lateral:   9.65 cm/s LVOT diam:     2.30 cm  LV E/e' lateral: 10.9 LV SV:         41 LV SV Index:   22 LVOT Area:     4.15 cm  RIGHT VENTRICLE RV S prime:     6.97 cm/s LEFT ATRIUM              Index       RIGHT ATRIUM           Index LA diam:        4.20 cm  2.29 cm/m  RA Area:     28.20 cm LA Vol (A2C):   101.0 ml 54.95 ml/m RA Volume:   113.00 ml 61.48 ml/m LA Vol (A4C):   78.4 ml  42.65 ml/m LA Biplane Vol: 89.9 ml  48.91 ml/m  AORTIC VALVE AV Area (Vmax):    1.66 cm AV Area (Vmean):   1.74 cm AV Area (VTI):     1.69 cm AV Vmax:           180.98 cm/s AV Vmean:          111.041 cm/s AV VTI:            0.242 m AV Peak Grad:      13.1 mmHg AV Mean Grad:      5.7 mmHg LVOT Vmax:         72.41 cm/s LVOT Vmean:        46.635 cm/s LVOT VTI:          0.099 m LVOT/AV VTI ratio: 0.41  AORTA Ao Root diam: 3.50 cm MITRAL VALVE                TRICUSPID VALVE MV Area (PHT): 4.80 cm     TR Peak grad:   28.9 mmHg MV Decel Time: 158 msec     TR Vmax:        269.00 cm/s  MR Peak grad: 59.3 mmHg MR Mean grad: 42.0 mmHg     SHUNTS MR Vmax:      385.00 cm/s   Systemic VTI:  0.10 m MR Vmean:     317.0 cm/s    Systemic Diam: 2.30 cm MV E velocity: 105.00 cm/s Carlyle Dolly MD Electronically signed by Carlyle Dolly MD Signature Date/Time: 09/17/2020/1:35:20 PM  Final     Patient Profile     74 y.o. male with a hx of ESRD, CAD with prior stent who is being seen today for the evaluation of SOB at the request of Dr Karleen Hampshire.   Assessment & Plan    Acute systolic CHF fluid removal with hemodialysis, LVEF 15 to 20% on echo 09/17/2020 Dr. Harl Bowie recommended right and left heart catheterization once heart failure stabilized. Patient had 1.5-2L taken off on dialysis. Agreeable to right and left heart cath and possible PCI with Dr. Donneta Romberg noon today. Arrange transfer on hospitalist service. I have reviewed the risks, indications, and alternatives to angioplasty and stenting with the patient. Risks include but are not limited to bleeding, infection, vascular injury, stroke, myocardial infection, arrhythmia, kidney injury, radiation-related injury in the case of prolonged fluoroscopy use, emergency cardiac surgery, and death. The patient understands the risks of serious complication is low (<0%) and patient agrees to proceed.   CAD with history of prior circumflex stent 1997-no angina  Essential hypertension Norvasc stopped and Lopressor changed to Toprol  Limited stage small cell lung CA completed chemotherapy  Elevated LFTs thought secondary to hepatic congestion  Anemia hemoglobin 8.1 today was 7.6 on admission  Increased LFTs followed by Dr. Gala Romney possible hepatic congestion. Improved from yest.   For questions or updates, please contact [place] Please consult [email or web address] for contact info under      Signed, Ermalinda Barrios, PA-C  09/20/2020, 8:36 AM     Attending note:  Patient seen and examined.  I reviewed hospital course and consultation by Dr.  Harl Bowie.  Patient presents with acute systolic heart failure complicated by fluid overload, although with known ESRD on hemodialysis.  Follow-up echocardiogram reveals LVEF 15 to 20% range with right and left heart catheterization recommended to help guide further treatment options.  Additional history includes known CAD status post previous infarct and circumflex stent intervention in 1997, also small cell lung cancer status post chemotherapy.  On examination he is in no distress, persistently tachycardic, systolic blood pressure 300-923 range.  Lungs exhibit decreased breath sounds at the bases.  Cardiac exam with RRR, no obvious gallop, elevated JVP.  Pertinent lab work includes potassium 4.6, creatinine 3.99, AST 553, ALT 816, hemoglobin 8.1, platelets 123, INR 1.4.  Patient is being transferred to [facility] today in anticipation of a diagnostic right and left heart catheterization.  He may also benefit from advanced heart failure team consultation thereafter to guide treatment options.  He is being transferred on the hospitalist team, will need continued nephrology consultation to facilitate hemodialysis.  Please also note transaminitis, felt to be related to hepatic congestion per GI consultation.  Also chest CT demonstrating asymptomatic 4.6 cm proximal transverse aortic aneurysm.  Satira Sark, M.D., F.A.C.C.

## 2020-09-20 NOTE — Progress Notes (Signed)
Site area: Right groin a 6 french arterial,  and 7 french venous sheath were removed groin   Site Prior to Removal:  Level 0   Pressure Applied For 20 MINUTES     Bedrest Beginning at  1900   Manual:   Yes.     Patient Status During Pull:  stable   Post Pull Groin Site:  Level 0   Post Pull Instructions Given:  Yes.     Post Pull Pulses Present:  Yes.     Dressing Applied:  Yes.     Comments:

## 2020-09-20 NOTE — Progress Notes (Signed)
Patient ID: Jeremy Johnson, male   DOB: 12-12-1945, 74 y.o.   MRN: [id number] S: Feels well this morning.  No SOB.  Awaiting transfer to [facility] for cardiac cath O:BP 103/76 (BP Location: Right Arm)   Pulse (!) 114   Temp 98.3 F (36.8 C) (Oral)   Resp 20   Wt 64.4 kg   SpO2 100%   BMI 20.97 kg/m   Intake/Output Summary (Last 24 hours) at 09/20/2020 0852 Last data filed at 09/19/2020 1300 Gross per 24 hour  Intake 480 ml  Output --  Net 480 ml   Intake/Output: I/O last 3 completed shifts: In: 480 [P.O.:480] Out: 2004 [Other:2004]  Intake/Output this shift:  No intake/output data recorded. Weight change: -0.4 kg Gen: frail, cachectic WM in NAD CVS: tachy at 114 Resp: cta Abd: +BS, soft, Nt/ND Ext: no edema, LUE AVF +T/B  Recent Labs  Lab 09/16/20 1636 09/17/20 0524 09/18/20 2207 09/19/20 0559 09/20/20 0719  NA 134* 134* 131* 134*  --   K 4.1 4.7 5.4* 4.6  --   CL 94* 95* 93* 94*  --   CO2 29 26 21* 27  --   GLUCOSE 118* 88 127* 94  --   BUN 20 30* 69* 29*  --   CREATININE 3.76* 4.96* 7.93* 3.99*  --   ALBUMIN  --  2.7* 2.7* 2.7* 2.8*  CALCIUM 8.7* 8.6* 8.9 8.8*  --   PHOS  --  4.6 7.7*  --   --   AST  --  346*  --  1,073* 553*  ALT  --  196*  --  1,006* 816*   Liver Function Tests: Recent Labs  Lab 09/17/20 0524 09/17/20 0524 09/18/20 2207 09/19/20 0559 09/20/20 0719  AST 346*  --   --  1,073* 553*  ALT 196*  --   --  1,006* 816*  ALKPHOS 107  --   --  132* 132*  BILITOT 0.7  --   --  0.7 0.9  PROT 7.2  --   --  7.1 7.2  ALBUMIN 2.7*   < > 2.7* 2.7* 2.8*   < > = values in this interval not displayed.   No results for input(s): LIPASE, AMYLASE in the last 168 hours. No results for input(s): AMMONIA in the last 168 hours. CBC: Recent Labs  Lab 09/16/20 1636 09/16/20 1636 09/17/20 0524 09/18/20 2207 09/19/20 0559  WBC 8.5   < > 7.3 11.4* 8.9  HGB 8.4*   < > 7.6* 7.8* 8.1*  HCT 26.4*   < > 23.6* 23.9* 24.9*  MCV 99.2  --  98.7 97.6 96.5  PLT 221    < > 183 136* 123*   < > = values in this interval not displayed.   Cardiac Enzymes: No results for input(s): CKTOTAL, CKMB, CKMBINDEX, TROPONINI in the last 168 hours. CBG: No results for input(s): GLUCAP in the last 168 hours.  Iron Studies:  Recent Labs    09/19/20 0606  IRON 187*  TIBC 196*   Studies/Results: No results found. [name] allopurinol  100 mg Oral Daily  . aspirin EC  81 mg Oral QHS  . Chlorhexidine Gluconate Cloth  6 each Topical Q0600  . heparin injection (subcutaneous)  5,000 Units Subcutaneous Q8H  . levothyroxine  175 mcg Oral Q0600  . metoprolol succinate  25 mg Oral Daily  . pantoprazole  40 mg Oral Daily  . pramipexole  0.25 mg Oral QHS    BMET  Component Value Date/Time   NA 134 (L) 09/19/2020 0559   K 4.6 09/19/2020 0559   CL 94 (L) 09/19/2020 0559   CO2 27 09/19/2020 0559   GLUCOSE 94 09/19/2020 0559   BUN 29 (H) 09/19/2020 0559   CREATININE 3.99 (H) 09/19/2020 0559   CREATININE 3.69 (HH) 07/20/2020 1058   CALCIUM 8.8 (L) 09/19/2020 0559   GFRNONAA 15 (L) 09/19/2020 0559   GFRNONAA 15 (L) 07/20/2020 1058   GFRAA 18 (L) 07/20/2020 1058   CBC    Component Value Date/Time   WBC 8.9 09/19/2020 0559   RBC 2.58 (L) 09/19/2020 0559   HGB 8.1 (L) 09/19/2020 0559   HGB 9.3 (L) 07/20/2020 1058   HCT 24.9 (L) 09/19/2020 0559   HCT 17.5 (LL) 05/23/2019 0746   PLT 123 (L) 09/19/2020 0559   PLT 193 07/20/2020 1058   MCV 96.5 09/19/2020 0559   MCH 31.4 09/19/2020 0559   MCHC 32.5 09/19/2020 0559   RDW 15.3 09/19/2020 0559   LYMPHSABS 1.7 07/20/2020 1058   MONOABS 0.5 07/20/2020 1058   EOSABS 0.2 07/20/2020 1058   BASOSABS 0.1 07/20/2020 1058   Dialysis orders:  TTS [facility] 4 hr via AVG EDW 66 kg, 3K 2.5 Ca bath BFR 400 DFR A1.5 Heparin 3000 u bolus Calcitriol 0.5 mcg TIW  Assessment/Plan:  1. Acute on chronic CHF- EF decreased to 15-20% and Cardiology following.  For cardiac cath today 2. ESRD- continue with TTS schedule.    3. HTN/volume- he is 2 kg below edw, no edema 4. Anemia - no ESA due to malignancy 5. Abnormal LFT's- markedly elevated AST and ALT.  GI consulted.  [place] without biliary obstruction. 6. Moderate protein malnutrition- cont with supplements 7. BMD- cont with home meds  Donetta Potts, MD [facility] [phone]

## 2020-09-21 ENCOUNTER — Encounter (HOSPITAL_COMMUNITY): Payer: Self-pay | Admitting: Cardiology

## 2020-09-21 ENCOUNTER — Inpatient Hospital Stay (HOSPITAL_COMMUNITY): Payer: Medicare Other

## 2020-09-21 ENCOUNTER — Other Ambulatory Visit: Payer: Self-pay

## 2020-09-21 DIAGNOSIS — I5021 Acute systolic (congestive) heart failure: Secondary | ICD-10-CM | POA: Diagnosis not present

## 2020-09-21 DIAGNOSIS — E875 Hyperkalemia: Secondary | ICD-10-CM

## 2020-09-21 LAB — CBC
HCT: 26.3 % — ABNORMAL LOW (ref 39.0–52.0)
Hemoglobin: 8.6 g/dL — ABNORMAL LOW (ref 13.0–17.0)
MCH: 32.2 pg (ref 26.0–34.0)
MCHC: 32.7 g/dL (ref 30.0–36.0)
MCV: 98.5 fL (ref 80.0–100.0)
Platelets: 108 10*3/uL — ABNORMAL LOW (ref 150–400)
RBC: 2.67 MIL/uL — ABNORMAL LOW (ref 4.22–5.81)
RDW: 15.9 % — ABNORMAL HIGH (ref 11.5–15.5)
WBC: 9 10*3/uL (ref 4.0–10.5)
nRBC: 10.6 % — ABNORMAL HIGH (ref 0.0–0.2)

## 2020-09-21 LAB — BASIC METABOLIC PANEL
Anion gap: 18 — ABNORMAL HIGH (ref 5–15)
Anion gap: 18 — ABNORMAL HIGH (ref 5–15)
BUN: 75 mg/dL — ABNORMAL HIGH (ref 8–23)
BUN: 78 mg/dL — ABNORMAL HIGH (ref 8–23)
CO2: 21 mmol/L — ABNORMAL LOW (ref 22–32)
CO2: 23 mmol/L (ref 22–32)
Calcium: 8.8 mg/dL — ABNORMAL LOW (ref 8.9–10.3)
Calcium: 8.8 mg/dL — ABNORMAL LOW (ref 8.9–10.3)
Chloride: 92 mmol/L — ABNORMAL LOW (ref 98–111)
Chloride: 91 mmol/L — ABNORMAL LOW (ref 98–111)
Creatinine, Ser: 7.75 mg/dL — ABNORMAL HIGH (ref 0.61–1.24)
Creatinine, Ser: 8.31 mg/dL — ABNORMAL HIGH (ref 0.61–1.24)
GFR, Estimated: 7 mL/min — ABNORMAL LOW (ref >60)
GFR, Estimated: 6 mL/min — ABNORMAL LOW (ref >60)
Glucose, Bld: 114 mg/dL — ABNORMAL HIGH (ref 70–99)
Glucose, Bld: 112 mg/dL — ABNORMAL HIGH (ref 70–99)
Potassium: 6.1 mmol/L — ABNORMAL HIGH (ref 3.5–5.1)
Potassium: 5.8 mmol/L — ABNORMAL HIGH (ref 3.5–5.1)
Sodium: 131 mmol/L — ABNORMAL LOW (ref 135–145)
Sodium: 132 mmol/L — ABNORMAL LOW (ref 135–145)

## 2020-09-21 LAB — GLUCOSE, CAPILLARY: Glucose-Capillary: 113 mg/dL — ABNORMAL HIGH (ref 70–99)

## 2020-09-21 MED ORDER — DEXTROSE IN LACTATED RINGERS 5 % IV SOLN
INTRAVENOUS | Status: AC
  Filled 2020-09-21 (×3): qty 1000

## 2020-09-21 MED ORDER — ATORVASTATIN CALCIUM 40 MG PO TABS
40.0000 mg | ORAL_TABLET | Freq: Every day | ORAL | Status: DC
  Administered 2020-09-21 – 2020-09-25 (×5): 40 mg via ORAL
  Filled 2020-09-21 (×5): qty 1

## 2020-09-21 MED ORDER — INSULIN ASPART 100 UNIT/ML IV SOLN
10.0000 [IU] | Freq: Once | INTRAVENOUS | Status: AC
  Administered 2020-09-21: 10 [IU] via INTRAVENOUS

## 2020-09-21 MED ORDER — CALCIUM GLUCONATE-NACL 1-0.675 GM/50ML-% IV SOLN
1.0000 g | Freq: Once | INTRAVENOUS | Status: AC
  Administered 2020-09-21: 1000 mg via INTRAVENOUS
  Filled 2020-09-21: qty 50

## 2020-09-21 MED ORDER — ADENOSINE 6 MG/2ML IV SOLN
INTRAVENOUS | Status: AC
  Filled 2020-09-21: qty 4

## 2020-09-21 MED ORDER — HYDRALAZINE HCL 10 MG PO TABS
10.0000 mg | ORAL_TABLET | Freq: Three times a day (TID) | ORAL | Status: DC
  Administered 2020-09-21 – 2020-09-22 (×2): 10 mg via ORAL
  Filled 2020-09-21 (×3): qty 1

## 2020-09-21 MED ORDER — PROCHLORPERAZINE EDISYLATE 10 MG/2ML IJ SOLN: 10.0000 mg | Freq: Four times a day (QID) | INTRAMUSCULAR | Status: DC | PRN

## 2020-09-21 MED ORDER — ADENOSINE 6 MG/2ML IV SOLN
INTRAVENOUS | Status: AC
  Filled 2020-09-21: qty 2

## 2020-09-21 MED ORDER — HEPARIN SODIUM (PORCINE) 1000 UNIT/ML IJ SOLN
INTRAMUSCULAR | Status: AC
  Filled 2020-09-21: qty 3

## 2020-09-21 MED ORDER — APIXABAN 5 MG PO TABS
5.0000 mg | ORAL_TABLET | Freq: Two times a day (BID) | ORAL | Status: DC
  Administered 2020-09-21 – 2020-09-24 (×7): 5 mg via ORAL
  Filled 2020-09-21 (×7): qty 1

## 2020-09-21 NOTE — TOC Progression Note (Signed)
Transition of Care [facility]) - Progression Note    Patient Details  Name: Jeremy Johnson MRN: [id number] Date of Birth: Jan 11, 1946  Transition of Care [facility]) CM/SW Contact  Graves-Bigelow, Ocie Cornfield, RN Phone Number: 09/21/2020, 1:07 PM  Clinical Narrative: Case Manager spoke with patient- wife at the bedside. Patient uses oxygen qhs @ 3 liters from [place]. Plan will be to return home possibly Wednesday. Per patient and family; he will not need home health services. Case Manager will continue to follow for additional transition of care needs.     Expected Discharge Plan: Home/Self Care Barriers to Discharge: Continued Medical Work up  Expected Discharge Plan and Services Expected Discharge Plan: Home/Self Care In-house Referral: Clinical Social Work Discharge Planning Services: NA   Living arrangements for the past 2 months: Single Family Home                 DME Arranged: N/A DME Agency: NA       HH Arranged: NA HH Agency: NA    Readmission Risk Interventions Readmission Risk Prevention Plan 09/17/2020 05/03/2020  Transportation Screening Complete Complete  Medication Review Press photographer) Complete Complete  PCP or Specialist appointment within 3-5 days of discharge - Complete  HRI or [place] Complete Complete  SW Recovery Care/Counseling Consult Complete Complete  Palliative Care Screening Not Applicable Complete  [place] Not Applicable Complete  Some recent data might be hidden

## 2020-09-21 NOTE — Progress Notes (Signed)
   09/21/20 2016  Assess: MEWS Score  Temp 97.8 F (36.6 C)  BP 105/67  Pulse Rate (!) 113  Resp 20  SpO2 99 %  O2 Device Nasal Cannula  O2 Flow Rate (L/min) 2 L/min  Assess: MEWS Score  MEWS Temp 0  MEWS Systolic 0  MEWS Pulse 2  MEWS RR 0  MEWS LOC 0  MEWS Score 2  MEWS Score Color Yellow  Assess: if the MEWS score is Yellow or Red  Were vital signs taken at a resting state? Yes  Focused Assessment No change from prior assessment  Early Detection of Sepsis Score *See Row Information* Low  MEWS guidelines implemented *See Row Information* Yes  Treat  MEWS Interventions Escalated (See documentation below)  Pain Scale 0-10  Pain Score 0  Take Vital Signs  Increase Vital Sign Frequency  Yellow: Q 2hr X 2 then Q 4hr X 2, if remains yellow, continue Q 4hrs  Escalate  MEWS: Escalate Yellow: discuss with charge nurse/RN and consider discussing with provider and RRT  Notify: Charge Nurse/RN  Name of Charge Nurse/RN Notified Shanell RN  Date Charge Nurse/RN Notified 09/21/20  Time Charge Nurse/RN Notified 2022

## 2020-09-21 NOTE — Progress Notes (Signed)
   Reviewed continuous EKG from when Adenosine was pushed with EP APP, Tommye Standard PA-C who agrees that it looks like atrial flutter (likely 2:1 AV block when rates in the low 100s) or course atrial fibrillation. Continue with current plan.   Darreld Mclean, PA-C 09/21/2020 2:17 PM

## 2020-09-21 NOTE — Progress Notes (Signed)
Patient taken to HD.

## 2020-09-21 NOTE — Progress Notes (Signed)
PROGRESS NOTE    Jeremy Johnson  ERD:[id number] DOB: Dec 21, 1945 DOA: 09/16/2020  PCP: [name], MD    Brief Narrative:  74 year old gentleman with prior history of end-stage renal disease on dialysis, coronary artery disease, COPD, hypertension, hyperlipidemia, hypothyroidism, history of small cell lung cancer presents to ED for worsening shortness of breath associated with tachycardia and tachypnea patient reports that he had dialysis on 11/4 and was on his way to his home when he felt short of breath. On admission his labs were significant for elevated BNP, minimally elevated troponins. CT chest without contrast did not show any pulmonary edema or opacity showed multiple pulmonary nodules which is similar to the old imaging. Currently is requiring up to 3 L of nasal cannula oxygen, tachypneic, tachycardic and not at baseline. ECHO showed severe Left ventricular ejection fraction, by estimation, is 15-20%. The left  ventricle has severely decreased function, with global hypokinesis. Left ventricular diastolic parameters are consistent with Grade III diastolic dysfunction (restrictive).  Cardiology on board and recommends [facility] on Monday. Nephrology on board.  Pt seen and examined at bedside, scheduled for cath later today.   Assessment & Plan: Acute systolic and diastolic heart failure   CT chest without contrast did not show any pulmonary edema or pneumonia. Echocardiogram done showed  Left ventricular ejection fraction, by estimation, is 15-20%. The left  ventricle has severely decreased function, with global hypokinesis. Left ventricular diastolic parameters are consistent with Grade III diastolic dysfunction (restrictive).  Cardiology on board and recommended cardiac cath on Monday. Patient currently denies any chest pain. Sob has improved , and oxygen requirement has improved from 3 lit/min to 2 lit/min. Pt remains tachycardic and tachypnea.  Fluid management as per HD as pt does not  make urine.  Pt currently denies any chest pain.  09/20/20 Pt seen today and is s/o two stents and pt is chest pain free.  Pt cont on asa,plavix, and eliquis ,lipitor 40 mg and appreciate specialist management and care.d/w ct sx , to eval pt from consult placed at [facility] .   ESRD on HD Last HD on 11/4.  Nephrology consulted. Underwent HD last night.  Lab Results  Component Value Date   CREATININE 8.31 (H) 09/21/2020   CREATININE 7.75 (H) 09/21/2020   CREATININE 7.06 (H) 09/20/2020     CAD:  Pt currently denies chest pain.  Resume aspirin, crestor on hold due to elevated liver enzymes.    Essential hypertension:  BP parameters are optimal.    Hypothyroidism:  Continue with Synthroid.   Hyperlipidemia Crestor on hold due to elevated liver enzymes. Currently pt on lipitor.    Elevated liver enzymes Patient currently denies any nausea vomiting or abdominal pain. Statin is on hold. Ultrasound of the abdomen ordered for further evaluation, did not show any signs of acute cholecystitis.  Probably secondary to hepatic congestion.  Improving.  GI on board and appreciate recommendations.  Will repeat and follow.    GERD Continue with Protonix.   Restless leg syndrome continue with home medication.   Pulmonary nodules CT chest showed multiple pulmonary nodules similar to old imaging.  Anemia of chronic disease:  Probably from ESRD, will check iron studies. Iron is 187.  Baseline hemoglobin around 9, dropped to 7.6 this admission.  Repeat CBC in am.  Transfuse to keep hemoglobin greater than 7.  Pt denies any rectal bleeding at this time.  H/h is 8.6 and 26.3.  Hyperkalemia: S/p d5,insulin and calcium gluconate.,  S/p HD. Repeat  is 5.8     DVT prophylaxis: heparin  Code Status: (Full code.  Family Communication: none at bedside.  Disposition:   Status is: Inpatient  Remains inpatient appropriate because:Ongoing diagnostic testing  needed not appropriate for outpatient work up, IV treatments appropriate due to intensity of illness or inability to take PO and Inpatient level of care appropriate due to severity of illness   Dispo: The patient is from: Home  Anticipated d/c is to: pending.   Anticipated d/c date is: 2 days  Patient currently is not medically stable to d/c.       Consultants:   Cardiology.   Nephrology  Gastroenterology for elevated transaminases.   Procedures:  Echocardiogram.   Subjective: Pt seen today he had an elevated potassium and HD was sch for the afternoon and stat d5/insulinand calcium were given with repeat serum potassium .  Objective: Vitals:   09/21/20 0410 09/21/20 0801 09/21/20 0825 09/21/20 1146  BP: 109/73 107/76  103/75  Pulse: (!) 103 (!) 101  (!) 108  Resp: 18 18  18   Temp: 97.6 F (36.4 C) 97.7 F (36.5 C)  97.6 F (36.4 C)  TempSrc: Oral Oral  Oral  SpO2: 100% 100%  97%  Weight:      Height:   5\' 9"  (1.753 m)    SpO2: 97 % O2 Flow Rate (L/min): 1 L/min FiO2 (%): 32 %  Intake/Output Summary (Last 24 hours) at 09/21/2020 1333 Last data filed at 09/21/2020 1100 Gross per 24 hour  Intake 624 ml  Output --  Net 624 ml   Filed Weights   09/19/20 0500 09/20/20 0500 09/21/20 0359  Weight: 64.4 kg 64.4 kg 61.2 kg    Examination: Blood pressure 103/75, pulse (!) 108, temperature 97.6 F (36.4 C), temperature source Oral, resp. rate 18, height 5\' 9"  (1.753 m), weight 61.2 kg, SpO2 97 %. General exam: Appears calm and comfortable  HEENT:EOMI, perrl  Respiratory system: BL basilar crackles. Respiratory effort normal. Cardiovascular system: irregular . No JVD, murmurs, rubs, gallops or clicks. No pedal edema. Gastrointestinal system: Abdomen is nondistended, soft and nontender. No organomegaly or masses felt. Normal bowel sounds heard. Central nervous system: Alert and oriented.CN grossly intact No focal neurological  deficits. Extremities: pt moving all 4 ext and ambulating. Skin: No rashes, lesions or ulcers, but pale. Psychiatry: Judgement and insight appear normal. Mood & affect appropriate.    Data Reviewed: I have personally reviewed following labs and imaging studies  I/O last 3 completed shifts: In: 47 [I.V.:34; IV Piggyback:50] Out: -  Total I/O In: 540 [P.O.:240; I.V.:300] Out: -  Lab Results  Component Value Date   CREATININE 8.31 (H) 09/21/2020   CREATININE 7.75 (H) 09/21/2020   CREATININE 7.06 (H) 09/20/2020   CBC: Recent Labs  Lab 09/17/20 0524 09/17/20 0524 09/18/20 2207 09/18/20 2207 09/19/20 0559 09/20/20 1351 09/20/20 1355 09/20/20 1756 09/21/20 0504  WBC 7.3  --  11.4*  --  8.9  --   --  8.5 9.0  HGB 7.6*   < > 7.8*   < > 8.1* 8.5* 8.5* 8.1* 8.6*  HCT 23.6*   < > 23.9*   < > 24.9* 25.0* 25.0* 24.7* 26.3*  MCV 98.7  --  97.6  --  96.5  --   --  96.9 98.5  PLT 183  --  136*  --  123*  --   --  114* 108*   < > = values in this interval not displayed.  Basic Metabolic Panel: Recent Labs  Lab 09/17/20 0524 09/17/20 0524 09/18/20 2207 09/18/20 2207 09/19/20 0559 09/20/20 1351 09/20/20 1355 09/20/20 1756 09/21/20 0504 09/21/20 0757  NA 134*   < > 131*   < > 134* 134* 133*  --  131* 132*  K 4.7   < > 5.4*   < > 4.6 4.9 4.9  --  6.1* 5.8*  CL 95*  --  93*  --  94*  --   --   --  92* 91*  CO2 26  --  21*  --  27  --   --   --  21* 23  GLUCOSE 88  --  127*  --  94  --   --   --  114* 112*  BUN 30*  --  69*  --  29*  --   --   --  75* 78*  CREATININE 4.96*   < > 7.93*  --  3.99*  --   --  7.06* 7.75* 8.31*  CALCIUM 8.6*  --  8.9  --  8.8*  --   --   --  8.8* 8.8*  MG 2.0  --   --   --   --   --   --   --   --   --   PHOS 4.6  --  7.7*  --   --   --   --   --   --   --    < > = values in this interval not displayed.   GFR: Estimated Creatinine Clearance: 6.9 mL/min (A) (by C-G formula based on SCr of 8.31 mg/dL (H)). Liver Function Tests: Recent Labs  Lab  09/17/20 0524 09/18/20 2207 09/19/20 0559 09/20/20 0719  AST 346*  --  1,073* 553*  ALT 196*  --  1,006* 816*  ALKPHOS 107  --  132* 132*  BILITOT 0.7  --  0.7 0.9  PROT 7.2  --  7.1 7.2  ALBUMIN 2.7* 2.7* 2.7* 2.8*   No results for input(s): LIPASE, AMYLASE in the last 168 hours. No results for input(s): AMMONIA in the last 168 hours.  Coagulation Profile: Recent Labs  Lab 09/17/20 0524 09/20/20 0719  INR 1.3* 1.4*   Cardiac Enzymes: No results for input(s): CKTOTAL, CKMB, CKMBINDEX, TROPONINI in the last 168 hours. BNP (last 3 results) No results for input(s): PROBNP in the last 8760 hours. HbA1C: No results for input(s): HGBA1C in the last 72 hours. CBG: Recent Labs  Lab 09/21/20 1009  GLUCAP 113*   Lipid Profile: No results for input(s): CHOL, HDL, LDLCALC, TRIG, CHOLHDL, LDLDIRECT in the last 72 hours. Thyroid Function Tests: No results for input(s): TSH, T4TOTAL, FREET4, T3FREE, THYROIDAB in the last 72 hours. Anemia Panel: Recent Labs    09/19/20 0606  TIBC 196*  IRON 187*   Sepsis Labs: No results for input(s): PROCALCITON, LATICACIDVEN in the last 168 hours.  Recent Results (from the past 240 hour(s))  Respiratory Panel by RT PCR (Flu A&B, Covid) - Nasopharyngeal Swab     Status: None   Collection Time: 09/16/20  4:29 PM   Specimen: Nasopharyngeal Swab  Result Value Ref Range Status   SARS Coronavirus 2 by RT PCR NEGATIVE NEGATIVE Final    Comment: (NOTE) SARS-CoV-2 target nucleic acids are NOT DETECTED.  The SARS-CoV-2 RNA is generally detectable in upper respiratoy specimens during the acute phase of infection. The lowest concentration of SARS-CoV-2 viral copies this assay can detect  is 131 copies/mL. A negative result does not preclude SARS-Cov-2 infection and should not be used as the sole basis for treatment or other patient management decisions. A negative result may occur with  improper specimen collection/handling, submission of specimen  other than nasopharyngeal swab, presence of viral mutation(s) within the areas targeted by this assay, and inadequate number of viral copies (<131 copies/mL). A negative result must be combined with clinical observations, patient history, and epidemiological information. The expected result is Negative.  Fact Sheet for Patients:  PinkCheek.be  Fact Sheet for Healthcare Providers:  GravelBags.it  This test is no t yet approved or cleared by the [place] FDA and  has been authorized for detection and/or diagnosis of SARS-CoV-2 by FDA under an Emergency Use Authorization (EUA). This EUA will remain  in effect (meaning this test can be used) for the duration of the COVID-19 declaration under Section 564(b)(1) of the Act, 21 U.S.C. section 360bbb-3(b)(1), unless the authorization is terminated or revoked sooner.     Influenza A by PCR NEGATIVE NEGATIVE Final   Influenza B by PCR NEGATIVE NEGATIVE Final    Comment: (NOTE) The Xpert Xpress SARS-CoV-2/FLU/RSV assay is intended as an aid in  the diagnosis of influenza from Nasopharyngeal swab specimens and  should not be used as a sole basis for treatment. Nasal washings and  aspirates are unacceptable for Xpert Xpress SARS-CoV-2/FLU/RSV  testing.  Fact Sheet for Patients: PinkCheek.be  Fact Sheet for Healthcare Providers: GravelBags.it  This test is not yet approved or cleared by the [place] FDA and  has been authorized for detection and/or diagnosis of SARS-CoV-2 by  FDA under an Emergency Use Authorization (EUA). This EUA will remain  in effect (meaning this test can be used) for the duration of the  Covid-19 declaration under Section 564(b)(1) of the Act, 21  U.S.C. section 360bbb-3(b)(1), unless the authorization is  terminated or revoked. Performed at [facility], [address] [place], [place]  [address]   MRSA PCR Screening     Status: None   Collection Time: 09/17/20  8:41 PM   Specimen: Nasal Mucosa; Nasopharyngeal  Result Value Ref Range Status   MRSA by PCR NEGATIVE NEGATIVE Final    Comment:        The GeneXpert MRSA Assay (FDA approved for NASAL specimens only), is one component of a comprehensive MRSA colonization surveillance program. It is not intended to diagnose MRSA infection nor to guide or monitor treatment for MRSA infections. Performed at [facility], [address], [place], [place] [address]       Radiology Studies: CARDIAC CATHETERIZATION  Result Date: 09/20/2020  Prox LAD to Mid LAD lesion is 40% stenosed.  Mid Cx lesion is 50% stenosed.  Mid Cx to Dist Cx lesion is 90% stenosed.  Prox Cx lesion is 50% stenosed.  RV Branch lesion is 50% stenosed.  A drug-eluting stent was successfully placed using a STENT RESOLUTE ONYX 3.0X34.  Post intervention, there is a 0% residual stenosis.  Post intervention, there is a 0% residual stenosis.  Post intervention, there is a 0% residual stenosis.  A drug-eluting stent was successfully placed using a STENT RESOLUTE ONYX 4.0X18.  LV end diastolic pressure is normal.  Hemodynamic findings consistent with mild pulmonary hypertension.  1. Left dominant circulation 2. Severe single vessel obstructive CAD involving the proximal to mid LCx 3. Normal LV filling pressures 4. Mild pulmonary HTN 5. Normal cardiac output 6. Successful PCI of the proximal to mid LCx with  DES x 2 overlapping. Lesion modified with cutting balloon angioplasty and Shockwave therapy. Plan: DAPT for at least one year. Optimize medical therapy for CHF.      Current Facility-Administered Medications (Endocrine & Metabolic):  .  levothyroxine (SYNTHROID) tablet 175 mcg   Current Facility-Administered Medications (Cardiovascular):  .  atorvastatin (LIPITOR) tablet 40 mg .  hydrALAZINE (APRESOLINE) tablet 10 mg .  metoprolol succinate (TOPROL-XL)  24 hr tablet 25 mg   Current Facility-Administered Medications (Respiratory):  .  albuterol (VENTOLIN HFA) 108 (90 Base) MCG/ACT inhaler 2 puff   Current Facility-Administered Medications (Analgesics):  .  allopurinol (ZYLOPRIM) tablet 100 mg .  aspirin EC tablet 81 mg .  lidocaine (PF) (XYLOCAINE) 1 % injection 5 mL   Current Facility-Administered Medications (Hematological):  .  apixaban (ELIQUIS) tablet 5 mg .  clopidogrel (PLAVIX) tablet 75 mg .  heparin injection 1,000 Units .  heparin injection 3,000 Units   Current Facility-Administered Medications (Other):  .  0.9 %  sodium chloride infusion .  0.9 %  sodium chloride infusion .  0.9 %  sodium chloride infusion .  Chlorhexidine Gluconate Cloth 2 % PADS 6 each .  lidocaine-prilocaine (EMLA) cream 1 application .  pantoprazole (PROTONIX) EC tablet 40 mg .  pentafluoroprop-tetrafluoroeth (GEBAUERS) aerosol 1 application .  pramipexole (MIRAPEX) tablet 0.25 mg .  prochlorperazine (COMPAZINE) injection 10 mg .  sodium chloride flush (NS) 0.9 % injection 3 mL .  sodium chloride flush (NS) 0.9 % injection 3 mL  No current outpatient medications on file.  Anti-infectives (From admission, onward)   None      Scheduled Meds: . allopurinol  100 mg Oral Daily  . apixaban  5 mg Oral BID  . aspirin EC  81 mg Oral QHS  . atorvastatin  40 mg Oral Daily  . Chlorhexidine Gluconate Cloth  6 each Topical Q0600  . clopidogrel  75 mg Oral Q breakfast  . hydrALAZINE  10 mg Oral Q8H  . levothyroxine  175 mcg Oral Q0600  . metoprolol succinate  25 mg Oral Daily  . pantoprazole  40 mg Oral Daily  . pramipexole  0.25 mg Oral QHS  . sodium chloride flush  3 mL Intravenous Q12H   Continuous Infusions: . sodium chloride    . sodium chloride    . sodium chloride       LOS: 5 days    Para Skeans, MD [facility] Pager [phone] How to contact the [facility] Attending or Consulting provider [place] or covering provider  during after hours [place], for this patient?    1. Check the care team in [facility] and look for a) attending/consulting [facility] provider listed and b) the [facility] team listed 2. Log into [email or web address] and use [place]'s universal password to access. If you do not have the password, please contact the hospital operator. 3. Locate the [facility] provider you are looking for under [facility] and page to a number that you can be directly reached. 4. If you still have difficulty reaching the provider, please page the [facility] (Director on Call) for the Hospitalists listed on amion for assistance. [email or web address] Password [email or web address] 09/21/2020, 1:33 PM

## 2020-09-21 NOTE — Care Management Important Message (Signed)
Important Message  Patient Details  Name: SAMSON RALPH MRN: [id number] Date of Birth: 05-Jun-1946   Medicare Important Message Given:  Yes     Shelda Altes 09/21/2020, 11:03 AM

## 2020-09-21 NOTE — Progress Notes (Signed)
Patient ID: Jeremy Johnson, male   DOB: [birth year], 74 y.o.   MRN: [id number] S: s/p cardiac cath yesterday - PCI, asa, plavix, eliquis x 1 mo Wife at bedside today.  No new complaints.   O:BP 107/76 (BP Location: Right Arm)   Pulse (!) 101   Temp 97.7 F (36.5 C) (Oral)   Resp 18   Ht 5\' 9"  (1.753 m)   Wt 61.2 kg   SpO2 100%   BMI 19.94 kg/m   Intake/Output Summary (Last 24 hours) at 09/21/2020 1142 Last data filed at 09/21/2020 1100 Gross per 24 hour  Intake 624 ml  Output --  Net 624 ml   Intake/Output: I/O last 3 completed shifts: In: 10 [I.V.:34; IV Piggyback:50] Out: -   Intake/Output this shift:  Total I/O In: 540 [P.O.:240; I.V.:300] Out: -  Weight change: -3.164 kg Gen: frail, cachectic WM in NAD CVS: tachy Resp: cta Abd: +BS, soft, Nt/ND Ext: no edema, LUE AVF +T/B  Recent Labs  Lab 09/16/20 1636 09/16/20 1636 09/17/20 0524 09/18/20 2207 09/19/20 0559 09/20/20 0719 09/20/20 1351 09/20/20 1355 09/20/20 1756 09/21/20 0504 09/21/20 0757  NA 134*   < > 134* 131* 134*  --  134* 133*  --  131* 132*  K 4.1   < > 4.7 5.4* 4.6  --  4.9 4.9  --  6.1* 5.8*  CL 94*  --  95* 93* 94*  --   --   --   --  92* 91*  CO2 29  --  26 21* 27  --   --   --   --  21* 23  GLUCOSE 118*  --  88 127* 94  --   --   --   --  114* 112*  BUN 20  --  30* 69* 29*  --   --   --   --  75* 78*  CREATININE 3.76*  --  4.96* 7.93* 3.99*  --   --   --  7.06* 7.75* 8.31*  ALBUMIN  --   --  2.7* 2.7* 2.7* 2.8*  --   --   --   --   --   CALCIUM 8.7*  --  8.6* 8.9 8.8*  --   --   --   --  8.8* 8.8*  PHOS  --   --  4.6 7.7*  --   --   --   --   --   --   --   AST  --   --  346*  --  1,073* 553*  --   --   --   --   --   ALT  --   --  196*  --  1,006* 816*  --   --   --   --   --    < > = values in this interval not displayed.   Liver Function Tests: Recent Labs  Lab 09/17/20 0524 09/17/20 0524 09/18/20 2207 09/19/20 0559 09/20/20 0719  AST 346*  --   --  1,073* 553*  ALT 196*  --    --  1,006* 816*  ALKPHOS 107  --   --  132* 132*  BILITOT 0.7  --   --  0.7 0.9  PROT 7.2  --   --  7.1 7.2  ALBUMIN 2.7*   < > 2.7* 2.7* 2.8*   < > = values in this interval not displayed.   No results for input(s):  LIPASE, AMYLASE in the last 168 hours. No results for input(s): AMMONIA in the last 168 hours. CBC: Recent Labs  Lab 09/17/20 0524 09/17/20 0524 09/18/20 2207 09/18/20 2207 09/19/20 0559 09/20/20 1351 09/20/20 1355 09/20/20 1756 09/21/20 0504  WBC 7.3   < > 11.4*   < > 8.9  --   --  8.5 9.0  HGB 7.6*   < > 7.8*   < > 8.1*   < > 8.5* 8.1* 8.6*  HCT 23.6*   < > 23.9*   < > 24.9*   < > 25.0* 24.7* 26.3*  MCV 98.7  --  97.6  --  96.5  --   --  96.9 98.5  PLT 183   < > 136*   < > 123*  --   --  114* 108*   < > = values in this interval not displayed.   Cardiac Enzymes: No results for input(s): CKTOTAL, CKMB, CKMBINDEX, TROPONINI in the last 168 hours. CBG: Recent Labs  Lab 09/21/20 1009  GLUCAP 113*    Iron Studies:  Recent Labs    09/19/20 0606  IRON 187*  TIBC 196*   Studies/Results: CARDIAC CATHETERIZATION  Result Date: 09/20/2020  Prox LAD to Mid LAD lesion is 40% stenosed.  Mid Cx lesion is 50% stenosed.  Mid Cx to Dist Cx lesion is 90% stenosed.  Prox Cx lesion is 50% stenosed.  RV Branch lesion is 50% stenosed.  A drug-eluting stent was successfully placed using a STENT RESOLUTE ONYX 3.0X34.  Post intervention, there is a 0% residual stenosis.  Post intervention, there is a 0% residual stenosis.  Post intervention, there is a 0% residual stenosis.  A drug-eluting stent was successfully placed using a STENT RESOLUTE ONYX 4.0X18.  LV end diastolic pressure is normal.  Hemodynamic findings consistent with mild pulmonary hypertension.  1. Left dominant circulation 2. Severe single vessel obstructive CAD involving the proximal to mid LCx 3. Normal LV filling pressures 4. Mild pulmonary HTN 5. Normal cardiac output 6. Successful PCI of the proximal  to mid LCx with DES x 2 overlapping. Lesion modified with cutting balloon angioplasty and Shockwave therapy. Plan: DAPT for at least one year. Optimize medical therapy for CHF.   [name] adenosine      . adenosine      . allopurinol  100 mg Oral Daily  . apixaban  5 mg Oral BID  . aspirin EC  81 mg Oral QHS  . atorvastatin  40 mg Oral Daily  . Chlorhexidine Gluconate Cloth  6 each Topical Q0600  . clopidogrel  75 mg Oral Q breakfast  . hydrALAZINE  10 mg Oral Q8H  . levothyroxine  175 mcg Oral Q0600  . metoprolol succinate  25 mg Oral Daily  . pantoprazole  40 mg Oral Daily  . pramipexole  0.25 mg Oral QHS  . sodium chloride flush  3 mL Intravenous Q12H    BMET    Component Value Date/Time   NA 132 (L) 09/21/2020 0757   K 5.8 (H) 09/21/2020 0757   CL 91 (L) 09/21/2020 0757   CO2 23 09/21/2020 0757   GLUCOSE 112 (H) 09/21/2020 0757   BUN 78 (H) 09/21/2020 0757   CREATININE 8.31 (H) 09/21/2020 0757   CREATININE 3.69 (HH) 07/20/2020 1058   CALCIUM 8.8 (L) 09/21/2020 0757   GFRNONAA 6 (L) 09/21/2020 0757   GFRNONAA 15 (L) 07/20/2020 1058   GFRAA 18 (L) 07/20/2020 1058   CBC    Component  Value Date/Time   WBC 9.0 09/21/2020 0504   RBC 2.67 (L) 09/21/2020 0504   HGB 8.6 (L) 09/21/2020 0504   HGB 9.3 (L) 07/20/2020 1058   HCT 26.3 (L) 09/21/2020 0504   HCT 17.5 (LL) 05/23/2019 0746   PLT 108 (L) 09/21/2020 0504   PLT 193 07/20/2020 1058   MCV 98.5 09/21/2020 0504   MCH 32.2 09/21/2020 0504   MCHC 32.7 09/21/2020 0504   RDW 15.9 (H) 09/21/2020 0504   LYMPHSABS 1.7 07/20/2020 1058   MONOABS 0.5 07/20/2020 1058   EOSABS 0.2 07/20/2020 1058   BASOSABS 0.1 07/20/2020 1058   Dialysis orders:  TTS [facility] 4 hr via AVG EDW 66 kg, 3K 2.5 Ca bath BFR 400 DFR A1.5 Heparin 3000 u bolus Calcitriol 0.5 mcg TIW  Assessment/Plan:  1. Acute on chronic CHF- EF decreased to 15-20% and Cardiology following.  RHC vol up - UF with HD today.  S/p PCI yesterday, may have  cardioversion Thurs.  2. ESRD- continue with TTS schedule. HD today.    3. HTN/volume- he is 2 kg below edw, was vol up on RHC yesterday - attempt 2-2.5L with HD today. 4. Anemia - no ESA due to malignancy 5. Abnormal LFT's- markedly elevated AST and ALT.  GI consulted.  [place] without biliary obstruction. 6. Moderate protein malnutrition- cont with supplements 7. BMD- cont with home meds  Jannifer Hick MD [facility] Pager [phone]

## 2020-09-21 NOTE — Progress Notes (Signed)
Patient returned from HD at 1841hrs.

## 2020-09-21 NOTE — Discharge Instructions (Addendum)
Information about your medication: Plavix (anti-platelet agent)  Generic Name (Brand): clopidogrel (Plavix), once daily medication  PURPOSE: You are taking this medication along with aspirin to lower your chance of having a heart attack, stroke, or blood clots in your heart stent. These can be fatal. Plavix and aspirin help prevent platelets from sticking together and forming a clot that can block an artery or your stent.   Common SIDE EFFECTS you may experience include: bruising or bleeding more easily, shortness of breath  Do not stop taking PLAVIX without talking to the doctor who prescribes it for you. People who are treated with a stent and stop taking Plavix too soon, have a higher risk of getting a blood clot in the stent, having a heart attack, or dying. If you stop Plavix because of bleeding, or for other reasons, your risk of a heart attack or stroke may increase.   Avoid taking NSAID agents or anti-inflammatory medications such as ibuprofen, naproxen given increased bleed risk with plavix - can use acetaminophen (Tylenol) if needed for pain.  Avoid taking over the counter stomach medications omeprazole (Prilosec) or esomeprazole (Nexium) since these do interact and make plavix less effective - ask your pharmacist or doctor for alterative agents if needed for heartburn or GERD.   Tell all of your doctors and dentists that you are taking Plavix. They should talk to the doctor who prescribed Plavix for you before you have any surgery or invasive procedure.   Contact your health care provider if you experience: severe or uncontrollable bleeding, pink/red/brown urine, vomiting blood or vomit that looks like "coffee grounds", red or black stools (looks like tar), coughing up blood or blood clots ----------------------------------------------------------------------------------------------------------------------  Information on my medicine - ELIQUIS (apixaban)  Why was Eliquis prescribed  for you? Eliquis was prescribed for you to reduce the risk of a blood clot forming that can cause a stroke if you have a medical condition called atrial fibrillation (a type of irregular heartbeat).  What do You need to know about Eliquis ? Take your Eliquis TWICE DAILY - one tablet in the morning and one tablet in the evening with or without food. If you have difficulty swallowing the tablet whole please discuss with your pharmacist how to take the medication safely.  Take Eliquis exactly as prescribed by your doctor and DO NOT stop taking Eliquis without talking to the doctor who prescribed the medication.  Stopping may increase your risk of developing a stroke.  Refill your prescription before you run out.  After discharge, you should have regular check-up appointments with your healthcare provider that is prescribing your Eliquis.  In the future your dose may need to be changed if your kidney function or weight changes by a significant amount or as you get older.  What do you do if you miss a dose? If you miss a dose, take it as soon as you remember on the same day and resume taking twice daily.  Do not take more than one dose of ELIQUIS at the same time to make up a missed dose.  Important Safety Information A possible side effect of Eliquis is bleeding. You should call your healthcare provider right away if you experience any of the following: ? Bleeding from an injury or your nose that does not stop. ? Unusual colored urine (red or dark brown) or unusual colored stools (red or black). ? Unusual bruising for unknown reasons. ? A serious fall or if you hit your head (even if there  is no bleeding).  Some medicines may interact with Eliquis and might increase your risk of bleeding or clotting while on Eliquis. To help avoid this, consult your healthcare provider or pharmacist prior to using any new prescription or non-prescription medications, including herbals, vitamins,  non-steroidal anti-inflammatory drugs (NSAIDs) and supplements.  This website has more information on Eliquis (apixaban): http://www.eliquis.com/eliquis/home

## 2020-09-21 NOTE — Progress Notes (Addendum)
Progress Note  Patient Name: Jeremy Johnson Date of Encounter: 09/21/2020  [facility] Cardiologist: Carlyle Dolly, MD   Subjective   No acute overnight events. Patient notes abdominal pain this morning and a few episodes of diarrhea overnight. No chest pain. No shortness of breath - he thinks this is back to baseline.  Inpatient Medications    Scheduled Meds: . allopurinol  100 mg Oral Daily  . aspirin EC  81 mg Oral QHS  . Chlorhexidine Gluconate Cloth  6 each Topical Q0600  . clopidogrel  75 mg Oral Q breakfast  . heparin  5,000 Units Subcutaneous Q8H  . insulin aspart  10 Units Intravenous Once  . levothyroxine  175 mcg Oral Q0600  . metoprolol succinate  25 mg Oral Daily  . pantoprazole  40 mg Oral Daily  . pramipexole  0.25 mg Oral QHS  . rosuvastatin  10 mg Oral QHS  . sodium chloride flush  3 mL Intravenous Q12H   Continuous Infusions: . sodium chloride    . sodium chloride    . sodium chloride    . calcium gluconate    . dextrose 5% lactated ringers     PRN Meds: sodium chloride, sodium chloride, sodium chloride, albuterol, heparin, heparin, lidocaine (PF), lidocaine-prilocaine, pentafluoroprop-tetrafluoroeth, prochlorperazine, sodium chloride flush   Vital Signs    Vitals:   09/20/20 1820 09/20/20 2032 09/21/20 0359 09/21/20 0410  BP: 101/72 105/76  109/73  Pulse:  100  (!) 103  Resp: 17 20  18   Temp:  97.6 F (36.4 C)  97.6 F (36.4 C)  TempSrc:  Oral  Oral  SpO2: 100% 100%  100%  Weight:   61.2 kg     Intake/Output Summary (Last 24 hours) at 09/21/2020 0758 Last data filed at 09/20/2020 1600 Gross per 24 hour  Intake 84 ml  Output --  Net 84 ml   Last 3 Weights 09/21/2020 09/20/2020 09/19/2020  Weight (lbs) 135 lb 141 lb 15.6 oz 141 lb 15.6 oz  Weight (kg) 61.236 kg 64.4 kg 64.4 kg      Telemetry    Atrial flutter/fibrillation with rates mostly in the 100's to 110's. Briefly in the 130's. PACs noted. - Personally Reviewed  ECG     Atrial flutter, rate 105, with LVH and diffuse T wave inversion/biphasic T waves. Unchanged from prior EKG this admission. - Personally Reviewed  Physical Exam   GEN: No acute distress.   Neck: JVD elevated. Cardiac: Tachycardic with regular rhythm. No murmurs, rubs, or gallops. Right femoral cath site soft with no signs of hematoma. Respiratory: No increased work of breathing. Decreased breath sounds in bases. No significant wheezes, rhonchi, or rales. GI: Soft, non-tender, non-distended  MS: No lower extremity edema. No deformity. Neuro:  No focal deficits. Psych: Normal affect. Responds appropriately.  Labs    High Sensitivity Troponin:   Recent Labs  Lab 09/16/20 1649 09/16/20 1857  TROPONINIHS 79* 78*      Chemistry Recent Labs  Lab 09/17/20 0524 09/17/20 0524 09/18/20 2207 09/18/20 2207 09/19/20 0559 09/19/20 0559 09/20/20 0719 09/20/20 1351 09/20/20 1355 09/20/20 1756 09/21/20 0504  NA 134*   < > 131*   < > 134*   < >  --  134* 133*  --  131*  K 4.7   < > 5.4*   < > 4.6   < >  --  4.9 4.9  --  6.1*  CL 95*   < > 93*  --  94*  --   --   --   --   --  92*  CO2 26   < > 21*  --  27  --   --   --   --   --  21*  GLUCOSE 88   < > 127*  --  94  --   --   --   --   --  114*  BUN 30*   < > 69*  --  29*  --   --   --   --   --  75*  CREATININE 4.96*   < > 7.93*   < > 3.99*  --   --   --   --  7.06* 7.75*  CALCIUM 8.6*   < > 8.9  --  8.8*  --   --   --   --   --  8.8*  PROT 7.2  --   --   --  7.1  --  7.2  --   --   --   --   ALBUMIN 2.7*   < > 2.7*  --  2.7*  --  2.8*  --   --   --   --   AST 346*  --   --   --  1,073*  --  553*  --   --   --   --   ALT 196*  --   --   --  1,006*  --  816*  --   --   --   --   ALKPHOS 107  --   --   --  132*  --  132*  --   --   --   --   BILITOT 0.7  --   --   --  0.7  --  0.9  --   --   --   --   GFRNONAA 12*   < > 7*   < > 15*  --   --   --   --  8* 7*  ANIONGAP 13   < > 17*  --  13  --   --   --   --   --  18*   < > = values  in this interval not displayed.     Hematology Recent Labs  Lab 09/19/20 0559 09/20/20 1351 09/20/20 1355 09/20/20 1756 09/21/20 0504  WBC 8.9  --   --  8.5 9.0  RBC 2.58*  --   --  2.55* 2.67*  HGB 8.1*   < > 8.5* 8.1* 8.6*  HCT 24.9*   < > 25.0* 24.7* 26.3*  MCV 96.5  --   --  96.9 98.5  MCH 31.4  --   --  31.8 32.2  MCHC 32.5  --   --  32.8 32.7  RDW 15.3  --   --  15.7* 15.9*  PLT 123*  --   --  114* 108*   < > = values in this interval not displayed.    BNP Recent Labs  Lab 09/16/20 1636  BNP >4,500.0*     DDimer No results for input(s): DDIMER in the last 168 hours.   Radiology    CARDIAC CATHETERIZATION  Result Date: 09/20/2020  Prox LAD to Mid LAD lesion is 40% stenosed.  Mid Cx lesion is 50% stenosed.  Mid Cx to Dist Cx lesion is 90% stenosed.  Prox Cx lesion is 50% stenosed.  RV [employer]  lesion is 50% stenosed.  A drug-eluting stent was successfully placed using a STENT RESOLUTE ONYX 3.0X34.  Post intervention, there is a 0% residual stenosis.  Post intervention, there is a 0% residual stenosis.  Post intervention, there is a 0% residual stenosis.  A drug-eluting stent was successfully placed using a STENT RESOLUTE ONYX 4.0X18.  LV end diastolic pressure is normal.  Hemodynamic findings consistent with mild pulmonary hypertension.  1. Left dominant circulation 2. Severe single vessel obstructive CAD involving the proximal to mid LCx 3. Normal LV filling pressures 4. Mild pulmonary HTN 5. Normal cardiac output 6. Successful PCI of the proximal to mid LCx with DES x 2 overlapping. Lesion modified with cutting balloon angioplasty and Shockwave therapy. Plan: DAPT for at least one year. Optimize medical therapy for CHF.    Cardiac Studies   Echocardiogram 09/17/2020: Impressions: 1. Left ventricular ejection fraction, by estimation, is 15-20%. The left  ventricle has severely decreased function. The left ventricle demonstrates  global hypokinesis. The left  ventricular internal cavity size was mildly  dilated. Left ventricular  diastolic parameters are consistent with Grade III diastolic dysfunction  (restrictive). Elevated left atrial pressure.  2. Right ventricular systolic function is mildly reduced. The right  ventricular size is normal.  3. Left atrial size was severely dilated.  4. Right atrial size was severely dilated.  5. Large pleural effusion in the left lateral region.  6. The mitral valve is normal in structure. Mild mitral valve  regurgitation. No evidence of mitral stenosis.  7. The aortic valve is tricuspid. Aortic valve regurgitation is not  visualized. No aortic stenosis is present.  8. The inferior vena cava is dilated in size with >50% respiratory  variability, suggesting right atrial pressure of 8 mmHg.  _______________  Right/Left Heart Catheterization 09/20/2020:  Prox LAD to Mid LAD lesion is 40% stenosed.  Mid Cx lesion is 50% stenosed.  Mid Cx to Dist Cx lesion is 90% stenosed.  Prox Cx lesion is 50% stenosed.  RV Branch lesion is 50% stenosed.  A drug-eluting stent was successfully placed using a STENT RESOLUTE ONYX 3.0X34.  Post intervention, there is a 0% residual stenosis.  Post intervention, there is a 0% residual stenosis.  Post intervention, there is a 0% residual stenosis.  A drug-eluting stent was successfully placed using a STENT RESOLUTE ONYX 4.0X18.  LV end diastolic pressure is normal.  Hemodynamic findings consistent with mild pulmonary hypertension.   1. Left dominant circulation 2. Severe single vessel obstructive CAD involving the proximal to mid LCx 3. Normal LV filling pressures 4. Mild pulmonary HTN 5. Normal cardiac output 6. Successful PCI of the proximal to mid LCx with DES x 2 overlapping. Lesion modified with cutting balloon angioplasty and Shockwave therapy.   Plan: DAPT for at least one year. Optimize medical therapy for CHF.    Patient Profile     74 y.o.  male with history of CAD with prior stent to LCX in 1997, hypertension, hyperlipidemia, hypothyroidism, ESRD on dialysis T/Th/Sat, COPD, and lung cancer who Cardiology was asked to see for acute systolic CHF with EF of 72-53%. Patient was transferred to [facility] on 09/20/2020 for right/left heart catheterization.   Assessment & Plan    New Onset Acute Systolic CHF - Patient presented with shortness of breath, elevated JVD, and elevated LFTs but no lower extremity edema. - BNP markedly elevated at >4,500.  - Echo showed LVEF of 15-20% with global hypokinesis, grade 3 diastolic dysfunction, severe biatrial enlargement,  and mild MR. Large left pleural effusion also noted. - R/LHC showed severe single vessel CAD involving LCX s/p DES x2. LVEDP and LV filling pressures normal with mild pulmonary hypertension. - Volume status being managed with dialysis. - Continue Toprol-XL 25mg  daily.  - No ACEI/ARB/ARNI due to ESRD.  - Continue to monitor daily weights, strict I/O's, and renal function.  CAD - High-sensitivity troponin minimally elevated and flat at 79 >> 78. Not consistent with ACS. Likely demand ischemia in setting of CHF and underlying ESRD. - R/LHC showed severe single vessel CAD involving LCX s/p DES x2 with mild non-obstructive disease elsewhere.  - No angina. - No that patient is in atrial flutter/fibrillation, will need triple therapy with Aspirin, Plavix, and Eilquis for 1 month and then Aspirin can be discontinued. - Continue beta-blocker and statin.  Atrial Flutter/Fibrillation s/p RVR - EKG this morning shows atrial flutter with rates in the low 100's. Telemetry also shows possible intermittent atrial fibrillation. - Continue Toprol-XL 25mg  daily.  - Will start Eliquis 5mg  twice daily.  - Will plan TEE/DCCV (no room on board for tomorrow so will be scheduled for Thursday 09/23/2020 at 8:30am).  Hypertension - BP soft at times but stable. - Continue beta-blocker as  above.  Hyperlipidemia - Last LDL 77 in 04/2019. Goal<70 given CAD. - Given elevated LFTs, will not increase home statin to high-intensity dosing. Continue Crestor 10mg  daily. - Will repeat fasting lipid panel in the morning if patient still here.   Thoracic Aortic Aneurysm - CT showed stable focal dilatation of proximal transverse aorta measuring 4.6 cm. Unchanged from prior imaging.  - Continue to monitor as outpatient.   Otherwise, per primary team: - ESRD - Anemia - Elevated LFTs - Pulmonary nodules - Hypothyroidism - GERD - Abdominal pain/diarrhea  For questions or updates, please contact [place] Please consult [email or web address] for contact info under        Signed, Darreld Mclean, PA-C  09/21/2020, 7:58 AM

## 2020-09-21 NOTE — Progress Notes (Signed)
CARDIAC REHAB PHASE I   Stent and HF ed completed with pt and wife. Pt educated on importance of ASA and Plavix. Reviewed site care and restrictions. Pt given HF booklet. Deferred ambulation at this time due abdominal pain. Pt states some blood when he wiped and problems with Plavix in the past. RN made aware. Will refer to CRP II [place] to fulfill requirement.   [id number] Rufina Falco, RN BSN 09/21/2020 9:44 AM

## 2020-09-22 ENCOUNTER — Inpatient Hospital Stay (HOSPITAL_COMMUNITY): Payer: Medicare Other

## 2020-09-22 ENCOUNTER — Ambulatory Visit: Payer: Medicare Other | Admitting: Dermatology

## 2020-09-22 DIAGNOSIS — I4891 Unspecified atrial fibrillation: Secondary | ICD-10-CM

## 2020-09-22 DIAGNOSIS — I5021 Acute systolic (congestive) heart failure: Secondary | ICD-10-CM | POA: Diagnosis not present

## 2020-09-22 DIAGNOSIS — I4892 Unspecified atrial flutter: Secondary | ICD-10-CM

## 2020-09-22 HISTORY — PX: IR THORACENTESIS ASP PLEURAL SPACE W/IMG GUIDE: IMG5380

## 2020-09-22 LAB — COMPREHENSIVE METABOLIC PANEL
ALT: 506 U/L — ABNORMAL HIGH (ref 0–44)
AST: 218 U/L — ABNORMAL HIGH (ref 15–41)
Albumin: 2.6 g/dL — ABNORMAL LOW (ref 3.5–5.0)
Alkaline Phosphatase: 117 U/L (ref 38–126)
Anion gap: 13 (ref 5–15)
BUN: 34 mg/dL — ABNORMAL HIGH (ref 8–23)
CO2: 26 mmol/L (ref 22–32)
Calcium: 8.6 mg/dL — ABNORMAL LOW (ref 8.9–10.3)
Chloride: 96 mmol/L — ABNORMAL LOW (ref 98–111)
Creatinine, Ser: 5.37 mg/dL — ABNORMAL HIGH (ref 0.61–1.24)
GFR, Estimated: 11 mL/min — ABNORMAL LOW (ref >60)
Glucose, Bld: 103 mg/dL — ABNORMAL HIGH (ref 70–99)
Potassium: 4.2 mmol/L (ref 3.5–5.1)
Sodium: 135 mmol/L (ref 135–145)
Total Bilirubin: 1.2 mg/dL (ref 0.3–1.2)
Total Protein: 6.9 g/dL (ref 6.5–8.1)

## 2020-09-22 LAB — PROTIME-INR
INR: 2 — ABNORMAL HIGH (ref 0.8–1.2)
Prothrombin Time: 21.8 seconds — ABNORMAL HIGH (ref 11.4–15.2)

## 2020-09-22 MED ORDER — SODIUM CHLORIDE 0.9 % IV SOLN: INTRAVENOUS | Status: DC

## 2020-09-22 MED ORDER — ISOSORBIDE MONONITRATE ER 30 MG PO TB24
15.0000 mg | ORAL_TABLET | Freq: Every day | ORAL | Status: DC
  Filled 2020-09-22: qty 1

## 2020-09-22 MED ORDER — LIDOCAINE HCL (PF) 1 % IJ SOLN
INTRAMUSCULAR | Status: AC | PRN
  Administered 2020-09-22: 10 mL

## 2020-09-22 MED ORDER — LIDOCAINE HCL 1 % IJ SOLN
INTRAMUSCULAR | Status: AC
  Filled 2020-09-22: qty 20

## 2020-09-22 NOTE — Progress Notes (Signed)
   Patient scheduled for TEE/DCCV tomorrow for atrial flutter with RVR. After careful review of history and examination, the risks and benefits of transesophageal echocardiogram have been explained including risks of esophageal damage, perforation (1:10,000 risk), bleeding, pharyngeal hematoma as well as other potential complications associated with conscious sedation including aspiration, arrhythmia, respiratory failure and death. Alternatives to treatment were discussed, questions were answered. Patient is willing to proceed.   Of note, patient does not some dysphagia which has been going on for the past couple of months. He had a EGD on 07/27/2020 which showed food in the middle third of the esophagus but no achalasia or strictures. Did call and discuss with Dr. Paulita Fujita (GI). Given above recent EGD findings, he felt TEE would be okay.  Darreld Mclean, PA-C 09/22/2020 10:31 AM

## 2020-09-22 NOTE — Progress Notes (Signed)
Progress Note  Patient Name: Jeremy Johnson Date of Encounter: 09/22/2020  [facility] Cardiologist: Carlyle Dolly, MD   Subjective   Feeling ok this morning. Just came back from thoracentesis.  Inpatient Medications    Scheduled Meds: . allopurinol  100 mg Oral Daily  . apixaban  5 mg Oral BID  . aspirin EC  81 mg Oral QHS  . atorvastatin  40 mg Oral Daily  . Chlorhexidine Gluconate Cloth  6 each Topical Q0600  . clopidogrel  75 mg Oral Q breakfast  . hydrALAZINE  10 mg Oral Q8H  . levothyroxine  175 mcg Oral Q0600  . metoprolol succinate  25 mg Oral Daily  . pantoprazole  40 mg Oral Daily  . pramipexole  0.25 mg Oral QHS  . sodium chloride flush  3 mL Intravenous Q12H   Continuous Infusions: . sodium chloride    . sodium chloride    . sodium chloride     PRN Meds: sodium chloride, sodium chloride, sodium chloride, albuterol, heparin, heparin, lidocaine (PF), lidocaine-prilocaine, pentafluoroprop-tetrafluoroeth, prochlorperazine, sodium chloride flush   Vital Signs    Vitals:   09/22/20 0354 09/22/20 0359 09/22/20 0455 09/22/20 0634  BP: 99/68 102/66  106/75  Pulse: (!) 119 (!) 120    Resp: (!) 28 (!) 23    Temp: 98.1 F (36.7 C) 98 F (36.7 C)    TempSrc: Oral Oral    SpO2: 94% 96%    Weight:   59.7 kg   Height:        Intake/Output Summary (Last 24 hours) at 09/22/2020 0727 Last data filed at 09/21/2020 1900 Gross per 24 hour  Intake 780 ml  Output 2000 ml  Net -1220 ml   Last 3 Weights 09/22/2020 09/21/2020 09/21/2020  Weight (lbs) 131 lb 11.2 oz 150 lb 9.2 oz 155 lb  Weight (kg) 59.739 kg 68.3 kg 70.308 kg      Telemetry    Atrial flutter with intermittent paroxysmal atrial fibrillation. Rates in the 100's to 120's. PVC/couplets also noted. - Personally Reviewed  ECG    No new ECG tracing today. - Personally Reviewed  Physical Exam   GEN: No acute distress.   Neck: No JVD Cardiac: Tachycardic with regular rhythm. No murmurs,  rubs, or gallops.  Respiratory: No increased work of breathing. Decreased breath sounds in bases.  GI: Soft, non-tender, non-distended  MS: No lower extremity edema. No deformity. Neuro:  Non-focal deficits. Psych: Normal affect. Responds appropriately.  Labs    High Sensitivity Troponin:   Recent Labs  Lab 09/16/20 1649 09/16/20 1857  TROPONINIHS 79* 78*      Chemistry Recent Labs  Lab 09/17/20 0524 09/17/20 0524 09/18/20 2207 09/18/20 2207 09/19/20 0559 09/19/20 0559 09/20/20 0719 09/20/20 1351 09/20/20 1355 09/20/20 1756 09/21/20 0504 09/21/20 0757  NA 134*   < > 131*   < > 134*  --   --    < > 133*  --  131* 132*  K 4.7   < > 5.4*   < > 4.6  --   --    < > 4.9  --  6.1* 5.8*  CL 95*   < > 93*   < > 94*  --   --   --   --   --  92* 91*  CO2 26   < > 21*   < > 27  --   --   --   --   --  21* 23  GLUCOSE 88   < > 127*   < > 94  --   --   --   --   --  114* 112*  BUN 30*   < > 69*   < > 29*  --   --   --   --   --  75* 78*  CREATININE 4.96*   < > 7.93*   < > 3.99*   < >  --   --   --  7.06* 7.75* 8.31*  CALCIUM 8.6*   < > 8.9   < > 8.8*  --   --   --   --   --  8.8* 8.8*  PROT 7.2  --   --   --  7.1  --  7.2  --   --   --   --   --   ALBUMIN 2.7*   < > 2.7*  --  2.7*  --  2.8*  --   --   --   --   --   AST 346*  --   --   --  1,073*  --  553*  --   --   --   --   --   ALT 196*  --   --   --  1,006*  --  816*  --   --   --   --   --   ALKPHOS 107  --   --   --  132*  --  132*  --   --   --   --   --   BILITOT 0.7  --   --   --  0.7  --  0.9  --   --   --   --   --   GFRNONAA 12*   < > 7*   < > 15*   < >  --   --   --  8* 7* 6*  ANIONGAP 13   < > 17*   < > 13  --   --   --   --   --  18* 18*   < > = values in this interval not displayed.     Hematology Recent Labs  Lab 09/19/20 0559 09/20/20 1351 09/20/20 1355 09/20/20 1756 09/21/20 0504  WBC 8.9  --   --  8.5 9.0  RBC 2.58*  --   --  2.55* 2.67*  HGB 8.1*   < > 8.5* 8.1* 8.6*  HCT 24.9*   < > 25.0* 24.7*  26.3*  MCV 96.5  --   --  96.9 98.5  MCH 31.4  --   --  31.8 32.2  MCHC 32.5  --   --  32.8 32.7  RDW 15.3  --   --  15.7* 15.9*  PLT 123*  --   --  114* 108*   < > = values in this interval not displayed.    BNP Recent Labs  Lab 09/16/20 1636  BNP >4,500.0*     DDimer No results for input(s): DDIMER in the last 168 hours.   Radiology    CARDIAC CATHETERIZATION  Result Date: 09/20/2020  Prox LAD to Mid LAD lesion is 40% stenosed.  Mid Cx lesion is 50% stenosed.  Mid Cx to Dist Cx lesion is 90% stenosed.  Prox Cx lesion is 50% stenosed.  RV Branch lesion is 50% stenosed.  A drug-eluting stent was successfully placed using a STENT RESOLUTE ONYX  3.6O13.  Post intervention, there is a 0% residual stenosis.  Post intervention, there is a 0% residual stenosis.  Post intervention, there is a 0% residual stenosis.  A drug-eluting stent was successfully placed using a STENT RESOLUTE ONYX 4.0X18.  LV end diastolic pressure is normal.  Hemodynamic findings consistent with mild pulmonary hypertension.  1. Left dominant circulation 2. Severe single vessel obstructive CAD involving the proximal to mid LCx 3. Normal LV filling pressures 4. Mild pulmonary HTN 5. Normal cardiac output 6. Successful PCI of the proximal to mid LCx with DES x 2 overlapping. Lesion modified with cutting balloon angioplasty and Shockwave therapy. Plan: DAPT for at least one year. Optimize medical therapy for CHF.    Cardiac Studies   Echocardiogram 09/17/2020: Impressions: 1. Left ventricular ejection fraction, by estimation, is 15-20%. The left  ventricle has severely decreased function. The left ventricle demonstrates  global hypokinesis. The left ventricular internal cavity size was mildly  dilated. Left ventricular  diastolic parameters are consistent with Grade III diastolic dysfunction  (restrictive). Elevated left atrial pressure.  2. Right ventricular systolic function is mildly reduced. The right    ventricular size is normal.  3. Left atrial size was severely dilated.  4. Right atrial size was severely dilated.  5. Large pleural effusion in the left lateral region.  6. The mitral valve is normal in structure. Mild mitral valve  regurgitation. No evidence of mitral stenosis.  7. The aortic valve is tricuspid. Aortic valve regurgitation is not  visualized. No aortic stenosis is present.  8. The inferior vena cava is dilated in size with >50% respiratory  variability, suggesting right atrial pressure of 8 mmHg.  _______________  Right/Left Heart Catheterization 09/20/2020:  Prox LAD to Mid LAD lesion is 40% stenosed.  Mid Cx lesion is 50% stenosed.  Mid Cx to Dist Cx lesion is 90% stenosed.  Prox Cx lesion is 50% stenosed.  RV Branch lesion is 50% stenosed.  A drug-eluting stent was successfully placed using a STENT RESOLUTE ONYX 3.0X34.  Post intervention, there is a 0% residual stenosis.  Post intervention, there is a 0% residual stenosis.  Post intervention, there is a 0% residual stenosis.  A drug-eluting stent was successfully placed using a STENT RESOLUTE ONYX 4.0X18.  LV end diastolic pressure is normal.  Hemodynamic findings consistent with mild pulmonary hypertension.  1. Left dominant circulation 2. Severe single vessel obstructive CAD involving the proximal to mid LCx 3. Normal LV filling pressures 4. Mild pulmonary HTN 5. Normal cardiac output 6. Successful PCI of the proximal to mid LCx with DES x 2 overlapping. Lesion modified with cutting balloon angioplasty and Shockwave therapy.   Plan: DAPT for at least one year. Optimize medical therapy for CHF.   Patient Profile     74 y.o. male with a history of CAD with prior stent to LCX in 1997, hypertension, hyperlipidemia, hypothyroidism, ESRD on dialysis T/Th/Sat, COPD, and lung cancer who Cardiology was asked to see for acute systolic CHF with EF of 08-65%. Patient was transferred to [facility]  on 09/20/2020 for right/left heart catheterization.  Assessment & Plan    New Onset Acute Systolic CHF - Patient presented with shortness of breath, elevated JVD, and elevated LFTs but no lower extremity edema. - BNP markedly elevated at >4,500.  - Echo showed LVEF of 15-20% with global hypokinesis, grade 3 diastolic dysfunction, severe biatrial enlargement, and mild MR. Large left pleural effusion also noted. - R/LHC showed severe single vessel CAD involving  LCX s/p DES x2. LVEDP and LV filling pressures normal with mild pulmonary hypertension. - Volume status being managed with dialysis. - Continue Toprol-XL 25mg  daily.  - Hydralazine 10mg  three times daily started yesterday and BP tolerating this. - Continue to monitor daily weights, strict I/O's, and renal function.  Left Pleural Effusion - S/p left thoracentesis today.  CAD - High-sensitivity troponin minimally elevated and flat at 79 >> 78. Not consistent with ACS. Likely demand ischemia in setting of CHF and underlying ESRD. - R/LHC showed severe single vessel CAD involving LCX s/p DES x2 with mild non-obstructive disease elsewhere.  - No angina.  - Now that patient is in atrial flutter/fibrillation, will need triple therapy with Aspirin, Plavix, and Eilquis for 1 month and then Aspirin can be discontinued. - Continue beta-blocker and statin.  Atrial Flutter/Fibrillation s/p RVR - Noted to be in 2:1 atrial flutter yesterday with intermitted atrial fibrillation as well. Adenosine was given and confirmed atrial flutter (possibly course atrial fibrillation). These EKGs were reviewed with Dr. Quentin Ore (EP) who agreed that it looks like atrial flutter. - Continue Toprol-XL 25mg  daily.  - Continue Eliquis 5mg  twice daily.  - Plan is for TEE/DCCV tomorrow (no room on board today). Patient does have dysphagia and had a recent EGD in 07/2020 which showed no achalasia or strictures but did have food in middle third of esophagus. We will  discuss with GI and make sure that they do not have any concerns with [place] proceeding with TEE.  Hypertension - BP soft at times but stable. - Continue Toprol-XL and Hydralazine as above.  Hyperlipidemia - Last LDL 77 in 04/2019. Goal<70 given CAD. - Home Crestor switched to Lipitor 40mg  daily yesterday. - Will repeat fasting lipid panel tomorrow.  Thoracic Aortic Aneurysm - CT showed stable focal dilatation of proximal transverse aorta measuring 4.6 cm. Unchanged from prior imaging.  - Continue to monitor as outpatient.   Otherwise, per primary team: - ESRD - Anemia - Elevated LFTs - Pulmonary nodules - Hypothyroidism - GERD - Abdominal pain/diarrhea  For questions or updates, please contact [place] Please consult [email or web address] for contact info under        Signed, Darreld Mclean, PA-C  09/22/2020, 7:27 AM

## 2020-09-22 NOTE — Progress Notes (Signed)
   09/22/20 1540  Assess: MEWS Score  Temp 97.9 F (36.6 C)  BP 96/64  ECG Heart Rate (!) 117  Resp (!) 24  Level of Consciousness Alert  SpO2 97 %  O2 Device Room Air  Patient Activity (if Appropriate) In chair  Assess: MEWS Score  MEWS Temp 0  MEWS Systolic 1  MEWS Pulse 2  MEWS RR 1  MEWS LOC 0  MEWS Score 4  MEWS Score Color Red  Assess: if the MEWS score is Yellow or Red  Were vital signs taken at a resting state? Yes  Focused Assessment No change from prior assessment  Early Detection of Sepsis Score *See Row Information* Low  MEWS guidelines implemented *See Row Information* Yes  Treat  MEWS Interventions Other (Comment) (MD aware, medications changed)  Pain Scale 0-10  Pain Score 0  Take Vital Signs  Increase Vital Sign Frequency  Red: Q 1hr X 4 then Q 4hr X 4, if remains red, continue Q 4hrs  Escalate  MEWS: Escalate Red: discuss with charge nurse/RN and provider, consider discussing with RRT  Notify: Charge Nurse/RN  Name of Charge Nurse/RN Notified Christy  Date Charge Nurse/RN Notified 09/22/20  Time Charge Nurse/RN Notified 1800  Notify: Provider  Provider Name/Title Rizwan  Date Provider Notified 09/22/20  Time Provider Notified 1540  Notification Type Page  Notification Reason Other (Comment) (patient's BP soft, BP meds were due)  Response See new orders (D/C BP meds)  Date of Provider Response 09/22/20  Time of Provider Response 1545  Notify: Rapid Response  Name of Rapid Response RN Notified N/A  Document  Patient Outcome Other (Comment) (Stable, continue to monitor, patient asymptomatic)

## 2020-09-22 NOTE — Procedures (Signed)
PROCEDURE SUMMARY:  Successful US guided left thoracentesis. Yielded 650 ml of clear yellow fluid. Pt tolerated procedure well. No immediate complications.  CXR ordered; no post-procedure pneumothorax identified.   EBL < 2 mL  Theresa Duty, NP 09/22/2020 9:50 AM

## 2020-09-22 NOTE — Progress Notes (Signed)
CARDIAC REHAB PHASE I   PRE:  Rate/Rhythm: 103 Afib  BP:  Sitting: 86/55      SaO2: 96 RA  MODE:  Ambulation: to chair  POST:  Rate/Rhythm: 104 afib  BP:  Sitting: 87/50    SaO2: 95 RA   Pt helped OOB to recliner. Pt denies pain, dizziness, or SOB. Pt looks some stronger than yesterday. Plan for cardioversion and HD tomorrow. Will continue to follow and encourage OOB as much as able.  [place], RN BSN 09/22/2020 2:38 PM

## 2020-09-22 NOTE — Progress Notes (Signed)
Patient ID: Jeremy Johnson, male   DOB: [birth year], 74 y.o.   MRN: [id number] S: Tolerated HD yesterday UF 2L.  Adenosine push yesterday showed A flutter underlying. Wife at bedside today.  No new complaints. Had thoracentesis (therapeutic) this AM - says ~1L removed but doesn't feel it helped breathing much.   O:BP 106/75   Pulse (!) 120   Temp 98 F (36.7 C) (Oral)   Resp (!) 23   Ht 5\' 9"  (1.753 m)   Wt 59.7 kg   SpO2 96%   BMI 19.45 kg/m   Intake/Output Summary (Last 24 hours) at 09/22/2020 0808 Last data filed at 09/21/2020 1900 Gross per 24 hour  Intake 540 ml  Output 2000 ml  Net -1460 ml   Intake/Output: I/O last 3 completed shifts: In: 780 [P.O.:480; I.V.:300] Out: 2000 [Other:2000]  Intake/Output this shift:  No intake/output data recorded. Weight change: 9.072 kg Gen: frail, cachectic WM in NAD CVS: tachy Resp: cta Abd: +BS, soft, Nt/ND Ext: no edema, LUE AVF +T/B   Recent Labs  Lab 09/16/20 1636 09/16/20 1636 09/17/20 0524 09/18/20 2207 09/19/20 0559 09/20/20 0719 09/20/20 1351 09/20/20 1355 09/20/20 1756 09/21/20 0504 09/21/20 0757  NA 134*   < > 134* 131* 134*  --  134* 133*  --  131* 132*  K 4.1   < > 4.7 5.4* 4.6  --  4.9 4.9  --  6.1* 5.8*  CL 94*  --  95* 93* 94*  --   --   --   --  92* 91*  CO2 29  --  26 21* 27  --   --   --   --  21* 23  GLUCOSE 118*  --  88 127* 94  --   --   --   --  114* 112*  BUN 20  --  30* 69* 29*  --   --   --   --  75* 78*  CREATININE 3.76*  --  4.96* 7.93* 3.99*  --   --   --  7.06* 7.75* 8.31*  ALBUMIN  --   --  2.7* 2.7* 2.7* 2.8*  --   --   --   --   --   CALCIUM 8.7*  --  8.6* 8.9 8.8*  --   --   --   --  8.8* 8.8*  PHOS  --   --  4.6 7.7*  --   --   --   --   --   --   --   AST  --   --  346*  --  1,073* 553*  --   --   --   --   --   ALT  --   --  196*  --  1,006* 816*  --   --   --   --   --    < > = values in this interval not displayed.   Liver Function Tests: Recent Labs  Lab 09/17/20 0524  09/17/20 0524 09/18/20 2207 09/19/20 0559 09/20/20 0719  AST 346*  --   --  1,073* 553*  ALT 196*  --   --  1,006* 816*  ALKPHOS 107  --   --  132* 132*  BILITOT 0.7  --   --  0.7 0.9  PROT 7.2  --   --  7.1 7.2  ALBUMIN 2.7*   < > 2.7* 2.7* 2.8*   < > = values in  this interval not displayed.   No results for input(s): LIPASE, AMYLASE in the last 168 hours. No results for input(s): AMMONIA in the last 168 hours. CBC: Recent Labs  Lab 09/17/20 0524 09/17/20 0524 09/18/20 2207 09/18/20 2207 09/19/20 0559 09/20/20 1351 09/20/20 1355 09/20/20 1756 09/21/20 0504  WBC 7.3   < > 11.4*   < > 8.9  --   --  8.5 9.0  HGB 7.6*   < > 7.8*   < > 8.1*   < > 8.5* 8.1* 8.6*  HCT 23.6*   < > 23.9*   < > 24.9*   < > 25.0* 24.7* 26.3*  MCV 98.7  --  97.6  --  96.5  --   --  96.9 98.5  PLT 183   < > 136*   < > 123*  --   --  114* 108*   < > = values in this interval not displayed.   Cardiac Enzymes: No results for input(s): CKTOTAL, CKMB, CKMBINDEX, TROPONINI in the last 168 hours. CBG: Recent Labs  Lab 09/21/20 1009  GLUCAP 113*    Iron Studies:  No results for input(s): IRON, TIBC, TRANSFERRIN, FERRITIN in the last 72 hours. Studies/Results: CARDIAC CATHETERIZATION  Result Date: 09/20/2020  Prox LAD to Mid LAD lesion is 40% stenosed.  Mid Cx lesion is 50% stenosed.  Mid Cx to Dist Cx lesion is 90% stenosed.  Prox Cx lesion is 50% stenosed.  RV Branch lesion is 50% stenosed.  A drug-eluting stent was successfully placed using a STENT RESOLUTE ONYX 3.0X34.  Post intervention, there is a 0% residual stenosis.  Post intervention, there is a 0% residual stenosis.  Post intervention, there is a 0% residual stenosis.  A drug-eluting stent was successfully placed using a STENT RESOLUTE ONYX 4.0X18.  LV end diastolic pressure is normal.  Hemodynamic findings consistent with mild pulmonary hypertension.  1. Left dominant circulation 2. Severe single vessel obstructive CAD involving the  proximal to mid LCx 3. Normal LV filling pressures 4. Mild pulmonary HTN 5. Normal cardiac output 6. Successful PCI of the proximal to mid LCx with DES x 2 overlapping. Lesion modified with cutting balloon angioplasty and Shockwave therapy. Plan: DAPT for at least one year. Optimize medical therapy for CHF.   [name] allopurinol  100 mg Oral Daily  . apixaban  5 mg Oral BID  . aspirin EC  81 mg Oral QHS  . atorvastatin  40 mg Oral Daily  . Chlorhexidine Gluconate Cloth  6 each Topical Q0600  . clopidogrel  75 mg Oral Q breakfast  . hydrALAZINE  10 mg Oral Q8H  . levothyroxine  175 mcg Oral Q0600  . metoprolol succinate  25 mg Oral Daily  . pantoprazole  40 mg Oral Daily  . pramipexole  0.25 mg Oral QHS  . sodium chloride flush  3 mL Intravenous Q12H    BMET    Component Value Date/Time   NA 132 (L) 09/21/2020 0757   K 5.8 (H) 09/21/2020 0757   CL 91 (L) 09/21/2020 0757   CO2 23 09/21/2020 0757   GLUCOSE 112 (H) 09/21/2020 0757   BUN 78 (H) 09/21/2020 0757   CREATININE 8.31 (H) 09/21/2020 0757   CREATININE 3.69 (HH) 07/20/2020 1058   CALCIUM 8.8 (L) 09/21/2020 0757   GFRNONAA 6 (L) 09/21/2020 0757   GFRNONAA 15 (L) 07/20/2020 1058   GFRAA 18 (L) 07/20/2020 1058   CBC    Component Value Date/Time   WBC 9.0  09/21/2020 0504   RBC 2.67 (L) 09/21/2020 0504   HGB 8.6 (L) 09/21/2020 0504   HGB 9.3 (L) 07/20/2020 1058   HCT 26.3 (L) 09/21/2020 0504   HCT 17.5 (LL) 05/23/2019 0746   PLT 108 (L) 09/21/2020 0504   PLT 193 07/20/2020 1058   MCV 98.5 09/21/2020 0504   MCH 32.2 09/21/2020 0504   MCHC 32.7 09/21/2020 0504   RDW 15.9 (H) 09/21/2020 0504   LYMPHSABS 1.7 07/20/2020 1058   MONOABS 0.5 07/20/2020 1058   EOSABS 0.2 07/20/2020 1058   BASOSABS 0.1 07/20/2020 1058   Dialysis orders:  TTS [facility] 4 hr via AVG EDW 66 kg, 3K 2.5 Ca bath BFR 400 DFR A1.5 Heparin 3000 u bolus Calcitriol 0.5 mcg TIW  Assessment/Plan:  1. Acute on chronic CHF- EF decreased to 15-20%  and Cardiology following.  RHC vol up - UF with HD to push dry weight down as tolerated.  S/p PCI Tues but CHF out of proportion to CAD contribution so may have cardioversion Thurs.  2. ESRD- continue with TTS schedule. HD tomorrow. We can work around the [facility] schedule. 3. HTN/volume-  Was volume up on RHC on Mon - pushing EDW down as tolerated. Daily weights reviewed -- seem fairly inconsistent, will have pre/post wts with HD. 4. Anemia - no ESA due to malignancy 5. Abnormal LFT's- markedly elevated AST and ALT.  GI consulted - defer on extensive w/u, thought to likely have congestion related to CHF.  [place] without biliary obstruction. 6. Moderate protein malnutrition- cont with supplements 7. BMD- cont with home meds  Jannifer Hick MD [facility] Pager [phone]

## 2020-09-22 NOTE — H&P (View-Only) (Signed)
Progress Note  Patient Name: Jeremy Johnson Date of Encounter: 09/22/2020  [facility] Cardiologist: Carlyle Dolly, MD   Subjective   Feeling ok this morning. Just came back from thoracentesis.  Inpatient Medications    Scheduled Meds: . allopurinol  100 mg Oral Daily  . apixaban  5 mg Oral BID  . aspirin EC  81 mg Oral QHS  . atorvastatin  40 mg Oral Daily  . Chlorhexidine Gluconate Cloth  6 each Topical Q0600  . clopidogrel  75 mg Oral Q breakfast  . hydrALAZINE  10 mg Oral Q8H  . levothyroxine  175 mcg Oral Q0600  . metoprolol succinate  25 mg Oral Daily  . pantoprazole  40 mg Oral Daily  . pramipexole  0.25 mg Oral QHS  . sodium chloride flush  3 mL Intravenous Q12H   Continuous Infusions: . sodium chloride    . sodium chloride    . sodium chloride     PRN Meds: sodium chloride, sodium chloride, sodium chloride, albuterol, heparin, heparin, lidocaine (PF), lidocaine-prilocaine, pentafluoroprop-tetrafluoroeth, prochlorperazine, sodium chloride flush   Vital Signs    Vitals:   09/22/20 0354 09/22/20 0359 09/22/20 0455 09/22/20 0634  BP: 99/68 102/66  106/75  Pulse: (!) 119 (!) 120    Resp: (!) 28 (!) 23    Temp: 98.1 F (36.7 C) 98 F (36.7 C)    TempSrc: Oral Oral    SpO2: 94% 96%    Weight:   59.7 kg   Height:        Intake/Output Summary (Last 24 hours) at 09/22/2020 0727 Last data filed at 09/21/2020 1900 Gross per 24 hour  Intake 780 ml  Output 2000 ml  Net -1220 ml   Last 3 Weights 09/22/2020 09/21/2020 09/21/2020  Weight (lbs) 131 lb 11.2 oz 150 lb 9.2 oz 155 lb  Weight (kg) 59.739 kg 68.3 kg 70.308 kg      Telemetry    Atrial flutter with intermittent paroxysmal atrial fibrillation. Rates in the 100's to 120's. PVC/couplets also noted. - Personally Reviewed  ECG    No new ECG tracing today. - Personally Reviewed  Physical Exam   GEN: No acute distress.   Neck: No JVD Cardiac: Tachycardic with regular rhythm. No murmurs,  rubs, or gallops.  Respiratory: No increased work of breathing. Decreased breath sounds in bases.  GI: Soft, non-tender, non-distended  MS: No lower extremity edema. No deformity. Neuro:  Non-focal deficits. Psych: Normal affect. Responds appropriately.  Labs    High Sensitivity Troponin:   Recent Labs  Lab 09/16/20 1649 09/16/20 1857  TROPONINIHS 79* 78*      Chemistry Recent Labs  Lab 09/17/20 0524 09/17/20 0524 09/18/20 2207 09/18/20 2207 09/19/20 0559 09/19/20 0559 09/20/20 0719 09/20/20 1351 09/20/20 1355 09/20/20 1756 09/21/20 0504 09/21/20 0757  NA 134*   < > 131*   < > 134*  --   --    < > 133*  --  131* 132*  K 4.7   < > 5.4*   < > 4.6  --   --    < > 4.9  --  6.1* 5.8*  CL 95*   < > 93*   < > 94*  --   --   --   --   --  92* 91*  CO2 26   < > 21*   < > 27  --   --   --   --   --  21* 23  GLUCOSE 88   < > 127*   < > 94  --   --   --   --   --  114* 112*  BUN 30*   < > 69*   < > 29*  --   --   --   --   --  75* 78*  CREATININE 4.96*   < > 7.93*   < > 3.99*   < >  --   --   --  7.06* 7.75* 8.31*  CALCIUM 8.6*   < > 8.9   < > 8.8*  --   --   --   --   --  8.8* 8.8*  PROT 7.2  --   --   --  7.1  --  7.2  --   --   --   --   --   ALBUMIN 2.7*   < > 2.7*  --  2.7*  --  2.8*  --   --   --   --   --   AST 346*  --   --   --  1,073*  --  553*  --   --   --   --   --   ALT 196*  --   --   --  1,006*  --  816*  --   --   --   --   --   ALKPHOS 107  --   --   --  132*  --  132*  --   --   --   --   --   BILITOT 0.7  --   --   --  0.7  --  0.9  --   --   --   --   --   GFRNONAA 12*   < > 7*   < > 15*   < >  --   --   --  8* 7* 6*  ANIONGAP 13   < > 17*   < > 13  --   --   --   --   --  18* 18*   < > = values in this interval not displayed.     Hematology Recent Labs  Lab 09/19/20 0559 09/20/20 1351 09/20/20 1355 09/20/20 1756 09/21/20 0504  WBC 8.9  --   --  8.5 9.0  RBC 2.58*  --   --  2.55* 2.67*  HGB 8.1*   < > 8.5* 8.1* 8.6*  HCT 24.9*   < > 25.0* 24.7*  26.3*  MCV 96.5  --   --  96.9 98.5  MCH 31.4  --   --  31.8 32.2  MCHC 32.5  --   --  32.8 32.7  RDW 15.3  --   --  15.7* 15.9*  PLT 123*  --   --  114* 108*   < > = values in this interval not displayed.    BNP Recent Labs  Lab 09/16/20 1636  BNP >4,500.0*     DDimer No results for input(s): DDIMER in the last 168 hours.   Radiology    CARDIAC CATHETERIZATION  Result Date: 09/20/2020  Prox LAD to Mid LAD lesion is 40% stenosed.  Mid Cx lesion is 50% stenosed.  Mid Cx to Dist Cx lesion is 90% stenosed.  Prox Cx lesion is 50% stenosed.  RV Branch lesion is 50% stenosed.  A drug-eluting stent was successfully placed using a STENT RESOLUTE ONYX  3.3T51.  Post intervention, there is a 0% residual stenosis.  Post intervention, there is a 0% residual stenosis.  Post intervention, there is a 0% residual stenosis.  A drug-eluting stent was successfully placed using a STENT RESOLUTE ONYX 4.0X18.  LV end diastolic pressure is normal.  Hemodynamic findings consistent with mild pulmonary hypertension.  1. Left dominant circulation 2. Severe single vessel obstructive CAD involving the proximal to mid LCx 3. Normal LV filling pressures 4. Mild pulmonary HTN 5. Normal cardiac output 6. Successful PCI of the proximal to mid LCx with DES x 2 overlapping. Lesion modified with cutting balloon angioplasty and Shockwave therapy. Plan: DAPT for at least one year. Optimize medical therapy for CHF.    Cardiac Studies   Echocardiogram 09/17/2020: Impressions: 1. Left ventricular ejection fraction, by estimation, is 15-20%. The left  ventricle has severely decreased function. The left ventricle demonstrates  global hypokinesis. The left ventricular internal cavity size was mildly  dilated. Left ventricular  diastolic parameters are consistent with Grade III diastolic dysfunction  (restrictive). Elevated left atrial pressure.  2. Right ventricular systolic function is mildly reduced. The right    ventricular size is normal.  3. Left atrial size was severely dilated.  4. Right atrial size was severely dilated.  5. Large pleural effusion in the left lateral region.  6. The mitral valve is normal in structure. Mild mitral valve  regurgitation. No evidence of mitral stenosis.  7. The aortic valve is tricuspid. Aortic valve regurgitation is not  visualized. No aortic stenosis is present.  8. The inferior vena cava is dilated in size with >50% respiratory  variability, suggesting right atrial pressure of 8 mmHg.  _______________  Right/Left Heart Catheterization 09/20/2020:  Prox LAD to Mid LAD lesion is 40% stenosed.  Mid Cx lesion is 50% stenosed.  Mid Cx to Dist Cx lesion is 90% stenosed.  Prox Cx lesion is 50% stenosed.  RV Branch lesion is 50% stenosed.  A drug-eluting stent was successfully placed using a STENT RESOLUTE ONYX 3.0X34.  Post intervention, there is a 0% residual stenosis.  Post intervention, there is a 0% residual stenosis.  Post intervention, there is a 0% residual stenosis.  A drug-eluting stent was successfully placed using a STENT RESOLUTE ONYX 4.0X18.  LV end diastolic pressure is normal.  Hemodynamic findings consistent with mild pulmonary hypertension.  1. Left dominant circulation 2. Severe single vessel obstructive CAD involving the proximal to mid LCx 3. Normal LV filling pressures 4. Mild pulmonary HTN 5. Normal cardiac output 6. Successful PCI of the proximal to mid LCx with DES x 2 overlapping. Lesion modified with cutting balloon angioplasty and Shockwave therapy.   Plan: DAPT for at least one year. Optimize medical therapy for CHF.   Patient Profile     74 y.o. male with a history of CAD with prior stent to LCX in 1997, hypertension, hyperlipidemia, hypothyroidism, ESRD on dialysis T/Th/Sat, COPD, and lung cancer who Cardiology was asked to see for acute systolic CHF with EF of 76-16%. Patient was transferred to [facility]  on 09/20/2020 for right/left heart catheterization.  Assessment & Plan    New Onset Acute Systolic CHF - Patient presented with shortness of breath, elevated JVD, and elevated LFTs but no lower extremity edema. - BNP markedly elevated at >4,500.  - Echo showed LVEF of 15-20% with global hypokinesis, grade 3 diastolic dysfunction, severe biatrial enlargement, and mild MR. Large left pleural effusion also noted. - R/LHC showed severe single vessel CAD involving  LCX s/p DES x2. LVEDP and LV filling pressures normal with mild pulmonary hypertension. - Volume status being managed with dialysis. - Continue Toprol-XL 25mg  daily.  - Hydralazine 10mg  three times daily started yesterday and BP tolerating this. - Continue to monitor daily weights, strict I/O's, and renal function.  Left Pleural Effusion - S/p left thoracentesis today.  CAD - High-sensitivity troponin minimally elevated and flat at 79 >> 78. Not consistent with ACS. Likely demand ischemia in setting of CHF and underlying ESRD. - R/LHC showed severe single vessel CAD involving LCX s/p DES x2 with mild non-obstructive disease elsewhere.  - No angina.  - Now that patient is in atrial flutter/fibrillation, will need triple therapy with Aspirin, Plavix, and Eilquis for 1 month and then Aspirin can be discontinued. - Continue beta-blocker and statin.  Atrial Flutter/Fibrillation s/p RVR - Noted to be in 2:1 atrial flutter yesterday with intermitted atrial fibrillation as well. Adenosine was given and confirmed atrial flutter (possibly course atrial fibrillation). These EKGs were reviewed with Dr. Quentin Ore (EP) who agreed that it looks like atrial flutter. - Continue Toprol-XL 25mg  daily.  - Continue Eliquis 5mg  twice daily.  - Plan is for TEE/DCCV tomorrow (no room on board today). Patient does have dysphagia and had a recent EGD in 07/2020 which showed no achalasia or strictures but did have food in middle third of esophagus. We will  discuss with GI and make sure that they do not have any concerns with [place] proceeding with TEE.  Hypertension - BP soft at times but stable. - Continue Toprol-XL and Hydralazine as above.  Hyperlipidemia - Last LDL 77 in 04/2019. Goal<70 given CAD. - Home Crestor switched to Lipitor 40mg  daily yesterday. - Will repeat fasting lipid panel tomorrow.  Thoracic Aortic Aneurysm - CT showed stable focal dilatation of proximal transverse aorta measuring 4.6 cm. Unchanged from prior imaging.  - Continue to monitor as outpatient.   Otherwise, per primary team: - ESRD - Anemia - Elevated LFTs - Pulmonary nodules - Hypothyroidism - GERD - Abdominal pain/diarrhea  For questions or updates, please contact [place] Please consult [email or web address] for contact info under        Signed, Darreld Mclean, PA-C  09/22/2020, 7:27 AM

## 2020-09-22 NOTE — Progress Notes (Signed)
PROGRESS NOTE    Jeremy Johnson   FGH:[id number]  DOB: February 20, 1946  DOA: 09/16/2020 PCP: [name], MD   Brief Narrative:  Jeremy Johnson 74 year old gentleman with prior history of ESRD on dialysis, coronary artery disease, COPD, hypertension, hyperlipidemia, hypothyroidism, history of small cell lung cancer who is very hard of hearing presents to ED from his PCP's office for worsening shortness of breath and tachycardia. He had not missed any dialysis. In ED, HR in 100s intermittently tachypneic. CT chest unrvealing as to the cause.  Cardiology consulted and felt he had acute systolic CHF. BNP > 4500, + JVD and elevated LFTs though to be due to hepatic congestion.     Subjective: No complaints today.     Assessment & Plan:   Principal Problem:   Acute systolic CHF in setting of ESRD   CAD - discovered to have an EF of 15- 20% and grade 3 d CHF - being treated with HD to remove fluid -cardiac cath 11/8> PCI to prox mid left cirx - recommended ASA/ Plavix and Eliquis x 1 mo and then stop Aspirin - also on Imdur - cont Lipitor 40 mg daily (was on Crestor at home) - per palliative care discussions, patient wants full scope of treatments  Active Problems: Elevated LFTs - AST 1,073 and ALT 1.006 on 11/7 - possibly hepatic congestion- LFTs improving - GI was consulted on 11/7 as LFTs were going up> no further work up recommended    A- flutter- new diagnosis - TSH normal - given Adenosine - started on Metoprolol, Eliquis   - plan for cardioversion tomorrow    Acquired hypothyroidism - Synthroid     GERD -cont PPI    Small cell lung cancer (3/20) - "limited stage small cell" received chemotherapy and is now on observation with Dr Mallory Shirk  Gout -cont Allopurinol     Chronic obstructive pulmonary disease/emphysema - Albuterol PRN - no exacerbation    Restless leg syndrome - cont Pramipexole  AOCD - cont management per renal team  Pulmonary nodules - no  change from prior imaging  Hyperkalemia - on 11/9 K was 6.1- treated/ resolved      Time spent in minutes: 40 DVT prophylaxis: SCDs Start: 09/17/20 0049 apixaban (ELIQUIS) tablet 5 mg   Code Status: Full code Family Communication: wife at bedside Disposition Plan:  Status is: Inpatient  Remains inpatient appropriate because:Hemodynamically unstable   Dispo: The patient is from: Home              Anticipated d/c is to: Home              Anticipated d/c date is: 2 days              Patient currently is not medically stable to d/c.      Consultants:   Cardiology  Palliative care  GI Procedures:   2 d ECHO 1. Left ventricular ejection fraction, by estimation, is 15-20%. The left  ventricle has severely decreased function. The left ventricle demonstrates  global hypokinesis. The left ventricular internal cavity size was mildly  dilated. Left ventricular  diastolic parameters are consistent with Grade III diastolic dysfunction  (restrictive). Elevated left atrial pressure.  2. Right ventricular systolic function is mildly reduced. The right  ventricular size is normal.  3. Left atrial size was severely dilated.  4. Right atrial size was severely dilated.  5. Large pleural effusion in the left lateral region.  6. The mitral valve is normal  in structure. Mild mitral valve  regurgitation. No evidence of mitral stenosis.  7. The aortic valve is tricuspid. Aortic valve regurgitation is not  visualized. No aortic stenosis is present.  8. The inferior vena cava is dilated in size with >50% respiratory  variability, suggesting right atrial pressure of 8 mmHg.  Antimicrobials:  Anti-infectives (From admission, onward)   None       Objective: Vitals:   09/22/20 0455 09/22/20 0634 09/22/20 0900 09/22/20 1123  BP:  106/75 97/65 100/65  Pulse:   (!) 104 (!) 107  Resp:   19   Temp:   (!) 97.4 F (36.3 C)   TempSrc:   Oral   SpO2:   97%   Weight: 59.7 kg       Height:        Intake/Output Summary (Last 24 hours) at 09/22/2020 1249 Last data filed at 09/21/2020 1900 Gross per 24 hour  Intake 240 ml  Output 2000 ml  Net -1760 ml   Filed Weights   09/21/20 1415 09/21/20 1749 09/22/20 0455  Weight: 70.3 kg 68.3 kg 59.7 kg    Examination: General exam: Appears comfortable  HEENT: PERRLA, oral mucosa moist, no sclera icterus or thrush- very HOH Respiratory system: Clear to auscultation. Respiratory effort normal. Cardiovascular system: S1 & S2 heard, IIRR.   Gastrointestinal system: Abdomen soft, non-tender, nondistended. Normal bowel sounds. Central nervous system: Alert and oriented. No focal neurological deficits. Extremities: No cyanosis, clubbing or edema Skin: No rashes or ulcers Psychiatry:  Mood & affect appropriate.     Data Reviewed: I have personally reviewed following labs and imaging studies  CBC: Recent Labs  Lab 09/17/20 0524 09/17/20 0524 09/18/20 2207 09/18/20 2207 09/19/20 0559 09/20/20 1351 09/20/20 1355 09/20/20 1756 09/21/20 0504  WBC 7.3  --  11.4*  --  8.9  --   --  8.5 9.0  HGB 7.6*   < > 7.8*   < > 8.1* 8.5* 8.5* 8.1* 8.6*  HCT 23.6*   < > 23.9*   < > 24.9* 25.0* 25.0* 24.7* 26.3*  MCV 98.7  --  97.6  --  96.5  --   --  96.9 98.5  PLT 183  --  136*  --  123*  --   --  114* 108*   < > = values in this interval not displayed.   Basic Metabolic Panel: Recent Labs  Lab 09/17/20 0524 09/17/20 0524 09/18/20 2207 09/18/20 2207 09/19/20 0559 09/19/20 0559 09/20/20 1351 09/20/20 1355 09/20/20 1756 09/21/20 0504 09/21/20 0757 09/22/20 0826  NA 134*   < > 131*   < > 134*   < > 134* 133*  --  131* 132* 135  K 4.7   < > 5.4*   < > 4.6   < > 4.9 4.9  --  6.1* 5.8* 4.2  CL 95*   < > 93*  --  94*  --   --   --   --  92* 91* 96*  CO2 26   < > 21*  --  27  --   --   --   --  21* 23 26  GLUCOSE 88   < > 127*  --  94  --   --   --   --  114* 112* 103*  BUN 30*   < > 69*  --  29*  --   --   --   --  75*  78* 34*  CREATININE  4.96*   < > 7.93*   < > 3.99*  --   --   --  7.06* 7.75* 8.31* 5.37*  CALCIUM 8.6*   < > 8.9  --  8.8*  --   --   --   --  8.8* 8.8* 8.6*  MG 2.0  --   --   --   --   --   --   --   --   --   --   --   PHOS 4.6  --  7.7*  --   --   --   --   --   --   --   --   --    < > = values in this interval not displayed.   GFR: Estimated Creatinine Clearance: 10.3 mL/min (A) (by C-G formula based on SCr of 5.37 mg/dL (H)). Liver Function Tests: Recent Labs  Lab 09/17/20 0524 09/18/20 2207 09/19/20 0559 09/20/20 0719 09/22/20 0826  AST 346*  --  1,073* 553* 218*  ALT 196*  --  1,006* 816* 506*  ALKPHOS 107  --  132* 132* 117  BILITOT 0.7  --  0.7 0.9 1.2  PROT 7.2  --  7.1 7.2 6.9  ALBUMIN 2.7* 2.7* 2.7* 2.8* 2.6*   No results for input(s): LIPASE, AMYLASE in the last 168 hours. No results for input(s): AMMONIA in the last 168 hours. Coagulation Profile: Recent Labs  Lab 09/17/20 0524 09/20/20 0719  INR 1.3* 1.4*   Cardiac Enzymes: No results for input(s): CKTOTAL, CKMB, CKMBINDEX, TROPONINI in the last 168 hours. BNP (last 3 results) No results for input(s): PROBNP in the last 8760 hours. HbA1C: No results for input(s): HGBA1C in the last 72 hours. CBG: Recent Labs  Lab 09/21/20 1009  GLUCAP 113*   Lipid Profile: No results for input(s): CHOL, HDL, LDLCALC, TRIG, CHOLHDL, LDLDIRECT in the last 72 hours. Thyroid Function Tests: No results for input(s): TSH, T4TOTAL, FREET4, T3FREE, THYROIDAB in the last 72 hours. Anemia Panel: No results for input(s): VITAMINB12, FOLATE, FERRITIN, TIBC, IRON, RETICCTPCT in the last 72 hours. Urine analysis:    Component Value Date/Time   COLORURINE YELLOW 05/22/2019 1919   APPEARANCEUR CLEAR 05/22/2019 1919   LABSPEC 1.022 05/22/2019 1919   PHURINE 8.0 05/22/2019 1919   GLUCOSEU 150 (A) 05/22/2019 [place] (A) 05/22/2019 [place] NEGATIVE 05/22/2019 [place] NEGATIVE 05/22/2019 1919     PROTEINUR >=300 (A) 05/22/2019 1919   NITRITE NEGATIVE 05/22/2019 1919   LEUKOCYTESUR NEGATIVE 05/22/2019 1919   Sepsis Labs: @LABRCNTIP (procalcitonin:4,lacticidven:4) ) Recent Results (from the past 240 hour(s))  Respiratory Panel by RT PCR (Flu A&B, Covid) - Nasopharyngeal Swab     Status: None   Collection Time: 09/16/20  4:29 PM   Specimen: Nasopharyngeal Swab  Result Value Ref Range Status   SARS Coronavirus 2 by RT PCR NEGATIVE NEGATIVE Final    Comment: (NOTE) SARS-CoV-2 target nucleic acids are NOT DETECTED.  The SARS-CoV-2 RNA is generally detectable in upper respiratoy specimens during the acute phase of infection. The lowest concentration of SARS-CoV-2 viral copies this assay can detect is 131 copies/mL. A negative result does not preclude SARS-Cov-2 infection and should not be used as the sole basis for treatment or other patient management decisions. A negative result may occur with  improper specimen collection/handling, submission of specimen other than nasopharyngeal swab, presence of viral mutation(s) within the areas targeted by this assay, and inadequate number  of viral copies (<131 copies/mL). A negative result must be combined with clinical observations, patient history, and epidemiological information. The expected result is Negative.  Fact Sheet for Patients:  PinkCheek.be  Fact Sheet for Healthcare Providers:  GravelBags.it  This test is no t yet approved or cleared by the [place] FDA and  has been authorized for detection and/or diagnosis of SARS-CoV-2 by FDA under an Emergency Use Authorization (EUA). This EUA will remain  in effect (meaning this test can be used) for the duration of the COVID-19 declaration under Section 564(b)(1) of the Act, 21 U.S.C. section 360bbb-3(b)(1), unless the authorization is terminated or revoked sooner.     Influenza A by PCR NEGATIVE NEGATIVE Final    Influenza B by PCR NEGATIVE NEGATIVE Final    Comment: (NOTE) The Xpert Xpress SARS-CoV-2/FLU/RSV assay is intended as an aid in  the diagnosis of influenza from Nasopharyngeal swab specimens and  should not be used as a sole basis for treatment. Nasal washings and  aspirates are unacceptable for Xpert Xpress SARS-CoV-2/FLU/RSV  testing.  Fact Sheet for Patients: PinkCheek.be  Fact Sheet for Healthcare Providers: GravelBags.it  This test is not yet approved or cleared by the [place] FDA and  has been authorized for detection and/or diagnosis of SARS-CoV-2 by  FDA under an Emergency Use Authorization (EUA). This EUA will remain  in effect (meaning this test can be used) for the duration of the  Covid-19 declaration under Section 564(b)(1) of the Act, 21  U.S.C. section 360bbb-3(b)(1), unless the authorization is  terminated or revoked. Performed at [facility], [address], [place], [place] [address]   MRSA PCR Screening     Status: None   Collection Time: 09/17/20  8:41 PM   Specimen: Nasal Mucosa; Nasopharyngeal  Result Value Ref Range Status   MRSA by PCR NEGATIVE NEGATIVE Final    Comment:        The GeneXpert MRSA Assay (FDA approved for NASAL specimens only), is one component of a comprehensive MRSA colonization surveillance program. It is not intended to diagnose MRSA infection nor to guide or monitor treatment for MRSA infections. Performed at [facility], [address] [place], [place] [address]          Radiology Studies: DG Chest 1 View  Result Date: 09/22/2020 CLINICAL DATA:  Status post left-sided thoracentesis. EXAM: CHEST  1 VIEW COMPARISON:  Chest x-ray 09/16/2020 FINDINGS: The heart is mildly enlarged but stable. Stable tortuosity and calcification and ectasia of the thoracic aorta. Near complete resolution of the left pleural effusion. No pneumothorax is identified. Minimal left  basilar atelectasis. IMPRESSION: Near complete resolution of the left pleural effusion. No pneumothorax. Electronically Signed   By: Marijo Sanes M.D.   On: 09/22/2020 09:17   CARDIAC CATHETERIZATION  Result Date: 09/20/2020  Prox LAD to Mid LAD lesion is 40% stenosed.  Mid Cx lesion is 50% stenosed.  Mid Cx to Dist Cx lesion is 90% stenosed.  Prox Cx lesion is 50% stenosed.  RV Branch lesion is 50% stenosed.  A drug-eluting stent was successfully placed using a STENT RESOLUTE ONYX 3.0X34.  Post intervention, there is a 0% residual stenosis.  Post intervention, there is a 0% residual stenosis.  Post intervention, there is a 0% residual stenosis.  A drug-eluting stent was successfully placed using a STENT RESOLUTE ONYX 4.0X18.  LV end diastolic pressure is normal.  Hemodynamic findings consistent with mild pulmonary hypertension.  1. Left dominant circulation 2. Severe single vessel  obstructive CAD involving the proximal to mid LCx 3. Normal LV filling pressures 4. Mild pulmonary HTN 5. Normal cardiac output 6. Successful PCI of the proximal to mid LCx with DES x 2 overlapping. Lesion modified with cutting balloon angioplasty and Shockwave therapy. Plan: DAPT for at least one year. Optimize medical therapy for CHF.   IR THORACENTESIS ASP PLEURAL SPACE W/IMG GUIDE  Result Date: 09/22/2020 INDICATION: Patient with a history of small cell lung cancer and end-stage renal disease presents with left pleural effusion. Interventional radiology asked to perform a therapeutic thoracentesis. EXAM: ULTRASOUND GUIDED THORACENTESIS MEDICATIONS: 1% lidocaine 10 mL COMPLICATIONS: None immediate. PROCEDURE: An ultrasound guided thoracentesis was thoroughly discussed with the patient and questions answered. The benefits, risks, alternatives and complications were also discussed. The patient understands and wishes to proceed with the procedure. Written consent was obtained. Ultrasound was performed to localize and  mark an adequate pocket of fluid in the left chest. The area was then prepped and draped in the normal sterile fashion. 1% Lidocaine was used for local anesthesia. Under ultrasound guidance a 6 Fr Safe-T-Centesis catheter was introduced. Thoracentesis was performed. The catheter was removed and a dressing applied. FINDINGS: A total of approximately 650 mL of clear yellow fluid was removed. IMPRESSION: Successful ultrasound guided left thoracentesis yielding 650 mL of pleural fluid. Read by: Soyla Dryer, NP Electronically Signed   By: Aletta Edouard M.D.   On: 09/22/2020 09:54      Scheduled Meds: . allopurinol  100 mg Oral Daily  . apixaban  5 mg Oral BID  . aspirin EC  81 mg Oral QHS  . atorvastatin  40 mg Oral Daily  . Chlorhexidine Gluconate Cloth  6 each Topical Q0600  . clopidogrel  75 mg Oral Q breakfast  . hydrALAZINE  10 mg Oral Q8H  . isosorbide mononitrate  15 mg Oral Daily  . levothyroxine  175 mcg Oral Q0600  . lidocaine      . metoprolol succinate  25 mg Oral Daily  . pantoprazole  40 mg Oral Daily  . pramipexole  0.25 mg Oral QHS  . sodium chloride flush  3 mL Intravenous Q12H   Continuous Infusions: . sodium chloride    . sodium chloride    . sodium chloride       LOS: 6 days      Debbe Odea, MD [facility] Pager: [email or web address] 09/22/2020, 12:49 PM

## 2020-09-23 ENCOUNTER — Inpatient Hospital Stay (HOSPITAL_COMMUNITY): Payer: Medicare Other | Admitting: Certified Registered"

## 2020-09-23 ENCOUNTER — Inpatient Hospital Stay (HOSPITAL_COMMUNITY): Payer: Medicare Other

## 2020-09-23 ENCOUNTER — Other Ambulatory Visit: Payer: Self-pay

## 2020-09-23 ENCOUNTER — Encounter (HOSPITAL_COMMUNITY)
Admission: EM | Disposition: A | Discharge status: Home / Self Care | Payer: Self-pay | Source: Ambulatory Visit | Attending: Internal Medicine

## 2020-09-23 ENCOUNTER — Encounter (HOSPITAL_COMMUNITY): Payer: Self-pay | Admitting: Internal Medicine

## 2020-09-23 DIAGNOSIS — I480 Paroxysmal atrial fibrillation: Secondary | ICD-10-CM | POA: Diagnosis not present

## 2020-09-23 DIAGNOSIS — I340 Nonrheumatic mitral (valve) insufficiency: Secondary | ICD-10-CM

## 2020-09-23 DIAGNOSIS — I5021 Acute systolic (congestive) heart failure: Secondary | ICD-10-CM | POA: Diagnosis not present

## 2020-09-23 HISTORY — PX: TEE WITHOUT CARDIOVERSION: SHX5443

## 2020-09-23 HISTORY — PX: CARDIOVERSION: SHX1299

## 2020-09-23 HISTORY — PX: BUBBLE STUDY: SHX6837

## 2020-09-23 LAB — CBC
HCT: 24.8 % — ABNORMAL LOW (ref 39.0–52.0)
Hemoglobin: 8.1 g/dL — ABNORMAL LOW (ref 13.0–17.0)
MCH: 32.5 pg (ref 26.0–34.0)
MCHC: 32.7 g/dL (ref 30.0–36.0)
MCV: 99.6 fL (ref 80.0–100.0)
Platelets: 106 10*3/uL — ABNORMAL LOW (ref 150–400)
RBC: 2.49 MIL/uL — ABNORMAL LOW (ref 4.22–5.81)
RDW: 17 % — ABNORMAL HIGH (ref 11.5–15.5)
WBC: 8.1 10*3/uL (ref 4.0–10.5)
nRBC: 2.2 % — ABNORMAL HIGH (ref 0.0–0.2)

## 2020-09-23 LAB — LIPID PANEL
Cholesterol: 87 mg/dL (ref 0–200)
HDL: 31 mg/dL — ABNORMAL LOW (ref >40)
LDL Cholesterol: 41 mg/dL (ref 0–99)
Total CHOL/HDL Ratio: 2.8 RATIO
Triglycerides: 76 mg/dL (ref <150)
VLDL: 15 mg/dL (ref 0–40)

## 2020-09-23 SURGERY — CARDIOVERSION
Anesthesia: General

## 2020-09-23 MED ORDER — AMIODARONE HCL 200 MG PO TABS
400.0000 mg | ORAL_TABLET | Freq: Two times a day (BID) | ORAL | Status: DC
  Administered 2020-09-23 – 2020-09-25 (×4): 400 mg via ORAL
  Filled 2020-09-23 (×4): qty 2

## 2020-09-23 MED ORDER — PROPOFOL 500 MG/50ML IV EMUL
INTRAVENOUS | Status: DC | PRN
  Administered 2020-09-23: 75 ug/kg/min via INTRAVENOUS

## 2020-09-23 MED ORDER — KETAMINE HCL 10 MG/ML IJ SOLN
INTRAMUSCULAR | Status: DC | PRN
  Administered 2020-09-23: 15 mg via INTRAVENOUS

## 2020-09-23 MED ORDER — AMIODARONE HCL 200 MG PO TABS: 200.0000 mg | ORAL_TABLET | Freq: Every day | ORAL | Status: DC

## 2020-09-23 MED ORDER — ALBUMIN HUMAN 5 % IV SOLN: INTRAVENOUS | Status: DC | PRN

## 2020-09-23 MED ORDER — GLYCOPYRROLATE 0.2 MG/ML IJ SOLN
INTRAMUSCULAR | Status: DC | PRN
  Administered 2020-09-23 (×2): .1 mg via INTRAVENOUS

## 2020-09-23 MED ORDER — LIDOCAINE VISCOUS HCL 2 % MT SOLN
OROMUCOSAL | Status: DC | PRN
  Administered 2020-09-23: 10 mL via OROMUCOSAL

## 2020-09-23 MED ORDER — METOPROLOL SUCCINATE ER 25 MG PO TB24
25.0000 mg | ORAL_TABLET | Freq: Every day | ORAL | Status: DC
  Administered 2020-09-23 – 2020-09-25 (×3): 25 mg via ORAL
  Filled 2020-09-23 (×3): qty 1

## 2020-09-23 MED ORDER — KETAMINE HCL 50 MG/5ML IJ SOSY
PREFILLED_SYRINGE | INTRAMUSCULAR | Status: AC
  Filled 2020-09-23: qty 5

## 2020-09-23 MED ORDER — LIDOCAINE VISCOUS HCL 2 % MT SOLN
OROMUCOSAL | Status: AC
  Filled 2020-09-23: qty 15

## 2020-09-23 NOTE — CV Procedure (Signed)
Brief TEE Note  LVEF 15%.  Global hypokinesis. Moderate right ventricular systolic dysfunction. Moderate TR Trivial MR and PR. PFO noted with right to left shunting at rest. Severe atherosclerosis of the descending aorta.  Cannot rule out intramural hematoma, focal dissection, or penetrating ulcer.  Recommend CT-A to better assess.       Electrical Cardioversion Procedure Note WITTEN CERTAIN [id number] 21-May-1946  Procedure: Electrical Cardioversion Indications:  Atrial Fibrillation  Procedure Details Consent: Risks of procedure as well as the alternatives and risks of each were explained to the (patient/caregiver).  Consent for procedure obtained. Time Out: Verified patient identification, verified procedure, site/side was marked, verified correct patient position, special equipment/implants available, medications/allergies/relevent history reviewed, required imaging and test results available.  Performed  Patient placed on cardiac monitor, pulse oximetry, supplemental oxygen as necessary.  Sedation given: propofol Pacer pads placed anterior and posterior chest.  Cardioverted 1 time(s).  Cardioverted at 150J.  Evaluation Findings: Post procedure EKG shows: NSR Complications: None Patient did tolerate procedure well.   Skeet Latch, MD 09/23/2020, 9:17 AM

## 2020-09-23 NOTE — Progress Notes (Signed)
Patient in dialysis, RN to re enter IV team consult when patient returns

## 2020-09-23 NOTE — Anesthesia Preprocedure Evaluation (Signed)
Anesthesia Evaluation  Patient identified by MRN, date of birth, ID band Patient awake    Reviewed: Allergy & Precautions, NPO status , Patient's Chart, lab work & pertinent test results  Airway Mallampati: II  TM Distance: >3 FB     Dental   Pulmonary COPD, former smoker,    breath sounds clear to auscultation       Cardiovascular hypertension, + CAD, + Past MI and +CHF   Rhythm:Regular Rate:Normal     Neuro/Psych    GI/Hepatic Neg liver ROS, GERD  ,  Endo/Other  Hypothyroidism   Renal/GU ESRFRenal disease     Musculoskeletal   Abdominal   Peds  Hematology  (+) anemia ,   Anesthesia Other Findings   Reproductive/Obstetrics                             Anesthesia Physical Anesthesia Plan  ASA: III  Anesthesia Plan: General   Post-op Pain Management:    Induction: Intravenous  PONV Risk Score and Plan: 2 and Ondansetron  Airway Management Planned: Simple Face Mask  Additional Equipment:   Intra-op Plan:   Post-operative Plan:   Informed Consent: I have reviewed the patients History and Physical, chart, labs and discussed the procedure including the risks, benefits and alternatives for the proposed anesthesia with the patient or authorized representative who has indicated his/her understanding and acceptance.     Dental advisory given  Plan Discussed with: CRNA and Anesthesiologist  Anesthesia Plan Comments:         Anesthesia Quick Evaluation

## 2020-09-23 NOTE — Anesthesia Postprocedure Evaluation (Signed)
Anesthesia Post Note  Patient: Jeremy Johnson  Procedure(s) Performed: CARDIOVERSION (N/A ) TRANSESOPHAGEAL ECHOCARDIOGRAM (TEE) (N/A ) BUBBLE STUDY     Patient location during evaluation: PACU Anesthesia Type: General Level of consciousness: awake Pain management: pain level controlled Vital Signs Assessment: post-procedure vital signs reviewed and stable Respiratory status: spontaneous breathing Cardiovascular status: stable Postop Assessment: no apparent nausea or vomiting Anesthetic complications: no   No complications documented.  Last Vitals:  Vitals:   09/23/20 0928 09/23/20 0938  BP: (!) 87/55 94/61  Pulse: 90 90  Resp: 16 (!) 27  Temp:    SpO2: 100% 97%    Last Pain:  Vitals:   09/23/20 0938  TempSrc:   PainSc: 0-No pain                 Trenity Pha

## 2020-09-23 NOTE — Transfer of Care (Signed)
Immediate Anesthesia Transfer of Care Note  Patient: Jeremy Johnson  Procedure(s) Performed: CARDIOVERSION (N/A ) TRANSESOPHAGEAL ECHOCARDIOGRAM (TEE) (N/A ) BUBBLE STUDY  Patient Location: PACU  Anesthesia Type:MAC  Level of Consciousness: drowsy  Airway & Oxygen Therapy: Patient Spontanous Breathing and Patient connected to face mask oxygen  Post-op Assessment: Report given to RN and Post -op Vital signs reviewed and stable  Post vital signs: Reviewed and stable  Last Vitals:  Vitals Value Taken Time  BP 83/50 09/23/20 0919  Temp    Pulse 90 09/23/20 0922  Resp 15 09/23/20 0922  SpO2 100 % 09/23/20 0922  Vitals shown include unvalidated device data.  Last Pain:  Vitals:   09/23/20 0801  TempSrc: Oral  PainSc: 0-No pain         Complications: No complications documented.

## 2020-09-23 NOTE — Progress Notes (Signed)
Cardiology Progress Note  Patient ID: Jeremy Johnson MRN: [id number] DOB: 02-25-1946 Date of Encounter: 09/23/2020  Primary Cardiologist: Jeremy Dolly, MD  Subjective   Chief Complaint: None.  HPI: Successful TEE/cardioversion today.  Back in sinus rhythm.  Concerns for penetrating ulcer on TEE.  CTA ordered.  Starting back metoprolol and amiodarone.  ROS:  All other ROS reviewed and negative. Pertinent positives noted in the HPI.     Inpatient Medications  Scheduled Meds: . allopurinol  100 mg Oral Daily  . amiodarone  400 mg Oral BID   Followed by  . [START ON 09/30/2020] amiodarone  200 mg Oral Daily  . apixaban  5 mg Oral BID  . atorvastatin  40 mg Oral Daily  . Chlorhexidine Gluconate Cloth  6 each Topical Q0600  . clopidogrel  75 mg Oral Q breakfast  . levothyroxine  175 mcg Oral Q0600  . metoprolol succinate  25 mg Oral Daily  . pantoprazole  40 mg Oral Daily  . pramipexole  0.25 mg Oral QHS  . sodium chloride flush  3 mL Intravenous Q12H   Continuous Infusions: . sodium chloride    . sodium chloride    . sodium chloride     PRN Meds: sodium chloride, sodium chloride, sodium chloride, albuterol, heparin, heparin, lidocaine (PF), lidocaine-prilocaine, pentafluoroprop-tetrafluoroeth, prochlorperazine, sodium chloride flush   Vital Signs   Vitals:   09/23/20 0919 09/23/20 0928 09/23/20 0938 09/23/20 1004  BP: (!) 83/50 (!) 87/55 94/61 95/69   Pulse: 90 90 90 87  Resp: 15 16 (!) 27 19  Temp: 98 F (36.7 C)   (!) 97.3 F (36.3 C)  TempSrc: Axillary   Oral  SpO2: 100% 100% 97% 100%  Weight:      Height:        Intake/Output Summary (Last 24 hours) at 09/23/2020 1111 Last data filed at 09/23/2020 0920 Gross per 24 hour  Intake 250 ml  Output 0 ml  Net 250 ml   Last 3 Weights 09/23/2020 09/22/2020 09/21/2020  Weight (lbs) 132 lb 9.6 oz 131 lb 11.2 oz 150 lb 9.2 oz  Weight (kg) 60.147 kg 59.739 kg 68.3 kg      Telemetry  Overnight telemetry  shows normal sinus rhythm, which I personally reviewed.   ECG  The most recent ECG shows atrial flutter, which I personally reviewed.   Physical Exam   Vitals:   09/23/20 0919 09/23/20 0928 09/23/20 0938 09/23/20 1004  BP: (!) 83/50 (!) 87/55 94/61 95/69   Pulse: 90 90 90 87  Resp: 15 16 (!) 27 19  Temp: 98 F (36.7 C)   (!) 97.3 F (36.3 C)  TempSrc: Axillary   Oral  SpO2: 100% 100% 97% 100%  Weight:      Height:         Intake/Output Summary (Last 24 hours) at 09/23/2020 1111 Last data filed at 09/23/2020 0920 Gross per 24 hour  Intake 250 ml  Output 0 ml  Net 250 ml    Last 3 Weights 09/23/2020 09/22/2020 09/21/2020  Weight (lbs) 132 lb 9.6 oz 131 lb 11.2 oz 150 lb 9.2 oz  Weight (kg) 60.147 kg 59.739 kg 68.3 kg    Body mass index is 19.58 kg/m.  General: Well nourished, well developed, in no acute distress Head: Atraumatic, normal size  Eyes: PEERLA, EOMI  Neck: Supple, JVD 7 to 8 cm of water Endocrine: No thryomegaly Cardiac: Normal S1, S2; RRR; no murmurs, rubs, or gallops Lungs: Clear to auscultation bilaterally,  no wheezing, rhonchi or rales  Abd: Soft, nontender, no hepatomegaly  Ext: No edema, pulses 2+ Musculoskeletal: No deformities, BUE and BLE strength normal and equal Skin: Warm and dry, no rashes   Neuro: Alert and oriented to person, place, time, and situation, CNII-XII grossly intact, no focal deficits  Psych: Normal mood and affect   Labs  High Sensitivity Troponin:   Recent Labs  Lab 09/16/20 1649 09/16/20 1857  TROPONINIHS 79* 78*     Cardiac EnzymesNo results for input(s): TROPONINI in the last 168 hours. No results for input(s): TROPIPOC in the last 168 hours.  Chemistry Recent Labs  Lab  0000 09/19/20 0559 09/20/20 0719 09/20/20 1351 09/21/20 0504 09/21/20 0757 09/22/20 0826  NA  --  134*  --    < > 131* 132* 135  K  --  4.6  --    < > 6.1* 5.8* 4.2  CL   < > 94*  --   --  92* 91* 96*  CO2   < > 27  --   --  21* 23 26    GLUCOSE   < > 94  --   --  114* 112* 103*  BUN   < > 29*  --   --  75* 78* 34*  CREATININE  --  3.99*  --    < > 7.75* 8.31* 5.37*  CALCIUM   < > 8.8*  --   --  8.8* 8.8* 8.6*  PROT  --  7.1 7.2  --   --   --  6.9  ALBUMIN  --  2.7* 2.8*  --   --   --  2.6*  AST  --  1,073* 553*  --   --   --  218*  ALT  --  1,006* 816*  --   --   --  506*  ALKPHOS  --  132* 132*  --   --   --  117  BILITOT  --  0.7 0.9  --   --   --  1.2  GFRNONAA  --  15*  --    < > 7* 6* 11*  ANIONGAP   < > 13  --   --  18* 18* 13   < > = values in this interval not displayed.    Hematology Recent Labs  Lab 09/20/20 1756 09/21/20 0504 09/23/20 0340  WBC 8.5 9.0 8.1  RBC 2.55* 2.67* 2.49*  HGB 8.1* 8.6* 8.1*  HCT 24.7* 26.3* 24.8*  MCV 96.9 98.5 99.6  MCH 31.8 32.2 32.5  MCHC 32.8 32.7 32.7  RDW 15.7* 15.9* 17.0*  PLT 114* 108* 106*   BNP Recent Labs  Lab 09/16/20 1636  BNP >4,500.0*    DDimer No results for input(s): DDIMER in the last 168 hours.   Radiology  DG Chest 1 View  Result Date: 09/22/2020 CLINICAL DATA:  Status post left-sided thoracentesis. EXAM: CHEST  1 VIEW COMPARISON:  Chest x-ray 09/16/2020 FINDINGS: The heart is mildly enlarged but stable. Stable tortuosity and calcification and ectasia of the thoracic aorta. Near complete resolution of the left pleural effusion. No pneumothorax is identified. Minimal left basilar atelectasis. IMPRESSION: Near complete resolution of the left pleural effusion. No pneumothorax. Electronically Signed   By: Marijo Sanes M.D.   On: 09/22/2020 09:17   ECHO TEE  Result Date: 09/23/2020    TRANSESOPHOGEAL ECHO REPORT   Patient Name:   Jeremy Johnson Date of Exam: 09/23/2020 Medical Rec #:  [id number]       Height:       69.0 in Accession #:    [id number]      Weight:       132.6 lb Date of Birth:  [birth year]      BSA:          1.735 m Patient Age:    74 years        BP:           110/76 mmHg Patient Gender: M               HR:           140 bpm. Exam  Location:  Inpatient Procedure: Transesophageal Echo Indications:    atrial fibrillation  History:        Patient has prior history of Echocardiogram examinations, most                 recent 09/17/2020. CAD and Previous Myocardial Infarction, COPD;                 Risk Factors:Hypertension, Dyslipidemia and Former Smoker.  Sonographer:    Jannett Celestine RDCS (AE) Referring Phys: [id number] [place]: After discussion of the risks and benefits of a TEE, an informed consent was obtained from the patient. The transesophogeal probe was passed without difficulty through the esophogus of the patient. Local oropharyngeal anesthetic was provided with Benzocaine spray. Sedation performed by different physician. Image quality was excellent. The patient's vital signs; including heart rate, blood pressure, and oxygen saturation; remained stable throughout the procedure. The patient developed no complications during the procedure. A successful direct current cardioversion was performed at 150 joules with 1 attempt. IMPRESSIONS  1. Left ventricular ejection fraction, by estimation, is <20%. The left ventricle has severely decreased function. The left ventricle demonstrates global hypokinesis. The left ventricular internal cavity size was mildly dilated.  2. Right ventricular systolic function is severely reduced. The right ventricular size is normal.  3. Left atrial size was severely dilated. No left atrial/left atrial appendage thrombus was detected.  4. The mitral valve is normal in structure. Mild mitral valve regurgitation. No evidence of mitral stenosis.  5. Tricuspid valve regurgitation is moderate.  6. The aortic valve is tricuspid. Aortic valve regurgitation is trivial. No aortic stenosis is present.  7. There is severe atheroma in the descending aorta. There is flow from the aorta into a region that appears to be a penetrating ulcer. Cannot rule out focal dissection flap. Recommend CT-A of the chest and  abdomen to better assess. There is Severe (Grade IV) atheroma plaque involving the descending aorta.  8. The inferior vena cava is normal in size with greater than 50% respiratory variability, suggesting right atrial pressure of 3 mmHg.  9. Evidence of atrial level shunting detected by color flow Doppler. Agitated saline contrast bubble study was positive with shunting observed within 3-6 cardiac cycles suggestive of interatrial shunt. There is a moderately sized patent foramen ovale with predominantly right to left shunting across the atrial septum. Conclusion(s)/Recommendation(s): Normal biventricular function without evidence of hemodynamically significant valvular heart disease. FINDINGS  Left Ventricle: Left ventricular ejection fraction, by estimation, is <20%. The left ventricle has severely decreased function. The left ventricle demonstrates global hypokinesis. The left ventricular internal cavity size was mildly dilated. There is no  left ventricular hypertrophy. Right Ventricle: The right ventricular size is normal. No increase in right ventricular wall thickness. Right ventricular systolic function is severely reduced.  Left Atrium: Left atrial size was severely dilated. No left atrial/left atrial appendage thrombus was detected. Right Atrium: Right atrial size was normal in size. Pericardium: There is no evidence of pericardial effusion. Mitral Valve: The mitral valve is normal in structure. Mild mitral valve regurgitation. No evidence of mitral valve stenosis. Tricuspid Valve: The tricuspid valve is normal in structure. Tricuspid valve regurgitation is moderate . No evidence of tricuspid stenosis. Aortic Valve: The aortic valve is tricuspid. Aortic valve regurgitation is trivial. No aortic stenosis is present. Pulmonic Valve: The pulmonic valve was normal in structure. Pulmonic valve regurgitation is trivial. No evidence of pulmonic stenosis. Aorta: There is severe atheroma in the descending aorta. There  is flow from the aorta into a region that appears to be a penetrating ulcer. Cannot rule out focal dissection flap. Recommend CT-A of the chest and abdomen to better assess. The aortic root is normal in size and structure. There is severe (Grade IV) atheroma plaque involving the descending aorta. Venous: The inferior vena cava is normal in size with greater than 50% respiratory variability, suggesting right atrial pressure of 3 mmHg. IAS/Shunts: Evidence of atrial level shunting detected by color flow Doppler. Agitated saline contrast was given intravenously to evaluate for intracardiac shunting. Agitated saline contrast bubble study was positive with shunting observed within 3-6 cardiac cycles suggestive of interatrial shunt. A moderately sized patent foramen ovale is detected with predominantly right to left shunting across the atrial septum. There is no evidence of an atrial septal defect.  TRICUSPID VALVE TR Peak grad:   36.0 mmHg TR Vmax:        300.00 cm/s Skeet Latch MD Electronically signed by Skeet Latch MD Signature Date/Time: 09/23/2020/10:23:32 AM    Final    IR THORACENTESIS ASP PLEURAL SPACE W/IMG GUIDE  Result Date: 09/22/2020 INDICATION: Patient with a history of small cell lung cancer and end-stage renal disease presents with left pleural effusion. Interventional radiology asked to perform a therapeutic thoracentesis. EXAM: ULTRASOUND GUIDED THORACENTESIS MEDICATIONS: 1% lidocaine 10 mL COMPLICATIONS: None immediate. PROCEDURE: An ultrasound guided thoracentesis was thoroughly discussed with the patient and questions answered. The benefits, risks, alternatives and complications were also discussed. The patient understands and wishes to proceed with the procedure. Written consent was obtained. Ultrasound was performed to localize and mark an adequate pocket of fluid in the left chest. The area was then prepped and draped in the normal sterile fashion. 1% Lidocaine was used for local  anesthesia. Under ultrasound guidance a 6 Fr Safe-T-Centesis catheter was introduced. Thoracentesis was performed. The catheter was removed and a dressing applied. FINDINGS: A total of approximately 650 mL of clear yellow fluid was removed. IMPRESSION: Successful ultrasound guided left thoracentesis yielding 650 mL of pleural fluid. Read by: Soyla Dryer, NP Electronically Signed   By: Aletta Edouard M.D.   On: 09/22/2020 09:54    Cardiac Studies  LHC 09/20/2020  Prox LAD to Mid LAD lesion is 40% stenosed.  Mid Cx lesion is 50% stenosed.  Mid Cx to Dist Cx lesion is 90% stenosed.  Prox Cx lesion is 50% stenosed.  RV Branch lesion is 50% stenosed.  A drug-eluting stent was successfully placed using a STENT RESOLUTE ONYX 3.0X34.  Post intervention, there is a 0% residual stenosis.  Post intervention, there is a 0% residual stenosis.  Post intervention, there is a 0% residual stenosis.  A drug-eluting stent was successfully placed using a STENT RESOLUTE ONYX 4.0X18.  LV end diastolic pressure is normal.  Hemodynamic findings  consistent with mild pulmonary hypertension.   1. Left dominant circulation 2. Severe single vessel obstructive CAD involving the proximal to mid LCx 3. Normal LV filling pressures 4. Mild pulmonary HTN 5. Normal cardiac output 6. Successful PCI of the proximal to mid LCx with DES x 2 overlapping. Lesion modified with cutting balloon angioplasty and Shockwave therapy.   TTE 09/17/2020 1. Left ventricular ejection fraction, by estimation, is 15-20%. The left  ventricle has severely decreased function. The left ventricle demonstrates  global hypokinesis. The left ventricular internal cavity size was mildly  dilated. Left ventricular  diastolic parameters are consistent with Grade III diastolic dysfunction  (restrictive). Elevated left atrial pressure.  2. Right ventricular systolic function is mildly reduced. The right  ventricular size is normal.    3. Left atrial size was severely dilated.  4. Right atrial size was severely dilated.  5. Large pleural effusion in the left lateral region.  6. The mitral valve is normal in structure. Mild mitral valve  regurgitation. No evidence of mitral stenosis.  7. The aortic valve is tricuspid. Aortic valve regurgitation is not  visualized. No aortic stenosis is present.  8. The inferior vena cava is dilated in size with >50% respiratory  variability, suggesting right atrial pressure of 8 mmHg.   Patient Profile  74 y.o. male with a history of CAD with prior stent to LCX in 1997, hypertension, hyperlipidemia, hypothyroidism, ESRDon dialysis T/Th/Sat, COPD, and lung cancer who Cardiology was asked to see for acute systolic CHF with EF of 78-29%. Patient was transferred to [facility] on 09/20/2020 for right/left heart catheterization.  Assessment & Plan   1.  New onset systolic heart failure -Transfer from any pain.  EF severely reduced.  Found to have single-vessel CAD status post PCI to the left circumflex.  TSH normal.  No alcohol abuse reported.  Did receive chemotherapy in the past but this is not anthracycline-based. -Found to be in atrial flutter.  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 -Status post TEE/cardioversion today.  Back in sinus rhythm.  Start amiodarone 40 mg twice daily for 7 days and then 200 mg daily thereafter. -Poor candidate for EP procedures. -Add back metoprolol succinate 25 mg daily.  Volume control per dialysis. -We will hopefully add hydralazine and Imdur tomorrow.  BPs have been soft.  2.  Atrial flutter status post TEE/cardioversion -Back in sinus rhythm.  On Eliquis 5 mg twice daily. -Amiodarone for rhythm control given low EF.  3.  CAD status post PCI to circumflex -DAPT was recommended however due to dark stools we reduced him to Plavix and Eliquis.  He had an EGD and colonoscopy this year.  EGD showed some dysphagia but no evidence of stricture or  bleeding.  He was noted to have some polyps as well as some hemorrhoids.  Suspect this is just hemorrhoidal bleeding.  However we will continue Plavix and Eliquis for now instead of triple therapy. -He is on a beta-blocker. -On high intensity statin. -Most recent LDL cholesterol 41  4.  Penetrating aortic ulcer? -tee demonstrated possibly penetrating aortic ulcer in the descending aorta. -No symptoms of this. -We will obtain a CTA to exclude dissection.  For questions or updates, please contact [place] Please consult [email or web address] for contact info under   Time Spent with Patient: I have spent a total of 25 minutes with patient reviewing hospital notes, telemetry, EKGs, labs and examining the patient as well as establishing an assessment and plan that was discussed with the patient.  >  50% of time was spent in direct patient care.    Signed, Addison Naegeli. Audie Box, [place]  09/23/2020 11:11 AM

## 2020-09-23 NOTE — Progress Notes (Signed)
PROGRESS NOTE    Jeremy Johnson   NUU:[id number]  DOB: [birth year]  DOA: 09/16/2020 PCP: Jeremy Noble, MD   Brief Narrative:  Jeremy Johnson 74 year old gentleman with prior history of ESRD on dialysis, coronary artery disease, COPD, hypertension, hyperlipidemia, hypothyroidism, history of small cell lung cancer who is very hard of hearing presents to ED from his PCP's office for worsening shortness of breath and tachycardia. He had not missed any dialysis. In ED, HR in 100s intermittently tachypneic. CT chest unrvealing as to the cause.  Cardiology consulted and felt he had acute systolic CHF. BNP > 4500, + JVD and elevated LFTs though to be due to hepatic congestion.     Subjective: He has no complaints.     Assessment & Plan:   Principal Problem:   Acute systolic CHF in setting of ESRD   CAD - discovered to have an EF of 15- 20% and grade 3 d CHF - being treated with HD to remove fluid -cardiac cath 11/8> PCI to prox mid left cirx - recommended ASA/ Plavix and Eliquis x 1 mo and then stop Aspirin - also on Imdur - cont Lipitor 40 mg daily (was on Crestor at home) - per palliative care discussions, patient wants full scope of treatments  Active Problems: Elevated LFTs - AST 1,073 and ALT 1.006 on 11/7 - possibly hepatic congestion- LFTs improving - GI was consulted on 11/7 as LFTs were going up> no further work up recommended    A- flutter- new diagnosis - TSH normal - given Adenosine - Cont Amio (oral), Metoprolol, Eliquis  Per cardiology - underwent TEE cardioversion today- doing well when seen after this  Possible penetrating aortic ulcer/dissection - noted on TEE- no chest pain - f/u CTA ordered    Acquired hypothyroidism - Synthroid     GERD -cont PPI    Small cell lung cancer (3/20) - "limited stage small cell" received chemotherapy and is now on observation with Jeremy Johnson  Gout -cont Allopurinol     Chronic obstructive pulmonary  disease/emphysema - Albuterol PRN - no exacerbation    Restless leg syndrome - cont Pramipexole  AOCD - cont management per renal team  Pulmonary nodules - no change from prior imaging  Hyperkalemia - on 11/9 K was 6.1- treated/ resolved      Time spent in minutes: 40 DVT prophylaxis: SCDs Start: 09/17/20 0049 apixaban (ELIQUIS) tablet 5 mg   Code Status: Full code Family Communication: wife at bedside Disposition Plan:  Status is: Inpatient  Remains inpatient appropriate because:Hemodynamically unstable   Dispo: The patient is from: Home              Anticipated d/c is to: Home              Anticipated d/c date is: 2 days              Patient currently is not medically stable to d/c.   Consultants:   Cardiology  Palliative care  GI Procedures:   2 d ECHO 1. Left ventricular ejection fraction, by estimation, is 15-20%. The left  ventricle has severely decreased function. The left ventricle demonstrates  global hypokinesis. The left ventricular internal cavity size was mildly  dilated. Left ventricular  diastolic parameters are consistent with Grade III diastolic dysfunction  (restrictive). Elevated left atrial pressure.  2. Right ventricular systolic function is mildly reduced. The right  ventricular size is normal.  3. Left atrial size was severely dilated.  4. Right  atrial size was severely dilated.  5. Large pleural effusion in the left lateral region.  6. The mitral valve is normal in structure. Mild mitral valve  regurgitation. No evidence of mitral stenosis.  7. The aortic valve is tricuspid. Aortic valve regurgitation is not  visualized. No aortic stenosis is present.  8. The inferior vena cava is dilated in size with >50% respiratory  variability, suggesting right atrial pressure of 8 mmHg.  Antimicrobials:  Anti-infectives (From admission, onward)   None       Objective: Vitals:   09/23/20 1300 09/23/20 1330 09/23/20 1400  09/23/20 1430  BP: 102/60 106/78 99/64 96/60   Pulse: 86 86 85 80  Resp: 20 20 (!) 22 (!) 21  Temp:      TempSrc:      SpO2:      Weight:      Height:        Intake/Output Summary (Last 24 hours) at 09/23/2020 1610 Last data filed at 09/23/2020 0920 Gross per 24 hour  Intake 250 ml  Output 0 ml  Net 250 ml   Filed Weights   09/22/20 0455 09/23/20 0429 09/23/20 1225  Weight: 59.7 kg 60.1 kg 60.1 kg    Examination: General exam: Appears comfortable  HEENT: PERRLA, oral mucosa moist, no sclera icterus or thrush- HOH Respiratory system: Clear to auscultation. Respiratory effort normal. Cardiovascular system: S1 & S2 heard,  No murmurs RRR Gastrointestinal system: Abdomen soft, non-tender, nondistended. Normal bowel sounds   Central nervous system: Alert and oriented. No focal neurological deficits. Extremities: No cyanosis, clubbing or edema Skin: No rashes or ulcers Psychiatry:  Mood & affect appropriate.     Data Reviewed: I have personally reviewed following labs and imaging studies  CBC: Recent Labs  Lab 09/18/20 2207 09/18/20 2207 09/19/20 0559 09/19/20 0559 09/20/20 1351 09/20/20 1355 09/20/20 1756 09/21/20 0504 09/23/20 0340  WBC 11.4*  --  8.9  --   --   --  8.5 9.0 8.1  HGB 7.8*   < > 8.1*   < > 8.5* 8.5* 8.1* 8.6* 8.1*  HCT 23.9*   < > 24.9*   < > 25.0* 25.0* 24.7* 26.3* 24.8*  MCV 97.6  --  96.5  --   --   --  96.9 98.5 99.6  PLT 136*  --  123*  --   --   --  114* 108* 106*   < > = values in this interval not displayed.   Basic Metabolic Panel: Recent Labs  Lab 09/17/20 0524 09/17/20 0524 09/18/20 2207 09/18/20 2207 09/19/20 0559 09/19/20 0559 09/20/20 1351 09/20/20 1355 09/20/20 1756 09/21/20 0504 09/21/20 0757 09/22/20 0826  NA 134*   < > 131*   < > 134*   < > 134* 133*  --  131* 132* 135  K 4.7   < > 5.4*   < > 4.6   < > 4.9 4.9  --  6.1* 5.8* 4.2  CL 95*   < > 93*  --  94*  --   --   --   --  92* 91* 96*  CO2 26   < > 21*  --  27   --   --   --   --  21* 23 26  GLUCOSE 88   < > 127*  --  94  --   --   --   --  114* 112* 103*  BUN 30*   < > 69*  --  29*  --   --   --   --  75* 78* 34*  CREATININE 4.96*   < > 7.93*   < > 3.99*  --   --   --  7.06* 7.75* 8.31* 5.37*  CALCIUM 8.6*   < > 8.9  --  8.8*  --   --   --   --  8.8* 8.8* 8.6*  MG 2.0  --   --   --   --   --   --   --   --   --   --   --   PHOS 4.6  --  7.7*  --   --   --   --   --   --   --   --   --    < > = values in this interval not displayed.   GFR: Estimated Creatinine Clearance: 10.4 mL/min (A) (by C-G formula based on SCr of 5.37 mg/dL (H)). Liver Function Tests: Recent Labs  Lab 09/17/20 0524 09/18/20 2207 09/19/20 0559 09/20/20 0719 09/22/20 0826  AST 346*  --  1,073* 553* 218*  ALT 196*  --  1,006* 816* 506*  ALKPHOS 107  --  132* 132* 117  BILITOT 0.7  --  0.7 0.9 1.2  PROT 7.2  --  7.1 7.2 6.9  ALBUMIN 2.7* 2.7* 2.7* 2.8* 2.6*   No results for input(s): LIPASE, AMYLASE in the last 168 hours. No results for input(s): AMMONIA in the last 168 hours. Coagulation Profile: Recent Labs  Lab 09/17/20 0524 09/20/20 0719 09/22/20 2012  INR 1.3* 1.4* 2.0*   Cardiac Enzymes: No results for input(s): CKTOTAL, CKMB, CKMBINDEX, TROPONINI in the last 168 hours. BNP (last 3 results) No results for input(s): PROBNP in the last 8760 hours. HbA1C: No results for input(s): HGBA1C in the last 72 hours. CBG: Recent Labs  Lab 09/21/20 1009  GLUCAP 113*   Lipid Profile: Recent Labs    09/23/20 0340  CHOL 87  HDL 31*  LDLCALC 41  TRIG 76  CHOLHDL 2.8   Thyroid Function Tests: No results for input(s): TSH, T4TOTAL, FREET4, T3FREE, THYROIDAB in the last 72 hours. Anemia Panel: No results for input(s): VITAMINB12, FOLATE, FERRITIN, TIBC, IRON, RETICCTPCT in the last 72 hours. Urine analysis:    Component Value Date/Time   COLORURINE YELLOW 05/22/2019 1919   APPEARANCEUR CLEAR 05/22/2019 1919   LABSPEC 1.022 05/22/2019 1919   PHURINE 8.0  05/22/2019 1919   GLUCOSEU 150 (A) 05/22/2019 [place] (A) 05/22/2019 [place] NEGATIVE 05/22/2019 [place] NEGATIVE 05/22/2019 1919   PROTEINUR >=300 (A) 05/22/2019 1919   NITRITE NEGATIVE 05/22/2019 1919   LEUKOCYTESUR NEGATIVE 05/22/2019 1919   Sepsis Labs: @LABRCNTIP (procalcitonin:4,lacticidven:4) ) Recent Results (from the past 240 hour(s))  Respiratory Panel by RT PCR (Flu A&B, Covid) - Nasopharyngeal Swab     Status: None   Collection Time: 09/16/20  4:29 PM   Specimen: Nasopharyngeal Swab  Result Value Ref Range Status   SARS Coronavirus 2 by RT PCR NEGATIVE NEGATIVE Final    Comment: (NOTE) SARS-CoV-2 target nucleic acids are NOT DETECTED.  The SARS-CoV-2 RNA is generally detectable in upper respiratoy specimens during the acute phase of infection. The lowest concentration of SARS-CoV-2 viral copies this assay can detect is 131 copies/mL. A negative result does not preclude SARS-Cov-2 infection and should not be used as the sole basis for treatment or other patient management decisions. A negative  result may occur with  improper specimen collection/handling, submission of specimen other than nasopharyngeal swab, presence of viral mutation(s) within the areas targeted by this assay, and inadequate number of viral copies (<131 copies/mL). A negative result must be combined with clinical observations, patient history, and epidemiological information. The expected result is Negative.  Fact Sheet for Patients:  PinkCheek.be  Fact Sheet for Healthcare Providers:  GravelBags.it  This test is no t yet approved or cleared by the [place] FDA and  has been authorized for detection and/or diagnosis of SARS-CoV-2 by FDA under an Emergency Use Authorization (EUA). This EUA will remain  in effect (meaning this test can be used) for the duration of the COVID-19 declaration under Section  564(b)(1) of the Act, 21 U.S.C. section 360bbb-3(b)(1), unless the authorization is terminated or revoked sooner.     Influenza A by PCR NEGATIVE NEGATIVE Final   Influenza B by PCR NEGATIVE NEGATIVE Final    Comment: (NOTE) The Xpert Xpress SARS-CoV-2/FLU/RSV assay is intended as an aid in  the diagnosis of influenza from Nasopharyngeal swab specimens and  should not be used as a sole basis for treatment. Nasal washings and  aspirates are unacceptable for Xpert Xpress SARS-CoV-2/FLU/RSV  testing.  Fact Sheet for Patients: PinkCheek.be  Fact Sheet for Healthcare Providers: GravelBags.it  This test is not yet approved or cleared by the [place] FDA and  has been authorized for detection and/or diagnosis of SARS-CoV-2 by  FDA under an Emergency Use Authorization (EUA). This EUA will remain  in effect (meaning this test can be used) for the duration of the  Covid-19 declaration under Section 564(b)(1) of the Act, 21  U.S.C. section 360bbb-3(b)(1), unless the authorization is  terminated or revoked. Performed at [facility], [address], [place], [place] [address]   MRSA PCR Screening     Status: None   Collection Time: 09/17/20  8:41 PM   Specimen: Nasal Mucosa; Nasopharyngeal  Result Value Ref Range Status   MRSA by PCR NEGATIVE NEGATIVE Final    Comment:        The GeneXpert MRSA Assay (FDA approved for NASAL specimens only), is one component of a comprehensive MRSA colonization surveillance program. It is not intended to diagnose MRSA infection nor to guide or monitor treatment for MRSA infections. Performed at [facility], [address], [place], [place] [address]          Radiology Studies: DG Chest 1 View  Result Date: 09/22/2020 CLINICAL DATA:  Status post left-sided thoracentesis. EXAM: CHEST  1 VIEW COMPARISON:  Chest x-ray 09/16/2020 FINDINGS: The heart is mildly enlarged but stable.  Stable tortuosity and calcification and ectasia of the thoracic aorta. Near complete resolution of the left pleural effusion. No pneumothorax is identified. Minimal left basilar atelectasis. IMPRESSION: Near complete resolution of the left pleural effusion. No pneumothorax. Electronically Signed   By: Marijo Sanes M.D.   On: 09/22/2020 09:17   ECHO TEE  Result Date: 09/23/2020    TRANSESOPHOGEAL ECHO REPORT   Patient Name:   Jeremy Johnson Date of Exam: 09/23/2020 Medical Rec #:  [id number]       Height:       69.0 in Accession #:    [id number]      Weight:       132.6 lb Date of Birth:  1946/08/11      BSA:          1.735 m Patient Age:    59 years  BP:           110/76 mmHg Patient Gender: M               HR:           140 bpm. Exam Location:  Inpatient Procedure: Transesophageal Echo                                 MODIFIED REPORT:   This report was modified by Skeet Latch MD on 09/23/2020 due to changed                                 preset findings.  Indications:     atrial fibrillation  History:         Patient has prior history of Echocardiogram examinations, most                  recent 09/17/2020. CAD and Previous Myocardial Infarction, COPD;                  Risk Factors:Hypertension, Dyslipidemia and Former Smoker.  Sonographer:     Jannett Celestine RDCS (AE) Referring Phys:  [id number] Darreld Mclean Diagnosing Phys: Skeet Latch MD PROCEDURE: After discussion of the risks and benefits of a TEE, an informed consent was obtained from the patient. The transesophogeal probe was passed without difficulty through the esophogus of the patient. Local oropharyngeal anesthetic was provided with Benzocaine spray. Sedation performed by different physician. Image quality was excellent. The patient's vital signs; including heart rate, blood pressure, and oxygen saturation; remained stable throughout the procedure. The patient developed no complications during the procedure. A successful direct  current cardioversion was performed at 150 joules with 1 attempt. IMPRESSIONS  1. Left ventricular ejection fraction, by estimation, is <20%. The left ventricle has severely decreased function. The left ventricle demonstrates global hypokinesis. The left ventricular internal cavity size was mildly dilated.  2. Right ventricular systolic function is severely reduced. The right ventricular size is normal.  3. Left atrial size was severely dilated. No left atrial/left atrial appendage thrombus was detected.  4. The mitral valve is normal in structure. Mild mitral valve regurgitation. No evidence of mitral stenosis.  5. Tricuspid valve regurgitation is moderate.  6. The aortic valve is tricuspid. Aortic valve regurgitation is trivial. No aortic stenosis is present.  7. There is severe atheroma in the descending aorta. There is flow from the aorta into a region that appears to be a penetrating ulcer. Cannot rule out focal dissection flap. Recommend CT-A of the chest and abdomen to better assess. There is Severe (Grade IV) atheroma plaque involving the descending aorta.  8. Evidence of atrial level shunting detected by color flow Doppler. Agitated saline contrast bubble study was positive with shunting observed within 3-6 cardiac cycles suggestive of interatrial shunt. There is a moderately sized patent foramen ovale with predominantly right to left shunting across the atrial septum. FINDINGS  Left Ventricle: Left ventricular ejection fraction, by estimation, is <20%. The left ventricle has severely decreased function. The left ventricle demonstrates global hypokinesis. The left ventricular internal cavity size was mildly dilated. There is no  left ventricular hypertrophy. Right Ventricle: The right ventricular size is normal. No increase in right ventricular wall thickness. Right ventricular systolic function is severely reduced. Left Atrium: Left atrial size was severely dilated. No left atrial/left atrial appendage  thrombus was detected. Right Atrium: Right atrial size was normal in size. Pericardium: There is no evidence of pericardial effusion. Mitral Valve: The mitral valve is normal in structure. Mild mitral valve regurgitation. No evidence of mitral valve stenosis. Tricuspid Valve: The tricuspid valve is normal in structure. Tricuspid valve regurgitation is moderate . No evidence of tricuspid stenosis. Aortic Valve: The aortic valve is tricuspid. Aortic valve regurgitation is trivial. No aortic stenosis is present. Pulmonic Valve: The pulmonic valve was normal in structure. Pulmonic valve regurgitation is trivial. No evidence of pulmonic stenosis. Aorta: There is severe atheroma in the descending aorta. There is flow from the aorta into a region that appears to be a penetrating ulcer. Cannot rule out focal dissection flap. Recommend CT-A of the chest and abdomen to better assess. The aortic root is normal in size and structure. There is severe (Grade IV) atheroma plaque involving the descending aorta. IAS/Shunts: Evidence of atrial level shunting detected by color flow Doppler. Agitated saline contrast was given intravenously to evaluate for intracardiac shunting. Agitated saline contrast bubble study was positive with shunting observed within 3-6 cardiac cycles suggestive of interatrial shunt. A moderately sized patent foramen ovale is detected with predominantly right to left shunting across the atrial septum. There is no evidence of an atrial septal defect.  TRICUSPID VALVE TR Peak grad:   36.0 mmHg TR Vmax:        300.00 cm/s Skeet Latch MD Electronically signed by Skeet Latch MD Signature Date/Time: 09/23/2020/10:23:32 AM    Final (Updated)    IR THORACENTESIS ASP PLEURAL SPACE W/IMG GUIDE  Result Date: 09/22/2020 INDICATION: Patient with a history of small cell lung cancer and end-stage renal disease presents with left pleural effusion. Interventional radiology asked to perform a therapeutic  thoracentesis. EXAM: ULTRASOUND GUIDED THORACENTESIS MEDICATIONS: 1% lidocaine 10 mL COMPLICATIONS: None immediate. PROCEDURE: An ultrasound guided thoracentesis was thoroughly discussed with the patient and questions answered. The benefits, risks, alternatives and complications were also discussed. The patient understands and wishes to proceed with the procedure. Written consent was obtained. Ultrasound was performed to localize and mark an adequate pocket of fluid in the left chest. The area was then prepped and draped in the normal sterile fashion. 1% Lidocaine was used for local anesthesia. Under ultrasound guidance a 6 Fr Safe-T-Centesis catheter was introduced. Thoracentesis was performed. The catheter was removed and a dressing applied. FINDINGS: A total of approximately 650 mL of clear yellow fluid was removed. IMPRESSION: Successful ultrasound guided left thoracentesis yielding 650 mL of pleural fluid. Read by: Soyla Dryer, NP Electronically Signed   By: Aletta Edouard M.D.   On: 09/22/2020 09:54      Scheduled Meds: . allopurinol  100 mg Oral Daily  . amiodarone  400 mg Oral BID   Followed by  . [START ON 09/30/2020] amiodarone  200 mg Oral Daily  . apixaban  5 mg Oral BID  . atorvastatin  40 mg Oral Daily  . Chlorhexidine Gluconate Cloth  6 each Topical Q0600  . clopidogrel  75 mg Oral Q breakfast  . levothyroxine  175 mcg Oral Q0600  . metoprolol succinate  25 mg Oral Daily  . pantoprazole  40 mg Oral Daily  . pramipexole  0.25 mg Oral QHS  . sodium chloride flush  3 mL Intravenous Q12H   Continuous Infusions: . sodium chloride    . sodium chloride    . sodium chloride       LOS: 7 days  Debbe Odea, MD [facility] Pager: [email or web address] 09/23/2020, 4:10 PM

## 2020-09-23 NOTE — Progress Notes (Signed)
[facility] Progress Note   Subjective:  Underwent cardioversion/TEE this am Seen in room. Feels 'loopy' from anesthesia No cp, sob. Eating breakfast.  For dialysis today.    Objective Vitals:   09/23/20 0919 09/23/20 0928 09/23/20 0938 09/23/20 1004  BP: (!) 83/50 (!) 87/55 94/61 95/69   Pulse: 90 90 90 87  Resp: 15 16 (!) 27 19  Temp: 98 F (36.7 C)   (!) 97.3 F (36.3 C)  TempSrc: Axillary   Oral  SpO2: 100% 100% 97% 100%  Weight:      Height:         Additional Objective Labs: Basic Metabolic Panel: Recent Labs  Lab 09/17/20 0524 09/17/20 0524 09/18/20 2207 09/19/20 0559 09/21/20 0504 09/21/20 0757 09/22/20 0826  NA 134*   < > 131*   < > 131* 132* 135  K 4.7   < > 5.4*   < > 6.1* 5.8* 4.2  CL 95*   < > 93*   < > 92* 91* 96*  CO2 26   < > 21*   < > 21* 23 26  GLUCOSE 88   < > 127*   < > 114* 112* 103*  BUN 30*   < > 69*   < > 75* 78* 34*  CREATININE 4.96*   < > 7.93*   < > 7.75* 8.31* 5.37*  CALCIUM 8.6*   < > 8.9   < > 8.8* 8.8* 8.6*  PHOS 4.6  --  7.7*  --   --   --   --    < > = values in this interval not displayed.   CBC: Recent Labs  Lab 09/18/20 2207 09/18/20 2207 09/19/20 0559 09/20/20 1351 09/20/20 1756 09/21/20 0504 09/23/20 0340  WBC 11.4*   < > 8.9  --  8.5 9.0 8.1  HGB 7.8*   < > 8.1*   < > 8.1* 8.6* 8.1*  HCT 23.9*   < > 24.9*   < > 24.7* 26.3* 24.8*  MCV 97.6  --  96.5  --  96.9 98.5 99.6  PLT 136*   < > 123*  --  114* 108* 106*   < > = values in this interval not displayed.   Blood Culture    Component Value Date/Time   SDES BLOOD LEFT ARM 05/22/2019 1559   SDES BLOOD LEFT ARM 05/22/2019 1559   SPECREQUEST  05/22/2019 1559    BOTTLES DRAWN AEROBIC AND ANAEROBIC Blood Culture results may not be optimal due to an inadequate volume of blood received in culture bottles   SPECREQUEST  05/22/2019 1559    BOTTLES DRAWN AEROBIC AND ANAEROBIC Blood Culture adequate volume   CULT  05/22/2019 1559    NO GROWTH 5  DAYS Performed at [facility], [place] [address] [place], [place] [address]    CULT  05/22/2019 1559    NO GROWTH 5 DAYS Performed at [facility], [place] [address], [place], [place] [address]    REPTSTATUS 05/27/2019 FINAL 05/22/2019 1559   REPTSTATUS 05/27/2019 FINAL 05/22/2019 1559     Physical Exam General: Older man, nad  Heart: RRR S1 S2  Lungs: Clear bilaterally  Abdomen: soft non-tender  Extremities: No LE edema  Dialysis Access: LUE AVF +bruit   Medications: . sodium chloride    . sodium chloride    . sodium chloride     . allopurinol  100 mg Oral Daily  . amiodarone  400 mg Oral BID  Followed by  . [START ON 09/30/2020] amiodarone  200 mg Oral Daily  . apixaban  5 mg Oral BID  . atorvastatin  40 mg Oral Daily  . Chlorhexidine Gluconate Cloth  6 each Topical Q0600  . clopidogrel  75 mg Oral Q breakfast  . levothyroxine  175 mcg Oral Q0600  . metoprolol succinate  25 mg Oral Daily  . pantoprazole  40 mg Oral Daily  . pramipexole  0.25 mg Oral QHS  . sodium chloride flush  3 mL Intravenous Q12H    Dialysis orders:  TTS [facility] 4 hr via AVG EDW 66 kg, 3K 2.5 Ca bath BFR 400 DFR A1.5 Heparin 3000 u bolus Calcitriol 0.5 mcg TIW  Assessment/Plan:  1. Acute on chronic CHF- EF decreased to 15-20% and Cardiology following.  RHC vol up - UF with HD to push dry weight down as tolerated.  S/p PCI Tues but CHF out of proportion to CAD contribution. Found to be in AFlutter --underwent successful cardioversion 11/11. Concern for penetrating aortic ulcer on TEE - CTA per cardiology.  2. ESRD- continue with TTS schedule. HD today on schedule.  3. HTN/volume-  Was volume up on RHC on Mon - pushing EDW down as tolerated. Daily weights reviewed -- seem fairly inconsistent, will have pre/post wts with HD. 4. Anemia - no ESA due to malignancy 5. Abnormal LFT's- markedly elevated AST and ALT.  GI consulted - defer on extensive w/u, thought to likely have  congestion related to CHF.  [place] without biliary obstruction. 6. Moderate protein malnutrition- cont with supplements 7. BMD- cont with home meds  Lynnda Child PA-C [facility] 09/23/2020,11:17 AM

## 2020-09-23 NOTE — Interval H&P Note (Signed)
History and Physical Interval Note:  09/23/2020 8:41 AM  Jeremy Johnson  has presented today for surgery, with the diagnosis of AFIB.  The various methods of treatment have been discussed with the patient and family. After consideration of risks, benefits and other options for treatment, the patient has consented to  Procedure(s): CARDIOVERSION (N/A) TRANSESOPHAGEAL ECHOCARDIOGRAM (TEE) (N/A) as a surgical intervention.  The patient's history has been reviewed, patient examined, no change in status, stable for surgery.  I have reviewed the patient's chart and labs.  Questions were answered to the patient's satisfaction.     Skeet Latch, MD

## 2020-09-23 NOTE — Progress Notes (Signed)
  Echocardiogram 2D Echocardiogram has been performed.  Jeremy Johnson 09/23/2020, 9:23 AM

## 2020-09-24 ENCOUNTER — Inpatient Hospital Stay (HOSPITAL_COMMUNITY): Payer: Medicare Other

## 2020-09-24 DIAGNOSIS — I5021 Acute systolic (congestive) heart failure: Secondary | ICD-10-CM | POA: Diagnosis not present

## 2020-09-24 LAB — POCT I-STAT, CHEM 8
BUN: 53 mg/dL — ABNORMAL HIGH (ref 8–23)
Calcium, Ion: 1.08 mmol/L — ABNORMAL LOW (ref 1.15–1.40)
Chloride: 96 mmol/L — ABNORMAL LOW (ref 98–111)
Creatinine, Ser: 7.4 mg/dL — ABNORMAL HIGH (ref 0.61–1.24)
Glucose, Bld: 109 mg/dL — ABNORMAL HIGH (ref 70–99)
HCT: 29 % — ABNORMAL LOW (ref 39.0–52.0)
Hemoglobin: 9.9 g/dL — ABNORMAL LOW (ref 13.0–17.0)
Potassium: 4.1 mmol/L (ref 3.5–5.1)
Sodium: 136 mmol/L (ref 135–145)
TCO2: 26 mmol/L (ref 22–32)

## 2020-09-24 MED ORDER — HYDRALAZINE HCL 10 MG PO TABS
10.0000 mg | ORAL_TABLET | Freq: Three times a day (TID) | ORAL | Status: DC
  Administered 2020-09-25: 10 mg via ORAL
  Filled 2020-09-24 (×2): qty 1

## 2020-09-24 MED ORDER — APIXABAN 2.5 MG PO TABS
2.5000 mg | ORAL_TABLET | Freq: Two times a day (BID) | ORAL | Status: DC
  Administered 2020-09-24 – 2020-09-25 (×2): 2.5 mg via ORAL
  Filled 2020-09-24 (×2): qty 1

## 2020-09-24 MED ORDER — IOHEXOL 350 MG/ML SOLN
80.0000 mL | Freq: Once | INTRAVENOUS | Status: AC | PRN
  Administered 2020-09-24: 80 mL via INTRAVENOUS

## 2020-09-24 MED ORDER — ISOSORBIDE MONONITRATE ER 30 MG PO TB24
15.0000 mg | ORAL_TABLET | Freq: Every day | ORAL | Status: DC
  Administered 2020-09-24 – 2020-09-25 (×2): 15 mg via ORAL
  Filled 2020-09-24 (×2): qty 1

## 2020-09-24 NOTE — TOC Benefit Eligibility Note (Signed)
Transition of Care [facility]) Benefit Eligibility Note    Patient Details  Name: Jeremy Johnson MRN: [id number] Date of Birth: 1945-12-18   Medication/Dose: Arne Cleveland  2.5 MG BID  and  ELIQUIS  5 MG BID  Covered?: Yes  Tier: 3 Drug  Prescription Coverage [facility]erred [facility]: [facility]  Spoke with Person/Company/Phone Number:: JOMER    @  [employer] TD # [phone]  Co-Pay: $47.00 FOR EACH  PRESCRIPTION  Prior Approval: No  Deductible: Met       Memory Argue Phone Number: 09/24/2020, 1:06 PM

## 2020-09-24 NOTE — Progress Notes (Signed)
Cardiology Progress Note  Patient ID: Jeremy Johnson MRN: [id number] DOB: Oct 27, 1946 Date of Encounter: 09/24/2020  Primary Cardiologist: Carlyle Dolly, MD  Subjective   Chief Complaint: None.  HPI: CTA not performed.  Confusion about if he can receive contrast.  He can.  He will proceed with CTA today.  ROS:  All other ROS reviewed and negative. Pertinent positives noted in the HPI.     Inpatient Medications  Scheduled Meds: . allopurinol  100 mg Oral Daily  . amiodarone  400 mg Oral BID   Followed by  . [START ON 09/30/2020] amiodarone  200 mg Oral Daily  . apixaban  5 mg Oral BID  . atorvastatin  40 mg Oral Daily  . Chlorhexidine Gluconate Cloth  6 each Topical Q0600  . clopidogrel  75 mg Oral Q breakfast  . levothyroxine  175 mcg Oral Q0600  . metoprolol succinate  25 mg Oral Daily  . pantoprazole  40 mg Oral Daily  . pramipexole  0.25 mg Oral QHS  . sodium chloride flush  3 mL Intravenous Q12H   Continuous Infusions: . sodium chloride    . sodium chloride    . sodium chloride     PRN Meds: sodium chloride, sodium chloride, sodium chloride, albuterol, heparin, heparin, lidocaine (PF), lidocaine-prilocaine, pentafluoroprop-tetrafluoroeth, prochlorperazine, sodium chloride flush   Vital Signs   Vitals:   09/23/20 2132 09/24/20 0419 09/24/20 0758 09/24/20 0806  BP: 97/65 107/68  106/68  Pulse: 81 73 78 73  Resp: 20 (!) 22 (!) 24 (!) 23  Temp: 97.8 F (36.6 C) 97.6 F (36.4 C)    TempSrc: Oral Oral    SpO2: 96% 96%  98%  Weight:  58.5 kg    Height:        Intake/Output Summary (Last 24 hours) at 09/24/2020 1016 Last data filed at 09/24/2020 0124 Gross per 24 hour  Intake 280 ml  Output 2500 ml  Net -2220 ml   Last 3 Weights 09/24/2020 09/23/2020 09/23/2020  Weight (lbs) 128 lb 14.4 oz 127 lb 13.9 oz 132 lb 7.9 oz  Weight (kg) 58.469 kg 58 kg 60.1 kg      Telemetry  Overnight telemetry shows sinus rhythm heart rate in the 70s, which I  personally reviewed.   ECG  The most recent ECG shows normal sinus rhythm, heart rate 89, LVH by voltage, secondary repolarization abnormalities, which I personally reviewed.   Physical Exam   Vitals:   09/23/20 2132 09/24/20 0419 09/24/20 0758 09/24/20 0806  BP: 97/65 107/68  106/68  Pulse: 81 73 78 73  Resp: 20 (!) 22 (!) 24 (!) 23  Temp: 97.8 F (36.6 C) 97.6 F (36.4 C)    TempSrc: Oral Oral    SpO2: 96% 96%  98%  Weight:  58.5 kg    Height:         Intake/Output Summary (Last 24 hours) at 09/24/2020 1016 Last data filed at 09/24/2020 0124 Gross per 24 hour  Intake 280 ml  Output 2500 ml  Net -2220 ml    Last 3 Weights 09/24/2020 09/23/2020 09/23/2020  Weight (lbs) 128 lb 14.4 oz 127 lb 13.9 oz 132 lb 7.9 oz  Weight (kg) 58.469 kg 58 kg 60.1 kg    Body mass index is 19.04 kg/m.  General: Well nourished, well developed, in no acute distress Head: Atraumatic, normal size  Eyes: PEERLA, EOMI  Neck: Supple, no JVD Endocrine: No thryomegaly Cardiac: Normal S1, S2; RRR; no murmurs, rubs, or  gallops Lungs: Clear to auscultation bilaterally, no wheezing, rhonchi or rales  Abd: Soft, nontender, no hepatomegaly  Ext: No edema, pulses 2+ Musculoskeletal: No deformities, BUE and BLE strength normal and equal Skin: Warm and dry, no rashes   Neuro: Alert and oriented to person, place, time, and situation, CNII-XII grossly intact, no focal deficits  Psych: Normal mood and affect   Labs  High Sensitivity Troponin:   Recent Labs  Lab 09/16/20 1649 09/16/20 1857  TROPONINIHS 79* 78*     Cardiac EnzymesNo results for input(s): TROPONINI in the last 168 hours. No results for input(s): TROPIPOC in the last 168 hours.  Chemistry Recent Labs  Lab  0000 09/19/20 0559 09/20/20 0719 09/20/20 1351 09/21/20 0504 09/21/20 0504 09/21/20 0757 09/22/20 0826 09/23/20 0826  NA  --  134*  --    < > 131*   < > 132* 135 136  K  --  4.6  --    < > 6.1*   < > 5.8* 4.2 4.1  CL   < >  94*  --   --  92*   < > 91* 96* 96*  CO2   < > 27  --   --  21*  --  23 26  --   GLUCOSE   < > 94  --   --  114*   < > 112* 103* 109*  BUN   < > 29*  --   --  75*   < > 78* 34* 53*  CREATININE  --  3.99*  --    < > 7.75*   < > 8.31* 5.37* 7.40*  CALCIUM   < > 8.8*  --   --  8.8*  --  8.8* 8.6*  --   PROT  --  7.1 7.2  --   --   --   --  6.9  --   ALBUMIN  --  2.7* 2.8*  --   --   --   --  2.6*  --   AST  --  1,073* 553*  --   --   --   --  218*  --   ALT  --  1,006* 816*  --   --   --   --  506*  --   ALKPHOS  --  132* 132*  --   --   --   --  117  --   BILITOT  --  0.7 0.9  --   --   --   --  1.2  --   GFRNONAA  --  15*  --    < > 7*  --  6* 11*  --   ANIONGAP   < > 13  --   --  18*  --  18* 13  --    < > = values in this interval not displayed.    Hematology Recent Labs  Lab 09/20/20 1756 09/20/20 1756 09/21/20 0504 09/23/20 0340 09/23/20 0826  WBC 8.5  --  9.0 8.1  --   RBC 2.55*  --  2.67* 2.49*  --   HGB 8.1*   < > 8.6* 8.1* 9.9*  HCT 24.7*   < > 26.3* 24.8* 29.0*  MCV 96.9  --  98.5 99.6  --   MCH 31.8  --  32.2 32.5  --   MCHC 32.8  --  32.7 32.7  --   RDW 15.7*  --  15.9* 17.0*  --  PLT 114*  --  108* 106*  --    < > = values in this interval not displayed.   BNPNo results for input(s): BNP, PROBNP in the last 168 hours.  DDimer No results for input(s): DDIMER in the last 168 hours.   Radiology  ECHO TEE  Result Date: 09/23/2020    TRANSESOPHOGEAL ECHO REPORT   Patient Name:   [name] Date of Exam: 09/23/2020 Medical Rec #:  [id number]       Height:       69.0 in Accession #:    [id number]      Weight:       132.6 lb Date of Birth:  [birth year]      BSA:          1.735 m Patient Age:    74 years        BP:           110/76 mmHg Patient Gender: M               HR:           140 bpm. Exam Location:  Inpatient Procedure: Transesophageal Echo                                 MODIFIED REPORT:   This report was modified by Skeet Latch MD on 09/23/2020 due to  changed                                 preset findings.  Indications:     atrial fibrillation  History:         Patient has prior history of Echocardiogram examinations, most                  recent 09/17/2020. CAD and Previous Myocardial Infarction, COPD;                  Risk Factors:Hypertension, Dyslipidemia and Former Smoker.  Sonographer:     Jannett Celestine RDCS (AE) Referring Phys:  [id number] Darreld Mclean Diagnosing Phys: Skeet Latch MD PROCEDURE: After discussion of the risks and benefits of a TEE, an informed consent was obtained from the patient. The transesophogeal probe was passed without difficulty through the esophogus of the patient. Local oropharyngeal anesthetic was provided with Benzocaine spray. Sedation performed by different physician. Image quality was excellent. The patient's vital signs; including heart rate, blood pressure, and oxygen saturation; remained stable throughout the procedure. The patient developed no complications during the procedure. A successful direct current cardioversion was performed at 150 joules with 1 attempt. IMPRESSIONS  1. Left ventricular ejection fraction, by estimation, is <20%. The left ventricle has severely decreased function. The left ventricle demonstrates global hypokinesis. The left ventricular internal cavity size was mildly dilated.  2. Right ventricular systolic function is severely reduced. The right ventricular size is normal.  3. Left atrial size was severely dilated. No left atrial/left atrial appendage thrombus was detected.  4. The mitral valve is normal in structure. Mild mitral valve regurgitation. No evidence of mitral stenosis.  5. Tricuspid valve regurgitation is moderate.  6. The aortic valve is tricuspid. Aortic valve regurgitation is trivial. No aortic stenosis is present.  7. There is severe atheroma in the descending aorta. There is flow from the aorta into a region that appears to be a penetrating ulcer.  Cannot rule out focal  dissection flap. Recommend CT-A of the chest and abdomen to better assess. There is Severe (Grade IV) atheroma plaque involving the descending aorta.  8. Evidence of atrial level shunting detected by color flow Doppler. Agitated saline contrast bubble study was positive with shunting observed within 3-6 cardiac cycles suggestive of interatrial shunt. There is a moderately sized patent foramen ovale with predominantly right to left shunting across the atrial septum. FINDINGS  Left Ventricle: Left ventricular ejection fraction, by estimation, is <20%. The left ventricle has severely decreased function. The left ventricle demonstrates global hypokinesis. The left ventricular internal cavity size was mildly dilated. There is no  left ventricular hypertrophy. Right Ventricle: The right ventricular size is normal. No increase in right ventricular wall thickness. Right ventricular systolic function is severely reduced. Left Atrium: Left atrial size was severely dilated. No left atrial/left atrial appendage thrombus was detected. Right Atrium: Right atrial size was normal in size. Pericardium: There is no evidence of pericardial effusion. Mitral Valve: The mitral valve is normal in structure. Mild mitral valve regurgitation. No evidence of mitral valve stenosis. Tricuspid Valve: The tricuspid valve is normal in structure. Tricuspid valve regurgitation is moderate . No evidence of tricuspid stenosis. Aortic Valve: The aortic valve is tricuspid. Aortic valve regurgitation is trivial. No aortic stenosis is present. Pulmonic Valve: The pulmonic valve was normal in structure. Pulmonic valve regurgitation is trivial. No evidence of pulmonic stenosis. Aorta: There is severe atheroma in the descending aorta. There is flow from the aorta into a region that appears to be a penetrating ulcer. Cannot rule out focal dissection flap. Recommend CT-A of the chest and abdomen to better assess. The aortic root is normal in size and  structure. There is severe (Grade IV) atheroma plaque involving the descending aorta. IAS/Shunts: Evidence of atrial level shunting detected by color flow Doppler. Agitated saline contrast was given intravenously to evaluate for intracardiac shunting. Agitated saline contrast bubble study was positive with shunting observed within 3-6 cardiac cycles suggestive of interatrial shunt. A moderately sized patent foramen ovale is detected with predominantly right to left shunting across the atrial septum. There is no evidence of an atrial septal defect.  TRICUSPID VALVE TR Peak grad:   36.0 mmHg TR Vmax:        300.00 cm/s Skeet Latch MD Electronically signed by Skeet Latch MD Signature Date/Time: 09/23/2020/10:23:32 AM    Final (Updated)     Cardiac Studies  LHC 09/20/2020  Prox LAD to Mid LAD lesion is 40% stenosed.  Mid Cx lesion is 50% stenosed.  Mid Cx to Dist Cx lesion is 90% stenosed.  Prox Cx lesion is 50% stenosed.  RV Branch lesion is 50% stenosed.  A drug-eluting stent was successfully placed using a STENT RESOLUTE ONYX 3.0X34.  Post intervention, there is a 0% residual stenosis.  Post intervention, there is a 0% residual stenosis.  Post intervention, there is a 0% residual stenosis.  A drug-eluting stent was successfully placed using a STENT RESOLUTE ONYX 4.0X18.  LV end diastolic pressure is normal.  Hemodynamic findings consistent with mild pulmonary hypertension.   1. Left dominant circulation 2. Severe single vessel obstructive CAD involving the proximal to mid LCx 3. Normal LV filling pressures 4. Mild pulmonary HTN 5. Normal cardiac output 6. Successful PCI of the proximal to mid LCx with DES x 2 overlapping. Lesion modified with cutting balloon angioplasty and Shockwave therapy.   Plan: DAPT for at least one year. Optimize medical therapy for  CHF.   TTE 09/17/2020   1. Left ventricular ejection fraction, by estimation, is 15-20%. The left  ventricle  has severely decreased function. The left ventricle demonstrates  global hypokinesis. The left ventricular internal cavity size was mildly  dilated. Left ventricular  diastolic parameters are consistent with Grade III diastolic dysfunction  (restrictive). Elevated left atrial pressure.  2. Right ventricular systolic function is mildly reduced. The right  ventricular size is normal.  3. Left atrial size was severely dilated.  4. Right atrial size was severely dilated.  5. Large pleural effusion in the left lateral region.  6. The mitral valve is normal in structure. Mild mitral valve  regurgitation. No evidence of mitral stenosis.  7. The aortic valve is tricuspid. Aortic valve regurgitation is not  visualized. No aortic stenosis is present.  8. The inferior vena cava is dilated in size with >50% respiratory  variability, suggesting right atrial pressure of 8 mmHg.    Patient Profile  74 y.o.malewith ahistory of CAD with prior stent to LCX in 1997, hypertension, hyperlipidemia, hypothyroidism, ESRDon dialysis T/Th/Sat, COPD, and lung cancer who Cardiology was asked to see for acute systolic CHF with EF of 54-09%. Patient was transferred to [facility] on 09/20/2020 for right/left heart catheterization.  Assessment & Plan   1.  New onset systolic heart failure, EF 15-20% -Transferred from [facility] with new onset cardiomyopathy.  Underwent left heart catheterization and found to have single-vessel disease in the circumflex and is status post PCI. -TSH normal. -No alcohol abuse reported. -HIV screen negative in 2020 -He did receive chemotherapy but did not receive anthracyclines in the past -Was found to be in atrial fibrillation/flutter -Cardiomyopathy out of proportion to CAD.  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 -Status post TEE/cardioversion.  Back in sinus rhythm.  We will plan for rhythm control with amiodarone.  Plan for 400 mg twice daily for 7 days and  then 200 mg daily thereafter.  He is a poor candidate for EP procedures given ESRD. -He is on metoprolol succinate 25 mg daily. -I ordered hydralazine 10 mg 3 times daily and Imdur 15 mg daily.  He should go out on this. -Volume control managed by dialysis.  2.  Atrial flutter status post TEE/cardioversion -On amiodarone as above.  On Eliquis 5 mg twice daily.  3.  CAD status post PCI to the left circumflex -Initial plan was for triple therapy for 1 month.  Did have some bleeding.  Has known hemorrhoids as well as some diverticulosis. -We have switched him to Plavix and Eliquis.  He will continue this for 1 year.  After 1 year he can go back to aspirin. -He is on a beta-blocker. -He is on a high intensity statin.  4.  Penetrating aortic ulcer? -Concern for this on TEE.  CTA ordered.  We will follow-up the results of this.  For questions or updates, please contact [place] Please consult [email or web address] for contact info under   Time Spent with Patient: I have spent a total of 35 minutes with patient reviewing hospital notes, telemetry, EKGs, labs and examining the patient as well as establishing an assessment and plan that was discussed with the patient.  > 50% of time was spent in direct patient care.    Signed, Addison Naegeli. Audie Box, [place]  09/24/2020 10:16 AM

## 2020-09-24 NOTE — Progress Notes (Signed)
Patient's blood pressure 87/56, paged [facility] since patient has po amio and hydralazine due. Per MD, hold both medications. Hydralazine had been held earlier today. Upon assessment, patient states feeling fine, denies, dizziness, chest pain, and shortness of breath. Will continue to monitor patient.

## 2020-09-24 NOTE — Progress Notes (Signed)
Patient returned from CT stating he did not have the scan done.  States his nephrologist told him not to take contrast dye, so he refused the CT scan.  Explained to the patient that it is ok to have contrast since he is on dialysis, but patient was adamant that he was not going to.  Will pass on to day shift nurse and reattempt CT scan tomorrow.  Jodell Cipro

## 2020-09-24 NOTE — Progress Notes (Signed)
Cardiology Office Note    Date:  10/04/2020   ID:  Jeremy Johnson, Jeremy Johnson 10/07/1946, MRN [id number]  PCP:  [name], MD  Cardiologist: Carlyle Dolly, MD EPS: None  Chief Complaint  Patient presents with  . Hospitalization Follow-up    History of Present Illness:  Jeremy Johnson is a 75 y.o. male with a history of CAD with prior stent to LCX in 1997, hypertension, hyperlipidemia, hypothyroidism, ESRD on dialysis T/Th/Sat, COPD, and lung cancer who was admitted with acute systolic CHF with EF of 02-40% ? arrhythmia induced . He underwent L/RHC DES x2 Cfx, 09/20/20, on Plavix and Eliquis and not triple therapy because of black stools,  developed Atrial flutter and underwent TEE DCCV 09/23/20, concern for aortic ulcer and CTA 09/24/20: Thoracic aneurysm 4.2 cm at arch as well as a penetrating ulcer 6mm -reviewed by Dr. Roxan Hockey in the hospital and felt to be stable and to keep follow-up with Dr. Servando Snare December 9.  Patient comes in today straight from dialysis. BP low. Not needing O2 at home since thoracentesis. HR in 60-70 range, no chest pain, dyspnea, dizziness or swelling. Has had some blood in his urine. Just finished an antibiotic. No melena.  Has much more energy and is feeling better.  [place] for [place].  Has had trouble sleeping since hospitalization but is doing better with Benadryl.   Past Medical History:  Diagnosis Date  . Anemia   . Blood transfusion without reported diagnosis   . CAD (coronary artery disease)    STENT... MID CIRCUMFLEX...1997  . Chronic kidney disease    STAGE 3  . COPD (chronic obstructive pulmonary disease) ([place])   . Degenerative joint disease (DJD) of lumbar spine   . GERD (gastroesophageal reflux disease)   . Gout   . Hyperlipidemia   . Hypertension   . Hypothyroidism   . Incisional hernia    abdomen  . Leukocytosis    CHRONIC MILD  . Myocardial infarction ([place])    1997  . SCL CA dx'd 01/2019   Lung  cancer    Past Surgical History:  Procedure Laterality Date  . ABDOMINAL AORTIC ANEURYSM REPAIR  2006  . AV FISTULA PLACEMENT Left 04/25/2019   Procedure: ARTERIOVENOUS (AV) FISTULA CREATION LEFT ARM;  Surgeon: Angelia Mould, MD;  Location: [facility];  Service: Vascular;  Laterality: Left;  . AV FISTULA PLACEMENT Left 05/19/2019   Procedure: CONVERSION OF LEFT ARM ARTERIOVENOUS FISTULA TO GRAFT;  Surgeon: Angelia Mould, MD;  Location: [facility];  Service: Vascular;  Laterality: Left;  . BIOPSY  09/30/2018   Procedure: BIOPSY;  Surgeon: Danie Binder, MD;  [facility];  Service: Endoscopy;;  ascending colon  . BIOPSY  07/27/2020   Procedure: BIOPSY;  Surgeon: Eloise Harman, DO;  [facility];  Service: Endoscopy;;  gastric  . BUBBLE STUDY  09/23/2020   Procedure: BUBBLE STUDY;  Surgeon: Skeet Latch, MD;  Location: [facility];  Service: Cardiovascular;;  . CARDIOVERSION N/A 09/23/2020   Procedure: CARDIOVERSION;  Surgeon: Skeet Latch, MD;  Location: [facility];  Service: Cardiovascular;  Laterality: N/A;  . COLONOSCOPY  2008  . COLONOSCOPY N/A 09/30/2018   External and internal hemorrhoids, six polyps removed, one ascending colon polypoid lesion biopsied. Six simple adenomas and one benign polypoid lesion. Colonoscopy Nov 2022.   [name] COLONOSCOPY WITH PROPOFOL N/A 07/27/2020   non-bleeding internal hemorrhoids, sigmoid and descending colon diverticulosis, four 1-2 mm polyps  in ascending colon, one 5 mm polyp in transverse colon. 3 year surveillance. Tubular adenomas.   . CORONARY ANGIOPLASTY WITH STENT PLACEMENT  1997   MID CIRCUMFLEX  . CORONARY STENT INTERVENTION N/A 09/20/2020   Procedure: CORONARY STENT INTERVENTION;  Surgeon: Martinique, Peter M, MD;  Location: [facility];  Service: Cardiovascular;  Laterality: N/A;  . ESOPHAGOGASTRODUODENOSCOPY (EGD) WITH PROPOFOL N/A 07/27/2020   Food in middle third of esophagus, gastritis s/p biopsy,  nodular mucosa in lesser curvature of stomach s/p biopsy. Negative H.pylori.   [name] HIP ARTHROPLASTY Right 04/30/2020   Procedure: ARTHROPLASTY  HIP (HEMIARTHROPLASTY);  Surgeon: Altamese [place], MD;  Location: [facility];  Service: Orthopedics;  Laterality: Right;  . INTRAVASCULAR ULTRASOUND/IVUS N/A 09/20/2020   Procedure: Intravascular Ultrasound/IVUS;  Surgeon: Martinique, Peter M, MD;  Location: [facility];  Service: Cardiovascular;  Laterality: N/A;  . IR FLUORO GUIDE CV LINE RIGHT  04/22/2019  . IR FLUORO GUIDE CV LINE RIGHT  05/01/2020  . IR THORACENTESIS ASP PLEURAL SPACE W/IMG GUIDE  09/22/2020  . IR THROMBECTOMY AV FISTULA W/THROMBOLYSIS/PTA INC/SHUNT/IMG LEFT Left 05/03/2020  . IR US GUIDE VASC ACCESS LEFT  05/03/2020  . IR US GUIDE VASC ACCESS RIGHT  04/22/2019  . IR US GUIDE VASC ACCESS RIGHT  05/01/2020  . POLYPECTOMY  09/30/2018   Procedure: POLYPECTOMY;  Surgeon: Danie Binder, MD;  [facility];  Service: Endoscopy;;  colon  . POLYPECTOMY  07/27/2020   Procedure: POLYPECTOMY;  Surgeon: Eloise Harman, DO;  [facility];  Service: Endoscopy;;  . RIGHT/LEFT HEART CATH AND CORONARY ANGIOGRAPHY N/A 09/20/2020   Procedure: RIGHT/LEFT HEART CATH AND CORONARY ANGIOGRAPHY;  Surgeon: Martinique, Peter M, MD;  Location: [facility];  Service: Cardiovascular;  Laterality: N/A;  . TEE WITHOUT CARDIOVERSION N/A 09/23/2020   Procedure: TRANSESOPHAGEAL ECHOCARDIOGRAM (TEE);  Surgeon: Skeet Latch, MD;  Location: [facility];  Service: Cardiovascular;  Laterality: N/A;  . VIDEO BRONCHOSCOPY WITH ENDOBRONCHIAL NAVIGATION N/A 01/27/2019   Procedure: VIDEO BRONCHOSCOPY WITH ENDOBRONCHIAL NAVIGATION;  Surgeon: Grace Isaac, MD;  Location: [facility];  Service: Thoracic;  Laterality: N/A;  . VIDEO BRONCHOSCOPY WITH ENDOBRONCHIAL ULTRASOUND N/A 01/27/2019   Procedure: VIDEO BRONCHOSCOPY WITH ENDOBRONCHIAL ULTRASOUND;  Surgeon: Grace Isaac, MD;  Location: [facility];  Service:  Thoracic;  Laterality: N/A;    Current Medications: Current Meds  Medication Sig  . acetaminophen (TYLENOL) 325 MG tablet Take 2 tablets (650 mg total) by mouth every 6 (six) hours as needed for mild pain (or Fever >/= 101). (Patient taking differently: Take 1,300 mg by mouth every 6 (six) hours as needed for mild pain or headache (or Fever >/= 101). Arthritis strength)  . albuterol (VENTOLIN HFA) 108 (90 Base) MCG/ACT inhaler Inhale 2 puffs into the lungs every 6 (six) hours as needed for wheezing or shortness of breath.   . allopurinol (ZYLOPRIM) 100 MG tablet Take 1 tablet (100 mg total) by mouth daily. (Patient taking differently: Take 100 mg by mouth in the morning. )  . amiodarone (PACERONE) 200 MG tablet Take 2 tablets (400 mg total) by mouth 2 (two) times daily for 4 days, THEN 1 tablet (200 mg total) daily.  [name] apixaban (ELIQUIS) 2.5 MG TABS tablet Take 1 tablet (2.5 mg total) by mouth 2 (two) times daily.  [name] atorvastatin (LIPITOR) 40 MG tablet Take 1 tablet (40 mg total) by mouth daily.  . B Complex-C-Zn-Folic Acid (DIALYVITE 401-UUVO 15) 0.8 MG TABS Take 1 tablet  by mouth daily.  . clopidogrel (PLAVIX) 75 MG tablet Take 1 tablet (75 mg total) by mouth daily with breakfast.  . Darbepoetin Alfa (ARANESP) 100 MCG/0.5ML SOSY injection Inject 0.5 mLs (100 mcg total) into the vein every Thursday with hemodialysis.  [name] iron sucrose in sodium chloride 0.9 % 100 mL 50 mg.  . levothyroxine (SYNTHROID, LEVOTHROID) 175 MCG tablet Take 175 mcg by mouth daily before breakfast.   . lidocaine-prilocaine (EMLA) cream SMARTSIG:1 Topical Every Night  . Methoxy PEG-Epoetin Beta (MIRCERA IJ) Inject 30 mg into the vein every 28 (twenty-eight) days.   . metoprolol succinate (TOPROL-XL) 25 MG 24 hr tablet Take 1 tablet (25 mg total) by mouth daily.  . [DISCONTINUED] doxycycline (VIBRA-TABS) 100 MG tablet Take 100 mg by mouth 2 (two) times daily.  . [DISCONTINUED] isosorbide mononitrate (IMDUR) 30 MG 24 hr  tablet Take 0.5 tablets (15 mg total) by mouth daily.     Allergies:   Advair hfa [fluticasone-salmeterol] and Penicillins   Social History   Socioeconomic History  . Marital status: Married    Spouse name: Not on file  . Number of children: 2  . Years of education: Not on file  . Highest education level: Not on file  Occupational History    Comment: retired  Tobacco Use  . Smoking status: Former Smoker    Packs/day: 0.50    Years: 54.00    Pack years: 27.00    Quit date: 09/17/2017    Years since quitting: 3.0  . Smokeless tobacco: Never Used  Vaping Use  . Vaping Use: Never used  Substance and Sexual Activity  . Alcohol use: Not Currently    Comment: occasional  . Drug use: No  . Sexual activity: Not Currently  Other Topics Concern  . Not on file  Social History Narrative  . Not on file   Social Determinants of Health   Financial Resource Strain:   . Difficulty of Paying Living Expenses: Not on file  Food Insecurity:   . Worried About Charity fundraiser in the Last Year: Not on file  . Ran Out of Food in the Last Year: Not on file  Transportation Needs:   . Lack of Transportation (Medical): Not on file  . Lack of Transportation (Non-Medical): Not on file  Physical Activity:   . Days of Exercise per Week: Not on file  . Minutes of Exercise per Session: Not on file  Stress:   . Feeling of Stress : Not on file  Social Connections:   . Frequency of Communication with Friends and Family: Not on file  . Frequency of Social Gatherings with Friends and Family: Not on file  . Attends Religious Services: Not on file  . Active Member of Clubs or Organizations: Not on file  . Attends Archivist Meetings: Not on file  . Marital Status: Not on file     Family History:  The patient's family history includes Lung cancer (age of onset: 48) in his sister; Stroke in his brother.   ROS:   Please see the history of present illness.    ROS All other systems  reviewed and are negative.   PHYSICAL EXAM:   VS:  BP (!) 82/42 Comment: just came from dialysis  Pulse 70   Ht 5\' 9"  (1.753 m)   Wt 131 lb 12.8 oz (59.8 kg)   SpO2 100%   BMI 19.46 kg/m   Physical Exam  GEN: Thin, in no acute distress  Neck: no JVD, carotid bruits, or masses Cardiac:RRR; no murmurs, rubs, or gallops  Respiratory: Decreased breath sounds but clear clear to auscultation bilaterally, normal work of breathing GI: soft, nontender, nondistended, + BS Ext: without cyanosis, clubbing, or edema, Good distal pulses bilaterally Neuro:  Alert and Oriented x 3 Psych: euthymic mood, full affect  Wt Readings from Last 3 Encounters:  10/04/20 131 lb 12.8 oz (59.8 kg)  09/29/20 140 lb 3.2 oz (63.6 kg)  09/25/20 126 lb 12.2 oz (57.5 kg)      Studies/Labs Reviewed:   EKG:  EKG is not ordered today.   Recent Labs: 09/16/2020: B Natriuretic Peptide >4,500.0 09/17/2020: Magnesium 2.0; TSH 0.977 09/25/2020: ALT 224; BUN 36; Creatinine, Ser 6.44; Hemoglobin 8.3; Platelets 122; Potassium 3.6; Sodium 132   Lipid Panel    Component Value Date/Time   CHOL 87 09/23/2020 0340   TRIG 76 09/23/2020 0340   HDL 31 (L) 09/23/2020 0340   CHOLHDL 2.8 09/23/2020 0340   VLDL 15 09/23/2020 0340   LDLCALC 41 09/23/2020 0340    Additional studies/ records that were reviewed today include:   CTA 09/24/20 IMPRESSION: Thoracoabdominal aortic aneurysm. Maximal dimension of the thoracic aorta is 4.2 cm at the arch. Focal dilation at the diaphragmatic hiatus with maximal dimension of 3.9 cm. Maximal dimension of the abdominal aorta is 3.0 cm in its infrarenal segment. Recommend semi-annual imaging followup by CTA or MRA and referral to cardiothoracic surgery if not already obtained. This recommendation follows 2010 ACCF/AHA/AATS/ACR/ASA/SCA/SCAI/SIR/STS/SVM Guidelines for the Diagnosis and Management of Patients With Thoracic Aortic Disease. Circulation. 2010; 121: Q676-P95. Aortic  aneurysm NOS (ICD10-I71.9)    LHC 09/20/2020  Prox LAD to Mid LAD lesion is 40% stenosed.  Mid Cx lesion is 50% stenosed.  Mid Cx to Dist Cx lesion is 90% stenosed.  Prox Cx lesion is 50% stenosed.  RV Branch lesion is 50% stenosed.  A drug-eluting stent was successfully placed using a STENT RESOLUTE ONYX 3.0X34.  Post intervention, there is a 0% residual stenosis.  Post intervention, there is a 0% residual stenosis.  Post intervention, there is a 0% residual stenosis.  A drug-eluting stent was successfully placed using a STENT RESOLUTE ONYX 4.0X18.  LV end diastolic pressure is normal.  Hemodynamic findings consistent with mild pulmonary hypertension.   1. Left dominant circulation 2. Severe single vessel obstructive CAD involving the proximal to mid LCx 3. Normal LV filling pressures 4. Mild pulmonary HTN 5. Normal cardiac output 6. Successful PCI of the proximal to mid LCx with DES x 2 overlapping. Lesion modified with cutting balloon angioplasty and Shockwave therapy.    TTE 09/17/2020  1. Left ventricular ejection fraction, by estimation, is 15-20%. The left  ventricle has severely decreased function. The left ventricle demonstrates  global hypokinesis. The left ventricular internal cavity size was mildly  dilated. Left ventricular  diastolic parameters are consistent with Grade III diastolic dysfunction  (restrictive). Elevated left atrial pressure.   2. Right ventricular systolic function is mildly reduced. The right  ventricular size is normal.   3. Left atrial size was severely dilated.   4. Right atrial size was severely dilated.   5. Large pleural effusion in the left lateral region.   6. The mitral valve is normal in structure. Mild mitral valve  regurgitation. No evidence of mitral stenosis.   7. The aortic valve is tricuspid. Aortic valve regurgitation is not  visualized. No aortic stenosis is present.   8. The inferior vena cava is dilated  in size  with >50% respiratory  variability, suggesting right atrial pressure of 8 mmHg.      ASSESSMENT:    1. NICM (nonischemic cardiomyopathy) ([place])   2. Coronary artery disease involving native coronary artery of native heart without angina pectoris   3. Atrial flutter, unspecified type ([place])   4. Essential hypertension   5. Mixed hyperlipidemia   6. ESRD on dialysis ([place])   7. Small cell lung cancer ([facility] [place])   8. Thoracic aortic aneurysm without rupture ([facility])      PLAN:  In order of problems listed above:  New CM EF 15-20% ? arrhythmia induced.  Seems to be maintaining normal sinus rhythm.  Fluid managed with dialysis.  Has more energy.  Will repeat echo in 3 months and follow-up with Dr. Harl Bowie after that.  CAD S/P DESx2 prox-mid Cfx 09/20/20(prior stent Cfx 1997) on Plavix and Eliquis instead of triple therapy because of black stools.  No angina.  No further black stools.  Did have some hematuria but urinates very little and followed by renal.  Just finished an antibiotic. Will continue to monitor.  Atrial flutter S/P successful TEE guided DCCV 09/23/20 on metoprolol and amiodarone.  Heart rate has been 60-70 range at home and is regular on exam today.  Continue current treatment.  May have to decrease metoprolol because of low blood pressures but will continue to monitor.  HTN blood pressure runs quite low after dialysis.  We will stop Imdur.  May need to decrease metoprolol as well.  HLD LDL 41  ESRD on HD  Lung CA  9 mm penetrating aortic ulcer on CTA 09/24/20 also thoracic aneurysm 4.2 cm at arch.  Reviewed by Dr. Roxan Hockey in the hospital who felt it was stable and to keep follow-up with Dr. Pia Mau 10/21/2020.         Medication Adjustments/Labs and Tests Ordered: Current medicines are reviewed at length with the patient today.  Concerns regarding medicines are outlined above.  Medication changes, Labs and Tests ordered today are listed in the Patient Instructions  below. Patient Instructions  Medication Instructions:  STOP IMDUR    *If you need a refill on your cardiac medications before your next appointment, please call your pharmacy*   Lab Work: None today If you have labs (blood work) drawn today and your tests are completely normal, you will receive your results only by: [name] MyChart Message (if you have MyChart) OR . A paper copy in the mail If you have any lab test that is abnormal or we need to change your treatment, we will call you to review the results.   Testing/Procedures:Your physician has requested that you have an echocardiogram in 3 months. Echocardiography is a painless test that uses sound waves to create images of your heart. It provides your doctor with information about the size and shape of your heart and how well your heart's chambers and valves are working. This procedure takes approximately one hour. There are no restrictions for this procedure.    Follow-Up: At [facility], you and your health needs are our priority.  As part of our continuing mission to provide you with exceptional heart care, we have created designated Provider Care Teams.  These Care Teams include your primary Cardiologist (physician) and Advanced Practice Providers (APPs -  Physician Assistants and Nurse Practitioners) who all work together to provide you with the care you need, when you need it.  We recommend signing up for the patient portal called "MyChart".  Sign up information is provided on this After Visit Summary.  MyChart is used to connect with patients for Virtual Visits (Telemedicine).  Patients are able to view lab/test results, encounter notes, upcoming appointments, etc.  Non-urgent messages can be sent to your provider as well.   To learn more about what you can do with MyChart, go to [email or web address].    Your next appointment:   3 month(s)  The format for your next appointment:   In Person  Provider:   Carlyle Dolly,  MD   Other Instructions None      Thank you for choosing [facility] !            Sumner Boast, PA-C  10/04/2020 11:41 AM    [facility] [place], [place], [place]  [address] Phone: [phone]; Fax: [phone]

## 2020-09-24 NOTE — Progress Notes (Signed)
[facility] Progress Note   Subjective:  Declined CT with contrast yesterday as he recalled nephrology telling him in the past no contrast- we discussed and it's fine for him to take (being done for possible aortic ulcer/dissection seen on TEE yesterday; no CP).  UF 2.5 yesterday with HD. Wt this AM 58.5kg. Prior EDW [phone].    Objective Vitals:   09/23/20 2132 09/24/20 0419 09/24/20 0758 09/24/20 0806  BP: 97/65 107/68  106/68  Pulse: 81 73 78 73  Resp: 20 (!) 22 (!) 24 (!) 23  Temp: 97.8 F (36.6 C) 97.6 F (36.4 C)    TempSrc: Oral Oral    SpO2: 96% 96%  98%  Weight:  58.5 kg    Height:         Additional Objective Labs: Basic Metabolic Panel: Recent Labs  Lab 09/18/20 2207 09/19/20 0559 09/21/20 0504 09/21/20 0504 09/21/20 0757 09/22/20 0826 09/23/20 0826  NA 131*   < > 131*   < > 132* 135 136  K 5.4*   < > 6.1*   < > 5.8* 4.2 4.1  CL 93*   < > 92*   < > 91* 96* 96*  CO2 21*   < > 21*  --  23 26  --   GLUCOSE 127*   < > 114*   < > 112* 103* 109*  BUN 69*   < > 75*   < > 78* 34* 53*  CREATININE 7.93*   < > 7.75*   < > 8.31* 5.37* 7.40*  CALCIUM 8.9   < > 8.8*  --  8.8* 8.6*  --   PHOS 7.7*  --   --   --   --   --   --    < > = values in this interval not displayed.   CBC: Recent Labs  Lab 09/18/20 2207 09/18/20 2207 09/19/20 0559 09/20/20 1351 09/20/20 1756 09/20/20 1756 09/21/20 0504 09/23/20 0340 09/23/20 0826  WBC 11.4*   < > 8.9  --  8.5  --  9.0 8.1  --   HGB 7.8*   < > 8.1*   < > 8.1*   < > 8.6* 8.1* 9.9*  HCT 23.9*   < > 24.9*   < > 24.7*   < > 26.3* 24.8* 29.0*  MCV 97.6  --  96.5  --  96.9  --  98.5 99.6  --   PLT 136*   < > 123*  --  114*  --  108* 106*  --    < > = values in this interval not displayed.   Blood Culture    Component Value Date/Time   SDES BLOOD LEFT ARM 05/22/2019 1559   SDES BLOOD LEFT ARM 05/22/2019 1559   SPECREQUEST  05/22/2019 1559    BOTTLES DRAWN AEROBIC AND ANAEROBIC Blood Culture results may  not be optimal due to an inadequate volume of blood received in culture bottles   SPECREQUEST  05/22/2019 1559    BOTTLES DRAWN AEROBIC AND ANAEROBIC Blood Culture adequate volume   CULT  05/22/2019 1559    NO GROWTH 5 DAYS Performed at [facility], [place] [address], [place], [place] [address]    CULT  05/22/2019 1559    NO GROWTH 5 DAYS Performed at [facility], [place] [address], [place], [place] [address]    REPTSTATUS 05/27/2019 FINAL 05/22/2019 1559   REPTSTATUS 05/27/2019 FINAL 05/22/2019 1559     Physical Exam  General: Older man, nad eating full breakfast Heart: RRR S1 S2  Lungs: Clear bilaterally  Abdomen: soft non-tender  Extremities: No LE edema  Dialysis Access: LUE AVF +bruit   Medications: . sodium chloride    . sodium chloride    . sodium chloride     . allopurinol  100 mg Oral Daily  . amiodarone  400 mg Oral BID   Followed by  . [START ON 09/30/2020] amiodarone  200 mg Oral Daily  . apixaban  5 mg Oral BID  . atorvastatin  40 mg Oral Daily  . Chlorhexidine Gluconate Cloth  6 each Topical Q0600  . clopidogrel  75 mg Oral Q breakfast  . levothyroxine  175 mcg Oral Q0600  . metoprolol succinate  25 mg Oral Daily  . pantoprazole  40 mg Oral Daily  . pramipexole  0.25 mg Oral QHS  . sodium chloride flush  3 mL Intravenous Q12H    Dialysis orders:  TTS [facility] 4 hr via AVG EDW 66 kg, 3K 2.5 Ca bath BFR 400 DFR A1.5 Heparin 3000 u bolus Calcitriol 0.5 mcg TIW  Assessment/Plan:  1. Acute on chronic CHF- EF decreased to 15-20% and Cardiology following.  RHC vol up - UF with HD to push dry weight down as tolerated.  S/p PCI Tues but CHF out of proportion to CAD contribution. Found to be in AFlutter --underwent successful cardioversion 11/11. Concern for penetrating aortic ulcer on TEE - CTA per cardiology - I discussed no issue with the iodinated contrast as he's anuric now.  2. ESRD- continue with TTS schedule. HD tomorrow on schedule.   3. HTN/volume-  Was volume up on RHC on Mon - pushing EDW down as tolerated. Well below now. 4. Anemia - no ESA due to malignancy 5. Abnormal LFT's- markedly elevated AST and ALT.  GI consulted - defer on extensive w/u, thought to likely have congestion related to CHF.  [place] without biliary obstruction. 6. Moderate protein malnutrition- cont with supplements 7. BMD- cont with home meds  Jannifer Hick MD [facility] Pager [phone]

## 2020-09-24 NOTE — Progress Notes (Signed)
PROGRESS NOTE    SIAH STEELY   WEX:[id number]  DOB: [birth year]  DOA: 09/16/2020 PCP: [name], MD   Brief Narrative:  Jeremy Johnson 74 year old gentleman with prior history of ESRD on dialysis, coronary artery disease, COPD, hypertension, hyperlipidemia, hypothyroidism, history of small cell lung cancer who is very hard of hearing presents to ED from his PCP's office for worsening shortness of breath and tachycardia. He had not missed any dialysis. In ED, HR in 100s intermittently tachypneic. CT chest unrvealing as to the cause.  Cardiology consulted and felt he had acute systolic CHF. BNP > 4500, + JVD and elevated LFTs though to be due to hepatic congestion.     Subjective: He has no complaints. He is declining a CTA as his nephrologist told him once to never take a contrasted CT.   Assessment & Plan:   Principal Problem:   Acute systolic CHF in setting of ESRD   CAD - discovered to have an EF of 15- 20% and grade 3 d CHF - being treated with HD to remove fluid -cardiac cath 11/8> PCI to prox mid left cirx - recommended ASA/ Plavix and Eliquis x 1 mo and then stop Aspirin - also on Imdur - cont Lipitor 40 mg daily (was on Crestor at home) - per palliative care discussions, patient wants full scope of treatments  Active Problems:    A- flutter- new diagnosis - TSH normal - given Adenosine - Cont Amio (oral), Metoprolol, Eliquis  Per cardiology - underwent TEE cardioversion 11/11 - doing well when seen after this  Elevated LFTs - AST 1,073 and ALT 1.006 on 11/7 - possibly hepatic congestion- LFTs improving - GI was consulted on 11/7 as LFTs were going up> no further work up recommended  Possible penetrating aortic ulcer/dissection - noted on TEE- no chest pain - f/u CTA ordered- I asked nephrology (Dr Johnney Ou) to speak with him this AM and he was in agreement after the discussion but CTA still no performed.     Acquired hypothyroidism - Synthroid      GERD -cont PPI    Small cell lung cancer (3/20) - "limited stage small cell" received chemotherapy and is now on observation with Dr Mallory Shirk  Gout -cont Allopurinol     Chronic obstructive pulmonary disease/emphysema - Albuterol PRN - no exacerbation    Restless leg syndrome - cont Pramipexole  AOCD - cont management per renal team  Pulmonary nodules - no change from prior imaging  Hyperkalemia - on 11/9 K was 6.1- treated/ resolved      Time spent in minutes: 40 DVT prophylaxis: apixaban (ELIQUIS) tablet 2.5 mg Start: 09/24/20 2200 SCDs Start: 09/17/20 0049 apixaban (ELIQUIS) tablet 2.5 mg   Code Status: Full code Family Communication: wife at bedside Disposition Plan:  Status is: Inpatient  Remains inpatient appropriate because:Hemodynamically unstable   Dispo: The patient is from: Home              Anticipated d/c is to: Home              Anticipated d/c date is: 2 days              Patient currently is not medically stable to d/c.   Consultants:   Cardiology  Palliative care  GI Procedures:   2 d ECHO 1. Left ventricular ejection fraction, by estimation, is 15-20%. The left  ventricle has severely decreased function. The left ventricle demonstrates  global hypokinesis. The left ventricular internal cavity  size was mildly  dilated. Left ventricular  diastolic parameters are consistent with Grade III diastolic dysfunction  (restrictive). Elevated left atrial pressure.  2. Right ventricular systolic function is mildly reduced. The right  ventricular size is normal.  3. Left atrial size was severely dilated.  4. Right atrial size was severely dilated.  5. Large pleural effusion in the left lateral region.  6. The mitral valve is normal in structure. Mild mitral valve  regurgitation. No evidence of mitral stenosis.  7. The aortic valve is tricuspid. Aortic valve regurgitation is not  visualized. No aortic stenosis is present.  8. The  inferior vena cava is dilated in size with >50% respiratory  variability, suggesting right atrial pressure of 8 mmHg.  Antimicrobials:  Anti-infectives (From admission, onward)   None       Objective: Vitals:   09/24/20 0419 09/24/20 0758 09/24/20 0806 09/24/20 1432  BP: 107/68  106/68 (!) 91/56  Pulse: 73 78 73 69  Resp: (!) 22 (!) 24 (!) 23 15  Temp: 97.6 F (36.4 C)   (!) 97.5 F (36.4 C)  TempSrc: Oral   Oral  SpO2: 96%  98% 100%  Weight: 58.5 kg     Height:        Intake/Output Summary (Last 24 hours) at 09/24/2020 1907 Last data filed at 09/24/2020 1434 Gross per 24 hour  Intake 502 ml  Output --  Net 502 ml   Filed Weights   09/23/20 1225 09/23/20 1605 09/24/20 0419  Weight: 60.1 kg 58 kg 58.5 kg    Examination: General exam: Appears comfortable  HEENT: PERRLA, oral mucosa moist, no sclera icterus or thrush Respiratory system: Clear to auscultation. Respiratory effort normal. Cardiovascular system: S1 & S2 heard,  No murmurs  Gastrointestinal system: Abdomen soft, non-tender, nondistended. Normal bowel sounds   Central nervous system: Alert and oriented. No focal neurological deficits. Extremities: No cyanosis, clubbing or edema Skin: No rashes or ulcers Psychiatry:  Mood & affect appropriate.    Data Reviewed: I have personally reviewed following labs and imaging studies  CBC: Recent Labs  Lab 09/18/20 2207 09/18/20 2207 09/19/20 0559 09/20/20 1351 09/20/20 1355 09/20/20 1756 09/21/20 0504 09/23/20 0340 09/23/20 0826  WBC 11.4*  --  8.9  --   --  8.5 9.0 8.1  --   HGB 7.8*   < > 8.1*   < > 8.5* 8.1* 8.6* 8.1* 9.9*  HCT 23.9*   < > 24.9*   < > 25.0* 24.7* 26.3* 24.8* 29.0*  MCV 97.6  --  96.5  --   --  96.9 98.5 99.6  --   PLT 136*  --  123*  --   --  114* 108* 106*  --    < > = values in this interval not displayed.   Basic Metabolic Panel: Recent Labs  Lab 09/18/20 2207 09/18/20 2207 09/19/20 0559 09/19/20 0559 09/20/20 1351  09/20/20 1355 09/20/20 1756 09/21/20 0504 09/21/20 0757 09/22/20 0826 09/23/20 0826  NA 131*   < > 134*  --    < > 133*  --  131* 132* 135 136  K 5.4*   < > 4.6  --    < > 4.9  --  6.1* 5.8* 4.2 4.1  CL 93*   < > 94*  --   --   --   --  92* 91* 96* 96*  CO2 21*  --  27  --   --   --   --  21* 23 26  --   GLUCOSE 127*   < > 94  --   --   --   --  114* 112* 103* 109*  BUN 69*   < > 29*  --   --   --   --  75* 78* 34* 53*  CREATININE 7.93*   < > 3.99*   < >  --   --  7.06* 7.75* 8.31* 5.37* 7.40*  CALCIUM 8.9  --  8.8*  --   --   --   --  8.8* 8.8* 8.6*  --   PHOS 7.7*  --   --   --   --   --   --   --   --   --   --    < > = values in this interval not displayed.   GFR: Estimated Creatinine Clearance: 7.4 mL/min (A) (by C-G formula based on SCr of 7.4 mg/dL (H)). Liver Function Tests: Recent Labs  Lab 09/18/20 2207 09/19/20 0559 09/20/20 0719 09/22/20 0826  AST  --  1,073* 553* 218*  ALT  --  1,006* 816* 506*  ALKPHOS  --  132* 132* 117  BILITOT  --  0.7 0.9 1.2  PROT  --  7.1 7.2 6.9  ALBUMIN 2.7* 2.7* 2.8* 2.6*   No results for input(s): LIPASE, AMYLASE in the last 168 hours. No results for input(s): AMMONIA in the last 168 hours. Coagulation Profile: Recent Labs  Lab 09/20/20 0719 09/22/20 2012  INR 1.4* 2.0*   Cardiac Enzymes: No results for input(s): CKTOTAL, CKMB, CKMBINDEX, TROPONINI in the last 168 hours. BNP (last 3 results) No results for input(s): PROBNP in the last 8760 hours. HbA1C: No results for input(s): HGBA1C in the last 72 hours. CBG: Recent Labs  Lab 09/21/20 1009  GLUCAP 113*   Lipid Profile: Recent Labs    09/23/20 0340  CHOL 87  HDL 31*  LDLCALC 41  TRIG 76  CHOLHDL 2.8   Thyroid Function Tests: No results for input(s): TSH, T4TOTAL, FREET4, T3FREE, THYROIDAB in the last 72 hours. Anemia Panel: No results for input(s): VITAMINB12, FOLATE, FERRITIN, TIBC, IRON, RETICCTPCT in the last 72 hours. Urine analysis:    Component  Value Date/Time   COLORURINE YELLOW 05/22/2019 1919   APPEARANCEUR CLEAR 05/22/2019 1919   LABSPEC 1.022 05/22/2019 1919   PHURINE 8.0 05/22/2019 1919   GLUCOSEU 150 (A) 05/22/2019 [place] (A) 05/22/2019 [place] NEGATIVE 05/22/2019 [place] NEGATIVE 05/22/2019 1919   PROTEINUR >=300 (A) 05/22/2019 1919   NITRITE NEGATIVE 05/22/2019 1919   LEUKOCYTESUR NEGATIVE 05/22/2019 1919   Sepsis Labs: @LABRCNTIP (procalcitonin:4,lacticidven:4) ) Recent Results (from the past 240 hour(s))  Respiratory Panel by RT PCR (Flu A&B, Covid) - Nasopharyngeal Swab     Status: None   Collection Time: 09/16/20  4:29 PM   Specimen: Nasopharyngeal Swab  Result Value Ref Range Status   SARS Coronavirus 2 by RT PCR NEGATIVE NEGATIVE Final    Comment: (NOTE) SARS-CoV-2 target nucleic acids are NOT DETECTED.  The SARS-CoV-2 RNA is generally detectable in upper respiratoy specimens during the acute phase of infection. The lowest concentration of SARS-CoV-2 viral copies this assay can detect is 131 copies/mL. A negative result does not preclude SARS-Cov-2 infection and should not be used as the sole basis for treatment or other patient management decisions. A negative result may occur with  improper specimen collection/handling, submission of specimen other than nasopharyngeal swab,  presence of viral mutation(s) within the areas targeted by this assay, and inadequate number of viral copies (<131 copies/mL). A negative result must be combined with clinical observations, patient history, and epidemiological information. The expected result is Negative.  Fact Sheet for Patients:  PinkCheek.be  Fact Sheet for Healthcare Providers:  GravelBags.it  This test is no t yet approved or cleared by the [place] FDA and  has been authorized for detection and/or diagnosis of SARS-CoV-2 by FDA under an Emergency Use  Authorization (EUA). This EUA will remain  in effect (meaning this test can be used) for the duration of the COVID-19 declaration under Section 564(b)(1) of the Act, 21 U.S.C. section 360bbb-3(b)(1), unless the authorization is terminated or revoked sooner.     Influenza A by PCR NEGATIVE NEGATIVE Final   Influenza B by PCR NEGATIVE NEGATIVE Final    Comment: (NOTE) The Xpert Xpress SARS-CoV-2/FLU/RSV assay is intended as an aid in  the diagnosis of influenza from Nasopharyngeal swab specimens and  should not be used as a sole basis for treatment. Nasal washings and  aspirates are unacceptable for Xpert Xpress SARS-CoV-2/FLU/RSV  testing.  Fact Sheet for Patients: PinkCheek.be  Fact Sheet for Healthcare Providers: GravelBags.it  This test is not yet approved or cleared by the [place] FDA and  has been authorized for detection and/or diagnosis of SARS-CoV-2 by  FDA under an Emergency Use Authorization (EUA). This EUA will remain  in effect (meaning this test can be used) for the duration of the  Covid-19 declaration under Section 564(b)(1) of the Act, 21  U.S.C. section 360bbb-3(b)(1), unless the authorization is  terminated or revoked. Performed at [facility], [address], [place], [place] [address]   MRSA PCR Screening     Status: None   Collection Time: 09/17/20  8:41 PM   Specimen: Nasal Mucosa; Nasopharyngeal  Result Value Ref Range Status   MRSA by PCR NEGATIVE NEGATIVE Final    Comment:        The GeneXpert MRSA Assay (FDA approved for NASAL specimens only), is one component of a comprehensive MRSA colonization surveillance program. It is not intended to diagnose MRSA infection nor to guide or monitor treatment for MRSA infections. Performed at [facility], [address], [place], [place] [address]          Radiology Studies: ECHO TEE  Result Date: 09/23/2020    TRANSESOPHOGEAL ECHO  REPORT   Patient Name:   Jeremy Johnson Date of Exam: 09/23/2020 Medical Rec #:  [id number]       Height:       69.0 in Accession #:    [id number]      Weight:       132.6 lb Date of Birth:  1945/12/27      BSA:          1.735 m Patient Age:    36 years        BP:           110/76 mmHg Patient Gender: M               HR:           140 bpm. Exam Location:  Inpatient Procedure: Transesophageal Echo                                 MODIFIED REPORT:   This report was modified by Skeet Latch MD on  09/23/2020 due to changed                                 preset findings.  Indications:     atrial fibrillation  History:         Patient has prior history of Echocardiogram examinations, most                  recent 09/17/2020. CAD and Previous Myocardial Infarction, COPD;                  Risk Factors:Hypertension, Dyslipidemia and Former Smoker.  Sonographer:     Jannett Celestine RDCS (AE) Referring Phys:  [id number] Darreld Mclean Diagnosing Phys: Skeet Latch MD PROCEDURE: After discussion of the risks and benefits of a TEE, an informed consent was obtained from the patient. The transesophogeal probe was passed without difficulty through the esophogus of the patient. Local oropharyngeal anesthetic was provided with Benzocaine spray. Sedation performed by different physician. Image quality was excellent. The patient's vital signs; including heart rate, blood pressure, and oxygen saturation; remained stable throughout the procedure. The patient developed no complications during the procedure. A successful direct current cardioversion was performed at 150 joules with 1 attempt. IMPRESSIONS  1. Left ventricular ejection fraction, by estimation, is <20%. The left ventricle has severely decreased function. The left ventricle demonstrates global hypokinesis. The left ventricular internal cavity size was mildly dilated.  2. Right ventricular systolic function is severely reduced. The right ventricular size is normal.  3.  Left atrial size was severely dilated. No left atrial/left atrial appendage thrombus was detected.  4. The mitral valve is normal in structure. Mild mitral valve regurgitation. No evidence of mitral stenosis.  5. Tricuspid valve regurgitation is moderate.  6. The aortic valve is tricuspid. Aortic valve regurgitation is trivial. No aortic stenosis is present.  7. There is severe atheroma in the descending aorta. There is flow from the aorta into a region that appears to be a penetrating ulcer. Cannot rule out focal dissection flap. Recommend CT-A of the chest and abdomen to better assess. There is Severe (Grade IV) atheroma plaque involving the descending aorta.  8. Evidence of atrial level shunting detected by color flow Doppler. Agitated saline contrast bubble study was positive with shunting observed within 3-6 cardiac cycles suggestive of interatrial shunt. There is a moderately sized patent foramen ovale with predominantly right to left shunting across the atrial septum. FINDINGS  Left Ventricle: Left ventricular ejection fraction, by estimation, is <20%. The left ventricle has severely decreased function. The left ventricle demonstrates global hypokinesis. The left ventricular internal cavity size was mildly dilated. There is no  left ventricular hypertrophy. Right Ventricle: The right ventricular size is normal. No increase in right ventricular wall thickness. Right ventricular systolic function is severely reduced. Left Atrium: Left atrial size was severely dilated. No left atrial/left atrial appendage thrombus was detected. Right Atrium: Right atrial size was normal in size. Pericardium: There is no evidence of pericardial effusion. Mitral Valve: The mitral valve is normal in structure. Mild mitral valve regurgitation. No evidence of mitral valve stenosis. Tricuspid Valve: The tricuspid valve is normal in structure. Tricuspid valve regurgitation is moderate . No evidence of tricuspid stenosis. Aortic Valve:  The aortic valve is tricuspid. Aortic valve regurgitation is trivial. No aortic stenosis is present. Pulmonic Valve: The pulmonic valve was normal in structure. Pulmonic valve regurgitation is trivial. No evidence of pulmonic  stenosis. Aorta: There is severe atheroma in the descending aorta. There is flow from the aorta into a region that appears to be a penetrating ulcer. Cannot rule out focal dissection flap. Recommend CT-A of the chest and abdomen to better assess. The aortic root is normal in size and structure. There is severe (Grade IV) atheroma plaque involving the descending aorta. IAS/Shunts: Evidence of atrial level shunting detected by color flow Doppler. Agitated saline contrast was given intravenously to evaluate for intracardiac shunting. Agitated saline contrast bubble study was positive with shunting observed within 3-6 cardiac cycles suggestive of interatrial shunt. A moderately sized patent foramen ovale is detected with predominantly right to left shunting across the atrial septum. There is no evidence of an atrial septal defect.  TRICUSPID VALVE TR Peak grad:   36.0 mmHg TR Vmax:        300.00 cm/s Skeet Latch MD Electronically signed by Skeet Latch MD Signature Date/Time: 09/23/2020/10:23:32 AM    Final (Updated)       Scheduled Meds: . allopurinol  100 mg Oral Daily  . amiodarone  400 mg Oral BID   Followed by  . [START ON 09/30/2020] amiodarone  200 mg Oral Daily  . apixaban  2.5 mg Oral BID  . atorvastatin  40 mg Oral Daily  . Chlorhexidine Gluconate Cloth  6 each Topical Q0600  . clopidogrel  75 mg Oral Q breakfast  . hydrALAZINE  10 mg Oral Q8H  . isosorbide mononitrate  15 mg Oral Daily  . levothyroxine  175 mcg Oral Q0600  . metoprolol succinate  25 mg Oral Daily  . pantoprazole  40 mg Oral Daily  . pramipexole  0.25 mg Oral QHS  . sodium chloride flush  3 mL Intravenous Q12H   Continuous Infusions: . sodium chloride    . sodium chloride    . sodium  chloride       LOS: 8 days      Debbe Odea, MD [facility] Pager: [email or web address] 09/24/2020, 7:07 PM

## 2020-09-25 DIAGNOSIS — I5021 Acute systolic (congestive) heart failure: Secondary | ICD-10-CM | POA: Diagnosis not present

## 2020-09-25 DIAGNOSIS — Z992 Dependence on renal dialysis: Secondary | ICD-10-CM

## 2020-09-25 DIAGNOSIS — I4819 Other persistent atrial fibrillation: Secondary | ICD-10-CM

## 2020-09-25 DIAGNOSIS — N186 End stage renal disease: Secondary | ICD-10-CM | POA: Diagnosis not present

## 2020-09-25 DIAGNOSIS — I10 Essential (primary) hypertension: Secondary | ICD-10-CM | POA: Diagnosis not present

## 2020-09-25 LAB — CBC
HCT: 25.1 % — ABNORMAL LOW (ref 39.0–52.0)
Hemoglobin: 8.3 g/dL — ABNORMAL LOW (ref 13.0–17.0)
MCH: 32.9 pg (ref 26.0–34.0)
MCHC: 33.1 g/dL (ref 30.0–36.0)
MCV: 99.6 fL (ref 80.0–100.0)
Platelets: 122 10*3/uL — ABNORMAL LOW (ref 150–400)
RBC: 2.52 MIL/uL — ABNORMAL LOW (ref 4.22–5.81)
RDW: 18.4 % — ABNORMAL HIGH (ref 11.5–15.5)
WBC: 7 10*3/uL (ref 4.0–10.5)
nRBC: 0.3 % — ABNORMAL HIGH (ref 0.0–0.2)

## 2020-09-25 LAB — COMPREHENSIVE METABOLIC PANEL
ALT: 224 U/L — ABNORMAL HIGH (ref 0–44)
AST: 60 U/L — ABNORMAL HIGH (ref 15–41)
Albumin: 2.5 g/dL — ABNORMAL LOW (ref 3.5–5.0)
Alkaline Phosphatase: 110 U/L (ref 38–126)
Anion gap: 13 (ref 5–15)
BUN: 36 mg/dL — ABNORMAL HIGH (ref 8–23)
CO2: 24 mmol/L (ref 22–32)
Calcium: 7.9 mg/dL — ABNORMAL LOW (ref 8.9–10.3)
Chloride: 95 mmol/L — ABNORMAL LOW (ref 98–111)
Creatinine, Ser: 6.44 mg/dL — ABNORMAL HIGH (ref 0.61–1.24)
GFR, Estimated: 9 mL/min — ABNORMAL LOW (ref >60)
Glucose, Bld: 115 mg/dL — ABNORMAL HIGH (ref 70–99)
Potassium: 3.6 mmol/L (ref 3.5–5.1)
Sodium: 132 mmol/L — ABNORMAL LOW (ref 135–145)
Total Bilirubin: 1 mg/dL (ref 0.3–1.2)
Total Protein: 6.2 g/dL — ABNORMAL LOW (ref 6.5–8.1)

## 2020-09-25 MED ORDER — METOPROLOL SUCCINATE ER 25 MG PO TB24
25.0000 mg | ORAL_TABLET | Freq: Every day | ORAL | 1 refills | Status: DC
Start: 2020-09-25 — End: 2020-11-19

## 2020-09-25 MED ORDER — APIXABAN 2.5 MG PO TABS
2.5000 mg | ORAL_TABLET | Freq: Two times a day (BID) | ORAL | 1 refills | Status: DC
Start: 2020-09-25 — End: 2020-11-19

## 2020-09-25 MED ORDER — ISOSORBIDE MONONITRATE ER 30 MG PO TB24
15.0000 mg | ORAL_TABLET | Freq: Every day | ORAL | 1 refills | Status: DC
Start: 2020-09-25 — End: 2020-10-04

## 2020-09-25 MED ORDER — VITAMIN B 12 500 MCG PO TABS
1000.0000 ug | ORAL_TABLET | Freq: Every day | ORAL | Status: DC
Start: 2020-09-25 — End: 2020-09-29

## 2020-09-25 MED ORDER — CLOPIDOGREL BISULFATE 75 MG PO TABS
75.0000 mg | ORAL_TABLET | Freq: Every day | ORAL | 1 refills | Status: DC
Start: 2020-09-26 — End: 2020-11-19

## 2020-09-25 MED ORDER — ATORVASTATIN CALCIUM 40 MG PO TABS
40.0000 mg | ORAL_TABLET | Freq: Every day | ORAL | 1 refills | Status: DC
Start: 2020-09-26 — End: 2020-11-19

## 2020-09-25 MED ORDER — AMIODARONE HCL 200 MG PO TABS: ORAL_TABLET | ORAL | 0 refills | Status: DC

## 2020-09-25 MED ORDER — POTASSIUM CHLORIDE CRYS ER 20 MEQ PO TBCR
40.0000 meq | EXTENDED_RELEASE_TABLET | Freq: Once | ORAL | Status: AC
  Administered 2020-09-25: 40 meq via ORAL
  Filled 2020-09-25: qty 2

## 2020-09-25 MED ORDER — HEPARIN SODIUM (PORCINE) 1000 UNIT/ML DIALYSIS: 20.0000 [IU]/kg | INTRAMUSCULAR | Status: DC | PRN

## 2020-09-25 NOTE — Progress Notes (Signed)
BP back from HD = 100/61. Pt is asymptomatic. Discussed holding metoprolol and imdur with Richardson Dopp, PA. BP meds okay to give as long as pt remains asymptomatic.

## 2020-09-25 NOTE — Progress Notes (Signed)
D/C instructions clarified with Dr Ree Kida. Pt should take Amiodarone 400 mg tonight at home, and start Amiodarone 400 mg BID X 4 days beginning 09/26/20. Pt's wife, Naszir Cott, notified via telephone of clarified instructions. Verbalized understanding. No questions or concerns expressed.

## 2020-09-25 NOTE — Progress Notes (Signed)
[facility] Progress Note   Subjective: Says feeling the best he has is some time.  2 wts documented this AM - neither was a standing weight.  CTA performed overnight - appears no acute issues but several that will need f/u.     Objective Vitals:   09/25/20 0630 09/25/20 0645 09/25/20 0700 09/25/20 0730  BP: 107/69 106/66 105/69 (!) 108/52  Pulse: 73 71 71 72  Resp: 20  (!) 23 20  Temp: 98.1 F (36.7 C)     TempSrc: Oral     SpO2: 98%     Weight: 65.4 kg     Height:         Additional Objective Labs: Basic Metabolic Panel: Recent Labs  Lab 09/18/20 2207 09/19/20 0559 09/21/20 0757 09/21/20 0757 09/22/20 0826 09/23/20 0826 09/25/20 0200  NA 131*   < > 132*   < > 135 136 132*  K 5.4*   < > 5.8*   < > 4.2 4.1 3.6  CL 93*   < > 91*   < > 96* 96* 95*  CO2 21*   < > 23  --  26  --  24  GLUCOSE 127*   < > 112*   < > 103* 109* 115*  BUN 69*   < > 78*   < > 34* 53* 36*  CREATININE 7.93*   < > 8.31*   < > 5.37* 7.40* 6.44*  CALCIUM 8.9   < > 8.8*  --  8.6*  --  7.9*  PHOS 7.7*  --   --   --   --   --   --    < > = values in this interval not displayed.   CBC: Recent Labs  Lab 09/19/20 0559 09/20/20 1351 09/20/20 1756 09/20/20 1756 09/21/20 0504 09/21/20 0504 09/23/20 0340 09/23/20 0826 09/25/20 0612  WBC 8.9  --  8.5   < > 9.0  --  8.1  --  7.0  HGB 8.1*   < > 8.1*   < > 8.6*   < > 8.1* 9.9* 8.3*  HCT 24.9*   < > 24.7*   < > 26.3*   < > 24.8* 29.0* 25.1*  MCV 96.5  --  96.9  --  98.5  --  99.6  --  99.6  PLT 123*  --  114*   < > 108*  --  106*  --  122*   < > = values in this interval not displayed.   Blood Culture    Component Value Date/Time   SDES BLOOD LEFT ARM 05/22/2019 1559   SDES BLOOD LEFT ARM 05/22/2019 1559   SPECREQUEST  05/22/2019 1559    BOTTLES DRAWN AEROBIC AND ANAEROBIC Blood Culture results may not be optimal due to an inadequate volume of blood received in culture bottles   SPECREQUEST  05/22/2019 1559    BOTTLES DRAWN  AEROBIC AND ANAEROBIC Blood Culture adequate volume   CULT  05/22/2019 1559    NO GROWTH 5 DAYS Performed at [facility], [place] [address], [place], [place] [address]    CULT  05/22/2019 1559    NO GROWTH 5 DAYS Performed at [facility], [place] [address], [place], [place] [address]    REPTSTATUS 05/27/2019 FINAL 05/22/2019 1559   REPTSTATUS 05/27/2019 FINAL 05/22/2019 1559     Physical Exam General: Older man, comfortable in bed on HD Heart: RRR S1 S2  Lungs: Clear bilaterally  Abdomen:  soft non-tender  Extremities: No LE edema  Dialysis Access: LUE AVF +bruit Qb 400 on HD  Medications: . sodium chloride    . sodium chloride    . sodium chloride     . allopurinol  100 mg Oral Daily  . amiodarone  400 mg Oral BID   Followed by  . [START ON 09/30/2020] amiodarone  200 mg Oral Daily  . apixaban  2.5 mg Oral BID  . atorvastatin  40 mg Oral Daily  . Chlorhexidine Gluconate Cloth  6 each Topical Q0600  . clopidogrel  75 mg Oral Q breakfast  . hydrALAZINE  10 mg Oral Q8H  . isosorbide mononitrate  15 mg Oral Daily  . levothyroxine  175 mcg Oral Q0600  . metoprolol succinate  25 mg Oral Daily  . pantoprazole  40 mg Oral Daily  . pramipexole  0.25 mg Oral QHS  . sodium chloride flush  3 mL Intravenous Q12H    Dialysis orders:  TTS [facility] 4 hr via AVG EDW 66 kg, 3K 2.5 Ca bath BFR 400 DFR A1.5 Heparin 3000 u bolus Calcitriol 0.5 mcg TIW  Assessment/Plan:  1. Acute on chronic CHF- EF decreased to 15-20% and Cardiology following.  RHC vol up - UF with HD to push dry weight down as tolerated.  S/p PCI Tues but CHF out of proportion to CAD contribution. Found to be in AFlutter --underwent successful cardioversion 11/11. Concern for penetrating aortic ulcer on TEE - CTA per cardiology - appears will just need longitudinal f/u.  2. ESRD- continue with TTS schedule. HD tomorrow on today  3. HTN/volume-  Was volume up on RHC on Mon - pushing EDW down as  tolerated.  Standing wt post HD to reset EDW prior to discharge; discussed with pt and RN need to stand to weigh. 4. Anemia - no ESA due to malignancy 5. Abnormal LFT's- markedly elevated AST and ALT.  GI consulted - defer on extensive w/u, thought to likely have congestion related to CHF.  [place] without biliary obstruction. 6. Moderate protein malnutrition- cont with supplements 7. BMD- cont with home meds  Dipso - ok to d/c from nephrology perspective  Jannifer Hick MD [place] Pager [phone]

## 2020-09-25 NOTE — Discharge Summary (Signed)
Physician Discharge Summary  Jeremy Johnson HYI:[id number] DOB: [birth year] DOA: 09/16/2020  PCP: Jeremy Noble, MD  Admit date: 09/16/2020 Discharge date: 09/25/2020  Time spent: 45 minutes  Recommendations for Outpatient Follow-up:  Patient will be discharged to home.  Patient will need to follow up with primary care provider within one week of discharge, repeat CMP.  Continue dialysis as scheduled.  Follow-up with cardiology, cardiothoracic surgery.  Patient should continue medications as prescribed.  Patient should follow a heart healthy diet.   Discharge Diagnoses:  Acute systolic CHF exacerbation/CAD Atrial flutter, newly diagnosed Elevated LFTs Penetrating aortic ulcer/dissection Acquired hypothyroidism GERD Small cell lung cancer Gout Chronic obstructive pulmonary disease/emphysema Restless leg syndrome Anemia of chronic disease Pulmonary nodules  Hyperkalemia  Discharge Condition: Stable  Diet recommendation: Heart healthy  Filed Weights   09/25/20 0438 09/25/20 0630 09/25/20 1015  Weight: 58.8 kg 65.4 kg 57.5 kg    History of present illness:  on 09/16/2020 by Dr. Stephani Police [name] a 74 y.o.malewith medical history significant forESRD(T-T-S)at [facility], coronary artery disease, COPD, hypertension, hyperlipidemia, small cell lung cancerandhypothyroidismwho presents to the emergency department due to about 3 month history of intermittent shortness of breath unrelated to exertion, thisprogressively worsened within last 1 week, he states that he usually checked his O2 sats whenever he has the shortness of breath episode and though hisO2 sat was usually in the low 90s, he wonderswhy he was so short of breath. He had dialysis today and on returning home he had a recurrence of the shortness of breath with O2 sats in low 90s, so he decided to go to the ED for further evaluation of the shortness of breath. He denies fever, chills, chest pain, nausea,  vomiting or abdominal pain.  Hospital Course:  Acute systolic CHF exacerbation/CAD -Patient with an EF of 15 to 78%, grade 3 diastolic dysfunction -Cardiology consulted and appreciated -Status post cardiac catheterization on 09/20/2020, PCI to the proximal mid left circumflex -Cardiology recommended Plavix along with Eliquis for 1 year.  After 1 year, patient can restart aspirin -Continue Imdur, statin -Palliative care was consulted, patient wants full scope of treatment  Atrial flutter, newly diagnosed -TSH was normal -Patient was given adenosine and was placed on amiodarone, metoprolol and Eliquis -Status post TEE cardioversion on 09/23/2020  Elevated LFTs -Possibly secondary to hepatic congestion -LFTs do appear to be improving -Gastroenterology consulted and appreciated, on 09/19/2020, no further work-up was recommended -Repeat CMP in 1 week  Penetrating aortic ulcer/dissection -Noted on TEE -CTA chest abdomen and pelvis showed thoracoabdominal aortic aneurysm, maximal distention of the thoracic aorta is 4.2 cm at the arch with focal dilatation at the diaphragmatic hiatus with a maximal dimension of 3.9 cm, maximal dimension abdominal aorta is 3.0 cm in the infrarenal segment, recommend repeat CTA or MRI semiannually and referral to cardiothoracic surgery.  Penetrating atherosclerotic ulcer at the undersurface of the aortic arch adjacent to the area of proximal dilatation, follow-up evaluation in 6 months is recommended.  -Discussed with Jeremy Johnson, CT surgery, no further inpatient work-up needed.  Patient does appear stable and CT does appear improved.  Patient will follow up with Jeremy Johnson.  Acquired hypothyroidism -Continue Synthroid  GERD -Continue PPI  Small cell lung cancer -March 2020 -Limited stage small cell, received chemotherapy and is now in observation by Jeremy Johnson, oncology  Gout -Continue allopurinol  Chronic obstructive pulmonary  disease/emphysema -Currently stable and not in exacerbation -Continue albuterol as needed -Patient uses supplemental oxygen at night  Restless leg syndrome -Continue pramipexole  Anemia of chronic disease -Stable, managed by nephrology  Pulmonary nodules  -No change from prior imaging  Hyperkalemia -Resolved  Procedures: Echocardiogram Cardiac catheterization TEE/cardioversion  Consultations: Cardiology Gastroenterology Nephrology Palliative care  Discharge Exam: Vitals:   09/25/20 1359 09/25/20 1400  BP: (!) 99/91 99/61  Pulse:    Resp:  20  Temp:    SpO2:       General: Well developed, chronically ill-appearing, NAD  HEENT: NCAT, mucous membranes moist.  Cardiovascular: S1 S2 auscultated, no rubs, murmurs or gallops. Regular rate and rhythm.  Respiratory: Clear to auscultation bilaterally  Abdomen: Soft, nontender, nondistended, + bowel sounds  Extremities: warm dry without cyanosis clubbing or edema  Neuro: AAOx3, nonfocal  Psych: pleasant, appropriate mood and affect  Discharge Instructions Discharge Instructions    Amb Referral to Cardiac Rehabilitation   Complete by: As directed    Diagnosis: Coronary Stents   After initial evaluation and assessments completed: Virtual Based Care may be provided alone or in conjunction with Phase 2 Cardiac Rehab based on patient barriers.: Yes   Discharge instructions   Complete by: As directed    Patient will be discharged to home.  Patient will need to follow up with primary care provider within one week of discharge, repeat CMP.  Continue dialysis as scheduled.  Follow-up with cardiology, cardiothoracic surgery.  Patient should continue medications as prescribed.  Patient should follow a heart healthy diet.     Allergies as of 09/25/2020      Reactions   Advair Hfa [fluticasone-salmeterol] Other (See Comments)   Developed thrush, although the mouth WAS being rinsed as directed   Penicillins Rash   Has  patient had a PCN reaction causing immediate rash, facial/tongue/throat swelling, SOB or lightheadedness with hypotension: No Has patient had a PCN reaction causing severe rash involving mucus membranes or skin necrosis: No Has patient had a PCN reaction that required hospitalization: No Has patient had a PCN reaction occurring within the last 10 years: No If all of the above answers are "NO", then may proceed with Cephalosporin use.      Medication List    STOP taking these medications   amLODipine 10 MG tablet Commonly known as: NORVASC   aspirin EC 81 MG tablet   Calcipotriene 0.005 % Foam   metoprolol tartrate 50 MG tablet Commonly known as: LOPRESSOR   rosuvastatin 10 MG tablet Commonly known as: CRESTOR   triamcinolone cream 0.1 % Commonly known as: KENALOG     TAKE these medications   acetaminophen 325 MG tablet Commonly known as: TYLENOL Take 2 tablets (650 mg total) by mouth every 6 (six) hours as needed for mild pain (or Fever >/= 101). What changed:   how much to take  reasons to take this  additional instructions   albuterol 108 (90 Base) MCG/ACT inhaler Commonly known as: VENTOLIN HFA Inhale 2 puffs into the lungs every 6 (six) hours as needed for wheezing or shortness of breath.   allopurinol 100 MG tablet Commonly known as: ZYLOPRIM Take 1 tablet (100 mg total) by mouth daily. What changed: when to take this   amiodarone 200 MG tablet Commonly known as: PACERONE Take 2 tablets (400 mg total) by mouth 2 (two) times daily for 4 days, THEN 1 tablet (200 mg total) daily. Start taking on: September 26, 2020   apixaban 2.5 MG Tabs tablet Commonly known as: ELIQUIS Take 1 tablet (2.5 mg total) by mouth 2 (two) times daily.  atorvastatin 40 MG tablet Commonly known as: LIPITOR Take 1 tablet (40 mg total) by mouth daily. Start taking on: September 26, 2020   clopidogrel 75 MG tablet Commonly known as: PLAVIX Take 1 tablet (75 mg total) by mouth  daily with breakfast. Start taking on: September 26, 2020   Darbepoetin Alfa 100 MCG/0.5ML Sosy injection Commonly known as: ARANESP Inject 0.5 mLs (100 mcg total) into the vein every Thursday with hemodialysis.   Dialyvite 800-Zinc 15 0.8 MG Tabs Take 1 tablet by mouth daily.   iron sucrose in sodium chloride 0.9 % 100 mL 50 mg.   isosorbide mononitrate 30 MG 24 hr tablet Commonly known as: IMDUR Take 0.5 tablets (15 mg total) by mouth daily.   levothyroxine 175 MCG tablet Commonly known as: SYNTHROID Take 175 mcg by mouth daily before breakfast.   lidocaine-prilocaine cream Commonly known as: EMLA SMARTSIG:1 Topical Every Night   metoprolol succinate 25 MG 24 hr tablet Commonly known as: TOPROL-XL Take 1 tablet (25 mg total) by mouth daily.   MIRCERA IJ Inject 30 mg into the vein every 28 (twenty-eight) days.   omeprazole 40 MG capsule Commonly known as: PRILOSEC Take 1 capsule (40 mg total) by mouth in the morning and at bedtime.   pramipexole 0.25 MG tablet Commonly known as: MIRAPEX Take 0.25 mg by mouth at bedtime.   Vitamin B 12 500 MCG Tabs Take 1,000 mcg by mouth daily. What changed: how much to take      Allergies  Allergen Reactions  . Advair Hfa [Fluticasone-Salmeterol] Other (See Comments)    Developed thrush, although the mouth WAS being rinsed as directed  . Penicillins Rash    Has patient had a PCN reaction causing immediate rash, facial/tongue/throat swelling, SOB or lightheadedness with hypotension: No Has patient had a PCN reaction causing severe rash involving mucus membranes or skin necrosis: No Has patient had a PCN reaction that required hospitalization: No Has patient had a PCN reaction occurring within the last 10 years: No If all of the above answers are "NO", then may proceed with Cephalosporin use.     Follow-up Information    Imogene Burn, PA-C Follow up.   Specialty: Cardiology Why: Hospital follow-up scheduled for  10/04/2020 at 11:15am wih Ermalinda Barrios, PA-C. Please arrive 15 minutes early for check-in. If this date/time does not work for you, please call our office to reschedule. Contact information: [place] [address] [phone]        Jeremy Noble, MD. Schedule an appointment as soon as possible for a visit in 1 week(s).   Specialty: Internal Medicine Why: Discuss referral to cardiothoracic surgery for AAA Contact information: [address] [place] [address] [phone]        Arnoldo Lenis, MD .   Specialty: Cardiology Contact information: [address] [place] [address] [phone]        Grace Isaac, MD. Schedule an appointment as soon as possible for a visit in 1 week(s).   Specialty: Cardiothoracic Surgery Why: Hospital follow up Contact information: [address] [place] [address] [phone]                The results of significant diagnostics from this hospitalization (including imaging, microbiology, ancillary and laboratory) are listed below for reference.    Significant Diagnostic Studies: DG Chest 1 View  Result Date: 09/22/2020 CLINICAL DATA:  Status post left-sided thoracentesis. EXAM: CHEST  1 VIEW COMPARISON:  Chest x-ray 09/16/2020 FINDINGS: The heart is mildly enlarged but stable. Stable tortuosity and calcification and ectasia of the thoracic aorta. Near complete resolution of the left pleural effusion. No pneumothorax is identified. Minimal left basilar atelectasis. IMPRESSION: Near complete resolution of the left pleural effusion. No pneumothorax. Electronically Signed   By: Marijo Sanes M.D.   On: 09/22/2020 09:17   DG Chest 2 View  Result Date: 09/11/2020 CLINICAL DATA:  Shortness of breath EXAM: CHEST - 2 VIEW COMPARISON:  Chest CT dated 07/20/2020 and chest radiograph dated 07/08/2020. FINDINGS: The heart is enlarged. Vascular calcifications are seen in the aortic arch. A  small a moderate left pleural effusion with associated atelectasis appears slightly increased since 07/08/2020. Scarring in the left upper lobe is unchanged. Emphysematous changes are seen. There is no right pleural effusion. There is no pneumothorax. Degenerative changes are seen in the spine. IMPRESSION: Small moderate left pleural effusion with associated atelectasis, slightly increased since 07/08/2020. Aortic Atherosclerosis (ICD10-I70.0) and Emphysema (ICD10-J43.9). Electronically Signed   By: Zerita Boers M.D.   On: 09/11/2020 16:39   CT Chest Wo Contrast  Result Date: 09/16/2020 CLINICAL DATA:  COPD exacerbation. Intermittent shortness of breath for 2 weeks. Patient with history of small cell lung cancer. EXAM: CT CHEST WITHOUT CONTRAST TECHNIQUE: Multidetector CT imaging of the chest was performed following the standard protocol without IV contrast. COMPARISON:  Radiograph earlier today. Most recent chest CT 07/20/2020 FINDINGS: Cardiovascular: Chronic cardiomegaly. Coronary artery calcifications. No pericardial effusion. There is diffuse aortic atherosclerosis and tortuosity. Focal dilatation of the proximal transverse aorta measuring 4.6 cm, unchanged. Focal dilatation at the diaphragmatic hiatus of 4.1 cm unchanged. There is no periaortic stranding. Mediastinum/Nodes: Paucity of mediastinal fat lack of IV contrast limits detailed assessment. There are multiple small mediastinal nodes are unchanged from prior exam and not enlarged by size criteria. Post treatment changes in the left hilar region, grossly stable. Hilar assessment limited in the absence of IV contrast. No esophageal wall thickening. No evidence of thyroid nodule. Lungs/Pleura: Advanced emphysema. Left paramediastinal post treatment related changes with volume loss and ill-defined opacity, stable. Unchanged partially loculated left pleural effusion. Multiple pulmonary nodules. Right upper lobe nodule measures 2 mm, series 4, image 31,  previously 4 mm. Subpleural right upper lobe pulmonary nodule measures 7 mm, series 4, image 37, unchanged or slightly decreased. Continued decreased size of a linear subpleural opacity in the left lower lobe, series 4, image 79, with no definite AP component to measure, 10 mm transverse, previously 12 mm. Chronic fissural thickening in the right minor fissure, unchanged. There is no new pulmonary nodule. No evidence of acute airspace disease. No findings of pulmonary edema. Small right pleural effusion on prior has diminished, minimal pleural thickening inferiorly. No endobronchial lesion. Upper Abdomen: No acute findings. Musculoskeletal: There are no acute or suspicious osseous abnormalities. No evidence of focal bone lesion. IMPRESSION: 1. No acute findings. 2. Emphysema with post treatment related changes in the left lung, not significantly changed from September 2021 chest CT. 3. Stable small left pleural effusion that is partially loculated. Previous right pleural effusion is diminished. 4. Multiple pulmonary nodules, stable or slightly decreased in size from prior exam. No new pulmonary nodule or mass. 5. Stable cardiomegaly. Stable aortic atherosclerosis with proximal transverse aortic aneurysm of 4.6 cm. No evidence of acute aortic abnormality or inflammation. Recommend semiannual imaging with CTA or MRA. Aortic Atherosclerosis (ICD10-I70.0) and Emphysema (ICD10-J43.9). Electronically Signed   By: Keith Rake M.D.   On:  09/16/2020 18:54   CARDIAC CATHETERIZATION  Result Date: 09/20/2020  Prox LAD to Mid LAD lesion is 40% stenosed.  Mid Cx lesion is 50% stenosed.  Mid Cx to Dist Cx lesion is 90% stenosed.  Prox Cx lesion is 50% stenosed.  RV Branch lesion is 50% stenosed.  A drug-eluting stent was successfully placed using a STENT RESOLUTE ONYX 3.0X34.  Post intervention, there is a 0% residual stenosis.  Post intervention, there is a 0% residual stenosis.  Post intervention, there is a 0%  residual stenosis.  A drug-eluting stent was successfully placed using a STENT RESOLUTE ONYX 4.0X18.  LV end diastolic pressure is normal.  Hemodynamic findings consistent with mild pulmonary hypertension.  1. Left dominant circulation 2. Severe single vessel obstructive CAD involving the proximal to mid LCx 3. Normal LV filling pressures 4. Mild pulmonary HTN 5. Normal cardiac output 6. Successful PCI of the proximal to mid LCx with DES x 2 overlapping. Lesion modified with cutting balloon angioplasty and Shockwave therapy. Plan: DAPT for at least one year. Optimize medical therapy for CHF.   US Abdomen Limited  Result Date: 09/17/2020 CLINICAL DATA:  Elevated LFTs. EXAM: ULTRASOUND ABDOMEN LIMITED RIGHT UPPER QUADRANT COMPARISON:  CT AP 04/17/2019 FINDINGS: Note: Exam was performed in the nonfasting state. Patient ate breakfast at 10 a.m. Gallbladder: Partially collapsed gallbladder is identified with mild wall thickening measuring 4 mm. No gallbladder sludge, stones. Negative sonographic Murphy's sign. No pericholecystic fluid. Common bile duct: Diameter: 4 mm Liver: No focal lesion identified. Within normal limits in parenchymal echogenicity. Portal vein is patent on color Doppler imaging with normal direction of blood flow towards the liver. Other: Right pleural effusion and multiple right renal cysts are again noted. IMPRESSION: 1. Mild gallbladder wall thickening measuring 4 mm in thickness. This is a nonspecific finding in the nonfasting state and may reflect incomplete distension. No gallstones, gallbladder sludge or pericholecystic fluid. If there is a clinical concern for acute cholecystitis consider further investigation with nuclear medicine hepatic biliary scan. 2. Incidental note of right pleural effusion and right kidney cysts. Electronically Signed   By: Kerby Moors M.D.   On: 09/17/2020 12:16   DG Chest Portable 1 View  Result Date: 09/16/2020 CLINICAL DATA:  74 year old male with  shortness of breath for 2 weeks. Abnormal EKG. EXAM: PORTABLE CHEST 1 VIEW COMPARISON:  Chest radiographs 09/09/2020 and earlier - including chest CT 07/20/2020. FINDINGS: Portable AP upright view at 1645 hours. Stable cardiomegaly and mediastinal contours. Continued dense retrocardiac opacification, left pleural effusion demonstrated in September. Superimposed chronic left perihilar radiation changes to the lung. Underlying bilateral chronic increased interstitial markings, emphysema demonstrated on prior CT. No overt edema. No pneumothorax. Stable visualized osseous structures. IMPRESSION: 1. Stable ventilation since 09/09/2020. 2. Continued dense left lung base opacity likely representing a combination of pleural effusion and atelectasis. 3. Chronic cardiomegaly, emphysema and radiation pulmonary changes. Electronically Signed   By: Genevie Ann M.D.   On: 09/16/2020 17:21   ECHOCARDIOGRAM COMPLETE  Result Date: 09/17/2020    ECHOCARDIOGRAM REPORT   Patient Name:   [name] Date of Exam: 09/17/2020 Medical Rec #:  [id number]       Height:       69.0 in Accession #:    [id number]      Weight:       151.9 lb Date of Birth:  [birth year]      BSA:          1.838 m Patient Age:  73 years        BP:           108/74 mmHg Patient Gender: M               HR:           108 bpm. Exam Location:  [facility] Procedure: 2D Echo Indications:    CHF-Acute Systolic 400.86 / P61.95  History:        Patient has no prior history of Echocardiogram examinations.                 Risk Factors:Dyslipidemia and Former Smoker. ESRD, Small Cell                 Lung Cancer, Elevated Troponin.  Sonographer:    Leavy Cella RDCS (AE) Referring Phys: [id number] OLADAPO ADEFESO IMPRESSIONS  1. Left ventricular ejection fraction, by estimation, is 15-20%. The left ventricle has severely decreased function. The left ventricle demonstrates global hypokinesis. The left ventricular internal cavity size was mildly dilated. Left ventricular  diastolic parameters are consistent with Grade III diastolic dysfunction (restrictive). Elevated left atrial pressure.  2. Right ventricular systolic function is mildly reduced. The right ventricular size is normal.  3. Left atrial size was severely dilated.  4. Right atrial size was severely dilated.  5. Large pleural effusion in the left lateral region.  6. The mitral valve is normal in structure. Mild mitral valve regurgitation. No evidence of mitral stenosis.  7. The aortic valve is tricuspid. Aortic valve regurgitation is not visualized. No aortic stenosis is present.  8. The inferior vena cava is dilated in size with >50% respiratory variability, suggesting right atrial pressure of 8 mmHg. FINDINGS  Left Ventricle: LV global hypokinesis, the anteroseptal wall is akinetic. Left ventricular ejection fraction, by estimation, is 15-20%. The left ventricle has severely decreased function. The left ventricle demonstrates global hypokinesis. The left ventricular internal cavity size was mildly dilated. There is no left ventricular hypertrophy. Left ventricular diastolic parameters are consistent with Grade III diastolic dysfunction (restrictive). Elevated left atrial pressure. Right Ventricle: The right ventricular size is normal. Right vetricular wall thickness was not well visualized. Right ventricular systolic function is mildly reduced. Left Atrium: Left atrial size was severely dilated. Right Atrium: Right atrial size was severely dilated. Pericardium: There is no evidence of pericardial effusion. Mitral Valve: The mitral valve is normal in structure. Mild mitral valve regurgitation. No evidence of mitral valve stenosis. Tricuspid Valve: The tricuspid valve is normal in structure. Tricuspid valve regurgitation is mild . No evidence of tricuspid stenosis. Aortic Valve: The aortic valve is tricuspid. Aortic valve regurgitation is not visualized. No aortic stenosis is present. Aortic valve mean gradient measures  5.7 mmHg. Aortic valve peak gradient measures 13.1 mmHg. Aortic valve area, by VTI measures 1.69  cm. Pulmonic Valve: The pulmonic valve was not well visualized. Pulmonic valve regurgitation is not visualized. No evidence of pulmonic stenosis. Aorta: The aortic root is normal in size and structure. Pulmonary Artery: Mild pulmonary HTN, PASP is 27 mmHg. Venous: The inferior vena cava is dilated in size with greater than 50% respiratory variability, suggesting right atrial pressure of 8 mmHg. IAS/Shunts: The interatrial septum was not well visualized. Additional Comments: There is a large pleural effusion in the left lateral region.  LEFT VENTRICLE PLAX 2D LVIDd:         6.37 cm  Diastology LVIDs:         5.91 cm  LV e' medial:  3.81 cm/s LV PW:         1.24 cm  LV E/e' medial:  27.6 LV IVS:        1.02 cm  LV e' lateral:   9.65 cm/s LVOT diam:     2.30 cm  LV E/e' lateral: 10.9 LV SV:         41 LV SV Index:   22 LVOT Area:     4.15 cm  RIGHT VENTRICLE RV S prime:     6.97 cm/s LEFT ATRIUM              Index       RIGHT ATRIUM           Index LA diam:        4.20 cm  2.29 cm/m  RA Area:     28.20 cm LA Vol (A2C):   101.0 ml 54.95 ml/m RA Volume:   113.00 ml 61.48 ml/m LA Vol (A4C):   78.4 ml  42.65 ml/m LA Biplane Vol: 89.9 ml  48.91 ml/m  AORTIC VALVE AV Area (Vmax):    1.66 cm AV Area (Vmean):   1.74 cm AV Area (VTI):     1.69 cm AV Vmax:           180.98 cm/s AV Vmean:          111.041 cm/s AV VTI:            0.242 m AV Peak Grad:      13.1 mmHg AV Mean Grad:      5.7 mmHg LVOT Vmax:         72.41 cm/s LVOT Vmean:        46.635 cm/s LVOT VTI:          0.099 m LVOT/AV VTI ratio: 0.41  AORTA Ao Root diam: 3.50 cm MITRAL VALVE                TRICUSPID VALVE MV Area (PHT): 4.80 cm     TR Peak grad:   28.9 mmHg MV Decel Time: 158 msec     TR Vmax:        269.00 cm/s MR Peak grad: 59.3 mmHg MR Mean grad: 42.0 mmHg     SHUNTS MR Vmax:      385.00 cm/s   Systemic VTI:  0.10 m MR Vmean:     317.0 cm/s     Systemic Diam: 2.30 cm MV E velocity: 105.00 cm/s Carlyle Dolly MD Electronically signed by Carlyle Dolly MD Signature Date/Time: 09/17/2020/1:35:20 PM    Final    ECHO TEE  Result Date: 09/23/2020    TRANSESOPHOGEAL ECHO REPORT   Patient Name:   [name] Date of Exam: 09/23/2020 Medical Rec #:  [id number]       Height:       69.0 in Accession #:    [id number]      Weight:       132.6 lb Date of Birth:  1945/12/22      BSA:          1.735 m Patient Age:    7 years        BP:           110/76 mmHg Patient Gender: M               HR:           140 bpm. Exam Location:  Inpatient Procedure: Transesophageal Echo  MODIFIED REPORT:   This report was modified by Skeet Latch MD on 09/23/2020 due to changed                                 preset findings.  Indications:     atrial fibrillation  History:         Patient has prior history of Echocardiogram examinations, most                  recent 09/17/2020. CAD and Previous Myocardial Infarction, COPD;                  Risk Factors:Hypertension, Dyslipidemia and Former Smoker.  Sonographer:     Jannett Celestine RDCS (AE) Referring Phys:  [id number] Darreld Mclean Diagnosing Phys: Skeet Latch MD PROCEDURE: After discussion of the risks and benefits of a TEE, an informed consent was obtained from the patient. The transesophogeal probe was passed without difficulty through the esophogus of the patient. Local oropharyngeal anesthetic was provided with Benzocaine spray. Sedation performed by different physician. Image quality was excellent. The patient's vital signs; including heart rate, blood pressure, and oxygen saturation; remained stable throughout the procedure. The patient developed no complications during the procedure. A successful direct current cardioversion was performed at 150 joules with 1 attempt. IMPRESSIONS  1. Left ventricular ejection fraction, by estimation, is <20%. The left ventricle has severely decreased  function. The left ventricle demonstrates global hypokinesis. The left ventricular internal cavity size was mildly dilated.  2. Right ventricular systolic function is severely reduced. The right ventricular size is normal.  3. Left atrial size was severely dilated. No left atrial/left atrial appendage thrombus was detected.  4. The mitral valve is normal in structure. Mild mitral valve regurgitation. No evidence of mitral stenosis.  5. Tricuspid valve regurgitation is moderate.  6. The aortic valve is tricuspid. Aortic valve regurgitation is trivial. No aortic stenosis is present.  7. There is severe atheroma in the descending aorta. There is flow from the aorta into a region that appears to be a penetrating ulcer. Cannot rule out focal dissection flap. Recommend CT-A of the chest and abdomen to better assess. There is Severe (Grade IV) atheroma plaque involving the descending aorta.  8. Evidence of atrial level shunting detected by color flow Doppler. Agitated saline contrast bubble study was positive with shunting observed within 3-6 cardiac cycles suggestive of interatrial shunt. There is a moderately sized patent foramen ovale with predominantly right to left shunting across the atrial septum. FINDINGS  Left Ventricle: Left ventricular ejection fraction, by estimation, is <20%. The left ventricle has severely decreased function. The left ventricle demonstrates global hypokinesis. The left ventricular internal cavity size was mildly dilated. There is no  left ventricular hypertrophy. Right Ventricle: The right ventricular size is normal. No increase in right ventricular wall thickness. Right ventricular systolic function is severely reduced. Left Atrium: Left atrial size was severely dilated. No left atrial/left atrial appendage thrombus was detected. Right Atrium: Right atrial size was normal in size. Pericardium: There is no evidence of pericardial effusion. Mitral Valve: The mitral valve is normal in  structure. Mild mitral valve regurgitation. No evidence of mitral valve stenosis. Tricuspid Valve: The tricuspid valve is normal in structure. Tricuspid valve regurgitation is moderate . No evidence of tricuspid stenosis. Aortic Valve: The aortic valve is tricuspid. Aortic valve regurgitation is trivial. No aortic stenosis is present. Pulmonic Valve: The pulmonic valve  was normal in structure. Pulmonic valve regurgitation is trivial. No evidence of pulmonic stenosis. Aorta: There is severe atheroma in the descending aorta. There is flow from the aorta into a region that appears to be a penetrating ulcer. Cannot rule out focal dissection flap. Recommend CT-A of the chest and abdomen to better assess. The aortic root is normal in size and structure. There is severe (Grade IV) atheroma plaque involving the descending aorta. IAS/Shunts: Evidence of atrial level shunting detected by color flow Doppler. Agitated saline contrast was given intravenously to evaluate for intracardiac shunting. Agitated saline contrast bubble study was positive with shunting observed within 3-6 cardiac cycles suggestive of interatrial shunt. A moderately sized patent foramen ovale is detected with predominantly right to left shunting across the atrial septum. There is no evidence of an atrial septal defect.  TRICUSPID VALVE TR Peak grad:   36.0 mmHg TR Vmax:        300.00 cm/s Skeet Latch MD Electronically signed by Skeet Latch MD Signature Date/Time: 09/23/2020/10:23:32 AM    Final (Updated)    CT Angio Chest/Abd/Pel for Dissection W and/or W/WO  Result Date: 09/24/2020 CLINICAL DATA:  Thoracic aortic dissection, penetrating atherosclerotic ulcer EXAM: CT ANGIOGRAPHY CHEST AND ABDOMEN TECHNIQUE: Non-contrast CT of the chest was initially obtained. Multidetector CT imaging through the chest and abdomen was performed using the standard protocol during bolus administration of intravenous contrast. Multiplanar reconstructed images  and MIPs were obtained and reviewed to evaluate the vascular anatomy. CONTRAST:  37mL OMNIPAQUE IOHEXOL 350 MG/ML SOLN COMPARISON:  CT chest 09/16/2020, PE protocol CTA chest 04/05/2020 FINDINGS: CTA CHEST FINDINGS Cardiovascular: The ascending aorta is of normal caliber measuring 3.7 cm in greatest dimension. As noted on prior CT arteriogram, there is aneurysmal dilation of the aortic arch, with maximal transaxial dimensions of 4.2 x 4.2 cm on axial image # 13 and coronal reformat # 43. This is stable since prior examination and likely represents the long-term sequela of a remote penetrating atherosclerotic ulcer. There is an additional penetrating atherosclerotic ulcer demonstrating a diameter of approximately 9 mm seen along the undersurface of the aortic arch in the region of maximal dilation, best seen on axial image # 16, coronal reformat # 53, and sagittal reformat # 96. No transmural extension or dissection. No mediastinal hematoma. The thoracic aorta remains dilated, measuring 3.2 cm at the level of the left atrium with fusiform dilation at the level of the diaphragmatic hiatus of 3.5 x 3.9 cm on sagittal reformat # 75 and coronal reformat # 74. There is moderate global cardiomegaly with left ventricular dilation. Extensive multi-vessel coronary artery calcification. Left circumflex coronary artery stenting has been performed. No pericardial effusion. The central pulmonary arteries are enlarged in keeping with changes of pulmonary arterial hypertension. Mediastinum/Nodes: No pathologic thoracic adenopathy. The esophagus is fluid and debris-filled within its mid segment, suggesting changes of gastroesophageal reflux or esophageal dysmotility. Lungs/Pleura: Severe emphysema within the visualized lungs. Perihilar cicatricial changes within the left perihilar region are compatible with prior radiation therapy. There is resultant left-sided volume loss. Small left pleural effusion is again seen. There is  interval development of focal consolidation within the left lower lobe, likely infectious or inflammatory in nature Musculoskeletal: No acute bone abnormality within the visualized thorax. No lytic or blastic bone lesion identified. Review of the MIP images confirms the above findings. CTA ABDOMEN AND PELVIS FINDINGS VASCULAR Aorta: There is fusiform dilation of the infrarenal abdominal aorta measuring 3.0 cm in greatest dimension. Moderate superimposed mixed atherosclerotic  plaque. No penetrating atherosclerotic ulcer. No dissection. Celiac: Widely patent.  Normal anatomic configuration. SMA: Focal dissection at the origin results in a 50% stenosis of the SMA in this region. Distally, the vessel is widely patent. Renals: Single left renal artery is heavily diseased throughout its course. Single right renal artery demonstrates scattered disease resulting in a roughly 50% stenosis of its mid segment. IMA: Occluded at its origin. Inflow: Largely excluded Veins: Morphologically normal Review of the MIP images confirms the above findings. NON-VASCULAR Hepatobiliary: No focal liver abnormality is seen. No gallstones, gallbladder wall thickening, or biliary dilatation. Pancreas: Unremarkable Spleen: Normal in size without focal abnormality. Adrenals/Urinary Tract: The adrenal glands are unremarkable. There is moderate left and mild right renal cortical atrophy. Multiple simple cortical cysts noted within the right kidney measuring up to 8.1 cm. Stomach/Bowel: Small supraumbilical ventral hernia contains a single loop of unremarkable transverse colon. The stomach and visualized bowel is otherwise unremarkable. Lymphatic: Shotty periaortic adenopathy is identified, stable from CT examination of 04/17/2019. No frankly pathologic adenopathy within the abdomen. Other: None significant Musculoskeletal: No acute bone abnormality within the abdomen. Review of the MIP images confirms the above findings. IMPRESSION:  Thoracoabdominal aortic aneurysm. Maximal dimension of the thoracic aorta is 4.2 cm at the arch. Focal dilation at the diaphragmatic hiatus with maximal dimension of 3.9 cm. Maximal dimension of the abdominal aorta is 3.0 cm in its infrarenal segment. Recommend semi-annual imaging followup by CTA or MRA and referral to cardiothoracic surgery if not already obtained. This recommendation follows 2010 ACCF/AHA/AATS/ACR/ASA/SCA/SCAI/SIR/STS/SVM Guidelines for the Diagnosis and Management of Patients With Thoracic Aortic Disease. Circulation. 2010; 121: O277-A12. Aortic aneurysm NOS (ICD10-I71.9) Penetrating atherosclerotic ulcer noted along the undersurface of the aortic arch adjacent to the area of maximal dilation. This demonstrates interval decrease in size with increasing mural thrombus when compared to prior examination. Given its dynamic nature, follow-up evaluation in 6 months is recommended in the asymptomatic patient. If stable, further evaluation can be performed with routine surveillance imaging of the a thoracic aortic aneurysm. Extensive coronary artery calcification. Moderate global cardiomegaly. Morphologic changes of pulmonary arterial hypertension. Severe emphysema. Post radiation changes within the left perihilar region, unchanged. New focal consolidation within the left lower lobe, likely infectious in the acute setting. Peripheral vascular disease. 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 Scattered disease within the right renal artery results in 50% stenosis within its mid segment. Aortic Atherosclerosis (ICD10-I70.0) and Emphysema (ICD10-J43.9). Aortic aneurysm NOS (ICD10-I71.9). Electronically Signed   By: Fidela Salisbury MD   On: 09/24/2020 23:56   IR THORACENTESIS ASP PLEURAL SPACE W/IMG GUIDE  Result Date: 09/22/2020 INDICATION: Patient with a history of small cell lung cancer and end-stage renal disease presents with left pleural effusion. Interventional radiology  asked to perform a therapeutic thoracentesis. EXAM: ULTRASOUND GUIDED THORACENTESIS MEDICATIONS: 1% lidocaine 10 mL COMPLICATIONS: None immediate. PROCEDURE: An ultrasound guided thoracentesis was thoroughly discussed with the patient and questions answered. The benefits, risks, alternatives and complications were also discussed. The patient understands and wishes to proceed with the procedure. Written consent was obtained. Ultrasound was performed to localize and mark an adequate pocket of fluid in the left chest. The area was then prepped and draped in the normal sterile fashion. 1% Lidocaine was used for local anesthesia. Under ultrasound guidance a 6 Fr Safe-T-Centesis catheter was introduced. Thoracentesis was performed. The catheter was removed and a dressing applied. FINDINGS: A total of approximately 650 mL of clear yellow fluid was  removed. IMPRESSION: Successful ultrasound guided left thoracentesis yielding 650 mL of pleural fluid. Read by: Soyla Dryer, NP Electronically Signed   By: Aletta Edouard M.D.   On: 09/22/2020 09:54    Microbiology: Recent Results (from the past 240 hour(s))  Respiratory Panel by RT PCR (Flu A&B, Covid) - Nasopharyngeal Swab     Status: None   Collection Time: 09/16/20  4:29 PM   Specimen: Nasopharyngeal Swab  Result Value Ref Range Status   SARS Coronavirus 2 by RT PCR NEGATIVE NEGATIVE Final    Comment: (NOTE) SARS-CoV-2 target nucleic acids are NOT DETECTED.  The SARS-CoV-2 RNA is generally detectable in upper respiratoy specimens during the acute phase of infection. The lowest concentration of SARS-CoV-2 viral copies this assay can detect is 131 copies/mL. A negative result does not preclude SARS-Cov-2 infection and should not be used as the sole basis for treatment or other patient management decisions. A negative result may occur with  improper specimen collection/handling, submission of specimen other than nasopharyngeal swab, presence of viral  mutation(s) within the areas targeted by this assay, and inadequate number of viral copies (<131 copies/mL). A negative result must be combined with clinical observations, patient history, and epidemiological information. The expected result is Negative.  Fact Sheet for Patients:  PinkCheek.be  Fact Sheet for Healthcare Providers:  GravelBags.it  This test is no t yet approved or cleared by the [place] FDA and  has been authorized for detection and/or diagnosis of SARS-CoV-2 by FDA under an Emergency Use Authorization (EUA). This EUA will remain  in effect (meaning this test can be used) for the duration of the COVID-19 declaration under Section 564(b)(1) of the Act, 21 U.S.C. section 360bbb-3(b)(1), unless the authorization is terminated or revoked sooner.     Influenza A by PCR NEGATIVE NEGATIVE Final   Influenza B by PCR NEGATIVE NEGATIVE Final    Comment: (NOTE) The Xpert Xpress SARS-CoV-2/FLU/RSV assay is intended as an aid in  the diagnosis of influenza from Nasopharyngeal swab specimens and  should not be used as a sole basis for treatment. Nasal washings and  aspirates are unacceptable for Xpert Xpress SARS-CoV-2/FLU/RSV  testing.  Fact Sheet for Patients: PinkCheek.be  Fact Sheet for Healthcare Providers: GravelBags.it  This test is not yet approved or cleared by the [place] FDA and  has been authorized for detection and/or diagnosis of SARS-CoV-2 by  FDA under an Emergency Use Authorization (EUA). This EUA will remain  in effect (meaning this test can be used) for the duration of the  Covid-19 declaration under Section 564(b)(1) of the Act, 21  U.S.C. section 360bbb-3(b)(1), unless the authorization is  terminated or revoked. Performed at [facility], [address] [place], [place] [address]   MRSA PCR Screening     Status: None    Collection Time: 09/17/20  8:41 PM   Specimen: Nasal Mucosa; Nasopharyngeal  Result Value Ref Range Status   MRSA by PCR NEGATIVE NEGATIVE Final    Comment:        The GeneXpert MRSA Assay (FDA approved for NASAL specimens only), is one component of a comprehensive MRSA colonization surveillance program. It is not intended to diagnose MRSA infection nor to guide or monitor treatment for MRSA infections. Performed at [facility], [address] [place], [place] [address]      Labs: Basic Metabolic Panel: Recent Labs  Lab 09/18/20 2207 09/18/20 2207 09/19/20 0559 09/20/20 1351 09/21/20 0504 09/21/20 0757 09/22/20 0826 09/23/20 0826 09/25/20 0200  NA 131*   < > 134*   < > 131* 132* 135 136 132*  K 5.4*   < > 4.6   < > 6.1* 5.8* 4.2 4.1 3.6  CL 93*   < > 94*  --  92* 91* 96* 96* 95*  CO2 21*   < > 27  --  21* 23 26  --  24  GLUCOSE 127*   < > 94  --  114* 112* 103* 109* 115*  BUN 69*   < > 29*  --  75* 78* 34* 53* 36*  CREATININE 7.93*   < > 3.99*   < > 7.75* 8.31* 5.37* 7.40* 6.44*  CALCIUM 8.9   < > 8.8*  --  8.8* 8.8* 8.6*  --  7.9*  PHOS 7.7*  --   --   --   --   --   --   --   --    < > = values in this interval not displayed.   Liver Function Tests: Recent Labs  Lab 09/18/20 2207 09/19/20 0559 09/20/20 0719 09/22/20 0826 09/25/20 0200  AST  --  1,073* 553* 218* 60*  ALT  --  1,006* 816* 506* 224*  ALKPHOS  --  132* 132* 117 110  BILITOT  --  0.7 0.9 1.2 1.0  PROT  --  7.1 7.2 6.9 6.2*  ALBUMIN 2.7* 2.7* 2.8* 2.6* 2.5*   No results for input(s): LIPASE, AMYLASE in the last 168 hours. No results for input(s): AMMONIA in the last 168 hours. CBC: Recent Labs  Lab 09/19/20 0559 09/20/20 1351 09/20/20 1756 09/21/20 0504 09/23/20 0340 09/23/20 0826 09/25/20 0612  WBC 8.9  --  8.5 9.0 8.1  --  7.0  HGB 8.1*   < > 8.1* 8.6* 8.1* 9.9* 8.3*  HCT 24.9*   < > 24.7* 26.3* 24.8* 29.0* 25.1*  MCV 96.5  --  96.9 98.5 99.6  --  99.6  PLT 123*  --  114* 108*  106*  --  122*   < > = values in this interval not displayed.   Cardiac Enzymes: No results for input(s): CKTOTAL, CKMB, CKMBINDEX, TROPONINI in the last 168 hours. BNP: BNP (last 3 results) Recent Labs    09/16/20 1636  BNP >4,500.0*    ProBNP (last 3 results) No results for input(s): PROBNP in the last 8760 hours.  CBG: Recent Labs  Lab 09/21/20 1009  GLUCAP 113*       Signed:  Cristal Ford  [facility] 09/25/2020, 5:03 PM

## 2020-09-25 NOTE — Progress Notes (Addendum)
Cardiology Progress Note  Patient ID: TAN CLOPPER MRN: [id number] DOB: February 15, 1946 Date of Encounter: 09/25/2020  Primary Cardiologist: Carlyle Dolly, MD  Subjective   Denies any chest pain this am.  Chest CTA yesterday with 4.2cm thoracic aortic aneurysm at the arch, 3.9cm at the diaphragm and 3cm at the infrarenal aorta.  Penetrating atherosclerotic ulcer noted along the undersurface of the aortic arch adjacent to the area of maximal dilation. This demonstrates interval decrease in size with increasing mural thrombus when compared to prior examination.   ROS:  All other ROS reviewed and negative. Pertinent positives noted in the HPI.     Inpatient Medications  Scheduled Meds: . allopurinol  100 mg Oral Daily  . amiodarone  400 mg Oral BID   Followed by  . [START ON 09/30/2020] amiodarone  200 mg Oral Daily  . apixaban  2.5 mg Oral BID  . atorvastatin  40 mg Oral Daily  . Chlorhexidine Gluconate Cloth  6 each Topical Q0600  . clopidogrel  75 mg Oral Q breakfast  . hydrALAZINE  10 mg Oral Q8H  . isosorbide mononitrate  15 mg Oral Daily  . levothyroxine  175 mcg Oral Q0600  . metoprolol succinate  25 mg Oral Daily  . pantoprazole  40 mg Oral Daily  . pramipexole  0.25 mg Oral QHS  . sodium chloride flush  3 mL Intravenous Q12H   Continuous Infusions: . sodium chloride    . sodium chloride    . sodium chloride     PRN Meds: sodium chloride, sodium chloride, sodium chloride, albuterol, heparin, heparin, lidocaine (PF), lidocaine-prilocaine, pentafluoroprop-tetrafluoroeth, prochlorperazine, sodium chloride flush   Vital Signs   Vitals:   09/25/20 1000 09/25/20 1015 09/25/20 1030 09/25/20 1100  BP: 102/61 106/64 105/67 100/61  Pulse: 73 72    Resp: (!) 21 (!) 22  (!) 23  Temp:   98 F (36.7 C)   TempSrc:   Oral   SpO2:  98%    Weight:  57.5 kg    Height:        Intake/Output Summary (Last 24 hours) at 09/25/2020 1142 Last data filed at 09/25/2020 1015  Gross per 24 hour  Intake 582 ml  Output 2500 ml  Net -1918 ml   Last 3 Weights 09/25/2020 09/25/2020 09/25/2020  Weight (lbs) 126 lb 12.2 oz 144 lb 2.9 oz 129 lb 10.1 oz  Weight (kg) 57.5 kg 65.4 kg 58.8 kg      Telemetry  Overnight telemetry shows NSR, which I personally reviewed.   ECG  NO new EKG to review  Physical Exam   Vitals:   09/25/20 1000 09/25/20 1015 09/25/20 1030 09/25/20 1100  BP: 102/61 106/64 105/67 100/61  Pulse: 73 72    Resp: (!) 21 (!) 22  (!) 23  Temp:   98 F (36.7 C)   TempSrc:   Oral   SpO2:  98%    Weight:  57.5 kg    Height:         Intake/Output Summary (Last 24 hours) at 09/25/2020 1142 Last data filed at 09/25/2020 1015 Gross per 24 hour  Intake 582 ml  Output 2500 ml  Net -1918 ml    Last 3 Weights 09/25/2020 09/25/2020 09/25/2020  Weight (lbs) 126 lb 12.2 oz 144 lb 2.9 oz 129 lb 10.1 oz  Weight (kg) 57.5 kg 65.4 kg 58.8 kg    Body mass index is 18.72 kg/m.   GEN: Well nourished, well developed in no acute  distress HEENT: Normal NECK: No JVD; No carotid bruits LYMPHATICS: No lymphadenopathy CARDIAC:RRR, no murmurs, rubs, gallops RESPIRATORY:  Clear to auscultation without rales, wheezing or rhonchi  ABDOMEN: Soft, non-tender, non-distended MUSCULOSKELETAL:  No edema; No deformity  SKIN: Warm and dry NEUROLOGIC:  Alert and oriented x 3 PSYCHIATRIC:  Normal affect    Labs  High Sensitivity Troponin:   Recent Labs  Lab 09/16/20 1649 09/16/20 1857  TROPONINIHS 79* 78*     Cardiac EnzymesNo results for input(s): TROPONINI in the last 168 hours. No results for input(s): TROPIPOC in the last 168 hours.  Chemistry Recent Labs  Lab 09/20/20 0719 09/20/20 1351 09/21/20 0757 09/21/20 0757 09/22/20 0826 09/23/20 0826 09/25/20 0200  NA  --    < > 132*   < > 135 136 132*  K  --    < > 5.8*   < > 4.2 4.1 3.6  CL  --    < > 91*   < > 96* 96* 95*  CO2  --    < > 23  --  26  --  24  GLUCOSE  --    < > 112*   < > 103* 109*  115*  BUN  --    < > 78*   < > 34* 53* 36*  CREATININE  --    < > 8.31*   < > 5.37* 7.40* 6.44*  CALCIUM  --    < > 8.8*  --  8.6*  --  7.9*  PROT 7.2  --   --   --  6.9  --  6.2*  ALBUMIN 2.8*  --   --   --  2.6*  --  2.5*  AST 553*  --   --   --  218*  --  60*  ALT 816*  --   --   --  506*  --  224*  ALKPHOS 132*  --   --   --  117  --  110  BILITOT 0.9  --   --   --  1.2  --  1.0  GFRNONAA  --    < > 6*  --  11*  --  9*  ANIONGAP  --    < > 18*  --  13  --  13   < > = values in this interval not displayed.    Hematology Recent Labs  Lab 09/21/20 0504 09/21/20 0504 09/23/20 0340 09/23/20 0826 09/25/20 0612  WBC 9.0  --  8.1  --  7.0  RBC 2.67*  --  2.49*  --  2.52*  HGB 8.6*   < > 8.1* 9.9* 8.3*  HCT 26.3*   < > 24.8* 29.0* 25.1*  MCV 98.5  --  99.6  --  99.6  MCH 32.2  --  32.5  --  32.9  MCHC 32.7  --  32.7  --  33.1  RDW 15.9*  --  17.0*  --  18.4*  PLT 108*  --  106*  --  122*   < > = values in this interval not displayed.   BNPNo results for input(s): BNP, PROBNP in the last 168 hours.  DDimer No results for input(s): DDIMER in the last 168 hours.   Radiology  CT Angio Chest/Abd/Pel for Dissection W and/or W/WO  Result Date: 09/24/2020 CLINICAL DATA:  Thoracic aortic dissection, penetrating atherosclerotic ulcer EXAM: CT ANGIOGRAPHY CHEST AND ABDOMEN TECHNIQUE: Non-contrast CT of the chest was initially obtained.  Multidetector CT imaging through the chest and abdomen was performed using the standard protocol during bolus administration of intravenous contrast. Multiplanar reconstructed images and MIPs were obtained and reviewed to evaluate the vascular anatomy. CONTRAST:  91mL OMNIPAQUE IOHEXOL 350 MG/ML SOLN COMPARISON:  CT chest 09/16/2020, PE protocol CTA chest 04/05/2020 FINDINGS: CTA CHEST FINDINGS Cardiovascular: The ascending aorta is of normal caliber measuring 3.7 cm in greatest dimension. As noted on prior CT arteriogram, there is aneurysmal dilation of the  aortic arch, with maximal transaxial dimensions of 4.2 x 4.2 cm on axial image # 13 and coronal reformat # 43. This is stable since prior examination and likely represents the long-term sequela of a remote penetrating atherosclerotic ulcer. There is an additional penetrating atherosclerotic ulcer demonstrating a diameter of approximately 9 mm seen along the undersurface of the aortic arch in the region of maximal dilation, best seen on axial image # 16, coronal reformat # 53, and sagittal reformat # 96. No transmural extension or dissection. No mediastinal hematoma. The thoracic aorta remains dilated, measuring 3.2 cm at the level of the left atrium with fusiform dilation at the level of the diaphragmatic hiatus of 3.5 x 3.9 cm on sagittal reformat # 75 and coronal reformat # 74. There is moderate global cardiomegaly with left ventricular dilation. Extensive multi-vessel coronary artery calcification. Left circumflex coronary artery stenting has been performed. No pericardial effusion. The central pulmonary arteries are enlarged in keeping with changes of pulmonary arterial hypertension. Mediastinum/Nodes: No pathologic thoracic adenopathy. The esophagus is fluid and debris-filled within its mid segment, suggesting changes of gastroesophageal reflux or esophageal dysmotility. Lungs/Pleura: Severe emphysema within the visualized lungs. Perihilar cicatricial changes within the left perihilar region are compatible with prior radiation therapy. There is resultant left-sided volume loss. Small left pleural effusion is again seen. There is interval development of focal consolidation within the left lower lobe, likely infectious or inflammatory in nature Musculoskeletal: No acute bone abnormality within the visualized thorax. No lytic or blastic bone lesion identified. Review of the MIP images confirms the above findings. CTA ABDOMEN AND PELVIS FINDINGS VASCULAR Aorta: There is fusiform dilation of the infrarenal abdominal  aorta measuring 3.0 cm in greatest dimension. Moderate superimposed mixed atherosclerotic plaque. No penetrating atherosclerotic ulcer. No dissection. Celiac: Widely patent.  Normal anatomic configuration. SMA: Focal dissection at the origin results in a 50% stenosis of the SMA in this region. Distally, the vessel is widely patent. Renals: Single left renal artery is heavily diseased throughout its course. Single right renal artery demonstrates scattered disease resulting in a roughly 50% stenosis of its mid segment. IMA: Occluded at its origin. Inflow: Largely excluded Veins: Morphologically normal Review of the MIP images confirms the above findings. NON-VASCULAR Hepatobiliary: No focal liver abnormality is seen. No gallstones, gallbladder wall thickening, or biliary dilatation. Pancreas: Unremarkable Spleen: Normal in size without focal abnormality. Adrenals/Urinary Tract: The adrenal glands are unremarkable. There is moderate left and mild right renal cortical atrophy. Multiple simple cortical cysts noted within the right kidney measuring up to 8.1 cm. Stomach/Bowel: Small supraumbilical ventral hernia contains a single loop of unremarkable transverse colon. The stomach and visualized bowel is otherwise unremarkable. Lymphatic: Shotty periaortic adenopathy is identified, stable from CT examination of 04/17/2019. No frankly pathologic adenopathy within the abdomen. Other: None significant Musculoskeletal: No acute bone abnormality within the abdomen. Review of the MIP images confirms the above findings. IMPRESSION: Thoracoabdominal aortic aneurysm. Maximal dimension of the thoracic aorta is 4.2 cm at the arch. Focal dilation at  the diaphragmatic hiatus with maximal dimension of 3.9 cm. Maximal dimension of the abdominal aorta is 3.0 cm in its infrarenal segment. Recommend semi-annual imaging followup by CTA or MRA and referral to cardiothoracic surgery if not already obtained. This recommendation follows 2010  ACCF/AHA/AATS/ACR/ASA/SCA/SCAI/SIR/STS/SVM Guidelines for the Diagnosis and Management of Patients With Thoracic Aortic Disease. Circulation. 2010; 121: I712-W58. Aortic aneurysm NOS (ICD10-I71.9) Penetrating atherosclerotic ulcer noted along the undersurface of the aortic arch adjacent to the area of maximal dilation. This demonstrates interval decrease in size with increasing mural thrombus when compared to prior examination. Given its dynamic nature, follow-up evaluation in 6 months is recommended in the asymptomatic patient. If stable, further evaluation can be performed with routine surveillance imaging of the a thoracic aortic aneurysm. Extensive coronary artery calcification. Moderate global cardiomegaly. Morphologic changes of pulmonary arterial hypertension. Severe emphysema. Post radiation changes within the left perihilar region, unchanged. New focal consolidation within the left lower lobe, likely infectious in the acute setting. Peripheral vascular disease. 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 Scattered disease within the right renal artery results in 50% stenosis within its mid segment. Aortic Atherosclerosis (ICD10-I70.0) and Emphysema (ICD10-J43.9). Aortic aneurysm NOS (ICD10-I71.9). Electronically Signed   By: Fidela Salisbury MD   On: 09/24/2020 23:56    Cardiac Studies  LHC 09/20/2020  Prox LAD to Mid LAD lesion is 40% stenosed.  Mid Cx lesion is 50% stenosed.  Mid Cx to Dist Cx lesion is 90% stenosed.  Prox Cx lesion is 50% stenosed.  RV Branch lesion is 50% stenosed.  A drug-eluting stent was successfully placed using a STENT RESOLUTE ONYX 3.0X34.  Post intervention, there is a 0% residual stenosis.  Post intervention, there is a 0% residual stenosis.  Post intervention, there is a 0% residual stenosis.  A drug-eluting stent was successfully placed using a STENT RESOLUTE ONYX 4.0X18.  LV end diastolic pressure is normal.  Hemodynamic  findings consistent with mild pulmonary hypertension.   1. Left dominant circulation 2. Severe single vessel obstructive CAD involving the proximal to mid LCx 3. Normal LV filling pressures 4. Mild pulmonary HTN 5. Normal cardiac output 6. Successful PCI of the proximal to mid LCx with DES x 2 overlapping. Lesion modified with cutting balloon angioplasty and Shockwave therapy.   Plan: DAPT for at least one year. Optimize medical therapy for CHF.   TTE 09/17/2020   1. Left ventricular ejection fraction, by estimation, is 15-20%. The left  ventricle has severely decreased function. The left ventricle demonstrates  global hypokinesis. The left ventricular internal cavity size was mildly  dilated. Left ventricular  diastolic parameters are consistent with Grade III diastolic dysfunction  (restrictive). Elevated left atrial pressure.  2. Right ventricular systolic function is mildly reduced. The right  ventricular size is normal.  3. Left atrial size was severely dilated.  4. Right atrial size was severely dilated.  5. Large pleural effusion in the left lateral region.  6. The mitral valve is normal in structure. Mild mitral valve  regurgitation. No evidence of mitral stenosis.  7. The aortic valve is tricuspid. Aortic valve regurgitation is not  visualized. No aortic stenosis is present.  8. The inferior vena cava is dilated in size with >50% respiratory  variability, suggesting right atrial pressure of 8 mmHg.    Patient Profile  74 y.o.malewith ahistory of CAD with prior stent to LCX in 1997, hypertension, hyperlipidemia, hypothyroidism, ESRDon dialysis T/Th/Sat, COPD, and lung cancer who Cardiology was asked  to see for acute systolic CHF with EF of 61-44%. Patient was transferred to [facility] on 09/20/2020 for right/left heart catheterization.  Assessment & Plan   1.  New onset systolic heart failure, EF 15-20% -Transferred from [facility] with new onset  cardiomyopathy.  Underwent left heart catheterization and found to have single-vessel disease in the circumflex and is status post PCI. -TSH normal. -No alcohol abuse reported. -HIV screen negative in 2020 -He did receive chemotherapy but did not receive anthracyclines in the past -Was found to be in atrial fibrillation/flutter -Cardiomyopathy out of proportion to CAD.   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  -remains in NSR -continue Amio PO 400 mg twice daily for 7 days and then 200 mg daily thereafter.  He is a poor candidate for EP procedures given ESRD. -continue metoprolol succinate 25 mg daily and hydralazine 10 mg 3 times daily and Imdur 15 mg daily.  -Volume control managed by dialysis.  2.  Atrial flutter status post TEE/cardioversion -remains in NSR -continue Amio PO 400 mg twice daily for 7 days and then 200 mg daily thereafter.  He is a poor candidate for EP procedures given ESRD. -continue Eliquis 5 mg twice daily.  3.  CAD status post PCI to the left circumflex -Underwent left heart catheterization and found to have single-vessel disease in the circumflex and is status post PCI. -Initial plan was for triple therapy for 1 month.  Did have some bleeding.  Has known hemorrhoids as well as some diverticulosis.  -Now on Plavix and Eliquis which he will continue this for 1 year.  After 1 year he can go back to aspirin. -continue  beta-blocker and high intensity statin.  4.  Penetrating aortic ulcer? -Concern for this on TEE.   -CTA ordered showing penetrating atherosclerotic ulcer noted along the undersurface of the aortic arch adjacent to the area of maximal dilation. This demonstrates interval decrease in size with increasing mural thrombus when compared to prior examination.  -will get CVTS input - sees Servando Snare for his aneurysm  He really wants to go home today.  From cardiac standpoint he is stable.  Will see if CVTS has anything to add prior to discharge.    Time Spent with Patient: I have spent a total of 35 minutes with patient reviewing hospital notes, telemetry, EKGs, labs and examining the patient as well as establishing an assessment and plan that was discussed with the patient.  > 50% of time was spent in direct patient care.    Signed, Fransico Him, MD [place]  09/25/2020 11:42 AM

## 2020-09-26 ENCOUNTER — Encounter (HOSPITAL_COMMUNITY): Payer: Self-pay | Admitting: Cardiovascular Disease

## 2020-09-26 ENCOUNTER — Telehealth: Payer: Self-pay | Admitting: Nephrology

## 2020-09-26 NOTE — Telephone Encounter (Signed)
Transition of Care Contact from [place]   Date of Discharge: 09/25/20 Date of Contact: 09/26/20 Method of contact: phone Talked to patient   Patient contacted to discuss transition of care form recent hospitaliztion. Patient was admitted to [facility] from 11/4 to 11/13 with the discharge diagnosis of acute systolic HF, CAD and new A flutter.     Medication changes were reviewed.   Patient will follow up with is outpatient dialysis center 09/28/20.   Other follow up needs include to schedule cardiac rehab.     Jen Mow, PA-C [facility] Pager: [phone]

## 2020-09-28 DIAGNOSIS — N186 End stage renal disease: Secondary | ICD-10-CM | POA: Diagnosis not present

## 2020-09-28 DIAGNOSIS — N2581 Secondary hyperparathyroidism of renal origin: Secondary | ICD-10-CM | POA: Diagnosis not present

## 2020-09-28 DIAGNOSIS — Z992 Dependence on renal dialysis: Secondary | ICD-10-CM | POA: Diagnosis not present

## 2020-09-28 DIAGNOSIS — D689 Coagulation defect, unspecified: Secondary | ICD-10-CM | POA: Diagnosis not present

## 2020-09-29 ENCOUNTER — Encounter: Payer: Self-pay | Admitting: *Deleted

## 2020-09-29 ENCOUNTER — Other Ambulatory Visit: Payer: Self-pay

## 2020-09-29 ENCOUNTER — Ambulatory Visit (INDEPENDENT_AMBULATORY_CARE_PROVIDER_SITE_OTHER): Payer: Medicare Other | Admitting: Gastroenterology

## 2020-09-29 ENCOUNTER — Encounter: Payer: Self-pay | Admitting: Internal Medicine

## 2020-09-29 ENCOUNTER — Encounter: Payer: Self-pay | Admitting: Gastroenterology

## 2020-09-29 VITALS — BP 100/53 | HR 74 | Temp 97.1°F | Ht 69.0 in | Wt 140.2 lb

## 2020-09-29 DIAGNOSIS — N189 Chronic kidney disease, unspecified: Secondary | ICD-10-CM | POA: Diagnosis not present

## 2020-09-29 DIAGNOSIS — D631 Anemia in chronic kidney disease: Secondary | ICD-10-CM

## 2020-09-29 DIAGNOSIS — R7989 Other specified abnormal findings of blood chemistry: Secondary | ICD-10-CM | POA: Insufficient documentation

## 2020-09-29 DIAGNOSIS — R634 Abnormal weight loss: Secondary | ICD-10-CM

## 2020-09-29 DIAGNOSIS — R1310 Dysphagia, unspecified: Secondary | ICD-10-CM | POA: Insufficient documentation

## 2020-09-29 NOTE — Patient Instructions (Signed)
Let's decrease Protonix to once per day, 30 minutes before breakfast on an empty stomach. If you have worsening reflux, then increase back to twice per day.   Please have blood work done next Monday to recheck your liver numbers.  I have ordered an xray of your esophagus.  We will see you back in January 2022. Keep upcoming appointments with the other specialities that are already arranged.   I enjoyed seeing you again today! As you know, I value our relationship and want to provide genuine, compassionate, and quality care. I welcome your feedback. If you receive a survey regarding your visit,  I greatly appreciate you taking time to fill this out. See you next time!  Annitta Needs, PhD, ANP-BC [facility]

## 2020-09-29 NOTE — Progress Notes (Signed)
Referring Provider: [name], MD Primary Care Physician:  [name], MD Primary GI: Dr. Abbey Chatters  Chief Complaint  Patient presents with  . loss of weight    f/u    HPI:   Jeremy Johnson is a 74 y.o. male presenting today with a history of small cell lung cancer diagnosed in March 2020, undergoing 4 cycles of chemo and currently on observation status, weight loss over the past year (additional 14 lbs since last visit in July 2021), undergoing colonoscopy and EGD in interim from last appt. Due to adenomas, 3-year-surveillance in 2024 is due. EGD without H.pylori. Due to worsening anemia, iron studies drawn which shows iron 71, ferritin elevated, no elevated sats. Anemia of CKD, on dialysis.   Inpatient recently with acute HF exacerabation/CAD, newly diagnosed aflutter with TEE cardioversion, cardiac cath on 11/8 with stent placement elevated LFTs felt related to hepatic congestion/ischemia , Recent CT angio chest/abd/pelvis with thoracoabdominal aortic aneursym, further findings as noted in epic. IMA occluded at origin, SMA with focal dissection at origin resulting in 50 % stenosis of SMA in this region with distal vessel widely patent, celiac widely patent.    No abdominal pain with eating. Appetite is coming back. Some days not eating as much. Eating better than he was at time of colonoscopy. Feels like food getting hung up in esophagus. Difficulty sleeping at night. States his taste went away with chemo and radiation. Can taste only extremes: very salty or very sweet. Taste is getting better slowly.   Vascular appt in Dec Dr. Willey Blade next week     Past Medical History:  Diagnosis Date  . Anemia   . Blood transfusion without reported diagnosis   . CAD (coronary artery disease)    STENT... MID CIRCUMFLEX...1997  . Chronic kidney disease    STAGE 3  . COPD (chronic obstructive pulmonary disease) ([place])   . Degenerative joint disease (DJD) of lumbar spine   . GERD  (gastroesophageal reflux disease)   . Gout   . Hyperlipidemia   . Hypertension   . Hypothyroidism   . Incisional hernia    abdomen  . Leukocytosis    CHRONIC MILD  . Myocardial infarction ([place])    1997  . SCL CA dx'd 01/2019   Lung cancer    Past Surgical History:  Procedure Laterality Date  . ABDOMINAL AORTIC ANEURYSM REPAIR  2006  . AV FISTULA PLACEMENT Left 04/25/2019   Procedure: ARTERIOVENOUS (AV) FISTULA CREATION LEFT ARM;  Surgeon: Angelia Mould, MD;  Location: [facility];  Service: Vascular;  Laterality: Left;  . AV FISTULA PLACEMENT Left 05/19/2019   Procedure: CONVERSION OF LEFT ARM ARTERIOVENOUS FISTULA TO GRAFT;  Surgeon: Angelia Mould, MD;  Location: [facility];  Service: Vascular;  Laterality: Left;  . BIOPSY  09/30/2018   Procedure: BIOPSY;  Surgeon: Danie Binder, MD;  [facility];  Service: Endoscopy;;  ascending colon  . BIOPSY  07/27/2020   Procedure: BIOPSY;  Surgeon: Eloise Harman, DO;  [facility];  Service: Endoscopy;;  gastric  . BUBBLE STUDY  09/23/2020   Procedure: BUBBLE STUDY;  Surgeon: Skeet Latch, MD;  Location: [facility];  Service: Cardiovascular;;  . CARDIOVERSION N/A 09/23/2020   Procedure: CARDIOVERSION;  Surgeon: Skeet Latch, MD;  Location: [facility];  Service: Cardiovascular;  Laterality: N/A;  . COLONOSCOPY  2008  . COLONOSCOPY N/A 09/30/2018   External and internal hemorrhoids, six polyps removed, one ascending colon polypoid  lesion biopsied. Six simple adenomas and one benign polypoid lesion. Colonoscopy Nov 2022.   [name] COLONOSCOPY WITH PROPOFOL N/A 07/27/2020   non-bleeding internal hemorrhoids, sigmoid and descending colon diverticulosis, four 1-2 mm polyps in ascending colon, one 5 mm polyp in transverse colon. 3 year surveillance. Tubular adenomas.   . CORONARY ANGIOPLASTY WITH STENT PLACEMENT  1997   MID CIRCUMFLEX  . CORONARY STENT INTERVENTION N/A 09/20/2020   Procedure: CORONARY STENT  INTERVENTION;  Surgeon: Martinique, Peter M, MD;  Location: [facility];  Service: Cardiovascular;  Laterality: N/A;  . ESOPHAGOGASTRODUODENOSCOPY (EGD) WITH PROPOFOL N/A 07/27/2020   Food in middle third of esophagus, gastritis s/p biopsy, nodular mucosa in lesser curvature of stomach s/p biopsy. Negative H.pylori.   [name] HIP ARTHROPLASTY Right 04/30/2020   Procedure: ARTHROPLASTY  HIP (HEMIARTHROPLASTY);  Surgeon: Altamese Venus, MD;  Location: [facility];  Service: Orthopedics;  Laterality: Right;  . INTRAVASCULAR ULTRASOUND/IVUS N/A 09/20/2020   Procedure: Intravascular Ultrasound/IVUS;  Surgeon: Martinique, Peter M, MD;  Location: [facility];  Service: Cardiovascular;  Laterality: N/A;  . IR FLUORO GUIDE CV LINE RIGHT  04/22/2019  . IR FLUORO GUIDE CV LINE RIGHT  05/01/2020  . IR THORACENTESIS ASP PLEURAL SPACE W/IMG GUIDE  09/22/2020  . IR THROMBECTOMY AV FISTULA W/THROMBOLYSIS/PTA INC/SHUNT/IMG LEFT Left 05/03/2020  . IR US GUIDE VASC ACCESS LEFT  05/03/2020  . IR US GUIDE VASC ACCESS RIGHT  04/22/2019  . IR US GUIDE VASC ACCESS RIGHT  05/01/2020  . POLYPECTOMY  09/30/2018   Procedure: POLYPECTOMY;  Surgeon: Danie Binder, MD;  [facility];  Service: Endoscopy;;  colon  . POLYPECTOMY  07/27/2020   Procedure: POLYPECTOMY;  Surgeon: Eloise Harman, DO;  [facility];  Service: Endoscopy;;  . RIGHT/LEFT HEART CATH AND CORONARY ANGIOGRAPHY N/A 09/20/2020   Procedure: RIGHT/LEFT HEART CATH AND CORONARY ANGIOGRAPHY;  Surgeon: Martinique, Peter M, MD;  Location: [facility];  Service: Cardiovascular;  Laterality: N/A;  . TEE WITHOUT CARDIOVERSION N/A 09/23/2020   Procedure: TRANSESOPHAGEAL ECHOCARDIOGRAM (TEE);  Surgeon: Skeet Latch, MD;  Location: [facility];  Service: Cardiovascular;  Laterality: N/A;  . VIDEO BRONCHOSCOPY WITH ENDOBRONCHIAL NAVIGATION N/A 01/27/2019   Procedure: VIDEO BRONCHOSCOPY WITH ENDOBRONCHIAL NAVIGATION;  Surgeon: Grace Isaac, MD;   Location: [facility];  Service: Thoracic;  Laterality: N/A;  . VIDEO BRONCHOSCOPY WITH ENDOBRONCHIAL ULTRASOUND N/A 01/27/2019   Procedure: VIDEO BRONCHOSCOPY WITH ENDOBRONCHIAL ULTRASOUND;  Surgeon: Grace Isaac, MD;  Location: [facility];  Service: Thoracic;  Laterality: N/A;    Current Outpatient Medications  Medication Sig Dispense Refill  . acetaminophen (TYLENOL) 325 MG tablet Take 2 tablets (650 mg total) by mouth every 6 (six) hours as needed for mild pain (or Fever >/= 101). (Patient taking differently: Take 1,300 mg by mouth every 6 (six) hours as needed for mild pain or headache (or Fever >/= 101). Arthritis strength) 30 tablet 0  . albuterol (VENTOLIN HFA) 108 (90 Base) MCG/ACT inhaler Inhale 2 puffs into the lungs every 6 (six) hours as needed for wheezing or shortness of breath.     . allopurinol (ZYLOPRIM) 100 MG tablet Take 1 tablet (100 mg total) by mouth daily. (Patient taking differently: Take 100 mg by mouth in the morning. ) 30 tablet 1  . amiodarone (PACERONE) 200 MG tablet Take 2 tablets (400 mg total) by mouth 2 (two) times daily for 4 days, THEN 1 tablet (200 mg total) daily. 46 tablet 0  . apixaban (  ELIQUIS) 2.5 MG TABS tablet Take 1 tablet (2.5 mg total) by mouth 2 (two) times daily. 60 tablet 1  . atorvastatin (LIPITOR) 40 MG tablet Take 1 tablet (40 mg total) by mouth daily. 30 tablet 1  . B Complex-C-Zn-Folic Acid (DIALYVITE 323-FTDD 15) 0.8 MG TABS Take 1 tablet by mouth daily.    . clopidogrel (PLAVIX) 75 MG tablet Take 1 tablet (75 mg total) by mouth daily with breakfast. 30 tablet 1  . Darbepoetin Alfa (ARANESP) 100 MCG/0.5ML SOSY injection Inject 0.5 mLs (100 mcg total) into the vein every Thursday with hemodialysis. 4.2 mL   . doxycycline (VIBRA-TABS) 100 MG tablet Take 100 mg by mouth 2 (two) times daily.    . iron sucrose in sodium chloride 0.9 % 100 mL 50 mg.    . isosorbide mononitrate (IMDUR) 30 MG 24 hr tablet Take 0.5 tablets (15 mg total) by mouth daily. 30  tablet 1  . levothyroxine (SYNTHROID, LEVOTHROID) 175 MCG tablet Take 175 mcg by mouth daily before breakfast.     . lidocaine-prilocaine (EMLA) cream SMARTSIG:1 Topical Every Night    . Methoxy PEG-Epoetin Beta (MIRCERA IJ) Inject 30 mg into the vein every 28 (twenty-eight) days.     . metoprolol succinate (TOPROL-XL) 25 MG 24 hr tablet Take 1 tablet (25 mg total) by mouth daily. 30 tablet 1  . omeprazole (PRILOSEC) 40 MG capsule Take 1 capsule (40 mg total) by mouth in the morning and at bedtime. 60 capsule 5  . Cyanocobalamin (VITAMIN B 12) 500 MCG TABS Take 1,000 mcg by mouth daily. (Patient not taking: Reported on 09/29/2020) 30 tablet   . pramipexole (MIRAPEX) 0.25 MG tablet Take 0.25 mg by mouth at bedtime. (Patient not taking: Reported on 09/16/2020)     No current facility-administered medications for this visit.    Allergies as of 09/29/2020 - Review Complete 09/29/2020  Allergen Reaction Noted  . Advair hfa [fluticasone-salmeterol] Other (See Comments) 04/29/2020  . Penicillins Rash 02/09/2010    Family History  Problem Relation Age of Onset  . Stroke Brother   . Lung cancer Sister 37       lung cancer/former  . Colon cancer Neg Hx   . Colon polyps Neg Hx     Social History   Socioeconomic History  . Marital status: Married    Spouse name: Not on file  . Number of children: 2  . Years of education: Not on file  . Highest education level: Not on file  Occupational History    Comment: retired  Tobacco Use  . Smoking status: Former Smoker    Packs/day: 0.50    Years: 54.00    Pack years: 27.00    Quit date: 09/17/2017    Years since quitting: 3.0  . Smokeless tobacco: Never Used  Vaping Use  . Vaping Use: Never used  Substance and Sexual Activity  . Alcohol use: Not Currently    Comment: occasional  . Drug use: No  . Sexual activity: Not Currently  Other Topics Concern  . Not on file  Social History Narrative  . Not on file   Social Determinants of  Health   Financial Resource Strain:   . Difficulty of Paying Living Expenses: Not on file  Food Insecurity:   . Worried About Charity fundraiser in the Last Year: Not on file  . Ran Out of Food in the Last Year: Not on file  Transportation Needs:   . Lack of Transportation (Medical): Not  on file  . Lack of Transportation (Non-Medical): Not on file  Physical Activity:   . Days of Exercise per Week: Not on file  . Minutes of Exercise per Session: Not on file  Stress:   . Feeling of Stress : Not on file  Social Connections:   . Frequency of Communication with Friends and Family: Not on file  . Frequency of Social Gatherings with Friends and Family: Not on file  . Attends Religious Services: Not on file  . Active Member of Clubs or Organizations: Not on file  . Attends Archivist Meetings: Not on file  . Marital Status: Not on file    Review of Systems: Gen: Denies fever, chills, anorexia. Denies fatigue, weakness, weight loss.  CV: Denies chest pain, palpitations, syncope, peripheral edema, and claudication. Resp: Denies dyspnea at rest, cough, wheezing, coughing up blood, and pleurisy. GI: see HPI Derm: Denies rash, itching, dry skin Psych: Denies depression, anxiety, memory loss, confusion. No homicidal or suicidal ideation.  Heme: Denies bruising, bleeding, and enlarged lymph nodes.  Physical Exam: BP (!) 100/53   Pulse 74   Temp (!) 97.1 F (36.2 C) (Temporal)   Ht 5\' 9"  (1.753 m)   Wt 140 lb 3.2 oz (63.6 kg)   BMI 20.70 kg/m  General:   Alert and oriented. No distress noted. Frail, thin.  Head:  Normocephalic and atraumatic. Eyes:  Conjuctiva clear without scleral icterus. Mouth:  Mask in place Abdomen:  +BS, soft, non-tender and non-distended. No rebound or guarding. Large ventral hernia midline.  Msk:  With kyphosis. Cane for ambulation Extremities:  Without edema. Neurologic:  Alert and  oriented x4 Psych:  Alert and cooperative. Normal mood and  affect.  ASSESSMENT: Jeremy Johnson is a 74 y.o. male presenting today with numerous comorbidities, recently inpatient with acute HF exacerabation/CAD, newly diagnosed aflutter with TEE cardioversion, cardiac cath on 11/8 with stent placement, elevated LFTs felt related to hepatic congestion/ischemia, here for follow-up after colonoscopy/EGD in interim from last appointment.  Persistent weight loss noted; he has no postprandial abdominal pain or fear of eating. CTA chest/abd/pelvis on file, and he does have IMA occluded at origin, SMA with focal dissection at origin resulting in 50 % stenosis of SMA in this region with distal vessel widely patent, and celiac widely patent. Thoracoabdominal aortic aneurysm, penetrating atherosclerotic ulcer, and Vascular appt upcoming. At this point, does not seem to have chronic mesenteric ischemia. TSH normal. No obvious source to contribute to weight loss on EGD/colonoscopy.   He does have persistent solid food dysphagia, which I suspect is related to underlying motility disorder. Loss of taste/smell following chemo is slowly improvingand contributing to decreased desire for food. Difficulty swallowing also contributing. BPE ordered.  Elevated LFTs: in setting of congestion/ischemia. Will recheck Monday. Labs provided to patient.     PLAN:  Keep upcoming appts with Vascular, PCP, Oncology  HFP next week  BPE  Close follow-up with Dr. Abbey Chatters in January 2022  Annitta Needs, PhD, ANP-BC [facility]

## 2020-09-30 DIAGNOSIS — Z992 Dependence on renal dialysis: Secondary | ICD-10-CM | POA: Diagnosis not present

## 2020-09-30 DIAGNOSIS — I129 Hypertensive chronic kidney disease with stage 1 through stage 4 chronic kidney disease, or unspecified chronic kidney disease: Secondary | ICD-10-CM | POA: Diagnosis not present

## 2020-09-30 DIAGNOSIS — I132 Hypertensive heart and chronic kidney disease with heart failure and with stage 5 chronic kidney disease, or end stage renal disease: Secondary | ICD-10-CM | POA: Diagnosis not present

## 2020-09-30 DIAGNOSIS — I509 Heart failure, unspecified: Secondary | ICD-10-CM | POA: Diagnosis not present

## 2020-09-30 DIAGNOSIS — D689 Coagulation defect, unspecified: Secondary | ICD-10-CM | POA: Diagnosis not present

## 2020-09-30 DIAGNOSIS — I2510 Atherosclerotic heart disease of native coronary artery without angina pectoris: Secondary | ICD-10-CM | POA: Diagnosis not present

## 2020-09-30 DIAGNOSIS — N186 End stage renal disease: Secondary | ICD-10-CM | POA: Diagnosis not present

## 2020-09-30 DIAGNOSIS — N2581 Secondary hyperparathyroidism of renal origin: Secondary | ICD-10-CM | POA: Diagnosis not present

## 2020-10-02 DIAGNOSIS — N2581 Secondary hyperparathyroidism of renal origin: Secondary | ICD-10-CM | POA: Diagnosis not present

## 2020-10-02 DIAGNOSIS — Z992 Dependence on renal dialysis: Secondary | ICD-10-CM | POA: Diagnosis not present

## 2020-10-02 DIAGNOSIS — J449 Chronic obstructive pulmonary disease, unspecified: Secondary | ICD-10-CM | POA: Diagnosis not present

## 2020-10-02 DIAGNOSIS — D689 Coagulation defect, unspecified: Secondary | ICD-10-CM | POA: Diagnosis not present

## 2020-10-02 DIAGNOSIS — N186 End stage renal disease: Secondary | ICD-10-CM | POA: Diagnosis not present

## 2020-10-04 ENCOUNTER — Other Ambulatory Visit: Payer: Self-pay

## 2020-10-04 ENCOUNTER — Ambulatory Visit (INDEPENDENT_AMBULATORY_CARE_PROVIDER_SITE_OTHER): Payer: Medicare Other | Admitting: Physician Assistant

## 2020-10-04 ENCOUNTER — Encounter: Payer: Self-pay | Admitting: Physician Assistant

## 2020-10-04 VITALS — BP 82/42 | HR 70 | Ht 69.0 in | Wt 131.8 lb

## 2020-10-04 DIAGNOSIS — N186 End stage renal disease: Secondary | ICD-10-CM | POA: Diagnosis not present

## 2020-10-04 DIAGNOSIS — C3490 Malignant neoplasm of unspecified part of unspecified bronchus or lung: Secondary | ICD-10-CM

## 2020-10-04 DIAGNOSIS — I712 Thoracic aortic aneurysm, without rupture, unspecified: Secondary | ICD-10-CM

## 2020-10-04 DIAGNOSIS — I2510 Atherosclerotic heart disease of native coronary artery without angina pectoris: Secondary | ICD-10-CM

## 2020-10-04 DIAGNOSIS — I10 Essential (primary) hypertension: Secondary | ICD-10-CM

## 2020-10-04 DIAGNOSIS — I4892 Unspecified atrial flutter: Secondary | ICD-10-CM | POA: Diagnosis not present

## 2020-10-04 DIAGNOSIS — I428 Other cardiomyopathies: Secondary | ICD-10-CM | POA: Diagnosis not present

## 2020-10-04 DIAGNOSIS — E782 Mixed hyperlipidemia: Secondary | ICD-10-CM

## 2020-10-04 DIAGNOSIS — Z992 Dependence on renal dialysis: Secondary | ICD-10-CM | POA: Diagnosis not present

## 2020-10-04 DIAGNOSIS — D689 Coagulation defect, unspecified: Secondary | ICD-10-CM | POA: Diagnosis not present

## 2020-10-04 DIAGNOSIS — N2581 Secondary hyperparathyroidism of renal origin: Secondary | ICD-10-CM | POA: Diagnosis not present

## 2020-10-04 NOTE — Patient Instructions (Signed)
Medication Instructions:  STOP IMDUR    *If you need a refill on your cardiac medications before your next appointment, please call your pharmacy*   Lab Work: None today If you have labs (blood work) drawn today and your tests are completely normal, you will receive your results only by: [name] MyChart Message (if you have MyChart) OR . A paper copy in the mail If you have any lab test that is abnormal or we need to change your treatment, we will call you to review the results.   Testing/Procedures:Your physician has requested that you have an echocardiogram in 3 months. Echocardiography is a painless test that uses sound waves to create images of your heart. It provides your doctor with information about the size and shape of your heart and how well your heart's chambers and valves are working. This procedure takes approximately one hour. There are no restrictions for this procedure.    Follow-Up: At [facility], you and your health needs are our priority.  As part of our continuing mission to provide you with exceptional heart care, we have created designated Provider Care Teams.  These Care Teams include your primary Cardiologist (physician) and Advanced Practice Providers (APPs -  Physician Assistants and Nurse Practitioners) who all work together to provide you with the care you need, when you need it.  We recommend signing up for the patient portal called "MyChart".  Sign up information is provided on this After Visit Summary.  MyChart is used to connect with patients for Virtual Visits (Telemedicine).  Patients are able to view lab/test results, encounter notes, upcoming appointments, etc.  Non-urgent messages can be sent to your provider as well.   To learn more about what you can do with MyChart, go to [email or web address].    Your next appointment:   3 month(s)  The format for your next appointment:   In Person  Provider:   Carlyle Dolly, MD   Other  Instructions None      Thank you for choosing [facility] !

## 2020-10-05 ENCOUNTER — Ambulatory Visit (HOSPITAL_COMMUNITY)
Admission: RE | Admit: 2020-10-05 | Discharge: 2020-10-05 | Disposition: A | Discharge status: Home / Self Care | Payer: Medicare Other | Source: Ambulatory Visit | Attending: Gastroenterology | Admitting: Gastroenterology

## 2020-10-05 DIAGNOSIS — Z9861 Coronary angioplasty status: Secondary | ICD-10-CM | POA: Diagnosis not present

## 2020-10-05 DIAGNOSIS — R1310 Dysphagia, unspecified: Secondary | ICD-10-CM | POA: Diagnosis not present

## 2020-10-05 DIAGNOSIS — K224 Dyskinesia of esophagus: Secondary | ICD-10-CM | POA: Diagnosis not present

## 2020-10-05 DIAGNOSIS — I5021 Acute systolic (congestive) heart failure: Secondary | ICD-10-CM | POA: Diagnosis not present

## 2020-10-05 DIAGNOSIS — I483 Typical atrial flutter: Secondary | ICD-10-CM | POA: Diagnosis not present

## 2020-10-05 DIAGNOSIS — R7989 Other specified abnormal findings of blood chemistry: Secondary | ICD-10-CM | POA: Diagnosis not present

## 2020-10-05 LAB — HEPATIC FUNCTION PANEL
AG Ratio: 0.9 (calc) — ABNORMAL LOW (ref 1.0–2.5)
ALT: 51 U/L — ABNORMAL HIGH (ref 9–46)
AST: 37 U/L — ABNORMAL HIGH (ref 10–35)
Albumin: 3.9 g/dL (ref 3.6–5.1)
Alkaline phosphatase (APISO): 150 U/L — ABNORMAL HIGH (ref 35–144)
Bilirubin, Direct: 0.2 mg/dL (ref 0.0–0.2)
Globulin: 4.2 g/dL (calc) — ABNORMAL HIGH (ref 1.9–3.7)
Indirect Bilirubin: 0.5 mg/dL (calc) (ref 0.2–1.2)
Total Bilirubin: 0.7 mg/dL (ref 0.2–1.2)
Total Protein: 8.1 g/dL (ref 6.1–8.1)

## 2020-10-06 DIAGNOSIS — D689 Coagulation defect, unspecified: Secondary | ICD-10-CM | POA: Diagnosis not present

## 2020-10-06 DIAGNOSIS — N2581 Secondary hyperparathyroidism of renal origin: Secondary | ICD-10-CM | POA: Diagnosis not present

## 2020-10-06 DIAGNOSIS — Z992 Dependence on renal dialysis: Secondary | ICD-10-CM | POA: Diagnosis not present

## 2020-10-06 DIAGNOSIS — N186 End stage renal disease: Secondary | ICD-10-CM | POA: Diagnosis not present

## 2020-10-09 DIAGNOSIS — D689 Coagulation defect, unspecified: Secondary | ICD-10-CM | POA: Diagnosis not present

## 2020-10-09 DIAGNOSIS — N186 End stage renal disease: Secondary | ICD-10-CM | POA: Diagnosis not present

## 2020-10-09 DIAGNOSIS — N2581 Secondary hyperparathyroidism of renal origin: Secondary | ICD-10-CM | POA: Diagnosis not present

## 2020-10-09 DIAGNOSIS — Z992 Dependence on renal dialysis: Secondary | ICD-10-CM | POA: Diagnosis not present

## 2020-10-12 ENCOUNTER — Other Ambulatory Visit: Payer: Self-pay

## 2020-10-12 ENCOUNTER — Telehealth: Payer: Self-pay | Admitting: Internal Medicine

## 2020-10-12 DIAGNOSIS — D689 Coagulation defect, unspecified: Secondary | ICD-10-CM | POA: Diagnosis not present

## 2020-10-12 DIAGNOSIS — N2581 Secondary hyperparathyroidism of renal origin: Secondary | ICD-10-CM | POA: Diagnosis not present

## 2020-10-12 DIAGNOSIS — I129 Hypertensive chronic kidney disease with stage 1 through stage 4 chronic kidney disease, or unspecified chronic kidney disease: Secondary | ICD-10-CM | POA: Diagnosis not present

## 2020-10-12 DIAGNOSIS — R7989 Other specified abnormal findings of blood chemistry: Secondary | ICD-10-CM

## 2020-10-12 DIAGNOSIS — N186 End stage renal disease: Secondary | ICD-10-CM | POA: Diagnosis not present

## 2020-10-12 DIAGNOSIS — Z992 Dependence on renal dialysis: Secondary | ICD-10-CM | POA: Diagnosis not present

## 2020-10-12 NOTE — Telephone Encounter (Signed)
Pt returning call regarding his labs. [phone]

## 2020-10-12 NOTE — Telephone Encounter (Signed)
See result note.  

## 2020-10-14 DIAGNOSIS — N2581 Secondary hyperparathyroidism of renal origin: Secondary | ICD-10-CM | POA: Diagnosis not present

## 2020-10-14 DIAGNOSIS — Z992 Dependence on renal dialysis: Secondary | ICD-10-CM | POA: Diagnosis not present

## 2020-10-14 DIAGNOSIS — N186 End stage renal disease: Secondary | ICD-10-CM | POA: Diagnosis not present

## 2020-10-14 DIAGNOSIS — D689 Coagulation defect, unspecified: Secondary | ICD-10-CM | POA: Diagnosis not present

## 2020-10-16 DIAGNOSIS — N186 End stage renal disease: Secondary | ICD-10-CM | POA: Diagnosis not present

## 2020-10-16 DIAGNOSIS — Z992 Dependence on renal dialysis: Secondary | ICD-10-CM | POA: Diagnosis not present

## 2020-10-16 DIAGNOSIS — D689 Coagulation defect, unspecified: Secondary | ICD-10-CM | POA: Diagnosis not present

## 2020-10-16 DIAGNOSIS — N2581 Secondary hyperparathyroidism of renal origin: Secondary | ICD-10-CM | POA: Diagnosis not present

## 2020-10-19 DIAGNOSIS — Z992 Dependence on renal dialysis: Secondary | ICD-10-CM | POA: Diagnosis not present

## 2020-10-19 DIAGNOSIS — N2581 Secondary hyperparathyroidism of renal origin: Secondary | ICD-10-CM | POA: Diagnosis not present

## 2020-10-19 DIAGNOSIS — D689 Coagulation defect, unspecified: Secondary | ICD-10-CM | POA: Diagnosis not present

## 2020-10-19 DIAGNOSIS — N186 End stage renal disease: Secondary | ICD-10-CM | POA: Diagnosis not present

## 2020-10-20 DIAGNOSIS — R7989 Other specified abnormal findings of blood chemistry: Secondary | ICD-10-CM | POA: Diagnosis not present

## 2020-10-21 ENCOUNTER — Telehealth: Payer: Self-pay | Admitting: Internal Medicine

## 2020-10-21 ENCOUNTER — Other Ambulatory Visit: Payer: Self-pay

## 2020-10-21 ENCOUNTER — Institutional Professional Consult (permissible substitution) (INDEPENDENT_AMBULATORY_CARE_PROVIDER_SITE_OTHER): Payer: Medicare Other | Admitting: Cardiothoracic Surgery

## 2020-10-21 VITALS — BP 99/61 | HR 72 | Temp 97.6°F | Resp 20 | Ht 69.0 in | Wt 132.0 lb

## 2020-10-21 DIAGNOSIS — N186 End stage renal disease: Secondary | ICD-10-CM | POA: Diagnosis not present

## 2020-10-21 DIAGNOSIS — Z992 Dependence on renal dialysis: Secondary | ICD-10-CM | POA: Diagnosis not present

## 2020-10-21 DIAGNOSIS — I712 Thoracic aortic aneurysm, without rupture, unspecified: Secondary | ICD-10-CM

## 2020-10-21 DIAGNOSIS — R7989 Other specified abnormal findings of blood chemistry: Secondary | ICD-10-CM

## 2020-10-21 DIAGNOSIS — N2581 Secondary hyperparathyroidism of renal origin: Secondary | ICD-10-CM | POA: Diagnosis not present

## 2020-10-21 DIAGNOSIS — D689 Coagulation defect, unspecified: Secondary | ICD-10-CM | POA: Diagnosis not present

## 2020-10-21 LAB — HEPATIC FUNCTION PANEL
ALT: 26 IU/L (ref 0–44)
AST: 30 IU/L (ref 0–40)
Albumin: 3.6 g/dL — ABNORMAL LOW (ref 3.7–4.7)
Alkaline Phosphatase: 131 IU/L — ABNORMAL HIGH (ref 44–121)
Bilirubin Total: 0.4 mg/dL (ref 0.0–1.2)
Bilirubin, Direct: 0.19 mg/dL (ref 0.00–0.40)
Total Protein: 7.4 g/dL (ref 6.0–8.5)

## 2020-10-21 NOTE — Telephone Encounter (Signed)
Patient returned call please call back

## 2020-10-21 NOTE — Telephone Encounter (Signed)
SEE RESULT NOTE 

## 2020-10-23 DIAGNOSIS — N186 End stage renal disease: Secondary | ICD-10-CM | POA: Diagnosis not present

## 2020-10-23 DIAGNOSIS — N2581 Secondary hyperparathyroidism of renal origin: Secondary | ICD-10-CM | POA: Diagnosis not present

## 2020-10-23 DIAGNOSIS — D689 Coagulation defect, unspecified: Secondary | ICD-10-CM | POA: Diagnosis not present

## 2020-10-23 DIAGNOSIS — Z992 Dependence on renal dialysis: Secondary | ICD-10-CM | POA: Diagnosis not present

## 2020-10-25 DIAGNOSIS — Z79899 Other long term (current) drug therapy: Secondary | ICD-10-CM | POA: Diagnosis not present

## 2020-10-25 DIAGNOSIS — E039 Hypothyroidism, unspecified: Secondary | ICD-10-CM | POA: Diagnosis not present

## 2020-10-25 NOTE — Progress Notes (Signed)
[address]       [place] [address]             [phone]                    Matthan A [facility] Medical Record #[id number] Date of Birth: January 04, 1946  Referring: [name], MD Primary Care: [name], MD Primary Cardiologist: Carlyle Dolly, MD  Chief Complaint:    Chief Complaint  Patient presents with  . Thoracic Aortic Aneurysm    Surgical consult, CTA C/A/P 09/24/20, ECHO 09/23/20     History of Present Illness:    DONTRELL STUCK 74 y.o. male is seen in the office for surgical evaluation after recent hospitalization for for fatigue and rapid heart rate found to be atrial flutter.  The patient is known to me from biopsy of a left lower lobe lung mass in March 2020.  Limited stage (T2b, N1, MO) small cell carcinoma.  He was treated systemically with carboplatin etoposide for 4 cycles-currently under observation by Dr. Julien Nordmann.  Since I have last seen the patient in 2020 he has developed chronic renal failure requiring dialysis, lost 80 pounds, and is currently on home oxygen..   CT scans going back to lung cancer screening CT October 2018 have demonstrated stable appearance of aneurysmal dilatation of the proximal aortic arch.  Most recent scan done while the patient was in the hospital suggested a 9 mm penetrating ulcer on the undersurface of the aortic arch.  "As noted on prior CT arteriogram, there is aneurysmal dilation of the aortic arch, with maximal transaxial dimensions of 4.2 x 4.2 cm on axial image # 13 and coronal reformat # 43. This is stable since prior examination and likely represents the long-term sequela of a remote penetrating atherosclerotic ulcer. There is an additional penetrating atherosclerotic ulcer demonstrating a diameter of approximately 9 mm seen along the undersurface of the aortic arch in the region of maximal dilation, best seen on axial image # 16, coronal reformat # 53, and sagittal reformat # 96. No  transmural extension or dissection. No mediastinal hematoma. The thoracic aorta remains dilated, measuring 3.2 cm at the level of the left atrium with fusiform dilation at the level of the diaphragmatic hiatus of 3.5 x 3.9 cm on sagittal reformat # 75 and coronal reformat # 74     Current Activity/ Functional Status:  Patient is independent with mobility/ambulation, transfers, ADL's, IADL's.   Zubrod Score: At the time of surgery this patient's most appropriate activity status/level should be described as: []     0    Normal activity, no symptoms []     1    Restricted in physical strenuous activity but ambulatory, able to do out light work [x]     2    Ambulatory and capable of self care, unable to do work activities, up and about               >50 % of waking hours                              []     3    Only limited self care, in bed greater than 50% of waking hours []     4    Completely disabled, no self care, confined to bed or chair []     5    Moribund   Past Medical History:  Diagnosis Date  . Anemia   . Blood transfusion without reported diagnosis   . CAD (coronary artery disease)    STENT... MID CIRCUMFLEX...1997  . Chronic kidney disease    STAGE 3  . COPD (chronic obstructive pulmonary disease) ([place])   . Degenerative joint disease (DJD) of lumbar spine   . GERD (gastroesophageal reflux disease)   . Gout   . Hyperlipidemia   . Hypertension   . Hypothyroidism   . Incisional hernia    abdomen  . Leukocytosis    CHRONIC MILD  . Myocardial infarction ([place])    1997  . SCL CA dx'd 01/2019   Lung cancer    Past Surgical History:  Procedure Laterality Date  . ABDOMINAL AORTIC ANEURYSM REPAIR  2006  . AV FISTULA PLACEMENT Left 04/25/2019   Procedure: ARTERIOVENOUS (AV) FISTULA CREATION LEFT ARM;  Surgeon: Angelia Mould, MD;  Location: [facility];  Service: Vascular;  Laterality: Left;  . AV FISTULA PLACEMENT Left 05/19/2019   Procedure: CONVERSION OF LEFT ARM  ARTERIOVENOUS FISTULA TO GRAFT;  Surgeon: Angelia Mould, MD;  Location: [facility];  Service: Vascular;  Laterality: Left;  . BIOPSY  09/30/2018   Procedure: BIOPSY;  Surgeon: Danie Binder, MD;  [facility];  Service: Endoscopy;;  ascending colon  . BIOPSY  07/27/2020   Procedure: BIOPSY;  Surgeon: Eloise Harman, DO;  [facility];  Service: Endoscopy;;  gastric  . BUBBLE STUDY  09/23/2020   Procedure: BUBBLE STUDY;  Surgeon: Skeet Latch, MD;  Location: [facility];  Service: Cardiovascular;;  . CARDIOVERSION N/A 09/23/2020   Procedure: CARDIOVERSION;  Surgeon: Skeet Latch, MD;  Location: [facility];  Service: Cardiovascular;  Laterality: N/A;  . COLONOSCOPY  2008  . COLONOSCOPY N/A 09/30/2018   External and internal hemorrhoids, six polyps removed, one ascending colon polypoid lesion biopsied. Six simple adenomas and one benign polypoid lesion. Colonoscopy Nov 2022.   [name] COLONOSCOPY WITH PROPOFOL N/A 07/27/2020   non-bleeding internal hemorrhoids, sigmoid and descending colon diverticulosis, four 1-2 mm polyps in ascending colon, one 5 mm polyp in transverse colon. 3 year surveillance. Tubular adenomas.   . CORONARY ANGIOPLASTY WITH STENT PLACEMENT  1997   MID CIRCUMFLEX  . CORONARY STENT INTERVENTION N/A 09/20/2020   Procedure: CORONARY STENT INTERVENTION;  Surgeon: Martinique, Peter M, MD;  Location: [facility];  Service: Cardiovascular;  Laterality: N/A;  . ESOPHAGOGASTRODUODENOSCOPY (EGD) WITH PROPOFOL N/A 07/27/2020   Food in middle third of esophagus, gastritis s/p biopsy, nodular mucosa in lesser curvature of stomach s/p biopsy. Negative H.pylori.   [name] HIP ARTHROPLASTY Right 04/30/2020   Procedure: ARTHROPLASTY  HIP (HEMIARTHROPLASTY);  Surgeon: Altamese [place] Lakes, MD;  Location: [facility];  Service: Orthopedics;  Laterality: Right;  . INTRAVASCULAR ULTRASOUND/IVUS N/A 09/20/2020   Procedure: Intravascular Ultrasound/IVUS;  Surgeon: Martinique, Peter M,  MD;  Location: [facility];  Service: Cardiovascular;  Laterality: N/A;  . IR FLUORO GUIDE CV LINE RIGHT  04/22/2019  . IR FLUORO GUIDE CV LINE RIGHT  05/01/2020  . IR THORACENTESIS ASP PLEURAL SPACE W/IMG GUIDE  09/22/2020  . IR THROMBECTOMY AV FISTULA W/THROMBOLYSIS/PTA INC/SHUNT/IMG LEFT Left 05/03/2020  . IR US GUIDE VASC ACCESS LEFT  05/03/2020  . IR US GUIDE VASC ACCESS RIGHT  04/22/2019  . IR US GUIDE VASC ACCESS RIGHT  05/01/2020  . POLYPECTOMY  09/30/2018   Procedure: POLYPECTOMY;  Surgeon: Danie Binder, MD;  [facility];  Service: Endoscopy;;  colon  .  POLYPECTOMY  07/27/2020   Procedure: POLYPECTOMY;  Surgeon: Eloise Harman, DO;  [facility];  Service: Endoscopy;;  . RIGHT/LEFT HEART CATH AND CORONARY ANGIOGRAPHY N/A 09/20/2020   Procedure: RIGHT/LEFT HEART CATH AND CORONARY ANGIOGRAPHY;  Surgeon: Martinique, Peter M, MD;  Location: [facility];  Service: Cardiovascular;  Laterality: N/A;  . TEE WITHOUT CARDIOVERSION N/A 09/23/2020   Procedure: TRANSESOPHAGEAL ECHOCARDIOGRAM (TEE);  Surgeon: Skeet Latch, MD;  Location: [facility];  Service: Cardiovascular;  Laterality: N/A;  . VIDEO BRONCHOSCOPY WITH ENDOBRONCHIAL NAVIGATION N/A 01/27/2019   Procedure: VIDEO BRONCHOSCOPY WITH ENDOBRONCHIAL NAVIGATION;  Surgeon: Grace Isaac, MD;  Location: [facility];  Service: Thoracic;  Laterality: N/A;  . VIDEO BRONCHOSCOPY WITH ENDOBRONCHIAL ULTRASOUND N/A 01/27/2019   Procedure: VIDEO BRONCHOSCOPY WITH ENDOBRONCHIAL ULTRASOUND;  Surgeon: Grace Isaac, MD;  [facility];  Service: Thoracic;  Laterality: N/A;    Family History  Problem Relation Age of Onset  . Stroke Brother   . Lung cancer Sister 40       lung cancer/former  . Colon cancer Neg Hx   . Colon polyps Neg Hx      Social History   Tobacco Use  Smoking Status Former Smoker  . Packs/day: 0.50  . Years: 54.00  . Pack years: 27.00  . Quit date: 09/17/2017  . Years since  quitting: 3.1  Smokeless Tobacco Never Used    Social History   Substance and Sexual Activity  Alcohol Use Not Currently   Comment: occasional     Allergies  Allergen Reactions  . Advair Hfa [Fluticasone-Salmeterol] Other (See Comments)    Developed thrush, although the mouth WAS being rinsed as directed  . Penicillins Rash    Has patient had a PCN reaction causing immediate rash, facial/tongue/throat swelling, SOB or lightheadedness with hypotension: No Has patient had a PCN reaction causing severe rash involving mucus membranes or skin necrosis: No Has patient had a PCN reaction that required hospitalization: No Has patient had a PCN reaction occurring within the last 10 years: No If all of the above answers are "NO", then may proceed with Cephalosporin use.     Current Outpatient Medications  Medication Sig Dispense Refill  . acetaminophen (TYLENOL) 325 MG tablet Take 2 tablets (650 mg total) by mouth every 6 (six) hours as needed for mild pain (or Fever >/= 101). (Patient taking differently: Take 1,300 mg by mouth every 6 (six) hours as needed for mild pain or headache (or Fever >/= 101). Arthritis strength) 30 tablet 0  . albuterol (VENTOLIN HFA) 108 (90 Base) MCG/ACT inhaler Inhale 2 puffs into the lungs every 6 (six) hours as needed for wheezing or shortness of breath.     . allopurinol (ZYLOPRIM) 100 MG tablet Take 1 tablet (100 mg total) by mouth daily. (Patient taking differently: Take 100 mg by mouth in the morning.) 30 tablet 1  . amiodarone (PACERONE) 200 MG tablet Take 2 tablets (400 mg total) by mouth 2 (two) times daily for 4 days, THEN 1 tablet (200 mg total) daily. 46 tablet 0  . apixaban (ELIQUIS) 2.5 MG TABS tablet Take 1 tablet (2.5 mg total) by mouth 2 (two) times daily. 60 tablet 1  . atorvastatin (LIPITOR) 40 MG tablet Take 1 tablet (40 mg total) by mouth daily. 30 tablet 1  . B Complex-C-Zn-Folic Acid (DIALYVITE 941-DEYC 15) 0.8 MG TABS Take 1 tablet by mouth  daily.    . clopidogrel (PLAVIX) 75 MG tablet Take 1 tablet (  75 mg total) by mouth daily with breakfast. 30 tablet 1  . Darbepoetin Alfa (ARANESP) 100 MCG/0.5ML SOSY injection Inject 0.5 mLs (100 mcg total) into the vein every Thursday with hemodialysis. 4.2 mL   . iron sucrose in sodium chloride 0.9 % 100 mL 50 mg.    . levothyroxine (SYNTHROID, LEVOTHROID) 175 MCG tablet Take 175 mcg by mouth daily before breakfast.     . lidocaine-prilocaine (EMLA) cream SMARTSIG:1 Topical Every Night    . Methoxy PEG-Epoetin Beta (MIRCERA IJ) Inject 30 mg into the vein every 28 (twenty-eight) days.     . metoprolol succinate (TOPROL-XL) 25 MG 24 hr tablet Take 1 tablet (25 mg total) by mouth daily. 30 tablet 1  . omeprazole (PRILOSEC) 40 MG capsule Take 1 capsule (40 mg total) by mouth in the morning and at bedtime. 60 capsule 5   No current facility-administered medications for this visit.    Pertinent items are noted in HPI.   Review of Systems:     Cardiac Review of Systems: [Y] = yes  or   [ N ] = no   Chest Pain [  n  ]  Resting SOB [  y ] Exertional SOB  [  y]  Orthopnea [  y]   Pedal Edema [  n ]    Palpitations Blue.Reese  ] Syncope  [ n ]   Presyncope [n   ]   General Review of Systems: [Y] = yes [  ]=no Constitional: recent weight change [ y ];  Wt loss over the last 3 months [   ] anorexia [  ]; fatigue [  y]; nausea [  ]; night sweats [  ]; fever [  ]; or chills [  ];           Eye : blurred vision [  ]; diplopia [   ]; vision changes [  ];  Amaurosis fugax[  ]; Resp: cough [  ];  wheezing[  ];  hemoptysis[  ]; shortness of breath[y  ]; paroxysmal nocturnal dyspnea[ y ]; dyspnea on exertion[ y ]; or orthopnea[  ];  GI:  gallstones[  ], vomiting[  ];  dysphagia[y  ]; melena[  ];  hematochezia [  ]; heartburn[  ];   Hx of  Colonoscopy[  ]; GU: kidney stones [  ]; hematuria[  ];   dysuria [  ];  nocturia[  ];  history of     obstruction [  ]; urinary frequency [  ]             Skin: rash, swelling[ y  ];, hair loss[  ];  peripheral edema[  ];  or itching[  ]; Musculosketetal: myalgias[  ];  joint swelling[  ];  joint erythema[  ];  joint pain[  ];  back pain[  ];  Heme/Lymph: bruising[  ];  bleeding[  ];  anemia[  ];  Neuro: TIA[  ];  headaches[  ];  stroke[  ];  vertigo[  ];  seizures[  ];   paresthesias[  ];  difficulty walking[  ];  Psych:depression[  ]; anxiety[  ];  Endocrine: diabetes[  ];  thyroid dysfunction[  ];  Immunizations: Flu up to date Blue.Reese  ]; Pneumococcal up to date Blue.Reese  ]; COVID-23 vacination completed  [  y ]  Other:     PHYSICAL EXAMINATION: BP 99/61   Pulse 72   Temp 97.6 F (36.4 C) (Skin)   Resp 20  Ht 5\' 9"  (1.753 m)   Wt 132 lb (59.9 kg)   SpO2 99% Comment: RA  BMI 19.49 kg/m  General appearance: alert, cooperative, cachectic and no distress Head: Normocephalic, without obvious abnormality, atraumatic Neck: no adenopathy, no carotid bruit, no JVD, supple, symmetrical, trachea midline and thyroid not enlarged, symmetric, no tenderness/mass/nodules Lymph nodes: Cervical, supraclavicular, and axillary nodes normal. Resp: diminished breath sounds LLL Cardio: regular rate and rhythm, S1, S2 normal, no murmur, click, rub or gallop GI: soft, non-tender; bowel sounds normal; no masses,  no organomegaly Extremities: extremities normal, atraumatic, no cyanosis or edema Neurologic: Grossly normal  Diagnostic Studies & Laboratory data:     Recent Radiology Findings:  CLINICAL DATA:  Thoracic aortic dissection, penetrating atherosclerotic ulcer  EXAM: CT ANGIOGRAPHY CHEST AND ABDOMEN  TECHNIQUE: Non-contrast CT of the chest was initially obtained.  Multidetector CT imaging through the chest and abdomen was performed using the standard protocol during bolus administration of intravenous contrast. Multiplanar reconstructed images and MIPs were obtained and reviewed to evaluate the vascular anatomy.  CONTRAST:  85mL OMNIPAQUE IOHEXOL 350 MG/ML  SOLN  COMPARISON:  CT chest 09/16/2020, PE protocol CTA chest 04/05/2020  FINDINGS: CTA CHEST FINDINGS  Cardiovascular: The ascending aorta is of normal caliber measuring 3.7 cm in greatest dimension. As noted on prior CT arteriogram, there is aneurysmal dilation of the aortic arch, with maximal transaxial dimensions of 4.2 x 4.2 cm on axial image # 13 and coronal reformat # 43. This is stable since prior examination and likely represents the long-term sequela of a remote penetrating atherosclerotic ulcer. There is an additional penetrating atherosclerotic ulcer demonstrating a diameter of approximately 9 mm seen along the undersurface of the aortic arch in the region of maximal dilation, best seen on axial image # 16, coronal reformat # 53, and sagittal reformat # 96. No transmural extension or dissection. No mediastinal hematoma. The thoracic aorta remains dilated, measuring 3.2 cm at the level of the left atrium with fusiform dilation at the level of the diaphragmatic hiatus of 3.5 x 3.9 cm on sagittal reformat # 75 and coronal reformat # 74.  There is moderate global cardiomegaly with left ventricular dilation. Extensive multi-vessel coronary artery calcification. Left circumflex coronary artery stenting has been performed. No pericardial effusion. The central pulmonary arteries are enlarged in keeping with changes of pulmonary arterial hypertension.  Mediastinum/Nodes: No pathologic thoracic adenopathy. The esophagus is fluid and debris-filled within its mid segment, suggesting changes of gastroesophageal reflux or esophageal dysmotility.  Lungs/Pleura: Severe emphysema within the visualized lungs. Perihilar cicatricial changes within the left perihilar region are compatible with prior radiation therapy. There is resultant left-sided volume loss. Small left pleural effusion is again seen. There is interval development of focal consolidation within the left lower lobe,  likely infectious or inflammatory in nature  Musculoskeletal: No acute bone abnormality within the visualized thorax. No lytic or blastic bone lesion identified.  Review of the MIP images confirms the above findings.  CTA ABDOMEN AND PELVIS FINDINGS  VASCULAR  Aorta: There is fusiform dilation of the infrarenal abdominal aorta measuring 3.0 cm in greatest dimension. Moderate superimposed mixed atherosclerotic plaque. No penetrating atherosclerotic ulcer. No dissection.  Celiac: Widely patent.  Normal anatomic configuration.  SMA: Focal dissection at the origin results in a 50% stenosis of the SMA in this region. Distally, the vessel is widely patent.  Renals: Single left renal artery is heavily diseased throughout its course. Single right renal artery demonstrates scattered disease resulting in a roughly  50% stenosis of its mid segment.  IMA: Occluded at its origin.  Inflow: Largely excluded  Veins: Morphologically normal  Review of the MIP images confirms the above findings.  NON-VASCULAR  Hepatobiliary: No focal liver abnormality is seen. No gallstones, gallbladder wall thickening, or biliary dilatation.  Pancreas: Unremarkable  Spleen: Normal in size without focal abnormality.  Adrenals/Urinary Tract: The adrenal glands are unremarkable. There is moderate left and mild right renal cortical atrophy. Multiple simple cortical cysts noted within the right kidney measuring up to 8.1 cm.  Stomach/Bowel: Small supraumbilical ventral hernia contains a single loop of unremarkable transverse colon. The stomach and visualized bowel is otherwise unremarkable.  Lymphatic: Shotty periaortic adenopathy is identified, stable from CT examination of 04/17/2019. No frankly pathologic adenopathy within the abdomen.  Other: None significant  Musculoskeletal: No acute bone abnormality within the abdomen.  Review of the MIP images confirms the above  findings.  IMPRESSION: Thoracoabdominal aortic aneurysm. Maximal dimension of the thoracic aorta is 4.2 cm at the arch. Focal dilation at the diaphragmatic hiatus with maximal dimension of 3.9 cm. Maximal dimension of the abdominal aorta is 3.0 cm in its infrarenal segment. Recommend semi-annual imaging followup by CTA or MRA and referral to cardiothoracic surgery if not already obtained. This recommendation follows 2010 ACCF/AHA/AATS/ACR/ASA/SCA/SCAI/SIR/STS/SVM Guidelines for the Diagnosis and Management of Patients With Thoracic Aortic Disease. Circulation. 2010; 121: M841-L24. Aortic aneurysm NOS (ICD10-I71.9)  Penetrating atherosclerotic ulcer noted along the undersurface of the aortic arch adjacent to the area of maximal dilation. This demonstrates interval decrease in size with increasing mural thrombus when compared to prior examination. Given its dynamic nature, follow-up evaluation in 6 months is recommended in the asymptomatic patient. If stable, further evaluation can be performed with routine surveillance imaging of the a thoracic aortic aneurysm.  Extensive coronary artery calcification. Moderate global cardiomegaly. Morphologic changes of pulmonary arterial hypertension.  Severe emphysema. Post radiation changes within the left perihilar region, unchanged.  New focal consolidation within the left lower lobe, likely infectious in the acute setting.  Peripheral vascular disease. 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 Scattered disease within the right renal artery results in 50% stenosis within its mid segment.  Aortic Atherosclerosis (ICD10-I70.0) and Emphysema (ICD10-J43.9). Aortic aneurysm NOS (ICD10-I71.9).   Electronically Signed   By: Fidela Salisbury MD   On: 09/24/2020 23:56    DG ESOPHAGUS W DOUBLE CM (HD)  Result Date: 10/05/2020 CLINICAL DATA:  Mid chest pain, food sticking in mid chest, on reflux medications,  recent endoscopy without dilatation, past history of abdominal aortic aneurysm repair EXAM: ESOPHOGRAM / BARIUM SWALLOW / BARIUM TABLET STUDY TECHNIQUE: Combined double contrast and single contrast examination performed using effervescent crystals, thick barium liquid, and thin barium liquid. The patient was observed with fluoroscopy swallowing a 13 mm barium sulphate tablet. FLUOROSCOPY TIME:  Fluoroscopy Time:  2 minutes 18 seconds Radiation Exposure Index (if provided by the fluoroscopic device): 44.4 mGy Number of Acquired Spot Images: multiple fluoroscopic screen captures COMPARISON:  None FINDINGS: Esophageal distention: Normal distention without mass or stricture Filling defects:  None 12.5 mm barium tablet: Easily passed from oral cavity to stomach without obstruction Motility: Severely impaired with for primary peristaltic waves and paucity of secondary/tertiary waves. Prolonged thoracic esophageal retention of contrast. Clearance of contrast is highly gravity dependent. Mucosa:  Smooth without irregularity or ulceration Hypopharynx/cervical esophagus: Minimal laryngeal penetration without aspiration. Mild vallecular and minimal piriform sinus residuals Hiatal hernia:  Not identified GE reflux:  Not identified Other: Atherosclerotic calcifications aorta and coronary arteries as well as coronary arterial stents. No new aneurysmal dilatation of the aortic arch. IMPRESSION: Significantly impaired esophageal motility without evidence of esophageal mass or stricture. Minimal laryngeal penetration and mild vallecular residuals without evidence of aspiration. Known aneurysmal dilatation ascending thoracic aorta with note of coronary arterial calcifications/stents. Aortic Atherosclerosis (ICD10-I70.0). Electronically Signed   By: Lavonia Dana M.D.   On: 10/05/2020 10:01     I have independently reviewed the above radiology studies  and reviewed the findings with the patient.   Recent Lab Findings: Lab Results   Component Value Date   WBC 7.0 09/25/2020   HGB 8.3 (L) 09/25/2020   HCT 25.1 (L) 09/25/2020   PLT 122 (L) 09/25/2020   GLUCOSE 115 (H) 09/25/2020   CHOL 87 09/23/2020   TRIG 76 09/23/2020   HDL 31 (L) 09/23/2020   LDLCALC 41 09/23/2020   ALT 26 10/20/2020   AST 30 10/20/2020   NA 132 (L) 09/25/2020   K 3.6 09/25/2020   CL 95 (L) 09/25/2020   CREATININE 6.44 (H) 09/25/2020   BUN 36 (H) 09/25/2020   CO2 24 09/25/2020   TSH 0.977 09/17/2020   INR 2.0 (H) 09/22/2020   HGBA1C 6.5 (H) 04/25/2019      Assessment / Plan:   #1 recent exacerbation of patient's shortness of breath symptoms with onset of atrial flutter #2 history of limited stage small cell carcinoma of the lung-under current observation with regular CT scans by Dr. Julien Nordmann #3 stable anterior aortic arch dilatation stable in size, question of 9 mm penetrating aortic ulcer on under the curve of the aortic arch without evidence of dissection or intramural hematoma #4 oxygen dependent underlying pulmonary disease #5 stage IV renal failure dialysis dependent  I reviewed the patient's recent history with he and his wife and the recent CT scans done.  At this point no surgical intervention is recommended.  The patient would be at extremely high risk for aortic arch replacement and/or stenting through the aortic arch with bypasses to cerebral vessels.  The patient currently obtains CT scans on a regular basis through medical oncology-he can continue to follow any changes in his aortic arch by cardiology  I have not made him a follow-up appoint to be seen in the thoracic surgery office at this point.   Grace Isaac MD      [address] [place] [address] Office [phone]     10/25/2020 3:29 PM

## 2020-10-26 DIAGNOSIS — Z992 Dependence on renal dialysis: Secondary | ICD-10-CM | POA: Diagnosis not present

## 2020-10-26 DIAGNOSIS — N186 End stage renal disease: Secondary | ICD-10-CM | POA: Diagnosis not present

## 2020-10-26 DIAGNOSIS — D689 Coagulation defect, unspecified: Secondary | ICD-10-CM | POA: Diagnosis not present

## 2020-10-26 DIAGNOSIS — N2581 Secondary hyperparathyroidism of renal origin: Secondary | ICD-10-CM | POA: Diagnosis not present

## 2020-10-27 ENCOUNTER — Telehealth: Payer: Self-pay | Admitting: Physician Assistant

## 2020-10-27 MED ORDER — AMIODARONE HCL 200 MG PO TABS: 200.0000 mg | ORAL_TABLET | Freq: Every day | ORAL | 1 refills | Status: DC

## 2020-10-27 NOTE — Telephone Encounter (Signed)
Pt ware, refilled amiodarone 200 mg qd

## 2020-10-27 NOTE — Telephone Encounter (Signed)
Patient should be taking Amiodarone 200 mg daily. I left message for patient to call back.

## 2020-10-27 NOTE — Telephone Encounter (Signed)
*  STAT* If patient is at the pharmacy, call can be transferred to refill team.   1. Which medications need to be refilled? (please list name of each medication and dose if known) amiodarone (PACERONE) 200 MG tablet [[id number]]   2. Which pharmacy/location (including street and city if local pharmacy) is medication to be sent to? [facility] on [address]   3. Do they need a 30 day or 90 day supply?  90 day  Pt is out of this medication, would like to know if he's still needing to take it. Please let pt know.

## 2020-10-28 DIAGNOSIS — N186 End stage renal disease: Secondary | ICD-10-CM | POA: Diagnosis not present

## 2020-10-28 DIAGNOSIS — D689 Coagulation defect, unspecified: Secondary | ICD-10-CM | POA: Diagnosis not present

## 2020-10-28 DIAGNOSIS — N2581 Secondary hyperparathyroidism of renal origin: Secondary | ICD-10-CM | POA: Diagnosis not present

## 2020-10-28 DIAGNOSIS — Z992 Dependence on renal dialysis: Secondary | ICD-10-CM | POA: Diagnosis not present

## 2020-10-30 DIAGNOSIS — D689 Coagulation defect, unspecified: Secondary | ICD-10-CM | POA: Diagnosis not present

## 2020-10-30 DIAGNOSIS — Z992 Dependence on renal dialysis: Secondary | ICD-10-CM | POA: Diagnosis not present

## 2020-10-30 DIAGNOSIS — N2581 Secondary hyperparathyroidism of renal origin: Secondary | ICD-10-CM | POA: Diagnosis not present

## 2020-10-30 DIAGNOSIS — N186 End stage renal disease: Secondary | ICD-10-CM | POA: Diagnosis not present

## 2020-11-01 DIAGNOSIS — I483 Typical atrial flutter: Secondary | ICD-10-CM | POA: Diagnosis not present

## 2020-11-01 DIAGNOSIS — J449 Chronic obstructive pulmonary disease, unspecified: Secondary | ICD-10-CM | POA: Diagnosis not present

## 2020-11-01 DIAGNOSIS — I428 Other cardiomyopathies: Secondary | ICD-10-CM | POA: Diagnosis not present

## 2020-11-01 DIAGNOSIS — E039 Hypothyroidism, unspecified: Secondary | ICD-10-CM | POA: Diagnosis not present

## 2020-11-02 DIAGNOSIS — N2581 Secondary hyperparathyroidism of renal origin: Secondary | ICD-10-CM | POA: Diagnosis not present

## 2020-11-02 DIAGNOSIS — D689 Coagulation defect, unspecified: Secondary | ICD-10-CM | POA: Diagnosis not present

## 2020-11-02 DIAGNOSIS — N186 End stage renal disease: Secondary | ICD-10-CM | POA: Diagnosis not present

## 2020-11-02 DIAGNOSIS — Z992 Dependence on renal dialysis: Secondary | ICD-10-CM | POA: Diagnosis not present

## 2020-11-03 DIAGNOSIS — T82858A Stenosis of vascular prosthetic devices, implants and grafts, initial encounter: Secondary | ICD-10-CM | POA: Diagnosis not present

## 2020-11-03 DIAGNOSIS — I871 Compression of vein: Secondary | ICD-10-CM | POA: Diagnosis not present

## 2020-11-03 DIAGNOSIS — Z992 Dependence on renal dialysis: Secondary | ICD-10-CM | POA: Diagnosis not present

## 2020-11-03 DIAGNOSIS — N186 End stage renal disease: Secondary | ICD-10-CM | POA: Diagnosis not present

## 2020-11-04 DIAGNOSIS — D689 Coagulation defect, unspecified: Secondary | ICD-10-CM | POA: Diagnosis not present

## 2020-11-04 DIAGNOSIS — Z992 Dependence on renal dialysis: Secondary | ICD-10-CM | POA: Diagnosis not present

## 2020-11-04 DIAGNOSIS — N186 End stage renal disease: Secondary | ICD-10-CM | POA: Diagnosis not present

## 2020-11-04 DIAGNOSIS — N2581 Secondary hyperparathyroidism of renal origin: Secondary | ICD-10-CM | POA: Diagnosis not present

## 2020-11-07 DIAGNOSIS — N2581 Secondary hyperparathyroidism of renal origin: Secondary | ICD-10-CM | POA: Diagnosis not present

## 2020-11-07 DIAGNOSIS — Z992 Dependence on renal dialysis: Secondary | ICD-10-CM | POA: Diagnosis not present

## 2020-11-07 DIAGNOSIS — N186 End stage renal disease: Secondary | ICD-10-CM | POA: Diagnosis not present

## 2020-11-07 DIAGNOSIS — D689 Coagulation defect, unspecified: Secondary | ICD-10-CM | POA: Diagnosis not present

## 2020-11-09 DIAGNOSIS — Z992 Dependence on renal dialysis: Secondary | ICD-10-CM | POA: Diagnosis not present

## 2020-11-09 DIAGNOSIS — N186 End stage renal disease: Secondary | ICD-10-CM | POA: Diagnosis not present

## 2020-11-09 DIAGNOSIS — N2581 Secondary hyperparathyroidism of renal origin: Secondary | ICD-10-CM | POA: Diagnosis not present

## 2020-11-09 DIAGNOSIS — D689 Coagulation defect, unspecified: Secondary | ICD-10-CM | POA: Diagnosis not present

## 2020-11-10 ENCOUNTER — Other Ambulatory Visit: Payer: Self-pay

## 2020-11-10 MED ORDER — AMIODARONE HCL 200 MG PO TABS
200.0000 mg | ORAL_TABLET | Freq: Every day | ORAL | 3 refills | Status: DC
Start: 2020-11-10 — End: 2021-04-26

## 2020-11-11 DIAGNOSIS — N2581 Secondary hyperparathyroidism of renal origin: Secondary | ICD-10-CM | POA: Diagnosis not present

## 2020-11-11 DIAGNOSIS — Z992 Dependence on renal dialysis: Secondary | ICD-10-CM | POA: Diagnosis not present

## 2020-11-11 DIAGNOSIS — N186 End stage renal disease: Secondary | ICD-10-CM | POA: Diagnosis not present

## 2020-11-11 DIAGNOSIS — D689 Coagulation defect, unspecified: Secondary | ICD-10-CM | POA: Diagnosis not present

## 2020-11-12 DIAGNOSIS — I129 Hypertensive chronic kidney disease with stage 1 through stage 4 chronic kidney disease, or unspecified chronic kidney disease: Secondary | ICD-10-CM | POA: Diagnosis not present

## 2020-11-12 DIAGNOSIS — Z992 Dependence on renal dialysis: Secondary | ICD-10-CM | POA: Diagnosis not present

## 2020-11-12 DIAGNOSIS — N186 End stage renal disease: Secondary | ICD-10-CM | POA: Diagnosis not present

## 2020-11-14 DIAGNOSIS — R52 Pain, unspecified: Secondary | ICD-10-CM | POA: Diagnosis not present

## 2020-11-14 DIAGNOSIS — N186 End stage renal disease: Secondary | ICD-10-CM | POA: Diagnosis not present

## 2020-11-14 DIAGNOSIS — Z23 Encounter for immunization: Secondary | ICD-10-CM | POA: Diagnosis not present

## 2020-11-14 DIAGNOSIS — D689 Coagulation defect, unspecified: Secondary | ICD-10-CM | POA: Diagnosis not present

## 2020-11-14 DIAGNOSIS — N2581 Secondary hyperparathyroidism of renal origin: Secondary | ICD-10-CM | POA: Diagnosis not present

## 2020-11-14 DIAGNOSIS — E039 Hypothyroidism, unspecified: Secondary | ICD-10-CM | POA: Diagnosis not present

## 2020-11-14 DIAGNOSIS — Z992 Dependence on renal dialysis: Secondary | ICD-10-CM | POA: Diagnosis not present

## 2020-11-16 ENCOUNTER — Ambulatory Visit: Payer: Medicare Other | Admitting: Urology

## 2020-11-16 DIAGNOSIS — Z992 Dependence on renal dialysis: Secondary | ICD-10-CM | POA: Diagnosis not present

## 2020-11-16 DIAGNOSIS — N2581 Secondary hyperparathyroidism of renal origin: Secondary | ICD-10-CM | POA: Diagnosis not present

## 2020-11-16 DIAGNOSIS — N186 End stage renal disease: Secondary | ICD-10-CM | POA: Diagnosis not present

## 2020-11-16 DIAGNOSIS — Z23 Encounter for immunization: Secondary | ICD-10-CM | POA: Diagnosis not present

## 2020-11-16 DIAGNOSIS — D689 Coagulation defect, unspecified: Secondary | ICD-10-CM | POA: Diagnosis not present

## 2020-11-16 DIAGNOSIS — R52 Pain, unspecified: Secondary | ICD-10-CM | POA: Diagnosis not present

## 2020-11-16 DIAGNOSIS — E039 Hypothyroidism, unspecified: Secondary | ICD-10-CM | POA: Diagnosis not present

## 2020-11-17 DIAGNOSIS — S72001D Fracture of unspecified part of neck of right femur, subsequent encounter for closed fracture with routine healing: Secondary | ICD-10-CM | POA: Diagnosis not present

## 2020-11-18 DIAGNOSIS — D689 Coagulation defect, unspecified: Secondary | ICD-10-CM | POA: Diagnosis not present

## 2020-11-18 DIAGNOSIS — Z23 Encounter for immunization: Secondary | ICD-10-CM | POA: Diagnosis not present

## 2020-11-18 DIAGNOSIS — E039 Hypothyroidism, unspecified: Secondary | ICD-10-CM | POA: Diagnosis not present

## 2020-11-18 DIAGNOSIS — N2581 Secondary hyperparathyroidism of renal origin: Secondary | ICD-10-CM | POA: Diagnosis not present

## 2020-11-18 DIAGNOSIS — Z992 Dependence on renal dialysis: Secondary | ICD-10-CM | POA: Diagnosis not present

## 2020-11-18 DIAGNOSIS — R52 Pain, unspecified: Secondary | ICD-10-CM | POA: Diagnosis not present

## 2020-11-18 DIAGNOSIS — N186 End stage renal disease: Secondary | ICD-10-CM | POA: Diagnosis not present

## 2020-11-19 ENCOUNTER — Other Ambulatory Visit: Payer: Self-pay

## 2020-11-19 ENCOUNTER — Ambulatory Visit: Payer: Medicare Other | Admitting: Urology

## 2020-11-19 ENCOUNTER — Telehealth: Payer: Self-pay | Admitting: Cardiology

## 2020-11-19 MED ORDER — CLOPIDOGREL BISULFATE 75 MG PO TABS
75.0000 mg | ORAL_TABLET | Freq: Every day | ORAL | 1 refills | Status: DC
Start: 2020-11-19 — End: 2020-11-22

## 2020-11-19 MED ORDER — APIXABAN 2.5 MG PO TABS
2.5000 mg | ORAL_TABLET | Freq: Two times a day (BID) | ORAL | 1 refills | Status: DC
Start: 2020-11-19 — End: 2020-12-06

## 2020-11-19 MED ORDER — METOPROLOL SUCCINATE ER 25 MG PO TB24
25.0000 mg | ORAL_TABLET | Freq: Every day | ORAL | 1 refills | Status: DC
Start: 2020-11-19 — End: 2020-11-22

## 2020-11-19 MED ORDER — ATORVASTATIN CALCIUM 40 MG PO TABS
40.0000 mg | ORAL_TABLET | Freq: Every day | ORAL | 1 refills | Status: DC
Start: 2020-11-19 — End: 2020-11-22

## 2020-11-19 NOTE — Telephone Encounter (Signed)
    1. Which medications need to be refilled? (please list name of each medication and dose if known)   1. Eliquis 2.5mg  2. Clopidogrel 75mg   3.  Metoprolol 25mg  4.  Lipitor 40 mg   2. Which pharmacy/location (including street and city if local pharmacy) is medication to be sent to? [facility]   3. Do they need a 30 day or 90 day supply? [place]

## 2020-11-19 NOTE — Telephone Encounter (Signed)
Medication refill requests approved and sent to [place]

## 2020-11-20 DIAGNOSIS — Z23 Encounter for immunization: Secondary | ICD-10-CM | POA: Diagnosis not present

## 2020-11-20 DIAGNOSIS — Z992 Dependence on renal dialysis: Secondary | ICD-10-CM | POA: Diagnosis not present

## 2020-11-20 DIAGNOSIS — D689 Coagulation defect, unspecified: Secondary | ICD-10-CM | POA: Diagnosis not present

## 2020-11-20 DIAGNOSIS — N186 End stage renal disease: Secondary | ICD-10-CM | POA: Diagnosis not present

## 2020-11-20 DIAGNOSIS — E039 Hypothyroidism, unspecified: Secondary | ICD-10-CM | POA: Diagnosis not present

## 2020-11-20 DIAGNOSIS — R52 Pain, unspecified: Secondary | ICD-10-CM | POA: Diagnosis not present

## 2020-11-20 DIAGNOSIS — N2581 Secondary hyperparathyroidism of renal origin: Secondary | ICD-10-CM | POA: Diagnosis not present

## 2020-11-22 ENCOUNTER — Telehealth: Payer: Self-pay | Admitting: Cardiology

## 2020-11-22 MED ORDER — CLOPIDOGREL BISULFATE 75 MG PO TABS: 75.0000 mg | ORAL_TABLET | Freq: Every day | ORAL | 1 refills | Status: DC

## 2020-11-22 MED ORDER — METOPROLOL SUCCINATE ER 25 MG PO TB24: 25.0000 mg | ORAL_TABLET | Freq: Every day | ORAL | 1 refills | Status: DC

## 2020-11-22 MED ORDER — ATORVASTATIN CALCIUM 40 MG PO TABS: 40.0000 mg | ORAL_TABLET | Freq: Every day | ORAL | 1 refills | Status: DC

## 2020-11-22 NOTE — Telephone Encounter (Signed)
Refilled to [facility] per request

## 2020-11-22 NOTE — Telephone Encounter (Signed)
Patient needs the following medications sent to [place], [place]  Clopidogrel 75mg    Metoprolol 25mg   Lipitor 40 mg   His mail order will not arrive until 1/17. States that he will run out of medications tomorrow.

## 2020-11-23 DIAGNOSIS — D689 Coagulation defect, unspecified: Secondary | ICD-10-CM | POA: Diagnosis not present

## 2020-11-23 DIAGNOSIS — N186 End stage renal disease: Secondary | ICD-10-CM | POA: Diagnosis not present

## 2020-11-23 DIAGNOSIS — Z23 Encounter for immunization: Secondary | ICD-10-CM | POA: Diagnosis not present

## 2020-11-23 DIAGNOSIS — R52 Pain, unspecified: Secondary | ICD-10-CM | POA: Diagnosis not present

## 2020-11-23 DIAGNOSIS — N2581 Secondary hyperparathyroidism of renal origin: Secondary | ICD-10-CM | POA: Diagnosis not present

## 2020-11-23 DIAGNOSIS — Z992 Dependence on renal dialysis: Secondary | ICD-10-CM | POA: Diagnosis not present

## 2020-11-23 DIAGNOSIS — E039 Hypothyroidism, unspecified: Secondary | ICD-10-CM | POA: Diagnosis not present

## 2020-11-24 ENCOUNTER — Other Ambulatory Visit: Payer: Self-pay

## 2020-11-24 ENCOUNTER — Encounter: Payer: Self-pay | Admitting: Internal Medicine

## 2020-11-24 ENCOUNTER — Ambulatory Visit (INDEPENDENT_AMBULATORY_CARE_PROVIDER_SITE_OTHER): Payer: Medicare Other | Admitting: Internal Medicine

## 2020-11-24 VITALS — BP 83/51 | HR 74 | Temp 98.2°F | Ht 69.0 in | Wt 143.8 lb

## 2020-11-24 DIAGNOSIS — R7989 Other specified abnormal findings of blood chemistry: Secondary | ICD-10-CM

## 2020-11-24 DIAGNOSIS — R1310 Dysphagia, unspecified: Secondary | ICD-10-CM

## 2020-11-24 DIAGNOSIS — K219 Gastro-esophageal reflux disease without esophagitis: Secondary | ICD-10-CM | POA: Diagnosis not present

## 2020-11-24 DIAGNOSIS — R945 Abnormal results of liver function studies: Secondary | ICD-10-CM | POA: Diagnosis not present

## 2020-11-24 DIAGNOSIS — K224 Dyskinesia of esophagus: Secondary | ICD-10-CM | POA: Diagnosis not present

## 2020-11-24 NOTE — Patient Instructions (Signed)
Continue on omeprazole 40 mg daily for your chronic reflux.  Congratulations on your weight gain, that is great.  Your liver numbers are basically normal now.  We will plan on rechecking these on follow-up visit in 6 months.  At [facility] we value your feedback. You may receive a survey about your visit today. Please share your experience as we strive to create trusting relationships with our patients to provide genuine, compassionate, quality care.  We appreciate your understanding and patience as we review any laboratory studies, imaging, and other diagnostic tests that are ordered as we care for you. Our office policy is 5 business days for review of these results, and any emergent or urgent results are addressed in a timely manner for your best interest. If you do not hear from our office in 1 week, please contact us.   We also encourage the use of MyChart, which contains your medical information for your review as well. If you are not enrolled in this feature, an access code is on this after visit summary for your convenience. Thank you for allowing [place] to be involved in your care.  It was great to see you today!  I hope you have a great rest of your winter!!    Elon Alas. Abbey Chatters, D.O. Gastroenterology and Hepatology [facility]

## 2020-11-25 DIAGNOSIS — R52 Pain, unspecified: Secondary | ICD-10-CM | POA: Diagnosis not present

## 2020-11-25 DIAGNOSIS — Z992 Dependence on renal dialysis: Secondary | ICD-10-CM | POA: Diagnosis not present

## 2020-11-25 DIAGNOSIS — N2581 Secondary hyperparathyroidism of renal origin: Secondary | ICD-10-CM | POA: Diagnosis not present

## 2020-11-25 DIAGNOSIS — N186 End stage renal disease: Secondary | ICD-10-CM | POA: Diagnosis not present

## 2020-11-25 DIAGNOSIS — E039 Hypothyroidism, unspecified: Secondary | ICD-10-CM | POA: Diagnosis not present

## 2020-11-25 DIAGNOSIS — D689 Coagulation defect, unspecified: Secondary | ICD-10-CM | POA: Diagnosis not present

## 2020-11-25 DIAGNOSIS — Z23 Encounter for immunization: Secondary | ICD-10-CM | POA: Diagnosis not present

## 2020-11-25 NOTE — Progress Notes (Signed)
Referring Provider: [name], MD Primary Care Physician:  [name], MD Primary GI:  Dr. Abbey Chatters  Chief Complaint  Patient presents with  . Anemia    F/u, doing ok. Has gained 3 lbs since 11/21    HPI:   Jeremy Johnson is a 75 y.o. male who presents to the clinic today for follow-up visit.  He has a history of esophageal dysmotility diagnosed on barium study.  Also previously had severe dysphagia and weight loss.  States this is actually improved dramatically.  Notes gaining 3 pounds.  Is eating much better.  He had a hospitalization in November 2021 with significantly elevated LFTs likely due to congestive hepatopathy in the setting of acute CHF.  His LFTs have been monitored since that time and have basically normalized.  Past Medical History:  Diagnosis Date  . Anemia   . Blood transfusion without reported diagnosis   . CAD (coronary artery disease)    STENT... MID CIRCUMFLEX...1997  . Chronic kidney disease    STAGE 3  . COPD (chronic obstructive pulmonary disease) ([place])   . Degenerative joint disease (DJD) of lumbar spine   . GERD (gastroesophageal reflux disease)   . Gout   . Hyperlipidemia   . Hypertension   . Hypothyroidism   . Incisional hernia    abdomen  . Leukocytosis    CHRONIC MILD  . Myocardial infarction ([place])    1997  . SCL CA dx'd 01/2019   Lung cancer    Past Surgical History:  Procedure Laterality Date  . ABDOMINAL AORTIC ANEURYSM REPAIR  2006  . AV FISTULA PLACEMENT Left 04/25/2019   Procedure: ARTERIOVENOUS (AV) FISTULA CREATION LEFT ARM;  Surgeon: Angelia Mould, MD;  Location: [place];  Service: Vascular;  Laterality: Left;  . AV FISTULA PLACEMENT Left 05/19/2019   Procedure: CONVERSION OF LEFT ARM ARTERIOVENOUS FISTULA TO GRAFT;  Surgeon: Angelia Mould, MD;  Location: [place];  Service: Vascular;  Laterality: Left;  . BIOPSY  09/30/2018   Procedure: BIOPSY;  Surgeon: Danie Binder, MD;  [facility];  Service:  Endoscopy;;  ascending colon  . BIOPSY  07/27/2020   Procedure: BIOPSY;  Surgeon: Eloise Harman, DO;  [facility];  Service: Endoscopy;;  gastric  . BUBBLE STUDY  09/23/2020   Procedure: BUBBLE STUDY;  Surgeon: Skeet Latch, MD;  Location: [place];  Service: Cardiovascular;;  . CARDIOVERSION N/A 09/23/2020   Procedure: CARDIOVERSION;  Surgeon: Skeet Latch, MD;  Location: [place];  Service: Cardiovascular;  Laterality: N/A;  . COLONOSCOPY  2008  . COLONOSCOPY N/A 09/30/2018   External and internal hemorrhoids, six polyps removed, one ascending colon polypoid lesion biopsied. Six simple adenomas and one benign polypoid lesion. Colonoscopy Nov 2022.   [name] COLONOSCOPY WITH PROPOFOL N/A 07/27/2020   non-bleeding internal hemorrhoids, sigmoid and descending colon diverticulosis, four 1-2 mm polyps in ascending colon, one 5 mm polyp in transverse colon. 3 year surveillance. Tubular adenomas.   . CORONARY ANGIOPLASTY WITH STENT PLACEMENT  1997   MID CIRCUMFLEX  . CORONARY STENT INTERVENTION N/A 09/20/2020   Procedure: CORONARY STENT INTERVENTION;  Surgeon: Martinique, Peter M, MD;  Location: [place] [facility];  Service: Cardiovascular;  Laterality: N/A;  . ESOPHAGOGASTRODUODENOSCOPY (EGD) WITH PROPOFOL N/A 07/27/2020   Food in middle third of esophagus, gastritis s/p biopsy, nodular mucosa in lesser curvature of stomach s/p biopsy. Negative H.pylori.   [name] HIP ARTHROPLASTY Right 04/30/2020   Procedure: ARTHROPLASTY  HIP (  HEMIARTHROPLASTY);  Surgeon: Altamese [place], MD;  Location: [place];  Service: Orthopedics;  Laterality: Right;  . INTRAVASCULAR ULTRASOUND/IVUS N/A 09/20/2020   Procedure: Intravascular Ultrasound/IVUS;  Surgeon: Martinique, Peter M, MD;  Location: [place] [facility];  Service: Cardiovascular;  Laterality: N/A;  . IR FLUORO GUIDE CV LINE RIGHT  04/22/2019  . IR FLUORO GUIDE CV LINE RIGHT  05/01/2020  . IR THORACENTESIS ASP PLEURAL SPACE W/IMG GUIDE  09/22/2020  . IR  THROMBECTOMY AV FISTULA W/THROMBOLYSIS/PTA INC/SHUNT/IMG LEFT Left 05/03/2020  . IR US GUIDE VASC ACCESS LEFT  05/03/2020  . IR US GUIDE VASC ACCESS RIGHT  04/22/2019  . IR US GUIDE VASC ACCESS RIGHT  05/01/2020  . POLYPECTOMY  09/30/2018   Procedure: POLYPECTOMY;  Surgeon: Danie Binder, MD;  [facility];  Service: Endoscopy;;  colon  . POLYPECTOMY  07/27/2020   Procedure: POLYPECTOMY;  Surgeon: Eloise Harman, DO;  [facility];  Service: Endoscopy;;  . RIGHT/LEFT HEART CATH AND CORONARY ANGIOGRAPHY N/A 09/20/2020   Procedure: RIGHT/LEFT HEART CATH AND CORONARY ANGIOGRAPHY;  Surgeon: Martinique, Peter M, MD;  Location: [place] [facility];  Service: Cardiovascular;  Laterality: N/A;  . TEE WITHOUT CARDIOVERSION N/A 09/23/2020   Procedure: TRANSESOPHAGEAL ECHOCARDIOGRAM (TEE);  Surgeon: Skeet Latch, MD;  Location: [place];  Service: Cardiovascular;  Laterality: N/A;  . VIDEO BRONCHOSCOPY WITH ENDOBRONCHIAL NAVIGATION N/A 01/27/2019   Procedure: VIDEO BRONCHOSCOPY WITH ENDOBRONCHIAL NAVIGATION;  Surgeon: Grace Isaac, MD;  Location: [place];  Service: Thoracic;  Laterality: N/A;  . VIDEO BRONCHOSCOPY WITH ENDOBRONCHIAL ULTRASOUND N/A 01/27/2019   Procedure: VIDEO BRONCHOSCOPY WITH ENDOBRONCHIAL ULTRASOUND;  Surgeon: Grace Isaac, MD;  Location: [place];  Service: Thoracic;  Laterality: N/A;    Current Outpatient Medications  Medication Sig Dispense Refill  . acetaminophen (TYLENOL) 325 MG tablet Take 2 tablets (650 mg total) by mouth every 6 (six) hours as needed for mild pain (or Fever >/= 101). (Patient taking differently: Take 1,300 mg by mouth every 6 (six) hours as needed for mild pain or headache (or Fever >/= 101). Arthritis strength) 30 tablet 0  . albuterol (VENTOLIN HFA) 108 (90 Base) MCG/ACT inhaler Inhale 2 puffs into the lungs every 6 (six) hours as needed for wheezing or shortness of breath.     . allopurinol (ZYLOPRIM) 100 MG tablet Take 1 tablet  (100 mg total) by mouth daily. (Patient taking differently: Take 100 mg by mouth in the morning.) 30 tablet 1  . amiodarone (PACERONE) 200 MG tablet Take 1 tablet (200 mg total) by mouth daily. 90 tablet 3  . apixaban (ELIQUIS) 2.5 MG TABS tablet Take 1 tablet (2.5 mg total) by mouth 2 (two) times daily. 60 tablet 1  . atorvastatin (LIPITOR) 40 MG tablet Take 1 tablet (40 mg total) by mouth daily. 30 tablet 1  . B Complex-C-Zn-Folic Acid (DIALYVITE 989-[id number] 15) 0.8 MG TABS Take 1 tablet by mouth daily.    . clopidogrel (PLAVIX) 75 MG tablet Take 1 tablet (75 mg total) by mouth daily with breakfast. 30 tablet 1  . Darbepoetin Alfa (ARANESP) 100 MCG/0.5ML SOSY injection Inject 0.5 mLs (100 mcg total) into the vein every Thursday with hemodialysis. 4.2 mL   . iron sucrose in sodium chloride 0.9 % 100 mL 50 mg.    . levothyroxine (SYNTHROID, LEVOTHROID) 175 MCG tablet Take 175 mcg by mouth daily before breakfast.     . lidocaine-prilocaine (EMLA) cream SMARTSIG:1 Topical Every Night    . Methoxy  PEG-Epoetin Beta (MIRCERA IJ) Inject 30 mg into the vein every 28 (twenty-eight) days.     . metoprolol succinate (TOPROL-XL) 25 MG 24 hr tablet Take 1 tablet (25 mg total) by mouth daily. 30 tablet 1  . omeprazole (PRILOSEC) 40 MG capsule Take 1 capsule (40 mg total) by mouth in the morning and at bedtime. (Patient taking differently: Take 40 mg by mouth daily.) 60 capsule 5   No current facility-administered medications for this visit.    Allergies as of 11/24/2020 - Review Complete 11/24/2020  Allergen Reaction Noted  . Advair hfa [fluticasone-salmeterol] Other (See Comments) 04/29/2020  . Penicillins Rash 02/09/2010    Family History  Problem Relation Age of Onset  . Stroke Brother   . Lung cancer Sister 44       lung cancer/former  . Colon cancer Neg Hx   . Colon polyps Neg Hx     Social History   Socioeconomic History  . Marital status: Married    Spouse name: Not on file  . Number of  children: 2  . Years of education: Not on file  . Highest education level: Not on file  Occupational History    Comment: retired  Tobacco Use  . Smoking status: Former Smoker    Packs/day: 0.50    Years: 54.00    Pack years: 27.00    Quit date: 09/17/2017    Years since quitting: 3.1  . Smokeless tobacco: Never Used  Vaping Use  . Vaping Use: Never used  Substance and Sexual Activity  . Alcohol use: Not Currently    Comment: occasional  . Drug use: No  . Sexual activity: Not Currently  Other Topics Concern  . Not on file  Social History Narrative  . Not on file   Social Determinants of Health   Financial Resource Strain: Not on file  Food Insecurity: Not on file  Transportation Needs: Not on file  Physical Activity: Not on file  Stress: Not on file  Social Connections: Not on file    Subjective: Review of Systems  Constitutional: Negative for chills and fever.  HENT: Negative for congestion and hearing loss.   Eyes: Negative for blurred vision and double vision.  Respiratory: Negative for cough and shortness of breath.   Cardiovascular: Negative for chest pain and palpitations.  Gastrointestinal: Negative for abdominal pain, blood in stool, constipation, diarrhea, heartburn, melena and vomiting.  Genitourinary: Negative for dysuria and urgency.  Musculoskeletal: Negative for joint pain and myalgias.  Skin: Negative for itching and rash.  Neurological: Negative for dizziness and headaches.  Psychiatric/Behavioral: Negative for depression. The patient is not nervous/anxious.      Objective: BP (!) 83/51   Pulse 74   Temp 98.2 F (36.8 C) (Temporal)   Ht 5\' 9"  (1.753 m)   Wt 143 lb 12.8 oz (65.2 kg)   BMI 21.24 kg/m  Physical Exam Constitutional:      Appearance: Normal appearance.  HENT:     Head: Normocephalic and atraumatic.  Eyes:     Extraocular Movements: Extraocular movements intact.     Conjunctiva/sclera: Conjunctivae normal.  Cardiovascular:      Rate and Rhythm: Normal rate and regular rhythm.  Pulmonary:     Effort: Pulmonary effort is normal.     Breath sounds: Normal breath sounds.  Abdominal:     General: Bowel sounds are normal.     Palpations: Abdomen is soft.  Musculoskeletal:        General: Normal range  of motion.     Cervical back: Normal range of motion and neck supple.  Skin:    General: Skin is warm.  Neurological:     General: No focal deficit present.     Mental Status: He is alert and oriented to person, place, and time.  Psychiatric:        Mood and Affect: Mood normal.        Behavior: Behavior normal.      Assessment: *Chronic GERD-well-controlled on omeprazole *Esophageal dysmotility *Dysphagia *Abnormal LFTs  Plan: Patient's chronic GERD appears to be well controlled omeprazole.  We will continue.  His dysphagia is actually improved.  He states he has gained weight as well.  He is very cautious with his eating habits and make sure that he cuts his food into small pieces.  Offered speech eval and he declined.  Overall doing well from a GI standpoint.  LFTs continue to improve.  We will recheck these on follow-up visit.  Patient to follow-up in 6 months or sooner if needed  11/25/2020 2:11 PM   Disclaimer: This note was dictated with voice recognition software. Similar sounding words can inadvertently be transcribed and may not be corrected upon review.

## 2020-11-27 DIAGNOSIS — Z992 Dependence on renal dialysis: Secondary | ICD-10-CM | POA: Diagnosis not present

## 2020-11-27 DIAGNOSIS — N2581 Secondary hyperparathyroidism of renal origin: Secondary | ICD-10-CM | POA: Diagnosis not present

## 2020-11-27 DIAGNOSIS — E039 Hypothyroidism, unspecified: Secondary | ICD-10-CM | POA: Diagnosis not present

## 2020-11-27 DIAGNOSIS — R52 Pain, unspecified: Secondary | ICD-10-CM | POA: Diagnosis not present

## 2020-11-27 DIAGNOSIS — Z23 Encounter for immunization: Secondary | ICD-10-CM | POA: Diagnosis not present

## 2020-11-27 DIAGNOSIS — D689 Coagulation defect, unspecified: Secondary | ICD-10-CM | POA: Diagnosis not present

## 2020-11-27 DIAGNOSIS — N186 End stage renal disease: Secondary | ICD-10-CM | POA: Diagnosis not present

## 2020-11-30 DIAGNOSIS — N2581 Secondary hyperparathyroidism of renal origin: Secondary | ICD-10-CM | POA: Diagnosis not present

## 2020-11-30 DIAGNOSIS — N186 End stage renal disease: Secondary | ICD-10-CM | POA: Diagnosis not present

## 2020-11-30 DIAGNOSIS — R52 Pain, unspecified: Secondary | ICD-10-CM | POA: Diagnosis not present

## 2020-11-30 DIAGNOSIS — Z23 Encounter for immunization: Secondary | ICD-10-CM | POA: Diagnosis not present

## 2020-11-30 DIAGNOSIS — E039 Hypothyroidism, unspecified: Secondary | ICD-10-CM | POA: Diagnosis not present

## 2020-11-30 DIAGNOSIS — D689 Coagulation defect, unspecified: Secondary | ICD-10-CM | POA: Diagnosis not present

## 2020-11-30 DIAGNOSIS — Z992 Dependence on renal dialysis: Secondary | ICD-10-CM | POA: Diagnosis not present

## 2020-12-01 ENCOUNTER — Other Ambulatory Visit: Payer: Self-pay | Admitting: Cardiology

## 2020-12-01 ENCOUNTER — Other Ambulatory Visit: Payer: Self-pay

## 2020-12-01 ENCOUNTER — Encounter: Payer: Self-pay | Admitting: Urology

## 2020-12-01 ENCOUNTER — Ambulatory Visit (INDEPENDENT_AMBULATORY_CARE_PROVIDER_SITE_OTHER): Payer: Medicare Other | Admitting: Urology

## 2020-12-01 VITALS — BP 95/54 | HR 78 | Temp 97.6°F | Ht 69.0 in | Wt 143.8 lb

## 2020-12-01 DIAGNOSIS — D494 Neoplasm of unspecified behavior of bladder: Secondary | ICD-10-CM

## 2020-12-01 DIAGNOSIS — N19 Unspecified kidney failure: Secondary | ICD-10-CM

## 2020-12-01 DIAGNOSIS — R310 Gross hematuria: Secondary | ICD-10-CM

## 2020-12-01 MED ORDER — CEPHALEXIN 250 MG PO CAPS: 250.0000 mg | ORAL_CAPSULE | Freq: Two times a day (BID) | ORAL | 0 refills | Status: DC

## 2020-12-01 NOTE — Progress Notes (Signed)
12/01/2020 11:45 AM   Jeremy Johnson 1946/09/07 [id number]  Referring provider: [name], MD [address] [place],  [place] [address]  Gross hematuria  HPI: Jeremy Johnson is a 75yo here for evaluation of gross hematuria. For the past 3 months he has been having gross hematuria with each void. He has a hx of ESRD on on HD. He makes only a few drops of urine daily. Intermittent dysuria. He is retired from [employer]. He has a 75pk year smoking hx.  He is on Eliquis and plavix.    PMH: Past Medical History:  Diagnosis Date  . Anemia   . Blood transfusion without reported diagnosis   . CAD (coronary artery disease)    STENT... MID CIRCUMFLEX...1997  . Chronic kidney disease    STAGE 3  . COPD (chronic obstructive pulmonary disease) ([place])   . Degenerative joint disease (DJD) of lumbar spine   . GERD (gastroesophageal reflux disease)   . Gout   . Hyperlipidemia   . Hypertension   . Hypothyroidism   . Incisional hernia    abdomen  . Leukocytosis    CHRONIC MILD  . Myocardial infarction ([place])    1997  . SCL CA dx'd 01/2019   Lung cancer    Surgical History: Past Surgical History:  Procedure Laterality Date  . ABDOMINAL AORTIC ANEURYSM REPAIR  2006  . AV FISTULA PLACEMENT Left 04/25/2019   Procedure: ARTERIOVENOUS (AV) FISTULA CREATION LEFT ARM;  Surgeon: Angelia Mould, MD;  Location: [facility];  Service: Vascular;  Laterality: Left;  . AV FISTULA PLACEMENT Left 05/19/2019   Procedure: CONVERSION OF LEFT ARM ARTERIOVENOUS FISTULA TO GRAFT;  Surgeon: Angelia Mould, MD;  Location: [facility];  Service: Vascular;  Laterality: Left;  . BIOPSY  09/30/2018   Procedure: BIOPSY;  Surgeon: Danie Binder, MD;  [facility];  Service: Endoscopy;;  ascending colon  . BIOPSY  07/27/2020   Procedure: BIOPSY;  Surgeon: Eloise Harman, DO;  [facility];  Service: Endoscopy;;  gastric  . BUBBLE STUDY  09/23/2020   Procedure: BUBBLE  STUDY;  Surgeon: Skeet Latch, MD;  Location: [facility];  Service: Cardiovascular;;  . CARDIOVERSION N/A 09/23/2020   Procedure: CARDIOVERSION;  Surgeon: Skeet Latch, MD;  Location: [facility];  Service: Cardiovascular;  Laterality: N/A;  . COLONOSCOPY  2008  . COLONOSCOPY N/A 09/30/2018   External and internal hemorrhoids, six polyps removed, one ascending colon polypoid lesion biopsied. Six simple adenomas and one benign polypoid lesion. Colonoscopy Nov 2022.   [name] COLONOSCOPY WITH PROPOFOL N/A 07/27/2020   non-bleeding internal hemorrhoids, sigmoid and descending colon diverticulosis, four 1-2 mm polyps in ascending colon, one 5 mm polyp in transverse colon. 3 year surveillance. Tubular adenomas.   . CORONARY ANGIOPLASTY WITH STENT PLACEMENT  1997   MID CIRCUMFLEX  . CORONARY STENT INTERVENTION N/A 09/20/2020   Procedure: CORONARY STENT INTERVENTION;  Surgeon: Martinique, Peter M, MD;  Location: [facility];  Service: Cardiovascular;  Laterality: N/A;  . ESOPHAGOGASTRODUODENOSCOPY (EGD) WITH PROPOFOL N/A 07/27/2020   Food in middle third of esophagus, gastritis s/p biopsy, nodular mucosa in lesser curvature of stomach s/p biopsy. Negative H.pylori.   [name] HIP ARTHROPLASTY Right 04/30/2020   Procedure: ARTHROPLASTY  HIP (HEMIARTHROPLASTY);  Surgeon: Altamese Lone Tree, MD;  Location: [facility];  Service: Orthopedics;  Laterality: Right;  . INTRAVASCULAR ULTRASOUND/IVUS N/A 09/20/2020   Procedure: Intravascular Ultrasound/IVUS;  Surgeon: Martinique, Peter M, MD;  Location: [facility];  Service: Cardiovascular;  Laterality: N/A;  . IR FLUORO GUIDE CV LINE RIGHT  04/22/2019  . IR FLUORO GUIDE CV LINE RIGHT  05/01/2020  . IR THORACENTESIS ASP PLEURAL SPACE W/IMG GUIDE  09/22/2020  . IR THROMBECTOMY AV FISTULA W/THROMBOLYSIS/PTA INC/SHUNT/IMG LEFT Left 05/03/2020  . IR US GUIDE VASC ACCESS LEFT  05/03/2020  . IR US GUIDE VASC ACCESS RIGHT  04/22/2019  . IR US GUIDE VASC ACCESS RIGHT  05/01/2020  .  POLYPECTOMY  09/30/2018   Procedure: POLYPECTOMY;  Surgeon: Danie Binder, MD;  [facility];  Service: Endoscopy;;  colon  . POLYPECTOMY  07/27/2020   Procedure: POLYPECTOMY;  Surgeon: Eloise Harman, DO;  [facility];  Service: Endoscopy;;  . RIGHT/LEFT HEART CATH AND CORONARY ANGIOGRAPHY N/A 09/20/2020   Procedure: RIGHT/LEFT HEART CATH AND CORONARY ANGIOGRAPHY;  Surgeon: Martinique, Peter M, MD;  Location: [facility];  Service: Cardiovascular;  Laterality: N/A;  . TEE WITHOUT CARDIOVERSION N/A 09/23/2020   Procedure: TRANSESOPHAGEAL ECHOCARDIOGRAM (TEE);  Surgeon: Skeet Latch, MD;  Location: [facility];  Service: Cardiovascular;  Laterality: N/A;  . VIDEO BRONCHOSCOPY WITH ENDOBRONCHIAL NAVIGATION N/A 01/27/2019   Procedure: VIDEO BRONCHOSCOPY WITH ENDOBRONCHIAL NAVIGATION;  Surgeon: Grace Isaac, MD;  Location: [facility];  Service: Thoracic;  Laterality: N/A;  . VIDEO BRONCHOSCOPY WITH ENDOBRONCHIAL ULTRASOUND N/A 01/27/2019   Procedure: VIDEO BRONCHOSCOPY WITH ENDOBRONCHIAL ULTRASOUND;  Surgeon: Grace Isaac, MD;  Location: [facility];  Service: Thoracic;  Laterality: N/A;    Home Medications:  Allergies as of 12/01/2020      Reactions   Advair Hfa [fluticasone-salmeterol] Other (See Comments)   Developed thrush, although the mouth WAS being rinsed as directed   Penicillins Rash   Has patient had a PCN reaction causing immediate rash, facial/tongue/throat swelling, SOB or lightheadedness with hypotension: No Has patient had a PCN reaction causing severe rash involving mucus membranes or skin necrosis: No Has patient had a PCN reaction that required hospitalization: No Has patient had a PCN reaction occurring within the last 10 years: No If all of the above answers are "NO", then may proceed with Cephalosporin use.      Medication List       Accurate as of December 01, 2020 11:45 AM. If you have any questions, ask your nurse or doctor.         acetaminophen 325 MG tablet Commonly known as: TYLENOL Take 2 tablets (650 mg total) by mouth every 6 (six) hours as needed for mild pain (or Fever >/= 101). What changed:   how much to take  reasons to take this  additional instructions   albuterol 108 (90 Base) MCG/ACT inhaler Commonly known as: VENTOLIN HFA Inhale 2 puffs into the lungs every 6 (six) hours as needed for wheezing or shortness of breath.   allopurinol 100 MG tablet Commonly known as: ZYLOPRIM Take 1 tablet (100 mg total) by mouth daily. What changed: when to take this   amiodarone 200 MG tablet Commonly known as: Pacerone Take 1 tablet (200 mg total) by mouth daily.   apixaban 2.5 MG Tabs tablet Commonly known as: ELIQUIS Take 1 tablet (2.5 mg total) by mouth 2 (two) times daily.   atorvastatin 40 MG tablet Commonly known as: LIPITOR Take 1 tablet (40 mg total) by mouth daily.   clopidogrel 75 MG tablet Commonly known as: PLAVIX Take 1 tablet (75 mg total) by mouth daily with breakfast.   Darbepoetin Alfa 100 MCG/0.5ML  Sosy injection Commonly known as: ARANESP Inject 0.5 mLs (100 mcg total) into the vein every Thursday with hemodialysis.   Dialyvite 800-Zinc 15 0.8 MG Tabs Take 1 tablet by mouth daily.   iron sucrose in sodium chloride 0.9 % 100 mL 50 mg.   levocetirizine 5 MG tablet Commonly known as: XYZAL Take 5 mg by mouth daily.   levothyroxine 175 MCG tablet Commonly known as: SYNTHROID Take 175 mcg by mouth daily before breakfast.   lidocaine-prilocaine cream Commonly known as: EMLA SMARTSIG:1 Topical Every Night   metoprolol succinate 25 MG 24 hr tablet Commonly known as: TOPROL-XL Take 1 tablet (25 mg total) by mouth daily.   metoprolol tartrate 50 MG tablet Commonly known as: LOPRESSOR Take 50 mg by mouth daily.   MIRCERA IJ Inject 30 mg into the vein every 28 (twenty-eight) days.   omeprazole 40 MG capsule Commonly known as: PRILOSEC Take 1 capsule (40 mg total) by  mouth in the morning and at bedtime. What changed: when to take this   pramipexole 0.25 MG tablet Commonly known as: MIRAPEX Take 0.25 mg by mouth at bedtime.       Allergies:  Allergies  Allergen Reactions  . Advair Hfa [Fluticasone-Salmeterol] Other (See Comments)    Developed thrush, although the mouth WAS being rinsed as directed  . Penicillins Rash    Has patient had a PCN reaction causing immediate rash, facial/tongue/throat swelling, SOB or lightheadedness with hypotension: No Has patient had a PCN reaction causing severe rash involving mucus membranes or skin necrosis: No Has patient had a PCN reaction that required hospitalization: No Has patient had a PCN reaction occurring within the last 10 years: No If all of the above answers are "NO", then may proceed with Cephalosporin use.     Family History: Family History  Problem Relation Age of Onset  . Stroke Brother   . Lung cancer Sister 61       lung cancer/former  . Colon cancer Neg Hx   . Colon polyps Neg Hx     Social History:  reports that he quit smoking about 3 years ago. He has a 27.00 pack-year smoking history. He has never used smokeless tobacco. He reports previous alcohol use. He reports that he does not use drugs.  ROS: All other review of systems were reviewed and are negative except what is noted above in HPI  Physical Exam: BP (!) 95/54   Pulse 78   Temp 97.6 F (36.4 C)   Ht 5\' 9"  (1.753 m)   Wt 143 lb 12.8 oz (65.2 kg)   BMI 21.24 kg/m   Constitutional:  Alert and oriented, No acute distress. HEENT: [place] AT, moist mucus membranes.  Trachea midline, no masses. Cardiovascular: No clubbing, cyanosis, or edema. Respiratory: Normal respiratory effort, no increased work of breathing. GI: Abdomen is soft, nontender, nondistended, no abdominal masses GU: No CVA tenderness.  Lymph: No cervical or inguinal lymphadenopathy. Skin: No rashes, bruises or suspicious lesions. Neurologic: Grossly intact,  no focal deficits, moving all 4 extremities. Psychiatric: Normal mood and affect.  Laboratory Data: Lab Results  Component Value Date   WBC 7.0 09/25/2020   HGB 8.3 (L) 09/25/2020   HCT 25.1 (L) 09/25/2020   MCV 99.6 09/25/2020   PLT 122 (L) 09/25/2020    Lab Results  Component Value Date   CREATININE 6.44 (H) 09/25/2020    No results found for: PSA  No results found for: TESTOSTERONE  Lab Results  Component Value Date  HGBA1C 6.5 (H) 04/25/2019    Urinalysis    Component Value Date/Time   COLORURINE YELLOW 05/22/2019 1919   APPEARANCEUR CLEAR 05/22/2019 1919   LABSPEC 1.022 05/22/2019 1919   PHURINE 8.0 05/22/2019 1919   GLUCOSEU 150 (A) 05/22/2019 1919   HGBUR MODERATE (A) 05/22/2019 [place] NEGATIVE 05/22/2019 [place] NEGATIVE 05/22/2019 1919   PROTEINUR >=300 (A) 05/22/2019 1919   NITRITE NEGATIVE 05/22/2019 1919   LEUKOCYTESUR NEGATIVE 05/22/2019 1919    Lab Results  Component Value Date   BACTERIA RARE (A) 05/22/2019    Pertinent Imaging: CT 09/24/2020: Images reviewed and discussed with the patient No results found for this or any previous visit.  No results found for this or any previous visit.  No results found for this or any previous visit.  No results found for this or any previous visit.  Results for orders placed during the hospital encounter of 04/14/19  US RENAL  Narrative CLINICAL DATA:  Acute renal disease.  Chronic renal disease.  EXAM: RENAL / URINARY TRACT ULTRASOUND COMPLETE  COMPARISON:  PET-CT 01/22/2019.  FINDINGS: Right Kidney:  Renal measurements: 12.8 x 6.0 x 6.1 cm = volume: 242.3 mL. Increased echogenicity. 8.8 cm simple cyst. 3.6 cm simple cyst. No hydronephrosis visualized.  Left Kidney:  Renal measurements: 12.9 x 6.1 x 5.3 cm = volume: 219.5 mL. Increased echogenicity. No mass or hydronephrosis visualized.  Bladder:  Appears normal for degree of bladder  distention.  IMPRESSION: Increased echogenicity both kidneys. This consistent chronic medical renal disease. Two simple cyst right kidney. No evidence of hydronephrosis or bladder distention. No acute abnormality identified.   Electronically Signed By: Marcello Moores  Register On: 04/15/2019 11:33  No results found for this or any previous visit.  No results found for this or any previous visit.  No results found for this or any previous visit.   Assessment & Plan:    1.  Gross hematuria -Likely related to bladder tumor versus UTI. Urine for cystology and culture sent  2. Bladder Tumor  We discussed the management including bladder tumor resection and the patient wishes to proceed with surgery. Risks/benefits/alternatives discussed. - Urinalysis, Routine w reflex microscopic   No follow-ups on file.  Nicolette Bang, MD  [facility] [place]   Cystoscopy Procedure Note  Patient identification was confirmed, informed consent was obtained, and patient was prepped using Betadine solution.  Lidocaine jelly was administered per urethral meatus.     Pre-Procedure: - Inspection reveals a normal caliber ureteral meatus.  Procedure: The flexible cystoscope was introduced without difficulty - No urethral strictures/lesions are present. - Enlarged prostate  - Normal bladder neck - Bilateral ureteral orifices identified - Bladder mucosa  reveals multiple papillary tumors 1-2cm on dome and anterior bladder wall. - No bladder stones - No trabeculation  Retroflexion shows 1cm intravesical prostatic protrusion   Post-Procedure: - Patient tolerated the procedure well  Assessment/ Plan:   No follow-ups on file.  Nicolette Bang, MD

## 2020-12-01 NOTE — Progress Notes (Signed)
Urological Symptom Review  Patient is experiencing the following symptoms: Blood in urine   Review of Systems  Gastrointestinal (upper)  : Negative for upper GI symptoms  Gastrointestinal (lower) : Negative for lower GI symptoms  Constitutional : Weight loss  Skin: Negative for skin symptoms  Eyes: Negative for eye symptoms  Ear/Nose/Throat : Negative for Ear/Nose/Throat symptoms  Hematologic/Lymphatic: Negative for Hematologic/Lymphatic symptoms  Cardiovascular : Negative for cardiovascular symptoms  Respiratory : Negative for respiratory symptoms  Endocrine: Negative for endocrine symptoms  Musculoskeletal: Negative for musculoskeletal symptoms  Neurological: Negative for neurological symptoms  Psychologic: Negative for psychiatric symptoms

## 2020-12-02 DIAGNOSIS — J449 Chronic obstructive pulmonary disease, unspecified: Secondary | ICD-10-CM | POA: Diagnosis not present

## 2020-12-02 DIAGNOSIS — Z23 Encounter for immunization: Secondary | ICD-10-CM | POA: Diagnosis not present

## 2020-12-02 DIAGNOSIS — N2581 Secondary hyperparathyroidism of renal origin: Secondary | ICD-10-CM | POA: Diagnosis not present

## 2020-12-02 DIAGNOSIS — Z992 Dependence on renal dialysis: Secondary | ICD-10-CM | POA: Diagnosis not present

## 2020-12-02 DIAGNOSIS — E039 Hypothyroidism, unspecified: Secondary | ICD-10-CM | POA: Diagnosis not present

## 2020-12-02 DIAGNOSIS — D689 Coagulation defect, unspecified: Secondary | ICD-10-CM | POA: Diagnosis not present

## 2020-12-02 DIAGNOSIS — R52 Pain, unspecified: Secondary | ICD-10-CM | POA: Diagnosis not present

## 2020-12-02 DIAGNOSIS — N186 End stage renal disease: Secondary | ICD-10-CM | POA: Diagnosis not present

## 2020-12-02 LAB — CYTOLOGY - NON PAP

## 2020-12-03 ENCOUNTER — Other Ambulatory Visit: Payer: Self-pay | Admitting: Cardiology

## 2020-12-03 LAB — URINE CULTURE: Organism ID, Bacteria: NO GROWTH

## 2020-12-04 DIAGNOSIS — N2581 Secondary hyperparathyroidism of renal origin: Secondary | ICD-10-CM | POA: Diagnosis not present

## 2020-12-04 DIAGNOSIS — R52 Pain, unspecified: Secondary | ICD-10-CM | POA: Diagnosis not present

## 2020-12-04 DIAGNOSIS — N186 End stage renal disease: Secondary | ICD-10-CM | POA: Diagnosis not present

## 2020-12-04 DIAGNOSIS — Z23 Encounter for immunization: Secondary | ICD-10-CM | POA: Diagnosis not present

## 2020-12-04 DIAGNOSIS — D689 Coagulation defect, unspecified: Secondary | ICD-10-CM | POA: Diagnosis not present

## 2020-12-04 DIAGNOSIS — Z992 Dependence on renal dialysis: Secondary | ICD-10-CM | POA: Diagnosis not present

## 2020-12-04 DIAGNOSIS — E039 Hypothyroidism, unspecified: Secondary | ICD-10-CM | POA: Diagnosis not present

## 2020-12-06 ENCOUNTER — Telehealth: Payer: Self-pay | Admitting: Cardiology

## 2020-12-06 NOTE — Telephone Encounter (Signed)
   [facility] Pre-operative Risk Assessment    HEARTCARE STAFF: - Please ensure there is not already an duplicate clearance open for this procedure. - Under Visit Info/Reason for Call, type in Other and utilize the format Clearance MM/DD/YY or Clearance TBD. Do not use dashes or single digits. - If request is for dental extraction, please clarify the # of teeth to be extracted.  Request for surgical clearance:  1. What type of surgery is being performed? Cystoscopy with bilateral retrograde with transurethral resection bladder tumor   2. When is this surgery scheduled? 01/31/21  3. What type of clearance is required (medical clearance vs. Pharmacy clearance to hold med vs. Both)? medical  4. Are there any medications that need to be held prior to surgery and how long? Patient to stay on anticoagulation medication   5. Practice name and name of physician performing surgery? [facility] - Dr Nicolette Bang   6. What is the office phone number?  [phone]   7.   What is the office fax number? [phone]  8.   Anesthesia type (None, local, MAC, general) ? General    Jannet Askew 12/06/2020, 9:07 AM  _________________________________________________________________   (provider comments below)

## 2020-12-06 NOTE — Telephone Encounter (Addendum)
Primary Cardiologist:Branch, Roderic Palau, MD  Chart reviewed as part of pre-operative protocol coverage. Because of Jeremy Johnson's past medical history and time since last visit, he/she will require a follow-up visit in order to better assess preoperative cardiovascular risk.  Pre-op covering staff: - Please schedule appointment and call patient to inform them. - Please contact requesting surgeon's office via preferred method (i.e, phone, fax) to inform them of need for appointment prior to surgery.  If applicable, this message will also be routed to pharmacy pool and/or primary cardiologist for input on holding anticoagulant/antiplatelet agent as requested below so that this information is available at time of patient's appointment.   Deberah Pelton, NP  12/06/2020, 9:26 AM

## 2020-12-07 ENCOUNTER — Encounter: Payer: Self-pay | Admitting: Urology

## 2020-12-07 DIAGNOSIS — R52 Pain, unspecified: Secondary | ICD-10-CM | POA: Diagnosis not present

## 2020-12-07 DIAGNOSIS — E039 Hypothyroidism, unspecified: Secondary | ICD-10-CM | POA: Diagnosis not present

## 2020-12-07 DIAGNOSIS — Z992 Dependence on renal dialysis: Secondary | ICD-10-CM | POA: Diagnosis not present

## 2020-12-07 DIAGNOSIS — N2581 Secondary hyperparathyroidism of renal origin: Secondary | ICD-10-CM | POA: Diagnosis not present

## 2020-12-07 DIAGNOSIS — R310 Gross hematuria: Secondary | ICD-10-CM | POA: Insufficient documentation

## 2020-12-07 DIAGNOSIS — Z23 Encounter for immunization: Secondary | ICD-10-CM | POA: Diagnosis not present

## 2020-12-07 DIAGNOSIS — N186 End stage renal disease: Secondary | ICD-10-CM | POA: Diagnosis not present

## 2020-12-07 DIAGNOSIS — D689 Coagulation defect, unspecified: Secondary | ICD-10-CM | POA: Diagnosis not present

## 2020-12-07 DIAGNOSIS — D494 Neoplasm of unspecified behavior of bladder: Secondary | ICD-10-CM | POA: Insufficient documentation

## 2020-12-07 NOTE — Patient Instructions (Signed)
Transurethral Resection of Bladder Tumor  Transurethral resection of a bladder tumor is the removal (resection) of a cancerous growth (tumor) on the inside wall of the bladder. The bladder is the organ that holds urine. The tumor is removed through the tube that carries urine out of the body (urethra). In a transurethral resection, a thin telescope with a light, a tiny camera, and an electric cutting edge (resectoscope) is passed through the urethra. In men, the opening of the urethra is at the end of the penis. In women, it is just above the opening of the vagina. Tell a health care provider about:  Any allergies you have.  All medicines you are taking, including vitamins, herbs, eye drops, creams, and over-the-counter medicines.  Any problems you or family members have had with anesthetic medicines.  Any blood disorders you have.  Any surgeries you have had.  Any medical conditions you have.  Any recent urinary tract infections you have had.  Whether you are pregnant or may be pregnant. What are the risks? Generally, this is a safe procedure. However, problems may occur, including:  Infection.  Bleeding.  Allergic reactions to medicines.  Damage to nearby structures or organs, such as: ? The urethra. ? The tubes that drain urine from the kidneys into the bladder (ureters).  Pain and burning during urination.  Difficulty urinating due to partial blockage of the urethra.  Inability to urinate (urinary retention). What happens before the procedure? Staying hydrated Follow instructions from your health care provider about hydration, which may include:  Up to 2 hours before the procedure - you may continue to drink clear liquids, such as water, clear fruit juice, black coffee, and plain tea.   Eating and drinking restrictions Follow instructions from your health care provider about eating and drinking, which may include:  8 hours before the procedure - stop eating heavy  meals or foods, such as meat, fried foods, or fatty foods.  6 hours before the procedure - stop eating light meals or foods, such as toast or cereal.  6 hours before the procedure - stop drinking milk or drinks that contain milk.  2 hours before the procedure - stop drinking clear liquids. Medicines Ask your health care provider about:  Changing or stopping your regular medicines. This is especially important if you are taking diabetes medicines or blood thinners.  Taking medicines such as aspirin and ibuprofen. These medicines can thin your blood. Do not take these medicines unless your health care provider tells you to take them.  Taking over-the-counter medicines, vitamins, herbs, and supplements. Tests You may have exams or tests, including:  Physical exam.  Blood tests.  Urine tests.  Electrocardiogram (ECG). This test measures the electrical activity of the heart. General instructions  Plan to have someone take you home from the hospital or clinic.  Ask your health care provider how your surgical site will be marked or identified.  Ask your health care provider what steps will be taken to help prevent infection. These may include: ? Washing skin with a germ-killing soap. ? Taking antibiotic medicine. What happens during the procedure?  An IV will be inserted into one of your veins.  You will be given one or more of the following: ? A medicine to help you relax (sedative). ? A medicine to make you fall asleep (general anesthetic). ? A medicine that is injected into your spine to numb the area below and slightly above the injection site (spinal anesthetic).  Your legs will  be placed in foot rests (stirrups) so that your legs are apart and your knees are bent.  The resectoscope will be passed through your urethra and into your bladder.  The part of your bladder that is affected by the tumor will be resected using the cutting edge of the resectoscope.  The  resectoscope will be removed.  A thin, flexible tube (catheter) will be passed through your urethra and into your bladder. The catheter will drain urine into a bag outside of your body. ? Fluid may be passed through the catheter to keep the catheter open. The procedure may vary among health care providers and hospitals. What happens after the procedure?  Your blood pressure, heart rate, breathing rate, and blood oxygen level will be monitored until you leave the hospital or clinic.  You may continue to receive fluids and medicines through an IV.  You will have some pain. You will be given pain medicine to relieve pain.  You will have a catheter to drain your urine. ? You will have blood in your urine. Your catheter may be kept in until your urine is clear. ? The amount of urine will be monitored. If necessary, your bladder may be rinsed out (irrigated) by passing fluid through your catheter.  You will be encouraged to walk around as soon as possible.  You may have to wear compression stockings. These stockings help to prevent blood clots and reduce swelling in your legs.  Do not drive for 24 hours if you were given a sedative during your procedure. Summary  Transurethral resection of a bladder tumor is the removal (resection) of a cancerous growth (tumor) on the inside wall of the bladder.  To do this procedure, your health care provider uses a thin telescope with a light, a tiny camera, and an electric cutting edge (resectoscope).  Follow your health care provider's instructions. You may need to stop or change certain medicines, and you may be told to stop eating and drinking several hours before the procedure.  Your blood pressure, heart rate, breathing rate, and blood oxygen level will be monitored until you leave the hospital or clinic.  You may have to wear compression stockings. These stockings help to prevent blood clots and reduce swelling in your legs. This information is  not intended to replace advice given to you by your health care provider. Make sure you discuss any questions you have with your health care provider. Document Revised: 05/31/2018 Document Reviewed: 05/31/2018 Elsevier Patient Education  [place].

## 2020-12-07 NOTE — Telephone Encounter (Signed)
Will send message to [facility] to see if they can move echo up sooner for pre op clearance. See notes.

## 2020-12-08 NOTE — Progress Notes (Signed)
Sent via mychart

## 2020-12-08 NOTE — Telephone Encounter (Signed)
Patient is rescheduled to have Echo performed on 12/15/20 2 PM at [facility]. Will call requesting office with new date.

## 2020-12-09 ENCOUNTER — Telehealth: Payer: Self-pay

## 2020-12-09 DIAGNOSIS — Z992 Dependence on renal dialysis: Secondary | ICD-10-CM | POA: Diagnosis not present

## 2020-12-09 DIAGNOSIS — N2581 Secondary hyperparathyroidism of renal origin: Secondary | ICD-10-CM | POA: Diagnosis not present

## 2020-12-09 DIAGNOSIS — R52 Pain, unspecified: Secondary | ICD-10-CM | POA: Diagnosis not present

## 2020-12-09 DIAGNOSIS — N186 End stage renal disease: Secondary | ICD-10-CM | POA: Diagnosis not present

## 2020-12-09 DIAGNOSIS — D689 Coagulation defect, unspecified: Secondary | ICD-10-CM | POA: Diagnosis not present

## 2020-12-09 DIAGNOSIS — E039 Hypothyroidism, unspecified: Secondary | ICD-10-CM | POA: Diagnosis not present

## 2020-12-09 DIAGNOSIS — Z23 Encounter for immunization: Secondary | ICD-10-CM | POA: Diagnosis not present

## 2020-12-09 NOTE — Telephone Encounter (Signed)
Patient notified of results. Pt on schedule for surgery

## 2020-12-09 NOTE — Telephone Encounter (Signed)
-----   Message from Cleon Gustin, MD sent at 12/07/2020 11:00 AM EST ----- Cytology was positive for tumor ----- Message ----- From: Dorisann Frames, RN Sent: 12/02/2020   2:57 PM EST To: Cleon Gustin, MD  Please review

## 2020-12-10 NOTE — Telephone Encounter (Signed)
Pending echo on 2/2/

## 2020-12-11 DIAGNOSIS — Z23 Encounter for immunization: Secondary | ICD-10-CM | POA: Diagnosis not present

## 2020-12-11 DIAGNOSIS — N186 End stage renal disease: Secondary | ICD-10-CM | POA: Diagnosis not present

## 2020-12-11 DIAGNOSIS — D689 Coagulation defect, unspecified: Secondary | ICD-10-CM | POA: Diagnosis not present

## 2020-12-11 DIAGNOSIS — E039 Hypothyroidism, unspecified: Secondary | ICD-10-CM | POA: Diagnosis not present

## 2020-12-11 DIAGNOSIS — N2581 Secondary hyperparathyroidism of renal origin: Secondary | ICD-10-CM | POA: Diagnosis not present

## 2020-12-11 DIAGNOSIS — Z992 Dependence on renal dialysis: Secondary | ICD-10-CM | POA: Diagnosis not present

## 2020-12-11 DIAGNOSIS — R52 Pain, unspecified: Secondary | ICD-10-CM | POA: Diagnosis not present

## 2020-12-13 DIAGNOSIS — I129 Hypertensive chronic kidney disease with stage 1 through stage 4 chronic kidney disease, or unspecified chronic kidney disease: Secondary | ICD-10-CM | POA: Diagnosis not present

## 2020-12-13 DIAGNOSIS — N186 End stage renal disease: Secondary | ICD-10-CM | POA: Diagnosis not present

## 2020-12-13 DIAGNOSIS — Z992 Dependence on renal dialysis: Secondary | ICD-10-CM | POA: Diagnosis not present

## 2020-12-14 DIAGNOSIS — N2581 Secondary hyperparathyroidism of renal origin: Secondary | ICD-10-CM | POA: Diagnosis not present

## 2020-12-14 DIAGNOSIS — Z992 Dependence on renal dialysis: Secondary | ICD-10-CM | POA: Diagnosis not present

## 2020-12-14 DIAGNOSIS — N186 End stage renal disease: Secondary | ICD-10-CM | POA: Diagnosis not present

## 2020-12-14 DIAGNOSIS — Z23 Encounter for immunization: Secondary | ICD-10-CM | POA: Diagnosis not present

## 2020-12-14 DIAGNOSIS — D689 Coagulation defect, unspecified: Secondary | ICD-10-CM | POA: Diagnosis not present

## 2020-12-15 ENCOUNTER — Encounter: Payer: Self-pay | Admitting: *Deleted

## 2020-12-15 ENCOUNTER — Ambulatory Visit (HOSPITAL_COMMUNITY)
Admission: RE | Admit: 2020-12-15 | Discharge: 2020-12-15 | Disposition: A | Discharge status: Home / Self Care | Payer: Medicare Other | Source: Ambulatory Visit | Attending: Physician Assistant | Admitting: Physician Assistant

## 2020-12-15 ENCOUNTER — Other Ambulatory Visit: Payer: Self-pay

## 2020-12-15 DIAGNOSIS — I361 Nonrheumatic tricuspid (valve) insufficiency: Secondary | ICD-10-CM | POA: Diagnosis not present

## 2020-12-15 DIAGNOSIS — I428 Other cardiomyopathies: Secondary | ICD-10-CM | POA: Diagnosis not present

## 2020-12-15 LAB — ECHOCARDIOGRAM COMPLETE
Area-P 1/2: 2.78 cm2
Calc EF: 28.8 %
S' Lateral: 5.9 cm
Single Plane A2C EF: 31.1 %
Single Plane A4C EF: 28.9 %

## 2020-12-15 NOTE — Progress Notes (Signed)
*  PRELIMINARY RESULTS* Echocardiogram 2D Echocardiogram has been performed.  Jeremy Johnson 12/15/2020, 2:47 PM

## 2020-12-16 DIAGNOSIS — D689 Coagulation defect, unspecified: Secondary | ICD-10-CM | POA: Diagnosis not present

## 2020-12-16 DIAGNOSIS — N2581 Secondary hyperparathyroidism of renal origin: Secondary | ICD-10-CM | POA: Diagnosis not present

## 2020-12-16 DIAGNOSIS — Z992 Dependence on renal dialysis: Secondary | ICD-10-CM | POA: Diagnosis not present

## 2020-12-16 DIAGNOSIS — Z23 Encounter for immunization: Secondary | ICD-10-CM | POA: Diagnosis not present

## 2020-12-16 DIAGNOSIS — N186 End stage renal disease: Secondary | ICD-10-CM | POA: Diagnosis not present

## 2020-12-17 NOTE — Telephone Encounter (Signed)
   Primary Cardiologist: Carlyle Dolly, MD  Chart reviewed as part of pre-operative protocol coverage.  Repeat echocardiogram reviewed from 12/15/20 which showed EF 25-30%, up from 15-20% 09/2020. He had [facility] 09/2020 with PCI/DES to LCx with residual mild-moderate disease LAD/ RCA branch vessel which were medically managed.   He is anticipating an upcoming cystoscopy with bladder tumor resection procedure. The request states patient can remain on anticoagulation for this procedure.   Dr. Harl Bowie, this patient is not due to see you until 02/14/21. I would feel more comfortable if he could be seen sometime 01/2021 in anticipation of his procedure 01/31/21. Are you able to squeeze him in sooner than his planned visit? If not, I can try to coordinate a visit with an APP. Thanks in advance!   Please route your response back to P CV DIV PREOP.    Abigail Butts, PA-C 12/17/2020, 11:56 AM

## 2020-12-18 DIAGNOSIS — Z992 Dependence on renal dialysis: Secondary | ICD-10-CM | POA: Diagnosis not present

## 2020-12-18 DIAGNOSIS — N186 End stage renal disease: Secondary | ICD-10-CM | POA: Diagnosis not present

## 2020-12-18 DIAGNOSIS — N2581 Secondary hyperparathyroidism of renal origin: Secondary | ICD-10-CM | POA: Diagnosis not present

## 2020-12-18 DIAGNOSIS — Z23 Encounter for immunization: Secondary | ICD-10-CM | POA: Diagnosis not present

## 2020-12-18 DIAGNOSIS — D689 Coagulation defect, unspecified: Secondary | ICD-10-CM | POA: Diagnosis not present

## 2020-12-21 ENCOUNTER — Other Ambulatory Visit: Payer: Self-pay

## 2020-12-21 DIAGNOSIS — Z992 Dependence on renal dialysis: Secondary | ICD-10-CM | POA: Diagnosis not present

## 2020-12-21 DIAGNOSIS — N186 End stage renal disease: Secondary | ICD-10-CM | POA: Diagnosis not present

## 2020-12-21 DIAGNOSIS — D689 Coagulation defect, unspecified: Secondary | ICD-10-CM | POA: Diagnosis not present

## 2020-12-21 DIAGNOSIS — Z23 Encounter for immunization: Secondary | ICD-10-CM | POA: Diagnosis not present

## 2020-12-21 DIAGNOSIS — N2581 Secondary hyperparathyroidism of renal origin: Secondary | ICD-10-CM | POA: Diagnosis not present

## 2020-12-21 NOTE — Telephone Encounter (Signed)
Ill have my office add him on 01/18/21 at 220 pm, kisha can you please add this patient on for me  Carlyle Dolly MD

## 2020-12-22 ENCOUNTER — Ambulatory Visit (INDEPENDENT_AMBULATORY_CARE_PROVIDER_SITE_OTHER): Payer: Medicare Other | Admitting: Cardiology

## 2020-12-22 ENCOUNTER — Encounter: Payer: Self-pay | Admitting: Cardiology

## 2020-12-22 ENCOUNTER — Other Ambulatory Visit: Payer: Self-pay

## 2020-12-22 VITALS — BP 126/68 | HR 76 | Ht 69.0 in | Wt 151.4 lb

## 2020-12-22 DIAGNOSIS — I4892 Unspecified atrial flutter: Secondary | ICD-10-CM

## 2020-12-22 DIAGNOSIS — I5022 Chronic systolic (congestive) heart failure: Secondary | ICD-10-CM

## 2020-12-22 DIAGNOSIS — Z01810 Encounter for preprocedural cardiovascular examination: Secondary | ICD-10-CM | POA: Diagnosis not present

## 2020-12-22 DIAGNOSIS — I2510 Atherosclerotic heart disease of native coronary artery without angina pectoris: Secondary | ICD-10-CM

## 2020-12-22 MED ORDER — METOPROLOL SUCCINATE ER 25 MG PO TB24: 12.5000 mg | ORAL_TABLET | Freq: Every day | ORAL | 3 refills | Status: DC

## 2020-12-22 NOTE — Patient Instructions (Signed)
Medication Instructions:  Your physician has recommended you make the following change in your medication:   Decrease Toprol XL to 12.5 mg Daily   *If you need a refill on your cardiac medications before your next appointment, please call your pharmacy*   Lab Work: NONE   If you have labs (blood work) drawn today and your tests are completely normal, you will receive your results only by: [name] MyChart Message (if you have MyChart) OR . A paper copy in the mail If you have any lab test that is abnormal or we need to change your treatment, we will call you to review the results.   Testing/Procedures: NONE    Follow-Up: At [facility], you and your health needs are our priority.  As part of our continuing mission to provide you with exceptional heart care, we have created designated Provider Care Teams.  These Care Teams include your primary Cardiologist (physician) and Advanced Practice Providers (APPs -  Physician Assistants and Nurse Practitioners) who all work together to provide you with the care you need, when you need it.  We recommend signing up for the patient portal called "MyChart".  Sign up information is provided on this After Visit Summary.  MyChart is used to connect with patients for Virtual Visits (Telemedicine).  Patients are able to view lab/test results, encounter notes, upcoming appointments, etc.  Non-urgent messages can be sent to your provider as well.   To learn more about what you can do with MyChart, go to [email or web address].    Your next appointment:   6 week(s)  The format for your next appointment:   In Person  Provider:   You may see Carlyle Dolly, MD or one of the following Advanced Practice Providers on your designated Care Team:    Bernerd Pho, PA-C   Ermalinda Barrios, [place]     Other Instructions Thank you for choosing [facility]!

## 2020-12-22 NOTE — Telephone Encounter (Signed)
Pt has appt today with Dr. Harl Bowie at 10:00 am.

## 2020-12-22 NOTE — Progress Notes (Signed)
Clinical Summary Jeremy Johnson is a 75 y.o.male seen today for follow up of the following medical problems.   1. CAD - prior stent to LCX in 1997 - 09/2020 DES x2 to LCX. Had some hemorroidal bleeing on triple therapy, plan for eliquis and plavix for one year  - no recent chest pains. No SOB or DOE - compliant with meds   2. Chronic systolic HF - new diagnosis during 09/2020 admission - 09/2020 echo LVEF 15-20%, grade III dd, mild RV dysfunction -12/2020 echo LVEF 25-30%, normal RV function  - low bp's on HD had limited medical therapy. Nephrology increasing dry weight but ongoing issue  - no recent edema - he is hesitant for ICD consideration, favors ongoing medical therapy.   3. ESRD - low bp's on HD  4. Aflutter - s/p TEE/DCCV during 09/2020 admission - started on amio  - no recent palpitaitons.   3. Thoracic aneurysm 4.2cm thoracic aortic aneurysm at the arch, 3.9cm at the diaphragm and 3cm at the infrarenal aorta - Penetrating atherosclerotic ulcer noted along the undersurface of the aortic arch adjacent to the area of maximal dilation. This demonstrates interval decrease in size with increasing mural thrombus when compared to prior examination.  - followed by CT surgery  4. Lung cancer  5. Bladder tumor - considering cystocscopy with tumor removal, urology Dr Alyson Ingles - mild hematuria Past Medical History:  Diagnosis Date  . Anemia   . Blood transfusion without reported diagnosis   . CAD (coronary artery disease)    STENT... MID CIRCUMFLEX...1997  . Chronic kidney disease    STAGE 3  . COPD (chronic obstructive pulmonary disease) ([place])   . Degenerative joint disease (DJD) of lumbar spine   . GERD (gastroesophageal reflux disease)   . Gout   . Hyperlipidemia   . Hypertension   . Hypothyroidism   . Incisional hernia    abdomen  . Leukocytosis    CHRONIC MILD  . Myocardial infarction ([place])    1997  . SCL CA dx'd 01/2019   Lung cancer      Allergies  Allergen Reactions  . Advair Hfa [Fluticasone-Salmeterol] Other (See Comments)    Developed thrush, although the mouth WAS being rinsed as directed  . Penicillins Rash    Has patient had a PCN reaction causing immediate rash, facial/tongue/throat swelling, SOB or lightheadedness with hypotension: No Has patient had a PCN reaction causing severe rash involving mucus membranes or skin necrosis: No Has patient had a PCN reaction that required hospitalization: No Has patient had a PCN reaction occurring within the last 10 years: No If all of the above answers are "NO", then may proceed with Cephalosporin use.      Current Outpatient Medications  Medication Sig Dispense Refill  . acetaminophen (TYLENOL) 325 MG tablet Take 2 tablets (650 mg total) by mouth every 6 (six) hours as needed for mild pain (or Fever >/= 101). (Patient taking differently: Take 1,300 mg by mouth every 6 (six) hours as needed for mild pain or headache (or Fever >/= 101). Arthritis strength) 30 tablet 0  . albuterol (VENTOLIN HFA) 108 (90 Base) MCG/ACT inhaler Inhale 2 puffs into the lungs every 6 (six) hours as needed for wheezing or shortness of breath.     . allopurinol (ZYLOPRIM) 100 MG tablet Take 1 tablet (100 mg total) by mouth daily. (Patient taking differently: Take 100 mg by mouth in the morning.) 30 tablet 1  . amiodarone (PACERONE) 200 MG tablet  Take 1 tablet (200 mg total) by mouth daily. 90 tablet 3  . atorvastatin (LIPITOR) 40 MG tablet TAKE 1 TABLET BY MOUTH  DAILY 60 tablet 5  . B Complex-C-Zn-Folic Acid (DIALYVITE 322-GURK 15) 0.8 MG TABS Take 1 tablet by mouth daily.    . cephALEXin (KEFLEX) 250 MG capsule Take 1 capsule (250 mg total) by mouth 2 (two) times daily. 10 capsule 0  . clopidogrel (PLAVIX) 75 MG tablet TAKE 1 TABLET BY MOUTH  DAILY WITH BREAKFAST 60 tablet 5  . Darbepoetin Alfa (ARANESP) 100 MCG/0.5ML SOSY injection Inject 0.5 mLs (100 mcg total) into the vein every Thursday  with hemodialysis. 4.2 mL   . ELIQUIS 2.5 MG TABS tablet TAKE 1 TABLET BY MOUTH  TWICE DAILY 180 tablet 3  . iron sucrose in sodium chloride 0.9 % 100 mL 50 mg.    . levocetirizine (XYZAL) 5 MG tablet Take 5 mg by mouth daily.    [name] levothyroxine (SYNTHROID, LEVOTHROID) 175 MCG tablet Take 175 mcg by mouth daily before breakfast.     . lidocaine-prilocaine (EMLA) cream SMARTSIG:1 Topical Every Night    . Methoxy PEG-Epoetin Beta (MIRCERA IJ) Inject 30 mg into the vein every 28 (twenty-eight) days.     . metoprolol succinate (TOPROL-XL) 25 MG 24 hr tablet Take 1 tablet (25 mg total) by mouth daily. 30 tablet 1  . metoprolol tartrate (LOPRESSOR) 50 MG tablet Take 50 mg by mouth daily.    [name] omeprazole (PRILOSEC) 40 MG capsule Take 1 capsule (40 mg total) by mouth in the morning and at bedtime. (Patient taking differently: Take 40 mg by mouth daily.) 60 capsule 5  . pramipexole (MIRAPEX) 0.25 MG tablet Take 0.25 mg by mouth at bedtime.     No current facility-administered medications for this visit.     Past Surgical History:  Procedure Laterality Date  . ABDOMINAL AORTIC ANEURYSM REPAIR  2006  . AV FISTULA PLACEMENT Left 04/25/2019   Procedure: ARTERIOVENOUS (AV) FISTULA CREATION LEFT ARM;  Surgeon: Angelia Mould, MD;  Location: [facility];  Service: Vascular;  Laterality: Left;  . AV FISTULA PLACEMENT Left 05/19/2019   Procedure: CONVERSION OF LEFT ARM ARTERIOVENOUS FISTULA TO GRAFT;  Surgeon: Angelia Mould, MD;  Location: [facility];  Service: Vascular;  Laterality: Left;  . BIOPSY  09/30/2018   Procedure: BIOPSY;  Surgeon: Danie Binder, MD;  [facility];  Service: Endoscopy;;  ascending colon  . BIOPSY  07/27/2020   Procedure: BIOPSY;  Surgeon: Eloise Harman, DO;  [facility];  Service: Endoscopy;;  gastric  . BUBBLE STUDY  09/23/2020   Procedure: BUBBLE STUDY;  Surgeon: Skeet Latch, MD;  Location: [facility];  Service: Cardiovascular;;  .  CARDIOVERSION N/A 09/23/2020   Procedure: CARDIOVERSION;  Surgeon: Skeet Latch, MD;  Location: [facility];  Service: Cardiovascular;  Laterality: N/A;  . COLONOSCOPY  2008  . COLONOSCOPY N/A 09/30/2018   External and internal hemorrhoids, six polyps removed, one ascending colon polypoid lesion biopsied. Six simple adenomas and one benign polypoid lesion. Colonoscopy Nov 2022.   [name] COLONOSCOPY WITH PROPOFOL N/A 07/27/2020   non-bleeding internal hemorrhoids, sigmoid and descending colon diverticulosis, four 1-2 mm polyps in ascending colon, one 5 mm polyp in transverse colon. 3 year surveillance. Tubular adenomas.   . CORONARY ANGIOPLASTY WITH STENT PLACEMENT  1997   MID CIRCUMFLEX  . CORONARY STENT INTERVENTION N/A 09/20/2020   Procedure: CORONARY STENT INTERVENTION;  Surgeon: Martinique, Peter M, MD;  Location: [place] [facility];  Service: Cardiovascular;  Laterality: N/A;  . ESOPHAGOGASTRODUODENOSCOPY (EGD) WITH PROPOFOL N/A 07/27/2020   Food in middle third of esophagus, gastritis s/p biopsy, nodular mucosa in lesser curvature of stomach s/p biopsy. Negative H.pylori.   [name] HIP ARTHROPLASTY Right 04/30/2020   Procedure: ARTHROPLASTY  HIP (HEMIARTHROPLASTY);  Surgeon: Altamese Willowbrook, MD;  Location: [place];  Service: Orthopedics;  Laterality: Right;  . INTRAVASCULAR ULTRASOUND/IVUS N/A 09/20/2020   Procedure: Intravascular Ultrasound/IVUS;  Surgeon: Martinique, Peter M, MD;  Location: [place] [facility];  Service: Cardiovascular;  Laterality: N/A;  . IR FLUORO GUIDE CV LINE RIGHT  04/22/2019  . IR FLUORO GUIDE CV LINE RIGHT  05/01/2020  . IR THORACENTESIS ASP PLEURAL SPACE W/IMG GUIDE  09/22/2020  . IR THROMBECTOMY AV FISTULA W/THROMBOLYSIS/PTA INC/SHUNT/IMG LEFT Left 05/03/2020  . IR US GUIDE VASC ACCESS LEFT  05/03/2020  . IR US GUIDE VASC ACCESS RIGHT  04/22/2019  . IR US GUIDE VASC ACCESS RIGHT  05/01/2020  . POLYPECTOMY  09/30/2018   Procedure: POLYPECTOMY;  Surgeon: Danie Binder, MD;  [facility];  Service: Endoscopy;;  colon  . POLYPECTOMY  07/27/2020   Procedure: POLYPECTOMY;  Surgeon: Eloise Harman, DO;  [facility];  Service: Endoscopy;;  . RIGHT/LEFT HEART CATH AND CORONARY ANGIOGRAPHY N/A 09/20/2020   Procedure: RIGHT/LEFT HEART CATH AND CORONARY ANGIOGRAPHY;  Surgeon: Martinique, Peter M, MD;  Location: [place] [facility];  Service: Cardiovascular;  Laterality: N/A;  . TEE WITHOUT CARDIOVERSION N/A 09/23/2020   Procedure: TRANSESOPHAGEAL ECHOCARDIOGRAM (TEE);  Surgeon: Skeet Latch, MD;  Location: [facility];  Service: Cardiovascular;  Laterality: N/A;  . VIDEO BRONCHOSCOPY WITH ENDOBRONCHIAL NAVIGATION N/A 01/27/2019   Procedure: VIDEO BRONCHOSCOPY WITH ENDOBRONCHIAL NAVIGATION;  Surgeon: Grace Isaac, MD;  Location: [facility];  Service: Thoracic;  Laterality: N/A;  . VIDEO BRONCHOSCOPY WITH ENDOBRONCHIAL ULTRASOUND N/A 01/27/2019   Procedure: VIDEO BRONCHOSCOPY WITH ENDOBRONCHIAL ULTRASOUND;  Surgeon: Grace Isaac, MD;  Location: [place];  Service: Thoracic;  Laterality: N/A;     Allergies  Allergen Reactions  . Advair Hfa [Fluticasone-Salmeterol] Other (See Comments)    Developed thrush, although the mouth WAS being rinsed as directed  . Penicillins Rash    Has patient had a PCN reaction causing immediate rash, facial/tongue/throat swelling, SOB or lightheadedness with hypotension: No Has patient had a PCN reaction causing severe rash involving mucus membranes or skin necrosis: No Has patient had a PCN reaction that required hospitalization: No Has patient had a PCN reaction occurring within the last 10 years: No If all of the above answers are "NO", then may proceed with Cephalosporin use.       Family History  Problem Relation Age of Onset  . Stroke Brother   . Lung cancer Sister 9       lung cancer/former  . Colon cancer Neg Hx   . Colon polyps Neg Hx      Social History Jeremy Johnson reports that he quit smoking about 3 years  ago. He has a 27.00 pack-year smoking history. He has never used smokeless tobacco. Jeremy Johnson reports previous alcohol use.   Review of Systems CONSTITUTIONAL: No weight loss, fever, chills, weakness or fatigue.  HEENT: Eyes: No visual loss, blurred vision, double vision or yellow sclerae.No hearing loss, sneezing, congestion, runny nose or sore throat.  SKIN: No rash or itching.  CARDIOVASCULAR: per hpi RESPIRATORY: No shortness of breath, cough or sputum.  GASTROINTESTINAL: No anorexia, nausea,  vomiting or diarrhea. No abdominal pain or blood.  GENITOURINARY: No burning on urination, no polyuria NEUROLOGICAL: No headache, dizziness, syncope, paralysis, ataxia, numbness or tingling in the extremities. No change in bowel or bladder control.  MUSCULOSKELETAL: No muscle, back pain, joint pain or stiffness.  LYMPHATICS: No enlarged nodes. No history of splenectomy.  PSYCHIATRIC: No history of depression or anxiety.  ENDOCRINOLOGIC: No reports of sweating, cold or heat intolerance. No polyuria or polydipsia.  [name]   Physical Examination Today's Vitals   12/22/20 0944  BP: 126/68  Pulse: 76  SpO2: 97%  Weight: 151 lb 6.4 oz (68.7 kg)  Height: 5\' 9"  (1.753 m)   Body mass index is 22.36 kg/m.  Gen: resting comfortably, no acute distress HEENT: no scleral icterus, pupils equal round and reactive, no palptable cervical adenopathy,  CV: RRR, no m/r/g, no jvd Resp: Clear to auscultation bilaterally GI: abdomen is soft, non-tender, non-distended, normal bowel sounds, no hepatosplenomegaly MSK: extremities are warm, no edema.  Skin: warm, no rash Neuro:  no focal deficits Psych: appropriate affect   Diagnostic Studies [facility] 09/20/2020  Prox LAD to Mid LAD lesion is 40% stenosed.  Mid Cx lesion is 50% stenosed.  Mid Cx to Dist Cx lesion is 90% stenosed.  Prox Cx lesion is 50% stenosed.  RV Khrystal Jeanmarie lesion is 50% stenosed.  A drug-eluting stent was successfully placed using a STENT  RESOLUTE ONYX 3.0X34.  Post intervention, there is a 0% residual stenosis.  Post intervention, there is a 0% residual stenosis.  Post intervention, there is a 0% residual stenosis.  A drug-eluting stent was successfully placed using a STENT RESOLUTE ONYX 4.0X18.  LV end diastolic pressure is normal.  Hemodynamic findings consistent with mild pulmonary hypertension.  1. Left dominant circulation 2. Severe single vessel obstructive CAD involving the proximal to mid LCx 3. Normal LV filling pressures 4. Mild pulmonary HTN 5. Normal cardiac output 6. Successful PCI of the proximal to mid LCx with DES x 2 overlapping. Lesion modified with cutting balloon angioplasty and Shockwave therapy.   Plan: DAPT for at least one year. Optimize medical therapy for CHF.   TTE 09/17/2020   1. Left ventricular ejection fraction, by estimation, is 15-20%. The left  ventricle has severely decreased function. The left ventricle demonstrates  global hypokinesis. The left ventricular internal cavity size was mildly  dilated. Left ventricular  diastolic parameters are consistent with Grade III diastolic dysfunction  (restrictive). Elevated left atrial pressure.  2. Right ventricular systolic function is mildly reduced. The right  ventricular size is normal.  3. Left atrial size was severely dilated.  4. Right atrial size was severely dilated.  5. Large pleural effusion in the left lateral region.  6. The mitral valve is normal in structure. Mild mitral valve  regurgitation. No evidence of mitral stenosis.  7. The aortic valve is tricuspid. Aortic valve regurgitation is not  visualized. No aortic stenosis is present.  8. The inferior vena cava is dilated in size with >50% respiratory  variability, suggesting right atrial pressure of 8 mmHg.     Assessment and Plan  1. CAD - no recent symptoms - continue medical therapy  2. Chronic systolic HF - euvolemic, volume status controlled  with HD - on max tolerated medical therapy due to low bp's with HD. Will try lowering toprol to 12.5mg  daily  3. Aflutter - no symptoms, continue current meds  4. Preoperative evaluation - considering bladder surgery - symptoms wise no contraindication. Will touch base  with urology regarding his eliquis/plavix. If surgery could be delayed to May would be 6 months since stent and lower risk. If surgery is neccesary sooner, reasonable to hold plavix but will be some higher risk        Arnoldo Lenis, M.D.

## 2020-12-23 DIAGNOSIS — N186 End stage renal disease: Secondary | ICD-10-CM | POA: Diagnosis not present

## 2020-12-23 DIAGNOSIS — D689 Coagulation defect, unspecified: Secondary | ICD-10-CM | POA: Diagnosis not present

## 2020-12-23 DIAGNOSIS — Z23 Encounter for immunization: Secondary | ICD-10-CM | POA: Diagnosis not present

## 2020-12-23 DIAGNOSIS — Z992 Dependence on renal dialysis: Secondary | ICD-10-CM | POA: Diagnosis not present

## 2020-12-23 DIAGNOSIS — N2581 Secondary hyperparathyroidism of renal origin: Secondary | ICD-10-CM | POA: Diagnosis not present

## 2020-12-25 DIAGNOSIS — N186 End stage renal disease: Secondary | ICD-10-CM | POA: Diagnosis not present

## 2020-12-25 DIAGNOSIS — Z23 Encounter for immunization: Secondary | ICD-10-CM | POA: Diagnosis not present

## 2020-12-25 DIAGNOSIS — Z992 Dependence on renal dialysis: Secondary | ICD-10-CM | POA: Diagnosis not present

## 2020-12-25 DIAGNOSIS — N2581 Secondary hyperparathyroidism of renal origin: Secondary | ICD-10-CM | POA: Diagnosis not present

## 2020-12-25 DIAGNOSIS — D689 Coagulation defect, unspecified: Secondary | ICD-10-CM | POA: Diagnosis not present

## 2020-12-28 DIAGNOSIS — Z992 Dependence on renal dialysis: Secondary | ICD-10-CM | POA: Diagnosis not present

## 2020-12-28 DIAGNOSIS — D689 Coagulation defect, unspecified: Secondary | ICD-10-CM | POA: Diagnosis not present

## 2020-12-28 DIAGNOSIS — N186 End stage renal disease: Secondary | ICD-10-CM | POA: Diagnosis not present

## 2020-12-28 DIAGNOSIS — N2581 Secondary hyperparathyroidism of renal origin: Secondary | ICD-10-CM | POA: Diagnosis not present

## 2020-12-28 DIAGNOSIS — Z23 Encounter for immunization: Secondary | ICD-10-CM | POA: Diagnosis not present

## 2020-12-30 DIAGNOSIS — N186 End stage renal disease: Secondary | ICD-10-CM | POA: Diagnosis not present

## 2020-12-30 DIAGNOSIS — Z992 Dependence on renal dialysis: Secondary | ICD-10-CM | POA: Diagnosis not present

## 2020-12-30 DIAGNOSIS — D689 Coagulation defect, unspecified: Secondary | ICD-10-CM | POA: Diagnosis not present

## 2020-12-30 DIAGNOSIS — N2581 Secondary hyperparathyroidism of renal origin: Secondary | ICD-10-CM | POA: Diagnosis not present

## 2020-12-30 DIAGNOSIS — Z23 Encounter for immunization: Secondary | ICD-10-CM | POA: Diagnosis not present

## 2021-01-01 DIAGNOSIS — D689 Coagulation defect, unspecified: Secondary | ICD-10-CM | POA: Diagnosis not present

## 2021-01-01 DIAGNOSIS — N2581 Secondary hyperparathyroidism of renal origin: Secondary | ICD-10-CM | POA: Diagnosis not present

## 2021-01-01 DIAGNOSIS — Z992 Dependence on renal dialysis: Secondary | ICD-10-CM | POA: Diagnosis not present

## 2021-01-01 DIAGNOSIS — N186 End stage renal disease: Secondary | ICD-10-CM | POA: Diagnosis not present

## 2021-01-01 DIAGNOSIS — Z23 Encounter for immunization: Secondary | ICD-10-CM | POA: Diagnosis not present

## 2021-01-02 DIAGNOSIS — J449 Chronic obstructive pulmonary disease, unspecified: Secondary | ICD-10-CM | POA: Diagnosis not present

## 2021-01-03 DIAGNOSIS — R0982 Postnasal drip: Secondary | ICD-10-CM | POA: Diagnosis not present

## 2021-01-03 DIAGNOSIS — I5022 Chronic systolic (congestive) heart failure: Secondary | ICD-10-CM | POA: Diagnosis not present

## 2021-01-03 DIAGNOSIS — C3490 Malignant neoplasm of unspecified part of unspecified bronchus or lung: Secondary | ICD-10-CM | POA: Diagnosis not present

## 2021-01-04 DIAGNOSIS — Z992 Dependence on renal dialysis: Secondary | ICD-10-CM | POA: Diagnosis not present

## 2021-01-04 DIAGNOSIS — N2581 Secondary hyperparathyroidism of renal origin: Secondary | ICD-10-CM | POA: Diagnosis not present

## 2021-01-04 DIAGNOSIS — D689 Coagulation defect, unspecified: Secondary | ICD-10-CM | POA: Diagnosis not present

## 2021-01-04 DIAGNOSIS — Z23 Encounter for immunization: Secondary | ICD-10-CM | POA: Diagnosis not present

## 2021-01-04 DIAGNOSIS — N186 End stage renal disease: Secondary | ICD-10-CM | POA: Diagnosis not present

## 2021-01-05 ENCOUNTER — Other Ambulatory Visit: Payer: Self-pay | Admitting: Cardiology

## 2021-01-06 DIAGNOSIS — N186 End stage renal disease: Secondary | ICD-10-CM | POA: Diagnosis not present

## 2021-01-06 DIAGNOSIS — N2581 Secondary hyperparathyroidism of renal origin: Secondary | ICD-10-CM | POA: Diagnosis not present

## 2021-01-06 DIAGNOSIS — D689 Coagulation defect, unspecified: Secondary | ICD-10-CM | POA: Diagnosis not present

## 2021-01-06 DIAGNOSIS — Z992 Dependence on renal dialysis: Secondary | ICD-10-CM | POA: Diagnosis not present

## 2021-01-06 DIAGNOSIS — Z23 Encounter for immunization: Secondary | ICD-10-CM | POA: Diagnosis not present

## 2021-01-08 DIAGNOSIS — N2581 Secondary hyperparathyroidism of renal origin: Secondary | ICD-10-CM | POA: Diagnosis not present

## 2021-01-08 DIAGNOSIS — D689 Coagulation defect, unspecified: Secondary | ICD-10-CM | POA: Diagnosis not present

## 2021-01-08 DIAGNOSIS — Z992 Dependence on renal dialysis: Secondary | ICD-10-CM | POA: Diagnosis not present

## 2021-01-08 DIAGNOSIS — Z23 Encounter for immunization: Secondary | ICD-10-CM | POA: Diagnosis not present

## 2021-01-08 DIAGNOSIS — N186 End stage renal disease: Secondary | ICD-10-CM | POA: Diagnosis not present

## 2021-01-10 ENCOUNTER — Telehealth: Payer: Self-pay

## 2021-01-10 DIAGNOSIS — N186 End stage renal disease: Secondary | ICD-10-CM | POA: Diagnosis not present

## 2021-01-10 DIAGNOSIS — I129 Hypertensive chronic kidney disease with stage 1 through stage 4 chronic kidney disease, or unspecified chronic kidney disease: Secondary | ICD-10-CM | POA: Diagnosis not present

## 2021-01-10 DIAGNOSIS — Z992 Dependence on renal dialysis: Secondary | ICD-10-CM | POA: Diagnosis not present

## 2021-01-10 NOTE — Telephone Encounter (Signed)
Patient wishes to move surgery date to May after speaking with his cardiologist.  New surgery date requested for May.  Dr. Alyson Ingles notified.

## 2021-01-11 DIAGNOSIS — N186 End stage renal disease: Secondary | ICD-10-CM | POA: Diagnosis not present

## 2021-01-11 DIAGNOSIS — D689 Coagulation defect, unspecified: Secondary | ICD-10-CM | POA: Diagnosis not present

## 2021-01-11 DIAGNOSIS — N2581 Secondary hyperparathyroidism of renal origin: Secondary | ICD-10-CM | POA: Diagnosis not present

## 2021-01-11 DIAGNOSIS — Z992 Dependence on renal dialysis: Secondary | ICD-10-CM | POA: Diagnosis not present

## 2021-01-13 DIAGNOSIS — N186 End stage renal disease: Secondary | ICD-10-CM | POA: Diagnosis not present

## 2021-01-13 DIAGNOSIS — N2581 Secondary hyperparathyroidism of renal origin: Secondary | ICD-10-CM | POA: Diagnosis not present

## 2021-01-13 DIAGNOSIS — Z992 Dependence on renal dialysis: Secondary | ICD-10-CM | POA: Diagnosis not present

## 2021-01-13 DIAGNOSIS — D689 Coagulation defect, unspecified: Secondary | ICD-10-CM | POA: Diagnosis not present

## 2021-01-13 DIAGNOSIS — R7989 Other specified abnormal findings of blood chemistry: Secondary | ICD-10-CM | POA: Diagnosis not present

## 2021-01-13 LAB — HEPATIC FUNCTION PANEL
AG Ratio: 1 (calc) (ref 1.0–2.5)
ALT: 14 U/L (ref 9–46)
AST: 22 U/L (ref 10–35)
Albumin: 3.8 g/dL (ref 3.6–5.1)
Alkaline phosphatase (APISO): 105 U/L (ref 35–144)
Bilirubin, Direct: 0.1 mg/dL (ref 0.0–0.2)
Globulin: 3.9 g/dL (calc) — ABNORMAL HIGH (ref 1.9–3.7)
Indirect Bilirubin: 0.4 mg/dL (calc) (ref 0.2–1.2)
Total Bilirubin: 0.5 mg/dL (ref 0.2–1.2)
Total Protein: 7.7 g/dL (ref 6.1–8.1)

## 2021-01-14 NOTE — Progress Notes (Signed)
Pt sent a letter regarding his HFP being normal.

## 2021-01-14 NOTE — Progress Notes (Signed)
Dear Mr. Winchell   Your HFP (Hepatic Function Panel) is now completely normal.    Please give [place] a call with any questions or concerns.    Thank you, Floria Raveling, CMA

## 2021-01-15 DIAGNOSIS — N2581 Secondary hyperparathyroidism of renal origin: Secondary | ICD-10-CM | POA: Diagnosis not present

## 2021-01-15 DIAGNOSIS — Z992 Dependence on renal dialysis: Secondary | ICD-10-CM | POA: Diagnosis not present

## 2021-01-15 DIAGNOSIS — D689 Coagulation defect, unspecified: Secondary | ICD-10-CM | POA: Diagnosis not present

## 2021-01-15 DIAGNOSIS — N186 End stage renal disease: Secondary | ICD-10-CM | POA: Diagnosis not present

## 2021-01-17 ENCOUNTER — Ambulatory Visit (HOSPITAL_COMMUNITY)
Admission: RE | Admit: 2021-01-17 | Discharge: 2021-01-17 | Disposition: A | Discharge status: Home / Self Care | Payer: Medicare Other | Source: Ambulatory Visit | Attending: Internal Medicine | Admitting: Internal Medicine

## 2021-01-17 ENCOUNTER — Other Ambulatory Visit: Payer: Self-pay

## 2021-01-17 ENCOUNTER — Telehealth: Payer: Self-pay

## 2021-01-17 ENCOUNTER — Inpatient Hospital Stay: Payer: Medicare Other | Attending: Internal Medicine

## 2021-01-17 DIAGNOSIS — C3411 Malignant neoplasm of upper lobe, right bronchus or lung: Secondary | ICD-10-CM | POA: Diagnosis not present

## 2021-01-17 DIAGNOSIS — E039 Hypothyroidism, unspecified: Secondary | ICD-10-CM | POA: Diagnosis not present

## 2021-01-17 DIAGNOSIS — C3490 Malignant neoplasm of unspecified part of unspecified bronchus or lung: Secondary | ICD-10-CM | POA: Diagnosis not present

## 2021-01-17 DIAGNOSIS — Z79899 Other long term (current) drug therapy: Secondary | ICD-10-CM | POA: Diagnosis not present

## 2021-01-17 DIAGNOSIS — Z880 Allergy status to penicillin: Secondary | ICD-10-CM | POA: Insufficient documentation

## 2021-01-17 DIAGNOSIS — Z7901 Long term (current) use of anticoagulants: Secondary | ICD-10-CM | POA: Insufficient documentation

## 2021-01-17 DIAGNOSIS — E785 Hyperlipidemia, unspecified: Secondary | ICD-10-CM | POA: Diagnosis not present

## 2021-01-17 DIAGNOSIS — I2510 Atherosclerotic heart disease of native coronary artery without angina pectoris: Secondary | ICD-10-CM | POA: Diagnosis not present

## 2021-01-17 DIAGNOSIS — I120 Hypertensive chronic kidney disease with stage 5 chronic kidney disease or end stage renal disease: Secondary | ICD-10-CM | POA: Insufficient documentation

## 2021-01-17 DIAGNOSIS — Z992 Dependence on renal dialysis: Secondary | ICD-10-CM | POA: Insufficient documentation

## 2021-01-17 DIAGNOSIS — N186 End stage renal disease: Secondary | ICD-10-CM | POA: Insufficient documentation

## 2021-01-17 DIAGNOSIS — I700 Atherosclerosis of aorta: Secondary | ICD-10-CM | POA: Insufficient documentation

## 2021-01-17 DIAGNOSIS — J439 Emphysema, unspecified: Secondary | ICD-10-CM | POA: Diagnosis not present

## 2021-01-17 DIAGNOSIS — D61818 Other pancytopenia: Secondary | ICD-10-CM | POA: Insufficient documentation

## 2021-01-17 DIAGNOSIS — Z8719 Personal history of other diseases of the digestive system: Secondary | ICD-10-CM | POA: Diagnosis not present

## 2021-01-17 DIAGNOSIS — Z86010 Personal history of colonic polyps: Secondary | ICD-10-CM | POA: Diagnosis not present

## 2021-01-17 DIAGNOSIS — I252 Old myocardial infarction: Secondary | ICD-10-CM | POA: Diagnosis not present

## 2021-01-17 DIAGNOSIS — I712 Thoracic aortic aneurysm, without rupture: Secondary | ICD-10-CM | POA: Diagnosis not present

## 2021-01-17 DIAGNOSIS — C3412 Malignant neoplasm of upper lobe, left bronchus or lung: Secondary | ICD-10-CM | POA: Diagnosis not present

## 2021-01-17 DIAGNOSIS — I4891 Unspecified atrial fibrillation: Secondary | ICD-10-CM | POA: Insufficient documentation

## 2021-01-17 DIAGNOSIS — K219 Gastro-esophageal reflux disease without esophagitis: Secondary | ICD-10-CM | POA: Insufficient documentation

## 2021-01-17 LAB — CBC WITH DIFFERENTIAL (CANCER CENTER ONLY)
Abs Immature Granulocytes: 0.04 10*3/uL (ref 0.00–0.07)
Basophils Absolute: 0.1 10*3/uL (ref 0.0–0.1)
Basophils Relative: 1 %
Eosinophils Absolute: 0.3 10*3/uL (ref 0.0–0.5)
Eosinophils Relative: 4 %
HCT: 42.5 % (ref 39.0–52.0)
Hemoglobin: 13.9 g/dL (ref 13.0–17.0)
Immature Granulocytes: 1 %
Lymphocytes Relative: 22 %
Lymphs Abs: 1.7 10*3/uL (ref 0.7–4.0)
MCH: 33.3 pg (ref 26.0–34.0)
MCHC: 32.7 g/dL (ref 30.0–36.0)
MCV: 101.9 fL — ABNORMAL HIGH (ref 80.0–100.0)
Monocytes Absolute: 1 10*3/uL (ref 0.1–1.0)
Monocytes Relative: 13 %
Neutro Abs: 4.6 10*3/uL (ref 1.7–7.7)
Neutrophils Relative %: 59 %
Platelet Count: 164 10*3/uL (ref 150–400)
RBC: 4.17 MIL/uL — ABNORMAL LOW (ref 4.22–5.81)
RDW: 16.5 % — ABNORMAL HIGH (ref 11.5–15.5)
WBC Count: 7.7 10*3/uL (ref 4.0–10.5)
nRBC: 0 % (ref 0.0–0.2)

## 2021-01-17 LAB — CMP (CANCER CENTER ONLY)
ALT: 12 U/L (ref 0–44)
AST: 18 U/L (ref 15–41)
Albumin: 3.1 g/dL — ABNORMAL LOW (ref 3.5–5.0)
Alkaline Phosphatase: 85 U/L (ref 38–126)
Anion gap: 12 (ref 5–15)
BUN: 34 mg/dL — ABNORMAL HIGH (ref 8–23)
CO2: 28 mmol/L (ref 22–32)
Calcium: 8.8 mg/dL — ABNORMAL LOW (ref 8.9–10.3)
Chloride: 96 mmol/L — ABNORMAL LOW (ref 98–111)
Creatinine: 9.27 mg/dL (ref 0.61–1.24)
GFR, Estimated: 5 mL/min — ABNORMAL LOW (ref >60)
Glucose, Bld: 93 mg/dL (ref 70–99)
Potassium: 4.8 mmol/L (ref 3.5–5.1)
Sodium: 136 mmol/L (ref 135–145)
Total Bilirubin: 0.6 mg/dL (ref 0.3–1.2)
Total Protein: 7.5 g/dL (ref 6.5–8.1)

## 2021-01-17 NOTE — Telephone Encounter (Signed)
CRITICAL VALUE STICKER  CRITICAL VALUE: creatinine = 9.27  RECEIVER (on-site recipient of call): Yetta Glassman, [place] NOTIFIED: 01/17/21 at 10:35am  MESSENGER (representative from lab): Ulice Dash  MD NOTIFIED: Dr. Julien Nordmann  TIME OF NOTIFICATION: 01/17/21 at 10:38am  RESPONSE: Notification given to Dr. Julien Nordmann who advised pt is on dialysis. He  Plan to see pt at his follow-up appt tomorrow 01/18/21. Pt is having his CT scan today.

## 2021-01-18 ENCOUNTER — Ambulatory Visit: Payer: Medicare Other | Admitting: Internal Medicine

## 2021-01-18 ENCOUNTER — Inpatient Hospital Stay (HOSPITAL_BASED_OUTPATIENT_CLINIC_OR_DEPARTMENT_OTHER): Payer: Medicare Other | Admitting: Internal Medicine

## 2021-01-18 VITALS — BP 109/62 | HR 78 | Temp 97.6°F | Resp 15 | Ht 69.0 in | Wt 156.0 lb

## 2021-01-18 DIAGNOSIS — E785 Hyperlipidemia, unspecified: Secondary | ICD-10-CM | POA: Diagnosis not present

## 2021-01-18 DIAGNOSIS — C3411 Malignant neoplasm of upper lobe, right bronchus or lung: Secondary | ICD-10-CM | POA: Diagnosis not present

## 2021-01-18 DIAGNOSIS — Z992 Dependence on renal dialysis: Secondary | ICD-10-CM | POA: Diagnosis not present

## 2021-01-18 DIAGNOSIS — J439 Emphysema, unspecified: Secondary | ICD-10-CM | POA: Diagnosis not present

## 2021-01-18 DIAGNOSIS — I4891 Unspecified atrial fibrillation: Secondary | ICD-10-CM | POA: Diagnosis not present

## 2021-01-18 DIAGNOSIS — E039 Hypothyroidism, unspecified: Secondary | ICD-10-CM | POA: Diagnosis not present

## 2021-01-18 DIAGNOSIS — Z8719 Personal history of other diseases of the digestive system: Secondary | ICD-10-CM | POA: Diagnosis not present

## 2021-01-18 DIAGNOSIS — I120 Hypertensive chronic kidney disease with stage 5 chronic kidney disease or end stage renal disease: Secondary | ICD-10-CM | POA: Diagnosis not present

## 2021-01-18 DIAGNOSIS — D689 Coagulation defect, unspecified: Secondary | ICD-10-CM | POA: Diagnosis not present

## 2021-01-18 DIAGNOSIS — I700 Atherosclerosis of aorta: Secondary | ICD-10-CM | POA: Diagnosis not present

## 2021-01-18 DIAGNOSIS — N186 End stage renal disease: Secondary | ICD-10-CM | POA: Diagnosis not present

## 2021-01-18 DIAGNOSIS — Z86010 Personal history of colonic polyps: Secondary | ICD-10-CM | POA: Diagnosis not present

## 2021-01-18 DIAGNOSIS — Z79899 Other long term (current) drug therapy: Secondary | ICD-10-CM | POA: Diagnosis not present

## 2021-01-18 DIAGNOSIS — I2510 Atherosclerotic heart disease of native coronary artery without angina pectoris: Secondary | ICD-10-CM | POA: Diagnosis not present

## 2021-01-18 DIAGNOSIS — Z7901 Long term (current) use of anticoagulants: Secondary | ICD-10-CM | POA: Diagnosis not present

## 2021-01-18 DIAGNOSIS — C3490 Malignant neoplasm of unspecified part of unspecified bronchus or lung: Secondary | ICD-10-CM

## 2021-01-18 DIAGNOSIS — K219 Gastro-esophageal reflux disease without esophagitis: Secondary | ICD-10-CM | POA: Diagnosis not present

## 2021-01-18 DIAGNOSIS — Z880 Allergy status to penicillin: Secondary | ICD-10-CM | POA: Diagnosis not present

## 2021-01-18 DIAGNOSIS — I252 Old myocardial infarction: Secondary | ICD-10-CM | POA: Diagnosis not present

## 2021-01-18 DIAGNOSIS — N2581 Secondary hyperparathyroidism of renal origin: Secondary | ICD-10-CM | POA: Diagnosis not present

## 2021-01-18 DIAGNOSIS — D61818 Other pancytopenia: Secondary | ICD-10-CM | POA: Diagnosis not present

## 2021-01-18 NOTE — Progress Notes (Signed)
[facility] [phone]   [phone]  OFFICE PROGRESS NOTE  [name], MD [address] [place] [address]  DIAGNOSIS: Limited stage (T2b, N1, M0) small cell lung cancer presented with right upper lobe with invasion of superior segment of right lower lobe as well as right hilar lymphadenopathy diagnosed in March 2020.  PRIOR THERAPY:  Systemic chemotherapy with carboplatin for AUC of 5 on day 1 and etoposide 100 mg/M2 on days 1, 2 and 3 every 3 weeks.  Status post 4 cycles.  CURRENT THERAPY: Observation.  INTERVAL HISTORY: Jeremy Johnson 75 y.o. male returns to the clinic today for follow-up visit accompanied by his wife.  The patient was diagnosed in December 2021 with atrial fibrillation and he was a started on anticoagulation with Eliquis.  He denied having any current chest pain, shortness of breath, cough or hemoptysis.  He has less fatigue than before.  He was also recently diagnosed with superficial bladder cancer and expected to have resection in few months after holding his anticoagulation.  The patient denied having any nausea, vomiting, diarrhea or constipation.  He denied having any headache or visual changes.  He is here today for evaluation with repeat CT scan of the chest for restaging of his disease.  MEDICAL HISTORY: Past Medical History:  Diagnosis Date  . Anemia   . Blood transfusion without reported diagnosis   . CAD (coronary artery disease)    STENT... MID CIRCUMFLEX...1997  . Chronic kidney disease    STAGE 3  . COPD (chronic obstructive pulmonary disease) ([place])   . Degenerative joint disease (DJD) of lumbar spine   . GERD (gastroesophageal reflux disease)   . Gout   . Hyperlipidemia   . Hypertension   . Hypothyroidism   . Incisional hernia    abdomen  . Leukocytosis    CHRONIC MILD  . Myocardial infarction ([place])    1997  . SCL CA dx'd 01/2019   Lung cancer    ALLERGIES:  is allergic to advair hfa  [fluticasone-salmeterol] and penicillins.  MEDICATIONS:  Current Outpatient Medications  Medication Sig Dispense Refill  . acetaminophen (TYLENOL) 325 MG tablet Take 2 tablets (650 mg total) by mouth every 6 (six) hours as needed for mild pain (or Fever >/= 101). (Patient taking differently: Take 1,300 mg by mouth every 6 (six) hours as needed for mild pain or headache (or Fever >/= 101). Arthritis strength) 30 tablet 0  . albuterol (VENTOLIN HFA) 108 (90 Base) MCG/ACT inhaler Inhale 2 puffs into the lungs every 6 (six) hours as needed for wheezing or shortness of breath.     . allopurinol (ZYLOPRIM) 100 MG tablet Take 1 tablet (100 mg total) by mouth daily. (Patient taking differently: Take 100 mg by mouth in the morning.) 30 tablet 1  . amiodarone (PACERONE) 200 MG tablet Take 1 tablet (200 mg total) by mouth daily. 90 tablet 3  . atorvastatin (LIPITOR) 40 MG tablet TAKE 1 TABLET BY MOUTH  DAILY 60 tablet 5  . B Complex-C-Zn-Folic Acid (DIALYVITE 732-KGUR 15) 0.8 MG TABS Take 1 tablet by mouth daily.    . clopidogrel (PLAVIX) 75 MG tablet TAKE 1 TABLET BY MOUTH  DAILY WITH BREAKFAST 60 tablet 5  . Darbepoetin Alfa (ARANESP) 100 MCG/0.5ML SOSY injection Inject 0.5 mLs (100 mcg total) into the vein every Thursday with hemodialysis. 4.2 mL   . ELIQUIS 2.5 MG TABS tablet TAKE 1 TABLET BY MOUTH  TWICE DAILY  180 tablet 3  . iron sucrose in sodium chloride 0.9 % 100 mL 50 mg.    . levocetirizine (XYZAL) 5 MG tablet Take 5 mg by mouth daily.    [name] levothyroxine (SYNTHROID, LEVOTHROID) 175 MCG tablet Take 175 mcg by mouth daily before breakfast.     . lidocaine-prilocaine (EMLA) cream SMARTSIG:1 Topical Every Night    . Methoxy PEG-Epoetin Beta (MIRCERA IJ) Inject 30 mg into the vein every 28 (twenty-eight) days.     . metoprolol succinate (TOPROL-XL) 25 MG 24 hr tablet TAKE 1 TABLET BY MOUTH  DAILY 90 tablet 3  . omeprazole (PRILOSEC) 40 MG capsule Take 1 capsule (40 mg total) by mouth in the morning  and at bedtime. (Patient taking differently: Take 40 mg by mouth daily.) 60 capsule 5   No current facility-administered medications for this visit.    SURGICAL HISTORY:  Past Surgical History:  Procedure Laterality Date  . ABDOMINAL AORTIC ANEURYSM REPAIR  2006  . AV FISTULA PLACEMENT Left 04/25/2019   Procedure: ARTERIOVENOUS (AV) FISTULA CREATION LEFT ARM;  Surgeon: Angelia Mould, MD;  Location: [facility];  Service: Vascular;  Laterality: Left;  . AV FISTULA PLACEMENT Left 05/19/2019   Procedure: CONVERSION OF LEFT ARM ARTERIOVENOUS FISTULA TO GRAFT;  Surgeon: Angelia Mould, MD;  Location: [facility];  Service: Vascular;  Laterality: Left;  . BIOPSY  09/30/2018   Procedure: BIOPSY;  Surgeon: Danie Binder, MD;  [facility];  Service: Endoscopy;;  ascending colon  . BIOPSY  07/27/2020   Procedure: BIOPSY;  Surgeon: Eloise Harman, DO;  [facility];  Service: Endoscopy;;  gastric  . BUBBLE STUDY  09/23/2020   Procedure: BUBBLE STUDY;  Surgeon: Skeet Latch, MD;  Location: [facility];  Service: Cardiovascular;;  . CARDIOVERSION N/A 09/23/2020   Procedure: CARDIOVERSION;  Surgeon: Skeet Latch, MD;  Location: [facility];  Service: Cardiovascular;  Laterality: N/A;  . COLONOSCOPY  2008  . COLONOSCOPY N/A 09/30/2018   External and internal hemorrhoids, six polyps removed, one ascending colon polypoid lesion biopsied. Six simple adenomas and one benign polypoid lesion. Colonoscopy Nov 2022.   [name] COLONOSCOPY WITH PROPOFOL N/A 07/27/2020   non-bleeding internal hemorrhoids, sigmoid and descending colon diverticulosis, four 1-2 mm polyps in ascending colon, one 5 mm polyp in transverse colon. 3 year surveillance. Tubular adenomas.   . CORONARY ANGIOPLASTY WITH STENT PLACEMENT  1997   MID CIRCUMFLEX  . CORONARY STENT INTERVENTION N/A 09/20/2020   Procedure: CORONARY STENT INTERVENTION;  Surgeon: Martinique, Peter M, MD;  Location: [facility];   Service: Cardiovascular;  Laterality: N/A;  . ESOPHAGOGASTRODUODENOSCOPY (EGD) WITH PROPOFOL N/A 07/27/2020   Food in middle third of esophagus, gastritis s/p biopsy, nodular mucosa in lesser curvature of stomach s/p biopsy. Negative H.pylori.   [name] HIP ARTHROPLASTY Right 04/30/2020   Procedure: ARTHROPLASTY  HIP (HEMIARTHROPLASTY);  Surgeon: Altamese Allendale, MD;  Location: [facility];  Service: Orthopedics;  Laterality: Right;  . INTRAVASCULAR ULTRASOUND/IVUS N/A 09/20/2020   Procedure: Intravascular Ultrasound/IVUS;  Surgeon: Martinique, Peter M, MD;  Location: [facility];  Service: Cardiovascular;  Laterality: N/A;  . IR FLUORO GUIDE CV LINE RIGHT  04/22/2019  . IR FLUORO GUIDE CV LINE RIGHT  05/01/2020  . IR THORACENTESIS ASP PLEURAL SPACE W/IMG GUIDE  09/22/2020  . IR THROMBECTOMY AV FISTULA W/THROMBOLYSIS/PTA INC/SHUNT/IMG LEFT Left 05/03/2020  . IR US GUIDE VASC ACCESS LEFT  05/03/2020  . IR US GUIDE VASC ACCESS RIGHT  04/22/2019  .  IR US GUIDE VASC ACCESS RIGHT  05/01/2020  . POLYPECTOMY  09/30/2018   Procedure: POLYPECTOMY;  Surgeon: Danie Binder, MD;  [facility];  Service: Endoscopy;;  colon  . POLYPECTOMY  07/27/2020   Procedure: POLYPECTOMY;  Surgeon: Eloise Harman, DO;  [facility];  Service: Endoscopy;;  . RIGHT/LEFT HEART CATH AND CORONARY ANGIOGRAPHY N/A 09/20/2020   Procedure: RIGHT/LEFT HEART CATH AND CORONARY ANGIOGRAPHY;  Surgeon: Martinique, Peter M, MD;  Location: [facility];  Service: Cardiovascular;  Laterality: N/A;  . TEE WITHOUT CARDIOVERSION N/A 09/23/2020   Procedure: TRANSESOPHAGEAL ECHOCARDIOGRAM (TEE);  Surgeon: Skeet Latch, MD;  Location: [facility];  Service: Cardiovascular;  Laterality: N/A;  . VIDEO BRONCHOSCOPY WITH ENDOBRONCHIAL NAVIGATION N/A 01/27/2019   Procedure: VIDEO BRONCHOSCOPY WITH ENDOBRONCHIAL NAVIGATION;  Surgeon: Grace Isaac, MD;  Location: [facility];  Service: Thoracic;  Laterality: N/A;  . VIDEO BRONCHOSCOPY  WITH ENDOBRONCHIAL ULTRASOUND N/A 01/27/2019   Procedure: VIDEO BRONCHOSCOPY WITH ENDOBRONCHIAL ULTRASOUND;  Surgeon: Grace Isaac, MD;  Location: [facility];  Service: Thoracic;  Laterality: N/A;    REVIEW OF SYSTEMS:  A comprehensive review of systems was negative.   PHYSICAL EXAMINATION: General appearance: alert, cooperative and no distress Head: Normocephalic, without obvious abnormality, atraumatic Neck: no adenopathy, no JVD, supple, symmetrical, trachea midline and thyroid not enlarged, symmetric, no tenderness/mass/nodules Lymph nodes: Cervical, supraclavicular, and axillary nodes normal. Resp: clear to auscultation bilaterally Back: symmetric, no curvature. ROM normal. No CVA tenderness. Cardio: regular rate and rhythm, S1, S2 normal, no murmur, click, rub or gallop GI: soft, non-tender; bowel sounds normal; no masses,  no organomegaly Extremities: extremities normal, atraumatic, no cyanosis or edema  ECOG PERFORMANCE STATUS: 0 - Asymptomatic  Blood pressure 109/62, pulse 78, temperature 97.6 F (36.4 C), temperature source Tympanic, resp. rate 15, height 5\' 9"  (1.753 m), weight 156 lb (70.8 kg), SpO2 94 %.  LABORATORY DATA: Lab Results  Component Value Date   WBC 7.7 01/17/2021   HGB 13.9 01/17/2021   HCT 42.5 01/17/2021   MCV 101.9 (H) 01/17/2021   PLT 164 01/17/2021      Chemistry      Component Value Date/Time   NA 136 01/17/2021 0930   K 4.8 01/17/2021 0930   CL 96 (L) 01/17/2021 0930   CO2 28 01/17/2021 0930   BUN 34 (H) 01/17/2021 0930   CREATININE 9.27 (HH) 01/17/2021 0930      Component Value Date/Time   CALCIUM 8.8 (L) 01/17/2021 0930   ALKPHOS 85 01/17/2021 0930   AST 18 01/17/2021 0930   ALT 12 01/17/2021 0930   BILITOT 0.6 01/17/2021 0930       RADIOGRAPHIC STUDIES: CT Chest Wo Contrast  Result Date: 01/17/2021 CLINICAL DATA:  Restaging small cell lung cancer. EXAM: CT CHEST WITHOUT CONTRAST TECHNIQUE: Multidetector CT imaging of the chest  was performed following the standard protocol without IV contrast. COMPARISON:  Chest CTs 09/16/2020 and 09/25/2019 FINDINGS: Cardiovascular: The heart is within normal limits in size for age and stable. Stable marked tortuosity the and calcification of the thoracic aorta and stable 4.5 cm saccular type aneurysm at the aortic arch. Stable extensive three-vessel coronary artery calcifications. Mediastinum/Nodes: Small scattered mediastinal and hilar lymph nodes are stable. No mass or overt adenopathy. The esophagus is grossly. Lungs/Pleura: Stable radiation changes involving the left upper lobe and left paramediastinal lung and left hilum. No findings suspicious for recurrent tumor. Underlying emphysematous changes are again noted. Few small scattered pulmonary nodules  are stable. No findings suspicious for pulmonary metastatic disease. No new pulmonary lesions. No acute overlying pulmonary process. No pleural effusions or pleural lesions. Upper Abdomen: No findings suspicious for upper abdominal metastatic disease. No obvious hepatic or adrenal gland lesions without contrast. Extensive vascular calcifications. Musculoskeletal: No chest wall mass, supraclavicular or axillary adenopathy. No significant bony findings. IMPRESSION: 1. Stable radiation changes involving the left upper lobe and left paramediastinal lung and left hilum. No findings suspicious for recurrent tumor. 2. Stable small scattered pulmonary nodules. No findings suspicious for pulmonary metastatic disease. 3. Stable emphysematous changes. 4. Stable marked tortuosity and calcification of the thoracic aorta with stable 4.5 cm saccular type aneurysm at the aortic arch. 5. Stable extensive three-vessel coronary artery calcifications. 6. Emphysema and aortic atherosclerosis. Aortic Atherosclerosis (ICD10-I70.0) and Emphysema (ICD10-J43.9). Electronically Signed   By: Marijo Sanes M.D.   On: 01/17/2021 11:20    ASSESSMENT AND PLAN: This is a very  pleasant 75 years old white male with limited stage small cell lung cancer and currently undergoing systemic chemotherapy with carboplatin and etoposide status post 4 cycles. The patient tolerated his treatment well except for the pancytopenia that was complicated by his end-stage renal disease. The patient is currently on observation and he is feeling fine today with no concerning complaints.  He had repeat CT scan of the chest performed recently.  I personally and independently reviewed the scans and discussed the results with the patient today. His scan showed no concerning findings for disease recurrence or metastasis. I recommended for him to continue on observation with repeat CT scan of the chest without contrast in 6 months. For the renal insufficiency he is followed by nephrology and currently on hemodialysis. He was advised to call immediately if he has any other concerning symptoms in the interval. The patient voices understanding of current disease status and treatment options and is in agreement with the current care plan.  All questions were answered. The patient knows to call the clinic with any problems, questions or concerns. We can certainly see the patient much sooner if necessary.  Disclaimer: This note was dictated with voice recognition software. Similar sounding words can inadvertently be transcribed and may not be corrected upon review.

## 2021-01-19 ENCOUNTER — Telehealth: Payer: Self-pay | Admitting: Internal Medicine

## 2021-01-19 NOTE — Telephone Encounter (Signed)
Scheduled per los. Called, not able to leave msg. Mailed printout  

## 2021-01-20 DIAGNOSIS — N186 End stage renal disease: Secondary | ICD-10-CM | POA: Diagnosis not present

## 2021-01-20 DIAGNOSIS — Z992 Dependence on renal dialysis: Secondary | ICD-10-CM | POA: Diagnosis not present

## 2021-01-20 DIAGNOSIS — D689 Coagulation defect, unspecified: Secondary | ICD-10-CM | POA: Diagnosis not present

## 2021-01-20 DIAGNOSIS — N2581 Secondary hyperparathyroidism of renal origin: Secondary | ICD-10-CM | POA: Diagnosis not present

## 2021-01-22 DIAGNOSIS — N2581 Secondary hyperparathyroidism of renal origin: Secondary | ICD-10-CM | POA: Diagnosis not present

## 2021-01-22 DIAGNOSIS — N186 End stage renal disease: Secondary | ICD-10-CM | POA: Diagnosis not present

## 2021-01-22 DIAGNOSIS — D689 Coagulation defect, unspecified: Secondary | ICD-10-CM | POA: Diagnosis not present

## 2021-01-22 DIAGNOSIS — Z992 Dependence on renal dialysis: Secondary | ICD-10-CM | POA: Diagnosis not present

## 2021-01-25 DIAGNOSIS — Z23 Encounter for immunization: Secondary | ICD-10-CM | POA: Diagnosis not present

## 2021-01-25 DIAGNOSIS — N186 End stage renal disease: Secondary | ICD-10-CM | POA: Diagnosis not present

## 2021-01-25 DIAGNOSIS — D689 Coagulation defect, unspecified: Secondary | ICD-10-CM | POA: Diagnosis not present

## 2021-01-25 DIAGNOSIS — Z992 Dependence on renal dialysis: Secondary | ICD-10-CM | POA: Diagnosis not present

## 2021-01-25 DIAGNOSIS — N2581 Secondary hyperparathyroidism of renal origin: Secondary | ICD-10-CM | POA: Diagnosis not present

## 2021-01-27 DIAGNOSIS — D689 Coagulation defect, unspecified: Secondary | ICD-10-CM | POA: Diagnosis not present

## 2021-01-27 DIAGNOSIS — N2581 Secondary hyperparathyroidism of renal origin: Secondary | ICD-10-CM | POA: Diagnosis not present

## 2021-01-27 DIAGNOSIS — Z992 Dependence on renal dialysis: Secondary | ICD-10-CM | POA: Diagnosis not present

## 2021-01-27 DIAGNOSIS — Z23 Encounter for immunization: Secondary | ICD-10-CM | POA: Diagnosis not present

## 2021-01-27 DIAGNOSIS — N186 End stage renal disease: Secondary | ICD-10-CM | POA: Diagnosis not present

## 2021-01-28 ENCOUNTER — Other Ambulatory Visit (HOSPITAL_COMMUNITY): Payer: Medicare Other

## 2021-01-29 DIAGNOSIS — Z992 Dependence on renal dialysis: Secondary | ICD-10-CM | POA: Diagnosis not present

## 2021-01-29 DIAGNOSIS — N186 End stage renal disease: Secondary | ICD-10-CM | POA: Diagnosis not present

## 2021-01-29 DIAGNOSIS — D689 Coagulation defect, unspecified: Secondary | ICD-10-CM | POA: Diagnosis not present

## 2021-01-29 DIAGNOSIS — N2581 Secondary hyperparathyroidism of renal origin: Secondary | ICD-10-CM | POA: Diagnosis not present

## 2021-01-29 DIAGNOSIS — Z23 Encounter for immunization: Secondary | ICD-10-CM | POA: Diagnosis not present

## 2021-01-30 DIAGNOSIS — J449 Chronic obstructive pulmonary disease, unspecified: Secondary | ICD-10-CM | POA: Diagnosis not present

## 2021-01-31 ENCOUNTER — Other Ambulatory Visit (HOSPITAL_COMMUNITY): Payer: Medicare Other

## 2021-02-01 DIAGNOSIS — Z992 Dependence on renal dialysis: Secondary | ICD-10-CM | POA: Diagnosis not present

## 2021-02-01 DIAGNOSIS — N2581 Secondary hyperparathyroidism of renal origin: Secondary | ICD-10-CM | POA: Diagnosis not present

## 2021-02-01 DIAGNOSIS — D689 Coagulation defect, unspecified: Secondary | ICD-10-CM | POA: Diagnosis not present

## 2021-02-01 DIAGNOSIS — N186 End stage renal disease: Secondary | ICD-10-CM | POA: Diagnosis not present

## 2021-02-03 DIAGNOSIS — Z992 Dependence on renal dialysis: Secondary | ICD-10-CM | POA: Diagnosis not present

## 2021-02-03 DIAGNOSIS — N186 End stage renal disease: Secondary | ICD-10-CM | POA: Diagnosis not present

## 2021-02-03 DIAGNOSIS — N2581 Secondary hyperparathyroidism of renal origin: Secondary | ICD-10-CM | POA: Diagnosis not present

## 2021-02-03 DIAGNOSIS — D689 Coagulation defect, unspecified: Secondary | ICD-10-CM | POA: Diagnosis not present

## 2021-02-05 DIAGNOSIS — Z992 Dependence on renal dialysis: Secondary | ICD-10-CM | POA: Diagnosis not present

## 2021-02-05 DIAGNOSIS — D689 Coagulation defect, unspecified: Secondary | ICD-10-CM | POA: Diagnosis not present

## 2021-02-05 DIAGNOSIS — N2581 Secondary hyperparathyroidism of renal origin: Secondary | ICD-10-CM | POA: Diagnosis not present

## 2021-02-05 DIAGNOSIS — N186 End stage renal disease: Secondary | ICD-10-CM | POA: Diagnosis not present

## 2021-02-07 ENCOUNTER — Ambulatory Visit: Payer: Medicare Other | Admitting: Urology

## 2021-02-08 DIAGNOSIS — N186 End stage renal disease: Secondary | ICD-10-CM | POA: Diagnosis not present

## 2021-02-08 DIAGNOSIS — N2581 Secondary hyperparathyroidism of renal origin: Secondary | ICD-10-CM | POA: Diagnosis not present

## 2021-02-08 DIAGNOSIS — D689 Coagulation defect, unspecified: Secondary | ICD-10-CM | POA: Diagnosis not present

## 2021-02-08 DIAGNOSIS — Z992 Dependence on renal dialysis: Secondary | ICD-10-CM | POA: Diagnosis not present

## 2021-02-10 DIAGNOSIS — D689 Coagulation defect, unspecified: Secondary | ICD-10-CM | POA: Diagnosis not present

## 2021-02-10 DIAGNOSIS — I129 Hypertensive chronic kidney disease with stage 1 through stage 4 chronic kidney disease, or unspecified chronic kidney disease: Secondary | ICD-10-CM | POA: Diagnosis not present

## 2021-02-10 DIAGNOSIS — N186 End stage renal disease: Secondary | ICD-10-CM | POA: Diagnosis not present

## 2021-02-10 DIAGNOSIS — Z992 Dependence on renal dialysis: Secondary | ICD-10-CM | POA: Diagnosis not present

## 2021-02-10 DIAGNOSIS — N2581 Secondary hyperparathyroidism of renal origin: Secondary | ICD-10-CM | POA: Diagnosis not present

## 2021-02-12 DIAGNOSIS — Z992 Dependence on renal dialysis: Secondary | ICD-10-CM | POA: Diagnosis not present

## 2021-02-12 DIAGNOSIS — N2581 Secondary hyperparathyroidism of renal origin: Secondary | ICD-10-CM | POA: Diagnosis not present

## 2021-02-12 DIAGNOSIS — D689 Coagulation defect, unspecified: Secondary | ICD-10-CM | POA: Diagnosis not present

## 2021-02-12 DIAGNOSIS — N186 End stage renal disease: Secondary | ICD-10-CM | POA: Diagnosis not present

## 2021-02-14 ENCOUNTER — Ambulatory Visit: Payer: Medicare Other | Admitting: Cardiology

## 2021-02-15 DIAGNOSIS — N186 End stage renal disease: Secondary | ICD-10-CM | POA: Diagnosis not present

## 2021-02-15 DIAGNOSIS — N2581 Secondary hyperparathyroidism of renal origin: Secondary | ICD-10-CM | POA: Diagnosis not present

## 2021-02-15 DIAGNOSIS — Z992 Dependence on renal dialysis: Secondary | ICD-10-CM | POA: Diagnosis not present

## 2021-02-15 DIAGNOSIS — D689 Coagulation defect, unspecified: Secondary | ICD-10-CM | POA: Diagnosis not present

## 2021-02-16 ENCOUNTER — Encounter: Payer: Self-pay | Admitting: Cardiology

## 2021-02-16 ENCOUNTER — Ambulatory Visit (INDEPENDENT_AMBULATORY_CARE_PROVIDER_SITE_OTHER): Payer: Medicare Other | Admitting: Cardiology

## 2021-02-16 ENCOUNTER — Other Ambulatory Visit: Payer: Self-pay

## 2021-02-16 VITALS — BP 100/54 | HR 74 | Ht 69.0 in | Wt 164.0 lb

## 2021-02-16 DIAGNOSIS — I2510 Atherosclerotic heart disease of native coronary artery without angina pectoris: Secondary | ICD-10-CM

## 2021-02-16 DIAGNOSIS — Z01810 Encounter for preprocedural cardiovascular examination: Secondary | ICD-10-CM | POA: Diagnosis not present

## 2021-02-16 DIAGNOSIS — I4892 Unspecified atrial flutter: Secondary | ICD-10-CM

## 2021-02-16 DIAGNOSIS — I5022 Chronic systolic (congestive) heart failure: Secondary | ICD-10-CM | POA: Diagnosis not present

## 2021-02-16 MED ORDER — METOPROLOL SUCCINATE ER 25 MG PO TB24: 12.5000 mg | ORAL_TABLET | Freq: Every day | ORAL | 3 refills | Status: DC

## 2021-02-16 NOTE — Progress Notes (Signed)
Clinical Summary Mr. Prosser is a 75 y.o.male seen today for follow up of the following medical problems.   1. CAD - prior stent to LCX in 1997 - 09/2020 DES x2 to LCX. Had some hemorroidal bleeing on triple therapy, plan for eliquis and plavix for one year  - denies chest, no SOB/DOE - compliant with meds   2. Chronic systolic HF - new diagnosis during 09/2020 admission - 09/2020 echo LVEF 15-20%, grade III dd, mild RV dysfunction -12/2020 echo LVEF 25-30%, normal RV function  - low bp's on HD had limited medical therapy. Nephrology increasing dry weight but ongoing issue  - he is hesitant for ICD consideration, favors ongoing medical therapy.   - no recent edema, fluid managed by HD.   3. ESRD - low bp's on HD - nephrologist working on increasing dry weight  4. Aflutter - s/p TEE/DCCV during 09/2020 admission - started on amio  - denies any palpitations  3. Thoracic aneurysm 4.2cm thoracic aortic aneurysm at the arch, 3.9cm at the diaphragm and 3cm at the infrarenal aorta - Penetrating atherosclerotic ulcer noted along the undersurface of the aortic arch adjacent to the area of maximal dilation. This demonstrates interval decrease in size with increasing mural thrombus when compared to prior examination. - followed by CT surgery  4. Lung cancer  5. Bladder tumor - considering cystocscopy with tumor removal, urology Dr Alyson Ingles - chronic mild hematuria.  - upcoming urology procedure   Past Medical History:  Diagnosis Date  . Anemia   . Blood transfusion without reported diagnosis   . CAD (coronary artery disease)    STENT... MID CIRCUMFLEX...1997  . Chronic kidney disease    STAGE 3  . COPD (chronic obstructive pulmonary disease) ([place])   . Degenerative joint disease (DJD) of lumbar spine   . GERD (gastroesophageal reflux disease)   . Gout   . Hyperlipidemia   . Hypertension   . Hypothyroidism   . Incisional hernia    abdomen  .  Leukocytosis    CHRONIC MILD  . Myocardial infarction ([place])    1997  . SCL CA dx'd 01/2019   Lung cancer     Allergies  Allergen Reactions  . Advair Hfa [Fluticasone-Salmeterol] Other (See Comments)    Developed thrush, although the mouth WAS being rinsed as directed  . Penicillins Rash    Has patient had a PCN reaction causing immediate rash, facial/tongue/throat swelling, SOB or lightheadedness with hypotension: No Has patient had a PCN reaction causing severe rash involving mucus membranes or skin necrosis: No Has patient had a PCN reaction that required hospitalization: No Has patient had a PCN reaction occurring within the last 10 years: No If all of the above answers are "NO", then may proceed with Cephalosporin use.      Current Outpatient Medications  Medication Sig Dispense Refill  . acetaminophen (TYLENOL) 325 MG tablet Take 2 tablets (650 mg total) by mouth every 6 (six) hours as needed for mild pain (or Fever >/= 101). (Patient taking differently: Take 1,300 mg by mouth every 6 (six) hours as needed for mild pain or headache (or Fever >/= 101). Arthritis strength) 30 tablet 0  . albuterol (VENTOLIN HFA) 108 (90 Base) MCG/ACT inhaler Inhale 2 puffs into the lungs every 6 (six) hours as needed for wheezing or shortness of breath.     . allopurinol (ZYLOPRIM) 100 MG tablet Take 1 tablet (100 mg total) by mouth daily. (Patient taking differently: Take 100 mg  by mouth in the morning.) 30 tablet 1  . amiodarone (PACERONE) 200 MG tablet Take 1 tablet (200 mg total) by mouth daily. 90 tablet 3  . atorvastatin (LIPITOR) 40 MG tablet TAKE 1 TABLET BY MOUTH  DAILY 60 tablet 5  . B Complex-C-Zn-Folic Acid (DIALYVITE 053-ZJQB 15) 0.8 MG TABS Take 1 tablet by mouth daily.    . clopidogrel (PLAVIX) 75 MG tablet TAKE 1 TABLET BY MOUTH  DAILY WITH BREAKFAST 60 tablet 5  . Darbepoetin Alfa (ARANESP) 100 MCG/0.5ML SOSY injection Inject 0.5 mLs (100 mcg total) into the vein every Thursday with  hemodialysis. 4.2 mL   . ELIQUIS 2.5 MG TABS tablet TAKE 1 TABLET BY MOUTH  TWICE DAILY 180 tablet 3  . iron sucrose in sodium chloride 0.9 % 100 mL 50 mg.    . levocetirizine (XYZAL) 5 MG tablet Take 5 mg by mouth daily.    [name] levothyroxine (SYNTHROID, LEVOTHROID) 175 MCG tablet Take 175 mcg by mouth daily before breakfast.     . lidocaine-prilocaine (EMLA) cream SMARTSIG:1 Topical Every Night    . Methoxy PEG-Epoetin Beta (MIRCERA IJ) Inject 30 mg into the vein every 28 (twenty-eight) days.     . metoprolol succinate (TOPROL-XL) 25 MG 24 hr tablet TAKE 1 TABLET BY MOUTH  DAILY 90 tablet 3  . omeprazole (PRILOSEC) 40 MG capsule Take 1 capsule (40 mg total) by mouth in the morning and at bedtime. (Patient taking differently: Take 40 mg by mouth daily.) 60 capsule 5   No current facility-administered medications for this visit.     Past Surgical History:  Procedure Laterality Date  . ABDOMINAL AORTIC ANEURYSM REPAIR  2006  . AV FISTULA PLACEMENT Left 04/25/2019   Procedure: ARTERIOVENOUS (AV) FISTULA CREATION LEFT ARM;  Surgeon: Angelia Mould, MD;  Location: [facility];  Service: Vascular;  Laterality: Left;  . AV FISTULA PLACEMENT Left 05/19/2019   Procedure: CONVERSION OF LEFT ARM ARTERIOVENOUS FISTULA TO GRAFT;  Surgeon: Angelia Mould, MD;  Location: [place];  Service: Vascular;  Laterality: Left;  . BIOPSY  09/30/2018   Procedure: BIOPSY;  Surgeon: Danie Binder, MD;  [facility];  Service: Endoscopy;;  ascending colon  . BIOPSY  07/27/2020   Procedure: BIOPSY;  Surgeon: Eloise Harman, DO;  [facility];  Service: Endoscopy;;  gastric  . BUBBLE STUDY  09/23/2020   Procedure: BUBBLE STUDY;  Surgeon: Skeet Latch, MD;  Location: [facility] [place];  Service: Cardiovascular;;  . CARDIOVERSION N/A 09/23/2020   Procedure: CARDIOVERSION;  Surgeon: Skeet Latch, MD;  Location: [place];  Service: Cardiovascular;  Laterality: N/A;  . COLONOSCOPY   2008  . COLONOSCOPY N/A 09/30/2018   External and internal hemorrhoids, six polyps removed, one ascending colon polypoid lesion biopsied. Six simple adenomas and one benign polypoid lesion. Colonoscopy Nov 2022.   [name] COLONOSCOPY WITH PROPOFOL N/A 07/27/2020   non-bleeding internal hemorrhoids, sigmoid and descending colon diverticulosis, four 1-2 mm polyps in ascending colon, one 5 mm polyp in transverse colon. 3 year surveillance. Tubular adenomas.   . CORONARY ANGIOPLASTY WITH STENT PLACEMENT  1997   MID CIRCUMFLEX  . CORONARY STENT INTERVENTION N/A 09/20/2020   Procedure: CORONARY STENT INTERVENTION;  Surgeon: Martinique, Peter M, MD;  Location: [facility];  Service: Cardiovascular;  Laterality: N/A;  . ESOPHAGOGASTRODUODENOSCOPY (EGD) WITH PROPOFOL N/A 07/27/2020   Food in middle third of esophagus, gastritis s/p biopsy, nodular mucosa in lesser curvature of stomach s/p biopsy. Negative H.pylori.   [name]  HIP ARTHROPLASTY Right 04/30/2020   Procedure: ARTHROPLASTY  HIP (HEMIARTHROPLASTY);  Surgeon: Altamese Herrick, MD;  Location: [facility];  Service: Orthopedics;  Laterality: Right;  . INTRAVASCULAR ULTRASOUND/IVUS N/A 09/20/2020   Procedure: Intravascular Ultrasound/IVUS;  Surgeon: Martinique, Peter M, MD;  Location: [facility];  Service: Cardiovascular;  Laterality: N/A;  . IR FLUORO GUIDE CV LINE RIGHT  04/22/2019  . IR FLUORO GUIDE CV LINE RIGHT  05/01/2020  . IR THORACENTESIS ASP PLEURAL SPACE W/IMG GUIDE  09/22/2020  . IR THROMBECTOMY AV FISTULA W/THROMBOLYSIS/PTA INC/SHUNT/IMG LEFT Left 05/03/2020  . IR US GUIDE VASC ACCESS LEFT  05/03/2020  . IR US GUIDE VASC ACCESS RIGHT  04/22/2019  . IR US GUIDE VASC ACCESS RIGHT  05/01/2020  . POLYPECTOMY  09/30/2018   Procedure: POLYPECTOMY;  Surgeon: Danie Binder, MD;  [facility];  Service: Endoscopy;;  colon  . POLYPECTOMY  07/27/2020   Procedure: POLYPECTOMY;  Surgeon: Eloise Harman, DO;  [facility];  Service: Endoscopy;;   . RIGHT/LEFT HEART CATH AND CORONARY ANGIOGRAPHY N/A 09/20/2020   Procedure: RIGHT/LEFT HEART CATH AND CORONARY ANGIOGRAPHY;  Surgeon: Martinique, Peter M, MD;  Location: [place];  Service: Cardiovascular;  Laterality: N/A;  . TEE WITHOUT CARDIOVERSION N/A 09/23/2020   Procedure: TRANSESOPHAGEAL ECHOCARDIOGRAM (TEE);  Surgeon: Skeet Latch, MD;  Location: [place];  Service: Cardiovascular;  Laterality: N/A;  . VIDEO BRONCHOSCOPY WITH ENDOBRONCHIAL NAVIGATION N/A 01/27/2019   Procedure: VIDEO BRONCHOSCOPY WITH ENDOBRONCHIAL NAVIGATION;  Surgeon: Grace Isaac, MD;  Location: [facility];  Service: Thoracic;  Laterality: N/A;  . VIDEO BRONCHOSCOPY WITH ENDOBRONCHIAL ULTRASOUND N/A 01/27/2019   Procedure: VIDEO BRONCHOSCOPY WITH ENDOBRONCHIAL ULTRASOUND;  Surgeon: Grace Isaac, MD;  Location: [facility];  Service: Thoracic;  Laterality: N/A;     Allergies  Allergen Reactions  . Advair Hfa [Fluticasone-Salmeterol] Other (See Comments)    Developed thrush, although the mouth WAS being rinsed as directed  . Penicillins Rash    Has patient had a PCN reaction causing immediate rash, facial/tongue/throat swelling, SOB or lightheadedness with hypotension: No Has patient had a PCN reaction causing severe rash involving mucus membranes or skin necrosis: No Has patient had a PCN reaction that required hospitalization: No Has patient had a PCN reaction occurring within the last 10 years: No If all of the above answers are "NO", then may proceed with Cephalosporin use.       Family History  Problem Relation Age of Onset  . Stroke Brother   . Lung cancer Sister 45       lung cancer/former  . Colon cancer Neg Hx   . Colon polyps Neg Hx      Social History Mr. Goin reports that he quit smoking about 3 years ago. He has a 27.00 pack-year smoking history. He has never used smokeless tobacco. Mr. Coburn reports previous alcohol use.   Review of Systems CONSTITUTIONAL: No weight  loss, fever, chills, weakness or fatigue.  HEENT: Eyes: No visual loss, blurred vision, double vision or yellow sclerae.No hearing loss, sneezing, congestion, runny nose or sore throat.  SKIN: No rash or itching.  CARDIOVASCULAR: per hpi RESPIRATORY: No shortness of breath, cough or sputum.  GASTROINTESTINAL: No anorexia, nausea, vomiting or diarrhea. No abdominal pain or blood.  GENITOURINARY: No burning on urination, no polyuria NEUROLOGICAL: No headache, dizziness, syncope, paralysis, ataxia, numbness or tingling in the extremities. No change in bowel or bladder control.  MUSCULOSKELETAL: No muscle, back pain, joint pain  or stiffness.  LYMPHATICS: No enlarged nodes. No history of splenectomy.  PSYCHIATRIC: No history of depression or anxiety.  ENDOCRINOLOGIC: No reports of sweating, cold or heat intolerance. No polyuria or polydipsia.  [name]   Physical Examination Today's Vitals   02/16/21 1408  BP: (!) 100/54  Pulse: 74  SpO2: 97%  Weight: 164 lb (74.4 kg)  Height: 5\' 9"  (1.753 m)   Body mass index is 24.22 kg/m.  Gen: resting comfortably, no acute distress HEENT: no scleral icterus, pupils equal round and reactive, no palptable cervical adenopathy,  CV: RRR, no m/r/g, no jvd Resp: Clear to auscultation bilaterally GI: abdomen is soft, non-tender, non-distended, normal bowel sounds, no hepatosplenomegaly MSK: extremities are warm, no edema.  Skin: warm, no rash Neuro:  no focal deficits Psych: appropriate affect   Diagnostic Studies  LHC 09/20/2020  Prox LAD to Mid LAD lesion is 40% stenosed.  Mid Cx lesion is 50% stenosed.  Mid Cx to Dist Cx lesion is 90% stenosed.  Prox Cx lesion is 50% stenosed.  RV Johnesha Acheampong lesion is 50% stenosed.  A drug-eluting stent was successfully placed using a STENT RESOLUTE ONYX 3.0X34.  Post intervention, there is a 0% residual stenosis.  Post intervention, there is a 0% residual stenosis.  Post intervention, there is a 0% residual  stenosis.  A drug-eluting stent was successfully placed using a STENT RESOLUTE ONYX 4.0X18.  LV end diastolic pressure is normal.  Hemodynamic findings consistent with mild pulmonary hypertension.  1. Left dominant circulation 2. Severe single vessel obstructive CAD involving the proximal to mid LCx 3. Normal LV filling pressures 4. Mild pulmonary HTN 5. Normal cardiac output 6. Successful PCI of the proximal to mid LCx with DES x 2 overlapping. Lesion modified with cutting balloon angioplasty and Shockwave therapy.   Plan: DAPT for at least one year. Optimize medical therapy for CHF.   TTE 09/17/2020   1. Left ventricular ejection fraction, by estimation, is 15-20%. The left  ventricle has severely decreased function. The left ventricle demonstrates  global hypokinesis. The left ventricular internal cavity size was mildly  dilated. Left ventricular  diastolic parameters are consistent with Grade III diastolic dysfunction  (restrictive). Elevated left atrial pressure.  2. Right ventricular systolic function is mildly reduced. The right  ventricular size is normal.  3. Left atrial size was severely dilated.  4. Right atrial size was severely dilated.  5. Large pleural effusion in the left lateral region.  6. The mitral valve is normal in structure. Mild mitral valve  regurgitation. No evidence of mitral stenosis.  7. The aortic valve is tricuspid. Aortic valve regurgitation is not  visualized. No aortic stenosis is present.  8. The inferior vena cava is dilated in size with >50% respiratory  variability, suggesting right atrial pressure of 8 mmHg.    Assessment and Plan  1. CAD - denies any symptoms - continue current meds, continue plavix at least until 09/2021. Ok to hold for urology procedure next month  2. Chronic systolic HF - euvolemic, volume status controlled with HD - on max tolerated medical therapy due to low bp's with HD - we will continue current  meds  3. Aflutter - no recent symptoms - continue current meds  4. Preoperative evaluation - considering bladder surgery - ok to proceed, would need to hold plavix 5 days prior and eliquis 2 days prior, resume both day after.       Arnoldo Lenis, M.D.

## 2021-02-16 NOTE — Patient Instructions (Signed)
Medication Instructions:  Your physician recommends that you continue on your current medications as directed. Please refer to the Current Medication list given to you today.  *If you need a refill on your cardiac medications before your next appointment, please call your pharmacy*   Lab Work: None If you have labs (blood work) drawn today and your tests are completely normal, you will receive your results only by: [name] MyChart Message (if you have MyChart) OR . A paper copy in the mail If you have any lab test that is abnormal or we need to change your treatment, we will call you to review the results.   Testing/Procedures: None   Follow-Up: At [facility], you and your health needs are our priority.  As part of our continuing mission to provide you with exceptional heart care, we have created designated Provider Care Teams.  These Care Teams include your primary Cardiologist (physician) and Advanced Practice Providers (APPs -  Physician Assistants and Nurse Practitioners) who all work together to provide you with the care you need, when you need it.  We recommend signing up for the patient portal called "MyChart".  Sign up information is provided on this After Visit Summary.  MyChart is used to connect with patients for Virtual Visits (Telemedicine).  Patients are able to view lab/test results, encounter notes, upcoming appointments, etc.  Non-urgent messages can be sent to your provider as well.   To learn more about what you can do with MyChart, go to [email or web address].    Your next appointment:   4 month(s)  The format for your next appointment:   In Person  Provider:   Carlyle Dolly, MD   Other Instructions

## 2021-02-17 DIAGNOSIS — N186 End stage renal disease: Secondary | ICD-10-CM | POA: Diagnosis not present

## 2021-02-17 DIAGNOSIS — Z992 Dependence on renal dialysis: Secondary | ICD-10-CM | POA: Diagnosis not present

## 2021-02-17 DIAGNOSIS — N2581 Secondary hyperparathyroidism of renal origin: Secondary | ICD-10-CM | POA: Diagnosis not present

## 2021-02-17 DIAGNOSIS — D689 Coagulation defect, unspecified: Secondary | ICD-10-CM | POA: Diagnosis not present

## 2021-02-19 DIAGNOSIS — N2581 Secondary hyperparathyroidism of renal origin: Secondary | ICD-10-CM | POA: Diagnosis not present

## 2021-02-19 DIAGNOSIS — D689 Coagulation defect, unspecified: Secondary | ICD-10-CM | POA: Diagnosis not present

## 2021-02-19 DIAGNOSIS — Z992 Dependence on renal dialysis: Secondary | ICD-10-CM | POA: Diagnosis not present

## 2021-02-19 DIAGNOSIS — N186 End stage renal disease: Secondary | ICD-10-CM | POA: Diagnosis not present

## 2021-02-22 DIAGNOSIS — N186 End stage renal disease: Secondary | ICD-10-CM | POA: Diagnosis not present

## 2021-02-22 DIAGNOSIS — Z992 Dependence on renal dialysis: Secondary | ICD-10-CM | POA: Diagnosis not present

## 2021-02-22 DIAGNOSIS — D689 Coagulation defect, unspecified: Secondary | ICD-10-CM | POA: Diagnosis not present

## 2021-02-22 DIAGNOSIS — N2581 Secondary hyperparathyroidism of renal origin: Secondary | ICD-10-CM | POA: Diagnosis not present

## 2021-02-24 DIAGNOSIS — Z992 Dependence on renal dialysis: Secondary | ICD-10-CM | POA: Diagnosis not present

## 2021-02-24 DIAGNOSIS — N2581 Secondary hyperparathyroidism of renal origin: Secondary | ICD-10-CM | POA: Diagnosis not present

## 2021-02-24 DIAGNOSIS — D689 Coagulation defect, unspecified: Secondary | ICD-10-CM | POA: Diagnosis not present

## 2021-02-24 DIAGNOSIS — N186 End stage renal disease: Secondary | ICD-10-CM | POA: Diagnosis not present

## 2021-02-26 DIAGNOSIS — Z992 Dependence on renal dialysis: Secondary | ICD-10-CM | POA: Diagnosis not present

## 2021-02-26 DIAGNOSIS — N186 End stage renal disease: Secondary | ICD-10-CM | POA: Diagnosis not present

## 2021-02-26 DIAGNOSIS — D689 Coagulation defect, unspecified: Secondary | ICD-10-CM | POA: Diagnosis not present

## 2021-02-26 DIAGNOSIS — N2581 Secondary hyperparathyroidism of renal origin: Secondary | ICD-10-CM | POA: Diagnosis not present

## 2021-03-01 DIAGNOSIS — I711 Thoracic aortic aneurysm, ruptured: Secondary | ICD-10-CM | POA: Diagnosis not present

## 2021-03-01 DIAGNOSIS — N2581 Secondary hyperparathyroidism of renal origin: Secondary | ICD-10-CM | POA: Diagnosis not present

## 2021-03-01 DIAGNOSIS — E039 Hypothyroidism, unspecified: Secondary | ICD-10-CM | POA: Diagnosis not present

## 2021-03-01 DIAGNOSIS — D689 Coagulation defect, unspecified: Secondary | ICD-10-CM | POA: Diagnosis not present

## 2021-03-01 DIAGNOSIS — Z992 Dependence on renal dialysis: Secondary | ICD-10-CM | POA: Diagnosis not present

## 2021-03-01 DIAGNOSIS — I5022 Chronic systolic (congestive) heart failure: Secondary | ICD-10-CM | POA: Diagnosis not present

## 2021-03-01 DIAGNOSIS — N186 End stage renal disease: Secondary | ICD-10-CM | POA: Diagnosis not present

## 2021-03-01 DIAGNOSIS — C3491 Malignant neoplasm of unspecified part of right bronchus or lung: Secondary | ICD-10-CM | POA: Diagnosis not present

## 2021-03-02 DIAGNOSIS — J449 Chronic obstructive pulmonary disease, unspecified: Secondary | ICD-10-CM | POA: Diagnosis not present

## 2021-03-03 DIAGNOSIS — Z992 Dependence on renal dialysis: Secondary | ICD-10-CM | POA: Diagnosis not present

## 2021-03-03 DIAGNOSIS — E039 Hypothyroidism, unspecified: Secondary | ICD-10-CM | POA: Diagnosis not present

## 2021-03-03 DIAGNOSIS — N2581 Secondary hyperparathyroidism of renal origin: Secondary | ICD-10-CM | POA: Diagnosis not present

## 2021-03-03 DIAGNOSIS — D689 Coagulation defect, unspecified: Secondary | ICD-10-CM | POA: Diagnosis not present

## 2021-03-03 DIAGNOSIS — N186 End stage renal disease: Secondary | ICD-10-CM | POA: Diagnosis not present

## 2021-03-04 DIAGNOSIS — Z23 Encounter for immunization: Secondary | ICD-10-CM | POA: Diagnosis not present

## 2021-03-05 DIAGNOSIS — D689 Coagulation defect, unspecified: Secondary | ICD-10-CM | POA: Diagnosis not present

## 2021-03-05 DIAGNOSIS — N186 End stage renal disease: Secondary | ICD-10-CM | POA: Diagnosis not present

## 2021-03-05 DIAGNOSIS — N2581 Secondary hyperparathyroidism of renal origin: Secondary | ICD-10-CM | POA: Diagnosis not present

## 2021-03-05 DIAGNOSIS — Z992 Dependence on renal dialysis: Secondary | ICD-10-CM | POA: Diagnosis not present

## 2021-03-05 DIAGNOSIS — E039 Hypothyroidism, unspecified: Secondary | ICD-10-CM | POA: Diagnosis not present

## 2021-03-08 DIAGNOSIS — D689 Coagulation defect, unspecified: Secondary | ICD-10-CM | POA: Diagnosis not present

## 2021-03-08 DIAGNOSIS — N2581 Secondary hyperparathyroidism of renal origin: Secondary | ICD-10-CM | POA: Diagnosis not present

## 2021-03-08 DIAGNOSIS — Z992 Dependence on renal dialysis: Secondary | ICD-10-CM | POA: Diagnosis not present

## 2021-03-08 DIAGNOSIS — N186 End stage renal disease: Secondary | ICD-10-CM | POA: Diagnosis not present

## 2021-03-10 DIAGNOSIS — N2581 Secondary hyperparathyroidism of renal origin: Secondary | ICD-10-CM | POA: Diagnosis not present

## 2021-03-10 DIAGNOSIS — D689 Coagulation defect, unspecified: Secondary | ICD-10-CM | POA: Diagnosis not present

## 2021-03-10 DIAGNOSIS — N186 End stage renal disease: Secondary | ICD-10-CM | POA: Diagnosis not present

## 2021-03-10 DIAGNOSIS — Z992 Dependence on renal dialysis: Secondary | ICD-10-CM | POA: Diagnosis not present

## 2021-03-12 DIAGNOSIS — N186 End stage renal disease: Secondary | ICD-10-CM | POA: Diagnosis not present

## 2021-03-12 DIAGNOSIS — Z992 Dependence on renal dialysis: Secondary | ICD-10-CM | POA: Diagnosis not present

## 2021-03-12 DIAGNOSIS — D689 Coagulation defect, unspecified: Secondary | ICD-10-CM | POA: Diagnosis not present

## 2021-03-12 DIAGNOSIS — I129 Hypertensive chronic kidney disease with stage 1 through stage 4 chronic kidney disease, or unspecified chronic kidney disease: Secondary | ICD-10-CM | POA: Diagnosis not present

## 2021-03-12 DIAGNOSIS — N2581 Secondary hyperparathyroidism of renal origin: Secondary | ICD-10-CM | POA: Diagnosis not present

## 2021-03-15 DIAGNOSIS — N2581 Secondary hyperparathyroidism of renal origin: Secondary | ICD-10-CM | POA: Diagnosis not present

## 2021-03-15 DIAGNOSIS — Z992 Dependence on renal dialysis: Secondary | ICD-10-CM | POA: Diagnosis not present

## 2021-03-15 DIAGNOSIS — D689 Coagulation defect, unspecified: Secondary | ICD-10-CM | POA: Diagnosis not present

## 2021-03-15 DIAGNOSIS — N186 End stage renal disease: Secondary | ICD-10-CM | POA: Diagnosis not present

## 2021-03-17 DIAGNOSIS — N2581 Secondary hyperparathyroidism of renal origin: Secondary | ICD-10-CM | POA: Diagnosis not present

## 2021-03-17 DIAGNOSIS — N186 End stage renal disease: Secondary | ICD-10-CM | POA: Diagnosis not present

## 2021-03-17 DIAGNOSIS — D689 Coagulation defect, unspecified: Secondary | ICD-10-CM | POA: Diagnosis not present

## 2021-03-17 DIAGNOSIS — Z992 Dependence on renal dialysis: Secondary | ICD-10-CM | POA: Diagnosis not present

## 2021-03-19 DIAGNOSIS — N186 End stage renal disease: Secondary | ICD-10-CM | POA: Diagnosis not present

## 2021-03-19 DIAGNOSIS — D689 Coagulation defect, unspecified: Secondary | ICD-10-CM | POA: Diagnosis not present

## 2021-03-19 DIAGNOSIS — Z992 Dependence on renal dialysis: Secondary | ICD-10-CM | POA: Diagnosis not present

## 2021-03-19 DIAGNOSIS — N2581 Secondary hyperparathyroidism of renal origin: Secondary | ICD-10-CM | POA: Diagnosis not present

## 2021-03-22 DIAGNOSIS — D689 Coagulation defect, unspecified: Secondary | ICD-10-CM | POA: Diagnosis not present

## 2021-03-22 DIAGNOSIS — N186 End stage renal disease: Secondary | ICD-10-CM | POA: Diagnosis not present

## 2021-03-22 DIAGNOSIS — Z992 Dependence on renal dialysis: Secondary | ICD-10-CM | POA: Diagnosis not present

## 2021-03-22 DIAGNOSIS — N2581 Secondary hyperparathyroidism of renal origin: Secondary | ICD-10-CM | POA: Diagnosis not present

## 2021-03-23 ENCOUNTER — Encounter (HOSPITAL_COMMUNITY)
Admission: RE | Admit: 2021-03-23 | Discharge: 2021-03-23 | Disposition: A | Discharge status: Home / Self Care | Payer: Medicare Other | Source: Ambulatory Visit | Attending: Urology | Admitting: Urology

## 2021-03-23 ENCOUNTER — Encounter (HOSPITAL_COMMUNITY): Payer: Self-pay

## 2021-03-23 ENCOUNTER — Other Ambulatory Visit: Payer: Self-pay

## 2021-03-23 NOTE — Patient Instructions (Signed)
Jeremy Johnson  03/23/2021     @PREFPERIOPPHARMACY @   Your procedure is scheduled on  03/28/2021   Report to [facility] at  [phone]  A.M.   Call this number if you have problems the morning of surgery:  [phone]   Remember:  Do not eat or drink after midnight.                        Take these medicines the morning of surgery with A SIP OF WATER  Allopurinol, pacerone, levothyroxine, metoprolol, omeprazole.   Your last dose of plavix should be 03/22/2021.  Your last dose of eliquis should be 03/25/2021.   Your COVID test is 03/25/2021 at 1:00 PM.   Place clean sheets on your bed the night before your procedure and DO NOT sleep with pets this night.  Shower with CHG the night before and the morning of your procedure and DO NOT use CHG on your face, hair or genitals.  After each shower , dry off with  Clean towel, put on clean, comfortable clothing and brush your teeth.      Do not wear jewelry, make-up or nail polish.  Do not wear lotions, powders, or perfumes, or deodorant.  Do not shave 48 hours prior to surgery.  Men may shave face and neck.  Do not bring valuables to the hospital.  [name] is not responsible for any belongings or valuables.  Contacts, dentures or bridgework may not be worn into surgery.  Leave your suitcase in the car.  After surgery it may be brought to your room.  For patients admitted to the hospital, discharge time will be determined by your treatment team.  Patients discharged the day of surgery will not be allowed to drive home and must have someone with them for 24 hours.   Special instructions:  DO NOT smoke tobacco or vape for 24 hours before your procedure.  Please read over the following fact sheets that you were given. Surgical Site Infection Prevention, Anesthesia Post-op Instructions and Care and Recovery After Surgery       General Anesthesia, Adult, Care After This sheet gives you information about how to  care for yourself after your procedure. Your health care provider may also give you more specific instructions. If you have problems or questions, contact your health care provider. What can I expect after the procedure? After the procedure, the following side effects are common:  Pain or discomfort at the IV site.  Nausea.  Vomiting.  Sore throat.  Trouble concentrating.  Feeling cold or chills.  Feeling weak or tired.  Sleepiness and fatigue.  Soreness and body aches. These side effects can affect parts of the body that were not involved in surgery. Follow these instructions at home: For the time period you were told by your health care provider:  Rest.  Do not participate in activities where you could fall or become injured.  Do not drive or use machinery.  Do not drink alcohol.  Do not take sleeping pills or medicines that cause drowsiness.  Do not make important decisions or sign legal documents.  Do not take care of children on your own.   Eating and drinking  Follow any instructions from your health care provider about eating or drinking restrictions.  When you feel hungry, start by eating small amounts of foods that are soft and easy to digest (bland), such as toast. Gradually  return to your regular diet.  Drink enough fluid to keep your urine pale yellow.  If you vomit, rehydrate by drinking water, juice, or clear broth. General instructions  If you have sleep apnea, surgery and certain medicines can increase your risk for breathing problems. Follow instructions from your health care provider about wearing your sleep device: ? Anytime you are sleeping, including during daytime naps. ? While taking prescription pain medicines, sleeping medicines, or medicines that make you drowsy.  Have a responsible adult stay with you for the time you are told. It is important to have someone help care for you until you are awake and alert.  Return to your normal  activities as told by your health care provider. Ask your health care provider what activities are safe for you.  Take over-the-counter and prescription medicines only as told by your health care provider.  If you smoke, do not smoke without supervision.  Keep all follow-up visits as told by your health care provider. This is important. Contact a health care provider if:  You have nausea or vomiting that does not get better with medicine.  You cannot eat or drink without vomiting.  You have pain that does not get better with medicine.  You are unable to pass urine.  You develop a skin rash.  You have a fever.  You have redness around your IV site that gets worse. Get help right away if:  You have difficulty breathing.  You have chest pain.  You have blood in your urine or stool, or you vomit blood. Summary  After the procedure, it is common to have a sore throat or nausea. It is also common to feel tired.  Have a responsible adult stay with you for the time you are told. It is important to have someone help care for you until you are awake and alert.  When you feel hungry, start by eating small amounts of foods that are soft and easy to digest (bland), such as toast. Gradually return to your regular diet.  Drink enough fluid to keep your urine pale yellow.  Return to your normal activities as told by your health care provider. Ask your health care provider what activities are safe for you. This information is not intended to replace advice given to you by your health care provider. Make sure you discuss any questions you have with your health care provider. Document Revised: 07/15/2020 Document Reviewed: 02/12/2020 Elsevier Patient Education  2021 [place].  Transurethral Resection of Bladder Tumor, Care After This sheet gives you information about how to care for yourself after your procedure. Your health care provider may also give you more specific instructions.  If you have problems or questions, contact your health care provider. What can I expect after the procedure? After the procedure, it is common to have:  A small amount of blood in your urine for up to 2 weeks.  Soreness or mild pain from your catheter. After your catheter is removed, you may have mild soreness, especially when urinating.  Pain in your lower abdomen. Follow these instructions at home: Medicines  Take over-the-counter and prescription medicines only as told by your health care provider.  If you were prescribed an antibiotic medicine, take it as told by your health care provider. Do not stop taking the antibiotic even if you start to feel better.  Do not drive for 24 hours if you were given a sedative during your procedure.  Ask your health care provider if  the medicine prescribed to you: ? Requires you to avoid driving or using heavy machinery. ? Can cause constipation. You may need to take these actions to prevent or treat constipation:  Take over-the-counter or prescription medicines.  Eat foods that are high in fiber, such as beans, whole grains, and fresh fruits and vegetables.  Limit foods that are high in fat and processed sugars, such as fried or sweet foods.   Activity  Return to your normal activities as told by your health care provider. Ask your health care provider what activities are safe for you.  Do not lift anything that is heavier than 10 lb (4.5 kg), or the limit that you are told, until your health care provider says that it is safe.  Avoid intense physical activity for as long as told by your health care provider.  Rest as told by your health care provider.  Avoid sitting for a long time without moving. Get up to take short walks every 1-2 hours. This is important to improve blood flow and breathing. Ask for help if you feel weak or unsteady. General instructions  Do not drink alcohol for as long as told by your health care provider. This is  especially important if you are taking prescription pain medicines.  Do not take baths, swim, or use a hot tub until your health care provider approves. Ask your health care provider if you may take showers. You may only be allowed to take sponge baths.  If you have a catheter, follow instructions from your health care provider about caring for your catheter and your drainage bag.  Drink enough fluid to keep your urine pale yellow.  Wear compression stockings as told by your health care provider. These stockings help to prevent blood clots and reduce swelling in your legs.  Keep all follow-up visits as told by your health care provider. This is important. ? You will need to be followed closely with regular checks of your bladder and urethra (cystoscopies) to make sure that the cancer does not come back.   Contact a health care provider if:  You have pain that gets worse or does not improve with medicine.  You have blood in your urine for more than 2 weeks.  You have cloudy or bad-smelling urine.  You become constipated. Signs of constipation may include having: ? Fewer than three bowel movements in a week. ? Difficulty having a bowel movement. ? Stools that are dry, hard, or larger than normal.  You have a fever. Get help right away if:  You have: ? Severe pain. ? Bright red blood in your urine. ? Blood clots in your urine. ? A lot of blood in your urine.  Your catheter has been removed and you are not able to urinate.  You have a catheter in place and the catheter is not draining urine. Summary  After your procedure, it is common to have a small amount of blood in your urine, soreness or mild pain from your catheter, and pain in your lower abdomen.  Take over-the-counter and prescription medicines only as told by your health care provider.  Rest as told by your health care provider. Follow your health care provider's instructions about returning to normal activities. Ask  what activities are safe for you.  If you have a catheter, follow instructions from your health care provider about caring for your catheter and your drainage bag.  Get help right away if you cannot urinate, you have severe pain, or  you have bright red blood or blood clots in your urine. This information is not intended to replace advice given to you by your health care provider. Make sure you discuss any questions you have with your health care provider. Document Revised: 05/30/2018 Document Reviewed: 05/30/2018 Elsevier Patient Education  [place].

## 2021-03-24 DIAGNOSIS — N2581 Secondary hyperparathyroidism of renal origin: Secondary | ICD-10-CM | POA: Diagnosis not present

## 2021-03-24 DIAGNOSIS — Z992 Dependence on renal dialysis: Secondary | ICD-10-CM | POA: Diagnosis not present

## 2021-03-24 DIAGNOSIS — D689 Coagulation defect, unspecified: Secondary | ICD-10-CM | POA: Diagnosis not present

## 2021-03-24 DIAGNOSIS — N186 End stage renal disease: Secondary | ICD-10-CM | POA: Diagnosis not present

## 2021-03-25 ENCOUNTER — Other Ambulatory Visit: Payer: Self-pay

## 2021-03-25 ENCOUNTER — Other Ambulatory Visit (HOSPITAL_COMMUNITY)
Admission: RE | Admit: 2021-03-25 | Discharge: 2021-03-25 | Disposition: A | Discharge status: Home / Self Care | Payer: Medicare Other | Source: Ambulatory Visit | Attending: Urology | Admitting: Urology

## 2021-03-25 DIAGNOSIS — Z20822 Contact with and (suspected) exposure to covid-19: Secondary | ICD-10-CM | POA: Diagnosis not present

## 2021-03-25 DIAGNOSIS — Z01812 Encounter for preprocedural laboratory examination: Secondary | ICD-10-CM | POA: Diagnosis not present

## 2021-03-25 LAB — SARS CORONAVIRUS 2 (TAT 6-24 HRS): SARS Coronavirus 2: NEGATIVE

## 2021-03-26 DIAGNOSIS — Z992 Dependence on renal dialysis: Secondary | ICD-10-CM | POA: Diagnosis not present

## 2021-03-26 DIAGNOSIS — D689 Coagulation defect, unspecified: Secondary | ICD-10-CM | POA: Diagnosis not present

## 2021-03-26 DIAGNOSIS — N186 End stage renal disease: Secondary | ICD-10-CM | POA: Diagnosis not present

## 2021-03-26 DIAGNOSIS — N2581 Secondary hyperparathyroidism of renal origin: Secondary | ICD-10-CM | POA: Diagnosis not present

## 2021-03-28 ENCOUNTER — Encounter (HOSPITAL_COMMUNITY): Payer: Self-pay | Admitting: Urology

## 2021-03-28 ENCOUNTER — Ambulatory Visit (HOSPITAL_COMMUNITY)
Admission: RE | Admit: 2021-03-28 | Discharge: 2021-03-28 | Disposition: A | Discharge status: Home / Self Care | Payer: Medicare Other | Attending: Urology | Admitting: Urology

## 2021-03-28 ENCOUNTER — Ambulatory Visit (HOSPITAL_COMMUNITY): Payer: Medicare Other | Admitting: Anesthesiology

## 2021-03-28 ENCOUNTER — Encounter (HOSPITAL_COMMUNITY)
Admission: RE | Disposition: A | Discharge status: Home / Self Care | Payer: Self-pay | Source: Home / Self Care | Attending: Urology

## 2021-03-28 ENCOUNTER — Ambulatory Visit (HOSPITAL_COMMUNITY): Payer: Medicare Other

## 2021-03-28 DIAGNOSIS — D494 Neoplasm of unspecified behavior of bladder: Secondary | ICD-10-CM

## 2021-03-28 DIAGNOSIS — Z955 Presence of coronary angioplasty implant and graft: Secondary | ICD-10-CM | POA: Insufficient documentation

## 2021-03-28 DIAGNOSIS — Z87891 Personal history of nicotine dependence: Secondary | ICD-10-CM | POA: Insufficient documentation

## 2021-03-28 DIAGNOSIS — C679 Malignant neoplasm of bladder, unspecified: Secondary | ICD-10-CM | POA: Insufficient documentation

## 2021-03-28 DIAGNOSIS — J449 Chronic obstructive pulmonary disease, unspecified: Secondary | ICD-10-CM | POA: Diagnosis not present

## 2021-03-28 DIAGNOSIS — Z85118 Personal history of other malignant neoplasm of bronchus and lung: Secondary | ICD-10-CM | POA: Diagnosis not present

## 2021-03-28 DIAGNOSIS — Z880 Allergy status to penicillin: Secondary | ICD-10-CM | POA: Insufficient documentation

## 2021-03-28 DIAGNOSIS — Z888 Allergy status to other drugs, medicaments and biological substances status: Secondary | ICD-10-CM | POA: Insufficient documentation

## 2021-03-28 DIAGNOSIS — Z801 Family history of malignant neoplasm of trachea, bronchus and lung: Secondary | ICD-10-CM | POA: Diagnosis not present

## 2021-03-28 HISTORY — PX: CYSTOSCOPY W/ RETROGRADES: SHX1426

## 2021-03-28 HISTORY — PX: TRANSURETHRAL RESECTION OF BLADDER TUMOR: SHX2575

## 2021-03-28 LAB — BASIC METABOLIC PANEL
Anion gap: 11 (ref 5–15)
BUN: 36 mg/dL — ABNORMAL HIGH (ref 8–23)
CO2: 27 mmol/L (ref 22–32)
Calcium: 8.6 mg/dL — ABNORMAL LOW (ref 8.9–10.3)
Chloride: 94 mmol/L — ABNORMAL LOW (ref 98–111)
Creatinine, Ser: 10.39 mg/dL — ABNORMAL HIGH (ref 0.61–1.24)
GFR, Estimated: 5 mL/min — ABNORMAL LOW (ref >60)
Glucose, Bld: 89 mg/dL (ref 70–99)
Potassium: 4.9 mmol/L (ref 3.5–5.1)
Sodium: 132 mmol/L — ABNORMAL LOW (ref 135–145)

## 2021-03-28 SURGERY — CYSTOSCOPY, WITH RETROGRADE PYELOGRAM
Anesthesia: General | Site: Ureter

## 2021-03-28 MED ORDER — SODIUM CHLORIDE 0.9 % IR SOLN
Status: DC | PRN
  Administered 2021-03-28 (×2): 1000 mL via INTRAVESICAL

## 2021-03-28 MED ORDER — DEXAMETHASONE SODIUM PHOSPHATE 10 MG/ML IJ SOLN
INTRAMUSCULAR | Status: AC
  Filled 2021-03-28: qty 1

## 2021-03-28 MED ORDER — STERILE WATER FOR IRRIGATION IR SOLN
Status: DC | PRN
  Administered 2021-03-28: 500 mL

## 2021-03-28 MED ORDER — SEVOFLURANE IN SOLN
RESPIRATORY_TRACT | Status: AC
  Filled 2021-03-28: qty 250

## 2021-03-28 MED ORDER — CHLORHEXIDINE GLUCONATE 0.12 % MT SOLN: 15.0000 mL | Freq: Once | OROMUCOSAL | Status: DC

## 2021-03-28 MED ORDER — PROPOFOL 10 MG/ML IV BOLUS
INTRAVENOUS | Status: DC | PRN
  Administered 2021-03-28: 40 mg via INTRAVENOUS
  Administered 2021-03-28: 130 mg via INTRAVENOUS

## 2021-03-28 MED ORDER — DIATRIZOATE MEGLUMINE 30 % UR SOLN
URETHRAL | Status: DC | PRN
  Administered 2021-03-28: 7 mL via URETHRAL

## 2021-03-28 MED ORDER — FENTANYL CITRATE (PF) 100 MCG/2ML IJ SOLN: 25.0000 ug | INTRAMUSCULAR | Status: DC | PRN

## 2021-03-28 MED ORDER — ROCURONIUM BROMIDE 10 MG/ML (PF) SYRINGE
PREFILLED_SYRINGE | INTRAVENOUS | Status: AC
  Filled 2021-03-28: qty 10

## 2021-03-28 MED ORDER — ONDANSETRON HCL 4 MG/2ML IJ SOLN
INTRAMUSCULAR | Status: AC
  Filled 2021-03-28: qty 2

## 2021-03-28 MED ORDER — DIATRIZOATE MEGLUMINE 30 % UR SOLN
URETHRAL | Status: AC
  Filled 2021-03-28: qty 100

## 2021-03-28 MED ORDER — LACTATED RINGERS IV SOLN: INTRAVENOUS | Status: DC

## 2021-03-28 MED ORDER — SODIUM CHLORIDE 0.9 % IV SOLN: INTRAVENOUS | Status: DC | PRN

## 2021-03-28 MED ORDER — ONDANSETRON HCL 4 MG/2ML IJ SOLN
INTRAMUSCULAR | Status: AC
  Filled 2021-03-28: qty 4

## 2021-03-28 MED ORDER — EPHEDRINE SULFATE 50 MG/ML IJ SOLN
INTRAMUSCULAR | Status: DC | PRN
  Administered 2021-03-28: 10 mg via INTRAVENOUS

## 2021-03-28 MED ORDER — SUGAMMADEX SODIUM 500 MG/5ML IV SOLN
INTRAVENOUS | Status: DC | PRN
  Administered 2021-03-28: 200 mg via INTRAVENOUS

## 2021-03-28 MED ORDER — FENTANYL CITRATE (PF) 100 MCG/2ML IJ SOLN
INTRAMUSCULAR | Status: AC
  Filled 2021-03-28: qty 2

## 2021-03-28 MED ORDER — ORAL CARE MOUTH RINSE: 15.0000 mL | Freq: Once | OROMUCOSAL | Status: DC

## 2021-03-28 MED ORDER — CEFAZOLIN SODIUM-DEXTROSE 2-4 GM/100ML-% IV SOLN
2.0000 g | INTRAVENOUS | Status: AC
  Administered 2021-03-28: 2 g via INTRAVENOUS
  Filled 2021-03-28: qty 100

## 2021-03-28 MED ORDER — PROPOFOL 10 MG/ML IV BOLUS
INTRAVENOUS | Status: AC
  Filled 2021-03-28: qty 40

## 2021-03-28 MED ORDER — ONDANSETRON HCL 4 MG/2ML IJ SOLN
INTRAMUSCULAR | Status: DC | PRN
  Administered 2021-03-28: 4 mg via INTRAVENOUS

## 2021-03-28 MED ORDER — HYDROCODONE-ACETAMINOPHEN 5-325 MG PO TABS: 1.0000 | ORAL_TABLET | ORAL | 0 refills | Status: DC | PRN

## 2021-03-28 MED ORDER — ROCURONIUM BROMIDE 100 MG/10ML IV SOLN
INTRAVENOUS | Status: DC | PRN
  Administered 2021-03-28: 40 mg via INTRAVENOUS

## 2021-03-28 MED ORDER — FENTANYL CITRATE (PF) 100 MCG/2ML IJ SOLN
INTRAMUSCULAR | Status: DC | PRN
  Administered 2021-03-28: 25 ug via INTRAVENOUS
  Administered 2021-03-28: 50 ug via INTRAVENOUS

## 2021-03-28 MED ORDER — ONDANSETRON HCL 4 MG/2ML IJ SOLN: 4.0000 mg | Freq: Once | INTRAMUSCULAR | Status: DC | PRN

## 2021-03-28 SURGICAL SUPPLY — 27 items
BAG DRAIN URO TABLE W/ADPT NS (BAG) ×3 IMPLANT
BAG DRN 8 ADPR NS SKTRN CSTL (BAG) ×2
BAG DRN RND TRDRP ANRFLXCHMBR (UROLOGICAL SUPPLIES) ×2
BAG HAMPER (MISCELLANEOUS) ×3 IMPLANT
BAG URINE DRAIN 2000ML AR STRL (UROLOGICAL SUPPLIES) ×3 IMPLANT
CATH FOLEY 3WAY 30CC 22F (CATHETERS) ×1 IMPLANT
CATH INTERMIT  6FR 70CM (CATHETERS) ×3 IMPLANT
CLOTH BEACON ORANGE TIMEOUT ST (SAFETY) ×3 IMPLANT
DECANTER SPIKE VIAL GLASS SM (MISCELLANEOUS) ×3 IMPLANT
ELECT LOOP 22F BIPOLAR SML (ELECTROSURGICAL) ×3
ELECTRODE LOOP 22F BIPOLAR SML (ELECTROSURGICAL) ×2 IMPLANT
GLOVE BIO SURGEON STRL SZ8 (GLOVE) ×3 IMPLANT
GLOVE SURG UNDER POLY LF SZ7 (GLOVE) ×6 IMPLANT
GOWN STRL REUS W/TWL LRG LVL3 (GOWN DISPOSABLE) ×3 IMPLANT
GOWN STRL REUS W/TWL XL LVL3 (GOWN DISPOSABLE) ×3 IMPLANT
GUIDEWIRE STR DUAL SENSOR (WIRE) ×1 IMPLANT
IV NS IRRIG 3000ML ARTHROMATIC (IV SOLUTION) ×4 IMPLANT
KIT TURNOVER CYSTO (KITS) ×3 IMPLANT
MANIFOLD NEPTUNE II (INSTRUMENTS) ×3 IMPLANT
PACK CYSTO (CUSTOM PROCEDURE TRAY) ×3 IMPLANT
PAD ARMBOARD 7.5X6 YLW CONV (MISCELLANEOUS) ×4 IMPLANT
PLUG CATH AND CAP STER (CATHETERS) ×3 IMPLANT
SYR 30ML LL (SYRINGE) ×3 IMPLANT
SYR TOOMEY IRRIG 70ML (MISCELLANEOUS) ×3
SYRINGE TOOMEY IRRIG 70ML (MISCELLANEOUS) ×2 IMPLANT
TOWEL OR 17X26 4PK STRL BLUE (TOWEL DISPOSABLE) ×3 IMPLANT
WATER STERILE IRR 500ML POUR (IV SOLUTION) ×3 IMPLANT

## 2021-03-28 NOTE — Transfer of Care (Signed)
Immediate Anesthesia Transfer of Care Note  Patient: Jeremy Johnson  Procedure(s) Performed: CYSTOSCOPY WITH RETROGRADE PYELOGRAM (Bilateral Ureter) TRANSURETHRAL RESECTION OF BLADDER TUMOR (TURBT) (N/A Bladder)  Patient Location: PACU  Anesthesia Type:General  Level of Consciousness: awake, alert  and patient cooperative  Airway & Oxygen Therapy: Patient Spontanous Breathing and Patient connected to nasal cannula oxygen  Post-op Assessment: Report given to RN and Post -op Vital signs reviewed and stable  Post vital signs: Reviewed and stable  Last Vitals:  Vitals Value Taken Time  BP 151/80 03/28/21 1102  Temp 98.1   Pulse 77 03/28/21 1106  Resp 24 03/28/21 1106  SpO2 98 % 03/28/21 1106  Vitals shown include unvalidated device data.  Last Pain:  Vitals:   03/28/21 0702  TempSrc: Oral  PainSc: 0-No pain      Patients Stated Pain Goal: 5 (73/22/56 7209)  Complications: No complications documented.

## 2021-03-28 NOTE — Anesthesia Procedure Notes (Signed)
Procedure Name: Intubation Date/Time: 03/28/2021 10:20 AM Performed by: Vista Deck, CRNA Pre-anesthesia Checklist: Patient identified, Patient being monitored, Timeout performed, Emergency Drugs available and Suction available Patient Re-evaluated:Patient Re-evaluated prior to induction Oxygen Delivery Method: Circle System Utilized Preoxygenation: Pre-oxygenation with 100% oxygen Induction Type: IV induction Ventilation: Mask ventilation without difficulty Laryngoscope Size: Mac and 3 Grade View: Grade II Tube type: Oral Tube size: 7.5 mm Number of attempts: 1 Airway Equipment and Method: stylet and Oral airway Placement Confirmation: ETT inserted through vocal cords under direct vision,  positive ETCO2 and breath sounds checked- equal and bilateral Secured at: 21 cm Tube secured with: Tape Dental Injury: Teeth and Oropharynx as per pre-operative assessment

## 2021-03-28 NOTE — Op Note (Signed)
.  Preoperative diagnosis: bladder tumor  Postoperative diagnosis: Same  Procedure: 1 cystoscopy 2. Right retrograde pyelography 3.  Intraoperative fluoroscopy, under one hour, with interpretation 4. Transurethral resection of bladder tumor, large  Attending: Nicolette Bang  Anesthesia: General  Estimated blood loss: Minimal  Drains: 22 French foley  Specimens: bladder tumor  Antibiotics: ancef  Findings: entire surface of the bladder covered with papillary tumor. Right ureteral orifice identified. No right hydronephrosis.  Unable to identify left ureteral orifice.  Indications: Patient is a 75 year old male with a history of bladder tumor and gross hematuria.  After discussing treatment options, they decided proceed with transurethral resection of a bladder tumor.  Procedure her in detail: The patient was brought to the operating room and a brief timeout was done to ensure correct patient, correct procedure, correct site.  General anesthesia was administered patient was placed in dorsal lithotomy position.  Their genitalia was then prepped and draped in usual sterile fashion.  A rigid 60 French cystoscope was passed in the urethra and the bladder.  Bladder was inspected and we noted papillary bladder tumor covering entire surface of the bladder.  We identified the right ureteral orifice but were unable to identify the left ureteral orifice. a 6 french ureteral catheter was then instilled into the right ureteral orifice.  a gentle retrograde was obtained and findings noted above. We then turned our attention to the right side. We then removed the cystoscope and placed a resectoscope into the bladder. Using the bipolar resectoscope we removed the bladder tumor down to the base. A subsequent muscle deep biopsy was then taken. Hemostasis was then obtained with electrocautery. We then removed the bladder tumor chips and sent them for pathology. We then re-inspected the bladder and found no  residula bleeding.  the bladder was then drained, a 22 French foley was placed and this concluded the procedure which was well tolerated by patient.  Complications: None  Condition: Stable, extubated, transferred to PACU  Plan: Patient is to be discharged home and followup in 5 days for foley catheter removal and pathology discussion.

## 2021-03-28 NOTE — Anesthesia Preprocedure Evaluation (Signed)
Anesthesia Evaluation  Patient identified by MRN, date of birth, ID band Patient awake    Reviewed: Allergy & Precautions, H&P , NPO status , Patient's Chart, lab work & pertinent test results, reviewed documented beta blocker date and time   Airway Mallampati: II  TM Distance: >3 FB Neck ROM: full    Dental no notable dental hx. (+) Teeth Intact   Pulmonary COPD, former smoker,    Pulmonary exam normal breath sounds clear to auscultation       Cardiovascular Exercise Tolerance: Good hypertension, + CAD, + Past MI, + Cardiac Stents and +CHF   Rhythm:regular Rate:Normal     Neuro/Psych negative neurological ROS  negative psych ROS   GI/Hepatic Neg liver ROS, GERD  Medicated,  Endo/Other  Hypothyroidism   Renal/GU ESRF and DialysisRenal disease (Dialysis T-Th-Sat)  negative genitourinary   Musculoskeletal   Abdominal   Peds  Hematology  (+) Blood dyscrasia, anemia ,   Anesthesia Other Findings 1. Left ventricular ejection fraction, by estimation, is 25 to 30%. The left ventricle has severely decreased function. The left ventricle demonstrates global hypokinesis. The left ventricular internal cavity size was moderately to severely dilated. Left ventricular diastolic parameters are indeterminate. 2. Right ventricular systolic function is normal. The right ventricular size is normal. Tricuspid regurgitation signal is inadequate for assessing PA pressure. 3. Left atrial size was moderately dilated. 4. Right atrial size was mildly dilated. 5. The mitral valve is grossly normal. Trivial mitral valve regurgitation. 6. The aortic valve is tricuspid. Aortic valve regurgitation is not visualized. 7. Aortic dilatation noted. There is borderline dilatation of the aortic root, measuring 39 mm. 8. The inferior vena cava is normal in size with greater than 50% respiratory variability, suggesting right atrial pressure of 3 mmHg.   Reproductive/Obstetrics negative OB ROS                             Anesthesia Physical Anesthesia Plan  ASA: IV  Anesthesia Plan: General   Post-op Pain Management:    Induction:   PONV Risk Score and Plan: Ondansetron  Airway Management Planned:   Additional Equipment:   Intra-op Plan:   Post-operative Plan:   Informed Consent: I have reviewed the patients History and Physical, chart, labs and discussed the procedure including the risks, benefits and alternatives for the proposed anesthesia with the patient or authorized representative who has indicated his/her understanding and acceptance.     Dental Advisory Given  Plan Discussed with: CRNA  Anesthesia Plan Comments:         Anesthesia Quick Evaluation

## 2021-03-28 NOTE — Anesthesia Postprocedure Evaluation (Signed)
Anesthesia Post Note  Patient: Jeremy Johnson  Procedure(s) Performed: CYSTOSCOPY WITH RETROGRADE PYELOGRAM (Bilateral Ureter) TRANSURETHRAL RESECTION OF BLADDER TUMOR (TURBT) (N/A Bladder)  Patient location during evaluation: Phase II Anesthesia Type: General Level of consciousness: awake Pain management: pain level controlled Vital Signs Assessment: post-procedure vital signs reviewed and stable Respiratory status: spontaneous breathing and respiratory function stable Cardiovascular status: blood pressure returned to baseline and stable Postop Assessment: no headache and no apparent nausea or vomiting Anesthetic complications: no Comments: Late entry   No complications documented.   Last Vitals:  Vitals:   03/28/21 1130 03/28/21 1151  BP: (!) 152/83 (!) 143/78  Pulse: 72 77  Resp: (!) 29 17  Temp:  36.7 C  SpO2: 95% 96%    Last Pain:  Vitals:   03/28/21 1151  TempSrc: Oral  PainSc: [place]

## 2021-03-28 NOTE — Discharge Instructions (Signed)
DR. Alyson Ingles WILL SEE YOU IN THE OFFICE ON  Friday MAY 20 @ 8:30 AM  DO NOT RESUME PLAVIX, ELIQUIS UNTIL YOU SEE DR. Alyson Ingles IN THE OFFICE.   Transurethral Resection of Bladder Tumor, Care After  This sheet gives you information about how to care for yourself after your procedure. Your health care provider may also give you more specific instructions. If you have problems or questions, contact your health care provider. What can I expect after the procedure? After the procedure, it is common to have:  A small amount of blood in your urine for up to 2 weeks.  Soreness or mild pain from your catheter. After your catheter is removed, you may have mild soreness, especially when urinating.  Pain in your lower abdomen. Follow these instructions at home: Medicines  Take over-the-counter and prescription medicines only as told by your health care provider.  If you were prescribed an antibiotic medicine, take it as told by your health care provider. Do not stop taking the antibiotic even if you start to feel better.  Do not drive for 24 hours if you were given a sedative during your procedure.  Ask your health care provider if the medicine prescribed to you: ? Requires you to avoid driving or using heavy machinery. ? Can cause constipation. You may need to take these actions to prevent or treat constipation:  Take over-the-counter or prescription medicines.  Eat foods that are high in fiber, such as beans, whole grains, and fresh fruits and vegetables.  Limit foods that are high in fat and processed sugars, such as fried or sweet foods.   Activity  Return to your normal activities as told by your health care provider. Ask your health care provider what activities are safe for you.  Do not lift anything that is heavier than 10 lb (4.5 kg), or the limit that you are told, until your health care provider says that it is safe.  Avoid intense physical activity for as long as told by your  health care provider.  Rest as told by your health care provider.  Avoid sitting for a long time without moving. Get up to take short walks every 1-2 hours. This is important to improve blood flow and breathing. Ask for help if you feel weak or unsteady. General instructions  Do not drink alcohol for as long as told by your health care provider. This is especially important if you are taking prescription pain medicines.  Do not take baths, swim, or use a hot tub until your health care provider approves. Ask your health care provider if you may take showers. You may only be allowed to take sponge baths.  If you have a catheter, follow instructions from your health care provider about caring for your catheter and your drainage bag.  Drink enough fluid to keep your urine pale yellow.  Wear compression stockings as told by your health care provider. These stockings help to prevent blood clots and reduce swelling in your legs.  Keep all follow-up visits as told by your health care provider. This is important. ? You will need to be followed closely with regular checks of your bladder and urethra (cystoscopies) to make sure that the cancer does not come back.   Contact a health care provider if:  You have pain that gets worse or does not improve with medicine.  You have blood in your urine for more than 2 weeks.  You have cloudy or bad-smelling urine.  You become  constipated. Signs of constipation may include having: ? Fewer than three bowel movements in a week. ? Difficulty having a bowel movement. ? Stools that are dry, hard, or larger than normal.  You have a fever. Get help right away if:  You have: ? Severe pain. ? Bright red blood in your urine. ? Blood clots in your urine. ? A lot of blood in your urine.  Your catheter has been removed and you are not able to urinate.  You have a catheter in place and the catheter is not draining urine. Summary  After your procedure, it  is common to have a small amount of blood in your urine, soreness or mild pain from your catheter, and pain in your lower abdomen.  Take over-the-counter and prescription medicines only as told by your health care provider.  Rest as told by your health care provider. Follow your health care provider's instructions about returning to normal activities. Ask what activities are safe for you.  If you have a catheter, follow instructions from your health care provider about caring for your catheter and your drainage bag.  Get help right away if you cannot urinate, you have severe pain, or you have bright red blood or blood clots in your urine. This information is not intended to replace advice given to you by your health care provider. Make sure you discuss any questions you have with your health care provider. Document Revised: 05/30/2018 Document Reviewed: 05/30/2018 Elsevier Patient Education  2021 [place].  PATIENT INSTRUCTIONS POST-ANESTHESIA  IMMEDIATELY FOLLOWING SURGERY:  Do not drive or operate machinery for the first twenty four hours after surgery.  Do not make any important decisions for twenty four hours after surgery or while taking narcotic pain medications or sedatives.  If you develop intractable nausea and vomiting or a severe headache please notify your doctor immediately.  FOLLOW-UP:  Please make an appointment with your surgeon as instructed. You do not need to follow up with anesthesia unless specifically instructed to do so.  WOUND CARE INSTRUCTIONS (if applicable):  Keep a dry clean dressing on the anesthesia/puncture wound site if there is drainage.  Once the wound has quit draining you may leave it open to air.  Generally you should leave the bandage intact for twenty four hours unless there is drainage.  If the epidural site drains for more than 36-48 hours please call the anesthesia department.  QUESTIONS?:  Please feel free to call your physician or the hospital  operator if you have any questions, and they will be happy to assist you.   Indwelling Urinary Catheter Care, Adult An indwelling urinary catheter is a thin tube that is put into your bladder. The tube helps to drain pee (urine) out of your body. The tube goes in through your urethra. Your urethra is where pee comes out of your body. Your pee will come out through the catheter, then it will go into a bag (drainage bag). Take good care of your catheter so it will work well. How to wear your catheter and bag Supplies needed  Sticky tape (adhesive tape) or a leg strap.  Alcohol wipe or soap and water (if you use tape).  A clean towel (if you use tape).  Large overnight bag.  Smaller bag (leg bag). Wearing your catheter Attach your catheter to your leg with tape or a leg strap.  Make sure the catheter is not pulled tight.  If a leg strap gets wet, take it off and put  on a dry strap.  If you use tape to hold the bag on your leg: 1. Use an alcohol wipe or soap and water to wash your skin where the tape made it sticky before. 2. Use a clean towel to pat-dry that skin. 3. Use new tape to make the bag stay on your leg. Wearing your bags You should have been given a large overnight bag.  You may wear the overnight bag in the day or night.  Always have the overnight bag lower than your bladder.  Do not let the bag touch the floor.  Before you go to sleep, put a clean plastic bag in a wastebasket. Then hang the overnight bag inside the wastebasket. You should also have a smaller leg bag that fits under your clothes.  Always wear the leg bag below your knee.  Do not wear your leg bag at night. How to care for your skin and catheter Supplies needed  A clean washcloth.  Water and mild soap.  A clean towel. Caring for your skin and catheter  Clean the skin around your catheter every day: 1. Wash your hands with soap and water. 2. Wet a clean washcloth in warm water and mild  soap. 3. Clean the skin around your urethra.  If you are male:  Gently spread the folds of skin around your vagina (labia).  With the washcloth in your other hand, wipe the inner side of your labia on each side. Wipe from front to back.  If you are male:  Pull back any skin that covers the end of your penis (foreskin).  With the washcloth in your other hand, wipe your penis in small circles. Start wiping at the tip of your penis, then move away from the catheter.  Move the foreskin back in place, if needed. 4. With your free hand, hold the catheter close to where it goes into your body.  Keep holding the catheter during cleaning so it does not get pulled out. 5. With the washcloth in your other hand, clean the catheter.  Only wipe downward on the catheter.  Do not wipe upward toward your body. Doing this may push germs into your urethra and cause infection. 6. Use a clean towel to pat-dry the catheter and the skin around it. Make sure to wipe off all soap. 7. Wash your hands with soap and water.  Shower every day. Do not take baths.  Do not use cream, ointment, or lotion on the area where the catheter goes into your body, unless your doctor tells you to.  Do not use powders, sprays, or lotions on your genital area.  Check your skin around the catheter every day for signs of infection. Check for: ? Redness, swelling, or pain. ? Fluid or blood. ? Warmth. ? Pus or a bad smell.      How to empty the bag Supplies needed  Rubbing alcohol.  Gauze pad or cotton ball.  Tape or a leg strap. Emptying the bag Pour the pee out of your bag when it is ?- full, or at least 2-3 times a day. Do this for your overnight bag and your leg bag. 1. Wash your hands with soap and water. 2. Separate (detach) the bag from your leg. 3. Hold the bag over the toilet or a clean pail. Keep the bag lower than your hips and bladder. This is so the pee (urine) does not go back into the  tube. 4. Open the pour spout. It is  at the bottom of the bag. 5. Empty the pee into the toilet or pail. Do not let the pour spout touch any surface. 6. Put rubbing alcohol on a gauze pad or cotton ball. 7. Use the gauze pad or cotton ball to clean the pour spout. 8. Close the pour spout. 9. Attach the bag to your leg with tape or a leg strap. 10. Wash your hands with soap and water. Follow instructions for cleaning the drainage bag:  From the product maker.  As told by your doctor. How to change the bag Supplies needed  Alcohol wipes.  A clean bag.  Tape or a leg strap. Changing the bag Replace your bag when it starts to leak, smell bad, or look dirty. 1. Wash your hands with soap and water. 2. Separate the dirty bag from your leg. 3. Pinch the catheter with your fingers so that pee does not spill out. 4. Separate the catheter tube from the bag tube where these tubes connect (at the connection valve). Do not let the tubes touch any surface. 5. Clean the end of the catheter tube with an alcohol wipe. Use a different alcohol wipe to clean the end of the bag tube. 6. Connect the catheter tube to the tube of the clean bag. 7. Attach the clean bag to your leg with tape or a leg strap. Do not make the bag tight on your leg. 8. Wash your hands with soap and water. General rules  Never pull on your catheter. Never try to take it out. Doing that can hurt you.  Always wash your hands before and after you touch your catheter or bag. Use a mild, fragrance-free soap. If you do not have soap and water, use hand sanitizer.  Always make sure there are no twists or bends (kinks) in the catheter tube.  Always make sure there are no leaks in the catheter or bag.  Drink enough fluid to keep your pee pale yellow.  Do not take baths, swim, or use a hot tub.  If you are male, wipe from front to back after you poop (have a bowel movement).   Contact a doctor if:  Your pee is cloudy.  Your  pee smells worse than usual.  Your catheter gets clogged.  Your catheter leaks.  Your bladder feels full. Get help right away if:  You have redness, swelling, or pain where the catheter goes into your body.  You have fluid, blood, pus, or a bad smell coming from the area where the catheter goes into your body.  Your skin feels warm where the catheter goes into your body.  You have a fever.  You have pain in your: ? Belly (abdomen). ? Legs. ? Lower back. ? Bladder.  You see blood in the catheter.  Your pee is pink or red.  You feel sick to your stomach (nauseous).  You throw up (vomit).  You have chills.  Your pee is not draining into the bag.  Your catheter gets pulled out. Summary  An indwelling urinary catheter is a thin tube that is placed into the bladder to help drain pee (urine) out of the body.  The catheter is placed into the part of the body that drains pee from the bladder (urethra).  Taking good care of your catheter will keep it working properly and help prevent problems.  Always wash your hands before and after touching your catheter or bag.  Never pull on your catheter or try to take  it out. This information is not intended to replace advice given to you by your health care provider. Make sure you discuss any questions you have with your health care provider. Document Revised: 02/21/2019 Document Reviewed: 06/15/2017 Elsevier Patient Education  [place].

## 2021-03-28 NOTE — H&P (Signed)
Urology Admission H&P  Chief Complaint: gross hematuria  History of Present Illness: [name] is a 75yo with gross hematuria and a positive urine cytology who was found to have a bladder tumor on office cystoscopy.  Past Medical History:  Diagnosis Date  . Anemia   . Blood transfusion without reported diagnosis   . CAD (coronary artery disease)    STENT... MID CIRCUMFLEX...1997  . Chronic kidney disease    STAGE 3  . COPD (chronic obstructive pulmonary disease) ([place])   . Degenerative joint disease (DJD) of lumbar spine   . GERD (gastroesophageal reflux disease)   . Gout   . Hyperlipidemia   . Hypertension   . Hypothyroidism   . Incisional hernia    abdomen  . Leukocytosis    CHRONIC MILD  . Myocardial infarction ([place])    1997  . SCL CA dx'd 01/2019   Lung cancer   Past Surgical History:  Procedure Laterality Date  . ABDOMINAL AORTIC ANEURYSM REPAIR  2006  . AV FISTULA PLACEMENT Left 04/25/2019   Procedure: ARTERIOVENOUS (AV) FISTULA CREATION LEFT ARM;  Surgeon: Angelia Mould, MD;  Location: [facility];  Service: Vascular;  Laterality: Left;  . AV FISTULA PLACEMENT Left 05/19/2019   Procedure: CONVERSION OF LEFT ARM ARTERIOVENOUS FISTULA TO GRAFT;  Surgeon: Angelia Mould, MD;  Location: [name];  Service: Vascular;  Laterality: Left;  . BIOPSY  09/30/2018   Procedure: BIOPSY;  Surgeon: Danie Binder, MD;  [facility];  Service: Endoscopy;;  ascending colon  . BIOPSY  07/27/2020   Procedure: BIOPSY;  Surgeon: Eloise Harman, DO;  [facility];  Service: Endoscopy;;  gastric  . BUBBLE STUDY  09/23/2020   Procedure: BUBBLE STUDY;  Surgeon: Skeet Latch, MD;  Location: [facility];  Service: Cardiovascular;;  . CARDIOVERSION N/A 09/23/2020   Procedure: CARDIOVERSION;  Surgeon: Skeet Latch, MD;  Location: [facility];  Service: Cardiovascular;  Laterality: N/A;  . COLONOSCOPY  2008  . COLONOSCOPY N/A 09/30/2018   External and  internal hemorrhoids, six polyps removed, one ascending colon polypoid lesion biopsied. Six simple adenomas and one benign polypoid lesion. Colonoscopy Nov 2022.   [name] COLONOSCOPY WITH PROPOFOL N/A 07/27/2020   non-bleeding internal hemorrhoids, sigmoid and descending colon diverticulosis, four 1-2 mm polyps in ascending colon, one 5 mm polyp in transverse colon. 3 year surveillance. Tubular adenomas.   . CORONARY ANGIOPLASTY WITH STENT PLACEMENT  1997   MID CIRCUMFLEX  . CORONARY STENT INTERVENTION N/A 09/20/2020   Procedure: CORONARY STENT INTERVENTION;  Surgeon: Martinique, Peter M, MD;  Location: [facility];  Service: Cardiovascular;  Laterality: N/A;  . ESOPHAGOGASTRODUODENOSCOPY (EGD) WITH PROPOFOL N/A 07/27/2020   Food in middle third of esophagus, gastritis s/p biopsy, nodular mucosa in lesser curvature of stomach s/p biopsy. Negative H.pylori.   [name] HIP ARTHROPLASTY Right 04/30/2020   Procedure: ARTHROPLASTY  HIP (HEMIARTHROPLASTY);  Surgeon: Altamese Allenwood, MD;  Location: [facility];  Service: Orthopedics;  Laterality: Right;  . INTRAVASCULAR ULTRASOUND/IVUS N/A 09/20/2020   Procedure: Intravascular Ultrasound/IVUS;  Surgeon: Martinique, Peter M, MD;  Location: [facility];  Service: Cardiovascular;  Laterality: N/A;  . IR FLUORO GUIDE CV LINE RIGHT  04/22/2019  . IR FLUORO GUIDE CV LINE RIGHT  05/01/2020  . IR THORACENTESIS ASP PLEURAL SPACE W/IMG GUIDE  09/22/2020  . IR THROMBECTOMY AV FISTULA W/THROMBOLYSIS/PTA INC/SHUNT/IMG LEFT Left 05/03/2020  . IR US GUIDE VASC ACCESS LEFT  05/03/2020  . IR US GUIDE  VASC ACCESS RIGHT  04/22/2019  . IR US GUIDE VASC ACCESS RIGHT  05/01/2020  . POLYPECTOMY  09/30/2018   Procedure: POLYPECTOMY;  Surgeon: Danie Binder, MD;  [facility];  Service: Endoscopy;;  colon  . POLYPECTOMY  07/27/2020   Procedure: POLYPECTOMY;  Surgeon: Eloise Harman, DO;  [facility];  Service: Endoscopy;;  . RIGHT/LEFT HEART CATH AND CORONARY ANGIOGRAPHY  N/A 09/20/2020   Procedure: RIGHT/LEFT HEART CATH AND CORONARY ANGIOGRAPHY;  Surgeon: Martinique, Peter M, MD;  Location: [facility];  Service: Cardiovascular;  Laterality: N/A;  . TEE WITHOUT CARDIOVERSION N/A 09/23/2020   Procedure: TRANSESOPHAGEAL ECHOCARDIOGRAM (TEE);  Surgeon: Skeet Latch, MD;  Location: [facility];  Service: Cardiovascular;  Laterality: N/A;  . VIDEO BRONCHOSCOPY WITH ENDOBRONCHIAL NAVIGATION N/A 01/27/2019   Procedure: VIDEO BRONCHOSCOPY WITH ENDOBRONCHIAL NAVIGATION;  Surgeon: Grace Isaac, MD;  Location: [facility];  Service: Thoracic;  Laterality: N/A;  . VIDEO BRONCHOSCOPY WITH ENDOBRONCHIAL ULTRASOUND N/A 01/27/2019   Procedure: VIDEO BRONCHOSCOPY WITH ENDOBRONCHIAL ULTRASOUND;  Surgeon: Grace Isaac, MD;  Location: [facility];  Service: Thoracic;  Laterality: N/A;    Home Medications:  Current Facility-Administered Medications  Medication Dose Route Frequency Provider Last Rate Last Admin  . ceFAZolin (ANCEF) IVPB 2g/100 mL premix  2 g Intravenous 30 min Pre-Op Auna Mikkelsen, Candee Furbish, MD      . chlorhexidine (PERIDEX) 0.12 % solution 15 mL  15 mL Mouth/Throat Once Briant Cedar, Coralie Keens, MD       Or  . MEDLINE mouth rinse  15 mL Mouth Rinse Once Louann Sjogren, MD      . lactated ringers infusion   Intravenous Continuous Briant Cedar, Coralie Keens, MD       Allergies:  Allergies  Allergen Reactions  . Advair Hfa [Fluticasone-Salmeterol] Other (See Comments)    Developed thrush, although the mouth WAS being rinsed as directed  . Penicillins Rash    Has patient had a PCN reaction causing immediate rash, facial/tongue/throat swelling, SOB or lightheadedness with hypotension: No Has patient had a PCN reaction causing severe rash involving mucus membranes or skin necrosis: No Has patient had a PCN reaction that required hospitalization: No Has patient had a PCN reaction occurring within the last 10 years: No If all of the above answers are "NO", then may proceed with  Cephalosporin use.     Family History  Problem Relation Age of Onset  . Stroke Brother   . Lung cancer Sister 16       lung cancer/former  . Colon cancer Neg Hx   . Colon polyps Neg Hx    Social History:  reports that he quit smoking about 3 years ago. He has a 27.00 pack-year smoking history. He has never used smokeless tobacco. He reports previous alcohol use. He reports that he does not use drugs.  Review of Systems  Genitourinary: Positive for hematuria.  All other systems reviewed and are negative.   Physical Exam:  Vital signs in last 24 hours: Temp:  [97.8 F (36.6 C)] 97.8 F (36.6 C) (05/16 0702) Pulse Rate:  [72] 72 (05/16 0702) Resp:  [12] 12 (05/16 0702) BP: (146)/(78) 146/78 (05/16 0702) SpO2:  [98 %] 98 % (05/16 0702) Weight:  [72.6 kg] 72.6 kg (05/16 0702) Physical Exam Constitutional:      Appearance: Normal appearance.  HENT:     Head: Normocephalic and atraumatic.     Nose: Nose normal.     Mouth/Throat:  Mouth: Mucous membranes are dry.  Eyes:     Extraocular Movements: Extraocular movements intact.     Pupils: Pupils are equal, round, and reactive to light.  Cardiovascular:     Rate and Rhythm: Normal rate and regular rhythm.  Pulmonary:     Effort: Pulmonary effort is normal. No respiratory distress.  Abdominal:     General: Abdomen is flat. There is no distension.  Musculoskeletal:        General: No swelling. Normal range of motion.     Cervical back: Normal range of motion and neck supple.  Skin:    General: Skin is warm and dry.  Neurological:     General: No focal deficit present.     Mental Status: He is alert and oriented to person, place, and time.  Psychiatric:        Mood and Affect: Mood normal.        Behavior: Behavior normal.        Thought Content: Thought content normal.        Judgment: Judgment normal.     Laboratory Data:  Results for orders placed or performed during the hospital encounter of 03/28/21 (from the  past 24 hour(s))  I-STAT, chem 8     Status: Abnormal   Collection Time: 03/28/21  7:14 AM  Result Value Ref Range   Sodium 132 (L) 135 - 145 mmol/L   Potassium 6.8 (HH) 3.5 - 5.1 mmol/L   Chloride 97 (L) 98 - 111 mmol/L   BUN 55 (H) 8 - 23 mg/dL   Creatinine, Ser 10.40 (H) 0.61 - 1.24 mg/dL   Glucose, Bld 85 70 - 99 mg/dL   Calcium, Ion 0.94 (L) 1.15 - 1.40 mmol/L   TCO2 27 22 - 32 mmol/L   Hemoglobin 16.0 13.0 - 17.0 g/dL   HCT 47.0 39.0 - 52.0 %   Comment NOTIFIED PHYSICIAN   Basic metabolic panel     Status: Abnormal   Collection Time: 03/28/21  8:01 AM  Result Value Ref Range   Sodium 132 (L) 135 - 145 mmol/L   Potassium 4.9 3.5 - 5.1 mmol/L   Chloride 94 (L) 98 - 111 mmol/L   CO2 27 22 - 32 mmol/L   Glucose, Bld 89 70 - 99 mg/dL   BUN 36 (H) 8 - 23 mg/dL   Creatinine, Ser 10.39 (H) 0.61 - 1.24 mg/dL   Calcium 8.6 (L) 8.9 - 10.3 mg/dL   GFR, Estimated 5 (L) >60 mL/min   Anion gap 11 5 - 15   Recent Results (from the past 240 hour(s))  SARS CORONAVIRUS 2 (TAT 6-24 HRS) Nasopharyngeal Nasopharyngeal Swab     Status: None   Collection Time: 03/25/21  7:59 AM   Specimen: Nasopharyngeal Swab  Result Value Ref Range Status   SARS Coronavirus 2 NEGATIVE NEGATIVE Final    Comment: (NOTE) SARS-CoV-2 target nucleic acids are NOT DETECTED.  The SARS-CoV-2 RNA is generally detectable in upper and lower respiratory specimens during the acute phase of infection. Negative results do not preclude SARS-CoV-2 infection, do not rule out co-infections with other pathogens, and should not be used as the sole basis for treatment or other patient management decisions. Negative results must be combined with clinical observations, patient history, and epidemiological information. The expected result is Negative.  Fact Sheet for Patients: SugarRoll.be  Fact Sheet for Healthcare Providers: https://www.woods-mathews.com/  This test is not yet  approved or cleared by the [place] FDA and  has  been authorized for detection and/or diagnosis of SARS-CoV-2 by FDA under an Emergency Use Authorization (EUA). This EUA will remain  in effect (meaning this test can be used) for the duration of the COVID-19 declaration under Se ction 564(b)(1) of the Act, 21 U.S.C. section 360bbb-3(b)(1), unless the authorization is terminated or revoked sooner.  Performed at [facility], [place] [address], [place], [place] [address]    Creatinine: Recent Labs    03/28/21 4037 03/28/21 0801  CREATININE 10.40* 10.39*   Baseline Creatinine: NA  Impression/Assessment:  74yo with bladder tumor  Plan:  The risks/benefits/alternatives to bladder tumor resection was explained to the patient and he understands and wishes to proceed with surgery  Nicolette Bang 03/28/2021, 9:50 AM

## 2021-03-29 ENCOUNTER — Encounter (HOSPITAL_COMMUNITY): Payer: Self-pay | Admitting: Urology

## 2021-03-29 DIAGNOSIS — D689 Coagulation defect, unspecified: Secondary | ICD-10-CM | POA: Diagnosis not present

## 2021-03-29 DIAGNOSIS — N2581 Secondary hyperparathyroidism of renal origin: Secondary | ICD-10-CM | POA: Diagnosis not present

## 2021-03-29 DIAGNOSIS — Z992 Dependence on renal dialysis: Secondary | ICD-10-CM | POA: Diagnosis not present

## 2021-03-29 DIAGNOSIS — N186 End stage renal disease: Secondary | ICD-10-CM | POA: Diagnosis not present

## 2021-03-29 DIAGNOSIS — Z23 Encounter for immunization: Secondary | ICD-10-CM | POA: Diagnosis not present

## 2021-03-29 LAB — POCT I-STAT, CHEM 8
BUN: 55 mg/dL — ABNORMAL HIGH (ref 8–23)
Calcium, Ion: 0.94 mmol/L — ABNORMAL LOW (ref 1.15–1.40)
Chloride: 97 mmol/L — ABNORMAL LOW (ref 98–111)
Creatinine, Ser: 10.4 mg/dL — ABNORMAL HIGH (ref 0.61–1.24)
Glucose, Bld: 85 mg/dL (ref 70–99)
HCT: 47 % (ref 39.0–52.0)
Hemoglobin: 16 g/dL (ref 13.0–17.0)
Potassium: 6.8 mmol/L (ref 3.5–5.1)
Sodium: 132 mmol/L — ABNORMAL LOW (ref 135–145)
TCO2: 27 mmol/L (ref 22–32)

## 2021-03-30 LAB — SURGICAL PATHOLOGY

## 2021-03-31 DIAGNOSIS — D689 Coagulation defect, unspecified: Secondary | ICD-10-CM | POA: Diagnosis not present

## 2021-03-31 DIAGNOSIS — Z23 Encounter for immunization: Secondary | ICD-10-CM | POA: Diagnosis not present

## 2021-03-31 DIAGNOSIS — Z992 Dependence on renal dialysis: Secondary | ICD-10-CM | POA: Diagnosis not present

## 2021-03-31 DIAGNOSIS — N2581 Secondary hyperparathyroidism of renal origin: Secondary | ICD-10-CM | POA: Diagnosis not present

## 2021-03-31 DIAGNOSIS — N186 End stage renal disease: Secondary | ICD-10-CM | POA: Diagnosis not present

## 2021-04-01 ENCOUNTER — Other Ambulatory Visit: Payer: Self-pay

## 2021-04-01 ENCOUNTER — Ambulatory Visit (INDEPENDENT_AMBULATORY_CARE_PROVIDER_SITE_OTHER): Payer: Medicare Other | Admitting: Urology

## 2021-04-01 ENCOUNTER — Telehealth: Payer: Self-pay | Admitting: Urology

## 2021-04-01 DIAGNOSIS — C678 Malignant neoplasm of overlapping sites of bladder: Secondary | ICD-10-CM | POA: Diagnosis not present

## 2021-04-01 DIAGNOSIS — J449 Chronic obstructive pulmonary disease, unspecified: Secondary | ICD-10-CM | POA: Diagnosis not present

## 2021-04-01 NOTE — Telephone Encounter (Signed)
Wife notified that patient can start back on blood thinners Monday 04/04/21. Wife voiced understanding.

## 2021-04-01 NOTE — Progress Notes (Signed)
Fill and Pull Catheter Removal  Patient is present today for a catheter removal.   10cc NS removed from balloon without difficulty.  Performed by: Estill Bamberg RN  Follow up/ Additional notes: patient will f/u with [facility] for BCG.  Patient saw MD today.

## 2021-04-01 NOTE — Telephone Encounter (Signed)
Pt called wanting to know when he can go back on his blood thinners. Please call him when you can. Thanks

## 2021-04-02 DIAGNOSIS — Z23 Encounter for immunization: Secondary | ICD-10-CM | POA: Diagnosis not present

## 2021-04-02 DIAGNOSIS — N186 End stage renal disease: Secondary | ICD-10-CM | POA: Diagnosis not present

## 2021-04-02 DIAGNOSIS — N2581 Secondary hyperparathyroidism of renal origin: Secondary | ICD-10-CM | POA: Diagnosis not present

## 2021-04-02 DIAGNOSIS — Z992 Dependence on renal dialysis: Secondary | ICD-10-CM | POA: Diagnosis not present

## 2021-04-02 DIAGNOSIS — D689 Coagulation defect, unspecified: Secondary | ICD-10-CM | POA: Diagnosis not present

## 2021-04-05 DIAGNOSIS — N2581 Secondary hyperparathyroidism of renal origin: Secondary | ICD-10-CM | POA: Diagnosis not present

## 2021-04-05 DIAGNOSIS — N186 End stage renal disease: Secondary | ICD-10-CM | POA: Diagnosis not present

## 2021-04-05 DIAGNOSIS — Z992 Dependence on renal dialysis: Secondary | ICD-10-CM | POA: Diagnosis not present

## 2021-04-05 DIAGNOSIS — D689 Coagulation defect, unspecified: Secondary | ICD-10-CM | POA: Diagnosis not present

## 2021-04-07 DIAGNOSIS — N2581 Secondary hyperparathyroidism of renal origin: Secondary | ICD-10-CM | POA: Diagnosis not present

## 2021-04-07 DIAGNOSIS — Z992 Dependence on renal dialysis: Secondary | ICD-10-CM | POA: Diagnosis not present

## 2021-04-07 DIAGNOSIS — D689 Coagulation defect, unspecified: Secondary | ICD-10-CM | POA: Diagnosis not present

## 2021-04-07 DIAGNOSIS — N186 End stage renal disease: Secondary | ICD-10-CM | POA: Diagnosis not present

## 2021-04-09 DIAGNOSIS — Z992 Dependence on renal dialysis: Secondary | ICD-10-CM | POA: Diagnosis not present

## 2021-04-09 DIAGNOSIS — N2581 Secondary hyperparathyroidism of renal origin: Secondary | ICD-10-CM | POA: Diagnosis not present

## 2021-04-09 DIAGNOSIS — N186 End stage renal disease: Secondary | ICD-10-CM | POA: Diagnosis not present

## 2021-04-09 DIAGNOSIS — D689 Coagulation defect, unspecified: Secondary | ICD-10-CM | POA: Diagnosis not present

## 2021-04-12 ENCOUNTER — Encounter: Payer: Self-pay | Admitting: Urology

## 2021-04-12 DIAGNOSIS — I129 Hypertensive chronic kidney disease with stage 1 through stage 4 chronic kidney disease, or unspecified chronic kidney disease: Secondary | ICD-10-CM | POA: Diagnosis not present

## 2021-04-12 DIAGNOSIS — D689 Coagulation defect, unspecified: Secondary | ICD-10-CM | POA: Diagnosis not present

## 2021-04-12 DIAGNOSIS — Z992 Dependence on renal dialysis: Secondary | ICD-10-CM | POA: Diagnosis not present

## 2021-04-12 DIAGNOSIS — N2581 Secondary hyperparathyroidism of renal origin: Secondary | ICD-10-CM | POA: Diagnosis not present

## 2021-04-12 DIAGNOSIS — C678 Malignant neoplasm of overlapping sites of bladder: Secondary | ICD-10-CM | POA: Insufficient documentation

## 2021-04-12 DIAGNOSIS — N186 End stage renal disease: Secondary | ICD-10-CM | POA: Diagnosis not present

## 2021-04-12 NOTE — Progress Notes (Signed)
04/01/2021 11:12 AM   Jeremy Johnson 1946/10/01 [id number]  Referring provider: [name], MD [address] [place],  [place] [address]  followup bladder tumor resection  HPI: [name] is a 75yo here for followup after bladder tumor resection. Pathology T1G3. He has mild hematuria in the foley bag. No pelvic pain. No other complaints today   PMH: Past Medical History:  Diagnosis Date  . Anemia   . Blood transfusion without reported diagnosis   . CAD (coronary artery disease)    STENT... MID CIRCUMFLEX...1997  . Chronic kidney disease    STAGE 3  . COPD (chronic obstructive pulmonary disease) ([place])   . Degenerative joint disease (DJD) of lumbar spine   . GERD (gastroesophageal reflux disease)   . Gout   . Hyperlipidemia   . Hypertension   . Hypothyroidism   . Incisional hernia    abdomen  . Leukocytosis    CHRONIC MILD  . Myocardial infarction ([place])    1997  . SCL CA dx'd 01/2019   Lung cancer    Surgical History: Past Surgical History:  Procedure Laterality Date  . ABDOMINAL AORTIC ANEURYSM REPAIR  2006  . AV FISTULA PLACEMENT Left 04/25/2019   Procedure: ARTERIOVENOUS (AV) FISTULA CREATION LEFT ARM;  Surgeon: Angelia Mould, MD;  Location: [place];  Service: Vascular;  Laterality: Left;  . AV FISTULA PLACEMENT Left 05/19/2019   Procedure: CONVERSION OF LEFT ARM ARTERIOVENOUS FISTULA TO GRAFT;  Surgeon: Angelia Mould, MD;  Location: [place];  Service: Vascular;  Laterality: Left;  . BIOPSY  09/30/2018   Procedure: BIOPSY;  Surgeon: Danie Binder, MD;  [facility];  Service: Endoscopy;;  ascending colon  . BIOPSY  07/27/2020   Procedure: BIOPSY;  Surgeon: Eloise Harman, DO;  [facility];  Service: Endoscopy;;  gastric  . BUBBLE STUDY  09/23/2020   Procedure: BUBBLE STUDY;  Surgeon: Skeet Latch, MD;  Location: [place];  Service: Cardiovascular;;  . CARDIOVERSION N/A 09/23/2020   Procedure:  CARDIOVERSION;  Surgeon: Skeet Latch, MD;  Location: [place];  Service: Cardiovascular;  Laterality: N/A;  . COLONOSCOPY  2008  . COLONOSCOPY N/A 09/30/2018   External and internal hemorrhoids, six polyps removed, one ascending colon polypoid lesion biopsied. Six simple adenomas and one benign polypoid lesion. Colonoscopy Nov 2022.   [name] COLONOSCOPY WITH PROPOFOL N/A 07/27/2020   non-bleeding internal hemorrhoids, sigmoid and descending colon diverticulosis, four 1-2 mm polyps in ascending colon, one 5 mm polyp in transverse colon. 3 year surveillance. Tubular adenomas.   . CORONARY ANGIOPLASTY WITH STENT PLACEMENT  1997   MID CIRCUMFLEX  . CORONARY STENT INTERVENTION N/A 09/20/2020   Procedure: CORONARY STENT INTERVENTION;  Surgeon: Martinique, Peter M, MD;  Location: [facility];  Service: Cardiovascular;  Laterality: N/A;  . CYSTOSCOPY W/ RETROGRADES Bilateral 03/28/2021   Procedure: CYSTOSCOPY WITH RETROGRADE PYELOGRAM;  Surgeon: Cleon Gustin, MD;  [facility];  Service: Urology;  Laterality: Bilateral;  . ESOPHAGOGASTRODUODENOSCOPY (EGD) WITH PROPOFOL N/A 07/27/2020   Food in middle third of esophagus, gastritis s/p biopsy, nodular mucosa in lesser curvature of stomach s/p biopsy. Negative H.pylori.   [name] HIP ARTHROPLASTY Right 04/30/2020   Procedure: ARTHROPLASTY  HIP (HEMIARTHROPLASTY);  Surgeon: Altamese Cloquet, MD;  Location: [facility];  Service: Orthopedics;  Laterality: Right;  . INTRAVASCULAR ULTRASOUND/IVUS N/A 09/20/2020   Procedure: Intravascular Ultrasound/IVUS;  Surgeon: Martinique, Peter M, MD;  Location: [facility];  Service: Cardiovascular;  Laterality: N/A;  . IR FLUORO GUIDE CV LINE RIGHT  04/22/2019  . IR FLUORO GUIDE CV LINE RIGHT  05/01/2020  . IR THORACENTESIS ASP PLEURAL SPACE W/IMG GUIDE  09/22/2020  . IR THROMBECTOMY AV FISTULA W/THROMBOLYSIS/PTA INC/SHUNT/IMG LEFT Left 05/03/2020  . IR US GUIDE VASC ACCESS LEFT  05/03/2020  . IR US GUIDE VASC ACCESS  RIGHT  04/22/2019  . IR US GUIDE VASC ACCESS RIGHT  05/01/2020  . POLYPECTOMY  09/30/2018   Procedure: POLYPECTOMY;  Surgeon: Danie Binder, MD;  [facility];  Service: Endoscopy;;  colon  . POLYPECTOMY  07/27/2020   Procedure: POLYPECTOMY;  Surgeon: Eloise Harman, DO;  [facility];  Service: Endoscopy;;  . RIGHT/LEFT HEART CATH AND CORONARY ANGIOGRAPHY N/A 09/20/2020   Procedure: RIGHT/LEFT HEART CATH AND CORONARY ANGIOGRAPHY;  Surgeon: Martinique, Peter M, MD;  Location: [facility] [facility];  Service: Cardiovascular;  Laterality: N/A;  . TEE WITHOUT CARDIOVERSION N/A 09/23/2020   Procedure: TRANSESOPHAGEAL ECHOCARDIOGRAM (TEE);  Surgeon: Skeet Latch, MD;  Location: [place];  Service: Cardiovascular;  Laterality: N/A;  . TRANSURETHRAL RESECTION OF BLADDER TUMOR N/A 03/28/2021   Procedure: TRANSURETHRAL RESECTION OF BLADDER TUMOR (TURBT);  Surgeon: Cleon Gustin, MD;  [facility];  Service: Urology;  Laterality: N/A;  . VIDEO BRONCHOSCOPY WITH ENDOBRONCHIAL NAVIGATION N/A 01/27/2019   Procedure: VIDEO BRONCHOSCOPY WITH ENDOBRONCHIAL NAVIGATION;  Surgeon: Grace Isaac, MD;  Location: [facility];  Service: Thoracic;  Laterality: N/A;  . VIDEO BRONCHOSCOPY WITH ENDOBRONCHIAL ULTRASOUND N/A 01/27/2019   Procedure: VIDEO BRONCHOSCOPY WITH ENDOBRONCHIAL ULTRASOUND;  Surgeon: Grace Isaac, MD;  Location: [facility];  Service: Thoracic;  Laterality: N/A;    Home Medications:  Allergies as of 04/01/2021      Reactions   Advair Hfa [fluticasone-salmeterol] Other (See Comments)   Developed thrush, although the mouth WAS being rinsed as directed   Penicillins Rash   Has patient had a PCN reaction causing immediate rash, facial/tongue/throat swelling, SOB or lightheadedness with hypotension: No Has patient had a PCN reaction causing severe rash involving mucus membranes or skin necrosis: No Has patient had a PCN reaction that required hospitalization: No Has  patient had a PCN reaction occurring within the last 10 years: No If all of the above answers are "NO", then may proceed with Cephalosporin use.      Medication List       Accurate as of Apr 01, 2021 11:59 PM. If you have any questions, ask your nurse or doctor.        acetaminophen 325 MG tablet Commonly known as: TYLENOL Take 2 tablets (650 mg total) by mouth every 6 (six) hours as needed for mild pain (or Fever >/= 101). What changed:   how much to take  reasons to take this  additional instructions   allopurinol 100 MG tablet Commonly known as: ZYLOPRIM Take 1 tablet (100 mg total) by mouth daily. What changed: when to take this   amiodarone 200 MG tablet Commonly known as: Pacerone Take 1 tablet (200 mg total) by mouth daily.   atorvastatin 40 MG tablet Commonly known as: LIPITOR TAKE 1 TABLET BY MOUTH  DAILY   clopidogrel 75 MG tablet Commonly known as: PLAVIX TAKE 1 TABLET BY MOUTH  DAILY WITH BREAKFAST   Dialyvite 800-Zinc 15 0.8 MG Tabs Take 1 tablet by mouth daily.   Eliquis 2.5 MG Tabs tablet Generic drug: apixaban TAKE 1 TABLET BY MOUTH  TWICE DAILY What changed: how much  to take   HYDROcodone-acetaminophen 5-325 MG tablet Commonly known as: NORCO/VICODIN Take 1 tablet by mouth every 4 (four) hours as needed for moderate pain.   ipratropium 0.06 % nasal spray Commonly known as: ATROVENT Place 2 sprays into both nostrils daily as needed for rhinitis.   levocetirizine 5 MG tablet Commonly known as: XYZAL Take 5 mg by mouth at bedtime.   levothyroxine 175 MCG tablet Commonly known as: SYNTHROID Take 175 mcg by mouth daily before breakfast.   lidocaine-prilocaine cream Commonly known as: EMLA Apply 1 application topically Every Tuesday,Thursday,and Saturday with dialysis.   metoprolol succinate 25 MG 24 hr tablet Commonly known as: TOPROL-XL Take 0.5 tablets (12.5 mg total) by mouth daily.   omeprazole 40 MG capsule Commonly known as:  PRILOSEC Take 1 capsule (40 mg total) by mouth in the morning and at bedtime. What changed: when to take this   sevelamer carbonate 800 MG tablet Commonly known as: RENVELA Take 800-1,600 mg by mouth 3 (three) times daily with meals.       Allergies:  Allergies  Allergen Reactions  . Advair Hfa [Fluticasone-Salmeterol] Other (See Comments)    Developed thrush, although the mouth WAS being rinsed as directed  . Penicillins Rash    Has patient had a PCN reaction causing immediate rash, facial/tongue/throat swelling, SOB or lightheadedness with hypotension: No Has patient had a PCN reaction causing severe rash involving mucus membranes or skin necrosis: No Has patient had a PCN reaction that required hospitalization: No Has patient had a PCN reaction occurring within the last 10 years: No If all of the above answers are "NO", then may proceed with Cephalosporin use.     Family History: Family History  Problem Relation Age of Onset  . Stroke Brother   . Lung cancer Sister 64       lung cancer/former  . Colon cancer Neg Hx   . Colon polyps Neg Hx     Social History:  reports that he quit smoking about 3 years ago. He has a 27.00 pack-year smoking history. He has never used smokeless tobacco. He reports previous alcohol use. He reports that he does not use drugs.  ROS: All other review of systems were reviewed and are negative except what is noted above in HPI  Physical Exam: There were no vitals taken for this visit.  Constitutional:  Alert and oriented, No acute distress. HEENT: [place] AT, moist mucus membranes.  Trachea midline, no masses. Cardiovascular: No clubbing, cyanosis, or edema. Respiratory: Normal respiratory effort, no increased work of breathing. GI: Abdomen is soft, nontender, nondistended, no abdominal masses GU: No CVA tenderness.  Lymph: No cervical or inguinal lymphadenopathy. Skin: No rashes, bruises or suspicious lesions. Neurologic: Grossly intact, no  focal deficits, moving all 4 extremities. Psychiatric: Normal mood and affect.  Laboratory Data: Lab Results  Component Value Date   WBC 7.7 01/17/2021   HGB 16.0 03/28/2021   HCT 47.0 03/28/2021   MCV 101.9 (H) 01/17/2021   PLT 164 01/17/2021    Lab Results  Component Value Date   CREATININE 10.39 (H) 03/28/2021    No results found for: PSA  No results found for: TESTOSTERONE  Lab Results  Component Value Date   HGBA1C 6.5 (H) 04/25/2019    Urinalysis    Component Value Date/Time   COLORURINE YELLOW 05/22/2019 1919   APPEARANCEUR CLEAR 05/22/2019 1919   LABSPEC 1.022 05/22/2019 1919   PHURINE 8.0 05/22/2019 1919   GLUCOSEU 150 (A) 05/22/2019 1919  HGBUR MODERATE (A) 05/22/2019 [place] NEGATIVE 05/22/2019 [place] NEGATIVE 05/22/2019 1919   PROTEINUR >=300 (A) 05/22/2019 1919   NITRITE NEGATIVE 05/22/2019 1919   LEUKOCYTESUR NEGATIVE 05/22/2019 1919    Lab Results  Component Value Date   BACTERIA RARE (A) 05/22/2019    Pertinent Imaging:  No results found for this or any previous visit.  No results found for this or any previous visit.  No results found for this or any previous visit.  No results found for this or any previous visit.  Results for orders placed during the hospital encounter of 04/14/19  US RENAL  Narrative CLINICAL DATA:  Acute renal disease.  Chronic renal disease.  EXAM: RENAL / URINARY TRACT ULTRASOUND COMPLETE  COMPARISON:  PET-CT 01/22/2019.  FINDINGS: Right Kidney:  Renal measurements: 12.8 x 6.0 x 6.1 cm = volume: 242.3 mL. Increased echogenicity. 8.8 cm simple cyst. 3.6 cm simple cyst. No hydronephrosis visualized.  Left Kidney:  Renal measurements: 12.9 x 6.1 x 5.3 cm = volume: 219.5 mL. Increased echogenicity. No mass or hydronephrosis visualized.  Bladder:  Appears normal for degree of bladder distention.  IMPRESSION: Increased echogenicity both kidneys. This consistent chronic  medical renal disease. Two simple cyst right kidney. No evidence of hydronephrosis or bladder distention. No acute abnormality identified.   Electronically Signed By: Marcello Moores  Register On: 04/15/2019 11:33  No results found for this or any previous visit.  No results found for this or any previous visit.  No results found for this or any previous visit.   Assessment & Plan:    1. Malignant neoplasm of overlapping sites of bladder ([place]) -We discussed the management of high grade bladder cancer includuing surveillance and intravesical therapy including intravesical BCG. After discussing the management options the patient elects for intravesical BCG. We will start 6 weeks of therapy in 1 month   No follow-ups on file.  Nicolette Bang, MD  [facility] [place]

## 2021-04-12 NOTE — Patient Instructions (Signed)
Bladder Cancer  Bladder cancer is a condition in which abnormal tissue (a tumor) grows in the bladder. The bladder is the organ that holds urine. Two tubes (ureters) carry the urine from the kidneys to the bladder. The bladder wall is made of layers of tissue. Cancer that spreads through these layers of the bladder wall becomes more difficult to treat. What are the causes? The cause of this condition is not known. What increases the risk? The following factors may make you more likely to develop this condition:  Smoking.  Working where there are risks (occupational exposures), such as working with Engineer, structural, Brewing technologist, clothing fabric, dyes, chemicals, and paint.  Being 64 years of age or older.  Being male.  Having bladder inflammation that is long-term (chronic).  Having a history of cancer, including: ? A family history of bladder cancer. ? Personal experience with bladder cancer. ? Having had certain treatments for cancer before. These include:  Medicines to kill cancer cells (chemotherapy).  Strong X-ray beams or capsules high in energy to kill cancer cells and shrink tumors (radiation therapy).  Having been exposed to arsenic. This is a Financial risk analyst that can poison you. What are the signs or symptoms? Early symptoms of this condition include:  Seeing blood in your urine.  Feeling pain when urinating.  Having infections of your urinary system (urinary tract infections or UTIs) that happen often.  Having to urinate sooner or more often than usual. Later symptoms of this condition include:  Not being able to urinate.  Pain on one side of your lower back.  Loss of appetite.  Weight loss.  Tiredness (fatigue).  Swelling in your feet.  Bone pain. How is this diagnosed? This condition is diagnosed based on:  Your medical history.  A physical exam.  Lab tests, such as urine tests.  Imaging tests.  Your symptoms. You may also have other tests or  procedures done, such as:  A cystoscopy. A narrow tube is inserted into your bladder through the organ that connects your bladder to the outside of your body (urethra). This is done to view the lining of your bladder for tumors.  A biopsy. This procedure involves removing a tissue sample to look at it under a microscope to see if cancer is present. It is important to find out:  How deeply into the bladder wall cancer has grown.  Whether cancer has spread to any other parts of your body. This may require blood tests or imaging tests, such as a CT scan, MRI, bone scan, or X-rays. How is this treated? Your health care provider may recommend one or more types of treatment based on the stage of your cancer. The most common types of treatment are:  Surgery to remove the cancer. Procedures that may be done include: ? Removing a tumor on the inside wall of the bladder (transurethral resection). ? Removing the bladder (cystectomy).  Radiation therapy. This is often used together with chemotherapy.  Chemotherapy.  Immunotherapy. This uses medicines to help your immune system destroy cancer cells. Follow these instructions at home:  Take over-the-counter and prescription medicines only as told by your health care provider.  Eat a healthy diet. Some of your treatments might affect your appetite.  Do not use any products that contain nicotine or tobacco, such as cigarettes, e-cigarettes, and chewing tobacco. If you need help quitting, ask your health care provider.  Consider joining a support group. This may help you learn to cope with the stress of having  bladder cancer.  Tell your cancer care team if you develop side effects. Your team may be able to recommend ways to get relief.  Keep all follow-up visits as told by your health care provider. This is important. Where to find more information  American Cancer Society: www.cancer.[place] ([place]):  www.cancer.gov Contact a health care provider if:  You have symptoms of a urinary tract infection. These include: ? Fever. ? Chills. ? Weakness. ? Muscle aches. ? Pain in your abdomen. ? Urge to urinate that is stronger and happens more often than usual. ? Burning feeling in the bladder or urethra when you urinate. Get help right away if:  There is blood in your urine.  You cannot urinate.  You have severe pain or other symptoms that do not go away. Summary  Bladder cancer is a condition in which tumors grow in the bladder and cause illness.  This condition is diagnosed based on your medical history, a physical exam, lab tests, imaging tests, and your symptoms.  Your health care provider may recommend one or more types of treatment based on the stage of your cancer.  Consider joining a support group. This may help you learn to cope with the stress of having bladder cancer. This information is not intended to replace advice given to you by your health care provider. Make sure you discuss any questions you have with your health care provider. Document Revised: 07/09/2019 Document Reviewed: 07/09/2019 Elsevier Patient Education  [place].

## 2021-04-13 ENCOUNTER — Ambulatory Visit: Payer: Medicare Other | Admitting: Urology

## 2021-04-14 DIAGNOSIS — N186 End stage renal disease: Secondary | ICD-10-CM | POA: Diagnosis not present

## 2021-04-14 DIAGNOSIS — Z992 Dependence on renal dialysis: Secondary | ICD-10-CM | POA: Diagnosis not present

## 2021-04-14 DIAGNOSIS — N2581 Secondary hyperparathyroidism of renal origin: Secondary | ICD-10-CM | POA: Diagnosis not present

## 2021-04-14 DIAGNOSIS — D689 Coagulation defect, unspecified: Secondary | ICD-10-CM | POA: Diagnosis not present

## 2021-04-16 DIAGNOSIS — Z992 Dependence on renal dialysis: Secondary | ICD-10-CM | POA: Diagnosis not present

## 2021-04-16 DIAGNOSIS — N2581 Secondary hyperparathyroidism of renal origin: Secondary | ICD-10-CM | POA: Diagnosis not present

## 2021-04-16 DIAGNOSIS — D689 Coagulation defect, unspecified: Secondary | ICD-10-CM | POA: Diagnosis not present

## 2021-04-16 DIAGNOSIS — N186 End stage renal disease: Secondary | ICD-10-CM | POA: Diagnosis not present

## 2021-04-19 DIAGNOSIS — N2581 Secondary hyperparathyroidism of renal origin: Secondary | ICD-10-CM | POA: Diagnosis not present

## 2021-04-19 DIAGNOSIS — N186 End stage renal disease: Secondary | ICD-10-CM | POA: Diagnosis not present

## 2021-04-19 DIAGNOSIS — D689 Coagulation defect, unspecified: Secondary | ICD-10-CM | POA: Diagnosis not present

## 2021-04-19 DIAGNOSIS — Z992 Dependence on renal dialysis: Secondary | ICD-10-CM | POA: Diagnosis not present

## 2021-04-21 DIAGNOSIS — Z992 Dependence on renal dialysis: Secondary | ICD-10-CM | POA: Diagnosis not present

## 2021-04-21 DIAGNOSIS — D689 Coagulation defect, unspecified: Secondary | ICD-10-CM | POA: Diagnosis not present

## 2021-04-21 DIAGNOSIS — N2581 Secondary hyperparathyroidism of renal origin: Secondary | ICD-10-CM | POA: Diagnosis not present

## 2021-04-21 DIAGNOSIS — N186 End stage renal disease: Secondary | ICD-10-CM | POA: Diagnosis not present

## 2021-04-23 DIAGNOSIS — N2581 Secondary hyperparathyroidism of renal origin: Secondary | ICD-10-CM | POA: Diagnosis not present

## 2021-04-23 DIAGNOSIS — D689 Coagulation defect, unspecified: Secondary | ICD-10-CM | POA: Diagnosis not present

## 2021-04-23 DIAGNOSIS — N186 End stage renal disease: Secondary | ICD-10-CM | POA: Diagnosis not present

## 2021-04-23 DIAGNOSIS — Z992 Dependence on renal dialysis: Secondary | ICD-10-CM | POA: Diagnosis not present

## 2021-04-24 IMAGING — CT CT CHEST WITHOUT CONTRAST
2 of 4 series · 15 of 36 positions shown, 18 images · non-contrast
Comparison: PET-CT dated 01/22/2019

CLINICAL DATA: Limited stage small cell lung cancer

EXAM:
CT CHEST WITHOUT CONTRAST
TECHNIQUE: Multidetector CT imaging of the chest was performed following the
standard protocol without IV contrast.

[Series 2: thorax · axial · 0.76mm/px · z∈[-336,-24]mm · 12 of 183 slices shown, 15 images]
[im 14/183  mediastinal]
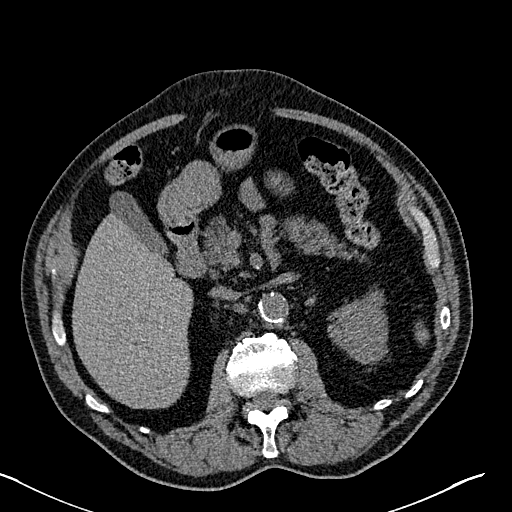
[im 14/183  lung]
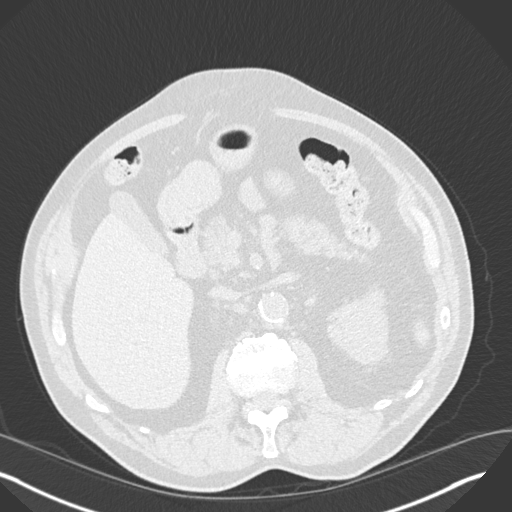
[im 27/183  lung]
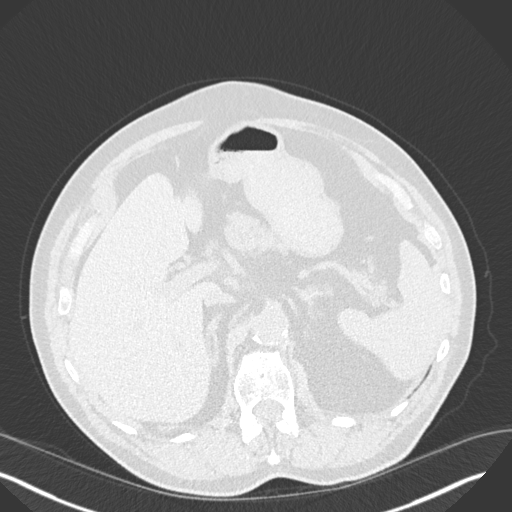
[im 40/183  lung]
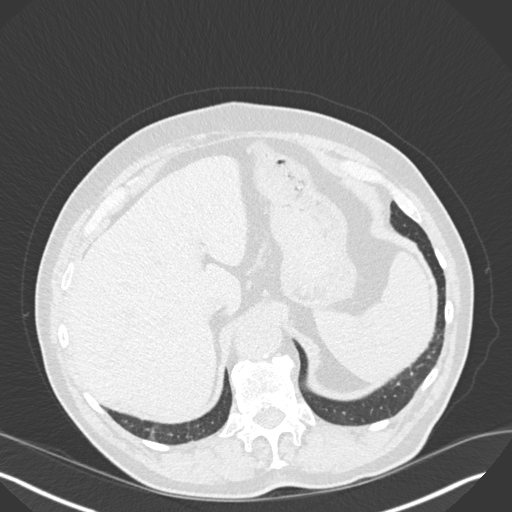
[im 53/183  lung]
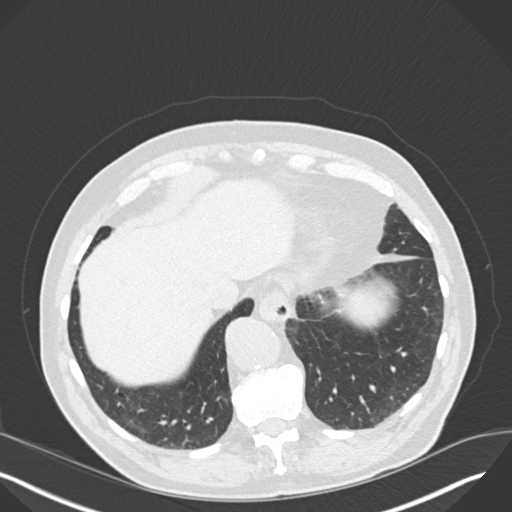
[im 66/183  mediastinal]
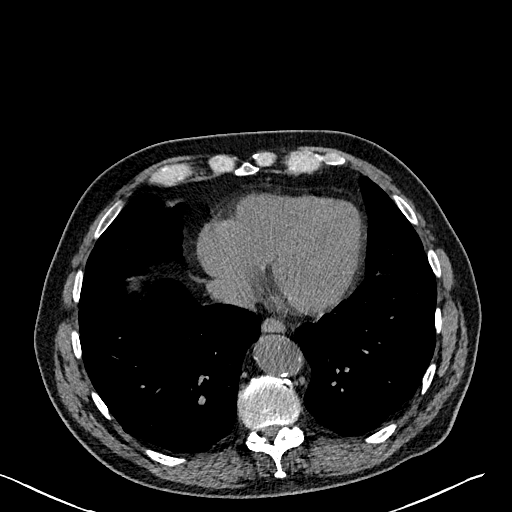
[im 66/183  lung]
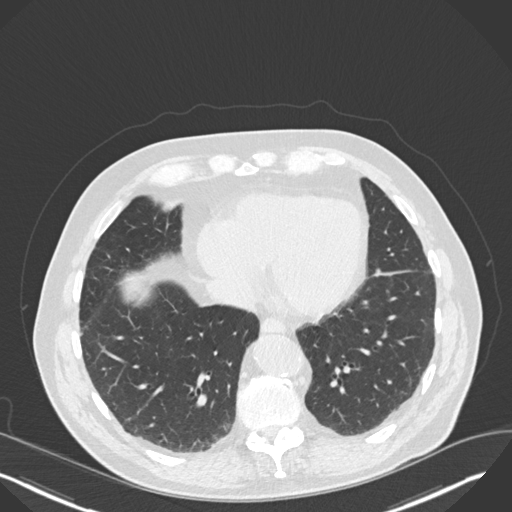
[im 79/183  lung]
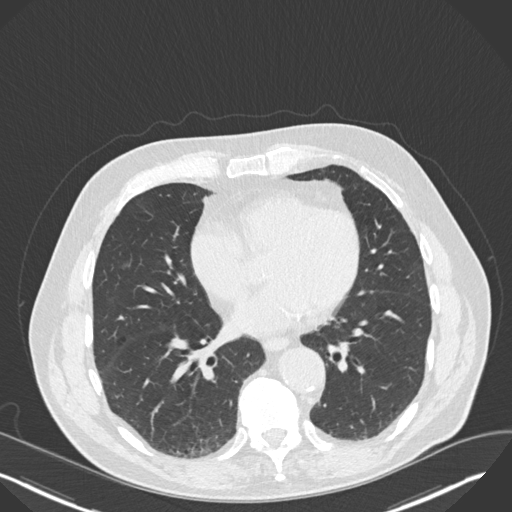
[im 105/183  lung]
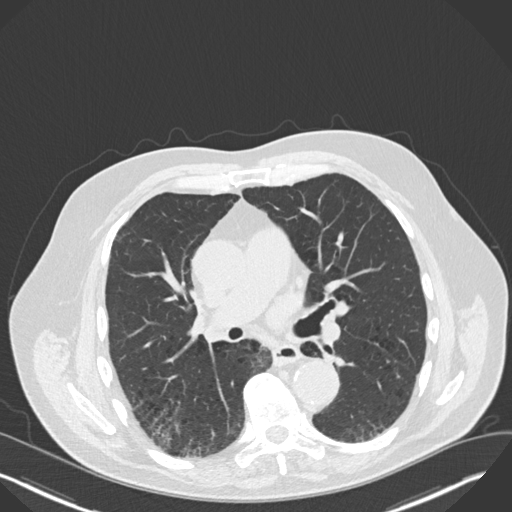
[im 118/183  lung]
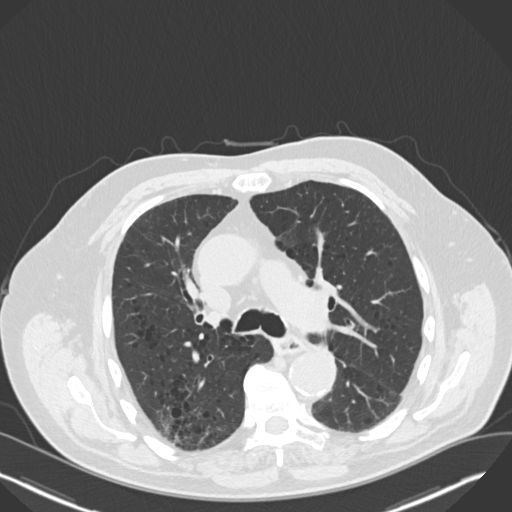
[im 131/183  mediastinal]
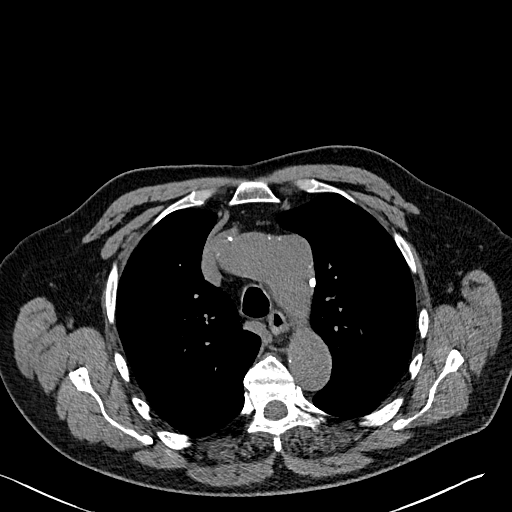
[im 131/183  lung]
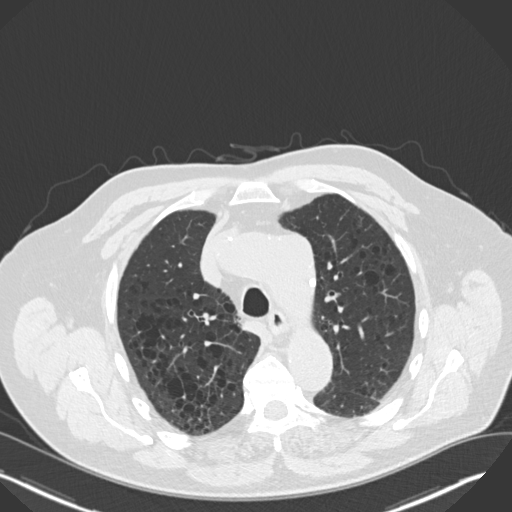
[im 144/183  lung]
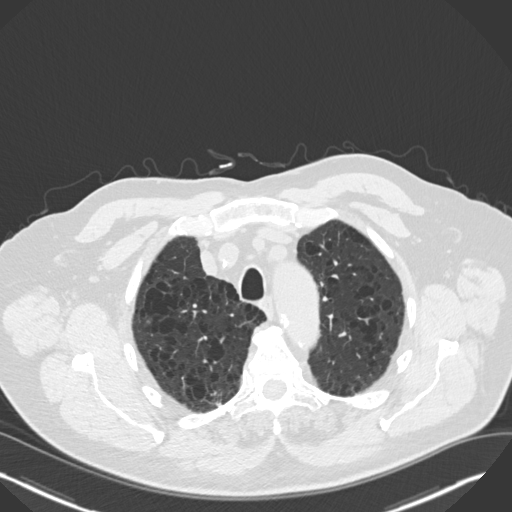
[im 157/183  lung]
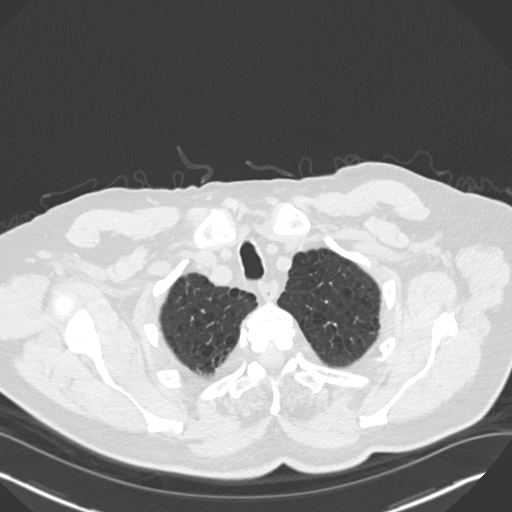
[im 170/183  lung]
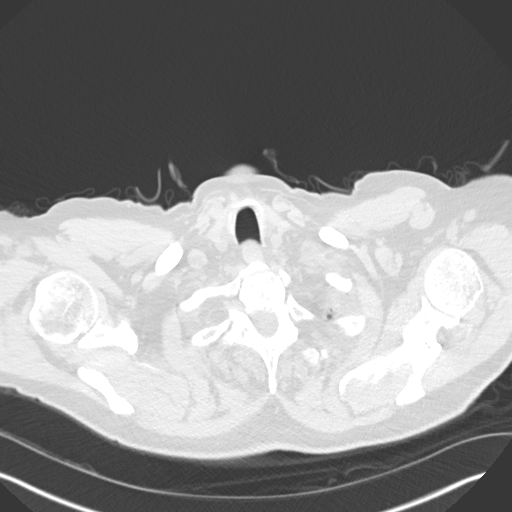

[Series 5: coronal · coronal · 0.81mm/px · 3 of 152 slices shown]
[im 31/152  lung]
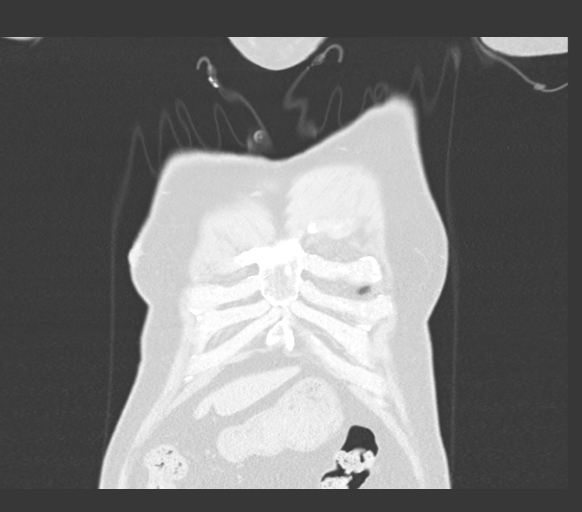
[im 61/152  lung]
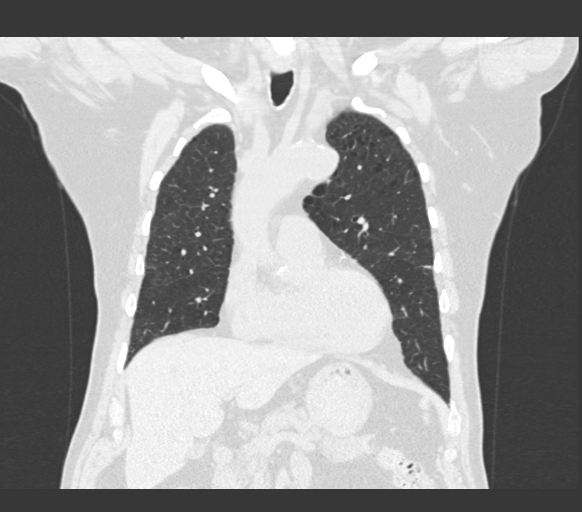
[im 91/152  lung]
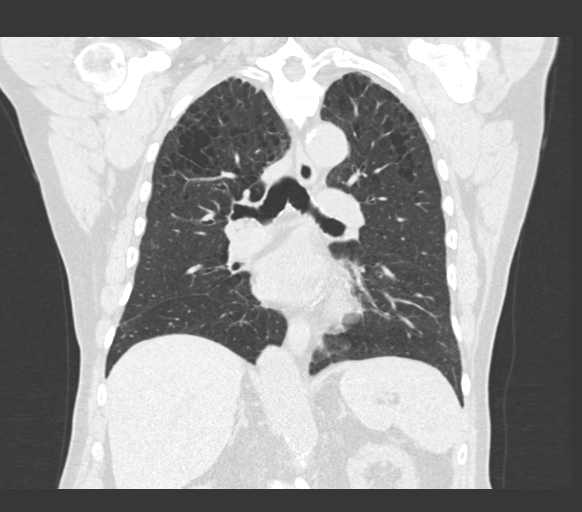

[15 of 36 positions shown; findings below may reference images not displayed]

FINDINGS: Cardiovascular: Heart is top-normal in size. No pericardial
effusion.

Stable focal aneurysmal dilatation anteriorly along the anterior
aspect of the transverse aortic arch (series 2/image 50), measuring
up to 4.7 cm, grossly unchanged. Atherosclerotic calcifications of
the abdominal aorta and branch vessels.

Coronary atherosclerosis of the LAD and left circumflex.

Mediastinum/Nodes: No suspicious mediastinal lymphadenopathy.

Lungs/Pleura: Prior perifissural nodule/mass anterior to the
descending thoracic aorta has essentially resolved (series 7/image
58). Minimal residual scarring/radiation changes.

Otherwise, no suspicious pulmonary nodules.

No focal consolidation.

Moderate centrilobular and paraseptal emphysematous changes, upper
lobe predominant.

No pleural effusion or pneumothorax.

Upper Abdomen: Visualized upper abdomen is grossly unremarkable,
noting vascular calcifications.

Musculoskeletal: Degenerative changes of the visualized
thoracolumbar spine.
IMPRESSION: Prior perifissural nodule/mass anterior to the descending thoracic
aorta has essentially resolved. Mild residual scarring/radiation
changes.

No evidence of recurrent/metastatic disease.

Aortic Atherosclerosis (XBHE4-Y5Q.Q) and Emphysema (XBHE4-DTW.L).

## 2021-04-26 ENCOUNTER — Telehealth: Payer: Self-pay | Admitting: Cardiology

## 2021-04-26 DIAGNOSIS — N186 End stage renal disease: Secondary | ICD-10-CM | POA: Diagnosis not present

## 2021-04-26 DIAGNOSIS — Z992 Dependence on renal dialysis: Secondary | ICD-10-CM | POA: Diagnosis not present

## 2021-04-26 DIAGNOSIS — D689 Coagulation defect, unspecified: Secondary | ICD-10-CM | POA: Diagnosis not present

## 2021-04-26 DIAGNOSIS — N2581 Secondary hyperparathyroidism of renal origin: Secondary | ICD-10-CM | POA: Diagnosis not present

## 2021-04-26 MED ORDER — AMIODARONE HCL 200 MG PO TABS: 200.0000 mg | ORAL_TABLET | Freq: Every day | ORAL | 3 refills | Status: DC

## 2021-04-26 NOTE — Telephone Encounter (Signed)
Pt notified refill to local pharmacy

## 2021-04-26 NOTE — Telephone Encounter (Signed)
*  STAT* If patient is at the pharmacy, call can be transferred to refill team.   1. Which medications need to be refilled? (please list name of each medication and dose if known) Amiodarone generic for Pacerone 200mg   2. Which pharmacy/location (including street and city if local pharmacy) is medication to be sent to? [facility] on [address]  3. Do they need a 30 day or 90 day supply? needs 10 days (enough until he gets the mail order)   Patient called said he normally gets his meds by [facility](mail order) but it wouuld be next week before he can get in mail. He wants to know if Dr Harl Bowie can send Rx for Amiodarone generic for Pacerone 200mg  takes Daily can he wait week or more before he gets thought was on auto refill.  Can Rx be sent to [facility] on [address]. He can be reached at [phone] 30days

## 2021-04-28 DIAGNOSIS — Z992 Dependence on renal dialysis: Secondary | ICD-10-CM | POA: Diagnosis not present

## 2021-04-28 DIAGNOSIS — D689 Coagulation defect, unspecified: Secondary | ICD-10-CM | POA: Diagnosis not present

## 2021-04-28 DIAGNOSIS — N2581 Secondary hyperparathyroidism of renal origin: Secondary | ICD-10-CM | POA: Diagnosis not present

## 2021-04-28 DIAGNOSIS — N186 End stage renal disease: Secondary | ICD-10-CM | POA: Diagnosis not present

## 2021-04-30 DIAGNOSIS — D689 Coagulation defect, unspecified: Secondary | ICD-10-CM | POA: Diagnosis not present

## 2021-04-30 DIAGNOSIS — Z992 Dependence on renal dialysis: Secondary | ICD-10-CM | POA: Diagnosis not present

## 2021-04-30 DIAGNOSIS — N2581 Secondary hyperparathyroidism of renal origin: Secondary | ICD-10-CM | POA: Diagnosis not present

## 2021-04-30 DIAGNOSIS — N186 End stage renal disease: Secondary | ICD-10-CM | POA: Diagnosis not present

## 2021-05-02 ENCOUNTER — Ambulatory Visit (INDEPENDENT_AMBULATORY_CARE_PROVIDER_SITE_OTHER): Payer: Medicare Other

## 2021-05-02 ENCOUNTER — Other Ambulatory Visit: Payer: Self-pay

## 2021-05-02 DIAGNOSIS — C678 Malignant neoplasm of overlapping sites of bladder: Secondary | ICD-10-CM

## 2021-05-02 DIAGNOSIS — J449 Chronic obstructive pulmonary disease, unspecified: Secondary | ICD-10-CM | POA: Diagnosis not present

## 2021-05-02 MED ORDER — BCG LIVE 50 MG IS SUSR
3.2400 mL | Freq: Once | INTRAVESICAL | Status: AC
  Administered 2021-05-02: 40.5 mg via INTRAVESICAL

## 2021-05-02 NOTE — Progress Notes (Addendum)
BCG Bladder Instillation  BCG # 1  Due to Bladder Cancer patient is present today for a BCG treatment. Patient was cleaned and prepped in a sterile fashion with betadine. A 14FR catheter was inserted, urine return was noted ml, urine was dark yellow scant amount in color.  55ml of reconstituted BCG was instilled into the bladder. The catheter was then removed. Patient tolerated well, no complications were noted  Preformed by: Estill Bamberg RN  Follow up/ Additional notes: 1 week NV  Patient will return in 2 hours to have BCG drained from bladder- pt currently dialysis and does not make urine per pt. Pt was given a urine specimen cup to attempt to empty bladder in 2 hours at home. Patient understands 50cc of solution should be voided out. If patient can't void patient will return for I/O to drain.

## 2021-05-02 NOTE — Patient Instructions (Signed)

## 2021-05-03 DIAGNOSIS — Z992 Dependence on renal dialysis: Secondary | ICD-10-CM | POA: Diagnosis not present

## 2021-05-03 DIAGNOSIS — N2581 Secondary hyperparathyroidism of renal origin: Secondary | ICD-10-CM | POA: Diagnosis not present

## 2021-05-03 DIAGNOSIS — N186 End stage renal disease: Secondary | ICD-10-CM | POA: Diagnosis not present

## 2021-05-03 DIAGNOSIS — D689 Coagulation defect, unspecified: Secondary | ICD-10-CM | POA: Diagnosis not present

## 2021-05-05 DIAGNOSIS — Z992 Dependence on renal dialysis: Secondary | ICD-10-CM | POA: Diagnosis not present

## 2021-05-05 DIAGNOSIS — N2581 Secondary hyperparathyroidism of renal origin: Secondary | ICD-10-CM | POA: Diagnosis not present

## 2021-05-05 DIAGNOSIS — N186 End stage renal disease: Secondary | ICD-10-CM | POA: Diagnosis not present

## 2021-05-05 DIAGNOSIS — D689 Coagulation defect, unspecified: Secondary | ICD-10-CM | POA: Diagnosis not present

## 2021-05-07 DIAGNOSIS — N186 End stage renal disease: Secondary | ICD-10-CM | POA: Diagnosis not present

## 2021-05-07 DIAGNOSIS — N2581 Secondary hyperparathyroidism of renal origin: Secondary | ICD-10-CM | POA: Diagnosis not present

## 2021-05-07 DIAGNOSIS — D689 Coagulation defect, unspecified: Secondary | ICD-10-CM | POA: Diagnosis not present

## 2021-05-07 DIAGNOSIS — Z992 Dependence on renal dialysis: Secondary | ICD-10-CM | POA: Diagnosis not present

## 2021-05-09 ENCOUNTER — Ambulatory Visit: Payer: Medicare Other

## 2021-05-10 ENCOUNTER — Other Ambulatory Visit: Payer: Self-pay

## 2021-05-10 ENCOUNTER — Ambulatory Visit (INDEPENDENT_AMBULATORY_CARE_PROVIDER_SITE_OTHER): Payer: Medicare Other

## 2021-05-10 DIAGNOSIS — N186 End stage renal disease: Secondary | ICD-10-CM | POA: Diagnosis not present

## 2021-05-10 DIAGNOSIS — C678 Malignant neoplasm of overlapping sites of bladder: Secondary | ICD-10-CM | POA: Diagnosis not present

## 2021-05-10 DIAGNOSIS — Z992 Dependence on renal dialysis: Secondary | ICD-10-CM | POA: Diagnosis not present

## 2021-05-10 DIAGNOSIS — N2581 Secondary hyperparathyroidism of renal origin: Secondary | ICD-10-CM | POA: Diagnosis not present

## 2021-05-10 DIAGNOSIS — D689 Coagulation defect, unspecified: Secondary | ICD-10-CM | POA: Diagnosis not present

## 2021-05-10 MED ORDER — BCG LIVE 50 MG IS SUSR
3.2400 mL | Freq: Once | INTRAVESICAL | Status: AC
  Administered 2021-05-10: 40.5 mg via INTRAVESICAL

## 2021-05-10 NOTE — Progress Notes (Signed)
BCG Bladder Instillation  BCG # 2 of 6  Due to Bladder Cancer patient is present today for a BCG treatment. Patient was cleaned and prepped in a sterile fashion with betadine. A 16FR catheter was inserted, urine return was noted 34ml, urine was yellow in color.  25ml of reconstituted BCG was instilled into the bladder. The catheter was then capped. Patient tolerated well, no complications were noted  Performed by: Brendalyn Vallely LPN  Follow up/ Additional notes: Patient will return in two hours to have bladder drained

## 2021-05-10 NOTE — Patient Instructions (Signed)

## 2021-05-12 ENCOUNTER — Telehealth: Payer: Self-pay | Admitting: Internal Medicine

## 2021-05-12 DIAGNOSIS — D689 Coagulation defect, unspecified: Secondary | ICD-10-CM | POA: Diagnosis not present

## 2021-05-12 DIAGNOSIS — N186 End stage renal disease: Secondary | ICD-10-CM | POA: Diagnosis not present

## 2021-05-12 DIAGNOSIS — N2581 Secondary hyperparathyroidism of renal origin: Secondary | ICD-10-CM | POA: Diagnosis not present

## 2021-05-12 DIAGNOSIS — I129 Hypertensive chronic kidney disease with stage 1 through stage 4 chronic kidney disease, or unspecified chronic kidney disease: Secondary | ICD-10-CM | POA: Diagnosis not present

## 2021-05-12 DIAGNOSIS — Z992 Dependence on renal dialysis: Secondary | ICD-10-CM | POA: Diagnosis not present

## 2021-05-12 NOTE — Telephone Encounter (Signed)
Scheduled appt per 6/30 sch msg. Pt aware.

## 2021-05-14 DIAGNOSIS — D689 Coagulation defect, unspecified: Secondary | ICD-10-CM | POA: Diagnosis not present

## 2021-05-14 DIAGNOSIS — Z992 Dependence on renal dialysis: Secondary | ICD-10-CM | POA: Diagnosis not present

## 2021-05-14 DIAGNOSIS — N186 End stage renal disease: Secondary | ICD-10-CM | POA: Diagnosis not present

## 2021-05-14 DIAGNOSIS — E039 Hypothyroidism, unspecified: Secondary | ICD-10-CM | POA: Diagnosis not present

## 2021-05-14 DIAGNOSIS — R52 Pain, unspecified: Secondary | ICD-10-CM | POA: Diagnosis not present

## 2021-05-14 DIAGNOSIS — N2581 Secondary hyperparathyroidism of renal origin: Secondary | ICD-10-CM | POA: Diagnosis not present

## 2021-05-17 ENCOUNTER — Ambulatory Visit: Payer: Medicare Other

## 2021-05-17 DIAGNOSIS — R52 Pain, unspecified: Secondary | ICD-10-CM | POA: Diagnosis not present

## 2021-05-17 DIAGNOSIS — E039 Hypothyroidism, unspecified: Secondary | ICD-10-CM | POA: Diagnosis not present

## 2021-05-17 DIAGNOSIS — N186 End stage renal disease: Secondary | ICD-10-CM | POA: Diagnosis not present

## 2021-05-17 DIAGNOSIS — N2581 Secondary hyperparathyroidism of renal origin: Secondary | ICD-10-CM | POA: Diagnosis not present

## 2021-05-17 DIAGNOSIS — Z992 Dependence on renal dialysis: Secondary | ICD-10-CM | POA: Diagnosis not present

## 2021-05-17 DIAGNOSIS — D689 Coagulation defect, unspecified: Secondary | ICD-10-CM | POA: Diagnosis not present

## 2021-05-18 ENCOUNTER — Ambulatory Visit: Payer: Medicare Other

## 2021-05-18 DIAGNOSIS — C679 Malignant neoplasm of bladder, unspecified: Secondary | ICD-10-CM | POA: Diagnosis not present

## 2021-05-18 DIAGNOSIS — Z9181 History of falling: Secondary | ICD-10-CM | POA: Diagnosis not present

## 2021-05-18 DIAGNOSIS — I480 Paroxysmal atrial fibrillation: Secondary | ICD-10-CM | POA: Diagnosis not present

## 2021-05-19 ENCOUNTER — Ambulatory Visit: Payer: Medicare Other

## 2021-05-19 DIAGNOSIS — N186 End stage renal disease: Secondary | ICD-10-CM | POA: Diagnosis not present

## 2021-05-19 DIAGNOSIS — E039 Hypothyroidism, unspecified: Secondary | ICD-10-CM | POA: Diagnosis not present

## 2021-05-19 DIAGNOSIS — D689 Coagulation defect, unspecified: Secondary | ICD-10-CM | POA: Diagnosis not present

## 2021-05-19 DIAGNOSIS — R52 Pain, unspecified: Secondary | ICD-10-CM | POA: Diagnosis not present

## 2021-05-19 DIAGNOSIS — N2581 Secondary hyperparathyroidism of renal origin: Secondary | ICD-10-CM | POA: Diagnosis not present

## 2021-05-19 DIAGNOSIS — Z992 Dependence on renal dialysis: Secondary | ICD-10-CM | POA: Diagnosis not present

## 2021-05-21 DIAGNOSIS — N2581 Secondary hyperparathyroidism of renal origin: Secondary | ICD-10-CM | POA: Diagnosis not present

## 2021-05-21 DIAGNOSIS — N186 End stage renal disease: Secondary | ICD-10-CM | POA: Diagnosis not present

## 2021-05-21 DIAGNOSIS — E039 Hypothyroidism, unspecified: Secondary | ICD-10-CM | POA: Diagnosis not present

## 2021-05-21 DIAGNOSIS — Z992 Dependence on renal dialysis: Secondary | ICD-10-CM | POA: Diagnosis not present

## 2021-05-21 DIAGNOSIS — D689 Coagulation defect, unspecified: Secondary | ICD-10-CM | POA: Diagnosis not present

## 2021-05-21 DIAGNOSIS — R52 Pain, unspecified: Secondary | ICD-10-CM | POA: Diagnosis not present

## 2021-05-23 ENCOUNTER — Ambulatory Visit: Payer: Medicare Other

## 2021-05-24 ENCOUNTER — Emergency Department (HOSPITAL_COMMUNITY)
Admission: EM | Admit: 2021-05-24 | Discharge: 2021-05-25 | Disposition: A | Discharge status: Home / Self Care | Payer: Medicare Other | Attending: Emergency Medicine | Admitting: Emergency Medicine

## 2021-05-24 ENCOUNTER — Emergency Department (HOSPITAL_COMMUNITY): Payer: Medicare Other

## 2021-05-24 ENCOUNTER — Other Ambulatory Visit: Payer: Self-pay

## 2021-05-24 ENCOUNTER — Ambulatory Visit (INDEPENDENT_AMBULATORY_CARE_PROVIDER_SITE_OTHER): Payer: Medicare Other

## 2021-05-24 ENCOUNTER — Encounter (HOSPITAL_COMMUNITY): Payer: Self-pay | Admitting: Emergency Medicine

## 2021-05-24 DIAGNOSIS — Z8551 Personal history of malignant neoplasm of bladder: Secondary | ICD-10-CM | POA: Insufficient documentation

## 2021-05-24 DIAGNOSIS — Z7901 Long term (current) use of anticoagulants: Secondary | ICD-10-CM | POA: Diagnosis not present

## 2021-05-24 DIAGNOSIS — Z992 Dependence on renal dialysis: Secondary | ICD-10-CM | POA: Diagnosis not present

## 2021-05-24 DIAGNOSIS — Z87891 Personal history of nicotine dependence: Secondary | ICD-10-CM | POA: Insufficient documentation

## 2021-05-24 DIAGNOSIS — C678 Malignant neoplasm of overlapping sites of bladder: Secondary | ICD-10-CM

## 2021-05-24 DIAGNOSIS — K469 Unspecified abdominal hernia without obstruction or gangrene: Secondary | ICD-10-CM | POA: Insufficient documentation

## 2021-05-24 DIAGNOSIS — I132 Hypertensive heart and chronic kidney disease with heart failure and with stage 5 chronic kidney disease, or end stage renal disease: Secondary | ICD-10-CM | POA: Diagnosis not present

## 2021-05-24 DIAGNOSIS — D689 Coagulation defect, unspecified: Secondary | ICD-10-CM | POA: Diagnosis not present

## 2021-05-24 DIAGNOSIS — I5021 Acute systolic (congestive) heart failure: Secondary | ICD-10-CM | POA: Insufficient documentation

## 2021-05-24 DIAGNOSIS — I2510 Atherosclerotic heart disease of native coronary artery without angina pectoris: Secondary | ICD-10-CM | POA: Diagnosis not present

## 2021-05-24 DIAGNOSIS — Z79899 Other long term (current) drug therapy: Secondary | ICD-10-CM | POA: Diagnosis not present

## 2021-05-24 DIAGNOSIS — Z20822 Contact with and (suspected) exposure to covid-19: Secondary | ICD-10-CM | POA: Insufficient documentation

## 2021-05-24 DIAGNOSIS — Z85118 Personal history of other malignant neoplasm of bronchus and lung: Secondary | ICD-10-CM | POA: Insufficient documentation

## 2021-05-24 DIAGNOSIS — R410 Disorientation, unspecified: Secondary | ICD-10-CM | POA: Insufficient documentation

## 2021-05-24 DIAGNOSIS — N390 Urinary tract infection, site not specified: Secondary | ICD-10-CM | POA: Insufficient documentation

## 2021-05-24 DIAGNOSIS — R404 Transient alteration of awareness: Secondary | ICD-10-CM | POA: Diagnosis not present

## 2021-05-24 DIAGNOSIS — E039 Hypothyroidism, unspecified: Secondary | ICD-10-CM | POA: Insufficient documentation

## 2021-05-24 DIAGNOSIS — J449 Chronic obstructive pulmonary disease, unspecified: Secondary | ICD-10-CM | POA: Diagnosis not present

## 2021-05-24 DIAGNOSIS — R52 Pain, unspecified: Secondary | ICD-10-CM | POA: Diagnosis not present

## 2021-05-24 DIAGNOSIS — Z743 Need for continuous supervision: Secondary | ICD-10-CM | POA: Diagnosis not present

## 2021-05-24 DIAGNOSIS — I10 Essential (primary) hypertension: Secondary | ICD-10-CM | POA: Diagnosis not present

## 2021-05-24 DIAGNOSIS — N2581 Secondary hyperparathyroidism of renal origin: Secondary | ICD-10-CM | POA: Diagnosis not present

## 2021-05-24 DIAGNOSIS — N186 End stage renal disease: Secondary | ICD-10-CM | POA: Diagnosis not present

## 2021-05-24 DIAGNOSIS — R509 Fever, unspecified: Secondary | ICD-10-CM | POA: Diagnosis not present

## 2021-05-24 LAB — COMPREHENSIVE METABOLIC PANEL
ALT: 38 U/L (ref 0–44)
AST: 49 U/L — ABNORMAL HIGH (ref 15–41)
Albumin: 3.5 g/dL (ref 3.5–5.0)
Alkaline Phosphatase: 94 U/L (ref 38–126)
Anion gap: 13 (ref 5–15)
BUN: 30 mg/dL — ABNORMAL HIGH (ref 8–23)
CO2: 27 mmol/L (ref 22–32)
Calcium: 9 mg/dL (ref 8.9–10.3)
Chloride: 91 mmol/L — ABNORMAL LOW (ref 98–111)
Creatinine, Ser: 8.4 mg/dL — ABNORMAL HIGH (ref 0.61–1.24)
GFR, Estimated: 6 mL/min — ABNORMAL LOW (ref >60)
Glucose, Bld: 141 mg/dL — ABNORMAL HIGH (ref 70–99)
Potassium: 5 mmol/L (ref 3.5–5.1)
Sodium: 131 mmol/L — ABNORMAL LOW (ref 135–145)
Total Bilirubin: 0.8 mg/dL (ref 0.3–1.2)
Total Protein: 7.6 g/dL (ref 6.5–8.1)

## 2021-05-24 LAB — CBC WITH DIFFERENTIAL/PLATELET
Abs Immature Granulocytes: 0.18 10*3/uL — ABNORMAL HIGH (ref 0.00–0.07)
Basophils Absolute: 0.1 10*3/uL (ref 0.0–0.1)
Basophils Relative: 1 %
Eosinophils Absolute: 0 10*3/uL (ref 0.0–0.5)
Eosinophils Relative: 0 %
HCT: 51.7 % (ref 39.0–52.0)
Hemoglobin: 17 g/dL (ref 13.0–17.0)
Immature Granulocytes: 1 %
Lymphocytes Relative: 2 %
Lymphs Abs: 0.3 10*3/uL — ABNORMAL LOW (ref 0.7–4.0)
MCH: 33.8 pg (ref 26.0–34.0)
MCHC: 32.9 g/dL (ref 30.0–36.0)
MCV: 102.8 fL — ABNORMAL HIGH (ref 80.0–100.0)
Monocytes Absolute: 1.5 10*3/uL — ABNORMAL HIGH (ref 0.1–1.0)
Monocytes Relative: 9 %
Neutro Abs: 13.9 10*3/uL — ABNORMAL HIGH (ref 1.7–7.7)
Neutrophils Relative %: 87 %
Platelets: 154 10*3/uL (ref 150–400)
RBC: 5.03 MIL/uL (ref 4.22–5.81)
RDW: 18.3 % — ABNORMAL HIGH (ref 11.5–15.5)
WBC: 16 10*3/uL — ABNORMAL HIGH (ref 4.0–10.5)
nRBC: 0 % (ref 0.0–0.2)

## 2021-05-24 LAB — LACTIC ACID, PLASMA: Lactic Acid, Venous: 1.6 mmol/L (ref 0.5–1.9)

## 2021-05-24 MED ORDER — BCG LIVE 50 MG IS SUSR
3.2400 mL | Freq: Once | INTRAVESICAL | Status: AC
  Administered 2021-05-24: 81 mg via INTRAVESICAL

## 2021-05-24 MED ORDER — ACETAMINOPHEN 325 MG PO TABS
650.0000 mg | ORAL_TABLET | Freq: Once | ORAL | Status: AC
  Administered 2021-05-24: 650 mg via ORAL
  Filled 2021-05-24: qty 2

## 2021-05-24 NOTE — Patient Instructions (Signed)

## 2021-05-24 NOTE — ED Provider Notes (Signed)
[facility] EMERGENCY DEPARTMENT Provider Note   CSN: [id number] Arrival date & time: 05/24/21  2133     History Chief Complaint  Patient presents with   Fever    Jeremy Johnson is a 75 y.o. male.  HPI     This is a 75 year old male with a history of coronary artery disease, end-stage renal disease on dialysis, COPD, hypertension who presents with confusion and fever.  Per nursing report, patient presents with fever and confusion.  He had dialysis today.  Patient is unable to tell me why he is here.  He has no physical complaints at this time.  He is technically alert and oriented x3 but when asked questions gives inappropriate answers.  Level 5 caveat for confusion  Chart reviewed.  Patient had BCG treatment earlier today for bladder cancer.  Past Medical History:  Diagnosis Date   Anemia    Blood transfusion without reported diagnosis    CAD (coronary artery disease)    STENT... MID CIRCUMFLEX...1997   Chronic kidney disease    STAGE 3   COPD (chronic obstructive pulmonary disease) (HCC)    Degenerative joint disease (DJD) of lumbar spine    GERD (gastroesophageal reflux disease)    Gout    Hyperlipidemia    Hypertension    Hypothyroidism    Incisional hernia    abdomen   Leukocytosis    CHRONIC MILD   Myocardial infarction ([place])    1997   SCL CA dx'd 01/2019   Lung cancer    Patient Active Problem List   Diagnosis Date Noted   Malignant neoplasm of overlapping sites of bladder ([place]) 04/12/2021   Bladder tumor 12/07/2020   Gross hematuria 12/07/2020   Elevated LFTs 09/29/2020   Dysphagia 09/29/2020   A-fib ([place]) 09/22/2020   Palliative care by specialist    DNR (do not resuscitate) discussion    Acute systolic CHF (congestive heart failure) ([place])    Restless leg syndrome 09/17/2020   Elevated troponin I level 09/17/2020   Loss of weight 06/09/2020   Early satiety 06/09/2020   Closed displaced fracture of right femoral neck with delayed healing  04/29/2020   Closed displaced fracture of right femoral neck ([place]) 04/29/2020   Acute respiratory failure with hypoxia ([place]) 04/06/2020   Acute and chronic respiratory failure with hypoxia ([place]) 04/05/2020   Chronic obstructive pulmonary disease/emphysema    Anaphylactic reaction due to adverse effect of correct drug or medicament properly administered, initial encounter 07/30/2019   Hypocalcemia 05/31/2019   Other disorders of phosphorus metabolism 05/31/2019   Pancytopenia ([place]) 05/22/2019   ESRD (end stage renal disease) on dialysis ([place]) 05/22/2019   Unspecified protein-calorie malnutrition ([place]) 05/02/2019   Coagulation defect, unspecified ([place]) 04/26/2019   Diarrhea, unspecified 04/26/2019   Hypokalemia 04/26/2019   Pain, unspecified 04/26/2019   Pruritus, unspecified 04/26/2019   Secondary hyperparathyroidism of renal origin ([place]) 04/26/2019   Encounter for immunization 04/26/2019   Incisional hernia without obstruction or gangrene 04/25/2019   Malignant neoplasm of unspecified part of unspecified bronchus or lung ([place]) 04/25/2019   Other specified degenerative diseases of nervous system ([place]) 04/25/2019   Anemia in chronic kidney disease 04/25/2019   Atherosclerotic heart disease of native coronary artery without angina pectoris 04/25/2019   Gastro-esophageal reflux disease without esophagitis 04/25/2019   Hypothyroidism, unspecified 04/25/2019   Acute renal failure superimposed on stage 3 chronic kidney disease ([place]) 04/16/2019   Anemia in chronic kidney disease (CKD) 04/14/2019   Small cell lung cancer ([place])  02/06/2019   Encounter for antineoplastic chemotherapy 02/06/2019   Goals of care, counseling/discussion 02/06/2019   Special screening for malignant neoplasms, colon    GERD (gastroesophageal reflux disease)    Hyperlipidemia    Gout    Hypothyroidism    Degenerative joint disease (DJD) of lumbar spine    Acquired hypothyroidism 02/02/2010   HLD (hyperlipidemia)  02/02/2010   Essential hypertension 02/02/2010   GERD 02/02/2010   ABDOMINAL AORTIC ANEURYSM REPAIR, HX OF 02/02/2010    Past Surgical History:  Procedure Laterality Date   ABDOMINAL AORTIC ANEURYSM REPAIR  2006   AV FISTULA PLACEMENT Left 04/25/2019   Procedure: ARTERIOVENOUS (AV) FISTULA CREATION LEFT ARM;  Surgeon: Angelia Mould, MD;  Location: [place];  Service: Vascular;  Laterality: Left;   AV FISTULA PLACEMENT Left 05/19/2019   Procedure: CONVERSION OF LEFT ARM ARTERIOVENOUS FISTULA TO GRAFT;  Surgeon: Angelia Mould, MD;  Location: [place];  Service: Vascular;  Laterality: Left;   BIOPSY  09/30/2018   Procedure: BIOPSY;  Surgeon: Danie Binder, MD;  [facility];  Service: Endoscopy;;  ascending colon   BIOPSY  07/27/2020   Procedure: BIOPSY;  Surgeon: Eloise Harman, DO;  [facility];  Service: Endoscopy;;  gastric   BUBBLE STUDY  09/23/2020   Procedure: BUBBLE STUDY;  Surgeon: Skeet Latch, MD;  Location: [place];  Service: Cardiovascular;;   CARDIOVERSION N/A 09/23/2020   Procedure: CARDIOVERSION;  Surgeon: Skeet Latch, MD;  Location: [place];  Service: Cardiovascular;  Laterality: N/A;   COLONOSCOPY  2008   COLONOSCOPY N/A 09/30/2018   External and internal hemorrhoids, six polyps removed, one ascending colon polypoid lesion biopsied. Six simple adenomas and one benign polypoid lesion. Colonoscopy Nov 2022.    COLONOSCOPY WITH PROPOFOL N/A 07/27/2020   non-bleeding internal hemorrhoids, sigmoid and descending colon diverticulosis, four 1-2 mm polyps in ascending colon, one 5 mm polyp in transverse colon. 3 year surveillance. Tubular adenomas.    CORONARY ANGIOPLASTY WITH STENT PLACEMENT  1997   MID CIRCUMFLEX   CORONARY STENT INTERVENTION N/A 09/20/2020   Procedure: CORONARY STENT INTERVENTION;  Surgeon: Martinique, Peter M, MD;  Location: [place] [facility];  Service: Cardiovascular;  Laterality: N/A;   CYSTOSCOPY W/  RETROGRADES Bilateral 03/28/2021   Procedure: CYSTOSCOPY WITH RETROGRADE PYELOGRAM;  Surgeon: Cleon Gustin, MD;  Location: [facility];  Service: Urology;  Laterality: Bilateral;   ESOPHAGOGASTRODUODENOSCOPY (EGD) WITH PROPOFOL N/A 07/27/2020   Food in middle third of esophagus, gastritis s/p biopsy, nodular mucosa in lesser curvature of stomach s/p biopsy. Negative H.pylori.    HIP ARTHROPLASTY Right 04/30/2020   Procedure: ARTHROPLASTY  HIP (HEMIARTHROPLASTY);  Surgeon: Altamese Nevada, MD;  Location: [place];  Service: Orthopedics;  Laterality: Right;   INTRAVASCULAR ULTRASOUND/IVUS N/A 09/20/2020   Procedure: Intravascular Ultrasound/IVUS;  Surgeon: Martinique, Peter M, MD;  Location: [place] [facility];  Service: Cardiovascular;  Laterality: N/A;   IR FLUORO GUIDE CV LINE RIGHT  04/22/2019   IR FLUORO GUIDE CV LINE RIGHT  05/01/2020   IR THORACENTESIS ASP PLEURAL SPACE W/IMG GUIDE  09/22/2020   IR THROMBECTOMY AV FISTULA W/THROMBOLYSIS/PTA INC/SHUNT/IMG LEFT Left 05/03/2020   IR US GUIDE VASC ACCESS LEFT  05/03/2020   IR US GUIDE VASC ACCESS RIGHT  04/22/2019   IR US GUIDE VASC ACCESS RIGHT  05/01/2020   POLYPECTOMY  09/30/2018   Procedure: POLYPECTOMY;  Surgeon: Danie Binder, MD;  [facility];  Service: Endoscopy;;  colon  POLYPECTOMY  07/27/2020   Procedure: POLYPECTOMY;  Surgeon: Eloise Harman, DO;  [facility];  Service: Endoscopy;;   RIGHT/LEFT HEART CATH AND CORONARY ANGIOGRAPHY N/A 09/20/2020   Procedure: RIGHT/LEFT HEART CATH AND CORONARY ANGIOGRAPHY;  Surgeon: Martinique, Peter M, MD;  Location: [place] [facility];  Service: Cardiovascular;  Laterality: N/A;   TEE WITHOUT CARDIOVERSION N/A 09/23/2020   Procedure: TRANSESOPHAGEAL ECHOCARDIOGRAM (TEE);  Surgeon: Skeet Latch, MD;  Location: [place];  Service: Cardiovascular;  Laterality: N/A;   TRANSURETHRAL RESECTION OF BLADDER TUMOR N/A 03/28/2021   Procedure: TRANSURETHRAL RESECTION OF BLADDER TUMOR (TURBT);   Surgeon: Cleon Gustin, MD;  [facility]: [facility];  Service: Urology;  Laterality: N/A;   VIDEO BRONCHOSCOPY WITH ENDOBRONCHIAL NAVIGATION N/A 01/27/2019   Procedure: VIDEO BRONCHOSCOPY WITH ENDOBRONCHIAL NAVIGATION;  Surgeon: Grace Isaac, MD;  Location: [place];  Service: Thoracic;  Laterality: N/A;   VIDEO BRONCHOSCOPY WITH ENDOBRONCHIAL ULTRASOUND N/A 01/27/2019   Procedure: VIDEO BRONCHOSCOPY WITH ENDOBRONCHIAL ULTRASOUND;  Surgeon: Grace Isaac, MD;  [facility]: [facility];  Service: Thoracic;  Laterality: N/A;       Family History  Problem Relation Age of Onset   Stroke Brother    Lung cancer Sister 9       lung cancer/former   Colon cancer Neg Hx    Colon polyps Neg Hx     Social History   Tobacco Use   Smoking status: Former    Packs/day: 0.50    Years: 54.00    Pack years: 27.00    Types: Cigarettes    Quit date: 09/17/2017    Years since quitting: 3.6   Smokeless tobacco: Never  Vaping Use   Vaping Use: Never used  Substance Use Topics   Alcohol use: Not Currently    Comment: occasional   Drug use: No    Home Medications Prior to Admission medications   Medication Sig Start Date End Date Taking? Authorizing Provider  cephALEXin (KEFLEX) 500 MG capsule Take 1 capsule (500 mg total) by mouth 3 (three) times daily. 05/25/21  Yes Rasha Ibe, Barbette Hair, MD  acetaminophen (TYLENOL) 325 MG tablet Take 2 tablets (650 mg total) by mouth every 6 (six) hours as needed for mild pain (or Fever >/= 101). Patient taking differently: Take 1,300 mg by mouth every 6 (six) hours as needed for mild pain or headache (or Fever >/= 101). Arthritis strength 05/25/19   Swayze, Ava, DO  allopurinol (ZYLOPRIM) 100 MG tablet Take 1 tablet (100 mg total) by mouth daily. Patient taking differently: Take 100 mg by mouth in the morning. 04/26/19   Mercy Riding, MD  amiodarone (PACERONE) 200 MG tablet Take 1 tablet (200 mg total) by mouth daily. 04/26/21   Arnoldo Lenis, MD  atorvastatin  (LIPITOR) 40 MG tablet TAKE 1 TABLET BY MOUTH  DAILY Patient taking differently: Take 40 mg by mouth daily. 12/02/20   Arnoldo Lenis, MD  B Complex-C-Zn-Folic Acid (DIALYVITE 092-ZRAQ 15) 0.8 MG TABS Take 1 tablet by mouth daily. 04/22/20   [provider]  clopidogrel (PLAVIX) 75 MG tablet TAKE 1 TABLET BY MOUTH  DAILY WITH BREAKFAST Patient taking differently: Take 75 mg by mouth daily with breakfast. 12/02/20   Branch, Alphonse Guild, MD  ELIQUIS 2.5 MG TABS tablet TAKE 1 TABLET BY MOUTH  TWICE DAILY Patient taking differently: Take 2.5 mg by mouth 2 (two) times daily. 12/06/20   Arnoldo Lenis, MD  HYDROcodone-acetaminophen (NORCO/VICODIN) 5-325 MG tablet Take 1  tablet by mouth every 4 (four) hours as needed for moderate pain. 03/28/21 03/28/22  Cleon Gustin, MD  ipratropium (ATROVENT) 0.06 % nasal spray Place 2 sprays into both nostrils daily as needed for rhinitis.    [provider]  levocetirizine (XYZAL) 5 MG tablet Take 5 mg by mouth at bedtime. 11/22/20   [provider]  levothyroxine (SYNTHROID, LEVOTHROID) 175 MCG tablet Take 175 mcg by mouth daily before breakfast.     [provider]  lidocaine-prilocaine (EMLA) cream Apply 1 application topically Every Tuesday,Thursday,and Saturday with dialysis. 08/28/20   [provider]  metoprolol succinate (TOPROL-XL) 25 MG 24 hr tablet Take 0.5 tablets (12.5 mg total) by mouth daily. 02/16/21   Arnoldo Lenis, MD  omeprazole (PRILOSEC) 40 MG capsule Take 1 capsule (40 mg total) by mouth in the morning and at bedtime. Patient taking differently: Take 40 mg by mouth daily. 07/27/20 09/29/20  Eloise Harman, DO  sevelamer carbonate (RENVELA) 800 MG tablet Take 800-1,600 mg by mouth 3 (three) times daily with meals.    [provider]    Allergies    Advair hfa [fluticasone-salmeterol] and Penicillins  Review of Systems   Review of Systems  Constitutional:  Positive for fever.   Respiratory:  Negative for shortness of breath.   Cardiovascular:  Negative for chest pain.  Gastrointestinal:  Negative for abdominal pain, nausea and vomiting.  Genitourinary:  Negative for dysuria.  Psychiatric/Behavioral:  Positive for confusion.   All other systems reviewed and are negative.  Physical Exam Updated Vital Signs BP 105/68   Pulse 86   Temp (!) 100.6 F (38.1 C)   Resp (!) 27   Ht 1.753 m (5\' 9" )   Wt 73 kg   SpO2 95%   BMI 23.77 kg/m   Physical Exam Vitals and nursing note reviewed.  Constitutional:      Appearance: He is well-developed. He is not ill-appearing.  HENT:     Head: Normocephalic and atraumatic.     Nose: Nose normal.     Mouth/Throat:     Mouth: Mucous membranes are moist.  Eyes:     Pupils: Pupils are equal, round, and reactive to light.  Cardiovascular:     Rate and Rhythm: Normal rate and regular rhythm.     Heart sounds: Normal heart sounds. No murmur heard. Pulmonary:     Effort: Pulmonary effort is normal. No respiratory distress.     Breath sounds: Normal breath sounds. No wheezing.  Abdominal:     General: Bowel sounds are normal.     Palpations: Abdomen is soft.     Tenderness: There is no abdominal tenderness. There is no rebound.     Hernia: A hernia is present.     Comments: Reducible ventral hernia  Musculoskeletal:     Cervical back: Neck supple.     Right lower leg: No edema.     Left lower leg: No edema.  Skin:    General: Skin is warm and dry.  Neurological:     Mental Status: He is alert and oriented to person, place, and time.     Comments: Oriented x3 but gives inappropriate answers to questions, 5 out of 5 strength in all 4 extremities, no facial droop, cranial nerves II through XII intact  Psychiatric:        Mood and Affect: Mood normal.    ED Results / Procedures / Treatments   Labs (all labs ordered are listed, but only  abnormal results are displayed) Labs Reviewed  COMPREHENSIVE METABOLIC PANEL -  Abnormal; Notable for the following components:      Result Value   Sodium 131 (*)    Chloride 91 (*)    Glucose, Bld 141 (*)    BUN 30 (*)    Creatinine, Ser 8.40 (*)    AST 49 (*)    GFR, Estimated 6 (*)    All other components within normal limits  CBC WITH DIFFERENTIAL/PLATELET - Abnormal; Notable for the following components:   WBC 16.0 (*)    MCV 102.8 (*)    RDW 18.3 (*)    Neutro Abs 13.9 (*)    Lymphs Abs 0.3 (*)    Monocytes Absolute 1.5 (*)    Abs Immature Granulocytes 0.18 (*)    All other components within normal limits  URINALYSIS, ROUTINE W REFLEX MICROSCOPIC - Abnormal; Notable for the following components:   Color, Urine BROWN (*)    APPearance TURBID (*)    Glucose, UA 100 (*)    Hgb urine dipstick LARGE (*)    Bilirubin Urine MODERATE (*)    Protein, ur >300 (*)    Nitrite POSITIVE (*)    Leukocytes,Ua LARGE (*)    All other components within normal limits  URINALYSIS, MICROSCOPIC (REFLEX) - Abnormal; Notable for the following components:   Bacteria, UA MANY (*)    All other components within normal limits  RESP PANEL BY RT-PCR (FLU A&B, COVID) [id number]  URINE CULTURE  LACTIC ACID, PLASMA  LACTIC ACID, PLASMA    EKG EKG Interpretation  Date/Time:  Tuesday May 24 2021 21:48:31 EDT Ventricular Rate:  86 PR Interval:  182 QRS Duration: 124 QT Interval:  373 QTC Calculation: 447 R Axis:   -42 Text Interpretation: Sinus rhythm Nonspecific IVCD with LAD LVH with secondary repolarization abnormality Baseline wander in lead(s) II III aVF V3 Confirmed by Thayer Jew [phone]) on 05/24/2021 10:57:05 PM  Radiology DG Chest 2 View  Result Date: 05/24/2021 CLINICAL DATA:  Fevers EXAM: CHEST - 2 VIEW COMPARISON:  09/24/2020 CT FINDINGS: Cardiac shadow is within normal limits. Tortuous thoracic aorta with calcifications is noted. Left upper lobe scarring is seen similar to that noted on prior CT. Right lung is clear. No acute infiltrate is noted. No bony  abnormality is seen. IMPRESSION: Chronic scarring in the left apex.  No acute abnormality noted. Electronically Signed   By: Inez Catalina M.D.   On: 05/24/2021 22:31   CT Head Wo Contrast  Result Date: 05/24/2021 CLINICAL DATA:  75 year old male with altered mental status. EXAM: CT HEAD WITHOUT CONTRAST TECHNIQUE: Contiguous axial images were obtained from the base of the skull through the vertex without intravenous contrast. COMPARISON:  None. FINDINGS: Brain: There is mild age-related atrophy and moderate chronic microvascular ischemic changes. There is no acute intracranial hemorrhage. No mass effect midline shift. No extra-axial fluid collection. Vascular: No hyperdense vessel or unexpected calcification. Skull: Normal. Negative for fracture or focal lesion. Sinuses/Orbits: No acute finding. Other: None IMPRESSION: 1. No acute intracranial pathology. 2. Age-related atrophy and chronic microvascular ischemic changes. Electronically Signed   By: Anner Crete M.D.   On: 05/24/2021 23:46    Procedures Procedures   Medications Ordered in ED Medications  acetaminophen (TYLENOL) tablet 650 mg (650 mg Oral Given 05/24/21 2359)  cefTRIAXone (ROCEPHIN) 1 g in sodium chloride 0.9 % 100 mL IVPB (1 g Intravenous New Bag/Given 05/25/21 0113)    ED Course  I have reviewed the  triage vital signs and the nursing notes.  Pertinent labs & imaging results that were available during my care of the patient were reviewed by me and considered in my medical decision making (see chart for details).  Clinical Course as of 05/25/21 0200  Wed May 25, 2021  0157 Patient's wife at the bedside.  Confirms generalized confusion.  I discussed with the patient and his wife evidence of UTI.  Likely related to instrumentation.  He received a dose of Rocephin.  He is clinically stable with normal blood pressure.  They would like to trial antibiotics at home.  Feel this is reasonable as he does not appear septic at this time.  [CH]    Clinical Course User Index [CH] Concettina Leth, Barbette Hair, MD   MDM Rules/Calculators/A&P                          Patient presents with confusion.  Noted to be febrile.  He is overall nontoxic-appearing and vital signs are otherwise fairly reassuring.  He has no complaints for me.  While he is oriented x3, he does not answer questions appropriately and cannot provide much history.  Sepsis work-up initiated from triage.  Lactate normal.  He does have a leukocytosis.  Chest x-ray without pneumothorax or pneumonia.  Given recent bladder instrumentation, requested in and out cath to evaluate for urinary tract infection.  He is nitrite positive with greater than 50 white cells and many bacteria.  Urine culture was sent.  He was given IV Rocephin.  See clinical course above.  He is remained hemodynamically stable without signs or symptoms of severe sepsis or septic shock.  Discussed options including ops hospitalization versus outpatient antibiotic.  Patient prefers to go home.  Feel this is reasonable.  Will trial with Keflex.  After history, exam, and medical workup I feel the patient has been appropriately medically screened and is safe for discharge home. Pertinent diagnoses were discussed with the patient. Patient was given return precautions.  Final Clinical Impression(s) / ED Diagnoses Final diagnoses:  Complicated UTI (urinary tract infection)    Rx / DC Orders ED Discharge Orders          Ordered    cephALEXin (KEFLEX) 500 MG capsule  3 times daily        05/25/21 0159             Clayton Jarmon, Barbette Hair, MD 05/25/21 0201

## 2021-05-24 NOTE — ED Triage Notes (Signed)
Pt started having fever and confusion today after procedure. Pt had dialysis today.

## 2021-05-24 NOTE — Progress Notes (Addendum)
BCG Bladder Instillation  BCG # 3 of 6  Due to Bladder Cancer patient is present today for a BCG treatment. Patient was cleaned and prepped in a sterile fashion with betadine. A 14FR catheter was inserted, urine return was noted 77ml, urine was yellow in color.  63ml of reconstituted BCG was instilled into the bladder. The catheter was then removed. Patient tolerated well, no complications were noted. Patient returned to clinic after 2 hrs to have bladder drained and irrigated with sterile water.   Performed by: Kinga Cassar, LPN  Follow up/ Additional notes: keep next scheduled [place]

## 2021-05-24 NOTE — ED Notes (Signed)
Patient transported to X-ray 

## 2021-05-25 ENCOUNTER — Ambulatory Visit: Payer: Medicare Other | Admitting: Nurse Practitioner

## 2021-05-25 ENCOUNTER — Ambulatory Visit: Payer: Medicare Other | Admitting: Gastroenterology

## 2021-05-25 ENCOUNTER — Telehealth: Payer: Self-pay

## 2021-05-25 LAB — RESP PANEL BY RT-PCR (FLU A&B, COVID) ARPGX2
Influenza A by PCR: NEGATIVE
Influenza B by PCR: NEGATIVE
SARS Coronavirus 2 by RT PCR: NEGATIVE

## 2021-05-25 LAB — URINALYSIS, MICROSCOPIC (REFLEX): WBC, UA: >50 WBC/hpf (ref 0–5)

## 2021-05-25 LAB — LACTIC ACID, PLASMA: Lactic Acid, Venous: 1.7 mmol/L (ref 0.5–1.9)

## 2021-05-25 LAB — URINALYSIS, ROUTINE W REFLEX MICROSCOPIC
Glucose, UA: 100 mg/dL — AB
Ketones, ur: NEGATIVE mg/dL
Nitrite: POSITIVE — AB
Protein, ur: >300 mg/dL — AB
Specific Gravity, Urine: 1.025 (ref 1.005–1.030)
pH: 7 (ref 5.0–8.0)

## 2021-05-25 MED ORDER — CEPHALEXIN 500 MG PO CAPS: 500.0000 mg | ORAL_CAPSULE | Freq: Three times a day (TID) | ORAL | 0 refills | Status: DC

## 2021-05-25 MED ORDER — SODIUM CHLORIDE 0.9 % IV SOLN
1.0000 g | Freq: Once | INTRAVENOUS | Status: AC
  Administered 2021-05-25: 1 g via INTRAVENOUS
  Filled 2021-05-25: qty 10

## 2021-05-25 NOTE — Telephone Encounter (Signed)
Patient called with complaints of not feeling well after BCG treatment yesterday. Pt went to ER last night and is being treated for UTI.  Pt is unsure if he can complete treatments. MD sent message.

## 2021-05-25 NOTE — Discharge Instructions (Addendum)
You were seen today for fever and confusion.  You have evidence of urinary tract infection.  Take antibiotics as prescribed.  If you have persistent fevers, worsening confusion, develop any new or worsening symptoms you should be reevaluated.

## 2021-05-26 LAB — URINE CULTURE: Culture: NO GROWTH

## 2021-05-26 NOTE — Telephone Encounter (Signed)
Pt called office today to f/u from phone call yesterday.  Patient stated ER treated him for UTI  Culture shows no growth. Reviewed with patient. First two BCG treatments patient was only given 1/2 dose of BCG to due national drug shortage. At last OV for BCG patient was given full dose ( medication supply available again in office) pt reports after receiving that treatment he had body shakes, confusion and bladder pain.  Reviewed with patient his urine showed inflammatory response as culture was negative.   Instructed patient we would only given 1/2 doses for remaining treatments due to side effects.  Message sent to Dr. Alyson Ingles as well.

## 2021-05-27 DIAGNOSIS — N186 End stage renal disease: Secondary | ICD-10-CM | POA: Diagnosis not present

## 2021-05-27 DIAGNOSIS — Z992 Dependence on renal dialysis: Secondary | ICD-10-CM | POA: Diagnosis not present

## 2021-05-27 DIAGNOSIS — D689 Coagulation defect, unspecified: Secondary | ICD-10-CM | POA: Diagnosis not present

## 2021-05-27 DIAGNOSIS — Z79899 Other long term (current) drug therapy: Secondary | ICD-10-CM | POA: Diagnosis not present

## 2021-05-27 DIAGNOSIS — I2510 Atherosclerotic heart disease of native coronary artery without angina pectoris: Secondary | ICD-10-CM | POA: Diagnosis not present

## 2021-05-27 DIAGNOSIS — E039 Hypothyroidism, unspecified: Secondary | ICD-10-CM | POA: Diagnosis not present

## 2021-05-27 DIAGNOSIS — N2581 Secondary hyperparathyroidism of renal origin: Secondary | ICD-10-CM | POA: Diagnosis not present

## 2021-05-27 DIAGNOSIS — R52 Pain, unspecified: Secondary | ICD-10-CM | POA: Diagnosis not present

## 2021-05-28 DIAGNOSIS — D689 Coagulation defect, unspecified: Secondary | ICD-10-CM | POA: Diagnosis not present

## 2021-05-28 DIAGNOSIS — N2581 Secondary hyperparathyroidism of renal origin: Secondary | ICD-10-CM | POA: Diagnosis not present

## 2021-05-28 DIAGNOSIS — E039 Hypothyroidism, unspecified: Secondary | ICD-10-CM | POA: Diagnosis not present

## 2021-05-28 DIAGNOSIS — N186 End stage renal disease: Secondary | ICD-10-CM | POA: Diagnosis not present

## 2021-05-28 DIAGNOSIS — Z992 Dependence on renal dialysis: Secondary | ICD-10-CM | POA: Diagnosis not present

## 2021-05-28 DIAGNOSIS — R52 Pain, unspecified: Secondary | ICD-10-CM | POA: Diagnosis not present

## 2021-05-30 ENCOUNTER — Other Ambulatory Visit: Payer: Self-pay

## 2021-05-30 ENCOUNTER — Ambulatory Visit (INDEPENDENT_AMBULATORY_CARE_PROVIDER_SITE_OTHER): Payer: Medicare Other

## 2021-05-30 DIAGNOSIS — C678 Malignant neoplasm of overlapping sites of bladder: Secondary | ICD-10-CM | POA: Diagnosis not present

## 2021-05-30 MED ORDER — BCG LIVE 50 MG IS SUSR
3.2400 mL | Freq: Once | INTRAVESICAL | Status: AC
  Administered 2021-05-30: 81 mg via INTRAVESICAL

## 2021-05-30 NOTE — Progress Notes (Signed)
BCG Bladder Instillation  BCG # 4  Due to Bladder Cancer patient is present today for a BCG treatment. Patient was cleaned and prepped in a sterile fashion with betadine. A 14FR catheter was inserted, urine return was noted 50ml, urine was none in color.  43ml of reconstituted BCG was instilled into the bladder. The catheter was then removed. Patient tolerated well, no complications were noted  Preformed by: Estill Bamberg RN  Follow up/ Additional notes: return in 2 hours to have bladder drained and flushed. Pt dialysis patient.      Patient returned 05/30/2021- Pt is prepped for and in and out catherization. Patient was cleaned and prepped in a sterle fashion with betadine. A 14 fr catheter foley was inserted. Urine return was note 50 ml.  Performed by Lurline Hare  Flushed with 100cc of sterile saline as well.

## 2021-05-31 ENCOUNTER — Telehealth: Payer: Self-pay

## 2021-05-31 DIAGNOSIS — N2581 Secondary hyperparathyroidism of renal origin: Secondary | ICD-10-CM | POA: Diagnosis not present

## 2021-05-31 DIAGNOSIS — E039 Hypothyroidism, unspecified: Secondary | ICD-10-CM | POA: Diagnosis not present

## 2021-05-31 DIAGNOSIS — D689 Coagulation defect, unspecified: Secondary | ICD-10-CM | POA: Diagnosis not present

## 2021-05-31 DIAGNOSIS — Z992 Dependence on renal dialysis: Secondary | ICD-10-CM | POA: Diagnosis not present

## 2021-05-31 DIAGNOSIS — R52 Pain, unspecified: Secondary | ICD-10-CM | POA: Diagnosis not present

## 2021-05-31 DIAGNOSIS — N186 End stage renal disease: Secondary | ICD-10-CM | POA: Diagnosis not present

## 2021-05-31 NOTE — Telephone Encounter (Signed)
Patient called today after receiving BCG treatment number 4.  Reports same symptoms after of confusion and trembling.  Patient wishes to stop all treatment of BCG.

## 2021-06-01 ENCOUNTER — Other Ambulatory Visit: Payer: Self-pay

## 2021-06-01 ENCOUNTER — Encounter (HOSPITAL_COMMUNITY): Payer: Self-pay | Admitting: Physical Therapy

## 2021-06-01 ENCOUNTER — Ambulatory Visit (HOSPITAL_COMMUNITY): Payer: Medicare Other | Attending: Internal Medicine | Admitting: Physical Therapy

## 2021-06-01 DIAGNOSIS — M6281 Muscle weakness (generalized): Secondary | ICD-10-CM | POA: Diagnosis not present

## 2021-06-01 DIAGNOSIS — J449 Chronic obstructive pulmonary disease, unspecified: Secondary | ICD-10-CM | POA: Diagnosis not present

## 2021-06-01 DIAGNOSIS — R2689 Other abnormalities of gait and mobility: Secondary | ICD-10-CM | POA: Insufficient documentation

## 2021-06-01 DIAGNOSIS — R29898 Other symptoms and signs involving the musculoskeletal system: Secondary | ICD-10-CM | POA: Diagnosis not present

## 2021-06-01 NOTE — Patient Instructions (Signed)
Marching In-Place    Standing straight, alternate bringing knees toward trunk. Bent arms swing alternately. Do ___ sets. Do ___ times per day.  Copyright  VHI. All rights reserved.  Sit to Stand / Stand to Sit / Transfers    Sit on edge of a solid chair with arms, feet flat on floor. Lean forward over feet and stand up with hands on chair arms. Sit down slowly with hands on chair arms. Repeat ____ times per session. Do ____ sessions per day.  Copyright  VHI. All rights reserved.

## 2021-06-01 NOTE — Therapy (Signed)
[place], [place], [address] Phone: [phone]   Fax:  [phone]  Physical Therapy Evaluation  Patient Details  Name: Jeremy Johnson MRN: [id number] Date of Birth: [birth year] Referring Provider (PT): [name] MD   Encounter Date: 06/01/2021   PT End of Session - 06/01/21 0907     Visit Number 1    Number of Visits 12    Date for PT Re-Evaluation 07/13/21    Authorization Type Primary [employer] medicare (no vl, no auth), Secondary [employer] (vl 30, no auth)    Progress Note Due on Visit 10    PT Start Time 0830    PT Stop Time 0905    PT Time Calculation (min) 35 min    Activity Tolerance Patient tolerated treatment well    Behavior During Therapy [facility] for tasks assessed/performed             Past Medical History:  Diagnosis Date   Anemia    Blood transfusion without reported diagnosis    CAD (coronary artery disease)    STENT... MID CIRCUMFLEX...1997   Chronic kidney disease    STAGE 3   COPD (chronic obstructive pulmonary disease) (HCC)    Degenerative joint disease (DJD) of lumbar spine    GERD (gastroesophageal reflux disease)    Gout    Hyperlipidemia    Hypertension    Hypothyroidism    Incisional hernia    abdomen   Leukocytosis    CHRONIC MILD   Myocardial infarction ([place])    1997   SCL CA dx'd 01/2019   Lung cancer    Past Surgical History:  Procedure Laterality Date   ABDOMINAL AORTIC ANEURYSM REPAIR  2006   AV FISTULA PLACEMENT Left 04/25/2019   Procedure: ARTERIOVENOUS (AV) FISTULA CREATION LEFT ARM;  Surgeon: Angelia Mould, MD;  Location: [place];  Service: Vascular;  Laterality: Left;   AV FISTULA PLACEMENT Left 05/19/2019   Procedure: CONVERSION OF LEFT ARM ARTERIOVENOUS FISTULA TO GRAFT;  Surgeon: Angelia Mould, MD;  Location: [place];  Service: Vascular;  Laterality: Left;   BIOPSY  09/30/2018   Procedure: BIOPSY;  Surgeon: Danie Binder, MD;  [facility];  Service: Endoscopy;;  ascending colon   BIOPSY  07/27/2020   Procedure: BIOPSY;  Surgeon: Eloise Harman, DO;  [facility];  Service: Endoscopy;;  gastric   BUBBLE STUDY  09/23/2020   Procedure: BUBBLE STUDY;  Surgeon: Skeet Latch, MD;  Location: [place];  Service: Cardiovascular;;   CARDIOVERSION N/A 09/23/2020   Procedure: CARDIOVERSION;  Surgeon: Skeet Latch, MD;  Location: [place];  Service: Cardiovascular;  Laterality: N/A;   COLONOSCOPY  2008   COLONOSCOPY N/A 09/30/2018   External and internal hemorrhoids, six polyps removed, one ascending colon polypoid lesion biopsied. Six simple adenomas and one benign polypoid lesion. Colonoscopy Nov 2022.    COLONOSCOPY WITH PROPOFOL N/A 07/27/2020   non-bleeding internal hemorrhoids, sigmoid and descending colon diverticulosis, four 1-2 mm polyps in ascending colon, one 5 mm polyp in transverse colon. 3 year surveillance. Tubular adenomas.    CORONARY ANGIOPLASTY WITH STENT PLACEMENT  1997   MID CIRCUMFLEX   CORONARY STENT INTERVENTION N/A 09/20/2020   Procedure: CORONARY STENT INTERVENTION;  Surgeon: Martinique, Peter M, MD;  Location: [place] [facility];  Service: Cardiovascular;  Laterality: N/A;   CYSTOSCOPY W/ RETROGRADES Bilateral 03/28/2021   Procedure: CYSTOSCOPY WITH RETROGRADE PYELOGRAM;  Surgeon: Nicolette Bang  L, MD;  [facility]: [facility];  Service: Urology;  Laterality: Bilateral;   ESOPHAGOGASTRODUODENOSCOPY (EGD) WITH PROPOFOL N/A 07/27/2020   Food in middle third of esophagus, gastritis s/p biopsy, nodular mucosa in lesser curvature of stomach s/p biopsy. Negative H.pylori.    HIP ARTHROPLASTY Right 04/30/2020   Procedure: ARTHROPLASTY  HIP (HEMIARTHROPLASTY);  Surgeon: Altamese Corralitos, MD;  Location: [place];  Service: Orthopedics;  Laterality: Right;   INTRAVASCULAR ULTRASOUND/IVUS N/A 09/20/2020   Procedure: Intravascular Ultrasound/IVUS;  Surgeon: Martinique, Peter M, MD;   Location: [place] [facility];  Service: Cardiovascular;  Laterality: N/A;   IR FLUORO GUIDE CV LINE RIGHT  04/22/2019   IR FLUORO GUIDE CV LINE RIGHT  05/01/2020   IR THORACENTESIS ASP PLEURAL SPACE W/IMG GUIDE  09/22/2020   IR THROMBECTOMY AV FISTULA W/THROMBOLYSIS/PTA INC/SHUNT/IMG LEFT Left 05/03/2020   IR US GUIDE VASC ACCESS LEFT  05/03/2020   IR US GUIDE VASC ACCESS RIGHT  04/22/2019   IR US GUIDE VASC ACCESS RIGHT  05/01/2020   POLYPECTOMY  09/30/2018   Procedure: POLYPECTOMY;  Surgeon: Danie Binder, MD;  [facility];  Service: Endoscopy;;  colon   POLYPECTOMY  07/27/2020   Procedure: POLYPECTOMY;  Surgeon: Eloise Harman, DO;  [facility];  Service: Endoscopy;;   RIGHT/LEFT HEART CATH AND CORONARY ANGIOGRAPHY N/A 09/20/2020   Procedure: RIGHT/LEFT HEART CATH AND CORONARY ANGIOGRAPHY;  Surgeon: Martinique, Peter M, MD;  Location: [place] [facility];  Service: Cardiovascular;  Laterality: N/A;   TEE WITHOUT CARDIOVERSION N/A 09/23/2020   Procedure: TRANSESOPHAGEAL ECHOCARDIOGRAM (TEE);  Surgeon: Skeet Latch, MD;  Location: [place];  Service: Cardiovascular;  Laterality: N/A;   TRANSURETHRAL RESECTION OF BLADDER TUMOR N/A 03/28/2021   Procedure: TRANSURETHRAL RESECTION OF BLADDER TUMOR (TURBT);  Surgeon: Cleon Gustin, MD;  [facility];  Service: Urology;  Laterality: N/A;   VIDEO BRONCHOSCOPY WITH ENDOBRONCHIAL NAVIGATION N/A 01/27/2019   Procedure: VIDEO BRONCHOSCOPY WITH ENDOBRONCHIAL NAVIGATION;  Surgeon: Grace Isaac, MD;  Location: [place];  Service: Thoracic;  Laterality: N/A;   VIDEO BRONCHOSCOPY WITH ENDOBRONCHIAL ULTRASOUND N/A 01/27/2019   Procedure: VIDEO BRONCHOSCOPY WITH ENDOBRONCHIAL ULTRASOUND;  Surgeon: Grace Isaac, MD;  Location: [place];  Service: Thoracic;  Laterality: N/A;    There were no vitals filed for this visit.    Subjective Assessment - 06/01/21 0830     Subjective Patient is a 75 y.o. male who presents to  physical therapy with referral for falls. He broke his hip about a year ago and had HHPT. He is now losing his balance and falling. He has had 2 falls recently. He cant get back up when he falls. He does alright with cane a lot of times. Sometimes he has it rough after dialysis. He does stairs one leg at time mostly using left and avoiding R - hip he broke.    Patient is accompained by: Family member    Pertinent History R hip fracture last year, bladder cancer and treatment    Limitations Standing;Walking;House hold activities    How long can you walk comfortably? 15-20 minutes treadmill flat with UE support    Patient Stated Goals improve balance and strength    Currently in Pain? No/denies                [facility] PT Assessment - 06/01/21 0001       Assessment   Medical Diagnosis Falls    Referring Provider (PT) [name] MD  Onset Date/Surgical Date 12/02/20    Next MD Visit Friday    Prior Therapy HHPT for hip      Precautions   Precautions Fall      Restrictions   Weight Bearing Restrictions No      Balance Screen   Has the patient fallen in the past 6 months Yes    How many times? 2    Has the patient had a decrease in activity level because of a fear of falling?  Yes    Is the patient reluctant to leave their home because of a fear of falling?  No      Prior Function   Level of Independence Independent      Cognition   Overall Cognitive Status Within Functional Limits for tasks assessed      Observation/Other Assessments   Observations Ambulates with SPC, wife present    Focus on Therapeutic Outcomes (FOTO)  n/a      Posture/Postural Control   Posture Comments forward head, rounded shoulders, slouched      ROM / Strength   AROM / PROM / Strength Strength      Strength   Strength Assessment Site Hip;Knee;Ankle    Right/Left Hip Right;Left    Right Hip Flexion 4-/5    Left Hip Flexion 4-/5    Right/Left Knee Right;Left    Right Knee Flexion 5/5    Right  Knee Extension 4+/5    Left Knee Flexion 5/5    Left Knee Extension 4+/5    Right/Left Ankle Right;Left    Right Ankle Dorsiflexion 5/5    Left Ankle Dorsiflexion 5/5      Transfers   Five time sit to stand comments  17.13 seconds without UE support      Ambulation/Gait   Ambulation/Gait Yes    Ambulation/Gait Assistance 6: Modified independent (Device/Increase time)    Ambulation Distance (Feet) 325 Feet    Assistive device Straight cane    Gait Pattern Step-through pattern    Ambulation Surface Level;Indoor    Gait velocity decreased    Gait Comments 2MWT      Standardized Balance Assessment   Standardized Balance Assessment Dynamic Gait Index      Dynamic Gait Index   Level Surface Mild Impairment    Change in Gait Speed Mild Impairment    Gait with Horizontal Head Turns Moderate Impairment    Gait with Vertical Head Turns Mild Impairment    Gait and Pivot Turn Mild Impairment    Step Over Obstacle Mild Impairment    Step Around Obstacles Mild Impairment    Steps Mild Impairment    Total Score 15    DGI comment: increased risk for falls                        Objective measurements completed on examination: See above findings.       [facility] Adult PT Treatment/Exercise - 06/01/21 0001       Exercises   Exercises Knee/Hip      Knee/Hip Exercises: Standing   Hip Flexion 10 reps    Hip Flexion Limitations alternating march      Knee/Hip Exercises: Seated   Sit to Sand 10 reps;2 sets                    PT Education - 06/01/21 3893     Education Details Patient educated on exam findings, POC, scope of PT, HEP  Person(s) Educated Patient;Spouse    Methods Explanation    Comprehension Verbalized understanding              PT Short Term Goals - 06/01/21 0911       PT SHORT TERM GOAL #1   Title Patient will be independent with HEP in order to improve functional outcomes.    Time 3    Period Weeks    Status New    Target  Date 06/22/21      PT SHORT TERM GOAL #2   Title Patient will report at least 25% improvement in symptoms for improved quality of life.    Time 3    Period Weeks    Status New    Target Date 06/22/21               PT Long Term Goals - 06/01/21 0911       PT LONG TERM GOAL #1   Title Patient will report at least 75% improvement in symptoms for improved quality of life.    Time 6    Period Weeks    Status New    Target Date 07/13/21      PT LONG TERM GOAL #2   Title Patient will be able to complete 5x STS in under 11.4 seconds in order to reduce the risk of falls.    Time 6    Period Weeks    Status New    Target Date 07/13/21      PT LONG TERM GOAL #3   Title Patient will be able to ambulate at least 400 feet in 2MWT in order to demonstrate improved gait speed for community ambulation.    Time 6    Period Weeks    Status New    Target Date 07/13/21      PT LONG TERM GOAL #4   Title Patient will score at least 19/24 on DGI in order to demonstrate decreased fall risk.    Time 6    Period Weeks    Status New    Target Date 07/13/21                    Plan - 06/01/21 0908     Clinical Impression Statement Patient is a 75 y.o. male who presents to physical therapy with referral for falls. He presents with deficits in LE strength, endurance, postural impairments, gait, balance and functional mobility with ADL. He is having to modify and restrict ADL as indicated by DGI score as well as subjective information and objective measures which is affecting overall participation. Patient will benefit from skilled physical therapy in order to improve function and reduce impairment.    Personal Factors and Comorbidities Age;Fitness;Past/Current Experience;Comorbidity 3+;Time since onset of injury/illness/exacerbation    Comorbidities hx cancer, hx R hip fracture, falls    Examination-Activity Limitations Locomotion Level;Transfers;Stand;Stairs;Squat;Lift;Carry     Examination-Participation Restrictions Meal Prep;Cleaning;Community Activity;Shop;Volunteer;Yard Work    Stability/Clinical Decision Making Stable/Uncomplicated    Designer, jewellery Low    Rehab Potential Good    PT Frequency 2x / week    PT Duration 6 weeks    PT Treatment/Interventions ADLs/Self Care Home Management;Aquatic Therapy;Electrical Stimulation;Iontophoresis 4mg /ml Dexamethasone;Moist Heat;Traction;DME Instruction;Gait training;Stair training;Functional mobility training;Therapeutic activities;Therapeutic exercise;Balance training;Neuromuscular re-education;Cognitive remediation;Patient/family education;Orthotic Fit/Training;Manual techniques;Compression bandaging;Passive range of motion;Dry needling;Energy conservation;Splinting;Taping    PT Next Visit Plan begin functional strength training, balance and gait training, evenually work on standing<> floor transfers    PT [place]  STS, standing marching    Consulted and Agree with Plan of Care Patient;Family member/caregiver    Family Member Consulted wife             Patient will benefit from skilled therapeutic intervention in order to improve the following deficits and impairments:  Abnormal gait, Difficulty walking, Decreased endurance, Decreased activity tolerance, Decreased balance, Impaired flexibility, Improper body mechanics, Decreased strength, Decreased mobility  Visit Diagnosis: Other abnormalities of gait and mobility  Muscle weakness (generalized)  Other symptoms and signs involving the musculoskeletal system     Problem List Patient Active Problem List   Diagnosis Date Noted   Malignant neoplasm of overlapping sites of bladder ([place]) 04/12/2021   Bladder tumor 12/07/2020   Gross hematuria 12/07/2020   Elevated LFTs 09/29/2020   Dysphagia 09/29/2020   A-fib ([place]) 09/22/2020   Palliative care by specialist    DNR (do not resuscitate) discussion    Acute systolic CHF (congestive heart  failure) ([place])    Restless leg syndrome 09/17/2020   Elevated troponin I level 09/17/2020   Loss of weight 06/09/2020   Early satiety 06/09/2020   Closed displaced fracture of right femoral neck with delayed healing 04/29/2020   Closed displaced fracture of right femoral neck ([place]) 04/29/2020   Acute respiratory failure with hypoxia ([place]) 04/06/2020   Acute and chronic respiratory failure with hypoxia ([place]) 04/05/2020   Chronic obstructive pulmonary disease/emphysema    Anaphylactic reaction due to adverse effect of correct drug or medicament properly administered, initial encounter 07/30/2019   Hypocalcemia 05/31/2019   Other disorders of phosphorus metabolism 05/31/2019   Pancytopenia ([place]) 05/22/2019   ESRD (end stage renal disease) on dialysis ([place]) 05/22/2019   Unspecified protein-calorie malnutrition ([place]) 05/02/2019   Coagulation defect, unspecified ([place]) 04/26/2019   Diarrhea, unspecified 04/26/2019   Hypokalemia 04/26/2019   Pain, unspecified 04/26/2019   Pruritus, unspecified 04/26/2019   Secondary hyperparathyroidism of renal origin ([place]) 04/26/2019   Encounter for immunization 04/26/2019   Incisional hernia without obstruction or gangrene 04/25/2019   Malignant neoplasm of unspecified part of unspecified bronchus or lung ([place]) 04/25/2019   Other specified degenerative diseases of nervous system ([place]) 04/25/2019   Anemia in chronic kidney disease 04/25/2019   Atherosclerotic heart disease of native coronary artery without angina pectoris 04/25/2019   Gastro-esophageal reflux disease without esophagitis 04/25/2019   Hypothyroidism, unspecified 04/25/2019   Acute renal failure superimposed on stage 3 chronic kidney disease ([place]) 04/16/2019   Anemia in chronic kidney disease (CKD) 04/14/2019   Small cell lung cancer ([place]) 02/06/2019   Encounter for antineoplastic chemotherapy 02/06/2019   Goals of care, counseling/discussion 02/06/2019   Special screening for malignant  neoplasms, colon    GERD (gastroesophageal reflux disease)    Hyperlipidemia    Gout    Hypothyroidism    Degenerative joint disease (DJD) of lumbar spine    Acquired hypothyroidism 02/02/2010   HLD (hyperlipidemia) 02/02/2010   Essential hypertension 02/02/2010   GERD 02/02/2010   ABDOMINAL AORTIC ANEURYSM REPAIR, HX OF 02/02/2010    9:14 AM, 06/01/21 Mearl Latin PT, DPT Physical Therapist at [place], [place], [address] Phone: [phone]   Fax:  [phone]  Name: ARLEIGH ODOWD MRN: [id number] Date of Birth: Jan 02, 1946

## 2021-06-02 DIAGNOSIS — N186 End stage renal disease: Secondary | ICD-10-CM | POA: Diagnosis not present

## 2021-06-02 DIAGNOSIS — Z992 Dependence on renal dialysis: Secondary | ICD-10-CM | POA: Diagnosis not present

## 2021-06-02 DIAGNOSIS — N2581 Secondary hyperparathyroidism of renal origin: Secondary | ICD-10-CM | POA: Diagnosis not present

## 2021-06-02 DIAGNOSIS — R52 Pain, unspecified: Secondary | ICD-10-CM | POA: Diagnosis not present

## 2021-06-02 DIAGNOSIS — E039 Hypothyroidism, unspecified: Secondary | ICD-10-CM | POA: Diagnosis not present

## 2021-06-02 DIAGNOSIS — D689 Coagulation defect, unspecified: Secondary | ICD-10-CM | POA: Diagnosis not present

## 2021-06-03 DIAGNOSIS — E785 Hyperlipidemia, unspecified: Secondary | ICD-10-CM | POA: Diagnosis not present

## 2021-06-03 DIAGNOSIS — C679 Malignant neoplasm of bladder, unspecified: Secondary | ICD-10-CM | POA: Diagnosis not present

## 2021-06-04 DIAGNOSIS — D689 Coagulation defect, unspecified: Secondary | ICD-10-CM | POA: Diagnosis not present

## 2021-06-04 DIAGNOSIS — Z992 Dependence on renal dialysis: Secondary | ICD-10-CM | POA: Diagnosis not present

## 2021-06-04 DIAGNOSIS — N186 End stage renal disease: Secondary | ICD-10-CM | POA: Diagnosis not present

## 2021-06-04 DIAGNOSIS — E039 Hypothyroidism, unspecified: Secondary | ICD-10-CM | POA: Diagnosis not present

## 2021-06-04 DIAGNOSIS — R52 Pain, unspecified: Secondary | ICD-10-CM | POA: Diagnosis not present

## 2021-06-04 DIAGNOSIS — N2581 Secondary hyperparathyroidism of renal origin: Secondary | ICD-10-CM | POA: Diagnosis not present

## 2021-06-06 ENCOUNTER — Ambulatory Visit: Payer: Medicare Other

## 2021-06-07 DIAGNOSIS — D689 Coagulation defect, unspecified: Secondary | ICD-10-CM | POA: Diagnosis not present

## 2021-06-07 DIAGNOSIS — E039 Hypothyroidism, unspecified: Secondary | ICD-10-CM | POA: Diagnosis not present

## 2021-06-07 DIAGNOSIS — N2581 Secondary hyperparathyroidism of renal origin: Secondary | ICD-10-CM | POA: Diagnosis not present

## 2021-06-07 DIAGNOSIS — N186 End stage renal disease: Secondary | ICD-10-CM | POA: Diagnosis not present

## 2021-06-07 DIAGNOSIS — Z992 Dependence on renal dialysis: Secondary | ICD-10-CM | POA: Diagnosis not present

## 2021-06-07 DIAGNOSIS — R52 Pain, unspecified: Secondary | ICD-10-CM | POA: Diagnosis not present

## 2021-06-08 ENCOUNTER — Ambulatory Visit (HOSPITAL_COMMUNITY): Payer: Medicare Other

## 2021-06-08 ENCOUNTER — Telehealth (HOSPITAL_COMMUNITY): Payer: Self-pay

## 2021-06-08 NOTE — Telephone Encounter (Signed)
Pt will not return until 06/20/21 due to testing positive for COVID.

## 2021-06-09 DIAGNOSIS — R52 Pain, unspecified: Secondary | ICD-10-CM | POA: Diagnosis not present

## 2021-06-09 DIAGNOSIS — N186 End stage renal disease: Secondary | ICD-10-CM | POA: Diagnosis not present

## 2021-06-09 DIAGNOSIS — Z992 Dependence on renal dialysis: Secondary | ICD-10-CM | POA: Diagnosis not present

## 2021-06-09 DIAGNOSIS — U071 COVID-19: Secondary | ICD-10-CM | POA: Diagnosis not present

## 2021-06-09 DIAGNOSIS — E039 Hypothyroidism, unspecified: Secondary | ICD-10-CM | POA: Diagnosis not present

## 2021-06-09 DIAGNOSIS — D689 Coagulation defect, unspecified: Secondary | ICD-10-CM | POA: Diagnosis not present

## 2021-06-09 DIAGNOSIS — N2581 Secondary hyperparathyroidism of renal origin: Secondary | ICD-10-CM | POA: Diagnosis not present

## 2021-06-11 DIAGNOSIS — R52 Pain, unspecified: Secondary | ICD-10-CM | POA: Diagnosis not present

## 2021-06-11 DIAGNOSIS — D689 Coagulation defect, unspecified: Secondary | ICD-10-CM | POA: Diagnosis not present

## 2021-06-11 DIAGNOSIS — N2581 Secondary hyperparathyroidism of renal origin: Secondary | ICD-10-CM | POA: Diagnosis not present

## 2021-06-11 DIAGNOSIS — Z992 Dependence on renal dialysis: Secondary | ICD-10-CM | POA: Diagnosis not present

## 2021-06-11 DIAGNOSIS — N186 End stage renal disease: Secondary | ICD-10-CM | POA: Diagnosis not present

## 2021-06-11 DIAGNOSIS — E039 Hypothyroidism, unspecified: Secondary | ICD-10-CM | POA: Diagnosis not present

## 2021-06-12 DIAGNOSIS — N186 End stage renal disease: Secondary | ICD-10-CM | POA: Diagnosis not present

## 2021-06-12 DIAGNOSIS — I129 Hypertensive chronic kidney disease with stage 1 through stage 4 chronic kidney disease, or unspecified chronic kidney disease: Secondary | ICD-10-CM | POA: Diagnosis not present

## 2021-06-12 DIAGNOSIS — Z992 Dependence on renal dialysis: Secondary | ICD-10-CM | POA: Diagnosis not present

## 2021-06-13 ENCOUNTER — Ambulatory Visit: Payer: Medicare Other

## 2021-06-13 ENCOUNTER — Encounter (HOSPITAL_COMMUNITY): Payer: Medicare Other | Admitting: Physical Therapy

## 2021-06-13 DIAGNOSIS — U071 COVID-19: Secondary | ICD-10-CM | POA: Diagnosis not present

## 2021-06-13 DIAGNOSIS — R21 Rash and other nonspecific skin eruption: Secondary | ICD-10-CM | POA: Diagnosis not present

## 2021-06-14 DIAGNOSIS — Z992 Dependence on renal dialysis: Secondary | ICD-10-CM | POA: Diagnosis not present

## 2021-06-14 DIAGNOSIS — N186 End stage renal disease: Secondary | ICD-10-CM | POA: Diagnosis not present

## 2021-06-14 DIAGNOSIS — N2581 Secondary hyperparathyroidism of renal origin: Secondary | ICD-10-CM | POA: Diagnosis not present

## 2021-06-14 DIAGNOSIS — D689 Coagulation defect, unspecified: Secondary | ICD-10-CM | POA: Diagnosis not present

## 2021-06-14 DIAGNOSIS — U071 COVID-19: Secondary | ICD-10-CM | POA: Diagnosis not present

## 2021-06-15 ENCOUNTER — Encounter (HOSPITAL_COMMUNITY): Payer: Medicare Other | Admitting: Physical Therapy

## 2021-06-16 DIAGNOSIS — D689 Coagulation defect, unspecified: Secondary | ICD-10-CM | POA: Diagnosis not present

## 2021-06-16 DIAGNOSIS — N186 End stage renal disease: Secondary | ICD-10-CM | POA: Diagnosis not present

## 2021-06-16 DIAGNOSIS — N2581 Secondary hyperparathyroidism of renal origin: Secondary | ICD-10-CM | POA: Diagnosis not present

## 2021-06-16 DIAGNOSIS — Z992 Dependence on renal dialysis: Secondary | ICD-10-CM | POA: Diagnosis not present

## 2021-06-18 DIAGNOSIS — N186 End stage renal disease: Secondary | ICD-10-CM | POA: Diagnosis not present

## 2021-06-18 DIAGNOSIS — Z992 Dependence on renal dialysis: Secondary | ICD-10-CM | POA: Diagnosis not present

## 2021-06-18 DIAGNOSIS — D689 Coagulation defect, unspecified: Secondary | ICD-10-CM | POA: Diagnosis not present

## 2021-06-18 DIAGNOSIS — N2581 Secondary hyperparathyroidism of renal origin: Secondary | ICD-10-CM | POA: Diagnosis not present

## 2021-06-20 ENCOUNTER — Ambulatory Visit (HOSPITAL_COMMUNITY): Payer: Medicare Other | Admitting: Physical Therapy

## 2021-06-21 DIAGNOSIS — N186 End stage renal disease: Secondary | ICD-10-CM | POA: Diagnosis not present

## 2021-06-21 DIAGNOSIS — Z992 Dependence on renal dialysis: Secondary | ICD-10-CM | POA: Diagnosis not present

## 2021-06-21 DIAGNOSIS — D689 Coagulation defect, unspecified: Secondary | ICD-10-CM | POA: Diagnosis not present

## 2021-06-21 DIAGNOSIS — N2581 Secondary hyperparathyroidism of renal origin: Secondary | ICD-10-CM | POA: Diagnosis not present

## 2021-06-22 ENCOUNTER — Ambulatory Visit (HOSPITAL_COMMUNITY): Payer: Medicare Other

## 2021-06-23 DIAGNOSIS — N2581 Secondary hyperparathyroidism of renal origin: Secondary | ICD-10-CM | POA: Diagnosis not present

## 2021-06-23 DIAGNOSIS — D689 Coagulation defect, unspecified: Secondary | ICD-10-CM | POA: Diagnosis not present

## 2021-06-23 DIAGNOSIS — N186 End stage renal disease: Secondary | ICD-10-CM | POA: Diagnosis not present

## 2021-06-23 DIAGNOSIS — Z992 Dependence on renal dialysis: Secondary | ICD-10-CM | POA: Diagnosis not present

## 2021-06-25 DIAGNOSIS — D689 Coagulation defect, unspecified: Secondary | ICD-10-CM | POA: Diagnosis not present

## 2021-06-25 DIAGNOSIS — N186 End stage renal disease: Secondary | ICD-10-CM | POA: Diagnosis not present

## 2021-06-25 DIAGNOSIS — Z992 Dependence on renal dialysis: Secondary | ICD-10-CM | POA: Diagnosis not present

## 2021-06-25 DIAGNOSIS — N2581 Secondary hyperparathyroidism of renal origin: Secondary | ICD-10-CM | POA: Diagnosis not present

## 2021-06-27 ENCOUNTER — Encounter (HOSPITAL_COMMUNITY): Payer: Medicare Other

## 2021-06-28 DIAGNOSIS — N186 End stage renal disease: Secondary | ICD-10-CM | POA: Diagnosis not present

## 2021-06-28 DIAGNOSIS — N2581 Secondary hyperparathyroidism of renal origin: Secondary | ICD-10-CM | POA: Diagnosis not present

## 2021-06-28 DIAGNOSIS — D689 Coagulation defect, unspecified: Secondary | ICD-10-CM | POA: Diagnosis not present

## 2021-06-28 DIAGNOSIS — Z992 Dependence on renal dialysis: Secondary | ICD-10-CM | POA: Diagnosis not present

## 2021-06-29 ENCOUNTER — Other Ambulatory Visit: Payer: Self-pay

## 2021-06-29 ENCOUNTER — Ambulatory Visit (HOSPITAL_COMMUNITY): Payer: Medicare Other | Attending: Internal Medicine | Admitting: Physical Therapy

## 2021-06-29 ENCOUNTER — Encounter (HOSPITAL_COMMUNITY): Payer: Self-pay | Admitting: Physical Therapy

## 2021-06-29 DIAGNOSIS — R2689 Other abnormalities of gait and mobility: Secondary | ICD-10-CM | POA: Diagnosis not present

## 2021-06-29 DIAGNOSIS — M6281 Muscle weakness (generalized): Secondary | ICD-10-CM | POA: Insufficient documentation

## 2021-06-29 DIAGNOSIS — R29898 Other symptoms and signs involving the musculoskeletal system: Secondary | ICD-10-CM | POA: Insufficient documentation

## 2021-06-29 NOTE — Therapy (Signed)
[place], [place], [address] Phone: [phone]   Fax:  [phone]  Physical Therapy Treatment  Patient Details  Name: Jeremy Johnson MRN: [id number] Date of Birth: 1946/06/30 Referring Provider (PT): [name] MD   Encounter Date: 06/29/2021   PT End of Session - 06/29/21 0913     Visit Number 2    Number of Visits 12    Date for PT Re-Evaluation 07/13/21    Authorization Type Primary [employer] medicare (no vl, no auth), Secondary [employer] (vl 30, no auth)    Progress Note Due on Visit 10    PT Start Time 0915    PT Stop Time 0955    PT Time Calculation (min) 40 min    Activity Tolerance Patient tolerated treatment well    Behavior During Therapy [facility] for tasks assessed/performed             Past Medical History:  Diagnosis Date   Anemia    Blood transfusion without reported diagnosis    CAD (coronary artery disease)    STENT... MID CIRCUMFLEX...1997   Chronic kidney disease    STAGE 3   COPD (chronic obstructive pulmonary disease) (HCC)    Degenerative joint disease (DJD) of lumbar spine    GERD (gastroesophageal reflux disease)    Gout    Hyperlipidemia    Hypertension    Hypothyroidism    Incisional hernia    abdomen   Leukocytosis    CHRONIC MILD   Myocardial infarction ([place])    1997   SCL CA dx'd 01/2019   Lung cancer    Past Surgical History:  Procedure Laterality Date   ABDOMINAL AORTIC ANEURYSM REPAIR  2006   AV FISTULA PLACEMENT Left 04/25/2019   Procedure: ARTERIOVENOUS (AV) FISTULA CREATION LEFT ARM;  Surgeon: Angelia Mould, MD;  Location: [place];  Service: Vascular;  Laterality: Left;   AV FISTULA PLACEMENT Left 05/19/2019   Procedure: CONVERSION OF LEFT ARM ARTERIOVENOUS FISTULA TO GRAFT;  Surgeon: Angelia Mould, MD;  Location: [place];  Service: Vascular;  Laterality: Left;   BIOPSY  09/30/2018   Procedure: BIOPSY;  Surgeon: Danie Binder,  MD;  [facility];  Service: Endoscopy;;  ascending colon   BIOPSY  07/27/2020   Procedure: BIOPSY;  Surgeon: Eloise Harman, DO;  [facility];  Service: Endoscopy;;  gastric   BUBBLE STUDY  09/23/2020   Procedure: BUBBLE STUDY;  Surgeon: Skeet Latch, MD;  Location: [facility];  Service: Cardiovascular;;   CARDIOVERSION N/A 09/23/2020   Procedure: CARDIOVERSION;  Surgeon: Skeet Latch, MD;  Location: [place];  Service: Cardiovascular;  Laterality: N/A;   COLONOSCOPY  2008   COLONOSCOPY N/A 09/30/2018   External and internal hemorrhoids, six polyps removed, one ascending colon polypoid lesion biopsied. Six simple adenomas and one benign polypoid lesion. Colonoscopy Nov 2022.    COLONOSCOPY WITH PROPOFOL N/A 07/27/2020   non-bleeding internal hemorrhoids, sigmoid and descending colon diverticulosis, four 1-2 mm polyps in ascending colon, one 5 mm polyp in transverse colon. 3 year surveillance. Tubular adenomas.    CORONARY ANGIOPLASTY WITH STENT PLACEMENT  1997   MID CIRCUMFLEX   CORONARY STENT INTERVENTION N/A 09/20/2020   Procedure: CORONARY STENT INTERVENTION;  Surgeon: Martinique, Peter M, MD;  Location: [facility];  Service: Cardiovascular;  Laterality: N/A;   CYSTOSCOPY W/ RETROGRADES Bilateral 03/28/2021   Procedure: CYSTOSCOPY WITH RETROGRADE PYELOGRAM;  Surgeon: Nicolette Bang  L, MD;  [facility]: [facility];  Service: Urology;  Laterality: Bilateral;   ESOPHAGOGASTRODUODENOSCOPY (EGD) WITH PROPOFOL N/A 07/27/2020   Food in middle third of esophagus, gastritis s/p biopsy, nodular mucosa in lesser curvature of stomach s/p biopsy. Negative H.pylori.    HIP ARTHROPLASTY Right 04/30/2020   Procedure: ARTHROPLASTY  HIP (HEMIARTHROPLASTY);  Surgeon: Altamese [place], MD;  Location: [facility];  Service: Orthopedics;  Laterality: Right;   INTRAVASCULAR ULTRASOUND/IVUS N/A 09/20/2020   Procedure: Intravascular Ultrasound/IVUS;  Surgeon: Martinique, Peter M, MD;   Location: [facility];  Service: Cardiovascular;  Laterality: N/A;   IR FLUORO GUIDE CV LINE RIGHT  04/22/2019   IR FLUORO GUIDE CV LINE RIGHT  05/01/2020   IR THORACENTESIS ASP PLEURAL SPACE W/IMG GUIDE  09/22/2020   IR THROMBECTOMY AV FISTULA W/THROMBOLYSIS/PTA INC/SHUNT/IMG LEFT Left 05/03/2020   IR US GUIDE VASC ACCESS LEFT  05/03/2020   IR US GUIDE VASC ACCESS RIGHT  04/22/2019   IR US GUIDE VASC ACCESS RIGHT  05/01/2020   POLYPECTOMY  09/30/2018   Procedure: POLYPECTOMY;  Surgeon: Danie Binder, MD;  [facility];  Service: Endoscopy;;  colon   POLYPECTOMY  07/27/2020   Procedure: POLYPECTOMY;  Surgeon: Eloise Harman, DO;  [facility];  Service: Endoscopy;;   RIGHT/LEFT HEART CATH AND CORONARY ANGIOGRAPHY N/A 09/20/2020   Procedure: RIGHT/LEFT HEART CATH AND CORONARY ANGIOGRAPHY;  Surgeon: Martinique, Peter M, MD;  Location: [facility];  Service: Cardiovascular;  Laterality: N/A;   TEE WITHOUT CARDIOVERSION N/A 09/23/2020   Procedure: TRANSESOPHAGEAL ECHOCARDIOGRAM (TEE);  Surgeon: Skeet Latch, MD;  Location: [facility];  Service: Cardiovascular;  Laterality: N/A;   TRANSURETHRAL RESECTION OF BLADDER TUMOR N/A 03/28/2021   Procedure: TRANSURETHRAL RESECTION OF BLADDER TUMOR (TURBT);  Surgeon: Cleon Gustin, MD;  [facility];  Service: Urology;  Laterality: N/A;   VIDEO BRONCHOSCOPY WITH ENDOBRONCHIAL NAVIGATION N/A 01/27/2019   Procedure: VIDEO BRONCHOSCOPY WITH ENDOBRONCHIAL NAVIGATION;  Surgeon: Grace Isaac, MD;  Location: [facility];  Service: Thoracic;  Laterality: N/A;   VIDEO BRONCHOSCOPY WITH ENDOBRONCHIAL ULTRASOUND N/A 01/27/2019   Procedure: VIDEO BRONCHOSCOPY WITH ENDOBRONCHIAL ULTRASOUND;  Surgeon: Grace Isaac, MD;  Location: [place];  Service: Thoracic;  Laterality: N/A;    There were no vitals filed for this visit.   Subjective Assessment - 06/29/21 0915     Subjective Patient states he was feeling rough the last few  weeks. Been working on HEP a little    Patient is accompained by: Family member    Pertinent History R hip fracture last year, bladder cancer and treatment    Limitations Standing;Walking;House hold activities    How long can you walk comfortably? 15-20 minutes treadmill flat with UE support    Patient Stated Goals improve balance and strength    Currently in Pain? No/denies                               [facility] Adult PT Treatment/Exercise - 06/29/21 0001       Knee/Hip Exercises: Standing   Heel Raises Both;2 sets;10 reps    Knee Flexion Both;2 sets;10 reps    Hip Flexion 10 reps    Hip Flexion Limitations alternating march    Hip Abduction Both;2 sets;10 reps    Lateral Step Up Both;2 sets;10 reps;Hand Hold: 1;Step Height: 6"    Forward Step Up Both;2 sets;10 reps;Step Height: 6";Hand Hold: 0  Other Standing Knee Exercises Tandem stance 3x 30 seconds bilateral      Knee/Hip Exercises: Seated   Long Arc Quad Both;10 reps    Long Arc Quad Limitations 5 second    Other Seated Knee/Hip Exercises hip abd iso with belt 10x 10 second holds    Sit to [employer] 1 set;10 reps                    PT Education - 06/29/21 0914     Education Details HEP    Person(s) Educated Patient    Methods Explanation    Comprehension Verbalized understanding              PT Short Term Goals - 06/29/21 0943       PT SHORT TERM GOAL #1   Title Patient will be independent with HEP in order to improve functional outcomes.    Time 3    Period Weeks    Status On-going    Target Date 06/22/21      PT SHORT TERM GOAL #2   Title Patient will report at least 25% improvement in symptoms for improved quality of life.    Time 3    Period Weeks    Status On-going    Target Date 06/22/21               PT Long Term Goals - 06/29/21 0943       PT LONG TERM GOAL #1   Title Patient will report at least 75% improvement in symptoms for improved quality of life.     Time 6    Period Weeks    Status On-going      PT LONG TERM GOAL #2   Title Patient will be able to complete 5x STS in under 11.4 seconds in order to reduce the risk of falls.    Time 6    Period Weeks    Status On-going      PT LONG TERM GOAL #3   Title Patient will be able to ambulate at least 400 feet in 2MWT in order to demonstrate improved gait speed for community ambulation.    Time 6    Period Weeks    Status On-going      PT LONG TERM GOAL #4   Title Patient will score at least 19/24 on DGI in order to demonstrate decreased fall risk.    Time 6    Period Weeks    Status On-going                   Plan - 06/29/21 0913     Clinical Impression Statement Patient begins with standing strengthening and balance exercises today. Patient requires frequent cueing for counting reps as he tends to continue completing. Patient with min/mod sway with static balance requiring frequent HHA. Patient with greater difficulty/unsteadiness with step up using RLE secondary to weakness. Requires frequent cueing for using RLE due to tendency to use LLE for eccentric phase. Patient educated on probable muscle soreness following exercise. Patient moderately fatigued at end of session. Patient will continue to benefit from skilled physical therapy in order to reduce impairment and improve function.    Personal Factors and Comorbidities Age;Fitness;Past/Current Experience;Comorbidity 3+;Time since onset of injury/illness/exacerbation    Comorbidities hx cancer, hx R hip fracture, falls    Examination-Activity Limitations Locomotion Level;Transfers;Stand;Stairs;Squat;Lift;Carry    Examination-Participation Restrictions Meal Prep;Cleaning;Community Activity;Shop;Volunteer;Yard Work    [employer] Stable/Uncomplicated    Rehab  Potential Good    PT Frequency 2x / week    PT Duration 6 weeks    PT Treatment/Interventions ADLs/Self Care Home Management;Aquatic  Therapy;Electrical Stimulation;Iontophoresis 4mg /ml Dexamethasone;Moist Heat;Traction;DME Instruction;Gait training;Stair training;Functional mobility training;Therapeutic activities;Therapeutic exercise;Balance training;Neuromuscular re-education;Cognitive remediation;Patient/family education;Orthotic Fit/Training;Manual techniques;Compression bandaging;Passive range of motion;Dry needling;Energy conservation;Splinting;Taping    PT Next Visit Plan continue functional strength training, balance and gait training, evenually work on standing<> floor transfers    PT Home Exercise Plan STS, standing marching    Consulted and Agree with Plan of Care Patient;Family member/caregiver    Family Member Consulted wife             Patient will benefit from skilled therapeutic intervention in order to improve the following deficits and impairments:  Abnormal gait, Difficulty walking, Decreased endurance, Decreased activity tolerance, Decreased balance, Impaired flexibility, Improper body mechanics, Decreased strength, Decreased mobility  Visit Diagnosis: Other abnormalities of gait and mobility  Muscle weakness (generalized)  Other symptoms and signs involving the musculoskeletal system     Problem List Patient Active Problem List   Diagnosis Date Noted   Malignant neoplasm of overlapping sites of bladder ([place]) 04/12/2021   Bladder tumor 12/07/2020   Gross hematuria 12/07/2020   Elevated LFTs 09/29/2020   Dysphagia 09/29/2020   A-fib ([place]) 09/22/2020   Palliative care by specialist    DNR (do not resuscitate) discussion    Acute systolic CHF (congestive heart failure) ([place])    Restless leg syndrome 09/17/2020   Elevated troponin I level 09/17/2020   Loss of weight 06/09/2020   Early satiety 06/09/2020   Closed displaced fracture of right femoral neck with delayed healing 04/29/2020   Closed displaced fracture of right femoral neck ([place]) 04/29/2020   Acute respiratory failure with hypoxia  ([place]) 04/06/2020   Acute and chronic respiratory failure with hypoxia ([place]) 04/05/2020   Chronic obstructive pulmonary disease/emphysema    Anaphylactic reaction due to adverse effect of correct drug or medicament properly administered, initial encounter 07/30/2019   Hypocalcemia 05/31/2019   Other disorders of phosphorus metabolism 05/31/2019   Pancytopenia ([place]) 05/22/2019   ESRD (end stage renal disease) on dialysis ([place]) 05/22/2019   Unspecified protein-calorie malnutrition ([place]) 05/02/2019   Coagulation defect, unspecified ([place]) 04/26/2019   Diarrhea, unspecified 04/26/2019   Hypokalemia 04/26/2019   Pain, unspecified 04/26/2019   Pruritus, unspecified 04/26/2019   Secondary hyperparathyroidism of renal origin ([place]) 04/26/2019   Encounter for immunization 04/26/2019   Incisional hernia without obstruction or gangrene 04/25/2019   Malignant neoplasm of unspecified part of unspecified bronchus or lung ([place]) 04/25/2019   Other specified degenerative diseases of nervous system ([place]) 04/25/2019   Anemia in chronic kidney disease 04/25/2019   Atherosclerotic heart disease of native coronary artery without angina pectoris 04/25/2019   Gastro-esophageal reflux disease without esophagitis 04/25/2019   Hypothyroidism, unspecified 04/25/2019   Acute renal failure superimposed on stage 3 chronic kidney disease ([place]) 04/16/2019   Anemia in chronic kidney disease (CKD) 04/14/2019   Small cell lung cancer ([place]) 02/06/2019   Encounter for antineoplastic chemotherapy 02/06/2019   Goals of care, counseling/discussion 02/06/2019   Special screening for malignant neoplasms, colon    GERD (gastroesophageal reflux disease)    Hyperlipidemia    Gout    Hypothyroidism    Degenerative joint disease (DJD) of lumbar spine    Acquired hypothyroidism 02/02/2010   HLD (hyperlipidemia) 02/02/2010   Essential hypertension 02/02/2010   GERD 02/02/2010   ABDOMINAL AORTIC ANEURYSM REPAIR, HX OF 02/02/2010  9:54 AM, 06/29/21 Mearl Latin PT, DPT Physical Therapist at [place], [place], [address] Phone: [phone]   Fax:  [phone]  Name: Jeremy Johnson MRN: [id number] Date of Birth: 1946-06-28

## 2021-06-30 DIAGNOSIS — Z992 Dependence on renal dialysis: Secondary | ICD-10-CM | POA: Diagnosis not present

## 2021-06-30 DIAGNOSIS — N2581 Secondary hyperparathyroidism of renal origin: Secondary | ICD-10-CM | POA: Diagnosis not present

## 2021-06-30 DIAGNOSIS — N186 End stage renal disease: Secondary | ICD-10-CM | POA: Diagnosis not present

## 2021-06-30 DIAGNOSIS — D689 Coagulation defect, unspecified: Secondary | ICD-10-CM | POA: Diagnosis not present

## 2021-07-02 DIAGNOSIS — J449 Chronic obstructive pulmonary disease, unspecified: Secondary | ICD-10-CM | POA: Diagnosis not present

## 2021-07-02 DIAGNOSIS — Z992 Dependence on renal dialysis: Secondary | ICD-10-CM | POA: Diagnosis not present

## 2021-07-02 DIAGNOSIS — N186 End stage renal disease: Secondary | ICD-10-CM | POA: Diagnosis not present

## 2021-07-02 DIAGNOSIS — N2581 Secondary hyperparathyroidism of renal origin: Secondary | ICD-10-CM | POA: Diagnosis not present

## 2021-07-02 DIAGNOSIS — D689 Coagulation defect, unspecified: Secondary | ICD-10-CM | POA: Diagnosis not present

## 2021-07-04 ENCOUNTER — Ambulatory Visit (HOSPITAL_COMMUNITY): Payer: Medicare Other

## 2021-07-04 ENCOUNTER — Other Ambulatory Visit: Payer: Self-pay

## 2021-07-04 DIAGNOSIS — R2689 Other abnormalities of gait and mobility: Secondary | ICD-10-CM | POA: Diagnosis not present

## 2021-07-04 DIAGNOSIS — R29898 Other symptoms and signs involving the musculoskeletal system: Secondary | ICD-10-CM

## 2021-07-04 DIAGNOSIS — M6281 Muscle weakness (generalized): Secondary | ICD-10-CM | POA: Diagnosis not present

## 2021-07-04 NOTE — Therapy (Signed)
[place], [place], [address] Phone: [phone]   Fax:  [phone]  Physical Therapy Treatment  Patient Details  Name: Jeremy Johnson MRN: [id number] Date of Birth: 13-Aug-1946 Referring Provider (PT): [name] MD   Encounter Date: 07/04/2021   PT End of Session - 07/04/21 1037     Visit Number 3    Number of Visits 12    Date for PT Re-Evaluation 07/13/21    Authorization Type Primary [employer] medicare (no vl, no auth), Secondary [employer] (vl 30, no auth)    Progress Note Due on Visit 10    PT Start Time 1005    PT Stop Time 1049    PT Time Calculation (min) 44 min    Activity Tolerance Patient tolerated treatment well    Behavior During Therapy WFL for tasks assessed/performed             Past Medical History:  Diagnosis Date   Anemia    Blood transfusion without reported diagnosis    CAD (coronary artery disease)    STENT... MID CIRCUMFLEX...1997   Chronic kidney disease    STAGE 3   COPD (chronic obstructive pulmonary disease) (HCC)    Degenerative joint disease (DJD) of lumbar spine    GERD (gastroesophageal reflux disease)    Gout    Hyperlipidemia    Hypertension    Hypothyroidism    Incisional hernia    abdomen   Leukocytosis    CHRONIC MILD   Myocardial infarction ([place])    1997   SCL CA dx'd 01/2019   Lung cancer    Past Surgical History:  Procedure Laterality Date   ABDOMINAL AORTIC ANEURYSM REPAIR  2006   AV FISTULA PLACEMENT Left 04/25/2019   Procedure: ARTERIOVENOUS (AV) FISTULA CREATION LEFT ARM;  Surgeon: Angelia Mould, MD;  Location: [facility];  Service: Vascular;  Laterality: Left;   AV FISTULA PLACEMENT Left 05/19/2019   Procedure: CONVERSION OF LEFT ARM ARTERIOVENOUS FISTULA TO GRAFT;  Surgeon: Angelia Mould, MD;  Location: [facility];  Service: Vascular;  Laterality: Left;   BIOPSY  09/30/2018   Procedure: BIOPSY;  Surgeon: Danie Binder,  MD;  [facility];  Service: Endoscopy;;  ascending colon   BIOPSY  07/27/2020   Procedure: BIOPSY;  Surgeon: Eloise Harman, DO;  [facility];  Service: Endoscopy;;  gastric   BUBBLE STUDY  09/23/2020   Procedure: BUBBLE STUDY;  Surgeon: Skeet Latch, MD;  Location: [facility];  Service: Cardiovascular;;   CARDIOVERSION N/A 09/23/2020   Procedure: CARDIOVERSION;  Surgeon: Skeet Latch, MD;  Location: [facility];  Service: Cardiovascular;  Laterality: N/A;   COLONOSCOPY  2008   COLONOSCOPY N/A 09/30/2018   External and internal hemorrhoids, six polyps removed, one ascending colon polypoid lesion biopsied. Six simple adenomas and one benign polypoid lesion. Colonoscopy Nov 2022.    COLONOSCOPY WITH PROPOFOL N/A 07/27/2020   non-bleeding internal hemorrhoids, sigmoid and descending colon diverticulosis, four 1-2 mm polyps in ascending colon, one 5 mm polyp in transverse colon. 3 year surveillance. Tubular adenomas.    CORONARY ANGIOPLASTY WITH STENT PLACEMENT  1997   MID CIRCUMFLEX   CORONARY STENT INTERVENTION N/A 09/20/2020   Procedure: CORONARY STENT INTERVENTION;  Surgeon: Martinique, Peter M, MD;  Location: [facility];  Service: Cardiovascular;  Laterality: N/A;   CYSTOSCOPY W/ RETROGRADES Bilateral 03/28/2021   Procedure: CYSTOSCOPY WITH RETROGRADE PYELOGRAM;  Surgeon: Nicolette Bang  L, MD;  [facility]: [facility];  Service: Urology;  Laterality: Bilateral;   ESOPHAGOGASTRODUODENOSCOPY (EGD) WITH PROPOFOL N/A 07/27/2020   Food in middle third of esophagus, gastritis s/p biopsy, nodular mucosa in lesser curvature of stomach s/p biopsy. Negative H.pylori.    HIP ARTHROPLASTY Right 04/30/2020   Procedure: ARTHROPLASTY  HIP (HEMIARTHROPLASTY);  Surgeon: Altamese Advance, MD;  Location: [facility];  Service: Orthopedics;  Laterality: Right;   INTRAVASCULAR ULTRASOUND/IVUS N/A 09/20/2020   Procedure: Intravascular Ultrasound/IVUS;  Surgeon: Martinique, Peter M, MD;   Location: [facility];  Service: Cardiovascular;  Laterality: N/A;   IR FLUORO GUIDE CV LINE RIGHT  04/22/2019   IR FLUORO GUIDE CV LINE RIGHT  05/01/2020   IR THORACENTESIS ASP PLEURAL SPACE W/IMG GUIDE  09/22/2020   IR THROMBECTOMY AV FISTULA W/THROMBOLYSIS/PTA INC/SHUNT/IMG LEFT Left 05/03/2020   IR US GUIDE VASC ACCESS LEFT  05/03/2020   IR US GUIDE VASC ACCESS RIGHT  04/22/2019   IR US GUIDE VASC ACCESS RIGHT  05/01/2020   POLYPECTOMY  09/30/2018   Procedure: POLYPECTOMY;  Surgeon: Danie Binder, MD;  [facility];  Service: Endoscopy;;  colon   POLYPECTOMY  07/27/2020   Procedure: POLYPECTOMY;  Surgeon: Eloise Harman, DO;  [facility];  Service: Endoscopy;;   RIGHT/LEFT HEART CATH AND CORONARY ANGIOGRAPHY N/A 09/20/2020   Procedure: RIGHT/LEFT HEART CATH AND CORONARY ANGIOGRAPHY;  Surgeon: Martinique, Peter M, MD;  Location: [facility];  Service: Cardiovascular;  Laterality: N/A;   TEE WITHOUT CARDIOVERSION N/A 09/23/2020   Procedure: TRANSESOPHAGEAL ECHOCARDIOGRAM (TEE);  Surgeon: Skeet Latch, MD;  Location: [facility];  Service: Cardiovascular;  Laterality: N/A;   TRANSURETHRAL RESECTION OF BLADDER TUMOR N/A 03/28/2021   Procedure: TRANSURETHRAL RESECTION OF BLADDER TUMOR (TURBT);  Surgeon: Cleon Gustin, MD;  [facility];  Service: Urology;  Laterality: N/A;   VIDEO BRONCHOSCOPY WITH ENDOBRONCHIAL NAVIGATION N/A 01/27/2019   Procedure: VIDEO BRONCHOSCOPY WITH ENDOBRONCHIAL NAVIGATION;  Surgeon: Grace Isaac, MD;  Location: [facility];  Service: Thoracic;  Laterality: N/A;   VIDEO BRONCHOSCOPY WITH ENDOBRONCHIAL ULTRASOUND N/A 01/27/2019   Procedure: VIDEO BRONCHOSCOPY WITH ENDOBRONCHIAL ULTRASOUND;  Surgeon: Grace Isaac, MD;  Location: [facility];  Service: Thoracic;  Laterality: N/A;    There were no vitals filed for this visit.     [facility] Adult PT Treatment/Exercise - 07/04/21 0001       Knee/Hip Exercises: Standing   Heel  Raises Both;2 sets;10 reps    Heel Raises Limitations + toe raises 2 set of 10    Knee Flexion Both;2 sets;10 reps    Knee Flexion Limitations HHA for technique for glut/ham activation    Hip Flexion Stengthening;Both;2 sets;10 reps;Knee bent    Hip Flexion Limitations alternating march    Hip Abduction Both;2 sets;10 reps    Hip Extension Stengthening;Both;2 sets;10 reps;Knee straight    Lateral Step Up Both;2 sets;10 reps;Hand Hold: 1;Step Height: 6"    Forward Step Up Both;2 sets;10 reps;Step Height: 6";Hand Hold: 2    Other Standing Knee Exercises Tandem stance 3x 30 seconds bilateral      Knee/Hip Exercises: Seated   Sit to Sand 10 reps;2 sets              PT Education - 07/04/21 1036     Education Details Discussed purpose and technique of interventions throughout session. Advanced HEP.    Person(s) Educated Patient    Methods Explanation;Handout    Comprehension Verbalized understanding  PT Short Term Goals - 06/29/21 0943       PT SHORT TERM GOAL #1   Title Patient will be independent with HEP in order to improve functional outcomes.    Time 3    Period Weeks    Status On-going    Target Date 06/22/21      PT SHORT TERM GOAL #2   Title Patient will report at least 25% improvement in symptoms for improved quality of life.    Time 3    Period Weeks    Status On-going    Target Date 06/22/21               PT Long Term Goals - 06/29/21 0943       PT LONG TERM GOAL #1   Title Patient will report at least 75% improvement in symptoms for improved quality of life.    Time 6    Period Weeks    Status On-going      PT LONG TERM GOAL #2   Title Patient will be able to complete 5x STS in under 11.4 seconds in order to reduce the risk of falls.    Time 6    Period Weeks    Status On-going      PT LONG TERM GOAL #3   Title Patient will be able to ambulate at least 400 feet in 2MWT in order to demonstrate improved gait speed for community  ambulation.    Time 6    Period Weeks    Status On-going      PT LONG TERM GOAL #4   Title Patient will score at least 19/24 on DGI in order to demonstrate decreased fall risk.    Time 6    Period Weeks    Status On-going                   Plan - 07/04/21 1022     Clinical Impression Statement Session focused on lower extremity strengthening. Patient required tactile and verbal cues for correct performance of knee flexion, to facilitate glut and hamstring, and forward and lateral step ups. Added hip extension to therapeutic exercises. Added standing heel raise, toe raise, hip abduction and extension to HEP. Patient will continue to benefit from skilled physical therapy in order to reduce impairment and improve function.    Personal Factors and Comorbidities Age;Fitness;Past/Current Experience;Comorbidity 3+;Time since onset of injury/illness/exacerbation    Comorbidities hx cancer, hx R hip fracture, falls    Examination-Activity Limitations Locomotion Level;Transfers;Stand;Stairs;Squat;Lift;Carry    Examination-Participation Restrictions Meal Prep;Cleaning;Community Activity;Shop;Volunteer;Yard Work    Stability/Clinical Decision Making Stable/Uncomplicated    Rehab Potential Good    PT Frequency 2x / week    PT Duration 6 weeks    PT Treatment/Interventions ADLs/Self Care Home Management;Aquatic Therapy;Electrical Stimulation;Iontophoresis 4mg /ml Dexamethasone;Moist Heat;Traction;DME Instruction;Gait training;Stair training;Functional mobility training;Therapeutic activities;Therapeutic exercise;Balance training;Neuromuscular re-education;Cognitive remediation;Patient/family education;Orthotic Fit/Training;Manual techniques;Compression bandaging;Passive range of motion;Dry needling;Energy conservation;Splinting;Taping    PT Next Visit Plan continue functional strength training, balance and gait training, evenually work on standing<> floor transfers; add tandem stance balance to  HEP next session    PT Home Exercise Plan STS, standing marching; 8/22 - heel/toe raise, hip ext/abd    Consulted and Agree with Plan of Care Patient;Family member/caregiver    Family Member Consulted wife             Patient will benefit from skilled therapeutic intervention in order to improve the following deficits and impairments:  Abnormal gait, Difficulty  walking, Decreased endurance, Decreased activity tolerance, Decreased balance, Impaired flexibility, Improper body mechanics, Decreased strength, Decreased mobility  Visit Diagnosis: Other abnormalities of gait and mobility  Muscle weakness (generalized)  Other symptoms and signs involving the musculoskeletal system     Problem List Patient Active Problem List   Diagnosis Date Noted   Malignant neoplasm of overlapping sites of bladder ([place]) 04/12/2021   Bladder tumor 12/07/2020   Gross hematuria 12/07/2020   Elevated LFTs 09/29/2020   Dysphagia 09/29/2020   A-fib ([place]) 09/22/2020   Palliative care by specialist    DNR (do not resuscitate) discussion    Acute systolic CHF (congestive heart failure) ([place])    Restless leg syndrome 09/17/2020   Elevated troponin I level 09/17/2020   Loss of weight 06/09/2020   Early satiety 06/09/2020   Closed displaced fracture of right femoral neck with delayed healing 04/29/2020   Closed displaced fracture of right femoral neck ([place]) 04/29/2020   Acute respiratory failure with hypoxia ([place]) 04/06/2020   Acute and chronic respiratory failure with hypoxia ([place]) 04/05/2020   Chronic obstructive pulmonary disease/emphysema    Anaphylactic reaction due to adverse effect of correct drug or medicament properly administered, initial encounter 07/30/2019   Hypocalcemia 05/31/2019   Other disorders of phosphorus metabolism 05/31/2019   Pancytopenia ([place]) 05/22/2019   ESRD (end stage renal disease) on dialysis ([place]) 05/22/2019   Unspecified protein-calorie malnutrition ([place]) 05/02/2019    Coagulation defect, unspecified ([place]) 04/26/2019   Diarrhea, unspecified 04/26/2019   Hypokalemia 04/26/2019   Pain, unspecified 04/26/2019   Pruritus, unspecified 04/26/2019   Secondary hyperparathyroidism of renal origin ([place]) 04/26/2019   Encounter for immunization 04/26/2019   Incisional hernia without obstruction or gangrene 04/25/2019   Malignant neoplasm of unspecified part of unspecified bronchus or lung ([place]) 04/25/2019   Other specified degenerative diseases of nervous system ([place]) 04/25/2019   Anemia in chronic kidney disease 04/25/2019   Atherosclerotic heart disease of native coronary artery without angina pectoris 04/25/2019   Gastro-esophageal reflux disease without esophagitis 04/25/2019   Hypothyroidism, unspecified 04/25/2019   Acute renal failure superimposed on stage 3 chronic kidney disease ([place]) 04/16/2019   Anemia in chronic kidney disease (CKD) 04/14/2019   Small cell lung cancer ([place]) 02/06/2019   Encounter for antineoplastic chemotherapy 02/06/2019   Goals of care, counseling/discussion 02/06/2019   Special screening for malignant neoplasms, colon    GERD (gastroesophageal reflux disease)    Hyperlipidemia    Gout    Hypothyroidism    Degenerative joint disease (DJD) of lumbar spine    Acquired hypothyroidism 02/02/2010   HLD (hyperlipidemia) 02/02/2010   Essential hypertension 02/02/2010   GERD 02/02/2010   ABDOMINAL AORTIC ANEURYSM REPAIR, HX OF 02/02/2010   Floria Raveling. Hartnett-Rands, MS, PT [facility] [phone]  Jeannie Done 07/04/2021, 10:48 AM  [facility] [address] [place], [place], [address] Phone: [phone]   Fax:  [phone]  Name: Jeremy Johnson MRN: [id number] Date of Birth: Mar 16, 1946

## 2021-07-04 NOTE — Patient Instructions (Addendum)
Toe-Up (Ankle Plantar Flexion and Dorsiflexion)    Holding a stable object, rise up on toes. Hold _1-2_ seconds. Then rock back on heels and Hold _1-2_ seconds. Repeat 10-20__ times. Do _1_ sessions per day.  http://gt2.exer.us/409   Copyright  VHI. All rights reserved.   Hip Extension (Standing)    Stand with support. Move right leg backward with straight knee. Hold for _1-2 seconds. Repeat 10-20_ times. Do 1_ times a day. Repeat with other leg. When ready add 1-3_ lb weight.   Copyright  VHI. All rights reserved.   HIP: Abduction - Standing    Squeeze glutes. Raise leg out and slightly back. 10-20__ reps per set, _1 sets per day. Hold onto a support.  Copyright  VHI. All rights reserved.

## 2021-07-05 DIAGNOSIS — N2581 Secondary hyperparathyroidism of renal origin: Secondary | ICD-10-CM | POA: Diagnosis not present

## 2021-07-05 DIAGNOSIS — D689 Coagulation defect, unspecified: Secondary | ICD-10-CM | POA: Diagnosis not present

## 2021-07-05 DIAGNOSIS — N186 End stage renal disease: Secondary | ICD-10-CM | POA: Diagnosis not present

## 2021-07-05 DIAGNOSIS — Z992 Dependence on renal dialysis: Secondary | ICD-10-CM | POA: Diagnosis not present

## 2021-07-06 ENCOUNTER — Ambulatory Visit (HOSPITAL_COMMUNITY): Payer: Medicare Other | Admitting: Physical Therapy

## 2021-07-06 ENCOUNTER — Encounter (HOSPITAL_COMMUNITY): Payer: Self-pay | Admitting: Physical Therapy

## 2021-07-06 ENCOUNTER — Other Ambulatory Visit: Payer: Self-pay

## 2021-07-06 DIAGNOSIS — R2689 Other abnormalities of gait and mobility: Secondary | ICD-10-CM

## 2021-07-06 DIAGNOSIS — R29898 Other symptoms and signs involving the musculoskeletal system: Secondary | ICD-10-CM

## 2021-07-06 DIAGNOSIS — S72001D Fracture of unspecified part of neck of right femur, subsequent encounter for closed fracture with routine healing: Secondary | ICD-10-CM | POA: Diagnosis not present

## 2021-07-06 DIAGNOSIS — M6281 Muscle weakness (generalized): Secondary | ICD-10-CM

## 2021-07-06 NOTE — Patient Instructions (Signed)
Access Code: [email or web address] URL: [email or web address] Date: 07/06/2021 Prepared by: Mitzi Hansen Kensli Bowley  Exercises Standing Tandem Balance with Counter Support - 1 x daily - 7 x weekly - 3 reps - 30 second hold

## 2021-07-06 NOTE — Therapy (Signed)
[place], [place], [address] Phone: [phone]   Fax:  [phone]  Physical Therapy Treatment  Patient Details  Name: Jeremy Johnson MRN: [id number] Date of Birth: 03-14-1946 Referring Provider (PT): [name] MD   Encounter Date: 07/06/2021   PT End of Session - 07/06/21 1003     Visit Number 4    Number of Visits 12    Date for PT Re-Evaluation 07/13/21    Authorization Type Primary [employer] medicare (no vl, no auth), Secondary [employer] (vl 30, no auth)    Progress Note Due on Visit 10    PT Start Time 1003    PT Stop Time 1041    PT Time Calculation (min) 38 min    Activity Tolerance Patient tolerated treatment well    Behavior During Therapy WFL for tasks assessed/performed             Past Medical History:  Diagnosis Date   Anemia    Blood transfusion without reported diagnosis    CAD (coronary artery disease)    STENT... MID CIRCUMFLEX...1997   Chronic kidney disease    STAGE 3   COPD (chronic obstructive pulmonary disease) (HCC)    Degenerative joint disease (DJD) of lumbar spine    GERD (gastroesophageal reflux disease)    Gout    Hyperlipidemia    Hypertension    Hypothyroidism    Incisional hernia    abdomen   Leukocytosis    CHRONIC MILD   Myocardial infarction ([place])    1997   SCL CA dx'd 01/2019   Lung cancer    Past Surgical History:  Procedure Laterality Date   ABDOMINAL AORTIC ANEURYSM REPAIR  2006   AV FISTULA PLACEMENT Left 04/25/2019   Procedure: ARTERIOVENOUS (AV) FISTULA CREATION LEFT ARM;  Surgeon: Angelia Mould, MD;  Location: [place];  Service: Vascular;  Laterality: Left;   AV FISTULA PLACEMENT Left 05/19/2019   Procedure: CONVERSION OF LEFT ARM ARTERIOVENOUS FISTULA TO GRAFT;  Surgeon: Angelia Mould, MD;  Location: [place];  Service: Vascular;  Laterality: Left;   BIOPSY  09/30/2018   Procedure: BIOPSY;  Surgeon: Danie Binder,  MD;  [facility];  Service: Endoscopy;;  ascending colon   BIOPSY  07/27/2020   Procedure: BIOPSY;  Surgeon: Eloise Harman, DO;  [facility];  Service: Endoscopy;;  gastric   BUBBLE STUDY  09/23/2020   Procedure: BUBBLE STUDY;  Surgeon: Skeet Latch, MD;  Location: [place];  Service: Cardiovascular;;   CARDIOVERSION N/A 09/23/2020   Procedure: CARDIOVERSION;  Surgeon: Skeet Latch, MD;  Location: [place];  Service: Cardiovascular;  Laterality: N/A;   COLONOSCOPY  2008   COLONOSCOPY N/A 09/30/2018   External and internal hemorrhoids, six polyps removed, one ascending colon polypoid lesion biopsied. Six simple adenomas and one benign polypoid lesion. Colonoscopy Nov 2022.    COLONOSCOPY WITH PROPOFOL N/A 07/27/2020   non-bleeding internal hemorrhoids, sigmoid and descending colon diverticulosis, four 1-2 mm polyps in ascending colon, one 5 mm polyp in transverse colon. 3 year surveillance. Tubular adenomas.    CORONARY ANGIOPLASTY WITH STENT PLACEMENT  1997   MID CIRCUMFLEX   CORONARY STENT INTERVENTION N/A 09/20/2020   Procedure: CORONARY STENT INTERVENTION;  Surgeon: Martinique, Peter M, MD;  Location: [facility];  Service: Cardiovascular;  Laterality: N/A;   CYSTOSCOPY W/ RETROGRADES Bilateral 03/28/2021   Procedure: CYSTOSCOPY WITH RETROGRADE PYELOGRAM;  Surgeon: Nicolette Bang  L, MD;  [facility]: [facility];  Service: Urology;  Laterality: Bilateral;   ESOPHAGOGASTRODUODENOSCOPY (EGD) WITH PROPOFOL N/A 07/27/2020   Food in middle third of esophagus, gastritis s/p biopsy, nodular mucosa in lesser curvature of stomach s/p biopsy. Negative H.pylori.    HIP ARTHROPLASTY Right 04/30/2020   Procedure: ARTHROPLASTY  HIP (HEMIARTHROPLASTY);  Surgeon: Altamese Scotia, MD;  Location: [facility];  Service: Orthopedics;  Laterality: Right;   INTRAVASCULAR ULTRASOUND/IVUS N/A 09/20/2020   Procedure: Intravascular Ultrasound/IVUS;  Surgeon: Martinique, Peter M, MD;   Location: [facility];  Service: Cardiovascular;  Laterality: N/A;   IR FLUORO GUIDE CV LINE RIGHT  04/22/2019   IR FLUORO GUIDE CV LINE RIGHT  05/01/2020   IR THORACENTESIS ASP PLEURAL SPACE W/IMG GUIDE  09/22/2020   IR THROMBECTOMY AV FISTULA W/THROMBOLYSIS/PTA INC/SHUNT/IMG LEFT Left 05/03/2020   IR US GUIDE VASC ACCESS LEFT  05/03/2020   IR US GUIDE VASC ACCESS RIGHT  04/22/2019   IR US GUIDE VASC ACCESS RIGHT  05/01/2020   POLYPECTOMY  09/30/2018   Procedure: POLYPECTOMY;  Surgeon: Danie Binder, MD;  [facility];  Service: Endoscopy;;  colon   POLYPECTOMY  07/27/2020   Procedure: POLYPECTOMY;  Surgeon: Eloise Harman, DO;  [facility];  Service: Endoscopy;;   RIGHT/LEFT HEART CATH AND CORONARY ANGIOGRAPHY N/A 09/20/2020   Procedure: RIGHT/LEFT HEART CATH AND CORONARY ANGIOGRAPHY;  Surgeon: Martinique, Peter M, MD;  Location: [facility];  Service: Cardiovascular;  Laterality: N/A;   TEE WITHOUT CARDIOVERSION N/A 09/23/2020   Procedure: TRANSESOPHAGEAL ECHOCARDIOGRAM (TEE);  Surgeon: Skeet Latch, MD;  Location: [facility];  Service: Cardiovascular;  Laterality: N/A;   TRANSURETHRAL RESECTION OF BLADDER TUMOR N/A 03/28/2021   Procedure: TRANSURETHRAL RESECTION OF BLADDER TUMOR (TURBT);  Surgeon: Cleon Gustin, MD;  [facility];  Service: Urology;  Laterality: N/A;   VIDEO BRONCHOSCOPY WITH ENDOBRONCHIAL NAVIGATION N/A 01/27/2019   Procedure: VIDEO BRONCHOSCOPY WITH ENDOBRONCHIAL NAVIGATION;  Surgeon: Grace Isaac, MD;  Location: [facility];  Service: Thoracic;  Laterality: N/A;   VIDEO BRONCHOSCOPY WITH ENDOBRONCHIAL ULTRASOUND N/A 01/27/2019   Procedure: VIDEO BRONCHOSCOPY WITH ENDOBRONCHIAL ULTRASOUND;  Surgeon: Grace Isaac, MD;  Location: [facility];  Service: Thoracic;  Laterality: N/A;    There were no vitals filed for this visit.   Subjective Assessment - 07/06/21 1003     Subjective Patient states he doesn't know if things will  help. He fell the other day and had trouble getting up.    Patient is accompained by: Family member    Pertinent History R hip fracture last year, bladder cancer and treatment    Limitations Standing;Walking;House hold activities    How long can you walk comfortably? 15-20 minutes treadmill flat with UE support    Patient Stated Goals improve balance and strength    Currently in Pain? No/denies                               [facility] Adult PT Treatment/Exercise - 07/06/21 0001       Knee/Hip Exercises: Standing   Heel Raises Both;1 set;20 reps    Heel Raises Limitations TR 1x 20    Knee Flexion Both;2 sets;10 reps    Knee Flexion Limitations 2#    Hip Flexion Stengthening;Both;2 sets;10 reps;Knee bent    Hip Flexion Limitations alternating march    Forward Lunges Both;10 reps;2 sets    Hip Abduction Both;2  sets;10 reps    Abduction Limitations 2#    Forward Step Up Both;2 sets;10 reps;Hand Hold: 2;Step Height: 8"    Other Standing Knee Exercises Tandem stance 3x 30 seconds bilateral    Other Standing Knee Exercises lateral stepping 6x 10 feet bilateral                    PT Education - 07/06/21 1003     Education Details HEP    Person(s) Educated Patient    Methods Explanation    Comprehension Verbalized understanding              PT Short Term Goals - 06/29/21 0943       PT SHORT TERM GOAL #1   Title Patient will be independent with HEP in order to improve functional outcomes.    Time 3    Period Weeks    Status On-going    Target Date 06/22/21      PT SHORT TERM GOAL #2   Title Patient will report at least 25% improvement in symptoms for improved quality of life.    Time 3    Period Weeks    Status On-going    Target Date 06/22/21               PT Long Term Goals - 06/29/21 0943       PT LONG TERM GOAL #1   Title Patient will report at least 75% improvement in symptoms for improved quality of life.    Time 6    Period  Weeks    Status On-going      PT LONG TERM GOAL #2   Title Patient will be able to complete 5x STS in under 11.4 seconds in order to reduce the risk of falls.    Time 6    Period Weeks    Status On-going      PT LONG TERM GOAL #3   Title Patient will be able to ambulate at least 400 feet in 2MWT in order to demonstrate improved gait speed for community ambulation.    Time 6    Period Weeks    Status On-going      PT LONG TERM GOAL #4   Title Patient will score at least 19/24 on DGI in order to demonstrate decreased fall risk.    Time 6    Period Weeks    Status On-going                   Plan - 07/06/21 1003     Clinical Impression Statement Patient educated on likely requiring more time to notice change in strength and balance as he only has attended several sessions and has been sick along with comorbidities. Patient tolerates addition of weights for LE exercises without c/o increased fatigue. Patient began lunges today for LE strengthening to initiate ability to transfer to/from floor and he requires bilateral UE support for balance and strength deficits. Patient unsteady with static balance requiring frequent UE support. Patient will continue to benefit from skilled physical therapy in order to reduce impairment and improve function.    Personal Factors and Comorbidities Age;Fitness;Past/Current Experience;Comorbidity 3+;Time since onset of injury/illness/exacerbation    Comorbidities hx cancer, hx R hip fracture, falls    Examination-Activity Limitations Locomotion Level;Transfers;Stand;Stairs;Squat;Lift;Carry    Examination-Participation Restrictions Meal Prep;Cleaning;Community Activity;Shop;Volunteer;Yard Work    Stability/Clinical Decision Making Stable/Uncomplicated    Rehab Potential Good    PT Frequency 2x / week  PT Duration 6 weeks    PT Treatment/Interventions ADLs/Self Care Home Management;Aquatic Therapy;Electrical Stimulation;Iontophoresis 4mg /ml  Dexamethasone;Moist Heat;Traction;DME Instruction;Gait training;Stair training;Functional mobility training;Therapeutic activities;Therapeutic exercise;Balance training;Neuromuscular re-education;Cognitive remediation;Patient/family education;Orthotic Fit/Training;Manual techniques;Compression bandaging;Passive range of motion;Dry needling;Energy conservation;Splinting;Taping    PT Next Visit Plan continue functional strength training, balance and gait training, evenually work on standing<> floor transfers    PT Home Exercise Plan STS, standing marching; 8/22 - heel/toe raise, hip ext/abd 8/24 tandem stance    Consulted and Agree with Plan of Care Patient;Family member/caregiver    Family Member Consulted wife             Patient will benefit from skilled therapeutic intervention in order to improve the following deficits and impairments:  Abnormal gait, Difficulty walking, Decreased endurance, Decreased activity tolerance, Decreased balance, Impaired flexibility, Improper body mechanics, Decreased strength, Decreased mobility  Visit Diagnosis: Other abnormalities of gait and mobility  Muscle weakness (generalized)  Other symptoms and signs involving the musculoskeletal system     Problem List Patient Active Problem List   Diagnosis Date Noted   Malignant neoplasm of overlapping sites of bladder ([place]) 04/12/2021   Bladder tumor 12/07/2020   Gross hematuria 12/07/2020   Elevated LFTs 09/29/2020   Dysphagia 09/29/2020   A-fib ([place]) 09/22/2020   Palliative care by specialist    DNR (do not resuscitate) discussion    Acute systolic CHF (congestive heart failure) ([place])    Restless leg syndrome 09/17/2020   Elevated troponin I level 09/17/2020   Loss of weight 06/09/2020   Early satiety 06/09/2020   Closed displaced fracture of right femoral neck with delayed healing 04/29/2020   Closed displaced fracture of right femoral neck ([place]) 04/29/2020   Acute respiratory failure with  hypoxia ([place]) 04/06/2020   Acute and chronic respiratory failure with hypoxia ([place]) 04/05/2020   Chronic obstructive pulmonary disease/emphysema    Anaphylactic reaction due to adverse effect of correct drug or medicament properly administered, initial encounter 07/30/2019   Hypocalcemia 05/31/2019   Other disorders of phosphorus metabolism 05/31/2019   Pancytopenia ([place]) 05/22/2019   ESRD (end stage renal disease) on dialysis ([place]) 05/22/2019   Unspecified protein-calorie malnutrition ([place]) 05/02/2019   Coagulation defect, unspecified ([place]) 04/26/2019   Diarrhea, unspecified 04/26/2019   Hypokalemia 04/26/2019   Pain, unspecified 04/26/2019   Pruritus, unspecified 04/26/2019   Secondary hyperparathyroidism of renal origin ([place]) 04/26/2019   Encounter for immunization 04/26/2019   Incisional hernia without obstruction or gangrene 04/25/2019   Malignant neoplasm of unspecified part of unspecified bronchus or lung ([place]) 04/25/2019   Other specified degenerative diseases of nervous system ([place]) 04/25/2019   Anemia in chronic kidney disease 04/25/2019   Atherosclerotic heart disease of native coronary artery without angina pectoris 04/25/2019   Gastro-esophageal reflux disease without esophagitis 04/25/2019   Hypothyroidism, unspecified 04/25/2019   Acute renal failure superimposed on stage 3 chronic kidney disease ([place]) 04/16/2019   Anemia in chronic kidney disease (CKD) 04/14/2019   Small cell lung cancer ([place]) 02/06/2019   Encounter for antineoplastic chemotherapy 02/06/2019   Goals of care, counseling/discussion 02/06/2019   Special screening for malignant neoplasms, colon    GERD (gastroesophageal reflux disease)    Hyperlipidemia    Gout    Hypothyroidism    Degenerative joint disease (DJD) of lumbar spine    Acquired hypothyroidism 02/02/2010   HLD (hyperlipidemia) 02/02/2010   Essential hypertension 02/02/2010   GERD 02/02/2010   ABDOMINAL AORTIC ANEURYSM REPAIR, HX OF  02/02/2010    10:38 AM, 07/06/21 Mitzi Hansen  Juanita Laster PT, DPT Physical Therapist at [place], [place], [address] Phone: [phone]   Fax:  [phone]  Name: Jeremy Johnson MRN: [id number] Date of Birth: 05-02-1946

## 2021-07-07 DIAGNOSIS — Z992 Dependence on renal dialysis: Secondary | ICD-10-CM | POA: Diagnosis not present

## 2021-07-07 DIAGNOSIS — D689 Coagulation defect, unspecified: Secondary | ICD-10-CM | POA: Diagnosis not present

## 2021-07-07 DIAGNOSIS — N2581 Secondary hyperparathyroidism of renal origin: Secondary | ICD-10-CM | POA: Diagnosis not present

## 2021-07-07 DIAGNOSIS — N186 End stage renal disease: Secondary | ICD-10-CM | POA: Diagnosis not present

## 2021-07-08 ENCOUNTER — Ambulatory Visit (INDEPENDENT_AMBULATORY_CARE_PROVIDER_SITE_OTHER): Payer: Medicare Other | Admitting: Cardiology

## 2021-07-08 ENCOUNTER — Encounter: Payer: Self-pay | Admitting: Cardiology

## 2021-07-08 ENCOUNTER — Other Ambulatory Visit: Payer: Self-pay

## 2021-07-08 VITALS — BP 148/80 | HR 70 | Ht 69.0 in | Wt 162.6 lb

## 2021-07-08 DIAGNOSIS — I4892 Unspecified atrial flutter: Secondary | ICD-10-CM

## 2021-07-08 DIAGNOSIS — I2510 Atherosclerotic heart disease of native coronary artery without angina pectoris: Secondary | ICD-10-CM | POA: Diagnosis not present

## 2021-07-08 DIAGNOSIS — I5022 Chronic systolic (congestive) heart failure: Secondary | ICD-10-CM | POA: Diagnosis not present

## 2021-07-08 NOTE — Patient Instructions (Signed)
Medication Instructions:  Your physician recommends that you continue on your current medications as directed. Please refer to the Current Medication list given to you today.  *If you need a refill on your cardiac medications before your next appointment, please call your pharmacy*   Lab Work: None If you have labs (blood work) drawn today and your tests are completely normal, you will receive your results only by: [facility] (if you have MyChart) OR A paper copy in the mail If you have any lab test that is abnormal or we need to change your treatment, we will call you to review the results.   Testing/Procedures: None   Follow-Up: At [facility], you and your health needs are our priority.  As part of our continuing mission to provide you with exceptional heart care, we have created designated Provider Care Teams.  These Care Teams include your primary Cardiologist (physician) and Advanced Practice Providers (APPs -  Physician Assistants and Nurse Practitioners) who all work together to provide you with the care you need, when you need it.  We recommend signing up for the patient portal called "MyChart".  Sign up information is provided on this After Visit Summary.  MyChart is used to connect with patients for Virtual Visits (Telemedicine).  Patients are able to view lab/test results, encounter notes, upcoming appointments, etc.  Non-urgent messages can be sent to your provider as well.   To learn more about what you can do with MyChart, go to [email or web address].    Your next appointment:   4 month(s)  The format for your next appointment:   In Person  Provider:   Carlyle Dolly, MD   Other Instructions

## 2021-07-08 NOTE — Progress Notes (Signed)
Clinical Summary Jeremy Johnson is a 75 y.o.male seen today for follow up of the following medical problems.    1. CAD - prior stent to LCX in 1997 - 09/2020 DES x2 to LCX. Had some hemorroidal bleeing on triple therapy, plan for eliquis and plavix for one year   -no recent chest pain - no SOB/DOE - compliant with meds  3. Falls - occurring pretty regularly - denies any dizziness, no presyncope or syncope - has started working with PT for what sounds like mechanical falls   4. Chronic systolic HF - new diagnosis during 09/2020 admission - 09/2020 echo LVEF 15-20%, grade III dd, mild RV dysfunction -12/2020 echo LVEF 25-30%, normal RV function   - low bp's on HD had limited medical therapy. Nephrology increasing dry weight but ongoing issue   - he is hesitant for ICD consideration, favors ongoing medical therapy.    -no edema, no SOB/DOE   5. ESRD - low bp's on HD has been a chronic issue - neprhology has started midodrine   6. Aflutter - s/p TEE/DCCV during 09/2020 admission - started on amio   - he is on eliquis - has had some prior hematuria in the past due to bladder cancer.  - no recent palpitations.   7. Thoracic aneurysm 4.2cm thoracic aortic aneurysm at the arch, 3.9cm at the diaphragm and 3cm at the infrarenal aorta - Penetrating atherosclerotic ulcer noted along the undersurface of the aortic arch adjacent to the area of maximal dilation. This demonstrates interval decrease in size with increasing mural thrombus when compared to prior examination.  - followed by CT surgery  01/2021 CT stable 4.5 cm aneurysm    4. Lung cancer - followed by oncology - has completed chemo     5. Bladder tumor - followed by urology     Past Medical History:  Diagnosis Date   Anemia    Blood transfusion without reported diagnosis    CAD (coronary artery disease)    STENT... MID CIRCUMFLEX...1997   Chronic kidney disease    STAGE 3   COPD (chronic obstructive  pulmonary disease) (HCC)    Degenerative joint disease (DJD) of lumbar spine    GERD (gastroesophageal reflux disease)    Gout    Hyperlipidemia    Hypertension    Hypothyroidism    Incisional hernia    abdomen   Leukocytosis    CHRONIC MILD   Myocardial infarction ([place])    1997   SCL CA dx'd 01/2019   Lung cancer     Allergies  Allergen Reactions   Advair Hfa [Fluticasone-Salmeterol] Other (See Comments)    Developed thrush, although the mouth WAS being rinsed as directed   Penicillins Rash    Has patient had a PCN reaction causing immediate rash, facial/tongue/throat swelling, SOB or lightheadedness with hypotension: No Has patient had a PCN reaction causing severe rash involving mucus membranes or skin necrosis: No Has patient had a PCN reaction that required hospitalization: No Has patient had a PCN reaction occurring within the last 10 years: No If all of the above answers are "NO", then may proceed with Cephalosporin use.      Current Outpatient Medications  Medication Sig Dispense Refill   acetaminophen (TYLENOL) 325 MG tablet Take 2 tablets (650 mg total) by mouth every 6 (six) hours as needed for mild pain (or Fever >/= 101). (Patient taking differently: Take 1,300 mg by mouth every 6 (six) hours as needed for mild pain  or headache (or Fever >/= 101). Arthritis strength) 30 tablet 0   allopurinol (ZYLOPRIM) 100 MG tablet Take 1 tablet (100 mg total) by mouth daily. (Patient taking differently: Take 100 mg by mouth in the morning.) 30 tablet 1   amiodarone (PACERONE) 200 MG tablet Take 1 tablet (200 mg total) by mouth daily. 10 tablet 3   atorvastatin (LIPITOR) 40 MG tablet TAKE 1 TABLET BY MOUTH  DAILY (Patient taking differently: Take 40 mg by mouth daily.) 60 tablet 5   B Complex-C-Zn-Folic Acid (DIALYVITE 177-LTJQ 15) 0.8 MG TABS Take 1 tablet by mouth daily.     cephALEXin (KEFLEX) 500 MG capsule Take 1 capsule (500 mg total) by mouth 3 (three) times daily. 21  capsule 0   clopidogrel (PLAVIX) 75 MG tablet TAKE 1 TABLET BY MOUTH  DAILY WITH BREAKFAST (Patient taking differently: Take 75 mg by mouth daily with breakfast.) 60 tablet 5   ELIQUIS 2.5 MG TABS tablet TAKE 1 TABLET BY MOUTH  TWICE DAILY (Patient taking differently: Take 2.5 mg by mouth 2 (two) times daily.) 180 tablet 3   HYDROcodone-acetaminophen (NORCO/VICODIN) 5-325 MG tablet Take 1 tablet by mouth every 4 (four) hours as needed for moderate pain. 15 tablet 0   ipratropium (ATROVENT) 0.06 % nasal spray Place 2 sprays into both nostrils daily as needed for rhinitis.     levocetirizine (XYZAL) 5 MG tablet Take 5 mg by mouth at bedtime.     levothyroxine (SYNTHROID, LEVOTHROID) 175 MCG tablet Take 175 mcg by mouth daily before breakfast.      lidocaine-prilocaine (EMLA) cream Apply 1 application topically Every Tuesday,Thursday,and Saturday with dialysis.     metoprolol succinate (TOPROL-XL) 25 MG 24 hr tablet Take 0.5 tablets (12.5 mg total) by mouth daily. 45 tablet 3   omeprazole (PRILOSEC) 40 MG capsule Take 1 capsule (40 mg total) by mouth in the morning and at bedtime. (Patient taking differently: Take 40 mg by mouth daily.) 60 capsule 5   sevelamer carbonate (RENVELA) 800 MG tablet Take 800-1,600 mg by mouth 3 (three) times daily with meals.     No current facility-administered medications for this visit.     Past Surgical History:  Procedure Laterality Date   ABDOMINAL AORTIC ANEURYSM REPAIR  2006   AV FISTULA PLACEMENT Left 04/25/2019   Procedure: ARTERIOVENOUS (AV) FISTULA CREATION LEFT ARM;  Surgeon: Angelia Mould, MD;  Location: [facility];  Service: Vascular;  Laterality: Left;   AV FISTULA PLACEMENT Left 05/19/2019   Procedure: CONVERSION OF LEFT ARM ARTERIOVENOUS FISTULA TO GRAFT;  Surgeon: Angelia Mould, MD;  Location: [place];  Service: Vascular;  Laterality: Left;   BIOPSY  09/30/2018   Procedure: BIOPSY;  Surgeon: Danie Binder, MD;  [facility];   Service: Endoscopy;;  ascending colon   BIOPSY  07/27/2020   Procedure: BIOPSY;  Surgeon: Eloise Harman, DO;  [facility];  Service: Endoscopy;;  gastric   BUBBLE STUDY  09/23/2020   Procedure: BUBBLE STUDY;  Surgeon: Skeet Latch, MD;  Location: [place];  Service: Cardiovascular;;   CARDIOVERSION N/A 09/23/2020   Procedure: CARDIOVERSION;  Surgeon: Skeet Latch, MD;  Location: [place];  Service: Cardiovascular;  Laterality: N/A;   COLONOSCOPY  2008   COLONOSCOPY N/A 09/30/2018   External and internal hemorrhoids, six polyps removed, one ascending colon polypoid lesion biopsied. Six simple adenomas and one benign polypoid lesion. Colonoscopy Nov 2022.    COLONOSCOPY WITH PROPOFOL N/A 07/27/2020   non-bleeding  internal hemorrhoids, sigmoid and descending colon diverticulosis, four 1-2 mm polyps in ascending colon, one 5 mm polyp in transverse colon. 3 year surveillance. Tubular adenomas.    CORONARY ANGIOPLASTY WITH STENT PLACEMENT  1997   MID CIRCUMFLEX   CORONARY STENT INTERVENTION N/A 09/20/2020   Procedure: CORONARY STENT INTERVENTION;  Surgeon: Martinique, Peter M, MD;  Location: [facility];  Service: Cardiovascular;  Laterality: N/A;   CYSTOSCOPY W/ RETROGRADES Bilateral 03/28/2021   Procedure: CYSTOSCOPY WITH RETROGRADE PYELOGRAM;  Surgeon: Cleon Gustin, MD;  Location: [facility];  Service: Urology;  Laterality: Bilateral;   ESOPHAGOGASTRODUODENOSCOPY (EGD) WITH PROPOFOL N/A 07/27/2020   Food in middle third of esophagus, gastritis s/p biopsy, nodular mucosa in lesser curvature of stomach s/p biopsy. Negative H.pylori.    HIP ARTHROPLASTY Right 04/30/2020   Procedure: ARTHROPLASTY  HIP (HEMIARTHROPLASTY);  Surgeon: Altamese Mosier, MD;  Location: [place];  Service: Orthopedics;  Laterality: Right;   INTRAVASCULAR ULTRASOUND/IVUS N/A 09/20/2020   Procedure: Intravascular Ultrasound/IVUS;  Surgeon: Martinique, Peter M, MD;  Location: [place];   Service: Cardiovascular;  Laterality: N/A;   IR FLUORO GUIDE CV LINE RIGHT  04/22/2019   IR FLUORO GUIDE CV LINE RIGHT  05/01/2020   IR THORACENTESIS ASP PLEURAL SPACE W/IMG GUIDE  09/22/2020   IR THROMBECTOMY AV FISTULA W/THROMBOLYSIS/PTA INC/SHUNT/IMG LEFT Left 05/03/2020   IR US GUIDE VASC ACCESS LEFT  05/03/2020   IR US GUIDE VASC ACCESS RIGHT  04/22/2019   IR US GUIDE VASC ACCESS RIGHT  05/01/2020   POLYPECTOMY  09/30/2018   Procedure: POLYPECTOMY;  Surgeon: Danie Binder, MD;  [facility];  Service: Endoscopy;;  colon   POLYPECTOMY  07/27/2020   Procedure: POLYPECTOMY;  Surgeon: Eloise Harman, DO;  [facility];  Service: Endoscopy;;   RIGHT/LEFT HEART CATH AND CORONARY ANGIOGRAPHY N/A 09/20/2020   Procedure: RIGHT/LEFT HEART CATH AND CORONARY ANGIOGRAPHY;  Surgeon: Martinique, Peter M, MD;  Location: [place] [facility];  Service: Cardiovascular;  Laterality: N/A;   TEE WITHOUT CARDIOVERSION N/A 09/23/2020   Procedure: TRANSESOPHAGEAL ECHOCARDIOGRAM (TEE);  Surgeon: Skeet Latch, MD;  Location: [place];  Service: Cardiovascular;  Laterality: N/A;   TRANSURETHRAL RESECTION OF BLADDER TUMOR N/A 03/28/2021   Procedure: TRANSURETHRAL RESECTION OF BLADDER TUMOR (TURBT);  Surgeon: Cleon Gustin, MD;  Location: [facility];  Service: Urology;  Laterality: N/A;   VIDEO BRONCHOSCOPY WITH ENDOBRONCHIAL NAVIGATION N/A 01/27/2019   Procedure: VIDEO BRONCHOSCOPY WITH ENDOBRONCHIAL NAVIGATION;  Surgeon: Grace Isaac, MD;  Location: [place];  Service: Thoracic;  Laterality: N/A;   VIDEO BRONCHOSCOPY WITH ENDOBRONCHIAL ULTRASOUND N/A 01/27/2019   Procedure: VIDEO BRONCHOSCOPY WITH ENDOBRONCHIAL ULTRASOUND;  Surgeon: Grace Isaac, MD;  Location: [place];  Service: Thoracic;  Laterality: N/A;     Allergies  Allergen Reactions   Advair Hfa [Fluticasone-Salmeterol] Other (See Comments)    Developed thrush, although the mouth WAS being rinsed as directed   Penicillins  Rash    Has patient had a PCN reaction causing immediate rash, facial/tongue/throat swelling, SOB or lightheadedness with hypotension: No Has patient had a PCN reaction causing severe rash involving mucus membranes or skin necrosis: No Has patient had a PCN reaction that required hospitalization: No Has patient had a PCN reaction occurring within the last 10 years: No If all of the above answers are "NO", then may proceed with Cephalosporin use.       Family History  Problem Relation Age of Onset   Stroke Brother  Lung cancer Sister 64       lung cancer/former   Colon cancer Neg Hx    Colon polyps Neg Hx      Social History Jeremy Johnson reports that he quit smoking about 3 years ago. He has a 27.00 pack-year smoking history. He has never used smokeless tobacco. Jeremy Johnson reports that he does not currently use alcohol.   Review of Systems CONSTITUTIONAL: No weight loss, fever, chills, weakness or fatigue.  HEENT: Eyes: No visual loss, blurred vision, double vision or yellow sclerae.No hearing loss, sneezing, congestion, runny nose or sore throat.  SKIN: No rash or itching.  CARDIOVASCULAR: per hpi RESPIRATORY: No shortness of breath, cough or sputum.  GASTROINTESTINAL: No anorexia, nausea, vomiting or diarrhea. No abdominal pain or blood.  GENITOURINARY: No burning on urination, no polyuria NEUROLOGICAL: No headache, dizziness, syncope, paralysis, ataxia, numbness or tingling in the extremities. No change in bowel or bladder control.  MUSCULOSKELETAL: No muscle, back pain, joint pain or stiffness.  LYMPHATICS: No enlarged nodes. No history of splenectomy.  PSYCHIATRIC: No history of depression or anxiety.  ENDOCRINOLOGIC: No reports of sweating, cold or heat intolerance. No polyuria or polydipsia.  [name]   Physical Examination Today's Vitals   07/08/21 1313  BP: (!) 148/80  Pulse: 70  SpO2: 98%  Weight: 162 lb 9.6 oz (73.8 kg)  Height: 5\' 9"  (1.753 m)   Body mass index  is 24.01 kg/m.  Gen: resting comfortably, no acute distress HEENT: no scleral icterus, pupils equal round and reactive, no palptable cervical adenopathy,  CV: RRR, no m/r/g no jvd Resp: Clear to auscultation bilaterally GI: abdomen is soft, non-tender, non-distended, normal bowel sounds, no hepatosplenomegaly MSK: extremities are warm, no edema.  Skin: warm, no rash Neuro:  no focal deficits Psych: appropriate affect   Diagnostic Studies [facility] 09/20/2020 Prox LAD to Mid LAD lesion is 40% stenosed. Mid Cx lesion is 50% stenosed. Mid Cx to Dist Cx lesion is 90% stenosed. Prox Cx lesion is 50% stenosed. RV Wilber Fini lesion is 50% stenosed. A drug-eluting stent was successfully placed using a STENT RESOLUTE ONYX 3.0X34. Post intervention, there is a 0% residual stenosis. Post intervention, there is a 0% residual stenosis. Post intervention, there is a 0% residual stenosis. A drug-eluting stent was successfully placed using a STENT RESOLUTE ONYX 4.0X18. LV end diastolic pressure is normal. Hemodynamic findings consistent with mild pulmonary hypertension.   1. Left dominant circulation 2. Severe single vessel obstructive CAD involving the proximal to mid LCx 3. Normal LV filling pressures 4. Mild pulmonary HTN 5. Normal cardiac output 6. Successful PCI of the proximal to mid LCx with DES x 2 overlapping. Lesion modified with cutting balloon angioplasty and Shockwave therapy.    Plan: DAPT for at least one year. Optimize medical therapy for CHF.    TTE 09/17/2020    1. Left ventricular ejection fraction, by estimation, is 15-20%. The left  ventricle has severely decreased function. The left ventricle demonstrates  global hypokinesis. The left ventricular internal cavity size was mildly  dilated. Left ventricular  diastolic parameters are consistent with Grade III diastolic dysfunction  (restrictive). Elevated left atrial pressure.   2. Right ventricular systolic function is mildly  reduced. The right  ventricular size is normal.   3. Left atrial size was severely dilated.   4. Right atrial size was severely dilated.   5. Large pleural effusion in the left lateral region.   6. The mitral valve is normal in structure. Mild mitral  valve  regurgitation. No evidence of mitral stenosis.   7. The aortic valve is tricuspid. Aortic valve regurgitation is not  visualized. No aortic stenosis is present.   8. The inferior vena cava is dilated in size with >50% respiratory  variability, suggesting right atrial pressure of 8 mmHg.     Assessment and Plan  1. CAD - no recent symptoms - can stop plavix after 09/2021 marking one year since his stents   2. Chronic systolic HF - euvolemic, volume status controlled with HD - on max tolerated medical therapy due to low bp's with HD - no symptoms, continue current meds   3. Aflutter - no symptoms, continue current meds - essentially normal live panel 05/2021, at f/u see if pcp has recent TSH      Arnoldo Lenis, M.D.

## 2021-07-09 DIAGNOSIS — D689 Coagulation defect, unspecified: Secondary | ICD-10-CM | POA: Diagnosis not present

## 2021-07-09 DIAGNOSIS — Z992 Dependence on renal dialysis: Secondary | ICD-10-CM | POA: Diagnosis not present

## 2021-07-09 DIAGNOSIS — N2581 Secondary hyperparathyroidism of renal origin: Secondary | ICD-10-CM | POA: Diagnosis not present

## 2021-07-09 DIAGNOSIS — N186 End stage renal disease: Secondary | ICD-10-CM | POA: Diagnosis not present

## 2021-07-11 ENCOUNTER — Other Ambulatory Visit (HOSPITAL_COMMUNITY): Payer: Self-pay | Admitting: Internal Medicine

## 2021-07-11 ENCOUNTER — Ambulatory Visit (HOSPITAL_COMMUNITY): Payer: Medicare Other | Admitting: Physical Therapy

## 2021-07-11 ENCOUNTER — Other Ambulatory Visit: Payer: Self-pay

## 2021-07-11 ENCOUNTER — Ambulatory Visit (HOSPITAL_COMMUNITY)
Admission: RE | Admit: 2021-07-11 | Discharge: 2021-07-11 | Disposition: A | Discharge status: Home / Self Care | Payer: Medicare Other | Source: Ambulatory Visit | Attending: Internal Medicine | Admitting: Internal Medicine

## 2021-07-11 DIAGNOSIS — S299XXA Unspecified injury of thorax, initial encounter: Secondary | ICD-10-CM | POA: Insufficient documentation

## 2021-07-11 DIAGNOSIS — J9811 Atelectasis: Secondary | ICD-10-CM | POA: Diagnosis not present

## 2021-07-11 DIAGNOSIS — S2239XB Fracture of one rib, unspecified side, initial encounter for open fracture: Secondary | ICD-10-CM | POA: Diagnosis not present

## 2021-07-11 DIAGNOSIS — J984 Other disorders of lung: Secondary | ICD-10-CM | POA: Diagnosis not present

## 2021-07-11 DIAGNOSIS — J90 Pleural effusion, not elsewhere classified: Secondary | ICD-10-CM | POA: Diagnosis not present

## 2021-07-12 DIAGNOSIS — D689 Coagulation defect, unspecified: Secondary | ICD-10-CM | POA: Diagnosis not present

## 2021-07-12 DIAGNOSIS — Z992 Dependence on renal dialysis: Secondary | ICD-10-CM | POA: Diagnosis not present

## 2021-07-12 DIAGNOSIS — N2581 Secondary hyperparathyroidism of renal origin: Secondary | ICD-10-CM | POA: Diagnosis not present

## 2021-07-12 DIAGNOSIS — N186 End stage renal disease: Secondary | ICD-10-CM | POA: Diagnosis not present

## 2021-07-13 ENCOUNTER — Other Ambulatory Visit: Payer: Self-pay

## 2021-07-13 ENCOUNTER — Encounter (HOSPITAL_COMMUNITY): Payer: Self-pay | Admitting: Physical Therapy

## 2021-07-13 ENCOUNTER — Ambulatory Visit (HOSPITAL_COMMUNITY): Payer: Medicare Other | Admitting: Physical Therapy

## 2021-07-13 DIAGNOSIS — R29898 Other symptoms and signs involving the musculoskeletal system: Secondary | ICD-10-CM | POA: Diagnosis not present

## 2021-07-13 DIAGNOSIS — N186 End stage renal disease: Secondary | ICD-10-CM | POA: Diagnosis not present

## 2021-07-13 DIAGNOSIS — M6281 Muscle weakness (generalized): Secondary | ICD-10-CM

## 2021-07-13 DIAGNOSIS — Z992 Dependence on renal dialysis: Secondary | ICD-10-CM | POA: Diagnosis not present

## 2021-07-13 DIAGNOSIS — I129 Hypertensive chronic kidney disease with stage 1 through stage 4 chronic kidney disease, or unspecified chronic kidney disease: Secondary | ICD-10-CM | POA: Diagnosis not present

## 2021-07-13 DIAGNOSIS — R2689 Other abnormalities of gait and mobility: Secondary | ICD-10-CM

## 2021-07-13 NOTE — Therapy (Signed)
[place], [place], [address] Phone: [phone]   Fax:  [phone]  Physical Therapy Treatment/Progress Note/Recert  Patient Details  Name: Jeremy Johnson MRN: [id number] Date of Birth: Apr 04, 1946 Referring Provider (PT): [name] MD   Encounter Date: 07/13/2021  Progress Note   Reporting Period 06/01/21 to 07/13/21   See note below for Objective Data and Assessment of Progress/Goals    PT End of Session - 07/13/21 0910     Visit Number 5    Number of Visits 20    Date for PT Re-Evaluation 08/10/21    Authorization Type Primary [employer] medicare (no vl, no auth), Secondary [employer] (vl 30, no auth)    Progress Note Due on Visit 15    PT Start Time 0912    PT Stop Time 0950    PT Time Calculation (min) 38 min    Activity Tolerance Patient tolerated treatment well    Behavior During Therapy [facility] for tasks assessed/performed             Past Medical History:  Diagnosis Date   Anemia    Blood transfusion without reported diagnosis    CAD (coronary artery disease)    STENT... MID CIRCUMFLEX...1997   Chronic kidney disease    STAGE 3   COPD (chronic obstructive pulmonary disease) (HCC)    Degenerative joint disease (DJD) of lumbar spine    GERD (gastroesophageal reflux disease)    Gout    Hyperlipidemia    Hypertension    Hypothyroidism    Incisional hernia    abdomen   Leukocytosis    CHRONIC MILD   Myocardial infarction ([place])    1997   SCL CA dx'd 01/2019   Lung cancer    Past Surgical History:  Procedure Laterality Date   ABDOMINAL AORTIC ANEURYSM REPAIR  2006   AV FISTULA PLACEMENT Left 04/25/2019   Procedure: ARTERIOVENOUS (AV) FISTULA CREATION LEFT ARM;  Surgeon: Angelia Mould, MD;  Location: [place];  Service: Vascular;  Laterality: Left;   AV FISTULA PLACEMENT Left 05/19/2019   Procedure: CONVERSION OF LEFT ARM ARTERIOVENOUS FISTULA TO GRAFT;  Surgeon:  Angelia Mould, MD;  Location: [place];  Service: Vascular;  Laterality: Left;   BIOPSY  09/30/2018   Procedure: BIOPSY;  Surgeon: Danie Binder, MD;  [facility];  Service: Endoscopy;;  ascending colon   BIOPSY  07/27/2020   Procedure: BIOPSY;  Surgeon: Eloise Harman, DO;  [facility];  Service: Endoscopy;;  gastric   BUBBLE STUDY  09/23/2020   Procedure: BUBBLE STUDY;  Surgeon: Skeet Latch, MD;  Location: [place];  Service: Cardiovascular;;   CARDIOVERSION N/A 09/23/2020   Procedure: CARDIOVERSION;  Surgeon: Skeet Latch, MD;  Location: [place];  Service: Cardiovascular;  Laterality: N/A;   COLONOSCOPY  2008   COLONOSCOPY N/A 09/30/2018   External and internal hemorrhoids, six polyps removed, one ascending colon polypoid lesion biopsied. Six simple adenomas and one benign polypoid lesion. Colonoscopy Nov 2022.    COLONOSCOPY WITH PROPOFOL N/A 07/27/2020   non-bleeding internal hemorrhoids, sigmoid and descending colon diverticulosis, four 1-2 mm polyps in ascending colon, one 5 mm polyp in transverse colon. 3 year surveillance. Tubular adenomas.    CORONARY ANGIOPLASTY WITH STENT PLACEMENT  1997   MID CIRCUMFLEX   CORONARY STENT INTERVENTION N/A 09/20/2020   Procedure: CORONARY STENT INTERVENTION;  Surgeon: Martinique, Peter M, MD;  Location: [place] [facility];  Service: Cardiovascular;  Laterality: N/A;   CYSTOSCOPY W/ RETROGRADES Bilateral 03/28/2021   Procedure: CYSTOSCOPY WITH RETROGRADE PYELOGRAM;  Surgeon: Cleon Gustin, MD;  [facility]: [facility];  Service: Urology;  Laterality: Bilateral;   ESOPHAGOGASTRODUODENOSCOPY (EGD) WITH PROPOFOL N/A 07/27/2020   Food in middle third of esophagus, gastritis s/p biopsy, nodular mucosa in lesser curvature of stomach s/p biopsy. Negative H.pylori.    HIP ARTHROPLASTY Right 04/30/2020   Procedure: ARTHROPLASTY  HIP (HEMIARTHROPLASTY);  Surgeon: Altamese Gilbert, MD;  Location: [place];  Service:  Orthopedics;  Laterality: Right;   INTRAVASCULAR ULTRASOUND/IVUS N/A 09/20/2020   Procedure: Intravascular Ultrasound/IVUS;  Surgeon: Martinique, Peter M, MD;  Location: [place] [facility];  Service: Cardiovascular;  Laterality: N/A;   IR FLUORO GUIDE CV LINE RIGHT  04/22/2019   IR FLUORO GUIDE CV LINE RIGHT  05/01/2020   IR THORACENTESIS ASP PLEURAL SPACE W/IMG GUIDE  09/22/2020   IR THROMBECTOMY AV FISTULA W/THROMBOLYSIS/PTA INC/SHUNT/IMG LEFT Left 05/03/2020   IR US GUIDE VASC ACCESS LEFT  05/03/2020   IR US GUIDE VASC ACCESS RIGHT  04/22/2019   IR US GUIDE VASC ACCESS RIGHT  05/01/2020   POLYPECTOMY  09/30/2018   Procedure: POLYPECTOMY;  Surgeon: Danie Binder, MD;  [facility];  Service: Endoscopy;;  colon   POLYPECTOMY  07/27/2020   Procedure: POLYPECTOMY;  Surgeon: Eloise Harman, DO;  [facility];  Service: Endoscopy;;   RIGHT/LEFT HEART CATH AND CORONARY ANGIOGRAPHY N/A 09/20/2020   Procedure: RIGHT/LEFT HEART CATH AND CORONARY ANGIOGRAPHY;  Surgeon: Martinique, Peter M, MD;  Location: [place] [facility];  Service: Cardiovascular;  Laterality: N/A;   TEE WITHOUT CARDIOVERSION N/A 09/23/2020   Procedure: TRANSESOPHAGEAL ECHOCARDIOGRAM (TEE);  Surgeon: Skeet Latch, MD;  Location: [place];  Service: Cardiovascular;  Laterality: N/A;   TRANSURETHRAL RESECTION OF BLADDER TUMOR N/A 03/28/2021   Procedure: TRANSURETHRAL RESECTION OF BLADDER TUMOR (TURBT);  Surgeon: Cleon Gustin, MD;  [facility];  Service: Urology;  Laterality: N/A;   VIDEO BRONCHOSCOPY WITH ENDOBRONCHIAL NAVIGATION N/A 01/27/2019   Procedure: VIDEO BRONCHOSCOPY WITH ENDOBRONCHIAL NAVIGATION;  Surgeon: Grace Isaac, MD;  Location: [place];  Service: Thoracic;  Laterality: N/A;   VIDEO BRONCHOSCOPY WITH ENDOBRONCHIAL ULTRASOUND N/A 01/27/2019   Procedure: VIDEO BRONCHOSCOPY WITH ENDOBRONCHIAL ULTRASOUND;  Surgeon: Grace Isaac, MD;  Location: [place];  Service: Thoracic;  Laterality: N/A;     There were no vitals filed for this visit.   Subjective Assessment - 07/13/21 0911     Subjective Patient continues to be off balance. He hasn't done HEP for last week because he has been sore. He states 3-4% improvement with PT intervention. He is unsteady especially when waking up.    Patient is accompained by: Family member    Pertinent History R hip fracture last year, bladder cancer and treatment    Limitations Standing;Walking;House hold activities    How long can you walk comfortably? 15-20 minutes treadmill flat with UE support    Patient Stated Goals improve balance and strength    Currently in Pain? Yes    Pain Score 5     Pain Location Back    Pain Orientation Lower    Pain Descriptors / Indicators Sore    Pain Type Acute pain    Pain Onset In the past 7 days    Pain Frequency Intermittent                [facility] PT Assessment - 07/13/21 0001  Assessment   Medical Diagnosis Falls    Referring Provider (PT) [name] MD    Onset Date/Surgical Date 12/02/20    Next MD Visit December    Prior Therapy HHPT for hip      Precautions   Precautions Fall      Restrictions   Weight Bearing Restrictions No      Balance Screen   Has the patient fallen in the past 6 months Yes    How many times? 3    Has the patient had a decrease in activity level because of a fear of falling?  Yes    Is the patient reluctant to leave their home because of a fear of falling?  No      Prior Function   Level of Independence Independent      Cognition   Overall Cognitive Status Within Functional Limits for tasks assessed      Observation/Other Assessments   Observations Ambulates with SPC      Strength   Right Hip Flexion 4+/5    Left Hip Flexion 4+/5    Right Knee Flexion 5/5    Right Knee Extension 5/5    Left Knee Flexion 5/5    Left Knee Extension 5/5    Right Ankle Dorsiflexion 5/5    Left Ankle Dorsiflexion 5/5      Transfers   Five time sit to stand  comments  13.38 seconds without UE support      Ambulation/Gait   Ambulation/Gait Yes    Ambulation/Gait Assistance 6: Modified independent (Device/Increase time)    Ambulation Distance (Feet) 350 Feet    Assistive device None    Gait Pattern Step-through pattern    Ambulation Surface Level;Indoor    Gait velocity decreased    Gait Comments 2MWT      Dynamic Gait Index   Level Surface Mild Impairment    Change in Gait Speed Mild Impairment    Gait with Horizontal Head Turns Mild Impairment    Gait with Vertical Head Turns Mild Impairment    Gait and Pivot Turn Mild Impairment    Step Over Obstacle Mild Impairment    Step Around Obstacles Mild Impairment    Steps Mild Impairment    Total Score 16                           [facility] Adult PT Treatment/Exercise - 07/13/21 0001       Knee/Hip Exercises: Standing   Forward Step Up Both;2 sets;10 reps;Step Height: 8";Hand Hold: 1    Forward Step Up Limitations contralateral knee drive    SLS with Vectors 5x5 second holds bilateral                    PT Education - 07/13/21 0911     Education Details HEP, reassessment findings, POC    Person(s) Educated Patient    Methods Explanation    Comprehension Verbalized understanding              PT Short Term Goals - 06/29/21 0943       PT SHORT TERM GOAL #1   Title Patient will be independent with HEP in order to improve functional outcomes.    Time 3    Period Weeks    Status On-going    Target Date 06/22/21      PT SHORT TERM GOAL #2   Title Patient will report at least 25% improvement  in symptoms for improved quality of life.    Time 3    Period Weeks    Status On-going    Target Date 06/22/21               PT Long Term Goals - 06/29/21 0943       PT LONG TERM GOAL #1   Title Patient will report at least 75% improvement in symptoms for improved quality of life.    Time 6    Period Weeks    Status On-going      PT LONG TERM  GOAL #2   Title Patient will be able to complete 5x STS in under 11.4 seconds in order to reduce the risk of falls.    Time 6    Period Weeks    Status On-going      PT LONG TERM GOAL #3   Title Patient will be able to ambulate at least 400 feet in 2MWT in order to demonstrate improved gait speed for community ambulation.    Time 6    Period Weeks    Status On-going      PT LONG TERM GOAL #4   Title Patient will score at least 19/24 on DGI in order to demonstrate decreased fall risk.    Time 6    Period Weeks    Status On-going                   Plan - 07/13/21 0911     Clinical Impression Statement Patient has met 0/2 short term goals and 0/4 long term goals at this time but has made good progress toward goals with improvement in strength, gait, and balance. Patient continues to be limited by balance deficits. Lack of progress likely due to lack of sessions and comorbidities present. Patient able to progress to unilateral UE support with exercises today. Patient will continue to benefit from skilled physical therapy in order to reduce impairment and improve function.    Personal Factors and Comorbidities Age;Fitness;Past/Current Experience;Comorbidity 3+;Time since onset of injury/illness/exacerbation    Comorbidities hx cancer, hx R hip fracture, falls    Examination-Activity Limitations Locomotion Level;Transfers;Stand;Stairs;Squat;Lift;Carry    Examination-Participation Restrictions Meal Prep;Cleaning;Community Activity;Shop;Volunteer;Yard Work    Stability/Clinical Decision Making Stable/Uncomplicated    Rehab Potential Good    PT Frequency 2x / week    PT Duration 4 weeks    PT Treatment/Interventions ADLs/Self Care Home Management;Aquatic Therapy;Electrical Stimulation;Iontophoresis 57m/ml Dexamethasone;Moist Heat;Traction;DME Instruction;Gait training;Stair training;Functional mobility training;Therapeutic activities;Therapeutic exercise;Balance training;Neuromuscular  re-education;Cognitive remediation;Patient/family education;Orthotic Fit/Training;Manual techniques;Compression bandaging;Passive range of motion;Dry needling;Energy conservation;Splinting;Taping    PT Next Visit Plan continue functional strength training, balance and gait training, evenually work on standing<> floor transfers    PT Home Exercise Plan STS, standing marching; 8/22 - heel/toe raise, hip ext/abd 8/24 tandem stance    Consulted and Agree with Plan of Care Patient    Family Member Consulted --             Patient will benefit from skilled therapeutic intervention in order to improve the following deficits and impairments:  Abnormal gait, Difficulty walking, Decreased endurance, Decreased activity tolerance, Decreased balance, Impaired flexibility, Improper body mechanics, Decreased strength, Decreased mobility  Visit Diagnosis: Other abnormalities of gait and mobility  Muscle weakness (generalized)  Other symptoms and signs involving the musculoskeletal system     Problem List Patient Active Problem List   Diagnosis Date Noted   Malignant neoplasm of overlapping sites of bladder [place] 04/12/2021  Bladder tumor 12/07/2020   Gross hematuria 12/07/2020   Elevated LFTs 09/29/2020   Dysphagia 09/29/2020   A-fib ([place]) 09/22/2020   Palliative care by specialist    DNR (do not resuscitate) discussion    Acute systolic CHF (congestive heart failure) ([place])    Restless leg syndrome 09/17/2020   Elevated troponin I level 09/17/2020   Loss of weight 06/09/2020   Early satiety 06/09/2020   Closed displaced fracture of right femoral neck with delayed healing 04/29/2020   Closed displaced fracture of right femoral neck ([place]) 04/29/2020   Acute respiratory failure with hypoxia ([place]) 04/06/2020   Acute and chronic respiratory failure with hypoxia ([place]) 04/05/2020   Chronic obstructive pulmonary disease/emphysema    Anaphylactic reaction due to adverse effect of correct drug or  medicament properly administered, initial encounter 07/30/2019   Hypocalcemia 05/31/2019   Other disorders of phosphorus metabolism 05/31/2019   Pancytopenia ([place]) 05/22/2019   ESRD (end stage renal disease) on dialysis ([place]) 05/22/2019   Unspecified protein-calorie malnutrition ([place]) 05/02/2019   Coagulation defect, unspecified ([place]) 04/26/2019   Diarrhea, unspecified 04/26/2019   Hypokalemia 04/26/2019   Pain, unspecified 04/26/2019   Pruritus, unspecified 04/26/2019   Secondary hyperparathyroidism of renal origin ([place]) 04/26/2019   Encounter for immunization 04/26/2019   Incisional hernia without obstruction or gangrene 04/25/2019   Malignant neoplasm of unspecified part of unspecified bronchus or lung ([place]) 04/25/2019   Other specified degenerative diseases of nervous system ([place]) 04/25/2019   Anemia in chronic kidney disease 04/25/2019   Atherosclerotic heart disease of native coronary artery without angina pectoris 04/25/2019   Gastro-esophageal reflux disease without esophagitis 04/25/2019   Hypothyroidism, unspecified 04/25/2019   Acute renal failure superimposed on stage 3 chronic kidney disease ([place]) 04/16/2019   Anemia in chronic kidney disease (CKD) 04/14/2019   Small cell lung cancer ([place]) 02/06/2019   Encounter for antineoplastic chemotherapy 02/06/2019   Goals of care, counseling/discussion 02/06/2019   Special screening for malignant neoplasms, colon    GERD (gastroesophageal reflux disease)    Hyperlipidemia    Gout    Hypothyroidism    Degenerative joint disease (DJD) of lumbar spine    Acquired hypothyroidism 02/02/2010   HLD (hyperlipidemia) 02/02/2010   Essential hypertension 02/02/2010   GERD 02/02/2010   ABDOMINAL AORTIC ANEURYSM REPAIR, HX OF 02/02/2010    9:46 AM, 07/13/21 Mearl Latin PT, DPT Physical Therapist at [place], [place],  [address] Phone: [phone]   Fax:  [phone]  Name: EMRE STOCK MRN: [id number] Date of Birth: 11-18-1945

## 2021-07-14 DIAGNOSIS — N2581 Secondary hyperparathyroidism of renal origin: Secondary | ICD-10-CM | POA: Diagnosis not present

## 2021-07-14 DIAGNOSIS — R52 Pain, unspecified: Secondary | ICD-10-CM | POA: Diagnosis not present

## 2021-07-14 DIAGNOSIS — Z992 Dependence on renal dialysis: Secondary | ICD-10-CM | POA: Diagnosis not present

## 2021-07-14 DIAGNOSIS — Z23 Encounter for immunization: Secondary | ICD-10-CM | POA: Diagnosis not present

## 2021-07-14 DIAGNOSIS — D689 Coagulation defect, unspecified: Secondary | ICD-10-CM | POA: Diagnosis not present

## 2021-07-14 DIAGNOSIS — N186 End stage renal disease: Secondary | ICD-10-CM | POA: Diagnosis not present

## 2021-07-16 DIAGNOSIS — N2581 Secondary hyperparathyroidism of renal origin: Secondary | ICD-10-CM | POA: Diagnosis not present

## 2021-07-16 DIAGNOSIS — Z992 Dependence on renal dialysis: Secondary | ICD-10-CM | POA: Diagnosis not present

## 2021-07-16 DIAGNOSIS — D689 Coagulation defect, unspecified: Secondary | ICD-10-CM | POA: Diagnosis not present

## 2021-07-16 DIAGNOSIS — Z23 Encounter for immunization: Secondary | ICD-10-CM | POA: Diagnosis not present

## 2021-07-16 DIAGNOSIS — R52 Pain, unspecified: Secondary | ICD-10-CM | POA: Diagnosis not present

## 2021-07-16 DIAGNOSIS — N186 End stage renal disease: Secondary | ICD-10-CM | POA: Diagnosis not present

## 2021-07-19 ENCOUNTER — Other Ambulatory Visit: Payer: Medicare Other

## 2021-07-19 DIAGNOSIS — D689 Coagulation defect, unspecified: Secondary | ICD-10-CM | POA: Diagnosis not present

## 2021-07-19 DIAGNOSIS — N186 End stage renal disease: Secondary | ICD-10-CM | POA: Diagnosis not present

## 2021-07-19 DIAGNOSIS — N2581 Secondary hyperparathyroidism of renal origin: Secondary | ICD-10-CM | POA: Diagnosis not present

## 2021-07-19 DIAGNOSIS — Z23 Encounter for immunization: Secondary | ICD-10-CM | POA: Diagnosis not present

## 2021-07-19 DIAGNOSIS — Z992 Dependence on renal dialysis: Secondary | ICD-10-CM | POA: Diagnosis not present

## 2021-07-19 DIAGNOSIS — R52 Pain, unspecified: Secondary | ICD-10-CM | POA: Diagnosis not present

## 2021-07-20 ENCOUNTER — Other Ambulatory Visit: Payer: Self-pay

## 2021-07-20 ENCOUNTER — Ambulatory Visit (HOSPITAL_COMMUNITY)
Admission: RE | Admit: 2021-07-20 | Discharge: 2021-07-20 | Disposition: A | Discharge status: Home / Self Care | Payer: Medicare Other | Source: Ambulatory Visit | Attending: Internal Medicine | Admitting: Internal Medicine

## 2021-07-20 ENCOUNTER — Inpatient Hospital Stay: Payer: Medicare Other | Attending: Internal Medicine

## 2021-07-20 ENCOUNTER — Telehealth: Payer: Self-pay

## 2021-07-20 DIAGNOSIS — M6281 Muscle weakness (generalized): Secondary | ICD-10-CM | POA: Insufficient documentation

## 2021-07-20 DIAGNOSIS — I252 Old myocardial infarction: Secondary | ICD-10-CM | POA: Insufficient documentation

## 2021-07-20 DIAGNOSIS — I700 Atherosclerosis of aorta: Secondary | ICD-10-CM | POA: Insufficient documentation

## 2021-07-20 DIAGNOSIS — K219 Gastro-esophageal reflux disease without esophagitis: Secondary | ICD-10-CM | POA: Insufficient documentation

## 2021-07-20 DIAGNOSIS — C3490 Malignant neoplasm of unspecified part of unspecified bronchus or lung: Secondary | ICD-10-CM

## 2021-07-20 DIAGNOSIS — J439 Emphysema, unspecified: Secondary | ICD-10-CM | POA: Insufficient documentation

## 2021-07-20 DIAGNOSIS — J432 Centrilobular emphysema: Secondary | ICD-10-CM | POA: Insufficient documentation

## 2021-07-20 DIAGNOSIS — C3411 Malignant neoplasm of upper lobe, right bronchus or lung: Secondary | ICD-10-CM | POA: Insufficient documentation

## 2021-07-20 DIAGNOSIS — Z992 Dependence on renal dialysis: Secondary | ICD-10-CM | POA: Insufficient documentation

## 2021-07-20 DIAGNOSIS — Z923 Personal history of irradiation: Secondary | ICD-10-CM | POA: Insufficient documentation

## 2021-07-20 DIAGNOSIS — Z86010 Personal history of colonic polyps: Secondary | ICD-10-CM | POA: Insufficient documentation

## 2021-07-20 DIAGNOSIS — N186 End stage renal disease: Secondary | ICD-10-CM | POA: Diagnosis not present

## 2021-07-20 DIAGNOSIS — Z8719 Personal history of other diseases of the digestive system: Secondary | ICD-10-CM | POA: Diagnosis not present

## 2021-07-20 DIAGNOSIS — D61818 Other pancytopenia: Secondary | ICD-10-CM | POA: Diagnosis not present

## 2021-07-20 DIAGNOSIS — Z880 Allergy status to penicillin: Secondary | ICD-10-CM | POA: Diagnosis not present

## 2021-07-20 DIAGNOSIS — Z7901 Long term (current) use of anticoagulants: Secondary | ICD-10-CM | POA: Insufficient documentation

## 2021-07-20 DIAGNOSIS — K439 Ventral hernia without obstruction or gangrene: Secondary | ICD-10-CM | POA: Diagnosis not present

## 2021-07-20 DIAGNOSIS — Z79899 Other long term (current) drug therapy: Secondary | ICD-10-CM | POA: Insufficient documentation

## 2021-07-20 LAB — CMP (CANCER CENTER ONLY)
ALT: 14 U/L (ref 0–44)
AST: 22 U/L (ref 15–41)
Albumin: 2.6 g/dL — ABNORMAL LOW (ref 3.5–5.0)
Alkaline Phosphatase: 90 U/L (ref 38–126)
Anion gap: 13 (ref 5–15)
BUN: 22 mg/dL (ref 8–23)
CO2: 29 mmol/L (ref 22–32)
Calcium: 9.1 mg/dL (ref 8.9–10.3)
Chloride: 95 mmol/L — ABNORMAL LOW (ref 98–111)
Creatinine: 7.18 mg/dL (ref 0.61–1.24)
GFR, Estimated: 7 mL/min — ABNORMAL LOW (ref >60)
Glucose, Bld: 126 mg/dL — ABNORMAL HIGH (ref 70–99)
Potassium: 4.5 mmol/L (ref 3.5–5.1)
Sodium: 137 mmol/L (ref 135–145)
Total Bilirubin: 0.5 mg/dL (ref 0.3–1.2)
Total Protein: 7.7 g/dL (ref 6.5–8.1)

## 2021-07-20 LAB — CBC WITH DIFFERENTIAL (CANCER CENTER ONLY)
Abs Immature Granulocytes: 0.08 10*3/uL — ABNORMAL HIGH (ref 0.00–0.07)
Basophils Absolute: 0.1 10*3/uL (ref 0.0–0.1)
Basophils Relative: 1 %
Eosinophils Absolute: 0.2 10*3/uL (ref 0.0–0.5)
Eosinophils Relative: 3 %
HCT: 40.9 % (ref 39.0–52.0)
Hemoglobin: 13.6 g/dL (ref 13.0–17.0)
Immature Granulocytes: 1 %
Lymphocytes Relative: 19 %
Lymphs Abs: 1.7 10*3/uL (ref 0.7–4.0)
MCH: 32.2 pg (ref 26.0–34.0)
MCHC: 33.3 g/dL (ref 30.0–36.0)
MCV: 96.9 fL (ref 80.0–100.0)
Monocytes Absolute: 1.2 10*3/uL — ABNORMAL HIGH (ref 0.1–1.0)
Monocytes Relative: 14 %
Neutro Abs: 5.5 10*3/uL (ref 1.7–7.7)
Neutrophils Relative %: 62 %
Platelet Count: 202 10*3/uL (ref 150–400)
RBC: 4.22 MIL/uL (ref 4.22–5.81)
RDW: 16.6 % — ABNORMAL HIGH (ref 11.5–15.5)
WBC Count: 8.8 10*3/uL (ref 4.0–10.5)
nRBC: 0 % (ref 0.0–0.2)

## 2021-07-20 NOTE — Telephone Encounter (Signed)
CRITICAL VALUE STICKER  CRITICAL VALUE: Creatinine = 7.18  RECEIVER (on-site recipient of call): Yetta Glassman, [place] NOTIFIED: 07/20/21 at 10:00am  MESSENGER (representative from lab): Delsa Sale  MD NOTIFIED: Dr. Julien Nordmann  TIME OF NOTIFICATION: 07/20/21 at 10:05am  RESPONSE: Pt has ERSD.

## 2021-07-21 ENCOUNTER — Ambulatory Visit: Payer: Medicare Other | Admitting: Internal Medicine

## 2021-07-21 DIAGNOSIS — Z992 Dependence on renal dialysis: Secondary | ICD-10-CM | POA: Diagnosis not present

## 2021-07-21 DIAGNOSIS — N186 End stage renal disease: Secondary | ICD-10-CM | POA: Diagnosis not present

## 2021-07-21 DIAGNOSIS — Z23 Encounter for immunization: Secondary | ICD-10-CM | POA: Diagnosis not present

## 2021-07-21 DIAGNOSIS — D689 Coagulation defect, unspecified: Secondary | ICD-10-CM | POA: Diagnosis not present

## 2021-07-21 DIAGNOSIS — R52 Pain, unspecified: Secondary | ICD-10-CM | POA: Diagnosis not present

## 2021-07-21 DIAGNOSIS — N2581 Secondary hyperparathyroidism of renal origin: Secondary | ICD-10-CM | POA: Diagnosis not present

## 2021-07-23 DIAGNOSIS — D689 Coagulation defect, unspecified: Secondary | ICD-10-CM | POA: Diagnosis not present

## 2021-07-23 DIAGNOSIS — R52 Pain, unspecified: Secondary | ICD-10-CM | POA: Diagnosis not present

## 2021-07-23 DIAGNOSIS — N186 End stage renal disease: Secondary | ICD-10-CM | POA: Diagnosis not present

## 2021-07-23 DIAGNOSIS — Z992 Dependence on renal dialysis: Secondary | ICD-10-CM | POA: Diagnosis not present

## 2021-07-23 DIAGNOSIS — N2581 Secondary hyperparathyroidism of renal origin: Secondary | ICD-10-CM | POA: Diagnosis not present

## 2021-07-23 DIAGNOSIS — Z23 Encounter for immunization: Secondary | ICD-10-CM | POA: Diagnosis not present

## 2021-07-25 ENCOUNTER — Ambulatory Visit (HOSPITAL_COMMUNITY): Payer: Medicare Other

## 2021-07-25 ENCOUNTER — Other Ambulatory Visit: Payer: Self-pay

## 2021-07-25 ENCOUNTER — Inpatient Hospital Stay (HOSPITAL_BASED_OUTPATIENT_CLINIC_OR_DEPARTMENT_OTHER): Payer: Medicare Other | Admitting: Internal Medicine

## 2021-07-25 VITALS — BP 157/79 | HR 71 | Temp 97.7°F | Resp 19 | Ht 69.0 in | Wt 161.2 lb

## 2021-07-25 DIAGNOSIS — Z7901 Long term (current) use of anticoagulants: Secondary | ICD-10-CM | POA: Diagnosis not present

## 2021-07-25 DIAGNOSIS — Z923 Personal history of irradiation: Secondary | ICD-10-CM | POA: Diagnosis not present

## 2021-07-25 DIAGNOSIS — J432 Centrilobular emphysema: Secondary | ICD-10-CM | POA: Diagnosis not present

## 2021-07-25 DIAGNOSIS — C3490 Malignant neoplasm of unspecified part of unspecified bronchus or lung: Secondary | ICD-10-CM

## 2021-07-25 DIAGNOSIS — N186 End stage renal disease: Secondary | ICD-10-CM | POA: Diagnosis not present

## 2021-07-25 DIAGNOSIS — Z79899 Other long term (current) drug therapy: Secondary | ICD-10-CM | POA: Diagnosis not present

## 2021-07-25 DIAGNOSIS — Z86010 Personal history of colonic polyps: Secondary | ICD-10-CM | POA: Diagnosis not present

## 2021-07-25 DIAGNOSIS — D61818 Other pancytopenia: Secondary | ICD-10-CM | POA: Diagnosis not present

## 2021-07-25 DIAGNOSIS — K439 Ventral hernia without obstruction or gangrene: Secondary | ICD-10-CM | POA: Diagnosis not present

## 2021-07-25 DIAGNOSIS — I700 Atherosclerosis of aorta: Secondary | ICD-10-CM | POA: Diagnosis not present

## 2021-07-25 DIAGNOSIS — M6281 Muscle weakness (generalized): Secondary | ICD-10-CM | POA: Diagnosis not present

## 2021-07-25 DIAGNOSIS — Z992 Dependence on renal dialysis: Secondary | ICD-10-CM | POA: Diagnosis not present

## 2021-07-25 DIAGNOSIS — I252 Old myocardial infarction: Secondary | ICD-10-CM | POA: Diagnosis not present

## 2021-07-25 DIAGNOSIS — Z880 Allergy status to penicillin: Secondary | ICD-10-CM | POA: Diagnosis not present

## 2021-07-25 DIAGNOSIS — J439 Emphysema, unspecified: Secondary | ICD-10-CM | POA: Diagnosis not present

## 2021-07-25 DIAGNOSIS — C3411 Malignant neoplasm of upper lobe, right bronchus or lung: Secondary | ICD-10-CM | POA: Diagnosis not present

## 2021-07-25 DIAGNOSIS — Z8719 Personal history of other diseases of the digestive system: Secondary | ICD-10-CM | POA: Diagnosis not present

## 2021-07-25 DIAGNOSIS — K219 Gastro-esophageal reflux disease without esophagitis: Secondary | ICD-10-CM | POA: Diagnosis not present

## 2021-07-25 NOTE — Progress Notes (Signed)
[facility] [phone]   [phone]  OFFICE PROGRESS NOTE  Jeremy Noble, MD [address] [place] [address]  DIAGNOSIS: Limited stage (T2b, N1, M0) small cell lung cancer presented with right upper lobe with invasion of superior segment of right lower lobe as well as right hilar lymphadenopathy diagnosed in March 2020.  PRIOR THERAPY:  Systemic chemotherapy with carboplatin for AUC of 5 on day 1 and etoposide 100 mg/M2 on days 1, 2 and 3 every 3 weeks.  Status post 4 cycles.  CURRENT THERAPY: Observation.  INTERVAL HISTORY: Jeremy Johnson 75 y.o. male returns to the clinic today for 5-month follow-up visit accompanied by his wife.  The patient is feeling fine today with no concerning complaints except for weakness in his legs.  He is currently undergoing physical therapy.  He denied having any current chest pain, shortness of breath, cough or hemoptysis.  He denied having any fever or chills.  He has no nausea, vomiting, diarrhea or constipation.  He denied having any headache or visual changes.  He is currently on hemodialysis because of the renal failure.  The patient had repeat CT scan of the chest performed recently and he is here for evaluation and discussion of his discuss results.  MEDICAL HISTORY: Past Medical History:  Diagnosis Date   Anemia    Blood transfusion without reported diagnosis    CAD (coronary artery disease)    STENT... MID CIRCUMFLEX...1997   Chronic kidney disease    STAGE 3   COPD (chronic obstructive pulmonary disease) (HCC)    Degenerative joint disease (DJD) of lumbar spine    GERD (gastroesophageal reflux disease)    Gout    Hyperlipidemia    Hypertension    Hypothyroidism    Incisional hernia    abdomen   Leukocytosis    CHRONIC MILD   Myocardial infarction ([facility])    1997   SCL CA dx'd 01/2019   Lung cancer    ALLERGIES:  is allergic to advair hfa [fluticasone-salmeterol] and  penicillins.  MEDICATIONS:  Current Outpatient Medications  Medication Sig Dispense Refill   acetaminophen (TYLENOL) 325 MG tablet Take 2 tablets (650 mg total) by mouth every 6 (six) hours as needed for mild pain (or Fever >/= 101). (Patient taking differently: Take 1,300 mg by mouth every 6 (six) hours as needed for mild pain or headache (or Fever >/= 101). Arthritis strength) 30 tablet 0   allopurinol (ZYLOPRIM) 100 MG tablet Take 1 tablet (100 mg total) by mouth daily. (Patient taking differently: Take 100 mg by mouth in the morning.) 30 tablet 1   amiodarone (PACERONE) 200 MG tablet Take 1 tablet (200 mg total) by mouth daily. 10 tablet 3   atorvastatin (LIPITOR) 40 MG tablet TAKE 1 TABLET BY MOUTH  DAILY (Patient taking differently: Take 40 mg by mouth daily.) 60 tablet 5   AURYXIA 1 GM 210 MG(Fe) tablet Take by mouth.     B Complex-C-Zn-Folic Acid (DIALYVITE 474-QVZD 15) 0.8 MG TABS Take 1 tablet by mouth daily.     cephALEXin (KEFLEX) 500 MG capsule Take 1 capsule (500 mg total) by mouth 3 (three) times daily. (Patient not taking: Reported on 07/08/2021) 21 capsule 0   clopidogrel (PLAVIX) 75 MG tablet TAKE 1 TABLET BY MOUTH  DAILY WITH BREAKFAST (Patient taking differently: Take 75 mg by mouth daily with breakfast.) 60 tablet 5   ELIQUIS 2.5 MG TABS tablet TAKE 1 TABLET BY MOUTH  TWICE DAILY (Patient taking differently: Take 2.5 mg by mouth 2 (two) times daily.) 180 tablet 3   HYDROcodone-acetaminophen (NORCO/VICODIN) 5-325 MG tablet Take 1 tablet by mouth every 4 (four) hours as needed for moderate pain. (Patient not taking: Reported on 07/08/2021) 15 tablet 0   ipratropium (ATROVENT) 0.06 % nasal spray Place 2 sprays into both nostrils daily as needed for rhinitis. (Patient not taking: Reported on 07/08/2021)     levocetirizine (XYZAL) 5 MG tablet Take 5 mg by mouth at bedtime. (Patient not taking: Reported on 07/08/2021)     levothyroxine (SYNTHROID, LEVOTHROID) 175 MCG tablet Take 175 mcg  by mouth daily before breakfast.      lidocaine-prilocaine (EMLA) cream Apply 1 application topically Every Tuesday,Thursday,and Saturday with dialysis.     metoprolol succinate (TOPROL-XL) 25 MG 24 hr tablet Take 0.5 tablets (12.5 mg total) by mouth daily. 45 tablet 3   omeprazole (PRILOSEC) 40 MG capsule Take 1 capsule (40 mg total) by mouth in the morning and at bedtime. (Patient taking differently: Take 40 mg by mouth daily.) 60 capsule 5   sevelamer carbonate (RENVELA) 800 MG tablet Take 800-1,600 mg by mouth 3 (three) times daily with meals. (Patient not taking: Reported on 07/08/2021)     No current facility-administered medications for this visit.    SURGICAL HISTORY:  Past Surgical History:  Procedure Laterality Date   ABDOMINAL AORTIC ANEURYSM REPAIR  2006   AV FISTULA PLACEMENT Left 04/25/2019   Procedure: ARTERIOVENOUS (AV) FISTULA CREATION LEFT ARM;  Surgeon: Angelia Mould, MD;  Location: [facility];  Service: Vascular;  Laterality: Left;   AV FISTULA PLACEMENT Left 05/19/2019   Procedure: CONVERSION OF LEFT ARM ARTERIOVENOUS FISTULA TO GRAFT;  Surgeon: Angelia Mould, MD;  Location: [facility];  Service: Vascular;  Laterality: Left;   BIOPSY  09/30/2018   Procedure: BIOPSY;  Surgeon: Danie Binder, MD;  [facility];  Service: Endoscopy;;  ascending colon   BIOPSY  07/27/2020   Procedure: BIOPSY;  Surgeon: Eloise Harman, DO;  [facility];  Service: Endoscopy;;  gastric   BUBBLE STUDY  09/23/2020   Procedure: BUBBLE STUDY;  Surgeon: Skeet Latch, MD;  Location: [facility];  Service: Cardiovascular;;   CARDIOVERSION N/A 09/23/2020   Procedure: CARDIOVERSION;  Surgeon: Skeet Latch, MD;  Location: [facility];  Service: Cardiovascular;  Laterality: N/A;   COLONOSCOPY  2008   COLONOSCOPY N/A 09/30/2018   External and internal hemorrhoids, six polyps removed, one ascending colon polypoid lesion biopsied. Six simple adenomas and one  benign polypoid lesion. Colonoscopy Nov 2022.    COLONOSCOPY WITH PROPOFOL N/A 07/27/2020   non-bleeding internal hemorrhoids, sigmoid and descending colon diverticulosis, four 1-2 mm polyps in ascending colon, one 5 mm polyp in transverse colon. 3 year surveillance. Tubular adenomas.    CORONARY ANGIOPLASTY WITH STENT PLACEMENT  1997   MID CIRCUMFLEX   CORONARY STENT INTERVENTION N/A 09/20/2020   Procedure: CORONARY STENT INTERVENTION;  Surgeon: Martinique, Peter M, MD;  Location: [facility];  Service: Cardiovascular;  Laterality: N/A;   CYSTOSCOPY W/ RETROGRADES Bilateral 03/28/2021   Procedure: CYSTOSCOPY WITH RETROGRADE PYELOGRAM;  Surgeon: Cleon Gustin, MD;  [facility];  Service: Urology;  Laterality: Bilateral;   ESOPHAGOGASTRODUODENOSCOPY (EGD) WITH PROPOFOL N/A 07/27/2020   Food in middle third of esophagus, gastritis s/p biopsy, nodular mucosa in lesser curvature of stomach s/p biopsy. Negative H.pylori.    HIP ARTHROPLASTY Right 04/30/2020   Procedure: ARTHROPLASTY  HIP (  HEMIARTHROPLASTY);  Surgeon: Altamese [place], MD;  Location: [facility];  Service: Orthopedics;  Laterality: Right;   INTRAVASCULAR ULTRASOUND/IVUS N/A 09/20/2020   Procedure: Intravascular Ultrasound/IVUS;  Surgeon: Martinique, Peter M, MD;  Location: [facility];  Service: Cardiovascular;  Laterality: N/A;   IR FLUORO GUIDE CV LINE RIGHT  04/22/2019   IR FLUORO GUIDE CV LINE RIGHT  05/01/2020   IR THORACENTESIS ASP PLEURAL SPACE W/IMG GUIDE  09/22/2020   IR THROMBECTOMY AV FISTULA W/THROMBOLYSIS/PTA INC/SHUNT/IMG LEFT Left 05/03/2020   IR US GUIDE VASC ACCESS LEFT  05/03/2020   IR US GUIDE VASC ACCESS RIGHT  04/22/2019   IR US GUIDE VASC ACCESS RIGHT  05/01/2020   POLYPECTOMY  09/30/2018   Procedure: POLYPECTOMY;  Surgeon: Danie Binder, MD;  [facility];  Service: Endoscopy;;  colon   POLYPECTOMY  07/27/2020   Procedure: POLYPECTOMY;  Surgeon: Eloise Harman, DO;  [facility];   Service: Endoscopy;;   RIGHT/LEFT HEART CATH AND CORONARY ANGIOGRAPHY N/A 09/20/2020   Procedure: RIGHT/LEFT HEART CATH AND CORONARY ANGIOGRAPHY;  Surgeon: Martinique, Peter M, MD;  Location: [facility];  Service: Cardiovascular;  Laterality: N/A;   TEE WITHOUT CARDIOVERSION N/A 09/23/2020   Procedure: TRANSESOPHAGEAL ECHOCARDIOGRAM (TEE);  Surgeon: Skeet Latch, MD;  Location: [facility];  Service: Cardiovascular;  Laterality: N/A;   TRANSURETHRAL RESECTION OF BLADDER TUMOR N/A 03/28/2021   Procedure: TRANSURETHRAL RESECTION OF BLADDER TUMOR (TURBT);  Surgeon: Cleon Gustin, MD;  [facility];  Service: Urology;  Laterality: N/A;   VIDEO BRONCHOSCOPY WITH ENDOBRONCHIAL NAVIGATION N/A 01/27/2019   Procedure: VIDEO BRONCHOSCOPY WITH ENDOBRONCHIAL NAVIGATION;  Surgeon: Grace Isaac, MD;  Location: [facility];  Service: Thoracic;  Laterality: N/A;   VIDEO BRONCHOSCOPY WITH ENDOBRONCHIAL ULTRASOUND N/A 01/27/2019   Procedure: VIDEO BRONCHOSCOPY WITH ENDOBRONCHIAL ULTRASOUND;  Surgeon: Grace Isaac, MD;  Location: [facility];  Service: Thoracic;  Laterality: N/A;    REVIEW OF SYSTEMS:  A comprehensive review of systems was negative except for: Musculoskeletal: positive for muscle weakness   PHYSICAL EXAMINATION: General appearance: alert, cooperative, and no distress Head: Normocephalic, without obvious abnormality, atraumatic Neck: no adenopathy, no JVD, supple, symmetrical, trachea midline, and thyroid not enlarged, symmetric, no tenderness/mass/nodules Lymph nodes: Cervical, supraclavicular, and axillary nodes normal. Resp: clear to auscultation bilaterally Back: symmetric, no curvature. ROM normal. No CVA tenderness. Cardio: regular rate and rhythm, S1, S2 normal, no murmur, click, rub or gallop GI: soft, non-tender; bowel sounds normal; no masses,  no organomegaly Extremities: extremities normal, atraumatic, no cyanosis or edema  ECOG PERFORMANCE STATUS: 0 -  Asymptomatic  Blood pressure (!) 157/79, pulse 71, temperature 97.7 F (36.5 C), temperature source Tympanic, resp. rate 19, height 5\' 9"  (1.753 m), weight 161 lb 3.2 oz (73.1 kg), SpO2 100 %.  LABORATORY DATA: Lab Results  Component Value Date   WBC 8.8 07/20/2021   HGB 13.6 07/20/2021   HCT 40.9 07/20/2021   MCV 96.9 07/20/2021   PLT 202 07/20/2021      Chemistry      Component Value Date/Time   NA 137 07/20/2021 0911   K 4.5 07/20/2021 0911   CL 95 (L) 07/20/2021 0911   CO2 29 07/20/2021 0911   BUN 22 07/20/2021 0911   CREATININE 7.18 (HH) 07/20/2021 0911      Component Value Date/Time   CALCIUM 9.1 07/20/2021 0911   ALKPHOS 90 07/20/2021 0911   AST 22 07/20/2021 0911   ALT 14 07/20/2021 0911  BILITOT 0.5 07/20/2021 0911       RADIOGRAPHIC STUDIES: DG Ribs Unilateral W/Chest Right  Result Date: 07/11/2021 CLINICAL DATA:  Fall 1 week ago with persistent right chest pain, initial encounter EXAM: RIGHT RIBS AND CHEST - 3+ VIEW COMPARISON:  05/24/2021 FINDINGS: Cardiac shadow is stable. Lungs are well aerated bilaterally. Stable left perihilar and upper lobe scarring is noted similar to that seen on the prior exam. New small left effusion is noted. No focal confluent infiltrate is seen. No rib fracture is identified. No pneumothorax is seen. IMPRESSION: Small left effusion with basilar atelectasis. No acute bony abnormality is noted. Electronically Signed   By: Inez Catalina M.D.   On: 07/11/2021 16:35   CT Chest Wo Contrast  Result Date: 07/20/2021 CLINICAL DATA:  Primary Cancer Type: Lung Imaging Indication: Routine surveillance Interval therapy since last imaging? No Initial Cancer Diagnosis Date: 01/27/2019; Established by: Biopsy-proven Detailed Pathology: Limited stage small cell lung cancer. Primary Tumor location: Right upper lobe with invasion of superior segment of right lower lobe. Surgeries: TURBT 03/28/2021.  Cardiac stent.  AAA repair. Chemotherapy: Yes; Ongoing?  No; Most recent administration: 05/12/2019 Immunotherapy? No Radiation therapy? No EXAM: CT CHEST WITHOUT CONTRAST TECHNIQUE: Multidetector CT imaging of the chest was performed following the standard protocol without IV contrast. COMPARISON:  Most recent CT chest 01/17/2021. FINDINGS: Cardiovascular: No acute findings. Stable aneurysm of the thoracic aortic arch measuring 4.5 cm in diameter. No evidence of mediastinal hematoma. Aortic and coronary atherosclerotic calcification noted. Stable mild cardiomegaly. Mediastinum/Nodes: No masses or pathologically enlarged lymph nodes identified on this unenhanced exam. Lungs/Pleura: Moderate centrilobular emphysema again noted. Post radiation changes in the left lung paramediastinal region shows no significant change. Stable bilateral pleural-parenchymal scarring, left side greater than right. No suspicious pulmonary nodules or masses are identified. No evidence of pleural effusion. Upper Abdomen: No acute findings. Epigastric ventral abdominal wall hernia is seen containing a loop of transverse colon. Musculoskeletal:  No suspicious bone lesions. IMPRESSION: Stable post radiation changes in left lung. No evidence of recurrent or metastatic carcinoma within the thorax. Stable 4.5 cm thoracic aortic arch aneurysm. Recommend semi-annual imaging followup by CT and referral to cardiothoracic surgery if not already obtained. This recommendation follows 2010 ACCF/AHA/AATS/ACR/ASA/SCA/SCAI/SIR/STS/SVM Guidelines for the Diagnosis and Management of Patients With Thoracic Aortic Disease. Circulation. 2010; 121: S854-O27. Aortic aneurysm NOS (ICD10-I71.9) Aortic Atherosclerosis (ICD10-I70.0) and Emphysema (ICD10-J43.9). Electronically Signed   By: Marlaine Hind M.D.   On: 07/20/2021 11:36     ASSESSMENT AND PLAN: This is a very pleasant 75 years old white male with limited stage small cell lung cancer and currently undergoing systemic chemotherapy with carboplatin and etoposide  status post 4 cycles. The patient tolerated his treatment well except for the pancytopenia that was complicated by his end-stage renal disease. The patient is currently on observation and he is feeling fine today with no concerning complaints except for the weakness in the lower extremities and he is currently undergoing physical therapy. He had repeat CT scan of the chest without contrast performed recently.  I personally and independently reviewed the scans and discussed the results with the patient and his wife today. His scan showed no concerning findings for disease recurrence or metastasis. I recommended for the patient to continue on observation with repeat CT scan of the chest without contrast in 6 months. For the end-stage renal disease, he is currently on hemodialysis. He was advised to call immediately if he has any concerning symptoms in the interval. The  patient voices understanding of current disease status and treatment options and is in agreement with the current care plan.  All questions were answered. The patient knows to call the clinic with any problems, questions or concerns. We can certainly see the patient much sooner if necessary.  Disclaimer: This note was dictated with voice recognition software. Similar sounding words can inadvertently be transcribed and may not be corrected upon review.

## 2021-07-26 DIAGNOSIS — N186 End stage renal disease: Secondary | ICD-10-CM | POA: Diagnosis not present

## 2021-07-26 DIAGNOSIS — Z992 Dependence on renal dialysis: Secondary | ICD-10-CM | POA: Diagnosis not present

## 2021-07-26 DIAGNOSIS — N2581 Secondary hyperparathyroidism of renal origin: Secondary | ICD-10-CM | POA: Diagnosis not present

## 2021-07-26 DIAGNOSIS — R52 Pain, unspecified: Secondary | ICD-10-CM | POA: Diagnosis not present

## 2021-07-26 DIAGNOSIS — D689 Coagulation defect, unspecified: Secondary | ICD-10-CM | POA: Diagnosis not present

## 2021-07-26 DIAGNOSIS — Z23 Encounter for immunization: Secondary | ICD-10-CM | POA: Diagnosis not present

## 2021-07-27 ENCOUNTER — Ambulatory Visit (HOSPITAL_COMMUNITY): Payer: Medicare Other | Attending: Internal Medicine | Admitting: Physical Therapy

## 2021-07-27 ENCOUNTER — Encounter (HOSPITAL_COMMUNITY): Payer: Self-pay | Admitting: Physical Therapy

## 2021-07-27 ENCOUNTER — Other Ambulatory Visit: Payer: Self-pay

## 2021-07-27 DIAGNOSIS — R29898 Other symptoms and signs involving the musculoskeletal system: Secondary | ICD-10-CM

## 2021-07-27 DIAGNOSIS — R2689 Other abnormalities of gait and mobility: Secondary | ICD-10-CM | POA: Insufficient documentation

## 2021-07-27 DIAGNOSIS — M6281 Muscle weakness (generalized): Secondary | ICD-10-CM | POA: Insufficient documentation

## 2021-07-27 NOTE — Therapy (Signed)
[place], [place], [address] Phone: [phone]   Fax:  [phone]  Physical Therapy Treatment  Patient Details  Name: YASSEN KINNETT MRN: [id number] Date of Birth: 01-Jun-1946 Referring Provider (PT): [name] MD   Encounter Date: 07/27/2021   PT End of Session - 07/27/21 0831     Visit Number 6    Number of Visits 20    Date for PT Re-Evaluation 08/10/21    Authorization Type Primary [employer] medicare (no vl, no auth), Secondary [employer] (vl 30, no auth)    Progress Note Due on Visit 15    PT Start Time 0831    PT Stop Time 0911    PT Time Calculation (min) 40 min    Activity Tolerance Patient tolerated treatment well    Behavior During Therapy [facility] for tasks assessed/performed             Past Medical History:  Diagnosis Date   Anemia    Blood transfusion without reported diagnosis    CAD (coronary artery disease)    STENT... MID CIRCUMFLEX...1997   Chronic kidney disease    STAGE 3   COPD (chronic obstructive pulmonary disease) (HCC)    Degenerative joint disease (DJD) of lumbar spine    GERD (gastroesophageal reflux disease)    Gout    Hyperlipidemia    Hypertension    Hypothyroidism    Incisional hernia    abdomen   Leukocytosis    CHRONIC MILD   Myocardial infarction ([place])    1997   SCL CA dx'd 01/2019   Lung cancer    Past Surgical History:  Procedure Laterality Date   ABDOMINAL AORTIC ANEURYSM REPAIR  2006   AV FISTULA PLACEMENT Left 04/25/2019   Procedure: ARTERIOVENOUS (AV) FISTULA CREATION LEFT ARM;  Surgeon: Angelia Mould, MD;  Location: [place];  Service: Vascular;  Laterality: Left;   AV FISTULA PLACEMENT Left 05/19/2019   Procedure: CONVERSION OF LEFT ARM ARTERIOVENOUS FISTULA TO GRAFT;  Surgeon: Angelia Mould, MD;  Location: [place];  Service: Vascular;  Laterality: Left;   BIOPSY  09/30/2018   Procedure: BIOPSY;  Surgeon: Danie Binder,  MD;  [facility];  Service: Endoscopy;;  ascending colon   BIOPSY  07/27/2020   Procedure: BIOPSY;  Surgeon: Eloise Harman, DO;  [facility];  Service: Endoscopy;;  gastric   BUBBLE STUDY  09/23/2020   Procedure: BUBBLE STUDY;  Surgeon: Skeet Latch, MD;  Location: [place];  Service: Cardiovascular;;   CARDIOVERSION N/A 09/23/2020   Procedure: CARDIOVERSION;  Surgeon: Skeet Latch, MD;  Location: [place];  Service: Cardiovascular;  Laterality: N/A;   COLONOSCOPY  2008   COLONOSCOPY N/A 09/30/2018   External and internal hemorrhoids, six polyps removed, one ascending colon polypoid lesion biopsied. Six simple adenomas and one benign polypoid lesion. Colonoscopy Nov 2022.    COLONOSCOPY WITH PROPOFOL N/A 07/27/2020   non-bleeding internal hemorrhoids, sigmoid and descending colon diverticulosis, four 1-2 mm polyps in ascending colon, one 5 mm polyp in transverse colon. 3 year surveillance. Tubular adenomas.    CORONARY ANGIOPLASTY WITH STENT PLACEMENT  1997   MID CIRCUMFLEX   CORONARY STENT INTERVENTION N/A 09/20/2020   Procedure: CORONARY STENT INTERVENTION;  Surgeon: Martinique, Peter M, MD;  Location: [place] [facility];  Service: Cardiovascular;  Laterality: N/A;   CYSTOSCOPY W/ RETROGRADES Bilateral 03/28/2021   Procedure: CYSTOSCOPY WITH RETROGRADE PYELOGRAM;  Surgeon: Nicolette Bang  L, MD;  [facility]: [facility];  Service: Urology;  Laterality: Bilateral;   ESOPHAGOGASTRODUODENOSCOPY (EGD) WITH PROPOFOL N/A 07/27/2020   Food in middle third of esophagus, gastritis s/p biopsy, nodular mucosa in lesser curvature of stomach s/p biopsy. Negative H.pylori.    HIP ARTHROPLASTY Right 04/30/2020   Procedure: ARTHROPLASTY  HIP (HEMIARTHROPLASTY);  Surgeon: Altamese Roseboro, MD;  Location: [place];  Service: Orthopedics;  Laterality: Right;   INTRAVASCULAR ULTRASOUND/IVUS N/A 09/20/2020   Procedure: Intravascular Ultrasound/IVUS;  Surgeon: Martinique, Peter M, MD;   Location: [place] [facility];  Service: Cardiovascular;  Laterality: N/A;   IR FLUORO GUIDE CV LINE RIGHT  04/22/2019   IR FLUORO GUIDE CV LINE RIGHT  05/01/2020   IR THORACENTESIS ASP PLEURAL SPACE W/IMG GUIDE  09/22/2020   IR THROMBECTOMY AV FISTULA W/THROMBOLYSIS/PTA INC/SHUNT/IMG LEFT Left 05/03/2020   IR US GUIDE VASC ACCESS LEFT  05/03/2020   IR US GUIDE VASC ACCESS RIGHT  04/22/2019   IR US GUIDE VASC ACCESS RIGHT  05/01/2020   POLYPECTOMY  09/30/2018   Procedure: POLYPECTOMY;  Surgeon: Danie Binder, MD;  [facility];  Service: Endoscopy;;  colon   POLYPECTOMY  07/27/2020   Procedure: POLYPECTOMY;  Surgeon: Eloise Harman, DO;  [facility];  Service: Endoscopy;;   RIGHT/LEFT HEART CATH AND CORONARY ANGIOGRAPHY N/A 09/20/2020   Procedure: RIGHT/LEFT HEART CATH AND CORONARY ANGIOGRAPHY;  Surgeon: Martinique, Peter M, MD;  Location: [place] [facility];  Service: Cardiovascular;  Laterality: N/A;   TEE WITHOUT CARDIOVERSION N/A 09/23/2020   Procedure: TRANSESOPHAGEAL ECHOCARDIOGRAM (TEE);  Surgeon: Skeet Latch, MD;  Location: [place];  Service: Cardiovascular;  Laterality: N/A;   TRANSURETHRAL RESECTION OF BLADDER TUMOR N/A 03/28/2021   Procedure: TRANSURETHRAL RESECTION OF BLADDER TUMOR (TURBT);  Surgeon: Cleon Gustin, MD;  [facility];  Service: Urology;  Laterality: N/A;   VIDEO BRONCHOSCOPY WITH ENDOBRONCHIAL NAVIGATION N/A 01/27/2019   Procedure: VIDEO BRONCHOSCOPY WITH ENDOBRONCHIAL NAVIGATION;  Surgeon: Grace Isaac, MD;  Location: [place];  Service: Thoracic;  Laterality: N/A;   VIDEO BRONCHOSCOPY WITH ENDOBRONCHIAL ULTRASOUND N/A 01/27/2019   Procedure: VIDEO BRONCHOSCOPY WITH ENDOBRONCHIAL ULTRASOUND;  Surgeon: Grace Isaac, MD;  Location: [place];  Service: Thoracic;  Laterality: N/A;    There were no vitals filed for this visit.   Subjective Assessment - 07/27/21 0834     Subjective States that he feels like some days he feels  like he is walking good and others he is not. States he is doing his exercises at home.    Patient is accompained by: Family member    Pertinent History R hip fracture last year, bladder cancer and treatment    Limitations Standing;Walking;House hold activities    How long can you walk comfortably? 15-20 minutes treadmill flat with UE support    Patient Stated Goals improve balance and strength    Currently in Pain? No/denies    Pain Onset In the past 7 days                [facility] PT Assessment - 07/27/21 0001       Assessment   Medical Diagnosis Falls    Referring Provider (PT) [name] MD    Onset Date/Surgical Date 12/02/20    Next MD Visit December                           [facility] Adult PT Treatment/Exercise - 07/27/21 0001  Knee/Hip Exercises: Standing   Lateral Step Up Both;10 reps;Step Height: 4";Hand Hold: 1;2 sets    Forward Step Up Hand Hold: 1;Step Height: 4";5 reps;4 sets;Both   wiht 4# ankle weight   Other Standing Knee Exercises narrow BOS on foam 4x5  b - walking the line 15 feet x4 with min assist as needed, backwards walking with gaurd assist x4 of 15 feet    Other Standing Knee Exercises foot taps on 4" step alternating no UE support 3x15 B; staggered stance balance x3 B 30" holds 4" step      Knee/Hip Exercises: Seated   Sit to Sand 10 reps   first set no reps, then 4# then 2 sets at 8# - 4 sets total                      PT Short Term Goals - 06/29/21 0943       PT SHORT TERM GOAL #1   Title Patient will be independent with HEP in order to improve functional outcomes.    Time 3    Period Weeks    Status On-going    Target Date 06/22/21      PT SHORT TERM GOAL #2   Title Patient will report at least 25% improvement in symptoms for improved quality of life.    Time 3    Period Weeks    Status On-going    Target Date 06/22/21               PT Long Term Goals - 06/29/21 0943       PT LONG TERM GOAL #1    Title Patient will report at least 75% improvement in symptoms for improved quality of life.    Time 6    Period Weeks    Status On-going      PT LONG TERM GOAL #2   Title Patient will be able to complete 5x STS in under 11.4 seconds in order to reduce the risk of falls.    Time 6    Period Weeks    Status On-going      PT LONG TERM GOAL #3   Title Patient will be able to ambulate at least 400 feet in 2MWT in order to demonstrate improved gait speed for community ambulation.    Time 6    Period Weeks    Status On-going      PT LONG TERM GOAL #4   Title Patient will score at least 19/24 on DGI in order to demonstrate decreased fall risk.    Time 6    Period Weeks    Status On-going                   Plan - 07/27/21 0831     Clinical Impression Statement Session focused on functional strengthening and balance. Reduced balance noted with weight shifts or favoring of the right lower extremity. No pain or loss of balance noted during session. Will continue to continue with current POC as tolerated by patient. Dynamic balance exercises very challenging for patient. Walking the line very challenging for patient, required contact guard assist and min assist with loss of balance.    Personal Factors and Comorbidities Age;Fitness;Past/Current Experience;Comorbidity 3+;Time since onset of injury/illness/exacerbation    Comorbidities hx cancer, hx R hip fracture, falls    Examination-Activity Limitations Locomotion Level;Transfers;Stand;Stairs;Squat;Lift;Carry    Examination-Participation Restrictions Meal Prep;Cleaning;Community Activity;Shop;Volunteer;Yard Work    Stability/Clinical Decision Making Stable/Uncomplicated  Rehab Potential Good    PT Frequency 2x / week    PT Duration 4 weeks    PT Treatment/Interventions ADLs/Self Care Home Management;Aquatic Therapy;Electrical Stimulation;Iontophoresis 4mg /ml Dexamethasone;Moist Heat;Traction;DME Instruction;Gait training;Stair  training;Functional mobility training;Therapeutic activities;Therapeutic exercise;Balance training;Neuromuscular re-education;Cognitive remediation;Patient/family education;Orthotic Fit/Training;Manual techniques;Compression bandaging;Passive range of motion;Dry needling;Energy conservation;Splinting;Taping    PT Next Visit Plan continue functional strength training, balance and gait training, evenually work on standing<> floor transfers    PT Home Exercise Plan STS, standing marching; 8/22 - heel/toe raise, hip ext/abd 8/24 tandem stance; 9/14 narrow BOS on foam with head turns    Consulted and Agree with Plan of Care Patient             Patient will benefit from skilled therapeutic intervention in order to improve the following deficits and impairments:  Abnormal gait, Difficulty walking, Decreased endurance, Decreased activity tolerance, Decreased balance, Impaired flexibility, Improper body mechanics, Decreased strength, Decreased mobility  Visit Diagnosis: Other abnormalities of gait and mobility  Muscle weakness (generalized)  Other symptoms and signs involving the musculoskeletal system     Problem List Patient Active Problem List   Diagnosis Date Noted   Malignant neoplasm of overlapping sites of bladder ([place]) 04/12/2021   Bladder tumor 12/07/2020   Gross hematuria 12/07/2020   Elevated LFTs 09/29/2020   Dysphagia 09/29/2020   A-fib ([place]) 09/22/2020   Palliative care by specialist    DNR (do not resuscitate) discussion    Acute systolic CHF (congestive heart failure) ([place])    Restless leg syndrome 09/17/2020   Elevated troponin I level 09/17/2020   Loss of weight 06/09/2020   Early satiety 06/09/2020   Closed displaced fracture of right femoral neck with delayed healing 04/29/2020   Closed displaced fracture of right femoral neck ([place]) 04/29/2020   Acute respiratory failure with hypoxia ([place]) 04/06/2020   Acute and chronic respiratory failure with hypoxia ([place])  04/05/2020   Chronic obstructive pulmonary disease/emphysema    Anaphylactic reaction due to adverse effect of correct drug or medicament properly administered, initial encounter 07/30/2019   Hypocalcemia 05/31/2019   Other disorders of phosphorus metabolism 05/31/2019   Pancytopenia ([place]) 05/22/2019   ESRD (end stage renal disease) on dialysis ([place]) 05/22/2019   Unspecified protein-calorie malnutrition ([place]) 05/02/2019   Coagulation defect, unspecified ([place]) 04/26/2019   Diarrhea, unspecified 04/26/2019   Hypokalemia 04/26/2019   Pain, unspecified 04/26/2019   Pruritus, unspecified 04/26/2019   Secondary hyperparathyroidism of renal origin ([place]) 04/26/2019   Encounter for immunization 04/26/2019   Incisional hernia without obstruction or gangrene 04/25/2019   Malignant neoplasm of unspecified part of unspecified bronchus or lung ([place]) 04/25/2019   Other specified degenerative diseases of nervous system ([place]) 04/25/2019   Anemia in chronic kidney disease 04/25/2019   Atherosclerotic heart disease of native coronary artery without angina pectoris 04/25/2019   Gastro-esophageal reflux disease without esophagitis 04/25/2019   Hypothyroidism, unspecified 04/25/2019   Acute renal failure superimposed on stage 3 chronic kidney disease ([place]) 04/16/2019   Anemia in chronic kidney disease (CKD) 04/14/2019   Small cell lung cancer ([place]) 02/06/2019   Encounter for antineoplastic chemotherapy 02/06/2019   Goals of care, counseling/discussion 02/06/2019   Special screening for malignant neoplasms, colon    GERD (gastroesophageal reflux disease)    Hyperlipidemia    Gout    Hypothyroidism    Degenerative joint disease (DJD) of lumbar spine    Acquired hypothyroidism 02/02/2010   HLD (hyperlipidemia) 02/02/2010   Essential hypertension 02/02/2010   GERD 02/02/2010   ABDOMINAL  AORTIC ANEURYSM REPAIR, HX OF 02/02/2010   9:15 AM, 07/27/21 Jerene Pitch, DPT Physical Therapy with  [facility]  [phone] office   [place] [address] [place], [place], [address] Phone: [phone]   Fax:  [phone]  Name: DIARRA KOS MRN: [id number] Date of Birth: [birth year]

## 2021-07-28 DIAGNOSIS — N2581 Secondary hyperparathyroidism of renal origin: Secondary | ICD-10-CM | POA: Diagnosis not present

## 2021-07-28 DIAGNOSIS — Z992 Dependence on renal dialysis: Secondary | ICD-10-CM | POA: Diagnosis not present

## 2021-07-28 DIAGNOSIS — D689 Coagulation defect, unspecified: Secondary | ICD-10-CM | POA: Diagnosis not present

## 2021-07-28 DIAGNOSIS — R52 Pain, unspecified: Secondary | ICD-10-CM | POA: Diagnosis not present

## 2021-07-28 DIAGNOSIS — Z23 Encounter for immunization: Secondary | ICD-10-CM | POA: Diagnosis not present

## 2021-07-28 DIAGNOSIS — N186 End stage renal disease: Secondary | ICD-10-CM | POA: Diagnosis not present

## 2021-07-30 DIAGNOSIS — Z992 Dependence on renal dialysis: Secondary | ICD-10-CM | POA: Diagnosis not present

## 2021-07-30 DIAGNOSIS — Z23 Encounter for immunization: Secondary | ICD-10-CM | POA: Diagnosis not present

## 2021-07-30 DIAGNOSIS — N2581 Secondary hyperparathyroidism of renal origin: Secondary | ICD-10-CM | POA: Diagnosis not present

## 2021-07-30 DIAGNOSIS — D689 Coagulation defect, unspecified: Secondary | ICD-10-CM | POA: Diagnosis not present

## 2021-07-30 DIAGNOSIS — N186 End stage renal disease: Secondary | ICD-10-CM | POA: Diagnosis not present

## 2021-07-30 DIAGNOSIS — R52 Pain, unspecified: Secondary | ICD-10-CM | POA: Diagnosis not present

## 2021-08-01 ENCOUNTER — Telehealth: Payer: Self-pay | Admitting: Cardiology

## 2021-08-01 ENCOUNTER — Encounter (HOSPITAL_COMMUNITY): Payer: Self-pay | Admitting: Physical Therapy

## 2021-08-01 ENCOUNTER — Ambulatory Visit (HOSPITAL_COMMUNITY): Payer: Medicare Other | Admitting: Physical Therapy

## 2021-08-01 ENCOUNTER — Other Ambulatory Visit: Payer: Self-pay

## 2021-08-01 DIAGNOSIS — R29898 Other symptoms and signs involving the musculoskeletal system: Secondary | ICD-10-CM

## 2021-08-01 DIAGNOSIS — M6281 Muscle weakness (generalized): Secondary | ICD-10-CM | POA: Diagnosis not present

## 2021-08-01 DIAGNOSIS — R2689 Other abnormalities of gait and mobility: Secondary | ICD-10-CM | POA: Diagnosis not present

## 2021-08-01 NOTE — Telephone Encounter (Signed)
Patient states his hypotension with dialysis is resoled. He states it actually runs a "bit high" there now.

## 2021-08-01 NOTE — Therapy (Signed)
[facility], [place], [address] Phone: [phone]   Fax:  [phone]  Physical Therapy Treatment  Patient Details  Name: Jeremy Johnson MRN: [id number] Date of Birth: [birth year] Referring Provider (PT): [name] MD   Encounter Date: 08/01/2021   PT End of Session - 08/01/21 1131     Visit Number 7    Number of Visits 20    Date for PT Re-Evaluation 08/10/21    Authorization Type Primary [employer] medicare (no vl, no auth), Secondary [employer] (vl 30, no auth)    Progress Note Due on Visit 15    PT Start Time 1131    PT Stop Time 1209    PT Time Calculation (min) 38 min    Activity Tolerance Patient tolerated treatment well    Behavior During Therapy WFL for tasks assessed/performed             Past Medical History:  Diagnosis Date   Anemia    Blood transfusion without reported diagnosis    CAD (coronary artery disease)    STENT... MID CIRCUMFLEX...1997   Chronic kidney disease    STAGE 3   COPD (chronic obstructive pulmonary disease) (HCC)    Degenerative joint disease (DJD) of lumbar spine    GERD (gastroesophageal reflux disease)    Gout    Hyperlipidemia    Hypertension    Hypothyroidism    Incisional hernia    abdomen   Leukocytosis    CHRONIC MILD   Myocardial infarction ([place])    1997   SCL CA dx'd 01/2019   Lung cancer    Past Surgical History:  Procedure Laterality Date   ABDOMINAL AORTIC ANEURYSM REPAIR  2006   AV FISTULA PLACEMENT Left 04/25/2019   Procedure: ARTERIOVENOUS (AV) FISTULA CREATION LEFT ARM;  Surgeon: Angelia Mould, MD;  Location: [facility];  Service: Vascular;  Laterality: Left;   AV FISTULA PLACEMENT Left 05/19/2019   Procedure: CONVERSION OF LEFT ARM ARTERIOVENOUS FISTULA TO GRAFT;  Surgeon: Angelia Mould, MD;  Location: [facility];  Service: Vascular;  Laterality: Left;   BIOPSY  09/30/2018   Procedure: BIOPSY;  Surgeon: Danie Binder,  MD;  [facility];  Service: Endoscopy;;  ascending colon   BIOPSY  07/27/2020   Procedure: BIOPSY;  Surgeon: Eloise Harman, DO;  [facility];  Service: Endoscopy;;  gastric   BUBBLE STUDY  09/23/2020   Procedure: BUBBLE STUDY;  Surgeon: Skeet Latch, MD;  Location: [facility];  Service: Cardiovascular;;   CARDIOVERSION N/A 09/23/2020   Procedure: CARDIOVERSION;  Surgeon: Skeet Latch, MD;  Location: [facility];  Service: Cardiovascular;  Laterality: N/A;   COLONOSCOPY  2008   COLONOSCOPY N/A 09/30/2018   External and internal hemorrhoids, six polyps removed, one ascending colon polypoid lesion biopsied. Six simple adenomas and one benign polypoid lesion. Colonoscopy Nov 2022.    COLONOSCOPY WITH PROPOFOL N/A 07/27/2020   non-bleeding internal hemorrhoids, sigmoid and descending colon diverticulosis, four 1-2 mm polyps in ascending colon, one 5 mm polyp in transverse colon. 3 year surveillance. Tubular adenomas.    CORONARY ANGIOPLASTY WITH STENT PLACEMENT  1997   MID CIRCUMFLEX   CORONARY STENT INTERVENTION N/A 09/20/2020   Procedure: CORONARY STENT INTERVENTION;  Surgeon: Martinique, Peter M, MD;  Location: [facility];  Service: Cardiovascular;  Laterality: N/A;   CYSTOSCOPY W/ RETROGRADES Bilateral 03/28/2021   Procedure: CYSTOSCOPY WITH RETROGRADE PYELOGRAM;  Surgeon: Nicolette Bang  L, MD;  Location: [facility];  Service: Urology;  Laterality: Bilateral;   ESOPHAGOGASTRODUODENOSCOPY (EGD) WITH PROPOFOL N/A 07/27/2020   Food in middle third of esophagus, gastritis s/p biopsy, nodular mucosa in lesser curvature of stomach s/p biopsy. Negative H.pylori.    HIP ARTHROPLASTY Right 04/30/2020   Procedure: ARTHROPLASTY  HIP (HEMIARTHROPLASTY);  Surgeon: Altamese , MD;  Location: [facility];  Service: Orthopedics;  Laterality: Right;   INTRAVASCULAR ULTRASOUND/IVUS N/A 09/20/2020   Procedure: Intravascular Ultrasound/IVUS;  Surgeon: Martinique, Peter M, MD;   Location: [facility];  Service: Cardiovascular;  Laterality: N/A;   IR FLUORO GUIDE CV LINE RIGHT  04/22/2019   IR FLUORO GUIDE CV LINE RIGHT  05/01/2020   IR THORACENTESIS ASP PLEURAL SPACE W/IMG GUIDE  09/22/2020   IR THROMBECTOMY AV FISTULA W/THROMBOLYSIS/PTA INC/SHUNT/IMG LEFT Left 05/03/2020   IR US GUIDE VASC ACCESS LEFT  05/03/2020   IR US GUIDE VASC ACCESS RIGHT  04/22/2019   IR US GUIDE VASC ACCESS RIGHT  05/01/2020   POLYPECTOMY  09/30/2018   Procedure: POLYPECTOMY;  Surgeon: Danie Binder, MD;  [facility];  Service: Endoscopy;;  colon   POLYPECTOMY  07/27/2020   Procedure: POLYPECTOMY;  Surgeon: Eloise Harman, DO;  [facility];  Service: Endoscopy;;   RIGHT/LEFT HEART CATH AND CORONARY ANGIOGRAPHY N/A 09/20/2020   Procedure: RIGHT/LEFT HEART CATH AND CORONARY ANGIOGRAPHY;  Surgeon: Martinique, Peter M, MD;  Location: [facility];  Service: Cardiovascular;  Laterality: N/A;   TEE WITHOUT CARDIOVERSION N/A 09/23/2020   Procedure: TRANSESOPHAGEAL ECHOCARDIOGRAM (TEE);  Surgeon: Skeet Latch, MD;  Location: [facility];  Service: Cardiovascular;  Laterality: N/A;   TRANSURETHRAL RESECTION OF BLADDER TUMOR N/A 03/28/2021   Procedure: TRANSURETHRAL RESECTION OF BLADDER TUMOR (TURBT);  Surgeon: Cleon Gustin, MD;  [facility];  Service: Urology;  Laterality: N/A;   VIDEO BRONCHOSCOPY WITH ENDOBRONCHIAL NAVIGATION N/A 01/27/2019   Procedure: VIDEO BRONCHOSCOPY WITH ENDOBRONCHIAL NAVIGATION;  Surgeon: Grace Isaac, MD;  Location: [facility];  Service: Thoracic;  Laterality: N/A;   VIDEO BRONCHOSCOPY WITH ENDOBRONCHIAL ULTRASOUND N/A 01/27/2019   Procedure: VIDEO BRONCHOSCOPY WITH ENDOBRONCHIAL ULTRASOUND;  Surgeon: Grace Isaac, MD;  Location: [facility];  Service: Thoracic;  Laterality: N/A;    There were no vitals filed for this visit.   Subjective Assessment - 08/01/21 1132     Subjective Nothing new, no falls.    Patient is  accompained by: Family member    Pertinent History R hip fracture last year, bladder cancer and treatment    Limitations Standing;Walking;House hold activities    How long can you walk comfortably? 15-20 minutes treadmill flat with UE support    Patient Stated Goals improve balance and strength    Currently in Pain? No/denies    Pain Onset In the past 7 days                               [facility] Adult PT Treatment/Exercise - 08/01/21 0001       Knee/Hip Exercises: Standing   Heel Raises Both;1 set;20 reps    Heel Raises Limitations TR 1x 20    Forward Lunges Both;10 reps;2 sets    Lateral Step Up Both;2 sets;10 reps;Step Height: 6"    Other Standing Knee Exercises NBOS on foam 3x 30 seconds    Other Standing Knee Exercises step taps 2x 15 bilateral 6 inch step without UE use; tandem  gait 6 x 15 feet with CGA      Knee/Hip Exercises: Seated   Sit to Sand 3 sets;10 reps   with 8# dumbell                    PT Education - 08/01/21 1132     Education Details HEP    Person(s) Educated Patient    Methods Explanation    Comprehension Verbalized understanding              PT Short Term Goals - 06/29/21 0943       PT SHORT TERM GOAL #1   Title Patient will be independent with HEP in order to improve functional outcomes.    Time 3    Period Weeks    Status On-going    Target Date 06/22/21      PT SHORT TERM GOAL #2   Title Patient will report at least 25% improvement in symptoms for improved quality of life.    Time 3    Period Weeks    Status On-going    Target Date 06/22/21               PT Long Term Goals - 06/29/21 0943       PT LONG TERM GOAL #1   Title Patient will report at least 75% improvement in symptoms for improved quality of life.    Time 6    Period Weeks    Status On-going      PT LONG TERM GOAL #2   Title Patient will be able to complete 5x STS in under 11.4 seconds in order to reduce the risk of falls.     Time 6    Period Weeks    Status On-going      PT LONG TERM GOAL #3   Title Patient will be able to ambulate at least 400 feet in 2MWT in order to demonstrate improved gait speed for community ambulation.    Time 6    Period Weeks    Status On-going      PT LONG TERM GOAL #4   Title Patient will score at least 19/24 on DGI in order to demonstrate decreased fall risk.    Time 6    Period Weeks    Status On-going                   Plan - 08/01/21 1131     Clinical Impression Statement Continued with LE strengthening and balance exercises today. Patient requires intermittent cueing and initial demonstration for lunge exercise with good carry over. Notes LE fatigue with balance on foam. Patient tending to utilize LLE>R with NBOS with increased R knee flexion. Requires CGA with tandem gait due to unsteadiness. Patient notes fatigue at end of session. Patient will continue to benefit from skilled physical therapy in order reduce impairment and improve function.    Personal Factors and Comorbidities Age;Fitness;Past/Current Experience;Comorbidity 3+;Time since onset of injury/illness/exacerbation    Comorbidities hx cancer, hx R hip fracture, falls    Examination-Activity Limitations Locomotion Level;Transfers;Stand;Stairs;Squat;Lift;Carry    Examination-Participation Restrictions Meal Prep;Cleaning;Community Activity;Shop;Volunteer;Yard Work    Stability/Clinical Decision Making Stable/Uncomplicated    Rehab Potential Good    PT Frequency 2x / week    PT Duration 4 weeks    PT Treatment/Interventions ADLs/Self Care Home Management;Aquatic Therapy;Electrical Stimulation;Iontophoresis 4mg /ml Dexamethasone;Moist Heat;Traction;DME Instruction;Gait training;Stair training;Functional mobility training;Therapeutic activities;Therapeutic exercise;Balance training;Neuromuscular re-education;Cognitive remediation;Patient/family education;Orthotic Fit/Training;Manual techniques;Compression  bandaging;Passive range of motion;Dry needling;Energy  conservation;Splinting;Taping    PT Next Visit Plan continue functional strength training, balance and gait training, evenually work on standing<> floor transfers    PT Home Exercise Plan STS, standing marching; 8/22 - heel/toe raise, hip ext/abd 8/24 tandem stance; 9/14 narrow BOS on foam with head turns    Consulted and Agree with Plan of Care Patient             Patient will benefit from skilled therapeutic intervention in order to improve the following deficits and impairments:  Abnormal gait, Difficulty walking, Decreased endurance, Decreased activity tolerance, Decreased balance, Impaired flexibility, Improper body mechanics, Decreased strength, Decreased mobility  Visit Diagnosis: Other abnormalities of gait and mobility  Muscle weakness (generalized)  Other symptoms and signs involving the musculoskeletal system     Problem List Patient Active Problem List   Diagnosis Date Noted   Malignant neoplasm of overlapping sites of bladder ([place]) 04/12/2021   Bladder tumor 12/07/2020   Gross hematuria 12/07/2020   Elevated LFTs 09/29/2020   Dysphagia 09/29/2020   A-fib ([place]) 09/22/2020   Palliative care by specialist    DNR (do not resuscitate) discussion    Acute systolic CHF (congestive heart failure) ([place])    Restless leg syndrome 09/17/2020   Elevated troponin I level 09/17/2020   Loss of weight 06/09/2020   Early satiety 06/09/2020   Closed displaced fracture of right femoral neck with delayed healing 04/29/2020   Closed displaced fracture of right femoral neck ([place]) 04/29/2020   Acute respiratory failure with hypoxia ([place]) 04/06/2020   Acute and chronic respiratory failure with hypoxia ([place]) 04/05/2020   Chronic obstructive pulmonary disease/emphysema    Anaphylactic reaction due to adverse effect of correct drug or medicament properly administered, initial encounter 07/30/2019   Hypocalcemia 05/31/2019   Other  disorders of phosphorus metabolism 05/31/2019   Pancytopenia ([place]) 05/22/2019   ESRD (end stage renal disease) on dialysis ([place]) 05/22/2019   Unspecified protein-calorie malnutrition ([place]) 05/02/2019   Coagulation defect, unspecified ([place]) 04/26/2019   Diarrhea, unspecified 04/26/2019   Hypokalemia 04/26/2019   Pain, unspecified 04/26/2019   Pruritus, unspecified 04/26/2019   Secondary hyperparathyroidism of renal origin ([place]) 04/26/2019   Encounter for immunization 04/26/2019   Incisional hernia without obstruction or gangrene 04/25/2019   Malignant neoplasm of unspecified part of unspecified bronchus or lung ([place]) 04/25/2019   Other specified degenerative diseases of nervous system ([place]) 04/25/2019   Anemia in chronic kidney disease 04/25/2019   Atherosclerotic heart disease of native coronary artery without angina pectoris 04/25/2019   Gastro-esophageal reflux disease without esophagitis 04/25/2019   Hypothyroidism, unspecified 04/25/2019   Acute renal failure superimposed on stage 3 chronic kidney disease ([place]) 04/16/2019   Anemia in chronic kidney disease (CKD) 04/14/2019   Small cell lung cancer ([place]) 02/06/2019   Encounter for antineoplastic chemotherapy 02/06/2019   Goals of care, counseling/discussion 02/06/2019   Special screening for malignant neoplasms, colon    GERD (gastroesophageal reflux disease)    Hyperlipidemia    Gout    Hypothyroidism    Degenerative joint disease (DJD) of lumbar spine    Acquired hypothyroidism 02/02/2010   HLD (hyperlipidemia) 02/02/2010   Essential hypertension 02/02/2010   GERD 02/02/2010   ABDOMINAL AORTIC ANEURYSM REPAIR, HX OF 02/02/2010    12:10 PM, 08/01/21 Mearl Latin PT, DPT Physical Therapist at [facility] [place], [place], [address] Phone: [phone]   Fax:  [phone]  Name: [facility] John  JESSEN [facility] MRN: [id number] Date of  Birth: [birth year]

## 2021-08-01 NOTE — Telephone Encounter (Signed)
New message    Pt c/o BP issue: STAT if pt c/o blurred vision, one-sided weakness or slurred speech  1. What are your last 5 BP readings? 176/82 today and yesterday 190/?  2. Are you having any other symptoms (ex. Dizziness, headache, blurred vision, passed out)?  no  3. What is your BP issue? Since cutting medication in half his bp is now staying elevated , please advise how to proceed.

## 2021-08-01 NOTE — Telephone Encounter (Signed)
I will forward to dr.Branch for review.

## 2021-08-01 NOTE — Telephone Encounter (Signed)
The main prior issue was low blood pressures with dialysis which would dictate how we can treat his blood pressure on a daily basis. Has the low bp's during dilaysis resolved?   Zandra Abts MD

## 2021-08-02 DIAGNOSIS — N2581 Secondary hyperparathyroidism of renal origin: Secondary | ICD-10-CM | POA: Diagnosis not present

## 2021-08-02 DIAGNOSIS — D689 Coagulation defect, unspecified: Secondary | ICD-10-CM | POA: Diagnosis not present

## 2021-08-02 DIAGNOSIS — Z23 Encounter for immunization: Secondary | ICD-10-CM | POA: Diagnosis not present

## 2021-08-02 DIAGNOSIS — R52 Pain, unspecified: Secondary | ICD-10-CM | POA: Diagnosis not present

## 2021-08-02 DIAGNOSIS — J449 Chronic obstructive pulmonary disease, unspecified: Secondary | ICD-10-CM | POA: Diagnosis not present

## 2021-08-02 DIAGNOSIS — N186 End stage renal disease: Secondary | ICD-10-CM | POA: Diagnosis not present

## 2021-08-02 DIAGNOSIS — Z992 Dependence on renal dialysis: Secondary | ICD-10-CM | POA: Diagnosis not present

## 2021-08-03 ENCOUNTER — Other Ambulatory Visit: Payer: Self-pay

## 2021-08-03 ENCOUNTER — Ambulatory Visit (INDEPENDENT_AMBULATORY_CARE_PROVIDER_SITE_OTHER): Payer: Medicare Other | Admitting: Urology

## 2021-08-03 ENCOUNTER — Encounter (HOSPITAL_COMMUNITY): Payer: Self-pay | Admitting: Physical Therapy

## 2021-08-03 ENCOUNTER — Ambulatory Visit (HOSPITAL_COMMUNITY): Payer: Medicare Other | Admitting: Physical Therapy

## 2021-08-03 VITALS — BP 159/80 | HR 80

## 2021-08-03 DIAGNOSIS — C678 Malignant neoplasm of overlapping sites of bladder: Secondary | ICD-10-CM

## 2021-08-03 DIAGNOSIS — M6281 Muscle weakness (generalized): Secondary | ICD-10-CM

## 2021-08-03 DIAGNOSIS — R29898 Other symptoms and signs involving the musculoskeletal system: Secondary | ICD-10-CM | POA: Diagnosis not present

## 2021-08-03 DIAGNOSIS — R2689 Other abnormalities of gait and mobility: Secondary | ICD-10-CM | POA: Diagnosis not present

## 2021-08-03 MED ORDER — LOSARTAN POTASSIUM 25 MG PO TABS: 25.0000 mg | ORAL_TABLET | Freq: Every day | ORAL | 3 refills | Status: DC

## 2021-08-03 MED ORDER — CEPHALEXIN 250 MG PO CAPS: 250.0000 mg | ORAL_CAPSULE | Freq: Once | ORAL | 0 refills | Status: AC

## 2021-08-03 NOTE — Telephone Encounter (Signed)
Can he start losartan 25mg  daily, update [place] in 1 week on bp's  J Omer Monter MD

## 2021-08-03 NOTE — Progress Notes (Signed)
Urological Symptom Review  Patient is experiencing the following symptoms: none   Review of Systems  Gastrointestinal (upper)  : Negative for upper GI symptoms  Gastrointestinal (lower) : Negative for lower GI symptoms  Constitutional : Negative for symptoms  Skin: Negative for skin symptoms  Eyes: Negative for eye symptoms  Ear/Nose/Throat : Negative for Ear/Nose/Throat symptoms  Hematologic/Lymphatic: Easy bruising  Cardiovascular : Negative for cardiovascular symptoms  Respiratory : Negative for respiratory symptoms  Endocrine: Negative for endocrine symptoms  Musculoskeletal: Negative for musculoskeletal symptoms  Neurological: Negative for neurological symptoms  Psychologic: Negative for psychiatric symptoms

## 2021-08-03 NOTE — H&P (View-Only) (Signed)
   08/03/21  CC: followup bladder cancer   HPI: [name] is a 75yo here for followup for high grade bladder cancer. He did not tolerate BCG  Blood pressure (!) 159/80, pulse 80. NED. A&Ox3.   No respiratory distress   Abd soft, NT, ND Normal phallus with bilateral descended testicles  Cystoscopy Procedure Note  Patient identification was confirmed, informed consent was obtained, and patient was prepped using Betadine solution.  Lidocaine jelly was administered per urethral meatus.     Pre-Procedure: - Inspection reveals a normal caliber ureteral meatus.  Procedure: The flexible cystoscope was introduced without difficulty - No urethral strictures/lesions are present. - Enlarged prostate  - Normal bladder neck - Bilateral ureteral orifices identified - multiple left lateral and posterior wall bladder tumors 2-3cm - No bladder stones - No trabeculation     Post-Procedure: - Patient tolerated the procedure well  Assessment/ Plan: Schedule for bladder tumor resection. Risks/benefits/alternatives discussed  No follow-ups on file.  [facility] Bang, MD

## 2021-08-03 NOTE — Telephone Encounter (Signed)
Left message to return call 

## 2021-08-03 NOTE — Progress Notes (Signed)
   08/03/21  CC: followup bladder cancer   HPI: [name] is a 75yo here for followup for high grade bladder cancer. He did not tolerate BCG  Blood pressure (!) 159/80, pulse 80. NED. A&Ox3.   No respiratory distress   Abd soft, NT, ND Normal phallus with bilateral descended testicles  Cystoscopy Procedure Note  Patient identification was confirmed, informed consent was obtained, and patient was prepped using Betadine solution.  Lidocaine jelly was administered per urethral meatus.     Pre-Procedure: - Inspection reveals a normal caliber ureteral meatus.  Procedure: The flexible cystoscope was introduced without difficulty - No urethral strictures/lesions are present. - Enlarged prostate  - Normal bladder neck - Bilateral ureteral orifices identified - multiple left lateral and posterior wall bladder tumors 2-3cm - No bladder stones - No trabeculation     Post-Procedure: - Patient tolerated the procedure well  Assessment/ Plan: Schedule for bladder tumor resection. Risks/benefits/alternatives discussed  No follow-ups on file.  Nicolette Bang, MD

## 2021-08-03 NOTE — Telephone Encounter (Signed)
Patient agrees with plan, will call back with bp readings in 1 week

## 2021-08-03 NOTE — Therapy (Signed)
[place], [place], [address] Phone: [phone]   Fax:  [phone]  Physical Therapy Treatment  Patient Details  Name: Jeremy Johnson MRN: [id number] Date of Birth: Aug 23, 1946 Referring Provider (PT): [name] MD   Encounter Date: 08/03/2021   PT End of Session - 08/03/21 1134     Visit Number 8    Number of Visits 20    Date for PT Re-Evaluation 08/10/21    Authorization Type Primary [employer] medicare (no vl, no auth), Secondary [employer] (vl 30, no auth)    Progress Note Due on Visit 15    PT Start Time 1134    PT Stop Time 1209    PT Time Calculation (min) 35 min    Activity Tolerance Patient tolerated treatment well    Behavior During Therapy WFL for tasks assessed/performed             Past Medical History:  Diagnosis Date   Anemia    Blood transfusion without reported diagnosis    CAD (coronary artery disease)    STENT... MID CIRCUMFLEX...1997   Chronic kidney disease    STAGE 3   COPD (chronic obstructive pulmonary disease) (HCC)    Degenerative joint disease (DJD) of lumbar spine    GERD (gastroesophageal reflux disease)    Gout    Hyperlipidemia    Hypertension    Hypothyroidism    Incisional hernia    abdomen   Leukocytosis    CHRONIC MILD   Myocardial infarction ([place])    1997   SCL CA dx'd 01/2019   Lung cancer    Past Surgical History:  Procedure Laterality Date   ABDOMINAL AORTIC ANEURYSM REPAIR  2006   AV FISTULA PLACEMENT Left 04/25/2019   Procedure: ARTERIOVENOUS (AV) FISTULA CREATION LEFT ARM;  Surgeon: Angelia Mould, MD;  Location: [facility];  Service: Vascular;  Laterality: Left;   AV FISTULA PLACEMENT Left 05/19/2019   Procedure: CONVERSION OF LEFT ARM ARTERIOVENOUS FISTULA TO GRAFT;  Surgeon: Angelia Mould, MD;  Location: [facility];  Service: Vascular;  Laterality: Left;   BIOPSY  09/30/2018   Procedure: BIOPSY;  Surgeon: Danie Binder,  MD;  [facility];  Service: Endoscopy;;  ascending colon   BIOPSY  07/27/2020   Procedure: BIOPSY;  Surgeon: Eloise Harman, DO;  [facility];  Service: Endoscopy;;  gastric   BUBBLE STUDY  09/23/2020   Procedure: BUBBLE STUDY;  Surgeon: Skeet Latch, MD;  Location: [facility];  Service: Cardiovascular;;   CARDIOVERSION N/A 09/23/2020   Procedure: CARDIOVERSION;  Surgeon: Skeet Latch, MD;  Location: [facility];  Service: Cardiovascular;  Laterality: N/A;   COLONOSCOPY  2008   COLONOSCOPY N/A 09/30/2018   External and internal hemorrhoids, six polyps removed, one ascending colon polypoid lesion biopsied. Six simple adenomas and one benign polypoid lesion. Colonoscopy Nov 2022.    COLONOSCOPY WITH PROPOFOL N/A 07/27/2020   non-bleeding internal hemorrhoids, sigmoid and descending colon diverticulosis, four 1-2 mm polyps in ascending colon, one 5 mm polyp in transverse colon. 3 year surveillance. Tubular adenomas.    CORONARY ANGIOPLASTY WITH STENT PLACEMENT  1997   MID CIRCUMFLEX   CORONARY STENT INTERVENTION N/A 09/20/2020   Procedure: CORONARY STENT INTERVENTION;  Surgeon: Martinique, Peter M, MD;  Location: [facility];  Service: Cardiovascular;  Laterality: N/A;   CYSTOSCOPY W/ RETROGRADES Bilateral 03/28/2021   Procedure: CYSTOSCOPY WITH RETROGRADE PYELOGRAM;  Surgeon: Nicolette Bang  L, MD;  [facility]: [facility];  Service: Urology;  Laterality: Bilateral;   ESOPHAGOGASTRODUODENOSCOPY (EGD) WITH PROPOFOL N/A 07/27/2020   Food in middle third of esophagus, gastritis s/p biopsy, nodular mucosa in lesser curvature of stomach s/p biopsy. Negative H.pylori.    HIP ARTHROPLASTY Right 04/30/2020   Procedure: ARTHROPLASTY  HIP (HEMIARTHROPLASTY);  Surgeon: Altamese Whetstone, MD;  Location: [facility];  Service: Orthopedics;  Laterality: Right;   INTRAVASCULAR ULTRASOUND/IVUS N/A 09/20/2020   Procedure: Intravascular Ultrasound/IVUS;  Surgeon: Martinique, Peter M, MD;   Location: [facility];  Service: Cardiovascular;  Laterality: N/A;   IR FLUORO GUIDE CV LINE RIGHT  04/22/2019   IR FLUORO GUIDE CV LINE RIGHT  05/01/2020   IR THORACENTESIS ASP PLEURAL SPACE W/IMG GUIDE  09/22/2020   IR THROMBECTOMY AV FISTULA W/THROMBOLYSIS/PTA INC/SHUNT/IMG LEFT Left 05/03/2020   IR US GUIDE VASC ACCESS LEFT  05/03/2020   IR US GUIDE VASC ACCESS RIGHT  04/22/2019   IR US GUIDE VASC ACCESS RIGHT  05/01/2020   POLYPECTOMY  09/30/2018   Procedure: POLYPECTOMY;  Surgeon: Danie Binder, MD;  [facility];  Service: Endoscopy;;  colon   POLYPECTOMY  07/27/2020   Procedure: POLYPECTOMY;  Surgeon: Eloise Harman, DO;  [facility];  Service: Endoscopy;;   RIGHT/LEFT HEART CATH AND CORONARY ANGIOGRAPHY N/A 09/20/2020   Procedure: RIGHT/LEFT HEART CATH AND CORONARY ANGIOGRAPHY;  Surgeon: Martinique, Peter M, MD;  Location: [facility];  Service: Cardiovascular;  Laterality: N/A;   TEE WITHOUT CARDIOVERSION N/A 09/23/2020   Procedure: TRANSESOPHAGEAL ECHOCARDIOGRAM (TEE);  Surgeon: Skeet Latch, MD;  Location: [facility];  Service: Cardiovascular;  Laterality: N/A;   TRANSURETHRAL RESECTION OF BLADDER TUMOR N/A 03/28/2021   Procedure: TRANSURETHRAL RESECTION OF BLADDER TUMOR (TURBT);  Surgeon: Cleon Gustin, MD;  [facility];  Service: Urology;  Laterality: N/A;   VIDEO BRONCHOSCOPY WITH ENDOBRONCHIAL NAVIGATION N/A 01/27/2019   Procedure: VIDEO BRONCHOSCOPY WITH ENDOBRONCHIAL NAVIGATION;  Surgeon: Grace Isaac, MD;  Location: [facility];  Service: Thoracic;  Laterality: N/A;   VIDEO BRONCHOSCOPY WITH ENDOBRONCHIAL ULTRASOUND N/A 01/27/2019   Procedure: VIDEO BRONCHOSCOPY WITH ENDOBRONCHIAL ULTRASOUND;  Surgeon: Grace Isaac, MD;  Location: [facility];  Service: Thoracic;  Laterality: N/A;    There were no vitals filed for this visit.   Subjective Assessment - 08/03/21 1136     Subjective Patient states he was feeling fine after last  session. Home exercises going alright.    Currently in Pain? No/denies                               [facility] Adult PT Treatment/Exercise - 08/03/21 0001       Knee/Hip Exercises: Standing   Hip Flexion Stengthening;Both;2 sets;10 reps;Knee bent    Hip Flexion Limitations alternating march, 3#    Forward Lunges Both;10 reps;2 sets    Lateral Step Up Both;2 sets;10 reps;Step Height: 6"    Lateral Step Up Limitations 3#    Other Standing Knee Exercises lunge transitions to/from 4 inch box with foam 1x 10 bilateral, 1x 10 just foam                     PT Education - 08/03/21 1136     Education Details HEP    Person(s) Educated Patient    Methods Explanation    Comprehension Verbalized understanding  PT Short Term Goals - 06/29/21 0943       PT SHORT TERM GOAL #1   Title Patient will be independent with HEP in order to improve functional outcomes.    Time 3    Period Weeks    Status On-going    Target Date 06/22/21      PT SHORT TERM GOAL #2   Title Patient will report at least 25% improvement in symptoms for improved quality of life.    Time 3    Period Weeks    Status On-going    Target Date 06/22/21               PT Long Term Goals - 06/29/21 0943       PT LONG TERM GOAL #1   Title Patient will report at least 75% improvement in symptoms for improved quality of life.    Time 6    Period Weeks    Status On-going      PT LONG TERM GOAL #2   Title Patient will be able to complete 5x STS in under 11.4 seconds in order to reduce the risk of falls.    Time 6    Period Weeks    Status On-going      PT LONG TERM GOAL #3   Title Patient will be able to ambulate at least 400 feet in 2MWT in order to demonstrate improved gait speed for community ambulation.    Time 6    Period Weeks    Status On-going      PT LONG TERM GOAL #4   Title Patient will score at least 19/24 on DGI in order to demonstrate decreased fall  risk.    Time 6    Period Weeks    Status On-going                   Plan - 08/03/21 1135     Clinical Impression Statement Patient demonstrating improving dynamic balance but continues to require HHA intermittently. Patient requires cueing for rest breaks and reps due to tendency to continue performing exercise. Patient beginning with lunge transitions today for functional strength. Initially began with 8 inch box with foam but patient completes with ease and is able to progress to 4 inch step and then just foam. Patient with greater difficulty performing on RLE and is limited by fatigue limiting session. Patient will continue to benefit from skilled physical therapy in order to reduce impairment and improve function.    Personal Factors and Comorbidities Age;Fitness;Past/Current Experience;Comorbidity 3+;Time since onset of injury/illness/exacerbation    Comorbidities hx cancer, hx R hip fracture, falls    Examination-Activity Limitations Locomotion Level;Transfers;Stand;Stairs;Squat;Lift;Carry    Examination-Participation Restrictions Meal Prep;Cleaning;Community Activity;Shop;Volunteer;Yard Work    Stability/Clinical Decision Making Stable/Uncomplicated    Rehab Potential Good    PT Frequency 2x / week    PT Duration 4 weeks    PT Treatment/Interventions ADLs/Self Care Home Management;Aquatic Therapy;Electrical Stimulation;Iontophoresis 4mg /ml Dexamethasone;Moist Heat;Traction;DME Instruction;Gait training;Stair training;Functional mobility training;Therapeutic activities;Therapeutic exercise;Balance training;Neuromuscular re-education;Cognitive remediation;Patient/family education;Orthotic Fit/Training;Manual techniques;Compression bandaging;Passive range of motion;Dry needling;Energy conservation;Splinting;Taping    PT Next Visit Plan continue functional strength training, balance and gait training, evenually work on standing<> floor transfers    PT Home Exercise Plan STS, standing  marching; 8/22 - heel/toe raise, hip ext/abd 8/24 tandem stance; 9/14 narrow BOS on foam with head turns    Consulted and Agree with Plan of Care Patient  Patient will benefit from skilled therapeutic intervention in order to improve the following deficits and impairments:  Abnormal gait, Difficulty walking, Decreased endurance, Decreased activity tolerance, Decreased balance, Impaired flexibility, Improper body mechanics, Decreased strength, Decreased mobility  Visit Diagnosis: Other abnormalities of gait and mobility  Muscle weakness (generalized)  Other symptoms and signs involving the musculoskeletal system     Problem List Patient Active Problem List   Diagnosis Date Noted   Malignant neoplasm of overlapping sites of bladder ([place]) 04/12/2021   Bladder tumor 12/07/2020   Gross hematuria 12/07/2020   Elevated LFTs 09/29/2020   Dysphagia 09/29/2020   A-fib ([place]) 09/22/2020   Palliative care by specialist    DNR (do not resuscitate) discussion    Acute systolic CHF (congestive heart failure) ([place])    Restless leg syndrome 09/17/2020   Elevated troponin I level 09/17/2020   Loss of weight 06/09/2020   Early satiety 06/09/2020   Closed displaced fracture of right femoral neck with delayed healing 04/29/2020   Closed displaced fracture of right femoral neck ([place]) 04/29/2020   Acute respiratory failure with hypoxia ([place]) 04/06/2020   Acute and chronic respiratory failure with hypoxia ([place]) 04/05/2020   Chronic obstructive pulmonary disease/emphysema    Anaphylactic reaction due to adverse effect of correct drug or medicament properly administered, initial encounter 07/30/2019   Hypocalcemia 05/31/2019   Other disorders of phosphorus metabolism 05/31/2019   Pancytopenia ([place]) 05/22/2019   ESRD (end stage renal disease) on dialysis ([place]) 05/22/2019   Unspecified protein-calorie malnutrition ([place]) 05/02/2019   Coagulation defect, unspecified ([place]) 04/26/2019    Diarrhea, unspecified 04/26/2019   Hypokalemia 04/26/2019   Pain, unspecified 04/26/2019   Pruritus, unspecified 04/26/2019   Secondary hyperparathyroidism of renal origin ([place]) 04/26/2019   Encounter for immunization 04/26/2019   Incisional hernia without obstruction or gangrene 04/25/2019   Malignant neoplasm of unspecified part of unspecified bronchus or lung ([place]) 04/25/2019   Other specified degenerative diseases of nervous system ([place]) 04/25/2019   Anemia in chronic kidney disease 04/25/2019   Atherosclerotic heart disease of native coronary artery without angina pectoris 04/25/2019   Gastro-esophageal reflux disease without esophagitis 04/25/2019   Hypothyroidism, unspecified 04/25/2019   Acute renal failure superimposed on stage 3 chronic kidney disease ([place]) 04/16/2019   Anemia in chronic kidney disease (CKD) 04/14/2019   Small cell lung cancer ([place]) 02/06/2019   Encounter for antineoplastic chemotherapy 02/06/2019   Goals of care, counseling/discussion 02/06/2019   Special screening for malignant neoplasms, colon    GERD (gastroesophageal reflux disease)    Hyperlipidemia    Gout    Hypothyroidism    Degenerative joint disease (DJD) of lumbar spine    Acquired hypothyroidism 02/02/2010   HLD (hyperlipidemia) 02/02/2010   Essential hypertension 02/02/2010   GERD 02/02/2010   ABDOMINAL AORTIC ANEURYSM REPAIR, HX OF 02/02/2010   12:10 PM, 08/03/21 Mearl Latin PT, DPT Physical Therapist at [place], [place], [address] Phone: [phone]   Fax:  [phone]  Name: JADON HARBAUGH MRN: [id number] Date of Birth: September 14, 1946

## 2021-08-04 ENCOUNTER — Telehealth: Payer: Self-pay | Admitting: Cardiology

## 2021-08-04 DIAGNOSIS — Z23 Encounter for immunization: Secondary | ICD-10-CM | POA: Diagnosis not present

## 2021-08-04 DIAGNOSIS — D689 Coagulation defect, unspecified: Secondary | ICD-10-CM | POA: Diagnosis not present

## 2021-08-04 DIAGNOSIS — Z992 Dependence on renal dialysis: Secondary | ICD-10-CM | POA: Diagnosis not present

## 2021-08-04 DIAGNOSIS — R52 Pain, unspecified: Secondary | ICD-10-CM | POA: Diagnosis not present

## 2021-08-04 DIAGNOSIS — N2581 Secondary hyperparathyroidism of renal origin: Secondary | ICD-10-CM | POA: Diagnosis not present

## 2021-08-04 DIAGNOSIS — N186 End stage renal disease: Secondary | ICD-10-CM | POA: Diagnosis not present

## 2021-08-04 NOTE — Progress Notes (Signed)
Surgical clearance sent via fax to Dr. Harl Bowie for eliquis and plavix hold.

## 2021-08-04 NOTE — Telephone Encounter (Signed)
    Patient Name: Jeremy Johnson  DOB: 06-06-1946 MRN: [id number]  Primary Cardiologist: Carlyle Dolly, MD  Chart reviewed as part of pre-operative protocol coverage.   Dr. Harl Bowie, you saw the patient on July 08, 2021.  He was doing well at that time.  Started on losartan yesterday for elevated blood pressure.  Per office visit note, "1. CAD - no recent symptoms - can stop plavix after 09/2021 marking one year since his stents".  Patient's procedure is on October 17.  Can you give your Plavix recommendations? Pharmacy to review anticoagulation.

## 2021-08-04 NOTE — Telephone Encounter (Signed)
Letter by Dorisann Frames, RN on 08/04/2021      [facility] [address] [place], [place]  [address] Phone:  [phone]   Fax:  [phone]                                                                                          08/04/2021   Patient: Jeremy Johnson DOB October 29, 1946 MRN [id number]     Dr. Harl Bowie,   The above stated patient is having cystoscopy with transurethral resection bladder tumor procedure performed at [facility] on October 17th. Dr. Nicolette Bang will perform this procedure using general anesthesia . Dr.McKenzie  is asking for cardiology clearance and for approval for patient to hold Eliquis and Plavix prior to surgery. Eliquis is 2 and Plavix is 5 days before the scheduled procedure. He will need to hold eliquis and plavix 3 days after Please send back supporting documentation of this approval request.   Thank you, Estill Bamberg RN Dr. Alyson Ingles

## 2021-08-05 NOTE — Telephone Encounter (Signed)
Patient with diagnosis of A Fib on Eliquis for anticoagulation.    Procedure: cystoscopy with transurethral resection bladder tumor procedure  Date of procedure: 08/29/21  CHA2DS2-VASc Score = 5  This indicates a 7.2% annual risk of stroke. The patient's score is based upon: CHF History: 1 HTN History: 1 Diabetes History: 1 Stroke History: 0 Vascular Disease History: 1 Age Score: 1 Gender Score: 0    CrCl  9 mL/min Platelet count 202K  Patient with ESRD.  Ok to hold Eliquis for 3 days prior to procedure.  Holding for 3 days after procedure as well will require cardiologist approval.

## 2021-08-06 DIAGNOSIS — N186 End stage renal disease: Secondary | ICD-10-CM | POA: Diagnosis not present

## 2021-08-06 DIAGNOSIS — Z992 Dependence on renal dialysis: Secondary | ICD-10-CM | POA: Diagnosis not present

## 2021-08-06 DIAGNOSIS — N2581 Secondary hyperparathyroidism of renal origin: Secondary | ICD-10-CM | POA: Diagnosis not present

## 2021-08-06 DIAGNOSIS — D689 Coagulation defect, unspecified: Secondary | ICD-10-CM | POA: Diagnosis not present

## 2021-08-06 DIAGNOSIS — R52 Pain, unspecified: Secondary | ICD-10-CM | POA: Diagnosis not present

## 2021-08-06 DIAGNOSIS — Z23 Encounter for immunization: Secondary | ICD-10-CM | POA: Diagnosis not present

## 2021-08-08 ENCOUNTER — Ambulatory Visit (HOSPITAL_COMMUNITY): Payer: Medicare Other | Admitting: Physical Therapy

## 2021-08-08 NOTE — Telephone Encounter (Signed)
Covering preop today. As below, awaiting input from Dr. Harl Bowie re: Plavix question from [place] and [place] question from pharmacy.

## 2021-08-09 DIAGNOSIS — R52 Pain, unspecified: Secondary | ICD-10-CM | POA: Diagnosis not present

## 2021-08-09 DIAGNOSIS — N186 End stage renal disease: Secondary | ICD-10-CM | POA: Diagnosis not present

## 2021-08-09 DIAGNOSIS — D689 Coagulation defect, unspecified: Secondary | ICD-10-CM | POA: Diagnosis not present

## 2021-08-09 DIAGNOSIS — Z23 Encounter for immunization: Secondary | ICD-10-CM | POA: Diagnosis not present

## 2021-08-09 DIAGNOSIS — N2581 Secondary hyperparathyroidism of renal origin: Secondary | ICD-10-CM | POA: Diagnosis not present

## 2021-08-09 DIAGNOSIS — Z992 Dependence on renal dialysis: Secondary | ICD-10-CM | POA: Diagnosis not present

## 2021-08-09 NOTE — Telephone Encounter (Signed)
    Patient Name: Jeremy Johnson  DOB: 1946/05/09 MRN: [id number]  Primary Cardiologist: Carlyle Dolly, MD  Chart reviewed as part of pre-operative protocol coverage. Given past medical history and time since last visit, based on ACC/AHA guidelines, [name] would be at acceptable risk for the planned procedure without further cardiovascular testing.   Patient with diagnosis of A Fib on Eliquis for anticoagulation.     Procedure: cystoscopy with transurethral resection bladder tumor procedure  Date of procedure: 08/29/21   CHA2DS2-VASc Score = 5  This indicates a 7.2% annual risk of stroke. The patient's score is based upon: CHF History: 1 HTN History: 1 Diabetes History: 1 Stroke History: 0 Vascular Disease History: 1 Age Score: 1 Gender Score: 0    CrCl  9 mL/min Platelet count 202K   Patient with ESRD. Ok to hold Eliquis for 3 days prior to procedure.  However holding 3 days after the procedure required cardiologist approval. This was reviewed by Dr. Harl Bowie who stated that it had not been a year since last stent placement but given indication for surgery and being 9 months out from stent intervention, it will be reasonable to hold Plavix as requested. Can also hold eliquis as requested. Resume both 3 days after procedure.   I will route this recommendation to the requesting party via Epic fax function and remove from pre-op pool.  Please call with questions.  Kathyrn Drown, NP 08/09/2021, 4:22 PM

## 2021-08-09 NOTE — Telephone Encounter (Signed)
Has not been a year since last stent but given indication for surgery and being 9 months out reasonable to hold plavix as requested. Can also hold eliquis as requested. Resume both after procedure, from there note 3 days after which is ok   Zandra Abts MD

## 2021-08-10 ENCOUNTER — Other Ambulatory Visit: Payer: Self-pay

## 2021-08-10 ENCOUNTER — Encounter (HOSPITAL_COMMUNITY): Payer: Self-pay | Admitting: Physical Therapy

## 2021-08-10 ENCOUNTER — Ambulatory Visit (HOSPITAL_COMMUNITY): Payer: Medicare Other | Admitting: Physical Therapy

## 2021-08-10 DIAGNOSIS — M6281 Muscle weakness (generalized): Secondary | ICD-10-CM | POA: Diagnosis not present

## 2021-08-10 DIAGNOSIS — R2689 Other abnormalities of gait and mobility: Secondary | ICD-10-CM | POA: Diagnosis not present

## 2021-08-10 DIAGNOSIS — R29898 Other symptoms and signs involving the musculoskeletal system: Secondary | ICD-10-CM

## 2021-08-10 NOTE — Therapy (Signed)
[place], [place], [address] Phone: [phone]   Fax:  [phone]  Physical Therapy Treatment/Discharge Summary  Patient Details  Name: Jeremy Johnson MRN: [id number] Date of Birth: 03-Dec-1945 Referring Provider (PT): [name] MD   Encounter Date: 08/10/2021  PHYSICAL THERAPY DISCHARGE SUMMARY  Visits from Start of Care: 9  Current functional level related to goals / functional outcomes: See below   Remaining deficits: See below   Education / Equipment: See below   Patient agrees to discharge. Patient goals were partially met. Patient is being discharged due to maximized rehab potential.     PT End of Session - 08/10/21 1049     Visit Number 9    Number of Visits 20    Date for PT Re-Evaluation 08/10/21    Authorization Type Primary [employer] medicare (no vl, no auth), Secondary [employer] (vl 30, no auth)    Progress Note Due on Visit 15    PT Start Time 1049    PT Stop Time 1108    PT Time Calculation (min) 19 min    Activity Tolerance Patient tolerated treatment well    Behavior During Therapy WFL for tasks assessed/performed             Past Medical History:  Diagnosis Date   Anemia    Blood transfusion without reported diagnosis    CAD (coronary artery disease)    STENT... MID CIRCUMFLEX...1997   Chronic kidney disease    STAGE 3   COPD (chronic obstructive pulmonary disease) (HCC)    Degenerative joint disease (DJD) of lumbar spine    GERD (gastroesophageal reflux disease)    Gout    Hyperlipidemia    Hypertension    Hypothyroidism    Incisional hernia    abdomen   Leukocytosis    CHRONIC MILD   Myocardial infarction ([place])    1997   SCL CA dx'd 01/2019   Lung cancer    Past Surgical History:  Procedure Laterality Date   ABDOMINAL AORTIC ANEURYSM REPAIR  2006   AV FISTULA PLACEMENT Left 04/25/2019   Procedure: ARTERIOVENOUS (AV) FISTULA CREATION LEFT  ARM;  Surgeon: Angelia Mould, MD;  Location: [facility];  Service: Vascular;  Laterality: Left;   AV FISTULA PLACEMENT Left 05/19/2019   Procedure: CONVERSION OF LEFT ARM ARTERIOVENOUS FISTULA TO GRAFT;  Surgeon: Angelia Mould, MD;  Location: [facility];  Service: Vascular;  Laterality: Left;   BIOPSY  09/30/2018   Procedure: BIOPSY;  Surgeon: Danie Binder, MD;  [facility];  Service: Endoscopy;;  ascending colon   BIOPSY  07/27/2020   Procedure: BIOPSY;  Surgeon: Eloise Harman, DO;  [facility];  Service: Endoscopy;;  gastric   BUBBLE STUDY  09/23/2020   Procedure: BUBBLE STUDY;  Surgeon: Skeet Latch, MD;  Location: [facility];  Service: Cardiovascular;;   CARDIOVERSION N/A 09/23/2020   Procedure: CARDIOVERSION;  Surgeon: Skeet Latch, MD;  Location: [facility];  Service: Cardiovascular;  Laterality: N/A;   COLONOSCOPY  2008   COLONOSCOPY N/A 09/30/2018   External and internal hemorrhoids, six polyps removed, one ascending colon polypoid lesion biopsied. Six simple adenomas and one benign polypoid lesion. Colonoscopy Nov 2022.    COLONOSCOPY WITH PROPOFOL N/A 07/27/2020   non-bleeding internal hemorrhoids, sigmoid and descending colon diverticulosis, four 1-2 mm polyps in ascending colon, one 5 mm polyp in transverse colon. 3 year surveillance. Tubular adenomas.  CORONARY ANGIOPLASTY WITH STENT PLACEMENT  1997   MID CIRCUMFLEX   CORONARY STENT INTERVENTION N/A 09/20/2020   Procedure: CORONARY STENT INTERVENTION;  Surgeon: Martinique, Peter M, MD;  Location: [facility];  Service: Cardiovascular;  Laterality: N/A;   CYSTOSCOPY W/ RETROGRADES Bilateral 03/28/2021   Procedure: CYSTOSCOPY WITH RETROGRADE PYELOGRAM;  Surgeon: Cleon Gustin, MD;  [facility];  Service: Urology;  Laterality: Bilateral;   ESOPHAGOGASTRODUODENOSCOPY (EGD) WITH PROPOFOL N/A 07/27/2020   Food in middle third of esophagus, gastritis s/p biopsy, nodular mucosa  in lesser curvature of stomach s/p biopsy. Negative H.pylori.    HIP ARTHROPLASTY Right 04/30/2020   Procedure: ARTHROPLASTY  HIP (HEMIARTHROPLASTY);  Surgeon: Altamese Guthrie, MD;  Location: [facility];  Service: Orthopedics;  Laterality: Right;   INTRAVASCULAR ULTRASOUND/IVUS N/A 09/20/2020   Procedure: Intravascular Ultrasound/IVUS;  Surgeon: Martinique, Peter M, MD;  Location: [facility];  Service: Cardiovascular;  Laterality: N/A;   IR FLUORO GUIDE CV LINE RIGHT  04/22/2019   IR FLUORO GUIDE CV LINE RIGHT  05/01/2020   IR THORACENTESIS ASP PLEURAL SPACE W/IMG GUIDE  09/22/2020   IR THROMBECTOMY AV FISTULA W/THROMBOLYSIS/PTA INC/SHUNT/IMG LEFT Left 05/03/2020   IR US GUIDE VASC ACCESS LEFT  05/03/2020   IR US GUIDE VASC ACCESS RIGHT  04/22/2019   IR US GUIDE VASC ACCESS RIGHT  05/01/2020   POLYPECTOMY  09/30/2018   Procedure: POLYPECTOMY;  Surgeon: Danie Binder, MD;  [facility];  Service: Endoscopy;;  colon   POLYPECTOMY  07/27/2020   Procedure: POLYPECTOMY;  Surgeon: Eloise Harman, DO;  [facility];  Service: Endoscopy;;   RIGHT/LEFT HEART CATH AND CORONARY ANGIOGRAPHY N/A 09/20/2020   Procedure: RIGHT/LEFT HEART CATH AND CORONARY ANGIOGRAPHY;  Surgeon: Martinique, Peter M, MD;  Location: [facility];  Service: Cardiovascular;  Laterality: N/A;   TEE WITHOUT CARDIOVERSION N/A 09/23/2020   Procedure: TRANSESOPHAGEAL ECHOCARDIOGRAM (TEE);  Surgeon: Skeet Latch, MD;  Location: [facility];  Service: Cardiovascular;  Laterality: N/A;   TRANSURETHRAL RESECTION OF BLADDER TUMOR N/A 03/28/2021   Procedure: TRANSURETHRAL RESECTION OF BLADDER TUMOR (TURBT);  Surgeon: Cleon Gustin, MD;  [facility];  Service: Urology;  Laterality: N/A;   VIDEO BRONCHOSCOPY WITH ENDOBRONCHIAL NAVIGATION N/A 01/27/2019   Procedure: VIDEO BRONCHOSCOPY WITH ENDOBRONCHIAL NAVIGATION;  Surgeon: Grace Isaac, MD;  Location: [facility];  Service: Thoracic;  Laterality: N/A;   VIDEO  BRONCHOSCOPY WITH ENDOBRONCHIAL ULTRASOUND N/A 01/27/2019   Procedure: VIDEO BRONCHOSCOPY WITH ENDOBRONCHIAL ULTRASOUND;  Surgeon: Grace Isaac, MD;  Location: [facility];  Service: Thoracic;  Laterality: N/A;    There were no vitals filed for this visit.   Subjective Assessment - 08/10/21 1050     Subjective His home exercises are going well. No falls. Shoulders were really bothering him the other day. Patient states 50% improvement with PT intervention. He continues to sometimes have trouble with walking which may be due to balance problem vs. weakness.    Currently in Pain? No/denies                [facility] PT Assessment - 08/10/21 0001       Assessment   Medical Diagnosis Falls    Referring Provider (PT) [name] MD    Onset Date/Surgical Date 12/02/20    Next MD Visit December      Precautions   Precautions Fall      Restrictions   Weight Bearing Restrictions No  Balance Screen   Has the patient fallen in the past 6 months Yes    How many times? 3    Has the patient had a decrease in activity level because of a fear of falling?  Yes    Is the patient reluctant to leave their home because of a fear of falling?  No      Prior Function   Level of Independence Independent      Cognition   Overall Cognitive Status Within Functional Limits for tasks assessed      Observation/Other Assessments   Observations Ambulates without AD      Transfers   Five time sit to stand comments  12.95 seconds without UE use      Ambulation/Gait   Ambulation/Gait Yes    Ambulation/Gait Assistance 6: Modified independent (Device/Increase time)    Ambulation Distance (Feet) 375 Feet    Assistive device None    Gait Pattern Step-through pattern    Gait Comments 2MWT      Dynamic Gait Index   Level Surface Mild Impairment    Change in Gait Speed Mild Impairment    Gait with Horizontal Head Turns Mild Impairment    Gait with Vertical Head Turns Mild Impairment    Gait and  Pivot Turn Mild Impairment    Step Over Obstacle Mild Impairment    Step Around Obstacles Mild Impairment    Steps Mild Impairment    Total Score 16    DGI comment: increased risk for falls                                    PT Education - 08/10/21 1050     Education Details HEP, reassessment findings    Person(s) Educated Patient    Methods Explanation    Comprehension Verbalized understanding              PT Short Term Goals - 08/10/21 1054       PT SHORT TERM GOAL #1   Title Patient will be independent with HEP in order to improve functional outcomes.    Time 3    Period Weeks    Status Achieved    Target Date 06/22/21      PT SHORT TERM GOAL #2   Title Patient will report at least 25% improvement in symptoms for improved quality of life.    Time 3    Period Weeks    Status Achieved    Target Date 06/22/21               PT Long Term Goals - 08/10/21 1055       PT LONG TERM GOAL #1   Title Patient will report at least 75% improvement in symptoms for improved quality of life.    Time 6    Period Weeks    Status Not Met      PT LONG TERM GOAL #2   Title Patient will be able to complete 5x STS in under 11.4 seconds in order to reduce the risk of falls.    Time 6    Period Weeks    Status Not Met      PT LONG TERM GOAL #3   Title Patient will be able to ambulate at least 400 feet in 2MWT in order to demonstrate improved gait speed for community ambulation.    Time 6    Period Weeks  Status Not Met      PT LONG TERM GOAL #4   Title Patient will score at least 19/24 on DGI in order to demonstrate decreased fall risk.    Time 6    Period Weeks    Status Not Met                   Plan - 08/10/21 1050     Clinical Impression Statement Patient has met short term goals with improvement in symptoms and ability to complete HEP. He has not met any long term goals with continued balance, gait, and functional mobility  deficits. Patient showing improving strength and functional mobility but continues to show greatest deficit with balance. Patient educated on continuing HEP and returning to PT if needed. Patient discharged from physical therapy at this time.    Personal Factors and Comorbidities Age;Fitness;Past/Current Experience;Comorbidity 3+;Time since onset of injury/illness/exacerbation    Comorbidities hx cancer, hx R hip fracture, falls    Examination-Activity Limitations Locomotion Level;Transfers;Stand;Stairs;Squat;Lift;Carry    Examination-Participation Restrictions Meal Prep;Cleaning;Community Activity;Shop;Volunteer;Yard Work    Stability/Clinical Decision Making Stable/Uncomplicated    Rehab Potential Good    PT Frequency --    PT Duration --    PT Treatment/Interventions ADLs/Self Care Home Management;Aquatic Therapy;Electrical Stimulation;Iontophoresis 37m/ml Dexamethasone;Moist Heat;Traction;DME Instruction;Gait training;Stair training;Functional mobility training;Therapeutic activities;Therapeutic exercise;Balance training;Neuromuscular re-education;Cognitive remediation;Patient/family education;Orthotic Fit/Training;Manual techniques;Compression bandaging;Passive range of motion;Dry needling;Energy conservation;Splinting;Taping    PT Next Visit Plan n/a    PT Home Exercise Plan STS, standing marching; 8/22 - heel/toe raise, hip ext/abd 8/24 tandem stance; 9/14 narrow BOS on foam with head turns    Consulted and Agree with Plan of Care Patient             Patient will benefit from skilled therapeutic intervention in order to improve the following deficits and impairments:  Abnormal gait, Difficulty walking, Decreased endurance, Decreased activity tolerance, Decreased balance, Impaired flexibility, Improper body mechanics, Decreased strength, Decreased mobility  Visit Diagnosis: Other abnormalities of gait and mobility  Muscle weakness (generalized)  Other symptoms and signs involving the  musculoskeletal system     Problem List Patient Active Problem List   Diagnosis Date Noted   Malignant neoplasm of overlapping sites of bladder ([place] 04/12/2021   Bladder tumor 12/07/2020   Gross hematuria 12/07/2020   Elevated LFTs 09/29/2020   Dysphagia 09/29/2020   A-fib ([place] 09/22/2020   Palliative care by specialist    DNR (do not resuscitate) discussion    Acute systolic CHF (congestive heart failure) ([facility]    Restless leg syndrome 09/17/2020   Elevated troponin I level 09/17/2020   Loss of weight 06/09/2020   Early satiety 06/09/2020   Closed displaced fracture of right femoral neck with delayed healing 04/29/2020   Closed displaced fracture of right femoral neck ([facility] 04/29/2020   Acute respiratory failure with hypoxia ([facility] 04/06/2020   Acute and chronic respiratory failure with hypoxia ([facility] 04/05/2020   Chronic obstructive pulmonary disease/emphysema    Anaphylactic reaction due to adverse effect of correct drug or medicament properly administered, initial encounter 07/30/2019   Hypocalcemia 05/31/2019   Other disorders of phosphorus metabolism 05/31/2019   Pancytopenia ([facility] 05/22/2019   ESRD (end stage renal disease) on dialysis ([facility] 05/22/2019   Unspecified protein-calorie malnutrition ([place] 05/02/2019   Coagulation defect, unspecified ([facility] 04/26/2019   Diarrhea, unspecified 04/26/2019   Hypokalemia 04/26/2019   Pain, unspecified 04/26/2019   Pruritus, unspecified 04/26/2019   Secondary hyperparathyroidism of renal origin ([place] 04/26/2019   Encounter  for immunization 04/26/2019   Incisional hernia without obstruction or gangrene 04/25/2019   Malignant neoplasm of unspecified part of unspecified bronchus or lung ([place]) 04/25/2019   Other specified degenerative diseases of nervous system ([place]) 04/25/2019   Anemia in chronic kidney disease 04/25/2019   Atherosclerotic heart disease of native coronary artery without angina pectoris 04/25/2019    Gastro-esophageal reflux disease without esophagitis 04/25/2019   Hypothyroidism, unspecified 04/25/2019   Acute renal failure superimposed on stage 3 chronic kidney disease ([place]) 04/16/2019   Anemia in chronic kidney disease (CKD) 04/14/2019   Small cell lung cancer ([place]) 02/06/2019   Encounter for antineoplastic chemotherapy 02/06/2019   Goals of care, counseling/discussion 02/06/2019   Special screening for malignant neoplasms, colon    GERD (gastroesophageal reflux disease)    Hyperlipidemia    Gout    Hypothyroidism    Degenerative joint disease (DJD) of lumbar spine    Acquired hypothyroidism 02/02/2010   HLD (hyperlipidemia) 02/02/2010   Essential hypertension 02/02/2010   GERD 02/02/2010   ABDOMINAL AORTIC ANEURYSM REPAIR, HX OF 02/02/2010    11:18 AM, 08/10/21 Mearl Latin PT, DPT Physical Therapist at [place], [place], [address] Phone: [phone]   Fax:  [phone]  Name: TINY RIETZ MRN: [id number] Date of Birth: [birth year]

## 2021-08-11 ENCOUNTER — Telehealth: Payer: Self-pay | Admitting: Cardiology

## 2021-08-11 DIAGNOSIS — N2581 Secondary hyperparathyroidism of renal origin: Secondary | ICD-10-CM | POA: Diagnosis not present

## 2021-08-11 DIAGNOSIS — Z992 Dependence on renal dialysis: Secondary | ICD-10-CM | POA: Diagnosis not present

## 2021-08-11 DIAGNOSIS — D689 Coagulation defect, unspecified: Secondary | ICD-10-CM | POA: Diagnosis not present

## 2021-08-11 DIAGNOSIS — R52 Pain, unspecified: Secondary | ICD-10-CM | POA: Diagnosis not present

## 2021-08-11 DIAGNOSIS — Z23 Encounter for immunization: Secondary | ICD-10-CM | POA: Diagnosis not present

## 2021-08-11 DIAGNOSIS — N186 End stage renal disease: Secondary | ICD-10-CM | POA: Diagnosis not present

## 2021-08-11 NOTE — Telephone Encounter (Signed)
Since starting new BP medication his readings are :  9/22 148/72  9/23 157/80 9/24 157/77 9/25 153/76 9/26 162/81 9/27 123/66 after dialysis 9/28 148/77 9/29 127/71 after dialysis

## 2021-08-12 ENCOUNTER — Encounter: Payer: Self-pay | Admitting: Urology

## 2021-08-12 DIAGNOSIS — I129 Hypertensive chronic kidney disease with stage 1 through stage 4 chronic kidney disease, or unspecified chronic kidney disease: Secondary | ICD-10-CM | POA: Diagnosis not present

## 2021-08-12 DIAGNOSIS — Z992 Dependence on renal dialysis: Secondary | ICD-10-CM | POA: Diagnosis not present

## 2021-08-12 DIAGNOSIS — N186 End stage renal disease: Secondary | ICD-10-CM | POA: Diagnosis not present

## 2021-08-12 NOTE — Patient Instructions (Signed)
Bladder Cancer Bladder cancer is a condition in which abnormal tissue (a tumor) grows in the bladder. The bladder is the organ that holds urine. Two tubes (ureters) carry the urine from the kidneys to the bladder. The bladder wall is made of layers of tissue. Cancer that spreads through these layers of the bladder wall becomes more difficult to treat. What are the causes? The cause of this condition is not known. What increases the risk? The following factors may make you more likely to develop this condition: Smoking. Working where there are risks (occupational exposures), such as working with rubber, leather, clothing fabric, dyes, chemicals, and paint. Being 55 years of age or older. Being male. Having bladder inflammation that is long-term (chronic). Having a history of cancer, including: A family history of bladder cancer. Personal experience with bladder cancer. Having had certain treatments for cancer before. These include: Medicines to kill cancer cells (chemotherapy). Strong X-ray beams or capsules high in energy to kill cancer cells and shrink tumors (radiation therapy). Having been exposed to arsenic. This is a chemical element that can poison you. What are the signs or symptoms? Early symptoms of this condition include: Seeing blood in your urine. Feeling pain when urinating. Having infections of your urinary system (urinary tract infections or UTIs) that happen often. Having to urinate sooner or more often than usual. Later symptoms of this condition include: Not being able to urinate. Pain on one side of your lower back. Loss of appetite. Weight loss. Tiredness (fatigue). Swelling in your feet. Bone pain. How is this diagnosed? This condition is diagnosed based on: Your medical history. A physical exam. Lab tests, such as urine tests. Imaging tests. Your symptoms. You may also have other tests or procedures done, such as: A cystoscopy. A narrow tube is inserted  into your bladder through the organ that connects your bladder to the outside of your body (urethra). This is done to view the lining of your bladder for tumors. A biopsy. This procedure involves removing a tissue sample to look at it under a microscope to see if cancer is present. It is important to find out: How deeply into the bladder wall cancer has grown. Whether cancer has spread to any other parts of your body. This may require blood tests or imaging tests, such as a CT scan, MRI, bone scan, or X-rays. How is this treated? Your health care provider may recommend one or more types of treatment based on the stage of your cancer. The most common types of treatment are: Surgery to remove the cancer. Procedures that may be done include: Removing a tumor on the inside wall of the bladder (transurethral resection). Removing the bladder (cystectomy). Radiation therapy. This is often used together with chemotherapy. Chemotherapy. Immunotherapy. This uses medicines to help your immune system destroy cancer cells. Follow these instructions at home: Take over-the-counter and prescription medicines only as told by your health care provider. Eat a healthy diet. Some of your treatments might affect your appetite. Do not use any products that contain nicotine or tobacco, such as cigarettes, e-cigarettes, and chewing tobacco. If you need help quitting, ask your health care provider. Consider joining a support group. This may help you learn to cope with the stress of having bladder cancer. Tell your cancer care team if you develop side effects. Your team may be able to recommend ways to get relief. Keep all follow-up visits as told by your health care provider. This is important. Where to find more information American   Cancer Society: www.cancer.org National Cancer Institute (NCI): www.cancer.gov Contact a health care provider if: You have symptoms of a urinary tract infection. These  include: Fever. Chills. Weakness. Muscle aches. Pain in your abdomen. Urge to urinate that is stronger and happens more often than usual. Burning feeling in the bladder or urethra when you urinate. Get help right away if: There is blood in your urine. You cannot urinate. You have severe pain or other symptoms that do not go away. Summary Bladder cancer is a condition in which tumors grow in the bladder and cause illness. This condition is diagnosed based on your medical history, a physical exam, lab tests, imaging tests, and your symptoms. Your health care provider may recommend one or more types of treatment based on the stage of your cancer. Consider joining a support group. This may help you learn to cope with the stress of having bladder cancer. This information is not intended to replace advice given to you by your health care provider. Make sure you discuss any questions you have with your health care provider. Document Revised: 07/09/2019 Document Reviewed: 07/09/2019 Elsevier Patient Education  2022 Elsevier Inc.  

## 2021-08-13 DIAGNOSIS — D689 Coagulation defect, unspecified: Secondary | ICD-10-CM | POA: Diagnosis not present

## 2021-08-13 DIAGNOSIS — D631 Anemia in chronic kidney disease: Secondary | ICD-10-CM | POA: Diagnosis not present

## 2021-08-13 DIAGNOSIS — E039 Hypothyroidism, unspecified: Secondary | ICD-10-CM | POA: Diagnosis not present

## 2021-08-13 DIAGNOSIS — N2581 Secondary hyperparathyroidism of renal origin: Secondary | ICD-10-CM | POA: Diagnosis not present

## 2021-08-13 DIAGNOSIS — Z992 Dependence on renal dialysis: Secondary | ICD-10-CM | POA: Diagnosis not present

## 2021-08-13 DIAGNOSIS — N186 End stage renal disease: Secondary | ICD-10-CM | POA: Diagnosis not present

## 2021-08-13 DIAGNOSIS — R52 Pain, unspecified: Secondary | ICD-10-CM | POA: Diagnosis not present

## 2021-08-16 ENCOUNTER — Emergency Department (HOSPITAL_COMMUNITY)
Admission: EM | Admit: 2021-08-16 | Discharge: 2021-08-16 | Disposition: A | Discharge status: Home / Self Care | Payer: Medicare Other | Attending: Emergency Medicine | Admitting: Emergency Medicine

## 2021-08-16 ENCOUNTER — Other Ambulatory Visit: Payer: Self-pay

## 2021-08-16 ENCOUNTER — Encounter (HOSPITAL_COMMUNITY): Payer: Self-pay | Admitting: Emergency Medicine

## 2021-08-16 DIAGNOSIS — Z85118 Personal history of other malignant neoplasm of bronchus and lung: Secondary | ICD-10-CM | POA: Diagnosis not present

## 2021-08-16 DIAGNOSIS — Z96641 Presence of right artificial hip joint: Secondary | ICD-10-CM | POA: Diagnosis not present

## 2021-08-16 DIAGNOSIS — Z8551 Personal history of malignant neoplasm of bladder: Secondary | ICD-10-CM | POA: Insufficient documentation

## 2021-08-16 DIAGNOSIS — Z87891 Personal history of nicotine dependence: Secondary | ICD-10-CM | POA: Diagnosis not present

## 2021-08-16 DIAGNOSIS — I132 Hypertensive heart and chronic kidney disease with heart failure and with stage 5 chronic kidney disease, or end stage renal disease: Secondary | ICD-10-CM | POA: Insufficient documentation

## 2021-08-16 DIAGNOSIS — J449 Chronic obstructive pulmonary disease, unspecified: Secondary | ICD-10-CM | POA: Diagnosis not present

## 2021-08-16 DIAGNOSIS — D689 Coagulation defect, unspecified: Secondary | ICD-10-CM | POA: Diagnosis not present

## 2021-08-16 DIAGNOSIS — Z955 Presence of coronary angioplasty implant and graft: Secondary | ICD-10-CM | POA: Insufficient documentation

## 2021-08-16 DIAGNOSIS — T82511A Breakdown (mechanical) of surgically created arteriovenous shunt, initial encounter: Secondary | ICD-10-CM | POA: Insufficient documentation

## 2021-08-16 DIAGNOSIS — Z7901 Long term (current) use of anticoagulants: Secondary | ICD-10-CM | POA: Insufficient documentation

## 2021-08-16 DIAGNOSIS — E039 Hypothyroidism, unspecified: Secondary | ICD-10-CM | POA: Diagnosis not present

## 2021-08-16 DIAGNOSIS — T82590A Other mechanical complication of surgically created arteriovenous fistula, initial encounter: Secondary | ICD-10-CM

## 2021-08-16 DIAGNOSIS — Z992 Dependence on renal dialysis: Secondary | ICD-10-CM | POA: Diagnosis not present

## 2021-08-16 DIAGNOSIS — I2510 Atherosclerotic heart disease of native coronary artery without angina pectoris: Secondary | ICD-10-CM | POA: Insufficient documentation

## 2021-08-16 DIAGNOSIS — R52 Pain, unspecified: Secondary | ICD-10-CM | POA: Diagnosis not present

## 2021-08-16 DIAGNOSIS — N2581 Secondary hyperparathyroidism of renal origin: Secondary | ICD-10-CM | POA: Diagnosis not present

## 2021-08-16 DIAGNOSIS — N186 End stage renal disease: Secondary | ICD-10-CM | POA: Insufficient documentation

## 2021-08-16 DIAGNOSIS — I5021 Acute systolic (congestive) heart failure: Secondary | ICD-10-CM | POA: Diagnosis not present

## 2021-08-16 DIAGNOSIS — I120 Hypertensive chronic kidney disease with stage 5 chronic kidney disease or end stage renal disease: Secondary | ICD-10-CM | POA: Diagnosis not present

## 2021-08-16 DIAGNOSIS — D631 Anemia in chronic kidney disease: Secondary | ICD-10-CM | POA: Insufficient documentation

## 2021-08-16 DIAGNOSIS — Z79899 Other long term (current) drug therapy: Secondary | ICD-10-CM | POA: Insufficient documentation

## 2021-08-16 MED ORDER — LOSARTAN POTASSIUM 25 MG PO TABS: 37.5000 mg | ORAL_TABLET | Freq: Every day | ORAL | 3 refills | Status: DC

## 2021-08-16 NOTE — ED Provider Notes (Signed)
Emergency Medicine Provider Triage Evaluation Note  Jeremy Johnson , a 75 y.o. male  was evaluated in triage.  Patient reports he was in dialysis today when the dialysis nurse noticed a scab along his fistula.  Patient reports that access was performed at this spot around a week ago and has not caused him any trouble he did not notice until the nurse pointed out today.  Of note patient had a full dialysis session today without event. Review of Systems  Positive: Fistula issue Negative: Fever, chills, pain, numbness, tingling or any additional concerns.  Physical Exam  BP (!) 141/89 (BP Location: Right Arm)   Pulse 72   Temp 97.6 F (36.4 C) (Oral)   Resp 16   SpO2 98%  Gen:   Awake, no distress   Resp:  Normal effort  MSK:   Moves extremities without difficulty  Other:  Scab present along dialysis fistula left upper arm.  Radial pulses intact distally.  Capillary refill and sensation intact to fingers.  Strong and equal strength.     Medical Decision Making  Medically screening exam initiated at 11:44 AM.  Appropriate orders placed.  [name] was informed that the remainder of the evaluation will be completed by another provider, this initial triage assessment does not replace that evaluation, and the importance of remaining in the ED until their evaluation is complete. ------- 12:38 PM: Consult with vascular surgery Dr. Doren Custard, advises he will evaluate patient once he is in a room. ==== Charge nurse made aware of need for room for vascular surgery evaluation.  Note: Portions of this report may have been transcribed using voice recognition software. Every effort was made to ensure accuracy; however, inadvertent computerized transcription errors may still be present.    Deliah Boston, PA-C 08/16/21 1308    Valarie Merino, MD 08/17/21 [phone]

## 2021-08-16 NOTE — ED Triage Notes (Signed)
Patient here for evaluation of wound over AV graft in left upper arm that appeared on Thursday last week. Patient completed dialysis today and was told to go to vascular office for evaluation but states they referred him to ED because they would not be able to see him until tomorrow. Patient has no complaints, denies pain.

## 2021-08-16 NOTE — Consult Note (Signed)
Hospital Consult    Reason for Consult:  scab on dialysis access Requesting Physician:  Athena Masse [place] MRN #:  [id number]  History of Present Illness: This is a 75 y.o. male who presented to the hospital with a scab on his HD access.  Pt states that the scab came up on the fistula last Thursday.  He states that it has not bled.  He states his graft is working well.  He did have an intervention by [facility] about a year ago.    Dialysis access history: -left BC AVF 04/25/2019 Dr. Scot Dock -LUA AVG 05/19/2019 Dr. Scot Dock  He has hx of AAA open repair by Dr. Amedeo Plenty in 2006.  He has had lung cancer and treatment as well.    The pt is on a statin for cholesterol management.  The pt is not on a daily aspirin.   Other AC:  Plavix/Eliquis The pt is on BB, ARB for hypertension.   The pt is not diabetic.   Tobacco hx:  former  Past Medical History:  Diagnosis Date   Anemia    Blood transfusion without reported diagnosis    CAD (coronary artery disease)    STENT... MID CIRCUMFLEX...1997   Chronic kidney disease    STAGE 3   COPD (chronic obstructive pulmonary disease) (HCC)    Degenerative joint disease (DJD) of lumbar spine    GERD (gastroesophageal reflux disease)    Gout    Hyperlipidemia    Hypertension    Hypothyroidism    Incisional hernia    abdomen   Leukocytosis    CHRONIC MILD   Myocardial infarction ([place])    1997   SCL CA dx'd 01/2019   Lung cancer    Past Surgical History:  Procedure Laterality Date   ABDOMINAL AORTIC ANEURYSM REPAIR  2006   AV FISTULA PLACEMENT Left 04/25/2019   Procedure: ARTERIOVENOUS (AV) FISTULA CREATION LEFT ARM;  Surgeon: Angelia Mould, MD;  Location: [place];  Service: Vascular;  Laterality: Left;   AV FISTULA PLACEMENT Left 05/19/2019   Procedure: CONVERSION OF LEFT ARM ARTERIOVENOUS FISTULA TO GRAFT;  Surgeon: Angelia Mould, MD;  Location: [place];  Service: Vascular;  Laterality: Left;   BIOPSY  09/30/2018   Procedure: BIOPSY;   Surgeon: Danie Binder, MD;  [facility];  Service: Endoscopy;;  ascending colon   BIOPSY  07/27/2020   Procedure: BIOPSY;  Surgeon: Eloise Harman, DO;  [facility];  Service: Endoscopy;;  gastric   BUBBLE STUDY  09/23/2020   Procedure: BUBBLE STUDY;  Surgeon: Skeet Latch, MD;  Location: [place];  Service: Cardiovascular;;   CARDIOVERSION N/A 09/23/2020   Procedure: CARDIOVERSION;  Surgeon: Skeet Latch, MD;  Location: [place];  Service: Cardiovascular;  Laterality: N/A;   COLONOSCOPY  2008   COLONOSCOPY N/A 09/30/2018   External and internal hemorrhoids, six polyps removed, one ascending colon polypoid lesion biopsied. Six simple adenomas and one benign polypoid lesion. Colonoscopy Nov 2022.    COLONOSCOPY WITH PROPOFOL N/A 07/27/2020   non-bleeding internal hemorrhoids, sigmoid and descending colon diverticulosis, four 1-2 mm polyps in ascending colon, one 5 mm polyp in transverse colon. 3 year surveillance. Tubular adenomas.    CORONARY ANGIOPLASTY WITH STENT PLACEMENT  1997   MID CIRCUMFLEX   CORONARY STENT INTERVENTION N/A 09/20/2020   Procedure: CORONARY STENT INTERVENTION;  Surgeon: Martinique, Peter M, MD;  Location: [place] [facility];  Service: Cardiovascular;  Laterality: N/A;   CYSTOSCOPY W/ RETROGRADES Bilateral  03/28/2021   Procedure: CYSTOSCOPY WITH RETROGRADE PYELOGRAM;  Surgeon: Cleon Gustin, MD;  [facility]: [facility];  Service: Urology;  Laterality: Bilateral;   ESOPHAGOGASTRODUODENOSCOPY (EGD) WITH PROPOFOL N/A 07/27/2020   Food in middle third of esophagus, gastritis s/p biopsy, nodular mucosa in lesser curvature of stomach s/p biopsy. Negative H.pylori.    HIP ARTHROPLASTY Right 04/30/2020   Procedure: ARTHROPLASTY  HIP (HEMIARTHROPLASTY);  Surgeon: Altamese Salem, MD;  Location: [place];  Service: Orthopedics;  Laterality: Right;   INTRAVASCULAR ULTRASOUND/IVUS N/A 09/20/2020   Procedure: Intravascular Ultrasound/IVUS;  Surgeon:  Martinique, Peter M, MD;  Location: [place] [facility];  Service: Cardiovascular;  Laterality: N/A;   IR FLUORO GUIDE CV LINE RIGHT  04/22/2019   IR FLUORO GUIDE CV LINE RIGHT  05/01/2020   IR THORACENTESIS ASP PLEURAL SPACE W/IMG GUIDE  09/22/2020   IR THROMBECTOMY AV FISTULA W/THROMBOLYSIS/PTA INC/SHUNT/IMG LEFT Left 05/03/2020   IR US GUIDE VASC ACCESS LEFT  05/03/2020   IR US GUIDE VASC ACCESS RIGHT  04/22/2019   IR US GUIDE VASC ACCESS RIGHT  05/01/2020   POLYPECTOMY  09/30/2018   Procedure: POLYPECTOMY;  Surgeon: Danie Binder, MD;  [facility];  Service: Endoscopy;;  colon   POLYPECTOMY  07/27/2020   Procedure: POLYPECTOMY;  Surgeon: Eloise Harman, DO;  [facility];  Service: Endoscopy;;   RIGHT/LEFT HEART CATH AND CORONARY ANGIOGRAPHY N/A 09/20/2020   Procedure: RIGHT/LEFT HEART CATH AND CORONARY ANGIOGRAPHY;  Surgeon: Martinique, Peter M, MD;  Location: [place] [facility];  Service: Cardiovascular;  Laterality: N/A;   TEE WITHOUT CARDIOVERSION N/A 09/23/2020   Procedure: TRANSESOPHAGEAL ECHOCARDIOGRAM (TEE);  Surgeon: Skeet Latch, MD;  Location: [place];  Service: Cardiovascular;  Laterality: N/A;   TRANSURETHRAL RESECTION OF BLADDER TUMOR N/A 03/28/2021   Procedure: TRANSURETHRAL RESECTION OF BLADDER TUMOR (TURBT);  Surgeon: Cleon Gustin, MD;  [facility];  Service: Urology;  Laterality: N/A;   VIDEO BRONCHOSCOPY WITH ENDOBRONCHIAL NAVIGATION N/A 01/27/2019   Procedure: VIDEO BRONCHOSCOPY WITH ENDOBRONCHIAL NAVIGATION;  Surgeon: Grace Isaac, MD;  Location: [place];  Service: Thoracic;  Laterality: N/A;   VIDEO BRONCHOSCOPY WITH ENDOBRONCHIAL ULTRASOUND N/A 01/27/2019   Procedure: VIDEO BRONCHOSCOPY WITH ENDOBRONCHIAL ULTRASOUND;  Surgeon: Grace Isaac, MD;  Location: [place];  Service: Thoracic;  Laterality: N/A;    Allergies  Allergen Reactions   Advair Hfa [Fluticasone-Salmeterol] Other (See Comments)    Developed thrush, although the  mouth WAS being rinsed as directed   Penicillins Rash    Has patient had a PCN reaction causing immediate rash, facial/tongue/throat swelling, SOB or lightheadedness with hypotension: No Has patient had a PCN reaction causing severe rash involving mucus membranes or skin necrosis: No Has patient had a PCN reaction that required hospitalization: No Has patient had a PCN reaction occurring within the last 10 years: No If all of the above answers are "NO", then may proceed with Cephalosporin use.     Prior to Admission medications   Medication Sig Start Date End Date Taking? Authorizing Provider  acetaminophen (TYLENOL) 325 MG tablet Take 2 tablets (650 mg total) by mouth every 6 (six) hours as needed for mild pain (or Fever >/= 101). Patient taking differently: Take 1,300 mg by mouth every 6 (six) hours as needed for mild pain or headache (or Fever >/= 101). Arthritis strength 05/25/19   Swayze, Ava, DO  allopurinol (ZYLOPRIM) 100 MG tablet Take 1 tablet (100 mg total) by mouth daily. Patient taking differently: Take  100 mg by mouth in the morning. 04/26/19   Mercy Riding, MD  amiodarone (PACERONE) 200 MG tablet Take 1 tablet (200 mg total) by mouth daily. 04/26/21   Arnoldo Lenis, MD  atorvastatin (LIPITOR) 40 MG tablet TAKE 1 TABLET BY MOUTH  DAILY Patient taking differently: Take 40 mg by mouth daily. 12/02/20   Arnoldo Lenis, MD  AURYXIA 1 GM 210 MG(Fe) tablet Take by mouth. 03/17/21   [provider]  B Complex-C-Zn-Folic Acid (DIALYVITE 716-RCVE 15) 0.8 MG TABS Take 1 tablet by mouth daily. 04/22/20   [provider]  cephALEXin (KEFLEX) 500 MG capsule Take 1 capsule (500 mg total) by mouth 3 (three) times daily. 05/25/21   Horton, Barbette Hair, MD  clopidogrel (PLAVIX) 75 MG tablet TAKE 1 TABLET BY MOUTH  DAILY WITH BREAKFAST Patient taking differently: Take 75 mg by mouth daily with breakfast. 12/02/20   Branch, Alphonse Guild, MD  ELIQUIS 2.5 MG TABS tablet TAKE 1 TABLET  BY MOUTH  TWICE DAILY Patient taking differently: Take 2.5 mg by mouth 2 (two) times daily. 12/06/20   Arnoldo Lenis, MD  ipratropium (ATROVENT) 0.06 % nasal spray Place 2 sprays into both nostrils daily as needed for rhinitis.    [provider]  levocetirizine (XYZAL) 5 MG tablet Take 5 mg by mouth at bedtime. 11/22/20   [provider]  levothyroxine (SYNTHROID, LEVOTHROID) 175 MCG tablet Take 175 mcg by mouth daily before breakfast.     [provider]  lidocaine-prilocaine (EMLA) cream Apply 1 application topically Every Tuesday,Thursday,and Saturday with dialysis. 08/28/20   [provider]  losartan (COZAAR) 25 MG tablet Take 1 tablet (25 mg total) by mouth daily. 08/03/21 11/01/21  Arnoldo Lenis, MD  metoprolol succinate (TOPROL-XL) 25 MG 24 hr tablet Take 0.5 tablets (12.5 mg total) by mouth daily. 02/16/21   Arnoldo Lenis, MD  omeprazole (PRILOSEC) 40 MG capsule Take 1 capsule (40 mg total) by mouth in the morning and at bedtime. Patient taking differently: Take 40 mg by mouth daily. 07/27/20 07/08/21  Eloise Harman, DO  sevelamer carbonate (RENVELA) 800 MG tablet Take 800-1,600 mg by mouth 3 (three) times daily with meals.    [provider]    Social History   Socioeconomic History   Marital status: Married    Spouse name: Not on file   Number of children: 2   Years of education: Not on file   Highest education level: Not on file  Occupational History    Comment: retired  Tobacco Use   Smoking status: Former    Packs/day: 0.50    Years: 54.00    Pack years: 27.00    Types: Cigarettes    Quit date: 09/17/2017    Years since quitting: 3.9   Smokeless tobacco: Never  Vaping Use   Vaping Use: Never used  Substance and Sexual Activity   Alcohol use: Not Currently    Comment: occasional   Drug use: No   Sexual activity: Not Currently  Other Topics Concern   Not on file  Social History Narrative   Not on file    Social Determinants of Health   Financial Resource Strain: Not on file  Food Insecurity: Not on file  Transportation Needs: Not on file  Physical Activity: Not on file  Stress: Not on file  Social Connections: Not on file  Intimate Partner Violence: Not on file     Family History  Problem Relation Age  of Onset   Stroke Brother    Lung cancer Sister 88       lung cancer/former   Colon cancer Neg Hx    Colon polyps Neg Hx     ROS: [x]  Positive   [ ]  Negative   [ ]  All sytems reviewed and are negative  Cardiac: []  chest pain/pressure []  palpitations []  SOB lying flat []  DOE  Vascular: []  pain in legs while walking []  pain in legs at rest []  pain in legs at night []  non-healing ulcers []  hx of DVT []  swelling in legs  Pulmonary: []  asthma/wheezing []  home O2  Neurologic: []  hx of CVA []  mini stroke   Hematologic: []  hx of cancer  Endocrine:   []  diabetes []  thyroid disease  GI []  GERD  GU: [x]  CKD/renal failure [x]  HD--[]  M/W/F or [x]  T/T/S  Psychiatric: []  anxiety []  depression  Musculoskeletal: []  arthritis []  joint pain  Integumentary: []  rashes [x]  ulcers  Constitutional: []  fever  []  chills  Physical Examination  Vitals:   08/16/21 1138  BP: (!) 141/89  Pulse: 72  Resp: 16  Temp: 97.6 F (36.4 C)  SpO2: 98%   There is no height or weight on file to calculate BMI.  General:  WDWN in NAD Gait: Not observed HENT: WNL, normocephalic Pulmonary: normal non-labored breathing Cardiac: regular Skin: without rashes Vascular Exam/Pulses:  Right Left  Radial 2+ (normal) 2+ (normal)   Extremities: LUA AVG has a thrill.  It is somewhat pulsatile distally.   Musculoskeletal: no muscle wasting or atrophy  Neurologic: A&O X 3; speech is fluent/normal Psychiatric:  The pt has Normal affect.  CBC    Component Value Date/Time   WBC 8.8 07/20/2021 0911   WBC 16.0 (H) 05/24/2021 2201   RBC 4.22 07/20/2021 0911   HGB 13.6  07/20/2021 0911   HCT 40.9 07/20/2021 0911   HCT 17.5 (LL) 05/23/2019 0746   PLT 202 07/20/2021 0911   MCV 96.9 07/20/2021 0911   MCH 32.2 07/20/2021 0911   MCHC 33.3 07/20/2021 0911   RDW 16.6 (H) 07/20/2021 0911   LYMPHSABS 1.7 07/20/2021 0911   MONOABS 1.2 (H) 07/20/2021 0911   EOSABS 0.2 07/20/2021 0911   BASOSABS 0.1 07/20/2021 0911    BMET    Component Value Date/Time   NA 137 07/20/2021 0911   K 4.5 07/20/2021 0911   CL 95 (L) 07/20/2021 0911   CO2 29 07/20/2021 0911   GLUCOSE 126 (H) 07/20/2021 0911   BUN 22 07/20/2021 0911   CREATININE 7.18 (HH) 07/20/2021 0911   CALCIUM 9.1 07/20/2021 0911   GFRNONAA 7 (L) 07/20/2021 0911   GFRAA 18 (L) 07/20/2020 1058    COAGS: Lab Results  Component Value Date   INR 2.0 (H) 09/22/2020   INR 1.4 (H) 09/20/2020   INR 1.3 (H) 09/17/2020        ASSESSMENT/PLAN: This is a 75 y.o. male with ESRD with LUA AVG that was placed in July 2020 by Dr. Scot Dock now with ulcer  -this ulcer has not bled but is at risk.  Discussed pt with Dr. Scot Dock and given pt is on Eliquis and the ulcer has not bled, will plan for OR on Thursday.  Discussed with pt and his wife how to hold pressure and call 911 should this bleed.  They expressed understanding.  -our office will arrange surgery for Thursday. -Dr.  Scot Dock to see pt this afternoon prior to discharge.    Leontine Locket, PA-C  [facility] [phone]

## 2021-08-16 NOTE — Discharge Instructions (Signed)
The plan is for revision of the AV graft on Thursday, 10/6.  The vascular surgeons want you to hold Eliquis or Plavix until surgery.  They will call you and let you know what time to be at the hospital.  They will rearrange dialysis if necessary.

## 2021-08-16 NOTE — Telephone Encounter (Signed)
Pt informed of dose increase, e-scribed to [facility]

## 2021-08-16 NOTE — H&P (View-Only) (Signed)
Hospital Consult    Reason for Consult:  scab on dialysis access Requesting Physician:  Athena Masse [place] MRN #:  [id number]  History of Present Illness: This is a 75 y.o. male who presented to the hospital with a scab on his HD access.  Pt states that the scab came up on the fistula last Thursday.  He states that it has not bled.  He states his graft is working well.  He did have an intervention by [facility] about a year ago.    Dialysis access history: -left BC AVF 04/25/2019 Dr. Scot Dock -LUA AVG 05/19/2019 Dr. Scot Dock  He has hx of AAA open repair by Dr. Amedeo Plenty in 2006.  He has had lung cancer and treatment as well.    The pt is on a statin for cholesterol management.  The pt is not on a daily aspirin.   Other AC:  Plavix/Eliquis The pt is on BB, ARB for hypertension.   The pt is not diabetic.   Tobacco hx:  former  Past Medical History:  Diagnosis Date   Anemia    Blood transfusion without reported diagnosis    CAD (coronary artery disease)    STENT... MID CIRCUMFLEX...1997   Chronic kidney disease    STAGE 3   COPD (chronic obstructive pulmonary disease) (HCC)    Degenerative joint disease (DJD) of lumbar spine    GERD (gastroesophageal reflux disease)    Gout    Hyperlipidemia    Hypertension    Hypothyroidism    Incisional hernia    abdomen   Leukocytosis    CHRONIC MILD   Myocardial infarction ([place])    1997   SCL CA dx'd 01/2019   Lung cancer    Past Surgical History:  Procedure Laterality Date   ABDOMINAL AORTIC ANEURYSM REPAIR  2006   AV FISTULA PLACEMENT Left 04/25/2019   Procedure: ARTERIOVENOUS (AV) FISTULA CREATION LEFT ARM;  Surgeon: Angelia Mould, MD;  Location: [place];  Service: Vascular;  Laterality: Left;   AV FISTULA PLACEMENT Left 05/19/2019   Procedure: CONVERSION OF LEFT ARM ARTERIOVENOUS FISTULA TO GRAFT;  Surgeon: Angelia Mould, MD;  Location: [place];  Service: Vascular;  Laterality: Left;   BIOPSY  09/30/2018   Procedure: BIOPSY;   Surgeon: Danie Binder, MD;  [facility];  Service: Endoscopy;;  ascending colon   BIOPSY  07/27/2020   Procedure: BIOPSY;  Surgeon: Eloise Harman, DO;  [facility];  Service: Endoscopy;;  gastric   BUBBLE STUDY  09/23/2020   Procedure: BUBBLE STUDY;  Surgeon: Skeet Latch, MD;  Location: [facility];  Service: Cardiovascular;;   CARDIOVERSION N/A 09/23/2020   Procedure: CARDIOVERSION;  Surgeon: Skeet Latch, MD;  Location: [place];  Service: Cardiovascular;  Laterality: N/A;   COLONOSCOPY  2008   COLONOSCOPY N/A 09/30/2018   External and internal hemorrhoids, six polyps removed, one ascending colon polypoid lesion biopsied. Six simple adenomas and one benign polypoid lesion. Colonoscopy Nov 2022.    COLONOSCOPY WITH PROPOFOL N/A 07/27/2020   non-bleeding internal hemorrhoids, sigmoid and descending colon diverticulosis, four 1-2 mm polyps in ascending colon, one 5 mm polyp in transverse colon. 3 year surveillance. Tubular adenomas.    CORONARY ANGIOPLASTY WITH STENT PLACEMENT  1997   MID CIRCUMFLEX   CORONARY STENT INTERVENTION N/A 09/20/2020   Procedure: CORONARY STENT INTERVENTION;  Surgeon: Martinique, Peter M, MD;  Location: [facility] [facility];  Service: Cardiovascular;  Laterality: N/A;   CYSTOSCOPY W/ RETROGRADES Bilateral  03/28/2021   Procedure: CYSTOSCOPY WITH RETROGRADE PYELOGRAM;  Surgeon: Cleon Gustin, MD;  [facility]: [facility];  Service: Urology;  Laterality: Bilateral;   ESOPHAGOGASTRODUODENOSCOPY (EGD) WITH PROPOFOL N/A 07/27/2020   Food in middle third of esophagus, gastritis s/p biopsy, nodular mucosa in lesser curvature of stomach s/p biopsy. Negative H.pylori.    HIP ARTHROPLASTY Right 04/30/2020   Procedure: ARTHROPLASTY  HIP (HEMIARTHROPLASTY);  Surgeon: Altamese Bridgehampton, MD;  Location: [place];  Service: Orthopedics;  Laterality: Right;   INTRAVASCULAR ULTRASOUND/IVUS N/A 09/20/2020   Procedure: Intravascular Ultrasound/IVUS;  Surgeon:  Martinique, Peter M, MD;  Location: [facility];  Service: Cardiovascular;  Laterality: N/A;   IR FLUORO GUIDE CV LINE RIGHT  04/22/2019   IR FLUORO GUIDE CV LINE RIGHT  05/01/2020   IR THORACENTESIS ASP PLEURAL SPACE W/IMG GUIDE  09/22/2020   IR THROMBECTOMY AV FISTULA W/THROMBOLYSIS/PTA INC/SHUNT/IMG LEFT Left 05/03/2020   IR US GUIDE VASC ACCESS LEFT  05/03/2020   IR US GUIDE VASC ACCESS RIGHT  04/22/2019   IR US GUIDE VASC ACCESS RIGHT  05/01/2020   POLYPECTOMY  09/30/2018   Procedure: POLYPECTOMY;  Surgeon: Danie Binder, MD;  [facility];  Service: Endoscopy;;  colon   POLYPECTOMY  07/27/2020   Procedure: POLYPECTOMY;  Surgeon: Eloise Harman, DO;  [facility];  Service: Endoscopy;;   RIGHT/LEFT HEART CATH AND CORONARY ANGIOGRAPHY N/A 09/20/2020   Procedure: RIGHT/LEFT HEART CATH AND CORONARY ANGIOGRAPHY;  Surgeon: Martinique, Peter M, MD;  Location: [facility];  Service: Cardiovascular;  Laterality: N/A;   TEE WITHOUT CARDIOVERSION N/A 09/23/2020   Procedure: TRANSESOPHAGEAL ECHOCARDIOGRAM (TEE);  Surgeon: Skeet Latch, MD;  Location: [facility] [place];  Service: Cardiovascular;  Laterality: N/A;   TRANSURETHRAL RESECTION OF BLADDER TUMOR N/A 03/28/2021   Procedure: TRANSURETHRAL RESECTION OF BLADDER TUMOR (TURBT);  Surgeon: Cleon Gustin, MD;  [facility];  Service: Urology;  Laterality: N/A;   VIDEO BRONCHOSCOPY WITH ENDOBRONCHIAL NAVIGATION N/A 01/27/2019   Procedure: VIDEO BRONCHOSCOPY WITH ENDOBRONCHIAL NAVIGATION;  Surgeon: Grace Isaac, MD;  Location: [place];  Service: Thoracic;  Laterality: N/A;   VIDEO BRONCHOSCOPY WITH ENDOBRONCHIAL ULTRASOUND N/A 01/27/2019   Procedure: VIDEO BRONCHOSCOPY WITH ENDOBRONCHIAL ULTRASOUND;  Surgeon: Grace Isaac, MD;  Location: [place];  Service: Thoracic;  Laterality: N/A;    Allergies  Allergen Reactions   Advair Hfa [Fluticasone-Salmeterol] Other (See Comments)    Developed thrush, although the  mouth WAS being rinsed as directed   Penicillins Rash    Has patient had a PCN reaction causing immediate rash, facial/tongue/throat swelling, SOB or lightheadedness with hypotension: No Has patient had a PCN reaction causing severe rash involving mucus membranes or skin necrosis: No Has patient had a PCN reaction that required hospitalization: No Has patient had a PCN reaction occurring within the last 10 years: No If all of the above answers are "NO", then may proceed with Cephalosporin use.     Prior to Admission medications   Medication Sig Start Date End Date Taking? Authorizing Provider  acetaminophen (TYLENOL) 325 MG tablet Take 2 tablets (650 mg total) by mouth every 6 (six) hours as needed for mild pain (or Fever >/= 101). Patient taking differently: Take 1,300 mg by mouth every 6 (six) hours as needed for mild pain or headache (or Fever >/= 101). Arthritis strength 05/25/19   Swayze, Ava, DO  allopurinol (ZYLOPRIM) 100 MG tablet Take 1 tablet (100 mg total) by mouth daily. Patient taking differently: Take  100 mg by mouth in the morning. 04/26/19   Mercy Riding, MD  amiodarone (PACERONE) 200 MG tablet Take 1 tablet (200 mg total) by mouth daily. 04/26/21   Arnoldo Lenis, MD  atorvastatin (LIPITOR) 40 MG tablet TAKE 1 TABLET BY MOUTH  DAILY Patient taking differently: Take 40 mg by mouth daily. 12/02/20   Arnoldo Lenis, MD  AURYXIA 1 GM 210 MG(Fe) tablet Take by mouth. 03/17/21   [provider]  B Complex-C-Zn-Folic Acid (DIALYVITE 767-MCNO 15) 0.8 MG TABS Take 1 tablet by mouth daily. 04/22/20   [provider]  cephALEXin (KEFLEX) 500 MG capsule Take 1 capsule (500 mg total) by mouth 3 (three) times daily. 05/25/21   Horton, Barbette Hair, MD  clopidogrel (PLAVIX) 75 MG tablet TAKE 1 TABLET BY MOUTH  DAILY WITH BREAKFAST Patient taking differently: Take 75 mg by mouth daily with breakfast. 12/02/20   Branch, Alphonse Guild, MD  ELIQUIS 2.5 MG TABS tablet TAKE 1 TABLET  BY MOUTH  TWICE DAILY Patient taking differently: Take 2.5 mg by mouth 2 (two) times daily. 12/06/20   Arnoldo Lenis, MD  ipratropium (ATROVENT) 0.06 % nasal spray Place 2 sprays into both nostrils daily as needed for rhinitis.    [provider]  levocetirizine (XYZAL) 5 MG tablet Take 5 mg by mouth at bedtime. 11/22/20   [provider]  levothyroxine (SYNTHROID, LEVOTHROID) 175 MCG tablet Take 175 mcg by mouth daily before breakfast.     [provider]  lidocaine-prilocaine (EMLA) cream Apply 1 application topically Every Tuesday,Thursday,and Saturday with dialysis. 08/28/20   [provider]  losartan (COZAAR) 25 MG tablet Take 1 tablet (25 mg total) by mouth daily. 08/03/21 11/01/21  Arnoldo Lenis, MD  metoprolol succinate (TOPROL-XL) 25 MG 24 hr tablet Take 0.5 tablets (12.5 mg total) by mouth daily. 02/16/21   Arnoldo Lenis, MD  omeprazole (PRILOSEC) 40 MG capsule Take 1 capsule (40 mg total) by mouth in the morning and at bedtime. Patient taking differently: Take 40 mg by mouth daily. 07/27/20 07/08/21  Eloise Harman, DO  sevelamer carbonate (RENVELA) 800 MG tablet Take 800-1,600 mg by mouth 3 (three) times daily with meals.    [provider]    Social History   Socioeconomic History   Marital status: Married    Spouse name: Not on file   Number of children: 2   Years of education: Not on file   Highest education level: Not on file  Occupational History    Comment: retired  Tobacco Use   Smoking status: Former    Packs/day: 0.50    Years: 54.00    Pack years: 27.00    Types: Cigarettes    Quit date: 09/17/2017    Years since quitting: 3.9   Smokeless tobacco: Never  Vaping Use   Vaping Use: Never used  Substance and Sexual Activity   Alcohol use: Not Currently    Comment: occasional   Drug use: No   Sexual activity: Not Currently  Other Topics Concern   Not on file  Social History Narrative   Not on file    Social Determinants of Health   Financial Resource Strain: Not on file  Food Insecurity: Not on file  Transportation Needs: Not on file  Physical Activity: Not on file  Stress: Not on file  Social Connections: Not on file  Intimate Partner Violence: Not on file     Family History  Problem Relation Age  of Onset   Stroke Brother    Lung cancer Sister 73       lung cancer/former   Colon cancer Neg Hx    Colon polyps Neg Hx     ROS: [x]  Positive   [ ]  Negative   [ ]  All sytems reviewed and are negative  Cardiac: []  chest pain/pressure []  palpitations []  SOB lying flat []  DOE  Vascular: []  pain in legs while walking []  pain in legs at rest []  pain in legs at night []  non-healing ulcers []  hx of DVT []  swelling in legs  Pulmonary: []  asthma/wheezing []  home O2  Neurologic: []  hx of CVA []  mini stroke   Hematologic: []  hx of cancer  Endocrine:   []  diabetes []  thyroid disease  GI []  GERD  GU: [x]  CKD/renal failure [x]  HD--[]  M/W/F or [x]  T/T/S  Psychiatric: []  anxiety []  depression  Musculoskeletal: []  arthritis []  joint pain  Integumentary: []  rashes [x]  ulcers  Constitutional: []  fever  []  chills  Physical Examination  Vitals:   08/16/21 1138  BP: (!) 141/89  Pulse: 72  Resp: 16  Temp: 97.6 F (36.4 C)  SpO2: 98%   There is no height or weight on file to calculate BMI.  General:  WDWN in NAD Gait: Not observed HENT: WNL, normocephalic Pulmonary: normal non-labored breathing Cardiac: regular Skin: without rashes Vascular Exam/Pulses:  Right Left  Radial 2+ (normal) 2+ (normal)   Extremities: LUA AVG has a thrill.  It is somewhat pulsatile distally.   Musculoskeletal: no muscle wasting or atrophy  Neurologic: A&O X 3; speech is fluent/normal Psychiatric:  The pt has Normal affect.  CBC    Component Value Date/Time   WBC 8.8 07/20/2021 0911   WBC 16.0 (H) 05/24/2021 2201   RBC 4.22 07/20/2021 0911   HGB 13.6  07/20/2021 0911   HCT 40.9 07/20/2021 0911   HCT 17.5 (LL) 05/23/2019 0746   PLT 202 07/20/2021 0911   MCV 96.9 07/20/2021 0911   MCH 32.2 07/20/2021 0911   MCHC 33.3 07/20/2021 0911   RDW 16.6 (H) 07/20/2021 0911   LYMPHSABS 1.7 07/20/2021 0911   MONOABS 1.2 (H) 07/20/2021 0911   EOSABS 0.2 07/20/2021 0911   BASOSABS 0.1 07/20/2021 0911    BMET    Component Value Date/Time   NA 137 07/20/2021 0911   K 4.5 07/20/2021 0911   CL 95 (L) 07/20/2021 0911   CO2 29 07/20/2021 0911   GLUCOSE 126 (H) 07/20/2021 0911   BUN 22 07/20/2021 0911   CREATININE 7.18 (HH) 07/20/2021 0911   CALCIUM 9.1 07/20/2021 0911   GFRNONAA 7 (L) 07/20/2021 0911   GFRAA 18 (L) 07/20/2020 1058    COAGS: Lab Results  Component Value Date   INR 2.0 (H) 09/22/2020   INR 1.4 (H) 09/20/2020   INR 1.3 (H) 09/17/2020        ASSESSMENT/PLAN: This is a 75 y.o. male with ESRD with LUA AVG that was placed in July 2020 by Dr. Scot Dock now with ulcer  -this ulcer has not bled but is at risk.  Discussed pt with Dr. Scot Dock and given pt is on Eliquis and the ulcer has not bled, will plan for OR on Thursday.  Discussed with pt and his wife how to hold pressure and call 911 should this bleed.  They expressed understanding.  -our office will arrange surgery for Thursday. -Dr.  Scot Dock to see pt this afternoon prior to discharge.    Leontine Locket, PA-C  [facility] [phone]

## 2021-08-16 NOTE — ED Provider Notes (Signed)
[facility] EMERGENCY DEPARTMENT Provider Note   CSN: [id number] Arrival date & time: 08/16/21  1128     History Chief Complaint  Patient presents with   Vascular Access Problem    Jeremy Johnson is a 75 y.o. male.  Pt presents to the ED today with a scab to his AV fistula.  Pt was at dialysis when it was noted.  He did get a full dialysis treatment and was sent over here for vascular to see.  He feels fine otherwise.      Past Medical History:  Diagnosis Date   Anemia    Blood transfusion without reported diagnosis    CAD (coronary artery disease)    STENT... MID CIRCUMFLEX...1997   Chronic kidney disease    STAGE 3   COPD (chronic obstructive pulmonary disease) (HCC)    Degenerative joint disease (DJD) of lumbar spine    GERD (gastroesophageal reflux disease)    Gout    Hyperlipidemia    Hypertension    Hypothyroidism    Incisional hernia    abdomen   Leukocytosis    CHRONIC MILD   Myocardial infarction ([place])    1997   SCL CA dx'd 01/2019   Lung cancer    Patient Active Problem List   Diagnosis Date Noted   Malignant neoplasm of overlapping sites of bladder ([place]) 04/12/2021   Bladder tumor 12/07/2020   Gross hematuria 12/07/2020   Elevated LFTs 09/29/2020   Dysphagia 09/29/2020   A-fib ([place]) 09/22/2020   Palliative care by specialist    DNR (do not resuscitate) discussion    Acute systolic CHF (congestive heart failure) ([place])    Restless leg syndrome 09/17/2020   Elevated troponin I level 09/17/2020   Loss of weight 06/09/2020   Early satiety 06/09/2020   Closed displaced fracture of right femoral neck with delayed healing 04/29/2020   Closed displaced fracture of right femoral neck ([place]) 04/29/2020   Acute respiratory failure with hypoxia ([place]) 04/06/2020   Acute and chronic respiratory failure with hypoxia ([place]) 04/05/2020   Chronic obstructive pulmonary disease/emphysema    Anaphylactic reaction due to adverse effect of correct  drug or medicament properly administered, initial encounter 07/30/2019   Hypocalcemia 05/31/2019   Other disorders of phosphorus metabolism 05/31/2019   Pancytopenia ([place]) 05/22/2019   ESRD (end stage renal disease) on dialysis ([place]) 05/22/2019   Unspecified protein-calorie malnutrition ([place]) 05/02/2019   Coagulation defect, unspecified ([place]) 04/26/2019   Diarrhea, unspecified 04/26/2019   Hypokalemia 04/26/2019   Pain, unspecified 04/26/2019   Pruritus, unspecified 04/26/2019   Secondary hyperparathyroidism of renal origin ([place]) 04/26/2019   Encounter for immunization 04/26/2019   Incisional hernia without obstruction or gangrene 04/25/2019   Malignant neoplasm of unspecified part of unspecified bronchus or lung ([place]) 04/25/2019   Other specified degenerative diseases of nervous system ([place]) 04/25/2019   Anemia in chronic kidney disease 04/25/2019   Atherosclerotic heart disease of native coronary artery without angina pectoris 04/25/2019   Gastro-esophageal reflux disease without esophagitis 04/25/2019   Hypothyroidism, unspecified 04/25/2019   Acute renal failure superimposed on stage 3 chronic kidney disease ([place]) 04/16/2019   Anemia in chronic kidney disease (CKD) 04/14/2019   Small cell lung cancer ([place]) 02/06/2019   Encounter for antineoplastic chemotherapy 02/06/2019   Goals of care, counseling/discussion 02/06/2019   Special screening for malignant neoplasms, colon    GERD (gastroesophageal reflux disease)    Hyperlipidemia    Gout    Hypothyroidism    Degenerative joint  disease (DJD) of lumbar spine    Acquired hypothyroidism 02/02/2010   HLD (hyperlipidemia) 02/02/2010   Essential hypertension 02/02/2010   GERD 02/02/2010   ABDOMINAL AORTIC ANEURYSM REPAIR, HX OF 02/02/2010    Past Surgical History:  Procedure Laterality Date   ABDOMINAL AORTIC ANEURYSM REPAIR  2006   AV FISTULA PLACEMENT Left 04/25/2019   Procedure: ARTERIOVENOUS (AV) FISTULA CREATION LEFT ARM;   Surgeon: Angelia Mould, MD;  Location: [place];  Service: Vascular;  Laterality: Left;   AV FISTULA PLACEMENT Left 05/19/2019   Procedure: CONVERSION OF LEFT ARM ARTERIOVENOUS FISTULA TO GRAFT;  Surgeon: Angelia Mould, MD;  Location: [place];  Service: Vascular;  Laterality: Left;   BIOPSY  09/30/2018   Procedure: BIOPSY;  Surgeon: Danie Binder, MD;  [facility];  Service: Endoscopy;;  ascending colon   BIOPSY  07/27/2020   Procedure: BIOPSY;  Surgeon: Eloise Harman, DO;  [facility];  Service: Endoscopy;;  gastric   BUBBLE STUDY  09/23/2020   Procedure: BUBBLE STUDY;  Surgeon: Skeet Latch, MD;  Location: [place];  Service: Cardiovascular;;   CARDIOVERSION N/A 09/23/2020   Procedure: CARDIOVERSION;  Surgeon: Skeet Latch, MD;  Location: [place];  Service: Cardiovascular;  Laterality: N/A;   COLONOSCOPY  2008   COLONOSCOPY N/A 09/30/2018   External and internal hemorrhoids, six polyps removed, one ascending colon polypoid lesion biopsied. Six simple adenomas and one benign polypoid lesion. Colonoscopy Nov 2022.    COLONOSCOPY WITH PROPOFOL N/A 07/27/2020   non-bleeding internal hemorrhoids, sigmoid and descending colon diverticulosis, four 1-2 mm polyps in ascending colon, one 5 mm polyp in transverse colon. 3 year surveillance. Tubular adenomas.    CORONARY ANGIOPLASTY WITH STENT PLACEMENT  1997   MID CIRCUMFLEX   CORONARY STENT INTERVENTION N/A 09/20/2020   Procedure: CORONARY STENT INTERVENTION;  Surgeon: Martinique, Peter M, MD;  Location: [place] [facility];  Service: Cardiovascular;  Laterality: N/A;   CYSTOSCOPY W/ RETROGRADES Bilateral 03/28/2021   Procedure: CYSTOSCOPY WITH RETROGRADE PYELOGRAM;  Surgeon: Cleon Gustin, MD;  Location: [facility];  Service: Urology;  Laterality: Bilateral;   ESOPHAGOGASTRODUODENOSCOPY (EGD) WITH PROPOFOL N/A 07/27/2020   Food in middle third of esophagus, gastritis s/p biopsy, nodular mucosa in  lesser curvature of stomach s/p biopsy. Negative H.pylori.    HIP ARTHROPLASTY Right 04/30/2020   Procedure: ARTHROPLASTY  HIP (HEMIARTHROPLASTY);  Surgeon: [place], MD;  Location: [place];  Service: Orthopedics;  Laterality: Right;   INTRAVASCULAR ULTRASOUND/IVUS N/A 09/20/2020   Procedure: Intravascular Ultrasound/IVUS;  Surgeon: Martinique, Peter M, MD;  Location: [place] [facility];  Service: Cardiovascular;  Laterality: N/A;   IR FLUORO GUIDE CV LINE RIGHT  04/22/2019   IR FLUORO GUIDE CV LINE RIGHT  05/01/2020   IR THORACENTESIS ASP PLEURAL SPACE W/IMG GUIDE  09/22/2020   IR THROMBECTOMY AV FISTULA W/THROMBOLYSIS/PTA INC/SHUNT/IMG LEFT Left 05/03/2020   IR US GUIDE VASC ACCESS LEFT  05/03/2020   IR US GUIDE VASC ACCESS RIGHT  04/22/2019   IR US GUIDE VASC ACCESS RIGHT  05/01/2020   POLYPECTOMY  09/30/2018   Procedure: POLYPECTOMY;  Surgeon: Danie Binder, MD;  [facility];  Service: Endoscopy;;  colon   POLYPECTOMY  07/27/2020   Procedure: POLYPECTOMY;  Surgeon: Eloise Harman, DO;  [facility];  Service: Endoscopy;;   RIGHT/LEFT HEART CATH AND CORONARY ANGIOGRAPHY N/A 09/20/2020   Procedure: RIGHT/LEFT HEART CATH AND CORONARY ANGIOGRAPHY;  Surgeon: Martinique, Peter M, MD;  Location: [place] [facility];  Service: Cardiovascular;  Laterality: N/A;   TEE WITHOUT CARDIOVERSION N/A 09/23/2020   Procedure: TRANSESOPHAGEAL ECHOCARDIOGRAM (TEE);  Surgeon: Skeet Latch, MD;  Location: [place];  Service: Cardiovascular;  Laterality: N/A;   TRANSURETHRAL RESECTION OF BLADDER TUMOR N/A 03/28/2021   Procedure: TRANSURETHRAL RESECTION OF BLADDER TUMOR (TURBT);  Surgeon: Cleon Gustin, MD;  Location[facility];  Service: Urology;  Laterality: N/A;   VIDEO BRONCHOSCOPY WITH ENDOBRONCHIAL NAVIGATION N/A 01/27/2019   Procedure: VIDEO BRONCHOSCOPY WITH ENDOBRONCHIAL NAVIGATION;  Surgeon: Grace Isaac, MD;  Location: [place];  Service: Thoracic;  Laterality: N/A;   VIDEO  BRONCHOSCOPY WITH ENDOBRONCHIAL ULTRASOUND N/A 01/27/2019   Procedure: VIDEO BRONCHOSCOPY WITH ENDOBRONCHIAL ULTRASOUND;  Surgeon: Grace Isaac, MD;  [facility];  Service: Thoracic;  Laterality: N/A;       Family History  Problem Relation Age of Onset   Stroke Brother    Lung cancer Sister 60       lung cancer/former   Colon cancer Neg Hx    Colon polyps Neg Hx     Social History   Tobacco Use   Smoking status: Former    Packs/day: 0.50    Years: 54.00    Pack years: 27.00    Types: Cigarettes    Quit date: 09/17/2017    Years since quitting: 3.9   Smokeless tobacco: Never  Vaping Use   Vaping Use: Never used  Substance Use Topics   Alcohol use: Not Currently    Comment: occasional   Drug use: No    Home Medications Prior to Admission medications   Medication Sig Start Date End Date Taking? Authorizing Provider  acetaminophen (TYLENOL) 325 MG tablet Take 2 tablets (650 mg total) by mouth every 6 (six) hours as needed for mild pain (or Fever >/= 101). Patient taking differently: Take 1,300 mg by mouth every 6 (six) hours as needed for mild pain or headache (or Fever >/= 101). Arthritis strength 05/25/19   Swayze, Ava, DO  allopurinol (ZYLOPRIM) 100 MG tablet Take 1 tablet (100 mg total) by mouth daily. Patient taking differently: Take 100 mg by mouth in the morning. 04/26/19   Mercy Riding, MD  amiodarone (PACERONE) 200 MG tablet Take 1 tablet (200 mg total) by mouth daily. 04/26/21   Arnoldo Lenis, MD  atorvastatin (LIPITOR) 40 MG tablet TAKE 1 TABLET BY MOUTH  DAILY Patient taking differently: Take 40 mg by mouth daily. 12/02/20   Arnoldo Lenis, MD  AURYXIA 1 GM 210 MG(Fe) tablet Take by mouth. 03/17/21   [provider]  B Complex-C-Zn-Folic Acid (DIALYVITE 025-ENID 15) 0.8 MG TABS Take 1 tablet by mouth daily. 04/22/20   [provider]  cephALEXin (KEFLEX) 500 MG capsule Take 1 capsule (500 mg total) by mouth 3 (three) times daily.  05/25/21   Horton, Barbette Hair, MD  clopidogrel (PLAVIX) 75 MG tablet TAKE 1 TABLET BY MOUTH  DAILY WITH BREAKFAST Patient taking differently: Take 75 mg by mouth daily with breakfast. 12/02/20   Branch, Alphonse Guild, MD  ELIQUIS 2.5 MG TABS tablet TAKE 1 TABLET BY MOUTH  TWICE DAILY Patient taking differently: Take 2.5 mg by mouth 2 (two) times daily. 12/06/20   Arnoldo Lenis, MD  ipratropium (ATROVENT) 0.06 % nasal spray Place 2 sprays into both nostrils daily as needed for rhinitis.    [provider]  levocetirizine (XYZAL) 5 MG tablet Take 5 mg by mouth at bedtime. 11/22/20  [provider]  levothyroxine (SYNTHROID, LEVOTHROID) 175 MCG tablet Take 175 mcg by mouth daily before breakfast.     [provider]  lidocaine-prilocaine (EMLA) cream Apply 1 application topically Every Tuesday,Thursday,and Saturday with dialysis. 08/28/20   [provider]  losartan (COZAAR) 25 MG tablet Take 1 tablet (25 mg total) by mouth daily. 08/03/21 11/01/21  Arnoldo Lenis, MD  metoprolol succinate (TOPROL-XL) 25 MG 24 hr tablet Take 0.5 tablets (12.5 mg total) by mouth daily. 02/16/21   Arnoldo Lenis, MD  omeprazole (PRILOSEC) 40 MG capsule Take 1 capsule (40 mg total) by mouth in the morning and at bedtime. Patient taking differently: Take 40 mg by mouth daily. 07/27/20 07/08/21  Eloise Harman, DO  sevelamer carbonate (RENVELA) 800 MG tablet Take 800-1,600 mg by mouth 3 (three) times daily with meals.    [provider]    Allergies    Advair hfa [fluticasone-salmeterol] and Penicillins  Review of Systems   Review of Systems  Skin:  Positive for wound.  All other systems reviewed and are negative.  Physical Exam Updated Vital Signs BP (!) 141/89 (BP Location: Right Arm)   Pulse 72   Temp 97.6 F (36.4 C) (Oral)   Resp 16   SpO2 98%   Physical Exam Vitals and nursing note reviewed.  Constitutional:      Appearance: Normal appearance.   HENT:     Head: Normocephalic and atraumatic.     Right Ear: External ear normal.     Left Ear: External ear normal.     Nose: Nose normal.     Mouth/Throat:     Mouth: Mucous membranes are moist.     Pharynx: Oropharynx is clear.  Eyes:     Extraocular Movements: Extraocular movements intact.     Conjunctiva/sclera: Conjunctivae normal.     Pupils: Pupils are equal, round, and reactive to light.  Cardiovascular:     Rate and Rhythm: Normal rate and regular rhythm.     Pulses: Normal pulses.     Heart sounds: Normal heart sounds.  Pulmonary:     Effort: Pulmonary effort is normal.     Breath sounds: Normal breath sounds.  Abdominal:     General: Abdomen is flat. Bowel sounds are normal.     Palpations: Abdomen is soft.  Musculoskeletal:        General: Normal range of motion.     Cervical back: Normal range of motion and neck supple.  Skin:    Capillary Refill: Capillary refill takes less than 2 seconds.     Comments: Scab to left arm AVF.  See picture.  Neurological:     General: No focal deficit present.     Mental Status: He is alert and oriented to person, place, and time.  Psychiatric:        Mood and Affect: Mood normal.        Behavior: Behavior normal.     ED Results / Procedures / Treatments   Labs (all labs ordered are listed, but only abnormal results are displayed) Labs Reviewed - No data to display  EKG None  Radiology No results found.  Procedures Procedures   Medications Ordered in ED Medications - No data to display  ED Course  I have reviewed the triage vital signs and the nursing notes.  Pertinent labs & imaging results that were available during my care of the patient were reviewed by me and considered in my medical decision making (see  chart for details).  Clinical Course as of 08/16/21 [place] Aug 16, 2021  1238 Dr Doren Custard [BM]    Clinical Course User Index [BM] Gari Crown   MDM Rules/Calculators/A&P                            Vascular surgery did see pt.  Pt is on Eliquis and Plavix.  So plan for surgery on Thursday, 10/6.  He is to hold Eliquis and Plavix.  Vascular will rearrange dialysis if needed.  Pt is good with plan.  He is to return if worse.  Final Clinical Impression(s) / ED Diagnoses Final diagnoses:  ESRD (end stage renal disease) ([place])  Malfunction of arteriovenous graft, initial encounter [facility])    Rx / [place] Orders ED Discharge Orders     None        Isla Pence, MD 08/16/21 1449

## 2021-08-16 NOTE — Telephone Encounter (Signed)
Can he increase losartan to 37.5mg  daily  J Talah Cookston MD

## 2021-08-17 ENCOUNTER — Encounter (HOSPITAL_COMMUNITY): Payer: Self-pay | Admitting: Surgery

## 2021-08-17 ENCOUNTER — Other Ambulatory Visit: Payer: Self-pay

## 2021-08-17 ENCOUNTER — Telehealth: Payer: Self-pay | Admitting: Internal Medicine

## 2021-08-17 NOTE — Addendum Note (Signed)
Addended by: Nicholas Lose on: 08/17/2021 03:26 PM   Modules accepted: Orders

## 2021-08-17 NOTE — Telephone Encounter (Signed)
Sch per 9/12 los, pt aware

## 2021-08-17 NOTE — Progress Notes (Signed)
Anesthesia Chart Review:  Pt is a same day work up   Case: [id number] Date/Time: 08/18/21 0854   Procedure: REVISION OF LEFT ARTERIOVENOUS GORETEX GRAFT (Left) - PERIPHERAL NERVE BLOCK   Anesthesia type: Monitor Anesthesia Care   Pre-op diagnosis: MALFUNCTION OF AVG   Location: [facility] [facility] ROOM 44 / [facility]   Surgeons: Serafina Mitchell, MD       DISCUSSION: Pt is 75 years old with hx:  CAD (s/p DES x2 to LCx 09/2020; prior stent to LCx 1997) atrial fibrillation chronic systolic HF (EF is 41-74% by 12/2020 echo; cardiologist notes document pt hesitant to get ICD) HTN thoracic aortic arch aneurysm (4.5cm by 07/2021 CT) AAA (3.2cm infrarenal by 2020 CT, recheck in 3 years recommended) ESRD on hemodialysis Other hx includes: COPD, anemia, lung cancer, bladder cancer  Pt to stop eliquis and plavix 08/16/21   PROVIDERS: - PCP is [name], MD - Cardiologist is Carlyle Dolly, MD. Last office visit 07/08/21 - Urologist is Nicolette Bang, MD - Sees CT surgery for TAA, last office visit 10/21/20 with Lanelle Bal, MD. Note documents pt is high risk for aortic arch replacement or stenting and advises aortic arch can be followed by cardiology   LABS: Will be obtained day of surgery    IMAGES: Chest CT 07/20/21:  - Stable post radiation changes in left lung. No evidence of recurrent or metastatic carcinoma within the thorax. - Stable 4.5 cm thoracic aortic arch aneurysm. Recommend semi-annual imaging followup by CT and referral to cardiothoracic surgery if not already obtained.   - Aortic Atherosclerosis (ICD10-I70.0) and Emphysema (ICD10-J43.9).   CT abd/pelvis 04/17/19:  1. No urolithiasis. No hydronephrosis. No acute bladder abnormality. 2. Mild haziness of the peripancreatic fat. New fat stranding and ill-defined fluid throughout the anterior paranephric retroperitoneal spaces bilaterally extending into the pelvis. Acute pancreatitis cannot be excluded. Suggest correlation with serum lipase.  No biliary or pancreatic duct dilation on this noncontrast scan. 3. Trace dependent bilateral pleural effusions. 4. Midline supraumbilical ventral hernia contains a portion of the transverse colon. Left inguinal hernia contains a portion of the sigmoid colon. No acute bowel complication. 5. Small hiatal hernia. 6. Infrarenal 3.2 cm Abdominal Aortic Aneurysm (ICD10-I71.9). Recommend follow-up aortic ultrasound in 3 years.  7.  Aortic Atherosclerosis   EKG 05/24/21:  Sinus rhythm Nonspecific IVCD with LAD LVH with secondary repolarization abnormality Baseline wander in lead(s) II III aVF V3   CV: Echo 12/15/20:   1. Left ventricular ejection fraction, by estimation, is 25 to 30%. The left ventricle has severely decreased function. The left ventricle demonstrates global hypokinesis. The left ventricular internal cavity size was moderately to severely dilated.  Left ventricular diastolic parameters are indeterminate.   2. Right ventricular systolic function is normal. The right ventricular size is normal. Tricuspid regurgitation signal is inadequate for assessing PA pressure.   3. Left atrial size was moderately dilated.   4. Right atrial size was mildly dilated.   5. The mitral valve is grossly normal. Trivial mitral valve  regurgitation.   6. The aortic valve is tricuspid. Aortic valve regurgitation is not  visualized.   7. Aortic dilatation noted. There is borderline dilatation of the aortic root, measuring 39 mm.   8. The inferior vena cava is normal in size with greater than 50%  respiratory variability, suggesting right atrial pressure of 3 mmHg.    R/L cardiac cath 09/20/20:  Prox LAD to Mid LAD lesion is 40% stenosed. Mid Cx lesion is 50%  stenosed. Mid Cx to Dist Cx lesion is 90% stenosed. Prox Cx lesion is 50% stenosed. RV Branch lesion is 50% stenosed. A drug-eluting stent was successfully placed using a STENT RESOLUTE ONYX 3.0X34. Post intervention, there is a 0% residual  stenosis. Post intervention, there is a 0% residual stenosis. Post intervention, there is a 0% residual stenosis. A drug-eluting stent was successfully placed using a STENT RESOLUTE ONYX 4.0X18. LV end diastolic pressure is normal. Hemodynamic findings consistent with mild pulmonary hypertension. 1. Left dominant circulation 2. Severe single vessel obstructive CAD involving the proximal to mid LCx 3. Normal LV filling pressures 4. Mild pulmonary HTN 5. Normal cardiac output 6. Successful PCI of the proximal to mid LCx with DES x 2 overlapping. Lesion modified with cutting balloon angioplasty and Shockwave therapy.  - Plan: DAPT for at least one year. Optimize medical therapy for CHF.    Past Medical History:  Diagnosis Date   AAA (abdominal aortic aneurysm)    3.2 cm by 2020 CT; recheck in 3 years recommended   Anemia    Blood transfusion without reported diagnosis    CAD (coronary artery disease)    Stent to [place]; DES x2 to CX 2021   Chronic kidney disease    ESRD on HD   COPD (chronic obstructive pulmonary disease) (HCC)    Degenerative joint disease (DJD) of lumbar spine    GERD (gastroesophageal reflux disease)    Gout    Hyperlipidemia    Hypertension    Hypothyroidism    Incisional hernia    abdomen   Leukocytosis    CHRONIC MILD   Myocardial infarction ([place])    1997   SCL CA dx'd 01/2019   Lung cancer   Thoracic aortic aneurysm    4.5cm aortic arch by 07/20/21 CT    Past Surgical History:  Procedure Laterality Date   ABDOMINAL AORTIC ANEURYSM REPAIR  2006   AV FISTULA PLACEMENT Left 04/25/2019   Procedure: ARTERIOVENOUS (AV) FISTULA CREATION LEFT ARM;  Surgeon: Angelia Mould, MD;  Location: [facility];  Service: Vascular;  Laterality: Left;   AV FISTULA PLACEMENT Left 05/19/2019   Procedure: CONVERSION OF LEFT ARM ARTERIOVENOUS FISTULA TO GRAFT;  Surgeon: Angelia Mould, MD;  Location: [facility];  Service: Vascular;  Laterality: Left;   BIOPSY   09/30/2018   Procedure: BIOPSY;  Surgeon: Danie Binder, MD;  [facility];  Service: Endoscopy;;  ascending colon   BIOPSY  07/27/2020   Procedure: BIOPSY;  Surgeon: Eloise Harman, DO;  [facility];  Service: Endoscopy;;  gastric   BUBBLE STUDY  09/23/2020   Procedure: BUBBLE STUDY;  Surgeon: Skeet Latch, MD;  Location: [place];  Service: Cardiovascular;;   CARDIOVERSION N/A 09/23/2020   Procedure: CARDIOVERSION;  Surgeon: Skeet Latch, MD;  Location: [place];  Service: Cardiovascular;  Laterality: N/A;   COLONOSCOPY  2008   COLONOSCOPY N/A 09/30/2018   External and internal hemorrhoids, six polyps removed, one ascending colon polypoid lesion biopsied. Six simple adenomas and one benign polypoid lesion. Colonoscopy Nov 2022.    COLONOSCOPY WITH PROPOFOL N/A 07/27/2020   non-bleeding internal hemorrhoids, sigmoid and descending colon diverticulosis, four 1-2 mm polyps in ascending colon, one 5 mm polyp in transverse colon. 3 year surveillance. Tubular adenomas.    CORONARY ANGIOPLASTY WITH STENT PLACEMENT  1997   MID CIRCUMFLEX   CORONARY STENT INTERVENTION N/A 09/20/2020   Procedure: CORONARY STENT INTERVENTION;  Surgeon: Martinique, Peter M, MD;  Location: [facility];  Service: Cardiovascular;  Laterality: N/A;   CYSTOSCOPY W/ RETROGRADES Bilateral 03/28/2021   Procedure: CYSTOSCOPY WITH RETROGRADE PYELOGRAM;  Surgeon: Cleon Gustin, MD;  Location: [facility];  Service: Urology;  Laterality: Bilateral;   ESOPHAGOGASTRODUODENOSCOPY (EGD) WITH PROPOFOL N/A 07/27/2020   Food in middle third of esophagus, gastritis s/p biopsy, nodular mucosa in lesser curvature of stomach s/p biopsy. Negative H.pylori.    HIP ARTHROPLASTY Right 04/30/2020   Procedure: ARTHROPLASTY  HIP (HEMIARTHROPLASTY);  Surgeon: Altamese Morrison, MD;  Location: [facility];  Service: Orthopedics;  Laterality: Right;   INTRAVASCULAR ULTRASOUND/IVUS N/A 09/20/2020   Procedure:  Intravascular Ultrasound/IVUS;  Surgeon: Martinique, Peter M, MD;  Location: [facility] [facility];  Service: Cardiovascular;  Laterality: N/A;   IR FLUORO GUIDE CV LINE RIGHT  04/22/2019   IR FLUORO GUIDE CV LINE RIGHT  05/01/2020   IR THORACENTESIS ASP PLEURAL SPACE W/IMG GUIDE  09/22/2020   IR THROMBECTOMY AV FISTULA W/THROMBOLYSIS/PTA INC/SHUNT/IMG LEFT Left 05/03/2020   IR US GUIDE VASC ACCESS LEFT  05/03/2020   IR US GUIDE VASC ACCESS RIGHT  04/22/2019   IR US GUIDE VASC ACCESS RIGHT  05/01/2020   POLYPECTOMY  09/30/2018   Procedure: POLYPECTOMY;  Surgeon: Danie Binder, MD;  [facility];  Service: Endoscopy;;  colon   POLYPECTOMY  07/27/2020   Procedure: POLYPECTOMY;  Surgeon: Eloise Harman, DO;  [facility];  Service: Endoscopy;;   RIGHT/LEFT HEART CATH AND CORONARY ANGIOGRAPHY N/A 09/20/2020   Procedure: RIGHT/LEFT HEART CATH AND CORONARY ANGIOGRAPHY;  Surgeon: Martinique, Peter M, MD;  Location: [facility];  Service: Cardiovascular;  Laterality: N/A;   TEE WITHOUT CARDIOVERSION N/A 09/23/2020   Procedure: TRANSESOPHAGEAL ECHOCARDIOGRAM (TEE);  Surgeon: Skeet Latch, MD;  Location: [place];  Service: Cardiovascular;  Laterality: N/A;   TRANSURETHRAL RESECTION OF BLADDER TUMOR N/A 03/28/2021   Procedure: TRANSURETHRAL RESECTION OF BLADDER TUMOR (TURBT);  Surgeon: Cleon Gustin, MD;  [facility];  Service: Urology;  Laterality: N/A;   VIDEO BRONCHOSCOPY WITH ENDOBRONCHIAL NAVIGATION N/A 01/27/2019   Procedure: VIDEO BRONCHOSCOPY WITH ENDOBRONCHIAL NAVIGATION;  Surgeon: Grace Isaac, MD;  Location: [facility];  Service: Thoracic;  Laterality: N/A;   VIDEO BRONCHOSCOPY WITH ENDOBRONCHIAL ULTRASOUND N/A 01/27/2019   Procedure: VIDEO BRONCHOSCOPY WITH ENDOBRONCHIAL ULTRASOUND;  Surgeon: Grace Isaac, MD;  Location: [facility];  Service: Thoracic;  Laterality: N/A;    MEDICATIONS: No current facility-administered medications for this encounter.     acetaminophen (TYLENOL) 325 MG tablet   albuterol (VENTOLIN HFA) 108 (90 Base) MCG/ACT inhaler   allopurinol (ZYLOPRIM) 100 MG tablet   amiodarone (PACERONE) 200 MG tablet   atorvastatin (LIPITOR) 40 MG tablet   AURYXIA 1 GM 210 MG(Fe) tablet   B Complex-C-Zn-Folic Acid (DIALYVITE 637-CHYI 15) 0.8 MG TABS   clopidogrel (PLAVIX) 75 MG tablet   ELIQUIS 2.5 MG TABS tablet   fexofenadine (ALLEGRA) 180 MG tablet   levothyroxine (SYNTHROID, LEVOTHROID) 175 MCG tablet   lidocaine-prilocaine (EMLA) cream   losartan (COZAAR) 25 MG tablet   metoprolol succinate (TOPROL-XL) 25 MG 24 hr tablet   omeprazole (PRILOSEC) 40 MG capsule   cephALEXin (KEFLEX) 500 MG capsule   - Pt to stop eliquis and plavix 08/16/21  If labs acceptable day of surgery and no acute changes, I anticipate pt can proceed with surgery as scheduled.  Willeen Cass, PhD, FNP-BC [facility]/Anesthesiology Phone: [phone] 08/17/2021 12:42 PM

## 2021-08-17 NOTE — Progress Notes (Signed)
PCP - Dr. Salena Saner  Cardiologist - Dr. Zandra Abts  EP-Denies  Endocrine-Denies  Pulm-Denies  Chest x-ray - 05/24/21- (E)  EKG - 05/24/21 (E)  Stress Test - Denies  ECHO - 12/15/20 (E)  Cardiac Cath - 09/20/20 (E)  AICD-na PM-na LOOP-na  Dialysis- T-TH-Sat- last dialysis on 08/16/21  Sleep Study - Denies CPAP - Denies  LABS- 08/18/21: I-Stat-8  ASA- Denies Eliquis- LD- 10/4 Plavix- LD- 10/4  ERAS- No  HA1C-Denies  Anesthesia- Yes- cardiac history, recent ER visit  Pt denies having chest pain, sob, or fever during the pre-op phone call. All instructions explained to the pt, with a verbal understanding of the material including: as of today, stop taking all Aspirin (unless instructed by your doctor) and Other Aspirin containing products, Vitamins, Fish oils, and Herbal medications. Also stop all NSAIDS i.e. Advil, Ibuprofen, Motrin, Aleve, Anaprox, Naproxen, BC, Goody Powders, and all Supplements. Pt also instructed to wear a mask and social distance if he needs to go out prior to surgery. The opportunity to ask questions was provided.    Coronavirus Screening  Have you experienced the following symptoms:  Cough yes/no: No Fever (>100.1F)  yes/no: No Runny nose yes/no: No Sore throat yes/no: No Difficulty breathing/shortness of breath  yes/no: No  Have you or a family member traveled in the last 14 days and where? yes/no: No   If the patient indicates "YES" to the above questions, their PAT will be rescheduled to limit the exposure to others and, the surgeon will be notified. THE PATIENT WILL NEED TO BE ASYMPTOMATIC FOR 14 DAYS.   If the patient is not experiencing any of these symptoms, the PAT nurse will instruct them to NOT bring anyone with them to their appointment since they may have these symptoms or traveled as well.   Please remind your patients and families that hospital visitation restrictions are in effect and the importance of the restrictions.

## 2021-08-17 NOTE — Anesthesia Preprocedure Evaluation (Addendum)
Anesthesia Evaluation  Patient identified by MRN, date of birth, ID band Patient awake    Reviewed: Allergy & Precautions, NPO status , Patient's Chart, lab work & pertinent test results  Airway Mallampati: II  TM Distance: >3 FB Neck ROM: Full    Dental  (+) Dental Advisory Given   Pulmonary COPD, former smoker,    breath sounds clear to auscultation       Cardiovascular hypertension, Pt. on medications and Pt. on home beta blockers + CAD, + Past MI, + Peripheral Vascular Disease and +CHF   Rhythm:Regular Rate:Normal     Neuro/Psych negative neurological ROS     GI/Hepatic Neg liver ROS, GERD  ,  Endo/Other  Hypothyroidism   Renal/GU ESRF and DialysisRenal disease     Musculoskeletal  (+) Arthritis ,   Abdominal   Peds  Hematology  (+) anemia ,   Anesthesia Other Findings   Reproductive/Obstetrics                            Anesthesia Physical Anesthesia Plan  ASA: 4  Anesthesia Plan: Regional   Post-op Pain Management:    Induction:   PONV Risk Score and Plan: 1 and Propofol infusion, Ondansetron and Treatment may vary due to age or medical condition  Airway Management Planned: Natural Airway and Simple Face Mask  Additional Equipment: None  Intra-op Plan:   Post-operative Plan:   Informed Consent: I have reviewed the patients History and Physical, chart, labs and discussed the procedure including the risks, benefits and alternatives for the proposed anesthesia with the patient or authorized representative who has indicated his/her understanding and acceptance.       Plan Discussed with: CRNA  Anesthesia Plan Comments:        Anesthesia Quick Evaluation

## 2021-08-18 ENCOUNTER — Ambulatory Visit (HOSPITAL_COMMUNITY): Payer: Medicare Other | Admitting: Emergency Medicine

## 2021-08-18 ENCOUNTER — Encounter (HOSPITAL_COMMUNITY): Payer: Self-pay | Admitting: Surgery

## 2021-08-18 ENCOUNTER — Ambulatory Visit (HOSPITAL_COMMUNITY)
Admission: RE | Admit: 2021-08-18 | Discharge: 2021-08-18 | Disposition: A | Discharge status: Home / Self Care | Payer: Medicare Other | Attending: Surgery | Admitting: Surgery

## 2021-08-18 ENCOUNTER — Encounter (HOSPITAL_COMMUNITY)
Admission: RE | Disposition: A | Discharge status: Home / Self Care | Payer: Self-pay | Source: Home / Self Care | Attending: Surgery

## 2021-08-18 DIAGNOSIS — I5021 Acute systolic (congestive) heart failure: Secondary | ICD-10-CM | POA: Diagnosis not present

## 2021-08-18 DIAGNOSIS — L98499 Non-pressure chronic ulcer of skin of other sites with unspecified severity: Secondary | ICD-10-CM | POA: Diagnosis not present

## 2021-08-18 DIAGNOSIS — N186 End stage renal disease: Secondary | ICD-10-CM | POA: Insufficient documentation

## 2021-08-18 DIAGNOSIS — Z7902 Long term (current) use of antithrombotics/antiplatelets: Secondary | ICD-10-CM | POA: Diagnosis not present

## 2021-08-18 DIAGNOSIS — Z992 Dependence on renal dialysis: Secondary | ICD-10-CM | POA: Diagnosis not present

## 2021-08-18 DIAGNOSIS — Z85118 Personal history of other malignant neoplasm of bronchus and lung: Secondary | ICD-10-CM | POA: Insufficient documentation

## 2021-08-18 DIAGNOSIS — Z79899 Other long term (current) drug therapy: Secondary | ICD-10-CM | POA: Insufficient documentation

## 2021-08-18 DIAGNOSIS — T82590A Other mechanical complication of surgically created arteriovenous fistula, initial encounter: Secondary | ICD-10-CM | POA: Diagnosis not present

## 2021-08-18 DIAGNOSIS — Z87891 Personal history of nicotine dependence: Secondary | ICD-10-CM | POA: Diagnosis not present

## 2021-08-18 DIAGNOSIS — Y841 Kidney dialysis as the cause of abnormal reaction of the patient, or of later complication, without mention of misadventure at the time of the procedure: Secondary | ICD-10-CM | POA: Insufficient documentation

## 2021-08-18 DIAGNOSIS — I132 Hypertensive heart and chronic kidney disease with heart failure and with stage 5 chronic kidney disease, or end stage renal disease: Secondary | ICD-10-CM | POA: Insufficient documentation

## 2021-08-18 DIAGNOSIS — Z79890 Hormone replacement therapy (postmenopausal): Secondary | ICD-10-CM | POA: Insufficient documentation

## 2021-08-18 DIAGNOSIS — I509 Heart failure, unspecified: Secondary | ICD-10-CM | POA: Insufficient documentation

## 2021-08-18 DIAGNOSIS — T82510A Breakdown (mechanical) of surgically created arteriovenous fistula, initial encounter: Secondary | ICD-10-CM | POA: Diagnosis not present

## 2021-08-18 DIAGNOSIS — Z7901 Long term (current) use of anticoagulants: Secondary | ICD-10-CM | POA: Diagnosis not present

## 2021-08-18 DIAGNOSIS — T82898A Other specified complication of vascular prosthetic devices, implants and grafts, initial encounter: Secondary | ICD-10-CM | POA: Diagnosis not present

## 2021-08-18 DIAGNOSIS — Z888 Allergy status to other drugs, medicaments and biological substances status: Secondary | ICD-10-CM | POA: Insufficient documentation

## 2021-08-18 DIAGNOSIS — Z880 Allergy status to penicillin: Secondary | ICD-10-CM | POA: Insufficient documentation

## 2021-08-18 HISTORY — DX: Thoracic aortic aneurysm, without rupture, unspecified: I71.20

## 2021-08-18 HISTORY — DX: Abdominal aortic aneurysm, without rupture, unspecified: I71.40

## 2021-08-18 HISTORY — PX: REVISION OF ARTERIOVENOUS GORETEX GRAFT: SHX6073

## 2021-08-18 LAB — POCT I-STAT, CHEM 8
BUN: 42 mg/dL — ABNORMAL HIGH (ref 8–23)
Calcium, Ion: 1.07 mmol/L — ABNORMAL LOW (ref 1.15–1.40)
Chloride: 96 mmol/L — ABNORMAL LOW (ref 98–111)
Creatinine, Ser: 8.5 mg/dL — ABNORMAL HIGH (ref 0.61–1.24)
Glucose, Bld: 84 mg/dL (ref 70–99)
HCT: 35 % — ABNORMAL LOW (ref 39.0–52.0)
Hemoglobin: 11.9 g/dL — ABNORMAL LOW (ref 13.0–17.0)
Potassium: 4.6 mmol/L (ref 3.5–5.1)
Sodium: 135 mmol/L (ref 135–145)
TCO2: 29 mmol/L (ref 22–32)

## 2021-08-18 SURGERY — REVISION OF ARTERIOVENOUS GORETEX GRAFT
Anesthesia: Regional | Site: Arm Upper | Laterality: Left

## 2021-08-18 MED ORDER — ONDANSETRON HCL 4 MG/2ML IJ SOLN
INTRAMUSCULAR | Status: AC
  Filled 2021-08-18: qty 2

## 2021-08-18 MED ORDER — MIDAZOLAM HCL 2 MG/2ML IJ SOLN
INTRAMUSCULAR | Status: AC
  Filled 2021-08-18: qty 2

## 2021-08-18 MED ORDER — EPHEDRINE SULFATE 50 MG/ML IJ SOLN
INTRAMUSCULAR | Status: DC | PRN
  Administered 2021-08-18: 10 mg via INTRAVENOUS
  Administered 2021-08-18: 5 mg via INTRAVENOUS

## 2021-08-18 MED ORDER — SODIUM CHLORIDE 0.9 % IR SOLN
Status: DC | PRN
  Administered 2021-08-18: 1

## 2021-08-18 MED ORDER — LIDOCAINE 2% (20 MG/ML) 5 ML SYRINGE
INTRAMUSCULAR | Status: DC | PRN
  Administered 2021-08-18: 20 mg via INTRAVENOUS

## 2021-08-18 MED ORDER — HEMOSTATIC AGENTS (NO CHARGE) OPTIME
TOPICAL | Status: DC | PRN
  Administered 2021-08-18: 1 via TOPICAL

## 2021-08-18 MED ORDER — VANCOMYCIN HCL IN DEXTROSE 1-5 GM/200ML-% IV SOLN
1000.0000 mg | INTRAVENOUS | Status: AC
  Administered 2021-08-18: 1000 mg via INTRAVENOUS
  Filled 2021-08-18: qty 200

## 2021-08-18 MED ORDER — FENTANYL CITRATE (PF) 100 MCG/2ML IJ SOLN
INTRAMUSCULAR | Status: DC | PRN
  Administered 2021-08-18: 50 ug via INTRAVENOUS

## 2021-08-18 MED ORDER — HEPARIN 6000 UNIT IRRIGATION SOLUTION
Status: AC
  Filled 2021-08-18: qty 500

## 2021-08-18 MED ORDER — SODIUM CHLORIDE 0.9 % IV SOLN: INTRAVENOUS | Status: DC

## 2021-08-18 MED ORDER — HEPARIN SODIUM (PORCINE) 1000 UNIT/ML IJ SOLN
INTRAMUSCULAR | Status: DC | PRN
  Administered 2021-08-18: 3000 [IU] via INTRAVENOUS

## 2021-08-18 MED ORDER — FENTANYL CITRATE (PF) 100 MCG/2ML IJ SOLN: 100.0000 ug | Freq: Once | INTRAMUSCULAR | Status: AC

## 2021-08-18 MED ORDER — LIDOCAINE 2% (20 MG/ML) 5 ML SYRINGE
INTRAMUSCULAR | Status: AC
  Filled 2021-08-18: qty 5

## 2021-08-18 MED ORDER — CHLORHEXIDINE GLUCONATE 4 % EX LIQD: 60.0000 mL | Freq: Once | CUTANEOUS | Status: DC

## 2021-08-18 MED ORDER — HYDROCODONE-ACETAMINOPHEN 5-325 MG PO TABS: 1.0000 | ORAL_TABLET | ORAL | 0 refills | Status: DC | PRN

## 2021-08-18 MED ORDER — LIDOCAINE-EPINEPHRINE (PF) 1 %-1:200000 IJ SOLN
INTRAMUSCULAR | Status: AC
  Filled 2021-08-18: qty 30

## 2021-08-18 MED ORDER — PROTAMINE SULFATE 10 MG/ML IV SOLN
INTRAVENOUS | Status: DC | PRN
  Administered 2021-08-18: 25 mg via INTRAVENOUS

## 2021-08-18 MED ORDER — CHLORHEXIDINE GLUCONATE 0.12 % MT SOLN
OROMUCOSAL | Status: AC
  Administered 2021-08-18: 15 mL
  Filled 2021-08-18: qty 15

## 2021-08-18 MED ORDER — HEPARIN 6000 UNIT IRRIGATION SOLUTION
Status: DC | PRN
  Administered 2021-08-18: 1

## 2021-08-18 MED ORDER — ONDANSETRON HCL 4 MG/2ML IJ SOLN
INTRAMUSCULAR | Status: DC | PRN
  Administered 2021-08-18: 4 mg via INTRAVENOUS

## 2021-08-18 MED ORDER — LIDOCAINE-EPINEPHRINE (PF) 1.5 %-1:200000 IJ SOLN
INTRAMUSCULAR | Status: DC | PRN
  Administered 2021-08-18: 30 mL via PERINEURAL

## 2021-08-18 MED ORDER — FENTANYL CITRATE (PF) 100 MCG/2ML IJ SOLN
INTRAMUSCULAR | Status: AC
  Administered 2021-08-18: 100 ug via INTRAVENOUS
  Filled 2021-08-18: qty 2

## 2021-08-18 MED ORDER — PROPOFOL 500 MG/50ML IV EMUL
INTRAVENOUS | Status: DC | PRN
  Administered 2021-08-18: 125 ug/kg/min via INTRAVENOUS

## 2021-08-18 MED ORDER — FENTANYL CITRATE (PF) 250 MCG/5ML IJ SOLN
INTRAMUSCULAR | Status: AC
  Filled 2021-08-18: qty 5

## 2021-08-18 SURGICAL SUPPLY — 40 items
ADH SKN CLS APL DERMABOND .7 (GAUZE/BANDAGES/DRESSINGS) ×1
AGENT HMST KT MTR STRL THRMB (HEMOSTASIS) ×1
BAG COUNTER SPONGE SURGICOUNT (BAG) ×2 IMPLANT
BAG SPNG CNTER NS LX DISP (BAG) ×1
CANISTER SUCT 3000ML PPV (MISCELLANEOUS) ×2 IMPLANT
CLIP VESOCCLUDE MED 6/CT (CLIP) ×2 IMPLANT
CLIP VESOCCLUDE SM WIDE 6/CT (CLIP) ×2 IMPLANT
DERMABOND ADVANCED (GAUZE/BANDAGES/DRESSINGS) ×1
DERMABOND ADVANCED .7 DNX12 (GAUZE/BANDAGES/DRESSINGS) ×1 IMPLANT
DRSG COVADERM 4X6 (GAUZE/BANDAGES/DRESSINGS) ×1 IMPLANT
ELECT REM PT RETURN 9FT ADLT (ELECTROSURGICAL) ×2
ELECTRODE REM PT RTRN 9FT ADLT (ELECTROSURGICAL) ×1 IMPLANT
GAUZE SPONGE 4X4 12PLY STRL (GAUZE/BANDAGES/DRESSINGS) ×1 IMPLANT
GLOVE SRG 8 PF TXTR STRL LF DI (GLOVE) ×1 IMPLANT
GLOVE SURG MICRO LTX SZ6.5 (GLOVE) ×1 IMPLANT
GLOVE SURG POLYISO LF SZ7.5 (GLOVE) ×2 IMPLANT
GLOVE SURG UNDER POLY LF SZ8 (GLOVE) ×2
GOWN STRL REUS W/ TWL LRG LVL3 (GOWN DISPOSABLE) ×2 IMPLANT
GOWN STRL REUS W/ TWL XL LVL3 (GOWN DISPOSABLE) ×1 IMPLANT
GOWN STRL REUS W/TWL LRG LVL3 (GOWN DISPOSABLE) ×4
GOWN STRL REUS W/TWL XL LVL3 (GOWN DISPOSABLE) ×2
HEMOSTAT SNOW SURGICEL 2X4 (HEMOSTASIS) IMPLANT
KIT BASIN OR (CUSTOM PROCEDURE TRAY) ×2 IMPLANT
KIT TURNOVER KIT B (KITS) ×2 IMPLANT
NDL HYPO 25GX1X1/2 BEV (NEEDLE) ×1 IMPLANT
NEEDLE HYPO 25GX1X1/2 BEV (NEEDLE) ×2 IMPLANT
NS IRRIG 1000ML POUR BTL (IV SOLUTION) ×2 IMPLANT
PACK CV ACCESS (CUSTOM PROCEDURE TRAY) ×2 IMPLANT
PAD ARMBOARD 7.5X6 YLW CONV (MISCELLANEOUS) ×4 IMPLANT
SPONGE T-LAP 18X18 ~~LOC~~+RFID (SPONGE) ×1 IMPLANT
SURGIFLO W/THROMBIN 8M KIT (HEMOSTASIS) ×1 IMPLANT
SUT ETHILON 2 0 FS 18 (SUTURE) ×1 IMPLANT
SUT PROLENE 5 0 C 1 24 (SUTURE) ×3 IMPLANT
SUT PROLENE 6 0 BV (SUTURE) ×4 IMPLANT
SUT VIC AB 3-0 SH 27 (SUTURE) ×4
SUT VIC AB 3-0 SH 27X BRD (SUTURE) ×2 IMPLANT
SUT VICRYL 4-0 PS2 18IN ABS (SUTURE) IMPLANT
TOWEL GREEN STERILE (TOWEL DISPOSABLE) ×2 IMPLANT
UNDERPAD 30X36 HEAVY ABSORB (UNDERPADS AND DIAPERS) ×2 IMPLANT
WATER STERILE IRR 1000ML POUR (IV SOLUTION) ×2 IMPLANT

## 2021-08-18 NOTE — Anesthesia Procedure Notes (Signed)
Anesthesia Regional Block: Axillary brachial plexus block   Pre-Anesthetic Checklist: , timeout performed,  Correct Patient, Correct Site, Correct Laterality,  Correct Procedure, Correct Position, site marked,  Risks and benefits discussed,  Surgical consent,  Pre-op evaluation,  At surgeon's request and post-op pain management  Laterality: Left  Prep: chloraprep       Needles:  Injection technique: Single-shot  Needle Type: Echogenic Stimulator Needle     Needle Length: 9cm  Needle Gauge: 21     Additional Needles:   Procedures:, nerve stimulator,,, ultrasound used (permanent image in chart),,     Nerve Stimulator or Paresthesia:  Response: biceps, triceps, wrist flexion, 0.5 mA  Additional Responses:   Narrative:  Start time: 08/18/2021 8:10 AM End time: 08/18/2021 8:16 AM Injection made incrementally with aspirations every 5 mL.  Performed by: Personally  Anesthesiologist: Suzette Battiest, MD

## 2021-08-18 NOTE — Anesthesia Postprocedure Evaluation (Signed)
Anesthesia Post Note  Patient: Jeremy Johnson  Procedure(s) Performed: REVISION OF LEFT ARTERIOVENOUS GORETEX GRAFT (Left: Arm Upper)     Patient location during evaluation: PACU Anesthesia Type: Regional Level of consciousness: awake and alert Pain management: pain level controlled Vital Signs Assessment: post-procedure vital signs reviewed and stable Respiratory status: spontaneous breathing, nonlabored ventilation, respiratory function stable and patient connected to nasal cannula oxygen Cardiovascular status: stable and blood pressure returned to baseline Postop Assessment: no apparent nausea or vomiting Anesthetic complications: no   No notable events documented.  Last Vitals:  Vitals:   08/18/21 1050 08/18/21 1105  BP: 129/64 140/77  Pulse: 68 64  Resp: (!) 23 (!) 24  Temp:  36.4 C  SpO2: 97% 97%    Last Pain:  Vitals:   08/18/21 1105  TempSrc:   PainSc: 3                  Tiajuana Amass

## 2021-08-18 NOTE — Op Note (Signed)
    Patient name: Jeremy Johnson MRN: [id number] DOB: 05-09-1946 Sex: male  08/18/2021 Pre-operative Diagnosis: End-stage renal disease with ulcer on left arm graft Post-operative diagnosis:  Same Surgeon:  [name] Assistants:  Carin Primrose, PA Procedure:   Excision of left upper arm dialysis graft ulcer with graft revision Anesthesia:  regional Blood Loss:  minimal Specimens:  none  Findings: I excised the ulcer overlying the graft.  There was no evidence of infection.  I was able to primarily repair a small defect on the anterior surface of the graft  Indications: This is a 75 year old gentleman with end-stage renal disease who has a ulcer overlying his dialysis graft.  He comes in today for revision  Procedure:  The patient was identified in the holding area and taken to [place] [address]  The patient was then placed supine on the table. regional anesthesia was administered.  The patient was prepped and draped in the usual sterile fashion.  A time out was called and antibiotics were administered.  A PA was necessary to expedite the procedure and assist with technical details.  I made an elliptical incision incorporating the ulcer of the graft.  Cautery was used divide subcutaneous tissue.  I then fully exposed the graft proximal and distal to the ulcerated area.  I then gave the patient 3000 units of heparin.  Baby Gregory clamps were placed proximal and distal on the graft.  I then used a 15 blade to excise the ulcer with the associated skin paddle.  Once this was done, there was a 2 mm defect on the anterior surface of the graft.  I contemplated an interposition graft or rerouting the graft laterally however since there was no infection present and the integrity of the graft was good except for the defect, I elected to primarily close the graft.  This was done with a running 5-0 Prolene in transverse direction.  Once this was completed the clamps were released.  There was a good thrill  within the fistula.  The wound was irrigated.  Hemostasis was achieved.  I did not feel that the skin integrity was good enough to close the wound with Vicryl and so I used a running 2-0 nylon suture.  Sterile dressings were applied.   Disposition: To PACU stable   V. [name], M.D., [facility] [place] Office: [phone] Pager:  [phone]

## 2021-08-18 NOTE — Anesthesia Procedure Notes (Signed)
Procedure Name: MAC Date/Time: 08/18/2021 9:14 AM Performed by: Barrington Ellison, CRNA Pre-anesthesia Checklist: Patient identified, Emergency Drugs available, Suction available and Patient being monitored Patient Re-evaluated:Patient Re-evaluated prior to induction Oxygen Delivery Method: Simple face mask

## 2021-08-18 NOTE — Interval H&P Note (Signed)
History and Physical Interval Note:  08/18/2021 7:39 AM  Jeremy Johnson  has presented today for surgery, with the diagnosis of MALFUNCTION OF AVG.  The various methods of treatment have been discussed with the patient and family. After consideration of risks, benefits and other options for treatment, the patient has consented to  Procedure(s) with comments: REVISION OF LEFT ARTERIOVENOUS GORETEX GRAFT (Left) - PERIPHERAL NERVE BLOCK as a surgical intervention.  The patient's history has been reviewed, patient examined, no change in status, stable for surgery.  I have reviewed the patient's chart and labs.  Questions were answered to the patient's satisfaction.     [name]

## 2021-08-18 NOTE — Transfer of Care (Signed)
Immediate Anesthesia Transfer of Care Note  Patient: Jeremy Johnson  Procedure(s) Performed: REVISION OF LEFT ARTERIOVENOUS GORETEX GRAFT (Left: Arm Upper)  Patient Location: PACU  Anesthesia Type:MAC and Regional  Level of Consciousness: awake, alert  and oriented  Airway & Oxygen Therapy: Patient Spontanous Breathing  Post-op Assessment: Report given to RN  Post vital signs: Reviewed and stable  Last Vitals:  Vitals Value Taken Time  BP 115/66 08/18/21 1019  Temp    Pulse 76 08/18/21 1020  Resp 31 08/18/21 1020  SpO2 97 % 08/18/21 1020  Vitals shown include unvalidated device data.  Last Pain:  Vitals:   08/18/21 0808  TempSrc:   PainSc: 0-No pain      Patients Stated Pain Goal: 3 (10/62/69 4854)  Complications: No notable events documented.

## 2021-08-19 ENCOUNTER — Encounter (HOSPITAL_COMMUNITY): Payer: Self-pay | Admitting: Surgery

## 2021-08-20 DIAGNOSIS — N186 End stage renal disease: Secondary | ICD-10-CM | POA: Diagnosis not present

## 2021-08-20 DIAGNOSIS — Z992 Dependence on renal dialysis: Secondary | ICD-10-CM | POA: Diagnosis not present

## 2021-08-20 DIAGNOSIS — R52 Pain, unspecified: Secondary | ICD-10-CM | POA: Diagnosis not present

## 2021-08-20 DIAGNOSIS — E039 Hypothyroidism, unspecified: Secondary | ICD-10-CM | POA: Diagnosis not present

## 2021-08-20 DIAGNOSIS — N2581 Secondary hyperparathyroidism of renal origin: Secondary | ICD-10-CM | POA: Diagnosis not present

## 2021-08-20 DIAGNOSIS — D631 Anemia in chronic kidney disease: Secondary | ICD-10-CM | POA: Diagnosis not present

## 2021-08-20 DIAGNOSIS — D689 Coagulation defect, unspecified: Secondary | ICD-10-CM | POA: Diagnosis not present

## 2021-08-22 NOTE — Patient Instructions (Signed)
Jeremy Johnson  08/22/2021     @PREFPERIOPPHARMACY @   Your procedure is scheduled on  08/29/2021.   Report to [facility] at  [phone]  A.M.   Call this number if you have problems the morning of surgery:  [phone]   Remember:  Do not eat or drink after midnight.      Take these medicines the morning of surgery with A SIP OF WATER      allopurinol, amiodarone, auryxia, hydrocodone (if needed), levothyroxine, metoprolol, prilosec.     Do not wear jewelry, make-up or nail polish.  Do not wear lotions, powders, or perfumes, or deodorant.  Do not shave 48 hours prior to surgery.  Men may shave face and neck.  Do not bring valuables to the hospital.  [facility] is not responsible for any belongings or valuables.  Contacts, dentures or bridgework may not be worn into surgery.  Leave your suitcase in the car.  After surgery it may be brought to your room.  For patients admitted to the hospital, discharge time will be determined by your treatment team.  Patients discharged the day of surgery will not be allowed to drive home and must have someone with them for 24 hours.    Special instructions:   DO NOT smoke tobacco or vape for 24 hours before your procedure.  Please read over the following fact sheets that you were given. Coughing and Deep Breathing, Surgical Site Infection Prevention, Anesthesia Post-op Instructions, and Care and Recovery After Surgery      Transurethral Resection of Bladder Tumor, Care After This sheet gives you information about how to care for yourself after your procedure. Your health care provider may also give you more specific instructions. If you have problems or questions, contact your health care provider. What can I expect after the procedure? After the procedure, it is common to have: A small amount of blood in your urine for up to 2 weeks. Soreness or mild pain from your catheter. After your catheter is removed, you may have  mild soreness, especially when urinating. Pain in your lower abdomen. Follow these instructions at home: Medicines  Take over-the-counter and prescription medicines only as told by your health care provider. If you were prescribed an antibiotic medicine, take it as told by your health care provider. Do not stop taking the antibiotic even if you start to feel better. Do not drive for 24 hours if you were given a sedative during your procedure. Ask your health care provider if the medicine prescribed to you: Requires you to avoid driving or using heavy machinery. Can cause constipation. You may need to take these actions to prevent or treat constipation: Take over-the-counter or prescription medicines. Eat foods that are high in fiber, such as beans, whole grains, and fresh fruits and vegetables. Limit foods that are high in fat and processed sugars, such as fried or sweet foods. Activity Return to your normal activities as told by your health care provider. Ask your health care provider what activities are safe for you. Do not lift anything that is heavier than 10 lb (4.5 kg), or the limit that you are told, until your health care provider says that it is safe. Avoid intense physical activity for as long as told by your health care provider. Rest as told by your health care provider. Avoid sitting for a long time without moving. Get up to take short walks every 1-2 hours. This is  important to improve blood flow and breathing. Ask for help if you feel weak or unsteady. General instructions  Do not drink alcohol for as long as told by your health care provider. This is especially important if you are taking prescription pain medicines. Do not take baths, swim, or use a hot tub until your health care provider approves. Ask your health care provider if you may take showers. You may only be allowed to take sponge baths. If you have a catheter, follow instructions from your health care provider  about caring for your catheter and your drainage bag. Drink enough fluid to keep your urine pale yellow. Wear compression stockings as told by your health care provider. These stockings help to prevent blood clots and reduce swelling in your legs. Keep all follow-up visits as told by your health care provider. This is important. You will need to be followed closely with regular checks of your bladder and urethra (cystoscopies) to make sure that the cancer does not come back. Contact a health care provider if: You have pain that gets worse or does not improve with medicine. You have blood in your urine for more than 2 weeks. You have cloudy or bad-smelling urine. You become constipated. Signs of constipation may include having: Fewer than three bowel movements in a week. Difficulty having a bowel movement. Stools that are dry, hard, or larger than normal. You have a fever. Get help right away if: You have: Severe pain. Bright red blood in your urine. Blood clots in your urine. A lot of blood in your urine. Your catheter has been removed and you are not able to urinate. You have a catheter in place and the catheter is not draining urine. Summary After your procedure, it is common to have a small amount of blood in your urine, soreness or mild pain from your catheter, and pain in your lower abdomen. Take over-the-counter and prescription medicines only as told by your health care provider. Rest as told by your health care provider. Follow your health care provider's instructions about returning to normal activities. Ask what activities are safe for you. If you have a catheter, follow instructions from your health care provider about caring for your catheter and your drainage bag. Get help right away if you cannot urinate, you have severe pain, or you have bright red blood or blood clots in your urine. This information is not intended to replace advice given to you by your health care  provider. Make sure you discuss any questions you have with your health care provider. Document Revised: 05/30/2018 Document Reviewed: 05/30/2018 Elsevier Patient Education  [place] Anesthesia, Adult, Care After This sheet gives you information about how to care for yourself after your procedure. Your health care provider may also give you more specific instructions. If you have problems or questions, contact your health care provider. What can I expect after the procedure? After the procedure, the following side effects are common: Pain or discomfort at the IV site. Nausea. Vomiting. Sore throat. Trouble concentrating. Feeling cold or chills. Feeling weak or tired. Sleepiness and fatigue. Soreness and body aches. These side effects can affect parts of the body that were not involved in surgery. Follow these instructions at home: For the time period you were told by your health care provider:  Rest. Do not participate in activities where you could fall or become injured. Do not drive or use machinery. Do not drink alcohol. Do not take sleeping pills or medicines  that cause drowsiness. Do not make important decisions or sign legal documents. Do not take care of children on your own. Eating and drinking Follow any instructions from your health care provider about eating or drinking restrictions. When you feel hungry, start by eating small amounts of foods that are soft and easy to digest (bland), such as toast. Gradually return to your regular diet. Drink enough fluid to keep your urine pale yellow. If you vomit, rehydrate by drinking water, juice, or clear broth. General instructions If you have sleep apnea, surgery and certain medicines can increase your risk for breathing problems. Follow instructions from your health care provider about wearing your sleep device: Anytime you are sleeping, including during daytime naps. While taking prescription pain medicines,  sleeping medicines, or medicines that make you drowsy. Have a responsible adult stay with you for the time you are told. It is important to have someone help care for you until you are awake and alert. Return to your normal activities as told by your health care provider. Ask your health care provider what activities are safe for you. Take over-the-counter and prescription medicines only as told by your health care provider. If you smoke, do not smoke without supervision. Keep all follow-up visits as told by your health care provider. This is important. Contact a health care provider if: You have nausea or vomiting that does not get better with medicine. You cannot eat or drink without vomiting. You have pain that does not get better with medicine. You are unable to pass urine. You develop a skin rash. You have a fever. You have redness around your IV site that gets worse. Get help right away if: You have difficulty breathing. You have chest pain. You have blood in your urine or stool, or you vomit blood. Summary After the procedure, it is common to have a sore throat or nausea. It is also common to feel tired. Have a responsible adult stay with you for the time you are told. It is important to have someone help care for you until you are awake and alert. When you feel hungry, start by eating small amounts of foods that are soft and easy to digest (bland), such as toast. Gradually return to your regular diet. Drink enough fluid to keep your urine pale yellow. Return to your normal activities as told by your health care provider. Ask your health care provider what activities are safe for you. This information is not intended to replace advice given to you by your health care provider. Make sure you discuss any questions you have with your health care provider. Document Revised: 07/15/2020 Document Reviewed: 02/12/2020 Elsevier Patient Education  2022 [place]. How to Use Chlorhexidine  for Bathing Chlorhexidine gluconate (CHG) is a germ-killing (antiseptic) solution that is used to clean the skin. It can get rid of the bacteria that normally live on the skin and can keep them away for about 24 hours. To clean your skin with CHG, you may be given: A CHG solution to use in the shower or as part of a sponge bath. A prepackaged cloth that contains CHG. Cleaning your skin with CHG may help lower the risk for infection: While you are staying in the intensive care unit of the hospital. If you have a vascular access, such as a central line, to provide short-term or long-term access to your veins. If you have a catheter to drain urine from your bladder. If you are on a ventilator. A ventilator is  a machine that helps you breathe by moving air in and out of your lungs. After surgery. What are the risks? Risks of using CHG include: A skin reaction. Hearing loss, if CHG gets in your ears and you have a perforated eardrum. Eye injury, if CHG gets in your eyes and is not rinsed out. The CHG product catching fire. Make sure that you avoid smoking and flames after applying CHG to your skin. Do not use CHG: If you have a chlorhexidine allergy or have previously reacted to chlorhexidine. On babies younger than 23 months of age. How to use CHG solution Use CHG only as told by your health care provider, and follow the instructions on the label. Use the full amount of CHG as directed. Usually, this is one bottle. During a shower Follow these steps when using CHG solution during a shower (unless your health care provider gives you different instructions): Start the shower. Use your normal soap and shampoo to wash your face and hair. Turn off the shower or move out of the shower stream. Pour the CHG onto a clean washcloth. Do not use any type of brush or rough-edged sponge. Starting at your neck, lather your body down to your toes. Make sure you follow these instructions: If you will be  having surgery, pay special attention to the part of your body where you will be having surgery. Scrub this area for at least 1 minute. Do not use CHG on your head or face. If the solution gets into your ears or eyes, rinse them well with water. Avoid your genital area. Avoid any areas of skin that have broken skin, cuts, or scrapes. Scrub your back and under your arms. Make sure to wash skin folds. Let the lather sit on your skin for 1-2 minutes or as long as told by your health care provider. Thoroughly rinse your entire body in the shower. Make sure that all body creases and crevices are rinsed well. Dry off with a clean towel. Do not put any substances on your body afterward--such as powder, lotion, or perfume--unless you are told to do so by your health care provider. Only use lotions that are recommended by the manufacturer. Put on clean clothes or pajamas. If it is the night before your surgery, sleep in clean sheets.  During a sponge bath Follow these steps when using CHG solution during a sponge bath (unless your health care provider gives you different instructions): Use your normal soap and shampoo to wash your face and hair. Pour the CHG onto a clean washcloth. Starting at your neck, lather your body down to your toes. Make sure you follow these instructions: If you will be having surgery, pay special attention to the part of your body where you will be having surgery. Scrub this area for at least 1 minute. Do not use CHG on your head or face. If the solution gets into your ears or eyes, rinse them well with water. Avoid your genital area. Avoid any areas of skin that have broken skin, cuts, or scrapes. Scrub your back and under your arms. Make sure to wash skin folds. Let the lather sit on your skin for 1-2 minutes or as long as told by your health care provider. Using a different clean, wet washcloth, thoroughly rinse your entire body. Make sure that all body creases and crevices  are rinsed well. Dry off with a clean towel. Do not put any substances on your body afterward--such as powder, lotion, or perfume--unless  you are told to do so by your health care provider. Only use lotions that are recommended by the manufacturer. Put on clean clothes or pajamas. If it is the night before your surgery, sleep in clean sheets. How to use CHG prepackaged cloths Only use CHG cloths as told by your health care provider, and follow the instructions on the label. Use the CHG cloth on clean, dry skin. Do not use the CHG cloth on your head or face unless your health care provider tells you to. When washing with the CHG cloth: Avoid your genital area. Avoid any areas of skin that have broken skin, cuts, or scrapes. Before surgery Follow these steps when using a CHG cloth to clean before surgery (unless your health care provider gives you different instructions): Using the CHG cloth, vigorously scrub the part of your body where you will be having surgery. Scrub using a back-and-forth motion for 3 minutes. The area on your body should be completely wet with CHG when you are done scrubbing. Do not rinse. Discard the cloth and let the area air-dry. Do not put any substances on the area afterward, such as powder, lotion, or perfume. Put on clean clothes or pajamas. If it is the night before your surgery, sleep in clean sheets.  For general bathing Follow these steps when using CHG cloths for general bathing (unless your health care provider gives you different instructions). Use a separate CHG cloth for each area of your body. Make sure you wash between any folds of skin and between your fingers and toes. Wash your body in the following order, switching to a new cloth after each step: The front of your neck, shoulders, and chest. Both of your arms, under your arms, and your hands. Your stomach and groin area, avoiding the genitals. Your right leg and foot. Your left leg and foot. The  back of your neck, your back, and your buttocks. Do not rinse. Discard the cloth and let the area air-dry. Do not put any substances on your body afterward--such as powder, lotion, or perfume--unless you are told to do so by your health care provider. Only use lotions that are recommended by the manufacturer. Put on clean clothes or pajamas. Contact a health care provider if: Your skin gets irritated after scrubbing. You have questions about using your solution or cloth. You swallow any chlorhexidine. Call your local poison control center (1-(515) 508-9169 in the [place] Get help right away if: Your eyes itch badly, or they become very red or swollen. Your skin itches badly and is red or swollen. Your hearing changes. You have trouble seeing. You have swelling or tingling in your mouth or throat. You have trouble breathing. These symptoms may represent a serious problem that is an emergency. Do not wait to see if the symptoms will go away. Get medical help right away. Call your local emergency services (911 in the [place] Do not drive yourself to the hospital. Summary Chlorhexidine gluconate (CHG) is a germ-killing (antiseptic) solution that is used to clean the skin. Cleaning your skin with CHG may help to lower your risk for infection. You may be given CHG to use for bathing. It may be in a bottle or in a prepackaged cloth to use on your skin. Carefully follow your health care provider's instructions and the instructions on the product label. Do not use CHG if you have a chlorhexidine allergy. Contact your health care provider if your skin gets irritated after scrubbing. This information is not intended  to replace advice given to you by your health care provider. Make sure you discuss any questions you have with your health care provider. Document Revised: 01/10/2021 Document Reviewed: 01/10/2021 Elsevier Patient Education  2022 [employer].

## 2021-08-23 ENCOUNTER — Telehealth: Payer: Self-pay

## 2021-08-23 DIAGNOSIS — Z992 Dependence on renal dialysis: Secondary | ICD-10-CM | POA: Diagnosis not present

## 2021-08-23 DIAGNOSIS — D689 Coagulation defect, unspecified: Secondary | ICD-10-CM | POA: Diagnosis not present

## 2021-08-23 DIAGNOSIS — N186 End stage renal disease: Secondary | ICD-10-CM | POA: Diagnosis not present

## 2021-08-23 DIAGNOSIS — R52 Pain, unspecified: Secondary | ICD-10-CM | POA: Diagnosis not present

## 2021-08-23 DIAGNOSIS — E039 Hypothyroidism, unspecified: Secondary | ICD-10-CM | POA: Diagnosis not present

## 2021-08-23 DIAGNOSIS — N2581 Secondary hyperparathyroidism of renal origin: Secondary | ICD-10-CM | POA: Diagnosis not present

## 2021-08-23 DIAGNOSIS — D631 Anemia in chronic kidney disease: Secondary | ICD-10-CM | POA: Diagnosis not present

## 2021-08-23 NOTE — Telephone Encounter (Signed)
Pt s/p left AVG revision on 08/18/21 and inquires if OK for him to shower. Advised pt after 48 hours, which has passed, he can shower- cleaning incision with soap and water, then pat dry with towel. Pt verbalized understanding.

## 2021-08-24 ENCOUNTER — Telehealth: Payer: Self-pay | Admitting: Cardiology

## 2021-08-24 ENCOUNTER — Other Ambulatory Visit: Payer: Self-pay

## 2021-08-24 ENCOUNTER — Encounter (HOSPITAL_COMMUNITY)
Admission: RE | Admit: 2021-08-24 | Discharge: 2021-08-24 | Disposition: A | Discharge status: Home / Self Care | Payer: Medicare Other | Source: Ambulatory Visit | Attending: Urology | Admitting: Urology

## 2021-08-24 MED ORDER — LOSARTAN POTASSIUM 25 MG PO TABS: 37.5000 mg | ORAL_TABLET | Freq: Every day | ORAL | 3 refills | Status: DC

## 2021-08-24 NOTE — Telephone Encounter (Signed)
New message     *STAT* If patient is at the pharmacy, call can be transferred to refill team.   1. Which medications need to be refilled? (please list name of each medication and dose if known) losartan (COZAAR) 25 MG tablet  2. Which pharmacy/location (including street and city if local pharmacy) is medication to be sent to? [facility] on [address]   3. Do they need a 30 day or 90 day supply? [place]

## 2021-08-24 NOTE — Telephone Encounter (Signed)
Complete

## 2021-08-25 DIAGNOSIS — R52 Pain, unspecified: Secondary | ICD-10-CM | POA: Diagnosis not present

## 2021-08-25 DIAGNOSIS — Z992 Dependence on renal dialysis: Secondary | ICD-10-CM | POA: Diagnosis not present

## 2021-08-25 DIAGNOSIS — D631 Anemia in chronic kidney disease: Secondary | ICD-10-CM | POA: Diagnosis not present

## 2021-08-25 DIAGNOSIS — D689 Coagulation defect, unspecified: Secondary | ICD-10-CM | POA: Diagnosis not present

## 2021-08-25 DIAGNOSIS — N186 End stage renal disease: Secondary | ICD-10-CM | POA: Diagnosis not present

## 2021-08-25 DIAGNOSIS — N2581 Secondary hyperparathyroidism of renal origin: Secondary | ICD-10-CM | POA: Diagnosis not present

## 2021-08-25 DIAGNOSIS — E039 Hypothyroidism, unspecified: Secondary | ICD-10-CM | POA: Diagnosis not present

## 2021-08-27 DIAGNOSIS — Z992 Dependence on renal dialysis: Secondary | ICD-10-CM | POA: Diagnosis not present

## 2021-08-27 DIAGNOSIS — D689 Coagulation defect, unspecified: Secondary | ICD-10-CM | POA: Diagnosis not present

## 2021-08-27 DIAGNOSIS — N186 End stage renal disease: Secondary | ICD-10-CM | POA: Diagnosis not present

## 2021-08-27 DIAGNOSIS — E039 Hypothyroidism, unspecified: Secondary | ICD-10-CM | POA: Diagnosis not present

## 2021-08-27 DIAGNOSIS — D631 Anemia in chronic kidney disease: Secondary | ICD-10-CM | POA: Diagnosis not present

## 2021-08-27 DIAGNOSIS — N2581 Secondary hyperparathyroidism of renal origin: Secondary | ICD-10-CM | POA: Diagnosis not present

## 2021-08-27 DIAGNOSIS — R52 Pain, unspecified: Secondary | ICD-10-CM | POA: Diagnosis not present

## 2021-08-29 ENCOUNTER — Encounter (HOSPITAL_COMMUNITY)
Admission: RE | Disposition: A | Discharge status: Home / Self Care | Payer: Self-pay | Source: Ambulatory Visit | Attending: Urology

## 2021-08-29 ENCOUNTER — Telehealth: Payer: Self-pay

## 2021-08-29 ENCOUNTER — Ambulatory Visit (HOSPITAL_COMMUNITY): Payer: Medicare Other | Admitting: Anesthesiology

## 2021-08-29 ENCOUNTER — Other Ambulatory Visit: Payer: Self-pay

## 2021-08-29 ENCOUNTER — Ambulatory Visit (HOSPITAL_COMMUNITY)
Admission: RE | Admit: 2021-08-29 | Discharge: 2021-08-29 | Disposition: A | Discharge status: Home / Self Care | Payer: Medicare Other | Source: Ambulatory Visit | Attending: Urology | Admitting: Urology

## 2021-08-29 ENCOUNTER — Encounter (HOSPITAL_COMMUNITY): Payer: Self-pay | Admitting: Urology

## 2021-08-29 DIAGNOSIS — N186 End stage renal disease: Secondary | ICD-10-CM | POA: Diagnosis not present

## 2021-08-29 DIAGNOSIS — Z87891 Personal history of nicotine dependence: Secondary | ICD-10-CM | POA: Diagnosis not present

## 2021-08-29 DIAGNOSIS — Z85118 Personal history of other malignant neoplasm of bronchus and lung: Secondary | ICD-10-CM | POA: Insufficient documentation

## 2021-08-29 DIAGNOSIS — I132 Hypertensive heart and chronic kidney disease with heart failure and with stage 5 chronic kidney disease, or end stage renal disease: Secondary | ICD-10-CM | POA: Diagnosis not present

## 2021-08-29 DIAGNOSIS — Z923 Personal history of irradiation: Secondary | ICD-10-CM | POA: Insufficient documentation

## 2021-08-29 DIAGNOSIS — C679 Malignant neoplasm of bladder, unspecified: Secondary | ICD-10-CM | POA: Insufficient documentation

## 2021-08-29 DIAGNOSIS — I5021 Acute systolic (congestive) heart failure: Secondary | ICD-10-CM | POA: Diagnosis not present

## 2021-08-29 DIAGNOSIS — Z955 Presence of coronary angioplasty implant and graft: Secondary | ICD-10-CM | POA: Diagnosis not present

## 2021-08-29 DIAGNOSIS — C678 Malignant neoplasm of overlapping sites of bladder: Secondary | ICD-10-CM | POA: Diagnosis not present

## 2021-08-29 DIAGNOSIS — N3090 Cystitis, unspecified without hematuria: Secondary | ICD-10-CM | POA: Insufficient documentation

## 2021-08-29 HISTORY — PX: CYSTOSCOPY: SHX5120

## 2021-08-29 HISTORY — PX: TRANSURETHRAL RESECTION OF BLADDER TUMOR: SHX2575

## 2021-08-29 LAB — BASIC METABOLIC PANEL
Anion gap: 11 (ref 5–15)
BUN: 33 mg/dL — ABNORMAL HIGH (ref 8–23)
CO2: 27 mmol/L (ref 22–32)
Calcium: 8.4 mg/dL — ABNORMAL LOW (ref 8.9–10.3)
Chloride: 95 mmol/L — ABNORMAL LOW (ref 98–111)
Creatinine, Ser: 7.53 mg/dL — ABNORMAL HIGH (ref 0.61–1.24)
GFR, Estimated: 7 mL/min — ABNORMAL LOW (ref >60)
Glucose, Bld: 85 mg/dL (ref 70–99)
Potassium: 4 mmol/L (ref 3.5–5.1)
Sodium: 133 mmol/L — ABNORMAL LOW (ref 135–145)

## 2021-08-29 LAB — POCT I-STAT, CHEM 8
BUN: 45 mg/dL — ABNORMAL HIGH (ref 8–23)
Calcium, Ion: 0.84 mmol/L — CL (ref 1.15–1.40)
Chloride: 99 mmol/L (ref 98–111)
Creatinine, Ser: 8.3 mg/dL — ABNORMAL HIGH (ref 0.61–1.24)
Glucose, Bld: 83 mg/dL (ref 70–99)
HCT: 32 % — ABNORMAL LOW (ref 39.0–52.0)
Hemoglobin: 10.9 g/dL — ABNORMAL LOW (ref 13.0–17.0)
Potassium: 5.5 mmol/L — ABNORMAL HIGH (ref 3.5–5.1)
Sodium: 132 mmol/L — ABNORMAL LOW (ref 135–145)
TCO2: 27 mmol/L (ref 22–32)

## 2021-08-29 SURGERY — TURBT (TRANSURETHRAL RESECTION OF BLADDER TUMOR)
Anesthesia: General | Site: Penis

## 2021-08-29 MED ORDER — SODIUM CHLORIDE 0.9 % IV SOLN: INTRAVENOUS | Status: DC

## 2021-08-29 MED ORDER — PROPOFOL 10 MG/ML IV BOLUS
INTRAVENOUS | Status: DC | PRN
  Administered 2021-08-29: 100 mg via INTRAVENOUS

## 2021-08-29 MED ORDER — CHLORHEXIDINE GLUCONATE 0.12 % MT SOLN
15.0000 mL | Freq: Once | OROMUCOSAL | Status: AC
  Administered 2021-08-29: 15 mL via OROMUCOSAL

## 2021-08-29 MED ORDER — DEXAMETHASONE SODIUM PHOSPHATE 10 MG/ML IJ SOLN
INTRAMUSCULAR | Status: DC | PRN
  Administered 2021-08-29: 10 mg via INTRAVENOUS

## 2021-08-29 MED ORDER — SUGAMMADEX SODIUM 500 MG/5ML IV SOLN
INTRAVENOUS | Status: DC | PRN
  Administered 2021-08-29: 200 mg via INTRAVENOUS

## 2021-08-29 MED ORDER — FENTANYL CITRATE (PF) 100 MCG/2ML IJ SOLN
INTRAMUSCULAR | Status: DC | PRN
  Administered 2021-08-29 (×2): 50 ug via INTRAVENOUS

## 2021-08-29 MED ORDER — HYDROCODONE-ACETAMINOPHEN 5-325 MG PO TABS: 1.0000 | ORAL_TABLET | Freq: Four times a day (QID) | ORAL | 0 refills | Status: DC | PRN

## 2021-08-29 MED ORDER — SODIUM CHLORIDE 0.9 % IR SOLN
Status: DC | PRN
  Administered 2021-08-29: 3000 mL

## 2021-08-29 MED ORDER — ORAL CARE MOUTH RINSE: 15.0000 mL | Freq: Once | OROMUCOSAL | Status: AC

## 2021-08-29 MED ORDER — ROCURONIUM BROMIDE 10 MG/ML (PF) SYRINGE
PREFILLED_SYRINGE | INTRAVENOUS | Status: AC
  Filled 2021-08-29: qty 10

## 2021-08-29 MED ORDER — PROPOFOL 10 MG/ML IV BOLUS
INTRAVENOUS | Status: AC
  Filled 2021-08-29: qty 40

## 2021-08-29 MED ORDER — CEFAZOLIN SODIUM-DEXTROSE 2-4 GM/100ML-% IV SOLN
2.0000 g | INTRAVENOUS | Status: AC
  Administered 2021-08-29: 2 g via INTRAVENOUS

## 2021-08-29 MED ORDER — CEFAZOLIN SODIUM-DEXTROSE 2-4 GM/100ML-% IV SOLN
INTRAVENOUS | Status: AC
  Filled 2021-08-29: qty 100

## 2021-08-29 MED ORDER — LACTATED RINGERS IV SOLN: INTRAVENOUS | Status: DC | PRN

## 2021-08-29 MED ORDER — HYDROCODONE-ACETAMINOPHEN 5-325 MG PO TABS
1.0000 | ORAL_TABLET | Freq: Once | ORAL | Status: AC
  Administered 2021-08-29: 1 via ORAL

## 2021-08-29 MED ORDER — ONDANSETRON HCL 4 MG/2ML IJ SOLN: 4.0000 mg | Freq: Once | INTRAMUSCULAR | Status: DC | PRN

## 2021-08-29 MED ORDER — HYDROCODONE-ACETAMINOPHEN 5-325 MG PO TABS
ORAL_TABLET | ORAL | Status: AC
  Filled 2021-08-29: qty 1

## 2021-08-29 MED ORDER — HYDROMORPHONE HCL 1 MG/ML IJ SOLN: 0.2500 mg | INTRAMUSCULAR | Status: DC | PRN

## 2021-08-29 MED ORDER — ROCURONIUM BROMIDE 10 MG/ML (PF) SYRINGE
PREFILLED_SYRINGE | INTRAVENOUS | Status: DC | PRN
  Administered 2021-08-29: 50 mg via INTRAVENOUS

## 2021-08-29 MED ORDER — PHENYLEPHRINE HCL (PRESSORS) 10 MG/ML IV SOLN
INTRAVENOUS | Status: DC | PRN
  Administered 2021-08-29 (×2): 80 ug via INTRAVENOUS

## 2021-08-29 MED ORDER — LIDOCAINE HCL (CARDIAC) PF 100 MG/5ML IV SOSY
PREFILLED_SYRINGE | INTRAVENOUS | Status: DC | PRN
  Administered 2021-08-29: 60 mg via INTRATRACHEAL

## 2021-08-29 MED ORDER — FENTANYL CITRATE (PF) 100 MCG/2ML IJ SOLN
INTRAMUSCULAR | Status: AC
  Filled 2021-08-29: qty 2

## 2021-08-29 MED ORDER — ONDANSETRON HCL 4 MG/2ML IJ SOLN
INTRAMUSCULAR | Status: DC | PRN
  Administered 2021-08-29: 4 mg via INTRAVENOUS

## 2021-08-29 MED ORDER — LIDOCAINE HCL (PF) 2 % IJ SOLN
INTRAMUSCULAR | Status: AC
  Filled 2021-08-29: qty 5

## 2021-08-29 MED ORDER — ONDANSETRON HCL 4 MG/2ML IJ SOLN
INTRAMUSCULAR | Status: AC
  Filled 2021-08-29: qty 2

## 2021-08-29 SURGICAL SUPPLY — 24 items
BAG DRAIN URO TABLE W/ADPT NS (BAG) ×4 IMPLANT
BAG DRN 8 ADPR NS SKTRN CSTL (BAG) ×3
BAG DRN RND TRDRP ANRFLXCHMBR (UROLOGICAL SUPPLIES) ×3
BAG HAMPER (MISCELLANEOUS) ×4 IMPLANT
BAG URINE DRAIN 2000ML AR STRL (UROLOGICAL SUPPLIES) ×4 IMPLANT
CATH FOLEY 3WAY 30CC 22F (CATHETERS) ×2 IMPLANT
CLOTH BEACON ORANGE TIMEOUT ST (SAFETY) ×4 IMPLANT
ELECT LOOP 22F BIPOLAR SML (ELECTROSURGICAL) ×4
ELECTRODE LOOP 22F BIPOLAR SML (ELECTROSURGICAL) ×3 IMPLANT
GLOVE SURG POLYISO LF SZ8 (GLOVE) ×4 IMPLANT
GLOVE SURG UNDER POLY LF SZ7 (GLOVE) ×8 IMPLANT
GOWN STRL REUS W/TWL LRG LVL3 (GOWN DISPOSABLE) ×4 IMPLANT
GOWN STRL REUS W/TWL XL LVL3 (GOWN DISPOSABLE) ×4 IMPLANT
IV NS IRRIG 3000ML ARTHROMATIC (IV SOLUTION) ×6 IMPLANT
KIT TURNOVER CYSTO (KITS) ×4 IMPLANT
MANIFOLD NEPTUNE II (INSTRUMENTS) ×4 IMPLANT
NDL HYPO 18GX1.5 BLUNT FILL (NEEDLE) ×2 IMPLANT
NEEDLE HYPO 18GX1.5 BLUNT FILL (NEEDLE) ×4 IMPLANT
PACK CYSTO (CUSTOM PROCEDURE TRAY) ×4 IMPLANT
PAD ARMBOARD 7.5X6 YLW CONV (MISCELLANEOUS) ×4 IMPLANT
PAD TELFA 3X4 1S STER (GAUZE/BANDAGES/DRESSINGS) ×4 IMPLANT
PLUG CATH AND CAP STER (CATHETERS) ×2 IMPLANT
TOWEL OR 17X26 4PK STRL BLUE (TOWEL DISPOSABLE) ×4 IMPLANT
WATER STERILE IRR 500ML POUR (IV SOLUTION) ×4 IMPLANT

## 2021-08-29 NOTE — Addendum Note (Signed)
Addendum  created 08/29/21 1210 by Denese Killings, MD   Clinical Note Signed

## 2021-08-29 NOTE — Transfer of Care (Signed)
Immediate Anesthesia Transfer of Care Note  Patient: Jeremy Johnson  Procedure(s) Performed: TRANSURETHRAL RESECTION OF BLADDER TUMOR (TURBT) (Bladder) CYSTOSCOPY (Penis)  Patient Location: PACU  Anesthesia Type:General  Level of Consciousness: awake, alert  and oriented  Airway & Oxygen Therapy: Patient Spontanous Breathing and Patient connected to nasal cannula oxygen  Post-op Assessment: Report given to RN and Post -op Vital signs reviewed and stable  Post vital signs: reviewed and stable   Last Vitals:  Vitals Value Taken Time  BP    Temp    Pulse 67   Resp    SpO2 100%     Last Pain:  Vitals:   08/29/21 0654  TempSrc: Oral  PainSc: 0-No pain         Complications: No notable events documented.

## 2021-08-29 NOTE — Anesthesia Preprocedure Evaluation (Signed)
Anesthesia Evaluation  Patient identified by MRN, date of birth, ID band Patient awake    Reviewed: Allergy & Precautions, NPO status , Patient's Chart, lab work & pertinent test results  History of Anesthesia Complications Negative for: history of anesthetic complications  Airway Mallampati: II  TM Distance: >3 FB Neck ROM: Full    Dental  (+) Dental Advisory Given, Missing Crowns :   Pulmonary shortness of breath and with exertion, COPD, former smoker,  Small cell lung cancer   Pulmonary exam normal breath sounds clear to auscultation       Cardiovascular hypertension, Pt. on medications + CAD, + Past MI, + Cardiac Stents, + Peripheral Vascular Disease (AAA repair) and +CHF (EF 25 - 30%)  + dysrhythmias Atrial Fibrillation  Rhythm:Regular Rate:Normal + Systolic murmurs [id number] (59 yr) Male Caucasian Vent. rate 86 BPM PR interval 182 ms QRS duration 124 ms QT/QTcB 373/447 ms P-R-T axes 57 -42 110 Sinus rhythm Nonspecific IVCD with LAD LVH with secondary repolarization abnormality Baseline wander in lead(s) II III aVF V3 1. Left ventricular ejection fraction, by estimation, is 25 to 30%. The left ventricle has severely decreased function. The left ventricle demonstrates global hypokinesis. The left ventricular internal cavity size  was moderately to severely dilated.  Left ventricular diastolic parameters are indeterminate.  2. Right ventricular systolic function is normal. The right ventricular size is normal. Tricuspid regurgitation signal is inadequate for assessing PA pressure.  3. Left atrial size was moderately dilated.  4. Right atrial size was mildly dilated.  5. The mitral valve is grossly normal. Trivial mitral valve  regurgitation.  6. The aortic valve is tricuspid. Aortic valve regurgitation is not visualized.  7. Aortic dilatation noted. There is borderline dilatation of the aortic root, measuring 39  mm.  8. The inferior vena cava is normal in size with greater than 50% respiratory variability, suggesting right atrial pressure of 3 mmHg.   CATH- 09/2020 1. Left dominant circulation 2. Severe single vessel obstructive CAD involving the proximal to mid LCx 3. Normal LV filling pressures 4. Mild pulmonary HTN 5. Normal cardiac output 6. Successful PCI of the proximal to mid LCx with DES x 2 overlapping. Lesion modified with cutting balloon angioplasty and Shockwave therapy.   Plan: DAPT for at least one year. Optimize medical therapy for CHF.      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 No evidence of recurrent or metastatic carcinoma within the thorax.  Stable 4.5 cm thoracic aortic arch aneurysm. Recommend semi-annual imaging followup by CT and referral to cardiothoracic surgery if not already obtained. Aortic Atherosclerosis (ICD10-I70.0) and Emphysema (ICD10-J43.9).   Electronically Signed   By: Marlaine Hind M.D.   On: 07/20/2021 11:36   Reproductive/Obstetrics                            Anesthesia Physical Anesthesia Plan  ASA: 4  Anesthesia Plan: General   Post-op Pain Management:    Induction: Intravenous  PONV Risk Score and Plan: 4 or greater and Ondansetron and Dexamethasone  Airway Management Planned: Oral ETT  Additional  Equipment:   Intra-op Plan:   Post-operative Plan: Extubation in OR  Informed Consent: I have reviewed the patients History and Physical, chart, labs and discussed the procedure including the risks, benefits and  alternatives for the proposed anesthesia with the patient or authorized representative who has indicated his/her understanding and acceptance.     Dental advisory given  Plan Discussed with: CRNA and Surgeon  Anesthesia Plan Comments:         Anesthesia Quick Evaluation

## 2021-08-29 NOTE — Interval H&P Note (Signed)
History and Physical Interval Note:  08/29/2021 7:42 AM  Jeremy Johnson  has presented today for surgery, with the diagnosis of bladder tumor.  The various methods of treatment have been discussed with the patient and family. After consideration of risks, benefits and other options for treatment, the patient has consented to  Procedure(s): TRANSURETHRAL RESECTION OF BLADDER TUMOR (TURBT) (N/A) CYSTOSCOPY as a surgical intervention.  The patient's history has been reviewed, patient examined, no change in status, stable for surgery.  I have reviewed the patient's chart and labs.  Questions were answered to the patient's satisfaction.     Nicolette Bang

## 2021-08-29 NOTE — Anesthesia Postprocedure Evaluation (Addendum)
Anesthesia Post Note  Patient: Jeremy Johnson  Procedure(s) Performed: TRANSURETHRAL RESECTION OF BLADDER TUMOR (TURBT) (Bladder) CYSTOSCOPY (Penis)  Patient location during evaluation: PACU Anesthesia Type: General Level of consciousness: awake and alert and oriented Pain management: pain level controlled Vital Signs Assessment: post-procedure vital signs reviewed and stable Respiratory status: spontaneous breathing, nonlabored ventilation and respiratory function stable Cardiovascular status: blood pressure returned to baseline and stable Postop Assessment: no apparent nausea or vomiting Anesthetic complications: no   No notable events documented.   Last Vitals:  Vitals:   08/29/21 0900 08/29/21 0915  BP: (!) 162/71 (!) 157/67  Pulse: 62 62  Resp: (!) 21 (!) 29  Temp:    SpO2: 100% 98%    Last Pain:  Vitals:   08/29/21 0930  TempSrc:   PainSc: 5                  Cartha Rotert C Jeni Duling

## 2021-08-29 NOTE — Telephone Encounter (Signed)
Pt called and regarding his post op

## 2021-08-29 NOTE — Anesthesia Procedure Notes (Signed)
Procedure Name: Intubation Date/Time: 08/29/2021 8:00 AM Performed by: Karna Dupes, CRNA Pre-anesthesia Checklist: Patient identified, Emergency Drugs available, Suction available and Patient being monitored Patient Re-evaluated:Patient Re-evaluated prior to induction Oxygen Delivery Method: Circle system utilized Preoxygenation: Pre-oxygenation with 100% oxygen Induction Type: IV induction Ventilation: Mask ventilation without difficulty Laryngoscope Size: Mac and 4 Grade View: Grade II Tube type: Oral Tube size: 7.5 mm Number of attempts: 1 Airway Equipment and Method: Stylet Placement Confirmation: ETT inserted through vocal cords under direct vision, positive ETCO2 and breath sounds checked- equal and bilateral Secured at: 21 cm Tube secured with: Tape Dental Injury: Teeth and Oropharynx as per pre-operative assessment

## 2021-08-29 NOTE — Op Note (Signed)
.  Preoperative diagnosis: bladder cancer  Postoperative diagnosis: Same  Procedure: 1 cystoscopy 2. Transurethral resection of bladder tumor, medium  Attending: Rosie Fate  Anesthesia: General  Estimated blood loss: Minimal  Drains: 22 French foley  Specimens: bladder tumor  Antibiotics: ancef  Findings: numerous 1-1.5cm papillary left lateral wall and posterior wall tumors.  Ureteral orifices in normal anatomic location.   Indications: Patient is a 75 year old male with a history of high grade bladder cancer who was found to have a recurrence on office cystoscopy.  After discussing treatment options, they decided proceed with transurethral resection of a bladder tumor.  Procedure in detail: The patient was brought to the operating room and a brief timeout was done to ensure correct patient, correct procedure, correct site.  General anesthesia was administered patient was placed in dorsal lithotomy position.  Their genitalia was then prepped and draped in usual sterile fashion.  A rigid 14 French cystoscope was passed in the urethra and the bladder.  Bladder was inspected and we noted multiple 1-1.5cm bladder tumors.  the ureteral orifices were in the normal orthotopic locations.  We then removed the cystoscope and placed a resectoscope into the bladder. Using the bipolar resectoscope we removed the bladder tumor down to the base. Hemostasis was then obtained with electrocautery. We then removed the bladder tumor chips and sent them for pathology. We then re-inspected the bladder and found no residula bleeding.  the bladder was then drained, a 22 French foley was placed and this concluded the procedure which was well tolerated by patient.  Complications: None  Condition: Stable, extubated, transferred to PACU  Plan: Patient is to be discharged home and followup in 5 days for foley catheter removal and pathology discussion.

## 2021-08-30 ENCOUNTER — Encounter (HOSPITAL_COMMUNITY): Payer: Self-pay | Admitting: Urology

## 2021-08-30 DIAGNOSIS — N2581 Secondary hyperparathyroidism of renal origin: Secondary | ICD-10-CM | POA: Diagnosis not present

## 2021-08-30 DIAGNOSIS — N186 End stage renal disease: Secondary | ICD-10-CM | POA: Diagnosis not present

## 2021-08-30 DIAGNOSIS — Z992 Dependence on renal dialysis: Secondary | ICD-10-CM | POA: Diagnosis not present

## 2021-08-30 DIAGNOSIS — R52 Pain, unspecified: Secondary | ICD-10-CM | POA: Diagnosis not present

## 2021-08-30 DIAGNOSIS — E039 Hypothyroidism, unspecified: Secondary | ICD-10-CM | POA: Diagnosis not present

## 2021-08-30 DIAGNOSIS — D631 Anemia in chronic kidney disease: Secondary | ICD-10-CM | POA: Diagnosis not present

## 2021-08-30 DIAGNOSIS — D689 Coagulation defect, unspecified: Secondary | ICD-10-CM | POA: Diagnosis not present

## 2021-08-30 LAB — SURGICAL PATHOLOGY

## 2021-08-30 NOTE — Progress Notes (Signed)
POST OPERATIVE OFFICE NOTE    CC:  F/u for surgery  HPI:  This is a 75 y.o. male who is s/p excision of LUA AVG ulcer with graft revision  on 08/18/2021 by Dr. Trula Slade.   He had presented to the ER with a scab on his HD access.  Pt states that the scab came up on the fistula last Thursday.  He states that it has not bled.  He states his graft is working well.  He did have an intervention by [facility] about a year ago.     Dialysis access history: -left BC AVF 04/25/2019 Dr. Scot Dock -LUA AVG 05/19/2019 Dr. Scot Dock   He has hx of AAA open repair by Dr. Amedeo Plenty in 2006.  He has had lung cancer and treatment as well.    Pt states he does not have pain/numbness in left hand.    The pt is on dialysis    Allergies  Allergen Reactions   Advair Hfa [Fluticasone-Salmeterol] Other (See Comments)    Developed thrush, although the mouth WAS being rinsed as directed   Penicillins Rash    Has patient had a PCN reaction causing immediate rash, facial/tongue/throat swelling, SOB or lightheadedness with hypotension: No Has patient had a PCN reaction causing severe rash involving mucus membranes or skin necrosis: No Has patient had a PCN reaction that required hospitalization: No Has patient had a PCN reaction occurring within the last 10 years: No If all of the above answers are "NO", then may proceed with Cephalosporin use.     Current Outpatient Medications  Medication Sig Dispense Refill   acetaminophen (TYLENOL) 325 MG tablet Take 2 tablets (650 mg total) by mouth every 6 (six) hours as needed for mild pain (or Fever >/= 101). (Patient taking differently: Take 650 mg by mouth every 6 (six) hours as needed for mild pain or headache (or Fever >/= 101).) 30 tablet 0   albuterol (VENTOLIN HFA) 108 (90 Base) MCG/ACT inhaler Inhale 1-2 puffs into the lungs every 6 (six) hours as needed for wheezing or shortness of breath.     allopurinol (ZYLOPRIM) 100 MG tablet Take 1 tablet (100 mg total) by mouth  daily. 30 tablet 1   amiodarone (PACERONE) 200 MG tablet Take 1 tablet (200 mg total) by mouth daily. 10 tablet 3   atorvastatin (LIPITOR) 40 MG tablet TAKE 1 TABLET BY MOUTH  DAILY 60 tablet 5   AURYXIA 1 GM 210 MG(Fe) tablet Take 210 mg by mouth daily.     B Complex-C-Zn-Folic Acid (DIALYVITE 935-[id number] 15) 0.8 MG TABS Take 1 tablet by mouth daily.     cephALEXin (KEFLEX) 500 MG capsule Take 1 capsule (500 mg total) by mouth 3 (three) times daily. (Patient not taking: No sig reported) 21 capsule 0   clopidogrel (PLAVIX) 75 MG tablet TAKE 1 TABLET BY MOUTH  DAILY WITH BREAKFAST 60 tablet 5   ELIQUIS 2.5 MG TABS tablet TAKE 1 TABLET BY MOUTH  TWICE DAILY 180 tablet 3   fexofenadine (ALLEGRA) 180 MG tablet Take 180 mg by mouth daily.     HYDROcodone-acetaminophen (NORCO) 5-325 MG tablet Take 1 tablet by mouth every 6 (six) hours as needed for moderate pain. 30 tablet 0   HYDROcodone-acetaminophen (NORCO/VICODIN) 5-325 MG tablet Take 1 tablet by mouth every 4 (four) hours as needed for moderate pain. 10 tablet 0   levothyroxine (SYNTHROID, LEVOTHROID) 175 MCG tablet Take 175 mcg by mouth daily before breakfast.      lidocaine-prilocaine (  EMLA) cream Apply 1 application topically Every Tuesday,Thursday,and Saturday with dialysis.     losartan (COZAAR) 25 MG tablet Take 1.5 tablets (37.5 mg total) by mouth daily. 135 tablet 3   metoprolol succinate (TOPROL-XL) 25 MG 24 hr tablet Take 0.5 tablets (12.5 mg total) by mouth daily. 45 tablet 3   omeprazole (PRILOSEC) 40 MG capsule Take 1 capsule (40 mg total) by mouth in the morning and at bedtime. (Patient taking differently: Take 40 mg by mouth daily.) 60 capsule 5   No current facility-administered medications for this visit.     ROS:  See HPI  Physical Exam:  Today's Vitals   09/05/21 0824  BP: (!) 168/79  Pulse: 66  Temp: 97.7 F (36.5 C)  TempSrc: Skin  SpO2: 99%  Weight: 161 lb 3.2 oz (73.1 kg)   Body mass index is 23.81  kg/m.   Incision:  healed nicely with nylon suture in place Extremities:   There is a palpable left radial pulse.   Motor and sensory are in tact.   There is a thrill/bruit present.  The fistula/graft is easily palpable    Assessment/Plan:  This is a 75 y.o. male who is s/p: excision of LUA AVG ulcer with graft revision  on 08/18/2021 by Dr. Trula Slade.  -the pt does not have evidence of steal. -nylon suture removed. -would continue to wait to stick over the incision until it is completely healed-around 4-6 weeks.   -the pt will follow up as needed.   Leontine Locket, [facility] [phone]  Clinic MD:  Trula Slade

## 2021-09-01 DIAGNOSIS — R110 Nausea: Secondary | ICD-10-CM | POA: Insufficient documentation

## 2021-09-01 DIAGNOSIS — N2581 Secondary hyperparathyroidism of renal origin: Secondary | ICD-10-CM | POA: Diagnosis not present

## 2021-09-01 DIAGNOSIS — E039 Hypothyroidism, unspecified: Secondary | ICD-10-CM | POA: Diagnosis not present

## 2021-09-01 DIAGNOSIS — D689 Coagulation defect, unspecified: Secondary | ICD-10-CM | POA: Diagnosis not present

## 2021-09-01 DIAGNOSIS — N186 End stage renal disease: Secondary | ICD-10-CM | POA: Diagnosis not present

## 2021-09-01 DIAGNOSIS — R52 Pain, unspecified: Secondary | ICD-10-CM | POA: Diagnosis not present

## 2021-09-01 DIAGNOSIS — J449 Chronic obstructive pulmonary disease, unspecified: Secondary | ICD-10-CM | POA: Diagnosis not present

## 2021-09-01 DIAGNOSIS — D631 Anemia in chronic kidney disease: Secondary | ICD-10-CM | POA: Diagnosis not present

## 2021-09-01 DIAGNOSIS — Z992 Dependence on renal dialysis: Secondary | ICD-10-CM | POA: Diagnosis not present

## 2021-09-02 ENCOUNTER — Other Ambulatory Visit: Payer: Self-pay

## 2021-09-02 ENCOUNTER — Ambulatory Visit (INDEPENDENT_AMBULATORY_CARE_PROVIDER_SITE_OTHER): Payer: Medicare Other

## 2021-09-02 DIAGNOSIS — D494 Neoplasm of unspecified behavior of bladder: Secondary | ICD-10-CM | POA: Diagnosis not present

## 2021-09-02 NOTE — Progress Notes (Signed)
Catheter Removal  Patient is present today for a catheter removal.  10ml of water was drained from the balloon. A 22FR foley cath was removed from the bladder no complications were noted . Patient tolerated well.  Performed by: Eljay Lave LPN  Follow up/ Additional notes: Keep scheduled post op

## 2021-09-03 DIAGNOSIS — N2581 Secondary hyperparathyroidism of renal origin: Secondary | ICD-10-CM | POA: Diagnosis not present

## 2021-09-03 DIAGNOSIS — R52 Pain, unspecified: Secondary | ICD-10-CM | POA: Diagnosis not present

## 2021-09-03 DIAGNOSIS — D689 Coagulation defect, unspecified: Secondary | ICD-10-CM | POA: Diagnosis not present

## 2021-09-03 DIAGNOSIS — Z992 Dependence on renal dialysis: Secondary | ICD-10-CM | POA: Diagnosis not present

## 2021-09-03 DIAGNOSIS — N186 End stage renal disease: Secondary | ICD-10-CM | POA: Diagnosis not present

## 2021-09-03 DIAGNOSIS — D631 Anemia in chronic kidney disease: Secondary | ICD-10-CM | POA: Diagnosis not present

## 2021-09-03 DIAGNOSIS — E039 Hypothyroidism, unspecified: Secondary | ICD-10-CM | POA: Diagnosis not present

## 2021-09-05 ENCOUNTER — Other Ambulatory Visit: Payer: Self-pay

## 2021-09-05 ENCOUNTER — Ambulatory Visit: Payer: Medicare Other | Admitting: Physician Assistant

## 2021-09-05 VITALS — BP 168/79 | HR 66 | Temp 97.7°F | Wt 161.2 lb

## 2021-09-05 DIAGNOSIS — N186 End stage renal disease: Secondary | ICD-10-CM

## 2021-09-05 DIAGNOSIS — Z992 Dependence on renal dialysis: Secondary | ICD-10-CM

## 2021-09-06 DIAGNOSIS — D631 Anemia in chronic kidney disease: Secondary | ICD-10-CM | POA: Diagnosis not present

## 2021-09-06 DIAGNOSIS — Z992 Dependence on renal dialysis: Secondary | ICD-10-CM | POA: Diagnosis not present

## 2021-09-06 DIAGNOSIS — N186 End stage renal disease: Secondary | ICD-10-CM | POA: Diagnosis not present

## 2021-09-06 DIAGNOSIS — E039 Hypothyroidism, unspecified: Secondary | ICD-10-CM | POA: Diagnosis not present

## 2021-09-06 DIAGNOSIS — N2581 Secondary hyperparathyroidism of renal origin: Secondary | ICD-10-CM | POA: Diagnosis not present

## 2021-09-06 DIAGNOSIS — R52 Pain, unspecified: Secondary | ICD-10-CM | POA: Diagnosis not present

## 2021-09-06 DIAGNOSIS — D689 Coagulation defect, unspecified: Secondary | ICD-10-CM | POA: Diagnosis not present

## 2021-09-07 ENCOUNTER — Ambulatory Visit (INDEPENDENT_AMBULATORY_CARE_PROVIDER_SITE_OTHER): Payer: Medicare Other | Admitting: Urology

## 2021-09-07 ENCOUNTER — Other Ambulatory Visit: Payer: Self-pay

## 2021-09-07 ENCOUNTER — Encounter: Payer: Self-pay | Admitting: Urology

## 2021-09-07 VITALS — BP 154/76 | HR 67 | Temp 97.9°F

## 2021-09-07 DIAGNOSIS — C678 Malignant neoplasm of overlapping sites of bladder: Secondary | ICD-10-CM

## 2021-09-07 NOTE — Progress Notes (Signed)
09/07/2021 10:41 AM   [name] [birth year] [id number]  Referring provider: [name], MD [address] [place],  [place] [address]  Followup bladder cancer   HPI: Mr Whitmire is a 75yo here for followup after bladder tumor resection. Pathology was T1G3 bladdr cancer.  He denies any pelvic pain. He did not tolerate BCG therapy. He has ESRD on hemodialysis. He makes very little urine daily.    PMH: Past Medical History:  Diagnosis Date   AAA (abdominal aortic aneurysm)    3.2 cm by 2020 CT; recheck in 3 years recommended   Anemia    Blood transfusion without reported diagnosis    CAD (coronary artery disease)    Stent to [place]; DES x2 to CX 2021   Chronic kidney disease    ESRD on HD   COPD (chronic obstructive pulmonary disease) (HCC)    Degenerative joint disease (DJD) of lumbar spine    GERD (gastroesophageal reflux disease)    Gout    Hyperlipidemia    Hypertension    Hypothyroidism    Incisional hernia    abdomen   Leukocytosis    CHRONIC MILD   Myocardial infarction ([place])    1997   SCL CA dx'd 01/2019   Lung cancer   Thoracic aortic aneurysm    4.5cm aortic arch by 07/20/21 CT    Surgical History: Past Surgical History:  Procedure Laterality Date   ABDOMINAL AORTIC ANEURYSM REPAIR  2006   AV FISTULA PLACEMENT Left 04/25/2019   Procedure: ARTERIOVENOUS (AV) FISTULA CREATION LEFT ARM;  Surgeon: Angelia Mould, MD;  Location: [place];  Service: Vascular;  Laterality: Left;   AV FISTULA PLACEMENT Left 05/19/2019   Procedure: CONVERSION OF LEFT ARM ARTERIOVENOUS FISTULA TO GRAFT;  Surgeon: Angelia Mould, MD;  Location: [place];  Service: Vascular;  Laterality: Left;   BIOPSY  09/30/2018   Procedure: BIOPSY;  Surgeon: Danie Binder, MD;  [facility];  Service: Endoscopy;;  ascending colon   BIOPSY  07/27/2020   Procedure: BIOPSY;  Surgeon: Eloise Harman, DO;  [facility];  Service: Endoscopy;;  gastric    BUBBLE STUDY  09/23/2020   Procedure: BUBBLE STUDY;  Surgeon: Skeet Latch, MD;  Location: [place];  Service: Cardiovascular;;   CARDIOVERSION N/A 09/23/2020   Procedure: CARDIOVERSION;  Surgeon: Skeet Latch, MD;  Location: [place];  Service: Cardiovascular;  Laterality: N/A;   COLONOSCOPY  2008   COLONOSCOPY N/A 09/30/2018   External and internal hemorrhoids, six polyps removed, one ascending colon polypoid lesion biopsied. Six simple adenomas and one benign polypoid lesion. Colonoscopy Nov 2022.    COLONOSCOPY WITH PROPOFOL N/A 07/27/2020   non-bleeding internal hemorrhoids, sigmoid and descending colon diverticulosis, four 1-2 mm polyps in ascending colon, one 5 mm polyp in transverse colon. 3 year surveillance. Tubular adenomas.    CORONARY ANGIOPLASTY WITH STENT PLACEMENT  1997   MID CIRCUMFLEX   CORONARY STENT INTERVENTION N/A 09/20/2020   Procedure: CORONARY STENT INTERVENTION;  Surgeon: Martinique, Peter M, MD;  Location: [place] [facility];  Service: Cardiovascular;  Laterality: N/A;   CYSTOSCOPY N/A 08/29/2021   Procedure: CYSTOSCOPY;  Surgeon: Cleon Gustin, MD;  [facility];  Service: Urology;  Laterality: N/A;   CYSTOSCOPY W/ RETROGRADES Bilateral 03/28/2021   Procedure: CYSTOSCOPY WITH RETROGRADE PYELOGRAM;  Surgeon: Cleon Gustin, MD;  [facility]: [facility];  Service: Urology;  Laterality: Bilateral;   ESOPHAGOGASTRODUODENOSCOPY (EGD) WITH PROPOFOL N/A 07/27/2020  Food in middle third of esophagus, gastritis s/p biopsy, nodular mucosa in lesser curvature of stomach s/p biopsy. Negative H.pylori.    HIP ARTHROPLASTY Right 04/30/2020   Procedure: ARTHROPLASTY  HIP (HEMIARTHROPLASTY);  Surgeon: Altamese [place]ade, MD;  Location: [place];  Service: Orthopedics;  Laterality: Right;   INTRAVASCULAR ULTRASOUND/IVUS N/A 09/20/2020   Procedure: Intravascular Ultrasound/IVUS;  Surgeon: Martinique, Peter M, MD;  Location: [place] [facility];  Service: Cardiovascular;   Laterality: N/A;   IR FLUORO GUIDE CV LINE RIGHT  04/22/2019   IR FLUORO GUIDE CV LINE RIGHT  05/01/2020   IR THORACENTESIS ASP PLEURAL SPACE W/IMG GUIDE  09/22/2020   IR THROMBECTOMY AV FISTULA W/THROMBOLYSIS/PTA INC/SHUNT/IMG LEFT Left 05/03/2020   IR US GUIDE VASC ACCESS LEFT  05/03/2020   IR US GUIDE VASC ACCESS RIGHT  04/22/2019   IR US GUIDE VASC ACCESS RIGHT  05/01/2020   JOINT REPLACEMENT     Right   POLYPECTOMY  09/30/2018   Procedure: POLYPECTOMY;  Surgeon: Danie Binder, MD;  [facility];  Service: Endoscopy;;  colon   POLYPECTOMY  07/27/2020   Procedure: POLYPECTOMY;  Surgeon: Eloise Harman, DO;  [facility];  Service: Endoscopy;;   REVISION OF ARTERIOVENOUS GORETEX GRAFT Left 08/18/2021   Procedure: REVISION OF LEFT ARTERIOVENOUS GORETEX GRAFT;  Surgeon: Serafina Mitchell, MD;  [facility];  Service: Vascular;  Laterality: Left;  PERIPHERAL NERVE BLOCK   RIGHT/LEFT HEART CATH AND CORONARY ANGIOGRAPHY N/A 09/20/2020   Procedure: RIGHT/LEFT HEART CATH AND CORONARY ANGIOGRAPHY;  Surgeon: Martinique, Peter M, MD;  Location: [place] [facility];  Service: Cardiovascular;  Laterality: N/A;   TEE WITHOUT CARDIOVERSION N/A 09/23/2020   Procedure: TRANSESOPHAGEAL ECHOCARDIOGRAM (TEE);  Surgeon: Skeet Latch, MD;  Location: [place];  Service: Cardiovascular;  Laterality: N/A;   TRANSURETHRAL RESECTION OF BLADDER TUMOR N/A 03/28/2021   Procedure: TRANSURETHRAL RESECTION OF BLADDER TUMOR (TURBT);  Surgeon: Cleon Gustin, MD;  [facility];  Service: Urology;  Laterality: N/A;   TRANSURETHRAL RESECTION OF BLADDER TUMOR N/A 08/29/2021   Procedure: TRANSURETHRAL RESECTION OF BLADDER TUMOR (TURBT);  Surgeon: Cleon Gustin, MD;  [facility];  Service: Urology;  Laterality: N/A;   VIDEO BRONCHOSCOPY WITH ENDOBRONCHIAL NAVIGATION N/A 01/27/2019   Procedure: VIDEO BRONCHOSCOPY WITH ENDOBRONCHIAL NAVIGATION;  Surgeon: Grace Isaac, MD;   Location: [place];  Service: Thoracic;  Laterality: N/A;   VIDEO BRONCHOSCOPY WITH ENDOBRONCHIAL ULTRASOUND N/A 01/27/2019   Procedure: VIDEO BRONCHOSCOPY WITH ENDOBRONCHIAL ULTRASOUND;  Surgeon: Grace Isaac, MD;  Location: [place];  Service: Thoracic;  Laterality: N/A;    Home Medications:  Allergies as of 09/07/2021       Reactions   Advair Hfa [fluticasone-salmeterol] Other (See Comments)   Developed thrush, although the mouth WAS being rinsed as directed   Penicillins Rash   Has patient had a PCN reaction causing immediate rash, facial/tongue/throat swelling, SOB or lightheadedness with hypotension: No Has patient had a PCN reaction causing severe rash involving mucus membranes or skin necrosis: No Has patient had a PCN reaction that required hospitalization: No Has patient had a PCN reaction occurring within the last 10 years: No If all of the above answers are "NO", then may proceed with Cephalosporin use.        Medication List        Accurate as of September 07, 2021 10:41 AM. If you have any questions, ask your nurse or doctor.  acetaminophen 325 MG tablet Commonly known as: TYLENOL Take 2 tablets (650 mg total) by mouth every 6 (six) hours as needed for mild pain (or Fever >/= 101). What changed: reasons to take this   albuterol 108 (90 Base) MCG/ACT inhaler Commonly known as: VENTOLIN HFA Inhale 1-2 puffs into the lungs every 6 (six) hours as needed for wheezing or shortness of breath.   allopurinol 100 MG tablet Commonly known as: ZYLOPRIM Take 1 tablet (100 mg total) by mouth daily.   amiodarone 200 MG tablet Commonly known as: Pacerone Take 1 tablet (200 mg total) by mouth daily.   atorvastatin 40 MG tablet Commonly known as: LIPITOR TAKE 1 TABLET BY MOUTH  DAILY   Auryxia 1 GM 210 MG(Fe) tablet Generic drug: ferric citrate Take 210 mg by mouth daily.   cephALEXin 500 MG capsule Commonly known as: KEFLEX Take 1 capsule (500 mg total) by  mouth 3 (three) times daily.   clopidogrel 75 MG tablet Commonly known as: PLAVIX TAKE 1 TABLET BY MOUTH  DAILY WITH BREAKFAST   Dialyvite 800-Zinc 15 0.8 MG Tabs Take 1 tablet by mouth daily.   Eliquis 2.5 MG Tabs tablet Generic drug: apixaban TAKE 1 TABLET BY MOUTH  TWICE DAILY   fexofenadine 180 MG tablet Commonly known as: ALLEGRA Take 180 mg by mouth daily.   HYDROcodone-acetaminophen 5-325 MG tablet Commonly known as: NORCO/VICODIN Take 1 tablet by mouth every 4 (four) hours as needed for moderate pain.   HYDROcodone-acetaminophen 5-325 MG tablet Commonly known as: Norco Take 1 tablet by mouth every 6 (six) hours as needed for moderate pain.   levothyroxine 175 MCG tablet Commonly known as: SYNTHROID Take 175 mcg by mouth daily before breakfast.   lidocaine-prilocaine cream Commonly known as: EMLA Apply 1 application topically Every Tuesday,Thursday,and Saturday with dialysis.   losartan 25 MG tablet Commonly known as: COZAAR Take 1.5 tablets (37.5 mg total) by mouth daily.   metoprolol succinate 25 MG 24 hr tablet Commonly known as: TOPROL-XL Take 0.5 tablets (12.5 mg total) by mouth daily.   omeprazole 40 MG capsule Commonly known as: PRILOSEC Take 1 capsule (40 mg total) by mouth in the morning and at bedtime. What changed: when to take this        Allergies:  Allergies  Allergen Reactions   Advair Hfa [Fluticasone-Salmeterol] Other (See Comments)    Developed thrush, although the mouth WAS being rinsed as directed   Penicillins Rash    Has patient had a PCN reaction causing immediate rash, facial/tongue/throat swelling, SOB or lightheadedness with hypotension: No Has patient had a PCN reaction causing severe rash involving mucus membranes or skin necrosis: No Has patient had a PCN reaction that required hospitalization: No Has patient had a PCN reaction occurring within the last 10 years: No If all of the above answers are "NO", then may proceed  with Cephalosporin use.     Family History: Family History  Problem Relation Age of Onset   Stroke Brother    Lung cancer Sister 68       lung cancer/former   Colon cancer Neg Hx    Colon polyps Neg Hx     Social History:  reports that he quit smoking about 3 years ago. His smoking use included cigarettes. He has a 27.00 pack-year smoking history. He has never used smokeless tobacco. He reports that he does not currently use alcohol. He reports that he does not use drugs.  ROS: All other review of systems were  reviewed and are negative except what is noted above in HPI  Physical Exam: BP (!) 154/76   Pulse 67   Temp 97.9 F (36.6 C)   Constitutional:  Alert and oriented, No acute distress. HEENT: [place] AT, moist mucus membranes.  Trachea midline, no masses. Cardiovascular: No clubbing, cyanosis, or edema. Respiratory: Normal respiratory effort, no increased work of breathing. GI: Abdomen is soft, nontender, nondistended, no abdominal masses GU: No CVA tenderness.  Lymph: No cervical or inguinal lymphadenopathy. Skin: No rashes, bruises or suspicious lesions. Neurologic: Grossly intact, no focal deficits, moving all 4 extremities. Psychiatric: Normal mood and affect.  Laboratory Data: Lab Results  Component Value Date   WBC 8.8 07/20/2021   HGB 10.9 (L) 08/29/2021   HCT 32.0 (L) 08/29/2021   MCV 96.9 07/20/2021   PLT 202 07/20/2021    Lab Results  Component Value Date   CREATININE 7.53 (H) 08/29/2021    No results found for: PSA  No results found for: TESTOSTERONE  Lab Results  Component Value Date   HGBA1C 6.5 (H) 04/25/2019    Urinalysis    Component Value Date/Time   COLORURINE BROWN (A) 05/24/2021 2345   APPEARANCEUR TURBID (A) 05/24/2021 2345   LABSPEC 1.025 05/24/2021 2345   PHURINE 7.0 05/24/2021 2345   GLUCOSEU 100 (A) 05/24/2021 2345   HGBUR LARGE (A) 05/24/2021 2345   BILIRUBINUR MODERATE (A) 05/24/2021 2345   KETONESUR NEGATIVE 05/24/2021  2345   PROTEINUR >300 (A) 05/24/2021 2345   NITRITE POSITIVE (A) 05/24/2021 2345   LEUKOCYTESUR LARGE (A) 05/24/2021 2345    Lab Results  Component Value Date   BACTERIA MANY (A) 05/24/2021    Pertinent Imaging:  No results found for this or any previous visit.  No results found for this or any previous visit.  No results found for this or any previous visit.  No results found for this or any previous visit.  Results for orders placed during the hospital encounter of 04/14/19  US RENAL  Narrative CLINICAL DATA:  Acute renal disease.  Chronic renal disease.  EXAM: RENAL / URINARY TRACT ULTRASOUND COMPLETE  COMPARISON:  PET-CT 01/22/2019.  FINDINGS: Right Kidney:  Renal measurements: 12.8 x 6.0 x 6.1 cm = volume: 242.3 mL. Increased echogenicity. 8.8 cm simple cyst. 3.6 cm simple cyst. No hydronephrosis visualized.  Left Kidney:  Renal measurements: 12.9 x 6.1 x 5.3 cm = volume: 219.5 mL. Increased echogenicity. No mass or hydronephrosis visualized.  Bladder:  Appears normal for degree of bladder distention.  IMPRESSION: Increased echogenicity both kidneys. This consistent chronic medical renal disease. Two simple cyst right kidney. No evidence of hydronephrosis or bladder distention. No acute abnormality identified.   Electronically Signed By: Marcello Moores  Register On: 04/15/2019 11:33  No results found for this or any previous visit.  No results found for this or any previous visit.  No results found for this or any previous visit.   Assessment & Plan:    1. Malignant neoplasm of overlapping sites of bladder ([place]) -We discussed intravesical therapy with gemcitabine every surveillance and the patient elects for surveillance. RTC 3 months for cystoscopy   No follow-ups on file.  Nicolette Bang, MD  [facility] [place]

## 2021-09-07 NOTE — Patient Instructions (Signed)
Bladder Cancer Bladder cancer is a condition in which abnormal tissue (a tumor) grows in the bladder. The bladder is the organ that holds urine. Two tubes (ureters) carry the urine from the kidneys to the bladder. The bladder wall is made of layers of tissue. Cancer that spreads through these layers of the bladder wall becomes more difficult to treat. What are the causes? The cause of this condition is not known. What increases the risk? The following factors may make you more likely to develop this condition: Smoking. Working where there are risks (occupational exposures), such as working with rubber, leather, clothing fabric, dyes, chemicals, and paint. Being 75 years of age or older. Being male. Having bladder inflammation that is long-term (chronic). Having a history of cancer, including: A family history of bladder cancer. Personal experience with bladder cancer. Having had certain treatments for cancer before. These include: Medicines to kill cancer cells (chemotherapy). Strong X-ray beams or capsules high in energy to kill cancer cells and shrink tumors (radiation therapy). Having been exposed to arsenic. This is a chemical element that can poison you. What are the signs or symptoms? Early symptoms of this condition include: Seeing blood in your urine. Feeling pain when urinating. Having infections of your urinary system (urinary tract infections or UTIs) that happen often. Having to urinate sooner or more often than usual. Later symptoms of this condition include: Not being able to urinate. Pain on one side of your lower back. Loss of appetite. Weight loss. Tiredness (fatigue). Swelling in your feet. Bone pain. How is this diagnosed? This condition is diagnosed based on: Your medical history. A physical exam. Lab tests, such as urine tests. Imaging tests. Your symptoms. You may also have other tests or procedures done, such as: A cystoscopy. A narrow tube is inserted  into your bladder through the organ that connects your bladder to the outside of your body (urethra). This is done to view the lining of your bladder for tumors. A biopsy. This procedure involves removing a tissue sample to look at it under a microscope to see if cancer is present. It is important to find out: How deeply into the bladder wall cancer has grown. Whether cancer has spread to any other parts of your body. This may require blood tests or imaging tests, such as a CT scan, MRI, bone scan, or X-rays. How is this treated? Your health care provider may recommend one or more types of treatment based on the stage of your cancer. The most common types of treatment are: Surgery to remove the cancer. Procedures that may be done include: Removing a tumor on the inside wall of the bladder (transurethral resection). Removing the bladder (cystectomy). Radiation therapy. This is often used together with chemotherapy. Chemotherapy. Immunotherapy. This uses medicines to help your immune system destroy cancer cells. Follow these instructions at home: Take over-the-counter and prescription medicines only as told by your health care provider. Eat a healthy diet. Some of your treatments might affect your appetite. Do not use any products that contain nicotine or tobacco, such as cigarettes, e-cigarettes, and chewing tobacco. If you need help quitting, ask your health care provider. Consider joining a support group. This may help you learn to cope with the stress of having bladder cancer. Tell your cancer care team if you develop side effects. Your team may be able to recommend ways to get relief. Keep all follow-up visits as told by your health care provider. This is important. Where to find more information American   Cancer Society: www.cancer.org National Cancer Institute (NCI): www.cancer.gov Contact a health care provider if: You have symptoms of a urinary tract infection. These  include: Fever. Chills. Weakness. Muscle aches. Pain in your abdomen. Urge to urinate that is stronger and happens more often than usual. Burning feeling in the bladder or urethra when you urinate. Get help right away if: There is blood in your urine. You cannot urinate. You have severe pain or other symptoms that do not go away. Summary Bladder cancer is a condition in which tumors grow in the bladder and cause illness. This condition is diagnosed based on your medical history, a physical exam, lab tests, imaging tests, and your symptoms. Your health care provider may recommend one or more types of treatment based on the stage of your cancer. Consider joining a support group. This may help you learn to cope with the stress of having bladder cancer. This information is not intended to replace advice given to you by your health care provider. Make sure you discuss any questions you have with your health care provider. Document Revised: 07/09/2019 Document Reviewed: 07/09/2019 Elsevier Patient Education  2022 Elsevier Inc.  

## 2021-09-07 NOTE — Progress Notes (Signed)
Urological Symptom Review  Patient is experiencing the following symptoms: Blood in urine   Review of Systems  Gastrointestinal (upper)  : Negative for upper GI symptoms  Gastrointestinal (lower) : Negative for lower GI symptoms  Constitutional : Negative for symptoms  Skin: Negative for skin symptoms  Eyes: Negative for eye symptoms  Ear/Nose/Throat : Negative for Ear/Nose/Throat symptoms  Hematologic/Lymphatic: Negative for Hematologic/Lymphatic symptoms  Cardiovascular : Negative for cardiovascular symptoms  Respiratory : Negative for respiratory symptoms  Endocrine: Negative for endocrine symptoms  Musculoskeletal: Negative for musculoskeletal symptoms  Neurological: Negative for neurological symptoms  Psychologic: Negative for psychiatric symptoms  

## 2021-09-08 DIAGNOSIS — D689 Coagulation defect, unspecified: Secondary | ICD-10-CM | POA: Diagnosis not present

## 2021-09-08 DIAGNOSIS — D631 Anemia in chronic kidney disease: Secondary | ICD-10-CM | POA: Diagnosis not present

## 2021-09-08 DIAGNOSIS — E039 Hypothyroidism, unspecified: Secondary | ICD-10-CM | POA: Diagnosis not present

## 2021-09-08 DIAGNOSIS — N186 End stage renal disease: Secondary | ICD-10-CM | POA: Diagnosis not present

## 2021-09-08 DIAGNOSIS — Z992 Dependence on renal dialysis: Secondary | ICD-10-CM | POA: Diagnosis not present

## 2021-09-08 DIAGNOSIS — R52 Pain, unspecified: Secondary | ICD-10-CM | POA: Diagnosis not present

## 2021-09-08 DIAGNOSIS — N2581 Secondary hyperparathyroidism of renal origin: Secondary | ICD-10-CM | POA: Diagnosis not present

## 2021-09-10 DIAGNOSIS — N2581 Secondary hyperparathyroidism of renal origin: Secondary | ICD-10-CM | POA: Diagnosis not present

## 2021-09-10 DIAGNOSIS — N186 End stage renal disease: Secondary | ICD-10-CM | POA: Diagnosis not present

## 2021-09-10 DIAGNOSIS — D689 Coagulation defect, unspecified: Secondary | ICD-10-CM | POA: Diagnosis not present

## 2021-09-10 DIAGNOSIS — R52 Pain, unspecified: Secondary | ICD-10-CM | POA: Diagnosis not present

## 2021-09-10 DIAGNOSIS — Z992 Dependence on renal dialysis: Secondary | ICD-10-CM | POA: Diagnosis not present

## 2021-09-10 DIAGNOSIS — E039 Hypothyroidism, unspecified: Secondary | ICD-10-CM | POA: Diagnosis not present

## 2021-09-10 DIAGNOSIS — D631 Anemia in chronic kidney disease: Secondary | ICD-10-CM | POA: Diagnosis not present

## 2021-09-12 DIAGNOSIS — N186 End stage renal disease: Secondary | ICD-10-CM | POA: Diagnosis not present

## 2021-09-12 DIAGNOSIS — I129 Hypertensive chronic kidney disease with stage 1 through stage 4 chronic kidney disease, or unspecified chronic kidney disease: Secondary | ICD-10-CM | POA: Diagnosis not present

## 2021-09-12 DIAGNOSIS — Z992 Dependence on renal dialysis: Secondary | ICD-10-CM | POA: Diagnosis not present

## 2021-09-13 DIAGNOSIS — D631 Anemia in chronic kidney disease: Secondary | ICD-10-CM | POA: Diagnosis not present

## 2021-09-13 DIAGNOSIS — N186 End stage renal disease: Secondary | ICD-10-CM | POA: Diagnosis not present

## 2021-09-13 DIAGNOSIS — N2581 Secondary hyperparathyroidism of renal origin: Secondary | ICD-10-CM | POA: Diagnosis not present

## 2021-09-13 DIAGNOSIS — R52 Pain, unspecified: Secondary | ICD-10-CM | POA: Diagnosis not present

## 2021-09-13 DIAGNOSIS — D689 Coagulation defect, unspecified: Secondary | ICD-10-CM | POA: Diagnosis not present

## 2021-09-13 DIAGNOSIS — Z992 Dependence on renal dialysis: Secondary | ICD-10-CM | POA: Diagnosis not present

## 2021-09-15 DIAGNOSIS — R52 Pain, unspecified: Secondary | ICD-10-CM | POA: Diagnosis not present

## 2021-09-15 DIAGNOSIS — Z992 Dependence on renal dialysis: Secondary | ICD-10-CM | POA: Diagnosis not present

## 2021-09-15 DIAGNOSIS — D689 Coagulation defect, unspecified: Secondary | ICD-10-CM | POA: Diagnosis not present

## 2021-09-15 DIAGNOSIS — N186 End stage renal disease: Secondary | ICD-10-CM | POA: Diagnosis not present

## 2021-09-15 DIAGNOSIS — N2581 Secondary hyperparathyroidism of renal origin: Secondary | ICD-10-CM | POA: Diagnosis not present

## 2021-09-15 DIAGNOSIS — D631 Anemia in chronic kidney disease: Secondary | ICD-10-CM | POA: Diagnosis not present

## 2021-09-17 DIAGNOSIS — N2581 Secondary hyperparathyroidism of renal origin: Secondary | ICD-10-CM | POA: Diagnosis not present

## 2021-09-17 DIAGNOSIS — D689 Coagulation defect, unspecified: Secondary | ICD-10-CM | POA: Diagnosis not present

## 2021-09-17 DIAGNOSIS — D631 Anemia in chronic kidney disease: Secondary | ICD-10-CM | POA: Diagnosis not present

## 2021-09-17 DIAGNOSIS — N186 End stage renal disease: Secondary | ICD-10-CM | POA: Diagnosis not present

## 2021-09-17 DIAGNOSIS — R52 Pain, unspecified: Secondary | ICD-10-CM | POA: Diagnosis not present

## 2021-09-17 DIAGNOSIS — Z992 Dependence on renal dialysis: Secondary | ICD-10-CM | POA: Diagnosis not present

## 2021-09-20 DIAGNOSIS — R52 Pain, unspecified: Secondary | ICD-10-CM | POA: Diagnosis not present

## 2021-09-20 DIAGNOSIS — N186 End stage renal disease: Secondary | ICD-10-CM | POA: Diagnosis not present

## 2021-09-20 DIAGNOSIS — N2581 Secondary hyperparathyroidism of renal origin: Secondary | ICD-10-CM | POA: Diagnosis not present

## 2021-09-20 DIAGNOSIS — D689 Coagulation defect, unspecified: Secondary | ICD-10-CM | POA: Diagnosis not present

## 2021-09-20 DIAGNOSIS — D631 Anemia in chronic kidney disease: Secondary | ICD-10-CM | POA: Diagnosis not present

## 2021-09-20 DIAGNOSIS — Z992 Dependence on renal dialysis: Secondary | ICD-10-CM | POA: Diagnosis not present

## 2021-09-22 DIAGNOSIS — Z992 Dependence on renal dialysis: Secondary | ICD-10-CM | POA: Diagnosis not present

## 2021-09-22 DIAGNOSIS — D689 Coagulation defect, unspecified: Secondary | ICD-10-CM | POA: Diagnosis not present

## 2021-09-22 DIAGNOSIS — N2581 Secondary hyperparathyroidism of renal origin: Secondary | ICD-10-CM | POA: Diagnosis not present

## 2021-09-22 DIAGNOSIS — D631 Anemia in chronic kidney disease: Secondary | ICD-10-CM | POA: Diagnosis not present

## 2021-09-22 DIAGNOSIS — N186 End stage renal disease: Secondary | ICD-10-CM | POA: Diagnosis not present

## 2021-09-22 DIAGNOSIS — R52 Pain, unspecified: Secondary | ICD-10-CM | POA: Diagnosis not present

## 2021-09-24 DIAGNOSIS — N186 End stage renal disease: Secondary | ICD-10-CM | POA: Diagnosis not present

## 2021-09-24 DIAGNOSIS — R52 Pain, unspecified: Secondary | ICD-10-CM | POA: Diagnosis not present

## 2021-09-24 DIAGNOSIS — D631 Anemia in chronic kidney disease: Secondary | ICD-10-CM | POA: Diagnosis not present

## 2021-09-24 DIAGNOSIS — N2581 Secondary hyperparathyroidism of renal origin: Secondary | ICD-10-CM | POA: Diagnosis not present

## 2021-09-24 DIAGNOSIS — D689 Coagulation defect, unspecified: Secondary | ICD-10-CM | POA: Diagnosis not present

## 2021-09-24 DIAGNOSIS — Z992 Dependence on renal dialysis: Secondary | ICD-10-CM | POA: Diagnosis not present

## 2021-09-27 DIAGNOSIS — N186 End stage renal disease: Secondary | ICD-10-CM | POA: Diagnosis not present

## 2021-09-27 DIAGNOSIS — Z992 Dependence on renal dialysis: Secondary | ICD-10-CM | POA: Diagnosis not present

## 2021-09-27 DIAGNOSIS — D689 Coagulation defect, unspecified: Secondary | ICD-10-CM | POA: Diagnosis not present

## 2021-09-27 DIAGNOSIS — R52 Pain, unspecified: Secondary | ICD-10-CM | POA: Diagnosis not present

## 2021-09-27 DIAGNOSIS — D631 Anemia in chronic kidney disease: Secondary | ICD-10-CM | POA: Diagnosis not present

## 2021-09-27 DIAGNOSIS — N2581 Secondary hyperparathyroidism of renal origin: Secondary | ICD-10-CM | POA: Diagnosis not present

## 2021-09-29 DIAGNOSIS — N186 End stage renal disease: Secondary | ICD-10-CM | POA: Diagnosis not present

## 2021-09-29 DIAGNOSIS — D631 Anemia in chronic kidney disease: Secondary | ICD-10-CM | POA: Diagnosis not present

## 2021-09-29 DIAGNOSIS — D689 Coagulation defect, unspecified: Secondary | ICD-10-CM | POA: Diagnosis not present

## 2021-09-29 DIAGNOSIS — N2581 Secondary hyperparathyroidism of renal origin: Secondary | ICD-10-CM | POA: Diagnosis not present

## 2021-09-29 DIAGNOSIS — Z992 Dependence on renal dialysis: Secondary | ICD-10-CM | POA: Diagnosis not present

## 2021-09-29 DIAGNOSIS — R52 Pain, unspecified: Secondary | ICD-10-CM | POA: Diagnosis not present

## 2021-10-01 DIAGNOSIS — N2581 Secondary hyperparathyroidism of renal origin: Secondary | ICD-10-CM | POA: Diagnosis not present

## 2021-10-01 DIAGNOSIS — D689 Coagulation defect, unspecified: Secondary | ICD-10-CM | POA: Diagnosis not present

## 2021-10-01 DIAGNOSIS — Z992 Dependence on renal dialysis: Secondary | ICD-10-CM | POA: Diagnosis not present

## 2021-10-01 DIAGNOSIS — D631 Anemia in chronic kidney disease: Secondary | ICD-10-CM | POA: Diagnosis not present

## 2021-10-01 DIAGNOSIS — R52 Pain, unspecified: Secondary | ICD-10-CM | POA: Diagnosis not present

## 2021-10-01 DIAGNOSIS — N186 End stage renal disease: Secondary | ICD-10-CM | POA: Diagnosis not present

## 2021-10-02 DIAGNOSIS — J449 Chronic obstructive pulmonary disease, unspecified: Secondary | ICD-10-CM | POA: Diagnosis not present

## 2021-10-04 DIAGNOSIS — N186 End stage renal disease: Secondary | ICD-10-CM | POA: Diagnosis not present

## 2021-10-04 DIAGNOSIS — I10 Essential (primary) hypertension: Secondary | ICD-10-CM | POA: Diagnosis not present

## 2021-10-04 DIAGNOSIS — I5022 Chronic systolic (congestive) heart failure: Secondary | ICD-10-CM | POA: Diagnosis not present

## 2021-10-04 DIAGNOSIS — D631 Anemia in chronic kidney disease: Secondary | ICD-10-CM | POA: Diagnosis not present

## 2021-10-04 DIAGNOSIS — N2581 Secondary hyperparathyroidism of renal origin: Secondary | ICD-10-CM | POA: Diagnosis not present

## 2021-10-04 DIAGNOSIS — R52 Pain, unspecified: Secondary | ICD-10-CM | POA: Diagnosis not present

## 2021-10-04 DIAGNOSIS — Z992 Dependence on renal dialysis: Secondary | ICD-10-CM | POA: Diagnosis not present

## 2021-10-04 DIAGNOSIS — D689 Coagulation defect, unspecified: Secondary | ICD-10-CM | POA: Diagnosis not present

## 2021-10-07 DIAGNOSIS — N2581 Secondary hyperparathyroidism of renal origin: Secondary | ICD-10-CM | POA: Diagnosis not present

## 2021-10-07 DIAGNOSIS — Z992 Dependence on renal dialysis: Secondary | ICD-10-CM | POA: Diagnosis not present

## 2021-10-07 DIAGNOSIS — R52 Pain, unspecified: Secondary | ICD-10-CM | POA: Diagnosis not present

## 2021-10-07 DIAGNOSIS — D689 Coagulation defect, unspecified: Secondary | ICD-10-CM | POA: Diagnosis not present

## 2021-10-07 DIAGNOSIS — N186 End stage renal disease: Secondary | ICD-10-CM | POA: Diagnosis not present

## 2021-10-07 DIAGNOSIS — D631 Anemia in chronic kidney disease: Secondary | ICD-10-CM | POA: Diagnosis not present

## 2021-10-08 DIAGNOSIS — D689 Coagulation defect, unspecified: Secondary | ICD-10-CM | POA: Diagnosis not present

## 2021-10-08 DIAGNOSIS — Z992 Dependence on renal dialysis: Secondary | ICD-10-CM | POA: Diagnosis not present

## 2021-10-08 DIAGNOSIS — R52 Pain, unspecified: Secondary | ICD-10-CM | POA: Diagnosis not present

## 2021-10-08 DIAGNOSIS — N186 End stage renal disease: Secondary | ICD-10-CM | POA: Diagnosis not present

## 2021-10-08 DIAGNOSIS — D631 Anemia in chronic kidney disease: Secondary | ICD-10-CM | POA: Diagnosis not present

## 2021-10-08 DIAGNOSIS — N2581 Secondary hyperparathyroidism of renal origin: Secondary | ICD-10-CM | POA: Diagnosis not present

## 2021-10-11 DIAGNOSIS — D689 Coagulation defect, unspecified: Secondary | ICD-10-CM | POA: Diagnosis not present

## 2021-10-11 DIAGNOSIS — N186 End stage renal disease: Secondary | ICD-10-CM | POA: Diagnosis not present

## 2021-10-11 DIAGNOSIS — D631 Anemia in chronic kidney disease: Secondary | ICD-10-CM | POA: Diagnosis not present

## 2021-10-11 DIAGNOSIS — Z992 Dependence on renal dialysis: Secondary | ICD-10-CM | POA: Diagnosis not present

## 2021-10-11 DIAGNOSIS — N2581 Secondary hyperparathyroidism of renal origin: Secondary | ICD-10-CM | POA: Diagnosis not present

## 2021-10-11 DIAGNOSIS — R52 Pain, unspecified: Secondary | ICD-10-CM | POA: Diagnosis not present

## 2021-10-12 DIAGNOSIS — N186 End stage renal disease: Secondary | ICD-10-CM | POA: Diagnosis not present

## 2021-10-12 DIAGNOSIS — Z992 Dependence on renal dialysis: Secondary | ICD-10-CM | POA: Diagnosis not present

## 2021-10-12 DIAGNOSIS — I129 Hypertensive chronic kidney disease with stage 1 through stage 4 chronic kidney disease, or unspecified chronic kidney disease: Secondary | ICD-10-CM | POA: Diagnosis not present

## 2021-10-13 DIAGNOSIS — N2581 Secondary hyperparathyroidism of renal origin: Secondary | ICD-10-CM | POA: Diagnosis not present

## 2021-10-13 DIAGNOSIS — Z992 Dependence on renal dialysis: Secondary | ICD-10-CM | POA: Diagnosis not present

## 2021-10-13 DIAGNOSIS — N186 End stage renal disease: Secondary | ICD-10-CM | POA: Diagnosis not present

## 2021-10-13 DIAGNOSIS — D689 Coagulation defect, unspecified: Secondary | ICD-10-CM | POA: Diagnosis not present

## 2021-10-13 DIAGNOSIS — D631 Anemia in chronic kidney disease: Secondary | ICD-10-CM | POA: Diagnosis not present

## 2021-10-15 DIAGNOSIS — N186 End stage renal disease: Secondary | ICD-10-CM | POA: Diagnosis not present

## 2021-10-15 DIAGNOSIS — D689 Coagulation defect, unspecified: Secondary | ICD-10-CM | POA: Diagnosis not present

## 2021-10-15 DIAGNOSIS — Z992 Dependence on renal dialysis: Secondary | ICD-10-CM | POA: Diagnosis not present

## 2021-10-15 DIAGNOSIS — D631 Anemia in chronic kidney disease: Secondary | ICD-10-CM | POA: Diagnosis not present

## 2021-10-15 DIAGNOSIS — N2581 Secondary hyperparathyroidism of renal origin: Secondary | ICD-10-CM | POA: Diagnosis not present

## 2021-10-18 DIAGNOSIS — D689 Coagulation defect, unspecified: Secondary | ICD-10-CM | POA: Diagnosis not present

## 2021-10-18 DIAGNOSIS — N186 End stage renal disease: Secondary | ICD-10-CM | POA: Diagnosis not present

## 2021-10-18 DIAGNOSIS — N2581 Secondary hyperparathyroidism of renal origin: Secondary | ICD-10-CM | POA: Diagnosis not present

## 2021-10-18 DIAGNOSIS — Z992 Dependence on renal dialysis: Secondary | ICD-10-CM | POA: Diagnosis not present

## 2021-10-18 DIAGNOSIS — D631 Anemia in chronic kidney disease: Secondary | ICD-10-CM | POA: Diagnosis not present

## 2021-10-20 DIAGNOSIS — N186 End stage renal disease: Secondary | ICD-10-CM | POA: Diagnosis not present

## 2021-10-20 DIAGNOSIS — N2581 Secondary hyperparathyroidism of renal origin: Secondary | ICD-10-CM | POA: Diagnosis not present

## 2021-10-20 DIAGNOSIS — D689 Coagulation defect, unspecified: Secondary | ICD-10-CM | POA: Diagnosis not present

## 2021-10-20 DIAGNOSIS — D631 Anemia in chronic kidney disease: Secondary | ICD-10-CM | POA: Diagnosis not present

## 2021-10-20 DIAGNOSIS — Z992 Dependence on renal dialysis: Secondary | ICD-10-CM | POA: Diagnosis not present

## 2021-10-22 DIAGNOSIS — D631 Anemia in chronic kidney disease: Secondary | ICD-10-CM | POA: Diagnosis not present

## 2021-10-22 DIAGNOSIS — D689 Coagulation defect, unspecified: Secondary | ICD-10-CM | POA: Diagnosis not present

## 2021-10-22 DIAGNOSIS — Z992 Dependence on renal dialysis: Secondary | ICD-10-CM | POA: Diagnosis not present

## 2021-10-22 DIAGNOSIS — N186 End stage renal disease: Secondary | ICD-10-CM | POA: Diagnosis not present

## 2021-10-22 DIAGNOSIS — N2581 Secondary hyperparathyroidism of renal origin: Secondary | ICD-10-CM | POA: Diagnosis not present

## 2021-10-25 DIAGNOSIS — D631 Anemia in chronic kidney disease: Secondary | ICD-10-CM | POA: Diagnosis not present

## 2021-10-25 DIAGNOSIS — N186 End stage renal disease: Secondary | ICD-10-CM | POA: Diagnosis not present

## 2021-10-25 DIAGNOSIS — N2581 Secondary hyperparathyroidism of renal origin: Secondary | ICD-10-CM | POA: Diagnosis not present

## 2021-10-25 DIAGNOSIS — Z992 Dependence on renal dialysis: Secondary | ICD-10-CM | POA: Diagnosis not present

## 2021-10-25 DIAGNOSIS — D689 Coagulation defect, unspecified: Secondary | ICD-10-CM | POA: Diagnosis not present

## 2021-10-26 ENCOUNTER — Telehealth: Payer: Self-pay | Admitting: Cardiology

## 2021-10-26 NOTE — Telephone Encounter (Signed)
Will need to clarify details of the procedure and MD office performing procedure should send over clearance form if they need pt to stop his anticoagulation. Typically for most dermatologic procedures, pts can continue on their anticoagulation though.

## 2021-10-26 NOTE — Telephone Encounter (Signed)
Call placed to pt.  Pt hasn't seen the Dermatologist yet and was trying to get ahead.  Pt was explained that if the Doctor wanted anything to be held, they would send [place] a Surgical Clearance over for [place] to address.  Pt was grateful for the call back.

## 2021-10-26 NOTE — Telephone Encounter (Signed)
Patient has an appt with the dermatologist next week.  He is having something removed from his neck he wants to know if needs to stop taking his blood thinners.

## 2021-10-27 DIAGNOSIS — D631 Anemia in chronic kidney disease: Secondary | ICD-10-CM | POA: Diagnosis not present

## 2021-10-27 DIAGNOSIS — N186 End stage renal disease: Secondary | ICD-10-CM | POA: Diagnosis not present

## 2021-10-27 DIAGNOSIS — N2581 Secondary hyperparathyroidism of renal origin: Secondary | ICD-10-CM | POA: Diagnosis not present

## 2021-10-27 DIAGNOSIS — Z992 Dependence on renal dialysis: Secondary | ICD-10-CM | POA: Diagnosis not present

## 2021-10-27 DIAGNOSIS — D689 Coagulation defect, unspecified: Secondary | ICD-10-CM | POA: Diagnosis not present

## 2021-10-29 DIAGNOSIS — N186 End stage renal disease: Secondary | ICD-10-CM | POA: Diagnosis not present

## 2021-10-29 DIAGNOSIS — D689 Coagulation defect, unspecified: Secondary | ICD-10-CM | POA: Diagnosis not present

## 2021-10-29 DIAGNOSIS — D631 Anemia in chronic kidney disease: Secondary | ICD-10-CM | POA: Diagnosis not present

## 2021-10-29 DIAGNOSIS — Z992 Dependence on renal dialysis: Secondary | ICD-10-CM | POA: Diagnosis not present

## 2021-10-29 DIAGNOSIS — N2581 Secondary hyperparathyroidism of renal origin: Secondary | ICD-10-CM | POA: Diagnosis not present

## 2021-11-01 DIAGNOSIS — N2581 Secondary hyperparathyroidism of renal origin: Secondary | ICD-10-CM | POA: Diagnosis not present

## 2021-11-01 DIAGNOSIS — Z992 Dependence on renal dialysis: Secondary | ICD-10-CM | POA: Diagnosis not present

## 2021-11-01 DIAGNOSIS — D631 Anemia in chronic kidney disease: Secondary | ICD-10-CM | POA: Diagnosis not present

## 2021-11-01 DIAGNOSIS — D689 Coagulation defect, unspecified: Secondary | ICD-10-CM | POA: Diagnosis not present

## 2021-11-01 DIAGNOSIS — J449 Chronic obstructive pulmonary disease, unspecified: Secondary | ICD-10-CM | POA: Diagnosis not present

## 2021-11-01 DIAGNOSIS — N186 End stage renal disease: Secondary | ICD-10-CM | POA: Diagnosis not present

## 2021-11-03 DIAGNOSIS — D631 Anemia in chronic kidney disease: Secondary | ICD-10-CM | POA: Diagnosis not present

## 2021-11-03 DIAGNOSIS — Z992 Dependence on renal dialysis: Secondary | ICD-10-CM | POA: Diagnosis not present

## 2021-11-03 DIAGNOSIS — D689 Coagulation defect, unspecified: Secondary | ICD-10-CM | POA: Diagnosis not present

## 2021-11-03 DIAGNOSIS — N2581 Secondary hyperparathyroidism of renal origin: Secondary | ICD-10-CM | POA: Diagnosis not present

## 2021-11-03 DIAGNOSIS — N186 End stage renal disease: Secondary | ICD-10-CM | POA: Diagnosis not present

## 2021-11-05 DIAGNOSIS — Z992 Dependence on renal dialysis: Secondary | ICD-10-CM | POA: Diagnosis not present

## 2021-11-05 DIAGNOSIS — N186 End stage renal disease: Secondary | ICD-10-CM | POA: Diagnosis not present

## 2021-11-05 DIAGNOSIS — D689 Coagulation defect, unspecified: Secondary | ICD-10-CM | POA: Diagnosis not present

## 2021-11-05 DIAGNOSIS — D631 Anemia in chronic kidney disease: Secondary | ICD-10-CM | POA: Diagnosis not present

## 2021-11-05 DIAGNOSIS — N2581 Secondary hyperparathyroidism of renal origin: Secondary | ICD-10-CM | POA: Diagnosis not present

## 2021-11-06 ENCOUNTER — Other Ambulatory Visit: Payer: Self-pay | Admitting: Cardiology

## 2021-11-08 DIAGNOSIS — D689 Coagulation defect, unspecified: Secondary | ICD-10-CM | POA: Diagnosis not present

## 2021-11-08 DIAGNOSIS — N2581 Secondary hyperparathyroidism of renal origin: Secondary | ICD-10-CM | POA: Diagnosis not present

## 2021-11-08 DIAGNOSIS — Z992 Dependence on renal dialysis: Secondary | ICD-10-CM | POA: Diagnosis not present

## 2021-11-08 DIAGNOSIS — D631 Anemia in chronic kidney disease: Secondary | ICD-10-CM | POA: Diagnosis not present

## 2021-11-08 DIAGNOSIS — N186 End stage renal disease: Secondary | ICD-10-CM | POA: Diagnosis not present

## 2021-11-10 DIAGNOSIS — D631 Anemia in chronic kidney disease: Secondary | ICD-10-CM | POA: Diagnosis not present

## 2021-11-10 DIAGNOSIS — N186 End stage renal disease: Secondary | ICD-10-CM | POA: Diagnosis not present

## 2021-11-10 DIAGNOSIS — Z992 Dependence on renal dialysis: Secondary | ICD-10-CM | POA: Diagnosis not present

## 2021-11-10 DIAGNOSIS — D689 Coagulation defect, unspecified: Secondary | ICD-10-CM | POA: Diagnosis not present

## 2021-11-10 DIAGNOSIS — N2581 Secondary hyperparathyroidism of renal origin: Secondary | ICD-10-CM | POA: Diagnosis not present

## 2021-11-12 DIAGNOSIS — N186 End stage renal disease: Secondary | ICD-10-CM | POA: Diagnosis not present

## 2021-11-12 DIAGNOSIS — N2581 Secondary hyperparathyroidism of renal origin: Secondary | ICD-10-CM | POA: Diagnosis not present

## 2021-11-12 DIAGNOSIS — Z992 Dependence on renal dialysis: Secondary | ICD-10-CM | POA: Diagnosis not present

## 2021-11-12 DIAGNOSIS — D631 Anemia in chronic kidney disease: Secondary | ICD-10-CM | POA: Diagnosis not present

## 2021-11-12 DIAGNOSIS — D689 Coagulation defect, unspecified: Secondary | ICD-10-CM | POA: Diagnosis not present

## 2021-11-12 DIAGNOSIS — I129 Hypertensive chronic kidney disease with stage 1 through stage 4 chronic kidney disease, or unspecified chronic kidney disease: Secondary | ICD-10-CM | POA: Diagnosis not present

## 2021-11-15 DIAGNOSIS — T82398A Other mechanical complication of other vascular grafts, initial encounter: Secondary | ICD-10-CM | POA: Diagnosis not present

## 2021-11-15 DIAGNOSIS — N2581 Secondary hyperparathyroidism of renal origin: Secondary | ICD-10-CM | POA: Diagnosis not present

## 2021-11-15 DIAGNOSIS — E039 Hypothyroidism, unspecified: Secondary | ICD-10-CM | POA: Diagnosis not present

## 2021-11-15 DIAGNOSIS — Z992 Dependence on renal dialysis: Secondary | ICD-10-CM | POA: Diagnosis not present

## 2021-11-15 DIAGNOSIS — L0889 Other specified local infections of the skin and subcutaneous tissue: Secondary | ICD-10-CM | POA: Diagnosis not present

## 2021-11-15 DIAGNOSIS — L089 Local infection of the skin and subcutaneous tissue, unspecified: Secondary | ICD-10-CM | POA: Diagnosis not present

## 2021-11-15 DIAGNOSIS — D631 Anemia in chronic kidney disease: Secondary | ICD-10-CM | POA: Diagnosis not present

## 2021-11-15 DIAGNOSIS — D689 Coagulation defect, unspecified: Secondary | ICD-10-CM | POA: Diagnosis not present

## 2021-11-15 DIAGNOSIS — N186 End stage renal disease: Secondary | ICD-10-CM | POA: Diagnosis not present

## 2021-11-16 ENCOUNTER — Ambulatory Visit: Payer: Medicare Other | Admitting: Cardiology

## 2021-11-16 DIAGNOSIS — C44329 Squamous cell carcinoma of skin of other parts of face: Secondary | ICD-10-CM | POA: Diagnosis not present

## 2021-11-16 DIAGNOSIS — C4441 Basal cell carcinoma of skin of scalp and neck: Secondary | ICD-10-CM | POA: Diagnosis not present

## 2021-11-16 DIAGNOSIS — C44321 Squamous cell carcinoma of skin of nose: Secondary | ICD-10-CM | POA: Diagnosis not present

## 2021-11-17 ENCOUNTER — Other Ambulatory Visit: Payer: Self-pay

## 2021-11-17 ENCOUNTER — Encounter (HOSPITAL_COMMUNITY): Payer: Self-pay

## 2021-11-17 ENCOUNTER — Emergency Department (HOSPITAL_COMMUNITY)
Admission: EM | Admit: 2021-11-17 | Discharge: 2021-11-17 | Disposition: A | Discharge status: Home / Self Care | Payer: Medicare Other | Attending: Emergency Medicine | Admitting: Emergency Medicine

## 2021-11-17 DIAGNOSIS — T82398A Other mechanical complication of other vascular grafts, initial encounter: Secondary | ICD-10-CM | POA: Diagnosis not present

## 2021-11-17 DIAGNOSIS — D699 Hemorrhagic condition, unspecified: Secondary | ICD-10-CM | POA: Diagnosis not present

## 2021-11-17 DIAGNOSIS — Z992 Dependence on renal dialysis: Secondary | ICD-10-CM | POA: Diagnosis not present

## 2021-11-17 DIAGNOSIS — L988 Other specified disorders of the skin and subcutaneous tissue: Secondary | ICD-10-CM | POA: Insufficient documentation

## 2021-11-17 DIAGNOSIS — D631 Anemia in chronic kidney disease: Secondary | ICD-10-CM | POA: Diagnosis not present

## 2021-11-17 DIAGNOSIS — N2581 Secondary hyperparathyroidism of renal origin: Secondary | ICD-10-CM | POA: Diagnosis not present

## 2021-11-17 DIAGNOSIS — Z7902 Long term (current) use of antithrombotics/antiplatelets: Secondary | ICD-10-CM | POA: Insufficient documentation

## 2021-11-17 DIAGNOSIS — N186 End stage renal disease: Secondary | ICD-10-CM | POA: Diagnosis not present

## 2021-11-17 DIAGNOSIS — L989 Disorder of the skin and subcutaneous tissue, unspecified: Secondary | ICD-10-CM

## 2021-11-17 DIAGNOSIS — D689 Coagulation defect, unspecified: Secondary | ICD-10-CM | POA: Diagnosis not present

## 2021-11-17 DIAGNOSIS — R221 Localized swelling, mass and lump, neck: Secondary | ICD-10-CM | POA: Diagnosis not present

## 2021-11-17 DIAGNOSIS — L089 Local infection of the skin and subcutaneous tissue, unspecified: Secondary | ICD-10-CM | POA: Diagnosis not present

## 2021-11-17 DIAGNOSIS — Z79899 Other long term (current) drug therapy: Secondary | ICD-10-CM | POA: Diagnosis not present

## 2021-11-17 DIAGNOSIS — Z7901 Long term (current) use of anticoagulants: Secondary | ICD-10-CM | POA: Insufficient documentation

## 2021-11-17 DIAGNOSIS — E039 Hypothyroidism, unspecified: Secondary | ICD-10-CM | POA: Diagnosis not present

## 2021-11-17 DIAGNOSIS — L7622 Postprocedural hemorrhage and hematoma of skin and subcutaneous tissue following other procedure: Secondary | ICD-10-CM | POA: Insufficient documentation

## 2021-11-17 DIAGNOSIS — R58 Hemorrhage, not elsewhere classified: Secondary | ICD-10-CM

## 2021-11-17 DIAGNOSIS — L0889 Other specified local infections of the skin and subcutaneous tissue: Secondary | ICD-10-CM | POA: Diagnosis not present

## 2021-11-17 MED ORDER — SILVER NITRATE-POT NITRATE 75-25 % EX MISC
1.0000 | Freq: Once | CUTANEOUS | Status: AC
  Administered 2021-11-17: 1 via TOPICAL
  Filled 2021-11-17: qty 10

## 2021-11-17 MED ORDER — LIDOCAINE-EPINEPHRINE (PF) 2 %-1:200000 IJ SOLN
20.0000 mL | Freq: Once | INTRAMUSCULAR | Status: AC
  Administered 2021-11-17: 20 mL
  Filled 2021-11-17: qty 20

## 2021-11-17 MED ORDER — HYDROCODONE-ACETAMINOPHEN 5-325 MG PO TABS
1.0000 | ORAL_TABLET | Freq: Once | ORAL | Status: AC
  Administered 2021-11-17: 1 via ORAL
  Filled 2021-11-17: qty 1

## 2021-11-17 NOTE — ED Provider Notes (Signed)
[facility] EMERGENCY DEPARTMENT Provider Note   CSN: [id number] Arrival date & time: 11/17/21  0865     History  Chief Complaint  Patient presents with   Post-op Problem    Jeremy Johnson is a 76 y.o. male.  He had multiple skin lesions removed yesterday.  He woke up around 2 AM with bleeding from 1 of those lesions on the side of his neck.  Complaining of pain at the area.  He is on blood thinners and is a dialysis patient.  Denies any other bleeding.  No chest pain or shortness of breath.  The history is provided by the patient and the spouse.  Wound Check This is a new problem. The current episode started yesterday. The problem occurs constantly. The problem has been rapidly worsening. Pertinent negatives include no chest pain, no abdominal pain, no headaches and no shortness of breath. Nothing aggravates the symptoms. Nothing relieves the symptoms. He has tried rest for the symptoms. The treatment provided no relief.      Home Medications Prior to Admission medications   Medication Sig Start Date End Date Taking? Authorizing Provider  acetaminophen (TYLENOL) 325 MG tablet Take 2 tablets (650 mg total) by mouth every 6 (six) hours as needed for mild pain (or Fever >/= 101). Patient taking differently: Take 650 mg by mouth every 6 (six) hours as needed for mild pain or headache (or Fever >/= 101). 05/25/19   Swayze, Ava, DO  albuterol (VENTOLIN HFA) 108 (90 Base) MCG/ACT inhaler Inhale 1-2 puffs into the lungs every 6 (six) hours as needed for wheezing or shortness of breath. 07/21/21   [provider]  allopurinol (ZYLOPRIM) 100 MG tablet Take 1 tablet (100 mg total) by mouth daily. 04/26/19   Mercy Riding, MD  amiodarone (PACERONE) 200 MG tablet Take 1 tablet (200 mg total) by mouth daily. 04/26/21   Arnoldo Lenis, MD  atorvastatin (LIPITOR) 40 MG tablet TAKE 1 TABLET BY MOUTH  DAILY 11/08/21   Arnoldo Lenis, MD  AURYXIA 1 GM 210 MG(Fe) tablet Take 210 mg by mouth  daily. 03/17/21   [provider]  B Complex-C-Zn-Folic Acid (DIALYVITE 784-ONGE 15) 0.8 MG TABS Take 1 tablet by mouth daily. 04/22/20   [provider]  cephALEXin (KEFLEX) 500 MG capsule Take 1 capsule (500 mg total) by mouth 3 (three) times daily. Patient not taking: No sig reported 05/25/21   Horton, Barbette Hair, MD  clopidogrel (PLAVIX) 75 MG tablet TAKE 1 TABLET BY MOUTH  DAILY WITH BREAKFAST 11/08/21   Arnoldo Lenis, MD  ELIQUIS 2.5 MG TABS tablet TAKE 1 TABLET BY MOUTH  TWICE DAILY 12/06/20   Arnoldo Lenis, MD  fexofenadine (ALLEGRA) 180 MG tablet Take 180 mg by mouth daily.    [provider]  HYDROcodone-acetaminophen (NORCO) 5-325 MG tablet Take 1 tablet by mouth every 6 (six) hours as needed for moderate pain. 08/29/21   McKenzie, Candee Furbish, MD  HYDROcodone-acetaminophen (NORCO/VICODIN) 5-325 MG tablet Take 1 tablet by mouth every 4 (four) hours as needed for moderate pain. 08/18/21 08/18/22  Setzer, Edman Circle, PA-C  levothyroxine (SYNTHROID, LEVOTHROID) 175 MCG tablet Take 175 mcg by mouth daily before breakfast.     [provider]  lidocaine-prilocaine (EMLA) cream Apply 1 application topically Every Tuesday,Thursday,and Saturday with dialysis. 08/28/20   [provider]  losartan (COZAAR) 25 MG tablet Take 1.5 tablets (37.5 mg total) by mouth daily. 08/24/21 11/22/21  Arnoldo Lenis, MD  metoprolol succinate (TOPROL-XL) 25 MG 24 hr tablet Take 0.5 tablets (12.5 mg total) by mouth daily. 02/16/21   Arnoldo Lenis, MD  omeprazole (PRILOSEC) 40 MG capsule Take 1 capsule (40 mg total) by mouth in the morning and at bedtime. Patient taking differently: Take 40 mg by mouth daily. 07/27/20 08/18/21  Eloise Harman, DO      Allergies    Advair hfa [fluticasone-salmeterol] and Penicillins    Review of Systems   Review of Systems  Constitutional:  Negative for fever.  HENT:  Negative for sore throat.   Eyes:  Negative for visual  disturbance.  Respiratory:  Negative for shortness of breath.   Cardiovascular:  Negative for chest pain.  Gastrointestinal:  Negative for abdominal pain.  Genitourinary:  Negative for dysuria.  Musculoskeletal:  Positive for neck pain.  Skin:  Positive for wound. Negative for rash.  Neurological:  Negative for headaches.   Physical Exam Updated Vital Signs BP (!) 180/95 (BP Location: Right Arm)    Pulse 78    Temp 98.5 F (36.9 C) (Oral)    Resp 18    Ht 5\' 9"  (1.753 m)    Wt 73 kg    SpO2 99%    BMI 23.77 kg/m  Physical Exam Vitals and nursing note reviewed.  Constitutional:      General: He is not in acute distress.    Appearance: Normal appearance. He is well-developed.  HENT:     Head: Normocephalic and atraumatic.  Eyes:     Conjunctiva/sclera: Conjunctivae normal.  Neck:     Comments: He has approximately 2 cm lesion on the left lateral neck with some active bleeding.  Area is tender.  No surrounding erythema. Cardiovascular:     Rate and Rhythm: Normal rate and regular rhythm.     Heart sounds: No murmur heard. Pulmonary:     Effort: Pulmonary effort is normal. No respiratory distress.     Breath sounds: Normal breath sounds.  Abdominal:     Palpations: Abdomen is soft.     Tenderness: There is no abdominal tenderness.  Musculoskeletal:        General: No swelling.     Cervical back: Neck supple. Tenderness present.  Skin:    General: Skin is warm and dry.     Capillary Refill: Capillary refill takes less than 2 seconds.  Neurological:     General: No focal deficit present.     Mental Status: He is alert.  Psychiatric:        Mood and Affect: Mood normal.    ED Results / Procedures / Treatments   Labs (all labs ordered are listed, but only abnormal results are displayed) Labs Reviewed - No data to display  EKG None  Radiology No results found.  Procedures Procedures    Medications Ordered in ED Medications  HYDROcodone-acetaminophen  (NORCO/VICODIN) 5-325 MG per tablet 1 tablet (has no administration in time range)  silver nitrate applicators applicator 1 Stick (has no administration in time range)    ED Course/ Medical Decision Making/ A&P Clinical Course as of 11/17/21 1934  Thu Nov 17, 2021  0742 Bleeding areas were cauterized with silver nitrate and a hemostatic dressing was applied.  Bulky dressing applied. [MB]  0909 Discussed with Dr. [place] Crane dermatology.  He recommends hemostatic pressure dressing. [MB]  1028 Bleeding seems to have stopped after using the quick clot and a pressure dressing.  Recommend he contact Dr. [place] Crane for further evaluation.  Patient was  also seen by Dr. Rich Number with nephrology who is going to talk to the dialysis team regarding not using heparin for his run today. [MB]    Clinical Course User Index [MB] Hayden Rasmussen, MD                           Medical Decision Making  This patient complains of bleeding from prior skin excision; this involves an extensive number of treatment Options and is a complaint that carries with it a high risk of complications and Morbidity. The differential includes hematoma, arterial injury, postop infection, venous injury  Additional history obtained from patient's wife Previous records obtained and reviewed in epic no recent admissions I consulted Dr. [place] Crane dermatology and discussed lab and imaging findings  Critical Interventions: None  After the interventions stated above, I reevaluated the patient and found patient's bleeding to have been slowed for sure.  He is otherwise hemodynamically stable.  Recommended continuing pressure dressing and follow-up with dermatology regarding further management.  Return instructions discussed        Final Clinical Impression(s) / ED Diagnoses Final diagnoses:  Skin lesion of neck  Bleeding    Rx / DC Orders ED Discharge Orders     None         Hayden Rasmussen, MD 11/17/21 Joen Laura

## 2021-11-17 NOTE — ED Triage Notes (Signed)
Pt to ED by POV from home with c/o bleeding to the left side of his neck following a procedure he had yesterday to remove an area of skin cancer tissue. Pt endorses daily use of plavix and elaquis. Pt is a dialysis pt that was scheduled to dialyze today. Arrives with BP elevated, all other VSS, pt rates pain 7/10, pt is holding pressure to the area and bleeding is controlled at this time.

## 2021-11-17 NOTE — Discharge Instructions (Signed)
You were seen in the emergency department for bleeding from your recent skin cancer excision.  We have placed some clotting dressing on it and a bulky dressing to hold pressure.  It seemed to stop bleeding at this time.  If it rebleeds he can hold pressure and apply some ice.  Follow-up with Dr. Nevada Crane.  Return if any worsening or concerning symptoms

## 2021-11-19 DIAGNOSIS — L089 Local infection of the skin and subcutaneous tissue, unspecified: Secondary | ICD-10-CM | POA: Diagnosis not present

## 2021-11-19 DIAGNOSIS — N2581 Secondary hyperparathyroidism of renal origin: Secondary | ICD-10-CM | POA: Diagnosis not present

## 2021-11-19 DIAGNOSIS — N186 End stage renal disease: Secondary | ICD-10-CM | POA: Diagnosis not present

## 2021-11-19 DIAGNOSIS — D631 Anemia in chronic kidney disease: Secondary | ICD-10-CM | POA: Diagnosis not present

## 2021-11-19 DIAGNOSIS — L0889 Other specified local infections of the skin and subcutaneous tissue: Secondary | ICD-10-CM | POA: Diagnosis not present

## 2021-11-19 DIAGNOSIS — T82398A Other mechanical complication of other vascular grafts, initial encounter: Secondary | ICD-10-CM | POA: Diagnosis not present

## 2021-11-19 DIAGNOSIS — Z992 Dependence on renal dialysis: Secondary | ICD-10-CM | POA: Diagnosis not present

## 2021-11-19 DIAGNOSIS — E039 Hypothyroidism, unspecified: Secondary | ICD-10-CM | POA: Diagnosis not present

## 2021-11-19 DIAGNOSIS — D689 Coagulation defect, unspecified: Secondary | ICD-10-CM | POA: Diagnosis not present

## 2021-11-21 ENCOUNTER — Telehealth: Payer: Self-pay

## 2021-11-21 DIAGNOSIS — D689 Coagulation defect, unspecified: Secondary | ICD-10-CM | POA: Diagnosis not present

## 2021-11-21 DIAGNOSIS — D631 Anemia in chronic kidney disease: Secondary | ICD-10-CM | POA: Diagnosis not present

## 2021-11-21 DIAGNOSIS — E039 Hypothyroidism, unspecified: Secondary | ICD-10-CM | POA: Diagnosis not present

## 2021-11-21 DIAGNOSIS — N186 End stage renal disease: Secondary | ICD-10-CM | POA: Diagnosis not present

## 2021-11-21 DIAGNOSIS — Z992 Dependence on renal dialysis: Secondary | ICD-10-CM | POA: Diagnosis not present

## 2021-11-21 DIAGNOSIS — T82398A Other mechanical complication of other vascular grafts, initial encounter: Secondary | ICD-10-CM | POA: Insufficient documentation

## 2021-11-21 DIAGNOSIS — L089 Local infection of the skin and subcutaneous tissue, unspecified: Secondary | ICD-10-CM | POA: Diagnosis not present

## 2021-11-21 DIAGNOSIS — L0889 Other specified local infections of the skin and subcutaneous tissue: Secondary | ICD-10-CM | POA: Diagnosis not present

## 2021-11-21 DIAGNOSIS — N2581 Secondary hyperparathyroidism of renal origin: Secondary | ICD-10-CM | POA: Diagnosis not present

## 2021-11-21 NOTE — Telephone Encounter (Signed)
Pt called to let us know another spot/scab type of lesion is near his HD access. It was very hard to understand what he was trying to convey. He did go to HD today and said a PA there had looked at the area and was going to call someone about it. They have not called our office. I called them to see if there was anything they needed from [place] and was told the PA had left already and they would relay a message to her.

## 2021-11-22 DIAGNOSIS — L0889 Other specified local infections of the skin and subcutaneous tissue: Secondary | ICD-10-CM | POA: Diagnosis not present

## 2021-11-22 DIAGNOSIS — N186 End stage renal disease: Secondary | ICD-10-CM | POA: Diagnosis not present

## 2021-11-22 DIAGNOSIS — T82398A Other mechanical complication of other vascular grafts, initial encounter: Secondary | ICD-10-CM | POA: Diagnosis not present

## 2021-11-22 DIAGNOSIS — N2581 Secondary hyperparathyroidism of renal origin: Secondary | ICD-10-CM | POA: Diagnosis not present

## 2021-11-22 DIAGNOSIS — D689 Coagulation defect, unspecified: Secondary | ICD-10-CM | POA: Diagnosis not present

## 2021-11-22 DIAGNOSIS — D631 Anemia in chronic kidney disease: Secondary | ICD-10-CM | POA: Diagnosis not present

## 2021-11-22 DIAGNOSIS — E039 Hypothyroidism, unspecified: Secondary | ICD-10-CM | POA: Diagnosis not present

## 2021-11-22 DIAGNOSIS — L089 Local infection of the skin and subcutaneous tissue, unspecified: Secondary | ICD-10-CM | POA: Diagnosis not present

## 2021-11-22 DIAGNOSIS — Z992 Dependence on renal dialysis: Secondary | ICD-10-CM | POA: Diagnosis not present

## 2021-11-23 ENCOUNTER — Other Ambulatory Visit: Payer: Self-pay

## 2021-11-23 ENCOUNTER — Ambulatory Visit (INDEPENDENT_AMBULATORY_CARE_PROVIDER_SITE_OTHER): Payer: Medicare Other | Admitting: Physician Assistant

## 2021-11-23 VITALS — BP 160/77 | HR 69 | Temp 98.1°F | Resp 16 | Ht 69.0 in | Wt 159.0 lb

## 2021-11-23 DIAGNOSIS — Z992 Dependence on renal dialysis: Secondary | ICD-10-CM | POA: Diagnosis not present

## 2021-11-23 DIAGNOSIS — N186 End stage renal disease: Secondary | ICD-10-CM | POA: Diagnosis not present

## 2021-11-23 NOTE — Progress Notes (Addendum)
HISTORY AND PHYSICAL     CC:  dialysis access Requesting Provider:  [name], MD  HPI: This is a 76 y.o. male here for evaluation of his hemodialysis access.  Pt has hx of ESRD and most recently underwent revision of his graft for ulceration on 08/18/2021 by Dr. Trula Slade.  He is here with his wife.  He states that the dialysis center was sticking him above the incision for 12 weeks and it was working well.  They recently started sticking below the incision.  He states about a couple of weeks ago, he developed a new ulcer over the graft but it was smaller than the last one.  He states the scab has come off.  He has oozed but not frank bleeding.  He states they had trouble accessing his graft over the weekend but when he went back on Monday, it worked just fine.  He recently had some skin cancers removed and had some bleeding from these but they were able to be stopped with manual pressure.    Dialysis access history: -left BC AVF 04/25/2019 by Dr. Scot Dock -LUA AVG 05/19/2019 by Dr. Scot Dock -excision of LUA AVG with revision 08/18/2021 by Dr. Trula Slade  He is on Dialysis T/T/S at [facility] in [place].   He has hx of open AAA repair by Dr. Amedeo Plenty 06/20/2005.  He has hx of lung cancer and treatment as well.   The pt is on a statin for cholesterol management.  The pt is not on a daily aspirin.  Other AC:  Eliquis/Plavix The pt is on BB, ARB for hypertension.  The pt is not diabetic.   Tobacco hx:  former  Past Medical History:  Diagnosis Date   AAA (abdominal aortic aneurysm)    3.2 cm by 2020 CT; recheck in 3 years recommended   Anemia    Blood transfusion without reported diagnosis    CAD (coronary artery disease)    Stent to [place]; DES x2 to CX 2021   Chronic kidney disease    ESRD on HD   COPD (chronic obstructive pulmonary disease) (HCC)    Degenerative joint disease (DJD) of lumbar spine    GERD (gastroesophageal reflux disease)    Gout    Hyperlipidemia    Hypertension     Hypothyroidism    Incisional hernia    abdomen   Leukocytosis    CHRONIC MILD   Myocardial infarction ([place])    1997   SCL CA dx'd 01/2019   Lung cancer   Thoracic aortic aneurysm    4.5cm aortic arch by 07/20/21 CT    Past Surgical History:  Procedure Laterality Date   ABDOMINAL AORTIC ANEURYSM REPAIR  2006   AV FISTULA PLACEMENT Left 04/25/2019   Procedure: ARTERIOVENOUS (AV) FISTULA CREATION LEFT ARM;  Surgeon: Angelia Mould, MD;  Location: [place];  Service: Vascular;  Laterality: Left;   AV FISTULA PLACEMENT Left 05/19/2019   Procedure: CONVERSION OF LEFT ARM ARTERIOVENOUS FISTULA TO GRAFT;  Surgeon: Angelia Mould, MD;  Location: [place];  Service: Vascular;  Laterality: Left;   BIOPSY  09/30/2018   Procedure: BIOPSY;  Surgeon: Danie Binder, MD;  [facility];  Service: Endoscopy;;  ascending colon   BIOPSY  07/27/2020   Procedure: BIOPSY;  Surgeon: Eloise Harman, DO;  [facility];  Service: Endoscopy;;  gastric   BUBBLE STUDY  09/23/2020   Procedure: BUBBLE STUDY;  Surgeon: Skeet Latch, MD;  Location: [place];  Service: Cardiovascular;;   CARDIOVERSION N/A 09/23/2020   Procedure: CARDIOVERSION;  Surgeon: Skeet Latch, MD;  Location: [place];  Service: Cardiovascular;  Laterality: N/A;   COLONOSCOPY  2008   COLONOSCOPY N/A 09/30/2018   External and internal hemorrhoids, six polyps removed, one ascending colon polypoid lesion biopsied. Six simple adenomas and one benign polypoid lesion. Colonoscopy Nov 2022.    COLONOSCOPY WITH PROPOFOL N/A 07/27/2020   non-bleeding internal hemorrhoids, sigmoid and descending colon diverticulosis, four 1-2 mm polyps in ascending colon, one 5 mm polyp in transverse colon. 3 year surveillance. Tubular adenomas.    CORONARY ANGIOPLASTY WITH STENT PLACEMENT  1997   MID CIRCUMFLEX   CORONARY STENT INTERVENTION N/A 09/20/2020   Procedure: CORONARY STENT INTERVENTION;  Surgeon: Martinique,  Peter M, MD;  Location: [place] [facility];  Service: Cardiovascular;  Laterality: N/A;   CYSTOSCOPY N/A 08/29/2021   Procedure: CYSTOSCOPY;  Surgeon: Cleon Gustin, MD;  [facility]: [facility];  Service: Urology;  Laterality: N/A;   CYSTOSCOPY W/ RETROGRADES Bilateral 03/28/2021   Procedure: CYSTOSCOPY WITH RETROGRADE PYELOGRAM;  Surgeon: Cleon Gustin, MD;  Location: [facility];  Service: Urology;  Laterality: Bilateral;   ESOPHAGOGASTRODUODENOSCOPY (EGD) WITH PROPOFOL N/A 07/27/2020   Food in middle third of esophagus, gastritis s/p biopsy, nodular mucosa in lesser curvature of stomach s/p biopsy. Negative H.pylori.    HIP ARTHROPLASTY Right 04/30/2020   Procedure: ARTHROPLASTY  HIP (HEMIARTHROPLASTY);  Surgeon: Altamese Coldwater, MD;  Location: [place];  Service: Orthopedics;  Laterality: Right;   INTRAVASCULAR ULTRASOUND/IVUS N/A 09/20/2020   Procedure: Intravascular Ultrasound/IVUS;  Surgeon: Martinique, Peter M, MD;  Location: [place] [facility];  Service: Cardiovascular;  Laterality: N/A;   IR FLUORO GUIDE CV LINE RIGHT  04/22/2019   IR FLUORO GUIDE CV LINE RIGHT  05/01/2020   IR THORACENTESIS ASP PLEURAL SPACE W/IMG GUIDE  09/22/2020   IR THROMBECTOMY AV FISTULA W/THROMBOLYSIS/PTA INC/SHUNT/IMG LEFT Left 05/03/2020   IR US GUIDE VASC ACCESS LEFT  05/03/2020   IR US GUIDE VASC ACCESS RIGHT  04/22/2019   IR US GUIDE VASC ACCESS RIGHT  05/01/2020   JOINT REPLACEMENT     Right   POLYPECTOMY  09/30/2018   Procedure: POLYPECTOMY;  Surgeon: Danie Binder, MD;  [facility];  Service: Endoscopy;;  colon   POLYPECTOMY  07/27/2020   Procedure: POLYPECTOMY;  Surgeon: Eloise Harman, DO;  [facility];  Service: Endoscopy;;   REVISION OF ARTERIOVENOUS GORETEX GRAFT Left 08/18/2021   Procedure: REVISION OF LEFT ARTERIOVENOUS GORETEX GRAFT;  Surgeon: Serafina Mitchell, MD;  [facility];  Service: Vascular;  Laterality: Left;  PERIPHERAL NERVE BLOCK   RIGHT/LEFT HEART CATH  AND CORONARY ANGIOGRAPHY N/A 09/20/2020   Procedure: RIGHT/LEFT HEART CATH AND CORONARY ANGIOGRAPHY;  Surgeon: Martinique, Peter M, MD;  Location: [place] [facility];  Service: Cardiovascular;  Laterality: N/A;   TEE WITHOUT CARDIOVERSION N/A 09/23/2020   Procedure: TRANSESOPHAGEAL ECHOCARDIOGRAM (TEE);  Surgeon: Skeet Latch, MD;  Location: [place];  Service: Cardiovascular;  Laterality: N/A;   TRANSURETHRAL RESECTION OF BLADDER TUMOR N/A 03/28/2021   Procedure: TRANSURETHRAL RESECTION OF BLADDER TUMOR (TURBT);  Surgeon: Cleon Gustin, MD;  [facility];  Service: Urology;  Laterality: N/A;   TRANSURETHRAL RESECTION OF BLADDER TUMOR N/A 08/29/2021   Procedure: TRANSURETHRAL RESECTION OF BLADDER TUMOR (TURBT);  Surgeon: Cleon Gustin, MD;  [facility];  Service: Urology;  Laterality: N/A;   VIDEO BRONCHOSCOPY WITH ENDOBRONCHIAL NAVIGATION N/A 01/27/2019  Procedure: VIDEO BRONCHOSCOPY WITH ENDOBRONCHIAL NAVIGATION;  Surgeon: Grace Isaac, MD;  Location: [place];  Service: Thoracic;  Laterality: N/A;   VIDEO BRONCHOSCOPY WITH ENDOBRONCHIAL ULTRASOUND N/A 01/27/2019   Procedure: VIDEO BRONCHOSCOPY WITH ENDOBRONCHIAL ULTRASOUND;  Surgeon: Grace Isaac, MD;  Location: [place];  Service: Thoracic;  Laterality: N/A;    Allergies  Allergen Reactions   Advair Hfa [Fluticasone-Salmeterol] Other (See Comments)    Developed thrush, although the mouth WAS being rinsed as directed   Penicillins Rash    Has patient had a PCN reaction causing immediate rash, facial/tongue/throat swelling, SOB or lightheadedness with hypotension: No Has patient had a PCN reaction causing severe rash involving mucus membranes or skin necrosis: No Has patient had a PCN reaction that required hospitalization: No Has patient had a PCN reaction occurring within the last 10 years: No If all of the above answers are "NO", then may proceed with Cephalosporin use.     Current Outpatient  Medications  Medication Sig Dispense Refill   acetaminophen (TYLENOL) 325 MG tablet Take 2 tablets (650 mg total) by mouth every 6 (six) hours as needed for mild pain (or Fever >/= 101). (Patient taking differently: Take 650 mg by mouth every 6 (six) hours as needed for mild pain or headache (or Fever >/= 101).) 30 tablet 0   albuterol (VENTOLIN HFA) 108 (90 Base) MCG/ACT inhaler Inhale 1-2 puffs into the lungs every 6 (six) hours as needed for wheezing or shortness of breath.     allopurinol (ZYLOPRIM) 100 MG tablet Take 1 tablet (100 mg total) by mouth daily. 30 tablet 1   amiodarone (PACERONE) 200 MG tablet Take 1 tablet (200 mg total) by mouth daily. 10 tablet 3   atorvastatin (LIPITOR) 40 MG tablet TAKE 1 TABLET BY MOUTH  DAILY 90 tablet 3   AURYXIA 1 GM 210 MG(Fe) tablet Take 210 mg by mouth daily.     B Complex-C-Zn-Folic Acid (DIALYVITE 785-YIFO 15) 0.8 MG TABS Take 1 tablet by mouth daily.     cephALEXin (KEFLEX) 500 MG capsule Take 1 capsule (500 mg total) by mouth 3 (three) times daily. (Patient not taking: No sig reported) 21 capsule 0   clopidogrel (PLAVIX) 75 MG tablet TAKE 1 TABLET BY MOUTH  DAILY WITH BREAKFAST 90 tablet 3   ELIQUIS 2.5 MG TABS tablet TAKE 1 TABLET BY MOUTH  TWICE DAILY 180 tablet 3   HYDROcodone-acetaminophen (NORCO) 5-325 MG tablet Take 1 tablet by mouth every 6 (six) hours as needed for moderate pain. 30 tablet 0   HYDROcodone-acetaminophen (NORCO/VICODIN) 5-325 MG tablet Take 1 tablet by mouth every 4 (four) hours as needed for moderate pain. 10 tablet 0   levocetirizine (XYZAL) 5 MG tablet Take 5 mg by mouth daily.     levothyroxine (SYNTHROID, LEVOTHROID) 175 MCG tablet Take 175 mcg by mouth daily before breakfast.      lidocaine-prilocaine (EMLA) cream Apply 1 application topically Every Tuesday,Thursday,and Saturday with dialysis.     losartan (COZAAR) 25 MG tablet Take 1.5 tablets (37.5 mg total) by mouth daily. 135 tablet 3   metoprolol succinate  (TOPROL-XL) 25 MG 24 hr tablet Take 0.5 tablets (12.5 mg total) by mouth daily. 45 tablet 3   omeprazole (PRILOSEC) 40 MG capsule Take 1 capsule (40 mg total) by mouth in the morning and at bedtime. (Patient taking differently: Take 40 mg by mouth daily.) 60 capsule 5   pramipexole (MIRAPEX) 0.25 MG tablet Take 0.25 mg by mouth at bedtime.  No current facility-administered medications for this visit.    Family History  Problem Relation Age of Onset   Stroke Brother    Lung cancer Sister 49       lung cancer/former   Colon cancer Neg Hx    Colon polyps Neg Hx     Social History   Socioeconomic History   Marital status: Married    Spouse name: Not on file   Number of children: 2   Years of education: Not on file   Highest education level: Not on file  Occupational History    Comment: retired  Tobacco Use   Smoking status: Former    Packs/day: 0.50    Years: 54.00    Pack years: 27.00    Types: Cigarettes    Quit date: 09/17/2017    Years since quitting: 4.1   Smokeless tobacco: Never  Vaping Use   Vaping Use: Never used  Substance and Sexual Activity   Alcohol use: Not Currently    Comment: occasional   Drug use: No   Sexual activity: Not Currently  Other Topics Concern   Not on file  Social History Narrative   Not on file   Social Determinants of Health   Financial Resource Strain: Not on file  Food Insecurity: Not on file  Transportation Needs: Not on file  Physical Activity: Not on file  Stress: Not on file  Social Connections: Not on file  Intimate Partner Violence: Not on file     ROS: [x]  Positive   [ ]  Negative   [ ]  All sytems reviewed and are negative  Cardiac: []  chest pain/pressure []  SOB []  DOE  Vascular: []  pain in legs while walking []  pain in feet when lying flat []  hx of DVT []  swelling in legs  Pulmonary: []  asthma []  wheezing  Neurologic: []  weakness in []  arms []  legs []  numbness in []  arms []  legs [] difficulty speaking  or slurred speech  Hematologic: []  bleeding problems  GI []  GERD  GU: [x]  CKD/renal failure  [x]  HD---[]  M/W/F [x]  T/T/S  Psychiatric: []  hx of major depression  Integumentary: []  rashes []  ulcers  Constitutional: []  fever []  chills   PHYSICAL EXAMINATION:  Today's Vitals   11/23/21 0936  BP: (!) 160/77  Pulse: 69  Resp: 16  Temp: 98.1 F (36.7 C)  TempSrc: Temporal  SpO2: 99%  Weight: 159 lb (72.1 kg)  Height: 5\' 9"  (1.753 m)   Body mass index is 23.48 kg/m.   General:  WDWN male in NAD Gait: Not observed HENT: WNL Pulmonary: normal non-labored breathing  Cardiac: regular Skin: without rashes Vascular Exam/Pulses:  The LUA AVG has an excellent thrill; there is a superficial ulceration over the previous repair.     Musculoskeletal: no muscle wasting or atrophy  Neurologic: A&O X 3; Speech is fluent/normal  Non-Invasive Vascular Imaging:   None today   ASSESSMENT/PLAN: 76 y.o. male with ESRD here for evaluation of his hemodialysis access with hx of LUA AVG placement July 2020 and revision for ulceration August 18, 2021 by Dr. Trula Slade.  -pt seen with Dr. Scot Dock and feel this area is superficial.  A couple of steri strips were placed over the area.  Pt is instructed to leave them in place and he can shower with soap and water.  We will see him back in 3 weeks to make sure this is healing.   -discussed with pt and wife how to hold pressure should this bleed and call  911.   -pt is on dialysis    Leontine Locket, [facility] [phone]  Clinic MD:   pt seen with Dr. Scot Dock

## 2021-11-24 DIAGNOSIS — L089 Local infection of the skin and subcutaneous tissue, unspecified: Secondary | ICD-10-CM | POA: Insufficient documentation

## 2021-11-24 DIAGNOSIS — N186 End stage renal disease: Secondary | ICD-10-CM | POA: Diagnosis not present

## 2021-11-24 DIAGNOSIS — D689 Coagulation defect, unspecified: Secondary | ICD-10-CM | POA: Diagnosis not present

## 2021-11-24 DIAGNOSIS — D631 Anemia in chronic kidney disease: Secondary | ICD-10-CM | POA: Diagnosis not present

## 2021-11-24 DIAGNOSIS — N2581 Secondary hyperparathyroidism of renal origin: Secondary | ICD-10-CM | POA: Diagnosis not present

## 2021-11-24 DIAGNOSIS — L0889 Other specified local infections of the skin and subcutaneous tissue: Secondary | ICD-10-CM | POA: Diagnosis not present

## 2021-11-24 DIAGNOSIS — T82398A Other mechanical complication of other vascular grafts, initial encounter: Secondary | ICD-10-CM | POA: Diagnosis not present

## 2021-11-24 DIAGNOSIS — Z992 Dependence on renal dialysis: Secondary | ICD-10-CM | POA: Diagnosis not present

## 2021-11-24 DIAGNOSIS — E039 Hypothyroidism, unspecified: Secondary | ICD-10-CM | POA: Diagnosis not present

## 2021-11-26 DIAGNOSIS — E039 Hypothyroidism, unspecified: Secondary | ICD-10-CM | POA: Diagnosis not present

## 2021-11-26 DIAGNOSIS — T82398A Other mechanical complication of other vascular grafts, initial encounter: Secondary | ICD-10-CM | POA: Diagnosis not present

## 2021-11-26 DIAGNOSIS — D689 Coagulation defect, unspecified: Secondary | ICD-10-CM | POA: Diagnosis not present

## 2021-11-26 DIAGNOSIS — Z992 Dependence on renal dialysis: Secondary | ICD-10-CM | POA: Diagnosis not present

## 2021-11-26 DIAGNOSIS — N186 End stage renal disease: Secondary | ICD-10-CM | POA: Diagnosis not present

## 2021-11-26 DIAGNOSIS — L089 Local infection of the skin and subcutaneous tissue, unspecified: Secondary | ICD-10-CM | POA: Diagnosis not present

## 2021-11-26 DIAGNOSIS — N2581 Secondary hyperparathyroidism of renal origin: Secondary | ICD-10-CM | POA: Diagnosis not present

## 2021-11-26 DIAGNOSIS — L0889 Other specified local infections of the skin and subcutaneous tissue: Secondary | ICD-10-CM | POA: Diagnosis not present

## 2021-11-26 DIAGNOSIS — D631 Anemia in chronic kidney disease: Secondary | ICD-10-CM | POA: Diagnosis not present

## 2021-11-29 DIAGNOSIS — E039 Hypothyroidism, unspecified: Secondary | ICD-10-CM | POA: Diagnosis not present

## 2021-11-29 DIAGNOSIS — N186 End stage renal disease: Secondary | ICD-10-CM | POA: Diagnosis not present

## 2021-11-29 DIAGNOSIS — N2581 Secondary hyperparathyroidism of renal origin: Secondary | ICD-10-CM | POA: Diagnosis not present

## 2021-11-29 DIAGNOSIS — L089 Local infection of the skin and subcutaneous tissue, unspecified: Secondary | ICD-10-CM | POA: Diagnosis not present

## 2021-11-29 DIAGNOSIS — Z992 Dependence on renal dialysis: Secondary | ICD-10-CM | POA: Diagnosis not present

## 2021-11-29 DIAGNOSIS — D689 Coagulation defect, unspecified: Secondary | ICD-10-CM | POA: Diagnosis not present

## 2021-11-29 DIAGNOSIS — T82398A Other mechanical complication of other vascular grafts, initial encounter: Secondary | ICD-10-CM | POA: Diagnosis not present

## 2021-11-29 DIAGNOSIS — D631 Anemia in chronic kidney disease: Secondary | ICD-10-CM | POA: Diagnosis not present

## 2021-11-29 DIAGNOSIS — L0889 Other specified local infections of the skin and subcutaneous tissue: Secondary | ICD-10-CM | POA: Diagnosis not present

## 2021-12-01 DIAGNOSIS — D689 Coagulation defect, unspecified: Secondary | ICD-10-CM | POA: Diagnosis not present

## 2021-12-01 DIAGNOSIS — D631 Anemia in chronic kidney disease: Secondary | ICD-10-CM | POA: Diagnosis not present

## 2021-12-01 DIAGNOSIS — E039 Hypothyroidism, unspecified: Secondary | ICD-10-CM | POA: Diagnosis not present

## 2021-12-01 DIAGNOSIS — T82398A Other mechanical complication of other vascular grafts, initial encounter: Secondary | ICD-10-CM | POA: Diagnosis not present

## 2021-12-01 DIAGNOSIS — L0889 Other specified local infections of the skin and subcutaneous tissue: Secondary | ICD-10-CM | POA: Diagnosis not present

## 2021-12-01 DIAGNOSIS — L089 Local infection of the skin and subcutaneous tissue, unspecified: Secondary | ICD-10-CM | POA: Diagnosis not present

## 2021-12-01 DIAGNOSIS — Z992 Dependence on renal dialysis: Secondary | ICD-10-CM | POA: Diagnosis not present

## 2021-12-01 DIAGNOSIS — N2581 Secondary hyperparathyroidism of renal origin: Secondary | ICD-10-CM | POA: Diagnosis not present

## 2021-12-01 DIAGNOSIS — N186 End stage renal disease: Secondary | ICD-10-CM | POA: Diagnosis not present

## 2021-12-03 DIAGNOSIS — T82398A Other mechanical complication of other vascular grafts, initial encounter: Secondary | ICD-10-CM | POA: Diagnosis not present

## 2021-12-03 DIAGNOSIS — L0889 Other specified local infections of the skin and subcutaneous tissue: Secondary | ICD-10-CM | POA: Diagnosis not present

## 2021-12-03 DIAGNOSIS — D689 Coagulation defect, unspecified: Secondary | ICD-10-CM | POA: Diagnosis not present

## 2021-12-03 DIAGNOSIS — E039 Hypothyroidism, unspecified: Secondary | ICD-10-CM | POA: Diagnosis not present

## 2021-12-03 DIAGNOSIS — N186 End stage renal disease: Secondary | ICD-10-CM | POA: Diagnosis not present

## 2021-12-03 DIAGNOSIS — N2581 Secondary hyperparathyroidism of renal origin: Secondary | ICD-10-CM | POA: Diagnosis not present

## 2021-12-03 DIAGNOSIS — L089 Local infection of the skin and subcutaneous tissue, unspecified: Secondary | ICD-10-CM | POA: Diagnosis not present

## 2021-12-03 DIAGNOSIS — D631 Anemia in chronic kidney disease: Secondary | ICD-10-CM | POA: Diagnosis not present

## 2021-12-03 DIAGNOSIS — Z992 Dependence on renal dialysis: Secondary | ICD-10-CM | POA: Diagnosis not present

## 2021-12-06 DIAGNOSIS — L089 Local infection of the skin and subcutaneous tissue, unspecified: Secondary | ICD-10-CM | POA: Diagnosis not present

## 2021-12-06 DIAGNOSIS — D631 Anemia in chronic kidney disease: Secondary | ICD-10-CM | POA: Diagnosis not present

## 2021-12-06 DIAGNOSIS — L0889 Other specified local infections of the skin and subcutaneous tissue: Secondary | ICD-10-CM | POA: Diagnosis not present

## 2021-12-06 DIAGNOSIS — D689 Coagulation defect, unspecified: Secondary | ICD-10-CM | POA: Diagnosis not present

## 2021-12-06 DIAGNOSIS — N186 End stage renal disease: Secondary | ICD-10-CM | POA: Diagnosis not present

## 2021-12-06 DIAGNOSIS — E039 Hypothyroidism, unspecified: Secondary | ICD-10-CM | POA: Diagnosis not present

## 2021-12-06 DIAGNOSIS — N2581 Secondary hyperparathyroidism of renal origin: Secondary | ICD-10-CM | POA: Diagnosis not present

## 2021-12-06 DIAGNOSIS — Z992 Dependence on renal dialysis: Secondary | ICD-10-CM | POA: Diagnosis not present

## 2021-12-06 DIAGNOSIS — T82398A Other mechanical complication of other vascular grafts, initial encounter: Secondary | ICD-10-CM | POA: Diagnosis not present

## 2021-12-07 ENCOUNTER — Other Ambulatory Visit: Payer: Self-pay

## 2021-12-07 ENCOUNTER — Encounter: Payer: Self-pay | Admitting: Urology

## 2021-12-07 ENCOUNTER — Ambulatory Visit (INDEPENDENT_AMBULATORY_CARE_PROVIDER_SITE_OTHER): Payer: Medicare Other | Admitting: Urology

## 2021-12-07 VITALS — BP 149/69 | HR 71

## 2021-12-07 DIAGNOSIS — R310 Gross hematuria: Secondary | ICD-10-CM

## 2021-12-07 DIAGNOSIS — C678 Malignant neoplasm of overlapping sites of bladder: Secondary | ICD-10-CM | POA: Diagnosis not present

## 2021-12-07 DIAGNOSIS — C679 Malignant neoplasm of bladder, unspecified: Secondary | ICD-10-CM | POA: Diagnosis not present

## 2021-12-07 LAB — URINALYSIS, ROUTINE W REFLEX MICROSCOPIC
Bilirubin, UA: NEGATIVE
Glucose, UA: NEGATIVE
Ketones, UA: NEGATIVE
Nitrite, UA: NEGATIVE
Specific Gravity, UA: 1.015 (ref 1.005–1.030)
Urobilinogen, Ur: 0.2 mg/dL (ref 0.2–1.0)
pH, UA: 6.5 (ref 5.0–7.5)

## 2021-12-07 LAB — MICROSCOPIC EXAMINATION
RBC, Urine: >30 /hpf — AB (ref 0–2)
Renal Epithel, UA: NONE SEEN /hpf

## 2021-12-07 MED ORDER — CEPHALEXIN 250 MG PO CAPS
500.0000 mg | ORAL_CAPSULE | Freq: Once | ORAL | Status: AC
Start: 2021-12-07 — End: 2021-12-07
  Administered 2021-12-07: 11:00:00 500 mg via ORAL

## 2021-12-07 NOTE — Patient Instructions (Signed)
Bladder Cancer Bladder cancer is a condition in which abnormal tissue (a tumor) grows in the bladder. The bladder is the organ that holds urine. Two tubes (ureters) carry the urine from the kidneys to the bladder. The bladder wall is made of layers of tissue. Cancer that spreads through these layers of the bladder wall becomes more difficult to treat. What are the causes? The cause of this condition is not known. What increases the risk? The following factors may make you more likely to develop this condition: Smoking. Working where there are risks (occupational exposures), such as working with rubber, leather, clothing fabric, dyes, chemicals, and paint. Being 76 years of age or older. Being male. Having bladder inflammation that is long-term (chronic). Having a history of cancer, including: A family history of bladder cancer. Personal experience with bladder cancer. Having had certain treatments for cancer before. These include: Medicines to kill cancer cells (chemotherapy). Strong X-ray beams or capsules high in energy to kill cancer cells and shrink tumors (radiation therapy). Having been exposed to arsenic. This is a chemical element that can poison you. What are the signs or symptoms? Early symptoms of this condition include: Seeing blood in your urine. Feeling pain when urinating. Having infections of your urinary system (urinary tract infections or UTIs) that happen often. Having to urinate sooner or more often than usual. Later symptoms of this condition include: Not being able to urinate. Pain on one side of your lower back. Loss of appetite. Weight loss. Tiredness (fatigue). Swelling in your feet. Bone pain. How is this diagnosed? This condition is diagnosed based on: Your medical history. A physical exam. Lab tests, such as urine tests. Imaging tests. Your symptoms. You may also have other tests or procedures done, such as: A cystoscopy. A narrow tube is inserted  into your bladder through the organ that connects your bladder to the outside of your body (urethra). This is done to view the lining of your bladder for tumors. A biopsy. This procedure involves removing a tissue sample to look at it under a microscope to see if cancer is present. It is important to find out: How deeply into the bladder wall cancer has grown. Whether cancer has spread to any other parts of your body. This may require blood tests or imaging tests, such as a CT scan, MRI, bone scan, or X-rays. How is this treated? Your health care provider may recommend one or more types of treatment based on the stage of your cancer. The most common types of treatment are: Surgery to remove the cancer. Procedures that may be done include: Removing a tumor on the inside wall of the bladder (transurethral resection). Removing the bladder (cystectomy). Radiation therapy. This is often used together with chemotherapy. Chemotherapy. Immunotherapy. This uses medicines to help your immune system destroy cancer cells. Follow these instructions at home: Take over-the-counter and prescription medicines only as told by your health care provider. Eat a healthy diet. Some of your treatments might affect your appetite. Do not use any products that contain nicotine or tobacco, such as cigarettes, e-cigarettes, and chewing tobacco. If you need help quitting, ask your health care provider. Consider joining a support group. This may help you learn to cope with the stress of having bladder cancer. Tell your cancer care team if you develop side effects. Your team may be able to recommend ways to get relief. Keep all follow-up visits as told by your health care provider. This is important. Where to find more information American   Cancer Society: www.cancer.org National Cancer Institute (NCI): www.cancer.gov Contact a health care provider if: You have symptoms of a urinary tract infection. These  include: Fever. Chills. Weakness. Muscle aches. Pain in your abdomen. Urge to urinate that is stronger and happens more often than usual. Burning feeling in the bladder or urethra when you urinate. Get help right away if: There is blood in your urine. You cannot urinate. You have severe pain or other symptoms that do not go away. Summary Bladder cancer is a condition in which tumors grow in the bladder and cause illness. This condition is diagnosed based on your medical history, a physical exam, lab tests, imaging tests, and your symptoms. Your health care provider may recommend one or more types of treatment based on the stage of your cancer. Consider joining a support group. This may help you learn to cope with the stress of having bladder cancer. This information is not intended to replace advice given to you by your health care provider. Make sure you discuss any questions you have with your health care provider. Document Revised: 07/09/2019 Document Reviewed: 07/09/2019 Elsevier Patient Education  2022 Elsevier Inc.  

## 2021-12-07 NOTE — Progress Notes (Signed)
° °  12/07/21  CC: followup bladder cancer   HPI: Jeremy Johnson is a 76yo here for surveillance cystoscopy Blood pressure (!) 149/69, pulse 71. NED. A&Ox3.   No respiratory distress   Abd soft, NT, ND Normal phallus with bilateral descended testicles  Cystoscopy Procedure Note  Patient identification was confirmed, informed consent was obtained, and patient was prepped using Betadine solution.  Lidocaine jelly was administered per urethral meatus.     Pre-Procedure: - Inspection reveals a normal caliber ureteral meatus.  Procedure: The flexible cystoscope was introduced without difficulty - No urethral strictures/lesions are present. - Enlarged prostate  - Normal bladder neck - Bilateral ureteral orifices identified - Bladder mucosa  reveals no ulcers, tumors, or lesions - No bladder stones - No trabeculation    Post-Procedure: - Patient tolerated the procedure well  Assessment/ Plan: Urine for cytology RTC 3 months for cystoscopy  No follow-ups on file.  Nicolette Bang, MD

## 2021-12-08 DIAGNOSIS — L089 Local infection of the skin and subcutaneous tissue, unspecified: Secondary | ICD-10-CM | POA: Diagnosis not present

## 2021-12-08 DIAGNOSIS — D631 Anemia in chronic kidney disease: Secondary | ICD-10-CM | POA: Diagnosis not present

## 2021-12-08 DIAGNOSIS — L0889 Other specified local infections of the skin and subcutaneous tissue: Secondary | ICD-10-CM | POA: Diagnosis not present

## 2021-12-08 DIAGNOSIS — N2581 Secondary hyperparathyroidism of renal origin: Secondary | ICD-10-CM | POA: Diagnosis not present

## 2021-12-08 DIAGNOSIS — T82398A Other mechanical complication of other vascular grafts, initial encounter: Secondary | ICD-10-CM | POA: Diagnosis not present

## 2021-12-08 DIAGNOSIS — E039 Hypothyroidism, unspecified: Secondary | ICD-10-CM | POA: Diagnosis not present

## 2021-12-08 DIAGNOSIS — D689 Coagulation defect, unspecified: Secondary | ICD-10-CM | POA: Diagnosis not present

## 2021-12-08 DIAGNOSIS — Z992 Dependence on renal dialysis: Secondary | ICD-10-CM | POA: Diagnosis not present

## 2021-12-08 DIAGNOSIS — N186 End stage renal disease: Secondary | ICD-10-CM | POA: Diagnosis not present

## 2021-12-08 LAB — CYTOLOGY, URINE

## 2021-12-09 ENCOUNTER — Ambulatory Visit (INDEPENDENT_AMBULATORY_CARE_PROVIDER_SITE_OTHER): Payer: Medicare Other | Admitting: Cardiology

## 2021-12-09 ENCOUNTER — Encounter: Payer: Self-pay | Admitting: Cardiology

## 2021-12-09 VITALS — BP 178/80 | HR 65 | Ht 69.0 in | Wt 159.0 lb

## 2021-12-09 DIAGNOSIS — I4892 Unspecified atrial flutter: Secondary | ICD-10-CM | POA: Diagnosis not present

## 2021-12-09 DIAGNOSIS — I5022 Chronic systolic (congestive) heart failure: Secondary | ICD-10-CM

## 2021-12-09 DIAGNOSIS — I10 Essential (primary) hypertension: Secondary | ICD-10-CM

## 2021-12-09 DIAGNOSIS — I2510 Atherosclerotic heart disease of native coronary artery without angina pectoris: Secondary | ICD-10-CM

## 2021-12-09 MED ORDER — SACUBITRIL-VALSARTAN 49-51 MG PO TABS: 1.0000 | ORAL_TABLET | Freq: Two times a day (BID) | ORAL | 5 refills | Status: DC

## 2021-12-09 MED ORDER — APIXABAN 5 MG PO TABS: 5.0000 mg | ORAL_TABLET | Freq: Two times a day (BID) | ORAL | 5 refills | Status: DC

## 2021-12-09 NOTE — Patient Instructions (Addendum)
Medication Instructions:  STOP Plavix and Losartan START Eliquis 5 mg tablets twice daily START Entresto 49-51 mg tablets twice daily  Follow-Up: Follow up with Dr. Harl Bowie in 4 months.   If you need a refill on your cardiac medications before your next appointment, please call your pharmacy.

## 2021-12-09 NOTE — Progress Notes (Signed)
Clinical Summary Jeremy Johnson is a 76 y.o.male seen today for follow up of the following medical problems.    1. CAD - prior stent to LCX in 1997 - 09/2020 DES x2 to LCX. Had some hemorroidal bleeing on triple therapy, plan for eliquis and plavix for one year    - no recent chest pains - no SOB/DOE - compliant with meds, has continued plavix since our last visit     2. Chronic systolic HF - new diagnosis during 09/2020 admission - 09/2020 echo LVEF 15-20%, grade III dd, mild RV dysfunction -12/2020 echo LVEF 25-30%, normal RV function   - low bp's on HD had limited medical therapy. Nephrology increasing dry weight but ongoing issue, prevoiusly had been on midodrine   - he is hesitant for ICD consideration, favors ongoing medical therapy.   -chronic stable SOB/DOE. No recent edema.    3. ESRD -prior low bp's on HD had been a chronic issue - more recently has been having high bp's with HD   4. Aflutter - s/p TEE/DCCV during 09/2020 admission - started on amio   - no palpitations - occasional hematuria, has bladder tumor history   5. Thoracic aneurysm 4.2cm thoracic aortic aneurysm at the arch, 3.9cm at the diaphragm and 3cm at the infrarenal aorta - Penetrating atherosclerotic ulcer noted along the undersurface of the aortic arch adjacent to the area of maximal dilation. This demonstrates interval decrease in size with increasing mural thrombus when compared to prior examination.  - followed by CT surgery   01/2021 CT stable 4.5 cm aneurysm  - 07/2021 stable 4.5 cm   6. Lung cancer - followed by oncology - has completed chemo       7. Bladder tumor - followed by urology     Past Medical History:  Diagnosis Date   AAA (abdominal aortic aneurysm)    3.2 cm by 2020 CT; recheck in 3 years recommended   Anemia    Blood transfusion without reported diagnosis    CAD (coronary artery disease)    Stent to [place]; DES x2 to CX 2021   Chronic kidney disease     ESRD on HD   COPD (chronic obstructive pulmonary disease) (HCC)    Degenerative joint disease (DJD) of lumbar spine    GERD (gastroesophageal reflux disease)    Gout    Hyperlipidemia    Hypertension    Hypothyroidism    Incisional hernia    abdomen   Leukocytosis    CHRONIC MILD   Myocardial infarction ([place])    1997   SCL CA dx'd 01/2019   Lung cancer   Thoracic aortic aneurysm    4.5cm aortic arch by 07/20/21 CT     Allergies  Allergen Reactions   Advair Hfa [Fluticasone-Salmeterol] Other (See Comments)    Developed thrush, although the mouth WAS being rinsed as directed   Penicillins Rash    Has patient had a PCN reaction causing immediate rash, facial/tongue/throat swelling, SOB or lightheadedness with hypotension: No Has patient had a PCN reaction causing severe rash involving mucus membranes or skin necrosis: No Has patient had a PCN reaction that required hospitalization: No Has patient had a PCN reaction occurring within the last 10 years: No If all of the above answers are "NO", then may proceed with Cephalosporin use.      Current Outpatient Medications  Medication Sig Dispense Refill   acetaminophen (TYLENOL) 325 MG tablet Take 2 tablets (650 mg total) by  mouth every 6 (six) hours as needed for mild pain (or Fever >/= 101). (Patient taking differently: Take 650 mg by mouth every 6 (six) hours as needed for mild pain or headache (or Fever >/= 101).) 30 tablet 0   albuterol (VENTOLIN HFA) 108 (90 Base) MCG/ACT inhaler Inhale 1-2 puffs into the lungs every 6 (six) hours as needed for wheezing or shortness of breath.     allopurinol (ZYLOPRIM) 100 MG tablet Take 1 tablet (100 mg total) by mouth daily. 30 tablet 1   amiodarone (PACERONE) 200 MG tablet Take 1 tablet (200 mg total) by mouth daily. 10 tablet 3   atorvastatin (LIPITOR) 40 MG tablet TAKE 1 TABLET BY MOUTH  DAILY 90 tablet 3   AURYXIA 1 GM 210 MG(Fe) tablet Take 210 mg by mouth daily.     B Complex-C-Zn-Folic  Acid (DIALYVITE 800-ZINC 15) 0.8 MG TABS Take 1 tablet by mouth daily.     cephALEXin (KEFLEX) 500 MG capsule Take 1 capsule (500 mg total) by mouth 3 (three) times daily. (Patient not taking: Reported on 08/16/2021) 21 capsule 0   clopidogrel (PLAVIX) 75 MG tablet TAKE 1 TABLET BY MOUTH  DAILY WITH BREAKFAST 90 tablet 3   ELIQUIS 2.5 MG TABS tablet TAKE 1 TABLET BY MOUTH  TWICE DAILY 180 tablet 3   HYDROcodone-acetaminophen (NORCO) 5-325 MG tablet Take 1 tablet by mouth every 6 (six) hours as needed for moderate pain. 30 tablet 0   HYDROcodone-acetaminophen (NORCO/VICODIN) 5-325 MG tablet Take 1 tablet by mouth every 4 (four) hours as needed for moderate pain. (Patient not taking: Reported on 11/23/2021) 10 tablet 0   levocetirizine (XYZAL) 5 MG tablet Take 5 mg by mouth daily.     levothyroxine (SYNTHROID, LEVOTHROID) 175 MCG tablet Take 175 mcg by mouth daily before breakfast.      lidocaine-prilocaine (EMLA) cream Apply 1 application topically Every Tuesday,Thursday,and Saturday with dialysis.     losartan (COZAAR) 25 MG tablet Take 1.5 tablets (37.5 mg total) by mouth daily. 135 tablet 3   metoprolol succinate (TOPROL-XL) 25 MG 24 hr tablet Take 0.5 tablets (12.5 mg total) by mouth daily. 45 tablet 3   omeprazole (PRILOSEC) 40 MG capsule Take 1 capsule (40 mg total) by mouth in the morning and at bedtime. (Patient taking differently: Take 40 mg by mouth daily.) 60 capsule 5   pramipexole (MIRAPEX) 0.25 MG tablet Take 0.25 mg by mouth at bedtime. (Patient not taking: Reported on 12/07/2021)     VELPHORO 500 MG chewable tablet Chew by mouth.     No current facility-administered medications for this visit.     Past Surgical History:  Procedure Laterality Date   ABDOMINAL AORTIC ANEURYSM REPAIR  2006   AV FISTULA PLACEMENT Left 04/25/2019   Procedure: ARTERIOVENOUS (AV) FISTULA CREATION LEFT ARM;  Surgeon: Angelia Mould, MD;  Location: [facility];  Service: Vascular;  Laterality: Left;   AV  FISTULA PLACEMENT Left 05/19/2019   Procedure: CONVERSION OF LEFT ARM ARTERIOVENOUS FISTULA TO GRAFT;  Surgeon: Angelia Mould, MD;  Location: [facility];  Service: Vascular;  Laterality: Left;   BIOPSY  09/30/2018   Procedure: BIOPSY;  Surgeon: Danie Binder, MD;  [facility];  Service: Endoscopy;;  ascending colon   BIOPSY  07/27/2020   Procedure: BIOPSY;  Surgeon: Eloise Harman, DO;  [facility];  Service: Endoscopy;;  gastric   BUBBLE STUDY  09/23/2020   Procedure: BUBBLE STUDY;  Surgeon: Skeet Latch, MD;  Location: [facility];  Service: Cardiovascular;;   CARDIOVERSION N/A 09/23/2020   Procedure: CARDIOVERSION;  Surgeon: Skeet Latch, MD;  Location: [facility];  Service: Cardiovascular;  Laterality: N/A;   COLONOSCOPY  2008   COLONOSCOPY N/A 09/30/2018   External and internal hemorrhoids, six polyps removed, one ascending colon polypoid lesion biopsied. Six simple adenomas and one benign polypoid lesion. Colonoscopy Nov 2022.    COLONOSCOPY WITH PROPOFOL N/A 07/27/2020   non-bleeding internal hemorrhoids, sigmoid and descending colon diverticulosis, four 1-2 mm polyps in ascending colon, one 5 mm polyp in transverse colon. 3 year surveillance. Tubular adenomas.    CORONARY ANGIOPLASTY WITH STENT PLACEMENT  1997   MID CIRCUMFLEX   CORONARY STENT INTERVENTION N/A 09/20/2020   Procedure: CORONARY STENT INTERVENTION;  Surgeon: Martinique, Peter M, MD;  Location: [facility];  Service: Cardiovascular;  Laterality: N/A;   CYSTOSCOPY N/A 08/29/2021   Procedure: CYSTOSCOPY;  Surgeon: Cleon Gustin, MD;  [facility];  Service: Urology;  Laterality: N/A;   CYSTOSCOPY W/ RETROGRADES Bilateral 03/28/2021   Procedure: CYSTOSCOPY WITH RETROGRADE PYELOGRAM;  Surgeon: Cleon Gustin, MD;  Location: [facility];  Service: Urology;  Laterality: Bilateral;   ESOPHAGOGASTRODUODENOSCOPY (EGD) WITH PROPOFOL N/A 07/27/2020   Food in middle third of  esophagus, gastritis s/p biopsy, nodular mucosa in lesser curvature of stomach s/p biopsy. Negative H.pylori.    HIP ARTHROPLASTY Right 04/30/2020   Procedure: ARTHROPLASTY  HIP (HEMIARTHROPLASTY);  Surgeon: Altamese , MD;  Location: [facility];  Service: Orthopedics;  Laterality: Right;   INTRAVASCULAR ULTRASOUND/IVUS N/A 09/20/2020   Procedure: Intravascular Ultrasound/IVUS;  Surgeon: Martinique, Peter M, MD;  Location: [facility];  Service: Cardiovascular;  Laterality: N/A;   IR FLUORO GUIDE CV LINE RIGHT  04/22/2019   IR FLUORO GUIDE CV LINE RIGHT  05/01/2020   IR THORACENTESIS ASP PLEURAL SPACE W/IMG GUIDE  09/22/2020   IR THROMBECTOMY AV FISTULA W/THROMBOLYSIS/PTA INC/SHUNT/IMG LEFT Left 05/03/2020   IR US GUIDE VASC ACCESS LEFT  05/03/2020   IR US GUIDE VASC ACCESS RIGHT  04/22/2019   IR US GUIDE VASC ACCESS RIGHT  05/01/2020   JOINT REPLACEMENT     Right   POLYPECTOMY  09/30/2018   Procedure: POLYPECTOMY;  Surgeon: Danie Binder, MD;  [facility];  Service: Endoscopy;;  colon   POLYPECTOMY  07/27/2020   Procedure: POLYPECTOMY;  Surgeon: Eloise Harman, DO;  [facility];  Service: Endoscopy;;   REVISION OF ARTERIOVENOUS GORETEX GRAFT Left 08/18/2021   Procedure: REVISION OF LEFT ARTERIOVENOUS GORETEX GRAFT;  Surgeon: Serafina Mitchell, MD;  [facility];  Service: Vascular;  Laterality: Left;  PERIPHERAL NERVE BLOCK   RIGHT/LEFT HEART CATH AND CORONARY ANGIOGRAPHY N/A 09/20/2020   Procedure: RIGHT/LEFT HEART CATH AND CORONARY ANGIOGRAPHY;  Surgeon: Martinique, Peter M, MD;  Location: [facility];  Service: Cardiovascular;  Laterality: N/A;   TEE WITHOUT CARDIOVERSION N/A 09/23/2020   Procedure: TRANSESOPHAGEAL ECHOCARDIOGRAM (TEE);  Surgeon: Skeet Latch, MD;  Location: [facility];  Service: Cardiovascular;  Laterality: N/A;   TRANSURETHRAL RESECTION OF BLADDER TUMOR N/A 03/28/2021   Procedure: TRANSURETHRAL RESECTION OF BLADDER TUMOR  (TURBT);  Surgeon: Cleon Gustin, MD;  [facility];  Service: Urology;  Laterality: N/A;   TRANSURETHRAL RESECTION OF BLADDER TUMOR N/A 08/29/2021   Procedure: TRANSURETHRAL RESECTION OF BLADDER TUMOR (TURBT);  Surgeon: Cleon Gustin, MD;  [facility];  Service: Urology;  Laterality: N/A;   VIDEO BRONCHOSCOPY WITH ENDOBRONCHIAL NAVIGATION  N/A 01/27/2019   Procedure: VIDEO BRONCHOSCOPY WITH ENDOBRONCHIAL NAVIGATION;  Surgeon: Grace Isaac, MD;  Location: [facility];  Service: Thoracic;  Laterality: N/A;   VIDEO BRONCHOSCOPY WITH ENDOBRONCHIAL ULTRASOUND N/A 01/27/2019   Procedure: VIDEO BRONCHOSCOPY WITH ENDOBRONCHIAL ULTRASOUND;  Surgeon: Grace Isaac, MD;  Location: [facility];  Service: Thoracic;  Laterality: N/A;     Allergies  Allergen Reactions   Advair Hfa [Fluticasone-Salmeterol] Other (See Comments)    Developed thrush, although the mouth WAS being rinsed as directed   Penicillins Rash    Has patient had a PCN reaction causing immediate rash, facial/tongue/throat swelling, SOB or lightheadedness with hypotension: No Has patient had a PCN reaction causing severe rash involving mucus membranes or skin necrosis: No Has patient had a PCN reaction that required hospitalization: No Has patient had a PCN reaction occurring within the last 10 years: No If all of the above answers are "NO", then may proceed with Cephalosporin use.       Family History  Problem Relation Age of Onset   Stroke Brother    Lung cancer Sister 34       lung cancer/former   Colon cancer Neg Hx    Colon polyps Neg Hx      Social History Mr. Lefeber reports that he quit smoking about 4 years ago. His smoking use included cigarettes. He has a 27.00 pack-year smoking history. He has never used smokeless tobacco. Mr. Peretti reports that he does not currently use alcohol.   Review of Systems CONSTITUTIONAL: No weight loss, fever, chills, weakness or fatigue.  HEENT: Eyes: No visual  loss, blurred vision, double vision or yellow sclerae.No hearing loss, sneezing, congestion, runny nose or sore throat.  SKIN: No rash or itching.  CARDIOVASCULAR: per hpi RESPIRATORY: No shortness of breath, cough or sputum.  GASTROINTESTINAL: No anorexia, nausea, vomiting or diarrhea. No abdominal pain or blood.  GENITOURINARY: No burning on urination, no polyuria NEUROLOGICAL: No headache, dizziness, syncope, paralysis, ataxia, numbness or tingling in the extremities. No change in bowel or bladder control.  MUSCULOSKELETAL: No muscle, back pain, joint pain or stiffness.  LYMPHATICS: No enlarged nodes. No history of splenectomy.  PSYCHIATRIC: No history of depression or anxiety.  ENDOCRINOLOGIC: No reports of sweating, cold or heat intolerance. No polyuria or polydipsia.  [name]   Physical Examination Today's Vitals   12/09/21 0841  BP: (!) 178/80  Pulse: 65  SpO2: 97%  Weight: 159 lb (72.1 kg)  Height: 5\' 9"  (1.753 m)   Body mass index is 23.48 kg/m.  Gen: resting comfortably, no acute distress HEENT: no scleral icterus, pupils equal round and reactive, no palptable cervical adenopathy,  CV: RRR, no m/r/g no jvd Resp: Clear to auscultation bilaterally GI: abdomen is soft, non-tender, non-distended, normal bowel sounds, no hepatosplenomegaly MSK: extremities are warm, no edema.  Skin: warm, no rash Neuro:  no focal deficits Psych: appropriate affect   Diagnostic Studies  LHC 09/20/2020 Prox LAD to Mid LAD lesion is 40% stenosed. Mid Cx lesion is 50% stenosed. Mid Cx to Dist Cx lesion is 90% stenosed. Prox Cx lesion is 50% stenosed. RV [place] Nelms lesion is 50% stenosed. A drug-eluting stent was successfully placed using a STENT RESOLUTE ONYX 3.0X34. Post intervention, there is a 0% residual stenosis. Post intervention, there is a 0% residual stenosis. Post intervention, there is a 0% residual stenosis. A drug-eluting stent was successfully placed using a STENT RESOLUTE ONYX  4.0X18. LV end diastolic pressure is normal. Hemodynamic findings consistent with  mild pulmonary hypertension.   1. Left dominant circulation 2. Severe single vessel obstructive CAD involving the proximal to mid LCx 3. Normal LV filling pressures 4. Mild pulmonary HTN 5. Normal cardiac output 6. Successful PCI of the proximal to mid LCx with DES x 2 overlapping. Lesion modified with cutting balloon angioplasty and Shockwave therapy.    Plan: DAPT for at least one year. Optimize medical therapy for CHF.    TTE 09/17/2020    1. Left ventricular ejection fraction, by estimation, is 15-20%. The left  ventricle has severely decreased function. The left ventricle demonstrates  global hypokinesis. The left ventricular internal cavity size was mildly  dilated. Left ventricular  diastolic parameters are consistent with Grade III diastolic dysfunction  (restrictive). Elevated left atrial pressure.   2. Right ventricular systolic function is mildly reduced. The right  ventricular size is normal.   3. Left atrial size was severely dilated.   4. Right atrial size was severely dilated.   5. Large pleural effusion in the left lateral region.   6. The mitral valve is normal in structure. Mild mitral valve  regurgitation. No evidence of mitral stenosis.   7. The aortic valve is tricuspid. Aortic valve regurgitation is not  visualized. No aortic stenosis is present.   8. The inferior vena cava is dilated in size with >50% respiratory  variability, suggesting right atrial pressure of 8 mmHg.    Assessment and Plan   1. CAD - denies any symptoms, over a year since his PCI, can d/c plavix   2. Chronic systolic HF - euvolemic, volume status controlled with HD -medical therapy has been historically limited by low bp's on HD which seemed to have resolved - will d/c losartan, start entresto 49/51mg  bid and monitor.    3. Aflutter - no symptoms, continue current meds - eliquis dosing should be  5mg  bid, will increase  4. HTN - elevated today, chagning from losartan to entresto 49/51mg  bid   F/u 4 months  Arnoldo Lenis, M.D.

## 2021-12-10 DIAGNOSIS — T82398A Other mechanical complication of other vascular grafts, initial encounter: Secondary | ICD-10-CM | POA: Diagnosis not present

## 2021-12-10 DIAGNOSIS — E039 Hypothyroidism, unspecified: Secondary | ICD-10-CM | POA: Diagnosis not present

## 2021-12-10 DIAGNOSIS — D631 Anemia in chronic kidney disease: Secondary | ICD-10-CM | POA: Diagnosis not present

## 2021-12-10 DIAGNOSIS — L0889 Other specified local infections of the skin and subcutaneous tissue: Secondary | ICD-10-CM | POA: Diagnosis not present

## 2021-12-10 DIAGNOSIS — N2581 Secondary hyperparathyroidism of renal origin: Secondary | ICD-10-CM | POA: Diagnosis not present

## 2021-12-10 DIAGNOSIS — Z992 Dependence on renal dialysis: Secondary | ICD-10-CM | POA: Diagnosis not present

## 2021-12-10 DIAGNOSIS — N186 End stage renal disease: Secondary | ICD-10-CM | POA: Diagnosis not present

## 2021-12-10 DIAGNOSIS — D689 Coagulation defect, unspecified: Secondary | ICD-10-CM | POA: Diagnosis not present

## 2021-12-10 DIAGNOSIS — L089 Local infection of the skin and subcutaneous tissue, unspecified: Secondary | ICD-10-CM | POA: Diagnosis not present

## 2021-12-13 DIAGNOSIS — Z992 Dependence on renal dialysis: Secondary | ICD-10-CM | POA: Diagnosis not present

## 2021-12-13 DIAGNOSIS — D631 Anemia in chronic kidney disease: Secondary | ICD-10-CM | POA: Diagnosis not present

## 2021-12-13 DIAGNOSIS — N186 End stage renal disease: Secondary | ICD-10-CM | POA: Diagnosis not present

## 2021-12-13 DIAGNOSIS — D689 Coagulation defect, unspecified: Secondary | ICD-10-CM | POA: Diagnosis not present

## 2021-12-13 DIAGNOSIS — L089 Local infection of the skin and subcutaneous tissue, unspecified: Secondary | ICD-10-CM | POA: Diagnosis not present

## 2021-12-13 DIAGNOSIS — E039 Hypothyroidism, unspecified: Secondary | ICD-10-CM | POA: Diagnosis not present

## 2021-12-13 DIAGNOSIS — L0889 Other specified local infections of the skin and subcutaneous tissue: Secondary | ICD-10-CM | POA: Diagnosis not present

## 2021-12-13 DIAGNOSIS — I129 Hypertensive chronic kidney disease with stage 1 through stage 4 chronic kidney disease, or unspecified chronic kidney disease: Secondary | ICD-10-CM | POA: Diagnosis not present

## 2021-12-13 DIAGNOSIS — N2581 Secondary hyperparathyroidism of renal origin: Secondary | ICD-10-CM | POA: Diagnosis not present

## 2021-12-13 DIAGNOSIS — T82398A Other mechanical complication of other vascular grafts, initial encounter: Secondary | ICD-10-CM | POA: Diagnosis not present

## 2021-12-14 ENCOUNTER — Other Ambulatory Visit: Payer: Self-pay

## 2021-12-14 ENCOUNTER — Ambulatory Visit (INDEPENDENT_AMBULATORY_CARE_PROVIDER_SITE_OTHER): Payer: Medicare Other | Admitting: Physician Assistant

## 2021-12-14 VITALS — BP 147/71 | HR 69 | Temp 98.3°F | Resp 20 | Ht 69.0 in | Wt 158.8 lb

## 2021-12-14 DIAGNOSIS — Z992 Dependence on renal dialysis: Secondary | ICD-10-CM | POA: Diagnosis not present

## 2021-12-14 DIAGNOSIS — N186 End stage renal disease: Secondary | ICD-10-CM | POA: Diagnosis not present

## 2021-12-14 NOTE — Progress Notes (Signed)
° ° °  Postoperative Access Visit   History of Present Illness   Jeremy Johnson is a 76 y.o. year old male who presents for postoperative follow-up for: excision of left upper arm dialysis graft ulcer with graft revision 08/18/21 by Dr. Trula Slade. He returns today with his wife for incision check. He was here 3 weeks ago with some oozing and superficial ulcer. No frank bleeding.  He reports today that the patient's wounds are well healed.  The patient notes no steal symptoms. His graft has been working well. He has had some oozing still usually at dialysis. He has not had any at home. No further issues with access trouble.    He is on Dialysis T/T/S at [facility] in [place]  Dialysis access history: -left BC AVF 04/25/2019 by Dr. Scot Dock -LUA AVG 05/19/2019 by Dr. Scot Dock -excision of LUA AVG with revision 08/18/2021 by Dr. Trula Slade  Physical Examination   Vitals:   12/14/21 0909  BP: (!) 147/71  Pulse: 69  Resp: 20  Temp: 98.3 F (36.8 C)  TempSrc: Temporal  SpO2: 97%  Weight: 158 lb 12.8 oz (72 kg)  Height: 5\' 9"  (1.753 m)   Body mass index is 23.45 kg/m.  left arm Incision is well healed, 2+ radial pulse, hand grip is 5/5, sensation in digits is  intact, palpable thrill, bruit can  be auscultated       No active bleeding. Appears to just be some needle holes. Superficial ulceration has improved  Medical Decision Making   Jeremy Johnson is a 76 y.o. year old male who presents s/p excision of left upper arm dialysis graft ulcer with graft revision 08/18/21 by Dr. Trula Slade. The superficial area of ulceration is healing. Only some needle holes present. No active bleeding. No concern for skin breakdown.  Patent is without signs or symptoms of steal syndrome Patient and his wife will continue to monitor area for further bleeding issues. The patient may follow up on a prn basis   Karoline Caldwell, PA-C [facility] Office:  [facility] MD: Yetta Barre

## 2021-12-15 DIAGNOSIS — Z992 Dependence on renal dialysis: Secondary | ICD-10-CM | POA: Diagnosis not present

## 2021-12-15 DIAGNOSIS — D631 Anemia in chronic kidney disease: Secondary | ICD-10-CM | POA: Diagnosis not present

## 2021-12-15 DIAGNOSIS — E876 Hypokalemia: Secondary | ICD-10-CM | POA: Diagnosis not present

## 2021-12-15 DIAGNOSIS — N186 End stage renal disease: Secondary | ICD-10-CM | POA: Diagnosis not present

## 2021-12-15 DIAGNOSIS — D689 Coagulation defect, unspecified: Secondary | ICD-10-CM | POA: Diagnosis not present

## 2021-12-15 DIAGNOSIS — N2581 Secondary hyperparathyroidism of renal origin: Secondary | ICD-10-CM | POA: Diagnosis not present

## 2021-12-17 DIAGNOSIS — Z992 Dependence on renal dialysis: Secondary | ICD-10-CM | POA: Diagnosis not present

## 2021-12-17 DIAGNOSIS — E876 Hypokalemia: Secondary | ICD-10-CM | POA: Diagnosis not present

## 2021-12-17 DIAGNOSIS — D689 Coagulation defect, unspecified: Secondary | ICD-10-CM | POA: Diagnosis not present

## 2021-12-17 DIAGNOSIS — N186 End stage renal disease: Secondary | ICD-10-CM | POA: Diagnosis not present

## 2021-12-17 DIAGNOSIS — D631 Anemia in chronic kidney disease: Secondary | ICD-10-CM | POA: Diagnosis not present

## 2021-12-17 DIAGNOSIS — N2581 Secondary hyperparathyroidism of renal origin: Secondary | ICD-10-CM | POA: Diagnosis not present

## 2021-12-20 DIAGNOSIS — D631 Anemia in chronic kidney disease: Secondary | ICD-10-CM | POA: Diagnosis not present

## 2021-12-20 DIAGNOSIS — E876 Hypokalemia: Secondary | ICD-10-CM | POA: Diagnosis not present

## 2021-12-20 DIAGNOSIS — N186 End stage renal disease: Secondary | ICD-10-CM | POA: Diagnosis not present

## 2021-12-20 DIAGNOSIS — N2581 Secondary hyperparathyroidism of renal origin: Secondary | ICD-10-CM | POA: Diagnosis not present

## 2021-12-20 DIAGNOSIS — D689 Coagulation defect, unspecified: Secondary | ICD-10-CM | POA: Diagnosis not present

## 2021-12-20 DIAGNOSIS — Z992 Dependence on renal dialysis: Secondary | ICD-10-CM | POA: Diagnosis not present

## 2021-12-22 DIAGNOSIS — D689 Coagulation defect, unspecified: Secondary | ICD-10-CM | POA: Diagnosis not present

## 2021-12-22 DIAGNOSIS — N2581 Secondary hyperparathyroidism of renal origin: Secondary | ICD-10-CM | POA: Diagnosis not present

## 2021-12-22 DIAGNOSIS — D631 Anemia in chronic kidney disease: Secondary | ICD-10-CM | POA: Diagnosis not present

## 2021-12-22 DIAGNOSIS — E876 Hypokalemia: Secondary | ICD-10-CM | POA: Diagnosis not present

## 2021-12-22 DIAGNOSIS — Z992 Dependence on renal dialysis: Secondary | ICD-10-CM | POA: Diagnosis not present

## 2021-12-22 DIAGNOSIS — N186 End stage renal disease: Secondary | ICD-10-CM | POA: Diagnosis not present

## 2021-12-23 ENCOUNTER — Other Ambulatory Visit: Payer: Self-pay | Admitting: Cardiology

## 2021-12-24 DIAGNOSIS — N186 End stage renal disease: Secondary | ICD-10-CM | POA: Diagnosis not present

## 2021-12-24 DIAGNOSIS — D631 Anemia in chronic kidney disease: Secondary | ICD-10-CM | POA: Diagnosis not present

## 2021-12-24 DIAGNOSIS — E876 Hypokalemia: Secondary | ICD-10-CM | POA: Diagnosis not present

## 2021-12-24 DIAGNOSIS — D689 Coagulation defect, unspecified: Secondary | ICD-10-CM | POA: Diagnosis not present

## 2021-12-24 DIAGNOSIS — Z992 Dependence on renal dialysis: Secondary | ICD-10-CM | POA: Diagnosis not present

## 2021-12-24 DIAGNOSIS — N2581 Secondary hyperparathyroidism of renal origin: Secondary | ICD-10-CM | POA: Diagnosis not present

## 2021-12-27 DIAGNOSIS — N186 End stage renal disease: Secondary | ICD-10-CM | POA: Diagnosis not present

## 2021-12-27 DIAGNOSIS — N2581 Secondary hyperparathyroidism of renal origin: Secondary | ICD-10-CM | POA: Diagnosis not present

## 2021-12-27 DIAGNOSIS — Z992 Dependence on renal dialysis: Secondary | ICD-10-CM | POA: Diagnosis not present

## 2021-12-27 DIAGNOSIS — D689 Coagulation defect, unspecified: Secondary | ICD-10-CM | POA: Diagnosis not present

## 2021-12-27 DIAGNOSIS — E876 Hypokalemia: Secondary | ICD-10-CM | POA: Diagnosis not present

## 2021-12-27 DIAGNOSIS — D631 Anemia in chronic kidney disease: Secondary | ICD-10-CM | POA: Diagnosis not present

## 2021-12-29 DIAGNOSIS — Z992 Dependence on renal dialysis: Secondary | ICD-10-CM | POA: Diagnosis not present

## 2021-12-29 DIAGNOSIS — E876 Hypokalemia: Secondary | ICD-10-CM | POA: Diagnosis not present

## 2021-12-29 DIAGNOSIS — D631 Anemia in chronic kidney disease: Secondary | ICD-10-CM | POA: Diagnosis not present

## 2021-12-29 DIAGNOSIS — D689 Coagulation defect, unspecified: Secondary | ICD-10-CM | POA: Diagnosis not present

## 2021-12-29 DIAGNOSIS — N2581 Secondary hyperparathyroidism of renal origin: Secondary | ICD-10-CM | POA: Diagnosis not present

## 2021-12-29 DIAGNOSIS — N186 End stage renal disease: Secondary | ICD-10-CM | POA: Diagnosis not present

## 2021-12-30 ENCOUNTER — Other Ambulatory Visit: Payer: Self-pay

## 2021-12-30 ENCOUNTER — Ambulatory Visit (INDEPENDENT_AMBULATORY_CARE_PROVIDER_SITE_OTHER): Payer: Medicare Other | Admitting: Physician Assistant

## 2021-12-30 VITALS — BP 164/82 | HR 71 | Temp 97.6°F | Resp 20 | Ht 69.0 in | Wt 158.5 lb

## 2021-12-30 DIAGNOSIS — Z992 Dependence on renal dialysis: Secondary | ICD-10-CM

## 2021-12-30 DIAGNOSIS — N186 End stage renal disease: Secondary | ICD-10-CM

## 2021-12-30 NOTE — Progress Notes (Signed)
POST OPERATIVE OFFICE NOTE    CC:  F/u for surgery  HPI:  This is a 76 y.o. male who is s/p  revision left UE AV graft with ulcer on 08/18/21 by Dr. Dr. Trula Slade.  He has had a return of the ulcered area.  He states the HD techs have not used this area for hd.  It has formed another scabed area that is riased.  It has drained/bleed as recent as 1 week ago.    Pt returns today for follow up.  Pt states he has not had fevers or chills and the graft is still functioning.  Allergies  Allergen Reactions   Advair Hfa [Fluticasone-Salmeterol] Other (See Comments)    Developed thrush, although the mouth WAS being rinsed as directed   Penicillins Rash    Has patient had a PCN reaction causing immediate rash, facial/tongue/throat swelling, SOB or lightheadedness with hypotension: No Has patient had a PCN reaction causing severe rash involving mucus membranes or skin necrosis: No Has patient had a PCN reaction that required hospitalization: No Has patient had a PCN reaction occurring within the last 10 years: No If all of the above answers are "NO", then may proceed with Cephalosporin use.     Current Outpatient Medications  Medication Sig Dispense Refill   acetaminophen (TYLENOL) 325 MG tablet Take 2 tablets (650 mg total) by mouth every 6 (six) hours as needed for mild pain (or Fever >/= 101). (Patient taking differently: Take 650 mg by mouth every 6 (six) hours as needed for mild pain or headache (or Fever >/= 101).) 30 tablet 0   allopurinol (ZYLOPRIM) 100 MG tablet Take 1 tablet (100 mg total) by mouth daily. 30 tablet 1   amiodarone (PACERONE) 200 MG tablet TAKE 1 TABLET BY MOUTH  DAILY 90 tablet 3   apixaban (ELIQUIS) 5 MG TABS tablet Take 1 tablet (5 mg total) by mouth 2 (two) times daily. 60 tablet 5   atorvastatin (LIPITOR) 40 MG tablet TAKE 1 TABLET BY MOUTH  DAILY 90 tablet 3   AURYXIA 1 GM 210 MG(Fe) tablet Take 210 mg by mouth daily.     B Complex-C-Zn-Folic Acid (DIALYVITE 948-NIOE  15) 0.8 MG TABS Take 1 tablet by mouth daily.     cephALEXin (KEFLEX) 500 MG capsule Take 1 capsule (500 mg total) by mouth 3 (three) times daily. 21 capsule 0   levocetirizine (XYZAL) 5 MG tablet Take 5 mg by mouth daily.     levothyroxine (SYNTHROID, LEVOTHROID) 175 MCG tablet Take 175 mcg by mouth daily before breakfast.      lidocaine-prilocaine (EMLA) cream Apply 1 application topically Every Tuesday,Thursday,and Saturday with dialysis.     metoprolol succinate (TOPROL-XL) 25 MG 24 hr tablet Take 0.5 tablets (12.5 mg total) by mouth daily. 45 tablet 3   omeprazole (PRILOSEC) 40 MG capsule Take 1 capsule (40 mg total) by mouth in the morning and at bedtime. (Patient taking differently: Take 40 mg by mouth daily.) 60 capsule 5   pramipexole (MIRAPEX) 0.25 MG tablet Take 0.25 mg by mouth at bedtime.     sacubitril-valsartan (ENTRESTO) 49-51 MG Take 1 tablet by mouth 2 (two) times daily. 60 tablet 5   VELPHORO 500 MG chewable tablet Chew by mouth.     No current facility-administered medications for this visit.     ROS:  See HPI  Physical Exam:     Incision:  the scab is raised and tender to palpation.  There is no erythema or  surrounding edema. Extremities:  palpable trill, grip 5/5, and sensation is intact lungs: non labored breathing Heart:  RRR   Assessment/Plan:  This is a 76 y.o. male who is s/p:revision left AV graft due to ulcer now with new ulcer and episodes of bleeding.  He states are not using this area and is frustrated that the ulcer has returned.  They are concerned that it will bleed again.  He will need another revision verse new graft placement.    The graft may be used above the ulcer.  If the ulcer bleeds he will apply direct pressure and proceed to the ED at [facility].   I will discuss the plan with Dr. Trula Slade and we will call him early next week with plan.        Roxy Horseman PA-C [facility] [phone]   Clinic MD:  Virl Cagey

## 2021-12-31 DIAGNOSIS — D689 Coagulation defect, unspecified: Secondary | ICD-10-CM | POA: Diagnosis not present

## 2021-12-31 DIAGNOSIS — E876 Hypokalemia: Secondary | ICD-10-CM | POA: Diagnosis not present

## 2021-12-31 DIAGNOSIS — D631 Anemia in chronic kidney disease: Secondary | ICD-10-CM | POA: Diagnosis not present

## 2021-12-31 DIAGNOSIS — N2581 Secondary hyperparathyroidism of renal origin: Secondary | ICD-10-CM | POA: Diagnosis not present

## 2021-12-31 DIAGNOSIS — Z992 Dependence on renal dialysis: Secondary | ICD-10-CM | POA: Diagnosis not present

## 2021-12-31 DIAGNOSIS — N186 End stage renal disease: Secondary | ICD-10-CM | POA: Diagnosis not present

## 2022-01-02 ENCOUNTER — Other Ambulatory Visit: Payer: Self-pay

## 2022-01-03 ENCOUNTER — Encounter (HOSPITAL_COMMUNITY)
Admission: EM | Disposition: A | Discharge status: Home / Self Care | Payer: Self-pay | Source: Home / Self Care | Attending: Emergency Medicine

## 2022-01-03 ENCOUNTER — Emergency Department (EMERGENCY_DEPARTMENT_HOSPITAL): Payer: Medicare Other | Admitting: Anesthesiology

## 2022-01-03 ENCOUNTER — Encounter (HOSPITAL_COMMUNITY): Payer: Self-pay | Admitting: Emergency Medicine

## 2022-01-03 ENCOUNTER — Emergency Department (HOSPITAL_COMMUNITY): Payer: Medicare Other

## 2022-01-03 ENCOUNTER — Ambulatory Visit (HOSPITAL_COMMUNITY)
Admission: EM | Admit: 2022-01-03 | Discharge: 2022-01-03 | Disposition: A | Discharge status: Home / Self Care | Payer: Medicare Other | Attending: Emergency Medicine | Admitting: Emergency Medicine

## 2022-01-03 ENCOUNTER — Emergency Department (HOSPITAL_COMMUNITY): Payer: Medicare Other | Admitting: Anesthesiology

## 2022-01-03 ENCOUNTER — Other Ambulatory Visit: Payer: Self-pay

## 2022-01-03 ENCOUNTER — Inpatient Hospital Stay: Admit: 2022-01-03 | Payer: Medicare Other | Admitting: Vascular Surgery

## 2022-01-03 DIAGNOSIS — I252 Old myocardial infarction: Secondary | ICD-10-CM | POA: Diagnosis not present

## 2022-01-03 DIAGNOSIS — I2510 Atherosclerotic heart disease of native coronary artery without angina pectoris: Secondary | ICD-10-CM

## 2022-01-03 DIAGNOSIS — T82838A Hemorrhage of vascular prosthetic devices, implants and grafts, initial encounter: Secondary | ICD-10-CM | POA: Diagnosis not present

## 2022-01-03 DIAGNOSIS — I132 Hypertensive heart and chronic kidney disease with heart failure and with stage 5 chronic kidney disease, or end stage renal disease: Secondary | ICD-10-CM

## 2022-01-03 DIAGNOSIS — T82898A Other specified complication of vascular prosthetic devices, implants and grafts, initial encounter: Secondary | ICD-10-CM | POA: Insufficient documentation

## 2022-01-03 DIAGNOSIS — Z992 Dependence on renal dialysis: Secondary | ICD-10-CM | POA: Insufficient documentation

## 2022-01-03 DIAGNOSIS — Y841 Kidney dialysis as the cause of abnormal reaction of the patient, or of later complication, without mention of misadventure at the time of the procedure: Secondary | ICD-10-CM | POA: Insufficient documentation

## 2022-01-03 DIAGNOSIS — N186 End stage renal disease: Secondary | ICD-10-CM | POA: Diagnosis not present

## 2022-01-03 DIAGNOSIS — Z20822 Contact with and (suspected) exposure to covid-19: Secondary | ICD-10-CM | POA: Insufficient documentation

## 2022-01-03 DIAGNOSIS — I120 Hypertensive chronic kidney disease with stage 5 chronic kidney disease or end stage renal disease: Secondary | ICD-10-CM | POA: Diagnosis not present

## 2022-01-03 DIAGNOSIS — J90 Pleural effusion, not elsewhere classified: Secondary | ICD-10-CM | POA: Diagnosis not present

## 2022-01-03 DIAGNOSIS — I509 Heart failure, unspecified: Secondary | ICD-10-CM | POA: Diagnosis not present

## 2022-01-03 DIAGNOSIS — J811 Chronic pulmonary edema: Secondary | ICD-10-CM | POA: Diagnosis not present

## 2022-01-03 DIAGNOSIS — I517 Cardiomegaly: Secondary | ICD-10-CM | POA: Diagnosis not present

## 2022-01-03 DIAGNOSIS — R6889 Other general symptoms and signs: Secondary | ICD-10-CM | POA: Diagnosis not present

## 2022-01-03 DIAGNOSIS — D631 Anemia in chronic kidney disease: Secondary | ICD-10-CM | POA: Diagnosis not present

## 2022-01-03 DIAGNOSIS — Z9889 Other specified postprocedural states: Secondary | ICD-10-CM

## 2022-01-03 DIAGNOSIS — I1311 Hypertensive heart and chronic kidney disease without heart failure, with stage 5 chronic kidney disease, or end stage renal disease: Secondary | ICD-10-CM | POA: Diagnosis not present

## 2022-01-03 DIAGNOSIS — Z87891 Personal history of nicotine dependence: Secondary | ICD-10-CM | POA: Diagnosis not present

## 2022-01-03 DIAGNOSIS — T829XXA Unspecified complication of cardiac and vascular prosthetic device, implant and graft, initial encounter: Secondary | ICD-10-CM

## 2022-01-03 DIAGNOSIS — N250 Renal osteodystrophy: Secondary | ICD-10-CM | POA: Diagnosis not present

## 2022-01-03 DIAGNOSIS — Z743 Need for continuous supervision: Secondary | ICD-10-CM | POA: Diagnosis not present

## 2022-01-03 DIAGNOSIS — R001 Bradycardia, unspecified: Secondary | ICD-10-CM | POA: Diagnosis not present

## 2022-01-03 DIAGNOSIS — R58 Hemorrhage, not elsewhere classified: Secondary | ICD-10-CM | POA: Diagnosis not present

## 2022-01-03 DIAGNOSIS — I499 Cardiac arrhythmia, unspecified: Secondary | ICD-10-CM | POA: Diagnosis not present

## 2022-01-03 HISTORY — PX: INSERTION OF DIALYSIS CATHETER: SHX1324

## 2022-01-03 HISTORY — PX: AV FISTULA PLACEMENT: SHX1204

## 2022-01-03 LAB — CBC WITH DIFFERENTIAL/PLATELET
Abs Immature Granulocytes: 0.02 10*3/uL (ref 0.00–0.07)
Basophils Absolute: 0.1 10*3/uL (ref 0.0–0.1)
Basophils Relative: 1 %
Eosinophils Absolute: 0.2 10*3/uL (ref 0.0–0.5)
Eosinophils Relative: 3 %
HCT: 27 % — ABNORMAL LOW (ref 39.0–52.0)
Hemoglobin: 9.1 g/dL — ABNORMAL LOW (ref 13.0–17.0)
Immature Granulocytes: 0 %
Lymphocytes Relative: 34 %
Lymphs Abs: 2.5 10*3/uL (ref 0.7–4.0)
MCH: 33.6 pg (ref 26.0–34.0)
MCHC: 33.7 g/dL (ref 30.0–36.0)
MCV: 99.6 fL (ref 80.0–100.0)
Monocytes Absolute: 0.9 10*3/uL (ref 0.1–1.0)
Monocytes Relative: 12 %
Neutro Abs: 3.6 10*3/uL (ref 1.7–7.7)
Neutrophils Relative %: 50 %
Platelets: 177 10*3/uL (ref 150–400)
RBC: 2.71 MIL/uL — ABNORMAL LOW (ref 4.22–5.81)
RDW: 15.1 % (ref 11.5–15.5)
WBC: 7.3 10*3/uL (ref 4.0–10.5)
nRBC: 0 % (ref 0.0–0.2)

## 2022-01-03 LAB — BASIC METABOLIC PANEL
Anion gap: 11 (ref 5–15)
BUN: 39 mg/dL — ABNORMAL HIGH (ref 8–23)
CO2: 30 mmol/L (ref 22–32)
Calcium: 8.4 mg/dL — ABNORMAL LOW (ref 8.9–10.3)
Chloride: 96 mmol/L — ABNORMAL LOW (ref 98–111)
Creatinine, Ser: 9.67 mg/dL — ABNORMAL HIGH (ref 0.61–1.24)
GFR, Estimated: 5 mL/min — ABNORMAL LOW (ref >60)
Glucose, Bld: 102 mg/dL — ABNORMAL HIGH (ref 70–99)
Potassium: 3.2 mmol/L — ABNORMAL LOW (ref 3.5–5.1)
Sodium: 137 mmol/L (ref 135–145)

## 2022-01-03 LAB — PROTIME-INR
INR: 1.3 — ABNORMAL HIGH (ref 0.8–1.2)
Prothrombin Time: 16.1 seconds — ABNORMAL HIGH (ref 11.4–15.2)

## 2022-01-03 LAB — RESP PANEL BY RT-PCR (FLU A&B, COVID) ARPGX2
Influenza A by PCR: NEGATIVE
Influenza B by PCR: NEGATIVE
SARS Coronavirus 2 by RT PCR: NEGATIVE

## 2022-01-03 SURGERY — INSERTION OF ARTERIOVENOUS (AV) GORE-TEX GRAFT ARM
Anesthesia: General | Site: Chest | Laterality: Right

## 2022-01-03 MED ORDER — LIDOCAINE-EPINEPHRINE (PF) 1 %-1:200000 IJ SOLN
INTRAMUSCULAR | Status: DC | PRN
  Administered 2022-01-03: 10 mL

## 2022-01-03 MED ORDER — FENTANYL CITRATE (PF) 250 MCG/5ML IJ SOLN
INTRAMUSCULAR | Status: DC | PRN
  Administered 2022-01-03: 50 ug via INTRAVENOUS
  Administered 2022-01-03: 25 ug via INTRAVENOUS
  Administered 2022-01-03: 50 ug via INTRAVENOUS
  Administered 2022-01-03: 25 ug via INTRAVENOUS

## 2022-01-03 MED ORDER — FENTANYL CITRATE (PF) 250 MCG/5ML IJ SOLN
INTRAMUSCULAR | Status: AC
  Filled 2022-01-03: qty 5

## 2022-01-03 MED ORDER — VANCOMYCIN HCL IN DEXTROSE 1-5 GM/200ML-% IV SOLN
1000.0000 mg | Freq: Once | INTRAVENOUS | Status: AC
  Administered 2022-01-03: 1000 mg via INTRAVENOUS

## 2022-01-03 MED ORDER — HEMOSTATIC AGENTS (NO CHARGE) OPTIME
TOPICAL | Status: DC | PRN
Start: 2022-01-03 — End: 2022-01-03
  Administered 2022-01-03: 1 via TOPICAL

## 2022-01-03 MED ORDER — HEPARIN 6000 UNIT IRRIGATION SOLUTION
Status: DC | PRN
  Administered 2022-01-03: 1

## 2022-01-03 MED ORDER — PROPOFOL 10 MG/ML IV BOLUS
INTRAVENOUS | Status: DC | PRN
  Administered 2022-01-03: 30 mg via INTRAVENOUS
  Administered 2022-01-03: 40 mg via INTRAVENOUS
  Administered 2022-01-03: 80 mg via INTRAVENOUS

## 2022-01-03 MED ORDER — PROPOFOL 10 MG/ML IV BOLUS
INTRAVENOUS | Status: AC
  Filled 2022-01-03: qty 20

## 2022-01-03 MED ORDER — LIDOCAINE 2% (20 MG/ML) 5 ML SYRINGE
INTRAMUSCULAR | Status: DC | PRN
  Administered 2022-01-03: 100 mg via INTRAVENOUS

## 2022-01-03 MED ORDER — 0.9 % SODIUM CHLORIDE (POUR BTL) OPTIME
TOPICAL | Status: DC | PRN
  Administered 2022-01-03: 1000 mL

## 2022-01-03 MED ORDER — HEPARIN SODIUM (PORCINE) 1000 UNIT/ML IJ SOLN
INTRAMUSCULAR | Status: DC | PRN
Start: 2022-01-03 — End: 2022-01-03
  Administered 2022-01-03: 3200 [IU] via INTRAVENOUS

## 2022-01-03 MED ORDER — HEPARIN SODIUM (PORCINE) 1000 UNIT/ML IJ SOLN
INTRAMUSCULAR | Status: AC
  Filled 2022-01-03: qty 10

## 2022-01-03 MED ORDER — EPHEDRINE SULFATE-NACL 50-0.9 MG/10ML-% IV SOSY
PREFILLED_SYRINGE | INTRAVENOUS | Status: DC | PRN
  Administered 2022-01-03: 10 mg via INTRAVENOUS
  Administered 2022-01-03 (×2): 5 mg via INTRAVENOUS

## 2022-01-03 MED ORDER — ONDANSETRON HCL 4 MG/2ML IJ SOLN
INTRAMUSCULAR | Status: DC | PRN
  Administered 2022-01-03: 4 mg via INTRAVENOUS

## 2022-01-03 MED ORDER — ORAL CARE MOUTH RINSE: 15.0000 mL | Freq: Once | OROMUCOSAL | Status: AC

## 2022-01-03 MED ORDER — CHLORHEXIDINE GLUCONATE 0.12 % MT SOLN
OROMUCOSAL | Status: AC
  Filled 2022-01-03: qty 15

## 2022-01-03 MED ORDER — PHENYLEPHRINE HCL-NACL 20-0.9 MG/250ML-% IV SOLN
INTRAVENOUS | Status: DC | PRN
  Administered 2022-01-03: 30 ug/min via INTRAVENOUS

## 2022-01-03 MED ORDER — CHLORHEXIDINE GLUCONATE 0.12 % MT SOLN
15.0000 mL | Freq: Once | OROMUCOSAL | Status: AC
  Administered 2022-01-03: 15 mL via OROMUCOSAL

## 2022-01-03 MED ORDER — SODIUM CHLORIDE 0.9 % IV SOLN: INTRAVENOUS | Status: DC

## 2022-01-03 MED ORDER — DEXAMETHASONE SODIUM PHOSPHATE 10 MG/ML IJ SOLN
INTRAMUSCULAR | Status: DC | PRN
  Administered 2022-01-03: 5 mg via INTRAVENOUS

## 2022-01-03 MED ORDER — HEPARIN 6000 UNIT IRRIGATION SOLUTION
Status: AC
  Filled 2022-01-03: qty 500

## 2022-01-03 MED ORDER — VANCOMYCIN HCL IN DEXTROSE 1-5 GM/200ML-% IV SOLN
INTRAVENOUS | Status: AC
  Filled 2022-01-03: qty 200

## 2022-01-03 MED ORDER — HYDROCODONE-ACETAMINOPHEN 5-325 MG PO TABS: 1.0000 | ORAL_TABLET | Freq: Four times a day (QID) | ORAL | 0 refills | Status: AC | PRN

## 2022-01-03 SURGICAL SUPPLY — 49 items
ADH SKN CLS APL DERMABOND .7 (GAUZE/BANDAGES/DRESSINGS) ×4
ARMBAND PINK RESTRICT EXTREMIT (MISCELLANEOUS) ×6 IMPLANT
BAG COUNTER SPONGE SURGICOUNT (BAG) ×3 IMPLANT
BAG SPNG CNTER NS LX DISP (BAG) ×2
BIOPATCH RED 1 DISK 7.0 (GAUZE/BANDAGES/DRESSINGS) ×1 IMPLANT
BNDG ELASTIC 4X5.8 VLCR STR LF (GAUZE/BANDAGES/DRESSINGS) ×1 IMPLANT
CANISTER SUCT 3000ML PPV (MISCELLANEOUS) ×3 IMPLANT
CATH PALINDROME-P 19CM W/VT (CATHETERS) ×1 IMPLANT
CLIP LIGATING EXTRA MED SLVR (CLIP) ×3 IMPLANT
CLIP LIGATING EXTRA SM BLUE (MISCELLANEOUS) ×3 IMPLANT
COVER SURGICAL LIGHT HANDLE (MISCELLANEOUS) ×1 IMPLANT
DERMABOND ADVANCED (GAUZE/BANDAGES/DRESSINGS) ×2
DERMABOND ADVANCED .7 DNX12 (GAUZE/BANDAGES/DRESSINGS) ×2 IMPLANT
DRAPE C-ARM 42X120 X-RAY (DRAPES) ×1 IMPLANT
DRAPE CHEST BREAST 15X10 FENES (DRAPES) ×1 IMPLANT
DRAPE ORTHO SPLIT 77X108 STRL (DRAPES) ×3
DRAPE SURG ORHT 6 SPLT 77X108 (DRAPES) IMPLANT
DRSG COVADERM 4X6 (GAUZE/BANDAGES/DRESSINGS) ×1 IMPLANT
ELECT REM PT RETURN 9FT ADLT (ELECTROSURGICAL) ×3
ELECTRODE REM PT RTRN 9FT ADLT (ELECTROSURGICAL) ×2 IMPLANT
GAUZE 4X4 16PLY ~~LOC~~+RFID DBL (SPONGE) ×1 IMPLANT
GAUZE SPONGE 4X4 12PLY STRL (GAUZE/BANDAGES/DRESSINGS) ×1 IMPLANT
GAUZE SPONGE 4X4 12PLY STRL LF (GAUZE/BANDAGES/DRESSINGS) ×1 IMPLANT
GLOVE SURG ENC MOIS LTX SZ7.5 (GLOVE) ×3 IMPLANT
GOWN STRL REUS W/ TWL LRG LVL3 (GOWN DISPOSABLE) ×4 IMPLANT
GOWN STRL REUS W/ TWL XL LVL3 (GOWN DISPOSABLE) ×2 IMPLANT
GOWN STRL REUS W/TWL LRG LVL3 (GOWN DISPOSABLE) ×6
GOWN STRL REUS W/TWL XL LVL3 (GOWN DISPOSABLE) ×6
GRAFT GORETEX STRT 4-7X45 (Vascular Products) ×1 IMPLANT
HEMOSTAT SNOW SURGICEL 2X4 (HEMOSTASIS) IMPLANT
INSERT FOGARTY SM (MISCELLANEOUS) ×3 IMPLANT
KIT BASIN OR (CUSTOM PROCEDURE TRAY) ×3 IMPLANT
KIT TURNOVER KIT B (KITS) ×3 IMPLANT
NDL HYPO 25GX1X1/2 BEV (NEEDLE) IMPLANT
NEEDLE HYPO 25GX1X1/2 BEV (NEEDLE) ×3 IMPLANT
NS IRRIG 1000ML POUR BTL (IV SOLUTION) ×3 IMPLANT
PACK CV ACCESS (CUSTOM PROCEDURE TRAY) ×3 IMPLANT
PAD ARMBOARD 7.5X6 YLW CONV (MISCELLANEOUS) ×6 IMPLANT
POWDER SURGICEL 3.0 GRAM (HEMOSTASIS) ×1 IMPLANT
SUT ETHILON 3 0 PS 1 (SUTURE) ×1 IMPLANT
SUT MNCRL AB 4-0 PS2 18 (SUTURE) ×4 IMPLANT
SUT PROLENE 6 0 BV (SUTURE) ×2 IMPLANT
SUT SILK 2 0 SH (SUTURE) IMPLANT
SUT VIC AB 3-0 SH 27 (SUTURE) ×9
SUT VIC AB 3-0 SH 27X BRD (SUTURE) ×4 IMPLANT
SYR TOOMEY 50ML (SYRINGE) IMPLANT
TOWEL GREEN STERILE (TOWEL DISPOSABLE) ×3 IMPLANT
UNDERPAD 30X36 HEAVY ABSORB (UNDERPADS AND DIAPERS) ×3 IMPLANT
WATER STERILE IRR 1000ML POUR (IV SOLUTION) ×3 IMPLANT

## 2022-01-03 NOTE — Transfer of Care (Signed)
Immediate Anesthesia Transfer of Care Note  Patient: JAYCE KAINZ  Procedure(s) Performed: Left arm arteriovenous graft revision with tunneled dialysis catheter (Left)  Patient Location: PACU  Anesthesia Type:General  Level of Consciousness: drowsy  Airway & Oxygen Therapy: Patient Spontanous Breathing and Patient connected to face mask oxygen  Post-op Assessment: Report given to RN and Post -op Vital signs reviewed and stable  Post vital signs: Reviewed and stable  Last Vitals:  Vitals Value Taken Time  BP 136/59 01/03/22 1317  Temp    Pulse 58 01/03/22 1318  Resp 8 01/03/22 1318  SpO2 98 % 01/03/22 1318  Vitals shown include unvalidated device data.  Last Pain:  Vitals:   01/03/22 1110  TempSrc: Oral  PainSc: 0-No pain         Complications: No notable events documented.

## 2022-01-03 NOTE — Op Note (Signed)
Patient name: Jeremy Johnson MRN: [id number] DOB: 1946-10-07 Sex: male  01/03/2022 Pre-operative Diagnosis: End-stage renal disease, bleeding left arm AV graft Post-operative diagnosis:  Same Surgeon:  Eda Paschal. Donzetta Matters, MD Assistant: Laurence Slate, PA Procedure Performed: 1.  Revision of left upper arm AV graft with interposition 7 mm PTFE graft 2.  Excision of left upper arm ulcerated graft 3.  Placement of right IJ 19 cm tunneled dialysis catheter with ultrasound and fluoroscopic guidance   Indications: 76 year old male with history of end-stage renal disease dialyzing via left upper arm AV graft.  He has had an area of ulceration that was repaired approximately 4 months ago and now is bleeding again requiring trip to the emergency department this morning.  He is now indicated for revision and placement of tunneled dialysis catheter.  An assistant was necessary to facilitate exposure and expedite the case.  Findings: Left upper arm graft had a very strong thrill.  We placed an interposition graft around the area of concern.  The graft was then subtotally excised leaving only approximately 3 cm of the more cephalad aspect of the graft and we remove the entire ulcerated area.  All the wounds were closed.  Right IJ was large and patent and compressible.  19 cm tunneled dialysis catheter was placed to the SVC atrial junction.   Procedure:  The patient was identified in the holding area and taken to the operating where he was sterilely prepped and draped in the bilateral neck and chest and left upper extremity usual fashion, antibiotics were administered and timeout was called.  We first made incisions to open up his previous primary graft incisions.  We dissected down to the graft and exposed around the entirety of the graft.  We then clamped the graft in both incisions towards the axilla first and then towards the brachial artery.  The graft was transected near the antecubitum flushed with  heparinized saline distally and reclamped in the axillary incision.  A new graft was then tunneled laterally around the ulcerated area.  This was a 4-7 mm PTFE graft but we used only the 7 mm area.  We sewed it in place and an end to end fashion first near the brachial artery anastomosis.  After we did this we then flushed it with heparinized saline retrograde and reclamped the graft.  We then turned our attention distally.  The graft was transected leaving the ulcerated segment completely unattached.  New graft was then trimmed to size and again sewn end to end to the existing graft.  Prior completion we flushed with heparinized saline.  Upon completion there was a very strong thrill in the graft.  We then turned our attention towards the ulcerated area.  Elliptical type incision was made around the ulcer.  We dissected down to the graft.  We are able to dissected the entire graft out towards the brachial artery and removed entirely.  Towards the axilla it was going to be more dangerous given the patient had recently had Eliquis and thus we excised it from underlying the 2 incisions of the wound in the middle arm and the axillary incision and then left approximately 3 cm of graft.  All the wounds were irrigated and hemostasis obtained and they were closed in layers with Vicryl and Monocryl.  Dermabond was placed at the level the skin.  A loose Ace wrap was then placed given the patient was on Eliquis prior to this procedure.  Attention was then turned to  the catheter portion of the case.  The right IJ was identified noted be patent and compressible.  There is anesthetized 1% lidocaine cannulated with 18-gauge needle followed by wire placed centrally.  A 19 cm catheter was then tunneled through a counterincision.  The wire tract was serially dilated and introducer sheath was placed under fluoroscopic guidance.  The catheter was placed to the SVC atrial junction flushed with heparinized saline.  Was affixed to the  skin with 3-0 nylon suture in the neck access site was closed with 4 Monocryl and Dermabond is placed at both sites.  The catheter was locked with 1.6 cc of concentrated heparin.  Sterile dressings were placed on the neck and chest.  He was then awakened from anesthesia having tolerated procedure without immediate complication.  All counts were correct at completion.  EBL: 100 cc   Daegan Arizmendi C. Donzetta Matters, MD [facility] Office: [phone] Pager: [phone]

## 2022-01-03 NOTE — H&P (Signed)
H+P  History of Present Illness: This is a 76 y.o. male history of end-stage renal disease currently dialyzing via left arm AV graft.  This was revised in October of last year.  He had an ulcerated area there which has now recurred.  He was seen in our office 4 days ago and was scheduled for elective intervention and he has been holding his Eliquis since yesterday for that.  He states that he started bleeding this morning presented to the [facility] emergency department where a pressure dressing was placed they were able to get hemostasis.  Is not having any current pain.  Last meal at 430 this morning was a sausage biscuit.  Past Medical History:  Diagnosis Date   AAA (abdominal aortic aneurysm)    3.2 cm by 2020 CT; recheck in 3 years recommended   Anemia    Blood transfusion without reported diagnosis    CAD (coronary artery disease)    Stent to [place]; DES x2 to CX 2021   Chronic kidney disease    ESRD on HD   COPD (chronic obstructive pulmonary disease) (HCC)    Degenerative joint disease (DJD) of lumbar spine    GERD (gastroesophageal reflux disease)    Gout    Hyperlipidemia    Hypertension    Hypothyroidism    Incisional hernia    abdomen   Leukocytosis    CHRONIC MILD   Myocardial infarction ([place])    1997   SCL CA dx'd 01/2019   Lung cancer   Thoracic aortic aneurysm    4.5cm aortic arch by 07/20/21 CT    Past Surgical History:  Procedure Laterality Date   ABDOMINAL AORTIC ANEURYSM REPAIR  2006   AV FISTULA PLACEMENT Left 04/25/2019   Procedure: ARTERIOVENOUS (AV) FISTULA CREATION LEFT ARM;  Surgeon: Angelia Mould, MD;  Location: [place];  Service: Vascular;  Laterality: Left;   AV FISTULA PLACEMENT Left 05/19/2019   Procedure: CONVERSION OF LEFT ARM ARTERIOVENOUS FISTULA TO GRAFT;  Surgeon: Angelia Mould, MD;  Location: [place];  Service: Vascular;  Laterality: Left;   BIOPSY  09/30/2018   Procedure: BIOPSY;  Surgeon: Danie Binder, MD;  [facility];  Service: Endoscopy;;  ascending colon   BIOPSY  07/27/2020   Procedure: BIOPSY;  Surgeon: Eloise Harman, DO;  [facility];  Service: Endoscopy;;  gastric   BUBBLE STUDY  09/23/2020   Procedure: BUBBLE STUDY;  Surgeon: Skeet Latch, MD;  Location: [place];  Service: Cardiovascular;;   CARDIOVERSION N/A 09/23/2020   Procedure: CARDIOVERSION;  Surgeon: Skeet Latch, MD;  Location: [place];  Service: Cardiovascular;  Laterality: N/A;   COLONOSCOPY  2008   COLONOSCOPY N/A 09/30/2018   External and internal hemorrhoids, six polyps removed, one ascending colon polypoid lesion biopsied. Six simple adenomas and one benign polypoid lesion. Colonoscopy Nov 2022.    COLONOSCOPY WITH PROPOFOL N/A 07/27/2020   non-bleeding internal hemorrhoids, sigmoid and descending colon diverticulosis, four 1-2 mm polyps in ascending colon, one 5 mm polyp in transverse colon. 3 year surveillance. Tubular adenomas.    CORONARY ANGIOPLASTY WITH STENT PLACEMENT  1997   MID CIRCUMFLEX   CORONARY STENT INTERVENTION N/A 09/20/2020   Procedure: CORONARY STENT INTERVENTION;  Surgeon: Martinique, Peter M, MD;  Location: [place] CV LAB;  Service: Cardiovascular;  Laterality: N/A;   CYSTOSCOPY N/A 08/29/2021   Procedure: CYSTOSCOPY;  Surgeon: Cleon Gustin, MD;  Location: [facility];  Service: Urology;  Laterality:  N/A;   CYSTOSCOPY W/ RETROGRADES Bilateral 03/28/2021   Procedure: CYSTOSCOPY WITH RETROGRADE PYELOGRAM;  Surgeon: Cleon Gustin, MD;  Location: [facility];  Service: Urology;  Laterality: Bilateral;   ESOPHAGOGASTRODUODENOSCOPY (EGD) WITH PROPOFOL N/A 07/27/2020   Food in middle third of esophagus, gastritis s/p biopsy, nodular mucosa in lesser curvature of stomach s/p biopsy. Negative H.pylori.    HIP ARTHROPLASTY Right 04/30/2020   Procedure: ARTHROPLASTY  HIP (HEMIARTHROPLASTY);  Surgeon: Altamese [place], MD;  Location: [place];  Service: Orthopedics;  Laterality:  Right;   INTRAVASCULAR ULTRASOUND/IVUS N/A 09/20/2020   Procedure: Intravascular Ultrasound/IVUS;  Surgeon: Martinique, Peter M, MD;  Location: [place] CV LAB;  Service: Cardiovascular;  Laterality: N/A;   IR FLUORO GUIDE CV LINE RIGHT  04/22/2019   IR FLUORO GUIDE CV LINE RIGHT  05/01/2020   IR THORACENTESIS ASP PLEURAL SPACE W/IMG GUIDE  09/22/2020   IR THROMBECTOMY AV FISTULA W/THROMBOLYSIS/PTA INC/SHUNT/IMG LEFT Left 05/03/2020   IR US GUIDE VASC ACCESS LEFT  05/03/2020   IR US GUIDE VASC ACCESS RIGHT  04/22/2019   IR US GUIDE VASC ACCESS RIGHT  05/01/2020   JOINT REPLACEMENT     Right   POLYPECTOMY  09/30/2018   Procedure: POLYPECTOMY;  Surgeon: Danie Binder, MD;  [facility];  Service: Endoscopy;;  colon   POLYPECTOMY  07/27/2020   Procedure: POLYPECTOMY;  Surgeon: Eloise Harman, DO;  [facility];  Service: Endoscopy;;   REVISION OF ARTERIOVENOUS GORETEX GRAFT Left 08/18/2021   Procedure: REVISION OF LEFT ARTERIOVENOUS GORETEX GRAFT;  Surgeon: Serafina Mitchell, MD;  [facility];  Service: Vascular;  Laterality: Left;  PERIPHERAL NERVE BLOCK   RIGHT/LEFT HEART CATH AND CORONARY ANGIOGRAPHY N/A 09/20/2020   Procedure: RIGHT/LEFT HEART CATH AND CORONARY ANGIOGRAPHY;  Surgeon: Martinique, Peter M, MD;  Location: [place] CV [facility];  Service: Cardiovascular;  Laterality: N/A;   TEE WITHOUT CARDIOVERSION N/A 09/23/2020   Procedure: TRANSESOPHAGEAL ECHOCARDIOGRAM (TEE);  Surgeon: Skeet Latch, MD;  Location: [place];  Service: Cardiovascular;  Laterality: N/A;   TRANSURETHRAL RESECTION OF BLADDER TUMOR N/A 03/28/2021   Procedure: TRANSURETHRAL RESECTION OF BLADDER TUMOR (TURBT);  Surgeon: Cleon Gustin, MD;  [facility];  Service: Urology;  Laterality: N/A;   TRANSURETHRAL RESECTION OF BLADDER TUMOR N/A 08/29/2021   Procedure: TRANSURETHRAL RESECTION OF BLADDER TUMOR (TURBT);  Surgeon: Cleon Gustin, MD;  [facility];  Service:  Urology;  Laterality: N/A;   VIDEO BRONCHOSCOPY WITH ENDOBRONCHIAL NAVIGATION N/A 01/27/2019   Procedure: VIDEO BRONCHOSCOPY WITH ENDOBRONCHIAL NAVIGATION;  Surgeon: Grace Isaac, MD;  Location: [place];  Service: Thoracic;  Laterality: N/A;   VIDEO BRONCHOSCOPY WITH ENDOBRONCHIAL ULTRASOUND N/A 01/27/2019   Procedure: VIDEO BRONCHOSCOPY WITH ENDOBRONCHIAL ULTRASOUND;  Surgeon: Grace Isaac, MD;  Location: [place];  Service: Thoracic;  Laterality: N/A;    Allergies  Allergen Reactions   Advair Hfa [Fluticasone-Salmeterol] Other (See Comments)    Developed thrush, although the mouth WAS being rinsed as directed   Penicillins Rash    Has patient had a PCN reaction causing immediate rash, facial/tongue/throat swelling, SOB or lightheadedness with hypotension: No Has patient had a PCN reaction causing severe rash involving mucus membranes or skin necrosis: No Has patient had a PCN reaction that required hospitalization: No Has patient had a PCN reaction occurring within the last 10 years: No If all of the above answers are "NO", then may proceed with Cephalosporin use.     Prior to Admission medications  Medication Sig Start Date End Date Taking? Authorizing Provider  acetaminophen (TYLENOL) 325 MG tablet Take 2 tablets (650 mg total) by mouth every 6 (six) hours as needed for mild pain (or Fever >/= 101). Patient taking differently: Take 650 mg by mouth every 6 (six) hours as needed for mild pain or headache (or Fever >/= 101). 05/25/19  Yes Swayze, Ava, DO  allopurinol (ZYLOPRIM) 100 MG tablet Take 1 tablet (100 mg total) by mouth daily. 04/26/19  Yes Mercy Riding, MD  amiodarone (PACERONE) 200 MG tablet TAKE 1 TABLET BY MOUTH  DAILY Patient taking differently: Take 200 mg by mouth daily. 12/23/21  Yes Branch, Alphonse Guild, MD  atorvastatin (LIPITOR) 40 MG tablet TAKE 1 TABLET BY MOUTH  DAILY Patient taking differently: Take 40 mg by mouth daily. 11/08/21  Yes Branch, Alphonse Guild, MD   B Complex-C-Zn-Folic Acid (DIALYVITE 161-WRUE 15) 0.8 MG TABS Take 1 tablet by mouth daily. 04/22/20  Yes [provider]  levothyroxine (SYNTHROID, LEVOTHROID) 175 MCG tablet Take 175 mcg by mouth daily before breakfast.    Yes [provider]  lidocaine-prilocaine (EMLA) cream Apply 1 application topically Every Tuesday,Thursday,and Saturday with dialysis. 08/28/20  Yes [provider]  metoprolol succinate (TOPROL-XL) 25 MG 24 hr tablet Take 0.5 tablets (12.5 mg total) by mouth daily. 02/16/21  Yes BranchAlphonse Guild, MD  omeprazole (PRILOSEC) 40 MG capsule Take 1 capsule (40 mg total) by mouth in the morning and at bedtime. Patient taking differently: Take 40 mg by mouth daily. 07/27/20 01/03/22 Yes Carver, Charles K, DO  sacubitril-valsartan (ENTRESTO) 49-51 MG Take 1 tablet by mouth 2 (two) times daily. 12/09/21  Yes Branch, Alphonse Guild, MD  apixaban (ELIQUIS) 5 MG TABS tablet Take 1 tablet (5 mg total) by mouth 2 (two) times daily. 12/09/21   Arnoldo Lenis, MD    Social History   Socioeconomic History   Marital status: Married    Spouse name: Not on file   Number of children: 2   Years of education: Not on file   Highest education level: Not on file  Occupational History    Comment: retired  Tobacco Use   Smoking status: Former    Packs/day: 0.50    Years: 54.00    Pack years: 27.00    Types: Cigarettes    Quit date: 09/17/2017    Years since quitting: 4.2    Passive exposure: Never   Smokeless tobacco: Never  Vaping Use   Vaping Use: Never used  Substance and Sexual Activity   Alcohol use: Not Currently    Comment: occasional   Drug use: No   Sexual activity: Not Currently  Other Topics Concern   Not on file  Social History Narrative   Not on file   Social Determinants of Health   Financial Resource Strain: Not on file  Food Insecurity: Not on file  Transportation Needs: Not on file  Physical Activity: Not on file  Stress: Not on file   Social Connections: Not on file  Intimate Partner Violence: Not on file     Family History  Problem Relation Age of Onset   Stroke Brother    Lung cancer Sister 76       lung cancer/former   Colon cancer Neg Hx    Colon polyps Neg Hx     ROS: Bleeding left arm AV graft   Physical Examination  Vitals:   01/03/22 0900 01/03/22 1003  BP: (!) 164/77 (!) 175/77  Pulse: 66 68  Resp:  20  Temp:  97.7 F (36.5 C)  SpO2: (!) 88% 96%   Body mass index is 23.41 kg/m.  Awake alert oriented Left upper arm dressing clean dry intact without any active bleeding There is a palpable thrill within the graft I did not take the dressing down to evaluate the ulcerated area Palpable left radial pulse  CBC    Component Value Date/Time   WBC 7.3 01/03/2022 0614   RBC 2.71 (L) 01/03/2022 0614   HGB 9.1 (L) 01/03/2022 0614   HGB 13.6 07/20/2021 0911   HCT 27.0 (L) 01/03/2022 0614   HCT 17.5 (LL) 05/23/2019 0746   PLT 177 01/03/2022 0614   PLT 202 07/20/2021 0911   MCV 99.6 01/03/2022 0614   MCH 33.6 01/03/2022 0614   MCHC 33.7 01/03/2022 0614   RDW 15.1 01/03/2022 0614   LYMPHSABS 2.5 01/03/2022 0614   MONOABS 0.9 01/03/2022 0614   EOSABS 0.2 01/03/2022 0614   BASOSABS 0.1 01/03/2022 0614    BMET    Component Value Date/Time   NA 137 01/03/2022 0614   K 3.2 (L) 01/03/2022 0614   CL 96 (L) 01/03/2022 0614   CO2 30 01/03/2022 0614   GLUCOSE 102 (H) 01/03/2022 0614   BUN 39 (H) 01/03/2022 0614   CREATININE 9.67 (H) 01/03/2022 0614   CREATININE 7.18 (HH) 07/20/2021 0911   CALCIUM 8.4 (L) 01/03/2022 0614   GFRNONAA 5 (L) 01/03/2022 0614   GFRNONAA 7 (L) 07/20/2021 0911   GFRAA 18 (L) 07/20/2020 1058    COAGS: Lab Results  Component Value Date   INR 1.3 (H) 01/03/2022   INR 2.0 (H) 09/22/2020   INR 1.4 (H) 09/20/2020     Non-Invasive Vascular Imaging:   No studies   ASSESSMENT/PLAN: This is a 76 y.o. male with bleeding ulceration left arm AV graft.  I discussed  the options with the patient we have decided to revise the left arm AV graft and place a tunneled dialysis catheter.  Nephrology input appreciated we will plan for outpatient dialysis tomorrow.  I discussed the risk benefits alternatives with the patient and plan to proceed today.  Dezarai Prew C. Donzetta Matters, MD [facility] [place] Office: [phone] Pager: [phone]

## 2022-01-03 NOTE — Anesthesia Preprocedure Evaluation (Addendum)
Anesthesia Evaluation  Patient identified by MRN, date of birth, ID band Patient awake    Reviewed: Allergy & Precautions, NPO status , Patient's Chart, lab work & pertinent test results  History of Anesthesia Complications Negative for: history of anesthetic complications  Airway Mallampati: I  TM Distance: >3 FB Neck ROM: Full    Dental  (+) Dental Advisory Given, Missing,  [place] :   Pulmonary shortness of breath and with exertion, COPD, former smoker,  Small cell lung cancer   Pulmonary exam normal breath sounds clear to auscultation       Cardiovascular hypertension, Pt. on medications + CAD, + Past MI, + Cardiac Stents, + Peripheral Vascular Disease (AAA repair) and +CHF (EF 25 - 30%)  + dysrhythmias Atrial Fibrillation  Rhythm:Regular Rate:Normal + Systolic murmurs ECHO 2/35 Left Ventricle: Left ventricular ejection fraction, by estimation, is 25  to 30%. The left ventricle has severely decreased function. The left  ventricle demonstrates global hypokinesis. The left ventricular internal  cavity size was moderately to severely  dilated. There is borderline left ventricular hypertrophy. Left  ventricular diastolic parameters are indeterminate.    Neuro/Psych negative neurological ROS  negative psych ROS   GI/Hepatic GERD  Medicated and Controlled,  Endo/Other  Hypothyroidism   Renal/GU ESRF and DialysisRenal disease Bladder dysfunction (bladder rumor)      Musculoskeletal  (+) Arthritis , Osteoarthritis,    Abdominal   Peds  Hematology  (+) Blood dyscrasia, anemia ,   Anesthesia Other Findings    Reproductive/Obstetrics                          Anesthesia Physical  Anesthesia Plan  ASA: 4 and emergent  Anesthesia Plan: General   Post-op Pain Management:    Induction: Intravenous  PONV Risk Score and Plan: 4 or greater and Ondansetron and Dexamethasone  Airway  Management Planned: LMA  Additional Equipment: None  Intra-op Plan:   Post-operative Plan: Extubation in OR  Informed Consent: I have reviewed the patients History and Physical, chart, labs and discussed the procedure including the risks, benefits and alternatives for the proposed anesthesia with the patient or authorized representative who has indicated his/her understanding and acceptance.     Dental advisory given  Plan Discussed with: CRNA, Surgeon and Anesthesiologist  Anesthesia Plan Comments: Jeremy Johnson is an 76 y.o. male.  HPI: Pt with HTN, COPD, CAD, AAA and history of lung CA and also ESRD-  TTS at [facility]. He had been having an issue with an AVG ulcer- was actually scheduled to have graft revision on 2/23 by [facility].   He presented to dialysis today and promptly has an brisk acute access bleed so was sent to ER-  Pt tells me the plan is for him to go to [facility] and have said revision and likely [facility] placement later this AM.  He is due for HD today but numbers look OK -  no acute need for HD.  He had full treatment on Saturday )       Anesthesia Quick Evaluation

## 2022-01-03 NOTE — ED Provider Notes (Signed)
Care assumed from previous provider.  Patient transferred from [facility] for vascular surgery consultation.  See previous provider notes for full details.  In short, patient is a 76 year old male who presents to the ED due to bleeding from left upper extremity dialysis fistula.  History of same.  Has a revision scheduled for 2/23.  Previous provider spoke to vascular surgery who recommended transfer to [facility]. Physical Exam  BP (!) 164/77    Pulse 66    Temp 98.7 F (37.1 C) (Oral)    Resp 18    Ht 5\' 9"  (1.753 m)    Wt 71.9 kg    SpO2 (!) 88%    BMI 23.41 kg/m   Physical Exam Vitals and nursing note reviewed.  Constitutional:      General: He is not in acute distress.    Appearance: He is not ill-appearing.  HENT:     Head: Normocephalic.  Eyes:     Pupils: Pupils are equal, round, and reactive to light.  Cardiovascular:     Rate and Rhythm: Normal rate and regular rhythm.     Pulses: Normal pulses.     Heart sounds: Normal heart sounds. No murmur heard.   No friction rub. No gallop.  Pulmonary:     Effort: Pulmonary effort is normal.     Breath sounds: Normal breath sounds.  Abdominal:     General: Abdomen is flat. There is no distension.     Palpations: Abdomen is soft.     Tenderness: There is no abdominal tenderness. There is no guarding or rebound.  Musculoskeletal:        General: Normal range of motion.     Cervical back: Neck supple.     Comments: LUE neurovascularly intact  Skin:    General: Skin is warm and dry.     Comments: Left AV fistula bandaged.   Neurological:     General: No focal deficit present.     Mental Status: He is alert.  Psychiatric:        Mood and Affect: Mood normal.        Behavior: Behavior normal.    Procedures  Procedures  ED Course / MDM    Medical Decision Making Risk Decision regarding hospitalization. Emergency major surgery.  76 year old male presents to the ED as a transfer from [facility] due to AV fistula bleeding.   Previous provider discussed with vascular surgery who recommended transfer to [facility] for evaluation.  Patient currently on Eliquis.  10:14 AM assessed patient at bedside. Dr. Donzetta Matters with vascular at bedside. Plan to take patient to the OR. If OR goes well, most likely will be discharged after. Keep NPO. Dr. Donzetta Matters asks no one remove the bandage until he is taken to the OR. Last took Eliquis yesterday morning. Had a sausage biscuit around 4:30AM this morning. COVID negative.      Suzy Bouchard, PA-C 01/03/22 [place], [place], DO 01/03/22 1530

## 2022-01-03 NOTE — Progress Notes (Signed)
Spoke with Dr. Donzetta Matters regarding patient's procedure on 01/03/22.  He no longer will need surgery on 01/05/22 (Identical postings/surgeries).  Dr. Donzetta Matters stated he would let his office know to cancel the surgery scheduled for 01/05/22.

## 2022-01-03 NOTE — ED Provider Notes (Signed)
[facility] EMERGENCY DEPARTMENT Provider Note   CSN: [id number] Arrival date & time: 01/03/22  0551     History  Chief Complaint  Patient presents with   Fistula bleeding    Jeremy Johnson is a 76 y.o. male.  HPI     This is a 76 year old male with a history of end-stage renal disease on dialysis Tuesday, Thursday, Saturday who presents with a bleeding left upper extremity dialysis fistula.  Patient has a history of the same.  Has had 1 fistula revision.  At that time had ulceration through to the skin.  He has since developed another ulcer and scabbing.  He has had some intermittent bleeding.  He was seen and evaluated in vascular surgery clinic on 2/17.  Plan for revision surgery on 2/23.  This morning patient presented to his dialysis center.  Upon presentation, he had spontaneous bleeding from fistula site at the ulcer.  Site had not been accessed yet.  EMS reports approximately a pint of blood at the scene.  Compressive dressing was applied.  Patient did have a full dialysis session on Saturday.  Patient stopped taking his Eliquis yesterday in anticipation of surgery.  Home Medications Prior to Admission medications   Medication Sig Start Date End Date Taking? Authorizing Provider  acetaminophen (TYLENOL) 325 MG tablet Take 2 tablets (650 mg total) by mouth every 6 (six) hours as needed for mild pain (or Fever >/= 101). Patient taking differently: Take 650 mg by mouth every 6 (six) hours as needed for mild pain or headache (or Fever >/= 101). 05/25/19   Swayze, Ava, DO  allopurinol (ZYLOPRIM) 100 MG tablet Take 1 tablet (100 mg total) by mouth daily. 04/26/19   Mercy Riding, MD  amiodarone (PACERONE) 200 MG tablet TAKE 1 TABLET BY MOUTH  DAILY 12/23/21   Arnoldo Lenis, MD  apixaban (ELIQUIS) 5 MG TABS tablet Take 1 tablet (5 mg total) by mouth 2 (two) times daily. 12/09/21   Arnoldo Lenis, MD  atorvastatin (LIPITOR) 40 MG tablet TAKE 1 TABLET BY MOUTH  DAILY 11/08/21    Arnoldo Lenis, MD  AURYXIA 1 GM 210 MG(Fe) tablet Take 210 mg by mouth 2 (two) times daily. 03/17/21   [provider]  B Complex-C-Zn-Folic Acid (DIALYVITE [id number] 15) 0.8 MG TABS Take 1 tablet by mouth daily. 04/22/20   [provider]  levothyroxine (SYNTHROID, LEVOTHROID) 175 MCG tablet Take 175 mcg by mouth daily before breakfast.     [provider]  lidocaine-prilocaine (EMLA) cream Apply 1 application topically Every Tuesday,Thursday,and Saturday with dialysis. 08/28/20   [provider]  metoprolol succinate (TOPROL-XL) 25 MG 24 hr tablet Take 0.5 tablets (12.5 mg total) by mouth daily. 02/16/21   Arnoldo Lenis, MD  omeprazole (PRILOSEC) 40 MG capsule Take 1 capsule (40 mg total) by mouth in the morning and at bedtime. Patient taking differently: Take 40 mg by mouth daily. 07/27/20 01/02/22  Eloise Harman, DO  sacubitril-valsartan (ENTRESTO) 49-51 MG Take 1 tablet by mouth 2 (two) times daily. 12/09/21   Arnoldo Lenis, MD      Allergies    Advair hfa [fluticasone-salmeterol] and Penicillins    Review of Systems   Review of Systems  Constitutional:  Negative for fever.  Respiratory:  Negative for shortness of breath.   Cardiovascular:  Negative for chest pain.  Skin:  Positive for wound.  All other systems reviewed and are negative.  Physical Exam Updated Vital Signs BP [name])  169/92    Pulse 72    Temp 98.2 F (36.8 C)    Resp (!) 22    Ht 1.753 m (5\' 9" )    Wt 71.9 kg    SpO2 95%    BMI 23.41 kg/m  Physical Exam Vitals and nursing note reviewed.  Constitutional:      Appearance: He is well-developed. He is not ill-appearing.  HENT:     Head: Normocephalic and atraumatic.     Nose: Nose normal.     Mouth/Throat:     Mouth: Mucous membranes are moist.  Eyes:     Pupils: Pupils are equal, round, and reactive to light.  Cardiovascular:     Rate and Rhythm: Normal rate and regular rhythm.     Comments: AV fistula left upper  extremity with compressive dressing in place, with removal of bandage, brisk bleeding noted from ulcer at AV fistula site, dressing reapplied, positive thrill Pulmonary:     Effort: Pulmonary effort is normal. No respiratory distress.  Abdominal:     Palpations: Abdomen is soft.     Tenderness: There is no rebound.  Musculoskeletal:     Cervical back: Neck supple.     Right lower leg: No edema.     Left lower leg: No edema.  Lymphadenopathy:     Cervical: No cervical adenopathy.  Skin:    General: Skin is warm and dry.  Neurological:     Mental Status: He is alert and oriented to person, place, and time.  Psychiatric:        Mood and Affect: Mood normal.    ED Results / Procedures / Treatments   Labs (all labs ordered are listed, but only abnormal results are displayed) Labs Reviewed  RESP PANEL BY RT-PCR (FLU A&B, COVID) [id number]  CBC WITH DIFFERENTIAL/PLATELET  BASIC METABOLIC PANEL  PROTIME-INR    EKG None  Radiology No results found.  Procedures Procedures    Medications Ordered in ED Medications - No data to display  ED Course/ Medical Decision Making/ A&P                           Medical Decision Making Amount and/or Complexity of Data Reviewed Labs: ordered.   This patient presents to the ED for concern of bleeding AV fistula, this involves an extensive number of treatment options, and is a complaint that carries with it a high risk of complications and morbidity.  The differential diagnosis includes ulceration, fistula malfunction  MDM:    Patient presents with a bleeding AV fistula.  Prior ulceration and has had several episodes of intermittent bleeding.  Has had a prior revision.  He is nontoxic and vital signs are reassuring.  He is afebrile.  He has brisk bleeding from ulcerated AV fistula site.  Compressive dressing was reapplied.  This is not amenable to suturing given size and ulceration.  Labs and COVID testing obtained.  We will consult vascular  surgery.  Anticipate he will be transferred to [facility] for definitive management. (Labs, imaging)  Labs: I Ordered, and personally interpreted labs.  The pertinent results include: CBC, BMP, COVID testing pending  Imaging Studies ordered: I ordered imaging studies including none I independently visualized and interpreted imaging. I agree with the radiologist interpretation  Additional history obtained from none.  External records from outside source obtained and reviewed including none  Critical Interventions: Bleeding controlled with compressive bandage  Consultations: I requested consultation with the  vascular surgeon,  and discussed lab and imaging findings as well as pertinent plan - they recommend: Transfer to [facility]  Cardiac Monitoring: The patient was maintained on a cardiac monitor.  I personally viewed and interpreted the cardiac monitored which showed an underlying rhythm of: Normal sinus rhythm  Reevaluation: After the interventions noted above, I reevaluated the patient and found that they have :stayed the same   Considered admission for: AV fistula revision  Social Determinants of Health: Lives independent  Disposition: Admit  Co morbidities that complicate the patient evaluation  Past Medical History:  Diagnosis Date   AAA (abdominal aortic aneurysm)    3.2 cm by 2020 CT; recheck in 3 years recommended   Anemia    Blood transfusion without reported diagnosis    CAD (coronary artery disease)    Stent to [place]; DES x2 to CX 2021   Chronic kidney disease    ESRD on HD   COPD (chronic obstructive pulmonary disease) ([place])    Degenerative joint disease (DJD) of lumbar spine    GERD (gastroesophageal reflux disease)    Gout    Hyperlipidemia    Hypertension    Hypothyroidism    Incisional hernia    abdomen   Leukocytosis    CHRONIC MILD   Myocardial infarction ([place])    1997   SCL CA dx'd 01/2019   Lung cancer   Thoracic aortic aneurysm     4.5cm aortic arch by 07/20/21 CT     Medicines No orders of the defined types were placed in this encounter.   I have reviewed the patients home medicines and have made adjustments as needed  Problem List / ED Course: Problem List Items Addressed This Visit   None Visit Diagnoses     Complication of AV dialysis fistula, initial encounter    -  Primary                   Final Clinical Impression(s) / ED Diagnoses Final diagnoses:  Complication of AV dialysis fistula, initial encounter    Rx / DC Orders ED Discharge Orders     None         Merryl Hacker, MD 01/03/22 [phone]

## 2022-01-03 NOTE — Consult Note (Signed)
Reason for Consult: To manage dialysis and dialysis related needs Referring Physician: [place]  Jeremy Johnson is an 76 y.o. male.  HPI: Pt with HTN, COPD, CAD, AAA and history of lung CA and also ESRD-  TTS at [facility]. He had been having an issue with an AVG ulcer- was actually scheduled to have graft revision on 2/23 by [facility].   He presented to dialysis today and promptly has an brisk acute access bleed so was sent to ER-  Pt tells me the plan is for him to go to [facility] and have said revision and likely [facility] placement later this AM.  He is due for HD today but numbers look OK -  no acute need for HD.  He had full treatment on Saturday -  got to EDW   Dialyzes at [facility]-  TTS 4 hours RKC  EDW 71. HD [place] 2/2.5, Dialyzer 180, Heparin none . Access AVG-  15 guage. Profile 2 Calcitriol 1 mcg TIW, venofer 50 weekly last hgb 9.7 on 2/16  Past Medical History:  Diagnosis Date   AAA (abdominal aortic aneurysm)    3.2 cm by 2020 CT; recheck in 3 years recommended   Anemia    Blood transfusion without reported diagnosis    CAD (coronary artery disease)    Stent to [place]; DES x2 to CX 2021   Chronic kidney disease    ESRD on HD   COPD (chronic obstructive pulmonary disease) ([facility])    Degenerative joint disease (DJD) of lumbar spine    GERD (gastroesophageal reflux disease)    Gout    Hyperlipidemia    Hypertension    Hypothyroidism    Incisional hernia    abdomen   Leukocytosis    CHRONIC MILD   Myocardial infarction ([place])    1997   SCL CA dx'd 01/2019   Lung cancer   Thoracic aortic aneurysm    4.5cm aortic arch by 07/20/21 CT    Past Surgical History:  Procedure Laterality Date   ABDOMINAL AORTIC ANEURYSM REPAIR  2006   AV FISTULA PLACEMENT Left 04/25/2019   Procedure: ARTERIOVENOUS (AV) FISTULA CREATION LEFT ARM;  Surgeon: Angelia Mould, MD;  Location: [place];  Service: Vascular;  Laterality: Left;   AV FISTULA PLACEMENT Left 05/19/2019   Procedure: CONVERSION OF LEFT ARM  ARTERIOVENOUS FISTULA TO GRAFT;  Surgeon: Angelia Mould, MD;  Location: [place];  Service: Vascular;  Laterality: Left;   BIOPSY  09/30/2018   Procedure: BIOPSY;  Surgeon: Danie Binder, MD;  [facility];  Service: Endoscopy;;  ascending colon   BIOPSY  07/27/2020   Procedure: BIOPSY;  Surgeon: Eloise Harman, DO;  [facility];  Service: Endoscopy;;  gastric   BUBBLE STUDY  09/23/2020   Procedure: BUBBLE STUDY;  Surgeon: Skeet Latch, MD;  Location: [place];  Service: Cardiovascular;;   CARDIOVERSION N/A 09/23/2020   Procedure: CARDIOVERSION;  Surgeon: Skeet Latch, MD;  Location: [place];  Service: Cardiovascular;  Laterality: N/A;   COLONOSCOPY  2008   COLONOSCOPY N/A 09/30/2018   External and internal hemorrhoids, six polyps removed, one ascending colon polypoid lesion biopsied. Six simple adenomas and one benign polypoid lesion. Colonoscopy Nov 2022.    COLONOSCOPY WITH PROPOFOL N/A 07/27/2020   non-bleeding internal hemorrhoids, sigmoid and descending colon diverticulosis, four 1-2 mm polyps in ascending colon, one 5 mm polyp in transverse colon. 3 year surveillance. Tubular adenomas.    CORONARY ANGIOPLASTY WITH STENT PLACEMENT  [place]   CORONARY STENT INTERVENTION N/A 09/20/2020   Procedure: CORONARY STENT INTERVENTION;  Surgeon: Martinique, Peter M, MD;  Location: [place] [facility];  Service: Cardiovascular;  Laterality: N/A;   CYSTOSCOPY N/A 08/29/2021   Procedure: CYSTOSCOPY;  Surgeon: Cleon Gustin, MD;  Location: [facility];  Service: Urology;  Laterality: N/A;   CYSTOSCOPY W/ RETROGRADES Bilateral 03/28/2021   Procedure: CYSTOSCOPY WITH RETROGRADE PYELOGRAM;  Surgeon: Cleon Gustin, MD;  Location: [facility];  Service: Urology;  Laterality: Bilateral;   ESOPHAGOGASTRODUODENOSCOPY (EGD) WITH PROPOFOL N/A 07/27/2020   Food in middle third of esophagus, gastritis s/p biopsy, nodular mucosa in lesser curvature of  stomach s/p biopsy. Negative H.pylori.    HIP ARTHROPLASTY Right 04/30/2020   Procedure: ARTHROPLASTY  HIP (HEMIARTHROPLASTY);  Surgeon: Altamese Luke, MD;  Location: [place];  Service: Orthopedics;  Laterality: Right;   INTRAVASCULAR ULTRASOUND/IVUS N/A 09/20/2020   Procedure: Intravascular Ultrasound/IVUS;  Surgeon: Martinique, Peter M, MD;  Location: [place] [facility];  Service: Cardiovascular;  Laterality: N/A;   IR FLUORO GUIDE CV LINE RIGHT  04/22/2019   IR FLUORO GUIDE CV LINE RIGHT  05/01/2020   IR THORACENTESIS ASP PLEURAL SPACE W/IMG GUIDE  09/22/2020   IR THROMBECTOMY AV FISTULA W/THROMBOLYSIS/PTA INC/SHUNT/IMG LEFT Left 05/03/2020   IR US GUIDE VASC ACCESS LEFT  05/03/2020   IR US GUIDE VASC ACCESS RIGHT  04/22/2019   IR US GUIDE VASC ACCESS RIGHT  05/01/2020   JOINT REPLACEMENT     Right   POLYPECTOMY  09/30/2018   Procedure: POLYPECTOMY;  Surgeon: Danie Binder, MD;  [facility];  Service: Endoscopy;;  colon   POLYPECTOMY  07/27/2020   Procedure: POLYPECTOMY;  Surgeon: Eloise Harman, DO;  [facility];  Service: Endoscopy;;   REVISION OF ARTERIOVENOUS GORETEX GRAFT Left 08/18/2021   Procedure: REVISION OF LEFT ARTERIOVENOUS GORETEX GRAFT;  Surgeon: Serafina Mitchell, MD;  [facility];  Service: Vascular;  Laterality: Left;  PERIPHERAL NERVE BLOCK   RIGHT/LEFT HEART CATH AND CORONARY ANGIOGRAPHY N/A 09/20/2020   Procedure: RIGHT/LEFT HEART CATH AND CORONARY ANGIOGRAPHY;  Surgeon: Martinique, Peter M, MD;  Location: [place] [facility];  Service: Cardiovascular;  Laterality: N/A;   TEE WITHOUT CARDIOVERSION N/A 09/23/2020   Procedure: TRANSESOPHAGEAL ECHOCARDIOGRAM (TEE);  Surgeon: Skeet Latch, MD;  Location: [place];  Service: Cardiovascular;  Laterality: N/A;   TRANSURETHRAL RESECTION OF BLADDER TUMOR N/A 03/28/2021   Procedure: TRANSURETHRAL RESECTION OF BLADDER TUMOR (TURBT);  Surgeon: Cleon Gustin, MD;  [facility]: [facility];  Service:  Urology;  Laterality: N/A;   TRANSURETHRAL RESECTION OF BLADDER TUMOR N/A 08/29/2021   Procedure: TRANSURETHRAL RESECTION OF BLADDER TUMOR (TURBT);  Surgeon: Cleon Gustin, MD;  Location: [facility];  Service: Urology;  Laterality: N/A;   VIDEO BRONCHOSCOPY WITH ENDOBRONCHIAL NAVIGATION N/A 01/27/2019   Procedure: VIDEO BRONCHOSCOPY WITH ENDOBRONCHIAL NAVIGATION;  Surgeon: Grace Isaac, MD;  Location: [place];  Service: Thoracic;  Laterality: N/A;   VIDEO BRONCHOSCOPY WITH ENDOBRONCHIAL ULTRASOUND N/A 01/27/2019   Procedure: VIDEO BRONCHOSCOPY WITH ENDOBRONCHIAL ULTRASOUND;  Surgeon: Grace Isaac, MD;  [facility];  Service: Thoracic;  Laterality: N/A;    Family History  Problem Relation Age of Onset   Stroke Brother    Lung cancer Sister 25       lung cancer/former   Colon cancer Neg Hx    Colon polyps Neg Hx     Social History:  reports that he  quit smoking about 4 years ago. His smoking use included cigarettes. He has a 27.00 pack-year smoking history. He has never been exposed to tobacco smoke. He has never used smokeless tobacco. He reports that he does not currently use alcohol. He reports that he does not use drugs.  Allergies:  Allergies  Allergen Reactions   Advair Hfa [Fluticasone-Salmeterol] Other (See Comments)    Developed thrush, although the mouth WAS being rinsed as directed   Penicillins Rash    Has patient had a PCN reaction causing immediate rash, facial/tongue/throat swelling, SOB or lightheadedness with hypotension: No Has patient had a PCN reaction causing severe rash involving mucus membranes or skin necrosis: No Has patient had a PCN reaction that required hospitalization: No Has patient had a PCN reaction occurring within the last 10 years: No If all of the above answers are "NO", then may proceed with Cephalosporin use.     Medications: I have reviewed the patient's current medications.  Results for orders placed or performed during the  hospital encounter of 01/03/22 (from the past 48 hour(s))  Resp Panel by RT-PCR (Flu A&B, Covid) Nasopharyngeal Swab     Status: None   Collection Time: 01/03/22  6:03 AM   Specimen: Nasopharyngeal Swab; Nasopharyngeal(NP) swabs in vial transport medium  Result Value Ref Range   SARS Coronavirus 2 by RT PCR NEGATIVE NEGATIVE    Comment: (NOTE) SARS-CoV-2 target nucleic acids are NOT DETECTED.  The SARS-CoV-2 RNA is generally detectable in upper respiratory specimens during the acute phase of infection. The lowest concentration of SARS-CoV-2 viral copies this assay can detect is 138 copies/mL. A negative result does not preclude SARS-Cov-2 infection and should not be used as the sole basis for treatment or other patient management decisions. A negative result may occur with  improper specimen collection/handling, submission of specimen other than nasopharyngeal swab, presence of viral mutation(s) within the areas targeted by this assay, and inadequate number of viral copies(<138 copies/mL). A negative result must be combined with clinical observations, patient history, and epidemiological information. The expected result is Negative.  Fact Sheet for Patients:  EntrepreneurPulse.com.au  Fact Sheet for Healthcare Providers:  IncredibleEmployment.be  This test is no t yet approved or cleared by the [place] FDA and  has been authorized for detection and/or diagnosis of SARS-CoV-2 by FDA under an Emergency Use Authorization (EUA). This EUA will remain  in effect (meaning this test can be used) for the duration of the COVID-19 declaration under Section 564(b)(1) of the Act, 21 U.S.C.section 360bbb-3(b)(1), unless the authorization is terminated  or revoked sooner.       Influenza A by PCR NEGATIVE NEGATIVE   Influenza B by PCR NEGATIVE NEGATIVE    Comment: (NOTE) The Xpert Xpress SARS-CoV-2/FLU/RSV plus assay is intended as an aid in the  diagnosis of influenza from Nasopharyngeal swab specimens and should not be used as a sole basis for treatment. Nasal washings and aspirates are unacceptable for Xpert Xpress SARS-CoV-2/FLU/RSV testing.  Fact Sheet for Patients: EntrepreneurPulse.com.au  Fact Sheet for Healthcare Providers: IncredibleEmployment.be  This test is not yet approved or cleared by the [place] FDA and has been authorized for detection and/or diagnosis of SARS-CoV-2 by FDA under an Emergency Use Authorization (EUA). This EUA will remain in effect (meaning this test can be used) for the duration of the COVID-19 declaration under Section 564(b)(1) of the Act, 21 U.S.C. section 360bbb-3(b)(1), unless the authorization is terminated or revoked.  Performed at [facility] [place]  [address] [place], [place] [address]   CBC with Differential     Status: Abnormal   Collection Time: 01/03/22  6:14 AM  Result Value Ref Range   WBC 7.3 4.0 - 10.5 K/uL   RBC 2.71 (L) 4.22 - 5.81 MIL/uL   Hemoglobin 9.1 (L) 13.0 - 17.0 g/dL   HCT 27.0 (L) 39.0 - 52.0 %   MCV 99.6 80.0 - 100.0 fL   MCH 33.6 26.0 - 34.0 pg   MCHC 33.7 30.0 - 36.0 g/dL   RDW 15.1 11.5 - 15.5 %   Platelets 177 150 - 400 K/uL   nRBC 0.0 0.0 - 0.2 %   Neutrophils Relative % 50 %   Neutro Abs 3.6 1.7 - 7.7 K/uL   Lymphocytes Relative 34 %   Lymphs Abs 2.5 0.7 - 4.0 K/uL   Monocytes Relative 12 %   Monocytes Absolute 0.9 0.1 - 1.0 K/uL   Eosinophils Relative 3 %   Eosinophils Absolute 0.2 0.0 - 0.5 K/uL   Basophils Relative 1 %   Basophils Absolute 0.1 0.0 - 0.1 K/uL   Immature Granulocytes 0 %   Abs Immature Granulocytes 0.02 0.00 - 0.07 K/uL    Comment: Performed at [facility], [address], [place], [place] [address]  Basic metabolic panel     Status: Abnormal   Collection Time: 01/03/22  6:14 AM  Result Value Ref Range   Sodium 137 135 - 145 mmol/L   Potassium 3.2 (L) 3.5 - 5.1 mmol/L   Chloride 96  (L) 98 - 111 mmol/L   CO2 30 22 - 32 mmol/L   Glucose, Bld 102 (H) 70 - 99 mg/dL    Comment: Glucose reference range applies only to samples taken after fasting for at least 8 hours.   BUN 39 (H) 8 - 23 mg/dL   Creatinine, Ser 9.67 (H) 0.61 - 1.24 mg/dL   Calcium 8.4 (L) 8.9 - 10.3 mg/dL   GFR, Estimated 5 (L) >60 mL/min    Comment: (NOTE) Calculated using the CKD-EPI Creatinine Equation (2021)    Anion gap 11 5 - 15    Comment: Performed at [facility], [address] [place], [place] [address]  Protime-INR     Status: Abnormal   Collection Time: 01/03/22  6:14 AM  Result Value Ref Range   Prothrombin Time 16.1 (H) 11.4 - 15.2 seconds   INR 1.3 (H) 0.8 - 1.2    Comment: (NOTE) INR goal varies based on device and disease states. Performed at [facility], [address], [place], [place] [address]     No results found.  ROS: really negative Blood pressure (!) 158/79, pulse 66, temperature 98.7 F (37.1 C), temperature source Oral, resp. rate 18, height 5\' 9"  (1.753 m), weight 71.9 kg, SpO2 92 %. General appearance: alert and no distress Resp: diminished breath sounds bibasilar Cardio: regular rate and rhythm, S1, S2 normal, no murmur, click, rub or gallop GI: soft, non-tender; bowel sounds normal; no masses,  no organomegaly Extremities: extremities normal, atraumatic, no cyanosis or edema Left AVG-  pressure dressing -  I did not examine  Assessment/Plan: 76 year old WM-  many medical issues including ESRD-  presents with brisk acute AV access bleed  1 Access bleed-  according to patient the plan is for him to be transferred to [facility] to undergo repair and revision 2 ESRD: is due for HD today.  No acute needs- depending on timing of surgery will likely need inpatient HD at  [facility] either tonight or tomorrow AM 3 Hypertension: BP is elevated right now-  likely situational-  usually good in OP setting. Has cardiomyopathy-  on toprol and entresto 4. Anemia of ESRD: last hgb in high  9's-  9.1 today -  likely to go down- had not been on ESA as OP-  will dose here with HD 5. Metabolic Bone Disease: continue calcitriol 1 mcg with HD-  no binder on med list    Louis Meckel 01/03/2022, 8:54 AM

## 2022-01-03 NOTE — Progress Notes (Signed)
SPOKE TO MAUREEN COLLINS, PA-C TO ARRANGE OUTPATIENT DIALYSIS TOMORROW.  HIS UNIT ([facility]) CAN RUN HIM -- NEEDS TO BE THERE AT 5:55AM TOMORROW MORNING.  UPDATED HIS OUTPATIENT ORDERS: TDC and recheck K tomorrow (low here today).  CALL WITH QUESTIONS.  Veneta Penton, PA-C [employer] Pager [phone]

## 2022-01-03 NOTE — ED Triage Notes (Signed)
Pt BIB EMS with a fistula ulcer that started bleeding prior to EMS's arrival. EMS estimates 1-1.5 pint EBL. Pt A&O.

## 2022-01-03 NOTE — ED Notes (Signed)
Per [facility], pt has an ulcer to his fistula on his left arm. Pt is scheduled for surgery Thursday, supposed to go to dialysis today (Tuesday Thursday Saturday HD pt), pt was on the way to HD today and the ulcer ruptured, pt lost about 1.5 pints of blood, pressure dressing in place. +thrill per [facility].

## 2022-01-03 NOTE — Anesthesia Procedure Notes (Signed)
Procedure Name: LMA Insertion Date/Time: 01/03/2022 11:37 AM Performed by: Vonna Drafts, CRNA Pre-anesthesia Checklist: Patient identified, Emergency Drugs available, Suction available, Patient being monitored and Timeout performed Patient Re-evaluated:Patient Re-evaluated prior to induction Oxygen Delivery Method: Circle system utilized Preoxygenation: Pre-oxygenation with 100% oxygen Induction Type: IV induction Ventilation: Mask ventilation without difficulty LMA: LMA inserted LMA Size: 4.0 Number of attempts: 1 Tube secured with: Tape Dental Injury: Teeth and Oropharynx as per pre-operative assessment

## 2022-01-04 ENCOUNTER — Telehealth: Payer: Self-pay

## 2022-01-04 ENCOUNTER — Encounter (HOSPITAL_COMMUNITY): Payer: Self-pay | Admitting: Vascular Surgery

## 2022-01-04 DIAGNOSIS — N2581 Secondary hyperparathyroidism of renal origin: Secondary | ICD-10-CM | POA: Diagnosis not present

## 2022-01-04 DIAGNOSIS — Z992 Dependence on renal dialysis: Secondary | ICD-10-CM | POA: Diagnosis not present

## 2022-01-04 DIAGNOSIS — D689 Coagulation defect, unspecified: Secondary | ICD-10-CM | POA: Diagnosis not present

## 2022-01-04 DIAGNOSIS — D631 Anemia in chronic kidney disease: Secondary | ICD-10-CM | POA: Diagnosis not present

## 2022-01-04 DIAGNOSIS — E876 Hypokalemia: Secondary | ICD-10-CM | POA: Diagnosis not present

## 2022-01-04 DIAGNOSIS — N186 End stage renal disease: Secondary | ICD-10-CM | POA: Diagnosis not present

## 2022-01-04 NOTE — Anesthesia Postprocedure Evaluation (Signed)
Anesthesia Post Note  Patient: Jeremy Johnson  Procedure(s) Performed: Left arm arteriovenous graft revision (Left: Arm Upper) INSERTION OF DIALYSIS CATHETER (Right: Chest)     Patient location during evaluation: PACU Anesthesia Type: General Level of consciousness: awake and alert Pain management: pain level controlled Vital Signs Assessment: post-procedure vital signs reviewed and stable Respiratory status: spontaneous breathing, nonlabored ventilation, respiratory function stable and patient connected to nasal cannula oxygen Cardiovascular status: blood pressure returned to baseline and stable Postop Assessment: no apparent nausea or vomiting Anesthetic complications: no   No notable events documented.  Last Vitals:  Vitals:   01/03/22 1403 01/03/22 1415  BP: (!) 144/73 (!) 141/78  Pulse: 61 (!) 58  Resp: 11 15  Temp:  36.4 C  SpO2: 97% 95%    Last Pain:  Vitals:   01/03/22 1415  TempSrc:   PainSc: 0-No pain                 Kiren Mcisaac

## 2022-01-04 NOTE — Telephone Encounter (Signed)
Pt called to ask if he could remove surgical site dressing. He is okay to do so and aware he will likely have bruising. No further questions/concerns at this time.

## 2022-01-05 ENCOUNTER — Encounter (HOSPITAL_COMMUNITY): Admission: RE | Payer: Self-pay | Source: Ambulatory Visit

## 2022-01-05 ENCOUNTER — Ambulatory Visit (HOSPITAL_COMMUNITY): Admission: RE | Admit: 2022-01-05 | Payer: Medicare Other | Source: Ambulatory Visit | Admitting: Surgery

## 2022-01-05 ENCOUNTER — Telehealth: Payer: Self-pay | Admitting: *Deleted

## 2022-01-05 DIAGNOSIS — D631 Anemia in chronic kidney disease: Secondary | ICD-10-CM | POA: Diagnosis not present

## 2022-01-05 DIAGNOSIS — E876 Hypokalemia: Secondary | ICD-10-CM | POA: Diagnosis not present

## 2022-01-05 DIAGNOSIS — Z992 Dependence on renal dialysis: Secondary | ICD-10-CM | POA: Diagnosis not present

## 2022-01-05 DIAGNOSIS — N2581 Secondary hyperparathyroidism of renal origin: Secondary | ICD-10-CM | POA: Diagnosis not present

## 2022-01-05 DIAGNOSIS — N186 End stage renal disease: Secondary | ICD-10-CM | POA: Diagnosis not present

## 2022-01-05 DIAGNOSIS — D689 Coagulation defect, unspecified: Secondary | ICD-10-CM | POA: Diagnosis not present

## 2022-01-05 SURGERY — REVISION OF ARTERIOVENOUS GORETEX GRAFT
Anesthesia: Choice

## 2022-01-05 NOTE — Telephone Encounter (Signed)
Patient called to ask in he could apply ice to his graft site to help with swelling. Instructed patient to elevate his arm and he can apply ice pack if needed. Patient instructed to call back if increased swelling redness,drainage or fever occurs. Patient verbalized understanding.

## 2022-01-07 DIAGNOSIS — D631 Anemia in chronic kidney disease: Secondary | ICD-10-CM | POA: Diagnosis not present

## 2022-01-07 DIAGNOSIS — N186 End stage renal disease: Secondary | ICD-10-CM | POA: Diagnosis not present

## 2022-01-07 DIAGNOSIS — Z992 Dependence on renal dialysis: Secondary | ICD-10-CM | POA: Diagnosis not present

## 2022-01-07 DIAGNOSIS — N2581 Secondary hyperparathyroidism of renal origin: Secondary | ICD-10-CM | POA: Diagnosis not present

## 2022-01-07 DIAGNOSIS — E876 Hypokalemia: Secondary | ICD-10-CM | POA: Diagnosis not present

## 2022-01-07 DIAGNOSIS — D689 Coagulation defect, unspecified: Secondary | ICD-10-CM | POA: Diagnosis not present

## 2022-01-08 DIAGNOSIS — R5381 Other malaise: Secondary | ICD-10-CM | POA: Diagnosis not present

## 2022-01-08 DIAGNOSIS — R531 Weakness: Secondary | ICD-10-CM | POA: Diagnosis not present

## 2022-01-09 ENCOUNTER — Other Ambulatory Visit: Payer: Self-pay

## 2022-01-09 ENCOUNTER — Telehealth: Payer: Self-pay | Admitting: *Deleted

## 2022-01-09 ENCOUNTER — Ambulatory Visit (INDEPENDENT_AMBULATORY_CARE_PROVIDER_SITE_OTHER): Payer: Medicare Other | Admitting: Surgery

## 2022-01-09 ENCOUNTER — Encounter: Payer: Self-pay | Admitting: Surgery

## 2022-01-09 VITALS — BP 121/74 | HR 72 | Temp 97.7°F | Resp 20 | Ht 69.0 in | Wt 161.0 lb

## 2022-01-09 DIAGNOSIS — N186 End stage renal disease: Secondary | ICD-10-CM

## 2022-01-09 DIAGNOSIS — Z992 Dependence on renal dialysis: Secondary | ICD-10-CM

## 2022-01-09 MED ORDER — SULFAMETHOXAZOLE-TRIMETHOPRIM 400-80 MG PO TABS: 1.0000 | ORAL_TABLET | Freq: Two times a day (BID) | ORAL | 0 refills | Status: DC

## 2022-01-09 NOTE — Telephone Encounter (Signed)
Patient's wife called stating patient had fallen over the weekend and landed on his left arm AVF.  He is having clear drainage and swelling. Wife called [phone].  They dressed the left arm and stated the fall had "rolled" his skin around AVF.  Wife states she has changed the dressing multiple times due to heavy drainage.  Spoke with Dr Trula Slade and an appointment was scheduled for today at 3:00.  Wife was notified and voiced understanding of the instructions.

## 2022-01-09 NOTE — Progress Notes (Signed)
Patient name: Jeremy Johnson MRN: [id number] DOB: Feb 26, 1946 Sex: male  REASON FOR VISIT:     Post op  HISTORY OF PRESENT ILLNESS:    Jeremy Johnson is a 76 y.o. male who is status post revision of left upper extremity Gore-Tex graft with ulceration by myself on 08/18/2021.  The ulcerated area returned.  He went to the operating room for revision and a catheter by Dr. Donzetta Matters on 01/03/2022.  He recently fell and has a wound on the back of his arm.  This is been draining a lot.  He also has a fair amount of swelling  CURRENT MEDICATIONS:    Current Outpatient Medications  Medication Sig Dispense Refill   acetaminophen (TYLENOL) 325 MG tablet Take 2 tablets (650 mg total) by mouth every 6 (six) hours as needed for mild pain (or Fever >/= 101). (Patient taking differently: Take 650 mg by mouth every 6 (six) hours as needed for mild pain or headache (or Fever >/= 101).) 30 tablet 0   allopurinol (ZYLOPRIM) 100 MG tablet Take 1 tablet (100 mg total) by mouth daily. 30 tablet 1   amiodarone (PACERONE) 200 MG tablet TAKE 1 TABLET BY MOUTH  DAILY (Patient taking differently: Take 200 mg by mouth daily.) 90 tablet 3   apixaban (ELIQUIS) 5 MG TABS tablet Take 1 tablet (5 mg total) by mouth 2 (two) times daily. 60 tablet 5   atorvastatin (LIPITOR) 40 MG tablet TAKE 1 TABLET BY MOUTH  DAILY (Patient taking differently: Take 40 mg by mouth daily.) 90 tablet 3   B Complex-C-Zn-Folic Acid (DIALYVITE 258-NIDP 15) 0.8 MG TABS Take 1 tablet by mouth daily.     HYDROcodone-acetaminophen (NORCO/VICODIN) 5-325 MG tablet Take 1 tablet by mouth every 6 (six) hours as needed for moderate pain. 30 tablet 0   levothyroxine (SYNTHROID, LEVOTHROID) 175 MCG tablet Take 175 mcg by mouth daily before breakfast.      lidocaine-prilocaine (EMLA) cream Apply 1 application topically Every Tuesday,Thursday,and Saturday with dialysis.     metoprolol succinate (TOPROL-XL) 25 MG 24 hr tablet Take  0.5 tablets (12.5 mg total) by mouth daily. 45 tablet 3   sacubitril-valsartan (ENTRESTO) 49-51 MG Take 1 tablet by mouth 2 (two) times daily. 60 tablet 5   sulfamethoxazole-trimethoprim (BACTRIM) 400-80 MG tablet Take 1 tablet by mouth 2 (two) times daily. 20 tablet 0   omeprazole (PRILOSEC) 40 MG capsule Take 1 capsule (40 mg total) by mouth in the morning and at bedtime. (Patient taking differently: Take 40 mg by mouth daily.) 60 capsule 5   No current facility-administered medications for this visit.    REVIEW OF SYSTEMS:   [X]  denotes positive finding, [ ]  denotes negative finding Cardiac  Comments:  Chest pain or chest pressure:    Shortness of breath upon exertion:    Short of breath when lying flat:    Irregular heart rhythm:    Constitutional    Fever or chills:      PHYSICAL EXAM:   Vitals:   01/09/22 1451  BP: 121/74  Pulse: 72  Resp: 20  Temp: 97.7 F (36.5 C)  SpO2: 96%  Weight: 161 lb (73 kg)  Height: 5\' 9"  (1.753 m)    GENERAL: The patient is a well-nourished male, in no acute distress. The vital signs are documented above. CARDIOVASCULAR: There is a regular rate and rhythm. PULMONARY: Non-labored respirations Erythema around his new Gore-Tex graft, unclear if this is infection versus Gore-Tex reaction.  In addition  he has a fair amount of edema in the arm particularly in the hand but throughout the arm up to the shoulder.  There is a large wound on the posterior side of his elbow with significant drainage    STUDIES:   None   MEDICAL ISSUES:   It is unclear whether or not this is a Gore-Tex reaction or infection around his new graft.  I am giving him 2 weeks worth of Bactrim.  With regards to his wound, I will treat this with Xeroform and an ABD pad and place his arm in a tight wrap with Kerlix and an Ace bandage.  I Georgina Peer get home health to have this changed on a daily basis.  He will come back in in a week for a wound check  [name], IV, MD,  FACS [facility] [phone] Pager [phone]

## 2022-01-09 NOTE — Telephone Encounter (Signed)
See previous telephone encounter.

## 2022-01-10 DIAGNOSIS — E876 Hypokalemia: Secondary | ICD-10-CM | POA: Diagnosis not present

## 2022-01-10 DIAGNOSIS — D689 Coagulation defect, unspecified: Secondary | ICD-10-CM | POA: Diagnosis not present

## 2022-01-10 DIAGNOSIS — D631 Anemia in chronic kidney disease: Secondary | ICD-10-CM | POA: Diagnosis not present

## 2022-01-10 DIAGNOSIS — N2581 Secondary hyperparathyroidism of renal origin: Secondary | ICD-10-CM | POA: Diagnosis not present

## 2022-01-10 DIAGNOSIS — N186 End stage renal disease: Secondary | ICD-10-CM | POA: Diagnosis not present

## 2022-01-10 DIAGNOSIS — I129 Hypertensive chronic kidney disease with stage 1 through stage 4 chronic kidney disease, or unspecified chronic kidney disease: Secondary | ICD-10-CM | POA: Diagnosis not present

## 2022-01-10 DIAGNOSIS — Z992 Dependence on renal dialysis: Secondary | ICD-10-CM | POA: Diagnosis not present

## 2022-01-12 ENCOUNTER — Other Ambulatory Visit: Payer: Medicare Other

## 2022-01-12 ENCOUNTER — Telehealth: Payer: Self-pay | Admitting: Cardiology

## 2022-01-12 DIAGNOSIS — D631 Anemia in chronic kidney disease: Secondary | ICD-10-CM | POA: Diagnosis not present

## 2022-01-12 DIAGNOSIS — Z992 Dependence on renal dialysis: Secondary | ICD-10-CM | POA: Diagnosis not present

## 2022-01-12 DIAGNOSIS — E876 Hypokalemia: Secondary | ICD-10-CM | POA: Diagnosis not present

## 2022-01-12 DIAGNOSIS — N186 End stage renal disease: Secondary | ICD-10-CM | POA: Diagnosis not present

## 2022-01-12 DIAGNOSIS — D689 Coagulation defect, unspecified: Secondary | ICD-10-CM | POA: Diagnosis not present

## 2022-01-12 DIAGNOSIS — N2581 Secondary hyperparathyroidism of renal origin: Secondary | ICD-10-CM | POA: Diagnosis not present

## 2022-01-12 NOTE — Progress Notes (Addendum)
?POST OPERATIVE OFFICE NOTE ? ? ? ?CC:  F/u for surgery ? ?HPI:  This is a 76 y.o. male who is s/p revision of left upper extremity Gore-Tex graft with ulceration by Dr. Trula Slade on 08/18/2021.  The ulcerated area returned.  He went to the operating room for revision and a catheter by Dr. Donzetta Matters on 01/03/2022. ? ?Pt was last seen on 01/09/2022 as he had recently fallen and had a wound on the back of his arm and was draining a lot and also had some swelling.  Dr. Trula Slade felt it was unclear if this was a gore tex reaction or infection around the new graft.  He prescribed 2 weeks of bactrim.  He treated the wound with Xeroform and ABD pad with a tight wrap and Kerlix and ace.  HH was ordered for daily wound care and he was scheduled to return for wound check.   ? ?He returns today for that visit.  He states that the back of his arm is healing from when he fell.  He states that at the time of the fall, he did have some swelling in the left arm that got better but when he woke up this morning, he had more swelling in the left arm down to the hand.  His incisions have healed.  They are using the [facility] without difficulty.  He states there has been some bloody ooze around the catheter.  He is on Eliquis.   ? ? ? ? ?Allergies  ?Allergen Reactions  ? Advair Hfa [Fluticasone-Salmeterol] Other (See Comments)  ?  Developed thrush, although the mouth WAS being rinsed as directed  ? Penicillins Rash  ?  Has patient had a PCN reaction causing immediate rash, facial/tongue/throat swelling, SOB or lightheadedness with hypotension: No ?Has patient had a PCN reaction causing severe rash involving mucus membranes or skin necrosis: No ?Has patient had a PCN reaction that required hospitalization: No ?Has patient had a PCN reaction occurring within the last 10 years: No ?If all of the above answers are "NO", then may proceed with Cephalosporin use. ?  ? ? ?Current Outpatient Medications  ?Medication Sig Dispense Refill  ? acetaminophen  (TYLENOL) 325 MG tablet Take 2 tablets (650 mg total) by mouth every 6 (six) hours as needed for mild pain (or Fever >/= 101). (Patient taking differently: Take 650 mg by mouth every 6 (six) hours as needed for mild pain or headache (or Fever >/= 101).) 30 tablet 0  ? allopurinol (ZYLOPRIM) 100 MG tablet Take 1 tablet (100 mg total) by mouth daily. 30 tablet 1  ? amiodarone (PACERONE) 200 MG tablet TAKE 1 TABLET BY MOUTH  DAILY (Patient taking differently: Take 200 mg by mouth daily.) 90 tablet 3  ? apixaban (ELIQUIS) 5 MG TABS tablet Take 1 tablet (5 mg total) by mouth 2 (two) times daily. 60 tablet 5  ? atorvastatin (LIPITOR) 40 MG tablet TAKE 1 TABLET BY MOUTH  DAILY (Patient taking differently: Take 40 mg by mouth daily.) 90 tablet 3  ? B Complex-C-Zn-Folic Acid (DIALYVITE 607-PXTG 15) 0.8 MG TABS Take 1 tablet by mouth daily.    ? HYDROcodone-acetaminophen (NORCO/VICODIN) 5-325 MG tablet Take 1 tablet by mouth every 6 (six) hours as needed for moderate pain. 30 tablet 0  ? levothyroxine (SYNTHROID, LEVOTHROID) 175 MCG tablet Take 175 mcg by mouth daily before breakfast.     ? lidocaine-prilocaine (EMLA) cream Apply 1 application topically Every Tuesday,Thursday,and Saturday with dialysis.    ? metoprolol succinate (  TOPROL-XL) 25 MG 24 hr tablet Take 0.5 tablets (12.5 mg total) by mouth daily. 45 tablet 3  ? omeprazole (PRILOSEC) 40 MG capsule Take 1 capsule (40 mg total) by mouth in the morning and at bedtime. (Patient taking differently: Take 40 mg by mouth daily.) 60 capsule 5  ? sacubitril-valsartan (ENTRESTO) 49-51 MG Take 1 tablet by mouth 2 (two) times daily. 60 tablet 5  ? sulfamethoxazole-trimethoprim (BACTRIM) 400-80 MG tablet Take 1 tablet by mouth 2 (two) times daily. 20 tablet 0  ? ?No current facility-administered medications for this visit.  ? ? ? ROS:  See HPI ? ?Physical Exam: ? ?Today's Vitals  ? 01/18/22 1215  ?BP: 131/73  ?Pulse: 68  ?Temp: (!) 96.8 ?F (36 ?C)  ?Weight: 159 lb 14.4 oz (72.5  kg)  ?Height: 5\' 9"  (1.753 m)  ? ?Body mass index is 23.61 kg/m?. ? ? ?Incision:  all have healed ?Extremities:   ?There is a palpable left radial pulse.   ?Motor and sensory are in tact.   ?There is a thrill/bruit present.  ?The graft is easily palpable ?-there is some swelling in the hand and lower arm as well as around the LUA AVG.   ? ? ? ? ? ?Assessment/Plan:  This is a 76 y.o. male who is s/p: ?evision of left upper extremity Gore-Tex graft with ulceration by Dr. Trula Slade on 08/18/2021.  The ulcerated area returned.  He went to the operating room for revision and a catheter by Dr. Donzetta Matters on 01/03/2022. ? ?-the pt does not have evidence of steal. ?-would continue to use the catheter for now and let the swelling around the graft continue to improve.  I discussed his hand swelling with Dr. Donzetta Matters.  We will schedule him for a fistulogram to evaluate for central venous stenosis in a couple of weeks. He will need to be off of his eliquis for a couple of days.   ?-would have low suspicion for DVT given he is compliant with his Eliquis.  ?-If pt has a tunneled dialysis catheter and the access has been used successfully to the satisfaction of the dialysis center, the tunneled catheter can be scheduled to be removed at their discretion.   ? ? ? ?Leontine Locket, PAC ?[facility] ?[phone] ? ?Clinic MD:  Donzetta Matters ? ?

## 2022-01-12 NOTE — Telephone Encounter (Signed)
Patient calling to say that he has been falling a lot lately.He would like to speak to the nurse/dr in regards to it. Please advise ?

## 2022-01-12 NOTE — Telephone Encounter (Signed)
Pt stated that he fell Sunday morning around 5 am and peeled his skin back a little. Pt thought he had busted open his graft from surgery placing his port for dialysis, but did not. Pt stated that he wrapped up his arm and it is not bleeding now. Pt stated that he is still taking Eliquis as directed.  ? ?Pt also stated that he saw nephrology today who stopped his Entresto d/t low bp's. Pt would like to know if Dr. Harl Bowie agrees with this course of action.  ? ?Please advise.  ?

## 2022-01-12 NOTE — H&P (View-Only) (Signed)
?POST OPERATIVE OFFICE NOTE ? ? ? ?CC:  F/u for surgery ? ?HPI:  This is a 76 y.o. male who is s/p revision of left upper extremity Gore-Tex graft with ulceration by Dr. Trula Slade on 08/18/2021.  The ulcerated area returned.  He went to the operating room for revision and a catheter by Dr. Donzetta Matters on 01/03/2022. ? ?Pt was last seen on 01/09/2022 as he had recently fallen and had a wound on the back of his arm and was draining a lot and also had some swelling.  Dr. Trula Slade felt it was unclear if this was a gore tex reaction or infection around the new graft.  He prescribed 2 weeks of bactrim.  He treated the wound with Xeroform and ABD pad with a tight wrap and Kerlix and ace.  HH was ordered for daily wound care and he was scheduled to return for wound check.   ? ?He returns today for that visit.  He states that the back of his arm is healing from when he fell.  He states that at the time of the fall, he did have some swelling in the left arm that got better but when he woke up this morning, he had more swelling in the left arm down to the hand.  His incisions have healed.  They are using the [facility] without difficulty.  He states there has been some bloody ooze around the catheter.  He is on Eliquis.   ? ? ? ? ?Allergies  ?Allergen Reactions  ? Advair Hfa [Fluticasone-Salmeterol] Other (See Comments)  ?  Developed thrush, although the mouth WAS being rinsed as directed  ? Penicillins Rash  ?  Has patient had a PCN reaction causing immediate rash, facial/tongue/throat swelling, SOB or lightheadedness with hypotension: No ?Has patient had a PCN reaction causing severe rash involving mucus membranes or skin necrosis: No ?Has patient had a PCN reaction that required hospitalization: No ?Has patient had a PCN reaction occurring within the last 10 years: No ?If all of the above answers are "NO", then may proceed with Cephalosporin use. ?  ? ? ?Current Outpatient Medications  ?Medication Sig Dispense Refill  ? acetaminophen  (TYLENOL) 325 MG tablet Take 2 tablets (650 mg total) by mouth every 6 (six) hours as needed for mild pain (or Fever >/= 101). (Patient taking differently: Take 650 mg by mouth every 6 (six) hours as needed for mild pain or headache (or Fever >/= 101).) 30 tablet 0  ? allopurinol (ZYLOPRIM) 100 MG tablet Take 1 tablet (100 mg total) by mouth daily. 30 tablet 1  ? amiodarone (PACERONE) 200 MG tablet TAKE 1 TABLET BY MOUTH  DAILY (Patient taking differently: Take 200 mg by mouth daily.) 90 tablet 3  ? apixaban (ELIQUIS) 5 MG TABS tablet Take 1 tablet (5 mg total) by mouth 2 (two) times daily. 60 tablet 5  ? atorvastatin (LIPITOR) 40 MG tablet TAKE 1 TABLET BY MOUTH  DAILY (Patient taking differently: Take 40 mg by mouth daily.) 90 tablet 3  ? B Complex-C-Zn-Folic Acid (DIALYVITE 269-SWNI 15) 0.8 MG TABS Take 1 tablet by mouth daily.    ? HYDROcodone-acetaminophen (NORCO/VICODIN) 5-325 MG tablet Take 1 tablet by mouth every 6 (six) hours as needed for moderate pain. 30 tablet 0  ? levothyroxine (SYNTHROID, LEVOTHROID) 175 MCG tablet Take 175 mcg by mouth daily before breakfast.     ? lidocaine-prilocaine (EMLA) cream Apply 1 application topically Every Tuesday,Thursday,and Saturday with dialysis.    ? metoprolol succinate (  TOPROL-XL) 25 MG 24 hr tablet Take 0.5 tablets (12.5 mg total) by mouth daily. 45 tablet 3  ? omeprazole (PRILOSEC) 40 MG capsule Take 1 capsule (40 mg total) by mouth in the morning and at bedtime. (Patient taking differently: Take 40 mg by mouth daily.) 60 capsule 5  ? sacubitril-valsartan (ENTRESTO) 49-51 MG Take 1 tablet by mouth 2 (two) times daily. 60 tablet 5  ? sulfamethoxazole-trimethoprim (BACTRIM) 400-80 MG tablet Take 1 tablet by mouth 2 (two) times daily. 20 tablet 0  ? ?No current facility-administered medications for this visit.  ? ? ? ROS:  See HPI ? ?Physical Exam: ? ?Today's Vitals  ? 01/18/22 1215  ?BP: 131/73  ?Pulse: 68  ?Temp: (!) 96.8 ?F (36 ?C)  ?Weight: 159 lb 14.4 oz (72.5  kg)  ?Height: 5\' 9"  (1.753 m)  ? ?Body mass index is 23.61 kg/m?. ? ? ?Incision:  all have healed ?Extremities:   ?There is a palpable left radial pulse.   ?Motor and sensory are in tact.   ?There is a thrill/bruit present.  ?The graft is easily palpable ?-there is some swelling in the hand and lower arm as well as around the LUA AVG.   ? ? ? ? ? ?Assessment/Plan:  This is a 76 y.o. male who is s/p: ?evision of left upper extremity Gore-Tex graft with ulceration by Dr. Trula Slade on 08/18/2021.  The ulcerated area returned.  He went to the operating room for revision and a catheter by Dr. Donzetta Matters on 01/03/2022. ? ?-the pt does not have evidence of steal. ?-would continue to use the catheter for now and let the swelling around the graft continue to improve.  I discussed his hand swelling with Dr. Donzetta Matters.  We will schedule him for a fistulogram to evaluate for central venous stenosis in a couple of weeks. He will need to be off of his eliquis for a couple of days.   ?-would have low suspicion for DVT given he is compliant with his Eliquis.  ?-If pt has a tunneled dialysis catheter and the access has been used successfully to the satisfaction of the dialysis center, the tunneled catheter can be scheduled to be removed at their discretion.   ? ? ? ?Leontine Locket, PAC ?[facility] ?[phone] ? ?Clinic MD:  Donzetta Matters ? ?

## 2022-01-14 DIAGNOSIS — E876 Hypokalemia: Secondary | ICD-10-CM | POA: Diagnosis not present

## 2022-01-14 DIAGNOSIS — D631 Anemia in chronic kidney disease: Secondary | ICD-10-CM | POA: Diagnosis not present

## 2022-01-14 DIAGNOSIS — D689 Coagulation defect, unspecified: Secondary | ICD-10-CM | POA: Diagnosis not present

## 2022-01-14 DIAGNOSIS — Z992 Dependence on renal dialysis: Secondary | ICD-10-CM | POA: Diagnosis not present

## 2022-01-14 DIAGNOSIS — N186 End stage renal disease: Secondary | ICD-10-CM | POA: Diagnosis not present

## 2022-01-14 DIAGNOSIS — N2581 Secondary hyperparathyroidism of renal origin: Secondary | ICD-10-CM | POA: Diagnosis not present

## 2022-01-16 ENCOUNTER — Inpatient Hospital Stay: Payer: Medicare Other | Attending: Internal Medicine

## 2022-01-16 ENCOUNTER — Other Ambulatory Visit: Payer: Self-pay

## 2022-01-16 ENCOUNTER — Telehealth: Payer: Self-pay | Admitting: Medical Oncology

## 2022-01-16 ENCOUNTER — Ambulatory Visit (HOSPITAL_COMMUNITY)
Admission: RE | Admit: 2022-01-16 | Discharge: 2022-01-16 | Disposition: A | Discharge status: Home / Self Care | Payer: Medicare Other | Source: Ambulatory Visit | Attending: Internal Medicine | Admitting: Internal Medicine

## 2022-01-16 DIAGNOSIS — Z7901 Long term (current) use of anticoagulants: Secondary | ICD-10-CM | POA: Insufficient documentation

## 2022-01-16 DIAGNOSIS — Z86010 Personal history of colonic polyps: Secondary | ICD-10-CM | POA: Insufficient documentation

## 2022-01-16 DIAGNOSIS — Z923 Personal history of irradiation: Secondary | ICD-10-CM | POA: Insufficient documentation

## 2022-01-16 DIAGNOSIS — J90 Pleural effusion, not elsewhere classified: Secondary | ICD-10-CM | POA: Insufficient documentation

## 2022-01-16 DIAGNOSIS — R5383 Other fatigue: Secondary | ICD-10-CM | POA: Insufficient documentation

## 2022-01-16 DIAGNOSIS — I5021 Acute systolic (congestive) heart failure: Secondary | ICD-10-CM | POA: Insufficient documentation

## 2022-01-16 DIAGNOSIS — C3490 Malignant neoplasm of unspecified part of unspecified bronchus or lung: Secondary | ICD-10-CM | POA: Diagnosis not present

## 2022-01-16 DIAGNOSIS — I700 Atherosclerosis of aorta: Secondary | ICD-10-CM | POA: Diagnosis not present

## 2022-01-16 DIAGNOSIS — N6489 Other specified disorders of breast: Secondary | ICD-10-CM | POA: Insufficient documentation

## 2022-01-16 DIAGNOSIS — N6342 Unspecified lump in left breast, subareolar: Secondary | ICD-10-CM | POA: Insufficient documentation

## 2022-01-16 DIAGNOSIS — I1311 Hypertensive heart and chronic kidney disease without heart failure, with stage 5 chronic kidney disease, or end stage renal disease: Secondary | ICD-10-CM | POA: Insufficient documentation

## 2022-01-16 DIAGNOSIS — I252 Old myocardial infarction: Secondary | ICD-10-CM | POA: Insufficient documentation

## 2022-01-16 DIAGNOSIS — K219 Gastro-esophageal reflux disease without esophagitis: Secondary | ICD-10-CM | POA: Insufficient documentation

## 2022-01-16 DIAGNOSIS — Z79899 Other long term (current) drug therapy: Secondary | ICD-10-CM | POA: Insufficient documentation

## 2022-01-16 DIAGNOSIS — N186 End stage renal disease: Secondary | ICD-10-CM | POA: Insufficient documentation

## 2022-01-16 DIAGNOSIS — Z992 Dependence on renal dialysis: Secondary | ICD-10-CM | POA: Insufficient documentation

## 2022-01-16 DIAGNOSIS — C3411 Malignant neoplasm of upper lobe, right bronchus or lung: Secondary | ICD-10-CM | POA: Insufficient documentation

## 2022-01-16 DIAGNOSIS — Z880 Allergy status to penicillin: Secondary | ICD-10-CM | POA: Insufficient documentation

## 2022-01-16 DIAGNOSIS — J811 Chronic pulmonary edema: Secondary | ICD-10-CM | POA: Insufficient documentation

## 2022-01-16 DIAGNOSIS — K439 Ventral hernia without obstruction or gangrene: Secondary | ICD-10-CM | POA: Insufficient documentation

## 2022-01-16 DIAGNOSIS — J439 Emphysema, unspecified: Secondary | ICD-10-CM | POA: Diagnosis not present

## 2022-01-16 DIAGNOSIS — Z8719 Personal history of other diseases of the digestive system: Secondary | ICD-10-CM | POA: Insufficient documentation

## 2022-01-16 DIAGNOSIS — D61818 Other pancytopenia: Secondary | ICD-10-CM | POA: Insufficient documentation

## 2022-01-16 LAB — CMP (CANCER CENTER ONLY)
ALT: 16 U/L (ref 0–44)
AST: 26 U/L (ref 15–41)
Albumin: 2.9 g/dL — ABNORMAL LOW (ref 3.5–5.0)
Alkaline Phosphatase: 83 U/L (ref 38–126)
Anion gap: 8 (ref 5–15)
BUN: 23 mg/dL (ref 8–23)
CO2: 33 mmol/L — ABNORMAL HIGH (ref 22–32)
Calcium: 8.7 mg/dL — ABNORMAL LOW (ref 8.9–10.3)
Chloride: 94 mmol/L — ABNORMAL LOW (ref 98–111)
Creatinine: 7.16 mg/dL (ref 0.61–1.24)
GFR, Estimated: 7 mL/min — ABNORMAL LOW (ref >60)
Glucose, Bld: 150 mg/dL — ABNORMAL HIGH (ref 70–99)
Potassium: 3.3 mmol/L — ABNORMAL LOW (ref 3.5–5.1)
Sodium: 135 mmol/L (ref 135–145)
Total Bilirubin: 0.4 mg/dL (ref 0.3–1.2)
Total Protein: 6.1 g/dL — ABNORMAL LOW (ref 6.5–8.1)

## 2022-01-16 LAB — CBC WITH DIFFERENTIAL (CANCER CENTER ONLY)
Abs Immature Granulocytes: 0.05 10*3/uL (ref 0.00–0.07)
Basophils Absolute: 0.1 10*3/uL (ref 0.0–0.1)
Basophils Relative: 1 %
Eosinophils Absolute: 0.2 10*3/uL (ref 0.0–0.5)
Eosinophils Relative: 3 %
HCT: 23.4 % — ABNORMAL LOW (ref 39.0–52.0)
Hemoglobin: 8.1 g/dL — ABNORMAL LOW (ref 13.0–17.0)
Immature Granulocytes: 1 %
Lymphocytes Relative: 20 %
Lymphs Abs: 1.8 10*3/uL (ref 0.7–4.0)
MCH: 33.5 pg (ref 26.0–34.0)
MCHC: 34.6 g/dL (ref 30.0–36.0)
MCV: 96.7 fL (ref 80.0–100.0)
Monocytes Absolute: 0.9 10*3/uL (ref 0.1–1.0)
Monocytes Relative: 10 %
Neutro Abs: 5.9 10*3/uL (ref 1.7–7.7)
Neutrophils Relative %: 65 %
Platelet Count: 161 10*3/uL (ref 150–400)
RBC: 2.42 MIL/uL — ABNORMAL LOW (ref 4.22–5.81)
RDW: 16.4 % — ABNORMAL HIGH (ref 11.5–15.5)
WBC Count: 8.9 10*3/uL (ref 4.0–10.5)
nRBC: 0 % (ref 0.0–0.2)

## 2022-01-16 NOTE — Telephone Encounter (Signed)
CRITICAL VALUE STICKER ? ?CRITICAL VALUE: creatinine 7.16 ? ?RECEIVER (on-site recipient of call):Merle Cirelli ? ?DATE & TIME NOTIFIED: 01/16/2022 at 1022 ? ?MESSENGER (representative from lab): ?Kasandra Knudsen ?MD NOTIFIED: Julien Nordmann  ? ?TIME OF NOTIFICATION:1023 ? ?RESPONSE:  pt has ESRD on dialysis ?

## 2022-01-17 DIAGNOSIS — N2581 Secondary hyperparathyroidism of renal origin: Secondary | ICD-10-CM | POA: Diagnosis not present

## 2022-01-17 DIAGNOSIS — E876 Hypokalemia: Secondary | ICD-10-CM | POA: Diagnosis not present

## 2022-01-17 DIAGNOSIS — Z992 Dependence on renal dialysis: Secondary | ICD-10-CM | POA: Diagnosis not present

## 2022-01-17 DIAGNOSIS — N186 End stage renal disease: Secondary | ICD-10-CM | POA: Diagnosis not present

## 2022-01-17 DIAGNOSIS — D631 Anemia in chronic kidney disease: Secondary | ICD-10-CM | POA: Diagnosis not present

## 2022-01-17 DIAGNOSIS — D689 Coagulation defect, unspecified: Secondary | ICD-10-CM | POA: Diagnosis not present

## 2022-01-17 NOTE — Telephone Encounter (Signed)
Im ok stopping entresto for now, we may retry staring back the losartan he had previously been on pending how his bp's do foing forward ? ?J Zaydah Nawabi MD ?

## 2022-01-18 ENCOUNTER — Other Ambulatory Visit: Payer: Self-pay

## 2022-01-18 ENCOUNTER — Ambulatory Visit (INDEPENDENT_AMBULATORY_CARE_PROVIDER_SITE_OTHER): Payer: Medicare Other | Admitting: Physician Assistant

## 2022-01-18 VITALS — BP 131/73 | HR 68 | Temp 96.8°F | Ht 69.0 in | Wt 159.9 lb

## 2022-01-18 DIAGNOSIS — N186 End stage renal disease: Secondary | ICD-10-CM

## 2022-01-18 DIAGNOSIS — Z992 Dependence on renal dialysis: Secondary | ICD-10-CM

## 2022-01-18 NOTE — Telephone Encounter (Signed)
Pt states that he is feeling fine off the Enteresto. BP's are as follows 123/55 Hr 67, 114/54 71, 148/71 64,142/63 60. Pt and wife report that pt gives out easy. Reports that he has been falling a lot. Please advise.  ?

## 2022-01-19 ENCOUNTER — Other Ambulatory Visit: Payer: Medicare Other

## 2022-01-19 ENCOUNTER — Other Ambulatory Visit: Payer: Self-pay

## 2022-01-19 DIAGNOSIS — D631 Anemia in chronic kidney disease: Secondary | ICD-10-CM | POA: Diagnosis not present

## 2022-01-19 DIAGNOSIS — Z992 Dependence on renal dialysis: Secondary | ICD-10-CM | POA: Diagnosis not present

## 2022-01-19 DIAGNOSIS — N2581 Secondary hyperparathyroidism of renal origin: Secondary | ICD-10-CM | POA: Diagnosis not present

## 2022-01-19 DIAGNOSIS — E876 Hypokalemia: Secondary | ICD-10-CM | POA: Diagnosis not present

## 2022-01-19 DIAGNOSIS — N186 End stage renal disease: Secondary | ICD-10-CM | POA: Diagnosis not present

## 2022-01-19 DIAGNOSIS — D689 Coagulation defect, unspecified: Secondary | ICD-10-CM | POA: Diagnosis not present

## 2022-01-20 ENCOUNTER — Other Ambulatory Visit: Payer: Self-pay

## 2022-01-20 ENCOUNTER — Telehealth: Payer: Self-pay | Admitting: *Deleted

## 2022-01-20 NOTE — Telephone Encounter (Signed)
Pt notified and states he would FU with PCP.  ?

## 2022-01-20 NOTE — Telephone Encounter (Signed)
Patient called c/o "a lot" of swelling of his left arm s/p revision of left upper Gore Tex graft.  ?I reminded patient to elevate his arm on 2-3 pillows, move his fingers often and open and close his hand as if squeezing a ball.  Also have his wife to apply a warm compression. ?Patient voiced understanding of the instructions.  He was instructed to call if symptoms worsen. ?

## 2022-01-20 NOTE — Telephone Encounter (Signed)
BP's are fine off enetresto, would touch base with pcp for symptmos of giving out easy. If they think could be something cardiac could see him but would start with broader evaluation that pcp would be able to consider ? ?Zandra Abts MD ?

## 2022-01-21 DIAGNOSIS — Z992 Dependence on renal dialysis: Secondary | ICD-10-CM | POA: Diagnosis not present

## 2022-01-21 DIAGNOSIS — D689 Coagulation defect, unspecified: Secondary | ICD-10-CM | POA: Diagnosis not present

## 2022-01-21 DIAGNOSIS — D631 Anemia in chronic kidney disease: Secondary | ICD-10-CM | POA: Diagnosis not present

## 2022-01-21 DIAGNOSIS — E876 Hypokalemia: Secondary | ICD-10-CM | POA: Diagnosis not present

## 2022-01-21 DIAGNOSIS — N2581 Secondary hyperparathyroidism of renal origin: Secondary | ICD-10-CM | POA: Diagnosis not present

## 2022-01-21 DIAGNOSIS — N186 End stage renal disease: Secondary | ICD-10-CM | POA: Diagnosis not present

## 2022-01-23 ENCOUNTER — Inpatient Hospital Stay (HOSPITAL_BASED_OUTPATIENT_CLINIC_OR_DEPARTMENT_OTHER): Payer: Medicare Other | Admitting: Internal Medicine

## 2022-01-23 ENCOUNTER — Other Ambulatory Visit: Payer: Self-pay

## 2022-01-23 VITALS — BP 149/79 | HR 70 | Temp 97.8°F | Resp 16 | Wt 161.5 lb

## 2022-01-23 DIAGNOSIS — N6489 Other specified disorders of breast: Secondary | ICD-10-CM | POA: Diagnosis not present

## 2022-01-23 DIAGNOSIS — K219 Gastro-esophageal reflux disease without esophagitis: Secondary | ICD-10-CM | POA: Diagnosis not present

## 2022-01-23 DIAGNOSIS — R5383 Other fatigue: Secondary | ICD-10-CM | POA: Diagnosis not present

## 2022-01-23 DIAGNOSIS — I252 Old myocardial infarction: Secondary | ICD-10-CM | POA: Diagnosis not present

## 2022-01-23 DIAGNOSIS — N186 End stage renal disease: Secondary | ICD-10-CM | POA: Diagnosis not present

## 2022-01-23 DIAGNOSIS — C3490 Malignant neoplasm of unspecified part of unspecified bronchus or lung: Secondary | ICD-10-CM | POA: Diagnosis not present

## 2022-01-23 DIAGNOSIS — Z79899 Other long term (current) drug therapy: Secondary | ICD-10-CM | POA: Diagnosis not present

## 2022-01-23 DIAGNOSIS — Z923 Personal history of irradiation: Secondary | ICD-10-CM | POA: Diagnosis not present

## 2022-01-23 DIAGNOSIS — Z992 Dependence on renal dialysis: Secondary | ICD-10-CM | POA: Diagnosis not present

## 2022-01-23 DIAGNOSIS — Z8719 Personal history of other diseases of the digestive system: Secondary | ICD-10-CM | POA: Diagnosis not present

## 2022-01-23 DIAGNOSIS — Z880 Allergy status to penicillin: Secondary | ICD-10-CM | POA: Diagnosis not present

## 2022-01-23 DIAGNOSIS — J811 Chronic pulmonary edema: Secondary | ICD-10-CM | POA: Diagnosis not present

## 2022-01-23 DIAGNOSIS — D61818 Other pancytopenia: Secondary | ICD-10-CM | POA: Diagnosis not present

## 2022-01-23 DIAGNOSIS — C3411 Malignant neoplasm of upper lobe, right bronchus or lung: Secondary | ICD-10-CM | POA: Diagnosis not present

## 2022-01-23 DIAGNOSIS — N6342 Unspecified lump in left breast, subareolar: Secondary | ICD-10-CM | POA: Diagnosis not present

## 2022-01-23 DIAGNOSIS — K439 Ventral hernia without obstruction or gangrene: Secondary | ICD-10-CM | POA: Diagnosis not present

## 2022-01-23 DIAGNOSIS — I5021 Acute systolic (congestive) heart failure: Secondary | ICD-10-CM | POA: Diagnosis not present

## 2022-01-23 DIAGNOSIS — Z7901 Long term (current) use of anticoagulants: Secondary | ICD-10-CM | POA: Diagnosis not present

## 2022-01-23 DIAGNOSIS — Z86010 Personal history of colonic polyps: Secondary | ICD-10-CM | POA: Diagnosis not present

## 2022-01-23 DIAGNOSIS — J90 Pleural effusion, not elsewhere classified: Secondary | ICD-10-CM | POA: Diagnosis not present

## 2022-01-23 DIAGNOSIS — I1311 Hypertensive heart and chronic kidney disease without heart failure, with stage 5 chronic kidney disease, or end stage renal disease: Secondary | ICD-10-CM | POA: Diagnosis not present

## 2022-01-23 NOTE — Progress Notes (Signed)
[place] Telephone:([phone]) [phone]   Fax:(336) [phone]  OFFICE PROGRESS NOTE  [name], MD [address] [place] [address]  DIAGNOSIS: Limited stage (T2b, N1, M0) small cell lung cancer presented with right upper lobe with invasion of superior segment of right lower lobe as well as right hilar lymphadenopathy diagnosed in March 2020.  PRIOR THERAPY:  Systemic chemotherapy with carboplatin for AUC of 5 on day 1 and etoposide 100 mg/M2 on days 1, 2 and 3 every 3 weeks.  Status post 4 cycles.  CURRENT THERAPY: Observation.  INTERVAL HISTORY: Jeremy Johnson 76 y.o. male returns to the clinic today for follow-up visit accompanied by his wife.  The patient is feeling fine today with no concerning complaints.  He had left arm AV fistula performed recently for the dialysis.  He denied having any current chest pain, shortness of breath, cough or hemoptysis.  He denied having any fever or chills.  He has no nausea, vomiting, diarrhea or constipation.  He has no headache or visual changes.  He denied having any weight loss or night sweats.  He is here today for evaluation with repeat CT scan of the chest for restaging of his disease.   MEDICAL HISTORY: Past Medical History:  Diagnosis Date   AAA (abdominal aortic aneurysm)    3.2 cm by 2020 CT; recheck in 3 years recommended   Anemia    Blood transfusion without reported diagnosis    CAD (coronary artery disease)    Stent to [place]; DES x2 to CX 2021   Chronic kidney disease    ESRD on HD   COPD (chronic obstructive pulmonary disease) (HCC)    Degenerative joint disease (DJD) of lumbar spine    GERD (gastroesophageal reflux disease)    Gout    Hyperlipidemia    Hypertension    Hypothyroidism    Incisional hernia    abdomen   Leukocytosis    CHRONIC MILD   Myocardial infarction ([place])    1997   SCL CA dx'd 01/2019   Lung cancer   Thoracic aortic aneurysm    4.5cm aortic arch by 07/20/21 CT     ALLERGIES:  is allergic to advair hfa [fluticasone-salmeterol] and penicillins.  MEDICATIONS:  Current Outpatient Medications  Medication Sig Dispense Refill   acetaminophen (TYLENOL) 325 MG tablet Take 2 tablets (650 mg total) by mouth every 6 (six) hours as needed for mild pain (or Fever >/= 101). (Patient taking differently: Take 650 mg by mouth every 6 (six) hours as needed for mild pain or headache (or Fever >/= 101).) 30 tablet 0   allopurinol (ZYLOPRIM) 100 MG tablet Take 1 tablet (100 mg total) by mouth daily. 30 tablet 1   amiodarone (PACERONE) 200 MG tablet TAKE 1 TABLET BY MOUTH  DAILY (Patient taking differently: Take 200 mg by mouth daily.) 90 tablet 3   apixaban (ELIQUIS) 5 MG TABS tablet Take 1 tablet (5 mg total) by mouth 2 (two) times daily. 60 tablet 5   atorvastatin (LIPITOR) 40 MG tablet TAKE 1 TABLET BY MOUTH  DAILY (Patient taking differently: Take 40 mg by mouth daily.) 90 tablet 3   B Complex-C-Zn-Folic Acid (DIALYVITE 462-VOJJ 15) 0.8 MG TABS Take 1 tablet by mouth daily.     HYDROcodone-acetaminophen (NORCO/VICODIN) 5-325 MG tablet Take 1 tablet by mouth every 6 (six) hours as needed for moderate pain. 30 tablet 0   levothyroxine (SYNTHROID, LEVOTHROID) 175 MCG tablet Take 175  mcg by mouth daily before breakfast.      lidocaine-prilocaine (EMLA) cream Apply 1 application topically Every Tuesday,Thursday,and Saturday with dialysis.     metoprolol succinate (TOPROL-XL) 25 MG 24 hr tablet Take 0.5 tablets (12.5 mg total) by mouth daily. 45 tablet 3   omeprazole (PRILOSEC) 40 MG capsule Take 1 capsule (40 mg total) by mouth in the morning and at bedtime. (Patient taking differently: Take 40 mg by mouth daily.) 60 capsule 5   sacubitril-valsartan (ENTRESTO) 49-51 MG Take 1 tablet by mouth 2 (two) times daily. 60 tablet 5   sulfamethoxazole-trimethoprim (BACTRIM) 400-80 MG tablet Take 1 tablet by mouth 2 (two) times daily. 20 tablet 0   No current facility-administered  medications for this visit.    SURGICAL HISTORY:  Past Surgical History:  Procedure Laterality Date   ABDOMINAL AORTIC ANEURYSM REPAIR  2006   AV FISTULA PLACEMENT Left 04/25/2019   Procedure: ARTERIOVENOUS (AV) FISTULA CREATION LEFT ARM;  Surgeon: Angelia Mould, MD;  Location: [facility];  Service: Vascular;  Laterality: Left;   AV FISTULA PLACEMENT Left 05/19/2019   Procedure: CONVERSION OF LEFT ARM ARTERIOVENOUS FISTULA TO GRAFT;  Surgeon: Angelia Mould, MD;  Location: [facility];  Service: Vascular;  Laterality: Left;   AV FISTULA PLACEMENT Left 01/03/2022   Procedure: Left arm arteriovenous graft revision;  Surgeon: Waynetta Sandy, MD;  Location: [facility];  Service: Vascular;  Laterality: Left;   BIOPSY  09/30/2018   Procedure: BIOPSY;  Surgeon: Danie Binder, MD;  [facility];  Service: Endoscopy;;  ascending colon   BIOPSY  07/27/2020   Procedure: BIOPSY;  Surgeon: Eloise Harman, DO;  [facility];  Service: Endoscopy;;  gastric   BUBBLE STUDY  09/23/2020   Procedure: BUBBLE STUDY;  Surgeon: Skeet Latch, MD;  Location: [facility];  Service: Cardiovascular;;   CARDIOVERSION N/A 09/23/2020   Procedure: CARDIOVERSION;  Surgeon: Skeet Latch, MD;  Location: [facility];  Service: Cardiovascular;  Laterality: N/A;   COLONOSCOPY  2008   COLONOSCOPY N/A 09/30/2018   External and internal hemorrhoids, six polyps removed, one ascending colon polypoid lesion biopsied. Six simple adenomas and one benign polypoid lesion. Colonoscopy Nov 2022.    COLONOSCOPY WITH PROPOFOL N/A 07/27/2020   non-bleeding internal hemorrhoids, sigmoid and descending colon diverticulosis, four 1-2 mm polyps in ascending colon, one 5 mm polyp in transverse colon. 3 year surveillance. Tubular adenomas.    CORONARY ANGIOPLASTY WITH STENT PLACEMENT  1997   MID CIRCUMFLEX   CORONARY STENT INTERVENTION N/A 09/20/2020   Procedure: CORONARY STENT INTERVENTION;   Surgeon: Martinique, Peter M, MD;  Location: [facility];  Service: Cardiovascular;  Laterality: N/A;   CYSTOSCOPY N/A 08/29/2021   Procedure: CYSTOSCOPY;  Surgeon: Cleon Gustin, MD;  [facility];  Service: Urology;  Laterality: N/A;   CYSTOSCOPY W/ RETROGRADES Bilateral 03/28/2021   Procedure: CYSTOSCOPY WITH RETROGRADE PYELOGRAM;  Surgeon: Cleon Gustin, MD;  [facility]: [facility];  Service: Urology;  Laterality: Bilateral;   ESOPHAGOGASTRODUODENOSCOPY (EGD) WITH PROPOFOL N/A 07/27/2020   Food in middle third of esophagus, gastritis s/p biopsy, nodular mucosa in lesser curvature of stomach s/p biopsy. Negative H.pylori.    HIP ARTHROPLASTY Right 04/30/2020   Procedure: ARTHROPLASTY  HIP (HEMIARTHROPLASTY);  Surgeon: Altamese Flint Hill, MD;  Location: [facility];  Service: Orthopedics;  Laterality: Right;   INSERTION OF DIALYSIS CATHETER Right 01/03/2022   Procedure: INSERTION OF DIALYSIS CATHETER;  Surgeon: Waynetta Sandy, MD;  Location: [facility];  Service: Vascular;  Laterality: Right;   INTRAVASCULAR ULTRASOUND/IVUS N/A 09/20/2020   Procedure: Intravascular Ultrasound/IVUS;  Surgeon: Martinique, Peter M, MD;  Location: [facility];  Service: Cardiovascular;  Laterality: N/A;   IR FLUORO GUIDE CV LINE RIGHT  04/22/2019   IR FLUORO GUIDE CV LINE RIGHT  05/01/2020   IR THORACENTESIS ASP PLEURAL SPACE W/IMG GUIDE  09/22/2020   IR THROMBECTOMY AV FISTULA W/THROMBOLYSIS/PTA INC/SHUNT/IMG LEFT Left 05/03/2020   IR US GUIDE VASC ACCESS LEFT  05/03/2020   IR US GUIDE VASC ACCESS RIGHT  04/22/2019   IR US GUIDE VASC ACCESS RIGHT  05/01/2020   JOINT REPLACEMENT     Right   POLYPECTOMY  09/30/2018   Procedure: POLYPECTOMY;  Surgeon: Danie Binder, MD;  [facility];  Service: Endoscopy;;  colon   POLYPECTOMY  07/27/2020   Procedure: POLYPECTOMY;  Surgeon: Eloise Harman, DO;  [facility];  Service: Endoscopy;;   REVISION OF ARTERIOVENOUS GORETEX GRAFT  Left 08/18/2021   Procedure: REVISION OF LEFT ARTERIOVENOUS GORETEX GRAFT;  Surgeon: Serafina Mitchell, MD;  [facility];  Service: Vascular;  Laterality: Left;  PERIPHERAL NERVE BLOCK   RIGHT/LEFT HEART CATH AND CORONARY ANGIOGRAPHY N/A 09/20/2020   Procedure: RIGHT/LEFT HEART CATH AND CORONARY ANGIOGRAPHY;  Surgeon: Martinique, Peter M, MD;  Location: [facility];  Service: Cardiovascular;  Laterality: N/A;   TEE WITHOUT CARDIOVERSION N/A 09/23/2020   Procedure: TRANSESOPHAGEAL ECHOCARDIOGRAM (TEE);  Surgeon: Skeet Latch, MD;  Location: [facility];  Service: Cardiovascular;  Laterality: N/A;   TRANSURETHRAL RESECTION OF BLADDER TUMOR N/A 03/28/2021   Procedure: TRANSURETHRAL RESECTION OF BLADDER TUMOR (TURBT);  Surgeon: Cleon Gustin, MD;  [facility];  Service: Urology;  Laterality: N/A;   TRANSURETHRAL RESECTION OF BLADDER TUMOR N/A 08/29/2021   Procedure: TRANSURETHRAL RESECTION OF BLADDER TUMOR (TURBT);  Surgeon: Cleon Gustin, MD;  [facility];  Service: Urology;  Laterality: N/A;   VIDEO BRONCHOSCOPY WITH ENDOBRONCHIAL NAVIGATION N/A 01/27/2019   Procedure: VIDEO BRONCHOSCOPY WITH ENDOBRONCHIAL NAVIGATION;  Surgeon: Grace Isaac, MD;  Location: [facility];  Service: Thoracic;  Laterality: N/A;   VIDEO BRONCHOSCOPY WITH ENDOBRONCHIAL ULTRASOUND N/A 01/27/2019   Procedure: VIDEO BRONCHOSCOPY WITH ENDOBRONCHIAL ULTRASOUND;  Surgeon: Grace Isaac, MD;  Location: [facility];  Service: Thoracic;  Laterality: N/A;    REVIEW OF SYSTEMS:  A comprehensive review of systems was negative except for: Constitutional: positive for fatigue   PHYSICAL EXAMINATION: General appearance: alert, cooperative, fatigued, and no distress Head: Normocephalic, without obvious abnormality, atraumatic Neck: no adenopathy, no JVD, supple, symmetrical, trachea midline, and thyroid not enlarged, symmetric, no tenderness/mass/nodules Lymph nodes: Cervical, supraclavicular, and  axillary nodes normal. Resp: clear to auscultation bilaterally Back: symmetric, no curvature. ROM normal. No CVA tenderness. Cardio: regular rate and rhythm, S1, S2 normal, no murmur, click, rub or gallop GI: soft, non-tender; bowel sounds normal; no masses,  no organomegaly Extremities: extremities normal, atraumatic, no cyanosis or edema  ECOG PERFORMANCE STATUS: 1 - Symptomatic but completely ambulatory  Blood pressure (!) 149/79, pulse 70, temperature 97.8 F (36.6 C), temperature source Tympanic, resp. rate 16, weight 161 lb 8 oz (73.3 kg), SpO2 98 %.  LABORATORY DATA: Lab Results  Component Value Date   WBC 8.9 01/16/2022   HGB 8.1 (L) 01/16/2022   HCT 23.4 (L) 01/16/2022   MCV 96.7 01/16/2022   PLT 161 01/16/2022      Chemistry      Component Value  Date/Time   NA 135 01/16/2022 0917   K 3.3 (L) 01/16/2022 0917   CL 94 (L) 01/16/2022 0917   CO2 33 (H) 01/16/2022 0917   BUN 23 01/16/2022 0917   CREATININE 7.16 (HH) 01/16/2022 0917      Component Value Date/Time   CALCIUM 8.7 (L) 01/16/2022 0917   ALKPHOS 83 01/16/2022 0917   AST 26 01/16/2022 0917   ALT 16 01/16/2022 0917   BILITOT 0.4 01/16/2022 0917       RADIOGRAPHIC STUDIES: CT Chest Wo Contrast  Result Date: 01/16/2022 CLINICAL DATA:  Restaging non-small cell right upper lobe lung cancer diagnosed in March 2020. Radiation therapy completed. * onc * EXAM: CT CHEST WITHOUT CONTRAST TECHNIQUE: Multidetector CT imaging of the chest was performed following the standard protocol without IV contrast. RADIATION DOSE REDUCTION: This exam was performed according to the departmental dose-optimization program which includes automated exposure control, adjustment of the mA and/or kV according to patient size and/or use of iterative reconstruction technique. COMPARISON:  CT chest 07/20/2021 and 01/17/2021 FINDINGS: Cardiovascular: Again demonstrated is severe atherosclerosis of the aorta, great vessels and coronary arteries.  There is stable aneurysmal dilatation of the proximal aortic arch which measures up to 4.5 cm transverse on image 46/2. Right IJ hemodialysis catheter extends to the lower SVC. Stable mild cardiomegaly. No significant pericardial fluid. Mediastinum/Nodes: There are no enlarged mediastinal, hilar or axillary lymph nodes.Hilar assessment is limited by the lack of intravenous contrast, although the hilar contours appear unchanged. The thyroid gland, trachea and esophagus demonstrate no significant findings. Lungs/Pleura: A partially loculated small left pleural effusion is similar in overall volume, although there is a new component medially in the upper left chest. No discrete pleural based mass identified. Minimal right pleural fluid with a new nodular density along the posterior aspect of the major fissure measuring up to 2.5 x 1.9 cm on image 68/5. This appears triangular on the sagittal images and may reflect loculated pleural fluid. Underlying severe centrilobular paraseptal emphysema with diffuse central airway thickening. Stable radiation changes in the left perihilar region with associated volume loss and architectural distortion. The lungs are otherwise unchanged, without additional enlarging nodules. Upper abdomen: The visualized upper abdomen appears stable without suspicious findings. Chronic left renal cortical thinning. A ventral abdominal wall hernia is partially imaged, grossly stable. Musculoskeletal/Chest wall: Progressive enlargement of an asymmetric subareolar mass in the left breast, measuring up to 4.7 x 1.4 cm on image 89/2. No other chest wall masses identified. No acute or suspicious osseous findings. IMPRESSION: 1. New nodular subpleural density along the posterior aspect of the right major fissure appears triangular on the reformatted images and is favored to reflect loculated pleural fluid. Attention on short-term follow-up recommended. 2. The appearance of the left hemithorax is grossly  stable aside from minimally increased pleural fluid medial to the left upper lobe. No local recurrence or metastases identified. 3. Progressive enlargement of an asymmetric subareolar density in the left breast, probably asymmetric gynecomastia. Correlate with physical examination and mammography/ultrasound as clinically warranted to exclude neoplasm. 4. Coronary and aortic atherosclerosis (ICD10-I70.0). Emphysema (ICD10-J43.9). Electronically Signed   By: Richardean Sale M.D.   On: 01/16/2022 13:31   DG CHEST PORT 1 VIEW  Result Date: 01/03/2022 CLINICAL DATA:  Tunneled hemodialysis catheter placement EXAM: PORTABLE CHEST 1 VIEW COMPARISON:  CT scan of the chest 07/20/2021 FINDINGS: Tunneled hemodialysis catheter in place. Catheter tip overlies the distal SVC. Cardiomegaly. Atherosclerotic calcifications throughout the thoracic aorta. Moderately large layering left pleural  effusion. Mild pulmonary edema. No evidence of pneumothorax. No acute osseous abnormality. IMPRESSION: 1. New right IJ tunneled hemodialysis catheter with the catheter tip overlying the distal SVC. 2. No evidence of pneumothorax. 3. Low lung volumes with pulmonary edema and a moderately large layering left pleural effusion. 4. Stable cardiomegaly and aortic atherosclerotic calcifications. Electronically Signed   By: Jacqulynn Cadet M.D.   On: 01/03/2022 13:50   DG C-Arm 1-60 Min-No Report  Result Date: 01/03/2022 Fluoroscopy was utilized by the requesting physician.  No radiographic interpretation.     ASSESSMENT AND PLAN: This is a very pleasant 76 years old white male with limited stage small cell lung cancer and currently undergoing systemic chemotherapy with carboplatin and etoposide status post 4 cycles. The patient tolerated his treatment well except for the pancytopenia that was complicated by his end-stage renal disease. The patient has been on observation for several years now and he is doing fine. He had repeat CT scan  of the chest without contrast performed recently.  I personally and independently reviewed the scan images and discussed the result and showed the images to the patient and his wife today. His scan showed no concerning findings for disease recurrence but there was new nodular subpleural density along the posterior aspect of the right major fissure triangular in shape and favored a loculated pleural fluid. I recommended for the patient to continue on observation but we will repeat CT scan of the chest in around 3 months for evaluation of this area and to rule out any disease recurrence. The patient was advised to call immediately if he has any other concerning symptoms in the interval. For the end-stage renal disease, he is currently on hemodialysis.  The patient voices understanding of current disease status and treatment options and is in agreement with the current care plan.  All questions were answered. The patient knows to call the clinic with any problems, questions or concerns. We can certainly see the patient much sooner if necessary.  Disclaimer: This note was dictated with voice recognition software. Similar sounding words can inadvertently be transcribed and may not be corrected upon review.

## 2022-01-24 DIAGNOSIS — D689 Coagulation defect, unspecified: Secondary | ICD-10-CM | POA: Diagnosis not present

## 2022-01-24 DIAGNOSIS — D631 Anemia in chronic kidney disease: Secondary | ICD-10-CM | POA: Diagnosis not present

## 2022-01-24 DIAGNOSIS — N186 End stage renal disease: Secondary | ICD-10-CM | POA: Diagnosis not present

## 2022-01-24 DIAGNOSIS — Z992 Dependence on renal dialysis: Secondary | ICD-10-CM | POA: Diagnosis not present

## 2022-01-24 DIAGNOSIS — N2581 Secondary hyperparathyroidism of renal origin: Secondary | ICD-10-CM | POA: Diagnosis not present

## 2022-01-24 DIAGNOSIS — E876 Hypokalemia: Secondary | ICD-10-CM | POA: Diagnosis not present

## 2022-01-25 ENCOUNTER — Other Ambulatory Visit: Payer: Self-pay

## 2022-01-25 DIAGNOSIS — Z7901 Long term (current) use of anticoagulants: Secondary | ICD-10-CM | POA: Diagnosis not present

## 2022-01-25 DIAGNOSIS — N186 End stage renal disease: Secondary | ICD-10-CM | POA: Diagnosis not present

## 2022-01-25 DIAGNOSIS — Z992 Dependence on renal dialysis: Secondary | ICD-10-CM | POA: Diagnosis not present

## 2022-01-25 DIAGNOSIS — Z95828 Presence of other vascular implants and grafts: Secondary | ICD-10-CM | POA: Diagnosis not present

## 2022-01-25 DIAGNOSIS — Z48817 Encounter for surgical aftercare following surgery on the skin and subcutaneous tissue: Secondary | ICD-10-CM | POA: Diagnosis not present

## 2022-01-26 DIAGNOSIS — N2581 Secondary hyperparathyroidism of renal origin: Secondary | ICD-10-CM | POA: Diagnosis not present

## 2022-01-26 DIAGNOSIS — Z992 Dependence on renal dialysis: Secondary | ICD-10-CM | POA: Diagnosis not present

## 2022-01-26 DIAGNOSIS — N186 End stage renal disease: Secondary | ICD-10-CM | POA: Diagnosis not present

## 2022-01-26 DIAGNOSIS — D689 Coagulation defect, unspecified: Secondary | ICD-10-CM | POA: Diagnosis not present

## 2022-01-26 DIAGNOSIS — E876 Hypokalemia: Secondary | ICD-10-CM | POA: Diagnosis not present

## 2022-01-26 DIAGNOSIS — D631 Anemia in chronic kidney disease: Secondary | ICD-10-CM | POA: Diagnosis not present

## 2022-01-28 DIAGNOSIS — D631 Anemia in chronic kidney disease: Secondary | ICD-10-CM | POA: Diagnosis not present

## 2022-01-28 DIAGNOSIS — E876 Hypokalemia: Secondary | ICD-10-CM | POA: Diagnosis not present

## 2022-01-28 DIAGNOSIS — N2581 Secondary hyperparathyroidism of renal origin: Secondary | ICD-10-CM | POA: Diagnosis not present

## 2022-01-28 DIAGNOSIS — Z992 Dependence on renal dialysis: Secondary | ICD-10-CM | POA: Diagnosis not present

## 2022-01-28 DIAGNOSIS — N186 End stage renal disease: Secondary | ICD-10-CM | POA: Diagnosis not present

## 2022-01-28 DIAGNOSIS — D689 Coagulation defect, unspecified: Secondary | ICD-10-CM | POA: Diagnosis not present

## 2022-01-29 ENCOUNTER — Other Ambulatory Visit: Payer: Self-pay | Admitting: Cardiology

## 2022-01-30 NOTE — Telephone Encounter (Signed)
Prescription refill request for Eliquis received. ?Indication: Atrial flutter ?Last office visit: 12/09/21 ?Scr: 7.16 on 01/16/22 ?Age: 76 ?Weight: 72.1kg ? ?

## 2022-01-31 DIAGNOSIS — Z992 Dependence on renal dialysis: Secondary | ICD-10-CM | POA: Diagnosis not present

## 2022-01-31 DIAGNOSIS — D689 Coagulation defect, unspecified: Secondary | ICD-10-CM | POA: Diagnosis not present

## 2022-01-31 DIAGNOSIS — D631 Anemia in chronic kidney disease: Secondary | ICD-10-CM | POA: Diagnosis not present

## 2022-01-31 DIAGNOSIS — N2581 Secondary hyperparathyroidism of renal origin: Secondary | ICD-10-CM | POA: Diagnosis not present

## 2022-01-31 DIAGNOSIS — N186 End stage renal disease: Secondary | ICD-10-CM | POA: Diagnosis not present

## 2022-01-31 DIAGNOSIS — E876 Hypokalemia: Secondary | ICD-10-CM | POA: Diagnosis not present

## 2022-02-02 DIAGNOSIS — E039 Hypothyroidism, unspecified: Secondary | ICD-10-CM | POA: Diagnosis not present

## 2022-02-02 DIAGNOSIS — D631 Anemia in chronic kidney disease: Secondary | ICD-10-CM | POA: Diagnosis not present

## 2022-02-02 DIAGNOSIS — N186 End stage renal disease: Secondary | ICD-10-CM | POA: Diagnosis not present

## 2022-02-02 DIAGNOSIS — I5022 Chronic systolic (congestive) heart failure: Secondary | ICD-10-CM | POA: Diagnosis not present

## 2022-02-02 DIAGNOSIS — Z992 Dependence on renal dialysis: Secondary | ICD-10-CM | POA: Diagnosis not present

## 2022-02-02 DIAGNOSIS — N2581 Secondary hyperparathyroidism of renal origin: Secondary | ICD-10-CM | POA: Diagnosis not present

## 2022-02-02 DIAGNOSIS — D689 Coagulation defect, unspecified: Secondary | ICD-10-CM | POA: Diagnosis not present

## 2022-02-02 DIAGNOSIS — C3491 Malignant neoplasm of unspecified part of right bronchus or lung: Secondary | ICD-10-CM | POA: Diagnosis not present

## 2022-02-02 DIAGNOSIS — E876 Hypokalemia: Secondary | ICD-10-CM | POA: Diagnosis not present

## 2022-02-03 DIAGNOSIS — Z992 Dependence on renal dialysis: Secondary | ICD-10-CM | POA: Diagnosis not present

## 2022-02-03 DIAGNOSIS — Z7901 Long term (current) use of anticoagulants: Secondary | ICD-10-CM | POA: Diagnosis not present

## 2022-02-03 DIAGNOSIS — N186 End stage renal disease: Secondary | ICD-10-CM | POA: Diagnosis not present

## 2022-02-03 DIAGNOSIS — Z95828 Presence of other vascular implants and grafts: Secondary | ICD-10-CM | POA: Diagnosis not present

## 2022-02-03 DIAGNOSIS — Z48817 Encounter for surgical aftercare following surgery on the skin and subcutaneous tissue: Secondary | ICD-10-CM | POA: Diagnosis not present

## 2022-02-04 DIAGNOSIS — Z992 Dependence on renal dialysis: Secondary | ICD-10-CM | POA: Diagnosis not present

## 2022-02-04 DIAGNOSIS — E876 Hypokalemia: Secondary | ICD-10-CM | POA: Diagnosis not present

## 2022-02-04 DIAGNOSIS — D689 Coagulation defect, unspecified: Secondary | ICD-10-CM | POA: Diagnosis not present

## 2022-02-04 DIAGNOSIS — N186 End stage renal disease: Secondary | ICD-10-CM | POA: Diagnosis not present

## 2022-02-04 DIAGNOSIS — D631 Anemia in chronic kidney disease: Secondary | ICD-10-CM | POA: Diagnosis not present

## 2022-02-04 DIAGNOSIS — N2581 Secondary hyperparathyroidism of renal origin: Secondary | ICD-10-CM | POA: Diagnosis not present

## 2022-02-06 ENCOUNTER — Ambulatory Visit (HOSPITAL_COMMUNITY)
Admission: RE | Disposition: A | Discharge status: Home / Self Care | Payer: Self-pay | Source: Ambulatory Visit | Attending: Vascular Surgery

## 2022-02-06 ENCOUNTER — Encounter (HOSPITAL_COMMUNITY): Payer: Self-pay | Admitting: Vascular Surgery

## 2022-02-06 ENCOUNTER — Ambulatory Visit (HOSPITAL_COMMUNITY)
Admission: RE | Admit: 2022-02-06 | Discharge: 2022-02-06 | Disposition: A | Discharge status: Home / Self Care | Payer: Medicare Other | Source: Ambulatory Visit | Attending: Vascular Surgery | Admitting: Vascular Surgery

## 2022-02-06 ENCOUNTER — Other Ambulatory Visit: Payer: Self-pay

## 2022-02-06 DIAGNOSIS — T82898A Other specified complication of vascular prosthetic devices, implants and grafts, initial encounter: Secondary | ICD-10-CM | POA: Diagnosis not present

## 2022-02-06 DIAGNOSIS — Z992 Dependence on renal dialysis: Secondary | ICD-10-CM | POA: Insufficient documentation

## 2022-02-06 DIAGNOSIS — Z7901 Long term (current) use of anticoagulants: Secondary | ICD-10-CM | POA: Insufficient documentation

## 2022-02-06 DIAGNOSIS — Z79899 Other long term (current) drug therapy: Secondary | ICD-10-CM | POA: Insufficient documentation

## 2022-02-06 DIAGNOSIS — N185 Chronic kidney disease, stage 5: Secondary | ICD-10-CM | POA: Diagnosis not present

## 2022-02-06 DIAGNOSIS — N186 End stage renal disease: Secondary | ICD-10-CM | POA: Diagnosis not present

## 2022-02-06 DIAGNOSIS — Z79890 Hormone replacement therapy (postmenopausal): Secondary | ICD-10-CM | POA: Insufficient documentation

## 2022-02-06 HISTORY — PX: A/V SHUNTOGRAM: CATH118297

## 2022-02-06 LAB — POCT I-STAT, CHEM 8
BUN: 21 mg/dL (ref 8–23)
Calcium, Ion: 1.14 mmol/L — ABNORMAL LOW (ref 1.15–1.40)
Chloride: 96 mmol/L — ABNORMAL LOW (ref 98–111)
Creatinine, Ser: 7.7 mg/dL — ABNORMAL HIGH (ref 0.61–1.24)
Glucose, Bld: 74 mg/dL (ref 70–99)
HCT: 26 % — ABNORMAL LOW (ref 39.0–52.0)
Hemoglobin: 8.8 g/dL — ABNORMAL LOW (ref 13.0–17.0)
Potassium: 3.7 mmol/L (ref 3.5–5.1)
Sodium: 136 mmol/L (ref 135–145)
TCO2: 29 mmol/L (ref 22–32)

## 2022-02-06 SURGERY — A/V SHUNTOGRAM
Anesthesia: LOCAL | Laterality: Left

## 2022-02-06 MED ORDER — SODIUM CHLORIDE 0.9% FLUSH: 3.0000 mL | Freq: Two times a day (BID) | INTRAVENOUS | Status: DC

## 2022-02-06 MED ORDER — LIDOCAINE HCL (PF) 1 % IJ SOLN
INTRAMUSCULAR | Status: AC
  Filled 2022-02-06: qty 30

## 2022-02-06 MED ORDER — SODIUM CHLORIDE 0.9 % IV SOLN: 250.0000 mL | INTRAVENOUS | Status: DC | PRN

## 2022-02-06 MED ORDER — IODIXANOL 320 MG/ML IV SOLN
INTRAVENOUS | Status: DC | PRN
Start: 2022-02-06 — End: 2022-02-06
  Administered 2022-02-06: 60 mL

## 2022-02-06 MED ORDER — HEPARIN (PORCINE) IN NACL 1000-0.9 UT/500ML-% IV SOLN
INTRAVENOUS | Status: DC | PRN
  Administered 2022-02-06: 500 mL

## 2022-02-06 MED ORDER — HEPARIN (PORCINE) IN NACL 1000-0.9 UT/500ML-% IV SOLN
INTRAVENOUS | Status: AC
  Filled 2022-02-06: qty 500

## 2022-02-06 MED ORDER — SODIUM CHLORIDE 0.9% FLUSH: 3.0000 mL | INTRAVENOUS | Status: DC | PRN

## 2022-02-06 SURGICAL SUPPLY — 9 items

## 2022-02-06 NOTE — Op Note (Signed)
? ? ?  Patient name: Jeremy Johnson MRN: [id number] DOB: Sep 04, 1946 Sex: male ? ?02/06/2022 ?Pre-operative Diagnosis: End-stage renal disease ?Post-operative diagnosis:  Same ?Surgeon:  Eda Paschal. Donzetta Matters, MD ?Procedure Performed: ?1.  Ultrasound-guided cannulation left arm AV graft ?2.  Left upper extremity fistulogram ? ? ?Indications: 76 year old male with history of end-stage renal disease currently dialyzing via catheter.  He has recently had a left arm AV graft revised and now has swelling of the left arm.  He is indicated for fistulogram with possible intervention. ? ?Findings: There was some hematoma around the proximal aspect of the graft.  The graft itself was free of disease.  It does tie into a very large vein.  Proximally there is a very large brachial artery.  There is no flow-limiting stenosis throughout the graft or in the central veins to suggest any reason for his swelling other than postsurgical hematoma. ?  ?Procedure:  The patient was identified in the holding area and taken to room 7.  The patient was then placed supine on the table and prepped and draped in the usual sterile fashion.  A time out was called.  Ultrasound was used to evaluate the left arm AV graft.  There is no spasm percent lidocaine cannulated micropuncture needle followed by wire and sheath.  And images saved per record.  Left upper extremity fistulogram was performed including retrograde views.  With the above findings I elected no intervention.  The sheath was removed and the cannulation site sutured with 4 Monocryl in a figure-of-eight fashion.  He tolerated procedure without any complication. ? ?Contrast: 60 cc ? ? ? ?Jose Corvin C. Donzetta Matters, MD ?[facility] ?Office: [phone] ?Pager: [phone] ? ? ?

## 2022-02-06 NOTE — Interval H&P Note (Signed)
History and Physical Interval Note: ? ?02/06/2022 ?8:35 AM ? ?Jeremy Johnson  has presented today for surgery, with the diagnosis of end stage renal.  The various methods of treatment have been discussed with the patient and family. After consideration of risks, benefits and other options for treatment, the patient has consented to  Procedure(s): ?A/V SHUNTOGRAM (Left) as a surgical intervention.  The patient's history has been reviewed, patient examined, no change in status, stable for surgery.  I have reviewed the patient's chart and labs.  Questions were answered to the patient's satisfaction.   ? ? ?Servando Snare ? ? ?

## 2022-02-06 NOTE — Discharge Instructions (Signed)
1.) Keep bandage on for 24 hours and then you can shower. ?2.) Watch for signs of infection (fever, redness, drainage, etc.) and bleeding. If so, call MD.  ?3.) No heavy lifting (more than 10 pounds) or submerging/ soaking arm in water for one week.  ?

## 2022-02-07 DIAGNOSIS — N186 End stage renal disease: Secondary | ICD-10-CM | POA: Diagnosis not present

## 2022-02-07 DIAGNOSIS — N2581 Secondary hyperparathyroidism of renal origin: Secondary | ICD-10-CM | POA: Diagnosis not present

## 2022-02-07 DIAGNOSIS — Z992 Dependence on renal dialysis: Secondary | ICD-10-CM | POA: Diagnosis not present

## 2022-02-07 DIAGNOSIS — D631 Anemia in chronic kidney disease: Secondary | ICD-10-CM | POA: Diagnosis not present

## 2022-02-07 DIAGNOSIS — E876 Hypokalemia: Secondary | ICD-10-CM | POA: Diagnosis not present

## 2022-02-07 DIAGNOSIS — D689 Coagulation defect, unspecified: Secondary | ICD-10-CM | POA: Diagnosis not present

## 2022-02-07 MED FILL — Lidocaine HCl Local Preservative Free (PF) Inj 1%: INTRAMUSCULAR | Qty: 30 | Status: AC

## 2022-02-08 DIAGNOSIS — Z95828 Presence of other vascular implants and grafts: Secondary | ICD-10-CM | POA: Diagnosis not present

## 2022-02-08 DIAGNOSIS — N186 End stage renal disease: Secondary | ICD-10-CM | POA: Diagnosis not present

## 2022-02-08 DIAGNOSIS — Z48817 Encounter for surgical aftercare following surgery on the skin and subcutaneous tissue: Secondary | ICD-10-CM | POA: Diagnosis not present

## 2022-02-08 DIAGNOSIS — Z992 Dependence on renal dialysis: Secondary | ICD-10-CM | POA: Diagnosis not present

## 2022-02-08 DIAGNOSIS — Z7901 Long term (current) use of anticoagulants: Secondary | ICD-10-CM | POA: Diagnosis not present

## 2022-02-09 DIAGNOSIS — N2581 Secondary hyperparathyroidism of renal origin: Secondary | ICD-10-CM | POA: Diagnosis not present

## 2022-02-09 DIAGNOSIS — E876 Hypokalemia: Secondary | ICD-10-CM | POA: Diagnosis not present

## 2022-02-09 DIAGNOSIS — D631 Anemia in chronic kidney disease: Secondary | ICD-10-CM | POA: Diagnosis not present

## 2022-02-09 DIAGNOSIS — Z992 Dependence on renal dialysis: Secondary | ICD-10-CM | POA: Diagnosis not present

## 2022-02-09 DIAGNOSIS — N186 End stage renal disease: Secondary | ICD-10-CM | POA: Diagnosis not present

## 2022-02-09 DIAGNOSIS — D689 Coagulation defect, unspecified: Secondary | ICD-10-CM | POA: Diagnosis not present

## 2022-02-10 DIAGNOSIS — E785 Hyperlipidemia, unspecified: Secondary | ICD-10-CM | POA: Diagnosis not present

## 2022-02-10 DIAGNOSIS — I129 Hypertensive chronic kidney disease with stage 1 through stage 4 chronic kidney disease, or unspecified chronic kidney disease: Secondary | ICD-10-CM | POA: Diagnosis not present

## 2022-02-10 DIAGNOSIS — N186 End stage renal disease: Secondary | ICD-10-CM | POA: Diagnosis not present

## 2022-02-10 DIAGNOSIS — Z992 Dependence on renal dialysis: Secondary | ICD-10-CM | POA: Diagnosis not present

## 2022-02-10 DIAGNOSIS — K219 Gastro-esophageal reflux disease without esophagitis: Secondary | ICD-10-CM | POA: Diagnosis not present

## 2022-02-10 DIAGNOSIS — I10 Essential (primary) hypertension: Secondary | ICD-10-CM | POA: Diagnosis not present

## 2022-02-11 DIAGNOSIS — D689 Coagulation defect, unspecified: Secondary | ICD-10-CM | POA: Diagnosis not present

## 2022-02-11 DIAGNOSIS — N2581 Secondary hyperparathyroidism of renal origin: Secondary | ICD-10-CM | POA: Diagnosis not present

## 2022-02-11 DIAGNOSIS — E039 Hypothyroidism, unspecified: Secondary | ICD-10-CM | POA: Diagnosis not present

## 2022-02-11 DIAGNOSIS — D631 Anemia in chronic kidney disease: Secondary | ICD-10-CM | POA: Diagnosis not present

## 2022-02-11 DIAGNOSIS — Z992 Dependence on renal dialysis: Secondary | ICD-10-CM | POA: Diagnosis not present

## 2022-02-11 DIAGNOSIS — N186 End stage renal disease: Secondary | ICD-10-CM | POA: Diagnosis not present

## 2022-02-11 DIAGNOSIS — I129 Hypertensive chronic kidney disease with stage 1 through stage 4 chronic kidney disease, or unspecified chronic kidney disease: Secondary | ICD-10-CM | POA: Diagnosis not present

## 2022-02-14 DIAGNOSIS — Z992 Dependence on renal dialysis: Secondary | ICD-10-CM | POA: Diagnosis not present

## 2022-02-14 DIAGNOSIS — N2581 Secondary hyperparathyroidism of renal origin: Secondary | ICD-10-CM | POA: Diagnosis not present

## 2022-02-14 DIAGNOSIS — D689 Coagulation defect, unspecified: Secondary | ICD-10-CM | POA: Diagnosis not present

## 2022-02-14 DIAGNOSIS — N186 End stage renal disease: Secondary | ICD-10-CM | POA: Diagnosis not present

## 2022-02-14 DIAGNOSIS — E039 Hypothyroidism, unspecified: Secondary | ICD-10-CM | POA: Diagnosis not present

## 2022-02-14 DIAGNOSIS — D631 Anemia in chronic kidney disease: Secondary | ICD-10-CM | POA: Diagnosis not present

## 2022-02-16 DIAGNOSIS — N186 End stage renal disease: Secondary | ICD-10-CM | POA: Diagnosis not present

## 2022-02-16 DIAGNOSIS — D689 Coagulation defect, unspecified: Secondary | ICD-10-CM | POA: Diagnosis not present

## 2022-02-16 DIAGNOSIS — D631 Anemia in chronic kidney disease: Secondary | ICD-10-CM | POA: Diagnosis not present

## 2022-02-16 DIAGNOSIS — E039 Hypothyroidism, unspecified: Secondary | ICD-10-CM | POA: Diagnosis not present

## 2022-02-16 DIAGNOSIS — Z95828 Presence of other vascular implants and grafts: Secondary | ICD-10-CM | POA: Diagnosis not present

## 2022-02-16 DIAGNOSIS — Z48817 Encounter for surgical aftercare following surgery on the skin and subcutaneous tissue: Secondary | ICD-10-CM | POA: Diagnosis not present

## 2022-02-16 DIAGNOSIS — N2581 Secondary hyperparathyroidism of renal origin: Secondary | ICD-10-CM | POA: Diagnosis not present

## 2022-02-16 DIAGNOSIS — Z7901 Long term (current) use of anticoagulants: Secondary | ICD-10-CM | POA: Diagnosis not present

## 2022-02-16 DIAGNOSIS — Z992 Dependence on renal dialysis: Secondary | ICD-10-CM | POA: Diagnosis not present

## 2022-02-18 DIAGNOSIS — Z992 Dependence on renal dialysis: Secondary | ICD-10-CM | POA: Diagnosis not present

## 2022-02-18 DIAGNOSIS — D689 Coagulation defect, unspecified: Secondary | ICD-10-CM | POA: Diagnosis not present

## 2022-02-18 DIAGNOSIS — N186 End stage renal disease: Secondary | ICD-10-CM | POA: Diagnosis not present

## 2022-02-18 DIAGNOSIS — E039 Hypothyroidism, unspecified: Secondary | ICD-10-CM | POA: Diagnosis not present

## 2022-02-18 DIAGNOSIS — N2581 Secondary hyperparathyroidism of renal origin: Secondary | ICD-10-CM | POA: Diagnosis not present

## 2022-02-18 DIAGNOSIS — D631 Anemia in chronic kidney disease: Secondary | ICD-10-CM | POA: Diagnosis not present

## 2022-02-20 ENCOUNTER — Other Ambulatory Visit: Payer: Medicare Other | Admitting: Urology

## 2022-02-21 DIAGNOSIS — Z992 Dependence on renal dialysis: Secondary | ICD-10-CM | POA: Diagnosis not present

## 2022-02-21 DIAGNOSIS — N2581 Secondary hyperparathyroidism of renal origin: Secondary | ICD-10-CM | POA: Diagnosis not present

## 2022-02-21 DIAGNOSIS — D631 Anemia in chronic kidney disease: Secondary | ICD-10-CM | POA: Diagnosis not present

## 2022-02-21 DIAGNOSIS — N186 End stage renal disease: Secondary | ICD-10-CM | POA: Diagnosis not present

## 2022-02-21 DIAGNOSIS — D689 Coagulation defect, unspecified: Secondary | ICD-10-CM | POA: Diagnosis not present

## 2022-02-21 DIAGNOSIS — E039 Hypothyroidism, unspecified: Secondary | ICD-10-CM | POA: Diagnosis not present

## 2022-02-23 DIAGNOSIS — Z992 Dependence on renal dialysis: Secondary | ICD-10-CM | POA: Diagnosis not present

## 2022-02-23 DIAGNOSIS — N186 End stage renal disease: Secondary | ICD-10-CM | POA: Diagnosis not present

## 2022-02-23 DIAGNOSIS — D689 Coagulation defect, unspecified: Secondary | ICD-10-CM | POA: Diagnosis not present

## 2022-02-23 DIAGNOSIS — N2581 Secondary hyperparathyroidism of renal origin: Secondary | ICD-10-CM | POA: Diagnosis not present

## 2022-02-23 DIAGNOSIS — D631 Anemia in chronic kidney disease: Secondary | ICD-10-CM | POA: Diagnosis not present

## 2022-02-23 DIAGNOSIS — E039 Hypothyroidism, unspecified: Secondary | ICD-10-CM | POA: Diagnosis not present

## 2022-02-25 DIAGNOSIS — E039 Hypothyroidism, unspecified: Secondary | ICD-10-CM | POA: Diagnosis not present

## 2022-02-25 DIAGNOSIS — N186 End stage renal disease: Secondary | ICD-10-CM | POA: Diagnosis not present

## 2022-02-25 DIAGNOSIS — Z992 Dependence on renal dialysis: Secondary | ICD-10-CM | POA: Diagnosis not present

## 2022-02-25 DIAGNOSIS — N2581 Secondary hyperparathyroidism of renal origin: Secondary | ICD-10-CM | POA: Diagnosis not present

## 2022-02-25 DIAGNOSIS — D689 Coagulation defect, unspecified: Secondary | ICD-10-CM | POA: Diagnosis not present

## 2022-02-25 DIAGNOSIS — D631 Anemia in chronic kidney disease: Secondary | ICD-10-CM | POA: Diagnosis not present

## 2022-02-27 DIAGNOSIS — R0981 Nasal congestion: Secondary | ICD-10-CM | POA: Diagnosis not present

## 2022-02-28 DIAGNOSIS — E039 Hypothyroidism, unspecified: Secondary | ICD-10-CM | POA: Diagnosis not present

## 2022-02-28 DIAGNOSIS — Z992 Dependence on renal dialysis: Secondary | ICD-10-CM | POA: Diagnosis not present

## 2022-02-28 DIAGNOSIS — D689 Coagulation defect, unspecified: Secondary | ICD-10-CM | POA: Diagnosis not present

## 2022-02-28 DIAGNOSIS — D631 Anemia in chronic kidney disease: Secondary | ICD-10-CM | POA: Diagnosis not present

## 2022-02-28 DIAGNOSIS — N186 End stage renal disease: Secondary | ICD-10-CM | POA: Diagnosis not present

## 2022-02-28 DIAGNOSIS — N2581 Secondary hyperparathyroidism of renal origin: Secondary | ICD-10-CM | POA: Diagnosis not present

## 2022-03-02 DIAGNOSIS — D631 Anemia in chronic kidney disease: Secondary | ICD-10-CM | POA: Diagnosis not present

## 2022-03-02 DIAGNOSIS — N2581 Secondary hyperparathyroidism of renal origin: Secondary | ICD-10-CM | POA: Diagnosis not present

## 2022-03-02 DIAGNOSIS — N186 End stage renal disease: Secondary | ICD-10-CM | POA: Diagnosis not present

## 2022-03-02 DIAGNOSIS — D689 Coagulation defect, unspecified: Secondary | ICD-10-CM | POA: Diagnosis not present

## 2022-03-02 DIAGNOSIS — Z992 Dependence on renal dialysis: Secondary | ICD-10-CM | POA: Diagnosis not present

## 2022-03-02 DIAGNOSIS — E039 Hypothyroidism, unspecified: Secondary | ICD-10-CM | POA: Diagnosis not present

## 2022-03-04 DIAGNOSIS — Z992 Dependence on renal dialysis: Secondary | ICD-10-CM | POA: Diagnosis not present

## 2022-03-04 DIAGNOSIS — R404 Transient alteration of awareness: Secondary | ICD-10-CM | POA: Diagnosis not present

## 2022-03-04 DIAGNOSIS — I499 Cardiac arrhythmia, unspecified: Secondary | ICD-10-CM | POA: Diagnosis not present

## 2022-03-04 DIAGNOSIS — I469 Cardiac arrest, cause unspecified: Secondary | ICD-10-CM | POA: Diagnosis not present

## 2022-03-04 DIAGNOSIS — Z743 Need for continuous supervision: Secondary | ICD-10-CM | POA: Diagnosis not present

## 2022-03-04 DIAGNOSIS — R4020 Unspecified coma: Secondary | ICD-10-CM | POA: Diagnosis not present

## 2022-03-04 DIAGNOSIS — D631 Anemia in chronic kidney disease: Secondary | ICD-10-CM | POA: Diagnosis not present

## 2022-03-04 DIAGNOSIS — N186 End stage renal disease: Secondary | ICD-10-CM | POA: Diagnosis not present

## 2022-03-04 DIAGNOSIS — D689 Coagulation defect, unspecified: Secondary | ICD-10-CM | POA: Diagnosis not present

## 2022-03-04 DIAGNOSIS — E039 Hypothyroidism, unspecified: Secondary | ICD-10-CM | POA: Diagnosis not present

## 2022-03-04 DIAGNOSIS — N2581 Secondary hyperparathyroidism of renal origin: Secondary | ICD-10-CM | POA: Diagnosis not present

## 2022-03-13 DEATH — deceased

## 2022-03-17 ENCOUNTER — Other Ambulatory Visit: Payer: Medicare Other | Admitting: Urology

## 2022-04-14 ENCOUNTER — Ambulatory Visit: Payer: Medicare Other | Admitting: Student

## 2022-04-21 ENCOUNTER — Other Ambulatory Visit: Payer: Medicare Other

## 2022-04-24 ENCOUNTER — Ambulatory Visit: Payer: Medicare Other | Admitting: Internal Medicine

## 2022-06-05 ENCOUNTER — Other Ambulatory Visit: Payer: Self-pay

## 2023-06-26 ENCOUNTER — Encounter: Payer: Self-pay | Admitting: *Deleted

## 2023-08-16 ENCOUNTER — Other Ambulatory Visit: Payer: Self-pay | Admitting: Physician Assistant

## 2023-10-03 NOTE — Telephone Encounter (Signed)
Telephone call
